# Patient Record
Sex: Female | Born: 1950 | Race: Black or African American | Hispanic: No | Smoking: Former smoker
Health system: Southern US, Community
[De-identification: ages and names within clinical notes are randomized; demographics above are authoritative.]

## PROBLEM LIST (undated history)

## (undated) ENCOUNTER — Emergency Department (HOSPITAL_COMMUNITY): Discharge status: Absent without leave | Payer: Medicare Other

## (undated) DIAGNOSIS — I313 Pericardial effusion (noninflammatory): Secondary | ICD-10-CM

## (undated) DIAGNOSIS — G4733 Obstructive sleep apnea (adult) (pediatric): Secondary | ICD-10-CM

## (undated) DIAGNOSIS — G4700 Insomnia, unspecified: Secondary | ICD-10-CM

## (undated) DIAGNOSIS — F329 Major depressive disorder, single episode, unspecified: Secondary | ICD-10-CM

## (undated) DIAGNOSIS — E039 Hypothyroidism, unspecified: Secondary | ICD-10-CM

## (undated) DIAGNOSIS — E876 Hypokalemia: Secondary | ICD-10-CM

## (undated) DIAGNOSIS — K648 Other hemorrhoids: Secondary | ICD-10-CM

## (undated) DIAGNOSIS — I421 Obstructive hypertrophic cardiomyopathy: Secondary | ICD-10-CM

## (undated) DIAGNOSIS — D649 Anemia, unspecified: Secondary | ICD-10-CM

## (undated) DIAGNOSIS — I3139 Other pericardial effusion (noninflammatory): Secondary | ICD-10-CM

## (undated) DIAGNOSIS — M1990 Unspecified osteoarthritis, unspecified site: Secondary | ICD-10-CM

## (undated) DIAGNOSIS — J9801 Acute bronchospasm: Secondary | ICD-10-CM

## (undated) DIAGNOSIS — H409 Unspecified glaucoma: Secondary | ICD-10-CM

## (undated) DIAGNOSIS — J449 Chronic obstructive pulmonary disease, unspecified: Secondary | ICD-10-CM

## (undated) DIAGNOSIS — I10 Essential (primary) hypertension: Secondary | ICD-10-CM

## (undated) DIAGNOSIS — K5790 Diverticulosis of intestine, part unspecified, without perforation or abscess without bleeding: Secondary | ICD-10-CM

## (undated) DIAGNOSIS — K219 Gastro-esophageal reflux disease without esophagitis: Secondary | ICD-10-CM

## (undated) DIAGNOSIS — N390 Urinary tract infection, site not specified: Secondary | ICD-10-CM

## (undated) DIAGNOSIS — E119 Type 2 diabetes mellitus without complications: Secondary | ICD-10-CM

## (undated) DIAGNOSIS — M4310 Spondylolisthesis, site unspecified: Secondary | ICD-10-CM

## (undated) DIAGNOSIS — I5030 Unspecified diastolic (congestive) heart failure: Secondary | ICD-10-CM

## (undated) DIAGNOSIS — M2550 Pain in unspecified joint: Secondary | ICD-10-CM

## (undated) DIAGNOSIS — I319 Disease of pericardium, unspecified: Secondary | ICD-10-CM

## (undated) DIAGNOSIS — E785 Hyperlipidemia, unspecified: Secondary | ICD-10-CM

## (undated) HISTORY — DX: Essential (primary) hypertension: I10

## (undated) HISTORY — DX: Type 2 diabetes mellitus without complications: E11.9

## (undated) HISTORY — DX: Major depressive disorder, single episode, unspecified: F32.9

## (undated) HISTORY — DX: Hypothyroidism, unspecified: E03.9

## (undated) HISTORY — DX: Other pericardial effusion (noninflammatory): I31.39

## (undated) HISTORY — DX: Hyperlipidemia, unspecified: E78.5

## (undated) HISTORY — DX: Disease of pericardium, unspecified: I31.9

## (undated) HISTORY — DX: Acute bronchospasm: J98.01

## (undated) HISTORY — DX: Other hemorrhoids: K64.8

## (undated) HISTORY — DX: Diverticulosis of intestine, part unspecified, without perforation or abscess without bleeding: K57.90

## (undated) HISTORY — DX: Obstructive sleep apnea (adult) (pediatric): G47.33

## (undated) HISTORY — DX: Obstructive hypertrophic cardiomyopathy: I42.1

## (undated) HISTORY — DX: Gastro-esophageal reflux disease without esophagitis: K21.9

## (undated) HISTORY — DX: Spondylolisthesis, site unspecified: M43.10

## (undated) HISTORY — DX: Hypokalemia: E87.6

## (undated) HISTORY — DX: Insomnia, unspecified: G47.00

## (undated) HISTORY — DX: Unspecified diastolic (congestive) heart failure: I50.30

## (undated) HISTORY — DX: Hypomagnesemia: E83.42

## (undated) HISTORY — DX: Anemia, unspecified: D64.9

## (undated) HISTORY — DX: Pain in unspecified joint: M25.50

## (undated) HISTORY — DX: Pericardial effusion (noninflammatory): I31.3

## (undated) HISTORY — DX: Unspecified osteoarthritis, unspecified site: M19.90

---

## 2002-12-07 ENCOUNTER — Ambulatory Visit (HOSPITAL_COMMUNITY): Admission: RE | Admit: 2002-12-07 | Discharge: 2002-12-07 | Payer: Self-pay | Admitting: *Deleted

## 2003-02-16 ENCOUNTER — Encounter: Payer: Self-pay | Admitting: Internal Medicine

## 2003-02-16 ENCOUNTER — Encounter: Admission: RE | Admit: 2003-02-16 | Discharge: 2003-02-16 | Payer: Self-pay | Admitting: Internal Medicine

## 2003-03-15 ENCOUNTER — Ambulatory Visit (HOSPITAL_BASED_OUTPATIENT_CLINIC_OR_DEPARTMENT_OTHER): Admission: RE | Admit: 2003-03-15 | Discharge: 2003-03-15 | Payer: Self-pay | Admitting: Orthopedic Surgery

## 2003-03-15 ENCOUNTER — Ambulatory Visit (HOSPITAL_COMMUNITY): Admission: RE | Admit: 2003-03-15 | Discharge: 2003-03-15 | Payer: Self-pay | Admitting: Orthopedic Surgery

## 2003-05-13 HISTORY — PX: OTHER SURGICAL HISTORY: SHX169

## 2003-05-25 ENCOUNTER — Encounter: Payer: Self-pay | Admitting: Internal Medicine

## 2003-07-10 ENCOUNTER — Encounter: Admission: RE | Admit: 2003-07-10 | Discharge: 2003-07-10 | Payer: Self-pay | Admitting: Internal Medicine

## 2003-10-20 ENCOUNTER — Encounter: Admission: RE | Admit: 2003-10-20 | Discharge: 2003-10-20 | Payer: Self-pay | Admitting: Internal Medicine

## 2003-11-22 ENCOUNTER — Inpatient Hospital Stay (HOSPITAL_COMMUNITY): Admission: RE | Admit: 2003-11-22 | Discharge: 2003-11-27 | Payer: Self-pay | Admitting: Orthopedic Surgery

## 2003-11-27 ENCOUNTER — Inpatient Hospital Stay (HOSPITAL_COMMUNITY)
Admission: RE | Admit: 2003-11-27 | Discharge: 2003-12-06 | Payer: Self-pay | Admitting: Physical Medicine & Rehabilitation

## 2004-01-04 ENCOUNTER — Encounter: Admission: RE | Admit: 2004-01-04 | Discharge: 2004-03-28 | Payer: Self-pay | Admitting: Orthopedic Surgery

## 2004-03-14 ENCOUNTER — Ambulatory Visit (HOSPITAL_COMMUNITY): Admission: RE | Admit: 2004-03-14 | Discharge: 2004-03-14 | Payer: Self-pay | Admitting: Internal Medicine

## 2004-03-14 ENCOUNTER — Ambulatory Visit: Payer: Self-pay | Admitting: Internal Medicine

## 2004-04-05 ENCOUNTER — Encounter: Admission: RE | Admit: 2004-04-05 | Discharge: 2004-04-05 | Payer: Self-pay | Admitting: Internal Medicine

## 2004-04-16 ENCOUNTER — Ambulatory Visit: Payer: Self-pay | Admitting: Internal Medicine

## 2004-06-06 ENCOUNTER — Ambulatory Visit: Payer: Self-pay | Admitting: Internal Medicine

## 2004-06-10 ENCOUNTER — Encounter: Admission: RE | Admit: 2004-06-10 | Discharge: 2004-06-10 | Payer: Self-pay | Admitting: Internal Medicine

## 2004-08-14 ENCOUNTER — Ambulatory Visit: Payer: Self-pay | Admitting: Internal Medicine

## 2004-08-20 ENCOUNTER — Ambulatory Visit: Payer: Self-pay | Admitting: Internal Medicine

## 2004-09-03 ENCOUNTER — Ambulatory Visit: Payer: Self-pay | Admitting: Internal Medicine

## 2004-10-22 ENCOUNTER — Ambulatory Visit: Payer: Self-pay | Admitting: Internal Medicine

## 2004-11-01 ENCOUNTER — Encounter: Payer: Self-pay | Admitting: Internal Medicine

## 2005-01-01 ENCOUNTER — Ambulatory Visit: Payer: Self-pay | Admitting: Internal Medicine

## 2005-01-30 ENCOUNTER — Encounter: Admission: RE | Admit: 2005-01-30 | Discharge: 2005-01-30 | Payer: Self-pay | Admitting: Internal Medicine

## 2005-02-17 ENCOUNTER — Ambulatory Visit: Payer: Self-pay | Admitting: Internal Medicine

## 2005-02-25 ENCOUNTER — Ambulatory Visit: Payer: Self-pay | Admitting: Cardiology

## 2005-03-11 ENCOUNTER — Encounter: Payer: Self-pay | Admitting: Internal Medicine

## 2005-03-11 ENCOUNTER — Ambulatory Visit: Payer: Self-pay

## 2005-03-13 ENCOUNTER — Ambulatory Visit: Payer: Self-pay

## 2005-04-01 ENCOUNTER — Encounter: Payer: Self-pay | Admitting: Internal Medicine

## 2005-04-01 ENCOUNTER — Ambulatory Visit: Payer: Self-pay | Admitting: Cardiology

## 2005-05-02 ENCOUNTER — Encounter: Admission: RE | Admit: 2005-05-02 | Discharge: 2005-05-02 | Payer: Self-pay | Admitting: Internal Medicine

## 2005-05-20 ENCOUNTER — Ambulatory Visit: Payer: Self-pay | Admitting: Internal Medicine

## 2005-05-23 ENCOUNTER — Ambulatory Visit: Payer: Self-pay | Admitting: Internal Medicine

## 2005-05-26 ENCOUNTER — Other Ambulatory Visit: Admission: RE | Admit: 2005-05-26 | Discharge: 2005-05-26 | Payer: Self-pay | Admitting: Family Medicine

## 2005-05-26 ENCOUNTER — Ambulatory Visit: Payer: Self-pay | Admitting: Family Medicine

## 2005-08-21 ENCOUNTER — Ambulatory Visit: Payer: Self-pay | Admitting: Internal Medicine

## 2005-09-10 ENCOUNTER — Encounter: Admission: RE | Admit: 2005-09-10 | Discharge: 2005-09-10 | Payer: Self-pay | Admitting: Internal Medicine

## 2005-09-11 ENCOUNTER — Encounter: Payer: Self-pay | Admitting: Internal Medicine

## 2005-09-18 ENCOUNTER — Encounter: Admission: RE | Admit: 2005-09-18 | Discharge: 2005-09-18 | Payer: Self-pay | Admitting: Family Medicine

## 2005-11-20 ENCOUNTER — Encounter: Payer: Self-pay | Admitting: Internal Medicine

## 2005-12-01 ENCOUNTER — Ambulatory Visit: Payer: Self-pay | Admitting: Internal Medicine

## 2005-12-19 ENCOUNTER — Ambulatory Visit: Payer: Self-pay | Admitting: Endocrinology

## 2006-01-02 ENCOUNTER — Ambulatory Visit (HOSPITAL_COMMUNITY): Admission: RE | Admit: 2006-01-02 | Discharge: 2006-01-02 | Payer: Self-pay | Admitting: Endocrinology

## 2006-01-02 ENCOUNTER — Ambulatory Visit: Payer: Self-pay | Admitting: Endocrinology

## 2006-01-14 ENCOUNTER — Ambulatory Visit: Payer: Self-pay | Admitting: Endocrinology

## 2006-02-13 ENCOUNTER — Ambulatory Visit: Payer: Self-pay | Admitting: Endocrinology

## 2006-02-24 ENCOUNTER — Ambulatory Visit: Payer: Self-pay | Admitting: Endocrinology

## 2006-04-07 ENCOUNTER — Ambulatory Visit: Payer: Self-pay | Admitting: Endocrinology

## 2006-04-28 ENCOUNTER — Encounter: Admission: RE | Admit: 2006-04-28 | Discharge: 2006-04-28 | Payer: Self-pay | Admitting: Specialist

## 2006-05-07 ENCOUNTER — Encounter: Admission: RE | Admit: 2006-05-07 | Discharge: 2006-05-07 | Payer: Self-pay | Admitting: Internal Medicine

## 2006-05-13 ENCOUNTER — Ambulatory Visit: Payer: Self-pay | Admitting: Internal Medicine

## 2006-05-20 ENCOUNTER — Ambulatory Visit: Payer: Self-pay | Admitting: Endocrinology

## 2006-05-25 ENCOUNTER — Ambulatory Visit: Payer: Self-pay | Admitting: Internal Medicine

## 2006-06-08 ENCOUNTER — Ambulatory Visit: Payer: Self-pay | Admitting: Internal Medicine

## 2006-06-18 ENCOUNTER — Encounter: Admission: RE | Admit: 2006-06-18 | Discharge: 2006-06-18 | Payer: Self-pay | Admitting: Internal Medicine

## 2006-06-30 ENCOUNTER — Ambulatory Visit: Payer: Self-pay | Admitting: Endocrinology

## 2006-07-28 ENCOUNTER — Ambulatory Visit: Payer: Self-pay | Admitting: Internal Medicine

## 2006-08-11 ENCOUNTER — Ambulatory Visit: Payer: Self-pay | Admitting: Internal Medicine

## 2006-08-11 LAB — CONVERTED CEMR LAB
Basophils Absolute: 0 10*3/uL (ref 0.0–0.1)
Basophils Relative: 0.6 % (ref 0.0–1.0)
CO2: 30 meq/L (ref 19–32)
Cholesterol: 149 mg/dL (ref 0–200)
Creatinine, Ser: 0.8 mg/dL (ref 0.4–1.2)
Glucose, Bld: 192 mg/dL — ABNORMAL HIGH (ref 70–99)
HCT: 40.5 % (ref 36.0–46.0)
Hemoglobin: 13.5 g/dL (ref 12.0–15.0)
LDL Cholesterol: 77 mg/dL (ref 0–99)
MCHC: 33.4 g/dL (ref 30.0–36.0)
Monocytes Absolute: 0.4 10*3/uL (ref 0.2–0.7)
Neutrophils Relative %: 58.7 % (ref 43.0–77.0)
Potassium: 4 meq/L (ref 3.5–5.1)
RDW: 14 % (ref 11.5–14.6)
Sodium: 139 meq/L (ref 135–145)

## 2006-08-12 ENCOUNTER — Ambulatory Visit: Payer: Self-pay | Admitting: Endocrinology

## 2006-08-17 ENCOUNTER — Encounter (INDEPENDENT_AMBULATORY_CARE_PROVIDER_SITE_OTHER): Payer: Self-pay | Admitting: *Deleted

## 2006-08-17 ENCOUNTER — Ambulatory Visit: Payer: Self-pay | Admitting: Internal Medicine

## 2006-08-28 ENCOUNTER — Encounter: Payer: Self-pay | Admitting: Internal Medicine

## 2006-08-28 ENCOUNTER — Ambulatory Visit: Payer: Self-pay | Admitting: Internal Medicine

## 2006-09-02 ENCOUNTER — Encounter: Payer: Self-pay | Admitting: Family Medicine

## 2006-09-02 ENCOUNTER — Other Ambulatory Visit: Admission: RE | Admit: 2006-09-02 | Discharge: 2006-09-02 | Payer: Self-pay | Admitting: Family Medicine

## 2006-09-02 ENCOUNTER — Ambulatory Visit: Payer: Self-pay | Admitting: Family Medicine

## 2006-09-04 ENCOUNTER — Encounter: Payer: Self-pay | Admitting: Internal Medicine

## 2006-09-21 ENCOUNTER — Ambulatory Visit: Payer: Self-pay | Admitting: Gastroenterology

## 2006-10-06 ENCOUNTER — Ambulatory Visit: Payer: Self-pay | Admitting: Gastroenterology

## 2006-10-06 ENCOUNTER — Encounter: Payer: Self-pay | Admitting: Family Medicine

## 2006-10-06 DIAGNOSIS — I10 Essential (primary) hypertension: Secondary | ICD-10-CM | POA: Insufficient documentation

## 2006-10-06 DIAGNOSIS — M1990 Unspecified osteoarthritis, unspecified site: Secondary | ICD-10-CM

## 2006-10-06 DIAGNOSIS — E119 Type 2 diabetes mellitus without complications: Secondary | ICD-10-CM

## 2006-10-06 HISTORY — DX: Type 2 diabetes mellitus without complications: E11.9

## 2006-10-06 HISTORY — DX: Unspecified osteoarthritis, unspecified site: M19.90

## 2006-10-06 HISTORY — DX: Essential (primary) hypertension: I10

## 2006-10-06 LAB — HM COLONOSCOPY

## 2006-10-17 ENCOUNTER — Encounter: Payer: Self-pay | Admitting: Family Medicine

## 2006-10-17 ENCOUNTER — Emergency Department (HOSPITAL_COMMUNITY): Admission: EM | Admit: 2006-10-17 | Discharge: 2006-10-17 | Payer: Self-pay | Admitting: Emergency Medicine

## 2006-10-20 ENCOUNTER — Ambulatory Visit: Payer: Self-pay | Admitting: Endocrinology

## 2006-11-10 ENCOUNTER — Ambulatory Visit: Payer: Self-pay | Admitting: Internal Medicine

## 2006-11-10 DIAGNOSIS — M2550 Pain in unspecified joint: Secondary | ICD-10-CM

## 2006-11-10 HISTORY — DX: Pain in unspecified joint: M25.50

## 2006-11-10 LAB — CONVERTED CEMR LAB
ANA Titer 1: 1:40 {titer} — ABNORMAL HIGH
Anti Nuclear Antibody(ANA): POSITIVE — AB

## 2006-11-18 ENCOUNTER — Ambulatory Visit: Payer: Self-pay | Admitting: Endocrinology

## 2006-11-19 ENCOUNTER — Encounter: Payer: Self-pay | Admitting: Internal Medicine

## 2006-11-19 ENCOUNTER — Encounter: Admission: RE | Admit: 2006-11-19 | Discharge: 2007-02-17 | Payer: Self-pay | Admitting: Internal Medicine

## 2006-11-20 LAB — CONVERTED CEMR LAB: TSH: 2.17 microintl units/mL (ref 0.35–5.50)

## 2006-11-26 ENCOUNTER — Encounter: Payer: Self-pay | Admitting: Internal Medicine

## 2006-12-21 ENCOUNTER — Ambulatory Visit: Payer: Self-pay | Admitting: Endocrinology

## 2006-12-21 LAB — CONVERTED CEMR LAB: Hgb A1c MFr Bld: 7.2 % — ABNORMAL HIGH (ref 4.6–6.0)

## 2007-02-12 ENCOUNTER — Encounter: Payer: Self-pay | Admitting: Internal Medicine

## 2007-03-15 ENCOUNTER — Ambulatory Visit: Payer: Self-pay | Admitting: Internal Medicine

## 2007-03-16 LAB — CONVERTED CEMR LAB
BUN: 11 mg/dL (ref 6–23)
CO2: 28 meq/L (ref 19–32)
Calcium: 9.8 mg/dL (ref 8.4–10.5)
Chloride: 103 meq/L (ref 96–112)
Creatinine, Ser: 0.9 mg/dL (ref 0.4–1.2)
GFR calc Af Amer: 83 mL/min
GFR calc non Af Amer: 69 mL/min
Glucose, Bld: 59 mg/dL — ABNORMAL LOW (ref 70–99)
Potassium: 3.2 meq/L — ABNORMAL LOW (ref 3.5–5.1)
Sodium: 141 meq/L (ref 135–145)

## 2007-04-05 ENCOUNTER — Ambulatory Visit: Payer: Self-pay | Admitting: Internal Medicine

## 2007-04-06 LAB — CONVERTED CEMR LAB: Potassium: 3.3 meq/L — ABNORMAL LOW (ref 3.5–5.1)

## 2007-04-20 ENCOUNTER — Ambulatory Visit: Payer: Self-pay | Admitting: Internal Medicine

## 2007-04-26 LAB — CONVERTED CEMR LAB
Creatinine, Ser: 1 mg/dL (ref 0.4–1.2)
GFR calc non Af Amer: 61 mL/min
Glucose, Bld: 88 mg/dL (ref 70–99)
Potassium: 3.5 meq/L (ref 3.5–5.1)
Sodium: 137 meq/L (ref 135–145)

## 2007-05-03 ENCOUNTER — Ambulatory Visit: Payer: Self-pay | Admitting: Endocrinology

## 2007-05-03 LAB — CONVERTED CEMR LAB: Hgb A1c MFr Bld: 6.3 % — ABNORMAL HIGH (ref 4.6–6.0)

## 2007-05-10 ENCOUNTER — Encounter: Admission: RE | Admit: 2007-05-10 | Discharge: 2007-05-10 | Payer: Self-pay | Admitting: Internal Medicine

## 2007-06-07 ENCOUNTER — Ambulatory Visit: Payer: Self-pay | Admitting: Internal Medicine

## 2007-06-23 ENCOUNTER — Encounter: Admission: RE | Admit: 2007-06-23 | Discharge: 2007-07-14 | Payer: Self-pay | Admitting: Orthopedic Surgery

## 2007-07-09 ENCOUNTER — Ambulatory Visit: Payer: Self-pay | Admitting: Endocrinology

## 2007-07-11 HISTORY — PX: OTHER SURGICAL HISTORY: SHX169

## 2007-07-14 ENCOUNTER — Telehealth: Payer: Self-pay | Admitting: Internal Medicine

## 2007-07-20 ENCOUNTER — Inpatient Hospital Stay (HOSPITAL_COMMUNITY): Admission: RE | Admit: 2007-07-20 | Discharge: 2007-07-27 | Payer: Self-pay | Admitting: Orthopedic Surgery

## 2007-08-06 ENCOUNTER — Encounter: Payer: Self-pay | Admitting: Internal Medicine

## 2007-09-06 ENCOUNTER — Encounter: Payer: Self-pay | Admitting: Internal Medicine

## 2007-09-13 ENCOUNTER — Encounter: Admission: RE | Admit: 2007-09-13 | Discharge: 2007-12-07 | Payer: Self-pay | Admitting: Orthopedic Surgery

## 2007-09-27 ENCOUNTER — Encounter: Payer: Self-pay | Admitting: Internal Medicine

## 2007-10-12 ENCOUNTER — Encounter: Payer: Self-pay | Admitting: Internal Medicine

## 2007-11-01 ENCOUNTER — Telehealth (INDEPENDENT_AMBULATORY_CARE_PROVIDER_SITE_OTHER): Payer: Self-pay | Admitting: *Deleted

## 2007-11-01 ENCOUNTER — Ambulatory Visit: Payer: Self-pay | Admitting: Endocrinology

## 2007-11-18 ENCOUNTER — Ambulatory Visit: Payer: Self-pay | Admitting: Internal Medicine

## 2007-11-18 DIAGNOSIS — R079 Chest pain, unspecified: Secondary | ICD-10-CM

## 2007-11-22 ENCOUNTER — Ambulatory Visit: Payer: Self-pay | Admitting: Internal Medicine

## 2007-11-23 ENCOUNTER — Encounter (INDEPENDENT_AMBULATORY_CARE_PROVIDER_SITE_OTHER): Payer: Self-pay | Admitting: *Deleted

## 2007-12-07 ENCOUNTER — Ambulatory Visit: Payer: Self-pay | Admitting: Internal Medicine

## 2007-12-07 ENCOUNTER — Ambulatory Visit: Payer: Self-pay | Admitting: Cardiology

## 2007-12-08 ENCOUNTER — Telehealth (INDEPENDENT_AMBULATORY_CARE_PROVIDER_SITE_OTHER): Payer: Self-pay | Admitting: *Deleted

## 2007-12-08 ENCOUNTER — Inpatient Hospital Stay (HOSPITAL_COMMUNITY): Admission: EM | Admit: 2007-12-08 | Discharge: 2007-12-09 | Payer: Self-pay | Admitting: Emergency Medicine

## 2007-12-08 ENCOUNTER — Encounter: Payer: Self-pay | Admitting: Internal Medicine

## 2007-12-09 ENCOUNTER — Encounter: Payer: Self-pay | Admitting: Internal Medicine

## 2007-12-15 ENCOUNTER — Encounter (INDEPENDENT_AMBULATORY_CARE_PROVIDER_SITE_OTHER): Payer: Self-pay | Admitting: *Deleted

## 2007-12-15 ENCOUNTER — Ambulatory Visit: Payer: Self-pay | Admitting: Internal Medicine

## 2007-12-17 ENCOUNTER — Ambulatory Visit: Payer: Self-pay | Admitting: Cardiology

## 2007-12-21 ENCOUNTER — Telehealth: Payer: Self-pay | Admitting: Internal Medicine

## 2007-12-30 ENCOUNTER — Telehealth: Payer: Self-pay | Admitting: Internal Medicine

## 2008-01-03 ENCOUNTER — Encounter: Admission: RE | Admit: 2008-01-03 | Discharge: 2008-01-03 | Payer: Self-pay | Admitting: Internal Medicine

## 2008-01-05 ENCOUNTER — Telehealth (INDEPENDENT_AMBULATORY_CARE_PROVIDER_SITE_OTHER): Payer: Self-pay | Admitting: *Deleted

## 2008-01-06 ENCOUNTER — Encounter: Payer: Self-pay | Admitting: Internal Medicine

## 2008-01-06 ENCOUNTER — Ambulatory Visit: Payer: Self-pay

## 2008-01-11 ENCOUNTER — Ambulatory Visit: Payer: Self-pay

## 2008-01-25 ENCOUNTER — Ambulatory Visit: Payer: Self-pay | Admitting: Endocrinology

## 2008-01-26 ENCOUNTER — Ambulatory Visit: Payer: Self-pay | Admitting: Cardiovascular Disease

## 2008-01-27 ENCOUNTER — Ambulatory Visit: Payer: Self-pay | Admitting: Cardiology

## 2008-01-27 LAB — CONVERTED CEMR LAB
BUN: 16 mg/dL (ref 6–23)
Basophils Absolute: 0 10*3/uL (ref 0.0–0.1)
Basophils Relative: 0.7 % (ref 0.0–3.0)
CO2: 29 meq/L (ref 19–32)
Calcium: 9.5 mg/dL (ref 8.4–10.5)
Chloride: 99 meq/L (ref 96–112)
Creatinine, Ser: 0.8 mg/dL (ref 0.4–1.2)
Eosinophils Relative: 2.4 % (ref 0.0–5.0)
Glucose, Bld: 79 mg/dL (ref 70–99)
Hemoglobin: 12.8 g/dL (ref 12.0–15.0)
Lymphocytes Relative: 32.7 % (ref 12.0–46.0)
MCHC: 33.2 g/dL (ref 30.0–36.0)
Neutro Abs: 3.5 10*3/uL (ref 1.4–7.7)
Prothrombin Time: 12 s (ref 10.9–13.3)
RBC: 4.26 M/uL (ref 3.87–5.11)
WBC: 6.1 10*3/uL (ref 4.5–10.5)

## 2008-01-28 ENCOUNTER — Ambulatory Visit: Payer: Self-pay | Admitting: Internal Medicine

## 2008-02-01 ENCOUNTER — Ambulatory Visit: Payer: Self-pay | Admitting: Cardiovascular Disease

## 2008-02-01 ENCOUNTER — Ambulatory Visit (HOSPITAL_COMMUNITY): Admission: RE | Admit: 2008-02-01 | Discharge: 2008-02-01 | Payer: Self-pay | Admitting: Cardiovascular Disease

## 2008-02-09 ENCOUNTER — Telehealth (INDEPENDENT_AMBULATORY_CARE_PROVIDER_SITE_OTHER): Payer: Self-pay | Admitting: *Deleted

## 2008-02-10 ENCOUNTER — Encounter: Payer: Self-pay | Admitting: Internal Medicine

## 2008-02-11 ENCOUNTER — Ambulatory Visit: Payer: Self-pay | Admitting: Internal Medicine

## 2008-03-17 ENCOUNTER — Ambulatory Visit: Payer: Self-pay | Admitting: Cardiology

## 2008-04-11 ENCOUNTER — Telehealth (INDEPENDENT_AMBULATORY_CARE_PROVIDER_SITE_OTHER): Payer: Self-pay | Admitting: *Deleted

## 2008-05-08 ENCOUNTER — Encounter (INDEPENDENT_AMBULATORY_CARE_PROVIDER_SITE_OTHER): Payer: Self-pay | Admitting: *Deleted

## 2008-05-23 ENCOUNTER — Ambulatory Visit: Payer: Self-pay | Admitting: Endocrinology

## 2008-05-23 LAB — CONVERTED CEMR LAB: Vitamin B-12: 351 pg/mL (ref 211–911)

## 2008-05-26 ENCOUNTER — Telehealth (INDEPENDENT_AMBULATORY_CARE_PROVIDER_SITE_OTHER): Payer: Self-pay | Admitting: *Deleted

## 2008-05-29 ENCOUNTER — Ambulatory Visit: Payer: Self-pay | Admitting: Internal Medicine

## 2008-05-30 ENCOUNTER — Encounter: Admission: RE | Admit: 2008-05-30 | Discharge: 2008-05-30 | Payer: Self-pay | Admitting: Internal Medicine

## 2008-06-02 ENCOUNTER — Encounter (INDEPENDENT_AMBULATORY_CARE_PROVIDER_SITE_OTHER): Payer: Self-pay | Admitting: *Deleted

## 2008-06-12 ENCOUNTER — Telehealth (INDEPENDENT_AMBULATORY_CARE_PROVIDER_SITE_OTHER): Payer: Self-pay | Admitting: *Deleted

## 2008-06-13 ENCOUNTER — Telehealth (INDEPENDENT_AMBULATORY_CARE_PROVIDER_SITE_OTHER): Payer: Self-pay | Admitting: *Deleted

## 2008-06-23 ENCOUNTER — Ambulatory Visit: Payer: Self-pay | Admitting: Pulmonary Disease

## 2008-06-23 ENCOUNTER — Telehealth (INDEPENDENT_AMBULATORY_CARE_PROVIDER_SITE_OTHER): Payer: Self-pay | Admitting: *Deleted

## 2008-06-23 ENCOUNTER — Encounter: Payer: Self-pay | Admitting: Endocrinology

## 2008-06-23 DIAGNOSIS — G4733 Obstructive sleep apnea (adult) (pediatric): Secondary | ICD-10-CM | POA: Insufficient documentation

## 2008-06-23 HISTORY — DX: Obstructive sleep apnea (adult) (pediatric): G47.33

## 2008-07-07 ENCOUNTER — Telehealth (INDEPENDENT_AMBULATORY_CARE_PROVIDER_SITE_OTHER): Payer: Self-pay | Admitting: *Deleted

## 2008-07-10 ENCOUNTER — Encounter: Payer: Self-pay | Admitting: Internal Medicine

## 2008-07-10 ENCOUNTER — Telehealth (INDEPENDENT_AMBULATORY_CARE_PROVIDER_SITE_OTHER): Payer: Self-pay | Admitting: *Deleted

## 2008-07-11 ENCOUNTER — Ambulatory Visit (HOSPITAL_BASED_OUTPATIENT_CLINIC_OR_DEPARTMENT_OTHER): Admission: RE | Admit: 2008-07-11 | Discharge: 2008-07-11 | Payer: Self-pay | Admitting: Pulmonary Disease

## 2008-07-11 ENCOUNTER — Encounter: Payer: Self-pay | Admitting: Pulmonary Disease

## 2008-07-12 ENCOUNTER — Encounter: Payer: Self-pay | Admitting: Internal Medicine

## 2008-07-13 ENCOUNTER — Telehealth: Payer: Self-pay | Admitting: Endocrinology

## 2008-07-22 ENCOUNTER — Ambulatory Visit: Payer: Self-pay | Admitting: Pulmonary Disease

## 2008-07-24 ENCOUNTER — Telehealth (INDEPENDENT_AMBULATORY_CARE_PROVIDER_SITE_OTHER): Payer: Self-pay | Admitting: *Deleted

## 2008-08-01 ENCOUNTER — Ambulatory Visit: Payer: Self-pay | Admitting: Pulmonary Disease

## 2008-08-22 ENCOUNTER — Ambulatory Visit: Payer: Self-pay | Admitting: Endocrinology

## 2008-08-22 LAB — CONVERTED CEMR LAB: Hgb A1c MFr Bld: 7 % — ABNORMAL HIGH (ref 4.6–6.5)

## 2008-08-28 ENCOUNTER — Encounter: Payer: Self-pay | Admitting: Internal Medicine

## 2008-09-01 ENCOUNTER — Encounter: Payer: Self-pay | Admitting: Internal Medicine

## 2008-09-04 ENCOUNTER — Ambulatory Visit: Payer: Self-pay | Admitting: Pulmonary Disease

## 2008-09-20 ENCOUNTER — Encounter: Payer: Self-pay | Admitting: Internal Medicine

## 2008-09-26 ENCOUNTER — Ambulatory Visit: Payer: Self-pay | Admitting: Internal Medicine

## 2008-09-26 DIAGNOSIS — F329 Major depressive disorder, single episode, unspecified: Secondary | ICD-10-CM

## 2008-09-26 DIAGNOSIS — G4700 Insomnia, unspecified: Secondary | ICD-10-CM

## 2008-09-26 DIAGNOSIS — F3289 Other specified depressive episodes: Secondary | ICD-10-CM

## 2008-09-26 HISTORY — DX: Other specified depressive episodes: F32.89

## 2008-09-26 HISTORY — DX: Insomnia, unspecified: G47.00

## 2008-09-26 HISTORY — DX: Major depressive disorder, single episode, unspecified: F32.9

## 2008-09-27 ENCOUNTER — Encounter: Payer: Self-pay | Admitting: Internal Medicine

## 2008-09-27 ENCOUNTER — Encounter: Admission: RE | Admit: 2008-09-27 | Discharge: 2008-09-27 | Payer: Self-pay | Admitting: Internal Medicine

## 2008-10-04 ENCOUNTER — Ambulatory Visit: Payer: Self-pay | Admitting: Pulmonary Disease

## 2008-11-23 ENCOUNTER — Ambulatory Visit: Payer: Self-pay | Admitting: Endocrinology

## 2008-11-23 LAB — CONVERTED CEMR LAB: Hgb A1c MFr Bld: 6.7 % — ABNORMAL HIGH (ref 4.6–6.5)

## 2008-11-27 ENCOUNTER — Telehealth: Payer: Self-pay | Admitting: Endocrinology

## 2008-12-05 ENCOUNTER — Ambulatory Visit: Payer: Self-pay | Admitting: Internal Medicine

## 2008-12-11 ENCOUNTER — Encounter: Payer: Self-pay | Admitting: Endocrinology

## 2008-12-25 ENCOUNTER — Encounter (INDEPENDENT_AMBULATORY_CARE_PROVIDER_SITE_OTHER): Payer: Self-pay | Admitting: *Deleted

## 2009-01-11 ENCOUNTER — Other Ambulatory Visit: Admission: RE | Admit: 2009-01-11 | Discharge: 2009-01-11 | Payer: Self-pay | Admitting: Family Medicine

## 2009-01-11 ENCOUNTER — Encounter: Payer: Self-pay | Admitting: Family Medicine

## 2009-01-11 ENCOUNTER — Ambulatory Visit: Payer: Self-pay | Admitting: Family Medicine

## 2009-01-11 DIAGNOSIS — E785 Hyperlipidemia, unspecified: Secondary | ICD-10-CM | POA: Insufficient documentation

## 2009-01-11 HISTORY — DX: Hyperlipidemia, unspecified: E78.5

## 2009-01-17 ENCOUNTER — Encounter (INDEPENDENT_AMBULATORY_CARE_PROVIDER_SITE_OTHER): Payer: Self-pay | Admitting: *Deleted

## 2009-01-17 ENCOUNTER — Telehealth (INDEPENDENT_AMBULATORY_CARE_PROVIDER_SITE_OTHER): Payer: Self-pay | Admitting: *Deleted

## 2009-01-18 ENCOUNTER — Encounter (INDEPENDENT_AMBULATORY_CARE_PROVIDER_SITE_OTHER): Payer: Self-pay | Admitting: *Deleted

## 2009-01-23 ENCOUNTER — Telehealth (INDEPENDENT_AMBULATORY_CARE_PROVIDER_SITE_OTHER): Payer: Self-pay | Admitting: *Deleted

## 2009-01-24 ENCOUNTER — Telehealth (INDEPENDENT_AMBULATORY_CARE_PROVIDER_SITE_OTHER): Payer: Self-pay | Admitting: *Deleted

## 2009-01-27 ENCOUNTER — Encounter: Payer: Self-pay | Admitting: Family Medicine

## 2009-02-02 ENCOUNTER — Ambulatory Visit: Payer: Self-pay | Admitting: Family Medicine

## 2009-02-23 ENCOUNTER — Ambulatory Visit: Payer: Self-pay | Admitting: Endocrinology

## 2009-02-26 LAB — CONVERTED CEMR LAB
Creatinine,U: 93.2 mg/dL
Hgb A1c MFr Bld: 7 % — ABNORMAL HIGH (ref 4.6–6.5)
Microalb Creat Ratio: 4.3 mg/g (ref 0.0–30.0)

## 2009-04-02 ENCOUNTER — Ambulatory Visit: Payer: Self-pay | Admitting: Pulmonary Disease

## 2009-04-04 ENCOUNTER — Encounter: Payer: Self-pay | Admitting: Pulmonary Disease

## 2009-04-10 ENCOUNTER — Ambulatory Visit: Payer: Self-pay | Admitting: Internal Medicine

## 2009-05-01 ENCOUNTER — Ambulatory Visit: Payer: Self-pay | Admitting: Family Medicine

## 2009-05-07 ENCOUNTER — Encounter: Payer: Self-pay | Admitting: Internal Medicine

## 2009-05-14 ENCOUNTER — Encounter (INDEPENDENT_AMBULATORY_CARE_PROVIDER_SITE_OTHER): Payer: Self-pay | Admitting: *Deleted

## 2009-05-19 ENCOUNTER — Encounter: Payer: Self-pay | Admitting: Pulmonary Disease

## 2009-06-11 ENCOUNTER — Ambulatory Visit: Payer: Self-pay | Admitting: Endocrinology

## 2009-06-21 ENCOUNTER — Encounter: Admission: RE | Admit: 2009-06-21 | Discharge: 2009-06-21 | Payer: Self-pay | Admitting: Internal Medicine

## 2009-08-06 ENCOUNTER — Ambulatory Visit: Payer: Self-pay | Admitting: Internal Medicine

## 2009-08-06 DIAGNOSIS — L989 Disorder of the skin and subcutaneous tissue, unspecified: Secondary | ICD-10-CM | POA: Insufficient documentation

## 2009-08-10 LAB — CONVERTED CEMR LAB
ALT: 18 units/L (ref 0–35)
BUN: 24 mg/dL — ABNORMAL HIGH (ref 6–23)
Basophils Relative: 1 % (ref 0.0–3.0)
Chloride: 103 meq/L (ref 96–112)
Creatinine, Ser: 1.2 mg/dL (ref 0.4–1.2)
Eosinophils Absolute: 0.1 10*3/uL (ref 0.0–0.7)
Eosinophils Relative: 1.2 % (ref 0.0–5.0)
GFR calc non Af Amer: 58.87 mL/min (ref >60)
Glucose, Bld: 69 mg/dL — ABNORMAL LOW (ref 70–99)
HCT: 36 % (ref 36.0–46.0)
Lymphs Abs: 1.6 10*3/uL (ref 0.7–4.0)
MCHC: 31.8 g/dL (ref 30.0–36.0)
MCV: 92.8 fL (ref 78.0–100.0)
Monocytes Absolute: 0.4 10*3/uL (ref 0.1–1.0)
Potassium: 3.8 meq/L (ref 3.5–5.1)
RBC: 3.88 M/uL (ref 3.87–5.11)
WBC: 5.3 10*3/uL (ref 4.5–10.5)

## 2009-08-14 ENCOUNTER — Encounter: Payer: Self-pay | Admitting: Internal Medicine

## 2009-09-10 ENCOUNTER — Ambulatory Visit: Payer: Self-pay | Admitting: Endocrinology

## 2009-09-20 ENCOUNTER — Encounter: Payer: Self-pay | Admitting: Internal Medicine

## 2009-09-20 ENCOUNTER — Encounter: Payer: Self-pay | Admitting: Endocrinology

## 2009-09-20 LAB — HM DIABETES EYE EXAM: HM Diabetic Eye Exam: NORMAL

## 2009-10-04 ENCOUNTER — Ambulatory Visit: Payer: Self-pay | Admitting: Internal Medicine

## 2009-12-10 ENCOUNTER — Ambulatory Visit: Payer: Self-pay | Admitting: Internal Medicine

## 2009-12-10 DIAGNOSIS — D649 Anemia, unspecified: Secondary | ICD-10-CM

## 2009-12-10 HISTORY — DX: Anemia, unspecified: D64.9

## 2009-12-17 ENCOUNTER — Ambulatory Visit: Payer: Self-pay | Admitting: Endocrinology

## 2009-12-17 LAB — CONVERTED CEMR LAB
BUN: 23 mg/dL (ref 6–23)
CO2: 29 meq/L (ref 19–32)
Chloride: 107 meq/L (ref 96–112)
Glucose, Bld: 96 mg/dL (ref 70–99)
Potassium: 4.1 meq/L (ref 3.5–5.1)
Sodium: 142 meq/L (ref 135–145)

## 2010-01-07 ENCOUNTER — Encounter: Payer: Self-pay | Admitting: Pulmonary Disease

## 2010-01-10 ENCOUNTER — Telehealth: Payer: Self-pay | Admitting: Internal Medicine

## 2010-01-16 ENCOUNTER — Encounter: Payer: Self-pay | Admitting: Pulmonary Disease

## 2010-03-01 ENCOUNTER — Telehealth (INDEPENDENT_AMBULATORY_CARE_PROVIDER_SITE_OTHER): Payer: Self-pay | Admitting: *Deleted

## 2010-03-01 ENCOUNTER — Telehealth: Payer: Self-pay | Admitting: Endocrinology

## 2010-03-04 ENCOUNTER — Telehealth (INDEPENDENT_AMBULATORY_CARE_PROVIDER_SITE_OTHER): Payer: Self-pay | Admitting: *Deleted

## 2010-03-04 ENCOUNTER — Telehealth: Payer: Self-pay | Admitting: Endocrinology

## 2010-03-06 ENCOUNTER — Telehealth (INDEPENDENT_AMBULATORY_CARE_PROVIDER_SITE_OTHER): Payer: Self-pay | Admitting: *Deleted

## 2010-03-20 ENCOUNTER — Telehealth: Payer: Self-pay | Admitting: Endocrinology

## 2010-03-25 ENCOUNTER — Ambulatory Visit: Payer: Self-pay | Admitting: Endocrinology

## 2010-03-25 LAB — CONVERTED CEMR LAB: Hgb A1c MFr Bld: 7.9 % — ABNORMAL HIGH (ref 4.6–6.5)

## 2010-04-24 ENCOUNTER — Ambulatory Visit: Payer: Self-pay | Admitting: Internal Medicine

## 2010-05-08 ENCOUNTER — Ambulatory Visit: Payer: Self-pay | Admitting: Internal Medicine

## 2010-05-09 LAB — CONVERTED CEMR LAB
AST: 24 units/L (ref 0–37)
Cholesterol: 161 mg/dL (ref 0–200)
HDL: 58 mg/dL (ref >39.00)
LDL Cholesterol: 86 mg/dL (ref 0–99)
Triglycerides: 84 mg/dL (ref 0.0–149.0)
VLDL: 16.8 mg/dL (ref 0.0–40.0)

## 2010-05-21 ENCOUNTER — Encounter
Admission: RE | Admit: 2010-05-21 | Discharge: 2010-06-11 | Payer: Self-pay | Source: Home / Self Care | Attending: Internal Medicine | Admitting: Internal Medicine

## 2010-06-02 ENCOUNTER — Encounter: Payer: Self-pay | Admitting: Endocrinology

## 2010-06-04 ENCOUNTER — Other Ambulatory Visit: Payer: Self-pay | Admitting: Internal Medicine

## 2010-06-04 ENCOUNTER — Telehealth (INDEPENDENT_AMBULATORY_CARE_PROVIDER_SITE_OTHER): Payer: Self-pay | Admitting: *Deleted

## 2010-06-05 ENCOUNTER — Other Ambulatory Visit: Payer: Self-pay | Admitting: Internal Medicine

## 2010-06-05 DIAGNOSIS — N644 Mastodynia: Secondary | ICD-10-CM

## 2010-06-09 LAB — CONVERTED CEMR LAB
Albumin: 4 g/dL (ref 3.5–5.2)
Alkaline Phosphatase: 75 units/L (ref 39–117)
BUN: 21 mg/dL (ref 6–23)
CO2: 29 meq/L (ref 19–32)
Calcium: 9.4 mg/dL (ref 8.4–10.5)
Cholesterol: 157 mg/dL (ref 0–200)
Creatinine, Ser: 1 mg/dL (ref 0.4–1.2)
GFR calc non Af Amer: 72.79 mL/min (ref >60)
Glucose, Bld: 114 mg/dL — ABNORMAL HIGH (ref 70–99)
HDL: 57.1 mg/dL (ref >39.00)
Total Protein: 7.7 g/dL (ref 6.0–8.3)
Triglycerides: 88 mg/dL (ref 0.0–149.0)
VLDL: 17.6 mg/dL (ref 0.0–40.0)

## 2010-06-11 NOTE — Assessment & Plan Note (Signed)
Summary: 3 MTH FU  STC   Vital Signs:  Patient profile:   60 year old female Height:      69 inches (175.26 cm) Weight:      305.2 pounds (138.73 kg) O2 Sat:      98 % on Room air Temp:     98.2 degrees F (36.78 degrees C) oral Pulse rate:   82 / minute BP sitting:   110 / 72  (left arm) Cuff size:   large  Vitals Entered By: Orlan Leavens (Sep 10, 2009 10:48 AM)  O2 Flow:  Room air CC: 3 month follow-up Is Patient Diabetic? Yes Did you bring your meter with you today? Yes Pain Assessment Patient in pain? no        Referring Provider:  Everardo All Primary Provider:  Nolon Rod. Paz MD  CC:  3 month follow-up.  History of Present Illness: no cbg record.  she states cbg's are seldom low, but it happens in the afternoon, or in the middel of the night.  pt states she feels well in general, except for hand pain.  Current Medications (verified): 1)  Bayer Aspirin 325 Mg  Tabs (Aspirin) .... Qd 2)  Potassium Chloride Cr 10 Meq  Cpcr (Potassium Chloride) .Marland Kitchen.. 1 By Mouth Qd 3)  Carvedilol 25 Mg Tabs (Carvedilol) .Marland Kitchen.. 1 By Mouth Two Times A Day 4)  Taztia Xt 360 Mg Cp24 (Diltiazem Hcl Er Beads) .Marland Kitchen.. 1 By Mouth Once Daily 5)  Benazepril-Hydrochlorothiazide 20-12.5 Mg Tabs (Benazepril-Hydrochlorothiazide) .... 3 By Mouth Once Daily (In Am) 6)  Micardis 40 Mg  Tabs (Telmisartan) .... Qd 7)  Crestor 20 Mg Tabs (Rosuvastatin Calcium) .... Take 1 1/2 Tab Daily 8)  Glucophage 1000 Mg Tabs (Metformin Hcl) .Marland Kitchen.. 1 By Mouth Two Times A Day 9)  Bd Insulin Syringe Ultrafine 31g X 5/16" 0.3 Ml Misc (Insulin Syringe-Needle U-100) 10)  Insulin Pen .... Per Dr Everardo All 11)  Bd U/f Original Pen Needle 29g X 12.3mm  Misc (Insulin Pen Needle) .... Use As Directed 12)  Prodigy Blood Glucose Monitor  Devi (Blood Glucose Monitoring Suppl) .... As Dir 13)  Prodigy Blood Glucose Test  Strp (Glucose Blood) .... Two Times A Day, and Lancets 250.00 14)  Prodigy Twist Top Lancets 28g  Misc (Lancets) .... Test Three  Times A Day 15)  Humalog Mix 50/50 Kwikpen 50-50 % Susp (Insulin Lispro Prot & Lispro) .... 40 Units Two Times A Day 16)  Clonidine Hcl 0.2 Mg Tabs (Clonidine Hcl) .... Take 1 1/2 Tabs By Mouth Daily 17)  Protonix 40 Mg Tbec (Pantoprazole Sodium) .... Take 2 By Mouth Q Am As Needed  Allergies (verified): No Known Drug Allergies  Past History:  Past Medical History: Last updated: 08/06/2009 Severe OSA per sleep study 2010, Rx a CPAP PAIN IN JOINT, MULTIPLE SITES  DIABETES MELLITUS  DEGENERATIVE JOINT DISEASE  HYPERTENSION  2004--Cath neg, arterial LE dopplers neg 06-2003-- abd. u/s fatty liver 01-2005-- CP...neg u/s GB, neg HIDA 10-06--saw cards--cardiolite neg CP--abnormal stress test, Cath 9-09: normal 2006 Cscope --TICs Depression (Dx 09-2008)  Review of Systems  The patient denies syncope.    Physical Exam  General:  morbidly obese.  no distress  Skin:  insulin injection sites at anterior abdomen are normal  Additional Exam:  Hemoglobin A1C       [H]  6.8 %    Impression & Recommendations:  Problem # 1:  DIABETES MELLITUS (ICD-250.00) overcontrolled for this insulin regimen  Medications Added to  Medication List This Visit: 1)  Humalog Mix 50/50 Kwikpen 50-50 % Susp (Insulin lispro prot & lispro) .... 38 units two times a day 2)  Protonix 40 Mg Tbec (Pantoprazole sodium) .... Take 2 by mouth q am as needed  Other Orders: TLB-A1C / Hgb A1C (Glycohemoglobin) (83036-A1C) Est. Patient Level III ([address])  Patient Instructions: 1)  check your blood glucose 2 times a day.  vary the time of day between before the 3 meals and at bedtime.  also check if you feel as though your glucose might be very high or too low.  bring a record of this to your doctor appointments 2)  Please schedule a follow-up appointment in 3 months. 3)  tests are being ordered for you today.  a few days after the test(s), please call [phone] to hear your test results. 4)  pending the test results,  please reduce humalog 50/50 insulin to 38 units two times a day.

## 2010-06-11 NOTE — Assessment & Plan Note (Signed)
Summary: 3 MTH FU  STC   Vital Signs:  Patient profile:   60 year old female Height:      69 inches (175.26 cm) Weight:      306.50 pounds (139.32 kg) BMI:     45.43 O2 Sat:      95 % on Room air Temp:     98.3 degrees F (36.83 degrees C) oral Pulse rate:   88 / minute BP sitting:   102 / 68  (left arm) Cuff size:   regular  Vitals Entered By: Brenton Grills MA (December 17, 2009 8:53 AM)  O2 Flow:  Room air CC: 3 mo F/U/aj Is Patient Diabetic? Yes   Referring Provider:  Everardo All Primary Provider:  Nolon Rod. Paz MD  CC:  3 mo F/U/aj.  History of Present Illness: pt states she feels well in general, except her radicular sxs persist.  she brings a record of her cbg's which i have reviewed today.  she has mild hypoglycemia (50's) at any time of day.  it does not go over 200, and is seldom over 150.  there is no trend throughout the day.    Current Medications (verified): 1)  Bayer Aspirin 325 Mg  Tabs (Aspirin) .... Qd 2)  Potassium Chloride Cr 10 Meq  Cpcr (Potassium Chloride) .Marland Kitchen.. 1 By Mouth Qd 3)  Carvedilol 25 Mg Tabs (Carvedilol) .Marland Kitchen.. 1 By Mouth Two Times A Day 4)  Taztia Xt 360 Mg Cp24 (Diltiazem Hcl Er Beads) .Marland Kitchen.. 1 By Mouth Once Daily 5)  Benazepril-Hydrochlorothiazide 20-12.5 Mg Tabs (Benazepril-Hydrochlorothiazide) .... 3 By Mouth Once Daily (In Am) 6)  Micardis 40 Mg  Tabs (Telmisartan) .... Qd 7)  Crestor 20 Mg Tabs (Rosuvastatin Calcium) .... Take 1 1/2 Tab Daily 8)  Glucophage 1000 Mg Tabs (Metformin Hcl) .Marland Kitchen.. 1 By Mouth Two Times A Day 9)  Bd Insulin Syringe Ultrafine 31g X 5/16" 0.3 Ml Misc (Insulin Syringe-Needle U-100) 10)  Insulin Pen .... Per Dr Everardo All 11)  Bd U/f Original Pen Needle 29g X 12.16mm  Misc (Insulin Pen Needle) .... Use As Directed 12)  Prodigy Blood Glucose Monitor  Devi (Blood Glucose Monitoring Suppl) .... As Dir 13)  Prodigy Blood Glucose Test  Strp (Glucose Blood) .... Two Times A Day, and Lancets 250.00 14)  Prodigy Twist Top Lancets 28g   Misc (Lancets) .... Test Three Times A Day 15)  Humalog Mix 50/50 Kwikpen 50-50 % Susp (Insulin Lispro Prot & Lispro) .... 38 Units Two Times A Day 16)  Clonidine Hcl 0.2 Mg Tabs (Clonidine Hcl) .... Take 1 1/2 Tabs By Mouth Daily 17)  Protonix 40 Mg Tbec (Pantoprazole Sodium) .... Take 2 By Mouth Q Am As Needed  Allergies (verified): No Known Drug Allergies  Past History:  Past Medical History: Last updated: 12/10/2009 Severe OSA per sleep study 2010, Rx a CPAP DJD PAIN IN JOINT, MULTIPLE SITES  DIABETES MELLITUS   HYPERTENSION  2004--Cath neg, arterial LE dopplers neg 06-2003-- abd. u/s fatty liver 01-2005-- CP...neg u/s GB, neg HIDA 10-06--saw cards--cardiolite neg CP--abnormal stress test, Cath 9-09: normal 2006 Cscope --TICs Depression (Dx 09-2008)  Review of Systems  The patient denies syncope.    Physical Exam  General:  normal appearance.   Pulses:  dorsalis pedis intact bilat. Extremities:  no deformity.  no ulcer on the feet.  feet are of normal color and temp.   trace right pedal edema (none on left) mycotic toenails.   Neurologic:  sensation is intact to  touch on the feet.  Additional Exam:  Hemoglobin A1C       [H]  6.9 %       Impression & Recommendations:  Problem # 1:  DIABETES MELLITUS (ICD-250.00) overcontrolled  Other Orders: TLB-A1C / Hgb A1C (Glycohemoglobin) (83036-A1C) Est. Patient Level III ([address])  Patient Instructions: 1)  check your blood glucose 2 times a day.  vary the time of day between before the 3 meals and at bedtime.  also check if you feel as though your glucose might be very high or too low.  bring a record of this to your doctor appointments 2)  Please schedule a follow-up appointment in 3 months. 3)  tests are being ordered for you today.  a few days after the test(s), please call [phone] to hear your test results. 4)  pending the test results, please reduce humalog 50/50 insulin to 38 units two times a day. 5)  i have sent a  prescription for a new meter to your pharmacy. 6)  (update: i left message on phone-tree:  rx as we discussed) Prescriptions: PRODIGY BLOOD GLUCOSE MONITOR  DEVI (BLOOD GLUCOSE MONITORING SUPPL) as dir  #1 x 0   Entered and Authorized by:   Minus Breeding MD   Signed by:   Minus Breeding MD on 12/17/2009   Method used:   Electronically to        [facility] [phone]* (retail)       [address]       [place], [place]  [address]       Ph: [phone] or [phone]       Fax: [phone]   RxID:   [id number]

## 2010-06-11 NOTE — Assessment & Plan Note (Signed)
Summary: 4 month roa//lch   Vital Signs:  Patient profile:   60 year old female Weight:      304.50 pounds Pulse rate:   76 / minute Pulse rhythm:   regular BP sitting:   130 / 86  (left arm) Cuff size:   large  Vitals Entered By: Army Fossa CMA (December 10, 2009 12:49 PM) CC: Pt here for f/u visit: Not fasting   History of Present Illness: last office visit with me 07/2009, chart reviewed  She was seen by dermatology, diagnosed with skin tags, prescribe observation  Was referred to Dr. Charlann Boxer, orthopedic surgery, to be seen soon   Her last creatinine was slightly elevated,  hemoglobin slightly low, due for recheck  She saw Dr. Everardo All, diabetes remains well controlled with a hemoglobin A1c of less than 7  She was seen by pulmonary, she is intolerant to a CPAP for sleep apnea, needing oxygen at night to maintain her sats  Current Medications (verified): 1)  Bayer Aspirin 325 Mg  Tabs (Aspirin) .... Qd 2)  Potassium Chloride Cr 10 Meq  Cpcr (Potassium Chloride) .Marland Kitchen.. 1 By Mouth Qd 3)  Carvedilol 25 Mg Tabs (Carvedilol) .Marland Kitchen.. 1 By Mouth Two Times A Day 4)  Taztia Xt 360 Mg Cp24 (Diltiazem Hcl Er Beads) .Marland Kitchen.. 1 By Mouth Once Daily 5)  Benazepril-Hydrochlorothiazide 20-12.5 Mg Tabs (Benazepril-Hydrochlorothiazide) .... 3 By Mouth Once Daily (In Am) 6)  Micardis 40 Mg  Tabs (Telmisartan) .... Qd 7)  Crestor 20 Mg Tabs (Rosuvastatin Calcium) .... Take 1 1/2 Tab Daily 8)  Glucophage 1000 Mg Tabs (Metformin Hcl) .Marland Kitchen.. 1 By Mouth Two Times A Day 9)  Bd Insulin Syringe Ultrafine 31g X 5/16" 0.3 Ml Misc (Insulin Syringe-Needle U-100) 10)  Insulin Pen .... Per Dr Everardo All 11)  Bd U/f Original Pen Needle 29g X 12.19mm  Misc (Insulin Pen Needle) .... Use As Directed 12)  Prodigy Blood Glucose Monitor  Devi (Blood Glucose Monitoring Suppl) .... As Dir 13)  Prodigy Blood Glucose Test  Strp (Glucose Blood) .... Two Times A Day, and Lancets 250.00 14)  Prodigy Twist Top Lancets 28g  Misc  (Lancets) .... Test Three Times A Day 15)  Humalog Mix 50/50 Kwikpen 50-50 % Susp (Insulin Lispro Prot & Lispro) .... 38 Units Two Times A Day 16)  Clonidine Hcl 0.2 Mg Tabs (Clonidine Hcl) .... Take 1 1/2 Tabs By Mouth Daily 17)  Protonix 40 Mg Tbec (Pantoprazole Sodium) .... Take 2 By Mouth Q Am As Needed  Allergies (verified): No Known Drug Allergies  Past History:  Past Medical History: Severe OSA per sleep study 2010, Rx a CPAP DJD PAIN IN JOINT, MULTIPLE SITES  DIABETES MELLITUS   HYPERTENSION  2004--Cath neg, arterial LE dopplers neg 06-2003-- abd. u/s fatty liver 01-2005-- CP...neg u/s GB, neg HIDA 10-06--saw cards--cardiolite neg CP--abnormal stress test, Cath 9-09: normal 2006 Cscope --TICs Depression (Dx 09-2008)  Past Surgical History: Reviewed history from 12/15/2007 and no changes required. 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 Resp:  still SOB at times . GI:  Denies abdominal pain, bloody stools, diarrhea, nausea, and vomiting.  Physical Exam  General:  alert, well-developed, and overweight-appearing.   Lungs:  normal respiratory effort, no intercostal retractions, no accessory  muscle use, and normal breath sounds.   Heart:  normal rate, regular rhythm, and no murmur.   Extremities:  both ankles are slightly  swollen     Impression & Recommendations:  Problem # 1:  UNSPECIFIED ANEMIA (ICD-285.9) last Hg slightly  decreased the last time we checked, labs   Orders: TLB-IBC Pnl (Iron/FE;Transferrin) (83550-IBC) TLB-Hemoglobin (Hgb) (85018-HGB) Venipuncture ([address]) Specimen Handling (60454)  Problem # 2:  DIABETES MELLITUS (ICD-250.00) well controlled  Her updated medication list for this problem includes:     Bayer Aspirin 325 Mg Tabs (Aspirin) ..... Qd    Benazepril-hydrochlorothiazide 20-12.5 Mg Tabs (Benazepril-hydrochlorothiazide) .Marland KitchenMarland KitchenMarland KitchenMarland Kitchen 3 by mouth once daily (in am)    Micardis 40 Mg Tabs (Telmisartan) ..... Qd    Glucophage 1000 Mg Tabs (Metformin hcl) .Marland Kitchen... 1 by mouth two times a day    Humalog Mix 50/50 Kwikpen 50-50 % Susp (Insulin lispro prot & lispro) .Marland KitchenMarland KitchenMarland KitchenMarland Kitchen 38 units two times a day  Problem # 3:  HYPERTENSION (ICD-401.9) well controlled  last Creat slightly  elevated --recheck BMP  Her updated medication list for this problem includes:    Carvedilol 25 Mg Tabs (Carvedilol) .Marland Kitchen... 1 by mouth two times a day    Taztia Xt 360 Mg Cp24 (Diltiazem hcl er beads) .Marland Kitchen... 1 by mouth once daily    Benazepril-hydrochlorothiazide 20-12.5 Mg Tabs (Benazepril-hydrochlorothiazide) .Marland KitchenMarland KitchenMarland KitchenMarland Kitchen 3 by mouth once daily (in am)    Micardis 40 Mg Tabs (Telmisartan) ..... Qd    Clonidine Hcl 0.2 Mg Tabs (Clonidine hcl) .Marland Kitchen... Take 1 1/2 tabs by mouth daily    BP today: 130/86 Prior BP: 120/78 (10/04/2009)  Labs Reviewed: K+: 3.8 (08/06/2009) Creat: : 1.2 (08/06/2009)   Chol: 157 (01/11/2009)   HDL: 57.10 (01/11/2009)   LDL: 82 (01/11/2009)   TG: 88.0 (01/11/2009)  Orders: TLB-BMP (Basic Metabolic Panel-BMET) (80048-METABOL) Specimen Handling (09811)  Complete Medication List: 1)  Bayer Aspirin 325 Mg Tabs (Aspirin) .... Qd 2)  Potassium Chloride Cr 10 Meq Cpcr (Potassium chloride) .Marland Kitchen.. 1 by mouth qd 3)  Carvedilol 25 Mg Tabs (Carvedilol) .Marland Kitchen.. 1 by mouth two times a day 4)  Taztia Xt 360 Mg Cp24 (Diltiazem hcl er beads) .Marland Kitchen.. 1 by mouth once daily 5)  Benazepril-hydrochlorothiazide 20-12.5 Mg Tabs (Benazepril-hydrochlorothiazide) .... 3 by mouth once daily (in am) 6)  Micardis 40 Mg Tabs (Telmisartan) .... Qd 7)  Crestor 20 Mg Tabs (Rosuvastatin calcium) .... Take 1 1/2 tab daily 8)  Glucophage 1000 Mg Tabs (Metformin hcl) .Marland Kitchen.. 1 by mouth two times a day 9)  Bd Insulin Syringe Ultrafine 31g X 5/16" 0.3 Ml  Misc (Insulin syringe-needle u-100) 10)  Insulin Pen  .... Per dr Everardo All 11)  Bd U/f Original Pen Needle 29g X 12.54mm Misc (Insulin pen needle) .... Use as directed 12)  Prodigy Blood Glucose Monitor Devi (Blood glucose monitoring suppl) .... As dir 13)  Prodigy Blood Glucose Test Strp (Glucose blood) .... Two times a day, and lancets 250.00 14)  Prodigy Twist Top Lancets 28g Misc (Lancets) .... Test three times a day 15)  Humalog Mix 50/50 Kwikpen 50-50 % Susp (Insulin lispro prot & lispro) .... 38 units two times a day 16)  Clonidine Hcl 0.2 Mg Tabs (Clonidine hcl) .... Take 1 1/2 tabs by mouth daily 17)  Protonix 40 Mg Tbec (Pantoprazole sodium) .... Take 2 by mouth q am as needed  Patient Instructions: 1)  Please schedule a follow-up appointment in 4 to 5  months .

## 2010-06-11 NOTE — Progress Notes (Signed)
Summary: refill  Phone Note Refill Request Message from:  Fax from Pharmacy on March 06, 2010 8:46 AM  Refills Requested: Medication #1:  BENAZEPRIL-HYDROCHLOROTHIAZIDE 20-12.5 MG TABS 3 by mouth once daily (in AM) [facility] home delivery - fax [phone]  Initial call taken by: Okey Regal [place],  March 06, 2010 8:49 AM    Prescriptions: BENAZEPRIL-HYDROCHLOROTHIAZIDE 20-12.5 MG TABS (BENAZEPRIL-HYDROCHLOROTHIAZIDE) 3 by mouth once daily (in AM)  #90 Tablet x 2   Entered by:   Army Fossa CMA   Authorized by:   Nolon Rod. Paz MD   Signed by:   Army Fossa CMA on 03/06/2010   Method used:   Faxed to ...       [address] [facility] (mail-order)       [address]       [place], [place]  [address]       Ph: [phone]       Fax: [phone]   RxID:   [id number]

## 2010-06-11 NOTE — Progress Notes (Signed)
Summary: REFILL REQUEST  Phone Note Refill Request Message from:  Patient on March 01, 2010 2:56 PM  Refills Requested: Medication #1:  CRESTOR 20 MG TABS take 1 1/2 tab daily   Dosage confirmed as above?Dosage Confirmed   Supply Requested: 3 months [facility]  Initial call taken by: Lavell Islam,  March 01, 2010 2:57 PM    Prescriptions: CRESTOR 20 MG TABS (ROSUVASTATIN CALCIUM) take 1 1/2 tab daily  #135 x 1   Entered by:   Army Fossa CMA   Authorized by:   Nolon Rod. Paz MD   Signed by:   Army Fossa CMA on 03/01/2010   Method used:   Faxed to ...       [address] [facility] (mail-order)       [address]       [place], [place]  [address]       Ph: [phone]       Fax: [phone]   RxID:   [id number]

## 2010-06-11 NOTE — Progress Notes (Signed)
Summary: refill  Phone Note Refill Request Message from:  Fax from Pharmacy on March 06, 2010 9:12 AM  Refills Requested: Medication #1:  CLONIDINE HCL 0.2 MG TABS take 1 1/2 tabs by mouth daily  Medication #2:  KLOR-CON TAB **M**10MEQ-G-> [facility] Delivery - fax [phone]  Initial call taken by: Okey Regal [place],  March 06, 2010 9:17 AM    Prescriptions: CLONIDINE HCL 0.2 MG TABS (CLONIDINE HCL) take 1 1/2 tabs by mouth daily  #180 x 1   Entered by:   Army Fossa CMA   Authorized by:   Nolon Rod. Paz MD   Signed by:   Army Fossa CMA on 03/06/2010   Method used:   Faxed to ...       [address] [facility] (mail-order)       [address]       [place], [place]  [address]       Ph: [phone]       Fax: [phone]   RxID:   [id number] POTASSIUM CHLORIDE CR 10 MEQ  CPCR (POTASSIUM CHLORIDE) 1 by mouth qd  #90 x 1   Entered by:   Army Fossa CMA   Authorized by:   Nolon Rod. Paz MD   Signed by:   Army Fossa CMA on 03/06/2010   Method used:   Faxed to ...       [address] [facility] (mail-order)       [address]       [place], [place]  [address]       Ph: [phone]       Fax: [phone]   RxID:   [id number]

## 2010-06-11 NOTE — Assessment & Plan Note (Signed)
Summary: cpx/ns/kdc   Vital Signs:  Patient profile:   60 year old female Height:      69 inches Weight:      308.6 pounds BMI:     45.74 Pulse rate:   83 / minute BP sitting:   105 / 70  Vitals Entered By: Shary Decamp (August 06, 2009 9:02 AM) CC: cpx - fasting   History of Present Illness: Severe OSA-- not using CPAP consistently  PAIN IN JOINT, MULTIPLE SITES --continue with pain in her hands, mostly on the left and, all fingers o involved, besides pain she also has numbness   DM-- per Dr Everardo All   HYPERTENSION -- no ambulatory BPs   has several skin lesions, would like them out, they get irritated at times  Preventive Screening-Counseling & Management  Caffeine-Diet-Exercise     Caffeine use/day: 0     Does Patient Exercise: yes     Type of exercise: walk     Times/week: 2      Drug Use:  no.    Current Medications (verified): 1)  Bayer Aspirin 325 Mg  Tabs (Aspirin) .... Qd 2)  Potassium Chloride Cr 10 Meq  Cpcr (Potassium Chloride) .Marland Kitchen.. 1 By Mouth Qd 3)  Carvedilol 25 Mg Tabs (Carvedilol) .Marland Kitchen.. 1 By Mouth Two Times A Day 4)  Taztia Xt 360 Mg Cp24 (Diltiazem Hcl Er Beads) .Marland Kitchen.. 1 By Mouth Once Daily 5)  Benazepril-Hydrochlorothiazide 20-12.5 Mg Tabs (Benazepril-Hydrochlorothiazide) .... 3 By Mouth Once Daily (In Am) 6)  Micardis 40 Mg  Tabs (Telmisartan) .... Qd 7)  Crestor 20 Mg Tabs (Rosuvastatin Calcium) .... Take 1 1/2 Tab Daily 8)  Glucophage 1000 Mg Tabs (Metformin Hcl) .Marland Kitchen.. 1 By Mouth Two Times A Day 9)  Bd Insulin Syringe Ultrafine 31g X 5/16" 0.3 Ml Misc (Insulin Syringe-Needle U-100) 10)  Insulin Pen .... Per Dr Everardo All 11)  Bd U/f Original Pen Needle 29g X 12.3mm  Misc (Insulin Pen Needle) .... Use As Directed 12)  Prodigy Blood Glucose Monitor  Devi (Blood Glucose Monitoring Suppl) .... As Dir 13)  Prodigy Blood Glucose Test  Strp (Glucose Blood) .... Two Times A Day, and Lancets 250.00 14)  Prodigy Twist Top Lancets 28g  Misc (Lancets) .... Test  Three Times A Day 15)  Humalog Mix 50/50 Kwikpen 50-50 % Susp (Insulin Lispro Prot & Lispro) .... 40 Units Two Times A Day 16)  Clonidine Hcl 0.2 Mg Tabs (Clonidine Hcl) .... Take 1 1/2 Tabs By Mouth Daily  Allergies (verified): No Known Drug Allergies  Past History:  Past Medical History: Severe OSA per sleep study 2010, Rx a CPAP PAIN IN JOINT, MULTIPLE SITES  DIABETES MELLITUS  DEGENERATIVE JOINT DISEASE  HYPERTENSION  2004--Cath neg, arterial LE dopplers neg 06-2003-- abd. u/s fatty liver 01-2005-- CP...neg u/s GB, neg HIDA 10-06--saw cards--cardiolite neg CP--abnormal stress test, Cath 9-09: normal 2006 Cscope --TICs Depression (Dx 09-2008)  Past Surgical History: Reviewed history from 12/15/2007 and no changes required. 2005  -R- Knne replacecement 3-09 TKR left  Family History: Reviewed history from 10/06/2006 and no changes required. Asthma-- M Diabetes 1st degree relative-- M, S, B Hypertension-- S , B  Stroke -- M MI--no breast ca-- no colon ca-- no pancreatic ca-- B  Social History: widow 4 kids tobacco-- no ETOH-- rarely not working, on disability Drug Use:  no Caffeine use/day:  0 Does Patient Exercise:  yes  Review of Systems       no nausea, vomiting, diarrhea. Saw blood  in her stools last month one time in the context of severe constipation "still get chest pain from time to time" "still get short of breath from time to time" denies cough lost his brother to pancreatic cancer, still grieving  Physical Exam  General:  alert and well-developed.   Lungs:  Normal respiratory effort, chest expands symmetrically. Lungs are clear to auscultation, no crackles or wheezes. Heart:  normal rate, regular rhythm, and no murmur.   Abdomen:  soft, non-tender, no distention, no masses, no guarding, and no rigidity.   Msk:  no puffy at wrist, + swelling in the 3th  L PIP Extremities:  calves symetric both ankles are slightly  swollen   Skin:  several  skin lesions in the neck and one in the left cheek, likely skin tags Inguinal Nodes:  No significant adenopathy   Impression & Recommendations:  Problem # 1:  SKIN LESION (ICD-709.9) to dermatology  Orders: Dermatology Referral (Derma)  Problem # 2:  HEALTH SCREENING (ICD-V70.0) chart reviewed  Td 08 Cscope 2005, 2008, tics, hemorrhoids ----> next in 2015 negative mammogram 2/11 negative Pap smear 9/10 normal DEXA  in the arm  ( unable to do a regular DEXA  due to pt's weight)  5/10    Problem # 3:  HYPERLIPIDEMIA (ICD-272.4) at goal  Her updated medication list for this problem includes:    Crestor 20 Mg Tabs (Rosuvastatin calcium) .Marland Kitchen... Take 1 1/2 tab daily  Labs Reviewed: SGOT: 17 (01/11/2009)   SGPT: 14 (01/11/2009)   HDL:57.10 (01/11/2009), 49.8 (08/11/2006)  LDL:82 (01/11/2009), 77 (08/11/2006)  Chol:157 (01/11/2009), 149 (08/11/2006)  Trig:88.0 (01/11/2009), 109 (08/11/2006)  Orders: TLB-ALT (SGPT) (84460-ALT) TLB-AST (SGOT) (84450-SGOT) TLB-TSH (Thyroid Stimulating Hormone) (84443-TSH)  Problem # 4:  DEPRESSION (ICD-311) counseled, not sure if she likmes to take any meds, pt will let me know  Problem # 5:  HYPERTENSION (ICD-401.9) BP is slightly low today, usually okay, no change Her updated medication list for this problem includes:    Carvedilol 25 Mg Tabs (Carvedilol) .Marland Kitchen... 1 by mouth two times a day    Taztia Xt 360 Mg Cp24 (Diltiazem hcl er beads) .Marland Kitchen... 1 by mouth once daily    Benazepril-hydrochlorothiazide 20-12.5 Mg Tabs (Benazepril-hydrochlorothiazide) .Marland KitchenMarland KitchenMarland KitchenMarland Kitchen 3 by mouth once daily (in am)    Micardis 40 Mg Tabs (Telmisartan) ..... Qd    Clonidine Hcl 0.2 Mg Tabs (Clonidine hcl) .Marland Kitchen... 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 to Dr Charlann Boxer   Orders: Orthopedic Referral (Ortho)  Problem # 7:  DIABETES MELLITUS (ICD-250.00) per Dr. Everardo All Her updated medication list for this problem includes:    Bayer Aspirin 325 Mg Tabs (Aspirin) ..... Qd    Benazepril-hydrochlorothiazide 20-12.5 Mg Tabs (Benazepril-hydrochlorothiazide) .Marland KitchenMarland KitchenMarland KitchenMarland Kitchen 3 by mouth once daily (in am)    Micardis 40 Mg Tabs (Telmisartan) ..... Qd    Glucophage 1000 Mg Tabs (Metformin hcl) .Marland Kitchen... 1 by mouth two times a day    Humalog Mix 50/50 Kwikpen 50-50 % Susp (Insulin lispro prot & lispro) .Marland KitchenMarland KitchenMarland KitchenMarland Kitchen 40 units two  times a day  Complete Medication List: 1)  Bayer Aspirin 325 Mg Tabs (Aspirin) .... Qd 2)  Potassium Chloride Cr 10 Meq Cpcr (Potassium chloride) .Marland Kitchen.. 1 by mouth qd 3)  Carvedilol 25 Mg Tabs (Carvedilol) .Marland Kitchen.. 1 by mouth two times a day 4)  Taztia Xt 360 Mg Cp24 (Diltiazem hcl er beads) .Marland Kitchen.. 1 by mouth once daily 5)  Benazepril-hydrochlorothiazide 20-12.5 Mg Tabs (Benazepril-hydrochlorothiazide) .... 3 by mouth once daily (in am) 6)  Micardis 40 Mg Tabs (Telmisartan) .... Qd 7)  Crestor 20 Mg Tabs (Rosuvastatin calcium) .... Take 1 1/2 tab daily 8)  Glucophage 1000 Mg Tabs (Metformin hcl) .Marland Kitchen.. 1 by mouth two times a day 9)  Bd Insulin Syringe Ultrafine 31g X 5/16" 0.3 Ml Misc (Insulin syringe-needle u-100) 10)  Insulin Pen  .... Per dr Everardo All 11)  Bd U/f Original Pen Needle 29g X 12.52mm Misc (Insulin pen needle) .... Use as directed 12)  Prodigy Blood Glucose Monitor Devi (Blood glucose monitoring suppl) .... As dir 13)  Prodigy Blood Glucose Test Strp (Glucose blood) .... Two times a day, and lancets 250.00 14)  Prodigy Twist Top Lancets 28g Misc (Lancets) .... Test three times a  day 15)  Humalog Mix 50/50 Kwikpen 50-50 % Susp (Insulin lispro prot & lispro) .... 40 units two times a day 16)  Clonidine Hcl 0.2 Mg Tabs (Clonidine hcl) .... Take 1 1/2 tabs by mouth daily  Patient Instructions: 1)  Please schedule a follow-up appointment in 4 months .    Risk Factors:  Drug use:  no Caffeine use:  0 drinks per day    Comments:  drinks about every other week Exercise:  yes    Times per week:  2    Type:  walk

## 2010-06-11 NOTE — Letter (Signed)
Summary: Cancer Screening/[facility] Personalized Risk Profile  Cancer Screening/[facility] Personalized Risk Profile   Imported By: Lanelle Bal 08/10/2009 09:20:41  _____________________________________________________________________  External Attachment:    Type:   Image     Comment:   External Document

## 2010-06-11 NOTE — Letter (Signed)
Summary: [facility]  [facility]   Imported By: Lester [place] 09/27/2009 09:50:55  _____________________________________________________________________  External Attachment:    Type:   Image     Comment:   External Document

## 2010-06-11 NOTE — Miscellaneous (Signed)
Summary: neg DM eye exam   Clinical Lists Changes  Observations: Added new observation of DMEYEEXAMNXT: 09/2010 (09/20/2009 15:43) Added new observation of DMEYEEXMRES: normal (09/20/2009 15:43) Added new observation of EYE EXAM BY: [facility] (09/20/2009 15:43) Added new observation of DIAB EYE EX: normal (09/20/2009 15:43)       Diabetes Management Exam:    Eye Exam:       Eye Exam done elsewhere          Date: 09/20/2009          Results: normal          Done by: [facility]

## 2010-06-11 NOTE — Progress Notes (Signed)
Summary: REFILL  Phone Note Refill Request Call back at [phone] Message from:  Pharmacy on March 06, 2010 9:02 AM  Refills Requested: Medication #1:  GLUCOPHAGE 1000 MG TABS 1 by mouth two times a day   Dosage confirmed as above?Dosage Confirmed   Supply Requested: 3 months [facility]  Initial call taken by: Lavell Islam,  March 06, 2010 9:04 AM    Prescriptions: GLUCOPHAGE 1000 MG TABS (METFORMIN HCL) 1 by mouth two times a day  #180 x 0   Entered by:   Army Fossa CMA   Authorized by:   Nolon Rod. Paz MD   Signed by:   Army Fossa CMA on 03/06/2010   Method used:   Faxed to ...       [address] [facility] (mail-order)       [address]       [place], [place]  [address]       Ph: [phone]       Fax: [phone]   RxID:   [id number]

## 2010-06-11 NOTE — Letter (Signed)
Summary: CMN for Oxygen/HCS  CMN for Oxygen/HCS   Imported By: Sherian Rein 05/24/2009 14:45:06  _____________________________________________________________________  External Attachment:    Type:   Image     Comment:   External Document

## 2010-06-11 NOTE — Procedures (Signed)
Summary: Oximetry/HCS  Oximetry/HCS   Imported By: Lester Henlawson 01/22/2010 11:28:52  _____________________________________________________________________  External Attachment:    Type:   Image     Comment:   External Document

## 2010-06-11 NOTE — Progress Notes (Signed)
Summary: rx refill req  Phone Note Refill Request Message from:  Fax from Pharmacy on March 04, 2010 11:40 AM  Refills Requested: Medication #1:  MICARDIS 40 MG  TABS qd   Dosage confirmed as above?Dosage Confirmed [facility] 90 day supply   Method Requested: Fax to [facility] Next Appointment Scheduled: 03/25/2010 Initial call taken by: Brenton Grills MA,  March 04, 2010 11:41 AM    Prescriptions: MICARDIS 40 MG  TABS (TELMISARTAN) qd  #90 tablet x 1   Entered by:   Brenton Grills MA   Authorized by:   Minus Breeding MD   Signed by:   Brenton Grills MA on 03/04/2010   Method used:   Faxed to ...       [address] [facility] (mail-order)       [address]       [place], [place]  [address]       Ph: [phone]       Fax: [phone]   RxID:   [id number]

## 2010-06-11 NOTE — Letter (Signed)
Summary: Results Follow up Letter  [facility] [place]  [address] [place], [place] [address]   Phone: [phone]  Fax: [phone]    05/14/2009 MRN: [id number]  LAURY HUIZAR [address]  [place], [place]  [address]  Dear Ms. Kluever,  The following are the results of your recent test(s):  Test         Result    Pap Smear:        Normal _____  Not Normal _____ Comments: ______________________________________________________ Cholesterol: LDL(Bad cholesterol):         Your goal is less than:         HDL (Good cholesterol):       Your goal is more than: Comments:  ______________________________________________________ Mammogram:        Normal _____  Not Normal _____ Comments:  ___________________________________________________________________ Hemoccult:        Normal _____  Not normal _______ Comments:    _____________________________________________________________________ Other Tests:  See attachment for results.   We routinely do not discuss normal results over the telephone.  If you desire a copy of the results, or you have any questions about this information we can discuss them at your next office visit.   Sincerely,    Army Fossa CMA  May 14, 2009 8:18 AM

## 2010-06-11 NOTE — Assessment & Plan Note (Signed)
Summary: 3 mos f/u # /cd   Vital Signs:  Patient profile:   60 year old female Height:      69 inches (175.26 cm) Weight:      303.25 pounds (137.84 kg) BMI:     44.94 O2 Sat:      99 % on Room air Temp:     98.4 degrees F (36.89 degrees C) oral Pulse rate:   84 / minute BP sitting:   124 / 76  (left arm) Cuff size:   large  Vitals Entered By: Brenton Grills CMA Duncan Dull) (March 25, 2010 10:46 AM)  O2 Flow:  Room air CC: 3 month F/U/aj Is Patient Diabetic? Yes   Referring Provider:  Everardo All Primary Provider:  Nolon Rod. Paz MD  CC:  3 month F/U/aj.  History of Present Illness: pt reports myalgias, but she otherwise feels well.  she brings a record of her cbg's which i have reviewed today.  it vaires form 100-200, with no trend thoughout the day.  she had 1 episode of mild hypoglycemia in the middle of the night.    Current Medications (verified): 1)  Bayer Aspirin 325 Mg  Tabs (Aspirin) .... Qd 2)  Potassium Chloride Cr 10 Meq  Cpcr (Potassium Chloride) .Marland Kitchen.. 1 By Mouth Qd 3)  Carvedilol 25 Mg Tabs (Carvedilol) .Marland Kitchen.. 1 By Mouth Two Times A Day 4)  Taztia Xt 360 Mg Cp24 (Diltiazem Hcl Er Beads) .Marland Kitchen.. 1 By Mouth Once Daily 5)  Benazepril-Hydrochlorothiazide 20-12.5 Mg Tabs (Benazepril-Hydrochlorothiazide) .... 3 By Mouth Once Daily (In Am) 6)  Micardis 40 Mg  Tabs (Telmisartan) .... Qd 7)  Crestor 20 Mg Tabs (Rosuvastatin Calcium) .... Take 1 1/2 Tab Daily 8)  Glucophage 1000 Mg Tabs (Metformin Hcl) .Marland Kitchen.. 1 By Mouth Two Times A Day 9)  Bd Insulin Syringe Ultrafine 31g X 5/16" 0.3 Ml Misc (Insulin Syringe-Needle U-100) 10)  Insulin Pen .... Per Dr Everardo All 11)  Bd U/f Original Pen Needle 29g X 12.72mm  Misc (Insulin Pen Needle) .... Use As Directed 12)  Prodigy Blood Glucose Monitor  Devi (Blood Glucose Monitoring Suppl) .... As Dir 13)  Prodigy Blood Glucose Test  Strp (Glucose Blood) .... Two Times A Day, and Lancets 250.00 14)  Prodigy Twist Top Lancets 28g  Misc (Lancets) ....  Test Three Times A Day 15)  Humalog Mix 50/50 Kwikpen 50-50 % Susp (Insulin Lispro Prot & Lispro) .... 38 Units Two Times A Day 16)  Clonidine Hcl 0.2 Mg Tabs (Clonidine Hcl) .... Take 1 1/2 Tabs By Mouth Daily 17)  Protonix 40 Mg Tbec (Pantoprazole Sodium) .... Take 2 By Mouth Q Am As Needed  Allergies (verified): No Known Drug Allergies  Past History:  Past Medical History: Last updated: 12/10/2009 Severe OSA per sleep study 2010, Rx a CPAP DJD PAIN IN JOINT, MULTIPLE SITES  DIABETES MELLITUS   HYPERTENSION  2004--Cath neg, arterial LE dopplers neg 06-2003-- abd. u/s fatty liver 01-2005-- CP...neg u/s GB, neg HIDA 10-06--saw cards--cardiolite neg CP--abnormal stress test, Cath 9-09: normal 2006 Cscope --TICs Depression (Dx 09-2008)  Review of Systems  The patient denies syncope.    Physical Exam  General:  normal appearance.   Neck:  Supple without thyroid enlargement or tenderness.  Additional Exam:   Hemoglobin A1C       [H]  7.9 %       Impression & Recommendations:  Problem # 1:  DIABETES MELLITUS (ICD-250.00) this is the best control this pt should aim for, given  this regimen, which does match insulin to her changing needs throughout the day  Medications Added to Medication List This Visit: 1)  Prodigy Blood Glucose Test Strp (Glucose blood) .... Two times a day, and lancets 250.00  Other Orders: TLB-A1C / Hgb A1C (Glycohemoglobin) (83036-A1C) Est. Patient Level III ([address])  Patient Instructions: 1)  check your blood glucose 2 times a day.  vary the time of day between before the 3 meals and at bedtime.  also check if you feel as though your glucose might be very high or too low.  bring a record of this to your doctor appointments. 2)  Please schedule a follow-up appointment in 3 months. 3)  tests are being ordered for you today.  please call [phone] to hear your test results. 4)  pending the test results, please continue humalog 50/50 insulin 38 units two  times a day. 5)  i have sent a prescription for strips and lancets to your pharmacy 6)  (update: i left message on phone-tree:  rx as we discussed) Prescriptions: PRODIGY BLOOD GLUCOSE TEST  STRP (GLUCOSE BLOOD) two times a day, and lancets 250.00  #180 x 3   Entered and Authorized by:   Minus Breeding MD   Signed by:   Minus Breeding MD on 03/25/2010   Method used:   Print then Give to Patient   RxID:   [id number] PRODIGY TWIST TOP LANCETS 28G  MISC (LANCETS) test three times a day  #3 month x 3   Entered and Authorized by:   Minus Breeding MD   Signed by:   Minus Breeding MD on 03/25/2010   Method used:   Faxed to ...       [address] [facility] (mail-order)       [address]       [place], [place]  [address]       Ph: [phone]       Fax: [phone]   RxID:   [id number] PRODIGY BLOOD GLUCOSE TEST  STRP (GLUCOSE BLOOD) two times a day, and lancets 250.00  #180 x 3   Entered and Authorized by:   Minus Breeding MD   Signed by:   Minus Breeding MD on 03/25/2010   Method used:   Faxed to ...       [facility]       [address]       [place], [place]  [address]       Ph: [phone]       Fax: [phone]   RxID:   [id number]    Orders Added: 1)  TLB-A1C / Hgb A1C (Glycohemoglobin) [83036-A1C] 2)  Est. Patient Level III [[address]]

## 2010-06-11 NOTE — Consult Note (Signed)
Summary: Duard Larsen MD Dermatology--skin tags, no treatment  Duard Larsen MD Dermatology   Imported By: Lanelle Bal 09/25/2009 12:35:36  _____________________________________________________________________  External Attachment:    Type:   Image     Comment:   External Document

## 2010-06-11 NOTE — Assessment & Plan Note (Signed)
Summary: rov for severe osa   Copy to:  [name] Primary Provider/Referring Provider:  Nolon Rod. Paz MD  CC:  Pt is here for a 6 month f/u appt.  Pt states she is not wearing her oxygen at night every night.  States she wears approx 2 to 3 x per week.  Pt states oxygen causes "nose to bleed."  Pt also c/o non productive at night.  .  History of Present Illness: the pt comes in today for f/u of her severe osa.  She is intolerant to positive pressure devices, and has been treated with nocturnal oxygen to try and prevent pulmonary htn from significant nocturnal desaturation.  She is unfortunately only wearing oxygen 2-3 nights a week due to nasal dryness.  She has a humidity bottle on her concentrator, and it has helped but not solved the issue.  Medications Prior to Update: 1)  Bayer Aspirin 325 Mg  Tabs (Aspirin) .... Qd 2)  Potassium Chloride Cr 10 Meq  Cpcr (Potassium Chloride) .Marland Kitchen.. 1 By Mouth Qd 3)  Carvedilol 25 Mg Tabs (Carvedilol) .Marland Kitchen.. 1 By Mouth Two Times A Day 4)  Taztia Xt 360 Mg Cp24 (Diltiazem Hcl Er Beads) .Marland Kitchen.. 1 By Mouth Once Daily 5)  Benazepril-Hydrochlorothiazide 20-12.5 Mg Tabs (Benazepril-Hydrochlorothiazide) .... 3 By Mouth Once Daily (In Am) 6)  Micardis 40 Mg  Tabs (Telmisartan) .... Qd 7)  Crestor 20 Mg Tabs (Rosuvastatin Calcium) .... Take 1 1/2 Tab Daily 8)  Glucophage 1000 Mg Tabs (Metformin Hcl) .Marland Kitchen.. 1 By Mouth Two Times A Day 9)  Bd Insulin Syringe Ultrafine 31g X 5/16" 0.3 Ml Misc (Insulin Syringe-Needle U-100) 10)  Insulin Pen .... Per Dr [name] 11)  Bd U/f Original Pen Needle 29g X 12.52mm  Misc (Insulin Pen Needle) .... Use As Directed 12)  Prodigy Blood Glucose Monitor  Devi (Blood Glucose Monitoring Suppl) .... As Dir 13)  Prodigy Blood Glucose Test  Strp (Glucose Blood) .... Two Times A Day, and Lancets 250.00 14)  Prodigy Twist Top Lancets 28g  Misc (Lancets) .... Test Three Times A Day 15)  Humalog Mix 50/50 Kwikpen 50-50 % Susp (Insulin Lispro Prot & Lispro)  .... 38 Units Two Times A Day 16)  Clonidine Hcl 0.2 Mg Tabs (Clonidine Hcl) .... Take 1 1/2 Tabs By Mouth Daily 17)  Protonix 40 Mg Tbec (Pantoprazole Sodium) .... Take 2 By Mouth Q Am As Needed  Allergies (verified): No Known Drug Allergies  Review of Systems       The patient complains of non-productive cough.  The patient denies shortness of breath with activity, shortness of breath at rest, productive cough, coughing up blood, chest pain, irregular heartbeats, acid heartburn, indigestion, loss of appetite, weight change, abdominal pain, difficulty swallowing, sore throat, tooth/dental problems, headaches, nasal congestion/difficulty breathing through nose, sneezing, itching, ear ache, anxiety, depression, hand/feet swelling, joint stiffness or pain, rash, change in color of mucus, and fever.    Vital Signs:  Patient profile:   60 year old female Height:      69 inches Weight:      304 pounds O2 Sat:      96 % on Room air Temp:     97.7 degrees F oral Pulse rate:   76 / minute BP sitting:   120 / 78  (left arm) Cuff size:   large  Vitals Entered By: Arman Filter LPN (Oct 04, 2009 1:56 PM)  O2 Flow:  Room air CC: Pt is here for a 6 month  f/u appt.  Pt states she is not wearing her oxygen at night every night.  States she wears approx 2 to 3 x per week.  Pt states oxygen causes "nose to bleed."  Pt also c/o non productive at night.   Comments Medications reviewed with patient Arman Filter LPN  Oct 04, 2009 1:59 PM    Physical Exam  General:  obese female in nad Nose:  no crusting or significant obstruction Neurologic:  alert, does not appear sleepy, moves [name] 4.   Impression & Recommendations:  Problem # 1:  OBSTRUCTIVE SLEEP APNEA (ICD-327.23) the pt is intolerant to any type of positive pressure device, and has been wearing oxygen in order to maintain sats.  She does feel that it helps her sleep, and feels more rested the next day.  She is having nasal dryness, and  needs to try ayr nasal saline gel or spray.  I have asked her to work aggressively on weight loss, and suspect she can discontinue oxygen if she is able to get her weight below 200 pounds.  She will contact me again when that occurs.  She is to let me know if something keeps her from wearing oxygen nightly.  Other Orders: Est. Patient Level II ([address])  Patient Instructions: 1)  continue with oxygen at night 2)  can use saline nasal spray at night before putting on oxygen 3)  work on weight loss 4)  followup with me once weight is less than 200 pounds.

## 2010-06-11 NOTE — Progress Notes (Signed)
Summary: refill  Phone Note Refill Request   Refills Requested: Medication #1:  CARVEDILOL 25 MG TABS 1 by mouth two times a day  Medication #2:  GLUCOPHAGE 1000 MG TABS 1 by mouth two times a day [facility] home delivery - [phone]  Initial call taken by: Okey Regal Spring,  March 04, 2010 10:55 AM    Prescriptions: GLUCOPHAGE 1000 MG TABS (METFORMIN HCL) 1 by mouth two times a day  #180 x 1   Entered by:   Army Fossa CMA   Authorized by:   Nolon Rod. Paz MD   Signed by:   Army Fossa CMA on 03/04/2010   Method used:   Faxed to ...       [facility] (mail[facility]order)       [address]       [place], [place]  [address]       Ph: [phone]       Fax: [phone]   RxID:   [id number] CARVEDILOL 25 MG TABS (CARVEDILOL) 1 by mouth two times a day  #180 x 1   Entered by:   Army Fossa CMA   Authorized by:   Nolon Rod. Paz MD   Signed by:   Army Fossa CMA on 03/04/2010   Method used:   Faxed to ...       [address] [facility] (mail-order)       [address]       [place], [place]  [address]       Ph: [phone]       Fax: [phone]   RxID:   [id number]

## 2010-06-11 NOTE — Letter (Signed)
Summary: CMN for [facility] / [facility]  CMN for [facility] / [facility]   Imported By: Lennie Odor 01/10/2010 10:51:22  _____________________________________________________________________  External Attachment:    Type:   Image     Comment:   External Document

## 2010-06-11 NOTE — Progress Notes (Signed)
Summary: Question about meds  Phone Note Call from Patient Call back at Home Phone [phone]   Caller: Patient Call For: [name] Summary of Call: Patient is having surgery on Sept. 8th and the Ortho Doc wants to know if she can stop taking her aspirin for 5 days.  Please call patient with response. Initial call taken by: Barnie Mort,  January 10, 2010 10:10 AM  Follow-up for Phone Call        there is a risk of not stopping aspirin . OK to stop aspirin for 5 days Follow-up by: Jose E. Paz MD,  January 10, 2010 12:03 PM  Additional Follow-up for Phone Call Additional follow up Details #1::        I spoke with pt she is aware. Army Fossa CMA  January 10, 2010 12:43 PM

## 2010-06-11 NOTE — Assessment & Plan Note (Signed)
Summary: 3 mos f/u #    Vital Signs:  Patient profile:   60 year old female Height:      67.5 inches (171.45 cm) Weight:      310.13 pounds (140.97 kg) O2 Sat:      95 % on Room air Temp:     97.5 degrees F (36.39 degrees C) oral Pulse rate:   78 / minute BP sitting:   122 / 70  (left arm) Cuff size:   large  Vitals Entered By: Josph Macho CMA (June 11, 2009 10:53 AM)  O2 Flow:  Room air CC: 3 month follow up/ CF Is Patient Diabetic? Yes   Referring Provider:  Everardo All Primary Provider:  Nolon Rod. Paz MD  CC:  3 month follow up/ CF.  History of Present Illness: she is still on humalog 50/50, 45 units two times a day.  she is having frequent mild hypoglycemia at any time of day.  she is depressed at the recent death of her brother.    Current Medications (verified): 1)  Bayer Aspirin 325 Mg  Tabs (Aspirin) .... Qd 2)  Potassium Chloride Cr 10 Meq  Cpcr (Potassium Chloride) .Marland Kitchen.. 1 By Mouth Qd 3)  Carvedilol 25 Mg Tabs (Carvedilol) .Marland Kitchen.. 1 By Mouth Two Times A Day 4)  Taztia Xt 360 Mg Cp24 (Diltiazem Hcl Er Beads) .Marland Kitchen.. 1 By Mouth Once Daily 5)  Benazepril-Hydrochlorothiazide 20-12.5 Mg Tabs (Benazepril-Hydrochlorothiazide) .... 3 By Mouth Once Daily (In Am) 6)  Micardis 40 Mg  Tabs (Telmisartan) .... Qd 7)  Crestor 20 Mg Tabs (Rosuvastatin Calcium) .... Take 1 1/2 Tab Daily 8)  Glucophage 1000 Mg Tabs (Metformin Hcl) .Marland Kitchen.. 1 By Mouth Two Times A Day 9)  [place] Lancet Super Thin 30g  Misc (Lancets) 10)  Bd Insulin Syringe Ultrafine 31g X 5/16" 0.3 Ml Misc (Insulin Syringe-Needle U-100) 11)  Insulin Pen .... Per Dr Everardo All 12)  Bd U/f Original Pen Needle 29g X 12.30mm  Misc (Insulin Pen Needle) .... Use As Directed 13)  Prodigy Blood Glucose Monitor  Devi (Blood Glucose Monitoring Suppl) .... As Dir 14)  Prodigy Blood Glucose Test  Strp (Glucose Blood) .... Two Times A Day, and Lancets 250.00 15)  Prodigy Twist Top Lancets 28g  Misc (Lancets) .... Test Three Times A Day 16)   Humalog Mix 50/50 Kwikpen 50-50 % Susp (Insulin Lispro Prot & Lispro) .... 45 Units Two Times A Day 17)  Clonidine Hcl 0.2 Mg Tabs (Clonidine Hcl) .... Take 1 1/2 Tabs By Mouth Daily  Allergies (verified): No Known Drug Allergies  Past History:  Past Medical History: Last updated: 04/10/2009 Severe OSA per sleep study 2010, Rx a CPAP PAIN IN JOINT, MULTIPLE SITES  DIABETES MELLITUS  DEGENERATIVE JOINT DISEASE  HYPERTENSION  2004--Cath neg, arterial LE dopplers neg 06-2003-- abd. u/s fatty liver 01-2005-- CP...neg u/s GB, neg HIDA 10-06--saw cards--cardiolite neg CP--abnormal stress test, Cath 9-09: normal 2006 Cscope --TICs Depression (Dx 09-2008)  Social History: Reviewed history from 03/15/2007 and no changes required. widow 4 kids  Review of Systems  The patient denies syncope.    Physical Exam  General:  morbidly obese.  no distress  Pulses:  dorsalis pedis intact bilat. Extremities:  no deformity.  no ulcer on the feet.  feet are of normal color and temp.   trace right pedal edema (none on left) Neurologic:  sensation is intact to touch on the feet.  Additional Exam:   Hemoglobin A1C       [  H]  7.1 %   Impression & Recommendations:  Problem # 1:  DIABETES MELLITUS (ICD-250.00) overcontrolled  Medications Added to Medication List This Visit: 1)  Humalog Mix 50/50 Kwikpen 50-50 % Susp (Insulin lispro prot & lispro) .... 40 units two times a day  Other Orders: TLB-A1C / Hgb A1C (Glycohemoglobin) (83036-A1C) Est. Patient Level III ([address])  Patient Instructions: 1)  check your blood glucose 2 times a day.  vary the time of day between before the 3 meals and at bedtime.  also check if you feel as though your glucose might be very high or too low.  bring a record of this to your doctor appointments 2)  Please schedule a follow-up appointment in 3 months. 3)  tests are being ordered for you today.  a few days after the test(s), please call [phone] to hear your  test results. 4)  reduce humalog 50/50 insulin to 40 units two times a day.

## 2010-06-11 NOTE — Letter (Signed)
Summary: [facility]  CMN/[facility]   Imported By: Lester Edmondson 01/22/2010 11:48:53  _____________________________________________________________________  External Attachment:    Type:   Image     Comment:   External Document

## 2010-06-11 NOTE — Progress Notes (Signed)
Summary: REFILL  Phone Note Refill Request Call back at [phone] Message from:  Pharmacy on March 04, 2010 8:18 AM  Refills Requested: Medication #1:  TAZTIA XT 360 MG CP24 1 by mouth once daily   Dosage confirmed as above?Dosage Confirmed   Supply Requested: 3 months [facility]  Initial call taken by: Lavell Islam,  March 04, 2010 8:21 AM    Prescriptions: TAZTIA XT 360 MG CP24 (DILTIAZEM HCL ER BEADS) 1 by mouth once daily  #90 x 1   Entered by:   Army Fossa CMA   Authorized by:   Nolon Rod. Paz MD   Signed by:   Army Fossa CMA on 03/04/2010   Method used:   Faxed to ...       [address] [facility] (mail-order)       [address]       [place], [place]  [address]       Ph: [phone]       Fax: [phone]   RxID:   [id number]

## 2010-06-11 NOTE — Progress Notes (Signed)
Summary: rx refill req  Phone Note Refill Request Message from:  Fax from Pharmacy on March 01, 2010 4:44 PM  Refills Requested: Medication #1:  HUMALOG MIX 50/50 KWIKPEN 50-50 % SUSP 38 units two times a day   Dosage confirmed as above?Dosage Confirmed   Last Refilled: 12/14/2009  Medication #2:  BD U/F ORIGINAL PEN NEEDLE 29G X 12.7MM  MISC use as directed   Dosage confirmed as above?Dosage Confirmed [facility] 90 day supply   Method Requested: Fax to Mail Away Pharmacy Initial call taken by: Brenton Grills MA,  March 01, 2010 4:45 PM    Prescriptions: HUMALOG MIX 50/50 KWIKPEN 50-50 % SUSP (INSULIN LISPRO PROT & LISPRO) 38 units two times a day  #3 month x 1   Entered by:   Brenton Grills MA   Authorized by:   Minus Breeding MD   Signed by:   Brenton Grills MA on 03/01/2010   Method used:   Faxed to ...       [facility] ([facility])       [address]       [place], [place]  [address]       Ph: [phone]       Fax: [phone]   RxID:   [id number] BD U/F ORIGINAL PEN NEEDLE 29G X 12.7MM  MISC (INSULIN PEN NEEDLE) use as directed  #3 month x 1   Entered by:   Brenton Grills MA   Authorized by:   Minus Breeding MD   Signed by:   Brenton Grills MA on 03/01/2010   Method used:   Faxed to ...       [address] [facility] (mail-order)       [address]       [place], [place]  [address]       Ph: [phone]       Fax: [phone]   RxID:   [id number]

## 2010-06-11 NOTE — Assessment & Plan Note (Signed)
Summary: 6 MONTH RETURN/[facility]   Allergies: No Known Drug Allergies   Other Orders: No Charge Patient Arrived (NCPA0) (NCPA0)   Dr. Shelle Iron patient; moved to his schedule; pt not seen by CDY today; no charge for this visit number.Reynaldo Minium CMA  Oct 04, 2009 1:43 PM

## 2010-06-11 NOTE — Progress Notes (Signed)
Summary: Rx Refill req  Phone Note Call from Patient Call back at Home Phone [phone]   Caller: Patient Call For: Dr Everardo All Summary of Call: Pt states pt has left message and [facility] has contacted [place] for test strips, lancets and diabetes med and is almost out.  Initial call taken by: Verdell Face,  March 20, 2010 1:51 PM  Follow-up for Phone Call        rx was faxed to [facility] last month on 03/01/10 will refax rx Follow-up by: Brenton Grills CMA Duncan Dull),  March 20, 2010 2:09 PM    Prescriptions: HUMALOG MIX 50/50 KWIKPEN 50-50 % SUSP (INSULIN LISPRO PROT & LISPRO) 38 units two times a day  #3 month x 1   Entered by:   Brenton Grills CMA (AAMA)   Authorized by:   Minus Breeding MD   Signed by:   Brenton Grills CMA (AAMA) on 03/20/2010   Method used:   Re-Faxed to ...       [facility] ([facility]       [address]       [place], [place]  [address]       Ph: [phone]       Fax: [phone]   RxID:   [id number] PRODIGY BLOOD GLUCOSE TEST  STRP (GLUCOSE BLOOD) two times a day, and lancets 250.00  #3 month x 1   Entered by:   Brenton Grills CMA (AAMA)   Authorized by:   Minus Breeding MD   Signed by:   Brenton Grills CMA (AAMA) on 03/20/2010   Method used:   Re-Faxed to ...       [facility] (mail[facility]       [address]       [place], [place]  [address]       Ph: [phone]       Fax: [phone]   RxID:   [id number] PRODIGY TWIST TOP LANCETS 28G  MISC (LANCETS) test three times a day  #3 month x 1   Entered by:   Brenton Grills CMA (AAMA)   Authorized by:   Minus Breeding MD   Signed by:   Brenton Grills CMA (AAMA) on 03/20/2010   Method used:   Re-Faxed to ...       [address] [facility] (mail-order)       [address]       [place], [place]  [address]       Ph: [phone]       Fax: [phone]   RxID:   [id number]   Appended Document: Rx Refill req according to Pt she hasn't received her lancets and test strips.[facility]ann Auer[facility] has called her stating they  haven't received fax. Explained to pt that the Rx has been re-faxed on 03/20/10. Stated she only has 2 days left of test strips. Asked if she could hv rx sent to local pharmacy w/o messing up her medicare. Explained that it could be called into local pharmacy, however, she could have to contact medicare to make sure it wouldn't mess up her benefits. Pt changed her mind stating she didn't have time to sit on hold all day. Advised her to contact [facility] again to see if have received fax and t call office back on Monday.

## 2010-06-12 ENCOUNTER — Other Ambulatory Visit: Payer: Self-pay

## 2010-06-12 ENCOUNTER — Encounter: Payer: Self-pay | Admitting: Internal Medicine

## 2010-06-13 NOTE — Progress Notes (Signed)
Summary: DIAG MMG REQUEST--sched cpx and pap  Phone Note Call from Patient Call back at [facility] Phone [phone]   Caller: Patient Summary of Call: PT IS CALLING FOR AND ODERS TO BE SENT TO THE [facility]  ON [address]-- THIS IS FOR A MAMMO Initial call taken by: Freddy Jaksch,  June 04, 2010 12:37 PM  Follow-up for Phone Call        I just spoke with patient who states she is requesting a Diagnostic MMG at the [facility] due to recent right breast pain/inner quadrant sometimes stabbing pain like needle.  Patient states she wants referral asap/today, and does not want to have to come in to see a doctor prior.  Will you refer? Magdalen Spatz [facility]  June 05, 2010 9:56 AM   Additional Follow-up for Phone Call Additional follow up Details #1::        okay to arrange the referral for a mammogram She had her female exam with Dr. Laury Axon 01-2009, she is due for a recheck. please arrange that as well Additional Follow-up by: [facility] E. Paz MD,  June 05, 2010 10:56 AM    Additional Follow-up for Phone Call Additional follow up Details #2::    Referral entered and will fwd to Sarah for patient appt to be scheduled. Lucious Groves CMA  June 05, 2010 11:43 AM   scheduled pap, cpx and fasting labs for Fri 06/21/2010 at 10:30 .Marland KitchenJerolyn Shin  June 06, 2010 11:44 AM

## 2010-06-13 NOTE — Assessment & Plan Note (Signed)
Summary: rto 5 months/cbs   Vital Signs:  Patient profile:   60 year old female Weight:      302.38 pounds Pulse rate:   87 / minute Pulse rhythm:   regular BP sitting:   132 / 88  (left arm) Cuff size:   large  Vitals Entered By: Army Fossa CMA (April 24, 2010 11:03 AM) CC: Follow up visit- not fasting Comments c/o pains in arms and shoulders onging  [facility] home delivery   History of Present Illness:  Follow up visit- not fasting  c/o pain  shoulders and also at the base of the left thumb. Some paresthesias in the second and third finger on the left.   Current Medications (verified): 1)  Bayer Aspirin 325 Mg  Tabs (Aspirin) .... Qd 2)  Potassium Chloride Cr 10 Meq  Cpcr (Potassium Chloride) .Marland Kitchen.. 1 By Mouth Qd 3)  Carvedilol 25 Mg Tabs (Carvedilol) .Marland Kitchen.. 1 By Mouth Two Times A Day 4)  Taztia Xt 360 Mg Cp24 (Diltiazem Hcl Er Beads) .Marland Kitchen.. 1 By Mouth Once Daily 5)  Benazepril-Hydrochlorothiazide 20-12.5 Mg Tabs (Benazepril-Hydrochlorothiazide) .... 3 By Mouth Once Daily (In Am) 6)  Micardis 40 Mg  Tabs (Telmisartan) .... Qd 7)  Crestor 20 Mg Tabs (Rosuvastatin Calcium) .... Take 1 1/2 Tab Daily 8)  Glucophage 1000 Mg Tabs (Metformin Hcl) .Marland Kitchen.. 1 By Mouth Two Times A Day 9)  Bd Insulin Syringe Ultrafine 31g X 5/16" 0.3 Ml Misc (Insulin Syringe-Needle U-100) 10)  Bd U/f Original Pen Needle 29g X 12.58mm  Misc (Insulin Pen Needle) .... Use As Directed 11)  Prodigy Blood Glucose Monitor  Devi (Blood Glucose Monitoring Suppl) .... As Dir 12)  Prodigy Blood Glucose Test  Strp (Glucose Blood) .... Two Times A Day, and Lancets 250.00 13)  Prodigy Twist Top Lancets 28g  Misc (Lancets) .... Test Three Times A Day 14)  Humalog Mix 50/50 Kwikpen 50-50 % Susp (Insulin Lispro Prot & Lispro) .... 38 Units Two Times A Day 15)  Clonidine Hcl 0.2 Mg Tabs (Clonidine Hcl) .... Take 1 1/2 Tabs By Mouth Daily  Allergies (verified): No Known Drug Allergies  Past History:  Past Medical  History: Reviewed history from 12/10/2009 and no changes required. Severe OSA per sleep study 2010, Rx a CPAP DJD PAIN IN JOINT, MULTIPLE SITES  DIABETES MELLITUS   HYPERTENSION  2004--Cath neg, arterial LE dopplers neg 06-2003-- abd. u/s fatty liver 01-2005-- CP...neg u/s GB, neg HIDA 10-06--saw cards--cardiolite neg CP--abnormal stress test, Cath 9-09: normal 2006 Cscope --TICs Depression (Dx 09-2008)  Past Surgical History: Reviewed history from 12/15/2007 and no changes required. 2005  -R- Knne replacecement 3-09 TKR left  Social History: Reviewed history from 12/10/2009 and no changes required. widow 4 kids tobacco-- no ETOH-- rarely not working, on disability exercising a little more   Review of Systems       good medication compliance with her BP meds. Patient is concerned because the medicines they send to her are "different" I explained to patient that these are generic and the size -color of the pills may be different. I review every bottle and she is taking her medications according to what we prescribed Derm:  she was referred to dermatology 3-11, the patient reports she did go and  she had a biopsy on a skin lesion on the face--- reportedly benign.  Physical Exam  General:  alert and well-developed.   Lungs:  normal respiratory effort, no intercostal retractions, no accessory muscle use, and normal breath  sounds.   Heart:  normal rate, regular rhythm, and no murmur.   Extremities:  range of motion of the shoulders limited particularly to elevation of the arms. passive range of motion) mild discomfort. Inspection on palpation of the wrists and hands shows  no joint puffiness   Impression & Recommendations:  Problem # 1:  HYPERTENSION (ICD-401.9) diastolic BP slightly high today. No change for now Her updated medication list for this problem includes:    Carvedilol 25 Mg Tabs (Carvedilol) .Marland Kitchen... 1 by mouth two times a day    Taztia Xt 360 Mg Cp24 (Diltiazem  hcl er beads) .Marland Kitchen... 1 by mouth once daily    Benazepril-hydrochlorothiazide 20-12.5 Mg Tabs (Benazepril-hydrochlorothiazide) .Marland KitchenMarland KitchenMarland KitchenMarland Kitchen 3 by mouth once daily (in am)    Micardis 40 Mg Tabs (Telmisartan) ..... Qd    Clonidine Hcl 0.2 Mg Tabs (Clonidine hcl) .Marland Kitchen... Take 1 1/2 tabs by mouth daily  BP today: 132/88 Prior BP: 124/76 (03/25/2010)  Labs Reviewed: K+: 4.1 (12/10/2009) Creat: : 0.9 (12/10/2009)   Chol: 157 (01/11/2009)   HDL: 57.10 (01/11/2009)   LDL: 82 (01/11/2009)   TG: 88.0 (01/11/2009)  Problem # 2:  DIABETES MELLITUS (ICD-250.00) per endocrinology Her updated medication list for this problem includes:    Bayer Aspirin 325 Mg Tabs (Aspirin) ..... Qd    Benazepril-hydrochlorothiazide 20-12.5 Mg Tabs (Benazepril-hydrochlorothiazide) .Marland KitchenMarland KitchenMarland KitchenMarland Kitchen 3 by mouth once daily (in am)    Micardis 40 Mg Tabs (Telmisartan) ..... Qd    Glucophage 1000 Mg Tabs (Metformin hcl) .Marland Kitchen... 1 by mouth two times a day    Humalog Mix 50/50 Kwikpen 50-50 % Susp (Insulin lispro prot & lispro) .Marland KitchenMarland KitchenMarland KitchenMarland Kitchen 38 units two times a day  Labs Reviewed: Creat: 0.9 (12/10/2009)     Last Eye Exam: normal (09/20/2009) Reviewed HgBA1c results: 7.9 (03/25/2010)  6.9 (12/17/2009)  Problem # 3:  PAIN IN JOINT, MULTIPLE SITES (ICD-719.49)  chart is reviewed In 2008 she had back pain, was seen byortho, no surgery was recommended, was Rx local injections. She also has on and off shoulder and hand pain. Even in 2008 we did a sedimentation rate and rheumatoid factor and they were negative. At this point, the range of motion of the shoulders is decreased------- refer her to physical therapy The thumb pain is likely DJD, she also has numbness in the fingers and this may be carpal syndrome. she does not like to go back to orthopedic surgery.------------------- will prescribe splinters and Tylenol, see instructions. also check a sedimentation rate    Orders: Physical Therapy Referral (PT)  Problem # 4:  HYPERLIPIDEMIA (ICD-272.4) due  for labs Her updated medication list for this problem includes:    Crestor 20 Mg Tabs (Rosuvastatin calcium) .Marland Kitchen... Take 1 1/2 tab daily  Labs Reviewed: SGOT: 20 (08/06/2009)   SGPT: 18 (08/06/2009)   HDL:57.10 (01/11/2009), 49.8 (08/11/2006)  LDL:82 (01/11/2009), 77 (08/11/2006)  Chol:157 (01/11/2009), 149 (08/11/2006)  Trig:88.0 (01/11/2009), 109 (08/11/2006)  Complete Medication List: 1)  Bayer Aspirin 325 Mg Tabs (Aspirin) .... Qd 2)  Potassium Chloride Cr 10 Meq Cpcr (Potassium chloride) .Marland Kitchen.. 1 by mouth qd 3)  Carvedilol 25 Mg Tabs (Carvedilol) .Marland Kitchen.. 1 by mouth two times a day 4)  Taztia Xt 360 Mg Cp24 (Diltiazem hcl er beads) .Marland Kitchen.. 1 by mouth once daily 5)  Benazepril-hydrochlorothiazide 20-12.5 Mg Tabs (Benazepril-hydrochlorothiazide) .... 3 by mouth once daily (in am) 6)  Micardis 40 Mg Tabs (Telmisartan) .... Qd 7)  Crestor 20 Mg Tabs (Rosuvastatin calcium) .... Take 1 1/2 tab daily 8)  Glucophage 1000 Mg Tabs (Metformin hcl) .Marland Kitchen.. 1 by mouth two times a day 9)  Bd Insulin Syringe Ultrafine 31g X 5/16" 0.3 Ml Misc (Insulin syringe-needle u-100) 10)  Bd U/f Original Pen Needle 29g X 12.60mm Misc (Insulin pen needle) .... Use as directed 11)  Prodigy Blood Glucose Monitor Devi (Blood glucose monitoring suppl) .... As dir 12)  Prodigy Blood Glucose Test Strp (Glucose blood) .... Two times a day, and lancets 250.00 13)  Prodigy Twist Top Lancets 28g Misc (Lancets) .... Test three times a day 14)  Humalog Mix 50/50 Kwikpen 50-50 % Susp (Insulin lispro prot & lispro) .... 38 units two times a day 15)  Clonidine Hcl 0.2 Mg Tabs (Clonidine hcl) .... Take 1 1/2 tabs by mouth daily  Patient Instructions: 1)  please come back fasting 2)  FLP, AST, ALT ----dx hyperlipidemia 3)  Sed rate -----dx 719.49 4)  Use your wrist splinters  every day 5)  Tylenol 500 mg 2 tablets every 6 hours as needed for pain 6)  Please schedule a follow-up appointment in 6 months .    Orders Added: 1)  Physical  Therapy Referral [PT] 2)  Est. Patient Level III [[address]]   Immunization History:  Influenza Immunization History:    Influenza:  declined, explained benefits (04/24/2010)   Immunization History:  Influenza Immunization History:    Influenza:  declined, explained benefits (04/24/2010)

## 2010-06-14 ENCOUNTER — Other Ambulatory Visit: Payer: Self-pay | Admitting: Internal Medicine

## 2010-06-14 ENCOUNTER — Ambulatory Visit
Admission: RE | Admit: 2010-06-14 | Discharge: 2010-06-14 | Disposition: A | Discharge status: Home / Self Care | Payer: Medicare Other | Source: Ambulatory Visit | Attending: Internal Medicine | Admitting: Internal Medicine

## 2010-06-14 ENCOUNTER — Encounter: Payer: Self-pay | Admitting: Internal Medicine

## 2010-06-14 DIAGNOSIS — N644 Mastodynia: Secondary | ICD-10-CM

## 2010-06-19 ENCOUNTER — Ambulatory Visit: Payer: Medicare Other | Attending: Internal Medicine | Admitting: Rehabilitative and Restorative Service Providers"

## 2010-06-19 DIAGNOSIS — M2569 Stiffness of other specified joint, not elsewhere classified: Secondary | ICD-10-CM | POA: Insufficient documentation

## 2010-06-19 DIAGNOSIS — R293 Abnormal posture: Secondary | ICD-10-CM | POA: Insufficient documentation

## 2010-06-19 DIAGNOSIS — IMO0001 Reserved for inherently not codable concepts without codable children: Secondary | ICD-10-CM | POA: Insufficient documentation

## 2010-06-19 DIAGNOSIS — M25519 Pain in unspecified shoulder: Secondary | ICD-10-CM | POA: Insufficient documentation

## 2010-06-21 ENCOUNTER — Other Ambulatory Visit: Payer: Self-pay | Admitting: Family Medicine

## 2010-06-21 ENCOUNTER — Other Ambulatory Visit (HOSPITAL_COMMUNITY)
Admission: RE | Admit: 2010-06-21 | Discharge: 2010-06-21 | Disposition: A | Discharge status: Home / Self Care | Payer: Medicare Other | Source: Ambulatory Visit | Attending: Family Medicine | Admitting: Family Medicine

## 2010-06-21 ENCOUNTER — Encounter: Payer: Self-pay | Admitting: Family Medicine

## 2010-06-21 ENCOUNTER — Encounter (INDEPENDENT_AMBULATORY_CARE_PROVIDER_SITE_OTHER): Payer: Medicare Other | Admitting: Family Medicine

## 2010-06-21 DIAGNOSIS — Z136 Encounter for screening for cardiovascular disorders: Secondary | ICD-10-CM

## 2010-06-21 DIAGNOSIS — R079 Chest pain, unspecified: Secondary | ICD-10-CM

## 2010-06-21 DIAGNOSIS — I10 Essential (primary) hypertension: Secondary | ICD-10-CM

## 2010-06-21 DIAGNOSIS — Z124 Encounter for screening for malignant neoplasm of cervix: Secondary | ICD-10-CM | POA: Insufficient documentation

## 2010-06-21 DIAGNOSIS — E785 Hyperlipidemia, unspecified: Secondary | ICD-10-CM

## 2010-06-21 DIAGNOSIS — D649 Anemia, unspecified: Secondary | ICD-10-CM

## 2010-06-21 DIAGNOSIS — E119 Type 2 diabetes mellitus without complications: Secondary | ICD-10-CM

## 2010-06-21 LAB — HEPATIC FUNCTION PANEL
AST: 23 U/L (ref 0–37)
Albumin: 4 g/dL (ref 3.5–5.2)
Alkaline Phosphatase: 68 U/L (ref 39–117)
Bilirubin, Direct: 0.1 mg/dL (ref 0.0–0.3)
Total Bilirubin: 0.8 mg/dL (ref 0.3–1.2)

## 2010-06-21 LAB — LIPID PANEL
HDL: 63.9 mg/dL (ref >39.00)
Triglycerides: 122 mg/dL (ref 0.0–149.0)
VLDL: 24.4 mg/dL (ref 0.0–40.0)

## 2010-06-21 LAB — BASIC METABOLIC PANEL
CO2: 30 mEq/L (ref 19–32)
Calcium: 9.3 mg/dL (ref 8.4–10.5)
Creatinine, Ser: 0.8 mg/dL (ref 0.4–1.2)
GFR: 89.82 mL/min (ref >60.00)
Glucose, Bld: 156 mg/dL — ABNORMAL HIGH (ref 70–99)
Sodium: 141 mEq/L (ref 135–145)

## 2010-06-21 LAB — MICROALBUMIN / CREATININE URINE RATIO
Creatinine,U: 102.1 mg/dL
Microalb Creat Ratio: 3.1 mg/g (ref 0.0–30.0)
Microalb, Ur: 3.2 mg/dL — ABNORMAL HIGH (ref 0.0–1.9)

## 2010-06-21 LAB — CBC WITH DIFFERENTIAL/PLATELET
Basophils Absolute: 0 10*3/uL (ref 0.0–0.1)
Eosinophils Absolute: 0.2 10*3/uL (ref 0.0–0.7)
Hemoglobin: 12.5 g/dL (ref 12.0–15.0)
Lymphocytes Relative: 34.3 % (ref 12.0–46.0)
MCHC: 34.3 g/dL (ref 30.0–36.0)
Monocytes Relative: 7.2 % (ref 3.0–12.0)
Neutrophils Relative %: 53.9 % (ref 43.0–77.0)
Platelets: 274 10*3/uL (ref 150.0–400.0)
RDW: 16.4 % — ABNORMAL HIGH (ref 11.5–14.6)

## 2010-06-21 LAB — HM DIABETES FOOT EXAM

## 2010-06-24 ENCOUNTER — Ambulatory Visit (INDEPENDENT_AMBULATORY_CARE_PROVIDER_SITE_OTHER): Payer: Medicare Other | Admitting: Endocrinology

## 2010-06-24 ENCOUNTER — Encounter: Payer: Self-pay | Admitting: Endocrinology

## 2010-06-24 DIAGNOSIS — E119 Type 2 diabetes mellitus without complications: Secondary | ICD-10-CM

## 2010-06-27 ENCOUNTER — Telehealth (INDEPENDENT_AMBULATORY_CARE_PROVIDER_SITE_OTHER): Payer: Self-pay | Admitting: *Deleted

## 2010-06-27 ENCOUNTER — Encounter: Payer: Self-pay | Admitting: Internal Medicine

## 2010-07-03 NOTE — Assessment & Plan Note (Signed)
Summary: cpx, pap and fasting labs///sph   Vital Signs:  Patient profile:   60 year old female Menstrual status:  postmenopausal Height:      68.5 inches Weight:      302.2 pounds BMI:     45.44 Pulse rate:   82 / minute Pulse rhythm:   regular BP sitting:   140 / 92  (left arm) Cuff size:   large  Vitals Entered By: Almeta Monas CMA Duncan Dull) (June 21, 2010 10:41 AM) CC: Cpx/Fasting with pap  Does patient need assistance? Functional Status Self care, Cook/clean, Shopping, Social activities Ambulation Normal Comments pt is able to do all adls and can read and write  Vision Screening:      Vision Comments: optho Dr Dione Booze q1y 40db HL: Left  Right  Audiometry Comment: grossly normal      Menstrual Status postmenopausal Last PAP Result NEGATIVE FOR INTRAEPITHELIAL LESIONS OR MALIGNANCY.   History of Present Illness: Pt here for cpe and pap and labs.   Pt still c/o chest pain in r breast..She states she saw Dr Drue Novel for this and told breast center the same thing.  She states it has been going on for 3 years.  Not affected by food.  Pt was referred to cardiology and GI then.   Pain is no different.    Type 1 diabetes mellitus follow-up      This is a 60 year old woman who presents with Type 2 diabetes mellitus follow-up.  The patient denies polyuria, polydipsia, blurred vision, self managed hypoglycemia, hypoglycemia requiring help, weight loss, weight gain, and numbness of extremities.  The patient denies the following symptoms: neuropathic pain, chest pain, vomiting, orthostatic symptoms, poor wound healing, intermittent claudication, vision loss, and foot ulcer.  Since the last visit the patient reports good dietary compliance, compliance with medications, exercising regularly, and monitoring blood glucose.  The patient has been measuring capillary blood glucose before breakfast and after lunch.  Since the last visit, the patient reports having had eye care by an  ophthalmologist and foot care by a podiatrist.    Hyperlipidemia follow-up      The patient also presents for Hyperlipidemia follow-up.  The patient denies muscle aches, GI upset, abdominal pain, flushing, itching, constipation, diarrhea, and fatigue.  The patient denies the following symptoms: chest pain/pressure, exercise intolerance, dypsnea, palpitations, syncope, and pedal edema.  Compliance with medications (by patient report) has been near 100%.  Dietary compliance has been good.  The patient reports exercising 3-4X per week.  Adjunctive measures currently used by the patient include ASA.    Hypertension follow-up      The patient also presents for Hypertension follow-up.  The patient denies lightheadedness, urinary frequency, headaches, edema, impotence, rash, and fatigue.  The patient denies the following associated symptoms: chest pressure, exercise intolerance, dyspnea, palpitations, syncope, leg edema, and pedal edema.  Compliance with medications (by patient report) has been near 100%.  The patient reports that dietary compliance has been good.  The patient reports exercising 3-4X per week.  Adjunctive measures currently used by the patient include salt   Dr Everardo All --DM Dr Dione Booze --ophto GI--  Dr Delilah Shan --- Dr Charlann Boxer  Preventive Screening-Counseling & Management  Alcohol-Tobacco     Alcohol drinks/day: <1     Smoking Status: never  Caffeine-Diet-Exercise     Caffeine use/day: 0     Does Patient Exercise: yes     Type of exercise: walk     Times/week: 2  Hep-HIV-STD-Contraception     Dental Visit-last 6 months no     Dental Care Counseling: to seek dental care; no dental care within six months     SBE monthly: yes      Sexual History:  widow.    Current Medications (verified): 1)  Bayer Aspirin 325 Mg  Tabs (Aspirin) .... Qd 2)  Potassium Chloride Cr 10 Meq  Cpcr (Potassium Chloride) .Marland Kitchen.. 1 By Mouth Qd 3)  Carvedilol 25 Mg Tabs (Carvedilol) .Marland Kitchen.. 1 By Mouth Two Times  A Day 4)  Taztia Xt 360 Mg Cp24 (Diltiazem Hcl Er Beads) .Marland Kitchen.. 1 By Mouth Once Daily 5)  Benazepril-Hydrochlorothiazide 20-12.5 Mg Tabs (Benazepril-Hydrochlorothiazide) .... 3 By Mouth Once Daily (In Am) 6)  Micardis 40 Mg  Tabs (Telmisartan) .... Qd 7)  Crestor 20 Mg Tabs (Rosuvastatin Calcium) .... Take 1 1/2 Tab Daily 8)  Glucophage 1000 Mg Tabs (Metformin Hcl) .Marland Kitchen.. 1 By Mouth Two Times A Day 9)  Bd Insulin Syringe Ultrafine 31g X 5/16" 0.3 Ml Misc (Insulin Syringe-Needle U-100) 10)  Bd U/f Original Pen Needle 29g X 12.86mm  Misc (Insulin Pen Needle) .... Use As Directed 11)  Prodigy Blood Glucose Monitor  Devi (Blood Glucose Monitoring Suppl) .... As Dir 12)  Prodigy Blood Glucose Test  Strp (Glucose Blood) .... Two Times A Day, and Lancets 250.00 13)  Prodigy Twist Top Lancets 28g  Misc (Lancets) .... Test Three Times A Day 14)  Humalog Mix 50/50 Kwikpen 50-50 % Susp (Insulin Lispro Prot & Lispro) .... 38 Units Two Times A Day 15)  Clonidine Hcl 0.2 Mg Tabs (Clonidine Hcl) .... Take 1 By Mouth Two Times A Day 16)  Protonix 40 Mg Tbec (Pantoprazole Sodium) .... 2 By Mouth Once Daily 17)  Zostavax 16109 Unt/0.63ml Solr (Zoster Vaccine Live) .Marland Kitchen.. 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 u/s fatty liver 01-2005-- CP...neg u/s GB, neg HIDA 10-06--saw cards--cardiolite neg CP--abnormal stress test, Cath 9-09: normal 2006 Cscope --TICs Depression (Dx 09-2008)  Past Surgical History: Last updated: 12/15/2007 2005  -R- Knne replacecement 3-09 TKR left  Family History: Last updated: 08/06/2009 Asthma-- M Diabetes 1st degree relative-- M, S, B Hypertension-- S , B  Stroke -- M MI--no breast ca-- no colon ca-- no pancreatic ca--  B  Social History: Last updated: 12/10/2009 widow 4 kids tobacco-- no ETOH-- rarely not working, on disability exercising a little more   Risk Factors: Alcohol Use: <1 (06/21/2010) Caffeine Use: 0 (06/21/2010) Exercise: yes (06/21/2010)  Risk Factors: Smoking Status: never (06/21/2010)  Family History: Reviewed history from 08/06/2009 and no changes required. Asthma-- M Diabetes 1st degree relative-- M, S, B Hypertension-- S , B  Stroke -- M MI--no breast ca-- no colon ca-- no pancreatic ca-- B  Social History: Reviewed history from 12/10/2009 and no changes required. widow 4 kids tobacco-- no ETOH-- rarely not working, on disability exercising a little more  Dental Care w/in 6 mos.:  no Sexual History:  widow  Review of Systems      See HPI General:  Denies chills, fatigue, fever, loss of appetite, malaise, sleep disorder, sweats, weakness, and weight loss. Eyes:  Denies blurring, discharge, double vision, eye irritation, eye pain, halos, itching, light sensitivity, red eye, vision loss-1  eye, and vision loss-both eyes. ENT:  Denies decreased hearing, difficulty swallowing, ear discharge, earache, hoarseness, nasal congestion, nosebleeds, postnasal drainage, ringing in ears, sinus pressure, and sore throat. CV:  Denies bluish discoloration of lips or nails, chest pain or discomfort, difficulty breathing at night, difficulty breathing while lying down, fainting, fatigue, leg cramps with exertion, lightheadness, near fainting, palpitations, shortness of breath with exertion, swelling of feet, swelling of hands, and weight gain. Resp:  Denies chest discomfort, chest pain with inspiration, cough, coughing up blood, excessive snoring, hypersomnolence, morning headaches, pleuritic, shortness of breath, sputum productive, and wheezing. GI:  Denies abdominal pain, bloody stools, change in bowel habits, constipation, dark tarry stools, diarrhea, excessive appetite, gas,  hemorrhoids, indigestion, loss of appetite, nausea, vomiting, vomiting blood, and yellowish skin color. GU:  Denies abnormal vaginal bleeding, decreased libido, discharge, dysuria, genital sores, hematuria, incontinence, nocturia, urinary frequency, and urinary hesitancy. MS:  Denies joint pain, joint redness, joint swelling, loss of strength, low back pain, mid back pain, muscle aches, muscle , cramps, muscle weakness, stiffness, and thoracic pain. Derm:  Denies changes in color of skin, changes in nail beds, dryness, excessive perspiration, flushing, hair loss, insect bite(s), itching, lesion(s), poor wound healing, and rash. Neuro:  Denies brief paralysis, difficulty with concentration, disturbances in coordination, falling down, headaches, inability to speak, memory loss, numbness, poor balance, seizures, sensation of room spinning, tingling, tremors, visual disturbances, and weakness. Psych:  Denies alternate hallucination ( auditory/visual), anxiety, depression, easily angered, easily tearful, irritability, mental problems, panic attacks, sense of great danger, suicidal thoughts/plans, thoughts of violence, unusual visions or sounds, and thoughts /plans of harming others. Endo:  Denies cold intolerance, excessive hunger, excessive thirst, excessive urination, heat intolerance, polyuria, and weight change. Heme:  Denies abnormal bruising, bleeding, enlarge lymph nodes, fevers, pallor, and skin discoloration. Allergy:  Denies hives or rash, itching eyes, persistent infections, seasonal allergies, and sneezing.  Physical Exam  General:  Well-developed,well-nourished,in no acute distress; alert,appropriate and cooperative throughout examination Head:  Normocephalic and atraumatic without obvious abnormalities. No apparent alopecia or balding. Eyes:  pupils equal, pupils round, pupils reactive to light, and no injection.   Ears:  External ear exam shows no significant lesions or deformities.   Otoscopic examination reveals clear canals, tympanic membranes are intact bilaterally without bulging, retraction, inflammation or discharge. Hearing is grossly normal bilaterally. Nose:  External nasal examination shows no deformity or inflammation. Nasal mucosa are pink and moist without lesions or exudates. Mouth:  Oral mucosa and oropharynx without lesions or exudates.  Teeth in good repair. Neck:  No deformities, masses, or tenderness noted. Chest Wall:  No deformities, masses, or tenderness noted. Breasts:  No mass, nodules, thickening, tenderness, bulging, retraction, inflamation, nipple discharge or skin changes noted.   Lungs:  Normal respiratory effort, chest expands symmetrically. Lungs are clear to auscultation, no crackles or wheezes. Heart:  normal rate and no murmur.   Abdomen:  Bowel sounds positive,abdomen soft and non-tender without masses, organomegaly or hernias noted. Rectal:  No external abnormalities noted. Normal sphincter tone. No rectal masses or tenderness. Genitalia:  Pelvic Exam:        External: normal female genitalia without lesions or masses        Vagina: normal without lesions or masses        Cervix: normal without lesions or masses        Adnexa: normal bimanual exam without masses or fullness        Uterus: normal by palpation  Pap smear: performed Msk:  normal ROM, no joint tenderness, no joint swelling, no redness over joints, and no joint deformities.   Pulses:  R and L carotid,radial,femoral,dorsalis pedis and posterior tibial pulses are full and equal bilaterally Extremities:  No clubbing, cyanosis, edema, or deformity noted with normal full range of motion of all joints.   Neurologic:  alert & oriented X3 and gait normal.   Skin:  Intact without suspicious lesions or rashes Cervical Nodes:  No lymphadenopathy noted Psych:  Cognition and judgment appear intact. Alert and cooperative with normal attention span and concentration. No apparent  delusions, illusions, hallucinations  Diabetes Management Exam:    Foot Exam (with socks and/or shoes not present):       Sensory-Pinprick/Light touch:          Left medial foot (L-4): normal          Left dorsal foot (L-5): normal          Left lateral foot (S-1): normal          Right medial foot (L-4): normal          Right dorsal foot (L-5): normal          Right lateral foot (S-1): normal       Sensory-Monofilament:          Left foot: normal          Right foot: normal       Inspection:          Left foot: normal          Right foot: normal       Nails:          Left foot: normal          Right foot: normal    Eye Exam:       Eye Exam done elsewhere          Date: 09/07/2009          Results: normal          Done by: Dr Dione Booze    Impression & Recommendations:  Problem # 1:  PREVENTIVE HEALTH CARE (ICD-V70.0)  check fasting labs ghm utd  Orders: Medicare -1st Annual Wellness Visit [phone]) EKG w/ Interpretation (93000)  Problem # 2:  DIABETES MELLITUS (ICD-250.00)  Her updated medication list for this problem includes:    Bayer Aspirin 325 Mg Tabs (Aspirin) ..... Qd    Benazepril-hydrochlorothiazide 20-12.5 Mg Tabs (Benazepril-hydrochlorothiazide) .Marland KitchenMarland KitchenMarland KitchenMarland Kitchen 3 by mouth once daily (in am)    Micardis 40 Mg Tabs (Telmisartan) ..... Qd    Glucophage 1000 Mg Tabs (Metformin hcl) .Marland Kitchen... 1 by mouth two times a day    Humalog Mix 50/50 Kwikpen 50-50 % Susp (Insulin lispro prot & lispro) .Marland KitchenMarland KitchenMarland KitchenMarland Kitchen 38 units two times a day  Orders: Venipuncture ([address]) TLB-Lipid Panel (80061-LIPID) TLB-BMP (Basic Metabolic Panel-BMET) (80048-METABOL) TLB-CBC Platelet - w/Differential (85025-CBCD) TLB-Hepatic/Liver Function Pnl (80076-HEPATIC) TLB-TSH (Thyroid Stimulating Hormone) (84443-TSH) TLB-A1C / Hgb A1C (Glycohemoglobin) (83036-A1C) TLB-Microalbumin/Creat Ratio, Urine (82043-MALB)  Labs Reviewed: Creat: 0.9 (12/10/2009)     Last Eye Exam: normal (09/20/2009) Reviewed HgBA1c results:  7.9 (03/25/2010)  6.9 (12/17/2009)  Problem # 3:  DEGENERATIVE JOINT DISEASE (ICD-715.90)  Her updated medication list for this problem includes:    Bayer Aspirin 325 Mg Tabs (Aspirin) ..... Qd  Discussed use of medications, application of heat or cold, and exercises.   Problem # 4:  HYPERTENSION (ICD-401.9)  Her updated medication list for this problem includes:  Carvedilol 25 Mg Tabs (Carvedilol) .Marland Kitchen... 1 by mouth two times a day    Taztia Xt 360 Mg Cp24 (Diltiazem hcl er beads) .Marland Kitchen... 1 by mouth once daily    Benazepril-hydrochlorothiazide 20-12.5 Mg Tabs (Benazepril-hydrochlorothiazide) .Marland KitchenMarland KitchenMarland KitchenMarland Kitchen 3 by mouth once daily (in am)    Micardis 40 Mg Tabs (Telmisartan) ..... Qd    Clonidine Hcl 0.2 Mg Tabs (Clonidine hcl) .Marland Kitchen... Take 1 by mouth two times a day  Orders: Venipuncture (19147) TLB-Lipid Panel (80061-LIPID) TLB-BMP (Basic Metabolic Panel-BMET) (80048-METABOL) TLB-CBC Platelet - w/Differential (85025-CBCD) TLB-Hepatic/Liver Function Pnl (80076-HEPATIC) TLB-TSH (Thyroid Stimulating Hormone) (84443-TSH) TLB-A1C / Hgb A1C (Glycohemoglobin) (83036-A1C) TLB-Microalbumin/Creat Ratio, Urine (82043-MALB)  BP today: 140/92 Prior BP: 132/88 (04/24/2010)  Labs Reviewed: K+: 4.1 (12/10/2009) Creat: : 0.9 (12/10/2009)   Chol: 161 (05/08/2010)   HDL: 58.00 (05/08/2010)   LDL: 86 (05/08/2010)   TG: 84.0 (05/08/2010)  Problem # 5:  UNSPECIFIED ANEMIA (ICD-285.9)  Orders: Venipuncture (82956) TLB-Lipid Panel (80061-LIPID) TLB-BMP (Basic Metabolic Panel-BMET) (80048-METABOL) TLB-CBC Platelet - w/Differential (85025-CBCD) TLB-Hepatic/Liver Function Pnl (80076-HEPATIC) TLB-TSH (Thyroid Stimulating Hormone) (84443-TSH) TLB-A1C / Hgb A1C (Glycohemoglobin) (83036-A1C) TLB-Microalbumin/Creat Ratio, Urine (82043-MALB)  Hgb: 12.3 (12/10/2009)   Hct: 36.0 (08/06/2009)   Platelets: 273.0 (08/06/2009) RBC: 3.88 (08/06/2009)   RDW: 14.2 (08/06/2009)   WBC: 5.3 (08/06/2009) MCV: 92.8  (08/06/2009)   MCHC: 31.8 (08/06/2009) Iron: 75 (12/10/2009)   % Sat: 22.9 (12/10/2009) B12: 351 (05/23/2008)   Folate: 13.8 (05/23/2008)   TSH: 2.21 (08/06/2009)  Problem # 6:  HYPERLIPIDEMIA (ICD-272.4)  Her updated medication list for this problem includes:    Crestor 20 Mg Tabs (Rosuvastatin calcium) .Marland Kitchen... Take 1 1/2 tab daily  Orders: Venipuncture (21308) TLB-Lipid Panel (80061-LIPID) TLB-BMP (Basic Metabolic Panel-BMET) (80048-METABOL) TLB-CBC Platelet - w/Differential (85025-CBCD) TLB-Hepatic/Liver Function Pnl (80076-HEPATIC) TLB-TSH (Thyroid Stimulating Hormone) (84443-TSH) TLB-A1C / Hgb A1C (Glycohemoglobin) (83036-A1C) TLB-Microalbumin/Creat Ratio, Urine (82043-MALB)  Labs Reviewed: SGOT: 24 (05/08/2010)   SGPT: 20 (05/08/2010)   HDL:58.00 (05/08/2010), 57.10 (01/11/2009)  LDL:86 (05/08/2010), 82 (01/11/2009)  Chol:161 (05/08/2010), 157 (01/11/2009)  Trig:84.0 (05/08/2010), 88.0 (01/11/2009)  Problem # 7:  DEPRESSION (ICD-311)  Problem # 8:  OBSTRUCTIVE SLEEP APNEA (ICD-327.23)  Complete Medication List: 1)  Bayer Aspirin 325 Mg Tabs (Aspirin) .... Qd 2)  Potassium Chloride Cr 10 Meq Cpcr (Potassium chloride) .Marland Kitchen.. 1 by mouth qd 3)  Carvedilol 25 Mg Tabs (Carvedilol) .Marland Kitchen.. 1 by mouth two times a day 4)  Taztia Xt 360 Mg Cp24 (Diltiazem hcl er beads) .Marland Kitchen.. 1 by mouth once daily 5)  Benazepril-hydrochlorothiazide 20-12.5 Mg Tabs (Benazepril-hydrochlorothiazide) .... 3 by mouth once daily (in am) 6)  Micardis 40 Mg Tabs (Telmisartan) .... Qd 7)  Crestor 20 Mg Tabs (Rosuvastatin calcium) .... Take 1 1/2 tab daily 8)  Glucophage 1000 Mg Tabs (Metformin hcl) .Marland Kitchen.. 1 by mouth two times a day 9)  Bd Insulin Syringe Ultrafine 31g X 5/16" 0.3 Ml Misc (Insulin syringe-needle u-100) 10)  Bd U/f Original Pen Needle 29g X 12.34mm Misc (Insulin pen needle) .... Use as directed 11)  Prodigy Blood Glucose Monitor Devi (Blood glucose monitoring suppl) .... As dir 12)  Prodigy Blood Glucose  Test Strp (Glucose blood) .... Two times a day, and lancets 250.00 13)  Prodigy Twist Top Lancets 28g Misc (Lancets) .... Test three times a day 14)  Humalog Mix 50/50 Kwikpen 50-50 % Susp (Insulin lispro prot & lispro) .... 38 units two times a day 15)  Clonidine Hcl 0.2 Mg Tabs (Clonidine hcl) .... Take 1 by mouth two times a  day 16)  Protonix 40 Mg Tbec (Pantoprazole sodium) .... 2 by mouth once daily 17)  Zostavax 16109 Unt/0.76ml Solr (Zoster vaccine live) .Marland Kitchen.. 1 ml im x1  Patient Instructions: 1)  Please schedule a follow-up appointment in 2 weeks.  Prescriptions: CLONIDINE HCL 0.2 MG TABS (CLONIDINE HCL) take 1 by mouth two times a day  #180 x 3   Entered and Authorized by:   Loreen Freud DO   Signed by:   Loreen Freud DO on 06/21/2010   Method used:   Faxed to ...       [facility] (mail-order)       [address]       [place], [place]  [address]       Ph: [phone]       Fax: [phone]   RxID:   [id number] ZOSTAVAX 19400 UNT/0.65ML SOLR (ZOSTER VACCINE LIVE) 1 ml IM x1  #1 x 0   Entered and Authorized by:   Loreen Freud DO   Signed by:   Loreen Freud DO on 06/21/2010   Method used:   Print then Give to Patient   RxID:   [id number] CLONIDINE HCL 0.2 MG TABS (CLONIDINE HCL) take 1 by mouth two times a day  #180 x 3   Entered and Authorized by:   Loreen Freud DO   Signed by:   Loreen Freud DO on 06/21/2010   Method used:   Print then Give to Patient   RxID:   [id number] CLONIDINE HCL 0.2 MG TABS (CLONIDINE HCL) take 1 by mouth two times a day  #60 x 2   Entered and Authorized by:   Loreen Freud DO   Signed by:   Loreen Freud DO on 06/21/2010   Method used:   Electronically to        [facility] [phone]* (retail)       [address]       [place], [place]  [address]       Ph: [phone] or [phone]       Fax: [phone]   RxID:   [id number]    Orders Added: 1)  Venipuncture [06269] 2)  TLB-Lipid Panel  [80061-LIPID] 3)  TLB-BMP (Basic Metabolic Panel-BMET) [80048-METABOL] 4)  TLB-CBC Platelet - w/Differential [85025-CBCD] 5)  TLB-Hepatic/Liver Function Pnl [80076-HEPATIC] 6)  TLB-TSH (Thyroid Stimulating Hormone) [84443-TSH] 7)  TLB-A1C / Hgb A1C (Glycohemoglobin) [83036-A1C] 8)  TLB-Microalbumin/Creat Ratio, Urine [82043-MALB] 9)  Medicare -1st Annual Wellness Visit [G0438] 10)  EKG w/ Interpretation [93000] 11)  Est. Patient Level III [48546]

## 2010-07-03 NOTE — Letter (Signed)
Summary: Verification of Receipt of Medicaid Covered Service  Verification of Receipt of Medicaid Covered Service   Imported By: Maryln Gottron 06/26/2010 14:06:10  _____________________________________________________________________  External Attachment:    Type:   Image     Comment:   External Document

## 2010-07-03 NOTE — Progress Notes (Signed)
Summary: Benazepril refill--mail order  Phone Note Refill Request Call back at Home Phone [phone] Message from:  Patient on June 27, 2010 11:40 AM  Refills Requested: Medication #1:  BENAZEPRIL-HYDROCHLOROTHIAZIDE 20-12.5 MG TABS 3 by mouth once daily (in AM) [facility] mail order---needs 90 days  Next Appointment Scheduled: Tues 6/12   Paz Initial call taken by: Jerolyn Shin,  June 27, 2010 11:42 AM    Prescriptions: BENAZEPRIL-HYDROCHLOROTHIAZIDE 20-12.5 MG TABS (BENAZEPRIL-HYDROCHLOROTHIAZIDE) 3 by mouth once daily (in AM)  #90 Tablet x 0   Entered by:   Army Fossa CMA   Authorized by:   Nolon Rod. Paz MD   Signed by:   Army Fossa CMA on 06/27/2010   Method used:   Faxed to ...       [address] [facility] (mail-order)       [address]       [place], [place]  [address]       Ph: [phone]       Fax: [phone]   RxID:   [id number]

## 2010-07-03 NOTE — Assessment & Plan Note (Signed)
Summary: 3 MTH FU STC   Vital Signs:  Patient profile:   60 year old female Menstrual status:  postmenopausal Height:      68.5 inches (173.99 cm) Weight:      307.50 pounds (139.77 kg) BMI:     46.24 O2 Sat:      96 % on Room air Temp:     98.3 degrees F (36.83 degrees C) oral Pulse rate:   91 / minute BP sitting:   132 / 90  (left arm) Cuff size:   large  Vitals Entered By: Brenton Grills CMA Duncan Dull) (June 24, 2010 1:35 PM)  O2 Flow:  Room air CC: 3 month F/U/aj Is Patient Diabetic? Yes   Referring Provider:  Everardo All Primary Provider:  Nolon Rod. Paz MD  CC:  3 month F/U/aj.  History of Present Illness: no change in sxs, for which she sees dr Laury Axon.  she brings a record of her cbg's which i have reviewed today.  all are in the low-100's, except once it was 49 at hs.  she does not recall the circumstances of this.    Current Medications (verified): 1)  Bayer Aspirin 325 Mg  Tabs (Aspirin) .... Qd 2)  Potassium Chloride Cr 10 Meq  Cpcr (Potassium Chloride) .Marland Kitchen.. 1 By Mouth Qd 3)  Carvedilol 25 Mg Tabs (Carvedilol) .Marland Kitchen.. 1 By Mouth Two Times A Day 4)  Taztia Xt 360 Mg Cp24 (Diltiazem Hcl Er Beads) .Marland Kitchen.. 1 By Mouth Once Daily 5)  Benazepril-Hydrochlorothiazide 20-12.5 Mg Tabs (Benazepril-Hydrochlorothiazide) .... 3 By Mouth Once Daily (In Am) 6)  Micardis 40 Mg  Tabs (Telmisartan) .... Qd 7)  Crestor 20 Mg Tabs (Rosuvastatin Calcium) .... Take 1 1/2 Tab Daily 8)  Glucophage 1000 Mg Tabs (Metformin Hcl) .Marland Kitchen.. 1 By Mouth Two Times A Day 9)  Bd Insulin Syringe Ultrafine 31g X 5/16" 0.3 Ml Misc (Insulin Syringe-Needle U-100) 10)  Bd U/f Original Pen Needle 29g X 12.38mm  Misc (Insulin Pen Needle) .... Use As Directed 11)  Prodigy Blood Glucose Monitor  Devi (Blood Glucose Monitoring Suppl) .... As Dir 12)  Prodigy Blood Glucose Test  Strp (Glucose Blood) .... Two Times A Day, and Lancets 250.00 13)  Prodigy Twist Top Lancets 28g  Misc (Lancets) .... Test Three Times A Day 14)   Humalog Mix 50/50 Kwikpen 50-50 % Susp (Insulin Lispro Prot & Lispro) .... 38 Units Two Times A Day 15)  Clonidine Hcl 0.2 Mg Tabs (Clonidine Hcl) .... Take 1 By Mouth Two Times A Day 16)  Protonix 40 Mg Tbec (Pantoprazole Sodium) .... 2 By Mouth Once Daily 17)  Zostavax 40102 Unt/0.13ml Solr (Zoster Vaccine Live) .Marland Kitchen.. 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 u/s fatty liver 01-2005-- CP...neg u/s GB, neg HIDA 10-06--saw cards--cardiolite neg CP--abnormal stress test, Cath 9-09: normal 2006 Cscope --TICs Depression (Dx 09-2008)  Physical Exam  General:  morbidly obese.  no distress. Pulses:  dorsalis pedis intact bilat. Extremities:  no deformity.  no ulcer on the feet.  feet are of normal color and temp.   trace right pedal edema (none on left) mycotic toenails.   bilat varicosities Neurologic:  sensation is intact to touch on the feet.  Additional Exam:  Hemoglobin A1C       [  H]  7.6 %    Impression & Recommendations:  Problem # 1:  DIABETES MELLITUS (ICD-250.00) this is the best control this pt should aim for, given this regimen, which does match insulin to her changing needs throughout the day  Other Orders: Est. Patient Level III (57846)  Patient Instructions: 1)  check your blood glucose 2 times a day.  vary the time of day between before the 3 meals and at bedtime.  also check if you feel as though your glucose might be very high or too low.  bring a record of this to your doctor appointments.  if it goes low, write down what you think might be the reason, so we can make adjustments.   2)  Please schedule a follow-up appointment in 3 months. 3)  please continue humalog 50/50 insulin 38 units two times a day (with the  first and last meals of the day).   Orders Added: 1)  Est. Patient Level III [96295]

## 2010-07-03 NOTE — Letter (Signed)
Summary: Verification of receipt/Medicaid Transportation  Verification of receipt/Medicaid Transportation   Imported By: Sherian Rein 06/27/2010 11:09:34  _____________________________________________________________________  External Attachment:    Type:   Image     Comment:   External Document

## 2010-07-04 ENCOUNTER — Ambulatory Visit: Payer: Medicare Other | Admitting: Rehabilitative and Restorative Service Providers"

## 2010-07-09 NOTE — Miscellaneous (Signed)
Summary: Renewal Summary for PT Services/[facility]  Renewal Summary for PT Services/ Rehab   Imported By: Maryln Gottron 07/02/2010 14:21:14  _____________________________________________________________________  External Attachment:    Type:   Image     Comment:   External Document

## 2010-08-26 ENCOUNTER — Telehealth: Payer: Self-pay | Admitting: Internal Medicine

## 2010-08-26 ENCOUNTER — Other Ambulatory Visit: Payer: Self-pay | Admitting: *Deleted

## 2010-08-26 MED ORDER — ROSUVASTATIN CALCIUM 20 MG PO TABS: ORAL_TABLET | ORAL | Status: DC

## 2010-08-26 MED ORDER — METFORMIN HCL 1000 MG PO TABS: 1000.0000 mg | ORAL_TABLET | Freq: Two times a day (BID) | ORAL | Status: DC

## 2010-08-26 MED ORDER — DILTIAZEM HCL ER BEADS 360 MG PO CP24: 360.0000 mg | ORAL_CAPSULE | Freq: Every day | ORAL | Status: DC

## 2010-08-26 MED ORDER — BENAZEPRIL-HYDROCHLOROTHIAZIDE 20-12.5 MG PO TABS: 3.0000 | ORAL_TABLET | ORAL | Status: DC

## 2010-08-26 MED ORDER — CARVEDILOL 25 MG PO TABS: 25.0000 mg | ORAL_TABLET | Freq: Two times a day (BID) | ORAL | Status: DC

## 2010-08-26 NOTE — Telephone Encounter (Signed)
Patient needs refill - benazepril - [facility] home delivery - 90 day supply

## 2010-08-27 ENCOUNTER — Other Ambulatory Visit: Payer: Self-pay | Admitting: Endocrinology

## 2010-08-27 ENCOUNTER — Other Ambulatory Visit: Payer: Self-pay | Admitting: *Deleted

## 2010-08-27 MED ORDER — TELMISARTAN 40 MG PO TABS: 40.0000 mg | ORAL_TABLET | Freq: Every day | ORAL | Status: DC

## 2010-08-27 MED ORDER — ROSUVASTATIN CALCIUM 20 MG PO TABS: ORAL_TABLET | ORAL | Status: DC

## 2010-08-27 MED ORDER — INSULIN PEN NEEDLE 29G X 12MM MISC: Status: DC

## 2010-08-27 MED ORDER — INSULIN LISPRO PROT & LISPRO (50-50 MIX) 100 UNIT/ML ~~LOC~~ SUSP: 38.0000 [IU] | Freq: Two times a day (BID) | SUBCUTANEOUS | Status: DC

## 2010-08-27 MED ORDER — POTASSIUM CHLORIDE 10 MEQ PO TBCR: 10.0000 meq | EXTENDED_RELEASE_TABLET | Freq: Every day | ORAL | Status: DC

## 2010-08-27 MED ORDER — METFORMIN HCL 1000 MG PO TABS: 1000.0000 mg | ORAL_TABLET | Freq: Two times a day (BID) | ORAL | Status: DC

## 2010-08-27 MED ORDER — CARVEDILOL 25 MG PO TABS: 25.0000 mg | ORAL_TABLET | Freq: Two times a day (BID) | ORAL | Status: DC

## 2010-08-27 MED ORDER — DILTIAZEM HCL ER BEADS 360 MG PO CP24: 360.0000 mg | ORAL_CAPSULE | Freq: Every day | ORAL | Status: DC

## 2010-08-27 NOTE — Telephone Encounter (Signed)
R'cd fax from [facility] for refills for pt's Micardis, Humalog, and pen needles  Last OV-06/24/2010  Last Filled-03/20/10-Humalog 03/04/2010-Micarid

## 2010-08-27 NOTE — Telephone Encounter (Signed)
Rx faxed to [facility]

## 2010-09-20 ENCOUNTER — Encounter: Payer: Self-pay | Admitting: Internal Medicine

## 2010-09-23 ENCOUNTER — Encounter: Payer: Self-pay | Admitting: Endocrinology

## 2010-09-23 ENCOUNTER — Other Ambulatory Visit (INDEPENDENT_AMBULATORY_CARE_PROVIDER_SITE_OTHER): Payer: Medicare Other

## 2010-09-23 ENCOUNTER — Ambulatory Visit (INDEPENDENT_AMBULATORY_CARE_PROVIDER_SITE_OTHER): Payer: Medicare Other | Admitting: Endocrinology

## 2010-09-23 VITALS — BP 94/62 | HR 87 | Temp 98.2°F | Ht 70.0 in | Wt 305.8 lb

## 2010-09-23 DIAGNOSIS — E119 Type 2 diabetes mellitus without complications: Secondary | ICD-10-CM

## 2010-09-23 NOTE — Progress Notes (Signed)
  Subjective:    Patient ID: [name], female    DOB: 26-Aug-1950, 60 y.o.   MRN: [id number]  HPI she brings a record of her cbg's which i have reviewed today.  Almost all are in the 100's, except very seldom over 200, and once it was 65 at 2pm( when lunch was delayed).    Review of Systems Denies loc    Objective:   Physical Exam GENERAL: no distress.  Obese SKIN: Insulin injection sites at the anterior abdomen are normal       Assessment & Plan:

## 2010-09-23 NOTE — Patient Instructions (Addendum)
good diet and exercise habits significanly improve the control of your diabetes.  please let me know if you wish to be referred to a dietician.  high blood sugar is very risky to your health.  you should see an eye doctor every year. controlling your blood pressure and cholesterol drastically reduces the damage diabetes does to your body.  this also applies to quitting smoking.  please discuss these with your doctor.  you should take an aspirin every day, unless you have been advised by a doctor not to. check your blood sugar 2 times a day.  vary the time of day when you check, between before the 3 meals, and at bedtime.  also check if you have symptoms of your blood sugar being too high or too low.  please keep a record of the readings and bring it to your next appointment here.  please call us sooner if you are having low blood sugar episodes. Please make a follow-up appointment in 3 months blood tests are being ordered for you today.  please call [phone] to hear your test results.  You will be prompted to enter the 9-digit "MRN" number that appears at the top left of this page, followed by #.  Then you will hear the message. pending the test results, please reduce the insulin to 36 units 2x a day (with the first and last meals of the day). On this type of insulin schedule, meals should be eaten on a regular schedule.

## 2010-09-24 NOTE — Assessment & Plan Note (Signed)
[facility]                            CARDIOLOGY OFFICE NOTE   [name], [name]                    MRN:          [id number]  DATE:12/17/2007                            DOB:          05/19/1950    The patient is a 60 year old female who I am asked to evaluate for chest  pain.  She apparently has had a long history of chest pain.  She did  undergo cardiac catheterization in July 2004.  At that time, she was  found to have no obstructive coronary artery disease.  Her ejection  fraction was 60%.  Her last Myoview was performed in October 2006.  At  that time, her ejection fraction was 53%.  There was no ischemia or  infarction.  Note, she was recently admitted to [facility] with  chest pain.  This was on December 08, 2007.  Note, her cardia markers were  negative.  A D-dimer was elevated and a CT of the chest was performed.  There is a question of small filling defect in the right lower lobe and  pulmonary arterial branch, but there was also respiratory motion in that  particular area.  It was felt that this defect was most likely  respiratory motion.  She ruled out for myocardial infarction with serial  enzymes, and liver functions were normal.  Her BMP was less than 30.  An  echocardiogram was also obtained on December 09, 2007.  Her LV function was  lower and normal with ejection fraction of 50-55%.  The left atrium is  mildly dilated.  She was discharged and she was asked to see [place] for  consultation concerning her chest pain.  Note, her chest pain is in the  substernal area and radiates under her ribs bilaterally.  She has had  this intermittently for years.  It can increase with lying flat as well  as deep inspiration.  It is not exertional.  It lasts for 5 minutes and  resolves spontaneously.  There is no associated nausea and vomiting, but  she does describe shortness of breath and diaphoresis.  It is described  as a pressure.  Note, she  also has some dyspnea on exertion as well as  orthopnea by her report.  She also states she can have pedal edema.  Because of the above, we were asked to further evaluate.   MEDICATIONS:  1. Metoprolol 50 mg tablets 2 p.o.  2. Taztia 360 mg p.o. daily.  3. Potassium 10 mEq p.o. daily.  4. Insulin.  5. Crestor 20 mg p.o. daily.  6. Benazepril HCT 20/12.5 mg tablets 2 p.o. daily.  7. Protonix 40 mg daily.  8. Metformin 1000 mg p.o. b.i.d.  9. Aspirin 325 mg p.o. daily.  10.Micardis 20 mg daily.   ALLERGIES:  She has no known drug allergies.   SOCIAL HISTORY:  She has a remote history of tobacco use, but does not  smoke at present.  She does occasionally consume alcohol.   FAMILY HISTORY:  Negative for coronary artery disease.   PAST MEDICAL HISTORY:  Significant for diabetes mellitus, hypertension,  and hyperlipidemia.  She has a history of hypothyroidism as well.  She  also has a history of osteoarthritis and back pain.  She has sleep  apnea.  She has had previous knee replacements.   REVIEW OF SYSTEMS:  She denies any headaches, fevers or chills.  There  is no productive cough or hemoptysis.  There is no dysphagia,  odynophagia, melena, or hematochezia.  There is no dysuria or hematuria.  There is no rash or seizure activity.  There is orthopnea, but there is  no PND.  She also has mild pedal edema by her report.  Remaining systems  are negative.   PHYSICAL EXAMINATION:  VITAL SIGNS:  Today shows a blood pressure of  126/79 and a pulse of 78.  She weighs 310 pounds.  GENERAL:  She is well developed and morbidly obese.  She is in no acute  distress at present.  She does not appear to be depressed.  SKIN:  Warm and dry. There is no peripheral clubbing.  BACK:  Normal.  HEENT:  Normal with normal eyelids.  NECK:  Supple with normal upstroke bilaterally.  No bruits noted.  There  is no jugular venous distention and I cannot appreciate thyromegaly.  CHEST:  Clear to  auscultation.  No expansion.  CARDIOVASCULAR:  Regular rate and rhythm.  Normal S1 and S2.  There are  no murmurs, rubs, or gallops noted.  ABDOMEN:  Nontender and nondistended.  Positive bowel sounds.  No  hepatosplenomegaly and no mass appreciated.  There is no abdominal  bruit.  Her femoral pulses are difficult to palpate due to her obesity.  EXTREMITIES:  No edema and I can palpate no cords.  She has 2+ dorsalis  pedis pulses bilaterally.  NEURO:  Grossly intact.   Her electrocardiogram shows a sinus rhythm at a rate of 80.  There is a  first-degree AV block.  There are minor nonspecific-ST changes.   DIAGNOSES:  1. Atypical chest pain - Melissa [name] presents for evaluation of chest      pain.  Her symptoms are extremely atypical and they are      longstanding.  She has had a previous Myoview that was normal and a      catheterization also that showed no obstructive coronary artery      disease.  It has been 3 years since her previous evaluation.  We      will plan to repeat her Myoview given her multiple risk factors,      but if this is normal, I will not pursue further cardiac      evaluation.  2. Hypertension - her blood pressure is adequately controlled on her      present medications.  3. Diabetes mellitus - management per Dr. Drue Novel.  4. Hyperlipidemia - management per Dr. Drue Novel.  5. Sleep apnea.  6. History of osteoarthritis.   We will see her back on an as-needed basis, pending results of her  Myoview.     Madolyn Frieze Jens Som, MD, [facility]  Electronically Signed    BSC/MedQ  DD: 12/17/2007  DT: 12/18/2007  Job #: [id number]   cc:   Willow Ora, MD

## 2010-09-24 NOTE — Procedures (Signed)
NAMEKATHLEENA, [name] NO.:  [id number]   MEDICAL RECORD NO.:  [id number]          PATIENT TYPE:  OUT   LOCATION:  SLEEP CENTER                 FACILITY:  [facility]   PHYSICIAN:  Barbaraann Share, MD,FCCPDATE OF BIRTH:  02-Nov-1948   DATE OF STUDY:  07/11/2008                            NOCTURNAL POLYSOMNOGRAM   REFERRING PHYSICIAN:  Barbaraann Share, MD,FCCP   LOCATION:  Sleep lab.   REFERRING PHYSICIAN:  Barbaraann Share, MD, FCCP.   INDICATION FOR THE STUDY:  Hypersomnia with sleep apnea.   EPWORTH SCORE:  8.   SLEEP ARCHITECTURE:  The patient had total sleep time of 312 minutes  with no slow wave sleep and only 57 minutes of REM.  Sleep onset latency  was normal at 14 minutes, and REM onset was normal at 98 minutes.  Sleep  efficiency was moderately reduced at 82%.   RESPIRATORY DATA:  The patient was found to have 113 apneas and 344  hypopneas, giving her an apnea-hypopnea index of 88 events per hour.  The events were not positional, but there was loud snoring noted  throughout.   OXYGEN DATA:  There was O2 desaturation as low as 62% with her  obstructive events.   CARDIAC DATA:  No clinically significant arrhythmias were seen.   MOVEMENT/PARASOMNIA:  No significant leg jerks or abnormal behaviors  were noted.   IMPRESSION/RECOMMENDATIONS:  Severe obstructive sleep apnea/hypopnea  syndrome with an apnea-hypopnea index of 88 events per hour and O2  desaturation as low as 62%.  Treatment for this degree of sleep apnea  should focus primarily on weight loss as well as CPAP.      Barbaraann Share, MD,FCCP  Diplomate, American Board of Sleep  Medicine  Electronically Signed    KMC/MEDQ  D:  07/22/2008 14:29:52  T:  07/23/2008 03:58:06  Job:  [id number]

## 2010-09-24 NOTE — Assessment & Plan Note (Signed)
[facility]                            CARDIOLOGY OFFICE NOTE   [name], [name]                    MRN:          [id number]  DATE:01/27/2008                            DOB:          1950/10/15    [name] is a 60 year old female that I recently saw on December 17, 2007, for atypical chest pain.  Please refer to my notes for details.  At that time, we did schedule her to have a Myoview for risk  stratification.  This was performed on January 06, 2008.  There is a  small area of ischemia in the base of the inferior wall, which is new  compared to previous.  Her ejection fraction was 56%.  Since then she  has not had any further chest pain, although she does have dyspnea on  exertion.  There is no orthopnea or PND and there is no pedal edema.  She has not had syncope.   MEDICATIONS:  1. Crestor 20 mg p.o. daily.  2. Lopressor 50 mg p.o. b.i.d.  3. Metformin 1000 mg p.o. b.i.d.  4. Benazepril HCT 20/12.5 mg p.o. b.i.d.  5. Taztia 360 mg p.o. daily.  6. Micardis 20 mg p.o. daily.  7. Potassium 10 mEq p.o. daily.  8. Insulin.  9. Protonix 40 mg p.o. b.i.d.  10.Aspirin 325 mg p.o. daily.   PHYSICAL EXAMINATION:  VITAL SIGNS:  Today shows a blood pressure  168/104, on recheck it was 160/90.  Her pulse is 82.  She weighs 316  pounds.  HEENT:  Normal.  NECK:  Supple.  CHEST:  Clear.  CARDIOVASCULAR:  Regular rate and rhythm.  ABDOMEN:  No tenderness.  EXTREMITIES:  No edema.   DIAGNOSES:  1. Abnormal nuclear study/atypical chest pain - I have discussed the      options with [name] today.  The options would include the      continued medical therapy/observation as I do not feel the study is      high risk.  However, there is a clear abnormality compared to      previous.  She is concerned about her stress test and would like      definitive evaluation.  We will, therefore, proceed with cardiac      catheterization.  The risk and benefits  have been discussed and she      agrees to proceed.  2. Hypertension - blood pressure is mildly elevated today.  We will      need to track this and adjust her regimen as indicated.  3. Diabetes mellitus - management per Dr. Drue Novel.  4. Hyperlipidemia - she continue on her statin.  This is being      followed by Dr. Drue Novel.  5. History of sleep apnea.  6. History of osteoarthritis.   We will see her back in 4 weeks in the meantime, we await her  catheterization result.     Madolyn Frieze Jens Som, MD, [facility]  Electronically Signed    BSC/MedQ  DD: 01/27/2008  DT: 01/28/2008  Job #: [id number]   cc:   Willow Ora, MD

## 2010-09-24 NOTE — Assessment & Plan Note (Signed)
[facility]                            CARDIOLOGY OFFICE NOTE   DIAMANTINA, EDINGER                    MRN:          [id number]  DATE:03/17/2008                            DOB:          28-Oct-1950    [name] is a pleasant 60 year old female who has a history of  atypical chest pain.  She recently had a Myoview that showed small area  of ischemia in the inferobasal wall.  Ejection fraction was 52%.  Because of that, she underwent cardiac catheterization on February 01, 2008, by Dr. Excell Seltzer.  Her coronaries were normal.  Since then, she  states her chest pain has improved, but she still occasionally has it.  She also has dyspnea on exertion.  There is no pedal edema.   MEDICATIONS:  1. Micardis 40 mg p.o. daily.  2. Metformin 1000 mg p.o. b.i.d.  3. Crestor 20 mg p.o. daily.  4. Aspirin 325 mg p.o. daily.  5. Cardizem 360 mg p.o. daily.  6. Benazepril HCT 20/12.5 mg p.o. b.i.d.  7. Potassium 10 mEq p.o. daily.  8. Insulin.  9. Carvedilol 25 mg p.o. b.i.d.   PHYSICAL EXAMINATION:  VITAL SIGNS:  Blood pressure of 100/63 and pulse  88.  She weighs 115 pounds.  HEENT:  Normal.  NECK:  Supple.  ABDOMEN:  Benign.  EXTREMITIES:  No edema.   Her cath site in the radial artery is without evidence of infection.   Electrocardiogram shows a sinus rhythm at a rate of 80.  There is first-  degree AV block.  There are no ST changes noted.   DIAGNOSES:  1. History of atypical chest pain - her cardiac catheterization again      demonstrated normal coronary arteries.  We will not pursue this      further.  2. Hypertension - her blood pressure is adequately controlled today.  3. Diabetes mellitus - managed per Dr. Drue Novel.  4. Hyperlipidemia - she will continue on her statin.  This is being      followed by Dr. Drue Novel.  5. Sleep apnea.  6. Arthritis.  7. Obesity.   I will see her back on as-needed basis.     Madolyn Frieze Jens Som, MD, [facility]  Electronically Signed    BSC/MedQ  DD: 03/17/2008  DT: 03/17/2008  Job #: [id number]   cc:   Willow Ora, MD

## 2010-09-24 NOTE — Discharge Summary (Signed)
[name], [name]             ACCOUNT NO.:  [id number]   MEDICAL RECORD NO.:  [id number]          PATIENT TYPE:  INP   LOCATION:  1615                         FACILITY:  [facility]   PHYSICIAN:  [name], M.D.  DATE OF BIRTH:  September 03, 1950   DATE OF ADMISSION:  07/20/2007  DATE OF DISCHARGE:  07/26/2007                               DISCHARGE SUMMARY   ADMITTING DIAGNOSES:  1. Osteoarthritis.  2. Hypertension.  3. Diabetes, insulin dependent.  4. Sleep apnea.   DISCHARGE DIAGNOSES:  1. Osteoarthritis.  2. Hypertension.  3. Diabetes, insulin dependent.  4. Sleep apnea.  5. Postoperative hypokalemia.   CONSULTATIONS:  None.   PROCEDURE:  Left total knee replacement.  Surgeon:  Dr. Durene Romans.  Assistant:  Lenise Herald, PA-C.   LABORATORIES:  Postoperative CBCs showed her hematocrit to be 32.9 on  postop day #1, tracked throughout, remained stable, and at discharge was  32.5.  CMET on postop day #1:  Sodium 139, potassium 3.4, creatinine  0.86, glucose 78.  Postop day #2:  Sodium 134, potassium 3.4, creatinine  0.69, glucose 120.  Replaced with some K-Dur.  Her final metabolic panel  showed her sodium 137, potassium 4.1, creatinine 0.82, glucose 111.  She  did have a couple of hypoglycemic events, with her CBG readings being 57  on July 23, 2007 and then 52 on July 24, 2007.  Coags showed her INR  to be 1.  A1c 6.8.  UA negative.   RADIOLOGY:  Chest, two view:  No acute disease.   CARDIOLOGY:  EKG:  Normal sinus rhythm.   HOSPITAL COURSE:  The patient underwent left total knee replacement on  July 20, 2007 and tolerated the procedure well and was admitted to the  orthopedic floor.  When seen on postoperative day #1, had some nausea  and vomiting the night before, but it had resolved by the time we saw  her in the morning.  Her potassium was a little bit low.  Her dressing  was checked.  Hemovac was discontinued.  She was weightbearing as  tolerated.  She had a  tremendous amount of pain, limiting her mobility,  with immediate assessment of home versus SNF determined.   On postop day #2, seen for initial OT evaluation, required a significant  amount of help.  CBG prior to lunch was 62.  Given some graham crackers  and peanut butter.  Rechecked in 30 minutes, and it was 219.  She had  very slow progress with PT, requiring maximum assistance with transfers.  Social work psychosocial assessment on July 23, 2007 had not made a  decision about her SNF placement.  A note was placed on chart that she  would be less than a 30-day stay in a nursing facility.  This was  signed.  Seen by orthopedics, doing well, afebrile.  She was stable.  Continued her activity. Replacement with K-Dur.   Seen on postop day #3, on July 23, 2007, was doing better, afebrile.  Wound was dry.  Still slow progress.  She had a hypoglycemic event of  57, which was treated.  Seen on the July 24, 2007.  Due to slow progress, continued to plan for  SNF placement.  A low CBG again to 52.  Orders given for some sugar  replacement.   Seen on July 25, 2007.  Wound was clean and dry.  Calf non-swollen.  She continued to have poor quad function.  Lovenox was continued.   Seen on July 26, 2007.  She had continue to make slow progress over the  weekend.  All her lab values were normal.  Dressing was changed.  The  wound with great.  She was neurovascularly intact to the left lower  extremity.  She could do a straight leg raise with great effort and  pain.  She continued be weightbearing as tolerated.  We discussed SNF  placement for a couple weeks until she was able to independently  transfer.  She was agreeable to this decision.  Appropriate discussions  were discussed above physical therapy and her ability to independently  ambulate.   DISCHARGE DISPOSITION:  Discharged in stable and improved condition to  skilled nursing facility rehab for improved progress training.    DISCHARGE PHYSICAL THERAPY:  She is weightbearing as tolerated with the  use of a rolling walker.  Goals of physical therapy will be to increase  independence in activities of daily living.  Range of motion goals 0-100  at 2 weeks, 0-120 at 6 weeks.  Want to work on quad functioning,  balance, proprioception.   DISCHARGE WOUND CARE:  Keep wound dry.  Change dressing on a daily  basis.  Do not get wet.  If the patient would like to shower, need to  cover with plastic, tape edges, redress.  Discharge wound care:  Keep  dry.   DISCHARGE FOLLOWUP:  Follow up with Dr. Charlann [name] at phone number [phone] in  10 days for a wound check.   HOME MEDICATIONS:  1. Diltiazem HCl 360 mg 1 p.o. daily.  2. Crestor 20 mg 1 p.o. daily.  3. Protonix 40 mg 1 p.o. q.a.m. and p.r.n.  4. Metoprolol 50 mg 1 p.o. b.i.d.  5. Benazepril/HCT 20/12.5, 1 p.o. b.i.d.  6. Metformin 1000 mg 1 p.o. b.i.d.  7. Potassium 10 mEq 1 p.o. daily.  8. Micardis 20 mg 1 p.o. daily.  9. Humalog 75/25 pen, 70 units subcutaneously q.a.m.  10.Humalog 75/25, 40 units subcutaneously q.p.m.Marland Kitchen   DISCHARGE MEDICATIONS:  1. Lovenox 40 mg subcutaneously q.24 h. x7 days.  2. Then, start aspirin 325 mg 1 p.o. daily x4 weeks after Lovenox is      completed.  3. Robaxin 500 mg p.o. q.6 h. p.r.n. muscle spasm or pain.  4. Iron 325 mg p.o. t.i.d. x1 week.  5. Colace 100 mg p.o. b.i.d. p.r.n. constipation.  6. MiraLax 17 g p.o. daily p.r.n. constipation.  7. Vicodin 5/325, 1-2 p.o. q.4-6 h. p.r.n. pain.  8. Enema of choice, laxative of choice.   DISCHARGE SPECIAL INSTRUCTIONS:  TED hose, on 12 hours, off 12 hours.   DISCHARGE DIET:  Heart healthy.     ______________________________  [name]. [name], [place]      [name], M.D.  Electronically Signed    BLM/MEDQ  D:  07/26/2007  T:  07/26/2007  Job:  [id number]

## 2010-09-24 NOTE — Cardiovascular Report (Signed)
NAMEHASET, OAXACA NO.:  [id number]   MEDICAL RECORD NO.:  [id number]          PATIENT TYPE:  OIB   LOCATION:  2899                         FACILITY:  [facility]   PHYSICIAN:  Veverly Fells. Excell Seltzer, MD  DATE OF BIRTH:  1950-12-10   DATE OF PROCEDURE:  DATE OF DISCHARGE:  02/01/2008                            CARDIAC CATHETERIZATION   PROCEDURE:  Selective coronary angiography.   INDICATIONS:  Ms. Lanuza is a 60 year old woman with diabetes,  hypertension, dyslipidemia, and morbid obesity.  She has had chest pain.  She underwent a nuclear study that showed inferior wall ischemia.  She  was referred for cardiac catheterization.   Risks and indications of procedure were reviewed with the patient.  Informed consent was obtained.  I elected to perform the case in the  right radial artery in the setting of the patient's body habitus.  The  right wrist was prepped and draped under normal sterile conditions.  Lidocaine was used for local anesthesia.  The patient was premedicated  with 1 mg of Versed and 25 mg of fentanyl.  Using micropuncture kit, the  right radial artery was accessed via a front-wall puncture.  A 5-French  radial sheath was placed.  A 5-French Judkins catheters were used for  coronary angiography.  For the left coronary artery, an AL-1 catheter  was used.  For the right coronary artery, a JR-4 catheter was used.  I  attempted to cross the aortic valve with a pigtail catheter, but was  unable to cross because of angulation of the innominate artery into the  ascending aorta.  The patient has had normal LV function and I did not  make a prolonged effort crossing the valve.   FINDINGS:  Aortic pressure 109/74 with a mean of 94.   Coronary angiography:  Left mainstem angiographically normal,  trifurcates into the LAD, intermediate branch, and left circumflex.   LAD:  A large-caliber vessel, courses down and wraps around the LV apex.  The LAD supplies 2  moderate-sized diagonal branches from the midportion  in the vicinity of the D2 and D3 distributions.  There is a small first  diagonal branch.   The intermediate branch:  The intermediate branch is tortuous.  It is a  large vessel and is widely patent.   Left circumflex:  The left circumflex is codominant with the right  coronary artery.  There are 2 marginal branches, a left posterolateral  branch, and a left PDA.  The left circumflex is widely patent throughout  its course.  The left PDA is small.  The marginal branches are moderate  size.   Right coronary artery.  The right coronary artery is codominant.  It  courses down and supplies a small PDA branch as well as an acute  marginal branch.  There is no significant stenosis throughout the right  coronary artery.   ASSESSMENT:  Normal coronary arteries.   PLAN:  Recommend primary risk reduction.  The patient's chest pain was  likely noncardiac and suspect a false-positive Myoview scan.      Veverly Fells. Excell Seltzer, MD  Electronically Signed  MDC/MEDQ  D:  02/01/2008  T:  02/02/2008  Job:  [id number]   cc:   Madolyn Frieze. Jens Som, MD, [facility]  Willow Ora, MD

## 2010-09-24 NOTE — Consult Note (Signed)
[facility]                          ENDOCRINOLOGY CONSULTATION   [name], [name]                    MRN:          [id number]  DATE:10/20/2006                            DOB:          1951-02-09    REASON FOR VISIT:  Followup diabetes.   HISTORY OF PRESENT ILLNESS:  A 60 year old woman who developed a GI  illness last week and was seen in the emergency room. She stopped her  insulin when she became ill. Her glucoses then went up to the 300.  Otherwise, her glucoses range from the high 100s to up to 200 with no  particular pattern.   PAST MEDICAL HISTORY:  Same as August 12, 2006.   REVIEW OF SYSTEMS:  Denies hypoglycemia.   PHYSICAL EXAMINATION:  VITAL SIGNS:  Blood pressure 183/104, heart rate  is 86, temperature 98.5, weight is 311.  GENERAL:  Obese, does not appear anxious nor depressed.   LABORATORY DATA:  I reviewed the patient's home glucose record. She has  a number of numbers written down but does not remember what time of day  she checked her glucose.   IMPRESSION:  This patient appears to need a simpler insulin schedule.   PLAN:  1. Change your current insulins to Humalog 75/25, 70 units every      morning and 40 units q.p.m.  2. Return in 30 days.     Sean A. Everardo All, MD  Electronically Signed    SAE/MedQ  DD: 10/20/2006  DT: 10/21/2006  Job #: [id number]   cc:   Willow Ora, MD

## 2010-09-24 NOTE — Assessment & Plan Note (Signed)
[facility]                         GASTROENTEROLOGY OFFICE NOTE   Melissa, [name]                    MRN:          [id number]  DATE:09/21/2006                            DOB:          07-17-1950    REASON FOR CONSULTATION:  Heme positive stool.   [name] is a pleasant 60 year old African American female, referred  through the courtesy of Dr. Laury Axon for evaluation.  On routine testing  during a Pap smear she tested Hemoccult positive.  [name] has no GI  complaints except for one episode of blood mixed with her stools.  She  has a history of colon polyps.  Last examination by colonoscopy in 2005  demonstrated scattered diverticula in the right colon.  She is on no  gastric irritants including nonsteroidals.  She does have longstanding  pyrosis which is well controlled with Protonix.  She denies dysphagia.   PAST MEDICAL HISTORY:  Pertinent for hypertension and angina.  She has  diabetes and arthritis and depression.  She is status post tubal  ligation.   FAMILY HISTORY:  Pertinent for a sister with diabetes.   MEDICATIONS:  Insulin, metoprolol, Crestor, benazepril/HCTZ,  hydrocodone, Diltiazem, Protonix, metformin, aspirin, Micardis, Lyrica.   SHE HAS NO ALLERGIES.   She neither smokes nor drinks, she is widowed.   REVIEW OF SYSTEMS:  Positive for feet swelling, joint pains, sleeping  problems, back pain, shortness of breath, and muscle cramps.   PHYSICAL EXAMINATION:  Pulse 88, weight 317.  HEENT: EOMI. PERRLA. Sclerae are anicteric.  Conjunctivae are pink.  NECK:  Supple without thyromegaly, adenopathy or carotid bruits.  CHEST:  Clear to auscultation and percussion without adventitious  sounds.  CARDIAC:  Regular rhythm; normal S1 S2.  There are no murmurs, gallops  or rubs.  ABDOMEN:  Bowel sounds are normoactive.  Abdomen is soft, non-tender and  non-distended.  There are no abdominal masses, tenderness, splenic  enlargement or hepatomegaly.  EXTREMITIES:  Full range of motion.  No cyanosis, clubbing.  She has  trace pedal edema bilaterally.  RECTAL:  Deferred.   IMPRESSION:  1. Heme positive stool.  Lower gastrointestinal sources including      polyps, arteriovenous malformations, neoplasm, and hemorrhoids are      possibilities.  An upper gastrointestinal bleeding source is also a      consideration.  2. Hematochezia.  This certainly suggests a distal colonic source for      bleeding.  3. Gastroesophageal reflux disease.  With longstanding      gastroesophageal reflux disease, Barrett's esophagus ought to be      ruled out.   RECOMMENDATION:  1. Colonoscopy.  2. Upper endoscopy.     Barbette Hair. Arlyce Dice, MD,FACG  Electronically Signed    RDK/MedQ  DD: 09/21/2006  DT: 09/21/2006  Job #: [id number]   cc:   Lelon Perla, DO

## 2010-09-24 NOTE — H&P (Signed)
NAMEDARLENE, BROZOWSKI             ACCOUNT NO.:  [id number]   MEDICAL RECORD NO.:  [id number]          PATIENT TYPE:  INP   LOCATION:  1825                         FACILITY:  [facility]   PHYSICIAN:  Kela Millin, M.D.DATE OF BIRTH:  1951/03/14   DATE OF ADMISSION:  12/08/2007  DATE OF DISCHARGE:                              HISTORY & PHYSICAL   PRIMARY CARE PHYSICIAN:  Willow Ora, MD.   CHIEF COMPLAINT:  Chest pain.   HISTORY OF PRESENT ILLNESS:  The patient is a 60 year old black female  with past medical history significant for diabetes, hypertension, GERD,  sleep apnea, osteoarthritis and status post left total knee replacement  in March of 2009 who presents with the above complaints.  She states  that for the past 2 weeks she has had chest pain daily and she saw her  primary care physician on November 23, 2007, and at that time her Protonix  dose was increased to b.i.d.  She has continued to have the chest pain,  and today it got worse, and so she came to the ER.  When asked to show  the location of her pain.  She points to her epigastrium and states that  it also goes around under her breast and through to her back.  It is  sharp, 9/10 in intensity at its worst and lasting a few minutes at a  time.  She admits to associated shortness of breath.  She also states  that for some time she has had intermittent shortness of breath as well  as PND, but today the shortness of breath was worse with the chest pain.  She states that the pain also radiates to her right shoulder.  She  denies nausea and vomiting, diaphoresis, fevers, cough, hemoptysis,  melena and no hematochezia.  She reports that she uses two pillows  and  that this is unchanged.   The patient was seen in the ER and point of care markers negative times  one.  A lipase was done which is within normal limits at 39, chest x-ray  showed cardiomegaly but no acute disease.  She is admitted for further  evaluation and  management.   PAST MEDICAL HISTORY:  As above.   MEDICATIONS:  1. Aspirin 325 mg daily.  2. KCl 10 mEq daily.  3. Metoprolol 75 mg b.i.d.  4. Taztia XT 360 mg daily.  5. Benazepril HCTZ 20/12.5 mg 3 tablets daily.  6. Micardis 40 mg daily.  7. Crestor 420 mg daily.  8. Glucophage 1000 mg b.i.d.  9. Humalog 75/25/60 units subcu q. a.m. and 40 units subcu q.p.m.  10.Protonix 40 mg b.i.d.   SOCIAL HISTORY:  She quit tobacco many years ago after smoking for 5-6  years.   FAMILY HISTORY:  Her mother is deceased, had diabetes and a stroke.  Her  brother and sister with diabetes and hypertension.   REVIEW OF SYSTEMS:  As per HPI.   PHYSICAL EXAMINATION:  GENERAL:  The patient is an obese, older black  female in no apparent distress.  VITAL SIGNS:  Temperature is 98.8, her blood pressure is  173/98,  initially 170/101, pulse is 88, respiratory rate of 20, O2 sat 98%.  HEENT:  PERRL, EOMI, sclerae anicteric, moist mucous membranes.  NECK:  Supple, no adenopathy, no thyromegaly, no JVD and no carotid  bruits.  LUNGS:  Clear decreased breath sounds at the bases, otherwise clear to  auscultation.  CARDIOVASCULAR:  Regular rate and rhythm.  Normal S1-S2, no S3  appreciated.  ABDOMEN:  Obese, bowel sounds present, she is tender in the epigastrium  and also in her right upper quadrant area, no rebound tenderness.  No  masses palpable and no organomegaly.  EXTREMITIES:  She has trace edema, no cyanosis.  NEUROLOGICAL:  Alert and oriented x3.  Cranial nerves II-XII grossly  intact.  Nonfocal exam.   LABORATORY DATA:  As per HPI., also her white cell count is 5.6 with a  hemoglobin of 12.2, hematocrit 36.8, platelet count of 300, neutrophil  count 57%.  Sodium is 139 with a potassium of 3.6, chloride 105, BUN 13,  creatinine 1.0, glucose of 117, ionized calcium is 1.03, point of care  markers negative times one and sodium is 141, potassium 3.5, chloride  105, CO2 of 28, glucose 104, BUN  11, creatinine 0.78, calcium 9.3, total  protein 7.0, albumin 3.6, AST 26, ALT 21, lipase is 39.   ASSESSMENT AND PLAN:  1. Chest pain.  As discussed above.  Will obtain serial cardiac      enzymes and repeat EKG in a.m., place on nitroglycerin p.r.n. and      aspirin.  Will also obtain a D-dimer given she is status post knee      surgery recently (March).  Will continue Protonix b.i.d. given her      history of GERD.  Also, as noted above, complaining of PND.  Will      obtain a brain atretic peptide followup, obtain 2-D echo if BNP      elevated.  Also, follow and consider right upper quadrant      ultrasound pending above studies, recheck LFTs in a.m.  As noted      above, lipase within normal limits at 39.  Cardiology consult in      a.m. pending above studies.  2. Uncontrolled hypertension.  Resume outpatient medications.  Monitor      blood pressures and further manage as appropriate.  3. GERD.  PPI.  4. Diabetes.  Continue Humalog, sliding scale coverage, follow.  Will      hold Glucophage for now.  5. Osteoarthritis.  Status post left total knee replacement in March      of 2009.  6. History of sleep apnea.      Kela Millin, M.D.  Electronically Signed     ACV/MEDQ  D:  12/08/2007  T:  12/08/2007  Job:  [id number]   cc:   Willow Ora, MD

## 2010-09-24 NOTE — Discharge Summary (Signed)
[name], [name]             ACCOUNT NO.:  [id number]   MEDICAL RECORD NO.:  [id number]          PATIENT TYPE:  INP   LOCATION:  4713                         FACILITY:  [facility]   PHYSICIAN:  Valerie A. Felicity Coyer, MDDATE OF BIRTH:  [birth year]   DATE OF ADMISSION:  12/08/2007  DATE OF DISCHARGE:  12/09/2007                               DISCHARGE SUMMARY   PRIMARY CARE PHYSICIAN:  Willow Ora, MD.   DISCHARGE DIAGNOSES:  1. Chest pain, thought musculoskeletal in nature.  2. Gastroesophageal reflux disease.  3. Uncontrolled hypertension.   HISTORY OF PRESENT ILLNESS:  [name] is a 60 year old female with  past medical history of hypertension, uncontrolled type 2 diabetes, and  hyperlipidemia, who presented to [facility] Emergency Room reporting 2-  week history of intermittent chest pain occurring in epigastric region  and radiating to right breast and back.  Due to the patient's risk  factors for CAD including hypertension, diabetes, and high lipid, the  patient was admitted to rule out cardiac etiology of chest pain.  However, after admission upon further discussion with the patient,  the  patient did report pain occurring on daily basis and quite worse with  movement and deep inspiration.  The patient also reported increased  orthopnea of late.   PAST MEDICAL HISTORY:  1. Hypertension.  2. Type 2 diabetes.  3. Hyperlipidemia.  4. Gastroesophageal reflux disease.  5. Osteoarthritis.  6. History of sleep apnea.   COURSE OF HOSPITALIZATION:  1. Atypical chest pain.  Again, low suspicion for cardiac etiology      given nature of symptoms.  However, due to the patient's risk      factor, she was admitted to telemetry to rule out cardiac etiology.      EKG performed at time of admission without any ischemia.  Cardiac      enzymes were negative x3.  The patient did have slightly elevated D-      dimer at which time a V/Q scan was obtained showing low probability      for  pulmonary embolism.  At this time, the patient's pain felt      musculoskeletal in nature.  However, due to reports of increased      orthopnea and mild cardiomegaly on chest x-ray, a 2D echo would be      performed and results followed by primary care physician on      followup appointment.  The patient did remain pain free during      hospitalization.   MEDICATIONS:  At the time of discharge:  1. Aspirin 325 mg days daily.  2. Potassium chloride 10 mEq p.o. daily.  3. Metoprolol 50 mg p.o. b.i.d.  4. Taztia XT  360 mg p.o. daily.  5. Benazepril/HCTZ 20/12.5 three tablets daily.  6. Micardis 40 mg p.o. daily.  7. Crestor 20 mg p.o. daily.  8. Glucophage 1000 mg p.o. b.i.d.  9. Protonix 40 mg p.o. daily.  10.Humalog 75/25 60 units q.a.m., 40 units q.p.m.   PERTINENT LAB WORK:  At time of discharge, sodium 140, potassium 3.5,  BUN 15, creatinine 0.98.  Liver function tests normal.  Cardiac enzymes  negative x3.  Again, 2D echo results pending at time of discharge.   DISPOSITION:  The patient felt medically stable for discharge home as  very low suspicion for cardiac etiology of chest pain, given negative  workup.  The patient is instructed to follow up with primary care  physician Dr. Willow Ora on August 5, at 10:45 a.m., at which time 2D  echocardiogram results can be reviewed.      Cordelia Pen, NP      Raenette Rover. Felicity Coyer, MD  Electronically Signed    LE/MEDQ  D:  12/31/2007  T:  01/01/2008  Job:  [id number]   cc:   Willow Ora, MD

## 2010-09-24 NOTE — Op Note (Signed)
NAMETANEAL, SONNTAG             ACCOUNT NO.:  [id number]   MEDICAL RECORD NO.:  [id number]          PATIENT TYPE:  INP   LOCATION:  1615                         FACILITY:  [facility]   PHYSICIAN:  Madlyn Frankel. Charlann Boxer, M.D.  DATE OF BIRTH:  1951/04/07   DATE OF PROCEDURE:  07/20/2007  DATE OF DISCHARGE:                               OPERATIVE REPORT   PREOPERATIVE DIAGNOSIS:  Left knee osteoarthritis.   POSTOPERATIVE DIAGNOSIS:  Left knee osteoarthritis.   PROCEDURE:  Left total knee replacement.   COMPONENTS USED:  DePuy rotating platform, posterior stabilized knee  system, size 3 femur, 4 tibia, 12.5 insert and a 38 patellar button.   SURGEON:  Madlyn Frankel. Charlann Boxer, M.D.   ASSISTANT:  Dwyane Luo, PA-C   ANESTHESIA:  Duramorph spinal.   DRAINS:  Times one.   COMPLICATION:  None.   BLOOD LOSS:  Minimal.   TOURNIQUET TIME:  50 minutes at 250 mmHg.   INDICATIONS FOR PROCEDURE:  Ms. Linville is a 60 year old female with a  history of right total knee replacement.  She presented office with end-  stage left knee arthritis and wished to proceed with the knee  replacement surgery.  She failed conservative measures at an outside  facility.  Risks and benefits were discussed about knee replacement  surgery including my postoperative protocols, expectations,  postoperative care and goals.  Consent was obtained.   PROCEDURE IN DETAIL:  The patient was brought to operative theater.  Once adequate anesthesia, preoperative antibiotics, Ancef, administered,  the patient was positioned supine with a proximal thigh tourniquet.  The  left lower extremity pre scrubbed and prepped and draped in sterile  fashion.  Leg was exsanguinated, tourniquet elevated.  Midline incision  was made followed by median arthrotomy.  Following initial debridement  which included synovectomy, attention was first directed to the patella.  Precut measurement was 24 mm.  I resected down to 15 mm using a 38  patellar button.   This got back the same height checking with the  calipers.  I then placed a metal shim to protect the cut surface of the  patella from retractors.  It was subluxated laterally.  Attention was  first directed to the femur.  The canal was opened with a drill and I  irrigated to prevent fat emboli.  An intramedullary rod was then passed.  At 5 degrees valgus I resected 11 mm of bone off the distal femur.  I  then sized the femur to be a size 3  Based on the posterior condylar  axis which was perpendicular to Whiteside's line, I placed a size 3  cutting block.  The anterior-posterior and chamfer cuts were all made  without difficulty.  No notching.   Further synovectomy carried out at this point. Final box cut was made.  I used a trial 3 to determine the location for the box cut, marked it  and then performed the box cut without difficulty.   At this point, the tibia was subluxated forward.  The meniscus was fully  debrided as was the stump of the cruciate ligaments.  I then  resected  using extramedullary guide 8 mm of bone off the high side lateral side.  I then checked with a spacer block and was happy that it at least came  out to extension thinking that I may go up to 12.5.  At this point the  cut surface of the tibial matched best with a size 4 tibia. I placed  this tray, pinned it  in position and checked with the alignment rod.  The alignment rod seemed to pass through the center of the ankle along  the anterior axis the tibia.   I then drilled and keel punched the tibia and performed a trial  reduction.  With a size 3 femur and 4 tibia and a 3 x 12.5 insert I was  happy the knee to came to full extension and the ligaments appeared to  be very stable from extension and flexion.  The patella tracked without  application of any pressure.   The trial components were removed.  The synovial capsule layer was  injected with 60 mL of 0.25% Marcaine with epinephrine and 1 mL of  Toradol.   Further synovectomy and debridement carried out as necessary.   Please note that the patient did have radiographically a lot of loose  bodies in the posterior aspect of the knee prior to my final drilling  and keel punching.  I did use a five-eighths curved osteotome to debride  off the posterior condyles, removed several large piece of bone and  osteophytes.  This was palpably debrided after this procedure was done.  At this point the cement was mixed and as the final components were  opened, the final components were cemented into position.  The knee was  brought to extension with 12.5 insert.   Once the cement had cured and excessive cement was removed using  osteotome and suction and clamps.   The tourniquet was let down at 50 minutes.  A medium Hemovac drain was  placed. The knee was irrigated again at this point with pulse lavage.  The extensor mechanism was then reapproximated using #1 Vicryl.  The  remaining wound was closed with 2-0 Vicryl and a running 4-0 Monocryl.  The knee was cleaned, dried, dressed sterilely with Steri-Strips and  bulky sterile wrap.  She was brought to recovery room in stable  condition tolerating the procedure well.      Madlyn Frankel Charlann Boxer, M.D.  Electronically Signed     MDO/MEDQ  D:  07/20/2007  T:  07/21/2007  Job:  [id number]

## 2010-09-24 NOTE — H&P (Signed)
NAMEBASSHEVA, [name]             ACCOUNT NO.:  [id number]   MEDICAL RECORD NO.:  [id number]         PATIENT TYPE:  INP   LOCATION:                               FACILITY:  [facility]   PHYSICIAN:  Madlyn Frankel. Charlann Boxer, M.D.  DATE OF BIRTH:  20-Aug-1950   DATE OF ADMISSION:  07/20/2007  DATE OF DISCHARGE:                              HISTORY & PHYSICAL   PROCEDURE:  Left total knee replacement.   CHIEF COMPLAINT:  Left knee pain.   HISTORY OF PRESENT ILLNESS:  A 60 year old female with a history of left  knee pain secondary to osteoarthritis.  It has been refractory to all  conservative treatment including oral anti-inflammatories, cortisone  injections.  She does have a previous history of right total knee  replacement in 2005, and has done fairly well with this.   PAST MEDICAL HISTORY:  Significant for:   1. Osteoarthritis.  2. Hypertension.  3. Diabetes insulin-dependent.  4. Sleep apnea.   PAST SURGICAL HISTORY:  Right total knee replacement in 2005 requiring a  size #3 femur, size #4 tibial tray, 10 mm inserted in patella button.   FAMILY HISTORY:  Hypertension, diabetes, stroke.   SOCIAL HISTORY:  She is widowed.   PRIMARY CAREGIVER:  Primary caregiver after surgery will be her son,  Mirela Parsley.   DRUG ALLERGIES:  No known drug allergies.   MEDICATIONS:  1. Benazepril HCTZ 20/12.5 mg one p.o. b.i.d.  2. Crestor 20 mg tab one p.o. daily.  3. Metoprolol ER 50 mg one p.o. b.i.d.  4. Diltiazem unknown dosage or frequency.  5. Micardis 20 mg one p.o. daily.  6. K-Dur 10 mEq one p.o. daily.  7. Lorcet Plus 7.5/650 one p.o. q.6 p.r.n. pain.  8. Humalog 75/25 pen 70 units in the morning, 40 units in the evening.   REVIEW OF SYSTEMS:  HEMATOLOGY:  Easily bruises.  NEUROLOGY:  Has some  blurred vision.  PULMONARY:  Sleep apnea with shortness of breath.   PHYSICAL EXAMINATION:  VITAL SIGNS:  Respirations 18, blood pressure  128/82.  GENERAL:  Awake, alert, and oriented,  well-developed, well-nourished, in  no acute distress.  NECK:  Supple.  No carotid bruits.  CHEST:  Lungs are clear to auscultation bilaterally.  BREASTS:  Deferred.  HEART:  Regular rate and rhythm.  S1, S2 distinct.  ABDOMEN:  Soft, nontender.  EXTREMITIES:  She does have positive dorsalis pedis pulse, left knee  comes out to full extension with flexion to 110 degrees.  SKIN:  No cellulitis noted.  NEUROLOGIC:  Intact distal sensibilities.   LABORATORY DATA:  EKG, chest x-ray, all pending presurgical testing.   IMPRESSION:  Left knee osteoarthritis.   PLAN OF ACTION:  Left total knee arthroplasty at [facility] on  July 20, 2007 by surgeon Dr. Durene Romans.  Risks and complications  were discussed.  Postoperative medications including Lovenox, Robaxin,  iron, aspirin, Colace, and MiraLax will be provided at the time of  History and Physical.  Pain medicine will be provided at the time of  surgery.     ______________________________  Yetta Glassman [address], [place]  Madlyn Frankel Charlann Boxer, M.D.  Electronically Signed    BLM/MEDQ  D:  07/08/2007  T:  07/09/2007  Job:  [id number]   cc:   Willow Ora, MD  [phone] [place], [place] [address]

## 2010-09-27 NOTE — Discharge Summary (Signed)
[name], [name] NO.:  [id number]   MEDICAL RECORD NO.:  [id number]          PATIENT TYPE:  IPS   LOCATION:  4001                         FACILITY:  [facility]   PHYSICIAN:  Ranelle Oyster, M.D.DATE OF BIRTH:  1950-05-25   DATE OF ADMISSION:  11/27/2003  DATE OF DISCHARGE:  12/06/2003                                 DISCHARGE SUMMARY   DISCHARGE DIAGNOSES:  1.  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  2.  Pain management.  3.  Coumadin for deep venous thrombosis prophylaxis.  4.  Insulin-dependent diabetes mellitus.  5.  Hypertension.   HISTORY OF PRESENT ILLNESS:  A 60 year old female admitted November 22, 2003,  end-stage changes of the right knee and no change with conservative care.  Underwent a right total knee replacement November 22, 2003, per Dr. Luiz Blare.  Placed on Coumadin for deep venous thrombosis prophylaxis, weightbearing as  tolerated.  Hospital course uneventful.   PAST MEDICAL HISTORY:  See discharge diagnoses.   ALLERGIES:  No known drug allergies.   SOCIAL HISTORY:  [place]  Unemployed.  One level home with six steps to entry.  Son works shift work.  Local niece  to check as needed.   MEDICATIONS PRIOR TO ADMISSION:  1.  70/30 insulin.  2.  Toprol XL 100 mg daily.  3.  Crestor 20 mg daily.  4.  Glucophage.  5.  Avandia.  6.  Aspirin 325 daily.   HOSPITAL COURSE:  The patient with progressive gains while in rehab services  with therapies initiated on a b.i.d. basis.  The following issues are  followed during the patient's rehab course.  Pertaining to [name]'s  right total knee replacement, surgical site healing nicely.  No signs of  infection.  She was ambulating with a walker extended distances  weightbearing as tolerated. Home health therapies had been arranged.  Pain  control with the use of OxyContin that was tapered; at the time of  discharge, was oxycodone for breakthrough pain.  She remained on Coumadin  for deep venous thrombosis prophylaxis.  She would complete Coumadin  protocol December 23, 2003, followed by [facility].  Her blood  sugars remained within acceptable limits on 70/30 insulin 30 units in the  a.m. and 20 units in p.m.  Home regimens of Toprol, Lotensin and  hydrochlorothiazide for her blood pressure which did not exhibit any  orthostatic changes. She had no headache, no dizziness.  She was ambulating  household functional distances with a walker and was discharged to home with  home health therapies.  During her rehab course, she had some urinary  retention.  She was seen in consult by Dr. Ivette Loyal, noted high post void  residuals of 1100.  A Foley catheter tube was placed with plans for  urodynamics as an outpatient with urology.  Home health therapies would  continue to provide care for Foley catheter.   DISCHARGE MEDICATIONS:  1.  Coumadin 5 mg daily until December 23, 2003, then stop.  2.  Toprol XL 100 mg daily.  3.  Crestor 20 mg daily.  4.  Avandia 4 mg daily.  5.  Cartia 360 mg daily.  6.  Benazepril 40 mg daily.  7.  Hydrochlorothiazide 25 mg daily.  8.  Insulin 70/30 30 units in a.m. and 20 units in p.m.  9.  OxyContin sustained release taper as advised.  10. Oxycodone for breakthrough pain.   SPECIAL INSTRUCTIONS:  Home health nurse to check INR on Thursday, December 07, 2003.  Foley catheter tube per home health nurse.  Follow-up with Dr. Logan Bores  of urology, [phone], one to two weeks.  Check bladder function.  Dr.  Luiz Blare, orthopedic services.       DA/MEDQ  D:  03/11/2004  T:  03/11/2004  Job:  [id number]

## 2010-09-27 NOTE — Op Note (Signed)
NAME:  [name], [name]                       ACCOUNT NO.:  [id number]   MEDICAL RECORD NO.:  [id number]                   PATIENT TYPE:  INP   LOCATION:  2550                                 FACILITY:  [facility]   PHYSICIAN:  Harvie Junior, M.D.                DATE OF BIRTH:  Aug 07, 1950   DATE OF PROCEDURE:  11/22/2003  DATE OF DISCHARGE:                                 OPERATIVE REPORT   PREOPERATIVE DIAGNOSIS:  End-stage degenerative joint disease right knee.   POSTOPERATIVE DIAGNOSIS:  End-stage degenerative joint disease right knee.   PROCEDURE:  Right total knee replacement with Sigma system size 3 cemented  femur, size 4 cemented keel tibia, 10 mm bridging bearing, 38 mm all poly  patella.   SURGEON:  Harvie Junior, M.D.   ASSISTANT:  Marshia Ly, P.A.   ANESTHESIA:  General.   INDICATIONS FOR PROCEDURE:  This is a 60 year old female with a long history  of having bilateral severe degenerative joint disease being treated  conservatively with anti-inflammatory medications and injection therapy.  Having failed all of this, she ultimately was taken to the operating room  for total knee replacement.   DESCRIPTION OF PROCEDURE:  The patient was taken to the operating room after  adequate anesthesia obtained with a general anesthesia, the patient was  placed on the operating table and the right leg was prepped and draped in  the usual sterile fashion.  Following this, the leg was exsanguinated and  blood pressure tourniquet was inflated to 350 mmHg.  Following this, the  midline incision was made.  Subcutaneous tissue was dissected down to the  level of the extensor mechanism.  Medial parapatellar arthrotomy was  undertaken and the knee was exposed.  Medial and lateral meniscectomies were  performed as well as fat pad excision and anterior and posterior cruciate  excision.  At this time, attention was turned toward the proximal tibia  where the tibia was cut perpendicular  to the long axis.  Attention was then  turned to the distal femur where the distal femur was cut perpendicular to  its long axis.  An 11 shin was then put into place and full extension could  be achieved.  Attention was then turned to the distal femur where the femur  was then sized to a size 3.  Anterior and posterior cuts were made and then  a size 3 block was used for chamfers and chamfers were then cut.  Attention  was then turned toward the box cutting where a size 3 box was then cut.  Attention was then turned to the tibia which was sized to a size 4 and this  was put in place, cemented, and keeled.  The keel was then drilled and the  portions of the keel were then hammered in place.  The trial components were  then put in place with a 10 mm bridging bearing, easy to  full extension, no  instability, good flexion with no tendency toward instability.  Attention  was then turned toward the patella where it was cut down to a level of 14  and a 38 mm paddle was used and the areas were drilled for this.  Following  this, the trial patella was put in place with excellent midline tracking,  full range of motion, no instability.  All trial components were removed at  this point.  The final components were cemented in place.  All excess bone  cement was removed.  The 10 mm bridging bearing was put in place and the  cement was allowed to harden and then the medial parapatellar arthrotomy was  closed over  a medium Hemovac.  The skin was closed with 0 and 2-0 Vicryl and the skin  with skin staples.  A sterile compression dressing was applied as well as a  knee immobilizer.  The patient was taken to the recovery room where she was  noted to be in satisfactory condition.  Estimated blood loss for the  procedure was none.                                               Harvie Junior, M.D.    Ranae Plumber  D:  11/22/2003  T:  11/23/2003  Job:  [id number]

## 2010-09-27 NOTE — Op Note (Signed)
[name], [name]                         ACCOUNT NO.:  [id number]   MEDICAL RECORD NO.:  [id number]                   PATIENT TYPE:  AMB   LOCATION:  DSC                                  FACILITY:  [facility]   PHYSICIAN:  Harvie Junior, M.D.                DATE OF BIRTH:  1950-12-28   DATE OF PROCEDURE:  03/15/2003  DATE OF DISCHARGE:                                 OPERATIVE REPORT   PREOPERATIVE DIAGNOSIS:  Medial meniscal tear with degenerative  patellofemoral joint.   POSTOPERATIVE DIAGNOSIS:  Medial meniscal tear with degenerative  patellofemoral joint.   PROCEDURE:  1. Debridement of medial meniscal tear.  2. Take down of medial patellar osteophyte and medial femoral condylar     osteophyte.   SURGEON:  Harvie Junior, M.D.   ASSISTANT:  Marshia Ly, P.A.   ANESTHESIA:  General.   BRIEF HISTORY:  60 year old female with a long history of having significant  bilateral knee pain.  We have followed her long term with this.  Injection  therapy has helped but had recurrence of the pain.  At this point because of  recurrence of pain we ultimately talked about treatment options including  knee replacement versus knee arthroscopy.  She did not want to proceed with  knee replacement.  We felt that trying knee arthroscopy was reasonable given  her young age and that we would attempt this on one side only and see what  kind of relief she got.  If she got excellent relief, we could do the other  side.  If she did not, then we would probably think about knee replacement.  We discussed this with her and brought her to the operating room for this  procedure.   DESCRIPTION OF PROCEDURE:  The patient was taken to the operating room and  after undergoing general anesthesia, the patient was placed on the operating  table, the left leg was prepped and draped in the usual sterile fashion.  Following this, routine arthroscopic examination of the knee revealed there  was an obvious  grade 3 deterioration of the patellofemoral joint.  There was  a large osteophyte on the medial patellar facet and a large corresponding  osteophyte on the medial femoral condyle.  A motorized bur was opened and  the motorized bur was used to take down the osteophyte on the patellar facet  as well as on the medial femoral condyle.  Attention was then moved into the  medial femoral compartment where a motorized bur was used to take down  osteophytes on the medial femoral condyle.  The attention was turned to the  medial compartment where a posterior horn medial meniscal tear was  identified and a biter was used to debride the posterior horn of the medial  meniscus.  At this point, attention was turned to the anterior cruciate  which was normal, the lateral side showed no significant abnormality.  I  backed up into the patellofemoral joint, a final check was made.  The  osteophyte which were meeting with flexion before were now completely freed  up.  The knee was copiously irrigated and suctioned dry.  A final check was  made for any loose fragments or pieces of cartilage.  Seeing none, a sterile  compressive dressing was applied.  The patient was taken to the recovery  room and was noted to be in satisfactory condition.  Estimated blood loss  was none.                                              Harvie Junior, M.D.   Ranae Plumber  D:  03/15/2003  T:  03/15/2003  Job:  [id number]

## 2010-09-27 NOTE — Consult Note (Signed)
[facility]                          ENDOCRINOLOGY CONSULTATION   [name], [name]                    MRN:          [id number]  DATE:08/12/2006                            DOB:          03-03-1951    REASON FOR VISIT:  Followup diabetes.   HISTORY OF PRESENT ILLNESS:  A 60 year old woman who states her glucoses  are highest in the morning when they are in the high 100s.  They are in  the low 100s at all other times of day.   PAST MEDICAL HISTORY:  1. Osteoarthritis of the spine.  2. Hypertension.  3. GERD.  4. Dyslipidemia.  5. Mild CAD.   REVIEW OF SYSTEMS:  Denies hypoglycemia.   PHYSICAL EXAMINATION:  VITAL SIGNS:  Blood pressure 140/89, heart rate  84, temperature is 97.4, the weight is 315.  GENERAL:  No distress.  EXTREMITIES:  One plus bilateral pretibial edema.   IMPRESSION:  Ongoing improvement in her glucose control, but she still  seems to need some adjustment.   PLAN:  1. Increase NPH insulin to 45 units q.h.s.  2. Continue Humalog 30 breakfast, 25 lunch, 35 supper, and 15 if you      eat an h.s. snack.  3. Return here in about 2 months.     Sean A. Everardo All, MD  Electronically Signed    SAE/MedQ  DD: 08/13/2006  DT: 08/13/2006  Job #: [id number]   cc:   Willow Ora, MD

## 2010-09-27 NOTE — Consult Note (Signed)
[facility]                            ENDOCRINOLOGY CONSULTATION   TALLULA, GRINDLE                    MRN:          [id number]  DATE:12/19/2005                            DOB:          02-20-1951    REFERRING PHYSICIAN:  Dr. Willow Ora, [place], [place], [place].   REASON FOR REFERRAL:  Diabetes and thyroid.   HISTORY OF PRESENT ILLNESS:  This is a 60 year old woman with an 8-year  history of type 2 diabetes complicated by mild coronary disease.  She has  been on insulin for 6 years and now takes 70/30 insulin, 45 units in the  morning and 35 units in the afternoon.  She states that her glucoses  continue to be elevated, especially later in the day.  Symptomatically, she  has several years of slight numbness of the feet with associated chronic  chest pain for which she was found to have only mild CAD on evaluation.   PAST MEDICAL HISTORY:  1. Gastroesophageal reflux disease.  2. Hypertension.  3. Dyslipidemia.   SOCIAL HISTORY:  She is widowed and she is pursuing getting on disability.   FAMILY HISTORY:  Positive for diabetes in her mother and two siblings.   REVIEW OF SYSTEMS:  She does have chronic weight gain and she denies  hypoglycemia.   PHYSICAL EXAMINATION:  VITALS:  Blood pressure 151/97.  Heart rate 101.  Temperature 97.4.  Weight 315.  GENERAL:  No distress.  She is obese.  SKIN:  Not diaphoretic.  I do not see a rash.  HEENT:  No ptosis.  No periorbital swelling.  Pharynx has no erythema.  NECK:  There is a question of a 3 cm right-sided thyroid nodule, although  her examination is limited by obesity.  CHEST:  Clear to auscultation.  No respiratory distress.  CARDIOVASCULAR:  No JVD.  No edema.  Regular rate and rhythm.  No murmur.  Pedal pulses are intact and there is no bruit at the carotid arteries.  FEET:  Normal color and temperature.  There is no ulcer present on the feet.  NEUROLOGIC:  Alert, well-oriented.   Does not appear anxious or depressed.  Sensation is slightly decreased to touch up on the feet.   LABORATORY STUDIES:  Forwarded by Dr. Drue Novel.  On December 01, 2005, hemoglobin A1c  9.7.  Urine micro albumin is normal on August 21, 2005.  LDL cholesterol 131  on May 23, 2005.   IMPRESSION:  1. Type 2 diabetes.  I agree with Dr. Drue Novel that she needs an increase in      her therapy.  2. History of noncardiogenic chest pain.  3. Possibility of right-sided thyroid nodule, incidentally noted on      examination.  4. She has other risk factors of hypertension and dyslipidemia.   PLAN:  1. The patient is advised to make an appointment back with Dr. Drue Novel to      address her hypertension and dyslipidemia.  I will work with her      diabetes and thyroid to free him up to address these other risk  factors.  2. Check thyroid ultrasound.  3. I discussed with the patient the fact that because of the complexity of      diabetes, we will need to take her care in stages.  So, for now she      will increase her 70/30 insulin to 55 units in the morning and 35 units      in the afternoon.  She will return in two weeks when it is possible      that I would change her to a different type of insulin schedule.                                   Sean A. Everardo All, MD   SAE/MedQ  DD:  12/21/2005  DT:  12/21/2005  Job #:  [id number]   cc:   Willow Ora, MD

## 2010-09-27 NOTE — Discharge Summary (Signed)
NAME:  [name]                       ACCOUNT NO.:  [id number]   MEDICAL RECORD NO.:  [id number]                   PATIENT TYPE:  INP   LOCATION:  5028                                 FACILITY:  [facility]   PHYSICIAN:  Harvie Junior, M.D.                DATE OF BIRTH:  [birth year]   DATE OF ADMISSION:  11/22/2003  DATE OF DISCHARGE:  11/27/2003                                 DISCHARGE SUMMARY   ADMISSION DIAGNOSES:  1.  End-stage degenerative joint disease right knee.  2.  Insulin-dependent diabetes mellitus.  3.  Hypertension.  4.  Hypercholesterolemia.   DISCHARGE DIAGNOSES:  1.  End-stage degenerative joint disease right knee.  2.  Insulin-dependent diabetes mellitus.  3.  Hypertension.  4.  Hypercholesterolemia.   PROCEDURE:  Right total knee arthroplasty by Dr. Harvie Junior on November 22, 2003.   BRIEF HISTORY:  [name] is a 60 year old female who has a long  history of right knee pain, standing x-rays of the right knee showed bone on  bone arthritis.  She has night pain and pain with ambulation.  She has  gotten only temporary relief with conservative treatment including rest, use  of anti-inflammatories, and injection therapy.  Based on her clinical and  radiographic findings, she was admitted for a right total knee arthroplasty.   LABORATORY DATA:  Pertinent laboratory studies show a preoperative x-ray of  the right knee with a tricompartmental degenerative joint disease noted with  ossific densities along the posterior knee joint.  Postoperative x-rays of  the right knee showed no adverse features following total knee arthroplasty.  Chest x-ray preoperatively showed no active disease.   Hemoglobin on admission was 13.1, hematocrit 38.7, indices were within  normal limits.  Postoperative day one, hemoglobin was 11.6; day two,  hemoglobin was 11.0; day three, hemoglobin was 11.0.  Pro time on admission  was 13.1 seconds with an INR of 1.0, PTT 28.  On  the date of discharge, her  pro time was 17.3 seconds with an INR of 1.6 on Coumadin therapy.  CMET on  admission was unremarkable with normal findings.  BMET on postoperative day  one showed an elevated glucose of 207.  BMET on postoperative day two showed  an elevated glucose of 187.  Urinalysis on admission showed trace of  leukocyte esterase and rare epithelials.  She did have some yeast noted.   HOSPITAL COURSE:  The patient underwent right total knee arthroplasty as  well described in Dr. Harvie Junior' operative note on November 22, 2003.  Postoperatively, she was put on a PCA and morphine pump for pain control.  IV Ancef 1 gm IV q.8h. x 5 doses prophylactically.  CPM machine was ordered,  IV fluids and sliding scale insulin.   On postoperative day one, she had moderate right knee pain.  She was taking  fluids without difficulty.  Her vital signs were  stable.  Her pO2 were 98%  on two liters of oxygen.  Her hemoglobin was 11.6, INR was 1.1, and her  potassium was 3.7.  She was gotten out of bed to the chair with physical  therapy, weightbearing as tolerated on the right side.  Coumadin was started  immediately the evening of surgery for DVT prophylaxis.   On postoperative day two, she had moderate right knee pain.  She was taking  fluids without difficulty.  She had spiked a fever up to 101.3, hemoglobin  was 11.0, INR was 1.2.  Her right knee dressing was changed and her Hemovac  drain was pulled.  Her wound was benign.  She was then started on  subcutaneous Lovenox until her INR was greater than 2.0.  Her IV was  converted to a saline lock.  Her blood sugars were elevated at 187.  We felt  we would monitor this carefully.   Postoperative day three, she had increasing pain in her right knee.  She was  slow to move her knee despite aggressive physical therapy.  We obtained x-  ray of the right knee and it was unremarkable and a rehabilitation consult  was obtained.    Postoperative day four, she overall looked good but had significant pain.  She was taking p.o. and voiding without difficulty.  She was somewhat slow  with therapies and she was felt to be an excellent candidate for inpatient  rehabilitation.  The patient was agreeable to a rehabilitation stay.   On postoperative day five, she was transferred to the inpatient  rehabilitation unit at [facility].   DISCHARGE MEDICATIONS:  Her medications on discharge will be all the  medications that she came to the hospital on with the addition of Percocet 5  mg 2 q.4h. p.r.n. pain and Coumadin per pharmacy protocol x 1 month  postoperatively with Lovenox 30 mg b.i.d. until INR 2.0 or greater.   ACTIVITY:  Activity status at discharge will be weightbearing as tolerated  on the right with a walker.  Continue use of CPM machine for knee range of  motion.   CONDITION ON DISCHARGE:  Improved.   DIET:  Regular diabetic diet.   FOLLOW UP:  We will follow her while in the inpatient rehabilitation unit.      [name], P.A.                       Harvie Junior, M.D.    [name]  D:  01/17/2004  T:  01/17/2004  Job:  [id number]   cc:   Wanda Plump, MD [facility]  [phone] [address] [place], [place] [address]

## 2010-09-27 NOTE — Discharge Summary (Signed)
NAME:  [name], [name]                       ACCOUNT NO.:  [id number]   MEDICAL RECORD NO.:  [id number]                   PATIENT TYPE:  IPS   LOCATION:  4001                                 FACILITY:  [facility]   PHYSICIAN:  Ranelle Oyster, M.D.             DATE OF BIRTH:  04/25/1951   DATE OF ADMISSION:  11/27/2003  DATE OF DISCHARGE:  12/05/2003                                 DISCHARGE SUMMARY   ADDENDUM:  Noted patient with ongoing urinary retention noted post void  residuals had increased from 450 to 700 mL.  Due to this urinary retention,  her discharge was delayed until followup per urology services.  Consult made  to Dr. Logan Bores with followup per his partner Dr. Ivette Loyal advised insertion of  Foley catheter tube.  The patient had been on Urecholine and Flomax.  This  was discontinued after tube inserted.  It was felt that she should follow up  with urology office in one to two weeks to check urodynamics which could  take place in Dr. Logan Bores office.  All issues had been discussed with patient.  It was felt she could be discharged on December 06, 2003.  Remaining hospital  course remained unremarkable.  She was discharged to home.  Please refer to  body of history and physical dictated December 05, 2003 for all other  information.      Mariam Dollar, P.A.                     Ranelle Oyster, M.D.    DA/MEDQ  D:  12/06/2003  T:  12/06/2003  Job:  [id number]

## 2010-09-27 NOTE — Consult Note (Signed)
NAME:  [name], [name]                       ACCOUNT NO.:  [id number]   MEDICAL RECORD NO.:  [id number]                   [name] TYPE:  IPS   LOCATION:  4001                                 FACILITY:  [facility]   PHYSICIAN:  Thyra Breed, MD                      DATE OF BIRTH:  1950/12/22   DATE OF CONSULTATION:  12/05/2003  DATE OF DISCHARGE:                                   CONSULTATION   REASON FOR CONSULTATION:  Urinary retention.   HISTORY OF PRESENT ILLNESS:  [name] is a very pleasant 60 year old  African-American female who is status post right knee replacement on November 22, 2003.  We were asked to evaluate her at this time secondary to urinary  retention.  [name] states she has been having difficulty for some time  voiding prior to entry into Melissa hospital.  Specifically, she has problems  with urinary frequency, occasional dysuria and incomplete voiding.  She  denies any hematuria.  She does have some stress incontinence with coughing,  laughing, sneezing.  Since her most recent surgery on July 13, she has been  managed by in and out caths with residual urines in Melissa range of 500 to 1100  mL.  Again, this morning, she felt some discomfort and a Foley catheter was  replaced as her discharge is pending.  Her past medical history is  significant for diabetes mellitus and hypertension.  In addition, she is  obese.   PAST MEDICAL HISTORY:  1. Diabetes mellitus.  2. Hypertension.  3. Hyperlipidemia.  4. Degenerative joint disease.  5. Obesity.   PAST SURGICAL HISTORY:  1. Right knee replacement.  2. Drainage of rectal abscess.   FAMILY HISTORY:  Negative for a history of genitourinary malignancy.  Positive for diabetes mellitus and hypertension.   SOCIAL HISTORY:  [name] has smoked in Melissa past but no longer uses  tobacco.  She drinks alcohol occasionally and denies any illicit drug abuse.   MEDICATIONS:  1. Lipitor.  2. Lotensin.  3. Urecholine.  4.  Diltiazem.  5. Hydrochlorothiazide.  6. Insulin.  7. Glucophage.  8. Metoprolol.  9. Avandia.  10.      OxyCodone.  11.      Flomax.  12.      Trinsicon.  13.      Coumadin.   ALLERGIES:  No known drug allergies.   REVIEW OF SYSTEMS:  As per history of present illness.  All other systems  reviewed were negative.   PHYSICAL EXAMINATION:  VITAL SIGNS:  Temperature 98.7, heart rate 82, blood  pressure 127/86, respiratory rate 20.  Oxygen saturations 93% on room air.  GENERAL:  Obese, normal-appearing.  HEENT:  Normocephalic and atraumatic.  Pupils equal, round and reactive to  light and accommodation.  Extraocular movements are intact.  Melissa oropharynx  is clear.  NECK:  Supple, no lymphadenopathy.  There  is no jugular venous distention.  HEART:  Cardiovascular was regular rate and rhythm, no murmurs, rubs or  gallops.  PULMONARY:  Clear to auscultation bilaterally.  ABDOMEN:  Obese, soft, nontender, nondistended with positive bowel sounds.  GU:  Pelvic deferred.  EXTREMITIES:  No cyanosis, clubbing or edema.  Well healing incision on Melissa  anterior aspect of Melissa right knee from recent surgery.  NEURO:  Cranial nerves 2-12 intact.  Sensory is grossly nonfocal.  Motor is  5/5 throughout.   LABORATORY DATA:  Urinalysis obtained on July 20 was dipstick negative for  glucose, bilirubin, ketone, blood, protein, nitrite or leukocytes.  Urine pH  ws 6.0.  Urine culture obtained at Melissa same time was negative.  CBC showed a  white blood cell count 4700, hemoglobin 10.7, hematocrit 31.7 and platelet  count 370,000.  Basic metabolic panel showed a creatinine of 0.7.  Liver  function tests were within normal limits.   IMPRESSION:  [name] is a 60 year old obese African-American female  status post knee replacement approximately two weeks ago.  She has suffered  from postoperative urinary retention requiring in and out catheterization  with residuals as high as 1100 mL.  Interestingly,  she has been having  difficulty voiding at some times with complaints of urinary frequency,  dysuria and incomplete voiding.  She is known diabetic now requiring  insulin.  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  She has a Foley catheter in place at this time and will  likely be discharged tomorrow morning.   RECOMMENDATIONS:  1. Will leave Foley catheter in place and discharge [name] with Foley     catheter and a urinary leg bag.  2. We will likely obtain urodynamics as an outpatient to further delineate     Melissa etiology of her urinary retention.  3. Follow-up with Dr. Logan Bores in one to two weeks.                                               Thyra Breed, MD    EG/MEDQ  D:  12/05/2003  T:  12/05/2003  Job:  [id number]

## 2010-09-27 NOTE — Cardiovascular Report (Signed)
NAMEALTAIR, [name] NO.:  [id number]   MEDICAL RECORD NO.:  [id number]                   PATIENT TYPE:  OIB   LOCATION:  2899                                 FACILITY:  [facility]   PHYSICIAN:  Vida Roller, M.D.                DATE OF BIRTH:  1950-07-16   DATE OF PROCEDURE:  DATE OF DISCHARGE:                              CARDIAC CATHETERIZATION   PRIMARY CARDIOLOGIST:  Veneda Melter, M.D.   REASON FOR EVALUATION:  This is a 60 year old African-American woman with  morbid obesity with risk factors of diabetes, hypertension and  hyperlipidemia as well as lower extremity orthopedic issues who presents to  be seen regarding an atypical chest discomfort.  She was referred for a  perfusion study which showed a scar in her inferior wall with preserved left  ventricular function and she was referred for elective heart catheterization  via the right radial approach.   DETAILS OF THE PROCEDURE:  Following obtaining informed consent, the patient  was brought the cardiology catheterization laboratory in a fasting state.  There she was prepped and draped in the usual sterile manner and conscious  sedation was obtained with Versed and fentanyl.  The right wrist was  anesthetized using 1% lidocaine without epinephrine and the right radial  artery was cannulated using a modified Seldinger technique with a 6 French  10 cm sheath.  A left heart catheterization was performed using a 6 French  Judkins right #4 and a 6 [place] al1 catheter.  A left ventriculography was  performed using a pigtail catheter.  At the conclusion of the procedures the  catheters were removed, the sheath was removed, hemostasis was obtained with  a Radstat device, perfusion was confirmed in the distal index and thumb as  well as middle finger with a pulse oximetry, and the patient was taken to  the cardiac holding area without complication.   RESULTS:  Aortic pressure is 148/95 with a mean  arterial pressure of 120,  left ventricular pressure 154/9 with an end diastolic pressure of 13 mm of  mercury.   CORONARY ANATOMY:  The left main coronary artery is a moderate-caliber  vessel which bifurcates into a moderate-caliber left anterior descending and  a moderate-caliber circumflex coronary artery.  The left anterior second  coronary artery has two diagonals.  The circumflex coronary artery has four  obtuse marginals, one of which bifurcates.  The remainder of the circumflex  coronary artery in the AV groove is small.  The entire left system is  angiographically free of disease.   The right coronary artery is a moderate-caliber dominant vessel with a  single posterolateral branch; it is angiographically normal.   Left ventriculography reveals an ejection fraction estimated at 60% with no  mitral regurgitation and no wall motion abnormalities.   ASSESSMENT:  This is a woman with nonobstructive coronary artery disease  with a normal left ventricular function.  Concentration should be paid  to a  secondary prophylaxis as she has diabetes.                                               Vida Roller, M.D.   JH/MEDQ  D:  12/07/2002  T:  12/07/2002  Job:  [id number]

## 2010-10-21 ENCOUNTER — Encounter: Payer: Self-pay | Admitting: Internal Medicine

## 2010-10-22 ENCOUNTER — Ambulatory Visit (INDEPENDENT_AMBULATORY_CARE_PROVIDER_SITE_OTHER): Payer: Medicare Other | Admitting: Internal Medicine

## 2010-10-22 ENCOUNTER — Encounter: Payer: Self-pay | Admitting: Internal Medicine

## 2010-10-22 DIAGNOSIS — I10 Essential (primary) hypertension: Secondary | ICD-10-CM

## 2010-10-22 DIAGNOSIS — R079 Chest pain, unspecified: Secondary | ICD-10-CM

## 2010-10-22 DIAGNOSIS — E119 Type 2 diabetes mellitus without complications: Secondary | ICD-10-CM

## 2010-10-22 DIAGNOSIS — M2550 Pain in unspecified joint: Secondary | ICD-10-CM

## 2010-10-22 NOTE — Assessment & Plan Note (Signed)
Shoulder pain 04-2010 better after PT

## 2010-10-22 NOTE — Progress Notes (Signed)
  Subjective:    Patient ID: [name], female    DOB: [birth year], 60 y.o.   MRN: [id number]  HPI Routine office visit Was seen w/ aches and pains few montrhs ago, specifically had  shoulder pain: got better after PT  Past Medical History  Diagnosis Date  . DIABETES MELLITUS 10/06/2006  . HYPERLIPIDEMIA 01/11/2009  . UNSPECIFIED ANEMIA 12/10/2009  . DEPRESSION 09/26/2008  . OBSTRUCTIVE SLEEP APNEA 06/23/2008    Severe OSA per sleep study 2010, Rx a CPAP  . HYPERTENSION 10/06/2006  . DEGENERATIVE JOINT DISEASE 10/06/2006  . Pain in joint, multiple sites 11/10/2006  . INSOMNIA 09/26/2008  . CHEST PAIN 11/18/2007   Past Surgical History  Procedure Date  . Right knee replacement 2005  . Left knee replacement 07/2007    Review of Systems Still has CP, right sided, radiates to R side of chest , w/w-o exertion, w/w-o food  occ R ank;le swelling    Objective:   Physical Exam A,Oxe Lungs CTA B CV RRR DIABETIC FEET EXAM: No lower extremity edema Normal pedal pulses bilaterally Skin and nails are normal without calluses Pinprick examination of the feet normal.        Assessment & Plan:

## 2010-10-22 NOTE — Assessment & Plan Note (Signed)
Continue w/ CP, on going issue, chart reviewed, heart and GI w/u neg rec observation, reminded to call if sx are different (SS CP, diaphoresis)

## 2010-10-22 NOTE — Assessment & Plan Note (Signed)
BP well controlled.

## 2010-10-22 NOTE — Patient Instructions (Signed)
See Dr Everardo All regularly for your diabetes care See Dr Laury Axon for your female exam 06-2011

## 2010-10-22 NOTE — Assessment & Plan Note (Signed)
Per endocrinology Feet exam neg today

## 2010-10-30 ENCOUNTER — Telehealth: Payer: Self-pay

## 2010-10-30 NOTE — Telephone Encounter (Signed)
I don't believe so, could you check?

## 2010-10-30 NOTE — Telephone Encounter (Addendum)
Pt called to check on status of paperwork for [facility] homecare says it was left last week but hasn't heard anything from them  Do you have paperwork?

## 2010-10-31 NOTE — Telephone Encounter (Signed)
I'll need all your paperwork to look through.

## 2010-11-06 NOTE — Telephone Encounter (Signed)
Candi Leash from [facility] called today to check on status of homecare paperwork--please call her at [phone]---Cindy said it was dropped off about 6/5

## 2010-11-12 ENCOUNTER — Other Ambulatory Visit: Payer: Self-pay | Admitting: Internal Medicine

## 2010-11-12 MED ORDER — METFORMIN HCL 1000 MG PO TABS: 1000.0000 mg | ORAL_TABLET | Freq: Two times a day (BID) | ORAL | Status: DC

## 2010-11-12 MED ORDER — BENAZEPRIL-HYDROCHLOROTHIAZIDE 20-12.5 MG PO TABS: 3.0000 | ORAL_TABLET | ORAL | Status: DC

## 2010-11-12 MED ORDER — CARVEDILOL 25 MG PO TABS: 25.0000 mg | ORAL_TABLET | Freq: Two times a day (BID) | ORAL | Status: DC

## 2010-11-12 MED ORDER — DILTIAZEM HCL ER BEADS 360 MG PO CP24: 360.0000 mg | ORAL_CAPSULE | Freq: Every day | ORAL | Status: DC

## 2010-11-12 MED ORDER — ROSUVASTATIN CALCIUM 20 MG PO TABS: ORAL_TABLET | ORAL | Status: DC

## 2010-11-12 NOTE — Telephone Encounter (Signed)
Sent in

## 2010-11-14 NOTE — Telephone Encounter (Signed)
Spoke w/ cindy we received paperwork from them and it needed to sent to [facility], will fax.

## 2010-11-14 NOTE — Telephone Encounter (Signed)
[facility] is going to fax another copy of paperwork.

## 2010-12-05 ENCOUNTER — Other Ambulatory Visit: Payer: Self-pay | Admitting: Endocrinology

## 2010-12-11 ENCOUNTER — Telehealth: Payer: Self-pay | Admitting: *Deleted

## 2010-12-11 NOTE — Telephone Encounter (Signed)
Pt called and left a VM stating that she needed someone to call her back. Tried to call pt back x 2, no VM, no answer.

## 2010-12-12 NOTE — Telephone Encounter (Signed)
Pt needs forms for home health-- pt states that Dr.Paz faxed in original order it was missing info. They will be faxing over new forms and letting us know what is missing.

## 2010-12-13 NOTE — Telephone Encounter (Signed)
noted 

## 2010-12-19 ENCOUNTER — Ambulatory Visit (INDEPENDENT_AMBULATORY_CARE_PROVIDER_SITE_OTHER): Payer: Medicare Other | Admitting: Pulmonary Disease

## 2010-12-19 ENCOUNTER — Encounter: Payer: Self-pay | Admitting: Pulmonary Disease

## 2010-12-19 ENCOUNTER — Ambulatory Visit: Payer: Medicare Other | Admitting: Pulmonary Disease

## 2010-12-19 VITALS — BP 126/70 | HR 78 | Temp 98.0°F | Ht 68.0 in | Wt 309.4 lb

## 2010-12-19 DIAGNOSIS — G4733 Obstructive sleep apnea (adult) (pediatric): Secondary | ICD-10-CM

## 2010-12-19 NOTE — Patient Instructions (Signed)
Will get you restarted back on oxygen at night with a humidity bottle attached. You must avoid naps during the day if you want to sleep at night. Remember that oxygen does not treat your sleep apnea.  It just prevents you from developing heart/circulation problems associated with low blood oxygen.  You would either have to wear cpap, or lose significant weight for your sleep apnea to be treated.   followup with me in one year.

## 2010-12-19 NOTE — Progress Notes (Signed)
  Subjective:    Patient ID: [name], female    DOB: 10-27-1950, 60 y.o.   MRN: [id number]  HPI The pt comes in today for f/u of her known severe osa.  She is intolerant to cpap/bipap, and has been started on oxygen to minimize her nocturnal hypoxemia.   She has had her oxygen taken away by her dme company until she followed up with me.  She continues to sleep poorly due to her osa, but also due to her napping during the day.  Her weight is unchanged since her last visit.    Review of Systems  Constitutional: Positive for fever. Negative for unexpected weight change.  HENT: Positive for nosebleeds and congestion. Negative for ear pain, sore throat, rhinorrhea, sneezing, trouble swallowing, dental problem, postnasal drip and sinus pressure.   Eyes: Positive for redness and itching.  Respiratory: Positive for shortness of breath. Negative for cough, chest tightness and wheezing.   Cardiovascular: Positive for palpitations and leg swelling.  Gastrointestinal: Negative for nausea and vomiting.  Genitourinary: Negative for dysuria.  Musculoskeletal: Negative for joint swelling.  Skin: Negative for rash.  Neurological: Negative for headaches.  Hematological: Bruises/bleeds easily.  Psychiatric/Behavioral: Positive for dysphoric mood. The patient is nervous/anxious.        Objective:   Physical Exam Morbidly obese female in nad No purulence or discharge from nares Chest clear to auscultation Cor with rrr LE with minimal ankle edema, no cyanosis Appears sleepy, moves all 4        Assessment & Plan:

## 2010-12-22 NOTE — Assessment & Plan Note (Signed)
The patient comes in today for followup of her sleep apnea, which is being treated with nocturnal oxygen due to her intolerance to positive pressure devices.  She was maintaining on her oxygen compliantly, until her dme took her concentrator away.  I've asked her to continue on this while she is trying to work on weight loss, and will send an order to her dme to restart.

## 2010-12-30 ENCOUNTER — Encounter: Payer: Self-pay | Admitting: Endocrinology

## 2010-12-30 ENCOUNTER — Other Ambulatory Visit (INDEPENDENT_AMBULATORY_CARE_PROVIDER_SITE_OTHER): Payer: Medicare Other

## 2010-12-30 ENCOUNTER — Ambulatory Visit (INDEPENDENT_AMBULATORY_CARE_PROVIDER_SITE_OTHER): Payer: Medicare Other | Admitting: Endocrinology

## 2010-12-30 VITALS — BP 102/64 | HR 86 | Temp 98.0°F | Ht 66.0 in | Wt 306.8 lb

## 2010-12-30 DIAGNOSIS — E119 Type 2 diabetes mellitus without complications: Secondary | ICD-10-CM

## 2010-12-30 NOTE — Patient Instructions (Addendum)
Please make a follow-up appointment in 3 months blood tests are being ordered for you today.  please call [phone] to hear your test results.  You will be prompted to enter the 9-digit "MRN" number that appears at the top left of this page, followed by #.  Then you will hear the message. pending the test results, please continue the insulin, 36 units 2x a day (with the first and last meals of the day). On this type of insulin schedule, meals should be eaten on a regular schedule.   (update: i left message on phone-tree:  Increase insulin to 38 units bid)

## 2010-12-30 NOTE — Progress Notes (Signed)
Subjective:    Patient ID: [name], female    DOB: 1951-04-27, 60 y.o.   MRN: [id number]  HPI she brings a record of her cbg's which i have reviewed today. It varies from 100-250.  There is no trend throughout the day.  It was 65 once, before lunch, when she ate a smaller-than-expected breakfast. Past Medical History  Diagnosis Date  . DIABETES MELLITUS 10/06/2006  . HYPERLIPIDEMIA 01/11/2009  . UNSPECIFIED ANEMIA 12/10/2009  . DEPRESSION 09/26/2008  . OBSTRUCTIVE SLEEP APNEA 06/23/2008    Severe OSA per sleep study 2010, Rx a CPAP  . HYPERTENSION 10/06/2006  . DEGENERATIVE JOINT DISEASE 10/06/2006  . Pain in joint, multiple sites 11/10/2006  . INSOMNIA 09/26/2008  . CHEST PAIN 11/18/2007    Past Surgical History  Procedure Date  . Right knee replacement 2005  . Left knee replacement 07/2007    History   Social History  . Marital Status: Widowed    Spouse Name: N/A    Number of Children: N/A  . Years of Education: N/A   Occupational History  . Not on file.   Social History Main Topics  . Smoking status: Former Smoker -- 0.2 packs/day for 4 years    Types: Cigarettes    Quit date: 05/13/1995  . Smokeless tobacco: Never Used  . Alcohol Use: Yes     Rarely  . Drug Use: No  . Sexually Active: Not on file   Other Topics Concern  . Not on file   Social History Narrative  . No narrative on file    Current Outpatient Prescriptions on File Prior to Visit  Medication Sig Dispense Refill  . aspirin 325 MG tablet Take 325 mg by mouth daily.        . benazepril-hydrochlorthiazide (LOTENSIN HCT) 20-12.5 MG per tablet Take 3 tablets by mouth every morning.  270 tablet  1  . carvedilol (COREG) 25 MG tablet Take 1 tablet (25 mg total) by mouth 2 (two) times daily.  180 tablet  1  . diltiazem (TAZTIA XT) 360 MG 24 hr capsule Take 1 capsule (360 mg total) by mouth daily.  90 capsule  1  . Glucose Blood (PRODIGY BLOOD GLUCOSE TEST VI) by In Vitro route 2 (two) times daily.          . insulin lispro protamine-insulin lispro (HUMALOG 50/50) (50-50) 100 UNIT/ML SUSP Inject 36 Units into the skin 2 (two) times daily before a meal.       . Insulin Pen Needle 29G X MISC Use as directed three times daily  100 each  3  . metFORMIN (GLUCOPHAGE) 1000 MG tablet Take 1 tablet (1,000 mg total) by mouth 2 (two) times daily with a meal.  180 tablet  1  . pantoprazole (PROTONIX) 40 MG tablet Take 40 mg by mouth daily as needed.       . potassium chloride (KLOR-CON) 10 MEQ CR tablet Take 1 tablet (10 mEq total) by mouth daily.  90 tablet  0  . PRODIGY TWIST TOP LANCETS 28G MISC TEST three times a day  100 each  2  . rosuvastatin (CRESTOR) 20 MG tablet 1 1/2 tab by mouth daily.      Marland Kitchen telmisartan (MICARDIS) 40 MG tablet Take 1 tablet (40 mg total) by mouth daily.  90 tablet  1    No Known Allergies  Family History  Problem Relation Age of Onset  . Asthma Mother   . Diabetes Mother   .  Stroke Mother   . Diabetes Sister   . Hypertension Sister   . Diabetes Brother   . Hypertension Brother   . Pancreatic cancer Brother     BP 102/64  Pulse 86  Temp(Src) 98 F (36.7 C) (Oral)  Ht 5\' 6"  (1.676 m)  Wt 306 lb 12.8 oz (139.164 kg)  BMI 49.52 kg/m2  SpO2 99%  Review of Systems denies loc.    Objective:   Physical Exam GENERAL: no distress.  obese There is no palpable thyroid enlargement.  No thyroid nodule.  No palpable lymphadenopathy at the anterior neck. Ext: (sees podiatry). Lab Results  Component Value Date   HGBA1C 8.0* 12/30/2010      Assessment & Plan:  Dm, needs increased rx

## 2011-01-09 ENCOUNTER — Other Ambulatory Visit: Payer: Self-pay | Admitting: Pulmonary Disease

## 2011-01-09 NOTE — Progress Notes (Signed)
Please let [facility] know that her low sat ra was 73% Also, let them know that when I order ONO, I want an ONO, not a spirometry!!!!!! Please see comments on report I left in box.

## 2011-01-10 NOTE — Progress Notes (Signed)
Called and spoke with Jasmine December at [facility] and advised her that the ONO qualified patient for oxygen at night. I also explained to Jasmine December that when we order an overnight oximetry that is all that we want. If we want a full oximetry, that we will order that. This patient did not need anything but an overnight oximetry to re-qualify her for o2 at night.  Jasmine December stated that she would make a note for future reference when order was received from this office.

## 2011-01-16 ENCOUNTER — Telehealth: Payer: Self-pay | Admitting: Pulmonary Disease

## 2011-01-16 NOTE — Telephone Encounter (Signed)
Pt says ONO was done last week and returned to [facility] via [employer]. I will forward msg to [facility] to see if results have been received for [facility] to review. Pls advise.

## 2011-01-17 NOTE — Telephone Encounter (Signed)
ATC [facility] for ONO results at [phone].  The office is closed for the day.  WCB next week.

## 2011-01-20 NOTE — Telephone Encounter (Signed)
This has already been taking care of.  See orders only on 01/09/11.  Was sent to [facility].  Verify with them they received.

## 2011-01-20 NOTE — Telephone Encounter (Signed)
I spoke with [facility] and they state Dr. Shelle Iron did receive the ONO b/c it was faxed back with orders written all over the paper. [facility] states they will re fax the ONO to triage fax

## 2011-01-20 NOTE — Telephone Encounter (Signed)
Received ONO results and put in KC's very important look at folder for him to review.

## 2011-01-20 NOTE — Telephone Encounter (Signed)
Per Peterson Lombard is working on this. Will forward to [facility] to see what the statis of pt getting oxygen is. Please advise Libby. Thanks  Carver Fila, CMA

## 2011-01-21 ENCOUNTER — Other Ambulatory Visit: Payer: Self-pay | Admitting: Pulmonary Disease

## 2011-01-21 DIAGNOSIS — G4733 Obstructive sleep apnea (adult) (pediatric): Secondary | ICD-10-CM

## 2011-01-21 NOTE — Telephone Encounter (Signed)
Order for o2 was not sent to [facility]. Please send to [facility] or advise what flow o2 is needed and I can send it, thanks

## 2011-01-21 NOTE — Telephone Encounter (Signed)
I NEVER GOT AN "ORDER" TO START 02 I NEED ONE ASAP

## 2011-01-23 ENCOUNTER — Other Ambulatory Visit: Payer: Self-pay | Admitting: Endocrinology

## 2011-01-23 NOTE — Telephone Encounter (Signed)
Order sent. [name], CMA  

## 2011-02-03 LAB — DIFFERENTIAL
Basophils Relative: 0
Eosinophils Relative: 1
Lymphocytes Relative: 33
Lymphs Abs: 1.6
Monocytes Relative: 7
Neutro Abs: 2.8

## 2011-02-03 LAB — URINALYSIS, ROUTINE W REFLEX MICROSCOPIC
Nitrite: NEGATIVE
Specific Gravity, Urine: 1.016
Urobilinogen, UA: 0.2

## 2011-02-03 LAB — CBC
HCT: 38.5
HCT: 32.5 — ABNORMAL LOW
Hemoglobin: 13.2
Hemoglobin: 11.2 — ABNORMAL LOW
MCHC: 34.5
MCV: 86.2
MCV: 86.3
MCV: 86.3
MCV: 85.9
Platelets: 266
RBC: 3.82 — ABNORMAL LOW
RBC: 3.79 — ABNORMAL LOW
RDW: 14.4
WBC: 4.7
WBC: 6.6
WBC: 8.2
WBC: 5.6

## 2011-02-03 LAB — TYPE AND SCREEN: Antibody Screen: NEGATIVE

## 2011-02-03 LAB — BASIC METABOLIC PANEL
BUN: 11
CO2: 29
Calcium: 8.9
Chloride: 102
Chloride: 104
Chloride: 98
Chloride: 99
Creatinine, Ser: 0.69
Creatinine, Ser: 0.86
GFR calc Af Amer: >60
GFR calc Af Amer: >60
GFR calc non Af Amer: >60
GFR calc non Af Amer: >60
GFR calc non Af Amer: >60
Potassium: 3.6
Potassium: 4.1
Sodium: 141
Sodium: 137

## 2011-02-03 LAB — HEMOGLOBIN A1C
Hgb A1c MFr Bld: 6.8 — ABNORMAL HIGH
Mean Plasma Glucose: 165

## 2011-02-03 LAB — URINE MICROSCOPIC-ADD ON

## 2011-02-07 LAB — POCT I-STAT, CHEM 8
BUN: 13
Calcium, Ion: 1.03 — ABNORMAL LOW
Chloride: 105
Glucose, Bld: 117 — ABNORMAL HIGH
HCT: 37
Potassium: 3.6

## 2011-02-07 LAB — CK TOTAL AND CKMB (NOT AT ARMC)
CK, MB: 1.1
Relative Index: 0.9
Total CK: 116

## 2011-02-07 LAB — B-NATRIURETIC PEPTIDE (CONVERTED LAB): Pro B Natriuretic peptide (BNP): <30

## 2011-02-07 LAB — D-DIMER, QUANTITATIVE: D-Dimer, Quant: 1.2 — ABNORMAL HIGH

## 2011-02-07 LAB — TROPONIN I: Troponin I: 0.01

## 2011-02-07 LAB — COMPREHENSIVE METABOLIC PANEL
ALT: 23
AST: 22
AST: 23
Albumin: 3.6
Albumin: 3.7
Alkaline Phosphatase: 78
Alkaline Phosphatase: 82
Alkaline Phosphatase: 84
BUN: 9
CO2: 28
CO2: 29
Calcium: 9.3
Chloride: 102
Chloride: 105
Creatinine, Ser: 0.78
Creatinine, Ser: 0.81
GFR calc Af Amer: >60
GFR calc Af Amer: >60
GFR calc non Af Amer: 58 — ABNORMAL LOW
GFR calc non Af Amer: >60
Glucose, Bld: 133 — ABNORMAL HIGH
Glucose, Bld: 174 — ABNORMAL HIGH
Potassium: 3.5
Potassium: 3.5
Potassium: 3.6
Sodium: 140
Total Bilirubin: 0.7
Total Protein: 7.5

## 2011-02-07 LAB — DIFFERENTIAL
Basophils Relative: 1
Eosinophils Absolute: 0.1
Lymphs Abs: 1.8
Monocytes Absolute: 0.4
Monocytes Relative: 8

## 2011-02-07 LAB — CARDIAC PANEL(CRET KIN+CKTOT+MB+TROPI)
CK, MB: 1.3
Total CK: 115
Total CK: 115
Troponin I: 0.01

## 2011-02-07 LAB — CBC
Hemoglobin: 12.2
MCHC: 33.2
MCV: 88
RBC: 4.18
WBC: 5.6

## 2011-02-07 LAB — POCT CARDIAC MARKERS: Troponin i, poc: <0.05

## 2011-02-10 LAB — GLUCOSE, CAPILLARY: Glucose-Capillary: 164 — ABNORMAL HIGH

## 2011-02-14 ENCOUNTER — Telehealth: Payer: Self-pay | Admitting: *Deleted

## 2011-02-14 MED ORDER — POTASSIUM CHLORIDE 10 MEQ PO TBCR: 10.0000 meq | EXTENDED_RELEASE_TABLET | Freq: Every day | ORAL | Status: DC

## 2011-02-14 MED ORDER — TELMISARTAN 40 MG PO TABS: 40.0000 mg | ORAL_TABLET | Freq: Every day | ORAL | Status: DC

## 2011-02-14 NOTE — Telephone Encounter (Signed)
Request for Micardis 40 mg tab Rx Done.

## 2011-02-14 NOTE — Telephone Encounter (Signed)
Refill request for Potassium Chloride tab 10 meq ER/750 mg Rx Done.

## 2011-02-25 ENCOUNTER — Other Ambulatory Visit: Payer: Self-pay | Admitting: Family Medicine

## 2011-02-27 LAB — CBC
Hemoglobin: 14
MCHC: 33.3
RBC: 4.83
RDW: 14.3 — ABNORMAL HIGH

## 2011-02-27 LAB — DIFFERENTIAL
Basophils Absolute: 0
Basophils Relative: 0
Lymphocytes Relative: 23
Monocytes Absolute: 0.3
Neutro Abs: 4.2
Neutrophils Relative %: 71

## 2011-02-27 LAB — I-STAT 8, (EC8 V) (CONVERTED LAB)
Acid-Base Excess: 5 — ABNORMAL HIGH
BUN: 11
Chloride: 104
HCT: 46
Hemoglobin: 15.6 — ABNORMAL HIGH
Operator id: 291361
Potassium: 3.6
Sodium: 137
pCO2, Ven: 40.5 — ABNORMAL LOW

## 2011-02-27 LAB — URINALYSIS, ROUTINE W REFLEX MICROSCOPIC
Bilirubin Urine: NEGATIVE
Glucose, UA: >1000 — AB
Ketones, ur: NEGATIVE
Leukocytes, UA: NEGATIVE
Nitrite: NEGATIVE
Specific Gravity, Urine: 1.023
pH: 5.5

## 2011-02-27 LAB — HEPATIC FUNCTION PANEL
ALT: 19
AST: 19
Albumin: 4
Total Protein: 8.5 — ABNORMAL HIGH

## 2011-02-27 LAB — POCT I-STAT CREATININE: Creatinine, Ser: 0.9

## 2011-02-27 LAB — URINE MICROSCOPIC-ADD ON

## 2011-03-21 ENCOUNTER — Encounter: Payer: Self-pay | Admitting: Endocrinology

## 2011-03-21 ENCOUNTER — Other Ambulatory Visit (INDEPENDENT_AMBULATORY_CARE_PROVIDER_SITE_OTHER): Payer: Medicare Other

## 2011-03-21 ENCOUNTER — Ambulatory Visit (INDEPENDENT_AMBULATORY_CARE_PROVIDER_SITE_OTHER): Payer: Medicare Other | Admitting: Endocrinology

## 2011-03-21 VITALS — BP 104/68 | HR 84 | Temp 98.0°F | Ht 66.0 in | Wt 312.4 lb

## 2011-03-21 DIAGNOSIS — E119 Type 2 diabetes mellitus without complications: Secondary | ICD-10-CM

## 2011-03-21 LAB — HEMOGLOBIN A1C: Hgb A1c MFr Bld: 7.5 % — ABNORMAL HIGH (ref 4.6–6.5)

## 2011-03-21 NOTE — Progress Notes (Signed)
Subjective:    Patient ID: [name], female    DOB: 05/12/1951, 60 y.o.   MRN: [id number]  HPI Pt returns for f/u of type 2 dm (2002).  no cbg record, but states cbg's are "up and down."  It is in general higher as the day goes on pt states he feels well in general. Past Medical History  Diagnosis Date  . DIABETES MELLITUS 10/06/2006  . HYPERLIPIDEMIA 01/11/2009  . UNSPECIFIED ANEMIA 12/10/2009  . DEPRESSION 09/26/2008  . OBSTRUCTIVE SLEEP APNEA 06/23/2008    Severe OSA per sleep study 2010, Rx a CPAP  . HYPERTENSION 10/06/2006  . DEGENERATIVE JOINT DISEASE 10/06/2006  . Pain in joint, multiple sites 11/10/2006  . INSOMNIA 09/26/2008  . CHEST PAIN 11/18/2007    Past Surgical History  Procedure Date  . Right knee replacement 2005  . Left knee replacement 07/2007    History   Social History  . Marital Status: Widowed    Spouse Name: N/A    Number of Children: N/A  . Years of Education: N/A   Occupational History  . Not on file.   Social History Main Topics  . Smoking status: Former Smoker -- 0.2 packs/day for 4 years    Types: Cigarettes    Quit date: 05/13/1995  . Smokeless tobacco: Never Used  . Alcohol Use: Yes     Rarely  . Drug Use: No  . Sexually Active: Not on file   Other Topics Concern  . Not on file   Social History Narrative  . No narrative on file    Current Outpatient Prescriptions on File Prior to Visit  Medication Sig Dispense Refill  . aspirin 325 MG tablet Take 325 mg by mouth daily.        . benazepril-hydrochlorthiazide (LOTENSIN HCT) 20-12.5 MG per tablet Take 3 tablets by mouth every morning.  270 tablet  1  . carvedilol (COREG) 25 MG tablet Take 1 tablet (25 mg total) by mouth 2 (two) times daily.  180 tablet  1  . CRESTOR 20 MG tablet take 1 and 1/2 tablets by mouth once daily  45 tablet  0  . diltiazem (TAZTIA XT) 360 MG 24 hr capsule Take 1 capsule (360 mg total) by mouth daily.  90 capsule  1  . Glucose Blood (PRODIGY BLOOD GLUCOSE  TEST VI) by In Vitro route 2 (two) times daily.        . insulin lispro protamine-insulin lispro (HUMALOG 50/50) (50-50) 100 UNIT/ML SUSP Inject 38 Units into the skin 2 (two) times daily before a meal.       . Insulin Pen Needle 29G X MISC Use as directed three times daily  100 each  3  . metFORMIN (GLUCOPHAGE) 1000 MG tablet Take 1 tablet (1,000 mg total) by mouth 2 (two) times daily with a meal.  180 tablet  1  . pantoprazole (PROTONIX) 40 MG tablet Take 40 mg by mouth daily as needed.       . potassium chloride (KLOR-CON) 10 MEQ CR tablet Take 1 tablet (10 mEq total) by mouth daily.  90 tablet  0  . PRODIGY NO CODING BLOOD GLUC test strip TEST twice a day  100 each  3  . PRODIGY TWIST TOP LANCETS 28G MISC TEST three times a day  100 each  2  . telmisartan (MICARDIS) 40 MG tablet Take 1 tablet (40 mg total) by mouth daily.  90 tablet  1    No Known Allergies  Family History  Problem Relation Age of Onset  . Asthma Mother   . Diabetes Mother   . Stroke Mother   . Diabetes Sister   . Hypertension Sister   . Diabetes Brother   . Hypertension Brother   . Pancreatic cancer Brother     BP 104/68  Pulse 84  Temp(Src) 98 F (36.7 C) (Oral)  Ht 5\' 6"  (1.676 m)  Wt 312 lb 6 oz (141.692 kg)  BMI 50.42 kg/m2  SpO2 97%   Review of Systems denies hypoglycemia    Objective:   Physical Exam VITAL SIGNS:  See vs page GENERAL: no distress.  Obese Pulses: dorsalis pedis intact bilat.   Feet: no deformity.  There is a bony prominence at the dorsal aspect of the right foot.  no ulcer on the feet.  feet are of normal color and temp.  no edema Neuro: sensation is intact to touch on the feet.    Lab Results  Component Value Date   HGBA1C 7.5* 03/21/2011    .   Assessment & Plan:  DM, this is the best control this pt should aim for, given this regimen, which does match insulin to her changing needs throughout the day

## 2011-03-21 NOTE — Patient Instructions (Addendum)
Please make a follow-up appointment in 3 months blood tests are being ordered for you today.  please call [phone] to hear your test results.  You will be prompted to enter the 9-digit "MRN" number that appears at the top left of this page, followed by #.  Then you will hear the message. pending the test results, please continue the insulin, 38 units 2x a day (with the first and last meals of the day). On this type of insulin schedule, meals should be eaten on a regular schedule.   Please obtain a copy of the report of the x-ray of your right foot.   (update: i left message on phone-tree:  rx as we discussed)

## 2011-04-21 ENCOUNTER — Other Ambulatory Visit: Payer: Self-pay | Admitting: Family Medicine

## 2011-04-24 ENCOUNTER — Other Ambulatory Visit: Payer: Self-pay | Admitting: *Deleted

## 2011-04-24 MED ORDER — METFORMIN HCL 1000 MG PO TABS: 1000.0000 mg | ORAL_TABLET | Freq: Two times a day (BID) | ORAL | Status: DC

## 2011-04-24 MED ORDER — CARVEDILOL 25 MG PO TABS: 25.0000 mg | ORAL_TABLET | Freq: Two times a day (BID) | ORAL | Status: DC

## 2011-04-24 MED ORDER — POTASSIUM CHLORIDE 10 MEQ PO TBCR: 10.0000 meq | EXTENDED_RELEASE_TABLET | Freq: Every day | ORAL | Status: DC

## 2011-04-24 NOTE — Telephone Encounter (Signed)
Rx sent to pharmacy   

## 2011-04-28 ENCOUNTER — Other Ambulatory Visit: Payer: Self-pay | Admitting: Internal Medicine

## 2011-04-29 MED ORDER — CLONIDINE HCL 0.2 MG PO TABS: 0.2000 mg | ORAL_TABLET | Freq: Two times a day (BID) | ORAL | Status: DC

## 2011-04-29 NOTE — Telephone Encounter (Signed)
Rx sent 

## 2011-05-01 ENCOUNTER — Telehealth: Payer: Self-pay | Admitting: *Deleted

## 2011-05-01 NOTE — Telephone Encounter (Signed)
Note from pharmacy indicated that they do not stock requested medication. The prescription could be changed to an alternative medication. Klor-con 10 meq.Please advise

## 2011-05-01 NOTE — Telephone Encounter (Signed)
Okay, please discuss issue with the pharmacist , ok  to change to an equivalent potassium tablet

## 2011-05-02 MED ORDER — POTASSIUM CHLORIDE 10 MEQ PO TBCR: 10.0000 meq | EXTENDED_RELEASE_TABLET | Freq: Every day | ORAL | Status: DC

## 2011-05-02 NOTE — Telephone Encounter (Signed)
Rx faxed in for 3 month supply with 1 refill.

## 2011-05-03 ENCOUNTER — Other Ambulatory Visit: Payer: Self-pay | Admitting: Endocrinology

## 2011-05-28 ENCOUNTER — Ambulatory Visit: Payer: Medicare Other | Admitting: Internal Medicine

## 2011-05-31 ENCOUNTER — Other Ambulatory Visit: Payer: Self-pay | Admitting: Family Medicine

## 2011-06-02 NOTE — Telephone Encounter (Signed)
Lipid/Hep 272.4/995.20  

## 2011-06-06 ENCOUNTER — Ambulatory Visit (INDEPENDENT_AMBULATORY_CARE_PROVIDER_SITE_OTHER): Payer: Medicare Other | Admitting: Internal Medicine

## 2011-06-06 VITALS — BP 148/86 | HR 80 | Temp 98.0°F | Wt 311.0 lb

## 2011-06-06 DIAGNOSIS — Z1239 Encounter for other screening for malignant neoplasm of breast: Secondary | ICD-10-CM | POA: Insufficient documentation

## 2011-06-06 DIAGNOSIS — E785 Hyperlipidemia, unspecified: Secondary | ICD-10-CM

## 2011-06-06 DIAGNOSIS — D649 Anemia, unspecified: Secondary | ICD-10-CM

## 2011-06-06 DIAGNOSIS — I10 Essential (primary) hypertension: Secondary | ICD-10-CM

## 2011-06-06 DIAGNOSIS — Z0000 Encounter for general adult medical examination without abnormal findings: Secondary | ICD-10-CM | POA: Insufficient documentation

## 2011-06-06 LAB — COMPREHENSIVE METABOLIC PANEL
ALT: 13 U/L (ref 0–35)
AST: 15 U/L (ref 0–37)
Albumin: 4.1 g/dL (ref 3.5–5.2)
Alkaline Phosphatase: 83 U/L (ref 39–117)
BUN: 18 mg/dL (ref 6–23)
Creat: 0.95 mg/dL (ref 0.50–1.10)
Potassium: 4 mEq/L (ref 3.5–5.3)

## 2011-06-06 MED ORDER — ROSUVASTATIN CALCIUM 40 MG PO TABS: 40.0000 mg | ORAL_TABLET | Freq: Every day | ORAL | Status: DC

## 2011-06-06 NOTE — Assessment & Plan Note (Addendum)
Checking LFTs today Was recommended crestor 20 mg 1.5 tab a day but unable to break it in half so she was taking 1 bid----> will change to 40 mg qd

## 2011-06-06 NOTE — Patient Instructions (Signed)
Check the  blood pressure 2 or 3 times a week, be sure it is less than 140/85. If it is consistently higher, let me know Also keep a list of your BPs and bring it with you next time Next visit in 3 months FASTING

## 2011-06-06 NOTE — Assessment & Plan Note (Signed)
Labs

## 2011-06-06 NOTE — Progress Notes (Signed)
  Subjective:    Patient ID: [name], female    DOB: August 05, 1950, 61 y.o.   MRN: [id number]  HPI Routine office visit Feeling well, no concerns  Past Medical History: DIABETES MELLITUS   HYPERTENSION  Severe OSA per sleep study 2010, Rx a CPAP DJD, pain in multiple joints CHEST PAIN: 2004--Cath neg, arterial LE dopplers neg 06-2003-- abd. u/s fatty liver 01-2005-- CP...neg u/s GB, neg HIDA 10-06--saw cards--cardiolite neg CP--abnormal stress test, Cath 9-09: normal 2006 Cscope --TICs Depression (Dx 09-2008)  Past Surgical History:  2005  -R- Knne replacecement 3-09 TKR left  Social History: Widow, 4 kids tobacco-- no ETOH-- rarely not working, on disability exercising a little more    Review of Systems Medication list was reviewed, she was supposed to take Crestor 30 mg but couldn't break the tablets so she is taking 40 mg daily. Ambulatory blood sugars varies from the 200s to 113. She does not recall her ambulatory BPs, slightly high?     Objective:   Physical Exam  Constitutional: She is oriented to person, place, and time. She appears well-developed. No distress.  HENT:  Head: Normocephalic and atraumatic.  Cardiovascular: Normal rate, regular rhythm and normal heart sounds.   No murmur heard. Pulmonary/Chest: Effort normal and breath sounds normal. No respiratory distress. She has no wheezes. She has no rales.  Musculoskeletal: She exhibits no edema.  Neurological: She is alert and oriented to person, place, and time.  Skin: She is not diaphoretic.  Psychiatric: She has a normal mood and affect. Her behavior is normal.       Assessment & Plan:

## 2011-06-06 NOTE — Assessment & Plan Note (Signed)
She has an appointment next month to see Dr.Lowne  for a female exam

## 2011-06-06 NOTE — Assessment & Plan Note (Signed)
Unable to tell me how is her BP at home , see instruction, no change

## 2011-06-07 ENCOUNTER — Encounter: Payer: Self-pay | Admitting: Internal Medicine

## 2011-06-07 LAB — CBC WITH DIFFERENTIAL/PLATELET
Basophils Absolute: 0 10*3/uL (ref 0.0–0.1)
Basophils Relative: 0 % (ref 0–1)
HCT: 34.7 % — ABNORMAL LOW (ref 36.0–46.0)
MCHC: 34.6 g/dL (ref 30.0–36.0)
Monocytes Absolute: 0.4 10*3/uL (ref 0.1–1.0)
Neutro Abs: 2.8 10*3/uL (ref 1.7–7.7)
Platelets: 267 10*3/uL (ref 150–400)
RDW: 13.6 % (ref 11.5–15.5)
WBC: 5.3 10*3/uL (ref 4.0–10.5)

## 2011-06-11 ENCOUNTER — Encounter: Payer: Self-pay | Admitting: *Deleted

## 2011-06-20 ENCOUNTER — Encounter: Payer: Self-pay | Admitting: Endocrinology

## 2011-06-20 ENCOUNTER — Ambulatory Visit (INDEPENDENT_AMBULATORY_CARE_PROVIDER_SITE_OTHER): Payer: Medicare Other | Admitting: Endocrinology

## 2011-06-20 ENCOUNTER — Other Ambulatory Visit (INDEPENDENT_AMBULATORY_CARE_PROVIDER_SITE_OTHER): Payer: Medicare Other

## 2011-06-20 VITALS — BP 110/72 | HR 84 | Temp 97.4°F | Ht 69.0 in | Wt 315.0 lb

## 2011-06-20 DIAGNOSIS — E119 Type 2 diabetes mellitus without complications: Secondary | ICD-10-CM

## 2011-06-20 LAB — HEMOGLOBIN A1C: Hgb A1c MFr Bld: 8.7 % — ABNORMAL HIGH (ref 4.6–6.5)

## 2011-06-20 MED ORDER — ACCU-CHEK AVIVA PLUS W/DEVICE KIT: 1.0000 | PACK | Freq: Once | Status: DC

## 2011-06-20 MED ORDER — GLUCOSE BLOOD VI STRP: 1.0000 | ORAL_STRIP | Freq: Two times a day (BID) | Status: DC

## 2011-06-20 NOTE — Patient Instructions (Addendum)
Please make a follow-up appointment in 3 months blood tests are being ordered for you today.  please call [phone] to hear your test results.  You will be prompted to enter the 9-digit "MRN" number that appears at the top left of this page, followed by #.  Then you will hear the message. pending the test results, please continue the insulin, 38 units 2x a day (with the first and last meals of the day). On this type of insulin schedule, meals should be eaten on a regular schedule.   check your blood sugar 2 times a day.  vary the time of day when you check, between before the 3 meals, and at bedtime.  also check if you have symptoms of your blood sugar being too high or too low.  please keep a record of the readings and bring it to your next appointment here.  please call us sooner if your blood sugar goes below 70, or if it stays over 200.  (update: i left message on phone-tree:  Increase insulin to 45-bid)

## 2011-06-20 NOTE — Progress Notes (Signed)
Subjective:    Patient ID: [name], female    DOB: 1950/06/19, 61 y.o.   MRN: [id number]  HPI pt returns for f/u of type 2 dm (2002).  she brings a record of her cbg's which i have reviewed today.  It varies from 112-270, but most are in the 100's.  It is in general lower as the day goes on.  pt states he feels well in general. Past Medical History  Diagnosis Date  . DIABETES MELLITUS 10/06/2006  . HYPERLIPIDEMIA 01/11/2009  . UNSPECIFIED ANEMIA 12/10/2009  . DEPRESSION 09/26/2008  . OBSTRUCTIVE SLEEP APNEA 06/23/2008    Severe OSA per sleep study 2010, Rx a CPAP  . HYPERTENSION 10/06/2006  . DEGENERATIVE JOINT DISEASE 10/06/2006  . Pain in joint, multiple sites 11/10/2006  . INSOMNIA 09/26/2008  . CHEST PAIN 11/18/2007    Past Surgical History  Procedure Date  . Right knee replacement 2005  . Left knee replacement 07/2007    History   Social History  . Marital Status: Widowed    Spouse Name: N/A    Number of Children: N/A  . Years of Education: N/A   Occupational History  . Not on file.   Social History Main Topics  . Smoking status: Former Smoker -- 0.2 packs/day for 4 years    Types: Cigarettes    Quit date: 05/13/1995  . Smokeless tobacco: Never Used  . Alcohol Use: Yes     Rarely  . Drug Use: No  . Sexually Active: Not on file   Other Topics Concern  . Not on file   Social History Narrative  . No narrative on file    Current Outpatient Prescriptions on File Prior to Visit  Medication Sig Dispense Refill  . aspirin 325 MG tablet Take 325 mg by mouth daily.        . benazepril-hydrochlorthiazide (LOTENSIN HCT) 20-12.5 MG per tablet Take 3 tablets by mouth every morning.  270 tablet  1  . carvedilol (COREG) 25 MG tablet Take 1 tablet (25 mg total) by mouth 2 (two) times daily.  180 tablet  2  . cloNIDine (CATAPRES) 0.2 MG tablet Take 1 tablet (0.2 mg total) by mouth 2 (two) times daily.  180 tablet  0  . diltiazem (TAZTIA XT) 360 MG 24 hr capsule Take 1  capsule (360 mg total) by mouth daily.  90 capsule  1  . insulin lispro protamine-insulin lispro (HUMALOG 50/50) (50-50) 100 UNIT/ML SUSP Inject 45 Units into the skin 2 (two) times daily before a meal.       . Insulin Pen Needle 29G X MISC Use as directed three times daily  100 each  3  . metFORMIN (GLUCOPHAGE) 1000 MG tablet Take 1 tablet (1,000 mg total) by mouth 2 (two) times daily with a meal.  180 tablet  1  . pantoprazole (PROTONIX) 40 MG tablet Take 40 mg by mouth daily as needed.       . potassium chloride (KLOR-CON) 10 MEQ CR tablet Take 1 tablet (10 mEq total) by mouth daily.  90 tablet  2  . rosuvastatin (CRESTOR) 40 MG tablet Take 1 tablet (40 mg total) by mouth daily.  90 tablet  1  . telmisartan (MICARDIS) 40 MG tablet Take 1 tablet (40 mg total) by mouth daily.  90 tablet  1    No Known Allergies  Family History  Problem Relation Age of Onset  . Asthma Mother   . Diabetes Mother   .  Stroke Mother   . Diabetes Sister   . Hypertension Sister   . Diabetes Brother   . Hypertension Brother   . Pancreatic cancer Brother     BP 110/72  Pulse 84  Temp(Src) 97.4 F (36.3 C) (Oral)  Ht 5\' 9"  (1.753 m)  Wt 315 lb (142.883 kg)  BMI 46.52 kg/m2  SpO2 96%   Review of Systems denies hypoglycemia.      Objective:   Physical Exam VITAL SIGNS:  See vs page GENERAL: no distress SKIN:  Insulin injection sites at the anterior abdomen are normal.   (sees podiatry)   Lab Results  Component Value Date   HGBA1C 8.7* 06/20/2011      Assessment & Plan:  DM, needs increased rx

## 2011-06-23 ENCOUNTER — Other Ambulatory Visit: Payer: Self-pay | Admitting: Internal Medicine

## 2011-06-23 ENCOUNTER — Encounter: Payer: Self-pay | Admitting: Gastroenterology

## 2011-06-23 ENCOUNTER — Encounter: Payer: Self-pay | Admitting: Family Medicine

## 2011-06-23 ENCOUNTER — Ambulatory Visit (INDEPENDENT_AMBULATORY_CARE_PROVIDER_SITE_OTHER): Payer: Medicare Other | Admitting: Family Medicine

## 2011-06-23 ENCOUNTER — Other Ambulatory Visit (HOSPITAL_COMMUNITY)
Admission: RE | Admit: 2011-06-23 | Discharge: 2011-06-23 | Disposition: A | Discharge status: Home / Self Care | Payer: Medicare Other | Source: Ambulatory Visit | Attending: Family Medicine | Admitting: Family Medicine

## 2011-06-23 VITALS — BP 124/68 | HR 91 | Temp 98.7°F | Ht 69.0 in | Wt 310.2 lb

## 2011-06-23 DIAGNOSIS — I10 Essential (primary) hypertension: Secondary | ICD-10-CM

## 2011-06-23 DIAGNOSIS — Z1231 Encounter for screening mammogram for malignant neoplasm of breast: Secondary | ICD-10-CM

## 2011-06-23 DIAGNOSIS — R195 Other fecal abnormalities: Secondary | ICD-10-CM

## 2011-06-23 DIAGNOSIS — Z124 Encounter for screening for malignant neoplasm of cervix: Secondary | ICD-10-CM

## 2011-06-23 DIAGNOSIS — Z01419 Encounter for gynecological examination (general) (routine) without abnormal findings: Secondary | ICD-10-CM | POA: Insufficient documentation

## 2011-06-23 MED ORDER — DILTIAZEM HCL ER BEADS 360 MG PO CP24: 360.0000 mg | ORAL_CAPSULE | Freq: Every day | ORAL | Status: DC

## 2011-06-23 NOTE — Progress Notes (Signed)
  Subjective:    [name] is a 61 y.o. female who presents for an annual exam. The patient has no complaints today. The patient is not sexually active. GYN screening history: last pap: was normal and last mammogram: was normal. The patient wears seatbelts: yes. The patient participates in regular exercise: yes. Has the patient ever been transfused or tattooed?: no. The patient reports that there is not domestic violence in her life.   Menstrual History: OB History    Grav Para Term Preterm Abortions TAB SAB Ect Mult Living                  Menarche age:  No LMP recorded. Patient is postmenopausal.    The following portions of the patient's history were reviewed and updated as appropriate: allergies, current medications, past family history, past medical history, past social history, past surgical history and problem list.  Review of Systems  Review of Systems  Constitutional: Negative for activity change, appetite change and fatigue.  HENT: Negative for hearing loss, congestion, tinnitus and ear discharge.   Eyes: Negative for visual disturbance (see optho q1y -- vision corrected to 20/20 with glasses).  Respiratory: Negative for cough, chest tightness and shortness of breath.   Cardiovascular: Negative for chest pain, palpitations and leg swelling.  Gastrointestinal: Negative for abdominal pain, diarrhea, constipation and abdominal distention.  Genitourinary: Negative for urgency, frequency, decreased urine volume and difficulty urinating.  Musculoskeletal: Negative for back pain, arthralgias and gait problem.  Skin: Negative for color change, pallor and rash.  Neurological: Negative for dizziness, light-headedness, numbness and headaches.  Hematological: Negative for adenopathy. Does not bruise/bleed easily.  Psychiatric/Behavioral: Negative for suicidal ideas, confusion, sleep disturbance, self-injury, dysphoric mood, decreased concentration and agitation.  Pt is able to  read and write and can do all ADLs No risk for falling No abuse/ violence in home      Objective:    BP 124/68  Pulse 91  Temp(Src) 98.7 F (37.1 C) (Oral)  Ht 5\' 9"  (1.753 m)  Wt 310 lb 3.2 oz (140.706 kg)  BMI 45.81 kg/m2  SpO2 97% General appearance: alert, cooperative, appears stated age, no distress and morbidly obese Lungs: clear to auscultation bilaterally Breasts: normal appearance, no masses or tenderness Heart: regular rate and rhythm, S1, S2 normal, no murmur, click, rub or gallop Abdomen: soft, non-tender; bowel sounds normal; no masses,  no organomegaly Pelvic: cervix normal in appearance, external genitalia normal, no adnexal masses or tenderness, no cervical motion tenderness, rectovaginal septum normal, uterus normal size, shape, and consistency and vagina normal without discharge Extremities: extremities normal, atraumatic, no cyanosis or edema   Assessment:   . gyn only exam   Plan:     Await pap smear results. Breast self exam technique reviewed and patient encouraged to perform self-exam monthly. Agricultural engineer distributed. Follow up as needed. Mammogram. Pap smear. ---pap and breast exam done--- f/u pcp for cpe

## 2011-06-23 NOTE — Patient Instructions (Signed)
Pap Test Pap tests are obtained from the cervix and vagina to detect the presence of cancer cells or infections. A Pap test is done to screen for cervical cancer.  Who should have Pap tests:  The first Pap test should be done at age 63.   Between ages 21 and 29, Pap tests are repeated every 2 years.   Beginning at age 30, you are advised to have a Pap test every 3 years as long as your past 3 Pap tests have been normal.   Some women have medical problems that increase the chance of getting cervical cancer. Talk to your caregiver about these problems. It is especially important to talk to your caregiver if a new problem develops soon after your last Pap test. In these cases, your caregiver may recommend more frequent screening and Pap tests.   The above recommendations are the same for women who have or have not gotten the vaccine for HPV (Human Papillomavirus).   If you had a hysterectomy for a problem that was not a cancer or a condition that could lead to cancer, then you no longer need Pap tests. However, even if you no longer need a Pap test, a regular exam is a good idea to make sure no other problems are starting.    If you are between ages 65 and 70, and you have had normal Pap tests going back 10 years, you no longer need Pap tests. However, even if you no longer need a Pap test, a regular exam is a good idea to make sure no other problems are starting.    If you have had past treatment for cervical cancer or a condition that could lead to cancer, you need Pap tests and screening for cancer for at least 20 years after your treatment.   If Pap tests have been discontinued, risk factors (such as a new sexual partner) need to re-assessed to determine if screening should be resumed.   Some women may need screenings more often if they are at high risk for cervical cancer.  Ask your caregiver if you should have one done more or less often. A Pap test should be performed during the  weeks before the start of menstruation. It is not as good a test during your menstrual period. Do not douche or have intercourse in the 24 hours before your exam. The specimen is usually taken from the cervix with a scraper, swab, or brush, and it is spread on a slide. This is then carefully examined with a microscope by a specialist. If bleeding occurs from the scraping, it will almost always be minor. The results of your Pap test should be available in 1 to 2 weeks. Make sure you know how you are to get your results. Call your caregiver if you have questions or concerns about your exam and test results. For the protection of your privacy, test results cannot be given over the phone. Make sure you find out the results of the Pap test. If you have not received the results within two weeks, contact your caregiver's office for the results. Do not assume everything is normal if you have not heard from your caregiver or medical facility. It is important to follow up on all of your test results. Document Released: 06/05/2004 Document Revised: 01/08/2011 Document Reviewed: 09/20/2007 [employer] Patient Information 2012 [employer]. 

## 2011-06-23 NOTE — Progress Notes (Signed)
Addended by: Arnette Norris on: 06/23/2011 11:13 AM   Modules accepted: Orders

## 2011-07-08 ENCOUNTER — Ambulatory Visit
Admission: RE | Admit: 2011-07-08 | Discharge: 2011-07-08 | Disposition: A | Discharge status: Home / Self Care | Payer: Medicare Other | Source: Ambulatory Visit | Attending: Internal Medicine | Admitting: Internal Medicine

## 2011-07-08 DIAGNOSIS — Z1231 Encounter for screening mammogram for malignant neoplasm of breast: Secondary | ICD-10-CM

## 2011-07-14 ENCOUNTER — Ambulatory Visit: Payer: Medicare Other | Admitting: Gastroenterology

## 2011-07-22 ENCOUNTER — Ambulatory Visit (INDEPENDENT_AMBULATORY_CARE_PROVIDER_SITE_OTHER): Payer: Medicare Other | Admitting: Gastroenterology

## 2011-07-22 ENCOUNTER — Encounter: Payer: Self-pay | Admitting: Gastroenterology

## 2011-07-22 DIAGNOSIS — K625 Hemorrhage of anus and rectum: Secondary | ICD-10-CM

## 2011-07-22 DIAGNOSIS — K649 Unspecified hemorrhoids: Secondary | ICD-10-CM

## 2011-07-22 MED ORDER — HYDROCORTISONE ACETATE 25 MG RE SUPP: 25.0000 mg | Freq: Two times a day (BID) | RECTAL | Status: DC

## 2011-07-22 MED ORDER — PEG-KCL-NACL-NASULF-NA ASC-C 100 G PO SOLR: 1.0000 | Freq: Once | ORAL | Status: DC

## 2011-07-22 NOTE — Progress Notes (Signed)
History of Present Illness:  [name] is a 60-year-old Afro-American female referred at the request of Dr. Paz for evaluation of rectal bleeding and pain. For several months she has noted blood mixed with her stools. She has had intermittent rectal discomfort. She tends to be constipated. She denies abdominal pain. 2008 colonoscopy demonstrated diverticulosis and internal hemorrhoids.    Past Medical History  Diagnosis Date  . DIABETES MELLITUS 10/06/2006  . HYPERLIPIDEMIA 01/11/2009  . UNSPECIFIED ANEMIA 12/10/2009  . DEPRESSION 09/26/2008  . OBSTRUCTIVE SLEEP APNEA 06/23/2008    Severe OSA per sleep study 2010, Rx a CPAP  . HYPERTENSION 10/06/2006  . DEGENERATIVE JOINT DISEASE 10/06/2006  . Pain in joint, multiple sites 11/10/2006  . INSOMNIA 09/26/2008  . CHEST PAIN 11/18/2007  . Diverticulosis     2008,2005  . Internal hemorrhoids    Past Surgical History  Procedure Date  . Right knee replacement 2005  . Left knee replacement 07/2007   family history includes Asthma in her mother; Diabetes in her brother, mother, and sister; Hypertension in her brother and sister; Pancreatic cancer in her brother; and Stroke in her mother. Current Outpatient Prescriptions  Medication Sig Dispense Refill  . aspirin 325 MG tablet Take 325 mg by mouth daily.        . benazepril-hydrochlorthiazide (LOTENSIN HCT) 20-12.5 MG per tablet Take 3 tablets by mouth every morning.  270 tablet  1  . Blood Glucose Monitoring Suppl (ACCU-CHEK AVIVA PLUS) W/DEVICE KIT 1 Device by Does not apply route once.  1 kit  0  . carvedilol (COREG) 25 MG tablet Take 1 tablet (25 mg total) by mouth 2 (two) times daily.  180 tablet  2  . cloNIDine (CATAPRES) 0.2 MG tablet Take 1 tablet (0.2 mg total) by mouth 2 (two) times daily.  180 tablet  0  . diltiazem (TAZTIA XT) 360 MG 24 hr capsule Take 1 capsule (360 mg total) by mouth daily.  90 capsule  3  . glucose blood (ACCU-CHEK AVIVA) test strip 1 each by Other route 2 (two) times  daily. And lancets 2/day, 250.03  100 each  12  . insulin lispro protamine-insulin lispro (HUMALOG 50/50) (50-50) 100 UNIT/ML SUSP Inject 45 Units into the skin 2 (two) times daily before a meal.       . Insulin Pen Needle 29G X 12MM MISC Use as directed three times daily  100 each  3  . metFORMIN (GLUCOPHAGE) 1000 MG tablet Take 1 tablet (1,000 mg total) by mouth 2 (two) times daily with a meal.  180 tablet  1  . pantoprazole (PROTONIX) 40 MG tablet Take 40 mg by mouth daily as needed.       . potassium chloride (KLOR-CON) 10 MEQ CR tablet Take 1 tablet (10 mEq total) by mouth daily.  90 tablet  2  . rosuvastatin (CRESTOR) 40 MG tablet Take 1 tablet (40 mg total) by mouth daily.  90 tablet  1  . telmisartan (MICARDIS) 40 MG tablet Take 1 tablet (40 mg total) by mouth daily.  90 tablet  1   Allergies as of 07/22/2011  . (No Known Allergies)    reports that she quit smoking about 16 years ago. Her smoking use included Cigarettes. She has a .8 pack-year smoking history. She has never used smokeless tobacco. She reports that she drinks alcohol. She reports that she does not use illicit drugs.     Review of Systems: Pertinent positive and negative review of systems were noted   in the above HPI section. All other review of systems were otherwise negative.  Vital signs were reviewed in today's medical record Physical Exam: General: Well developed , well nourished, no acute distress Head: Normocephalic and atraumatic Eyes:  sclerae anicteric, EOMI Ears: Normal auditory acuity Mouth: No deformity or lesions Neck: Supple, no masses or thyromegaly Lungs: Clear throughout to auscultation Heart: Regular rate and rhythm; no murmurs, rubs or bruits Abdomen: Soft, non tender and non distended. No masses, hepatosplenomegaly or hernias noted. Normal Bowel sounds Rectal: An external skin tag is present Musculoskeletal: Symmetrical with no gross deformities  Skin: No lesions on visible  extremities Pulses:  Normal pulses noted Extremities: No clubbing, cyanosis, edema or deformities noted Neurological: Alert oriented x 4, grossly nonfocal Cervical Nodes:  No significant cervical adenopathy Inguinal Nodes: No significant inguinal adenopathy Psychological:  Alert and cooperative. Normal mood and affect       

## 2011-07-22 NOTE — Assessment & Plan Note (Addendum)
Rectal bleeding is most likely secondary to hemorrhoids, in view of her bleeding and rectal pain. A more proximal colonic bleeding source is less likely.  Recommendations #1 Anusol HC suppositories #2 colonoscopy #3 band ligation of hemorrhoids at the time of colonoscopy if no other bleeding source is demonstrated

## 2011-07-22 NOTE — Patient Instructions (Signed)
You have been scheduled for a colonoscopy with hemorrhoidal banding. Please follow written instructions given to you at your visit today.  Please pick up your prep kit at the pharmacy within the next 1-3 days.

## 2011-08-01 ENCOUNTER — Telehealth: Payer: Self-pay | Admitting: Internal Medicine

## 2011-08-01 ENCOUNTER — Encounter (HOSPITAL_COMMUNITY): Payer: Self-pay

## 2011-08-01 ENCOUNTER — Ambulatory Visit (HOSPITAL_COMMUNITY)
Admission: RE | Admit: 2011-08-01 | Discharge: 2011-08-01 | Disposition: A | Discharge status: Home / Self Care | Payer: Medicare Other | Source: Ambulatory Visit | Attending: Gastroenterology | Admitting: Gastroenterology

## 2011-08-01 ENCOUNTER — Encounter (HOSPITAL_COMMUNITY)
Admission: RE | Disposition: A | Discharge status: Home / Self Care | Payer: Self-pay | Source: Ambulatory Visit | Attending: Gastroenterology

## 2011-08-01 DIAGNOSIS — G4733 Obstructive sleep apnea (adult) (pediatric): Secondary | ICD-10-CM | POA: Insufficient documentation

## 2011-08-01 DIAGNOSIS — Z79899 Other long term (current) drug therapy: Secondary | ICD-10-CM | POA: Insufficient documentation

## 2011-08-01 DIAGNOSIS — Z794 Long term (current) use of insulin: Secondary | ICD-10-CM | POA: Insufficient documentation

## 2011-08-01 DIAGNOSIS — E119 Type 2 diabetes mellitus without complications: Secondary | ICD-10-CM | POA: Insufficient documentation

## 2011-08-01 DIAGNOSIS — D126 Benign neoplasm of colon, unspecified: Secondary | ICD-10-CM

## 2011-08-01 DIAGNOSIS — I10 Essential (primary) hypertension: Secondary | ICD-10-CM | POA: Insufficient documentation

## 2011-08-01 DIAGNOSIS — Z96659 Presence of unspecified artificial knee joint: Secondary | ICD-10-CM | POA: Insufficient documentation

## 2011-08-01 DIAGNOSIS — E785 Hyperlipidemia, unspecified: Secondary | ICD-10-CM | POA: Insufficient documentation

## 2011-08-01 DIAGNOSIS — Z7982 Long term (current) use of aspirin: Secondary | ICD-10-CM | POA: Insufficient documentation

## 2011-08-01 DIAGNOSIS — K648 Other hemorrhoids: Secondary | ICD-10-CM

## 2011-08-01 DIAGNOSIS — K625 Hemorrhage of anus and rectum: Secondary | ICD-10-CM | POA: Insufficient documentation

## 2011-08-01 DIAGNOSIS — K5730 Diverticulosis of large intestine without perforation or abscess without bleeding: Secondary | ICD-10-CM | POA: Insufficient documentation

## 2011-08-01 HISTORY — PX: COLONOSCOPY: SHX5424

## 2011-08-01 SURGERY — COLONOSCOPY
Anesthesia: Moderate Sedation

## 2011-08-01 MED ORDER — METOPROLOL TARTRATE 1 MG/ML IV SOLN
5.0000 mg | Freq: Once | INTRAVENOUS | Status: AC
  Administered 2011-08-01: 5 mg via INTRAVENOUS

## 2011-08-01 MED ORDER — FENTANYL CITRATE 0.05 MG/ML IJ SOLN
INTRAMUSCULAR | Status: AC
  Filled 2011-08-01: qty 2

## 2011-08-01 MED ORDER — MIDAZOLAM HCL 5 MG/5ML IJ SOLN
INTRAMUSCULAR | Status: DC | PRN
  Administered 2011-08-01 (×3): 2 mg via INTRAVENOUS
  Administered 2011-08-01: 1 mg via INTRAVENOUS

## 2011-08-01 MED ORDER — METOPROLOL TARTRATE 1 MG/ML IV SOLN
INTRAVENOUS | Status: AC
  Filled 2011-08-01: qty 5

## 2011-08-01 MED ORDER — MIDAZOLAM HCL 10 MG/2ML IJ SOLN
INTRAMUSCULAR | Status: AC
  Filled 2011-08-01: qty 2

## 2011-08-01 MED ORDER — ACETAMINOPHEN-CODEINE #3 300-30 MG PO TABS: 1.0000 | ORAL_TABLET | ORAL | Status: AC | PRN

## 2011-08-01 MED ORDER — SODIUM CHLORIDE 0.9 % IV SOLN
INTRAVENOUS | Status: DC
  Administered 2011-08-01: 500 mL via INTRAVENOUS

## 2011-08-01 MED ORDER — FENTANYL NICU IV SYRINGE 50 MCG/ML
INJECTION | INTRAMUSCULAR | Status: DC | PRN
  Administered 2011-08-01 (×4): 25 ug via INTRAVENOUS

## 2011-08-01 NOTE — H&P (View-Only) (Signed)
History of Present Illness:  [name] is a 61 year old Afro-American female referred at the request of Dr. Drue Novel for evaluation of rectal bleeding and pain. For several months she has noted blood mixed with her stools. She has had intermittent rectal discomfort. She tends to be constipated. She denies abdominal pain. 2008 colonoscopy demonstrated diverticulosis and internal hemorrhoids.    Past Medical History  Diagnosis Date  . DIABETES MELLITUS 10/06/2006  . HYPERLIPIDEMIA 01/11/2009  . UNSPECIFIED ANEMIA 12/10/2009  . DEPRESSION 09/26/2008  . OBSTRUCTIVE SLEEP APNEA 06/23/2008    Severe OSA per sleep study 2010, Rx a CPAP  . HYPERTENSION 10/06/2006  . DEGENERATIVE JOINT DISEASE 10/06/2006  . Pain in joint, multiple sites 11/10/2006  . INSOMNIA 09/26/2008  . CHEST PAIN 11/18/2007  . Diverticulosis     9147,8295  . Internal hemorrhoids    Past Surgical History  Procedure Date  . Right knee replacement 2005  . Left knee replacement 07/2007   family history includes Asthma in her mother; Diabetes in her brother, mother, and sister; Hypertension in her brother and sister; Pancreatic cancer in her brother; and Stroke in her mother. Current Outpatient Prescriptions  Medication Sig Dispense Refill  . aspirin 325 MG tablet Take 325 mg by mouth daily.        . benazepril-hydrochlorthiazide (LOTENSIN HCT) 20-12.5 MG per tablet Take 3 tablets by mouth every morning.  270 tablet  1  . Blood Glucose Monitoring Suppl (ACCU-CHEK AVIVA PLUS) W/DEVICE KIT 1 Device by Does not apply route once.  1 kit  0  . carvedilol (COREG) 25 MG tablet Take 1 tablet (25 mg total) by mouth 2 (two) times daily.  180 tablet  2  . cloNIDine (CATAPRES) 0.2 MG tablet Take 1 tablet (0.2 mg total) by mouth 2 (two) times daily.  180 tablet  0  . diltiazem (TAZTIA XT) 360 MG 24 hr capsule Take 1 capsule (360 mg total) by mouth daily.  90 capsule  3  . glucose blood (ACCU-CHEK AVIVA) test strip 1 each by Other route 2 (two) times  daily. And lancets 2/day, 250.03  100 each  12  . insulin lispro protamine-insulin lispro (HUMALOG 50/50) (50-50) 100 UNIT/ML SUSP Inject 45 Units into the skin 2 (two) times daily before a meal.       . Insulin Pen Needle 29G X MISC Use as directed three times daily  100 each  3  . metFORMIN (GLUCOPHAGE) 1000 MG tablet Take 1 tablet (1,000 mg total) by mouth 2 (two) times daily with a meal.  180 tablet  1  . pantoprazole (PROTONIX) 40 MG tablet Take 40 mg by mouth daily as needed.       . potassium chloride (KLOR-CON) 10 MEQ CR tablet Take 1 tablet (10 mEq total) by mouth daily.  90 tablet  2  . rosuvastatin (CRESTOR) 40 MG tablet Take 1 tablet (40 mg total) by mouth daily.  90 tablet  1  . telmisartan (MICARDIS) 40 MG tablet Take 1 tablet (40 mg total) by mouth daily.  90 tablet  1   Allergies as of 07/22/2011  . (No Known Allergies)    reports that she quit smoking about 16 years ago. Her smoking use included Cigarettes. She has a .8 pack-year smoking history. She has never used smokeless tobacco. She reports that she drinks alcohol. She reports that she does not use illicit drugs.     Review of Systems: Pertinent positive and negative review of systems were noted  in the above HPI section. All other review of systems were otherwise negative.  Vital signs were reviewed in today's medical record Physical Exam: General: Well developed , well nourished, no acute distress Head: Normocephalic and atraumatic Eyes:  sclerae anicteric, EOMI Ears: Normal auditory acuity Mouth: No deformity or lesions Neck: Supple, no masses or thyromegaly Lungs: Clear throughout to auscultation Heart: Regular rate and rhythm; no murmurs, rubs or bruits Abdomen: Soft, non tender and non distended. No masses, hepatosplenomegaly or hernias noted. Normal Bowel sounds Rectal: An external skin tag is present Musculoskeletal: Symmetrical with no gross deformities  Skin: No lesions on visible  extremities Pulses:  Normal pulses noted Extremities: No clubbing, cyanosis, edema or deformities noted Neurological: Alert oriented x 4, grossly nonfocal Cervical Nodes:  No significant cervical adenopathy Inguinal Nodes: No significant inguinal adenopathy Psychological:  Alert and cooperative. Normal mood and affect

## 2011-08-01 NOTE — Interval H&P Note (Signed)
History and Physical Interval Note:  08/01/2011 12:38 PM  [name]  has presented today for surgery, with the diagnosis of hemorrhoids  The various methods of treatment have been discussed with the patient and family. After consideration of risks, benefits and other options for treatment, the patient has consented to  Procedure(s) (LRB): COLONOSCOPY (N/A) HEMORRHOID BANDING (N/A) as a surgical intervention .  The patients' history has been reviewed, patient examined, no change in status, stable for surgery.  I have reviewed the patients' chart and labs.  Questions were answered to the patient's satisfaction.     The recent H&P (dated *07/22/11**) was reviewed, the patient was examined and there is no change in the patients condition since that H&P was completed.   [name]  08/01/2011, 12:38 PM   [name]

## 2011-08-01 NOTE — Telephone Encounter (Signed)
Spoke with pt and she states she is feeling better now, her arms are just a little sore. Dr. Arlyce Dice suggested pt take a bottle of mag citrate asap. Pt states she has no way of going out to get the mag citrate. Dr. Arlyce Dice suggests that if she is willing she could take the second dose of the moviprep. Pt states she will take the moviprep.

## 2011-08-01 NOTE — Telephone Encounter (Signed)
Spoke at 445 AM - pain in legs and arms after first dose of MoviPrep  I had her not take second half  Stools apparently watery and loose. She lived alone and did not drive so could not get other laxatives to try.  We need to contact her this AM with further prep plans per Dr. Arlyce Dice - she reports having a 1230 procedure scheduled.

## 2011-08-01 NOTE — Op Note (Signed)
[facility] [address] [place], [place]  [address]  COLONOSCOPY PROCEDURE REPORT  PATIENT:  [name], [name]  MR#:  [id number] BIRTHDATE:  [birth year], 60 yrs. old  GENDER:  female ENDOSCOPIST:  Barbette Hair. Arlyce Dice, MD REF. BY:  Willow Ora, M.D. PROCEDURE DATE:  08/01/2011 PROCEDURE:  Colonoscopy with snare polypectomy, Colon w/ banding hemorrhoid ASA CLASS:  Class II INDICATIONS:  rectal bleeding MEDICATIONS:   These medications were titrated to patient response per physician's verbal order, Fentanyl 100 mcg IV, Versed 7 mg IV  DESCRIPTION OF PROCEDURE:   After the risks benefits and alternatives of the procedure were thoroughly explained, informed consent was obtained.  Digital rectal exam was performed and revealed no abnormalities.   The Pentax Colonoscope [id number] endoscope was introduced through the anus and advanced to the cecum, which was identified by the ileocecal valve, without limitations.  The quality of the prep was excellent, using MoviPrep.  The instrument was then slowly withdrawn as the colon was fully examined. <<PROCEDUREIMAGES>>  FINDINGS:  A sessile polyp was found in the sigmoid colon. It was 3 mm in size. It was found 18 cm from the point of entry. Polyp was snared without cautery. Retrieval was successful (see image9). snare polyp  Moderate diverticulosis was found (see image3). sigmoid to the descending colon  Internal Hemorrhoids were found. banding After switching scopes to the endoscope with band ligator attached to the end, the endoscope was inserted into the rectal vault and retroflexed (see image10). 3 internal hemorrhoid bundles were identified and were banded  This was otherwise a normal examination of the colon. The cecum was visualized but not entered by the colonoscope (see image5).   Retroflexed views in the rectum revealed no abnormalities.    The time to cecum =  minutes. The scope was then withdrawn in  minutes from the cecum and  the procedure completed. COMPLICATIONS:  None ENDOSCOPIC IMPRESSION: 1) 3 mm sessile polyp in the sigmoid colon 2) Moderate diverticulosis 3) Internal hemorrhoids - s/p band ligation 4) Otherwise normal examination RECOMMENDATIONS: 1) If the polyp(s) removed today are proven to be adenomatous (pre-cancerous) polyps, you will need a repeat colonoscopy in 5 years. Otherwise you should continue to follow colorectal cancer screening guidelines for "routine risk" patients with colonoscopy in 10 years. You will receive a letter within 1-2 weeks with the results of your biopsy as well as final recommendations. Please call my office if you have not received a letter after 3 weeks. 2) Call office for follow-up appointment in 1 month REPEAT EXAM:   You will receive a letter from Dr. Arlyce Dice in 1-2 weeks, after reviewing the final pathology, with followup recommendations.  ______________________________ Barbette Hair Arlyce Dice, MD  CC:  n. eSIGNED:   Barbette Hair. Donnica Jarnagin at 08/01/2011 01:10 PM  Narda Amber, [id number]

## 2011-08-01 NOTE — Discharge Instructions (Signed)
Colonoscopy Care After Read the instructions outlined below and refer to this sheet in the next few weeks. These discharge instructions provide you with general information on caring for yourself after you leave the hospital. Your doctor may also give you specific instructions. While your treatment has been planned according to the most current medical practices available, unavoidable complications occasionally occur. If you have any problems or questions after discharge, call your doctor. HOME CARE INSTRUCTIONS ACTIVITY:  You may resume your regular activity, but move at a slower pace for the next 24 hours.   Take frequent rest periods for the next 24 hours.   Walking will help get rid of the air and reduce the bloated feeling in your belly (abdomen).   No driving for 24 hours (because of the medicine (anesthesia) used during the test).   You may shower.   Do not sign any important legal documents or operate any machinery for 24 hours (because of the anesthesia used during the test).  NUTRITION:  Drink plenty of fluids.   You may resume your normal diet as instructed by your doctor.   Begin with a light meal and progress to your normal diet. Heavy or fried foods are harder to digest and may make you feel sick to your stomach (nauseated).   Avoid alcoholic beverages for 24 hours or as instructed.  MEDICATIONS:  You may resume your normal medications unless your doctor tells you otherwise.  WHAT TO EXPECT TODAY:  Some feelings of bloating in the abdomen.   Passage of more gas than usual.   Spotting of blood in your stool or on the toilet paper.  IF YOU HAD POLYPS REMOVED DURING THE COLONOSCOPY:  No aspirin products for 7 days or as instructed.   No alcohol for 7 days or as instructed.   Eat a soft diet for the next 24 hours.  FINDING OUT THE RESULTS OF YOUR TEST Not all test results are available during your visit. If your test results are not back during the visit, make an  appointment with your caregiver to find out the results. Do not assume everything is normal if you have not heard from your caregiver or the medical facility. It is important for you to follow up on all of your test results.  SEEK IMMEDIATE MEDICAL CARE IF:  You have more than a spotting of blood in your stool.   Your belly is swollen (abdominal distention).   You are nauseated or vomiting.   You have a fever.   You have abdominal pain or discomfort that is severe or gets worse throughout the day.  Document Released: 12/11/2003 Document Revised: 04/17/2011 Document Reviewed: 12/09/2007 [employer] Patient Information 2012 [employer]. 

## 2011-08-04 ENCOUNTER — Telehealth: Payer: Self-pay | Admitting: Endocrinology

## 2011-08-04 ENCOUNTER — Encounter: Payer: Self-pay | Admitting: Gastroenterology

## 2011-08-04 ENCOUNTER — Encounter (HOSPITAL_COMMUNITY): Payer: Self-pay | Admitting: Gastroenterology

## 2011-08-04 NOTE — Telephone Encounter (Signed)
The pt called and stated she wanted a cbg meter called into the pharmacy.  I advised her to call the pharmacy and she stated she "tried" that and they told her to call us.  Thanks!

## 2011-08-04 NOTE — Telephone Encounter (Signed)
Rx for Accu Check Monitor, test strips and lancets have been sent to pharmacy on 06/19/2010. Pt informed rx is at pharmacy.

## 2011-08-13 ENCOUNTER — Other Ambulatory Visit: Payer: Self-pay | Admitting: Internal Medicine

## 2011-08-13 NOTE — Telephone Encounter (Signed)
Refill done.  

## 2011-09-05 ENCOUNTER — Encounter: Payer: Self-pay | Admitting: *Deleted

## 2011-09-05 ENCOUNTER — Telehealth: Payer: Self-pay | Admitting: Internal Medicine

## 2011-09-05 ENCOUNTER — Ambulatory Visit (INDEPENDENT_AMBULATORY_CARE_PROVIDER_SITE_OTHER): Payer: Medicare Other | Admitting: Internal Medicine

## 2011-09-05 VITALS — BP 148/90 | HR 87 | Temp 97.8°F | Wt 315.0 lb

## 2011-09-05 DIAGNOSIS — E785 Hyperlipidemia, unspecified: Secondary | ICD-10-CM

## 2011-09-05 DIAGNOSIS — I10 Essential (primary) hypertension: Secondary | ICD-10-CM

## 2011-09-05 DIAGNOSIS — D649 Anemia, unspecified: Secondary | ICD-10-CM

## 2011-09-05 DIAGNOSIS — E119 Type 2 diabetes mellitus without complications: Secondary | ICD-10-CM

## 2011-09-05 LAB — LIPID PANEL
Cholesterol: 145 mg/dL (ref 0–200)
HDL: 76.4 mg/dL (ref >39.00)
Triglycerides: 56 mg/dL (ref 0.0–149.0)

## 2011-09-05 MED ORDER — DILTIAZEM HCL ER BEADS 360 MG PO CP24: 360.0000 mg | ORAL_CAPSULE | Freq: Every day | ORAL | Status: DC

## 2011-09-05 MED ORDER — CLONIDINE HCL 0.2 MG PO TABS: 0.2000 mg | ORAL_TABLET | Freq: Two times a day (BID) | ORAL | Status: DC

## 2011-09-05 NOTE — Assessment & Plan Note (Signed)
Per Dr. Everardo All Feet exam normal today Reports she has schedule  an eye exam next month.

## 2011-09-05 NOTE — Telephone Encounter (Signed)
Error below.   Faxed letter.

## 2011-09-05 NOTE — Telephone Encounter (Signed)
Pt states she needs a letter faxed with her name, todays date, name of provider and signature for transportation reasons. Fax # [phone]

## 2011-09-05 NOTE — Progress Notes (Signed)
  Subjective:    Patient ID: [name], female    DOB: [birth year], 61 y.o.   MRN: [id number]  HPI Routine office visit, in general feels well. Diabetes--- followup by endocrinology High cholesterol: Good medication compliance with Crestor 40 mg  Past Medical History:  DIABETES MELLITUS  HYPERTENSION  Severe OSA per sleep study 2010, Rx a CPAP  DJD, pain in multiple joints  2006 Cscope --TICs  Depression (Dx 09-2008)  CHEST PAIN:  2004--Cath neg, arterial LE dopplers neg  06-2003-- abd. u/s fatty liver  01-2005-- CP...neg u/s GB, neg HIDA  10-06--saw cards--cardiolite neg  CP--abnormal stress test, Cath 9-09: normal   Past Surgical History:  2005 -R- Knne replacecement  3-09 TKR left  Social History:  Widow, 4 kids  tobacco-- no  ETOH-- rarely  not working, on disability  exercising a little more   Review of Systems In general, she reports she is doing well, she takes Protonix as needed only with good results. Denies any feet burning or paresthesias OSA--does not use a CPAP, uses nocturnal oxygen. Her BP is slightly elevated today, she does not take BPs at home. BPs at the office varies  from normal to moderately elevated.    Objective:   Physical Exam General -- alert, well-developed, and overweight appearing. No apparent distress.  Lungs -- normal respiratory effort, no intercostal retractions, no accessory muscle use, and normal breath sounds.   Heart-- normal rate, regular rhythm, no murmur, and no gallop.   DIABETIC FEET EXAM: No lower extremity edema Normal pedal pulses bilaterally Skin and nails are normal without calluses Pinprick examination of the feet normal. Neurologic-- alert & oriented X3 and strength normal in all extremities.      Assessment & Plan:

## 2011-09-05 NOTE — Telephone Encounter (Signed)
Refill done.  

## 2011-09-05 NOTE — Assessment & Plan Note (Signed)
Good compliance with Crestor 40 mg. Labs.

## 2011-09-05 NOTE — Assessment & Plan Note (Signed)
Well-controlled?Marland Kitchen No change for now, will ask patient to check BP in the ambulatory setting and keep a record RTC 3 months

## 2011-09-05 NOTE — Patient Instructions (Signed)
Please check your blood pressure every Tuesday and Friday at the pharmacy, write  The numbers down. Call me if your blood pressures are usually more than 150/85. Please come back in 3 months.

## 2011-09-05 NOTE — Assessment & Plan Note (Signed)
Last hemoglobin stable, had a colonoscopy recently.

## 2011-09-08 ENCOUNTER — Encounter: Payer: Self-pay | Admitting: Gastroenterology

## 2011-09-08 ENCOUNTER — Encounter: Payer: Self-pay | Admitting: Internal Medicine

## 2011-09-08 ENCOUNTER — Ambulatory Visit (INDEPENDENT_AMBULATORY_CARE_PROVIDER_SITE_OTHER): Payer: Medicare Other | Admitting: Gastroenterology

## 2011-09-08 VITALS — BP 104/60 | HR 88 | Ht 69.0 in | Wt 317.0 lb

## 2011-09-08 DIAGNOSIS — K648 Other hemorrhoids: Secondary | ICD-10-CM

## 2011-09-08 NOTE — Progress Notes (Signed)
History of Present Illness:  [name] has returned following band ligation for hemorrhoids and colonoscopy. Since her treatment she is felt significantly improved. Specifically, she is without pain or bleeding. A hyperplastic polyp was removed at colonoscopy.    Review of Systems: Pertinent positive and negative review of systems were noted in the above HPI section. All other review of systems were otherwise negative.    Current Medications, Allergies, Past Medical History, Past Surgical History, Family History and Social History were reviewed in [employer] electronic medical record  Vital signs were reviewed in today's medical record. Physical Exam: General: Well developed , well nourished, no acute distress

## 2011-09-08 NOTE — Assessment & Plan Note (Signed)
Excellent response to band ligation. Rectal bleeding undoubtedly was related to hemorrhoids.

## 2011-09-08 NOTE — Patient Instructions (Signed)
Follow up as needed

## 2011-09-09 ENCOUNTER — Other Ambulatory Visit: Payer: Self-pay | Admitting: Endocrinology

## 2011-09-09 ENCOUNTER — Encounter: Payer: Self-pay | Admitting: *Deleted

## 2011-09-15 ENCOUNTER — Other Ambulatory Visit: Payer: Self-pay | Admitting: Endocrinology

## 2011-09-19 ENCOUNTER — Other Ambulatory Visit (INDEPENDENT_AMBULATORY_CARE_PROVIDER_SITE_OTHER): Payer: Medicare Other

## 2011-09-19 ENCOUNTER — Ambulatory Visit (INDEPENDENT_AMBULATORY_CARE_PROVIDER_SITE_OTHER): Payer: Medicare Other | Admitting: Endocrinology

## 2011-09-19 ENCOUNTER — Encounter: Payer: Self-pay | Admitting: Endocrinology

## 2011-09-19 VITALS — BP 118/82 | HR 78 | Temp 97.4°F | Ht 67.0 in | Wt 319.0 lb

## 2011-09-19 DIAGNOSIS — E119 Type 2 diabetes mellitus without complications: Secondary | ICD-10-CM

## 2011-09-19 LAB — HEMOGLOBIN A1C: Hgb A1c MFr Bld: 8 % — ABNORMAL HIGH (ref 4.6–6.5)

## 2011-09-19 NOTE — Progress Notes (Signed)
Subjective:    Patient ID: [name], female    DOB: 1950/07/28, 61 y.o.   MRN: [id number]  HPI pt returns for f/u of type 2 dm (dx'ed 2002).  pt states she feels well in general.  she brings a record of her cbg's which i have reviewed today. It varies from 170-300.  There is no trend throughout the day. Past Medical History  Diagnosis Date  . HYPERLIPIDEMIA 01/11/2009  . DEPRESSION 09/26/2008  . HYPERTENSION 10/06/2006  . DEGENERATIVE JOINT DISEASE 10/06/2006  . Pain in joint, multiple sites 11/10/2006  . INSOMNIA 09/26/2008  . CHEST PAIN 11/18/2007  . Diverticulosis     4098,1191  . Internal hemorrhoids   . OBSTRUCTIVE SLEEP APNEA 06/23/2008    Severe OSA per sleep study 2010, Rx a CPAP  . DIABETES MELLITUS 10/06/2006  . UNSPECIFIED ANEMIA 12/10/2009    Past Surgical History  Procedure Date  . Right knee replacement 2005  . Left knee replacement 07/2007  . Colonoscopy 08/01/2011    Procedure: COLONOSCOPY;  Surgeon: Louis Meckel, MD;  Location: [facility];  Service: Endoscopy;  Laterality: N/A;    History   Social History  . Marital Status: Widowed    Spouse Name: N/A    Number of Children: 4  . Years of Education: N/A   Occupational History  .     Social History Main Topics  . Smoking status: Former Smoker -- 0.2 packs/day for 4 years    Types: Cigarettes    Quit date: 05/13/1995  . Smokeless tobacco: Never Used  . Alcohol Use: Yes     Rarely  . Drug Use: No  . Sexually Active: Not on file   Other Topics Concern  . Not on file   Social History Narrative  . No narrative on file    Current Outpatient Prescriptions on File Prior to Visit  Medication Sig Dispense Refill  . aspirin 325 MG tablet Take 325 mg by mouth daily.        . BD ULTRA-FINE PEN NEEDLES 29G X 12.7MM MISC use with HUMALOG KWIKPEN  100 each  4  . benazepril-hydrochlorthiazide (LOTENSIN HCT) 20-12.5 MG per tablet Take 3 tablets by mouth every morning.  270 tablet  1  . Blood Glucose  Monitoring Suppl (ACCU-CHEK AVIVA PLUS) W/DEVICE KIT 1 Device by Does not apply route once.  1 kit  0  . carvedilol (COREG) 25 MG tablet Take 1 tablet (25 mg total) by mouth 2 (two) times daily.  180 tablet  2  . cloNIDine (CATAPRES) 0.2 MG tablet Take 1 tablet (0.2 mg total) by mouth 2 (two) times daily.  180 tablet  1  . diltiazem (TAZTIA XT) 360 MG 24 hr capsule Take 1 capsule (360 mg total) by mouth daily.  90 capsule  3  . glucose blood (ACCU-CHEK AVIVA) test strip 1 each by Other route 2 (two) times daily. And lancets 2/day, 250.03  100 each  12  . HUMALOG MIX 50/50 KWIKPEN (50-50) 100 UNIT/ML SUSP inject 45 units subcutaneously twice a day  30 mL  4  . Insulin Pen Needle 29G X MISC Use as directed three times daily  100 each  3  . metFORMIN (GLUCOPHAGE) 1000 MG tablet Take 1 tablet (1,000 mg total) by mouth 2 (two) times daily with a meal.  180 tablet  1  . MICARDIS 40 MG tablet TAKE 1 TABLET BY MOUTH ONCE DAILY  90 tablet  1  . pantoprazole (PROTONIX)  40 MG tablet Take 40 mg by mouth daily as needed.       . potassium chloride (KLOR-CON) 10 MEQ CR tablet Take 1 tablet (10 mEq total) by mouth daily.  90 tablet  2  . rosuvastatin (CRESTOR) 40 MG tablet Take 1 tablet (40 mg total) by mouth daily.  90 tablet  1  . DISCONTD: rosuvastatin (CRESTOR) 20 MG tablet 1 1/2 tab by mouth daily.  135 tablet  1    No Known Allergies  Family History  Problem Relation Age of Onset  . Asthma Mother   . Diabetes Mother   . Stroke Mother   . Diabetes Sister   . Hypertension Sister   . Diabetes Brother   . Hypertension Brother   . Pancreatic cancer Brother     BP 118/82  Pulse 78  Temp(Src) 97.4 F (36.3 C) (Oral)  Ht 5\' 7"  (1.702 m)  Wt 319 lb (144.697 kg)  BMI 49.96 kg/m2  SpO2 95%  Review of Systems denies hypoglycemia    Objective:   Physical Exam VITAL SIGNS:  See vs page GENERAL: no distress.  Obese Pulses: dorsalis pedis intact bilat.   Feet: no deformity.  There is a bony  prominence at the dorsal aspect of the right foot.  no ulcer on the feet.  feet are of normal color and temp.  1+ bilat leg edema.  There is bilateral onychomycosis, and varicosities Neuro: sensation is intact to touch on the feet.   Lab Results  Component Value Date   HGBA1C 8.0* 09/19/2011      Assessment & Plan:  DM.  Improved, but needs increased rx

## 2011-09-19 NOTE — Patient Instructions (Signed)
Please make a follow-up appointment in 3 months blood tests are being requested for you today.  You will receive a letter with results. pending the test results, please continue the insulin, 45 units 2x a day (with the first and last meals of the day). On this type of insulin schedule, meals should be eaten on a regular schedule.   check your blood sugar 2 times a day.  vary the time of day when you check, between before the 3 meals, and at bedtime.  also check if you have symptoms of your blood sugar being too high or too low.  please keep a record of the readings and bring it to your next appointment here.  please call us sooner if your blood sugar goes below 70, or if it stays over 200.

## 2011-09-22 ENCOUNTER — Telehealth: Payer: Self-pay | Admitting: *Deleted

## 2011-09-22 NOTE — Telephone Encounter (Signed)
Called pt to inform of A1c results, pt informed (letter also mailed to pt). 

## 2011-09-30 ENCOUNTER — Telehealth: Payer: Self-pay | Admitting: Endocrinology

## 2011-09-30 LAB — HM DIABETES EYE EXAM

## 2011-09-30 MED ORDER — ACCU-CHEK MULTICLIX LANCETS MISC: Status: DC

## 2011-09-30 NOTE — Telephone Encounter (Signed)
Rx sent to [facility], pt informed.

## 2011-09-30 NOTE — Telephone Encounter (Signed)
The pt called and is hoping to get a refill of her lancets.   [phone]

## 2011-10-09 ENCOUNTER — Encounter: Payer: Self-pay | Admitting: *Deleted

## 2011-11-10 ENCOUNTER — Other Ambulatory Visit: Payer: Self-pay | Admitting: Internal Medicine

## 2011-11-10 NOTE — Telephone Encounter (Signed)
Refill done.  

## 2011-11-12 ENCOUNTER — Other Ambulatory Visit: Payer: Self-pay | Admitting: Internal Medicine

## 2011-11-12 NOTE — Telephone Encounter (Signed)
Refill done.  

## 2011-12-05 ENCOUNTER — Ambulatory Visit (INDEPENDENT_AMBULATORY_CARE_PROVIDER_SITE_OTHER): Payer: Medicare Other | Admitting: Internal Medicine

## 2011-12-05 VITALS — BP 128/76 | HR 81 | Temp 97.8°F | Wt 319.0 lb

## 2011-12-05 DIAGNOSIS — E785 Hyperlipidemia, unspecified: Secondary | ICD-10-CM

## 2011-12-05 DIAGNOSIS — E119 Type 2 diabetes mellitus without complications: Secondary | ICD-10-CM

## 2011-12-05 DIAGNOSIS — I10 Essential (primary) hypertension: Secondary | ICD-10-CM

## 2011-12-05 DIAGNOSIS — F329 Major depressive disorder, single episode, unspecified: Secondary | ICD-10-CM

## 2011-12-05 DIAGNOSIS — M1990 Unspecified osteoarthritis, unspecified site: Secondary | ICD-10-CM

## 2011-12-05 DIAGNOSIS — G4733 Obstructive sleep apnea (adult) (pediatric): Secondary | ICD-10-CM

## 2011-12-05 LAB — BASIC METABOLIC PANEL
CO2: 28 mEq/L (ref 19–32)
Chloride: 100 mEq/L (ref 96–112)
Sodium: 138 mEq/L (ref 135–145)

## 2011-12-05 NOTE — Progress Notes (Signed)
  Subjective:    Patient ID: [name], female    DOB: 1950/12/27, 61 y.o.   MRN: [id number]  HPI  routine office visit In general doing well, ambulatory BP is checked frequently, her readings are quite different, range from the 120s to the 200s. Reports good medication compliance with BP meds. When asked, admits that her BP cuff is a slightly tight.   Past Medical History:   DIABETES MELLITUS   HYPERTENSION   Severe OSA per sleep study 2010, Rx a CPAP   DJD, pain in multiple joints   2006 Cscope --TICs   Depression (Dx 09-2008)   CHEST PAIN:  2004--Cath neg, arterial LE dopplers neg   06-2003-- abd. u/s fatty liver   01-2005-- CP...neg u/s GB, neg HIDA   10-06--saw cards--cardiolite neg   CP--abnormal stress test, Cath 9-09: normal   Past Surgical History:   2005 -R- Knne replacecement   3-09 TKR left    Social History:   Widow, 4 kids   tobacco-- no   ETOH-- rarely   not working, on disability   exercising a little more    Review of Systems  history of depression, currently doing well, very rarely gets sad. No suicidal ideas. History of a sleep apnea, good compliance with CPAP. Complains of occasional edema, this is normal for years, it is usually R>L ankle    Objective:   Physical Exam  General -- alert, well-developed, and overweight appearing. No apparent distress.  Lungs -- normal respiratory effort, no intercostal retractions, no accessory muscle use, and normal breath sounds.   Heart-- normal rate, regular rhythm, no murmur, and no gallop.   Extremities-- trace pretibial/ankle edema bilaterally  Psych-- Cognition and judgment appear intact. Alert and cooperative with normal attention span and concentration.  not anxious appearing and not depressed appearing.       Assessment & Plan:

## 2011-12-05 NOTE — Assessment & Plan Note (Signed)
Needs a bathing bench, prescription provided

## 2011-12-05 NOTE — Assessment & Plan Note (Signed)
On no medications, doing okay.

## 2011-12-05 NOTE — Assessment & Plan Note (Signed)
Reports good compliance with CPAP 

## 2011-12-05 NOTE — Patient Instructions (Addendum)
Call my nurse and set up a time that you can bring your BP cuff to compare  with ours

## 2011-12-05 NOTE — Assessment & Plan Note (Signed)
Reports a recent visit to her eye Dr.

## 2011-12-05 NOTE — Assessment & Plan Note (Signed)
Well-controlled per last cholesterol panel 

## 2011-12-05 NOTE — Assessment & Plan Note (Signed)
Ambulatory BPs varies from 123/74  to206/97. Reports that sometimes her BP cuff is tight. Plan: Check a BMP Asked patient to bring her BP cuff to be compared with ours No change in medications for now, BPs at the the office  have been consistently within normal.

## 2011-12-07 ENCOUNTER — Encounter: Payer: Self-pay | Admitting: Internal Medicine

## 2011-12-11 ENCOUNTER — Other Ambulatory Visit: Payer: Self-pay | Admitting: Internal Medicine

## 2011-12-12 NOTE — Telephone Encounter (Signed)
Refill done.  

## 2011-12-19 ENCOUNTER — Ambulatory Visit (INDEPENDENT_AMBULATORY_CARE_PROVIDER_SITE_OTHER): Payer: Medicare Other | Admitting: Pulmonary Disease

## 2011-12-19 ENCOUNTER — Encounter: Payer: Self-pay | Admitting: Pulmonary Disease

## 2011-12-19 ENCOUNTER — Ambulatory Visit: Payer: Medicare Other | Admitting: Endocrinology

## 2011-12-19 VITALS — BP 124/72 | HR 83 | Temp 98.1°F | Ht 68.0 in | Wt 321.4 lb

## 2011-12-19 DIAGNOSIS — G4733 Obstructive sleep apnea (adult) (pediatric): Secondary | ICD-10-CM

## 2011-12-19 NOTE — Patient Instructions (Addendum)
Continue oxygen therapy with sleep  Work aggressively on weight loss followup with me in one year.

## 2011-12-19 NOTE — Assessment & Plan Note (Signed)
The patient is continuing on oxygen therapy at night, and I've encouraged her to work aggressively on weight loss.  She understands that oxygen does not treat the actual obstructive events, but simply helps keep her oxygen level up so that she does not have as many cardiovascular complications.

## 2011-12-19 NOTE — Progress Notes (Signed)
  Subjective:    Patient ID: [name], female    DOB: Aug 29, 1950, 61 y.o.   MRN: [id number]  HPI The patient comes in today for followup of her known obstructive sleep apnea.  She has been completely intolerant to CPAP, and has been wearing nocturnal oxygen in order to prevent the complication of pulmonary hypertension.  She has been wearing this fairly consistently, and is having no issues with the treatment.  She continues to have significant daytime sleepiness and will often happen.   Review of Systems  Constitutional: Negative for fever and unexpected weight change.  HENT: Negative for ear pain, nosebleeds, congestion, sore throat, rhinorrhea, sneezing, trouble swallowing, dental problem, postnasal drip and sinus pressure.   Eyes: Negative for redness and itching.  Respiratory: Positive for shortness of breath. Negative for cough, chest tightness and wheezing.   Cardiovascular: Positive for leg swelling. Negative for palpitations.  Gastrointestinal: Negative for nausea and vomiting.  Genitourinary: Negative for dysuria.  Musculoskeletal: Positive for arthralgias. Negative for joint swelling.  Skin: Negative for rash.  Neurological: Negative for headaches.  Hematological: Bruises/bleeds easily.  Psychiatric/Behavioral: Negative for dysphoric mood. The patient is not nervous/anxious.   All other systems reviewed and are negative.       Objective:   Physical Exam Morbidly obese female in no acute distress Nose without purulence or discharge noted Lower extremities with mild edema, no cyanosis Awake, but appears sleepy, moves all 4 extremities.       Assessment & Plan:

## 2011-12-30 ENCOUNTER — Encounter: Payer: Self-pay | Admitting: Family Medicine

## 2011-12-30 ENCOUNTER — Ambulatory Visit (INDEPENDENT_AMBULATORY_CARE_PROVIDER_SITE_OTHER): Payer: Medicare Other | Admitting: Family Medicine

## 2011-12-30 ENCOUNTER — Other Ambulatory Visit (HOSPITAL_COMMUNITY)
Admission: RE | Admit: 2011-12-30 | Discharge: 2011-12-30 | Disposition: A | Discharge status: Home / Self Care | Payer: Medicare Other | Source: Ambulatory Visit | Attending: Family Medicine | Admitting: Family Medicine

## 2011-12-30 VITALS — BP 124/83 | HR 97 | Temp 98.7°F | Wt 314.2 lb

## 2011-12-30 DIAGNOSIS — Z124 Encounter for screening for malignant neoplasm of cervix: Secondary | ICD-10-CM | POA: Insufficient documentation

## 2011-12-30 DIAGNOSIS — Z01419 Encounter for gynecological examination (general) (routine) without abnormal findings: Secondary | ICD-10-CM

## 2011-12-30 DIAGNOSIS — R87615 Unsatisfactory cytologic smear of cervix: Secondary | ICD-10-CM

## 2011-12-30 NOTE — Patient Instructions (Signed)
Pap Test A Pap test is a sampling of cells from a woman's cervix. The cervix is the opening between the vagina (birth canal) and the uterus (the bottom part of the womb). The cells are scraped from the cervix during a pelvic exam. These cells are then looked at under a microscope to see if the cells are normal or to see if a cancer is developing or there are changes that suggest a cancer will develop. Cervical dysplasia is a condition in which a woman has abnormal changes in the top layer of cells of her cervix. These changes are an early sign that cervical cancer may develop. Pap tests also look for the human papilloma virus (HPV) because it has 4 types that are responsible for 70% of cervical cancer. Infections can also be found during a Pap test such as bacteria, fungus, protozoa and viruses.  Cervical cancer is harder to treat and less likely to have a good outcome if left untreated. Catching the disease at an early stage leads to a better outcome. Since the Pap test was introduced 60 years ago, deaths from cervical cancer have decreased by 70%. Every woman should keep up to date with Pap tests. RISK FACTORS FOR CERVICAL CANCER INCLUDE:   Becoming sexually active before age 18.   Being the daughter of a woman who took diethylstilbestrol (DES) during pregnancy.   Having a sexual partner who has or has had cancer of the penis.   Having a sexual partner whose past partner had cervical cancer or cervical dysplasia (early cell changes which suggest a cancer may develop).   Having a weakened immune system. An example would be HIV or other immunodeficiency disorder.   Having had a sexually transmitted infection such as chlamydia, gonorrhea or HPV.   Having had an abnormal Pap or cancer of the vagina or vulva.   Having had more than one sexual partner.   A history of cervical cancer in a woman's sister or mother.   Not using condoms with new sexual partners.   Smoking.  WHO SHOULD HAVE PAP  TESTS  A Pap test is done to screen for cervical cancer.   The first Pap test should be done at age 21.   Between ages 21 and 29, Pap tests are repeated every 2 years.   Beginning at age 30, you are advised to have a Pap test every 3 years as long as your past 3 Pap tests have been normal.   Some women have medical problems that increase the chance of getting cervical cancer. Talk to your caregiver about these problems. It is especially important to talk to your caregiver if a new problem develops soon after your last Pap test. In these cases, your caregiver may recommend more frequent screening and Pap tests.   The above recommendations are the same for women who have or have not gotten the vaccine for HPV (Human Papillomavirus).   If you had a hysterectomy for a problem that was not a cancer or a condition that could lead to cancer, then you no longer need Pap tests. However, even if you no longer need a Pap test, a regular exam is a good idea to make sure no other problems are starting.    If you are between ages 65 and 70, and you have had normal Pap tests going back 10 years, you no longer need Pap tests. However, even if you no longer need a Pap test, a regular exam is a good idea   to make sure no other problems are starting.    If you have had past treatment for cervical cancer or a condition that could lead to cancer, you need Pap tests and screening for cancer for at least 20 years after your treatment.   If Pap tests have been discontinued, risk factors (such as a new sexual partner) need to be re-assessed to determine if screening should be resumed.   Some women may need screenings more often if they are at high risk for cervical cancer.  PREPARATION FOR A PAP TEST A Pap test should be performed during the weeks before the start of menstruation. Women should not douche or have sexual intercourse for 24 hours before the test. No vaginal creams, diaphragms, or tampons should be  used for 24 hours before the test. To minimize discomfort, a woman should empty her bladder just before the exam. TAKING THE PAP TEST The caregiver will perform a pelvic exam. A metal or plastic instrument (speculum) is placed in the vagina. This is done before your caregiver does a bimanual exam of your internal female organs. This instrument allows your caregiver to see the inside of the vagina and look at the cervix. A small, sterile brush is used to take a sample of cells from the internal opening of the cervix. A small wooden spatula is used to scrape the outside of the cervix. Neither of these two methods to collect cells will cause you pain. These two scrapings are placed on a glass slide or in a small bottle filled with a special liquid. The cells are looked at later under a microscope in a lab. A specialist will look at these cells and determine if the cells are normal. RESULTS OF YOUR PAP TEST  A healthy Pap test shows no abnormal cells or evidence of inflammation.   The presence of abnormally growing cells on the surface of the cervix may be reported as an abnormal Pap test. Different categories of findings are used to describe your Pap test. Your caregiver will go over the importance of these findings with you. The caregiver will then determine what follow-up is needed or when you should have your next pap test.   If you have had two or more abnormal Pap tests:   You may be asked to have a colposcopy. This is a test in which the cervix is viewed with a special lighted microscope.   A cervical tissue sample (biopsy) may also be needed. This involves taking a small tissue sample from the cervix. The sample is looked at under a microscope to find the cause of the abnormal cells. Make sure you find out the results of the Pap test. If you have not received the results within two weeks, contact your caregiver's office for the results. Do not assume everything is normal if you have not heard from  your caregiver or medical facility. It is important to follow up on all of your test results.  Document Released: 07/19/2002 Document Revised: 04/17/2011 Document Reviewed: 04/22/2011 [employer] Patient Information 2012 [employer]. 

## 2011-12-30 NOTE — Progress Notes (Signed)
  Subjective:     [name] is a 61 y.o. woman who comes in today for a  pap smear only. Her most recent annual exam was on 06/23/2011. Her most recent Pap smear was on 06/25/2011 and showed no EC cells. Previous abnormal Pap smears: no. Contraception: none  The following portions of the patient's history were reviewed and updated as appropriate: allergies, current medications, past family history, past medical history, past social history, past surgical history and problem list.  Review of Systems Pertinent items are noted in HPI.   Objective:    BP 124/83  Pulse 97  Temp 98.7 F (37.1 C) (Oral)  Wt 314 lb 3.2 oz (142.52 kg)  SpO2 99% Pelvic Exam: cervix normal in appearance, external genitalia normal and vagina normal without discharge. Pap smear obtained.   Assessment:    Screening pap smear.   Plan:    Follow up in 6 months, or as indicated by Pap results.

## 2011-12-31 ENCOUNTER — Emergency Department (HOSPITAL_COMMUNITY): Payer: Medicare Other

## 2011-12-31 ENCOUNTER — Encounter (HOSPITAL_COMMUNITY): Payer: Self-pay | Admitting: *Deleted

## 2011-12-31 ENCOUNTER — Emergency Department (HOSPITAL_COMMUNITY)
Admission: EM | Admit: 2011-12-31 | Discharge: 2011-12-31 | Disposition: A | Discharge status: Home / Self Care | Payer: Medicare Other | Attending: Emergency Medicine | Admitting: Emergency Medicine

## 2011-12-31 DIAGNOSIS — J189 Pneumonia, unspecified organism: Secondary | ICD-10-CM | POA: Insufficient documentation

## 2011-12-31 DIAGNOSIS — E119 Type 2 diabetes mellitus without complications: Secondary | ICD-10-CM | POA: Insufficient documentation

## 2011-12-31 DIAGNOSIS — N390 Urinary tract infection, site not specified: Secondary | ICD-10-CM

## 2011-12-31 DIAGNOSIS — Z794 Long term (current) use of insulin: Secondary | ICD-10-CM | POA: Insufficient documentation

## 2011-12-31 DIAGNOSIS — R079 Chest pain, unspecified: Secondary | ICD-10-CM | POA: Insufficient documentation

## 2011-12-31 DIAGNOSIS — R0602 Shortness of breath: Secondary | ICD-10-CM | POA: Insufficient documentation

## 2011-12-31 DIAGNOSIS — I10 Essential (primary) hypertension: Secondary | ICD-10-CM | POA: Insufficient documentation

## 2011-12-31 LAB — URINALYSIS, ROUTINE W REFLEX MICROSCOPIC
Glucose, UA: NEGATIVE mg/dL
Ketones, ur: NEGATIVE mg/dL
Protein, ur: NEGATIVE mg/dL
Urobilinogen, UA: 0.2 mg/dL (ref 0.0–1.0)

## 2011-12-31 LAB — CBC
HCT: 33.4 % — ABNORMAL LOW (ref 36.0–46.0)
MCH: 28.5 pg (ref 26.0–34.0)
MCHC: 34.4 g/dL (ref 30.0–36.0)
MCV: 82.7 fL (ref 78.0–100.0)
Platelets: 263 10*3/uL (ref 150–400)
RDW: 13.4 % (ref 11.5–15.5)

## 2011-12-31 LAB — COMPREHENSIVE METABOLIC PANEL
AST: 17 U/L (ref 0–37)
Albumin: 3.4 g/dL — ABNORMAL LOW (ref 3.5–5.2)
BUN: 23 mg/dL (ref 6–23)
Calcium: 9.6 mg/dL (ref 8.4–10.5)
Chloride: 102 mEq/L (ref 96–112)
Creatinine, Ser: 0.99 mg/dL (ref 0.50–1.10)
Total Bilirubin: 0.3 mg/dL (ref 0.3–1.2)
Total Protein: 7.9 g/dL (ref 6.0–8.3)

## 2011-12-31 LAB — MAGNESIUM: Magnesium: 1.7 mg/dL (ref 1.5–2.5)

## 2011-12-31 LAB — PROTIME-INR
INR: 1.13 (ref 0.00–1.49)
Prothrombin Time: 14.7 seconds (ref 11.6–15.2)

## 2011-12-31 LAB — URINE MICROSCOPIC-ADD ON

## 2011-12-31 LAB — POCT I-STAT TROPONIN I: Troponin i, poc: 0 ng/mL (ref 0.00–0.08)

## 2011-12-31 LAB — D-DIMER, QUANTITATIVE: D-Dimer, Quant: 1.12 ug/mL-FEU — ABNORMAL HIGH (ref 0.00–0.48)

## 2011-12-31 MED ORDER — IOHEXOL 350 MG/ML SOLN
100.0000 mL | Freq: Once | INTRAVENOUS | Status: AC | PRN
  Administered 2011-12-31: 100 mL via INTRAVENOUS

## 2011-12-31 MED ORDER — ASPIRIN 81 MG PO CHEW
324.0000 mg | CHEWABLE_TABLET | Freq: Once | ORAL | Status: DC
  Filled 2011-12-31: qty 4

## 2011-12-31 MED ORDER — CEPHALEXIN 500 MG PO CAPS: 500.0000 mg | ORAL_CAPSULE | Freq: Four times a day (QID) | ORAL | Status: AC

## 2011-12-31 MED ORDER — ALBUTEROL SULFATE HFA 108 (90 BASE) MCG/ACT IN AERS: 2.0000 | INHALATION_SPRAY | RESPIRATORY_TRACT | Status: DC | PRN

## 2011-12-31 MED ORDER — DEXTROSE 5 % IV SOLN
1.0000 g | Freq: Once | INTRAVENOUS | Status: AC
  Administered 2011-12-31: 1 g via INTRAVENOUS
  Filled 2011-12-31: qty 10

## 2011-12-31 MED ORDER — SODIUM CHLORIDE 0.9 % IV BOLUS (SEPSIS)
1000.0000 mL | Freq: Once | INTRAVENOUS | Status: AC
  Administered 2011-12-31: 1000 mL via INTRAVENOUS

## 2011-12-31 MED ORDER — SODIUM CHLORIDE 0.9 % IV BOLUS (SEPSIS)
500.0000 mL | Freq: Once | INTRAVENOUS | Status: AC
  Administered 2011-12-31: 500 mL via INTRAVENOUS

## 2011-12-31 MED ORDER — NITROGLYCERIN 0.4 MG SL SUBL
0.4000 mg | SUBLINGUAL_TABLET | SUBLINGUAL | Status: DC | PRN
  Filled 2011-12-31 (×2): qty 75

## 2011-12-31 MED ORDER — ONDANSETRON HCL 4 MG/2ML IJ SOLN
4.0000 mg | Freq: Once | INTRAMUSCULAR | Status: AC
  Administered 2011-12-31: 4 mg via INTRAVENOUS
  Filled 2011-12-31: qty 2

## 2011-12-31 MED ORDER — MORPHINE SULFATE 4 MG/ML IJ SOLN
4.0000 mg | INTRAMUSCULAR | Status: DC | PRN
  Administered 2011-12-31 (×2): 4 mg via INTRAVENOUS
  Filled 2011-12-31 (×2): qty 1

## 2011-12-31 MED ORDER — AZITHROMYCIN 250 MG PO TABS: 250.0000 mg | ORAL_TABLET | Freq: Every day | ORAL | Status: AC

## 2011-12-31 MED ORDER — SODIUM CHLORIDE 0.9 % IV SOLN: 1000.0000 mL | INTRAVENOUS | Status: DC

## 2011-12-31 MED ORDER — DEXTROSE 5 % IV SOLN
500.0000 mg | Freq: Once | INTRAVENOUS | Status: AC
  Administered 2011-12-31: 500 mg via INTRAVENOUS
  Filled 2011-12-31: qty 500

## 2011-12-31 NOTE — ED Notes (Signed)
Iv antibiotic has infused.  Pt ready for d/c

## 2011-12-31 NOTE — ED Notes (Signed)
Pt is here with right under breast pain that radiates to mid chest and patient states it started last nite at 0200.  Pt reports pain increases with deep breath and with movement.  Pt reports sob.  No calf pain or long travel.

## 2011-12-31 NOTE — ED Provider Notes (Addendum)
History     CSN: [id number]  Arrival date & time 12/31/11  4098   First MD Initiated Contact with Patient 12/31/11 [phone]      Chief Complaint  Patient presents with  . Chest Pain  . Shortness of Breath    (Consider location/radiation/quality/duration/timing/severity/associated sxs/prior treatment) The history is limited by the condition of the patient.    61 y/o female INAD c/o right lower chest pain onset at 2AM today while she was awake and in bed. Pain is described as sharp 9/10, non-radiating, pleuric, positional, intermittent. Pt has had multiple similar episodes in the past. Pt took full ASA this AM. Denies N/V ,  RF: DM, HTN, High Cholesterol,  Last stress test ~2 years, cardiac cath ~ 7 yrs ago.   Cards: [place]  Past Medical History  Diagnosis Date  . HYPERLIPIDEMIA 01/11/2009  . DEPRESSION 09/26/2008  . HYPERTENSION 10/06/2006  . DEGENERATIVE JOINT DISEASE 10/06/2006  . Pain in joint, multiple sites 11/10/2006  . INSOMNIA 09/26/2008  . CHEST PAIN 11/18/2007  . Diverticulosis     4782,9562  . Internal hemorrhoids   . OBSTRUCTIVE SLEEP APNEA 06/23/2008    Severe OSA per sleep study 2010, Rx a CPAP  . DIABETES MELLITUS 10/06/2006  . UNSPECIFIED ANEMIA 12/10/2009    Past Surgical History  Procedure Date  . Right knee replacement 2005  . Left knee replacement 07/2007  . Colonoscopy 08/01/2011    Procedure: COLONOSCOPY;  Surgeon: Louis Meckel, MD;  Location: [facility];  Service: Endoscopy;  Laterality: N/A;    Family History  Problem Relation Age of Onset  . Asthma Mother   . Diabetes Mother   . Stroke Mother   . Diabetes Sister   . Hypertension Sister   . Diabetes Brother   . Hypertension Brother   . Pancreatic cancer Brother     History  Substance Use Topics  . Smoking status: Former Smoker -- 0.2 packs/day for 4 years    Types: Cigarettes    Quit date: 05/13/1995  . Smokeless tobacco: Never Used  . Alcohol Use: Yes     Rarely    OB History    Grav Para Term Preterm Abortions TAB SAB Ect Mult Living                  Review of Systems  Constitutional: Negative for fever.  Respiratory: Positive for shortness of breath.   Cardiovascular: Positive for chest pain.  Gastrointestinal: Negative for nausea, vomiting, abdominal pain and diarrhea.  All other systems reviewed and are negative.    Allergies  Review of patient's allergies indicates no known allergies.  Home Medications   Current Outpatient Rx  Name Route Sig Dispense Refill  . ASPIRIN 325 MG PO TABS Oral Take 325 mg by mouth daily.      . BD PEN NEEDLE ULTRAFINE 29G X 12.7MM MISC  use with HUMALOG KWIKPEN 100 each 4  . BENAZEPRIL-HYDROCHLOROTHIAZIDE 20-12.5 MG PO TABS Oral Take 3 tablets by mouth every morning. 270 tablet 1  . ACCU-CHEK AVIVA PLUS W/DEVICE KIT Does not apply 1 Device by Does not apply route once. 1 kit 0  . CARVEDILOL 25 MG PO TABS Oral Take 1 tablet (25 mg total) by mouth 2 (two) times daily. 180 tablet 2  . CLONIDINE HCL 0.2 MG PO TABS Oral Take 1 tablet (0.2 mg total) by mouth 2 (two) times daily. 180 tablet 1  . CRESTOR 40 MG PO TABS  take 1 tablet by mouth  once daily 90 tablet 1  . DILTIAZEM HCL ER BEADS 360 MG PO CP24 Oral Take 1 capsule (360 mg total) by mouth daily. 90 capsule 3  . GLUCOSE BLOOD VI STRP Other 1 each by Other route 2 (two) times daily. And lancets 2/day, 250.03 100 each 12  . INSULIN LISPRO PROT & LISPRO (50-50) 100 UNIT/ML  SUSP Subcutaneous Inject 45 Units into the skin 2 (two) times daily before a meal.     . INSULIN PEN NEEDLE 29G X MISC  Use as directed three times daily 100 each 3  . ACCU-CHEK MULTICLIX LANCETS MISC  Use as instructed twice daily dx 250.03 102 each 5  . METFORMIN HCL 1000 MG PO TABS  take 1 tablet by mouth twice a day with food 180 tablet 0  . MICARDIS 40 MG PO TABS  TAKE 1 TABLET BY MOUTH ONCE DAILY 90 tablet 1  . PANTOPRAZOLE SODIUM 40 MG PO TBEC Oral Take 40 mg by mouth daily as needed.     Marland Kitchen  POTASSIUM CHLORIDE 10 MEQ PO TBCR Oral Take 1 tablet (10 mEq total) by mouth daily. 90 tablet 2    BP 92/68  Pulse 85  Temp 97.7 F (36.5 C) (Oral)  Resp 22  SpO2 97%  Physical Exam  Nursing note and vitals reviewed. Constitutional: She is oriented to person, place, and time. She appears well-developed and well-nourished. No distress.  HENT:  Head: Normocephalic.  Mouth/Throat: Oropharynx is clear and moist.  Eyes: Conjunctivae normal and EOM are normal.  Neck: Normal range of motion. No JVD present.  Cardiovascular: Normal rate, regular rhythm and intact distal pulses.   Pulmonary/Chest: Effort normal and breath sounds normal. No stridor. No respiratory distress. She has no wheezes. She has no rales. She exhibits no tenderness.  Abdominal: Soft. Bowel sounds are normal. She exhibits no distension and no mass. There is no tenderness. There is no rebound and no guarding.  Musculoskeletal: Normal range of motion. She exhibits no edema and no tenderness.  Neurological: She is alert and oriented to person, place, and time.  Skin: Skin is warm.  Psychiatric: She has a normal mood and affect.    ED Course  Procedures (including critical care time)  Labs Reviewed  CBC - Abnormal; Notable for the following:    Hemoglobin 11.5 (*)     HCT 33.4 (*)     All other components within normal limits  COMPREHENSIVE METABOLIC PANEL - Abnormal; Notable for the following:    Glucose, Bld 168 (*)     Albumin 3.4 (*)     GFR calc non Af Amer 60 (*)     GFR calc Af Amer 69 (*)     All other components within normal limits  PRO B NATRIURETIC PEPTIDE  MAGNESIUM  PROTIME-INR  APTT  POCT I-STAT TROPONIN I  URINALYSIS, ROUTINE W REFLEX MICROSCOPIC   Dg Chest Portable 1 View  12/31/2011  *RADIOLOGY REPORT*  Clinical Data: Shortness breath.  Right chest pain.  Hypertension. Diabetes.  PORTABLE CHEST - 1 VIEW  Comparison: 12/08/2007  Findings: Heart size is within normal limits.  Thoracic  spondylosis noted.  Linear opacities right lung base may represent subsegmental atelectasis or early bronchopneumonia.  The left lung appears clear.  IMPRESSION:  1.  Subsegmental atelectasis versus early bronchopneumonia at the right lung base.   Original Report Authenticated By: Dellia Cloud, M.D.      Date: 12/31/2011  Rate: 86  Rhythm: normal sinus rhythm  QRS Axis: normal  Intervals: normal and PR prolonged  ST/T Wave abnormalities: nonspecific ST/T changes  Conduction Disutrbances:none and first-degree A-V block   Narrative Interpretation:   Old EKG Reviewed: changes noted   1. Community acquired pneumonia   2. Urinary tract infection       MDM  dDx ACS Aortic dissection Symptomatic anemia PE CHF Pneumonia Cholecystitis  61 year old diabetic, hypertensive female with high cholesterol presenting with acute onset of right lower anterior chest pain at 2 AM severe at 10 out of 10. Pain is sharp intermittent pleuritic and exacerbated by movement. She reports associated shortness of breath denies nausea vomiting. Pain is not reproducible she has no tenderness to the left upper quadrant   EKG is unremarkable and troponin negative. CXR shows atelectasis versus early pneumonia in the right lung base which correlates with her area of pain. Patient is saturating well on room air. Her curb 65 score is 1 based on BUN and she is eligible for outpatient by mouth treatment for her pneumonia.  UA shows UTI, dual coverage with rocephin IV. I'll discharge her with keflex.   D dimer slightly elevated at 1.12. CTA shows no PE.   Wynetta Emery, PA-C 12/31/11 1638  Tiasha Helvie, PA-C 02/05/12 1529

## 2011-12-31 NOTE — ED Notes (Signed)
MD at bedside. 

## 2012-01-01 LAB — URINE CULTURE

## 2012-01-01 NOTE — ED Provider Notes (Signed)
Medical screening examination/treatment/procedure(s) were performed by non-physician practitioner and as supervising physician I was immediately available for consultation/collaboration.    Ledell Codrington L Keiffer Piper, MD 01/01/12 2327 

## 2012-01-02 ENCOUNTER — Encounter: Payer: Self-pay | Admitting: Endocrinology

## 2012-01-02 ENCOUNTER — Ambulatory Visit (INDEPENDENT_AMBULATORY_CARE_PROVIDER_SITE_OTHER): Payer: Medicare Other | Admitting: Endocrinology

## 2012-01-02 ENCOUNTER — Other Ambulatory Visit (INDEPENDENT_AMBULATORY_CARE_PROVIDER_SITE_OTHER): Payer: Medicare Other

## 2012-01-02 VITALS — BP 114/80 | HR 96 | Temp 98.7°F | Ht 68.0 in | Wt 316.0 lb

## 2012-01-02 DIAGNOSIS — E119 Type 2 diabetes mellitus without complications: Secondary | ICD-10-CM

## 2012-01-02 NOTE — Patient Instructions (Addendum)
Please make a follow-up appointment in 3 months blood tests are being requested for you today.  You will receive a letter with results. pending the test results, please increase the insulin to 47 units 2x a day (with the first and last meals of the day).  However, on Sunday mornings, take only 30 units. On this type of insulin schedule, meals should be eaten on a regular schedule.   check your blood sugar 2 times a day.  vary the time of day when you check, between before the 3 meals, and at bedtime.  also check if you have symptoms of your blood sugar being too high or too low.  please keep a record of the readings and bring it to your next appointment here.  please call us sooner if your blood sugar goes below 70, or if it stays over 200.

## 2012-01-02 NOTE — Progress Notes (Signed)
Subjective:    Patient ID: [name], female    DOB: 1950-05-20, 61 y.o.   MRN: [id number]  HPI pt returns for f/u of type 2 dm (dx'ed 2002; no known complications).  pt states she feels well in general.  she brings a record of her cbg's which i have reviewed today. It varies from 120-250.  There is no trend throughout the day.  She did not increase the insulin as advised.   Past Medical History  Diagnosis Date  . HYPERLIPIDEMIA 01/11/2009  . DEPRESSION 09/26/2008  . HYPERTENSION 10/06/2006  . DEGENERATIVE JOINT DISEASE 10/06/2006  . Pain in joint, multiple sites 11/10/2006  . INSOMNIA 09/26/2008  . CHEST PAIN 11/18/2007  . Diverticulosis     4098,1191  . Internal hemorrhoids   . OBSTRUCTIVE SLEEP APNEA 06/23/2008    Severe OSA per sleep study 2010, Rx a CPAP  . DIABETES MELLITUS 10/06/2006  . UNSPECIFIED ANEMIA 12/10/2009    Past Surgical History  Procedure Date  . Right knee replacement 2005  . Left knee replacement 07/2007  . Colonoscopy 08/01/2011    Procedure: COLONOSCOPY;  Surgeon: Louis Meckel, MD;  Location: [facility];  Service: Endoscopy;  Laterality: N/A;    History   Social History  . Marital Status: Widowed    Spouse Name: N/A    Number of Children: 4  . Years of Education: N/A   Occupational History  .     Social History Main Topics  . Smoking status: Former Smoker -- 0.2 packs/day for 4 years    Types: Cigarettes    Quit date: 05/13/1995  . Smokeless tobacco: Never Used  . Alcohol Use: Yes     Rarely  . Drug Use: No  . Sexually Active: Not on file   Other Topics Concern  . Not on file   Social History Narrative  . No narrative on file    Current Outpatient Prescriptions on File Prior to Visit  Medication Sig Dispense Refill  . albuterol (PROVENTIL HFA;VENTOLIN HFA) 108 (90 BASE) MCG/ACT inhaler Inhale 2 puffs into the lungs every 4 (four) hours as needed for wheezing.  1 Inhaler  0  . aspirin 325 MG tablet Take 325 mg by mouth daily.          Marland Kitchen azithromycin (ZITHROMAX Z-PAK) 250 MG tablet Take 1 tablet (250 mg total) by mouth daily. 500mg  PO day 1, then 250mg  PO days 205  6 tablet  0  . BD ULTRA-FINE PEN NEEDLES 29G X 12.7MM MISC use with HUMALOG KWIKPEN  100 each  4  . benazepril-hydrochlorthiazide (LOTENSIN HCT) 20-12.5 MG per tablet Take 2 tablets by mouth every morning.      . Blood Glucose Monitoring Suppl (ACCU-CHEK AVIVA PLUS) W/DEVICE KIT 1 Device by Does not apply route once.  1 kit  0  . carvedilol (COREG) 25 MG tablet Take 1 tablet (25 mg total) by mouth 2 (two) times daily.  180 tablet  2  . cephALEXin (KEFLEX) 500 MG capsule Take 1 capsule (500 mg total) by mouth 4 (four) times daily.  20 capsule  0  . cloNIDine (CATAPRES) 0.2 MG tablet Take 1 tablet (0.2 mg total) by mouth 2 (two) times daily.  180 tablet  1  . CRESTOR 40 MG tablet take 1 tablet by mouth once daily  90 tablet  1  . diltiazem (TIAZAC) 360 MG 24 hr capsule Take 360 mg by mouth daily.      Marland Kitchen glucose blood (ACCU-CHEK  AVIVA) test strip 1 each by Other route 2 (two) times daily. And lancets 2/day, 250.03  100 each  12  . insulin lispro protamine-insulin lispro (HUMALOG MIX 50/50 KWIKPEN) (50-50) 100 UNIT/ML SUSP Inject 47 Units into the skin 2 (two) times daily before a meal.       . Insulin Pen Needle 29G X MISC Use as directed three times daily  100 each  3  . Lancets (ACCU-CHEK MULTICLIX) lancets Use as instructed twice daily dx 250.03  102 each  5  . metFORMIN (GLUCOPHAGE) 1000 MG tablet take 1 tablet by mouth twice a day with food  180 tablet  0  . potassium chloride (KLOR-CON) 10 MEQ CR tablet Take 1 tablet (10 mEq total) by mouth daily.  90 tablet  2  . telmisartan (MICARDIS) 40 MG tablet Take 40 mg by mouth daily.        No Known Allergies  Family History  Problem Relation Age of Onset  . Asthma Mother   . Diabetes Mother   . Stroke Mother   . Diabetes Sister   . Hypertension Sister   . Diabetes Brother   . Hypertension Brother   .  Pancreatic cancer Brother     BP 114/80  Pulse 96  Temp 98.7 F (37.1 C) (Oral)  Ht 5\' 8"  (1.727 m)  Wt 316 lb (143.337 kg)  BMI 48.05 kg/m2  SpO2 95%    Review of Systems She has hypoglycemia on Sunday afternoons only    Objective:   Physical Exam \\VITAL  SIGNS:  See vs page GENERAL: no distress NECK: There is no palpable thyroid enlargement.  No thyroid nodule is palpable.  No palpable lymphadenopathy at the anterior neck. (sees podiatry)  Lab Results  Component Value Date   HGBA1C 8.1* 01/02/2012      Assessment & Plan:  DM: therapy limited by noncompliance.  i'll do the best i can.

## 2012-01-06 ENCOUNTER — Telehealth: Payer: Self-pay | Admitting: *Deleted

## 2012-01-06 NOTE — Telephone Encounter (Signed)
Called pt to inform of lab results, pt informed (letter also mailed to pt). 

## 2012-01-08 ENCOUNTER — Telehealth: Payer: Self-pay | Admitting: *Deleted

## 2012-01-08 NOTE — Telephone Encounter (Signed)
Called to check on pt & she states that she is feeling much better. I offered her to schedule a follow-up appt with Dr. Drue Novel but she declined.

## 2012-02-05 NOTE — ED Provider Notes (Signed)
Medical screening examination/treatment/procedure(s) were performed by non-physician practitioner and as supervising physician I was immediately available for consultation/collaboration.    Nelia Shi, MD 02/05/12 [phone]

## 2012-02-10 ENCOUNTER — Other Ambulatory Visit: Payer: Self-pay | Admitting: Internal Medicine

## 2012-02-12 NOTE — Telephone Encounter (Signed)
Refill done.  

## 2012-03-22 ENCOUNTER — Other Ambulatory Visit: Payer: Self-pay | Admitting: Endocrinology

## 2012-03-27 ENCOUNTER — Other Ambulatory Visit: Payer: Self-pay | Admitting: Internal Medicine

## 2012-03-29 NOTE — Telephone Encounter (Signed)
Refill done.  

## 2012-04-01 ENCOUNTER — Ambulatory Visit (INDEPENDENT_AMBULATORY_CARE_PROVIDER_SITE_OTHER): Payer: Medicare Other | Admitting: Endocrinology

## 2012-04-01 ENCOUNTER — Encounter: Payer: Self-pay | Admitting: Endocrinology

## 2012-04-01 VITALS — BP 142/80 | HR 93 | Temp 98.0°F | Wt 315.0 lb

## 2012-04-01 DIAGNOSIS — E119 Type 2 diabetes mellitus without complications: Secondary | ICD-10-CM

## 2012-04-01 LAB — HEMOGLOBIN A1C
Hgb A1c MFr Bld: 7.3 % — ABNORMAL HIGH (ref <5.7)
Mean Plasma Glucose: 163 mg/dL — ABNORMAL HIGH (ref <117)

## 2012-04-01 NOTE — Progress Notes (Signed)
Subjective:    Patient ID: [name], female    DOB: 29-Oct-1950, 61 y.o.   MRN: [id number]  HPI pt returns for f/u of type 2 dm (dx'ed 2002; complicated by peripheral sensory neuropathy; she has done better with the simpler BID insulin regimen).  pt states she feels well in general.  she brings a record of her cbg's which i have reviewed today. It varies from 98-200's.  There is no trend throughout the day.   Past Medical History  Diagnosis Date  . HYPERLIPIDEMIA 01/11/2009  . DEPRESSION 09/26/2008  . HYPERTENSION 10/06/2006  . DEGENERATIVE JOINT DISEASE 10/06/2006  . Pain in joint, multiple sites 11/10/2006  . INSOMNIA 09/26/2008  . CHEST PAIN 11/18/2007  . Diverticulosis     4098,1191  . Internal hemorrhoids   . OBSTRUCTIVE SLEEP APNEA 06/23/2008    Severe OSA per sleep study 2010, Rx a CPAP  . DIABETES MELLITUS 10/06/2006  . UNSPECIFIED ANEMIA 12/10/2009    Past Surgical History  Procedure Date  . Right knee replacement 2005  . Left knee replacement 07/2007  . Colonoscopy 08/01/2011    Procedure: COLONOSCOPY;  Surgeon: Louis Meckel, MD;  Location: [facility];  Service: Endoscopy;  Laterality: N/A;    History   Social History  . Marital Status: Widowed    Spouse Name: N/A    Number of Children: 4  . Years of Education: N/A   Occupational History  .     Social History Main Topics  . Smoking status: Former Smoker -- 0.2 packs/day for 4 years    Types: Cigarettes    Quit date: 05/13/1995  . Smokeless tobacco: Never Used  . Alcohol Use: Yes     Comment: Rarely  . Drug Use: No  . Sexually Active: Not on file   Other Topics Concern  . Not on file   Social History Narrative  . No narrative on file    Current Outpatient Prescriptions on File Prior to Visit  Medication Sig Dispense Refill  . albuterol (PROVENTIL HFA;VENTOLIN HFA) 108 (90 BASE) MCG/ACT inhaler Inhale 2 puffs into the lungs every 4 (four) hours as needed for wheezing.  1 Inhaler  0  . aspirin 325  MG tablet Take 325 mg by mouth daily.        . BD ULTRA-FINE PEN NEEDLES 29G X 12.7MM MISC use with HUMALOG KWIKPEN  100 each  4  . benazepril-hydrochlorthiazide (LOTENSIN HCT) 20-12.5 MG per tablet Take 2 tablets by mouth every morning.      . Blood Glucose Monitoring Suppl (ACCU-CHEK AVIVA PLUS) W/DEVICE KIT 1 Device by Does not apply route once.  1 kit  0  . carvedilol (COREG) 25 MG tablet take 1 tablet by mouth twice a day  180 tablet  1  . cloNIDine (CATAPRES) 0.2 MG tablet take 1 tablet by mouth twice a day  180 tablet  1  . CRESTOR 40 MG tablet take 1 tablet by mouth once daily  90 tablet  1  . diltiazem (TIAZAC) 360 MG 24 hr capsule Take 360 mg by mouth daily.      Marland Kitchen glucose blood (ACCU-CHEK AVIVA) test strip 1 each by Other route 2 (two) times daily. And lancets 2/day, 250.03  100 each  12  . insulin lispro protamine-insulin lispro (HUMALOG MIX 50/50 KWIKPEN) (50-50) 100 UNIT/ML SUSP Inject 47 Units into the skin 2 (two) times daily before a meal.       . Insulin Pen Needle 29G X  MISC Use as directed three times daily  100 each  3  . KLOR-CON M10 10 MEQ tablet take 1 tablet by mouth once daily  90 each  0  . Lancets (ACCU-CHEK MULTICLIX) lancets Use as instructed twice daily dx 250.03  102 each  5  . metFORMIN (GLUCOPHAGE) 1000 MG tablet take 1 tablet by mouth twice a day with food  180 tablet  1  . MICARDIS 40 MG tablet take 1 tablet by mouth once daily  90 each  0  . potassium chloride (KLOR-CON) 10 MEQ CR tablet Take 1 tablet (10 mEq total) by mouth daily.  90 tablet  2  . telmisartan (MICARDIS) 40 MG tablet Take 40 mg by mouth daily.      . [DISCONTINUED] HUMALOG MIX 50/50 KWIKPEN (50-50) 100 UNIT/ML SUSP INJECT 45 UNITS SUBCUTANEOUSLY 2 TIMES DAILY  3 mL  4    No Known Allergies  Family History  Problem Relation Age of Onset  . Asthma Mother   . Diabetes Mother   . Stroke Mother   . Diabetes Sister   . Hypertension Sister   . Diabetes Brother   . Hypertension Brother    . Pancreatic cancer Brother     BP 142/80  Pulse 93  Temp 98 F (36.7 C) (Oral)  Wt 315 lb (142.883 kg)  SpO2 97%   Review of Systems denies hypoglycemia.    Objective:   Physical Exam VITAL SIGNS:  See vs page.   GENERAL: no distress. Pulses: dorsalis pedis intact bilat.   Feet: no deformity.  There is a bony prominence at the dorsal aspect of the right foot.  no ulcer on the feet.  feet are of normal color and temp.  1+ bilat leg edema.  There is bilateral onychomycosis, and varicosities.   Neuro: sensation is intact to touch on the feet, but slightly decreased from normal.       Assessment & Plan:  DM, therapy limited by pt's need for a simple regimen.

## 2012-04-01 NOTE — Patient Instructions (Addendum)
Please make a follow-up appointment in 3 months blood tests are being requested for you today.  We'll contact you with results.   pending the test results, please continue 47 units 2x a day (with the first and last meals of the day).  However, on Sunday mornings, take only 30 units. On this type of insulin schedule, meals should be eaten on a regular schedule.   check your blood sugar 2 times a day.  vary the time of day when you check, between before the 3 meals, and at bedtime.  also check if you have symptoms of your blood sugar being too high or too low.  please keep a record of the readings and bring it to your next appointment here.  please call us sooner if your blood sugar goes below 70, or if it stays over 200.

## 2012-05-04 ENCOUNTER — Emergency Department (HOSPITAL_COMMUNITY): Payer: PRIVATE HEALTH INSURANCE

## 2012-05-04 ENCOUNTER — Encounter (HOSPITAL_COMMUNITY): Payer: Self-pay | Admitting: Emergency Medicine

## 2012-05-04 ENCOUNTER — Emergency Department (HOSPITAL_COMMUNITY)
Admission: EM | Admit: 2012-05-04 | Discharge: 2012-05-04 | Disposition: A | Discharge status: Home / Self Care | Payer: PRIVATE HEALTH INSURANCE | Attending: Emergency Medicine | Admitting: Emergency Medicine

## 2012-05-04 DIAGNOSIS — R040 Epistaxis: Secondary | ICD-10-CM

## 2012-05-04 DIAGNOSIS — Z87891 Personal history of nicotine dependence: Secondary | ICD-10-CM | POA: Insufficient documentation

## 2012-05-04 DIAGNOSIS — Z8739 Personal history of other diseases of the musculoskeletal system and connective tissue: Secondary | ICD-10-CM | POA: Insufficient documentation

## 2012-05-04 DIAGNOSIS — E119 Type 2 diabetes mellitus without complications: Secondary | ICD-10-CM | POA: Insufficient documentation

## 2012-05-04 DIAGNOSIS — Z862 Personal history of diseases of the blood and blood-forming organs and certain disorders involving the immune mechanism: Secondary | ICD-10-CM | POA: Insufficient documentation

## 2012-05-04 DIAGNOSIS — E785 Hyperlipidemia, unspecified: Secondary | ICD-10-CM | POA: Insufficient documentation

## 2012-05-04 DIAGNOSIS — G4733 Obstructive sleep apnea (adult) (pediatric): Secondary | ICD-10-CM | POA: Insufficient documentation

## 2012-05-04 DIAGNOSIS — M7989 Other specified soft tissue disorders: Secondary | ICD-10-CM | POA: Insufficient documentation

## 2012-05-04 DIAGNOSIS — I10 Essential (primary) hypertension: Secondary | ICD-10-CM

## 2012-05-04 DIAGNOSIS — R51 Headache: Secondary | ICD-10-CM | POA: Insufficient documentation

## 2012-05-04 DIAGNOSIS — Z8719 Personal history of other diseases of the digestive system: Secondary | ICD-10-CM | POA: Insufficient documentation

## 2012-05-04 DIAGNOSIS — Z79899 Other long term (current) drug therapy: Secondary | ICD-10-CM | POA: Insufficient documentation

## 2012-05-04 DIAGNOSIS — Z8659 Personal history of other mental and behavioral disorders: Secondary | ICD-10-CM | POA: Insufficient documentation

## 2012-05-04 DIAGNOSIS — Z794 Long term (current) use of insulin: Secondary | ICD-10-CM | POA: Insufficient documentation

## 2012-05-04 DIAGNOSIS — R0602 Shortness of breath: Secondary | ICD-10-CM | POA: Insufficient documentation

## 2012-05-04 LAB — BASIC METABOLIC PANEL
CO2: 26 mEq/L (ref 19–32)
Chloride: 101 mEq/L (ref 96–112)
Glucose, Bld: 231 mg/dL — ABNORMAL HIGH (ref 70–99)
Potassium: 3.7 mEq/L (ref 3.5–5.1)
Sodium: 138 mEq/L (ref 135–145)

## 2012-05-04 LAB — CBC
Hemoglobin: 12.7 g/dL (ref 12.0–15.0)
Platelets: 263 10*3/uL (ref 150–400)
RBC: 4.46 MIL/uL (ref 3.87–5.11)
WBC: 7.1 10*3/uL (ref 4.0–10.5)

## 2012-05-04 NOTE — ED Notes (Signed)
Pt c/o nose bleed from right nare this am; pt noted to be hypertensive; no bleeding noted at present

## 2012-05-04 NOTE — ED Notes (Signed)
Assumed care of pt. Pt resting comfortably in bed. Denies any complaints/ pain at this time. Monitors attached. Will continue to monitor

## 2012-05-04 NOTE — ED Provider Notes (Signed)
Medical screening examination/treatment/procedure(s) were performed by non-physician practitioner and as supervising physician I was immediately available for consultation/collaboration.   Shelda Jakes, MD 05/04/12 1215

## 2012-05-04 NOTE — ED Provider Notes (Signed)
History     CSN: [id number]  Arrival date & time 05/04/12  5621   First MD Initiated Contact with Patient 05/04/12 [phone]      Chief Complaint  Patient presents with  . Epistaxis  . Hypertension    (Consider location/radiation/quality/duration/timing/severity/associated sxs/prior treatment) HPI Comments: 61 year old morbidly obese female presents to the emergency department complaining of hemoptysis followed by leading from her nose on the right side at 5:00 this morning. States she only had that one episode of coughing up blood in the nosebleed lasted 20 minutes after applying pressure. She's not had any bleeding since. Her blood pressure is very high in triage, patient denies taking her medications this morning since she she knew she was coming in to the emergency department. Admits to having a headache yesterday. Denies chest pain, but states she is always short of breath. Her legs are always swollen. Denies dizziness, lightheadedness, blurred vision, nausea or vomiting. No trauma to her nose.  Patient is a 61 y.o. female presenting with nosebleeds and hypertension. The history is provided by the patient.  Epistaxis   Hypertension Associated symptoms include headaches. Pertinent negatives include no chest pain, chills, congestion, fatigue, fever, nausea, vomiting or weakness.    Past Medical History  Diagnosis Date  . HYPERLIPIDEMIA 01/11/2009  . DEPRESSION 09/26/2008  . HYPERTENSION 10/06/2006  . DEGENERATIVE JOINT DISEASE 10/06/2006  . Pain in joint, multiple sites 11/10/2006  . INSOMNIA 09/26/2008  . CHEST PAIN 11/18/2007  . Diverticulosis     5784,6962  . Internal hemorrhoids   . OBSTRUCTIVE SLEEP APNEA 06/23/2008    Severe OSA per sleep study 2010, Rx a CPAP  . DIABETES MELLITUS 10/06/2006  . UNSPECIFIED ANEMIA 12/10/2009    Past Surgical History  Procedure Date  . Right knee replacement 2005  . Left knee replacement 07/2007  . Colonoscopy 08/01/2011    Procedure: COLONOSCOPY;   Surgeon: Louis Meckel, MD;  Location: [facility];  Service: Endoscopy;  Laterality: N/A;    Family History  Problem Relation Age of Onset  . Asthma Mother   . Diabetes Mother   . Stroke Mother   . Diabetes Sister   . Hypertension Sister   . Diabetes Brother   . Hypertension Brother   . Pancreatic cancer Brother     History  Substance Use Topics  . Smoking status: Former Smoker -- 0.2 packs/day for 4 years    Types: Cigarettes    Quit date: 05/13/1995  . Smokeless tobacco: Never Used  . Alcohol Use: Yes     Comment: Rarely    OB History    Grav Para Term Preterm Abortions TAB SAB Ect Mult Living                  Review of Systems  Constitutional: Negative for fever, chills, activity change and fatigue.  HENT: Positive for nosebleeds. Negative for congestion.   Eyes: Negative for visual disturbance.  Respiratory: Positive for shortness of breath.   Cardiovascular: Positive for leg swelling. Negative for chest pain.  Gastrointestinal: Negative for nausea and vomiting.  Musculoskeletal: Negative.   Skin: Negative for color change.  Neurological: Positive for headaches. Negative for dizziness, weakness and light-headedness.  Psychiatric/Behavioral: Negative.     Allergies  Review of patient's allergies indicates no known allergies.  Home Medications   Current Outpatient Rx  Name  Route  Sig  Dispense  Refill  . ALBUTEROL SULFATE HFA 108 (90 BASE) MCG/ACT IN AERS   Inhalation   Inhale  2 puffs into the lungs every 4 (four) hours as needed for wheezing.   1 Inhaler   0   . ASPIRIN 325 MG PO TABS   Oral   Take 325 mg by mouth daily.           . BD PEN NEEDLE ULTRAFINE 29G X 12.7MM MISC      use with HUMALOG KWIKPEN   100 each   4   . BENAZEPRIL-HYDROCHLOROTHIAZIDE 20-12.5 MG PO TABS   Oral   Take 2 tablets by mouth every morning.         Marland Kitchen ACCU-CHEK AVIVA PLUS W/DEVICE KIT   Does not apply   1 Device by Does not apply route once.   1 kit    0   . CARVEDILOL 25 MG PO TABS      take 1 tablet by mouth twice a day   180 tablet   1   . CLONIDINE HCL 0.2 MG PO TABS      take 1 tablet by mouth twice a day   180 tablet   1   . CRESTOR 40 MG PO TABS      take 1 tablet by mouth once daily   90 tablet   1   . DILTIAZEM HCL ER BEADS 360 MG PO CP24   Oral   Take 360 mg by mouth daily.         Marland Kitchen GLUCOSE BLOOD VI STRP   Other   1 each by Other route 2 (two) times daily. And lancets 2/day, 250.03   100 each   12   . INSULIN LISPRO PROT & LISPRO (50-50) 100 UNIT/ML Shindler SUSP   Subcutaneous   Inject 47 Units into the skin 2 (two) times daily before a meal.          . INSULIN PEN NEEDLE 29G X MISC      Use as directed three times daily   100 each   3   . KLOR-CON M10 10 MEQ PO TBCR      take 1 tablet by mouth once daily   90 each   0   . ACCU-CHEK MULTICLIX LANCETS MISC      Use as instructed twice daily dx 250.03   102 each   5   . METFORMIN HCL 1000 MG PO TABS      take 1 tablet by mouth twice a day with food   180 tablet   1   . MICARDIS 40 MG PO TABS      take 1 tablet by mouth once daily   90 each   0   . TELMISARTAN 40 MG PO TABS   Oral   Take 40 mg by mouth daily.           BP 233/136  Pulse 103  Temp 98.7 F (37.1 C) (Oral)  Resp 22  SpO2 97%  Physical Exam  Nursing note and vitals reviewed. Constitutional: She is oriented to person, place, and time. She appears well-developed. No distress.       Morbidly obese  HENT:  Head: Normocephalic and atraumatic.  Nose: No rhinorrhea. No epistaxis.       Superficial blood vessels noted in right nare. No active bleeding.  Eyes: Conjunctivae normal and EOM are normal. Pupils are equal, round, and reactive to light.  Neck: Normal range of motion. Neck supple.  Cardiovascular: Normal rate, regular rhythm, normal heart sounds and intact distal pulses.        +  2 pitting edema LE bilateral  Pulmonary/Chest: Effort normal and breath  sounds normal. She has no rales.  Abdominal: Soft. Bowel sounds are normal. There is no tenderness.  Musculoskeletal: Normal range of motion.  Neurological: She is alert and oriented to person, place, and time.  Skin: Skin is warm and dry. She is not diaphoretic.  Psychiatric: She has a normal mood and affect. Her behavior is normal.    ED Course  Procedures (including critical care time)  Labs Reviewed  GLUCOSE, CAPILLARY - Abnormal; Notable for the following:    Glucose-Capillary 230 (*)     All other components within normal limits  BASIC METABOLIC PANEL - Abnormal; Notable for the following:    Glucose, Bld 231 (*)     GFR calc non Af Amer 89 (*)     All other components within normal limits  CBC   Dg Chest 2 View  05/04/2012  *RADIOLOGY REPORT*  Clinical Data: Epistaxis, hypertension, headache  CHEST - 2 VIEW  Comparison: 12/31/2011  Findings: Normal heart size and vascularity.  No focal pneumonia, collapse, consolidation, edema, effusion, pneumothorax.  Trachea midline.  Degenerative changes of the spine and shoulders.  IMPRESSION: No acute chest finding, stable exam   Original Report Authenticated By: Judie Petit. Miles Costain, M.D.     Date: 05/04/2012  Rate: 93  Rhythm: normal sinus rhythm  QRS Axis: normal  Intervals: normal  ST/T Wave abnormalities: normal  Conduction Disutrbances:none  Narrative Interpretation: normal EKG  Old EKG Reviewed: unchanged    1. Epistaxis   2. Hypertension       MDM  61 year old female with epistaxis and hypertension. She has not had a nosebleed since being in the emergency department. No chest pain, headaches, dizziness or visual disturbance concerning patient's hypertension. In the emergency department her blood pressure at one point was 233/136, after laying for a while went down to 174/100. Patient has not taken any of her medications yet today. Advised patient to take all of her medications as prescribed. EKG, labs and chest x-ray unremarkable.  She will followup with her primary care physician.        Trevor Mace, PA-C 05/04/12 1209

## 2012-05-13 ENCOUNTER — Other Ambulatory Visit: Payer: Self-pay | Admitting: Internal Medicine

## 2012-05-14 NOTE — Telephone Encounter (Signed)
Refill done.  

## 2012-05-26 ENCOUNTER — Ambulatory Visit (INDEPENDENT_AMBULATORY_CARE_PROVIDER_SITE_OTHER): Payer: 59 | Admitting: Endocrinology

## 2012-05-26 VITALS — BP 132/70 | Temp 98.8°F | Wt 316.0 lb

## 2012-05-26 DIAGNOSIS — E119 Type 2 diabetes mellitus without complications: Secondary | ICD-10-CM

## 2012-05-26 NOTE — Patient Instructions (Addendum)
Please make a follow-up appointment in 6 weeks.   On this type of insulin, it it very important for you to eat your meals on time, especially lunch.   pending the test results, please continue 47 units 2x a day (with the first and last meals of the day).  However, on Sunday mornings, take only 25 units. On this type of insulin schedule, meals should be eaten on a regular schedule.   check your blood sugar 2 times a day.  vary the time of day when you check, between before the 3 meals, and at bedtime.  also check if you have symptoms of your blood sugar being too high or too low.  please keep a record of the readings and bring it to your next appointment here.  please call us sooner if your blood sugar goes below 70, or if it stays over 200.

## 2012-05-26 NOTE — Progress Notes (Signed)
Subjective:    Patient ID: [name], female    DOB: December 17, 1950, 62 y.o.   MRN: [id number]  HPI pt returns for f/u of type 2 dm (dx'ed 2002; complicated by peripheral sensory neuropathy; she has done better with the simpler BID insulin regimen).  pt states she feels well in general.  she brings a record of her cbg's which i have reviewed today. It varies from 100-290, but most are in the mid to high-100's.  There is no trend throughout the day.  She says cbg might have been low today (lunch was delayed), but she is not sure.  She also says that she has mild hypoglycemia in the middle of the day on sundays, even if she takes just 30 units that am. Past Medical History  Diagnosis Date  . HYPERLIPIDEMIA 01/11/2009  . DEPRESSION 09/26/2008  . HYPERTENSION 10/06/2006  . DEGENERATIVE JOINT DISEASE 10/06/2006  . Pain in joint, multiple sites 11/10/2006  . INSOMNIA 09/26/2008  . CHEST PAIN 11/18/2007  . Diverticulosis     20 08,2005  . Internal hemorrhoids   . OBSTRUCTIVE SLEEP APNEA 06/23/2008    Severe OSA per sleep study 2010, Rx a CPAP  . DIABETES MELLITUS 10/06/2006  . UNSPECIFIED ANEMIA 12/10/2009    Past Surgical History  Procedure Date  . Right knee replacement 2005  . Left knee replacement 07/2007  . Colonoscopy 08/01/2011    Procedure: COLONOSCOPY;  Surgeon: Louis Meckel, MD;  Location: [facility];  Service: Endoscopy;  Laterality: N/A;    History   Social History  . Marital Status: Widowed    Spouse Name: N/A    Number of Children: 4  . Years of Education: N/A   Occupational History  .     Social History Main Topics  . Smoking status: Former Smoker -- 0.2 packs/day for 4 years    Types: Cigarettes    Quit date: 05/13/1995  . Smokeless tobacco: Never Used  . Alcohol Use: Yes     Comment: Rarely  . Drug Use: No  . Sexually Active: Not on file   Other Topics Concern  . Not on file   Social History Narrative  . No narrative on file    Current Outpatient  Prescriptions on File Prior to Visit  Medication Sig Dispense Refill  . albuterol (PROVENTIL HFA;VENTOLIN HFA) 108 (90 BASE) MCG/ACT inhaler Inhale 2 puffs into the lungs every 4 (four) hours as needed for wheezing.  1 Inhaler  0  . aspirin 325 MG tablet Take 325 mg by mouth daily.        . BD ULTRA-FINE PEN NEEDLES 29G X 12.7MM MISC use with HUMALOG KWIKPEN  100 each  4  . Blood Glucose Monitoring Suppl (ACCU-CHEK AVIVA PLUS) W/DEVICE KIT 1 Device by Does not apply route once.  1 kit  0  . carvedilol (COREG) 25 MG tablet take 1 tablet by mouth twice a day  180 tablet  1  . cloNIDine (CATAPRES) 0.2 MG tablet take 1 tablet by mouth twice a day  180 tablet  1  . CRESTOR 40 MG tablet take 1 tablet by mouth once daily  90 tablet  1  . diltiazem (TIAZAC) 360 MG 24 hr capsule Take 360 mg by mouth daily.      Marland Kitchen glucose blood (ACCU-CHEK AVIVA) test strip 1 each by Other route 2 (two) times daily. And lancets 2/day, 250.03  100 each  12  . insulin lispro protamine-insulin lispro (HUMALOG MIX 50/50 KWIKPEN) (  50-50) 100 UNIT/ML SUSP Inject 47 Units into the skin 2 (two) times daily before a meal.       . Insulin Pen Needle 29G X MISC Use as directed three times daily  100 each  3  . KLOR-CON M10 10 MEQ tablet TAKE 1 TABLET BY MOUTH ONCE DAILY  90 each  0  . Lancets (ACCU-CHEK MULTICLIX) lancets Use as instructed twice daily dx 250.03  102 each  5  . metFORMIN (GLUCOPHAGE) 1000 MG tablet take 1 tablet by mouth twice a day with food  180 tablet  1  . MICARDIS 40 MG tablet take 1 tablet by mouth once daily  90 each  0  . budesonide-formoterol (SYMBICORT) 160-4.5 MCG/ACT inhaler Inhale 2 puffs into the lungs 2 (two) times daily.  1 Inhaler  3  . omeprazole (PRILOSEC) 20 MG capsule Take 1 capsule (20 mg total) by mouth daily.  30 capsule  3    No Known Allergies  Family History  Problem Relation Age of Onset  . Asthma Mother   . Diabetes Mother   . Stroke Mother   . Diabetes Sister   . Hypertension  Sister   . Diabetes Brother   . Hypertension Brother   . Pancreatic cancer Brother     BP 132/70  Temp 98.8 F (37.1 C) (Oral)  Wt 316 lb (143.337 kg)  SpO2 96%  Review of Systems Denies LOC    Objective:   Physical Exam VITAL SIGNS:  See vs page GENERAL: no distress SKIN:  Insulin injection sites at the anterior abdomen are normal.        Assessment & Plan:  DM: she may need increased rx, but i need next a1c to be more certain

## 2012-05-28 ENCOUNTER — Ambulatory Visit (INDEPENDENT_AMBULATORY_CARE_PROVIDER_SITE_OTHER): Payer: PRIVATE HEALTH INSURANCE | Admitting: Internal Medicine

## 2012-05-28 VITALS — BP 112/66 | HR 80 | Temp 97.9°F | Wt 315.0 lb

## 2012-05-28 DIAGNOSIS — J9801 Acute bronchospasm: Secondary | ICD-10-CM

## 2012-05-28 DIAGNOSIS — Z23 Encounter for immunization: Secondary | ICD-10-CM

## 2012-05-28 DIAGNOSIS — I10 Essential (primary) hypertension: Secondary | ICD-10-CM

## 2012-05-28 DIAGNOSIS — Z0000 Encounter for general adult medical examination without abnormal findings: Secondary | ICD-10-CM

## 2012-05-28 DIAGNOSIS — E785 Hyperlipidemia, unspecified: Secondary | ICD-10-CM

## 2012-05-28 HISTORY — DX: Acute bronchospasm: J98.01

## 2012-05-28 MED ORDER — BUDESONIDE-FORMOTEROL FUMARATE 160-4.5 MCG/ACT IN AERO: 2.0000 | INHALATION_SPRAY | Freq: Two times a day (BID) | RESPIRATORY_TRACT | Status: DC

## 2012-05-28 MED ORDER — OMEPRAZOLE 20 MG PO CPDR: 20.0000 mg | DELAYED_RELEASE_CAPSULE | Freq: Every day | ORAL | Status: DC

## 2012-05-28 NOTE — Assessment & Plan Note (Signed)
Tdap today Pneumonia shot today

## 2012-05-28 NOTE — Patient Instructions (Addendum)
Start Symbicort 2 puffs twice a day every day. Use albuterol only if you have chest "rattling" in the chest Start omeprazole one every morning before breakfast Come back in 3-4 weeks for a recheck.

## 2012-05-28 NOTE — Assessment & Plan Note (Addendum)
Reports of chest congestion, wheezing" since 12-2011 when she went to the ER and thought to have pneumonia. At this point she is using albuterol several times a day. No history of asthma, she is a remote smoker. Does not have symptoms that suggest cardiac asthma Plan: Add symbicort Use albuterol only as needed Also, she complains of some heartburn, will start omeprazole Reassess in 3 weeks

## 2012-05-28 NOTE — Assessment & Plan Note (Signed)
BP elevated this month at the ER, BP today is very good. She reports good compliance with medication but she is now taking Lotensin HCT Plan: No change

## 2012-05-28 NOTE — Progress Notes (Signed)
  Subjective:    Patient ID: [name], female    DOB: [birth year], 62 y.o.   MRN: [id number]  HPI Routine followup Patient was seen in the ER 05/04/2012 with nosebleeds and the elevated BP. BP was 174/100, EKG showed no acute changes. No further nosebleeds. Hypertension, see above, medication list reviewed, reports she's not taking lotensinHCT. Reports she's having on and off chest congestion/wheezing since she was diagnosed with pneumonia 12-2011. Reports that is using albuterol 3 or 4 times a day otherwise she gets wheezy. ER records reviewed, she was seen at the ER 12-2011 w/  chest pain shortness of breath, d-dimer was positive, chest x-ray showed possible right lower lobe pneumonia. CT showed no PE and actually no pneumonia.  Past Medical History:  DIABETES MELLITUS  HYPERTENSION  Severe OSA per sleep study 2010, Rx a CPAP  DJD, pain in multiple joints  2006 Cscope --TICs  Depression (Dx 09-2008)  CHEST PAIN:  2004--Cath neg, arterial LE dopplers neg  06-2003-- abd. u/s fatty liver  01-2005-- CP...neg u/s GB, neg HIDA  10-06--saw cards--cardiolite neg  CP--abnormal stress test, Cath 9-09: normal   Past Surgical History:  2005 -R- Knne replacecement  3-09 TKR left  Social History:  Widow, 4 kids  tobacco-- no  ETOH-- rarely  not working, on disability  exercising a little more    Review of Systems Currently he denies any chest pain or shortness of breath, has occasional wheezing, mostly at night. Denied orthopnea, minimal lower extremity edema at the end of the day.     Objective:   Physical Exam General -- alert, well-developed, NAD Lungs -- normal respiratory effort, no intercostal retractions, no accessory muscle use, and normal breath sounds.   Heart-- normal rate, regular rhythm, no murmur, and no gallop.     Extremities-- no pretibial edema bilaterally Psych-- Cognition and judgment appear intact. Alert and cooperative with normal attention span and  concentration.  not anxious appearing and not depressed appearing.      Assessment & Plan:  Recent labs available in the system reviewed

## 2012-05-28 NOTE — Assessment & Plan Note (Signed)
Well-controlled at this point 

## 2012-05-30 ENCOUNTER — Encounter: Payer: Self-pay | Admitting: Internal Medicine

## 2012-06-05 ENCOUNTER — Other Ambulatory Visit: Payer: Self-pay | Admitting: Internal Medicine

## 2012-06-07 ENCOUNTER — Ambulatory Visit: Payer: Medicare Other | Admitting: Internal Medicine

## 2012-06-07 NOTE — Telephone Encounter (Signed)
Refill done.  

## 2012-06-25 ENCOUNTER — Ambulatory Visit: Payer: 59 | Admitting: Internal Medicine

## 2012-07-01 ENCOUNTER — Other Ambulatory Visit: Payer: Self-pay | Admitting: Endocrinology

## 2012-07-01 ENCOUNTER — Other Ambulatory Visit: Payer: Self-pay | Admitting: Internal Medicine

## 2012-07-02 ENCOUNTER — Ambulatory Visit: Payer: Medicare Other | Admitting: Internal Medicine

## 2012-07-02 ENCOUNTER — Ambulatory Visit: Payer: Medicare Other | Admitting: Endocrinology

## 2012-07-02 NOTE — Telephone Encounter (Signed)
Refill done.  

## 2012-07-07 ENCOUNTER — Ambulatory Visit (INDEPENDENT_AMBULATORY_CARE_PROVIDER_SITE_OTHER): Payer: 59 | Admitting: Endocrinology

## 2012-07-07 VITALS — BP 132/74 | HR 80 | Wt 316.0 lb

## 2012-07-07 DIAGNOSIS — E119 Type 2 diabetes mellitus without complications: Secondary | ICD-10-CM

## 2012-07-07 DIAGNOSIS — E1049 Type 1 diabetes mellitus with other diabetic neurological complication: Secondary | ICD-10-CM

## 2012-07-07 LAB — HEMOGLOBIN A1C: Hgb A1c MFr Bld: 7.6 % — ABNORMAL HIGH (ref 4.6–6.5)

## 2012-07-07 LAB — MICROALBUMIN / CREATININE URINE RATIO
Creatinine,U: 204.1 mg/dL
Microalb Creat Ratio: 1.9 mg/g (ref 0.0–30.0)
Microalb, Ur: 3.8 mg/dL — ABNORMAL HIGH (ref 0.0–1.9)

## 2012-07-07 NOTE — Patient Instructions (Addendum)
Please make a follow-up appointment in 3 months. On this type of insulin, it it very important for you to eat your meals on time, especially lunch.   pending the test results, please continue 47 units 2x a day (with the first and last meals of the day).  However, on Sunday mornings, take only 25 units. On this type of insulin schedule, meals should be eaten on a regular schedule.   check your blood sugar 2 times a day.  vary the time of day when you check, between before the 3 meals, and at bedtime.  also check if you have symptoms of your blood sugar being too high or too low.  please keep a record of the readings and bring it to your next appointment here.  please call us sooner if your blood sugar goes below 70, or if it stays over 200.

## 2012-07-07 NOTE — Progress Notes (Signed)
Subjective:    Patient ID: [name], female    DOB: 1951-01-14, 62 y.o.   MRN: [id number]  HPI pt returns for f/u of type 2 dm (dx'ed 2002; complicated by peripheral sensory neuropathy; she has done better with the simpler BID insulin regimen).  pt states she feels well in general.  she brings a record of her cbg's which i have reviewed today. It varies from 87-300, but most are in the mid to high-100's.  There is no trend throughout the day.   pt states she feels well in general, except for chronic pain throughout the body.   Past Medical History  Diagnosis Date  . HYPERLIPIDEMIA 01/11/2009  . DEPRESSION 09/26/2008  . HYPERTENSION 10/06/2006  . DEGENERATIVE JOINT DISEASE 10/06/2006  . Pain in joint, multiple sites 11/10/2006  . INSOMNIA 09/26/2008  . CHEST PAIN 11/18/2007  . Diverticulosis     8469,6295  . Internal hemorrhoids   . OBSTRUCTIVE SLEEP APNEA 06/23/2008    Severe OSA per sleep study 2010, Rx a CPAP  . DIABETES MELLITUS 10/06/2006  . UNSPECIFIED ANEMIA 12/10/2009    Past Surgical History  Procedure Laterality Date  . Right knee replacement  2005  . 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 Colonoscopy  08/01/2011    Procedure: COLONOSCOPY;  Surgeon: Louis Meckel, MD;  Location: [facility];  Service: Endoscopy;  Laterality: N/A;    History   Social History  . Marital Status: Widowed    Spouse Name: N/A    Number of Children: 4  . Years of Education: N/A   Occupational History  .     Social History Main Topics  . Smoking status: Former Smoker -- 0.20 packs/day for 4 years    Types: Cigarettes    Quit date: 05/13/1995  . Smokeless tobacco: Never Used  . Alcohol Use: Yes     Comment: Rarely  . Drug Use: No  . Sexually Active: Not on file   Other Topics Concern  . Not on file   Social History Narrative  . No narrative on file    Current Outpatient Prescriptions on File Prior to Visit  Medication Sig Dispense Refill  . ACCU-CHEK FASTCLIX LANCETS MISC TEST  TWICE A DAY  102 each  12  . albuterol (PROVENTIL HFA;VENTOLIN HFA) 108 (90 BASE) MCG/ACT inhaler Inhale 2 puffs into the lungs every 4 (four) hours as needed for wheezing.  1 Inhaler  0  . aspirin 325 MG tablet Take 325 mg by mouth daily.        . BD ULTRA-FINE PEN NEEDLES 29G X 12.7MM MISC use with HUMALOG KWIKPEN  100 each  4  . Blood Glucose Monitoring Suppl (ACCU-CHEK AVIVA PLUS) W/DEVICE KIT 1 Device by Does not apply route once.  1 kit  0  . budesonide-formoterol (SYMBICORT) 160-4.5 MCG/ACT inhaler Inhale 2 puffs into the lungs 2 (two) times daily.  1 Inhaler  3  . carvedilol (COREG) 25 MG tablet take 1 tablet by mouth twice a day  180 tablet  1  . cloNIDine (CATAPRES) 0.2 MG tablet take 1 tablet by mouth twice a day  180 tablet  1  . CRESTOR 40 MG tablet take 1 tablet by mouth once daily  90 each  3  . diltiazem (TIAZAC) 360 MG 24 hr capsule Take 360 mg by mouth daily.      Marland Kitchen glucose blood (ACCU-CHEK AVIVA) test strip 1 each by Other route 2 (two) times daily. And lancets 2/day,  250.03  100 each  12  . insulin lispro protamine-insulin lispro (HUMALOG MIX 50/50 KWIKPEN) (50-50) 100 UNIT/ML SUSP Inject 47 Units into the skin 2 (two) times daily before a meal.       . Insulin Pen Needle 29G X MISC Use as directed three times daily  100 each  3  . KLOR-CON M10 10 MEQ tablet TAKE 1 TABLET BY MOUTH ONCE DAILY  90 each  0  . metFORMIN (GLUCOPHAGE) 1000 MG tablet take 1 tablet by mouth twice a day with food  180 tablet  1  . MICARDIS 40 MG tablet take 1 tablet by mouth once daily  90 each  0  . MICARDIS 40 MG tablet take 1 tablet by mouth once daily  90 tablet  0  . omeprazole (PRILOSEC) 20 MG capsule Take 1 capsule (20 mg total) by mouth daily.  30 capsule  3   No current facility-administered medications on file prior to visit.    No Known Allergies  Family History  Problem Relation Age of Onset  . Asthma Mother   . Diabetes Mother   . Stroke Mother   . Diabetes Sister   .  Hypertension Sister   . Diabetes Brother   . Hypertension Brother   . Pancreatic cancer Brother     BP 132/74  Pulse 80  Wt 316 lb (143.337 kg)  BMI 48.06 kg/m2  SpO2 97%   Review of Systems denies hypoglycemia.      Objective:   Physical Exam VITAL SIGNS:  See vs page.   GENERAL: no distress. Pulses: dorsalis pedis intact bilat.   Feet: no deformity.  There is a bony prominence at the dorsal aspect of the right foot.  no ulcer on the feet.  feet are of normal color and temp.  trace bilat leg edema.  There is bilateral onychomycosis, and varicosities.   Neuro: sensation is intact to touch on the feet, but slightly decreased from normal.        Assessment & Plan:  DM: this is the best control this pt should aim for, given this regimen, which does match insulin to her changing needs throughout the day.

## 2012-07-09 ENCOUNTER — Ambulatory Visit: Payer: Self-pay | Admitting: Internal Medicine

## 2012-07-19 ENCOUNTER — Other Ambulatory Visit: Payer: Self-pay | Admitting: Endocrinology

## 2012-07-20 ENCOUNTER — Telehealth: Payer: Self-pay | Admitting: Endocrinology

## 2012-07-20 MED ORDER — GLUCOSE BLOOD VI STRP: ORAL_STRIP | Status: DC

## 2012-07-20 NOTE — Telephone Encounter (Signed)
Please call in RF on test strip to [facility], #[phone]. Patient says she only has 2 days worth. / Sherri S.

## 2012-07-21 MED ORDER — ROSUVASTATIN CALCIUM 40 MG PO TABS: 40.0000 mg | ORAL_TABLET | Freq: Every day | ORAL | Status: DC

## 2012-07-23 ENCOUNTER — Encounter: Payer: Self-pay | Admitting: Internal Medicine

## 2012-07-23 ENCOUNTER — Ambulatory Visit (INDEPENDENT_AMBULATORY_CARE_PROVIDER_SITE_OTHER): Payer: 59 | Admitting: Internal Medicine

## 2012-07-23 VITALS — BP 100/68 | HR 80 | Wt 314.0 lb

## 2012-07-23 DIAGNOSIS — I10 Essential (primary) hypertension: Secondary | ICD-10-CM

## 2012-07-23 NOTE — Patient Instructions (Signed)
Check the  blood pressure 2 or 3 times a week, be sure it is between 110/60 and 140/85. If it is consistently higher or lower, let me know ---- Use symbicort twice a day or albuterol 4 times ad ay only if cough or wheezing  --- Please scheduel a physical exam, fasting in 3 to 4 months

## 2012-07-23 NOTE — Assessment & Plan Note (Signed)
Well controlled, BP slt low today but asx. We showed how to use her BP cuff, encouraged to check her BPs, see instructions

## 2012-07-23 NOTE — Assessment & Plan Note (Signed)
Well controlled, not fasting Recheck on RTC

## 2012-07-23 NOTE — Assessment & Plan Note (Signed)
Doing much better, rec to change symbicort to as needed , see instructions

## 2012-07-23 NOTE — Progress Notes (Signed)
  Subjective:    Patient ID: [name], female    DOB: 01/25/1951, 62 y.o.   MRN: [id number]  HPI Routine office visit Hypertension, good medication compliance, she has had BP monitored but does not know how to use it, my nurse tought her how to do it. States she will monitor her BP. BP at recent endocrinology visit was normal, BP today slightly low but she is asymptomatic. Broncospasm, good compliance with Symbicort, feels a lot better.  Past Medical History:   DIABETES MELLITUS   HYPERTENSION   Severe OSA per sleep study 2010, Rx a CPAP   DJD, pain in multiple joints   2006 Cscope --TICs   Depression (Dx 09-2008)   CHEST PAIN:  2004--Cath neg, arterial LE dopplers neg   06-2003-- abd. u/s fatty liver   01-2005-- CP...neg u/s GB, neg HIDA   10-06--saw cards--cardiolite neg   CP--abnormal stress test, Cath 9-09: normal   Past Surgical History:   2005 -R- Knne replacecement   3-09 TKR left   Social History:   Widow, 4 kids   tobacco-- no   ETOH-- rarely   not working, on disability   exercising a little more    Review of Systems Denies any dizziness, weakness, she does not have any orthostatic symptoms today. No chest pain, occasional shortness or breath.  No nausea, vomiting, diarrhea.    Objective:   Physical Exam BP 100/68  Pulse 80  Wt 314 lb (142.429 kg)  BMI 47.75 kg/m2  SpO2 95%  General -- alert, well-developed  Lungs -- normal respiratory effort, no intercostal retractions, no accessory muscle use, and normal breath sounds.   Heart-- normal rate, regular rhythm, no murmur, and no gallop.   Extremities-- trace peri-ankle edema bilaterally  Neurologic-- alert & oriented X3 and strength normal in all extremities. Psych-- Cognition and judgment appear intact. Alert and cooperative with normal attention span and concentration.  not anxious appearing and not depressed appearing.      Assessment & Plan:

## 2012-08-10 ENCOUNTER — Other Ambulatory Visit: Payer: Self-pay | Admitting: Internal Medicine

## 2012-08-10 NOTE — Telephone Encounter (Signed)
Refill done.  

## 2012-08-20 ENCOUNTER — Other Ambulatory Visit: Payer: Self-pay

## 2012-08-20 DIAGNOSIS — Z1231 Encounter for screening mammogram for malignant neoplasm of breast: Secondary | ICD-10-CM

## 2012-09-13 ENCOUNTER — Ambulatory Visit
Admission: RE | Admit: 2012-09-13 | Discharge: 2012-09-13 | Disposition: A | Discharge status: Home / Self Care | Payer: Medicaid Other | Source: Ambulatory Visit

## 2012-09-13 DIAGNOSIS — Z1231 Encounter for screening mammogram for malignant neoplasm of breast: Secondary | ICD-10-CM

## 2012-09-15 ENCOUNTER — Other Ambulatory Visit: Payer: Self-pay | Admitting: Endocrinology

## 2012-10-05 ENCOUNTER — Ambulatory Visit: Payer: 59 | Admitting: Endocrinology

## 2012-10-08 ENCOUNTER — Encounter: Payer: Self-pay | Admitting: Endocrinology

## 2012-10-08 ENCOUNTER — Ambulatory Visit (INDEPENDENT_AMBULATORY_CARE_PROVIDER_SITE_OTHER): Payer: PRIVATE HEALTH INSURANCE | Admitting: Endocrinology

## 2012-10-08 VITALS — BP 144/80 | Ht 68.0 in | Wt 314.0 lb

## 2012-10-08 DIAGNOSIS — E119 Type 2 diabetes mellitus without complications: Secondary | ICD-10-CM

## 2012-10-08 DIAGNOSIS — E1049 Type 1 diabetes mellitus with other diabetic neurological complication: Secondary | ICD-10-CM

## 2012-10-08 LAB — HEMOGLOBIN A1C
Hgb A1c MFr Bld: 7.4 % — ABNORMAL HIGH (ref <5.7)
Mean Plasma Glucose: 166 mg/dL — ABNORMAL HIGH (ref <117)

## 2012-10-08 NOTE — Patient Instructions (Addendum)
Please make a follow-up appointment in 3 months. On this type of insulin, it it very important for you to eat your meals on time, especially lunch.   pending the test results, please continue 47 units 2x a day (with the first and last meals of the day).  However, on Sunday mornings, take only 25 units. On this type of insulin schedule, meals should be eaten on a regular schedule.   check your blood sugar 2 times a day.  vary the time of day when you check, between before the 3 meals, and at bedtime.  also check if you have symptoms of your blood sugar being too high or too low.  please keep a record of the readings and bring it to your next appointment here.  please call us sooner if your blood sugar goes below 70, or if it stays over 200.   blood tests are being requested for you today.  We'll contact you with results.

## 2012-10-08 NOTE — Progress Notes (Signed)
Subjective:    Patient ID: [name], female    DOB: 08-29-1950, 62 y.o.   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  pt states she feels well in general.  she brings a record of her cbg's which i have reviewed today. It varies from 98-200's, but most are in the mid to high-100's.  There is no trend throughout the day.   pt states she feels well in general, except for anxiety. Past Medical History  Diagnosis Date  . HYPERLIPIDEMIA 01/11/2009  . DEPRESSION 09/26/2008  . HYPERTENSION 10/06/2006  . DEGENERATIVE JOINT DISEASE 10/06/2006  . Pain in joint, multiple sites 11/10/2006  . INSOMNIA 09/26/2008  . CHEST PAIN 11/18/2007  . Diverticulosis     7829,5621  . Internal hemorrhoids   . OBSTRUCTIVE SLEEP APNEA 06/23/2008    Severe OSA per sleep study 2010, Rx a CPAP  . DIABETES MELLITUS 10/06/2006  . UNSPECIFIED ANEMIA 12/10/2009    Past Surgical History  Procedure Laterality Date  . Right knee replacement  2005  . 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 Colonoscopy  08/01/2011    Procedure: COLONOSCOPY;  Surgeon: Louis Meckel, MD;  Location: [facility];  Service: Endoscopy;  Laterality: N/A;    History   Social History  . Marital Status: Widowed    Spouse Name: N/A    Number of Children: 4  . Years of Education: N/A   Occupational History  .     Social History Main Topics  . Smoking status: Former Smoker -- 0.20 packs/day for 4 years    Types: Cigarettes    Quit date: 05/13/1995  . Smokeless tobacco: Never Used  . Alcohol Use: Yes     Comment: Rarely  . Drug Use: No  . Sexually Active: Not on file   Other Topics Concern  . Not on file   Social History Narrative  . No narrative on file    Current Outpatient Prescriptions on File Prior to Visit  Medication Sig Dispense Refill  . ACCU-CHEK  FASTCLIX LANCETS MISC TEST TWICE A DAY  102 each  12  . albuterol (PROVENTIL HFA;VENTOLIN HFA) 108 (90 BASE) MCG/ACT inhaler Inhale 2 puffs into the lungs every 4 (four) hours as needed for wheezing.  1 Inhaler  0  . aspirin 325 MG tablet Take 325 mg by mouth daily.        . BD ULTRA-FINE PEN NEEDLES 29G X 12.7MM MISC use with HUMALOG KWIKPEN  100 each  4  . Blood Glucose Monitoring Suppl (ACCU-CHEK AVIVA PLUS) W/DEVICE KIT 1 Device by Does not apply route once.  1 kit  0  . budesonide-formoterol (SYMBICORT) 160-4.5 MCG/ACT inhaler Inhale 2 puffs into the lungs 2 (two) times daily as needed (cough or wheezing).      . carvedilol (COREG) 25 MG tablet TAKE 1 TABLET BY MOUTH 2 TIMES DAILY  180 tablet  0  . cloNIDine (CATAPRES) 0.2 MG tablet TAKE 1 TABLET BY MOUTH 2 TIMES DAILY  180 tablet  0  . diltiazem (TIAZAC) 360 MG 24 hr capsule take 1 capsule by mouth daily  90 capsule  0  . glucose blood (ACCU-CHEK AVIVA PLUS) test strip TEST twice a day  100 each  12  . insulin lispro protamine-insulin lispro (HUMALOG MIX 50/50 KWIKPEN) (50-50) 100  UNIT/ML SUSP Inject 47 Units into the skin 2 (two) times daily before a meal.       . Insulin Pen Needle 29G X MISC Use as directed three times daily  100 each  3  . KLOR-CON M10 10 MEQ tablet take 1 tablet by mouth once daily  90 tablet  0  . metFORMIN (GLUCOPHAGE) 1000 MG tablet TAKE 1 TABLET BY MOUTH 2 TIMES FAILY WITH FOOD  180 tablet  0  . MICARDIS 40 MG tablet take 1 tablet by mouth once daily  90 tablet  0  . omeprazole (PRILOSEC) 20 MG capsule Take 1 capsule (20 mg total) by mouth daily.  30 capsule  3  . rosuvastatin (CRESTOR) 40 MG tablet Take 1 tablet (40 mg total) by mouth daily.  30 tablet  2  . diltiazem (TIAZAC) 360 MG 24 hr capsule Take 360 mg by mouth daily.       No current facility-administered medications on file prior to visit.    No Known Allergies  Family History  Problem Relation Age of Onset  . Asthma Mother   . Diabetes Mother    . Stroke Mother   . Diabetes Sister   . Hypertension Sister   . Diabetes Brother   . Hypertension Brother   . Pancreatic cancer Brother    BP 144/80  Ht 5\' 8"  (1.727 m)  Wt 314 lb (142.429 kg)  BMI 47.75 kg/m2  SpO2 98%  Review of Systems Denies weight change and hypoglycemia    Objective:   Physical Exam VITAL SIGNS:  See vs page GENERAL: no distress     Assessment & Plan:  DM: This insulin regimen was chosen from multiple options, due to her need for simplicity.  The benefits of glycemic control must be weighed against the risks of hypoglycemia.

## 2012-10-12 ENCOUNTER — Encounter: Payer: Self-pay | Admitting: *Deleted

## 2012-10-20 ENCOUNTER — Encounter: Payer: Self-pay | Admitting: Internal Medicine

## 2012-10-22 ENCOUNTER — Ambulatory Visit (INDEPENDENT_AMBULATORY_CARE_PROVIDER_SITE_OTHER): Payer: PRIVATE HEALTH INSURANCE | Admitting: Internal Medicine

## 2012-10-22 ENCOUNTER — Encounter: Payer: Self-pay | Admitting: Internal Medicine

## 2012-10-22 VITALS — BP 128/84 | HR 70 | Temp 98.3°F | Ht 68.0 in | Wt 313.0 lb

## 2012-10-22 DIAGNOSIS — J9801 Acute bronchospasm: Secondary | ICD-10-CM

## 2012-10-22 DIAGNOSIS — E785 Hyperlipidemia, unspecified: Secondary | ICD-10-CM

## 2012-10-22 DIAGNOSIS — Z0000 Encounter for general adult medical examination without abnormal findings: Secondary | ICD-10-CM

## 2012-10-22 DIAGNOSIS — I10 Essential (primary) hypertension: Secondary | ICD-10-CM

## 2012-10-22 LAB — LIPID PANEL
HDL: 64.7 mg/dL (ref >39.00)
LDL Cholesterol: 62 mg/dL (ref 0–99)
Total CHOL/HDL Ratio: 2
Triglycerides: 148 mg/dL (ref 0.0–149.0)

## 2012-10-22 LAB — CBC WITH DIFFERENTIAL/PLATELET
Basophils Absolute: 0 10*3/uL (ref 0.0–0.1)
Eosinophils Absolute: 0.1 10*3/uL (ref 0.0–0.7)
Lymphocytes Relative: 34.5 % (ref 12.0–46.0)
MCHC: 32.9 g/dL (ref 30.0–36.0)
Neutrophils Relative %: 55.3 % (ref 43.0–77.0)
Platelets: 297 10*3/uL (ref 150.0–400.0)
RDW: 15 % — ABNORMAL HIGH (ref 11.5–14.6)

## 2012-10-22 LAB — URINALYSIS
Hgb urine dipstick: NEGATIVE
Nitrite: NEGATIVE
Urine Glucose: NEGATIVE
Urobilinogen, UA: 0.2 (ref 0.0–1.0)

## 2012-10-22 LAB — COMPREHENSIVE METABOLIC PANEL
AST: 19 U/L (ref 0–37)
Alkaline Phosphatase: 69 U/L (ref 39–117)
BUN: 18 mg/dL (ref 6–23)
Creatinine, Ser: 0.9 mg/dL (ref 0.4–1.2)
Potassium: 3.9 mEq/L (ref 3.5–5.1)

## 2012-10-22 MED ORDER — ZOSTER VACCINE LIVE 19400 UNT/0.65ML ~~LOC~~ SOLR: 0.6500 mL | Freq: Once | SUBCUTANEOUS | Status: DC

## 2012-10-22 NOTE — Assessment & Plan Note (Signed)
Due for labs

## 2012-10-22 NOTE — Assessment & Plan Note (Signed)
Control on Symbicort twice a day, rarely uses albuterol.

## 2012-10-22 NOTE — Assessment & Plan Note (Addendum)
Tdap 1-14 Pneumonia 1-14 zostavax rx provided, benefits discussed  PAP per Dr Laury Axon , last 12-2011 (-) MMG 09-2012 (-) Cscope 2013, next 10 years Diet-exercise discussed  DEXA (-) 2010--- repeat DEXA

## 2012-10-22 NOTE — Progress Notes (Signed)
  Subjective:    Patient ID: [name], female    DOB: [birth year], 62 y.o.   MRN: [id number]  HPI CPX  Past Medical History  Diagnosis Date  . HYPERLIPIDEMIA 01/11/2009  . DEPRESSION 09/26/2008  . HYPERTENSION 10/06/2006  . DEGENERATIVE JOINT DISEASE 10/06/2006  . Pain in joint, multiple sites 11/10/2006  . INSOMNIA 09/26/2008  . CHEST PAIN 11/18/2007  . Diverticulosis     [id number]  . Internal hemorrhoids   . OBSTRUCTIVE SLEEP APNEA 06/23/2008    Severe OSA per sleep study 2010, Rx a CPAP  . DIABETES MELLITUS 10/06/2006  . UNSPECIFIED ANEMIA 12/10/2009   Past Surgical History  Procedure Laterality Date  . Right knee replacement  2005  . 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 Colonoscopy  08/01/2011    Procedure: COLONOSCOPY;  Surgeon: Louis Meckel, MD;  Location: [facility];  Service: Endoscopy;  Laterality: N/A;   History   Social History  . Marital Status: Widowed    Spouse Name: N/A    Number of Children: 4  . Years of Education: N/A   Occupational History  . disability    Social History Main Topics  . Smoking status: Former Smoker -- 0.20 packs/day for 4 years    Types: Cigarettes    Quit date: 05/13/1995  . Smokeless tobacco: Never Used  . Alcohol Use: Yes     Comment: Rarely  . Drug Use: No  . Sexually Active: Not on file   Other Topics Concern  . Not on file   Social History Narrative   Widow    Family History  Problem Relation Age of Onset  . Asthma Mother   . Stroke Mother   . Diabetes Other     M, B, S  . Hypertension Sister     M, S,B  . Pancreatic cancer Brother   . Colon cancer Neg Hx   . Prostate cancer Neg Hx   . Breast cancer Neg Hx     Review of Systems Denies chest pain or shortness or breath No  nausea, vomiting, diarrhea or blood in the stools. No dysuria or gross hematuria but she urinates "frequently". Ambulatory CBGs varies, could not tell me an average. Good compliance with medds, take Symbicort twice a day and rarely uses  albuterol    Objective:   Physical Exam  General -- alert, well-developed, NAD.   Neck --no thyromegaly  Lungs -- normal respiratory effort, no intercostal retractions, no accessory muscle use, and normal breath sounds.   Heart-- normal rate, regular rhythm, no murmur, and no gallop.   Abdomen--soft, non-tender, no distention.  Extremities-- R slt >L periankle edema bilaterally  Neurologic-- alert & oriented X3 and strength normal in all extremities. Psych-- Cognition and judgment appear intact. Alert and cooperative with normal attention span and concentration.  not anxious appearing and not depressed appearing.       Assessment & Plan:

## 2012-10-22 NOTE — Assessment & Plan Note (Addendum)
Good compliance with medications, BP today okay, reports ambulatory BPs are good  but could not tell me any readings

## 2012-10-23 ENCOUNTER — Encounter: Payer: Self-pay | Admitting: Internal Medicine

## 2012-10-25 ENCOUNTER — Encounter: Payer: Self-pay | Admitting: *Deleted

## 2012-10-26 ENCOUNTER — Telehealth: Payer: Self-pay | Admitting: *Deleted

## 2012-10-26 MED ORDER — ERGOCALCIFEROL 1.25 MG (50000 UT) PO CAPS: 50000.0000 [IU] | ORAL_CAPSULE | ORAL | Status: DC

## 2012-10-26 NOTE — Telephone Encounter (Signed)
Discussed with pt, sent rx to pharmacy.  

## 2012-10-26 NOTE — Telephone Encounter (Signed)
Message copied by Nada Maclachlan on Tue Oct 26, 2012  5:09 PM ------      Message from: Wanda Plump      Created: Sat Oct 23, 2012  7:43 PM       Patient results:      Cholesterol is very good.      Her vitamin D is low, needs ergocalciferol one tablet weekly for 3 months, #12, no refills.      She also needs to take OTC vitamin D   between 600 units and 1000 units qd.      Otherwise there results are good, she is to continue taking the same medications. ------

## 2012-11-09 ENCOUNTER — Other Ambulatory Visit: Payer: Self-pay | Admitting: Internal Medicine

## 2012-11-09 NOTE — Telephone Encounter (Signed)
Refill done.  

## 2012-11-18 ENCOUNTER — Other Ambulatory Visit: Payer: Self-pay

## 2012-12-20 ENCOUNTER — Ambulatory Visit: Payer: Medicare Other | Admitting: Pulmonary Disease

## 2012-12-31 ENCOUNTER — Encounter: Payer: Self-pay | Admitting: Pulmonary Disease

## 2012-12-31 ENCOUNTER — Other Ambulatory Visit: Payer: Self-pay | Admitting: Internal Medicine

## 2012-12-31 ENCOUNTER — Ambulatory Visit (INDEPENDENT_AMBULATORY_CARE_PROVIDER_SITE_OTHER): Payer: PRIVATE HEALTH INSURANCE | Admitting: Pulmonary Disease

## 2012-12-31 VITALS — BP 112/78 | HR 77 | Temp 97.6°F | Ht 68.0 in | Wt 323.8 lb

## 2012-12-31 DIAGNOSIS — G4733 Obstructive sleep apnea (adult) (pediatric): Secondary | ICD-10-CM

## 2012-12-31 NOTE — Progress Notes (Signed)
  Subjective:    Patient ID: [name], female    DOB: Feb 25, 1951, 62 y.o.   MRN: [id number]  HPI Patient comes in today for followup of her obstructive sleep apnea.  She is completely CPAP intolerant, and we have been prescribing oxygen during sleep to help prevent worsening pulmonary hypertension.  The patient has been wearing the oxygen a few times a week, but is concerned that it may cause pneumonia.  We are certainly able to have her home care company check her filter.  She continues to have disrupted sleep and significant daytime sleepiness.   Review of Systems  Constitutional: Negative for fever and unexpected weight change.  HENT: Negative for ear pain, nosebleeds, congestion, sore throat, rhinorrhea, sneezing, trouble swallowing, dental problem, postnasal drip and sinus pressure.   Eyes: Negative for redness and itching.  Respiratory: Negative for cough, chest tightness, shortness of breath and wheezing.   Cardiovascular: Negative for palpitations and leg swelling.  Gastrointestinal: Negative for nausea and vomiting.  Genitourinary: Negative for dysuria.  Musculoskeletal: Negative for joint swelling.  Skin: Negative for rash.  Neurological: Negative for headaches.  Hematological: Does not bruise/bleed easily.  Psychiatric/Behavioral: Negative for dysphoric mood. The patient is not nervous/anxious.        Objective:   Physical Exam Morbidly obese female in no acute distress Nose without purulent discharge noted Neck without lymphadenopathy or thyromegaly Lower extremities with mild edema, no cyanosis Awake, but does appear sleepy, moves all 4 extremities.       Assessment & Plan:

## 2012-12-31 NOTE — Assessment & Plan Note (Signed)
The patient is wearing oxygen periodically for her nocturnal desaturation associated with sleep disorder breathing.  However, she is concerned her oxygen concentrator may be causing pneumonia.  I will have her home care company check her concentrator filter.  I've stressed to her the importance of aggressive weight loss, and trying to wear her oxygen as much as possible during sleep.

## 2012-12-31 NOTE — Patient Instructions (Addendum)
Try and wear oxygen as much as possible while sleeping, not while awake. Will send an order to your home care company getting them to check the filter on your oxygen concentrator. Work on weight loss followup with me in one year.

## 2013-01-04 NOTE — Telephone Encounter (Signed)
Med filled.  

## 2013-01-14 ENCOUNTER — Encounter: Payer: Self-pay | Admitting: Endocrinology

## 2013-01-14 ENCOUNTER — Ambulatory Visit (INDEPENDENT_AMBULATORY_CARE_PROVIDER_SITE_OTHER): Payer: Medicare Other | Admitting: Endocrinology

## 2013-01-14 VITALS — BP 136/80 | HR 78 | Ht 69.0 in | Wt 325.0 lb

## 2013-01-14 DIAGNOSIS — E119 Type 2 diabetes mellitus without complications: Secondary | ICD-10-CM

## 2013-01-14 DIAGNOSIS — Z0000 Encounter for general adult medical examination without abnormal findings: Secondary | ICD-10-CM

## 2013-01-14 DIAGNOSIS — E1049 Type 1 diabetes mellitus with other diabetic neurological complication: Secondary | ICD-10-CM

## 2013-01-14 MED ORDER — AMOXICILLIN-POT CLAVULANATE 875-125 MG PO TABS: 1.0000 | ORAL_TABLET | Freq: Two times a day (BID) | ORAL | Status: AC

## 2013-01-14 MED ORDER — INSULIN LISPRO PROT & LISPRO (50-50 MIX) 100 UNIT/ML ~~LOC~~ SUSP: 47.0000 [IU] | Freq: Two times a day (BID) | SUBCUTANEOUS | Status: DC

## 2013-01-14 NOTE — Progress Notes (Signed)
Subjective:    Patient ID: [name], female    DOB: 1950-09-23, 62 y.o.   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  she brings a record of her cbg's which i have reviewed today.  Almost all are in the 100's.  There is no trend throughout the day.  Pt states few days of moderate pain at the right buttock, and assoc drainage there.  Past Medical History  Diagnosis Date  . HYPERLIPIDEMIA 01/11/2009  . DEPRESSION 09/26/2008  . HYPERTENSION 10/06/2006  . DEGENERATIVE JOINT DISEASE 10/06/2006  . Pain in joint, multiple sites 11/10/2006  . INSOMNIA 09/26/2008  . CHEST PAIN 11/18/2007  . Diverticulosis     4098,1191  . Internal hemorrhoids   . OBSTRUCTIVE SLEEP APNEA 06/23/2008    Severe OSA per sleep study 2010, Rx a CPAP  . DIABETES MELLITUS 10/06/2006  . UNSPECIFIED ANEMIA 12/10/2009    Past Surgical History  Procedure Laterality Date  . Right knee replacement  2005  . 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 Colonoscopy  08/01/2011    Procedure: COLONOSCOPY;  Surgeon: Louis Meckel, MD;  Location: [facility];  Service: Endoscopy;  Laterality: N/A;    History   Social History  . Marital Status: Widowed    Spouse Name: N/A    Number of Children: 4  . Years of Education: N/A   Occupational History  . disability    Social History Main Topics  . Smoking status: Former Smoker -- 0.20 packs/day for 4 years    Types: Cigarettes    Quit date: 05/13/1995  . Smokeless tobacco: Never Used  . Alcohol Use: Yes     Comment: Rarely  . Drug Use: No  . Sexual Activity: Not on file   Other Topics Concern  . Not on file   Social History Narrative   Widow     Current Outpatient Prescriptions on File Prior to Visit  Medication Sig Dispense Refill  . ACCU-CHEK FASTCLIX LANCETS MISC TEST TWICE A DAY  102 each   12  . aspirin 325 MG tablet Take 325 mg by mouth daily.        . BD ULTRA-FINE PEN NEEDLES 29G X 12.7MM MISC use with HUMALOG KWIKPEN  100 each  4  . Blood Glucose Monitoring Suppl (ACCU-CHEK AVIVA PLUS) W/DEVICE KIT 1 Device by Does not apply route once.  1 kit  0  . budesonide-formoterol (SYMBICORT) 160-4.5 MCG/ACT inhaler Inhale 2 puffs into the lungs 2 (two) times daily as needed (cough or wheezing).      . carvedilol (COREG) 25 MG tablet take 1 tablet by mouth twice a day  180 tablet  1  . cloNIDine (CATAPRES) 0.2 MG tablet take 1 tablet by mouth twice a day  180 tablet  1  . diltiazem (TIAZAC) 360 MG 24 hr capsule take 1 capsule by mouth once daily  90 capsule  1  . ergocalciferol (VITAMIN D2) 50000 UNITS capsule Take 1 capsule (50,000 Units total) by mouth once a week.  12 capsule  0  . glucose blood (ACCU-CHEK AVIVA PLUS) test strip TEST twice a day  100 each  12  . Insulin Pen Needle 29G X MISC Use as directed three times daily  100 each  3  . KLOR-CON M10 10 MEQ tablet  take 1 tablet by mouth once daily  90 tablet  1  . metFORMIN (GLUCOPHAGE) 1000 MG tablet take 1 tablet by mouth twice a day with food  180 tablet  1  . omeprazole (PRILOSEC) 20 MG capsule Take 1 capsule (20 mg total) by mouth daily.  30 capsule  3  . rosuvastatin (CRESTOR) 40 MG tablet Take 1 tablet (40 mg total) by mouth daily.  30 tablet  2  . telmisartan (MICARDIS) 40 MG tablet take 1 tablet by mouth once daily  90 tablet  0  . zoster vaccine live, PF, (ZOSTAVAX) 86578 UNT/0.65ML injection Inject 19,400 Units into the skin once.  1 each  0  . albuterol (PROVENTIL HFA;VENTOLIN HFA) 108 (90 BASE) MCG/ACT inhaler Inhale 2 puffs into the lungs every 4 (four) hours as needed for wheezing.  1 Inhaler  0   No current facility-administered medications on file prior to visit.    No Known Allergies  Family History  Problem Relation Age of Onset  . Asthma Mother   . Stroke Mother   . Diabetes Other     M, B, S  .  Hypertension Sister     M, S,B  . Pancreatic cancer Brother   . Colon cancer Neg Hx   . Prostate cancer Neg Hx   . Breast cancer Neg Hx     BP 136/80  Pulse 78  Ht 5\' 9"  (1.753 m)  Wt 325 lb (147.419 kg)  BMI 47.97 kg/m2  SpO2 97%  Review of Systems denies hypoglycemia and fever    Objective:   Physical Exam VITAL SIGNS:  See vs page GENERAL: no distress Right buttock: slightly draining abscess.  Only slight tenderness.     Assessment & Plan:  DM: This insulin regimen was chosen from multiple options, due to her need for simplicity.  The benefits of glycemic control must be weighed against the risks of hypoglycemia. Abscess of the buttock, new

## 2013-01-14 NOTE — Patient Instructions (Addendum)
Please make a follow-up appointment in 3 months. On this type of insulin schedule, you should eat meals on a regular schedule.  If a meal is missed or significantly delayed, your blood sugar could go low. check your blood sugar 2 times a day.  vary the time of day when you check, between before the 3 meals, and at bedtime.  also check if you have symptoms of your blood sugar being too high or too low.  please keep a record of the readings and bring it to your next appointment here.  please call us sooner if your blood sugar goes below 70, or if it stays over 200.   blood tests are being requested for you today.  We'll contact you with results.  i have sent a prescription to your pharmacy, for the infection.

## 2013-03-29 ENCOUNTER — Other Ambulatory Visit: Payer: Self-pay | Admitting: Internal Medicine

## 2013-03-30 NOTE — Telephone Encounter (Signed)
Telmisartan refilled per procotol

## 2013-04-15 ENCOUNTER — Encounter: Payer: Self-pay | Admitting: Endocrinology

## 2013-04-15 ENCOUNTER — Ambulatory Visit (INDEPENDENT_AMBULATORY_CARE_PROVIDER_SITE_OTHER): Payer: Medicare Other | Admitting: Endocrinology

## 2013-04-15 VITALS — BP 108/62 | HR 86 | Temp 97.8°F | Ht 69.0 in | Wt 314.5 lb

## 2013-04-15 DIAGNOSIS — E1049 Type 1 diabetes mellitus with other diabetic neurological complication: Secondary | ICD-10-CM

## 2013-04-15 MED ORDER — DOXYCYCLINE HYCLATE 100 MG PO TABS: 100.0000 mg | ORAL_TABLET | Freq: Two times a day (BID) | ORAL | Status: DC

## 2013-04-15 NOTE — Patient Instructions (Addendum)
Please make a follow-up appointment in 3 months. On this type of insulin schedule, you should eat meals on a regular schedule.  If a meal is missed or significantly delayed, your blood sugar could go low. check your blood sugar 2 times a day.  vary the time of day when you check, between before the 3 meals, and at bedtime.  also check if you have symptoms of your blood sugar being too high or too low.  please keep a record of the readings and bring it to your next appointment here.  please call us sooner if your blood sugar goes below 70, or if it stays over 200.   blood tests are being requested for you today.  We'll contact you with results.  i have sent a prescription to your pharmacy, for an antibiotic pill.  However, if it come back, please see dr Drue Novel, because this might be a virus infection.   Please reduce the insulin to 44 units, twice a day.

## 2013-04-15 NOTE — Progress Notes (Signed)
Subjective:    Patient ID: [name], female    DOB: May 23, 1950, 62 y.o.   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   she brings a record of her cbg's which i have reviewed today.  Almost all are in the 100's.  There is no trend throughout the day.  She has had 3 episodes of mild hypoglycemia (40's).  Two of these were before lunch, and 1 was in the afternoon.   Pt again states few days of moderate pain at the right buttock, and assoc drainage there.  Past Medical History  Diagnosis Date  . HYPERLIPIDEMIA 01/11/2009  . DEPRESSION 09/26/2008  . HYPERTENSION 10/06/2006  . DEGENERATIVE JOINT DISEASE 10/06/2006  . Pain in joint, multiple sites 11/10/2006  . INSOMNIA 09/26/2008  . CHEST PAIN 11/18/2007  . Diverticulosis     9528,4132  . Internal hemorrhoids   . OBSTRUCTIVE SLEEP APNEA 06/23/2008    Severe OSA per sleep study 2010, Rx a CPAP  . DIABETES MELLITUS 10/06/2006  . UNSPECIFIED ANEMIA 12/10/2009    Past Surgical History  Procedure Laterality Date  . Right knee replacement  2005  . 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 Colonoscopy  08/01/2011    Procedure: COLONOSCOPY;  Surgeon: Louis Meckel, MD;  Location: [facility];  Service: Endoscopy;  Laterality: N/A;    History   Social History  . Marital Status: Widowed    Spouse Name: N/A    Number of Children: 4  . Years of Education: N/A   Occupational History  . disability    Social History Main Topics  . Smoking status: Former Smoker -- 0.20 packs/day for 4 years    Types: Cigarettes    Quit date: 05/13/1995  . Smokeless tobacco: Never Used  . Alcohol Use: Yes     Comment: Rarely  . Drug Use: No  . Sexual Activity: Not on file   Other Topics Concern  .  Not on file   Social History Narrative   Widow     Current Outpatient Prescriptions on File Prior to Visit  Medication Sig Dispense Refill  . ACCU-CHEK FASTCLIX LANCETS MISC TEST TWICE A DAY  102 each  12  . aspirin 325 MG tablet Take 325 mg by mouth daily.        . BD ULTRA-FINE PEN NEEDLES 29G X 12.7MM MISC use with HUMALOG KWIKPEN  100 each  4  . Blood Glucose Monitoring Suppl (ACCU-CHEK AVIVA PLUS) W/DEVICE KIT 1 Device by Does not apply route once.  1 kit  0  . budesonide-formoterol (SYMBICORT) 160-4.5 MCG/ACT inhaler Inhale 2 puffs into the lungs 2 (two) times daily as needed (cough or wheezing).      . carvedilol (COREG) 25 MG tablet take 1 tablet by mouth twice a day  180 tablet  1  . cloNIDine (CATAPRES) 0.2 MG tablet take 1 tablet by mouth twice a day  180 tablet  1  . diltiazem (TIAZAC) 360 MG 24 hr capsule take 1 capsule by mouth once daily  90 capsule  1  . ergocalciferol (VITAMIN D2) 50000 UNITS capsule Take 1 capsule (50,000 Units total) by mouth once a week.  12 capsule  0  . glucose  blood (ACCU-CHEK AVIVA PLUS) test strip TEST twice a day  100 each  12  . Insulin Pen Needle 29G X MISC Use as directed three times daily  100 each  3  . KLOR-CON M10 10 MEQ tablet take 1 tablet by mouth once daily  90 tablet  1  . metFORMIN (GLUCOPHAGE) 1000 MG tablet take 1 tablet by mouth twice a day with food  180 tablet  1  . omeprazole (PRILOSEC) 20 MG capsule Take 1 capsule (20 mg total) by mouth daily.  30 capsule  3  . rosuvastatin (CRESTOR) 40 MG tablet Take 1 tablet (40 mg total) by mouth daily.  30 tablet  2  . telmisartan (MICARDIS) 40 MG tablet take 1 tablet by mouth once daily  90 tablet  1  . zoster vaccine live, PF, (ZOSTAVAX) 16109 UNT/0.65ML injection Inject 19,400 Units into the skin once.  1 each  0  . albuterol (PROVENTIL HFA;VENTOLIN HFA) 108 (90 BASE) MCG/ACT inhaler Inhale 2 puffs into the lungs every 4 (four) hours as needed for wheezing.  1 Inhaler  0   No  current facility-administered medications on file prior to visit.    No Known Allergies  Family History  Problem Relation Age of Onset  . Asthma Mother   . Stroke Mother   . Diabetes Other     M, B, S  . Hypertension Sister     M, S,B  . Pancreatic cancer Brother   . Colon cancer Neg Hx   . Prostate cancer Neg Hx   . Breast cancer Neg Hx     BP 108/62  Pulse 86  Temp(Src) 97.8 F (36.6 C) (Oral)  Ht 5\' 9"  (1.753 m)  Wt 314 lb 8 oz (142.656 kg)  BMI 46.42 kg/m2  SpO2 95%   Review of Systems denies LOC and fever.      Objective:   Physical Exam VITAL SIGNS:  See vs page GENERAL: no distress Right buttock: 1 cm pustule, vs vesicle  Lab Results  Component Value Date   HGBA1C 7.4* 04/15/2013      Assessment & Plan:  Pustule, recurrent, uncertain etiology.  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  We don't have bx swabs here, so she is advised to see dr Drue Novel the next time she has this.  DM: This insulin regimen was chosen from multiple options, due to her need for simplicity.  The benefits of glycemic control must be weighed against the risks of hypoglycemia.  Overcontrolled, due to this regimen, which does match insulin to her changing needs throughout the day.

## 2013-04-25 ENCOUNTER — Ambulatory Visit (INDEPENDENT_AMBULATORY_CARE_PROVIDER_SITE_OTHER): Payer: PRIVATE HEALTH INSURANCE | Admitting: Internal Medicine

## 2013-04-25 ENCOUNTER — Encounter: Payer: Self-pay | Admitting: Internal Medicine

## 2013-04-25 VITALS — BP 105/67 | HR 86 | Temp 98.3°F | Wt 316.0 lb

## 2013-04-25 DIAGNOSIS — Z23 Encounter for immunization: Secondary | ICD-10-CM

## 2013-04-25 DIAGNOSIS — R0609 Other forms of dyspnea: Secondary | ICD-10-CM

## 2013-04-25 DIAGNOSIS — I10 Essential (primary) hypertension: Secondary | ICD-10-CM

## 2013-04-25 NOTE — Assessment & Plan Note (Signed)
Over controlled? Seed last 2  BP readings. Plan: Decrease coreg  25 mg  to half tablet twice a day

## 2013-04-25 NOTE — Assessment & Plan Note (Addendum)
Today she reports dyspnea on exertion for a long time and some orthopnea per history. JVD is negative, she has stable lower extremity edema. She had extensive cardiac w/u in the past, see below. EKG today-- no acute Plan: observation. Deconditioning? Obesity? ----- 2004--Cath neg, areterial LE dopplersneg 06-2003-- abd. u/s fatty liver 01-2005-- CP...neg u/s GB, neg HIDA 10-06--saw cards--cardiolite neg 12-2007 abnormal stress test Cath 02-01-08: normal coronaries

## 2013-04-25 NOTE — Progress Notes (Signed)
   Subjective:    Patient ID: [name], female    DOB: [birth year], 62 y.o.   MRN: [id number]  HPI Routine office visit Hypertension--good medication compliance, the last 2 BPs available in the computer are low Also anxiety, lost 2 family members, counseled   Past Medical History  Diagnosis Date  . HYPERLIPIDEMIA 01/11/2009  . DEPRESSION 09/26/2008  . HYPERTENSION 10/06/2006  . DEGENERATIVE JOINT DISEASE 10/06/2006  . Pain in joint, multiple sites 11/10/2006  . INSOMNIA 09/26/2008  . CHEST PAIN 11/18/2007  . Diverticulosis     4098,1191  . Internal hemorrhoids   . OBSTRUCTIVE SLEEP APNEA 06/23/2008    Severe OSA per sleep study 2010, Rx a CPAP  . DIABETES MELLITUS 10/06/2006  . UNSPECIFIED ANEMIA 12/10/2009   Past Surgical History  Procedure Laterality Date  . Right knee replacement  2005  . 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 Colonoscopy  08/01/2011    Procedure: COLONOSCOPY;  Surgeon: Louis Meckel, MD;  Location: [facility];  Service: Endoscopy;  Laterality: N/A;      Review of Systems  Denies chest pain or difficulty breathing at rest but reports that "for a while" she gets short of breath with physical activity (1/2 block)  and when she lays down, uses 3 pillows to sleep. She does have some degree of lower extremity edema which is not new.     Objective:   Physical Exam BP 105/67  Pulse 86  Temp(Src) 98.3 F (36.8 C)  Wt 316 lb (143.337 kg)  SpO2 97% General -- alert, well-developed, NAD.  Neck --no JVD at 45  Lungs -- normal respiratory effort, no intercostal retractions, no accessory muscle use, and normal breath sounds.  Heart-- normal rate, regular rhythm, no murmur.   Extremities-- +/+++  pretibial edema bilaterally  Neurologic--  alert & oriented X3.     Psych--   Mildly  anxious and depressed appearing.     Assessment & Plan:

## 2013-04-25 NOTE — Patient Instructions (Signed)
Take only half tablet carvedilol twice a day  Check the  blood pressure 2 or 3 times a week be sure it is between 110/60 and 140/85. Ideal blood pressure is 120/80. If it is consistently higher or lower, let me know   Next visit for a  follow up  In 3 months  Please make an appointment

## 2013-04-25 NOTE — Progress Notes (Signed)
Pre visit review using our clinic review tool, if applicable. No additional management support is needed unless otherwise documented below in the visit note. 

## 2013-05-12 ENCOUNTER — Other Ambulatory Visit: Payer: Self-pay | Admitting: Internal Medicine

## 2013-05-13 NOTE — Telephone Encounter (Signed)
Potassium, Clonidine, Taztia, Metformin and Carvedilol refilled per protocol. JG//CMA

## 2013-06-03 ENCOUNTER — Other Ambulatory Visit: Payer: Self-pay | Admitting: Pulmonary Disease

## 2013-06-15 ENCOUNTER — Telehealth: Payer: Self-pay | Admitting: Endocrinology

## 2013-06-15 NOTE — Telephone Encounter (Signed)
Pt informed

## 2013-06-15 NOTE — Telephone Encounter (Signed)
please call patient: Please reduce insulin to 40 units bid

## 2013-07-15 ENCOUNTER — Ambulatory Visit (INDEPENDENT_AMBULATORY_CARE_PROVIDER_SITE_OTHER): Payer: Medicare Other | Admitting: Endocrinology

## 2013-07-15 ENCOUNTER — Encounter: Payer: Self-pay | Admitting: Endocrinology

## 2013-07-15 VITALS — BP 120/80 | HR 89 | Temp 97.7°F | Ht 69.0 in | Wt 319.0 lb

## 2013-07-15 DIAGNOSIS — E1049 Type 1 diabetes mellitus with other diabetic neurological complication: Secondary | ICD-10-CM

## 2013-07-15 NOTE — Patient Instructions (Addendum)
Please make a follow-up appointment in 3 months.   On this type of insulin schedule, you should eat meals on a regular schedule.  If a meal is missed or significantly delayed, your blood sugar could go low. check your blood sugar 2 times a day.  vary the time of day when you check, between before the 3 meals, and at bedtime.  also check if you have symptoms of your blood sugar being too high or too low.  please keep a record of the readings and bring it to your next appointment here.  please call us sooner if your blood sugar goes below 70, or if it stays over 200.  Please stop taking the metformin.

## 2013-07-15 NOTE — Progress Notes (Signed)
Subjective:    Patient ID: [name], female    DOB: 04/21/1951, 63 y.o.   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  no cbg record, but states cbg's are well-controlled.  She has been taking 40 units, bid. Past Medical History  Diagnosis Date  . HYPERLIPIDEMIA 01/11/2009  . DEPRESSION 09/26/2008  . HYPERTENSION 10/06/2006  . DEGENERATIVE JOINT DISEASE 10/06/2006  . Pain in joint, multiple sites 11/10/2006  . INSOMNIA 09/26/2008  . CHEST PAIN 11/18/2007  . Diverticulosis     0174,9449  . Internal hemorrhoids   . OBSTRUCTIVE SLEEP APNEA 06/23/2008    Severe OSA per sleep study 2010, Rx a CPAP  . DIABETES MELLITUS 10/06/2006  . UNSPECIFIED ANEMIA 12/10/2009    Past Surgical History  Procedure Laterality Date  . Right knee replacement  2005  . 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 Colonoscopy  08/01/2011    Procedure: COLONOSCOPY;  Surgeon: Inda Castle, MD;  Location: [facility];  Service: Endoscopy;  Laterality: N/A;    History   Social History  . Marital Status: Widowed    Spouse Name: N/A    Number of Children: 4  . Years of Education: N/A   Occupational History  . disability    Social History Main Topics  . Smoking status: Former Smoker -- 0.20 packs/day for 4 years    Types: Cigarettes    Quit date: 05/13/1995  . Smokeless tobacco: Never Used  . Alcohol Use: Yes     Comment: Rarely  . Drug Use: No  . Sexual Activity: Not on file   Other Topics Concern  . Not on file   Social History Narrative   Widow     Current Outpatient Prescriptions on File Prior to Visit  Medication Sig Dispense Refill  . ACCU-CHEK FASTCLIX LANCETS  MISC TEST TWICE A DAY  102 each  12  . aspirin 325 MG tablet Take 325 mg by mouth daily.        . BD ULTRA-FINE PEN NEEDLES 29G X 12.7MM MISC use with HUMALOG KWIKPEN  100 each  4  . Blood Glucose Monitoring Suppl (ACCU-CHEK AVIVA PLUS) W/DEVICE KIT 1 Device by Does not apply route once.  1 kit  0  . budesonide-formoterol (SYMBICORT) 160-4.5 MCG/ACT inhaler Inhale 2 puffs into the lungs 2 (two) times daily as needed (cough or wheezing).      . carvedilol (COREG) 25 MG tablet take 1/2 tablet by mouth twice a day      . carvedilol (COREG) 25 MG tablet take 1 tablet by mouth twice a day  180 tablet  1  . cloNIDine (CATAPRES) 0.2 MG tablet take 1 tablet by mouth twice a day  180 tablet  1  . doxycycline (VIBRA-TABS) 100 MG tablet Take 1 tablet (100 mg total) by mouth 2 (two) times daily.  20 tablet  0  . ergocalciferol (VITAMIN D2) 50000 UNITS capsule Take 1 capsule (50,000 Units total) by mouth once a week.  12 capsule  0  . glucose blood (ACCU-CHEK AVIVA PLUS) test strip TEST twice a day  100 each  12  . insulin lispro protamine-lispro (  HUMALOG 50/50) (50-50) 100 UNIT/ML SUSP injection Inject 44 Units into the skin 2 (two) times daily before a meal.      . Insulin Pen Needle 29G X 12MM MISC Use as directed three times daily  100 each  3  . omeprazole (PRILOSEC) 20 MG capsule Take 1 capsule (20 mg total) by mouth daily.  30 capsule  3  . potassium chloride (K-DUR,KLOR-CON) 10 MEQ tablet take 1 tablet by mouth once daily  90 tablet  1  . rosuvastatin (CRESTOR) 40 MG tablet Take 1 tablet (40 mg total) by mouth daily.  30 tablet  2  . TAZTIA XT 360 MG 24 hr capsule take 1 capsule by mouth once daily  90 capsule  1  . telmisartan (MICARDIS) 40 MG tablet take 1 tablet by mouth once daily  90 tablet  1  . zoster vaccine live, PF, (ZOSTAVAX) 54360 UNT/0.65ML injection Inject 19,400 Units into the skin once.  1 each  0  . albuterol (PROVENTIL HFA;VENTOLIN HFA) 108 (90 BASE) MCG/ACT inhaler Inhale 2 puffs  into the lungs every 4 (four) hours as needed for wheezing.  1 Inhaler  0   No current facility-administered medications on file prior to visit.    No Known Allergies  Family History  Problem Relation Age of Onset  . Asthma Mother   . Stroke Mother   . Diabetes Other     M, B, S  . Hypertension Sister     M, S,B  . Pancreatic cancer Brother   . Colon cancer Neg Hx   . Prostate cancer Neg Hx   . Breast cancer Neg Hx     BP 120/80  Pulse 89  Temp(Src) 97.7 F (36.5 C) (Oral)  Ht _0  (1.753 m)  Wt 319 lb (144.697 kg)  BMI 47.09 kg/m2  SpO2 99%  Review of Systems She denies hypoglycemia and weight change.    Objective:   Physical Exam VITAL SIGNS:  See vs page GENERAL: no distress   outside test results are reviewed: A1c=6.2    Assessment & Plan:  DM: This insulin regimen was chosen from multiple options, due to her need for simplicity.  The benefits of glycemic control must be weighed against the risks of hypoglycemia.  Overcontrolled, due to this regimen, which does match insulin to her changing needs throughout the day. Depression: this often complicates the rx of DM, but she has not had recent problems with this.

## 2013-07-18 ENCOUNTER — Other Ambulatory Visit: Payer: Self-pay

## 2013-07-18 MED ORDER — ACCU-CHEK FASTCLIX LANCETS MISC: Status: DC

## 2013-07-25 ENCOUNTER — Ambulatory Visit (INDEPENDENT_AMBULATORY_CARE_PROVIDER_SITE_OTHER): Payer: Medicare Other | Admitting: Internal Medicine

## 2013-07-25 ENCOUNTER — Ambulatory Visit: Payer: PRIVATE HEALTH INSURANCE | Admitting: Internal Medicine

## 2013-07-25 ENCOUNTER — Encounter: Payer: Self-pay | Admitting: Internal Medicine

## 2013-07-25 VITALS — BP 129/83 | HR 85 | Temp 98.5°F | Wt 315.0 lb

## 2013-07-25 DIAGNOSIS — E785 Hyperlipidemia, unspecified: Secondary | ICD-10-CM

## 2013-07-25 DIAGNOSIS — K219 Gastro-esophageal reflux disease without esophagitis: Secondary | ICD-10-CM

## 2013-07-25 DIAGNOSIS — I10 Essential (primary) hypertension: Secondary | ICD-10-CM

## 2013-07-25 DIAGNOSIS — J9801 Acute bronchospasm: Secondary | ICD-10-CM

## 2013-07-25 HISTORY — DX: Gastro-esophageal reflux disease without esophagitis: K21.9

## 2013-07-25 NOTE — Assessment & Plan Note (Addendum)
Seems well controlled, patient not sure how much coreg she is on, recommend 25 mg tab, 1/2 tab bid Mild lower extremity edema, on calcium channel blockers, recommend leg elevation. Low-salt diet. check a CMP. ? discontinue calcium channel blockers if edema worsens

## 2013-07-25 NOTE — Progress Notes (Signed)
Subjective:    Patient ID: [name], female    DOB: 27-Nov-1950, 63 y.o.   MRN: [id number]  DOS:  07/25/2013 Type of  visit: ROV Lower extremity edema, worse on the right, it was quite noticeable a week ago, better this week. Denies taking Motrin or any other new medicines, admits to eating more salt. History of asthma, needing albuterol 3 or 4 times a week as needed. Hypertension --BP today is great, reports normal ambulatory BPs. Carvedilol dose? GERD symptoms, well-controlled.  ROS Denies palpitations, chest pain at baseline (reports a history of chest pain on and off for years) Denies nausea, vomiting, diarrhea or blood in the stools.  Past Medical History  Diagnosis Date  . HYPERLIPIDEMIA 01/11/2009  . DEPRESSION 09/26/2008  . HYPERTENSION 10/06/2006  . DEGENERATIVE JOINT DISEASE 10/06/2006  . Pain in joint, multiple sites 11/10/2006  . INSOMNIA 09/26/2008  . CHEST PAIN 11/18/2007  . Diverticulosis     5038,8828  . Internal hemorrhoids   . OBSTRUCTIVE SLEEP APNEA 06/23/2008    Severe OSA per sleep study 2010, Rx a CPAP  . DIABETES MELLITUS 10/06/2006  . UNSPECIFIED ANEMIA 12/10/2009  . GERD (gastroesophageal reflux disease) 07/25/2013    Past Surgical History  Procedure Laterality Date  . Right knee replacement  2005  . 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 Colonoscopy  08/01/2011    Procedure: COLONOSCOPY;  Surgeon: Inda Castle, MD;  Location: [facility];  Service: Endoscopy;  Laterality: N/A;    History   Social History  . Marital Status: Widowed    Spouse Name: N/A    Number of Children: 4  . Years of Education: N/A   Occupational History  . disability    Social History Main Topics  . Smoking status: Former Smoker -- 0.20 packs/day for 4 years    Types: Cigarettes    Quit date: 05/13/1995  . Smokeless tobacco: Never Used  . Alcohol Use: Yes     Comment: Rarely  . Drug Use: No  . Sexual Activity: Not on file   Other Topics Concern  . Not on file    Social History Narrative   Widow         Medication List       This list is accurate as of: 07/25/13  6:11 PM.  Always use your most recent med list.               ACCU-CHEK AVIVA PLUS W/DEVICE Kit  1 Device by Does not apply route once.     ACCU-CHEK FASTCLIX LANCETS Misc  TEST TWICE A DAY     albuterol 108 (90 BASE) MCG/ACT inhaler  Commonly known as:  PROVENTIL HFA;VENTOLIN HFA  Inhale 2 puffs into the lungs every 4 (four) hours as needed for wheezing.     aspirin 325 MG tablet  Take 325 mg by mouth daily.     budesonide-formoterol 160-4.5 MCG/ACT inhaler  Commonly known as:  SYMBICORT  Inhale 2 puffs into the lungs 2 (two) times daily as needed (cough or wheezing).     carvedilol 25 MG tablet  Commonly known as:  COREG  take 1/2 tablet by mouth twice a day     cloNIDine 0.2 MG tablet  Commonly known as:  CATAPRES  take 1 tablet by mouth twice a day     glucose blood test strip  Commonly known as:  ACCU-CHEK AVIVA PLUS  TEST twice a day     insulin lispro protamine-lispro (  50-50) 100 UNIT/ML Susp injection  Commonly known as:  HUMALOG 50/50 MIX  Inject 44 Units into the skin 2 (two) times daily before a meal.     Insulin Pen Needle 29G X 12MM Misc  Use as directed three times daily     BD ULTRA-FINE PEN NEEDLES 29G X 12.7MM Misc  Generic drug:  Insulin Pen Needle  use with HUMALOG KWIKPEN     omeprazole 20 MG capsule  Commonly known as:  PRILOSEC  Take 1 capsule (20 mg total) by mouth daily.     potassium chloride 10 MEQ tablet  Commonly known as:  K-DUR,KLOR-CON  take 1 tablet by mouth once daily     rosuvastatin 40 MG tablet  Commonly known as:  CRESTOR  Take 1 tablet (40 mg total) by mouth daily.     TAZTIA XT 360 MG 24 hr capsule  Generic drug:  diltiazem  take 1 capsule by mouth once daily     telmisartan 40 MG tablet  Commonly known as:  MICARDIS  take 1 tablet by mouth once daily           Objective:   Physical Exam BP  129/83  Pulse 85  Temp(Src) 98.5 F (36.9 C)  Wt 315 lb (142.883 kg)  SpO2 95% General -- alert, well-developed, NAD.  HEENT-- Not pale.  Lungs -- normal respiratory effort, no intercostal retractions, no accessory muscle use, and normal breath sounds.  Heart-- normal rate, regular rhythm, no murmur.   Extremities-- +/+++ pretibial edema bilaterally  Neurologic--  alert & oriented X3. Speech normal, gait normal, strength normal in all extremities.  Psych-- Cognition and judgment appear intact. Cooperative with normal attention span and concentration. No anxious or depressed appearing.     Assessment & Plan:

## 2013-07-25 NOTE — Assessment & Plan Note (Signed)
On PPIs as needed, doing well

## 2013-07-25 NOTE — Patient Instructions (Signed)
Get your blood work before you leave   Next visit is for a physical exam in 3-4 months , fasting Please make an appointment    

## 2013-07-25 NOTE — Assessment & Plan Note (Addendum)
Currently well controlled, Uses albuterol 3 or 4 times a week,no change

## 2013-07-25 NOTE — Progress Notes (Signed)
Pre visit review using our clinic review tool, if applicable. No additional management support is needed unless otherwise documented below in the visit note. 

## 2013-07-25 NOTE — Assessment & Plan Note (Signed)
Last cholesterol panel very good, check LFTs. No change

## 2013-07-26 LAB — COMPREHENSIVE METABOLIC PANEL WITH GFR
ALT: 14 U/L (ref 0–35)
AST: 18 U/L (ref 0–37)
Albumin: 3.9 g/dL (ref 3.5–5.2)
Alkaline Phosphatase: 69 U/L (ref 39–117)
BUN: 18 mg/dL (ref 6–23)
CO2: 28 meq/L (ref 19–32)
Calcium: 9.4 mg/dL (ref 8.4–10.5)
Chloride: 104 meq/L (ref 96–112)
Creatinine, Ser: 1 mg/dL (ref 0.4–1.2)
GFR: 76.09 mL/min
Glucose, Bld: 106 mg/dL — ABNORMAL HIGH (ref 70–99)
Potassium: 4.1 meq/L (ref 3.5–5.1)
Sodium: 139 meq/L (ref 135–145)
Total Bilirubin: 0.4 mg/dL (ref 0.3–1.2)
Total Protein: 7.8 g/dL (ref 6.0–8.3)

## 2013-08-04 ENCOUNTER — Other Ambulatory Visit: Payer: Self-pay

## 2013-08-04 MED ORDER — INSULIN PEN NEEDLE 29G X 12.7MM MISC: Status: DC

## 2013-08-08 ENCOUNTER — Other Ambulatory Visit: Payer: Self-pay

## 2013-08-08 ENCOUNTER — Telehealth: Payer: Self-pay | Admitting: Endocrinology

## 2013-08-08 MED ORDER — GLUCOSE BLOOD VI STRP: ORAL_STRIP | Status: DC

## 2013-08-08 NOTE — Telephone Encounter (Signed)
Pt has called 2 times today regarding her Rx of test strips to be called in to her pharmacy  As Jinny Blossom and I discussed the order was sent to [facility] on [address] on 07/20/13 Pt states she called [facility] and there was no order sent, pt stressed that she has not tested since sat   Thank You :)

## 2013-08-08 NOTE — Telephone Encounter (Signed)
Test strips sent to pharmacy.

## 2013-08-09 ENCOUNTER — Encounter: Payer: Self-pay | Admitting: Internal Medicine

## 2013-08-15 ENCOUNTER — Encounter: Payer: Self-pay | Admitting: Gastroenterology

## 2013-08-17 ENCOUNTER — Other Ambulatory Visit: Payer: Self-pay | Admitting: *Deleted

## 2013-08-17 MED ORDER — ROSUVASTATIN CALCIUM 40 MG PO TABS
40.0000 mg | ORAL_TABLET | Freq: Every day | ORAL | Status: DC
Start: 2013-08-17 — End: 2013-11-16

## 2013-09-05 ENCOUNTER — Other Ambulatory Visit: Payer: Self-pay

## 2013-09-05 DIAGNOSIS — Z1231 Encounter for screening mammogram for malignant neoplasm of breast: Secondary | ICD-10-CM

## 2013-09-12 ENCOUNTER — Other Ambulatory Visit: Payer: Self-pay | Admitting: Internal Medicine

## 2013-09-12 ENCOUNTER — Telehealth: Payer: Self-pay | Admitting: *Deleted

## 2013-09-12 DIAGNOSIS — J441 Chronic obstructive pulmonary disease with (acute) exacerbation: Secondary | ICD-10-CM

## 2013-09-12 NOTE — Telephone Encounter (Signed)
Refill for Symbicort sent to [facility] on [address]

## 2013-09-12 NOTE — Telephone Encounter (Signed)
[place] [facility] - care/disease management program requesting notes/labs . Faxed 09/12/13 .

## 2013-09-14 ENCOUNTER — Encounter (INDEPENDENT_AMBULATORY_CARE_PROVIDER_SITE_OTHER): Payer: Self-pay

## 2013-09-14 ENCOUNTER — Ambulatory Visit
Admission: RE | Admit: 2013-09-14 | Discharge: 2013-09-14 | Disposition: A | Discharge status: Home / Self Care | Payer: Medicare Other | Source: Ambulatory Visit

## 2013-09-14 DIAGNOSIS — Z1231 Encounter for screening mammogram for malignant neoplasm of breast: Secondary | ICD-10-CM

## 2013-09-23 ENCOUNTER — Other Ambulatory Visit: Payer: Self-pay | Admitting: Internal Medicine

## 2013-10-06 LAB — HM DIABETES EYE EXAM

## 2013-10-17 ENCOUNTER — Encounter: Payer: Self-pay | Admitting: Endocrinology

## 2013-10-17 ENCOUNTER — Ambulatory Visit (INDEPENDENT_AMBULATORY_CARE_PROVIDER_SITE_OTHER): Payer: Medicare Other | Admitting: Endocrinology

## 2013-10-17 VITALS — BP 122/80 | HR 85 | Temp 98.0°F | Ht 69.0 in | Wt 316.0 lb

## 2013-10-17 DIAGNOSIS — R0609 Other forms of dyspnea: Secondary | ICD-10-CM

## 2013-10-17 DIAGNOSIS — R0989 Other specified symptoms and signs involving the circulatory and respiratory systems: Secondary | ICD-10-CM

## 2013-10-17 DIAGNOSIS — E1049 Type 1 diabetes mellitus with other diabetic neurological complication: Secondary | ICD-10-CM

## 2013-10-17 LAB — MICROALBUMIN / CREATININE URINE RATIO
Creatinine,U: 113.4 mg/dL
MICROALB/CREAT RATIO: 1.4 mg/g (ref 0.0–30.0)
Microalb, Ur: 1.6 mg/dL (ref 0.0–1.9)

## 2013-10-17 LAB — BRAIN NATRIURETIC PEPTIDE: Pro B Natriuretic peptide (BNP): 40 pg/mL (ref 0.0–100.0)

## 2013-10-17 LAB — HEMOGLOBIN A1C: Hgb A1c MFr Bld: 7.3 % — ABNORMAL HIGH (ref 4.6–6.5)

## 2013-10-17 NOTE — Patient Instructions (Addendum)
Please make a follow-up appointment in 3 months.   On this type of insulin schedule, you should eat meals on a regular schedule.  If a meal is missed or significantly delayed, your blood sugar could go low. check your blood sugar 2 times a day.  vary the time of day when you check, between before the 3 meals, and at bedtime.  also check if you have symptoms of your blood sugar being too high or too low.  please keep a record of the readings and bring it to your next appointment here.  please call us sooner if your blood sugar goes below 70, or if it stays over 200.  Blood and urine tests are requested for you today.  We'll contact you with results.

## 2013-10-17 NOTE — Progress Notes (Signed)
Subjective:    Patient ID: [name], female    DOB: 22-Jan-1951, 63 y.o.   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  She has been taking 43 units, bid.  She denies hypoglycemia.  she brings a record of her cbg's which i have reviewed today.  It varies from 100-250, but most are in the 100's.  There is no trend throughout the day.   Past Medical History  Diagnosis Date  . HYPERLIPIDEMIA 01/11/2009  . DEPRESSION 09/26/2008  . HYPERTENSION 10/06/2006  . DEGENERATIVE JOINT DISEASE 10/06/2006  . Pain in joint, multiple sites 11/10/2006  . INSOMNIA 09/26/2008  . CHEST PAIN 11/18/2007  . Diverticulosis     [id number]  . Internal hemorrhoids   . OBSTRUCTIVE SLEEP APNEA 06/23/2008    Severe OSA per sleep study 2010, Rx a CPAP  . DIABETES MELLITUS 10/06/2006  . UNSPECIFIED ANEMIA 12/10/2009  . GERD (gastroesophageal reflux disease) 07/25/2013    Past Surgical History  Procedure Laterality Date  . Right knee replacement  2005  . 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 Colonoscopy  08/01/2011    Procedure: COLONOSCOPY;  Surgeon: Inda Castle, MD;  Location: [facility];  Service: Endoscopy;  Laterality: N/A;    History   Social History  . Marital Status: Widowed    Spouse Name: N/A    Number of Children: 4  . Years of Education: N/A   Occupational History  . disability    Social History Main Topics  . Smoking status: Former Smoker -- 0.20 packs/day for 4 years    Types: Cigarettes    Quit date: 05/13/1995  . Smokeless tobacco: Never Used  . Alcohol Use: Yes     Comment: Rarely  . Drug Use: No  .  Sexual Activity: Not on file   Other Topics Concern  . Not on file   Social History Narrative   Widow     Current Outpatient Prescriptions on File Prior to Visit  Medication Sig Dispense Refill  . ACCU-CHEK FASTCLIX LANCETS MISC TEST TWICE A DAY  102 each  12  . aspirin 325 MG tablet Take 325 mg by mouth daily.        . Blood Glucose Monitoring Suppl (ACCU-CHEK AVIVA PLUS) W/DEVICE KIT 1 Device by Does not apply route once.  1 kit  0  . carvedilol (COREG) 25 MG tablet take 1/2 tablet by mouth twice a day      . cloNIDine (CATAPRES) 0.2 MG tablet take 1 tablet by mouth twice a day  180 tablet  1  . glucose blood (ACCU-CHEK AVIVA PLUS) test strip TEST twice a day  100 each  12  . insulin lispro protamine-lispro (HUMALOG 50/50) (50-50) 100 UNIT/ML SUSP injection Inject 43 Units into the skin 2 (two) times daily before a meal.       . Insulin Pen Needle (BD ULTRA-FINE PEN NEEDLES) 29G X 12.7MM MISC use with HUMALOG KWIKPEN  100 each  4  . Insulin Pen Needle 29G X 12MM MISC Use as directed three times daily  100 each  3  . omeprazole (PRILOSEC)  20 MG capsule Take 1 capsule (20 mg total) by mouth daily.  30 capsule  3  . potassium chloride (K-DUR,KLOR-CON) 10 MEQ tablet take 1 tablet by mouth once daily  90 tablet  1  . rosuvastatin (CRESTOR) 40 MG tablet Take 1 tablet (40 mg total) by mouth daily.  30 tablet  2  . SYMBICORT 160-4.5 MCG/ACT inhaler inhale 2 puffs by mouth twice a day  10.2 g  3  . TAZTIA XT 360 MG 24 hr capsule take 1 capsule by mouth once daily  90 capsule  1  . telmisartan (MICARDIS) 40 MG tablet take 1 tablet by mouth once daily  90 tablet  1  . albuterol (PROVENTIL HFA;VENTOLIN HFA) 108 (90 BASE) MCG/ACT inhaler Inhale 2 puffs into the lungs every 4 (four) hours as needed for wheezing.  1 Inhaler  0   No current facility-administered medications on file prior to visit.    No Known Allergies  Family History  Problem Relation Age of Onset  . Asthma Mother   . Stroke  Mother   . Diabetes Other     M, B, S  . Hypertension Sister     M, S,B  . Pancreatic cancer Brother   . Colon cancer Neg Hx   . Prostate cancer Neg Hx   . Breast cancer Neg Hx     BP 122/80  Pulse 85  Temp(Src) 98 F (36.7 C) (Oral)  Ht 5' 9" (1.753 m)  Wt 316 lb (143.337 kg)  BMI 46.64 kg/m2  SpO2 95%   Review of Systems Pt states edema, and slight doe.    Objective:   Physical Exam VITAL SIGNS:  See vs page GENERAL: no distress Pulses: dorsalis pedis intact bilat.  Feet: no deformity. feet are of normal color and temp.1+ bilat leg edema. There is bilateral onychomycosis  Skin: no ulcer on the feet.  Neuro: sensation is intact to touch on the feet, but decreased from normal.    Lab Results  Component Value Date   HGBA1C 7.3* 10/17/2013  BNP=normal    Assessment & Plan:  DM: this is the best control this pt should aim for, given this regimen, which does match insulin to her changing needs throughout the day. morbid obesity: this complicates the rx of DM: This impairs the ability to achieve glycemic control.  I'll work around this as best I can. Edema, moderate exacerbation: this BNP argues against fluid overload.     Patient is advised the following: Patient Instructions  Please make a follow-up appointment in 3 months.   On this type of insulin schedule, you should eat meals on a regular schedule.  If a meal is missed or significantly delayed, your blood sugar could go low. check your blood sugar 2 times a day.  vary the time of day when you check, between before the 3 meals, and at bedtime.  also check if you have symptoms of your blood sugar being too high or too low.  please keep a record of the readings and bring it to your next appointment here.  please call us sooner if your blood sugar goes below 70, or if it stays over 200.  Blood and urine tests are requested for you today.  We'll contact you with results.      

## 2013-10-19 ENCOUNTER — Encounter: Payer: Self-pay | Admitting: *Deleted

## 2013-10-21 ENCOUNTER — Encounter: Payer: Self-pay | Admitting: Internal Medicine

## 2013-10-25 ENCOUNTER — Telehealth: Payer: Self-pay | Admitting: *Deleted

## 2013-10-25 NOTE — Telephone Encounter (Signed)
Medication List and Allergies: Reviewed and updated  16 Day supply/Mail order:[facility] Local pharmacy: [facility] Immunizations Due:  A/P FH/PSH or Personal History: Reviewed and updated Flu Vaccine: 04/2013 Tdap:08/2006 PNA: 05/2012 Shingles: 09/2013 at [facility]: 07/2011 Polyps, Hemorrhoids repeat in 10 yrs MMG: 06/2009 negative BD: 09/2008 normal  To discuss with provider: Needs something for swelling in the feet.

## 2013-10-26 ENCOUNTER — Encounter: Payer: Self-pay | Admitting: Internal Medicine

## 2013-10-26 ENCOUNTER — Ambulatory Visit (INDEPENDENT_AMBULATORY_CARE_PROVIDER_SITE_OTHER): Payer: Medicare Other | Admitting: Internal Medicine

## 2013-10-26 VITALS — BP 116/74 | HR 81 | Temp 98.0°F | Ht 68.5 in | Wt 313.0 lb

## 2013-10-26 DIAGNOSIS — Z23 Encounter for immunization: Secondary | ICD-10-CM

## 2013-10-26 DIAGNOSIS — E119 Type 2 diabetes mellitus without complications: Secondary | ICD-10-CM

## 2013-10-26 DIAGNOSIS — J9801 Acute bronchospasm: Secondary | ICD-10-CM

## 2013-10-26 DIAGNOSIS — F3289 Other specified depressive episodes: Secondary | ICD-10-CM

## 2013-10-26 DIAGNOSIS — I10 Essential (primary) hypertension: Secondary | ICD-10-CM

## 2013-10-26 DIAGNOSIS — G4700 Insomnia, unspecified: Secondary | ICD-10-CM

## 2013-10-26 DIAGNOSIS — Z0000 Encounter for general adult medical examination without abnormal findings: Secondary | ICD-10-CM

## 2013-10-26 DIAGNOSIS — F329 Major depressive disorder, single episode, unspecified: Secondary | ICD-10-CM

## 2013-10-26 MED ORDER — CITALOPRAM HYDROBROMIDE 20 MG PO TABS: 20.0000 mg | ORAL_TABLET | Freq: Every day | ORAL | Status: DC

## 2013-10-26 MED ORDER — ALBUTEROL SULFATE HFA 108 (90 BASE) MCG/ACT IN AERS: 2.0000 | INHALATION_SPRAY | RESPIRATORY_TRACT | Status: DC | PRN

## 2013-10-26 NOTE — Assessment & Plan Note (Signed)
She is not sleeping well lately  will assess after depression is treated

## 2013-10-26 NOTE — Progress Notes (Signed)
Pre visit review using our clinic review tool, if applicable. No additional management support is needed unless otherwise documented below in the visit note. 

## 2013-10-26 NOTE — Assessment & Plan Note (Addendum)
Having a lot of stress due to the social situation of his 4 children. Patient is counseled. Recommend to see a counselor. Plan: Citalopram, see instructions

## 2013-10-26 NOTE — Patient Instructions (Signed)
Please come back fasting for blood work FLP --- hyperlipidemia BMP, CBC---- hypertension Vitamin D ----vitamin D deficiency UA-- nocturia   Start citalopram 20 mg: Half tablet daily for 2 weeks, then one tablet daily. Come back in 6 weeks. Consider  see one of our counselors  Takes Symbicort twice a day, if you have cough or wheezing use albuterol.       Diabetes and Foot Care Diabetes may cause you to have problems because of poor blood supply (circulation) to your feet and legs. This may cause the skin on your feet to become thinner, break easier, and heal more slowly. Your skin may become dry, and the skin may peel and crack. You may also have nerve damage in your legs and feet causing decreased feeling in them. You may not notice minor injuries to your feet that could lead to infections or more serious problems. Taking care of your feet is one of the most important things you can do for yourself.  HOME CARE INSTRUCTIONS  Wear shoes at all times, even in the house. Do not go barefoot. Bare feet are easily injured.  Check your feet daily for blisters, cuts, and redness. If you cannot see the bottom of your feet, use a mirror or ask someone for help.  Wash your feet with warm water (do not use hot water) and mild soap. Then pat your feet and the areas between your toes until they are completely dry. Do not soak your feet as this can dry your skin.  Apply a moisturizing lotion or petroleum jelly (that does not contain alcohol and is unscented) to the skin on your feet and to dry, brittle toenails. Do not apply lotion between your toes.  Trim your toenails straight across. Do not dig under them or around the cuticle. File the edges of your nails with an emery board or nail file.  Do not cut corns or calluses or try to remove them with medicine.  Wear clean socks or stockings every day. Make sure they are not too tight. Do not wear knee-high stockings since they may decrease blood flow  to your legs.  Wear shoes that fit properly and have enough cushioning. To break in new shoes, wear them for just a few hours a day. This prevents you from injuring your feet. Always look in your shoes before you put them on to be sure there are no objects inside.  Do not cross your legs. This may decrease the blood flow to your feet.  If you find a minor scrape, cut, or break in the skin on your feet, keep it and the skin around it clean and dry. These areas may be cleansed with mild soap and water. Do not cleanse the area with peroxide, alcohol, or iodine.  When you remove an adhesive bandage, be sure not to damage the skin around it.  If you have a wound, look at it several times a day to make sure it is healing.  Do not use heating pads or hot water bottles. They may burn your skin. If you have lost feeling in your feet or legs, you may not know it is happening until it is too late.  Make sure your health care provider performs a complete foot exam at least annually or more often if you have foot problems. Report any cuts, sores, or bruises to your health care provider immediately. SEEK MEDICAL CARE IF:   You have an injury that is not healing.  You  have cuts or breaks in the skin.  You have an ingrown nail.  You notice redness on your legs or feet.  You feel burning or tingling in your legs or feet.  You have pain or cramps in your legs and feet.  Your legs or feet are numb.  Your feet always feel cold. SEEK IMMEDIATE MEDICAL CARE IF:   There is increasing redness, swelling, or pain in or around a wound.  There is a red line that goes up your leg.  Pus is coming from a wound.  You develop a fever or as directed by your health care provider.  You notice a bad smell coming from an ulcer or wound. Document Released: 04/25/2000 Document Revised: 12/29/2012 Document Reviewed: 10/05/2012 [facility] Patient Information 2015 [place], [place]. This information is not intended to  replace advice given to you by your health care provider. Make sure you discuss any questions you have with your health care provider.   Fall Prevention and Home Safety Falls cause injuries and can affect all age groups. It is possible to use preventive measures to significantly decrease the likelihood of falls. There are many simple measures which can make your home safer and prevent falls. OUTDOORS  Repair cracks and edges of walkways and driveways.  Remove high doorway thresholds.  Trim shrubbery on the main path into your home.  Have good outside lighting.  Clear walkways of tools, rocks, debris, and clutter.  Check that handrails are not broken and are securely fastened. Both sides of steps should have handrails.  Have leaves, snow, and ice cleared regularly.  Use sand or salt on walkways during winter months.  In the garage, clean up grease or oil spills. BATHROOM  Install night lights.  Install grab bars by the toilet and in the tub and shower.  Use non-skid mats or decals in the tub or shower.  Place a plastic non-slip stool in the shower to sit on, if needed.  Keep floors dry and clean up all water on the floor immediately.  Remove soap buildup in the tub or shower on a regular basis.  Secure bath mats with non-slip, double-sided rug tape.  Remove throw rugs and tripping hazards from the floors. BEDROOMS  Install night lights.  Make sure a bedside light is easy to reach.  Do not use oversized bedding.  Keep a telephone by your bedside.  Have a firm chair with side arms to use for getting dressed.  Remove throw rugs and tripping hazards from the floor. KITCHEN  Keep handles on pots and pans turned toward the center of the stove. Use back burners when possible.  Clean up spills quickly and allow time for drying.  Avoid walking on wet floors.  Avoid hot utensils and knives.  Position shelves so they are not too high or low.  Place commonly used  objects within easy reach.  If necessary, use a sturdy step stool with a grab bar when reaching.  Keep electrical cables out of the way.  Do not use floor polish or wax that makes floors slippery. If you must use wax, use non-skid floor wax.  Remove throw rugs and tripping hazards from the floor. STAIRWAYS  Never leave objects on stairs.  Place handrails on both sides of stairways and use them. Fix any loose handrails. Make sure handrails on both sides of the stairways are as long as the stairs.  Check carpeting to make sure it is firmly attached along stairs. Make repairs to worn  or loose carpet promptly.  Avoid placing throw rugs at the top or bottom of stairways, or properly secure the rug with carpet tape to prevent slippage. Get rid of throw rugs, if possible.  Have an electrician put in a light switch at the top and bottom of the stairs. OTHER FALL PREVENTION TIPS  Wear low-heel or rubber-soled shoes that are supportive and fit well. Wear closed toe shoes.  When using a stepladder, make sure it is fully opened and both spreaders are firmly locked. Do not climb a closed stepladder.  Add color or contrast paint or tape to grab bars and handrails in your home. Place contrasting color strips on first and last steps.  Learn and use mobility aids as needed. Install an electrical emergency response system.  Turn on lights to avoid dark areas. Replace light bulbs that burn out immediately. Get light switches that glow.  Arrange furniture to create clear pathways. Keep furniture in the same place.  Firmly attach carpet with non-skid or double-sided tape.  Eliminate uneven floor surfaces.  Select a carpet pattern that does not visually hide the edge of steps.  Be aware of all pets. OTHER HOME SAFETY TIPS  Set the water temperature for 120 F (48.8 C).  Keep emergency numbers on or near the telephone.  Keep smoke detectors on every level of the home and near sleeping  areas. Document Released: 04/18/2002 Document Revised: 10/28/2011 Document Reviewed: 07/18/2011 [facility] Patient Information 2015 [place], [place]. This information is not intended to replace advice given to you by your health care provider. Make sure you discuss any questions you have with your health care provider.

## 2013-10-26 NOTE — Assessment & Plan Note (Signed)
Apparently she does not have any albuterol home, recommend to have in case she needs it, prescription sent

## 2013-10-26 NOTE — Progress Notes (Signed)
Subjective:    Patient ID: [name], female    DOB: 03-15-1951, 63 y.o.   MRN: [id number]  DOS:  10/26/2013 Type of  Visit:  Here for Medicare AWV:  1. Risk factors based on Past M, S, F history: reviewed 2. Physical Activities:  Sedentary other than some home chores 3. Depression/mood: + screening  4. Hearing:  No problemss noted or reported  5. ADL's:  Independent, doesn't know to drive 6. Fall Risk: increased , counseled   7. home Safety: does feel safe at home  8. Height, weight, & visual acuity: see VS, vision reportedly normal, sees the eye doctor , last visit few days ago 9. Counseling: provided 10. Labs ordered based on risk factors: if needed  11. Referral Coordination: if needed 12. Care Plan, see assessment and plan  13. Cognitive Assessment: Motor skills normal, cognition affected by low literacy-depression  In addition, today we discussed the following: Diabetes, closely follow up by endocrinology, had a recent negative eye check Bronchospasm, reports good compliance with Symbicort, does not know if she has albuterol at home Hypertension, good medication compliance, reports normal ambulatory BPs when checked but does not know of any readings. She had a positive screening for depression, reports a lot of stress due to issues w/  her 4 children .    ROS Denies nausea, vomiting, diarrhea or blood in the stools. No dysuria, gross hematuria or difficulty urinating. She reports urinates at night several times, this is going on for 2 years. She has chronic chest pain, usually at rest, at baseline. Also dyspnea on exertion at baseline. Admits to feet numbness. No burning type of pain  Past Medical History  Diagnosis Date  . HYPERLIPIDEMIA 01/11/2009  . DEPRESSION 09/26/2008  . HYPERTENSION 10/06/2006  . DEGENERATIVE JOINT DISEASE 10/06/2006  . Pain in joint, multiple sites 11/10/2006  . INSOMNIA 09/26/2008  . CHEST PAIN 11/18/2007  . Diverticulosis     [id number]    . Internal hemorrhoids   . OBSTRUCTIVE SLEEP APNEA 06/23/2008    Severe OSA per sleep study 2010, Rx a CPAP  . DIABETES MELLITUS 10/06/2006  . UNSPECIFIED ANEMIA 12/10/2009  . GERD (gastroesophageal reflux disease) 07/25/2013    Past Surgical History  Procedure Laterality Date  . Right knee replacement  2005  . 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 Colonoscopy  08/01/2011    Procedure: COLONOSCOPY;  Surgeon: Inda Castle, MD;  Location: [facility];  Service: Endoscopy;  Laterality: N/A;    History   Social History  . Marital Status: Widowed    Spouse Name: N/A    Number of Children: 4  . Years of Education: N/A   Occupational History  . disability    Social History Main Topics  . Smoking status: Former Smoker -- 0.20 packs/day for 4 years    Types: Cigarettes    Quit date: 05/13/1995  . Smokeless tobacco: Never Used  . Alcohol Use: Yes     Comment: Rarely  . Drug Use: No  . Sexual Activity: Not on file   Other Topics Concern  . Not on file   Social History Narrative   Widow , lives by herself     Family History  Problem Relation Age of Onset  . Asthma Mother   . Stroke Mother   . Diabetes Other     M, B, S  . Hypertension Sister     M, S,B  . Pancreatic cancer Brother   . Colon  cancer Neg Hx   . Prostate cancer Neg Hx   . Breast cancer Neg Hx       Medication List       This list is accurate as of: 10/26/13 11:59 PM.  Always use your most recent med list.               ACCU-CHEK AVIVA PLUS W/DEVICE Kit  1 Device by Does not apply route once.     ACCU-CHEK FASTCLIX LANCETS Misc  TEST TWICE A DAY     albuterol 108 (90 BASE) MCG/ACT inhaler  Commonly known as:  PROVENTIL HFA;VENTOLIN HFA  Inhale 2 puffs into the lungs every 4 (four) hours as needed for wheezing.     aspirin 325 MG tablet  Take 325 mg by mouth daily.     carvedilol 25 MG tablet  Commonly known as:  COREG  take 1/2 tablet by mouth twice a day     citalopram 20 MG tablet   Commonly known as:  CELEXA  Take 1 tablet (20 mg total) by mouth daily.     cloNIDine 0.2 MG tablet  Commonly known as:  CATAPRES  take 1 tablet by mouth twice a day     glucose blood test strip  Commonly known as:  ACCU-CHEK AVIVA PLUS  TEST twice a day     insulin lispro protamine-lispro (50-50) 100 UNIT/ML Susp injection  Commonly known as:  HUMALOG 50/50 MIX  Inject 43 Units into the skin 2 (two) times daily before a meal.     Insulin Pen Needle 29G X 12MM Misc  Use as directed three times daily     Insulin Pen Needle 29G X 12.7MM Misc  Commonly known as:  BD ULTRA-FINE PEN NEEDLES  use with HUMALOG KWIKPEN     omeprazole 20 MG capsule  Commonly known as:  PRILOSEC  Take 1 capsule (20 mg total) by mouth daily.     potassium chloride 10 MEQ tablet  Commonly known as:  K-DUR,KLOR-CON  take 1 tablet by mouth once daily     rosuvastatin 40 MG tablet  Commonly known as:  CRESTOR  Take 1 tablet (40 mg total) by mouth daily.     SYMBICORT 160-4.5 MCG/ACT inhaler  Generic drug:  budesonide-formoterol  inhale 2 puffs by mouth twice a day     TAZTIA XT 360 MG 24 hr capsule  Generic drug:  diltiazem  take 1 capsule by mouth once daily     telmisartan 40 MG tablet  Commonly known as:  MICARDIS  take 1 tablet by mouth once daily           Objective:   Physical Exam BP 116/74  Pulse 81  Temp(Src) 98 F (36.7 C)  Ht 5' 8.5" (1.74 m)  Wt 313 lb (141.976 kg)  BMI 46.89 kg/m2  SpO2 96% General -- alert, well-developed, NAD.  Neck --no thyromegaly  HEENT-- Not pale.  Lungs -- normal respiratory effort, no intercostal retractions, no accessory muscle use, and normal breath sounds.  Heart-- normal rate, regular rhythm, no murmur.  Abdomen-- Not distended, good bowel sounds,soft, non-tender. DIABETIC FEET EXAM: Trace  lower extremity edema Normal pedal pulses bilaterally Skin normal, nails too long Pinprick examination of the feet normal. Neurologic--  alert &  oriented X3. Speech normal, gait appropriate for age, strength symmetric and appropriate for age.   Psych-- Cognition and judgment appear intact. Cooperative with normal attention span and concentration. tearful     Assessment &  Plan:  History of vitamin D deficiency, labs History of nocturia, check a UA

## 2013-10-26 NOTE — Assessment & Plan Note (Signed)
Diabetes under the care of endocrinology. Eye exam 09/2013 negative Feet exam with normal pinprick examination however she reports subjective numbness, she has neuropathy. Feet care discussed

## 2013-10-26 NOTE — Assessment & Plan Note (Signed)
Chart and pertinent labs reviewed  BP seems to be well-controlled, check a BMP

## 2013-10-26 NOTE — Assessment & Plan Note (Addendum)
Tdap 1-14 Pneumonia 1-14 prevnar  Provided 10/2013 zostavax rx provided before   PAP per Dr Etter Sjogren , last 12-2011 (-) MMG [phone] (-) Cscope 2013, next 10 years Diet-exercise discussed  DEXA (-) 2010--- repeat DEXA

## 2013-10-27 ENCOUNTER — Telehealth: Payer: Self-pay | Admitting: Internal Medicine

## 2013-10-27 NOTE — Telephone Encounter (Signed)
Relevant patient education mailed to patient.  

## 2013-11-09 ENCOUNTER — Other Ambulatory Visit: Payer: Self-pay | Admitting: Internal Medicine

## 2013-11-16 ENCOUNTER — Other Ambulatory Visit: Payer: Self-pay

## 2013-11-16 MED ORDER — ROSUVASTATIN CALCIUM 40 MG PO TABS: 40.0000 mg | ORAL_TABLET | Freq: Every day | ORAL | Status: DC

## 2013-11-17 ENCOUNTER — Telehealth: Payer: Self-pay | Admitting: Internal Medicine

## 2013-11-17 ENCOUNTER — Telehealth: Payer: Self-pay | Admitting: Endocrinology

## 2013-11-17 NOTE — Telephone Encounter (Signed)
Patient stated that she has a bone density test Monday 13th, she also stated that she can not take any meds with calcium in it. Can you give her a list of her med that has calcium it. It will be greatly appreciated. Thank You

## 2013-11-17 NOTE — Telephone Encounter (Deleted)
Patient is having a bone densty

## 2013-11-17 NOTE — Telephone Encounter (Signed)
Caller name:[name] Relation to pt: patient Call back number: [phone] Pharmacy:  Reason for call: Patient stated that she is having a bone density test done on Monday and need to know what medications does she take with calcium in it. Please advise.

## 2013-11-17 NOTE — Telephone Encounter (Signed)
Called pt and advised her to direct her question to Dr. Larose Kells since he was the ordering physician. Pt states that she will contact his office tomorrow concerning bone density scan.

## 2013-11-17 NOTE — Telephone Encounter (Signed)
Spoke with patient and advised her that none of the medications on her current list contain calcium.

## 2013-11-21 ENCOUNTER — Ambulatory Visit (HOSPITAL_COMMUNITY)
Admission: RE | Admit: 2013-11-21 | Discharge: 2013-11-21 | Disposition: A | Discharge status: Home / Self Care | Payer: PRIVATE HEALTH INSURANCE | Source: Ambulatory Visit | Attending: Internal Medicine | Admitting: Internal Medicine

## 2013-11-21 DIAGNOSIS — Z0000 Encounter for general adult medical examination without abnormal findings: Secondary | ICD-10-CM

## 2013-11-21 DIAGNOSIS — Z13820 Encounter for screening for osteoporosis: Secondary | ICD-10-CM | POA: Insufficient documentation

## 2013-11-21 DIAGNOSIS — Z780 Asymptomatic menopausal state: Secondary | ICD-10-CM | POA: Insufficient documentation

## 2013-11-23 ENCOUNTER — Encounter: Payer: Self-pay | Admitting: *Deleted

## 2013-11-30 ENCOUNTER — Encounter: Payer: Self-pay | Admitting: Internal Medicine

## 2013-11-30 ENCOUNTER — Ambulatory Visit (INDEPENDENT_AMBULATORY_CARE_PROVIDER_SITE_OTHER): Payer: Medicare Other | Admitting: Internal Medicine

## 2013-11-30 VITALS — BP 189/90 | HR 94 | Temp 97.9°F | Wt 304.0 lb

## 2013-11-30 DIAGNOSIS — I10 Essential (primary) hypertension: Secondary | ICD-10-CM

## 2013-11-30 DIAGNOSIS — R351 Nocturia: Secondary | ICD-10-CM

## 2013-11-30 DIAGNOSIS — E785 Hyperlipidemia, unspecified: Secondary | ICD-10-CM

## 2013-11-30 DIAGNOSIS — E559 Vitamin D deficiency, unspecified: Secondary | ICD-10-CM

## 2013-11-30 DIAGNOSIS — F3289 Other specified depressive episodes: Secondary | ICD-10-CM

## 2013-11-30 DIAGNOSIS — F329 Major depressive disorder, single episode, unspecified: Secondary | ICD-10-CM

## 2013-11-30 DIAGNOSIS — E119 Type 2 diabetes mellitus without complications: Secondary | ICD-10-CM

## 2013-11-30 LAB — CBC WITH DIFFERENTIAL/PLATELET
Basophils Absolute: 0 10*3/uL (ref 0.0–0.1)
Basophils Relative: 0.3 % (ref 0.0–3.0)
EOS ABS: 0.1 10*3/uL (ref 0.0–0.7)
Eosinophils Relative: 1.2 % (ref 0.0–5.0)
HEMATOCRIT: 39.7 % (ref 36.0–46.0)
HEMOGLOBIN: 13 g/dL (ref 12.0–15.0)
LYMPHS PCT: 28.5 % (ref 12.0–46.0)
Lymphs Abs: 1.5 10*3/uL (ref 0.7–4.0)
MCHC: 32.8 g/dL (ref 30.0–36.0)
MCV: 88.8 fl (ref 78.0–100.0)
Monocytes Absolute: 0.3 10*3/uL (ref 0.1–1.0)
Monocytes Relative: 6.5 % (ref 3.0–12.0)
NEUTROS ABS: 3.3 10*3/uL (ref 1.4–7.7)
Neutrophils Relative %: 63.5 % (ref 43.0–77.0)
Platelets: 288 10*3/uL (ref 150.0–400.0)
RBC: 4.48 Mil/uL (ref 3.87–5.11)
RDW: 15 % (ref 11.5–15.5)
WBC: 5.2 10*3/uL (ref 4.0–10.5)

## 2013-11-30 LAB — BASIC METABOLIC PANEL
BUN: 12 mg/dL (ref 6–23)
CALCIUM: 9.6 mg/dL (ref 8.4–10.5)
CO2: 28 meq/L (ref 19–32)
Chloride: 104 mEq/L (ref 96–112)
Creatinine, Ser: 0.9 mg/dL (ref 0.4–1.2)
GFR: 80.9 mL/min (ref >60.00)
Glucose, Bld: 126 mg/dL — ABNORMAL HIGH (ref 70–99)
Potassium: 4 mEq/L (ref 3.5–5.1)
Sodium: 141 mEq/L (ref 135–145)

## 2013-11-30 LAB — LIPID PANEL
CHOL/HDL RATIO: 2
CHOLESTEROL: 152 mg/dL (ref 0–200)
HDL: 69.6 mg/dL (ref >39.00)
LDL Cholesterol: 63 mg/dL (ref 0–99)
NonHDL: 82.4
TRIGLYCERIDES: 95 mg/dL (ref 0.0–149.0)
VLDL: 19 mg/dL (ref 0.0–40.0)

## 2013-11-30 LAB — URINALYSIS, ROUTINE W REFLEX MICROSCOPIC
Bilirubin Urine: NEGATIVE
Hgb urine dipstick: NEGATIVE
KETONES UR: NEGATIVE
Nitrite: NEGATIVE
PH: 5.5 (ref 5.0–8.0)
SPECIFIC GRAVITY, URINE: 1.01 (ref 1.000–1.030)
Total Protein, Urine: NEGATIVE
UROBILINOGEN UA: 0.2 (ref 0.0–1.0)
Urine Glucose: NEGATIVE

## 2013-11-30 LAB — VITAMIN D 25 HYDROXY (VIT D DEFICIENCY, FRACTURES): VITD: 31.51 ng/mL (ref 30.00–100.00)

## 2013-11-30 NOTE — Assessment & Plan Note (Signed)
BP today Elevated, previous BPs in the office normal. Plan: Recommend to monitor her her BPs at home, see instructions, she verbalized understanding

## 2013-11-30 NOTE — Patient Instructions (Signed)
Get your blood work before you leave   Continue the same medicines   Check the  blood pressure 2 or 3 times a  week be sure it is between 110/60 and 140/85. Ideal blood pressure is 120/80. If it is consistently higher or lower, let me know   Next visit is for routine check up in 6 months  No need to come back fasting Please make an appointment

## 2013-11-30 NOTE — Assessment & Plan Note (Signed)
Good compliance with citalopram, symptoms are definitely improving. Plan: No change

## 2013-11-30 NOTE — Progress Notes (Signed)
Subjective:    Patient ID: [name], female    DOB: 1951/03/06, 63 y.o.   MRN: [id number]  DOS:  11/30/2013 Type of visit - description: f/u previous visit  History: The last time she was here, she was depressed, taking citalopram, states symptoms have definitely improved. Feeling better emotionally. Hypertension, BP elevated today, no recent ambulatory blood pressures, on chart review, previous BPs at the office okay.   ROS Denies nausea, vomiting. No suicidal ideas She tried to be more active and exercise more.  Past Medical History  Diagnosis Date  . HYPERLIPIDEMIA 01/11/2009  . DEPRESSION 09/26/2008  . HYPERTENSION 10/06/2006  . DEGENERATIVE JOINT DISEASE 10/06/2006  . Pain in joint, multiple sites 11/10/2006  . INSOMNIA 09/26/2008  . CHEST PAIN 11/18/2007  . Diverticulosis     9485,4627  . Internal hemorrhoids   . OBSTRUCTIVE SLEEP APNEA 06/23/2008    Severe OSA per sleep study 2010, Rx a CPAP  . DIABETES MELLITUS 10/06/2006  . UNSPECIFIED ANEMIA 12/10/2009  . GERD (gastroesophageal reflux disease) 07/25/2013    Past Surgical History  Procedure Laterality Date  . Right knee replacement  2005  . 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 Colonoscopy  08/01/2011    Procedure: COLONOSCOPY;  Surgeon: Inda Castle, MD;  Location: [facility];  Service: Endoscopy;  Laterality: N/A;    History   Social History  . Marital Status: Widowed    Spouse Name: N/A    Number of Children: 4  . Years of Education: N/A   Occupational History  . disability    Social History Main Topics  . Smoking status: Former Smoker -- 0.20 packs/day for 4 years    Types: Cigarettes    Quit date: 05/13/1995  . Smokeless tobacco: Never Used  . Alcohol Use: Yes     Comment: Rarely  . Drug Use: No  . Sexual Activity: Not on file   Other Topics Concern  . Not on file   Social History Narrative   Widow , lives by herself        Medication List       This list is accurate as of: 11/30/13  11:59 PM.  Always use your most recent med list.               ACCU-CHEK AVIVA PLUS W/DEVICE Kit  1 Device by Does not apply route once.     ACCU-CHEK FASTCLIX LANCETS Misc  TEST TWICE A DAY     albuterol 108 (90 BASE) MCG/ACT inhaler  Commonly known as:  PROVENTIL HFA;VENTOLIN HFA  Inhale 2 puffs into the lungs every 4 (four) hours as needed for wheezing.     aspirin 325 MG tablet  Take 325 mg by mouth daily.     carvedilol 25 MG tablet  Commonly known as:  COREG  take 1 tablet by mouth twice a day     citalopram 20 MG tablet  Commonly known as:  CELEXA  Take 1 tablet (20 mg total) by mouth daily.     cloNIDine 0.2 MG tablet  Commonly known as:  CATAPRES  take 1 tablet by mouth twice a day     glucose blood test strip  Commonly known as:  ACCU-CHEK AVIVA PLUS  TEST twice a day     insulin lispro protamine-lispro (50-50) 100 UNIT/ML Susp injection  Commonly known as:  HUMALOG 50/50 MIX  Inject 43 Units into the skin 2 (two) times daily before a meal.  Insulin Pen Needle 29G X 12MM Misc  Use as directed three times daily     Insulin Pen Needle 29G X 12.7MM Misc  Commonly known as:  BD ULTRA-FINE PEN NEEDLES  use with HUMALOG KWIKPEN     omeprazole 20 MG capsule  Commonly known as:  PRILOSEC  Take 1 capsule (20 mg total) by mouth daily.     potassium chloride 10 MEQ tablet  Commonly known as:  K-DUR,KLOR-CON  take 1 tablet by mouth once daily     rosuvastatin 40 MG tablet  Commonly known as:  CRESTOR  Take 1 tablet (40 mg total) by mouth daily.     SYMBICORT 160-4.5 MCG/ACT inhaler  Generic drug:  budesonide-formoterol  inhale 2 puffs by mouth twice a day     TAZTIA XT 360 MG 24 hr capsule  Generic drug:  diltiazem  take 1 capsule by mouth once daily     telmisartan 40 MG tablet  Commonly known as:  MICARDIS  take 1 tablet by mouth once daily           Objective:   Physical Exam BP 189/90  Pulse 94  Temp(Src) 97.9 F (36.6 C)  Wt 304 lb  (137.893 kg)  SpO2 94%  General -- alert, well-developed, NAD.   Neurologic--  alert & oriented X3. Speech normal, gait appropriate for age, strength symmetric and appropriate for age.  Psych-- Cognition and judgment appear intact. Cooperative with normal attention span and concentration. No anxious or depressed appearing.    Assessment & Plan:   Needs labs,not done since last OV FLP --- hyperlipidemia  BMP, CBC---- hypertension  Vitamin D ----vitamin D deficiency  UA-- nocturia

## 2013-11-30 NOTE — Progress Notes (Signed)
Pre visit review using our clinic review tool, if applicable. No additional management support is needed unless otherwise documented below in the visit note. 

## 2013-12-02 ENCOUNTER — Other Ambulatory Visit: Payer: Self-pay | Admitting: General Practice

## 2013-12-02 MED ORDER — CEPHALEXIN 500 MG PO CAPS: 500.0000 mg | ORAL_CAPSULE | Freq: Two times a day (BID) | ORAL | Status: DC

## 2014-01-02 ENCOUNTER — Ambulatory Visit (INDEPENDENT_AMBULATORY_CARE_PROVIDER_SITE_OTHER): Payer: Medicare Other | Admitting: Pulmonary Disease

## 2014-01-02 ENCOUNTER — Encounter: Payer: Self-pay | Admitting: Pulmonary Disease

## 2014-01-02 VITALS — BP 120/80 | HR 70 | Temp 97.9°F | Ht 69.0 in | Wt 309.2 lb

## 2014-01-02 DIAGNOSIS — G4733 Obstructive sleep apnea (adult) (pediatric): Secondary | ICD-10-CM

## 2014-01-02 NOTE — Progress Notes (Signed)
   Subjective:    Patient ID: [name], female    DOB: [birth year], 63 y.o.   MRN: [id number]  HPI The patient comes in today for followup of her obstructive sleep apnea. She has been completely CPAP intolerance, and we have been using oxygen at at bedtime in order to maintain saturations during the night. The patient has had ongoing issues with dryness and epistaxis, despite using a bubble bottle. Currently she is sleeping a lot during the day, and is not able to sleep at night.   Review of Systems  Constitutional: Negative for fever and unexpected weight change.  HENT: Positive for nosebleeds. Negative for congestion, dental problem, ear pain, postnasal drip, rhinorrhea, sinus pressure, sneezing, sore throat and trouble swallowing.   Eyes: Negative for redness and itching.  Respiratory: Negative for cough, chest tightness, shortness of breath and wheezing.   Cardiovascular: Negative for palpitations and leg swelling.  Gastrointestinal: Negative for nausea and vomiting.  Genitourinary: Negative for dysuria.  Musculoskeletal: Negative for joint swelling.  Skin: Negative for rash.  Neurological: Negative for headaches.  Hematological: Does not bruise/bleed easily.  Psychiatric/Behavioral: Negative for dysphoric mood. The patient is not nervous/anxious.        Objective:   Physical Exam Morbidly obese female in no acute distress Nose without purulence or discharge noted Neck without lymphadenopathy or thyromegaly Lower extremities with edema, no cyanosis Appears very sleepy, but appropriate, moves all 4 extremities.       Assessment & Plan:

## 2014-01-02 NOTE — Patient Instructions (Signed)
Continue on oxygen while sleeping, whether it occurs during day or night.  Can try AYR NASAL JELLY that is over the counter.  Can put in nose day or night to moisturize. Work on weight loss followup with me again in one year

## 2014-01-02 NOTE — Assessment & Plan Note (Signed)
The patient is wearing her oxygen intermittently, and continues to have issues with his nasal dryness and epistaxis despite using a bubble bottle. I have asked her to try using a nasal saline jelly to see if this will help with dryness. I have written down the name of the product for her that she can get over-the-counter. I've also stressed to her the importance of aggressive weight loss.

## 2014-01-17 ENCOUNTER — Encounter: Payer: Self-pay | Admitting: Endocrinology

## 2014-01-17 ENCOUNTER — Ambulatory Visit (INDEPENDENT_AMBULATORY_CARE_PROVIDER_SITE_OTHER): Payer: Medicare Other | Admitting: Endocrinology

## 2014-01-17 VITALS — BP 118/64 | HR 82 | Temp 98.1°F | Wt 309.0 lb

## 2014-01-17 DIAGNOSIS — E1049 Type 1 diabetes mellitus with other diabetic neurological complication: Secondary | ICD-10-CM

## 2014-01-17 LAB — HEMOGLOBIN A1C: HEMOGLOBIN A1C: 7.3 % — AB (ref 4.6–6.5)

## 2014-01-17 NOTE — Progress Notes (Signed)
Subjective:    Patient ID: [name], female    DOB: July 11, 1950, 63 y.o.   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  She has been taking 43 units, bid. she brings a record of her cbg's which i have reviewed today.  It is frequently mildly low (50's) at 1-2 am, or if lunch is delayed.   Past Medical History  Diagnosis Date  . HYPERLIPIDEMIA 01/11/2009  . DEPRESSION 09/26/2008  . HYPERTENSION 10/06/2006  . DEGENERATIVE JOINT DISEASE 10/06/2006  . Pain in joint, multiple sites 11/10/2006  . INSOMNIA 09/26/2008  . CHEST PAIN 11/18/2007  . Diverticulosis     1062,6948  . Internal hemorrhoids   . OBSTRUCTIVE SLEEP APNEA 06/23/2008    Severe OSA per sleep study 2010, Rx a CPAP  . DIABETES MELLITUS 10/06/2006  . UNSPECIFIED ANEMIA 12/10/2009  . GERD (gastroesophageal reflux disease) 07/25/2013    Past Surgical History  Procedure Laterality Date  . Right knee replacement  2005  . 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 Colonoscopy  08/01/2011    Procedure: COLONOSCOPY;  Surgeon: Inda Castle, MD;  Location: [facility];  Service: Endoscopy;  Laterality: N/A;    History   Social History  . Marital Status: Widowed    Spouse Name: N/A    Number of Children: 4  . Years of Education: N/A   Occupational History  . disability    Social History Main Topics  . Smoking status: Former Smoker -- 0.20 packs/day for 4 years    Types: Cigarettes    Quit date: 05/13/1995  . Smokeless tobacco: Never Used  . Alcohol Use: Yes     Comment: Rarely  . Drug Use: No  . Sexual Activity: Not on file   Other Topics  Concern  . Not on file   Social History Narrative   Widow , lives by herself    Current Outpatient Prescriptions on File Prior to Visit  Medication Sig Dispense Refill  . ACCU-CHEK FASTCLIX LANCETS MISC TEST TWICE A DAY  102 each  12  . albuterol (PROVENTIL HFA;VENTOLIN HFA) 108 (90 BASE) MCG/ACT inhaler Inhale 2 puffs into the lungs every 4 (four) hours as needed for wheezing.  1 Inhaler  0  . aspirin 325 MG tablet Take 325 mg by mouth daily.        . Blood Glucose Monitoring Suppl (ACCU-CHEK AVIVA PLUS) W/DEVICE KIT 1 Device by Does not apply route once.  1 kit  0  . carvedilol (COREG) 25 MG tablet take 1 tablet by mouth twice a day  180 tablet  1  . citalopram (CELEXA) 20 MG tablet Take 1 tablet (20 mg total) by mouth daily.  30 tablet  3  . cloNIDine (CATAPRES) 0.2 MG tablet take 1 tablet by mouth twice a day  180 tablet  1  . glucose blood (ACCU-CHEK AVIVA PLUS) test strip TEST twice a day  100 each  12  . insulin lispro protamine-lispro (HUMALOG 50/50) (50-50) 100 UNIT/ML SUSP injection Inject 41 Units into the skin 2 (two) times daily before a meal.       .  Insulin Pen Needle 29G X 12MM MISC Use as directed three times daily  100 each  3  . omeprazole (PRILOSEC) 20 MG capsule Take 1 capsule (20 mg total) by mouth daily.  30 capsule  3  . potassium chloride (K-DUR,KLOR-CON) 10 MEQ tablet take 1 tablet by mouth once daily  90 tablet  1  . rosuvastatin (CRESTOR) 40 MG tablet Take 1 tablet (40 mg total) by mouth daily.  30 tablet  2  . SYMBICORT 160-4.5 MCG/ACT inhaler inhale 2 puffs by mouth twice a day  10.2 g  3  . TAZTIA XT 360 MG 24 hr capsule take 1 capsule by mouth once daily  90 capsule  1  . telmisartan (MICARDIS) 40 MG tablet take 1 tablet by mouth once daily  90 tablet  1   No current facility-administered medications on file prior to visit.    No Known Allergies  Family History  Problem Relation Age of Onset  . Asthma Mother   . Stroke Mother   . Diabetes Other      M, B, S  . Hypertension Sister     M, S,B  . Pancreatic cancer Brother   . Colon cancer Neg Hx   . Prostate cancer Neg Hx   . Breast cancer Neg Hx     BP 118/64  Pulse 82  Temp(Src) 98.1 F (36.7 C) (Oral)  Wt 309 lb (140.161 kg)  SpO2 91%   Review of Systems Denies LOC.  She has lost a few lbs.      Objective:   Physical Exam VITAL SIGNS:  See vs page GENERAL: no distress Pulses: dorsalis pedis intact bilat.  Feet: no deformity. feet are of normal color and temp.1+ bilat leg edema. There is bilateral onychomycosis  Skin: no ulcer on the feet.  Neuro: sensation is intact to touch on the feet, but decreased from normal.  Lab Results  Component Value Date   HGBA1C 7.3* 01/17/2014       Assessment & Plan:  DM: mild exacerbation: this is the best control this pt should aim for, given this regimen, which does match insulin to her changing needs throughout the day Side-effect of rx, new: mild hypoglycemia.   Patient is advised the following: Patient Instructions  Please make a follow-up appointment in 3 months.   On this type of insulin schedule, you should eat meals on a regular schedule.  If a meal is missed or significantly delayed, your blood sugar could go low. check your blood sugar 2 times a day.  vary the time of day when you check, between before the 3 meals, and at bedtime.  also check if you have symptoms of your blood sugar being too high or too low.  please keep a record of the readings and bring it to your next appointment here.  please call us sooner if your blood sugar goes below 70, or if it stays over 200.   A diabetes blood test is requested for you today.  We'll contact you with results.   Based on the results, we'll reduce the insulin.

## 2014-01-17 NOTE — Patient Instructions (Addendum)
Please make a follow-up appointment in 3 months.   On this type of insulin schedule, you should eat meals on a regular schedule.  If a meal is missed or significantly delayed, your blood sugar could go low. check your blood sugar 2 times a day.  vary the time of day when you check, between before the 3 meals, and at bedtime.  also check if you have symptoms of your blood sugar being too high or too low.  please keep a record of the readings and bring it to your next appointment here.  please call us sooner if your blood sugar goes below 70, or if it stays over 200.   A diabetes blood test is requested for you today.  We'll contact you with results.   Based on the results, we'll reduce the insulin.

## 2014-01-31 ENCOUNTER — Other Ambulatory Visit: Payer: Self-pay

## 2014-01-31 MED ORDER — INSULIN LISPRO PROT & LISPRO (50-50 MIX) 100 UNIT/ML ~~LOC~~ SUSP: 41.0000 [IU] | Freq: Two times a day (BID) | SUBCUTANEOUS | Status: DC

## 2014-02-16 ENCOUNTER — Other Ambulatory Visit: Payer: Self-pay

## 2014-02-16 MED ORDER — ROSUVASTATIN CALCIUM 40 MG PO TABS: 40.0000 mg | ORAL_TABLET | Freq: Every day | ORAL | Status: DC

## 2014-03-14 ENCOUNTER — Telehealth: Payer: Self-pay

## 2014-03-14 NOTE — Telephone Encounter (Signed)
Please advise 

## 2014-03-14 NOTE — Telephone Encounter (Signed)
Grier Czerwinski [phone]  Anderson [place] called and said she was having trouble getting transportation for her doctors visits. She stated that [facility] said they needed to talk with her PCP. She also stated that if we called [facility] [phone] or [phone] they could explain better, she was also wondering if Dr Larose Kells could talk with her case worker.

## 2014-03-15 NOTE — Telephone Encounter (Signed)
Please call pt and case manager, let me know what i can do for her

## 2014-03-15 NOTE — Telephone Encounter (Signed)
Spoke with Pt, she informed me that her case manager was able to speak with [facility], that something had gotten mixed up with her Medicaid and they will begin picking her up and bringing her to appointments again.

## 2014-03-15 NOTE — Telephone Encounter (Signed)
thank you !!

## 2014-03-20 ENCOUNTER — Other Ambulatory Visit: Payer: Self-pay | Admitting: Internal Medicine

## 2014-04-18 ENCOUNTER — Ambulatory Visit: Payer: Medicare Other | Admitting: Endocrinology

## 2014-04-24 ENCOUNTER — Ambulatory Visit (INDEPENDENT_AMBULATORY_CARE_PROVIDER_SITE_OTHER): Payer: Medicare Other | Admitting: Endocrinology

## 2014-04-24 ENCOUNTER — Encounter: Payer: Self-pay | Admitting: Endocrinology

## 2014-04-24 VITALS — BP 91/56 | HR 84 | Temp 98.3°F | Ht 69.0 in | Wt 313.0 lb

## 2014-04-24 DIAGNOSIS — E1042 Type 1 diabetes mellitus with diabetic polyneuropathy: Secondary | ICD-10-CM

## 2014-04-24 LAB — HEMOGLOBIN A1C: Hgb A1c MFr Bld: 8 % — ABNORMAL HIGH (ref 4.6–6.5)

## 2014-04-24 NOTE — Patient Instructions (Addendum)
Please make a follow-up appointment in 3 months.   On this type of insulin schedule, you should eat meals on a regular schedule.  If a meal is missed or significantly delayed, your blood sugar could go low. check your blood sugar 2 times a day.  vary the time of day when you check, between before the 3 meals, and at bedtime.  also check if you have symptoms of your blood sugar being too high or too low.  please keep a record of the readings and bring it to your next appointment here.  please call us sooner if your blood sugar goes below 70, or if it stays over 200.   A diabetes blood test is requested for you today.  We'll contact you with results.   Please continue the same insulin.  However, on Sunday mornings, take just 20 units.   Please stop taking the clonidine, and see Dr Larose Kells in 2-3 days.

## 2014-04-24 NOTE — Progress Notes (Signed)
Subjective:    Patient ID: [name], female    DOB: [birth year], 63 y.o.   MRN: [id number]  HPI  Pt returns for f/u of diabetes mellitus: DM type: Insulin-requiring type 2 Dx'ed: 9163 Complications: polyneuropathy Therapy: insulin since GDM: never DKA: never Severe hypoglycemia: never Pancreatitis: never Other: she declines weight-loss surgery; she did not achieve good glycemic control with multiple daily injections, but she has done better with the simpler BID insulin regimen. Interval history: she brings a record of her cbg's which i have reviewed today.  Almost all are in the mid-100's.  There is no trend throughout the day.  She had 1 mild episode of hypoglycemia yesterday (_0 08,2005  . Internal hemorrhoids   . OBSTRUCTIVE SLEEP APNEA 06/23/2008    Severe OSA per sleep study 2010, Rx a CPAP  . DIABETES MELLITUS 10/06/2006  . UNSPECIFIED ANEMIA 12/10/2009  . GERD (gastroesophageal reflux disease) 07/25/2013    Past Surgical History  Procedure Laterality Date  . Right knee replacement  2005  . 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 Colonoscopy  08/01/2011    Procedure: COLONOSCOPY;  Surgeon: Inda Castle, MD;  Location: [facility];  Service: Endoscopy;  Laterality: N/A;    History   Social History  . Marital Status: Widowed    Spouse Name: N/A    Number of Children: 4  . Years of Education: N/A   Occupational History  . disability    Social History Main Topics  . Smoking status: Former Smoker -- 0.20 packs/day for 4 years    Types: Cigarettes    Quit date: 05/13/1995  . Smokeless tobacco: Never Used  . Alcohol  Use: Yes     Comment: Rarely  . Drug Use: No  . Sexual Activity: Not on file   Other Topics Concern  . Not on file   Social History Narrative   Widow , lives by herself    Current Outpatient Prescriptions on File Prior to Visit  Medication Sig Dispense Refill  . ACCU-CHEK FASTCLIX LANCETS MISC TEST TWICE A DAY 102 each 12  . albuterol (PROVENTIL HFA;VENTOLIN HFA) 108 (90 BASE) MCG/ACT inhaler Inhale 2 puffs into the lungs every 4 (four) hours as needed for wheezing. 1 Inhaler 0  . aspirin 325 MG tablet Take 325 mg by mouth daily.      . Blood Glucose Monitoring Suppl (ACCU-CHEK AVIVA PLUS) W/DEVICE KIT 1 Device by Does not apply route once. 1 kit 0  . carvedilol (COREG) 25 MG tablet take 1 tablet by mouth twice a day 180 tablet 1  . glucose blood (ACCU-CHEK AVIVA PLUS) test strip TEST twice a day 100 each 12  . insulin lispro protamine-lispro (HUMALOG 50/50 MIX) (50-50) 100 UNIT/ML SUSP injection Inject 0.41 mLs (41 Units total) into the skin 2 (two) times daily before a meal. (Patient taking differently: Inject 42 Units into the skin 2 (two) times daily before a meal. ) 30 mL 2  . Insulin Pen Needle 29G X 12MM MISC Use as directed three times daily 100 each 3  . omeprazole (PRILOSEC) 20 MG capsule Take  1 capsule (20 mg total) by mouth daily. 30 capsule 3  . potassium chloride (K-DUR,KLOR-CON) 10 MEQ tablet take 1 tablet by mouth once daily 90 tablet 1  . rosuvastatin (CRESTOR) 40 MG tablet Take 1 tablet (40 mg total) by mouth daily. 30 tablet 2  . SYMBICORT 160-4.5 MCG/ACT inhaler inhale 2 puffs by mouth twice a day 10.2 g 3  . TAZTIA XT 360 MG 24 hr capsule take 1 capsule by mouth once daily 90 capsule 1  . telmisartan (MICARDIS) 40 MG tablet take 1 tablet by mouth once daily 90 tablet 1  . citalopram (CELEXA) 20 MG tablet Take 1 tablet (20 mg total) by mouth daily. (Patient not taking: Reported on 04/24/2014) 30 tablet 3   No current facility-administered medications on file prior to  visit.    No Known Allergies  Family History  Problem Relation Age of Onset  . Asthma Mother   . Stroke Mother   . Diabetes Other     M, B, S  . Hypertension Sister     M, S,B  . Pancreatic cancer Brother   . Colon cancer Neg Hx   . Prostate cancer Neg Hx   . Breast cancer Neg Hx     BP 91/56 mmHg  Pulse 84  Temp(Src) 98.3 F (36.8 C) (Oral)  Ht _0  (1.753 m)  Wt 313 lb (141.976 kg)  BMI 46.20 kg/m2  SpO2 95%   Review of Systems She denies LOC and weight change.     Objective:   Physical Exam VITAL SIGNS:  See vs page GENERAL: no distress Pulses: dorsalis pedis intact bilat.   Feet: no deformity.  no edema Skin:  no ulcer on the feet.  normal color and temp. Neuro: sensation is intact to touch on the feet.    Lab Results  Component Value Date   HGBA1C 8.0* 04/24/2014      Assessment & Plan:  DM: moderate exacerbation Side-effect of rx: hypoglycemia: she needs less insulin on sundays.  HTN: overcontrolled, asymptomatic.     Patient is advised the following: Patient Instructions  Please make a follow-up appointment in 3 months.   On this type of insulin schedule, you should eat meals on a regular schedule.  If a meal is missed or significantly delayed, your blood sugar could go low. check your blood sugar 2 times a day.  vary the time of day when you check, between before the 3 meals, and at bedtime.  also check if you have symptoms of your blood sugar being too high or too low.  please keep a record of the readings and bring it to your next appointment here.  please call us sooner if your blood sugar goes below 70, or if it stays over 200.   A diabetes blood test is requested for you today.  We'll contact you with results.   Please continue the same insulin.  However, on Sunday mornings, take just 20 units.   Please stop taking the clonidine, and see Dr Larose Kells in 2-3 days.

## 2014-04-27 ENCOUNTER — Other Ambulatory Visit: Payer: Self-pay

## 2014-05-03 ENCOUNTER — Telehealth: Payer: Self-pay | Admitting: *Deleted

## 2014-05-03 ENCOUNTER — Ambulatory Visit: Payer: Medicare Other | Admitting: Internal Medicine

## 2014-05-03 NOTE — Telephone Encounter (Signed)
Called number in pt's chart and left msg for her to call our office to reschedule appt

## 2014-05-03 NOTE — Telephone Encounter (Signed)
Yes if she can arrange transportation

## 2014-05-03 NOTE — Telephone Encounter (Signed)
Please reschedule

## 2014-05-03 NOTE — Telephone Encounter (Signed)
Pt did not show for appointment 05/03/2014 at 10:45am for "low bp/per dr. ellison/ss--offered earlier appt, pt declined stating lack of transportation  12/22 lm to confirm/gd"

## 2014-05-03 NOTE — Telephone Encounter (Signed)
Spoke with pt. She said she can not call transportation today, but will call them after Christmas and find out when they can bring her, then call us back to reschedule.

## 2014-05-03 NOTE — Telephone Encounter (Signed)
Per pt, she states "someone told her appointment was January 23". She has to give 14 day notice to transportation.  Pt has appointment with Larose Kells 06/05/2014 for 6 month follow up. Do you need to see her before January 25th for her low BP?

## 2014-05-10 ENCOUNTER — Other Ambulatory Visit: Payer: Self-pay | Admitting: Internal Medicine

## 2014-05-12 DIAGNOSIS — G4733 Obstructive sleep apnea (adult) (pediatric): Secondary | ICD-10-CM | POA: Diagnosis not present

## 2014-05-12 DIAGNOSIS — R0902 Hypoxemia: Secondary | ICD-10-CM | POA: Diagnosis not present

## 2014-05-25 ENCOUNTER — Other Ambulatory Visit: Payer: Self-pay

## 2014-05-25 MED ORDER — ROSUVASTATIN CALCIUM 40 MG PO TABS: 40.0000 mg | ORAL_TABLET | Freq: Every day | ORAL | Status: DC

## 2014-05-29 ENCOUNTER — Ambulatory Visit: Payer: Medicare Other | Admitting: Internal Medicine

## 2014-05-29 ENCOUNTER — Telehealth: Payer: Self-pay | Admitting: Internal Medicine

## 2014-05-29 NOTE — Telephone Encounter (Signed)
Pt states transportation was closed due to the holiday and was not informed requesting $50 no show waived.

## 2014-05-31 ENCOUNTER — Other Ambulatory Visit: Payer: Self-pay

## 2014-05-31 MED ORDER — INSULIN LISPRO PROT & LISPRO (50-50 MIX) 100 UNIT/ML ~~LOC~~ SUSP: 42.0000 [IU] | Freq: Two times a day (BID) | SUBCUTANEOUS | Status: DC

## 2014-06-01 ENCOUNTER — Telehealth: Payer: Self-pay | Admitting: Internal Medicine

## 2014-06-01 ENCOUNTER — Encounter: Payer: Self-pay | Admitting: Internal Medicine

## 2014-06-01 ENCOUNTER — Ambulatory Visit (INDEPENDENT_AMBULATORY_CARE_PROVIDER_SITE_OTHER): Payer: Medicare Other | Admitting: Internal Medicine

## 2014-06-01 ENCOUNTER — Ambulatory Visit (HOSPITAL_BASED_OUTPATIENT_CLINIC_OR_DEPARTMENT_OTHER)
Admission: RE | Admit: 2014-06-01 | Discharge: 2014-06-01 | Disposition: A | Discharge status: Home / Self Care | Payer: Medicare Other | Source: Ambulatory Visit | Attending: Internal Medicine | Admitting: Internal Medicine

## 2014-06-01 VITALS — BP 122/76 | HR 85 | Temp 97.8°F | Ht 69.0 in | Wt 310.5 lb

## 2014-06-01 DIAGNOSIS — Z0000 Encounter for general adult medical examination without abnormal findings: Secondary | ICD-10-CM

## 2014-06-01 DIAGNOSIS — F329 Major depressive disorder, single episode, unspecified: Secondary | ICD-10-CM

## 2014-06-01 DIAGNOSIS — M25531 Pain in right wrist: Secondary | ICD-10-CM

## 2014-06-01 DIAGNOSIS — M19031 Primary osteoarthritis, right wrist: Secondary | ICD-10-CM | POA: Diagnosis not present

## 2014-06-01 DIAGNOSIS — Z23 Encounter for immunization: Secondary | ICD-10-CM | POA: Diagnosis not present

## 2014-06-01 DIAGNOSIS — F32A Depression, unspecified: Secondary | ICD-10-CM

## 2014-06-01 DIAGNOSIS — J9801 Acute bronchospasm: Secondary | ICD-10-CM

## 2014-06-01 DIAGNOSIS — I10 Essential (primary) hypertension: Secondary | ICD-10-CM | POA: Diagnosis not present

## 2014-06-01 LAB — BASIC METABOLIC PANEL
BUN: 22 mg/dL (ref 6–23)
CHLORIDE: 105 meq/L (ref 96–112)
CO2: 24 meq/L (ref 19–32)
Calcium: 9.6 mg/dL (ref 8.4–10.5)
Creatinine, Ser: 1.21 mg/dL — ABNORMAL HIGH (ref 0.40–1.20)
GFR: 57.4 mL/min — AB (ref >60.00)
Glucose, Bld: 115 mg/dL — ABNORMAL HIGH (ref 70–99)
POTASSIUM: 3.9 meq/L (ref 3.5–5.1)
SODIUM: 138 meq/L (ref 135–145)

## 2014-06-01 MED ORDER — ALBUTEROL SULFATE HFA 108 (90 BASE) MCG/ACT IN AERS: 2.0000 | INHALATION_SPRAY | RESPIRATORY_TRACT | Status: DC | PRN

## 2014-06-01 NOTE — Assessment & Plan Note (Signed)
Continue with Symbicort, Concept of rescue inhaler explained to the patient, refill albuterol

## 2014-06-01 NOTE — Telephone Encounter (Signed)
Seen today. 

## 2014-06-01 NOTE — Assessment & Plan Note (Addendum)
Self discontinue citalopram due to headache, headache gone (see HPI), currently on no medications and doing well emotionally.

## 2014-06-01 NOTE — Progress Notes (Signed)
Pre visit review using our clinic review tool, if applicable. No additional management support is needed unless otherwise documented below in the visit note. 

## 2014-06-01 NOTE — Assessment & Plan Note (Signed)
Continue Micardis, taztia, potassium supplements, check a BMP Will start checking her BPs at home, see instructions

## 2014-06-01 NOTE — Patient Instructions (Addendum)
Get your blood work before you leave   Stop by the first floor and get the XR   Use a  wrist splint  for 2 weeks, if the pain is not improving please let me know  Takes Symbicort twice a day every day. Use albuterol only if you are coughing or wheezing.  Check the  blood pressure 2 or 3 times a  Week   Be sure your blood pressure is between 110/65 and  145/85.  if it is consistently higher or lower, let me know      Please come back to the office by 6 -2016  for a physical exam. Come back fasting

## 2014-06-01 NOTE — Telephone Encounter (Signed)
FYI. Pt has appt scheduled today(06/01/2014) at 1045.

## 2014-06-01 NOTE — Progress Notes (Signed)
Subjective:    Patient ID: [name], female    DOB: Dec 05, 1950, 64 y.o.   MRN: [id number]  DOS:  06/01/2014 Type of visit - description : rov Interval history: Depression, was taking citalopram, developed a headache she felt related to SSRIs, self d/c citalopram, HAs better Currently doing well emotionally without any medications Sleep apnea, does not use the CPAP daily Hypertension, good medication compliance, BP today is great, no recent ambulatory BPs but she got a BP cuff yesterday from her insurance company, they send a nurse to visit her, she did some education which is great. History of bronchospasm,  on Symbicort twice a day, has not reach for albuterol although she admits she wheezes sometimes Wrist pain for 3 or 4 weeks, denies any injury, fall.  ROS Occasional chest pain and difficulty breathing, not a new complaint, not worse than baseline No lower extremity edema No nausea, vomiting, diarrhea or blood in the stools  Past Medical History  Diagnosis Date  . HYPERLIPIDEMIA 01/11/2009  . DEPRESSION 09/26/2008  . HYPERTENSION 10/06/2006  . DEGENERATIVE JOINT DISEASE 10/06/2006  . Pain in joint, multiple sites 11/10/2006  . INSOMNIA 09/26/2008  . CHEST PAIN 11/18/2007  . Diverticulosis     6283,[id number]  . Internal hemorrhoids   . OBSTRUCTIVE SLEEP APNEA 06/23/2008    Severe OSA per sleep study 2010, Rx a CPAP  . DIABETES MELLITUS 10/06/2006  . UNSPECIFIED ANEMIA 12/10/2009  . GERD (gastroesophageal reflux disease) 07/25/2013  . Bronchospasm 05/28/2012    Past Surgical History  Procedure Laterality Date  . Right knee replacement  2005  . 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 Colonoscopy  08/01/2011    Procedure: COLONOSCOPY;  Surgeon: Inda Castle, MD;  Location: [facility];  Service: Endoscopy;  Laterality: N/A;    History   Social History  . Marital Status: Widowed    Spouse Name: N/A    Number of Children: 4  . Years of Education: N/A   Occupational History  .  disability    Social History Main Topics  . Smoking status: Former Smoker -- 0.20 packs/day for 4 years    Types: Cigarettes    Quit date: 05/13/1995  . Smokeless tobacco: Never Used  . Alcohol Use: Yes     Comment: Rarely  . Drug Use: No  . Sexual Activity: Not on file   Other Topics Concern  . Not on file   Social History Narrative   Widow , lives by herself        Medication List       This list is accurate as of: 06/01/14 11:59 PM.  Always use your most recent med list.               ACCU-CHEK AVIVA PLUS W/DEVICE Kit  1 Device by Does not apply route once.     ACCU-CHEK FASTCLIX LANCETS Misc  TEST TWICE A DAY     albuterol 108 (90 BASE) MCG/ACT inhaler  Commonly known as:  PROVENTIL HFA;VENTOLIN HFA  Inhale 2 puffs into the lungs every 4 (four) hours as needed for wheezing.     aspirin 325 MG tablet  Take 325 mg by mouth daily.     carvedilol 25 MG tablet  Commonly known as:  COREG  take 1 tablet by mouth twice a day     glucose blood test strip  Commonly known as:  ACCU-CHEK AVIVA PLUS  TEST twice a day     insulin lispro  protamine-lispro (50-50) 100 UNIT/ML Susp injection  Commonly known as:  HUMALOG 50/50 MIX  Inject 0.42 mLs (42 Units total) into the skin 2 (two) times daily before a meal.     Insulin Pen Needle 29G X 12MM Misc  Use as directed three times daily     omeprazole 20 MG capsule  Commonly known as:  PRILOSEC  Take 1 capsule (20 mg total) by mouth daily.     potassium chloride 10 MEQ tablet  Commonly known as:  K-DUR,KLOR-CON  take 1 tablet by mouth once daily     rosuvastatin 40 MG tablet  Commonly known as:  CRESTOR  Take 1 tablet (40 mg total) by mouth daily.     SYMBICORT 160-4.5 MCG/ACT inhaler  Generic drug:  budesonide-formoterol  inhale 2 puffs by mouth twice a day     TAZTIA XT 360 MG 24 hr capsule  Generic drug:  diltiazem  take 1 capsule by mouth once daily     telmisartan 40 MG tablet  Commonly known as:   MICARDIS  take 1 tablet by mouth once daily           Objective:   Physical Exam  Musculoskeletal:       Arms:  BP 122/76 mmHg  Pulse 85  Temp(Src) 97.8 F (36.6 C) (Oral)  Ht $R'5\' 9"'gd$  (1.753 m)  Wt 310 lb 8 oz (140.842 kg)  BMI 45.83 kg/m2  SpO2 95%  General -- alert, well-developed, NAD.  Neck --no thyromegaly  Lungs -- normal respiratory effort, no intercostal retractions, no accessory muscle use, and normal breath sounds.  Heart-- normal rate, regular rhythm, no murmur.  Extremities-- no pretibial edema bilaterally  Neurologic--  alert & oriented X3. Speech normal, gait appropriate for age, strength symmetric and appropriate for age.  Psych-- Cognition and judgment appear intact. Cooperative with normal attention span and concentration. No anxious or depressed appearing.     Assessment & Plan:   Wrist  pain, no history of injury, will get a  x-ray and recommend wrist splint. She also had a subcutaneous mass, likely a lipoma, I think is an incidental finding.

## 2014-06-01 NOTE — Telephone Encounter (Signed)
Caller name: Abigail Butts from [facility] Relation to pt: Call back number: [phone] Pharmacy:  Reason for call:   A1C yesterday was 11.3 and bp was 124/84.

## 2014-06-01 NOTE — Assessment & Plan Note (Signed)
Encourage to use the CPAP consistently

## 2014-06-05 ENCOUNTER — Ambulatory Visit: Payer: Medicare Other | Admitting: Internal Medicine

## 2014-06-12 DIAGNOSIS — R0902 Hypoxemia: Secondary | ICD-10-CM | POA: Diagnosis not present

## 2014-06-12 DIAGNOSIS — G4733 Obstructive sleep apnea (adult) (pediatric): Secondary | ICD-10-CM | POA: Diagnosis not present

## 2014-06-13 ENCOUNTER — Telehealth: Payer: Self-pay | Admitting: Internal Medicine

## 2014-06-13 NOTE — Telephone Encounter (Signed)
Caller name:Bangerter, Anderson [place] Relation to PP:HKFE Call back number:[phone] Pharmacy:  Reason for call: pt would like for you to give her a call, states she got her lab results and she does not understand them and has questions.

## 2014-06-13 NOTE — Telephone Encounter (Signed)
Spoke with Pt, informed her that kidney function was slightly decreased to avoid ibuprofen and naproxen, and that Dr. Larose Kells will recheck kidney function in April at her CPE. Pt verbalized understanding

## 2014-06-22 ENCOUNTER — Telehealth: Payer: Self-pay | Admitting: *Deleted

## 2014-06-22 NOTE — Telephone Encounter (Signed)
Patient dropped off paperwork for the [employer] and Financial controller for attestation of medical need. Forms filled out as much as possible and forwarded to Dr. Larose Kells. JG//CMA

## 2014-06-28 NOTE — Telephone Encounter (Signed)
I called and spoke with patient and she stated that when her blood sugar drops, it's difficult for her to stand long and walk long distances. She states she needs someone to come into her home to help with household chores and cooking. She states she is able to bath, dress and feed herself.

## 2014-06-28 NOTE — Telephone Encounter (Signed)
Unable to sign document base on this information.

## 2014-07-03 NOTE — Telephone Encounter (Signed)
Appointment scheduled for this.

## 2014-07-04 ENCOUNTER — Other Ambulatory Visit: Payer: Self-pay

## 2014-07-04 DIAGNOSIS — L602 Onychogryphosis: Secondary | ICD-10-CM | POA: Diagnosis not present

## 2014-07-04 DIAGNOSIS — E1151 Type 2 diabetes mellitus with diabetic peripheral angiopathy without gangrene: Secondary | ICD-10-CM | POA: Diagnosis not present

## 2014-07-04 DIAGNOSIS — L84 Corns and callosities: Secondary | ICD-10-CM | POA: Diagnosis not present

## 2014-07-05 ENCOUNTER — Encounter: Payer: Self-pay | Admitting: Internal Medicine

## 2014-07-05 ENCOUNTER — Ambulatory Visit (INDEPENDENT_AMBULATORY_CARE_PROVIDER_SITE_OTHER): Payer: Medicare Other | Admitting: Internal Medicine

## 2014-07-05 VITALS — BP 151/79 | HR 84 | Temp 98.1°F | Resp 16 | Ht 69.0 in | Wt 313.0 lb

## 2014-07-05 DIAGNOSIS — E119 Type 2 diabetes mellitus without complications: Secondary | ICD-10-CM | POA: Diagnosis not present

## 2014-07-05 DIAGNOSIS — M19031 Primary osteoarthritis, right wrist: Secondary | ICD-10-CM

## 2014-07-05 NOTE — Patient Instructions (Signed)
We will refer you to the orthopedic doctor regards the wrist pain. Will refer you to a occupational therapist to see if he qualify for help.

## 2014-07-05 NOTE — Progress Notes (Signed)
Pre visit review using our clinic review tool, if applicable. No additional management support is needed unless otherwise documented below in the visit note. 

## 2014-07-05 NOTE — Progress Notes (Signed)
Subjective:    Patient ID: [name], female    DOB: 08-Sep-1950, 64 y.o.   MRN: [id number]  DOS:  07/05/2014 Type of visit - description : acute Interval history: Patient likes me to refer her to home health agency [facility]") because she needs help with cooking and cleaning. States that she needs that because she had 2 falls last year and 1 fall this year. Still has right wrist pain and that is also affecting her. States that sometimes she has "low blood sugar". When asked, reports that her sugars are in the 170-180 range.      Review of Systems   Past Medical History  Diagnosis Date  . HYPERLIPIDEMIA 01/11/2009  . DEPRESSION 09/26/2008  . HYPERTENSION 10/06/2006  . DEGENERATIVE JOINT DISEASE 10/06/2006  . Pain in joint, multiple sites 11/10/2006  . INSOMNIA 09/26/2008  . CHEST PAIN 11/18/2007  . Diverticulosis     8563,1497  . Internal hemorrhoids   . OBSTRUCTIVE SLEEP APNEA 06/23/2008    Severe OSA per sleep study 2010, Rx a CPAP  . DIABETES MELLITUS 10/06/2006  . UNSPECIFIED ANEMIA 12/10/2009  . GERD (gastroesophageal reflux disease) 07/25/2013  . Bronchospasm 05/28/2012    Past Surgical History  Procedure Laterality Date  . Right knee replacement  2005  . 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 Colonoscopy  08/01/2011    Procedure: COLONOSCOPY;  Surgeon: Inda Castle, MD;  Location: [facility];  Service: Endoscopy;  Laterality: N/A;    History   Social History  . Marital Status: Widowed    Spouse Name: N/A  . Number of Children: 4  . Years of Education: N/A   Occupational History  . disability    Social History Main Topics  . Smoking status: Former Smoker -- 0.20 packs/day for 4 years    Types: Cigarettes    Quit date: 05/13/1995  . Smokeless tobacco: Never Used  . Alcohol Use: Yes     Comment: Rarely  . Drug Use: No  . Sexual Activity: Not on file   Other Topics Concern  . Not on file   Social History Narrative   Widow , lives by herself         Medication List       This list is accurate as of: 07/05/14 11:59 PM.  Always use your most recent med list.               ACCU-CHEK AVIVA PLUS W/DEVICE Kit  1 Device by Does not apply route once.     ACCU-CHEK FASTCLIX LANCETS Misc  TEST TWICE A DAY     albuterol 108 (90 BASE) MCG/ACT inhaler  Commonly known as:  PROVENTIL HFA;VENTOLIN HFA  Inhale 2 puffs into the lungs every 4 (four) hours as needed for wheezing.     aspirin 325 MG tablet  Take 325 mg by mouth daily.     carvedilol 25 MG tablet  Commonly known as:  COREG  take 1 tablet by mouth twice a day     glucose blood test strip  Commonly known as:  ACCU-CHEK AVIVA PLUS  TEST twice a day     insulin lispro protamine-lispro (50-50) 100 UNIT/ML Susp injection  Commonly known as:  HUMALOG 50/50 MIX  Inject 0.42 mLs (42 Units total) into the skin 2 (two) times daily before a meal.     Insulin Pen Needle 29G X 12MM Misc  Use as directed three times daily     omeprazole 20  MG capsule  Commonly known as:  PRILOSEC  Take 1 capsule (20 mg total) by mouth daily.     potassium chloride 10 MEQ tablet  Commonly known as:  K-DUR,KLOR-CON  take 1 tablet by mouth once daily     rosuvastatin 40 MG tablet  Commonly known as:  CRESTOR  Take 1 tablet (40 mg total) by mouth daily.     SYMBICORT 160-4.5 MCG/ACT inhaler  Generic drug:  budesonide-formoterol  inhale 2 puffs by mouth twice a day     TAZTIA XT 360 MG 24 hr capsule  Generic drug:  diltiazem  take 1 capsule by mouth once daily     telmisartan 40 MG tablet  Commonly known as:  MICARDIS  take 1 tablet by mouth once daily           Objective:   Physical Exam BP 151/79 mmHg  Pulse 84  Temp(Src) 98.1 F (36.7 C) (Oral)  Resp 16  Ht 5' 9" (1.753 m)  Wt 313 lb (141.976 kg)  BMI 46.20 kg/m2  SpO2 94% General:   Well developed, well nourished . NAD.   Muscle skeletal: Right wrist without puffiness or deformity, no TTP. Proximal from the wrist,  i again notice a SQ , soft mass slightly TTP?  Skin: Not pale. Not jaundice Neurologic:  alert & oriented X3.  Speech normal, gait appropriate for age and unassisted Psych--  Cognition and judgment appear intact.  Cooperative with normal attention span and concentration.  No anxious or depressed appearing.       Assessment & Plan:

## 2014-07-06 NOTE — Assessment & Plan Note (Signed)
Wrist pain, Ongoing problem, also has some subcutaneous mass likely a lipoma. Plan: Refer to orthopedic surgery

## 2014-07-06 NOTE — Assessment & Plan Note (Signed)
Patient states that for a number of reason including low blood sugar she needs help with cooking and cleaning, I'm not sure if she meets criteria from the medical standpoint. Plan: Referred to occupational therapy for further evaluation. If they agree that she needs help,  I will refer her to the appropriate agency

## 2014-07-10 DIAGNOSIS — M25531 Pain in right wrist: Secondary | ICD-10-CM | POA: Diagnosis not present

## 2014-07-11 DIAGNOSIS — G4733 Obstructive sleep apnea (adult) (pediatric): Secondary | ICD-10-CM | POA: Diagnosis not present

## 2014-07-11 DIAGNOSIS — R0902 Hypoxemia: Secondary | ICD-10-CM | POA: Diagnosis not present

## 2014-07-24 ENCOUNTER — Encounter: Payer: Self-pay | Admitting: Endocrinology

## 2014-07-24 ENCOUNTER — Ambulatory Visit (INDEPENDENT_AMBULATORY_CARE_PROVIDER_SITE_OTHER): Payer: Medicare Other | Admitting: Endocrinology

## 2014-07-24 VITALS — BP 138/78 | HR 83 | Temp 98.1°F | Resp 14 | Wt 314.0 lb

## 2014-07-24 DIAGNOSIS — E1042 Type 1 diabetes mellitus with diabetic polyneuropathy: Secondary | ICD-10-CM | POA: Diagnosis not present

## 2014-07-24 LAB — HEMOGLOBIN A1C: Hgb A1c MFr Bld: 7.8 % — ABNORMAL HIGH (ref 4.6–6.5)

## 2014-07-24 NOTE — Progress Notes (Signed)
Subjective:    Patient ID: [name], female    DOB: 1951/05/09, 64 y.o.   MRN: [id number]  HPI Pt returns for f/u of diabetes mellitus: DM type: Insulin-requiring type 2 Dx'ed: 0375 Complications: polyneuropathy Therapy: insulin since 2004 GDM: never DKA: never Severe hypoglycemia: never Pancreatitis: never Other: she declines weight-loss surgery; she did not achieve good glycemic control with multiple daily injections, but she has done better with the simpler BID insulin regimen.  She has been advised to take less insulin on $RemoveBe'sundays, due to hypoglycemia then.   Interval history: no cbg record, but states cbg was mildly low yesterday (Sunday) afternoon, when she took the full dosage of insulin.   Past Medical History  Diagnosis Date  . HYPERLIPIDEMIA 01/11/2009  . DEPRESSION 09/26/2008  . HYPERTENSION 10/06/2006  . DEGENERATIVE JOINT DISEASE 10/06/2006  . Pain in joint, multiple sites 11/10/2006  . INSOMNIA 09/26/2008  . CHEST PAIN 11/18/2007  . Diverticulosis     20'xWrQRrETS$ 08,2005  . Internal hemorrhoids   . OBSTRUCTIVE SLEEP APNEA 06/23/2008    Severe OSA per sleep study 2010, Rx a CPAP  . DIABETES MELLITUS 10/06/2006  . UNSPECIFIED ANEMIA 12/10/2009  . GERD (gastroesophageal reflux disease) 07/25/2013  . Bronchospasm 05/28/2012    Past Surgical History  Procedure Laterality Date  . Right knee replacement  2005  . 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 Colonoscopy  08/01/2011    Procedure: COLONOSCOPY;  Surgeon: Inda Castle, MD;  Location: [facility];  Service: Endoscopy;  Laterality: N/A;    History   Social History  . Marital Status: Widowed    Spouse Name: N/A  . Number of Children: 4  . Years of Education: N/A   Occupational History  . disability    Social History Main Topics  . Smoking status: Former Smoker -- 0.20 packs/day for 4 years    Types: Cigarettes    Quit date: 05/13/1995  . Smokeless tobacco: Never Used  . Alcohol Use: Yes     Comment: Rarely  . Drug  Use: No  . Sexual Activity: Not on file   Other Topics Concern  . Not on file   Social History Narrative   Widow , lives by herself    Current Outpatient Prescriptions on File Prior to Visit  Medication Sig Dispense Refill  . ACCU-CHEK FASTCLIX LANCETS MISC TEST TWICE A DAY 102 each 12  . albuterol (PROVENTIL HFA;VENTOLIN HFA) 108 (90 BASE) MCG/ACT inhaler Inhale 2 puffs into the lungs every 4 (four) hours as needed for wheezing. 1 Inhaler 6  . aspirin 325 MG tablet Take 325 mg by mouth daily.      . Blood Glucose Monitoring Suppl (ACCU-CHEK AVIVA PLUS) W/DEVICE KIT 1 Device by Does not apply route once. 1 kit 0  . carvedilol (COREG) 25 MG tablet take 1 tablet by mouth twice a day 180 tablet 0  . glucose blood (ACCU-CHEK AVIVA PLUS) test strip TEST twice a day 100 each 12  . insulin lispro protamine-lispro (HUMALOG 50/50 MIX) (50-50) 100 UNIT/ML SUSP injection Inject 0.42 mLs (42 Units total) into the skin 2 (two) times daily before a meal. 30 mL 2  . Insulin Pen Needle 29G X 12MM MISC Use as directed three times daily 100 each 3  . omeprazole (PRILOSEC) 20 MG capsule Take 1 capsule (20 mg total) by mouth daily. 30 capsule 3  . potassium chloride (K-DUR,KLOR-CON) 10 MEQ tablet take 1 tablet by mouth once daily 90 tablet 0  .  rosuvastatin (CRESTOR) 40 MG tablet Take 1 tablet (40 mg total) by mouth daily. 30 tablet 2  . SYMBICORT 160-4.5 MCG/ACT inhaler inhale 2 puffs by mouth twice a day 10.2 g 3  . TAZTIA XT 360 MG 24 hr capsule take 1 capsule by mouth once daily 90 capsule 0  . telmisartan (MICARDIS) 40 MG tablet take 1 tablet by mouth once daily 90 tablet 1   No current facility-administered medications on file prior to visit.    No Known Allergies  Family History  Problem Relation Age of Onset  . Asthma Mother   . Stroke Mother   . Diabetes Other     M, B, S  . Hypertension Sister     M, S,B  . Pancreatic cancer Brother   . Colon cancer Neg Hx   . Prostate cancer Neg Hx     . Breast cancer Neg Hx     BP 138/78 mmHg  Pulse 83  Temp(Src) 98.1 F (36.7 C) (Oral)  Resp 14  Wt 314 lb (142.429 kg)  SpO2 99%  Review of Systems Denies LOC and weight change    Objective:   Physical Exam VITAL SIGNS:  See vs page GENERAL: no distress Pulses: dorsalis pedis intact bilat.   MSK: no deformity of the feet CV: trace bilat leg edema, and bilat leg varicosities. Skin:  no ulcer on the feet.  normal color and temp on the feet. Neuro: sensation is intact to touch on the feet  Lab Results  Component Value Date   HGBA1C 7.8* 07/24/2014       Assessment & Plan:  DM, stable: this is the best control this pt should aim for, given this regimen, which does match insulin to her changing needs throughout the day Noncompliance with cbg recording and insulin dosing, worse: I'll work around this as best I can.  i advised pt of risks.   Patient is advised the following: Patient Instructions  Please make a follow-up appointment in 3 months.   On this type of insulin schedule, you should eat meals on a regular schedule.  If a meal is missed or significantly delayed, your blood sugar could go low. check your blood sugar 2 times a day.  vary the time of day when you check, between before the 3 meals, and at bedtime.  also check if you have symptoms of your blood sugar being too high or too low.  please keep a record of the readings and bring it to your next appointment here.  please call us sooner if your blood sugar goes below 70, or if it stays over 200.   A diabetes blood test is requested for you today.  We'll contact you with results.   Please continue the same insulin.  However, on Sunday mornings, take just 20 units.

## 2014-07-24 NOTE — Patient Instructions (Addendum)
Please make a follow-up appointment in 3 months.   On this type of insulin schedule, you should eat meals on a regular schedule.  If a meal is missed or significantly delayed, your blood sugar could go low. check your blood sugar 2 times a day.  vary the time of day when you check, between before the 3 meals, and at bedtime.  also check if you have symptoms of your blood sugar being too high or too low.  please keep a record of the readings and bring it to your next appointment here.  please call us sooner if your blood sugar goes below 70, or if it stays over 200.   A diabetes blood test is requested for you today.  We'll contact you with results.   Please continue the same insulin.  However, on Sunday mornings, take just 20 units.

## 2014-08-07 ENCOUNTER — Other Ambulatory Visit: Payer: Self-pay | Admitting: Internal Medicine

## 2014-08-11 DIAGNOSIS — R0902 Hypoxemia: Secondary | ICD-10-CM | POA: Diagnosis not present

## 2014-08-11 DIAGNOSIS — G4733 Obstructive sleep apnea (adult) (pediatric): Secondary | ICD-10-CM | POA: Diagnosis not present

## 2014-08-15 ENCOUNTER — Telehealth: Payer: Self-pay | Admitting: Internal Medicine

## 2014-08-15 NOTE — Telephone Encounter (Signed)
Pre Visit letter sent  °

## 2014-08-16 ENCOUNTER — Other Ambulatory Visit: Payer: Self-pay

## 2014-08-29 ENCOUNTER — Other Ambulatory Visit: Payer: Self-pay

## 2014-08-29 MED ORDER — ROSUVASTATIN CALCIUM 40 MG PO TABS
40.0000 mg | ORAL_TABLET | Freq: Every day | ORAL | Status: DC
Start: 2014-08-29 — End: 2014-11-20

## 2014-08-31 ENCOUNTER — Telehealth: Payer: Self-pay

## 2014-08-31 NOTE — Telephone Encounter (Signed)
Pre visit cal completed. 

## 2014-09-01 ENCOUNTER — Ambulatory Visit (INDEPENDENT_AMBULATORY_CARE_PROVIDER_SITE_OTHER): Payer: Medicare Other | Admitting: Internal Medicine

## 2014-09-01 ENCOUNTER — Other Ambulatory Visit: Payer: Self-pay

## 2014-09-01 ENCOUNTER — Encounter: Payer: Self-pay | Admitting: Internal Medicine

## 2014-09-01 VITALS — BP 126/78 | HR 80 | Temp 97.9°F | Ht 69.0 in | Wt 309.0 lb

## 2014-09-01 DIAGNOSIS — J9801 Acute bronchospasm: Secondary | ICD-10-CM

## 2014-09-01 DIAGNOSIS — M19031 Primary osteoarthritis, right wrist: Secondary | ICD-10-CM

## 2014-09-01 DIAGNOSIS — E785 Hyperlipidemia, unspecified: Secondary | ICD-10-CM | POA: Diagnosis not present

## 2014-09-01 DIAGNOSIS — J441 Chronic obstructive pulmonary disease with (acute) exacerbation: Secondary | ICD-10-CM

## 2014-09-01 DIAGNOSIS — I10 Essential (primary) hypertension: Secondary | ICD-10-CM

## 2014-09-01 DIAGNOSIS — Z0000 Encounter for general adult medical examination without abnormal findings: Secondary | ICD-10-CM

## 2014-09-01 DIAGNOSIS — F32A Depression, unspecified: Secondary | ICD-10-CM

## 2014-09-01 DIAGNOSIS — F329 Major depressive disorder, single episode, unspecified: Secondary | ICD-10-CM

## 2014-09-01 DIAGNOSIS — R079 Chest pain, unspecified: Secondary | ICD-10-CM

## 2014-09-01 LAB — BASIC METABOLIC PANEL
BUN: 17 mg/dL (ref 6–23)
CHLORIDE: 107 meq/L (ref 96–112)
CO2: 20 meq/L (ref 19–32)
Calcium: 9.8 mg/dL (ref 8.4–10.5)
Creat: 0.93 mg/dL (ref 0.50–1.10)
Glucose, Bld: 155 mg/dL — ABNORMAL HIGH (ref 70–99)
Potassium: 4.2 mEq/L (ref 3.5–5.3)
Sodium: 139 mEq/L (ref 135–145)

## 2014-09-01 LAB — CBC WITH DIFFERENTIAL/PLATELET
BASOS ABS: 0.1 10*3/uL (ref 0.0–0.1)
BASOS PCT: 1 % (ref 0–1)
EOS PCT: 2 % (ref 0–5)
Eosinophils Absolute: 0.1 10*3/uL (ref 0.0–0.7)
HCT: 37.8 % (ref 36.0–46.0)
Hemoglobin: 12.9 g/dL (ref 12.0–15.0)
Lymphocytes Relative: 34 % (ref 12–46)
Lymphs Abs: 1.9 10*3/uL (ref 0.7–4.0)
MCH: 29.1 pg (ref 26.0–34.0)
MCHC: 34.1 g/dL (ref 30.0–36.0)
MCV: 85.1 fL (ref 78.0–100.0)
MONO ABS: 0.5 10*3/uL (ref 0.1–1.0)
MPV: 10.9 fL (ref 8.6–12.4)
Monocytes Relative: 9 % (ref 3–12)
NEUTROS ABS: 3 10*3/uL (ref 1.7–7.7)
Neutrophils Relative %: 54 % (ref 43–77)
PLATELETS: 298 10*3/uL (ref 150–400)
RBC: 4.44 MIL/uL (ref 3.87–5.11)
RDW: 14.5 % (ref 11.5–15.5)
WBC: 5.6 10*3/uL (ref 4.0–10.5)

## 2014-09-01 LAB — ALT: ALT: 18 U/L (ref 0–35)

## 2014-09-01 LAB — AST: AST: 18 U/L (ref 0–37)

## 2014-09-01 MED ORDER — CARVEDILOL 25 MG PO TABS: 25.0000 mg | ORAL_TABLET | Freq: Two times a day (BID) | ORAL | Status: DC

## 2014-09-01 MED ORDER — DILTIAZEM HCL ER BEADS 360 MG PO CP24: 360.0000 mg | ORAL_CAPSULE | Freq: Every day | ORAL | Status: DC

## 2014-09-01 MED ORDER — BUDESONIDE-FORMOTEROL FUMARATE 160-4.5 MCG/ACT IN AERO: 2.0000 | INHALATION_SPRAY | Freq: Two times a day (BID) | RESPIRATORY_TRACT | Status: DC

## 2014-09-01 MED ORDER — TELMISARTAN 40 MG PO TABS: 40.0000 mg | ORAL_TABLET | Freq: Every day | ORAL | Status: DC

## 2014-09-01 NOTE — Progress Notes (Signed)
Pre visit review using our clinic review tool, if applicable. No additional management support is needed unless otherwise documented below in the visit note. 

## 2014-09-01 NOTE — Assessment & Plan Note (Signed)
saw orthopedic surgery for wrist pain, was prescribed topical NSAIDs, better. The only other problem that is currently preventing   her from exercise is back pain

## 2014-09-01 NOTE — Assessment & Plan Note (Signed)
Continue Crestor, check labs

## 2014-09-01 NOTE — Assessment & Plan Note (Signed)
Refill medications, occasionally wheezing, no cough. Has DOE, a chronic problem

## 2014-09-01 NOTE — Assessment & Plan Note (Signed)
At this point, she is doing okay emotionally on no medications

## 2014-09-01 NOTE — Assessment & Plan Note (Signed)
Tdap 1-14 Pneumonia 1-14 prevnar  Provided 10/2013 zostavax rx provided before    PAP per Dr Etter Sjogren , last 12-2011 (-) [facility] 09-2013(-)  Cscope 2013, next 10 years DEXA (-) 2010 and 11-2013 Diet and exercise discussed

## 2014-09-01 NOTE — Progress Notes (Signed)
Subjective:    Patient ID: [name], female    DOB: 1951/01/19, 64 y.o.   MRN: [id number]  DOS:  09/01/2014 Type of visit - description :    Here for Medicare AWV:  1. Risk factors based on Past M, S, F history: reviewed 2. Physical Activities:  Sedentary other than some home chores 3. Depression/mood: mood ok at this point    4. Hearing:  No problemss noted or reported   5. ADL's:  Independent, doesn't know to drive 6. Fall Risk: had a fall 3 months ago getting up from the sofa, no major injury, counseled    7. home Safety: does feel safe at home   8. Height, weight, & visual acuity: see VS, vision reportedly normal, sees the eye doctor yearly, close to due for a office visit  9. Counseling: provided 10. Labs ordered based on risk factors: if needed   11. Referral Coordination: if needed 12. Care Plan, see assessment and plan   13. Cognitive Assessment: Motor skills normal, cognition affected by low literacy 14. Care team updated  15. End of life care discussed , already has some info, thinking about her son Lennette Bihari)  to be her POA    In addition, today we discussed the following:  History of bronchospasm, on medications as needed, symptoms < than baseline. Hyperlipidemia, reports good compliance with Crestor Hypertension, reports good compliance with medications, does not take ambulatory BPs "I don't know how to use the machin" DJD, currently the only pain she has is back pain on and off. Had right wrist pain, status post ortho visit, doing better     Review of Systems  Constitutional: No fever, chills. No unexplained wt changes. No unusual sweats HEENT: No dental problems, ear discharge, facial swelling, voice changes. No eye discharge, redness or intolerance to light Respiratory: No wheezing or difficulty breathing. No cough , mucus production Cardiovascular: no leg swelling or palpitations; has chest pain on and off for years, at baseline GI: no nausea,  vomiting, diarrhea or abdominal pain.   Yesterday, saw few drops of red blood when she wiped after a BM, history of hemorrhoids. No dysphagia   Endocrine: No polyphagia, polyuria or polydipsia GU: No dysuria, gross hematuria, difficulty urinating. No urinary urgency or frequency. Musculoskeletal: No joint swellings ,  aches and pains at baseline, currently only having back pain  Skin: No change in the color of the skin, palor or rash Allergic, immunologic: No environmental allergies or food allergies Neurological: No dizziness or syncope. No headaches. No diplopia, slurred speech, motor deficits, facial numbness Hematological: No enlarged lymph nodes, easy bruising or bleeding Psychiatry: No suicidal ideas, hallucinations, behavior problems or confusion.    Past Medical History  Diagnosis Date  . HYPERLIPIDEMIA 01/11/2009  . DEPRESSION 09/26/2008  . HYPERTENSION 10/06/2006  . DEGENERATIVE JOINT DISEASE 10/06/2006  . Pain in joint, multiple sites 11/10/2006  . INSOMNIA 09/26/2008  . CHEST PAIN 11/18/2007  . Diverticulosis     [id number]  . Internal hemorrhoids   . OBSTRUCTIVE SLEEP APNEA 06/23/2008    Severe OSA per sleep study 2010, Rx a CPAP  . DIABETES MELLITUS 10/06/2006  . UNSPECIFIED ANEMIA 12/10/2009  . GERD (gastroesophageal reflux disease) 07/25/2013  . Bronchospasm 05/28/2012    Past Surgical History  Procedure Laterality Date  . Right knee replacement  2005  . 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 Colonoscopy  08/01/2011    Procedure: COLONOSCOPY;  Surgeon: Inda Castle, MD;  Location:  [facility];  Service: Endoscopy;  Laterality: N/A;    History   Social History  . Marital Status: Widowed    Spouse Name: N/A  . Number of Children: 4  . Years of Education: N/A   Occupational History  . disability    Social History Main Topics  . Smoking status: Former Smoker -- 0.20 packs/day for 4 years    Types: Cigarettes    Quit date: 05/13/1995  . Smokeless tobacco: Never Used  .  Alcohol Use: Yes     Comment: Rarely  . Drug Use: No  . Sexual Activity: Not on file   Other Topics Concern  . Not on file   Social History Narrative   Widow , lives by herself     Family History  Problem Relation Age of Onset  . Asthma Mother   . Stroke Mother   . Diabetes Other     M, B, S  . Hypertension Sister     M, S,B  . Pancreatic cancer Brother   . Colon cancer Neg Hx   . Prostate cancer Neg Hx   . Breast cancer Neg Hx        Medication List       This list is accurate as of: 09/01/14 11:59 PM.  Always use your most recent med list.               ACCU-CHEK AVIVA PLUS W/DEVICE Kit  1 Device by Does not apply route once.     ACCU-CHEK FASTCLIX LANCETS Misc  TEST TWICE A DAY     albuterol 108 (90 BASE) MCG/ACT inhaler  Commonly known as:  PROVENTIL HFA;VENTOLIN HFA  Inhale 2 puffs into the lungs every 4 (four) hours as needed for wheezing.     aspirin 325 MG tablet  Take 325 mg by mouth daily.     budesonide-formoterol 160-4.5 MCG/ACT inhaler  Commonly known as:  SYMBICORT  Inhale 2 puffs into the lungs 2 (two) times daily.     carvedilol 25 MG tablet  Commonly known as:  COREG  Take 1 tablet (25 mg total) by mouth 2 (two) times daily.     Diclofenac Sodium 2 % Soln  Place 1 packet onto the skin 2 (two) times daily.     diltiazem 360 MG 24 hr capsule  Commonly known as:  TAZTIA XT  Take 1 capsule (360 mg total) by mouth daily.     glucose blood test strip  Commonly known as:  ACCU-CHEK AVIVA PLUS  TEST twice a day     insulin lispro protamine-lispro (50-50) 100 UNIT/ML Susp injection  Commonly known as:  HUMALOG 50/50 MIX  Inject 0.42 mLs (42 Units total) into the skin 2 (two) times daily before a meal.     Insulin Pen Needle 29G X 12MM Misc  Use as directed three times daily     omeprazole 20 MG capsule  Commonly known as:  PRILOSEC  Take 1 capsule (20 mg total) by mouth daily.     potassium chloride 10 MEQ tablet  Commonly known  as:  K-DUR,KLOR-CON  Take 1 tablet (10 mEq total) by mouth daily.     rosuvastatin 40 MG tablet  Commonly known as:  CRESTOR  Take 1 tablet (40 mg total) by mouth daily.     telmisartan 40 MG tablet  Commonly known as:  MICARDIS  Take 1 tablet (40 mg total) by mouth daily.  Objective:   Physical Exam BP 126/78 mmHg  Pulse 80  Temp(Src) 97.9 F (36.6 C) (Oral)  Ht $R'5\' 9"'Mh$  (1.753 m)  Wt 309 lb (140.161 kg)  BMI 45.61 kg/m2  SpO2 98% General:   Well developed, well nourished . NAD.  HEENT:  Normocephalic . Face symmetric, atraumatic. No thyromegaly Lungs:  CTA B Normal respiratory effort, no intercostal retractions, no accessory muscle use. Heart: RRR,  no murmur.  Abdomen:  Not distended, soft, non-tender. No rebound or rigidity. No mass,organomegaly Muscle skeletal:   Trace pretibial edema bilaterally  Skin: Not pale. Not jaundice Neurologic:  alert & oriented X3.  Speech normal, gait appropriate for age and unassisted Psych--  Cooperative with normal attention span and concentration.  Behavior appropriate. No anxious or depressed appearing.      Assessment & Plan:

## 2014-09-01 NOTE — Assessment & Plan Note (Signed)
Symptoms well-controlled, continue with potassium, carvedilol, diltiazem, micardis

## 2014-09-01 NOTE — Patient Instructions (Signed)
Get your blood work before you leave     Come back to the office 6 months   for a routine check up        [place]reek cause injuries and can affect all age groups. It is possible to use preventive measures to significantly decrease the likelihood of falls. There are many simple measures which can make your home safer and prevent falls. OUTDOORS  Repair cracks and edges of walkways and driveways.  Remove high doorway thresholds.  Trim shrubbery on the main path into your home.  Have good outside lighting.  Clear walkways of tools, rocks, debris, and clutter.  Check that handrails are not broken and are securely fastened. Both sides of steps should have handrails.  Have leaves, snow, and ice cleared regularly.  Use sand or salt on walkways during winter months.  In the garage, clean up grease or oil spills. BATHROOM  Install night lights.  Install grab bars by the toilet and in the tub and shower.  Use non-skid mats or decals in the tub or shower.  Place a plastic non-slip stool in the shower to sit on, if needed.  Keep floors dry and clean up all water on the floor immediately.  Remove soap buildup in the tub or shower on a regular basis.  Secure bath mats with non-slip, double-sided rug tape.  Remove throw rugs and tripping hazards from the floors. BEDROOMS  Install night lights.  Make sure a bedside light is easy to reach.  Do not use oversized bedding.  Keep a telephone by your bedside.  Have a firm chair with side arms to use for getting dressed.  Remove throw rugs and tripping hazards from the floor. KITCHEN  Keep handles on pots and pans turned toward the center of the stove. Use back burners when possible.  Clean up spills quickly and allow time for drying.  Avoid walking on wet floors.  Avoid hot utensils and knives.  Position shelves so they are not too high or low.  Place commonly used objects within easy  reach.  If necessary, use a sturdy step stool with a grab bar when reaching.  Keep electrical cables out of the way.  Do not use floor polish or wax that makes floors slippery. If you must use wax, use non-skid floor wax.  Remove throw rugs and tripping hazards from the floor. STAIRWAYS  Never leave objects on stairs.  Place handrails on both sides of stairways and use them. Fix any loose handrails. Make sure handrails on both sides of the stairways are as long as the stairs.  Check carpeting to make sure it is firmly attached along stairs. Make repairs to worn or loose carpet promptly.  Avoid placing throw rugs at the top or bottom of stairways, or properly secure the rug with carpet tape to prevent slippage. Get rid of throw rugs, if possible.  Have an electrician put in a light switch at the top and bottom of the stairs. OTHER FALL PREVENTION TIPS  Wear low-heel or rubber-soled shoes that are supportive and fit well. Wear closed toe shoes.  When using a stepladder, make sure it is fully opened and both spreaders are firmly locked. Do not climb a closed stepladder.  Add color or contrast paint or tape to grab bars and handrails in your home. Place contrasting color strips on first and last steps.  Learn and use mobility aids as needed. Install an electrical emergency response system.  Turn on lights to avoid dark areas. Replace light bulbs that burn out immediately. Get light switches that glow.  Arrange furniture to create clear pathways. Keep furniture in the same place.  Firmly attach carpet with non-skid or double-sided tape.  Eliminate uneven floor surfaces.  Select a carpet pattern that does not visually hide the edge of steps.  Be aware of all pets. OTHER HOME SAFETY TIPS  Set the water temperature for 120 F (48.8 C).  Keep emergency numbers on or near the telephone.  Keep smoke detectors on every level of the home and near sleeping areas. Document Released:  04/18/2002 Document Revised: 10/28/2011 Document Reviewed: 07/18/2011 [facility] Patient Information 2015 [place], [place]. This information is not intended to replace advice given to you by your health care provider. Make sure you discuss any questions you have with your health care provider.   Preventive Care for Adults Ages 20 and over  Blood pressure check.** / Every 1 to 2 years.  Lipid and cholesterol check.**/ Every 5 years beginning at age 87.  Lung cancer screening. / Every year if you are aged 67-80 years and have a 30-pack-year history of smoking and currently smoke or have quit within the past 15 years. Yearly screening is stopped once you have quit smoking for at least 15 years or develop a health problem that would prevent you from having lung cancer treatment.  Fecal occult blood test (FOBT) of stool. / Every year beginning at age 8 and continuing until age 43. You may not have to do this test if you get a colonoscopy every 10 years.  Flexible sigmoidoscopy** or colonoscopy.** / Every 5 years for a flexible sigmoidoscopy or every 10 years for a colonoscopy beginning at age 27 and continuing until age 20.  Hepatitis C blood test.** / For all people born from [birth year] through 1965 and any individual with known risks for hepatitis C.  Abdominal aortic aneurysm (AAA) screening.** / A one-time screening for ages 2 to 35 years who are current or former smokers.  Skin self-exam. / Monthly.  Influenza vaccine. / Every year.  Tetanus, diphtheria, and acellular pertussis (Tdap/Td) vaccine.** / 1 dose of Td every 10 years.  Varicella vaccine.** / Consult your health care provider.  Zoster vaccine.** / 1 dose for adults aged 56 years or older.  Pneumococcal 13-valent conjugate (PCV13) vaccine.** / Consult your health care provider.  Pneumococcal polysaccharide (PPSV23) vaccine.** / 1 dose for all adults aged 46 years and older.  Meningococcal vaccine.** / Consult your health care  provider.  Hepatitis A vaccine.** / Consult your health care provider.  Hepatitis B vaccine.** / Consult your health care provider.  Haemophilus influenzae type b (Hib) vaccine.** / Consult your health care provider. **Family history and personal history of risk and conditions may change your health care provider's recommendations. Document Released: 06/24/2001 Document Revised: 05/03/2013 Document Reviewed: 09/23/2010 [facility] Patient Information 2015 [place], [place]. This information is not intended to replace advice given to you by your health care provider. Make sure you discuss any questions you have with your health care provider.

## 2014-09-01 NOTE — Assessment & Plan Note (Signed)
Chronic chest pain, recommend to watch for changes in character. Call if intensity/frequency increases

## 2014-09-05 ENCOUNTER — Other Ambulatory Visit: Payer: Self-pay

## 2014-09-05 MED ORDER — GLUCOSE BLOOD VI STRP: ORAL_STRIP | Status: DC

## 2014-09-10 DIAGNOSIS — R0902 Hypoxemia: Secondary | ICD-10-CM | POA: Diagnosis not present

## 2014-09-10 DIAGNOSIS — G4733 Obstructive sleep apnea (adult) (pediatric): Secondary | ICD-10-CM | POA: Diagnosis not present

## 2014-09-12 ENCOUNTER — Other Ambulatory Visit: Payer: Self-pay

## 2014-09-12 DIAGNOSIS — Z1231 Encounter for screening mammogram for malignant neoplasm of breast: Secondary | ICD-10-CM

## 2014-09-19 DIAGNOSIS — L602 Onychogryphosis: Secondary | ICD-10-CM | POA: Diagnosis not present

## 2014-09-19 DIAGNOSIS — E1151 Type 2 diabetes mellitus with diabetic peripheral angiopathy without gangrene: Secondary | ICD-10-CM | POA: Diagnosis not present

## 2014-09-19 DIAGNOSIS — L84 Corns and callosities: Secondary | ICD-10-CM | POA: Diagnosis not present

## 2014-09-27 ENCOUNTER — Ambulatory Visit
Admission: RE | Admit: 2014-09-27 | Discharge: 2014-09-27 | Disposition: A | Discharge status: Home / Self Care | Payer: Medicare Other | Source: Ambulatory Visit

## 2014-09-27 DIAGNOSIS — Z1231 Encounter for screening mammogram for malignant neoplasm of breast: Secondary | ICD-10-CM | POA: Diagnosis not present

## 2014-10-02 ENCOUNTER — Telehealth: Payer: Self-pay | Admitting: Pulmonary Disease

## 2014-10-02 NOTE — Telephone Encounter (Signed)
Pt is aware of KC's recommendation. Recall has been placed for OV with VS in August. Nothing further was needed.

## 2014-10-02 NOTE — Telephone Encounter (Signed)
Would have her see Dr. Halford Chessman.

## 2014-10-02 NOTE — Telephone Encounter (Signed)
Spoke with pt. Advised her that I would get this message to [facility] to see who he recommends for her. She agreed.  [facility] - please advise. Thanks.

## 2014-10-11 DIAGNOSIS — G4733 Obstructive sleep apnea (adult) (pediatric): Secondary | ICD-10-CM | POA: Diagnosis not present

## 2014-10-11 DIAGNOSIS — R0902 Hypoxemia: Secondary | ICD-10-CM | POA: Diagnosis not present

## 2014-10-17 ENCOUNTER — Other Ambulatory Visit: Payer: Self-pay

## 2014-10-17 MED ORDER — INSULIN LISPRO PROT & LISPRO (50-50 MIX) 100 UNIT/ML ~~LOC~~ SUSP: 42.0000 [IU] | Freq: Two times a day (BID) | SUBCUTANEOUS | Status: DC

## 2014-10-18 DIAGNOSIS — H10413 Chronic giant papillary conjunctivitis, bilateral: Secondary | ICD-10-CM | POA: Diagnosis not present

## 2014-10-18 DIAGNOSIS — H40053 Ocular hypertension, bilateral: Secondary | ICD-10-CM | POA: Diagnosis not present

## 2014-10-18 DIAGNOSIS — E119 Type 2 diabetes mellitus without complications: Secondary | ICD-10-CM | POA: Diagnosis not present

## 2014-10-18 DIAGNOSIS — H04123 Dry eye syndrome of bilateral lacrimal glands: Secondary | ICD-10-CM | POA: Diagnosis not present

## 2014-10-18 DIAGNOSIS — H2513 Age-related nuclear cataract, bilateral: Secondary | ICD-10-CM | POA: Diagnosis not present

## 2014-10-18 LAB — HM DIABETES EYE EXAM

## 2014-10-20 ENCOUNTER — Ambulatory Visit: Payer: Medicare Other | Admitting: Endocrinology

## 2014-10-31 ENCOUNTER — Encounter: Payer: Medicare Other | Admitting: Internal Medicine

## 2014-11-10 DIAGNOSIS — R0902 Hypoxemia: Secondary | ICD-10-CM | POA: Diagnosis not present

## 2014-11-10 DIAGNOSIS — G4733 Obstructive sleep apnea (adult) (pediatric): Secondary | ICD-10-CM | POA: Diagnosis not present

## 2014-11-14 ENCOUNTER — Ambulatory Visit (INDEPENDENT_AMBULATORY_CARE_PROVIDER_SITE_OTHER): Payer: Medicare Other | Admitting: Endocrinology

## 2014-11-14 ENCOUNTER — Encounter: Payer: Self-pay | Admitting: Endocrinology

## 2014-11-14 VITALS — BP 132/88 | HR 84 | Temp 97.7°F | Ht 69.0 in | Wt 317.0 lb

## 2014-11-14 DIAGNOSIS — E119 Type 2 diabetes mellitus without complications: Secondary | ICD-10-CM

## 2014-11-14 LAB — POCT GLYCOSYLATED HEMOGLOBIN (HGB A1C): Hemoglobin A1C: 8.6

## 2014-11-14 MED ORDER — INSULIN LISPRO PROT & LISPRO (50-50 MIX) 100 UNIT/ML ~~LOC~~ SUSP: 50.0000 [IU] | Freq: Two times a day (BID) | SUBCUTANEOUS | Status: DC

## 2014-11-14 NOTE — Patient Instructions (Addendum)
Please make a follow-up appointment in 3 months.   On this type of insulin schedule, you should eat meals on a regular schedule.  If a meal is missed or significantly delayed, your blood sugar could go low. check your blood sugar 2 times a day.  vary the time of day when you check, between before the 3 meals, and at bedtime.  also check if you have symptoms of your blood sugar being too high or too low.  please keep a record of the readings and bring it to your next appointment here.  please call us sooner if your blood sugar goes below 70, or if it stays over 200.    Please increase the insulin to 50 units twice a day (just before the first and last meals of the day).  However, on Sunday mornings, take just 20 units.

## 2014-11-14 NOTE — Progress Notes (Signed)
Subjective:    Patient ID: [name], female    DOB: [birth year], 64 y.o.   MRN: [id number]  HPI Pt returns for f/u of diabetes mellitus: DM type: Insulin-requiring type 2 Dx'ed: 9458 Complications: polyneuropathy Therapy: insulin since 2004 GDM: never DKA: never Severe hypoglycemia: never Pancreatitis: never Other: she declines weight-loss surgery; she did not achieve good glycemic control with multiple daily injections, but she has done better with the simpler BID insulin regimen.  She has been advised to take less insulin on _0 08,2005  . Internal hemorrhoids   . OBSTRUCTIVE SLEEP APNEA 06/23/2008    Severe OSA per sleep study 2010, Rx a CPAP  . DIABETES MELLITUS 10/06/2006  . UNSPECIFIED ANEMIA 12/10/2009  . GERD (gastroesophageal reflux disease) 07/25/2013  . Bronchospasm 05/28/2012    Past Surgical History  Procedure Laterality Date  . Right knee replacement  2005  . 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 Colonoscopy  08/01/2011    Procedure: COLONOSCOPY;  Surgeon: Inda Castle, MD;  Location: [facility];  Service: Endoscopy;  Laterality: N/A;    History   Social History  . Marital Status: Widowed    Spouse Name: N/A  . Number of Children: 4  . Years of Education: N/A   Occupational History  . disability    Social History Main Topics  . Smoking status: Former Smoker -- 0.20 packs/day for 4 years    Types: Cigarettes    Quit date: 05/13/1995  . Smokeless tobacco: Never Used  . Alcohol Use: Yes     Comment:  Rarely  . Drug Use: No  . Sexual Activity: Not on file   Other Topics Concern  . Not on file   Social History Narrative   Widow , lives by herself    Current Outpatient Prescriptions on File Prior to Visit  Medication Sig Dispense Refill  . ACCU-CHEK FASTCLIX LANCETS MISC TEST TWICE A DAY 102 each 12  . albuterol (PROVENTIL HFA;VENTOLIN HFA) 108 (90 BASE) MCG/ACT inhaler Inhale 2 puffs into the lungs every 4 (four) hours as needed for wheezing. 1 Inhaler 6  . aspirin 325 MG tablet Take 325 mg by mouth daily.      . Blood Glucose Monitoring Suppl (ACCU-CHEK AVIVA PLUS) W/DEVICE KIT 1 Device by Does not apply route once. 1 kit 0  . budesonide-formoterol (SYMBICORT) 160-4.5 MCG/ACT inhaler Inhale 2 puffs into the lungs 2 (two) times daily. 10.2 g 5  . carvedilol (COREG) 25 MG tablet Take 1 tablet (25 mg total) by mouth 2 (two) times daily. 180 tablet 1  . Diclofenac Sodium 2 % SOLN Place 1 packet onto the skin 2 (two) times daily.    Marland Kitchen diltiazem (TAZTIA XT) 360 MG 24 hr capsule Take 1 capsule (360 mg total) by mouth daily. 90 capsule 1  . glucose blood (ACCU-CHEK AVIVA PLUS) test strip TEST twice a day and lancets 2/day 100 each 12  . Insulin Pen Needle 29G X 12MM MISC Use as directed three times daily  100 each 3  . omeprazole (PRILOSEC) 20 MG capsule Take 1 capsule (20 mg total) by mouth daily. 30 capsule 3  . potassium chloride (K-DUR,KLOR-CON) 10 MEQ tablet Take 1 tablet (10 mEq total) by mouth daily. 90 tablet 1  . rosuvastatin (CRESTOR) 40 MG tablet Take 1 tablet (40 mg total) by mouth daily. 30 tablet 2  . telmisartan (MICARDIS) 40 MG tablet Take 1 tablet (40 mg total) by mouth daily. 90 tablet 1   No current facility-administered medications on file prior to visit.    No Known Allergies  Family History  Problem Relation Age of Onset  . Asthma Mother   . Stroke Mother   . Diabetes Other     M, B, S  . Hypertension Sister     M, S,B  . Pancreatic cancer Brother   . Colon  cancer Neg Hx   . Prostate cancer Neg Hx   . Breast cancer Neg Hx     BP 132/88 mmHg  Pulse 84  Temp(Src) 97.7 F (36.5 C) (Oral)  Ht _0  (1.753 m)  Wt 317 lb (143.79 kg)  BMI 46.79 kg/m2  SpO2 95%  Review of Systems She denies hypoglycemia.    Objective:   Physical Exam VITAL SIGNS:  See vs page GENERAL: no distress Pulses: dorsalis pedis intact bilat.   MSK: no deformity of the feet CV: trace bilat leg edema Skin:  no ulcer on the feet.  normal color and temp on the feet. Neuro: sensation is intact to touch on the feet    A1c=8.6%    Assessment & Plan:  DM: glycemic control is worse.  Patient is advised the following: Patient Instructions  Please make a follow-up appointment in 3 months.   On this type of insulin schedule, you should eat meals on a regular schedule.  If a meal is missed or significantly delayed, your blood sugar could go low. check your blood sugar 2 times a day.  vary the time of day when you check, between before the 3 meals, and at bedtime.  also check if you have symptoms of your blood sugar being too high or too low.  please keep a record of the readings and bring it to your next appointment here.  please call us sooner if your blood sugar goes below 70, or if it stays over 200.    Please increase the insulin to 50 units twice a day (just before the first and last meals of the day).  However, on Sunday mornings, take just 20 units.

## 2014-11-17 ENCOUNTER — Telehealth: Payer: Self-pay

## 2014-11-17 NOTE — Telephone Encounter (Signed)
Gave pt her results and she understands them

## 2014-11-20 ENCOUNTER — Other Ambulatory Visit: Payer: Self-pay

## 2014-11-20 MED ORDER — ROSUVASTATIN CALCIUM 40 MG PO TABS: 40.0000 mg | ORAL_TABLET | Freq: Every day | ORAL | Status: DC

## 2014-12-11 DIAGNOSIS — G4733 Obstructive sleep apnea (adult) (pediatric): Secondary | ICD-10-CM | POA: Diagnosis not present

## 2014-12-11 DIAGNOSIS — R0902 Hypoxemia: Secondary | ICD-10-CM | POA: Diagnosis not present

## 2015-01-01 ENCOUNTER — Ambulatory Visit: Payer: Self-pay | Admitting: Adult Health

## 2015-01-02 ENCOUNTER — Ambulatory Visit (INDEPENDENT_AMBULATORY_CARE_PROVIDER_SITE_OTHER): Payer: Medicare Other | Admitting: Adult Health

## 2015-01-02 ENCOUNTER — Encounter: Payer: Self-pay | Admitting: Adult Health

## 2015-01-02 VITALS — BP 116/64 | HR 82 | Temp 98.3°F | Ht 69.0 in | Wt 320.0 lb

## 2015-01-02 DIAGNOSIS — G4734 Idiopathic sleep related nonobstructive alveolar hypoventilation: Secondary | ICD-10-CM | POA: Insufficient documentation

## 2015-01-02 DIAGNOSIS — J4520 Mild intermittent asthma, uncomplicated: Secondary | ICD-10-CM | POA: Diagnosis not present

## 2015-01-02 DIAGNOSIS — G4733 Obstructive sleep apnea (adult) (pediatric): Secondary | ICD-10-CM

## 2015-01-02 DIAGNOSIS — J45909 Unspecified asthma, uncomplicated: Secondary | ICD-10-CM | POA: Insufficient documentation

## 2015-01-02 NOTE — Assessment & Plan Note (Signed)
Controlled on o2 At bedtime

## 2015-01-02 NOTE — Progress Notes (Signed)
   Subjective:    Patient ID: [name], female    DOB: [birth year], 64 y.o.   MRN: [id number]  HPI 64 yo female , former smoker, with asthma and OSA -CPAP intolerant , with nocturnal desats on O2 at 2l/m At bedtime    01/02/2015 Follow up Asthma and OSA (CPAP intolerant ) on O2 for nocturnal desats.  Returns for 1 year follow up . Says over all she is doing ok.  No flare of cough or wheezing  Remains on Symbicort Twice daily for asthma . No increased Albuterol use.  No ER or hospital visits since last ov.  Does get winded with walking.  Has multiple chronic problems, DM , DJD and HTN  She feels tired often.  Has nasal dryness with occasional nose bleeds.  PVX and Prevnar utd. . She denies any chest pain, orthopnea , increased edema or fever.      Review of Systems Constitutional:   No  weight loss, night sweats,  Fevers, chills,  +fatigue, or  lassitude.  HEENT:   No headaches,  Difficulty swallowing,  Tooth/dental problems, or  Sore throat,                No sneezing, itching, ear ache,  +nasal congestion, post nasal drip,   CV:  No chest pain,  Orthopnea, PND, swelling in lower extremities, anasarca, dizziness, palpitations, syncope.   GI  No heartburn, indigestion, abdominal pain, nausea, vomiting, diarrhea, change in bowel habits, loss of appetite, bloody stools.   Resp  No excess mucus, no productive cough,  No non-productive cough,  No coughing up of blood.  No change in color of mucus.  No wheezing.  No chest wall deformity  Skin: no rash or lesions.  GU: no dysuria, change in color of urine, no urgency or frequency.  No flank pain, no hematuria   MS:  No joint pain or swelling.  No decreased range of motion.  No back pain.  Psych:  No change in mood or affect. No depression or anxiety.  No memory loss.         Objective:   Physical Exam GEN: A/Ox3; pleasant , NAD, morbidly obese   HEENT:  [place]/AT,  EACs-clear, TMs-wnl, NOSE-clear, THROAT-clear, no  lesions, no postnasal drip or exudate noted. Class 2-3 airway   NECK:  Supple w/ fair ROM; no JVD; normal carotid impulses w/o bruits; no thyromegaly or nodules palpated; no lymphadenopathy.  RESP  Clear  P & A; w/o, wheezes/ rales/ or rhonchi.no accessory muscle use, no dullness to percussion  CARD:  RRR, no m/r/g  , tr peripheral edema, pulses intact, no cyanosis or clubbing.  GI:   Soft & nt; nml bowel sounds; no organomegaly or masses detected.  Musco: Warm bil, no deformities or joint swelling noted.   Neuro: alert, no focal deficits noted.    Skin: Warm, no lesions or rashes         Assessment & Plan:

## 2015-01-02 NOTE — Assessment & Plan Note (Signed)
Pt reports Hx of asthma  Appears controlled on symbicort  No PFT/spirometry found in system  Consider spirometry on return

## 2015-01-02 NOTE — Patient Instructions (Addendum)
Continue on Symbicort 2 puffs Twice daily  , rinse after use.  Wear Oxygen 2l/m At bedtime  .  Work on weight loss.  Follow up Dr. Halford Chessman  In 1 year and As needed   Saling nasal spray /gel As needed  Nasal dryness

## 2015-01-02 NOTE — Progress Notes (Signed)
Reviewed and agree with assessment/plan. 

## 2015-01-02 NOTE — Assessment & Plan Note (Signed)
Cont to work on wt loss

## 2015-01-02 NOTE — Assessment & Plan Note (Signed)
Severe OSA intolerant to CPAP .  On nocturnal O2 for desats.   Plan  Continue O2 2l/m At bedtime

## 2015-01-03 ENCOUNTER — Ambulatory Visit: Payer: Medicare Other | Admitting: Pulmonary Disease

## 2015-01-03 DIAGNOSIS — L602 Onychogryphosis: Secondary | ICD-10-CM | POA: Diagnosis not present

## 2015-01-03 DIAGNOSIS — L84 Corns and callosities: Secondary | ICD-10-CM | POA: Diagnosis not present

## 2015-01-03 DIAGNOSIS — E1151 Type 2 diabetes mellitus with diabetic peripheral angiopathy without gangrene: Secondary | ICD-10-CM | POA: Diagnosis not present

## 2015-01-11 DIAGNOSIS — G4733 Obstructive sleep apnea (adult) (pediatric): Secondary | ICD-10-CM | POA: Diagnosis not present

## 2015-01-11 DIAGNOSIS — R0902 Hypoxemia: Secondary | ICD-10-CM | POA: Diagnosis not present

## 2015-02-02 ENCOUNTER — Other Ambulatory Visit: Payer: Self-pay | Admitting: Internal Medicine

## 2015-02-10 DIAGNOSIS — R0902 Hypoxemia: Secondary | ICD-10-CM | POA: Diagnosis not present

## 2015-02-10 DIAGNOSIS — G4733 Obstructive sleep apnea (adult) (pediatric): Secondary | ICD-10-CM | POA: Diagnosis not present

## 2015-02-14 ENCOUNTER — Ambulatory Visit: Payer: Self-pay | Admitting: Endocrinology

## 2015-02-14 ENCOUNTER — Encounter: Payer: Self-pay | Admitting: Endocrinology

## 2015-02-14 ENCOUNTER — Ambulatory Visit (INDEPENDENT_AMBULATORY_CARE_PROVIDER_SITE_OTHER): Payer: Medicare Other | Admitting: Endocrinology

## 2015-02-14 VITALS — BP 156/96 | HR 67 | Temp 98.7°F | Ht 69.0 in | Wt 315.0 lb

## 2015-02-14 DIAGNOSIS — E119 Type 2 diabetes mellitus without complications: Secondary | ICD-10-CM | POA: Diagnosis not present

## 2015-02-14 LAB — POCT GLYCOSYLATED HEMOGLOBIN (HGB A1C): Hemoglobin A1C: 7.8

## 2015-02-14 MED ORDER — TRAZODONE HCL 100 MG PO TABS: 100.0000 mg | ORAL_TABLET | Freq: Every day | ORAL | Status: DC

## 2015-02-14 NOTE — Patient Instructions (Addendum)
Please make a follow-up appointment in 4 months.   On this type of insulin schedule, you should eat meals on a regular schedule.  If a meal is missed or significantly delayed, your blood sugar could go low. check your blood sugar 2 times a day.  vary the time of day when you check, between before the 3 meals, and at bedtime.  also check if you have symptoms of your blood sugar being too high or too low.  please keep a record of the readings and bring it to your next appointment here.  please call us sooner if your blood sugar goes below 70, or if it stays over 200.    Please increase the insulin to 50 units twice a day (just before the first and last meals of the day).  However, on Sunday mornings, take just 10 units.   Please continue the same medication for blood pressure.   i have sent a prescription to your pharmacy, for the sleep and depression.  When you see Dr Larose Kells, please discuss depression symptoms, and your blood pressure.  While it is understandable that you would have depression after the loss of your son, Dr Larose Kells can still help you with these symptoms.

## 2015-02-14 NOTE — Progress Notes (Signed)
Subjective:    Patient ID: [name], female    DOB: 11/29/1950, 64 y.o.   MRN: [id number]  HPI Pt returns for f/u of diabetes mellitus: DM type: Insulin-requiring type 2 Dx'ed: 1624 Complications: polyneuropathy Therapy: insulin since 2004 GDM: never DKA: never Severe hypoglycemia: never Pancreatitis: never Other: she declines weight-loss surgery; she did not achieve good glycemic control with multiple daily injections, but she has done better with a BID insulin regimen.  She has been advised to take less insulin on $RemoveBe"Sunday mornings, due to hypoglycemia then.   Interval history: she brings a record of her cbg's which i have reviewed today.  It varies from 98-200.  There is no trend throughout the day, but it is lowest on sundays, even though she takes less each Sun AM.   Past Medical History  Diagnosis Date  . HYPERLIPIDEMIA 01/11/2009  . DEPRESSION 09/26/2008  . HYPERTENSION 10/06/2006  . DEGENERATIVE JOINT DISEASE 10/06/2006  . Pain in joint, multiple sites 11/10/2006  . INSOMNIA 09/26/2008  . CHEST PAIN 11/18/2007  . Diverticulosis     20"EmqWOzTml$ 08,2005  . Internal hemorrhoids   . OBSTRUCTIVE SLEEP APNEA 06/23/2008    Severe OSA per sleep study 2010, Rx a CPAP  . DIABETES MELLITUS 10/06/2006  . UNSPECIFIED ANEMIA 12/10/2009  . GERD (gastroesophageal reflux disease) 07/25/2013  . Bronchospasm 05/28/2012    Past Surgical History  Procedure Laterality Date  . Right knee replacement  2005  . 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 Colonoscopy  08/01/2011    Procedure: COLONOSCOPY;  Surgeon: Inda Castle, MD;  Location: [facility];  Service: Endoscopy;  Laterality: N/A;    Social History   Social History  . Marital Status: Widowed    Spouse Name: N/A  . Number of Children: 4  . Years of Education: N/A   Occupational History  . disability    Social History Main Topics  . Smoking status: Former Smoker -- 0.20 packs/day for 4 years    Types: Cigarettes    Quit date: 05/13/1995    . Smokeless tobacco: Never Used  . Alcohol Use: Yes     Comment: Rarely  . Drug Use: No  . Sexual Activity: Not on file   Other Topics Concern  . Not on file   Social History Narrative   Widow , lives by herself    Current Outpatient Prescriptions on File Prior to Visit  Medication Sig Dispense Refill  . ACCU-CHEK FASTCLIX LANCETS MISC TEST TWICE A DAY 102 each 12  . albuterol (PROVENTIL HFA;VENTOLIN HFA) 108 (90 BASE) MCG/ACT inhaler Inhale 2 puffs into the lungs every 4 (four) hours as needed for wheezing. 1 Inhaler 6  . aspirin 325 MG tablet Take 325 mg by mouth daily.      . Blood Glucose Monitoring Suppl (ACCU-CHEK AVIVA PLUS) W/DEVICE KIT 1 Device by Does not apply route once. 1 kit 0  . budesonide-formoterol (SYMBICORT) 160-4.5 MCG/ACT inhaler Inhale 2 puffs into the lungs 2 (two) times daily. 10.2 g 5  . carvedilol (COREG) 25 MG tablet Take 1 tablet (25 mg total) by mouth 2 (two) times daily. 180 tablet 1  . Diclofenac Sodium 2 % SOLN Place 1 packet onto the skin 2 (two) times daily.    Marland Kitchen diltiazem (TAZTIA XT) 360 MG 24 hr capsule Take 1 capsule (360 mg total) by mouth daily. 90 capsule 1  . glucose blood (ACCU-CHEK AVIVA PLUS) test strip TEST twice a day and lancets 2/day 100  each 12  . insulin lispro protamine-lispro (HUMALOG 50/50 MIX) (50-50) 100 UNIT/ML SUSP injection Inject 0.5 mLs (50 Units total) into the skin 2 (two) times daily before a meal. 40 mL 2  . Insulin Pen Needle 29G X 12MM MISC Use as directed three times daily 100 each 3  . omeprazole (PRILOSEC) 20 MG capsule Take 1 capsule (20 mg total) by mouth daily. (Patient taking differently: Take 20 mg by mouth daily as needed. ) 30 capsule 3  . potassium chloride (K-DUR,KLOR-CON) 10 MEQ tablet Take 1 tablet (10 mEq total) by mouth daily. 90 tablet 0  . rosuvastatin (CRESTOR) 40 MG tablet Take 1 tablet (40 mg total) by mouth daily. 90 tablet 1  . telmisartan (MICARDIS) 40 MG tablet Take 1 tablet (40 mg total) by  mouth daily. 90 tablet 1   No current facility-administered medications on file prior to visit.    No Known Allergies  Family History  Problem Relation Age of Onset  . Asthma Mother   . Stroke Mother   . Diabetes Other     M, B, S  . Hypertension Sister     M, S,B  . Pancreatic cancer Brother   . Colon cancer Neg Hx   . Prostate cancer Neg Hx   . Breast cancer Neg Hx     BP 156/96 mmHg  Pulse 67  Temp(Src) 98.7 F (37.1 C) (Oral)  Ht 5\' 9"  (1.753 m)  Wt 315 lb (142.883 kg)  BMI 46.50 kg/m2  SpO2 97%  Review of Systems Denies LOC.  She has anxiety and insomnia, since her son's recent death.     Objective:   Physical Exam VITAL SIGNS:  See vs page.  GENERAL: tearful (son recently died from a chronic illness).   Pulses: dorsalis pedis intact bilat.   MSK: no deformity of the feet CV: trace bilat leg edema.   Skin:  no ulcer on the feet.  normal color and temp on the feet. Neuro: sensation is intact to touch on the feet.  Ext: There is bilateral onychomycosis of the toenails.    A1c=7.8%    Assessment & Plan:  DM: this is the best control this pt should aim for, given this regimen, which does match insulin to her changing needs throughout the day Anxiety/insomnia, exac by bereavement HTN: prob exac by anxiety  Patient is advised the following: Patient Instructions  Please make a follow-up appointment in 4 months.   On this type of insulin schedule, you should eat meals on a regular schedule.  If a meal is missed or significantly delayed, your blood sugar could go low. check your blood sugar 2 times a day.  vary the time of day when you check, between before the 3 meals, and at bedtime.  also check if you have symptoms of your blood sugar being too high or too low.  please keep a record of the readings and bring it to your next appointment here.  please call us sooner if your blood sugar goes below 70, or if it stays over 200.    Please increase the insulin to 50  units twice a day (just before the first and last meals of the day).  However, on Sunday mornings, take just 10 units.   Please continue the same medication for blood pressure.   i have sent a prescription to your pharmacy, for the sleep and depression.  When you see Dr Larose Kells, please discuss depression symptoms, and your blood pressure.  While it is understandable that you would have depression after the loss of your son, Dr Larose Kells can still help you with these symptoms.

## 2015-02-22 ENCOUNTER — Encounter: Payer: Self-pay | Admitting: Internal Medicine

## 2015-02-22 ENCOUNTER — Ambulatory Visit (INDEPENDENT_AMBULATORY_CARE_PROVIDER_SITE_OTHER): Payer: Medicare Other | Admitting: Internal Medicine

## 2015-02-22 VITALS — BP 118/62 | HR 73 | Temp 97.8°F | Ht 69.0 in | Wt 316.2 lb

## 2015-02-22 DIAGNOSIS — Z114 Encounter for screening for human immunodeficiency virus [HIV]: Secondary | ICD-10-CM | POA: Diagnosis not present

## 2015-02-22 DIAGNOSIS — E119 Type 2 diabetes mellitus without complications: Secondary | ICD-10-CM | POA: Diagnosis not present

## 2015-02-22 DIAGNOSIS — Z09 Encounter for follow-up examination after completed treatment for conditions other than malignant neoplasm: Secondary | ICD-10-CM

## 2015-02-22 DIAGNOSIS — Z1159 Encounter for screening for other viral diseases: Secondary | ICD-10-CM

## 2015-02-22 DIAGNOSIS — E785 Hyperlipidemia, unspecified: Secondary | ICD-10-CM | POA: Diagnosis not present

## 2015-02-22 DIAGNOSIS — F32A Depression, unspecified: Secondary | ICD-10-CM

## 2015-02-22 DIAGNOSIS — I10 Essential (primary) hypertension: Secondary | ICD-10-CM

## 2015-02-22 DIAGNOSIS — F329 Major depressive disorder, single episode, unspecified: Secondary | ICD-10-CM

## 2015-02-22 DIAGNOSIS — Z23 Encounter for immunization: Secondary | ICD-10-CM | POA: Diagnosis not present

## 2015-02-22 LAB — LIPID PANEL
CHOL/HDL RATIO: 4
Cholesterol: 156 mg/dL (ref 0–200)
HDL: 43.5 mg/dL (ref >39.00)
LDL Cholesterol: 91 mg/dL (ref 0–99)
NONHDL: 112.74
Triglycerides: 110 mg/dL (ref 0.0–149.0)
VLDL: 22 mg/dL (ref 0.0–40.0)

## 2015-02-22 LAB — BASIC METABOLIC PANEL
BUN: 20 mg/dL (ref 6–23)
CALCIUM: 9.3 mg/dL (ref 8.4–10.5)
CO2: 27 meq/L (ref 19–32)
CREATININE: 1.07 mg/dL (ref 0.40–1.20)
Chloride: 103 mEq/L (ref 96–112)
GFR: 66.01 mL/min (ref >60.00)
GLUCOSE: 120 mg/dL — AB (ref 70–99)
Potassium: 4.1 mEq/L (ref 3.5–5.1)
SODIUM: 138 meq/L (ref 135–145)

## 2015-02-22 LAB — HEMOGLOBIN A1C: Hgb A1c MFr Bld: 7.8 % — ABNORMAL HIGH (ref 4.6–6.5)

## 2015-02-22 MED ORDER — ESCITALOPRAM OXALATE 10 MG PO TABS: 10.0000 mg | ORAL_TABLET | Freq: Every day | ORAL | Status: DC

## 2015-02-22 NOTE — Progress Notes (Signed)
Subjective:    Patient ID: [name], female    DOB: 1950-10-22, 64 y.o.   MRN: [id number]  DOS:  02/22/2015 Type of visit - description : Routine visit Interval history: Lost her 31 year old son in June, obviously distressed about it,   has not reach out for help. Feeling depressed. Denies suicidal ideas. Try trazodone prescribed per endocrinology a couple of days ago, did help her to sleep slightly better. She did have a single episode of right-sided back pain without radiation and she believes " it could have been trazodone" HTN: Good compliance of medication, no ambulatory BPs. BP today satisfactory  BP Readings from Last 3 Encounters:  02/22/15 118/62  02/14/15 156/96  01/02/15 116/64   High cholesterol: Due for labs, not fasting. Good compliance of medication.   Review of Systems  Continue with chronic unchanged chest pain, difficulty breathing, DOE. Denies nausea, vomiting, diarrhea, still have the right upper quadrant abdominal pain.  Past Medical History  Diagnosis Date  . HYPERLIPIDEMIA 01/11/2009  . DEPRESSION 09/26/2008  . HYPERTENSION 10/06/2006  . DEGENERATIVE JOINT DISEASE 10/06/2006  . Pain in joint, multiple sites 11/10/2006  . INSOMNIA 09/26/2008  . CHEST PAIN 11/18/2007  . Diverticulosis     5621,3086  . Internal hemorrhoids   . OBSTRUCTIVE SLEEP APNEA 06/23/2008    Severe OSA per sleep study 2010, Rx a CPAP  . DIABETES MELLITUS 10/06/2006  . UNSPECIFIED ANEMIA 12/10/2009  . GERD (gastroesophageal reflux disease) 07/25/2013  . Bronchospasm 05/28/2012    Past Surgical History  Procedure Laterality Date  . Right knee replacement  2005  . 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 Colonoscopy  08/01/2011    Procedure: COLONOSCOPY;  Surgeon: Inda Castle, MD;  Location: [facility];  Service: Endoscopy;  Laterality: N/A;    Social History   Social History  . Marital Status: Widowed    Spouse Name: N/A  . Number of Children: 4  . Years of Education: N/A    Occupational History  . disability    Social History Main Topics  . Smoking status: Former Smoker -- 0.20 packs/day for 4 years    Types: Cigarettes    Quit date: 05/13/1995  . Smokeless tobacco: Never Used  . Alcohol Use: Yes     Comment: Rarely  . Drug Use: No  . Sexual Activity: Not on file   Other Topics Concern  . Not on file   Social History Narrative   Widow , lives by herself        Medication List       This list is accurate as of: 02/22/15 11:54 AM.  Always use your most recent med list.               ACCU-CHEK AVIVA PLUS W/DEVICE Kit  1 Device by Does not apply route once.     ACCU-CHEK FASTCLIX LANCETS Misc  TEST TWICE A DAY     albuterol 108 (90 BASE) MCG/ACT inhaler  Commonly known as:  PROVENTIL HFA;VENTOLIN HFA  Inhale 2 puffs into the lungs every 4 (four) hours as needed for wheezing.     aspirin 325 MG tablet  Take 325 mg by mouth daily.     budesonide-formoterol 160-4.5 MCG/ACT inhaler  Commonly known as:  SYMBICORT  Inhale 2 puffs into the lungs 2 (two) times daily.     carvedilol 25 MG tablet  Commonly known as:  COREG  Take 1 tablet (25 mg total) by mouth 2 (two)  times daily.     Diclofenac Sodium 2 % Soln  Place 1 packet onto the skin 2 (two) times daily.     diltiazem 360 MG 24 hr capsule  Commonly known as:  TAZTIA XT  Take 1 capsule (360 mg total) by mouth daily.     glucose blood test strip  Commonly known as:  ACCU-CHEK AVIVA PLUS  TEST twice a day and lancets 2/day     insulin lispro protamine-lispro (50-50) 100 UNIT/ML Susp injection  Commonly known as:  HUMALOG 50/50 MIX  Inject 0.5 mLs (50 Units total) into the skin 2 (two) times daily before a meal.     Insulin Pen Needle 29G X 12MM Misc  Use as directed three times daily     omeprazole 20 MG capsule  Commonly known as:  PRILOSEC  Take 1 capsule (20 mg total) by mouth daily.     potassium chloride 10 MEQ tablet  Commonly known as:  K-DUR,KLOR-CON  Take 1  tablet (10 mEq total) by mouth daily.     rosuvastatin 40 MG tablet  Commonly known as:  CRESTOR  Take 1 tablet (40 mg total) by mouth daily.     telmisartan 40 MG tablet  Commonly known as:  MICARDIS  Take 1 tablet (40 mg total) by mouth daily.     traZODone 100 MG tablet  Commonly known as:  DESYREL  Take 1 tablet (100 mg total) by mouth at bedtime.           Objective:   Physical Exam BP 118/62 mmHg  Pulse 73  Temp(Src) 97.8 F (36.6 C) (Oral)  Ht _0  (1.753 m)  Wt 316 lb 4 oz (143.45 kg)  BMI 46.68 kg/m2  SpO2 94% General:   Well developed, well nourished . NAD.  HEENT:  Normocephalic . Face symmetric, atraumatic Lungs:  CTA B Normal respiratory effort, no intercostal retractions, no accessory muscle use. Heart: RRR,  no murmur.  No pretibial edema bilaterally  Skin: Not pale. Not jaundice Neurologic:  alert & oriented X3.  Speech normal, gait appropriate for age and unassisted Psych--    behavior appropriate. Tearful during part of the visit when we talked about her son     Assessment & Plan:   Assessment> DM per endocrinology HTN Hyperlipidemia Depression, insomnia (citalopram caused headache ? 05-2014) Asthma MSK: --DJD, saw ortho ~06-2014 --Multiple joint pains GERD Morbid obesity OSA dx 2010, severe, nocturnal desaturation, intolerant to CPAP, on nocturnal O2 DOE, chronic CP, chronic RUQ pain:: 2004--Cath neg, areterial LE dopplersneg 06-2003-- abd. u/s fatty liver 01-2005-- CP...neg u/s GB, neg HIDA 10-06--saw cards--cardiolite neg 12-2007 abnormal stress test Cath 02-01-08: normal coronaries   Plan HTN: BP today is very good, no change, check a BMP Hyperlipidemia: She is not fasting and due for a FLP, she has problems with transportation. Will check a FLP today and continue Crestor Depression: Patient is counseled to the best of my ability, I will recommend Lexapro and dc trazodone for now. Patient knows it may take several days for the  medication to start working. Counselors of the area provided. Primary care: Flu shot today, check hep C and HIV RTC 2 months to assess depression

## 2015-02-22 NOTE — Progress Notes (Signed)
Pre visit review using our clinic review tool, if applicable. No additional management support is needed unless otherwise documented below in the visit note. 

## 2015-02-22 NOTE — Patient Instructions (Signed)
Get your blood work before you leave   Stop trazodone Start Lexapro 1 tablet at bedtime  Next visit  for a checkup regards depression in 2 months, nonfasting, 15 min  Please schedule an appointment at the front desk

## 2015-02-23 DIAGNOSIS — Z09 Encounter for follow-up examination after completed treatment for conditions other than malignant neoplasm: Secondary | ICD-10-CM | POA: Insufficient documentation

## 2015-02-23 LAB — HIV ANTIBODY (ROUTINE TESTING W REFLEX): HIV: NONREACTIVE

## 2015-02-23 LAB — HEPATITIS C ANTIBODY: HCV AB: NEGATIVE

## 2015-02-23 NOTE — Assessment & Plan Note (Signed)
HTN: BP today is very good, no change, check a BMP Hyperlipidemia: She is not fasting and due for a FLP, she has problems with transportation. Will check a FLP today and continue Crestor Depression: Patient is counseled to the best of my ability, I will recommend Lexapro and dc trazodone for now. Patient knows it may take several days for the medication to start working. Counselors of the area provided. Primary care: Flu shot today, check hep C and HIV RTC 2 months to assess depression

## 2015-03-05 ENCOUNTER — Other Ambulatory Visit: Payer: Self-pay

## 2015-03-05 ENCOUNTER — Ambulatory Visit: Payer: Self-pay | Admitting: Internal Medicine

## 2015-03-05 MED ORDER — INSULIN PEN NEEDLE 29G X 12MM MISC: Status: DC

## 2015-03-06 ENCOUNTER — Ambulatory Visit: Payer: Self-pay | Admitting: Internal Medicine

## 2015-03-07 ENCOUNTER — Ambulatory Visit: Payer: Self-pay | Admitting: Internal Medicine

## 2015-03-13 DIAGNOSIS — R0902 Hypoxemia: Secondary | ICD-10-CM | POA: Diagnosis not present

## 2015-03-13 DIAGNOSIS — G4733 Obstructive sleep apnea (adult) (pediatric): Secondary | ICD-10-CM | POA: Diagnosis not present

## 2015-03-14 ENCOUNTER — Other Ambulatory Visit: Payer: Self-pay | Admitting: Internal Medicine

## 2015-04-04 DIAGNOSIS — E1351 Other specified diabetes mellitus with diabetic peripheral angiopathy without gangrene: Secondary | ICD-10-CM | POA: Diagnosis not present

## 2015-04-04 DIAGNOSIS — L602 Onychogryphosis: Secondary | ICD-10-CM | POA: Diagnosis not present

## 2015-04-04 DIAGNOSIS — L84 Corns and callosities: Secondary | ICD-10-CM | POA: Diagnosis not present

## 2015-04-12 DIAGNOSIS — R0902 Hypoxemia: Secondary | ICD-10-CM | POA: Diagnosis not present

## 2015-04-12 DIAGNOSIS — G4733 Obstructive sleep apnea (adult) (pediatric): Secondary | ICD-10-CM | POA: Diagnosis not present

## 2015-04-19 ENCOUNTER — Other Ambulatory Visit: Payer: Self-pay

## 2015-04-19 MED ORDER — INSULIN LISPRO PROT & LISPRO (50-50 MIX) 100 UNIT/ML ~~LOC~~ SUSP: 50.0000 [IU] | Freq: Two times a day (BID) | SUBCUTANEOUS | Status: DC

## 2015-04-24 ENCOUNTER — Ambulatory Visit: Payer: Self-pay | Admitting: Internal Medicine

## 2015-05-02 ENCOUNTER — Other Ambulatory Visit: Payer: Self-pay | Admitting: Internal Medicine

## 2015-05-09 ENCOUNTER — Ambulatory Visit: Payer: Self-pay | Admitting: Internal Medicine

## 2015-05-09 ENCOUNTER — Ambulatory Visit: Payer: Medicare Other | Admitting: Internal Medicine

## 2015-05-11 ENCOUNTER — Ambulatory Visit: Payer: Medicare Other | Admitting: Internal Medicine

## 2015-05-13 DIAGNOSIS — G4733 Obstructive sleep apnea (adult) (pediatric): Secondary | ICD-10-CM | POA: Diagnosis not present

## 2015-05-13 DIAGNOSIS — R0902 Hypoxemia: Secondary | ICD-10-CM | POA: Diagnosis not present

## 2015-05-15 ENCOUNTER — Telehealth: Payer: Self-pay | Admitting: Internal Medicine

## 2015-05-15 ENCOUNTER — Ambulatory Visit: Payer: Medicare Other | Admitting: Internal Medicine

## 2015-05-15 NOTE — Telephone Encounter (Signed)
Patient called at 11am and canceled her 2:30pm appointment due to transportation issues. Patient reschedueled charge or no charge

## 2015-05-15 NOTE — Telephone Encounter (Signed)
Repeated cancellations and no shows. Charge fee.

## 2015-05-25 ENCOUNTER — Ambulatory Visit (INDEPENDENT_AMBULATORY_CARE_PROVIDER_SITE_OTHER): Payer: Medicare Other | Admitting: Internal Medicine

## 2015-05-25 ENCOUNTER — Encounter: Payer: Self-pay | Admitting: Internal Medicine

## 2015-05-25 VITALS — BP 124/68 | HR 76 | Temp 98.0°F | Ht 69.0 in | Wt 310.0 lb

## 2015-05-25 DIAGNOSIS — L02214 Cutaneous abscess of groin: Secondary | ICD-10-CM | POA: Diagnosis not present

## 2015-05-25 DIAGNOSIS — F329 Major depressive disorder, single episode, unspecified: Secondary | ICD-10-CM

## 2015-05-25 DIAGNOSIS — F32A Depression, unspecified: Secondary | ICD-10-CM

## 2015-05-25 DIAGNOSIS — L0292 Furuncle, unspecified: Secondary | ICD-10-CM

## 2015-05-25 MED ORDER — CEPHALEXIN 500 MG PO CAPS: 500.0000 mg | ORAL_CAPSULE | Freq: Four times a day (QID) | ORAL | Status: DC

## 2015-05-25 MED ORDER — DOXYCYCLINE HYCLATE 100 MG PO TABS: 100.0000 mg | ORAL_TABLET | Freq: Two times a day (BID) | ORAL | Status: DC

## 2015-05-25 NOTE — Progress Notes (Signed)
Pre visit review using our clinic review tool, if applicable. No additional management support is needed unless otherwise documented below in the visit note. 

## 2015-05-25 NOTE — Progress Notes (Signed)
Subjective:    Patient ID: [name], female    DOB: 10-13-1950, 65 y.o.   MRN: [id number]  DOS:  05/25/2015 Type of visit - description : Follow-up Interval history:  Was seen with depression, took Lexapro temporarily, cause a headache and she had to discontinue it. At this point however she reports  is doing well emotionally and does not need help. Also, 3 days ago noted a boil at her groin, yesterday developed  pain. Last night she  "busted" it  and she saw d/c coming out. Denies fever chills.   Review of Systems   Past Medical History  Diagnosis Date  . HYPERLIPIDEMIA 01/11/2009  . DEPRESSION 09/26/2008  . HYPERTENSION 10/06/2006  . DEGENERATIVE JOINT DISEASE 10/06/2006  . Pain in joint, multiple sites 11/10/2006  . INSOMNIA 09/26/2008  . CHEST PAIN 11/18/2007  . Diverticulosis     3086,5784  . Internal hemorrhoids   . OBSTRUCTIVE SLEEP APNEA 06/23/2008    Severe OSA per sleep study 2010, Rx a CPAP  . DIABETES MELLITUS 10/06/2006  . UNSPECIFIED ANEMIA 12/10/2009  . GERD (gastroesophageal reflux disease) 07/25/2013  . Bronchospasm 05/28/2012    Past Surgical History  Procedure Laterality Date  . Right knee replacement  2005  . 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 Colonoscopy  08/01/2011    Procedure: COLONOSCOPY;  Surgeon: Inda Castle, MD;  Location: [facility];  Service: Endoscopy;  Laterality: N/A;    Social History   Social History  . Marital Status: Widowed    Spouse Name: N/A  . Number of Children: 4  . Years of Education: N/A   Occupational History  . disability    Social History Main Topics  . Smoking status: Former Smoker -- 0.20 packs/day for 4 years    Types: Cigarettes    Quit date: 05/13/1995  . Smokeless tobacco: Never Used  . Alcohol Use: Yes     Comment: Rarely  . Drug Use: No  . Sexual Activity: Not on file   Other Topics Concern  . Not on file   Social History Narrative   Widow , lives by herself        Medication List         This list is accurate as of: 05/25/15  4:10 PM.  Always use your most recent med list.               ACCU-CHEK AVIVA PLUS w/Device Kit  1 Device by Does not apply route once.     ACCU-CHEK FASTCLIX LANCETS Misc  TEST TWICE A DAY     albuterol 108 (90 Base) MCG/ACT inhaler  Commonly known as:  PROVENTIL HFA;VENTOLIN HFA  Inhale 2 puffs into the lungs every 4 (four) hours as needed for wheezing.     aspirin 325 MG tablet  Take 325 mg by mouth daily.     budesonide-formoterol 160-4.5 MCG/ACT inhaler  Commonly known as:  SYMBICORT  Inhale 2 puffs into the lungs 2 (two) times daily.     carvedilol 25 MG tablet  Commonly known as:  COREG  Take 1 tablet (25 mg total) by mouth 2 (two) times daily.     cephALEXin 500 MG capsule  Commonly known as:  KEFLEX  Take 1 capsule (500 mg total) by mouth 4 (four) times daily.     Diclofenac Sodium 2 % Soln  Place 1 packet onto the skin 2 (two) times daily.     diltiazem 360 MG 24 hr  capsule  Commonly known as:  TAZTIA XT  Take 1 capsule (360 mg total) by mouth daily.     doxycycline 100 MG tablet  Commonly known as:  VIBRA-TABS  Take 1 tablet (100 mg total) by mouth 2 (two) times daily.     glucose blood test strip  Commonly known as:  ACCU-CHEK AVIVA PLUS  TEST twice a day and lancets 2/day     insulin lispro protamine-lispro (50-50) 100 UNIT/ML Susp injection  Commonly known as:  HUMALOG 50/50 MIX  Inject 0.5 mLs (50 Units total) into the skin 2 (two) times daily before a meal.     Insulin Pen Needle 29G X 12MM Misc  Use as directed three times daily     omeprazole 20 MG capsule  Commonly known as:  PRILOSEC  Take 1 capsule (20 mg total) by mouth daily.     potassium chloride 10 MEQ tablet  Commonly known as:  K-DUR,KLOR-CON  take 1 tablet by mouth once daily     rosuvastatin 40 MG tablet  Commonly known as:  CRESTOR  Take 1 tablet (40 mg total) by mouth daily.     telmisartan 40 MG tablet  Commonly known as:   MICARDIS  Take 1 tablet (40 mg total) by mouth daily.           Objective:   Physical Exam  Skin:      BP 124/68 mmHg  Pulse 76  Temp(Src) 98 F (36.7 C) (Oral)  Ht '5\' 9"'$  (1.753 m)  Wt 310 lb (140.615 kg)  BMI 45.76 kg/m2  SpO2 98% General:   Well developed, well nourished . NAD.  HEENT:  Normocephalic . Face symmetric, atraumatic Neurologic:  alert & oriented X3.  Speech normal, gait appropriate for age and unassisted Psych--  Cognition and judgment appear intact.  Cooperative with normal attention span and concentration.  Behavior appropriate. No anxious or depressed appearing.      Assessment & Plan:   Assessment> DM per endocrinology HTN Hyperlipidemia Depression, insomnia (citalopram caused headache ? 05-2014) Asthma MSK: --DJD, saw ortho ~06-2014 --Multiple joint pains GERD Morbid obesity OSA dx 2010, severe, nocturnal desaturation, intolerant to CPAP, on nocturnal O2 DOE, chronic CP, chronic RUQ pain:: 2004--Cath neg, areterial LE dopplers (-) 06-2003-- abd. u/s fatty liver 01-2005-- CP...neg u/s GB, neg HIDA 10-06--saw cards--cardiolite neg 12-2007 abnormal stress test Cath 02-01-08: normal coronaries   Plan Depression: Took Lexapro temporarily, self discontinued it b/c HAse but now feels well. No further action needed at this time Groin abscess-infection: Diabetic patient with a skin infection, see physical exam, she is really high risk for complications. There is nothing obvious for me to drain but I will ask a surgeon to see ASAP. If she cannot be seen today, recommend her to go to the ER during the weekend. Cover w/ doxy-keflex, CX sent ; See AVS Addendum:  To be seen Monday 05-28-15  RTC ROV 4 months .

## 2015-05-25 NOTE — Patient Instructions (Signed)
Take the 2 antibiotics as prescribed for the next week  Warm compress to the infection  We are trying for you to see the surgeon today or Monday. If we are unable to get you seen:  please go to the ER if the infection is worse, you have fever, chills.  Schedule appointment to see me for a routine checkup in 4 months.

## 2015-05-27 LAB — WOUND CULTURE
Gram Stain: NONE SEEN
Gram Stain: NONE SEEN

## 2015-05-28 DIAGNOSIS — L02214 Cutaneous abscess of groin: Secondary | ICD-10-CM | POA: Diagnosis not present

## 2015-06-01 ENCOUNTER — Telehealth: Payer: Self-pay | Admitting: Endocrinology

## 2015-06-01 NOTE — Telephone Encounter (Signed)
Wells Guiles from [facility] spoke with Mrs. Barberio and wanted to make it aware that the patient was experiencing some symptoms   Symptoms: Numbness in thumb and feet   Please advise    Thank you

## 2015-06-01 NOTE — Telephone Encounter (Signed)
Please offer to move up next appt to next available

## 2015-06-01 NOTE — Telephone Encounter (Signed)
See note below to be advised, Thanks! 

## 2015-06-01 NOTE — Telephone Encounter (Signed)
Patient scheduled for on 06/18/2015.

## 2015-06-06 DIAGNOSIS — E1351 Other specified diabetes mellitus with diabetic peripheral angiopathy without gangrene: Secondary | ICD-10-CM | POA: Diagnosis not present

## 2015-06-06 DIAGNOSIS — L84 Corns and callosities: Secondary | ICD-10-CM | POA: Diagnosis not present

## 2015-06-06 DIAGNOSIS — L602 Onychogryphosis: Secondary | ICD-10-CM | POA: Diagnosis not present

## 2015-06-13 DIAGNOSIS — R0902 Hypoxemia: Secondary | ICD-10-CM | POA: Diagnosis not present

## 2015-06-13 DIAGNOSIS — G4733 Obstructive sleep apnea (adult) (pediatric): Secondary | ICD-10-CM | POA: Diagnosis not present

## 2015-06-15 ENCOUNTER — Other Ambulatory Visit: Payer: Self-pay | Admitting: Endocrinology

## 2015-06-15 ENCOUNTER — Other Ambulatory Visit: Payer: Self-pay

## 2015-06-15 MED ORDER — ACCU-CHEK SOFTCLIX LANCETS MISC: Status: DC

## 2015-06-18 ENCOUNTER — Ambulatory Visit (INDEPENDENT_AMBULATORY_CARE_PROVIDER_SITE_OTHER): Payer: Medicare Other | Admitting: Endocrinology

## 2015-06-18 ENCOUNTER — Encounter: Payer: Self-pay | Admitting: Endocrinology

## 2015-06-18 VITALS — BP 132/86 | HR 75 | Temp 98.2°F | Ht 69.0 in | Wt 310.0 lb

## 2015-06-18 DIAGNOSIS — E119 Type 2 diabetes mellitus without complications: Secondary | ICD-10-CM | POA: Diagnosis not present

## 2015-06-18 LAB — POCT GLYCOSYLATED HEMOGLOBIN (HGB A1C): Hemoglobin A1C: 7.5

## 2015-06-18 NOTE — Progress Notes (Signed)
Subjective:    Patient ID: [name], female    DOB: Jun 23, 1950, 65 y.o.   MRN: [id number]  HPI Pt returns for f/u of diabetes mellitus: DM type: Insulin-requiring type 2 Dx'ed: 5465 Complications: polyneuropathy Therapy: insulin since 2004 GDM: never DKA: never Severe hypoglycemia: never.  Pancreatitis: never.   Other: she declines weight-loss surgery; she did not achieve good glycemic control with multiple daily injections, but she has done better with a BID insulin regimen.  She has takes less insulin on "Sunday mornings, due to hypoglycemia then.   Interval history: she brings a record of her cbg's which i have reviewed today.  It varies from 100-200's.  There is no trend throughout the day, or day of the week.  pt states she feels well in general.  Past Medical History  Diagnosis Date  . HYPERLIPIDEMIA 01/11/2009  . DEPRESSION 09/26/2008  . HYPERTENSION 10/06/2006  . DEGENERATIVE JOINT DISEASE 10/06/2006  . Pain in joint, multiple sites 11/10/2006  . INSOMNIA 09/26/2008  . CHEST PAIN 11/18/2007  . Diverticulosis     20"$ 08,2005  . Internal hemorrhoids   . OBSTRUCTIVE SLEEP APNEA 06/23/2008    Severe OSA per sleep study 2010, Rx a CPAP  . DIABETES MELLITUS 10/06/2006  . UNSPECIFIED ANEMIA 12/10/2009  . GERD (gastroesophageal reflux disease) 07/25/2013  . Bronchospasm 05/28/2012    Past Surgical History  Procedure Laterality Date  . Right knee replacement  2005  . 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 Colonoscopy  08/01/2011    Procedure: COLONOSCOPY;  Surgeon: Inda Castle, MD;  Location: [facility];  Service: Endoscopy;  Laterality: N/A;    Social History   Social History  . Marital Status: Widowed    Spouse Name: N/A  . Number of Children: 4  . Years of Education: N/A   Occupational History  . disability    Social History Main Topics  . Smoking status: Former Smoker -- 0.20 packs/day for 4 years    Types: Cigarettes    Quit date: 05/13/1995  . Smokeless  tobacco: Never Used  . Alcohol Use: Yes     Comment: Rarely  . Drug Use: No  . Sexual Activity: Not on file   Other Topics Concern  . Not on file   Social History Narrative   Widow , lives by herself    Current Outpatient Prescriptions on File Prior to Visit  Medication Sig Dispense Refill  . ACCU-CHEK SOFTCLIX LANCETS lancets Use to check blood sugar 2 times per day. Dx Code: E11.9 100 each 12  . albuterol (PROVENTIL HFA;VENTOLIN HFA) 108 (90 BASE) MCG/ACT inhaler Inhale 2 puffs into the lungs every 4 (four) hours as needed for wheezing. 1 Inhaler 6  . aspirin 325 MG tablet Take 325 mg by mouth daily.      . Blood Glucose Monitoring Suppl (ACCU-CHEK AVIVA PLUS) W/DEVICE KIT 1 Device by Does not apply route once. 1 kit 0  . budesonide-formoterol (SYMBICORT) 160-4.5 MCG/ACT inhaler Inhale 2 puffs into the lungs 2 (two) times daily. 10.2 g 5  . carvedilol (COREG) 25 MG tablet Take 1 tablet (25 mg total) by mouth 2 (two) times daily. 180 tablet 1  . CRESTOR 40 MG tablet take 1 tablet by mouth once daily 90 tablet 1  . diltiazem (TAZTIA XT) 360 MG 24 hr capsule Take 1 capsule (360 mg total) by mouth daily. 90 capsule 1  . glucose blood (ACCU-CHEK AVIVA PLUS) test strip TEST twice a day and lancets  2/day 100 each 12  . insulin lispro protamine-lispro (HUMALOG 50/50 MIX) (50-50) 100 UNIT/ML SUSP injection Inject 0.5 mLs (50 Units total) into the skin 2 (two) times daily before a meal. 40 mL 2  . Insulin Pen Needle 29G X 12MM MISC Use as directed three times daily 100 each 3  . potassium chloride (K-DUR,KLOR-CON) 10 MEQ tablet take 1 tablet by mouth once daily 90 tablet 0  . telmisartan (MICARDIS) 40 MG tablet Take 1 tablet (40 mg total) by mouth daily. 90 tablet 2  . cephALEXin (KEFLEX) 500 MG capsule Take 1 capsule (500 mg total) by mouth 4 (four) times daily. (Patient not taking: Reported on 06/18/2015) 28 capsule 0  . Diclofenac Sodium 2 % SOLN Place 1 packet onto the skin 2 (two) times  daily. Reported on 06/18/2015    . doxycycline (VIBRA-TABS) 100 MG tablet Take 1 tablet (100 mg total) by mouth 2 (two) times daily. (Patient not taking: Reported on 06/18/2015) 15 tablet 0  . omeprazole (PRILOSEC) 20 MG capsule Take 1 capsule (20 mg total) by mouth daily. (Patient not taking: Reported on 05/25/2015) 30 capsule 3   No current facility-administered medications on file prior to visit.    No Known Allergies  Family History  Problem Relation Age of Onset  . Asthma Mother   . Stroke Mother   . Diabetes Other     M, B, S  . Hypertension Sister     M, S,B  . Pancreatic cancer Brother   . Colon cancer Neg Hx   . Prostate cancer Neg Hx   . Breast cancer Neg Hx     BP 132/86 mmHg  Pulse 75  Temp(Src) 98.2 F (36.8 C) (Oral)  Ht '5\' 9"'$  (1.753 m)  Wt 310 lb (140.615 kg)  BMI 45.76 kg/m2  SpO2 97%  Review of Systems She denies hypoglycemia    Objective:   Physical Exam VITAL SIGNS:  See vs page GENERAL: no distress Pulses: dorsalis pedis intact bilat.   MSK: no deformity of the feet.  CV: no leg edema.  Skin:  no ulcer on the feet.  normal color and temp on the feet. Neuro: sensation is intact to touch on the feet.  Ext: There is bilateral onychomycosis of the toenails  A1c=7.5%    Assessment & Plan:  DM: this is the best control this pt should aim for, given this regimen, which does match insulin to her changing needs throughout the day.   Patient is advised the following: Patient Instructions  Please make a follow-up appointment in 4 months.   On this type of insulin schedule, you should eat meals on a regular schedule.  If a meal is missed or significantly delayed, your blood sugar could go low. check your blood sugar 2 times a day.  vary the time of day when you check, between before the 3 meals, and at bedtime.  also check if you have symptoms of your blood sugar being too high or too low.  please keep a record of the readings and bring it to your next  appointment here.  please call us sooner if your blood sugar goes below 70, or if it stays over 200.    Please continue the insulin, 50 units twice a day (just before the first and last meals of the day).  However, on Sunday mornings, take just 10 units.

## 2015-06-18 NOTE — Patient Instructions (Addendum)
Please make a follow-up appointment in 4 months.   On this type of insulin schedule, you should eat meals on a regular schedule.  If a meal is missed or significantly delayed, your blood sugar could go low. check your blood sugar 2 times a day.  vary the time of day when you check, between before the 3 meals, and at bedtime.  also check if you have symptoms of your blood sugar being too high or too low.  please keep a record of the readings and bring it to your next appointment here.  please call us sooner if your blood sugar goes below 70, or if it stays over 200.    Please continue the insulin, 50 units twice a day (just before the first and last meals of the day).  However, on Sunday mornings, take just 10 units.

## 2015-07-11 DIAGNOSIS — R0902 Hypoxemia: Secondary | ICD-10-CM | POA: Diagnosis not present

## 2015-07-11 DIAGNOSIS — G4733 Obstructive sleep apnea (adult) (pediatric): Secondary | ICD-10-CM | POA: Diagnosis not present

## 2015-08-02 ENCOUNTER — Other Ambulatory Visit: Payer: Self-pay | Admitting: Endocrinology

## 2015-08-02 ENCOUNTER — Other Ambulatory Visit: Payer: Self-pay

## 2015-08-02 MED ORDER — DILTIAZEM HCL ER BEADS 360 MG PO CP24: 360.0000 mg | ORAL_CAPSULE | Freq: Every day | ORAL | Status: DC

## 2015-08-06 ENCOUNTER — Telehealth: Payer: Self-pay | Admitting: Internal Medicine

## 2015-08-06 MED ORDER — POTASSIUM CHLORIDE CRYS ER 10 MEQ PO TBCR: 10.0000 meq | EXTENDED_RELEASE_TABLET | Freq: Every day | ORAL | Status: DC

## 2015-08-06 MED ORDER — CARVEDILOL 25 MG PO TABS: 25.0000 mg | ORAL_TABLET | Freq: Two times a day (BID) | ORAL | Status: DC

## 2015-08-06 NOTE — Telephone Encounter (Signed)
Relation to [id number] Call back number: [phone] (H) Pharmacy: [facility], [place] [phone] (Phone) [phone] (Fax)         Reason for call:  Patient requesting a refill carvedilol (COREG) 25 MG tablet and potassium chloride (K-DUR,KLOR-CON) 10 MEQ tablet

## 2015-08-06 NOTE — Telephone Encounter (Signed)
Rxs sent

## 2015-08-11 DIAGNOSIS — R0902 Hypoxemia: Secondary | ICD-10-CM | POA: Diagnosis not present

## 2015-08-11 DIAGNOSIS — G4733 Obstructive sleep apnea (adult) (pediatric): Secondary | ICD-10-CM | POA: Diagnosis not present

## 2015-09-05 DIAGNOSIS — L602 Onychogryphosis: Secondary | ICD-10-CM | POA: Diagnosis not present

## 2015-09-05 DIAGNOSIS — E1351 Other specified diabetes mellitus with diabetic peripheral angiopathy without gangrene: Secondary | ICD-10-CM | POA: Diagnosis not present

## 2015-09-05 DIAGNOSIS — L84 Corns and callosities: Secondary | ICD-10-CM | POA: Diagnosis not present

## 2015-09-07 ENCOUNTER — Other Ambulatory Visit: Payer: Self-pay

## 2015-09-07 DIAGNOSIS — Z1231 Encounter for screening mammogram for malignant neoplasm of breast: Secondary | ICD-10-CM

## 2015-09-10 DIAGNOSIS — R0902 Hypoxemia: Secondary | ICD-10-CM | POA: Diagnosis not present

## 2015-09-10 DIAGNOSIS — G4733 Obstructive sleep apnea (adult) (pediatric): Secondary | ICD-10-CM | POA: Diagnosis not present

## 2015-09-19 ENCOUNTER — Ambulatory Visit (INDEPENDENT_AMBULATORY_CARE_PROVIDER_SITE_OTHER): Payer: Medicare Other | Admitting: Internal Medicine

## 2015-09-19 ENCOUNTER — Encounter: Payer: Self-pay | Admitting: Internal Medicine

## 2015-09-19 VITALS — BP 132/78 | HR 73 | Temp 98.1°F | Ht 69.0 in | Wt 306.4 lb

## 2015-09-19 DIAGNOSIS — I10 Essential (primary) hypertension: Secondary | ICD-10-CM

## 2015-09-19 DIAGNOSIS — Z09 Encounter for follow-up examination after completed treatment for conditions other than malignant neoplasm: Secondary | ICD-10-CM

## 2015-09-19 DIAGNOSIS — I739 Peripheral vascular disease, unspecified: Secondary | ICD-10-CM | POA: Diagnosis not present

## 2015-09-19 LAB — BASIC METABOLIC PANEL
BUN: 32 mg/dL — AB (ref 6–23)
CALCIUM: 9.8 mg/dL (ref 8.4–10.5)
CO2: 26 mEq/L (ref 19–32)
Chloride: 104 mEq/L (ref 96–112)
Creatinine, Ser: 1.15 mg/dL (ref 0.40–1.20)
GFR: 60.63 mL/min (ref >60.00)
GLUCOSE: 110 mg/dL — AB (ref 70–99)
POTASSIUM: 3.9 meq/L (ref 3.5–5.1)
Sodium: 139 mEq/L (ref 135–145)

## 2015-09-19 NOTE — Progress Notes (Signed)
Subjective:    Patient ID: [name], female    DOB: [birth year], 65 y.o.   MRN: [id number]  DOS:  09/19/2015 Type of visit - description : Routine office visit Interval history: HTN: Self discontinue carvedilol "a while back" because caused "abd  cramps ". Ambulatory BPs reportedly okay. Had a incision and drainage for a  groin abscess, reports is completely healed Her main concern today is pain. Symptoms started approximately March 2017, has pain that goes from the ankles up to the thoracic back, travels by the lateral aspect of the legs,bilateral,  worse when she starts walking, decrease after she sits down. Also, same pain when she lays down in bed, self resolves in few minutes   Review of Systems  denies lower extremity edema No lower extremity paresthesias Has chronic back pain(In addition to the above described pain) Has occasional chest pain, at baseline, not different from previous pains.   Past Medical History  Diagnosis Date  . HYPERLIPIDEMIA 01/11/2009  . DEPRESSION 09/26/2008  . HYPERTENSION 10/06/2006  . DEGENERATIVE JOINT DISEASE 10/06/2006  . Pain in joint, multiple sites 11/10/2006  . INSOMNIA 09/26/2008  . CHEST PAIN 11/18/2007  . Diverticulosis     2130,[id number]  . Internal hemorrhoids   . OBSTRUCTIVE SLEEP APNEA 06/23/2008    Severe OSA per sleep study 2010, Rx a CPAP  . DIABETES MELLITUS 10/06/2006  . UNSPECIFIED ANEMIA 12/10/2009  . GERD (gastroesophageal reflux disease) 07/25/2013  . Bronchospasm 05/28/2012    Past Surgical History  Procedure Laterality Date  . Right knee replacement  2005  . 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 Colonoscopy  08/01/2011    Procedure: COLONOSCOPY;  Surgeon: Inda Castle, MD;  Location: [facility];  Service: Endoscopy;  Laterality: N/A;    Social History   Social History  . Marital Status: Widowed    Spouse Name: N/A  . Number of Children: 4  . Years of Education: N/A   Occupational History  . disability    Social  History Main Topics  . Smoking status: Former Smoker -- 0.20 packs/day for 4 years    Types: Cigarettes    Quit date: 05/13/1995  . Smokeless tobacco: Never Used  . Alcohol Use: Yes     Comment: Rarely  . Drug Use: No  . Sexual Activity: Not on file   Other Topics Concern  . Not on file   Social History Narrative   Widow , lives by herself        Medication List       This list is accurate as of: 09/19/15  6:10 PM.  Always use your most recent med list.               ACCU-CHEK AVIVA PLUS w/Device Kit  1 Device by Does not apply route once.     ACCU-CHEK SOFTCLIX LANCETS lancets  Use to check blood sugar 2 times per day. Dx Code: E11.9     albuterol 108 (90 Base) MCG/ACT inhaler  Commonly known as:  PROVENTIL HFA;VENTOLIN HFA  Inhale 2 puffs into the lungs every 4 (four) hours as needed for wheezing.     aspirin 325 MG tablet  Take 325 mg by mouth daily.     budesonide-formoterol 160-4.5 MCG/ACT inhaler  Commonly known as:  SYMBICORT  Inhale 2 puffs into the lungs 2 (two) times daily.     CRESTOR 40 MG tablet  Generic drug:  rosuvastatin  take 1 tablet by mouth once  daily     Diclofenac Sodium 2 % Soln  Place 1 packet onto the skin 2 (two) times daily. Reported on 06/18/2015     diltiazem 360 MG 24 hr capsule  Commonly known as:  TAZTIA XT  Take 1 capsule (360 mg total) by mouth daily.     glucose blood test strip  Commonly known as:  ACCU-CHEK AVIVA PLUS  TEST twice a day and lancets 2/day     HUMALOG MIX 50/50 KWIKPEN (50-50) 100 UNIT/ML Kwikpen  Generic drug:  Insulin Lispro Prot & Lispro  inject 50 units INTO THE SKIN TWICE A DAY BEFORE MEAL     Insulin Pen Needle 29G X 12MM Misc  Use as directed three times daily     omeprazole 20 MG capsule  Commonly known as:  PRILOSEC  Take 1 capsule (20 mg total) by mouth daily.     potassium chloride 10 MEQ tablet  Commonly known as:  K-DUR,KLOR-CON  Take 1 tablet (10 mEq total) by mouth daily.      telmisartan 40 MG tablet  Commonly known as:  MICARDIS  Take 1 tablet (40 mg total) by mouth daily.           Objective:   Physical Exam BP 132/78 mmHg  Pulse 73  Temp(Src) 98.1 F (36.7 C) (Oral)  Ht '5\' 9"'$  (1.753 m)  Wt 306 lb 6 oz (138.971 kg)  BMI 45.22 kg/m2  SpO2 97% General:   Well developed, well nourished . NAD.  HEENT:  Normocephalic . Face symmetric, atraumatic Lungs:  CTA B Normal respiratory effort, no intercostal retractions, no accessory muscle use. Heart: RRR,  no murmur.  Normal pedal pulses bilaterally Femoral pulses hard to find, likely d/t abundant fat tissue  DIABETIC FEET EXAM: No lower extremity edema Skin normal, nails normal, no calluses Pinprick examination of the feet normal.  MSK: Slightly TTP on the lower back, no TTP at the trochanteric bursas, range of motion of the hips mildly decrease, straight leg test negative but it did cause some discomfort at the popliteal areas. Skin: Not pale. Not jaundice Neurologic:  alert & oriented X3.  Speech normal, gait appropriate for age and unassisted DTRs: Absent in the knees, decreased at the ankles. Motor strength symmetric Psych--  Cognition and judgment appear intact.  Cooperative with normal attention span and concentration.  Behavior appropriate. No anxious or depressed appearing.      Assessment & Plan:   Assessment> DM per endocrinology HTN Hyperlipidemia Depression, insomnia (citalopram caused headache ? 05-2014) Asthma MSK: --DJD, saw ortho ~06-2014 --Multiple joint pains, 2008, sedimentation rate, RA >> negative --Back pain>> -- MRI 2007: Epidural lipomatosis, congenitally short pedicles, spondylosis, and disc bulges and protrusions contribute to central, foraminal, and subarticular lateral recess stenoses as detailed above.  --2008, saw Ortho, Rx local injections  GERD Morbid obesity OSA dx 2010, severe, nocturnal desaturation, intolerant to CPAP, on nocturnal O2 DOE, chronic  CP, chronic RUQ pain:: 2004--Cath neg, areterial LE dopplers (-) 06-2003-- abd. u/s fatty liver 01-2005-- CP...neg u/s GB, neg HIDA 10-06--saw cards--cardiolite neg 12-2007 abnormal stress test Cath 02-01-08: normal coronaries   Plan DM: Per endocrinology, diabetes foot exam negative today HTN: self dc  carvedilol, BPs remain wnl, check a BMP  Lower extremity pain, back pain: Sx suggest claudication, given normal pedal pulses suspect is probably neurological (see MRI report from 2007). Plan: Lower extremity Dopplers, refer to Ortho.   RTC 4 months, CPX

## 2015-09-19 NOTE — Assessment & Plan Note (Signed)
DM: Per endocrinology, diabetes foot exam negative today HTN: self dc  carvedilol, BPs remain wnl, check a BMP  Lower extremity pain, back pain: Sx suggest claudication, given normal pedal pulses suspect is probably neurological (see MRI report from 2007). Plan: Lower extremity Dopplers, refer to Ortho.   RTC 4 months, CPX

## 2015-09-19 NOTE — Progress Notes (Signed)
Pre visit review using our clinic review tool, if applicable. No additional management support is needed unless otherwise documented below in the visit note. 

## 2015-09-19 NOTE — Addendum Note (Signed)
Addended by: Kathlene November E on: 09/19/2015 06:15 PM   Modules accepted: Orders

## 2015-09-19 NOTE — Patient Instructions (Signed)
GO TO THE LAB : Get the blood work     GO TO THE FRONT DESK Schedule your next appointment for a complete physical exam in 4 months, fasting.

## 2015-09-25 ENCOUNTER — Ambulatory Visit (HOSPITAL_COMMUNITY)
Admission: RE | Admit: 2015-09-25 | Discharge: 2015-09-25 | Disposition: A | Discharge status: Home / Self Care | Payer: Medicare Other | Source: Ambulatory Visit | Attending: Internal Medicine | Admitting: Internal Medicine

## 2015-09-25 DIAGNOSIS — E119 Type 2 diabetes mellitus without complications: Secondary | ICD-10-CM | POA: Diagnosis not present

## 2015-09-25 DIAGNOSIS — I739 Peripheral vascular disease, unspecified: Secondary | ICD-10-CM | POA: Diagnosis not present

## 2015-09-25 NOTE — Progress Notes (Signed)
VASCULAR LAB PRELIMINARY  ARTERIAL  ABI completed:    RIGHT    LEFT    PRESSURE WAVEFORM  PRESSURE WAVEFORM  BRACHIAL 112 Triphasic  BRACHIAL 90 Triphasic   DP 86 Triphasic  DP 84 Triphasic   AT   AT    PT 85 Triphasic  PT 83 Triphasic   PER   PER    GREAT TOE 71 dampened GREAT TOE 83 wnl    RIGHT LEFT  ABI 0.77 0.75  TBI 0.63 0.74     Alpa Salvo, RVT 09/25/2015, 12:57 PM

## 2015-09-27 ENCOUNTER — Telehealth: Payer: Self-pay | Admitting: Internal Medicine

## 2015-09-27 NOTE — Telephone Encounter (Signed)
noted 

## 2015-09-27 NOTE — Telephone Encounter (Signed)
Caller name: Tracie Harrier, NP (with [facility]) Can be reached: [phone]   Reason for call: Pt has reported ongoing chest pains 2-3 years on both left and right side. She stated they last for a couple minutes and go away. Pt was previously seeing cardiology, Dr. Stanford Breed. Nidhi called Dr. Jacalyn Lefevre office and was told pt hasn't been seen in more than 7 years. They would have to see her as a new pt and appt would likely be in July. Pt advised her she was here recently and talked to Dr. Larose Kells about this and was not sure if being referred to cardiology. I don't see a referral in system. Please f/u with the patient to notify of follow up recommended by Dr. Larose Kells.

## 2015-09-27 NOTE — Telephone Encounter (Addendum)
Pt has chronic chest pain.  States she has been experiencing this chest pain for years.  All of her doctors are aware of it.  She says that she typically experiences these chest pain when she feels depressed, under a lot of stress, or stands up for long periods of time or walks for long distances.  Chest pain experienced is described as sharp, intermittent located on both the left and right side of her chest that radiates to her shoulders and lasting 5 mins or less.  Pt thinks current chest pain is due to her being depressed over losing her son back in September of last year and his birthday is next month.  Pt was advised to go to the ER.  She declined.  Several appts was offered.  She declined stating she does not have transportation to get here.  Pt was strongly advised to be seen.  Pt states she will see if she can get a way here tomorrow.  She says she will call in the morning if she's able to come in.    Awaiting call back.

## 2015-10-01 ENCOUNTER — Telehealth: Payer: Self-pay | Admitting: Internal Medicine

## 2015-10-01 ENCOUNTER — Ambulatory Visit
Admission: RE | Admit: 2015-10-01 | Discharge: 2015-10-01 | Disposition: A | Discharge status: Home / Self Care | Payer: Medicare Other | Source: Ambulatory Visit

## 2015-10-01 DIAGNOSIS — Z1231 Encounter for screening mammogram for malignant neoplasm of breast: Secondary | ICD-10-CM | POA: Diagnosis not present

## 2015-10-01 NOTE — Telephone Encounter (Signed)
Pt called in to schedule an appt for chest pain. Scheduled pt an appt. Pt declined speaking with [facility] because she says that she has mentioned this concern to her providers multiple times.

## 2015-10-01 NOTE — Telephone Encounter (Signed)
Appt scheduled

## 2015-10-01 NOTE — Telephone Encounter (Signed)
See 09/27/15 phone note.

## 2015-10-01 NOTE — Telephone Encounter (Signed)
Pt has an appt scheduled---10/10/15 @ 10:30 am with Mackie Pai, PA-C.

## 2015-10-10 ENCOUNTER — Emergency Department (HOSPITAL_BASED_OUTPATIENT_CLINIC_OR_DEPARTMENT_OTHER)
Admission: EM | Admit: 2015-10-10 | Discharge: 2015-10-10 | Disposition: A | Discharge status: Home / Self Care | Payer: Medicare Other | Attending: Emergency Medicine | Admitting: Emergency Medicine

## 2015-10-10 ENCOUNTER — Encounter (HOSPITAL_BASED_OUTPATIENT_CLINIC_OR_DEPARTMENT_OTHER): Payer: Self-pay | Admitting: Emergency Medicine

## 2015-10-10 ENCOUNTER — Emergency Department (HOSPITAL_BASED_OUTPATIENT_CLINIC_OR_DEPARTMENT_OTHER): Payer: Medicare Other

## 2015-10-10 ENCOUNTER — Ambulatory Visit (INDEPENDENT_AMBULATORY_CARE_PROVIDER_SITE_OTHER): Payer: Medicare Other | Admitting: Medical

## 2015-10-10 ENCOUNTER — Encounter: Payer: Self-pay | Admitting: Medical

## 2015-10-10 VITALS — BP 126/78 | HR 80 | Temp 97.8°F | Ht 69.0 in | Wt 306.4 lb

## 2015-10-10 DIAGNOSIS — Z7982 Long term (current) use of aspirin: Secondary | ICD-10-CM | POA: Diagnosis not present

## 2015-10-10 DIAGNOSIS — R079 Chest pain, unspecified: Secondary | ICD-10-CM

## 2015-10-10 DIAGNOSIS — R0602 Shortness of breath: Secondary | ICD-10-CM

## 2015-10-10 DIAGNOSIS — Z87891 Personal history of nicotine dependence: Secondary | ICD-10-CM | POA: Insufficient documentation

## 2015-10-10 DIAGNOSIS — R0781 Pleurodynia: Secondary | ICD-10-CM | POA: Diagnosis not present

## 2015-10-10 DIAGNOSIS — Z794 Long term (current) use of insulin: Secondary | ICD-10-CM | POA: Diagnosis not present

## 2015-10-10 DIAGNOSIS — I10 Essential (primary) hypertension: Secondary | ICD-10-CM | POA: Diagnosis not present

## 2015-10-10 DIAGNOSIS — E119 Type 2 diabetes mellitus without complications: Secondary | ICD-10-CM | POA: Diagnosis not present

## 2015-10-10 DIAGNOSIS — Z79899 Other long term (current) drug therapy: Secondary | ICD-10-CM | POA: Diagnosis not present

## 2015-10-10 DIAGNOSIS — M79621 Pain in right upper arm: Secondary | ICD-10-CM

## 2015-10-10 DIAGNOSIS — E785 Hyperlipidemia, unspecified: Secondary | ICD-10-CM | POA: Diagnosis not present

## 2015-10-10 DIAGNOSIS — F329 Major depressive disorder, single episode, unspecified: Secondary | ICD-10-CM | POA: Insufficient documentation

## 2015-10-10 DIAGNOSIS — M25521 Pain in right elbow: Secondary | ICD-10-CM

## 2015-10-10 DIAGNOSIS — R0789 Other chest pain: Secondary | ICD-10-CM | POA: Diagnosis not present

## 2015-10-10 LAB — BASIC METABOLIC PANEL
Anion gap: 9 (ref 5–15)
BUN: 22 mg/dL — ABNORMAL HIGH (ref 6–20)
CO2: 24 mmol/L (ref 22–32)
Calcium: 9.3 mg/dL (ref 8.9–10.3)
Chloride: 105 mmol/L (ref 101–111)
Creatinine, Ser: 1.16 mg/dL — ABNORMAL HIGH (ref 0.44–1.00)
GFR calc Af Amer: 56 mL/min — ABNORMAL LOW (ref >60)
GFR calc non Af Amer: 48 mL/min — ABNORMAL LOW (ref >60)
Glucose, Bld: 74 mg/dL (ref 65–99)
Potassium: 3.6 mmol/L (ref 3.5–5.1)
Sodium: 138 mmol/L (ref 135–145)

## 2015-10-10 LAB — TROPONIN I: Troponin I: <0.03 ng/mL (ref <0.031)

## 2015-10-10 LAB — CBC
HCT: 36 % (ref 36.0–46.0)
Hemoglobin: 12.7 g/dL (ref 12.0–15.0)
MCH: 29.3 pg (ref 26.0–34.0)
MCHC: 35.3 g/dL (ref 30.0–36.0)
MCV: 83.1 fL (ref 78.0–100.0)
Platelets: 281 10*3/uL (ref 150–400)
RBC: 4.33 MIL/uL (ref 3.87–5.11)
RDW: 15.4 % (ref 11.5–15.5)
WBC: 4.6 10*3/uL (ref 4.0–10.5)

## 2015-10-10 NOTE — Progress Notes (Signed)
Pre visit review using our clinic review tool, if applicable. No additional management support is needed unless otherwise documented below in the visit note. 

## 2015-10-10 NOTE — Progress Notes (Signed)
Subjective:    Patient ID: [name], female    DOB: 11/09/1950, 65 y.o.   MRN: [id number]  HPI  Pt in for chest pain. She states this has been going on for years. She states off and on for 3 years.  Pt states she had some pain this morning. Pt states this morning pain 8-9 level pain. Pt rt arm and rt side upper thorax started to hurt. When she breathes has some rt side chest and back pain.  Pt has some rt arm pain as well. She states this has been going on for a while. Pt points that ealier in the day when she had pain was in lower mid chest. Points to xyphoid area.  Pt states recently at night she has been feeling some shortness of breath.   Some rt shoulder with  rt arm arm pain   Review of Systems  Constitutional: Negative for chills and fatigue.  Respiratory: Positive for shortness of breath. Negative for cough, chest tightness and wheezing.   Cardiovascular: Positive for chest pain. Negative for palpitations.  Gastrointestinal: Negative for abdominal pain.  Musculoskeletal:       Rt arm/shoulder pain  Neurological: Negative for dizziness and headaches.  Hematological: Negative for adenopathy. Does not bruise/bleed easily.   Past Medical History  Diagnosis Date  . HYPERLIPIDEMIA 01/11/2009  . DEPRESSION 09/26/2008  . HYPERTENSION 10/06/2006  . DEGENERATIVE JOINT DISEASE 10/06/2006  . Pain in joint, multiple sites 11/10/2006  . INSOMNIA 09/26/2008  . CHEST PAIN 11/18/2007  . Diverticulosis     [id number]  . Internal hemorrhoids   . OBSTRUCTIVE SLEEP APNEA 06/23/2008    Severe OSA per sleep study 2010, Rx a CPAP  . DIABETES MELLITUS 10/06/2006  . UNSPECIFIED ANEMIA 12/10/2009  . GERD (gastroesophageal reflux disease) 07/25/2013  . Bronchospasm 05/28/2012     Social History   Social History  . Marital Status: Widowed    Spouse Name: N/A  . Number of Children: 4  . Years of Education: N/A   Occupational History  . disability    Social History Main Topics  . Smoking  status: Former Smoker -- 0.20 packs/day for 4 years    Types: Cigarettes    Quit date: 05/13/1995  . Smokeless tobacco: Never Used  . Alcohol Use: Yes     Comment: Rarely  . Drug Use: No  . Sexual Activity: Not on file   Other Topics Concern  . Not on file   Social History Narrative   Widow , lives by herself    Past Surgical History  Procedure Laterality Date  . Right knee replacement  2005  . 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 Colonoscopy  08/01/2011    Procedure: COLONOSCOPY;  Surgeon: Inda Castle, MD;  Location: [facility];  Service: Endoscopy;  Laterality: N/A;    Family History  Problem Relation Age of Onset  . Asthma Mother   . Stroke Mother   . Diabetes Other     M, B, S  . Hypertension Sister     M, S,B  . Pancreatic cancer Brother   . Colon cancer Neg Hx   . Prostate cancer Neg Hx   . Breast cancer Neg Hx     No Known Allergies  Current Outpatient Prescriptions on File Prior to Visit  Medication Sig Dispense Refill  . ACCU-CHEK SOFTCLIX LANCETS lancets Use to check blood sugar 2 times per day. Dx Code: E11.9 100 each 12  . albuterol (PROVENTIL  HFA;VENTOLIN HFA) 108 (90 BASE) MCG/ACT inhaler Inhale 2 puffs into the lungs every 4 (four) hours as needed for wheezing. 1 Inhaler 6  . aspirin 325 MG tablet Take 325 mg by mouth daily.      . Blood Glucose Monitoring Suppl (ACCU-CHEK AVIVA PLUS) W/DEVICE KIT 1 Device by Does not apply route once. 1 kit 0  . budesonide-formoterol (SYMBICORT) 160-4.5 MCG/ACT inhaler Inhale 2 puffs into the lungs 2 (two) times daily. 10.2 g 5  . CRESTOR 40 MG tablet take 1 tablet by mouth once daily 90 tablet 1  . Diclofenac Sodium 2 % SOLN Place 1 packet onto the skin 2 (two) times daily. Reported on 06/18/2015    . diltiazem (TAZTIA XT) 360 MG 24 hr capsule Take 1 capsule (360 mg total) by mouth daily. 90 capsule 1  . glucose blood (ACCU-CHEK AVIVA PLUS) test strip TEST twice a day and lancets 2/day 100 each 12  . HUMALOG MIX  50/50 KWIKPEN (50-50) 100 UNIT/ML Kwikpen inject 50 units INTO THE SKIN TWICE A DAY BEFORE MEAL 30 mL 2  . Insulin Pen Needle 29G X 12MM MISC Use as directed three times daily 100 each 3  . omeprazole (PRILOSEC) 20 MG capsule Take 1 capsule (20 mg total) by mouth daily. 30 capsule 3  . potassium chloride (K-DUR,KLOR-CON) 10 MEQ tablet Take 1 tablet (10 mEq total) by mouth daily. 90 tablet 1  . telmisartan (MICARDIS) 40 MG tablet Take 1 tablet (40 mg total) by mouth daily. 90 tablet 2   No current facility-administered medications on file prior to visit.    BP 126/78 mmHg  Pulse 80  Temp(Src) 97.8 F (36.6 C) (Oral)  Ht _0  (1.753 m)  Wt 306 lb 6.4 oz (138.982 kg)  BMI 45.23 kg/m2  SpO2 98%       Objective:   Physical Exam  General- No acute distress. Pleasant patient. Neck- Full range of motion, no jvd Lungs- Clear, even and unlabored. Heart- regular rate and rhythm. Neurologic- CNII- XII grossly intact.  Rt arm- appears to have some prominent dilated vessels in rt arm.       Assessment & Plan:   ekg no acutes changes. Same compared to 2014 ekg.  For your chest pain, pleuritic pain, shortness or breath and arm pain you need to be evaluated in the emergency department.  I did talk with ED physician working today.  Follow up post ED evaluation as they determine.  Nafisah Runions, Percell Miller, PA-C

## 2015-10-10 NOTE — Discharge Instructions (Signed)
Nonspecific Chest Pain  °Chest pain can be caused by many different conditions. There is always a chance that your pain could be related to something serious, such as a heart attack or a blood clot in your lungs. Chest pain can also be caused by conditions that are not life-threatening. If you have chest pain, it is very important to follow up with your health care provider. °CAUSES  °Chest pain can be caused by: °· Heartburn. °· Pneumonia or bronchitis. °· Anxiety or stress. °· Inflammation around your heart (pericarditis) or lung (pleuritis or pleurisy). °· A blood clot in your lung. °· A collapsed lung (pneumothorax). It can develop suddenly on its own (spontaneous pneumothorax) or from trauma to the chest. °· Shingles infection (varicella-zoster virus). °· Heart attack. °· Damage to the bones, muscles, and cartilage that make up your chest wall. This can include: °¨ Bruised bones due to injury. °¨ Strained muscles or cartilage due to frequent or repeated coughing or overwork. °¨ Fracture to one or more ribs. °¨ Sore cartilage due to inflammation (costochondritis). °RISK FACTORS  °Risk factors for chest pain may include: °· Activities that increase your risk for trauma or injury to your chest. °· Respiratory infections or conditions that cause frequent coughing. °· Medical conditions or overeating that can cause heartburn. °· Heart disease or family history of heart disease. °· Conditions or health behaviors that increase your risk of developing a blood clot. °· Having had chicken pox (varicella zoster). °SIGNS AND SYMPTOMS °Chest pain can feel like: °· Burning or tingling on the surface of your chest or deep in your chest. °· Crushing, pressure, aching, or squeezing pain. °· Dull or sharp pain that is worse when you move, cough, or take a deep breath. °· Pain that is also felt in your back, neck, shoulder, or arm, or pain that spreads to any of these areas. °Your chest pain may come and go, or it may stay  constant. °DIAGNOSIS °Lab tests or other studies may be needed to find the cause of your pain. Your health care provider may have you take a test called an ambulatory ECG (electrocardiogram). An ECG records your heartbeat patterns at the time the test is performed. You may also have other tests, such as: °· Transthoracic echocardiogram (TTE). During echocardiography, sound waves are used to create a picture of all of the heart structures and to look at how blood flows through your heart. °· Transesophageal echocardiogram (TEE). This is a more advanced imaging test that obtains images from inside your body. It allows your health care provider to see your heart in finer detail. °· Cardiac monitoring. This allows your health care provider to monitor your heart rate and rhythm in real time. °· Holter monitor. This is a portable device that records your heartbeat and can help to diagnose abnormal heartbeats. It allows your health care provider to track your heart activity for several days, if needed. °· Stress tests. These can be done through exercise or by taking medicine that makes your heart beat more quickly. °· Blood tests. °· Imaging tests. °TREATMENT  °Your treatment depends on what is causing your chest pain. Treatment may include: °· Medicines. These may include: °¨ Acid blockers for heartburn. °¨ Anti-inflammatory medicine. °¨ Pain medicine for inflammatory conditions. °¨ Antibiotic medicine, if an infection is present. °¨ Medicines to dissolve blood clots. °¨ Medicines to treat coronary artery disease. °· Supportive care for conditions that do not require medicines. This may include: °¨ Resting. °¨ Applying heat   or cold packs to injured areas. °¨ Limiting activities until pain decreases. °HOME CARE INSTRUCTIONS °· If you were prescribed an antibiotic medicine, finish it all even if you start to feel better. °· Avoid any activities that bring on chest pain. °· Do not use any tobacco products, including  cigarettes, chewing tobacco, or electronic cigarettes. If you need help quitting, ask your health care provider. °· Do not drink alcohol. °· Take medicines only as directed by your health care provider. °· Keep all follow-up visits as directed by your health care provider. This is important. This includes any further testing if your chest pain does not go away. °· If heartburn is the cause for your chest pain, you may be told to keep your head raised (elevated) while sleeping. This reduces the chance that acid will go from your stomach into your esophagus. °· Make lifestyle changes as directed by your health care provider. These may include: °¨ Getting regular exercise. Ask your health care provider to suggest some activities that are safe for you. °¨ Eating a heart-healthy diet. A registered dietitian can help you to learn healthy eating options. °¨ Maintaining a healthy weight. °¨ Managing diabetes, if necessary. °¨ Reducing stress. °SEEK MEDICAL CARE IF: °· Your chest pain does not go away after treatment. °· You have a rash with blisters on your chest. °· You have a fever. °SEEK IMMEDIATE MEDICAL CARE IF:  °· Your chest pain is worse. °· You have an increasing cough, or you cough up blood. °· You have severe abdominal pain. °· You have severe weakness. °· You faint. °· You have chills. °· You have sudden, unexplained chest discomfort. °· You have sudden, unexplained discomfort in your arms, back, neck, or jaw. °· You have shortness of breath at any time. °· You suddenly start to sweat, or your skin gets clammy. °· You feel nauseous or you vomit. °· You suddenly feel light-headed or dizzy. °· Your heart begins to beat quickly, or it feels like it is skipping beats. °These symptoms may represent a serious problem that is an emergency. Do not wait to see if the symptoms will go away. Get medical help right away. Call your local emergency services (911 in the [place] Do not drive yourself to the hospital. °  °This  information is not intended to replace advice given to you by your health care provider. Make sure you discuss any questions you have with your health care provider. °  °Document Released: 02/05/2005 Document Revised: 05/19/2014 Document Reviewed: 12/02/2013 °Elsevier Interactive Patient Education ©2016 [employer] ° °

## 2015-10-10 NOTE — Patient Instructions (Addendum)
For your chest pain, pleuritic pain, shortness or breath and arm pain you need to be evaluated in the emergency department.  I did talk with ED physican working today.   Follow up post ED evaluation as they determine.

## 2015-10-10 NOTE — ED Notes (Signed)
Patient transported to X-ray 

## 2015-10-10 NOTE — ED Notes (Signed)
Pt states she was seen by Dr. Oletta Lamas (upstairs) and sent here for evaluation of recurrent CP (for several years).

## 2015-10-10 NOTE — ED Notes (Signed)
Pt having intermittent chest pain under right breast for three years.  Pain radiates to back.  Pt states she has sob intermittently.  Pt was sent down from [place] office:  Mackie Pai PA for further evaluation.

## 2015-10-11 DIAGNOSIS — R0902 Hypoxemia: Secondary | ICD-10-CM | POA: Diagnosis not present

## 2015-10-11 DIAGNOSIS — G4733 Obstructive sleep apnea (adult) (pediatric): Secondary | ICD-10-CM | POA: Diagnosis not present

## 2015-10-16 ENCOUNTER — Encounter: Payer: Self-pay | Admitting: Endocrinology

## 2015-10-16 ENCOUNTER — Ambulatory Visit (INDEPENDENT_AMBULATORY_CARE_PROVIDER_SITE_OTHER): Payer: Medicare Other | Admitting: Endocrinology

## 2015-10-16 VITALS — BP 140/84 | HR 82 | Temp 98.2°F | Ht 69.0 in | Wt 305.0 lb

## 2015-10-16 DIAGNOSIS — Z794 Long term (current) use of insulin: Secondary | ICD-10-CM

## 2015-10-16 DIAGNOSIS — E119 Type 2 diabetes mellitus without complications: Secondary | ICD-10-CM | POA: Diagnosis not present

## 2015-10-16 LAB — MICROALBUMIN / CREATININE URINE RATIO
CREATININE, U: 217.6 mg/dL
MICROALB UR: 7 mg/dL — AB (ref 0.0–1.9)
Microalb Creat Ratio: 3.2 mg/g (ref 0.0–30.0)

## 2015-10-16 LAB — POCT GLYCOSYLATED HEMOGLOBIN (HGB A1C): Hemoglobin A1C: 6.7

## 2015-10-16 MED ORDER — INSULIN LISPRO PROT & LISPRO (50-50 MIX) 100 UNIT/ML KWIKPEN: 45.0000 [IU] | PEN_INJECTOR | Freq: Two times a day (BID) | SUBCUTANEOUS | Status: DC

## 2015-10-16 NOTE — Patient Instructions (Addendum)
Please make a follow-up appointment in 4 months.   On this type of insulin schedule, you should eat meals on a regular schedule.  If a meal is missed or significantly delayed, your blood sugar could go low. check your blood sugar 2 times a day.  vary the time of day when you check, between before the 3 meals, and at bedtime.  also check if you have symptoms of your blood sugar being too high or too low.  please keep a record of the readings and bring it to your next appointment here.  please call us sooner if your blood sugar goes below 70, or if it stays over 200.    Please reduce the insulin to 45 units twice a day (just before the first and last meals of the day).  However, on Sunday mornings, take just 10 units.

## 2015-10-16 NOTE — Progress Notes (Signed)
Subjective:    Patient ID: [name], female    DOB: 1950-09-13, 65 y.o.   MRN: [id number]  HPI Pt returns for f/u of diabetes mellitus: DM type: Insulin-requiring type 2 Dx'ed: 8307 Complications: polyneuropathy Therapy: insulin since 2004 GDM: never DKA: never Severe hypoglycemia: never.  Pancreatitis: never.   Other: she declines weight-loss surgery; she did not achieve good glycemic control with multiple daily injections, but she has done better with a BID insulin regimen.  She has takes less insulin on _0 08,2005  . Internal hemorrhoids   . OBSTRUCTIVE SLEEP APNEA 06/23/2008    Severe OSA per sleep study 2010, Rx a CPAP  . DIABETES MELLITUS 10/06/2006  . UNSPECIFIED ANEMIA 12/10/2009  . GERD (gastroesophageal reflux disease) 07/25/2013  . Bronchospasm 05/28/2012    Past Surgical History  Procedure Laterality Date  . Right knee replacement  2005  . 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 Colonoscopy  08/01/2011    Procedure: COLONOSCOPY;  Surgeon: Inda Castle, MD;  Location: [facility];  Service: Endoscopy;  Laterality: N/A;    Social History   Social History  . Marital Status: Widowed    Spouse Name: N/A  . Number of Children: 4  . Years of Education: N/A   Occupational History  . disability    Social History Main Topics  . Smoking status: Former Smoker -- 0.20 packs/day for 4 years    Types: Cigarettes    Quit date: 05/13/1995  . Smokeless  tobacco: Never Used  . Alcohol Use: Yes     Comment: Rarely  . Drug Use: No  . Sexual Activity: Not on file   Other Topics Concern  . Not on file   Social History Narrative   Widow , lives by herself    Current Outpatient Prescriptions on File Prior to Visit  Medication Sig Dispense Refill  . ACCU-CHEK SOFTCLIX LANCETS lancets Use to check blood sugar 2 times per day. Dx Code: E11.9 100 each 12  . albuterol (PROVENTIL HFA;VENTOLIN HFA) 108 (90 BASE) MCG/ACT inhaler Inhale 2 puffs into the lungs every 4 (four) hours as needed for wheezing. 1 Inhaler 6  . aspirin 325 MG tablet Take 325 mg by mouth daily.      . Blood Glucose Monitoring Suppl (ACCU-CHEK AVIVA PLUS) W/DEVICE KIT 1 Device by Does not apply route once. 1 kit 0  . budesonide-formoterol (SYMBICORT) 160-4.5 MCG/ACT inhaler Inhale 2 puffs into the lungs 2 (two) times daily. 10.2 g 5  . CRESTOR 40 MG tablet take 1 tablet by mouth once daily 90 tablet 1  . Diclofenac Sodium 2 % SOLN Place 1 packet onto the skin 2 (two) times daily. Reported on 06/18/2015    . diltiazem (TAZTIA XT) 360 MG 24 hr capsule Take 1 capsule (360 mg total) by mouth daily. 90 capsule 1  . glucose blood (ACCU-CHEK AVIVA PLUS) test strip TEST twice a day and  lancets 2/day 100 each 12  . Insulin Pen Needle 29G X 12MM MISC Use as directed three times daily 100 each 3  . omeprazole (PRILOSEC) 20 MG capsule Take 1 capsule (20 mg total) by mouth daily. 30 capsule 3  . potassium chloride (K-DUR,KLOR-CON) 10 MEQ tablet Take 1 tablet (10 mEq total) by mouth daily. 90 tablet 1  . telmisartan (MICARDIS) 40 MG tablet Take 1 tablet (40 mg total) by mouth daily. 90 tablet 2   No current facility-administered medications on file prior to visit.    No Known Allergies  Family History  Problem Relation Age of Onset  . Asthma Mother   . Stroke Mother   . Diabetes Other     M, B, S  . Hypertension Sister     M, S,B  . Pancreatic cancer Brother   . Colon cancer Neg Hx    . Prostate cancer Neg Hx   . Breast cancer Neg Hx     BP 140/84 mmHg  Pulse 82  Temp(Src) 98.2 F (36.8 C) (Oral)  Ht _0  (1.753 m)  Wt 305 lb (138.347 kg)  BMI 45.02 kg/m2  SpO2 93%  Review of Systems She denies hypoglycemia    Objective:   Physical Exam VITAL SIGNS:  See vs page GENERAL: no distress Pulses: dorsalis pedis intact bilat.  MSK: no deformity of the feet.  CV: no leg edema, but there are bilat vv's Skin: no ulcer on the feet. normal color and temp on the feet. Neuro: sensation is intact to touch on the feet.  Ext: There is bilateral onychomycosis of the toenails   A1c=6.7%    Assessment & Plan:  Insulin-requiring type 2 DM: overcontrolled, given this regimen, which does match insulin to her changing needs throughout the day  Patient is advised the following: Patient Instructions  Please make a follow-up appointment in 4 months.   On this type of insulin schedule, you should eat meals on a regular schedule.  If a meal is missed or significantly delayed, your blood sugar could go low. check your blood sugar 2 times a day.  vary the time of day when you check, between before the 3 meals, and at bedtime.  also check if you have symptoms of your blood sugar being too high or too low.  please keep a record of the readings and bring it to your next appointment here.  please call us sooner if your blood sugar goes below 70, or if it stays over 200.    Please reduce the insulin to 45 units twice a day (just before the first and last meals of the day).  However, on Sunday mornings, take just 10 units.

## 2015-10-19 ENCOUNTER — Other Ambulatory Visit: Payer: Self-pay | Admitting: Endocrinology

## 2015-10-23 NOTE — ED Provider Notes (Signed)
CSN: [id number]     Arrival date & time 10/10/15  1138 History   First MD Initiated Contact with Patient 10/10/15 1232     Chief Complaint  Patient presents with  . Chest Pain     (Consider location/radiation/quality/duration/timing/severity/associated sxs/prior Treatment) HPI   65 year old female with chest pain. Intermittent. Has been ongoing for the past 3 years. Pain is underneath her right breast. Radiates into her back. Seemingly comes and goes randomly. No appreciable exacerbating relieving factors. No cough. No shortness of breath. No unusual leg pain or swelling.  Past Medical History  Diagnosis Date  . HYPERLIPIDEMIA 01/11/2009  . DEPRESSION 09/26/2008  . HYPERTENSION 10/06/2006  . DEGENERATIVE JOINT DISEASE 10/06/2006  . Pain in joint, multiple sites 11/10/2006  . INSOMNIA 09/26/2008  . CHEST PAIN 11/18/2007  . Diverticulosis     [id number]  . Internal hemorrhoids   . OBSTRUCTIVE SLEEP APNEA 06/23/2008    Severe OSA per sleep study 2010, Rx a CPAP  . DIABETES MELLITUS 10/06/2006  . UNSPECIFIED ANEMIA 12/10/2009  . GERD (gastroesophageal reflux disease) 07/25/2013  . Bronchospasm 05/28/2012   Past Surgical History  Procedure Laterality Date  . Right knee replacement  2005  . 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 Colonoscopy  08/01/2011    Procedure: COLONOSCOPY;  Surgeon: Inda Castle, MD;  Location: [facility];  Service: Endoscopy;  Laterality: N/A;   Family History  Problem Relation Age of Onset  . Asthma Mother   . Stroke Mother   . Diabetes Other     M, B, S  . Hypertension Sister     M, S,B  . Pancreatic cancer Brother   . Colon cancer Neg Hx   . Prostate cancer Neg Hx   . Breast cancer Neg Hx    Social History  Substance Use Topics  . Smoking status: Former Smoker -- 0.20 packs/day for 4 years    Types: Cigarettes    Quit date: 05/13/1995  . Smokeless tobacco: Never Used  . Alcohol Use: Yes     Comment: Rarely   OB History    No data available      Review of Systems  All systems reviewed and negative, other than as noted in HPI.   Allergies  Review of patient's allergies indicates no known allergies.  Home Medications   Prior to Admission medications   Medication Sig Start Date End Date Taking? Authorizing Provider  ACCU-CHEK AVIVA PLUS test strip TEST TWICE A DAY AS DIRECTED 10/23/15   Renato Shin, MD  ACCU-CHEK SOFTCLIX LANCETS lancets Use to check blood sugar 2 times per day. Dx Code: E11.9 06/15/15   Renato Shin, MD  albuterol (PROVENTIL HFA;VENTOLIN HFA) 108 (90 BASE) MCG/ACT inhaler Inhale 2 puffs into the lungs every 4 (four) hours as needed for wheezing. 06/01/14 12/25/15  Colon Branch, MD  aspirin 325 MG tablet Take 325 mg by mouth daily.      Historical Provider, MD  BD ULTRA-FINE PEN NEEDLES 29G X 12.7MM MISC USE AS DIRECTED 3 TIMES A DAY 10/23/15   Renato Shin, MD  Blood Glucose Monitoring Suppl (ACCU-CHEK AVIVA PLUS) W/DEVICE KIT 1 Device by Does not apply route once. 06/20/11   Renato Shin, MD  budesonide-formoterol (SYMBICORT) 160-4.5 MCG/ACT inhaler Inhale 2 puffs into the lungs 2 (two) times daily. 09/01/14   Colon Branch, MD  CRESTOR 40 MG tablet take 1 tablet by mouth once daily 06/15/15   Renato Shin, MD  Diclofenac Sodium 2 % SOLN  Place 1 packet onto the skin 2 (two) times daily. Reported on 06/18/2015    Historical Provider, MD  diltiazem (TAZTIA XT) 360 MG 24 hr capsule Take 1 capsule (360 mg total) by mouth daily. 08/02/15   Colon Branch, MD  Insulin Lispro Prot & Lispro (HUMALOG MIX 50/50 KWIKPEN) (50-50) 100 UNIT/ML Kwikpen Inject 45 Units into the skin 2 (two) times daily with a meal. And pen needles 2/day 10/16/15   Renato Shin, MD  omeprazole (PRILOSEC) 20 MG capsule Take 1 capsule (20 mg total) by mouth daily. 05/28/12   Colon Branch, MD  potassium chloride (K-DUR,KLOR-CON) 10 MEQ tablet Take 1 tablet (10 mEq total) by mouth daily. 08/06/15   Colon Branch, MD  telmisartan (MICARDIS) 40 MG tablet Take 1 tablet (40 mg total)  by mouth daily. 03/14/15   Colon Branch, MD   BP 126/87 mmHg  Pulse 66  Temp(Src) 98.2 F (36.8 C) (Oral)  Resp 22  Ht '5\' 9"'$  (1.753 m)  Wt 306 lb (138.801 kg)  BMI 45.17 kg/m2  SpO2 95% Physical Exam  Constitutional: She appears well-developed and well-nourished. No distress.  HENT:  Head: Normocephalic and atraumatic.  Eyes: Conjunctivae are normal. Right eye exhibits no discharge. Left eye exhibits no discharge.  Neck: Neck supple.  Cardiovascular: Normal rate, regular rhythm and normal heart sounds.  Exam reveals no gallop and no friction rub.   No murmur heard. Pulmonary/Chest: Effort normal and breath sounds normal. No respiratory distress.  Abdominal: Soft. She exhibits no distension. There is no tenderness.  Musculoskeletal: She exhibits no edema or tenderness.  Neurological: She is alert.  Skin: Skin is warm and dry.  Psychiatric: She has a normal mood and affect. Her behavior is normal. Thought content normal.  Nursing note and vitals reviewed.   ED Course  Procedures (including critical care time) Labs Review Labs Reviewed  BASIC METABOLIC PANEL - Abnormal; Notable for the following:    BUN 22 (*)    Creatinine, Ser 1.16 (*)    GFR calc non Af Amer 48 (*)    GFR calc Af Amer 56 (*)    All other components within normal limits  CBC  TROPONIN I    Imaging Review No results found.   Dg Chest 2 View  10/10/2015  CLINICAL DATA:  Right-sided chest pain for 3 years. Intermittent shortness of breath EXAM: CHEST  2 VIEW COMPARISON:  May 04, 2012 FINDINGS: There is no edema or consolidation. The heart size and pulmonary vascularity are normal. No adenopathy. There is degenerative change in the thoracic spine and in each shoulder. IMPRESSION: No edema or consolidation. Electronically Signed   By: Lowella Grip III M.D.   On: 10/10/2015 12:39   Mm Digital Screening Bilateral  10/01/2015  CLINICAL DATA:  Screening. EXAM: DIGITAL SCREENING BILATERAL MAMMOGRAM WITH  CAD COMPARISON:  Previous exam(s). ACR Breast Density Category a: The breast tissue is almost entirely fatty. FINDINGS: There are no findings suspicious for malignancy. Images were processed with CAD. IMPRESSION: No mammographic evidence of malignancy. A result letter of this screening mammogram will be mailed directly to the patient. RECOMMENDATION: Screening mammogram in one year. (Code:SM-B-01Y) BI-RADS CATEGORY  1: Negative. Electronically Signed   By: Evangeline Dakin M.D.   On: 10/01/2015 15:01   I have personally reviewed and evaluated these images and lab results as part of my medical decision-making.   EKG Interpretation   Date/Time:  Wednesday Oct 10 2015 11:51:58 EDT Ventricular Rate:  71 PR Interval:  256 QRS Duration: 100 QT Interval:  440 QTC Calculation: 478 R Axis:   63 Text Interpretation:  Sinus rhythm with 1st degree A-V block with  occasional Premature ventricular complexes Otherwise normal ECG Confirmed  by Wilson Singer  MD, Santita Hunsberger ([id number]) on 10/10/2015 1:37:16 PM      MDM   Final diagnoses:  Chest pain, unspecified chest pain type    65 year old female chest pain. Atypical for ACS. Doubt PE, dissection or other emergent process.    Virgel Manifold, MD 10/23/15 1356

## 2015-10-26 DIAGNOSIS — M545 Low back pain: Secondary | ICD-10-CM | POA: Diagnosis not present

## 2015-10-31 ENCOUNTER — Ambulatory Visit (INDEPENDENT_AMBULATORY_CARE_PROVIDER_SITE_OTHER): Payer: Medicare Other | Admitting: Internal Medicine

## 2015-10-31 ENCOUNTER — Encounter: Payer: Self-pay | Admitting: Internal Medicine

## 2015-10-31 VITALS — BP 136/76 | HR 72 | Temp 98.1°F | Ht 69.0 in | Wt 306.4 lb

## 2015-10-31 DIAGNOSIS — I739 Peripheral vascular disease, unspecified: Secondary | ICD-10-CM | POA: Diagnosis not present

## 2015-10-31 DIAGNOSIS — R079 Chest pain, unspecified: Secondary | ICD-10-CM

## 2015-10-31 NOTE — Progress Notes (Signed)
Pre visit review using our clinic review tool, if applicable. No additional management support is needed unless otherwise documented below in the visit note. 

## 2015-10-31 NOTE — Progress Notes (Signed)
Subjective:    Patient ID: [name], female    DOB: 08-Oct-1950, 65 y.o.   MRN: [id number]  DOS:  10/31/2015 Type of visit - description : ED follow-up Interval history: Went to the ER 10/10/2015 with chest pain for several years, under the right breast, radiating to her back. Chest x-ray essentially negative, EKG with no acute changes Potassium 3.6, creatinine 1.1, hemoglobin normal, troponin 1 negative  Review of Systems Today he reports that she continue with on and off chest pain "for several years". I asked if the pain was different than before and she said no. Also, went to see the orthopedic doctor for claudication, x-rays were done, she has a follow-up with them. Was prescribed Flexeril Denies fever chills, occasional cough   Past Medical History  Diagnosis Date  . HYPERLIPIDEMIA 01/11/2009  . DEPRESSION 09/26/2008  . HYPERTENSION 10/06/2006  . DEGENERATIVE JOINT DISEASE 10/06/2006  . Pain in joint, multiple sites 11/10/2006  . INSOMNIA 09/26/2008  . CHEST PAIN 11/18/2007  . Diverticulosis     [id number]  . Internal hemorrhoids   . OBSTRUCTIVE SLEEP APNEA 06/23/2008    Severe OSA per sleep study 2010, Rx a CPAP  . DIABETES MELLITUS 10/06/2006  . UNSPECIFIED ANEMIA 12/10/2009  . GERD (gastroesophageal reflux disease) 07/25/2013  . Bronchospasm 05/28/2012    Past Surgical History  Procedure Laterality Date  . Right knee replacement  2005  . 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 Colonoscopy  08/01/2011    Procedure: COLONOSCOPY;  Surgeon: Inda Castle, MD;  Location: [facility];  Service: Endoscopy;  Laterality: N/A;    Social History   Social History  . Marital Status: Widowed    Spouse Name: N/A  . Number of Children: 4  . Years of Education: N/A   Occupational History  . disability    Social History Main Topics  . Smoking status: Former Smoker -- 0.20 packs/day for 4 years    Types: Cigarettes    Quit date: 05/13/1995  . Smokeless tobacco: Never Used  .  Alcohol Use: Yes     Comment: Rarely  . Drug Use: No  . Sexual Activity: Not on file   Other Topics Concern  . Not on file   Social History Narrative   Widow , lives by herself        Medication List       This list is accurate as of: 10/31/15 11:59 PM.  Always use your most recent med list.               ACCU-CHEK AVIVA PLUS test strip  Generic drug:  glucose blood  TEST TWICE A DAY AS DIRECTED     ACCU-CHEK AVIVA PLUS w/Device Kit  1 Device by Does not apply route once.     ACCU-CHEK SOFTCLIX LANCETS lancets  Use to check blood sugar 2 times per day. Dx Code: E11.9     albuterol 108 (90 Base) MCG/ACT inhaler  Commonly known as:  PROVENTIL HFA;VENTOLIN HFA  Inhale 2 puffs into the lungs every 4 (four) hours as needed for wheezing.     aspirin 325 MG tablet  Take 325 mg by mouth daily.     BD ULTRA-FINE PEN NEEDLES 29G X 12.7MM Misc  Generic drug:  Insulin Pen Needle  USE AS DIRECTED 3 TIMES A DAY     budesonide-formoterol 160-4.5 MCG/ACT inhaler  Commonly known as:  SYMBICORT  Inhale 2 puffs into the lungs 2 (two) times daily.  CRESTOR 40 MG tablet  Generic drug:  rosuvastatin  take 1 tablet by mouth once daily     cyclobenzaprine 5 MG tablet  Commonly known as:  FLEXERIL  Take 5-10 mg by mouth at bedtime as needed for muscle spasms.     Diclofenac Sodium 2 % Soln  Place 1 packet onto the skin 2 (two) times daily. Reported on 06/18/2015     diltiazem 360 MG 24 hr capsule  Commonly known as:  TAZTIA XT  Take 1 capsule (360 mg total) by mouth daily.     Insulin Lispro Prot & Lispro (50-50) 100 UNIT/ML Kwikpen  Commonly known as:  HUMALOG MIX 50/50 KWIKPEN  Inject 45 Units into the skin 2 (two) times daily with a meal. And pen needles 2/day     omeprazole 20 MG capsule  Commonly known as:  PRILOSEC  Take 1 capsule (20 mg total) by mouth daily.     potassium chloride 10 MEQ tablet  Commonly known as:  K-DUR,KLOR-CON  Take 1 tablet (10 mEq total)  by mouth daily.     telmisartan 40 MG tablet  Commonly known as:  MICARDIS  Take 1 tablet (40 mg total) by mouth daily.           Objective:   Physical Exam BP 136/76 mmHg  Pulse 72  Temp(Src) 98.1 F (36.7 C) (Oral)  Ht '5\' 9"'$  (1.753 m)  Wt 306 lb 6 oz (138.971 kg)  BMI 45.22 kg/m2  SpO2 97% General:   Well developed, well nourished . NAD.  HEENT:  Normocephalic . Face symmetric, atraumatic Lungs:  CTA B Normal respiratory effort, no intercostal retractions, no accessory muscle use. Heart: RRR,  no murmur.  No pretibial edema bilaterally  Skin: Not pale. Not jaundice Neurologic:  alert & oriented X3.  Speech normal, gait appropriate for age and unassisted Psych--  Cognition and judgment appear intact.  Cooperative with normal attention span and concentration.  Behavior appropriate. No anxious or depressed appearing.       Assessment & Plan:   Assessment> DM per endocrinology HTN Hyperlipidemia Depression, insomnia (citalopram caused headache ? 05-2014) Asthma MSK: --DJD, saw ortho ~06-2014 --Multiple joint pains, 2008, sedimentation rate, RA >> negative --Back pain>> -- MRI 2007: Epidural lipomatosis, congenitally short pedicles, spondylosis, and disc bulges and protrusions contribute to central, foraminal, and subarticular lateral recess stenoses as detailed above.  --2008, saw Ortho, Rx local injections  GERD Morbid obesity OSA dx 2010, severe, nocturnal desaturation, intolerant to CPAP, on nocturnal O2 DOE, chronic CP, chronic RUQ pain:: 2004--Cath neg, areterial LE dopplers (-) 06-2003-- abd. u/s fatty liver 01-2005-- CP...neg u/s GB, neg HIDA 10-06--saw cards--cardiolite neg 12-2007 abnormal stress test Cath 02-01-08: normal coronaries   PLAN: Chest pain: Going on for years, symptom unchanged, workup at the ER negative, last objective evaluation was 2009 with a normal cardiac catheterization. Advise patient to call if the pain changes in character  or frequency. Claudication: ABIs were mildly abnormal, I don't believe that explain possible claudication, saw orthopedic surgery, prescribed Flexeril, to have a follow-up with them soon. RTC 01-2016 as scheduled.

## 2015-11-01 NOTE — Assessment & Plan Note (Signed)
Chest pain: Going on for years, symptom unchanged, workup at the ER negative, last objective evaluation was 2009 with a normal cardiac catheterization. Advise patient to call if the pain changes in character or frequency. Claudication: ABIs were mildly abnormal, I don't believe that explain possible claudication, saw orthopedic surgery, prescribed Flexeril, to have a follow-up with them soon. RTC 01-2016 as scheduled.

## 2015-11-05 DIAGNOSIS — M545 Low back pain: Secondary | ICD-10-CM | POA: Diagnosis not present

## 2015-11-07 DIAGNOSIS — E119 Type 2 diabetes mellitus without complications: Secondary | ICD-10-CM | POA: Diagnosis not present

## 2015-11-07 DIAGNOSIS — H2513 Age-related nuclear cataract, bilateral: Secondary | ICD-10-CM | POA: Diagnosis not present

## 2015-11-07 DIAGNOSIS — H40013 Open angle with borderline findings, low risk, bilateral: Secondary | ICD-10-CM | POA: Diagnosis not present

## 2015-11-07 LAB — HM DIABETES EYE EXAM

## 2015-11-10 DIAGNOSIS — G4733 Obstructive sleep apnea (adult) (pediatric): Secondary | ICD-10-CM | POA: Diagnosis not present

## 2015-11-10 DIAGNOSIS — R0902 Hypoxemia: Secondary | ICD-10-CM | POA: Diagnosis not present

## 2015-11-21 DIAGNOSIS — L84 Corns and callosities: Secondary | ICD-10-CM | POA: Diagnosis not present

## 2015-11-21 DIAGNOSIS — L602 Onychogryphosis: Secondary | ICD-10-CM | POA: Diagnosis not present

## 2015-11-21 DIAGNOSIS — E1351 Other specified diabetes mellitus with diabetic peripheral angiopathy without gangrene: Secondary | ICD-10-CM | POA: Diagnosis not present

## 2015-11-23 ENCOUNTER — Encounter: Payer: Self-pay | Admitting: Internal Medicine

## 2015-12-03 DIAGNOSIS — M47816 Spondylosis without myelopathy or radiculopathy, lumbar region: Secondary | ICD-10-CM | POA: Diagnosis not present

## 2015-12-09 ENCOUNTER — Other Ambulatory Visit: Payer: Self-pay | Admitting: Internal Medicine

## 2015-12-11 DIAGNOSIS — R0902 Hypoxemia: Secondary | ICD-10-CM | POA: Diagnosis not present

## 2015-12-11 DIAGNOSIS — G4733 Obstructive sleep apnea (adult) (pediatric): Secondary | ICD-10-CM | POA: Diagnosis not present

## 2016-01-01 DIAGNOSIS — M47816 Spondylosis without myelopathy or radiculopathy, lumbar region: Secondary | ICD-10-CM | POA: Diagnosis not present

## 2016-01-02 ENCOUNTER — Ambulatory Visit (INDEPENDENT_AMBULATORY_CARE_PROVIDER_SITE_OTHER): Payer: Medicare Other | Admitting: Pulmonary Disease

## 2016-01-02 ENCOUNTER — Encounter: Payer: Self-pay | Admitting: Pulmonary Disease

## 2016-01-02 ENCOUNTER — Ambulatory Visit: Payer: Self-pay | Admitting: Pulmonary Disease

## 2016-01-02 ENCOUNTER — Other Ambulatory Visit: Payer: Self-pay | Admitting: Endocrinology

## 2016-01-02 VITALS — BP 138/82 | HR 89 | Ht 69.0 in | Wt 314.4 lb

## 2016-01-02 DIAGNOSIS — J45998 Other asthma: Secondary | ICD-10-CM | POA: Diagnosis not present

## 2016-01-02 DIAGNOSIS — IMO0002 Reserved for concepts with insufficient information to code with codable children: Secondary | ICD-10-CM

## 2016-01-02 DIAGNOSIS — J9611 Chronic respiratory failure with hypoxia: Secondary | ICD-10-CM | POA: Diagnosis not present

## 2016-01-02 DIAGNOSIS — Z6841 Body Mass Index (BMI) 40.0 and over, adult: Secondary | ICD-10-CM

## 2016-01-02 DIAGNOSIS — G4733 Obstructive sleep apnea (adult) (pediatric): Secondary | ICD-10-CM | POA: Diagnosis not present

## 2016-01-02 NOTE — Patient Instructions (Signed)
Will arrange for pulmonary function test  Call if you want to arrange for repeat sleep study  Follow up in 1 year

## 2016-01-02 NOTE — Progress Notes (Signed)
Current Outpatient Prescriptions on File Prior to Visit  Medication Sig  . ACCU-CHEK AVIVA PLUS test strip TEST TWICE A DAY AS DIRECTED  . ACCU-CHEK SOFTCLIX LANCETS lancets Use to check blood sugar 2 times per day. Dx Code: E11.9  . aspirin 325 MG tablet Take 325 mg by mouth daily.    . BD ULTRA-FINE PEN NEEDLES 29G X 12.7MM MISC USE AS DIRECTED 3 TIMES A DAY  . Blood Glucose Monitoring Suppl (ACCU-CHEK AVIVA PLUS) W/DEVICE KIT 1 Device by Does not apply route once.  . budesonide-formoterol (SYMBICORT) 160-4.5 MCG/ACT inhaler Inhale 2 puffs into the lungs 2 (two) times daily.  . CRESTOR 40 MG tablet take 1 tablet by mouth once daily  . cyclobenzaprine (FLEXERIL) 5 MG tablet Take 5-10 mg by mouth at bedtime as needed for muscle spasms.  . Diclofenac Sodium 2 % SOLN Place 1 packet onto the skin 2 (two) times daily. Reported on 06/18/2015  . diltiazem (TAZTIA XT) 360 MG 24 hr capsule Take 1 capsule (360 mg total) by mouth daily.  . Insulin Lispro Prot & Lispro (HUMALOG MIX 50/50 KWIKPEN) (50-50) 100 UNIT/ML Kwikpen Inject 45 Units into the skin 2 (two) times daily with a meal. And pen needles 2/day  . potassium chloride (K-DUR,KLOR-CON) 10 MEQ tablet Take 1 tablet (10 mEq total) by mouth daily.  Marland Kitchen telmisartan (MICARDIS) 40 MG tablet Take 1 tablet (40 mg total) by mouth daily.  Marland Kitchen albuterol (PROVENTIL HFA;VENTOLIN HFA) 108 (90 BASE) MCG/ACT inhaler Inhale 2 puffs into the lungs every 4 (four) hours as needed for wheezing.   No current facility-administered medications on file prior to visit.      Chief Complaint  Patient presents with  . Follow-up    Wears O2 at night @ 2 liters. Pt states that at times she has a hard going to sleep.      Tests PSG 07/11/08 >> AHI 88, SpO2 low 62%  Past medical hx Depression, DM, GERD, HLD. HTN, Asthma  Past surgical hx, Allergies, Family hx, Social hx all reviewed.  Vital Signs BP 138/82 (BP Location: Left Arm, Cuff Size: Large)   Pulse 89   Ht _0   (1.753 m)   Wt (!) 314 lb 6.4 oz (142.6 kg)   SpO2 96%   BMI 46.43 kg/m   History of Present Illness [name] is a 65 y.o. female former smoker with obstructive sleep apnea and asthma.  She wasn't able to use CPAP.  She had trouble figuring out how to use machine.  She has been using 2 liters oxygen at night.  Still has snoring, dry mouth, and sleep disruption.  Feels tired during the day.  She is not sure she could try CPAP again.  Her breathing is okay otherwise on most days.  She gets occasional cough and wheeze >> uses albuterol and feels better.  Using symbicort bid.     Physical Exam  General - No distress ENT - No sinus tenderness, no oral exudate, no LAN, MP 3 Cardiac - s1s2 regular, no murmur Chest - No wheeze/rales/dullness Back - No focal tenderness Abd - Soft, non-tender Ext - No edema Neuro - Normal strength Skin - No rashes Psych - normal mood, and behavior   Assessment/Plan  Obstructive sleep apnea. - intolerant of CPAP - advised her to call if she would want to reconsider therapy for sleep apnea >> would need repeat sleep study  Chronic respiratory failure with hypoxia. - continue 2 liters oxygen with sleep  Morbid obesity. - discussed importance of weight loss  Persistent asthma. - continue symbicort and prn albuterol - will arrange for PFTs and call her with results   Patient Instructions  Will arrange for pulmonary function test  Call if you want to arrange for repeat sleep study  Follow up in 1 year    Chesley Mires, MD [place] Pager:  [phone] 01/02/2016, 11:21 AM

## 2016-01-02 NOTE — Telephone Encounter (Signed)
Patient had her injectiont at her [facility] office, and it ran her b/s up 209 this morning now it is 308, please advise what to do.  She ask if you could call today.

## 2016-01-03 ENCOUNTER — Other Ambulatory Visit: Payer: Self-pay | Admitting: Endocrinology

## 2016-01-04 ENCOUNTER — Telehealth: Payer: Self-pay

## 2016-01-04 NOTE — Telephone Encounter (Signed)
I contacted the patient and advised of MD's instructions. Pt voiced understanding.

## 2016-01-04 NOTE — Telephone Encounter (Signed)
Pt called and stated on 01/01/2016 she received a steroid injection for her back and last night her blood sugar was 412 and this am fasting it was 304. Pt confirmed she is currently taking 50 units of Humalog 50/50 twice per day.  Please advise, Thanks!

## 2016-01-04 NOTE — Telephone Encounter (Signed)
The effect of the steroid injection takes approx 5 days to go away.  Until then: Whenever your blood sugar is in the 200's, take 5 extra units If over 300, take 10 extra units. Call if this does not help.

## 2016-01-05 ENCOUNTER — Other Ambulatory Visit: Payer: Self-pay | Admitting: Endocrinology

## 2016-01-05 NOTE — Telephone Encounter (Signed)
Please refer request to PCP 

## 2016-01-07 ENCOUNTER — Other Ambulatory Visit: Payer: Self-pay | Admitting: Internal Medicine

## 2016-01-11 DIAGNOSIS — R0902 Hypoxemia: Secondary | ICD-10-CM | POA: Diagnosis not present

## 2016-01-11 DIAGNOSIS — G4733 Obstructive sleep apnea (adult) (pediatric): Secondary | ICD-10-CM | POA: Diagnosis not present

## 2016-01-21 ENCOUNTER — Telehealth: Payer: Self-pay | Admitting: Internal Medicine

## 2016-01-21 NOTE — Telephone Encounter (Signed)
Completed.

## 2016-01-24 ENCOUNTER — Encounter: Payer: Self-pay | Admitting: Internal Medicine

## 2016-01-27 ENCOUNTER — Other Ambulatory Visit: Payer: Self-pay | Admitting: Internal Medicine

## 2016-01-28 ENCOUNTER — Telehealth: Payer: Self-pay | Admitting: Internal Medicine

## 2016-01-28 DIAGNOSIS — M47816 Spondylosis without myelopathy or radiculopathy, lumbar region: Secondary | ICD-10-CM | POA: Diagnosis not present

## 2016-01-28 NOTE — Telephone Encounter (Signed)
She has not been taking carvedilol to my knowledge. If her blood pressure is elevated, needs to let us know

## 2016-01-28 NOTE — Telephone Encounter (Signed)
LMOM informing Pt that per PCP, med was d/c and was not taking it as of 09/19/2015 OV and if BP's have been elevated she would need to let us know. Instructed her to call when message received.

## 2016-01-28 NOTE — Telephone Encounter (Signed)
Please advise, med d/c at [facility] on 09/19/2015.

## 2016-01-28 NOTE — Telephone Encounter (Signed)
°  Relationship to patient: Self  Can be reached: [phone]   Pharmacy:  [facility], [place] [phone] (Phone) [phone] (Fax)    Reason for call: Request refill on carvedilol (COREG) 25 MG tablet [id number]

## 2016-01-29 ENCOUNTER — Other Ambulatory Visit: Payer: Self-pay | Admitting: Physical Medicine and Rehabilitation

## 2016-01-29 DIAGNOSIS — M47816 Spondylosis without myelopathy or radiculopathy, lumbar region: Secondary | ICD-10-CM

## 2016-02-08 ENCOUNTER — Ambulatory Visit
Admission: RE | Admit: 2016-02-08 | Discharge: 2016-02-08 | Disposition: A | Discharge status: Home / Self Care | Payer: Medicare Other | Source: Ambulatory Visit | Attending: Physical Medicine and Rehabilitation | Admitting: Physical Medicine and Rehabilitation

## 2016-02-08 DIAGNOSIS — M47816 Spondylosis without myelopathy or radiculopathy, lumbar region: Secondary | ICD-10-CM

## 2016-02-08 DIAGNOSIS — M4806 Spinal stenosis, lumbar region: Secondary | ICD-10-CM | POA: Diagnosis not present

## 2016-02-10 DIAGNOSIS — G4733 Obstructive sleep apnea (adult) (pediatric): Secondary | ICD-10-CM | POA: Diagnosis not present

## 2016-02-10 DIAGNOSIS — R0902 Hypoxemia: Secondary | ICD-10-CM | POA: Diagnosis not present

## 2016-02-15 ENCOUNTER — Ambulatory Visit: Payer: Self-pay | Admitting: Endocrinology

## 2016-02-20 DIAGNOSIS — L602 Onychogryphosis: Secondary | ICD-10-CM | POA: Diagnosis not present

## 2016-02-20 DIAGNOSIS — L84 Corns and callosities: Secondary | ICD-10-CM | POA: Diagnosis not present

## 2016-02-20 DIAGNOSIS — E1351 Other specified diabetes mellitus with diabetic peripheral angiopathy without gangrene: Secondary | ICD-10-CM | POA: Diagnosis not present

## 2016-02-22 DIAGNOSIS — M47816 Spondylosis without myelopathy or radiculopathy, lumbar region: Secondary | ICD-10-CM | POA: Diagnosis not present

## 2016-02-25 ENCOUNTER — Encounter: Payer: Self-pay | Admitting: Endocrinology

## 2016-02-25 ENCOUNTER — Ambulatory Visit (INDEPENDENT_AMBULATORY_CARE_PROVIDER_SITE_OTHER): Payer: Medicare Other | Admitting: Endocrinology

## 2016-02-25 VITALS — BP 160/84 | HR 99 | Ht 69.0 in | Wt 315.0 lb

## 2016-02-25 DIAGNOSIS — Z794 Long term (current) use of insulin: Secondary | ICD-10-CM | POA: Diagnosis not present

## 2016-02-25 DIAGNOSIS — Z23 Encounter for immunization: Secondary | ICD-10-CM | POA: Diagnosis not present

## 2016-02-25 DIAGNOSIS — E119 Type 2 diabetes mellitus without complications: Secondary | ICD-10-CM

## 2016-02-25 LAB — POCT GLYCOSYLATED HEMOGLOBIN (HGB A1C): HEMOGLOBIN A1C: 9

## 2016-02-25 NOTE — Patient Instructions (Addendum)
Please make a follow-up appointment in 3 months.   On this type of insulin schedule, you should eat meals on a regular schedule.  If a meal is missed or significantly delayed, your blood sugar could go low. check your blood sugar 2 times a day.  vary the time of day when you check, between before the 3 meals, and at bedtime.  also check if you have symptoms of your blood sugar being too high or too low.  please keep a record of the readings and bring it to your next appointment here.  please call us sooner if your blood sugar goes below 70, or if it stays over 200.    Please continue the same insulin: 45 units twice a day (just before the first and last meals of the day).  However, on Sunday mornings, take just 10 units.   Each time you get a steroid shot, your blood sugar will go up again.  The effect of each shot is gone in approx 5 days.  During this time only, take 5 extra units on insulin for any blood sugar over 200.

## 2016-02-25 NOTE — Progress Notes (Signed)
 Subjective:    Patient ID: [name], female    DOB: 08/30/1950, 65 y.o.   MRN: [id number]  HPI Pt returns for f/u of diabetes mellitus: DM type: Insulin-requiring type 2 Dx'ed: 2002 Complications: polyneuropathy Therapy: insulin since 2004 GDM: never DKA: never Severe hypoglycemia: never.  Pancreatitis: never.   Other: she declines weight-loss surgery; she did not achieve good glycemic control with multiple daily injections, but she has done better with a BID insulin regimen.  She has takes less insulin on Sunday mornings, due to hypoglycemia then.   Interval history: she brings a record of her cbg's which i have reviewed today.  It varies from 130-300's.  she has had 4 steroid injections into the lower back, over the past month.  She says after each injection, cbg's go up to the 300's Past Medical History:  Diagnosis Date  . Anterolisthesis    Grade 1, L4-5  . Bronchospasm 05/28/2012  . CHEST PAIN 11/18/2007  . DEGENERATIVE JOINT DISEASE 10/06/2006  . DEPRESSION 09/26/2008  . DIABETES MELLITUS 10/06/2006  . Diverticulosis    2008,2005  . GERD (gastroesophageal reflux disease) 07/25/2013  . HYPERLIPIDEMIA 01/11/2009  . HYPERTENSION 10/06/2006  . INSOMNIA 09/26/2008  . Internal hemorrhoids   . OBSTRUCTIVE SLEEP APNEA 06/23/2008   Severe OSA per sleep study 2010, Rx a CPAP  . Pain in joint, multiple sites 11/10/2006  . UNSPECIFIED ANEMIA 12/10/2009    Past Surgical History:  Procedure Laterality Date  . COLONOSCOPY  08/01/2011   Procedure: COLONOSCOPY;  Surgeon: Robert D Kaplan, MD;  Location: [facility];  Service: Endoscopy;  Laterality: N/A;  . 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 Right knee replacement  2005    Social History   Social History  . Marital status: Widowed    Spouse name: N/A  . Number of children: 4  . Years of education: N/A   Occupational History  . disability Unemployed   Social History Main Topics  . Smoking status: Former Smoker    Packs/day:  0.20    Years: 4.00    Types: Cigarettes    Quit date: 05/13/1995  . Smokeless tobacco: Never Used  . Alcohol use Yes     Comment: Rarely  . Drug use: No  . Sexual activity: Not on file   Other Topics Concern  . Not on file   Social History Narrative   Widow , lives by herself    Current Outpatient Prescriptions on File Prior to Visit  Medication Sig Dispense Refill  . ACCU-CHEK AVIVA PLUS test strip TEST TWICE A DAY AS DIRECTED 100 each 2  . ACCU-CHEK SOFTCLIX LANCETS lancets Use to check blood sugar 2 times per day. Dx Code: E11.9 100 each 12  . aspirin 325 MG tablet Take 325 mg by mouth daily.      . BD ULTRA-FINE PEN NEEDLES 29G X 12.7MM MISC USE AS DIRECTED 3 TIMES A DAY 100 each 3  . Blood Glucose Monitoring Suppl (ACCU-CHEK AVIVA PLUS) W/DEVICE KIT 1 Device by Does not apply route once. 1 kit 0  . budesonide-formoterol (SYMBICORT) 160-4.5 MCG/ACT inhaler Inhale 2 puffs into the lungs 2 (two) times daily. 10.2 g 5  . cyclobenzaprine (FLEXERIL) 5 MG tablet Take 5-10 mg by mouth at bedtime as needed for muscle spasms.    . Diclofenac Sodium 2 % SOLN Place 1 packet onto the skin 2 (two) times daily. Reported on 06/18/2015    . diltiazem (TIAZAC) 360 MG 24 hr capsule   Take 1 capsule (360 mg total) by mouth daily. 90 capsule 1  . Insulin Lispro Prot & Lispro (HUMALOG MIX 50/50 KWIKPEN) (50-50) 100 UNIT/ML Kwikpen Inject 45 Units into the skin 2 (two) times daily with a meal. And pen needles 2/day 30 mL 11  . potassium chloride (K-DUR,KLOR-CON) 10 MEQ tablet Take 1 tablet (10 mEq total) by mouth daily. 90 tablet 1  . rosuvastatin (CRESTOR) 40 MG tablet Take 1 tablet (40 mg total) by mouth daily. 90 tablet 1  . telmisartan (MICARDIS) 40 MG tablet Take 1 tablet (40 mg total) by mouth daily. 90 tablet 1  . albuterol (PROVENTIL HFA;VENTOLIN HFA) 108 (90 BASE) MCG/ACT inhaler Inhale 2 puffs into the lungs every 4 (four) hours as needed for wheezing. 1 Inhaler 6   No current  facility-administered medications on file prior to visit.     No Known Allergies  Family History  Problem Relation Age of Onset  . Asthma Mother   . Stroke Mother   . Diabetes Other     M, B, S  . Hypertension Sister     M, S,B  . Pancreatic cancer Brother   . Colon cancer Neg Hx   . Prostate cancer Neg Hx   . Breast cancer Neg Hx     BP (!) 160/84   Pulse 99   Ht 5' 9" (1.753 m)   Wt (!) 315 lb (142.9 kg)   SpO2 96%   BMI 46.52 kg/m    Review of Systems She denies hypoglycemia    Objective:   Physical Exam VITAL SIGNS:  See vs page GENERAL: no distress Pulses: dorsalis pedis intact bilat.  MSK: no deformity of the feet.  CV: trace bilat leg edema, and bilat vv's Skin: no ulcer on the feet. normal color and temp on the feet.  Neuro: sensation is intact to touch on the feet.  Ext: There is bilateral onychomycosis of the toenails.    Lab Results  Component Value Date   HGBA1C 9.0 02/25/2016      Assessment & Plan:  Insulin-requiring type 2 DM: worse.  Low-back pain: steroid injections are worsening glycemic control.    Patient is advised the following: Patient Instructions  Please make a follow-up appointment in 3 months.   On this type of insulin schedule, you should eat meals on a regular schedule.  If a meal is missed or significantly delayed, your blood sugar could go low. check your blood sugar 2 times a day.  vary the time of day when you check, between before the 3 meals, and at bedtime.  also check if you have symptoms of your blood sugar being too high or too low.  please keep a record of the readings and bring it to your next appointment here.  please call us sooner if your blood sugar goes below 70, or if it stays over 200.    Please continue the same insulin: 45 units twice a day (just before the first and last meals of the day).  However, on Sunday mornings, take just 10 units.   Each time you get a steroid shot, your blood sugar will go up  again.  The effect of each shot is gone in approx 5 days.  During this time only, take 5 extra units on insulin for any blood sugar over 200.      

## 2016-03-03 ENCOUNTER — Ambulatory Visit (INDEPENDENT_AMBULATORY_CARE_PROVIDER_SITE_OTHER): Payer: Medicare Other | Admitting: Pulmonary Disease

## 2016-03-03 DIAGNOSIS — J4540 Moderate persistent asthma, uncomplicated: Secondary | ICD-10-CM

## 2016-03-12 DIAGNOSIS — G4733 Obstructive sleep apnea (adult) (pediatric): Secondary | ICD-10-CM | POA: Diagnosis not present

## 2016-03-12 DIAGNOSIS — R0902 Hypoxemia: Secondary | ICD-10-CM | POA: Diagnosis not present

## 2016-03-18 ENCOUNTER — Other Ambulatory Visit: Payer: Self-pay | Admitting: Endocrinology

## 2016-03-26 ENCOUNTER — Encounter: Payer: Self-pay | Admitting: Internal Medicine

## 2016-03-26 ENCOUNTER — Ambulatory Visit (INDEPENDENT_AMBULATORY_CARE_PROVIDER_SITE_OTHER): Payer: Medicare Other | Admitting: Internal Medicine

## 2016-03-26 VITALS — BP 138/82 | HR 92 | Temp 97.8°F | Resp 14 | Ht 69.0 in | Wt 312.5 lb

## 2016-03-26 DIAGNOSIS — Z0000 Encounter for general adult medical examination without abnormal findings: Secondary | ICD-10-CM | POA: Diagnosis not present

## 2016-03-26 DIAGNOSIS — Z01419 Encounter for gynecological examination (general) (routine) without abnormal findings: Secondary | ICD-10-CM

## 2016-03-26 MED ORDER — BETAMETHASONE DIPROPIONATE AUG 0.05 % EX CREA: TOPICAL_CREAM | Freq: Two times a day (BID) | CUTANEOUS | 0 refills | Status: DC

## 2016-03-26 NOTE — Patient Instructions (Signed)
GO TO THE LAB : Get the blood work     GO TO THE FRONT DESK Schedule your next appointment for a  routine checkup in 6-8 months   Use the cream twice a day until better Call if not improving in the next week.

## 2016-03-26 NOTE — Assessment & Plan Note (Signed)
Tdap 1-14;  PNM 23 1-14;  prevnar   10/2013; zostavax rx provided before   Flu shot 02-2016 Female care: PAP   last 12-2011 (-), refer to gynecology [facility] 09-2015 (-)  Cscope 2013, next 10 years DEXA (-) 2010 and 11-2013 Diet and exercise discussed Labs: BMP, AST, ALT, FLP, TSH

## 2016-03-26 NOTE — Progress Notes (Signed)
Subjective:    Patient ID: [name], female    DOB: 1951-04-14, 65 y.o.   MRN: [id number]  DOS:  03/26/2016 Type of visit - description : CPX Interval history: Has a number of symptoms, they are chronic and not getting worse. See review of systems. She did develop a new rash 3 days ago on the arms, extremely itchy, Reports CBGs increase for the last few months since she had a local steroid injection. Already had her flu shot.   Review of Systems  Constitutional: No fever. No chills. No unexplained wt changes. No unusual sweats  HEENT: No dental problems, no ear discharge, no facial swelling, no voice changes. No eye discharge, but occasionally eyes are red. She already saw the eye doctor   Respiratory: occ  wheezing, not a new issue,  . No cough , no mucus production  Cardiovascular:  occ chest pain, unchanged from previous.  no leg swelling , no  Palpitations  GI: no nausea, no vomiting, no diarrhea , no  abdominal pain.  No blood in the stools. No dysphagia, no odynophagia    Endocrine: No polyphagia, no polyuria , no polydipsia  GU: No dysuria, gross hematuria, difficulty urinating. No urinary urgency, no frequency.  Musculoskeletal: No joint swellings . Has chronic back pain. Sees Dr. 1  Skin: No change in the color of the skin, palor. Complaining of a rash, see history of present illness.  Allergic, immunologic: No environmental allergies , no  food allergies  Neurological: No dizziness no  syncope. No headaches. No diplopia, no slurred, no slurred speech, no motor deficits, no facial  Numbness  Hematological: No enlarged lymph nodes, no easy bruising , no unusual bleedings  Psychiatry: No suicidal ideas, no hallucinations, no beavior problems, no confusion.  No unusual/severe anxiety, no depression    Past Medical History:  Diagnosis Date  . Anterolisthesis    Grade 1, L4-5  . Bronchospasm 05/28/2012  . CHEST PAIN 11/18/2007  . DEGENERATIVE JOINT  DISEASE 10/06/2006  . DEPRESSION 09/26/2008  . DIABETES MELLITUS 10/06/2006  . Diverticulosis    [id number]  . GERD (gastroesophageal reflux disease) 07/25/2013  . HYPERLIPIDEMIA 01/11/2009  . HYPERTENSION 10/06/2006  . INSOMNIA 09/26/2008  . Internal hemorrhoids   . OBSTRUCTIVE SLEEP APNEA 06/23/2008   Severe OSA per sleep study 2010, Rx a CPAP  . Pain in joint, multiple sites 11/10/2006  . UNSPECIFIED ANEMIA 12/10/2009    Past Surgical History:  Procedure Laterality Date  . COLONOSCOPY  08/01/2011   Procedure: COLONOSCOPY;  Surgeon: Inda Castle, MD;  Location: [facility];  Service: Endoscopy;  Laterality: N/A;  . 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 Right knee replacement  2005    Social History   Social History  . Marital status: Widowed    Spouse name: N/A  . Number of children: 4  . Years of education: N/A   Occupational History  . disability Unemployed   Social History Main Topics  . Smoking status: Former Smoker    Packs/day: 0.20    Years: 4.00    Types: Cigarettes    Quit date: 05/13/1995  . Smokeless tobacco: Never Used  . Alcohol use Yes     Comment: Rarely  . Drug use: No  . Sexual activity: Not on file   Other Topics Concern  . Not on file   Social History Narrative   Widow , lives by herself   Lost a son, 3 living    Family History  Problem Relation Age of Onset  . Asthma Mother   . Stroke Mother   . Diabetes Other     M, B, S  . Hypertension Sister     M, S,B  . Pancreatic cancer Brother   . Colon cancer Neg Hx   . Prostate cancer Neg Hx   . Breast cancer Neg Hx         Medication List       Accurate as of 03/26/16 11:59 PM. Always use your most recent med list.          ACCU-CHEK AVIVA PLUS test strip Generic drug:  glucose blood TEST twice a day as directed   ACCU-CHEK AVIVA PLUS w/Device Kit 1 Device by Does not apply route once.   ACCU-CHEK SOFTCLIX LANCETS lancets Use to check blood sugar 2 times per day. Dx Code: E11.9     albuterol 108 (90 Base) MCG/ACT inhaler Commonly known as:  PROVENTIL HFA;VENTOLIN HFA Inhale 2 puffs into the lungs every 4 (four) hours as needed for wheezing.   aspirin 325 MG tablet Take 325 mg by mouth daily.   augmented betamethasone dipropionate 0.05 % cream Commonly known as:  DIPROLENE-AF Apply topically 2 (two) times daily.   BD ULTRA-FINE PEN NEEDLES 29G X 12.7MM Misc Generic drug:  Insulin Pen Needle USE AS DIRECTED 3 TIMES A DAY   budesonide-formoterol 160-4.5 MCG/ACT inhaler Commonly known as:  SYMBICORT Inhale 2 puffs into the lungs 2 (two) times daily.   cyclobenzaprine 5 MG tablet Commonly known as:  FLEXERIL Take 5-10 mg by mouth at bedtime as needed for muscle spasms.   Diclofenac Sodium 2 % Soln Place 1 packet onto the skin 2 (two) times daily. Reported on 06/18/2015   diltiazem 360 MG 24 hr capsule Commonly known as:  TIAZAC Take 1 capsule (360 mg total) by mouth daily.   Insulin Lispro Prot & Lispro (50-50) 100 UNIT/ML Kwikpen Commonly known as:  HUMALOG MIX 50/50 KWIKPEN Inject 45 Units into the skin 2 (two) times daily with a meal. And pen needles 2/day   potassium chloride 10 MEQ tablet Commonly known as:  K-DUR,KLOR-CON Take 1 tablet (10 mEq total) by mouth daily.   rosuvastatin 40 MG tablet Commonly known as:  CRESTOR Take 1 tablet (40 mg total) by mouth daily.   telmisartan 40 MG tablet Commonly known as:  MICARDIS Take 1 tablet (40 mg total) by mouth daily.          Objective:   Physical Exam BP 138/82 (BP Location: Left Arm, Patient Position: Sitting, Cuff Size: Normal)   Pulse 92   Temp 97.8 F (36.6 C) (Oral)   Resp 14   Ht _0  (1.753 m)   Wt (!) 312 lb 8 oz (141.7 kg)   SpO2 96%   BMI 46.15 kg/m   General:   Well developed, well nourished . NAD.  Neck: No  thyromegaly  HEENT:  Normocephalic . Face symmetric, atraumatic Lungs:  CTA B Normal respiratory effort, no intercostal retractions, no accessory muscle  use. Heart: RRR,  no murmur.  No pretibial edema bilaterally  Abdomen:  Not distended, soft, non-tender. No rebound or rigidity.   Skin: Symmetric rash at the upper extremities mostly at the lateral arm and forearm. See picture for representative portion of the rash. Wrists and interdigital area with normal skin. Neurologic:  alert & oriented X3.  Speech normal, gait appropriate for age and unassisted Strength symmetric and appropriate for age.  Psych: Cognition  and judgment appear intact.  Cooperative with normal attention span and concentration.  Behavior appropriate. No anxious or depressed appearing.         Assessment & Plan:   Assessment DM per endocrinology HTN Hyperlipidemia Depression, insomnia (citalopram caused headache ? 05-2014) Asthma MSK: --DJD, saw ortho ~06-2014 --Multiple joint pains, 2008, sedimentation rate, RA >> negative --Back pain>> -- MRI 2007: Epidural lipomatosis, congenitally short pedicles, spondylosis, and disc bulges and protrusions contribute to central, foraminal, and subarticular lateral recess stenoses as detailed above.  --2008, saw Ortho, Rx local injections  --local injections back Dr Mina Marble  ~ 12-2015 GERD Morbid obesity OSA dx 2010, severe, nocturnal desaturation, intolerant to CPAP, on nocturnal O2 DOE, chronic CP, chronic RUQ pain:: 2004--Cath neg, areterial LE dopplers (-) 06-2003-- abd. u/s fatty liver 01-2005-- CP...neg u/s GB, neg HIDA 10-06--saw cards--cardiolite neg 12-2007 abnormal stress test Cath 02-01-08: normal coronaries   PLAN: HTN: Seems controlled. Continue CCBs, potassium, Micardis. Checking labs High cholesterol: Continue Crestor, checking labs Depression and insomnia: Not an issue at this point. DM: Per endocrinology Back pain: Chronic issue, s/p local injections 3 months ago. Rash: New, no blisters, bed bugs? Trial with high potency topical steroids Call if no better. RTC 6-8 months

## 2016-03-26 NOTE — Progress Notes (Signed)
Pre visit review using our clinic review tool, if applicable. No additional management support is needed unless otherwise documented below in the visit note. 

## 2016-03-27 LAB — BASIC METABOLIC PANEL
BUN: 16 mg/dL (ref 6–23)
CHLORIDE: 101 meq/L (ref 96–112)
CO2: 28 mEq/L (ref 19–32)
CREATININE: 1.02 mg/dL (ref 0.40–1.20)
Calcium: 9.8 mg/dL (ref 8.4–10.5)
GFR: 69.52 mL/min (ref >60.00)
Glucose, Bld: 165 mg/dL — ABNORMAL HIGH (ref 70–99)
POTASSIUM: 3.9 meq/L (ref 3.5–5.1)
Sodium: 140 mEq/L (ref 135–145)

## 2016-03-27 LAB — AST: AST: 20 U/L (ref 0–37)

## 2016-03-27 LAB — ALT: ALT: 18 U/L (ref 0–35)

## 2016-03-27 LAB — LIPID PANEL
Cholesterol: 197 mg/dL (ref 0–200)
HDL: 61.5 mg/dL (ref >39.00)
LDL Cholesterol: 107 mg/dL — ABNORMAL HIGH (ref 0–99)
NONHDL: 135.06
TRIGLYCERIDES: 141 mg/dL (ref 0.0–149.0)
Total CHOL/HDL Ratio: 3
VLDL: 28.2 mg/dL (ref 0.0–40.0)

## 2016-03-27 LAB — TSH: TSH: 6.24 u[IU]/mL — ABNORMAL HIGH (ref 0.35–4.50)

## 2016-03-27 NOTE — Assessment & Plan Note (Signed)
HTN: Seems controlled. Continue CCBs, potassium, Micardis. Checking labs High cholesterol: Continue Crestor, checking labs Depression and insomnia: Not an issue at this point. DM: Per endocrinology Back pain: Chronic issue, s/p local injections 3 months ago. Rash: New, no blisters, bed bugs? Trial with high potency topical steroids Call if no better. RTC 6-8 months

## 2016-04-11 DIAGNOSIS — G4733 Obstructive sleep apnea (adult) (pediatric): Secondary | ICD-10-CM | POA: Diagnosis not present

## 2016-04-11 DIAGNOSIS — R0902 Hypoxemia: Secondary | ICD-10-CM | POA: Diagnosis not present

## 2016-05-12 DIAGNOSIS — R0902 Hypoxemia: Secondary | ICD-10-CM | POA: Diagnosis not present

## 2016-05-12 DIAGNOSIS — G4733 Obstructive sleep apnea (adult) (pediatric): Secondary | ICD-10-CM | POA: Diagnosis not present

## 2016-05-22 DIAGNOSIS — L84 Corns and callosities: Secondary | ICD-10-CM | POA: Diagnosis not present

## 2016-05-22 DIAGNOSIS — L602 Onychogryphosis: Secondary | ICD-10-CM | POA: Diagnosis not present

## 2016-05-22 DIAGNOSIS — E1351 Other specified diabetes mellitus with diabetic peripheral angiopathy without gangrene: Secondary | ICD-10-CM | POA: Diagnosis not present

## 2016-05-26 NOTE — Progress Notes (Signed)
Subjective:    Patient ID: [name], female    DOB: [birth year], 66 y.o.   MRN: [id number]  HPI Pt returns for f/u of diabetes mellitus: DM type: Insulin-requiring type 2 Dx'ed: 4128 Complications: polyneuropathy Therapy: insulin since 2004 GDM: never DKA: never Severe hypoglycemia: never.  Pancreatitis: never.   Other: she declines weight-loss surgery; she did not achieve good glycemic control with multiple daily injections, but she has done better with a BID insulin regimen.  She has takes less insulin on Sunday mornings, due to hypoglycemia then.   Interval history: No recent steroids.  she brings a record of her cbg's which i have reviewed today.  It varies from 95-200's.  There is no trend throughout the day. She says she never misses the insulin.   Past Medical History:  Diagnosis Date  . Anterolisthesis    Grade 1, L4-5  . Bronchospasm 05/28/2012  . CHEST PAIN 11/18/2007  . DEGENERATIVE JOINT DISEASE 10/06/2006  . DEPRESSION 09/26/2008  . DIABETES MELLITUS 10/06/2006  . Diverticulosis    [id number]  . GERD (gastroesophageal reflux disease) 07/25/2013  . HYPERLIPIDEMIA 01/11/2009  . HYPERTENSION 10/06/2006  . INSOMNIA 09/26/2008  . Internal hemorrhoids   . OBSTRUCTIVE SLEEP APNEA 06/23/2008   Severe OSA per sleep study 2010, Rx a CPAP  . Pain in joint, multiple sites 11/10/2006  . UNSPECIFIED ANEMIA 12/10/2009    Past Surgical History:  Procedure Laterality Date  . COLONOSCOPY  08/01/2011   Procedure: COLONOSCOPY;  Surgeon: Inda Castle, MD;  Location: [facility];  Service: Endoscopy;  Laterality: N/A;  . 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 Right knee replacement  2005    Social History   Social History  . Marital status: Widowed    Spouse name: N/A  . Number of children: 4  . Years of education: N/A   Occupational History  . disability Unemployed   Social History Main Topics  . Smoking status: Former Smoker    Packs/day: 0.20    Years: 4.00    Types:  Cigarettes    Quit date: 05/13/1995  . Smokeless tobacco: Never Used  . Alcohol use Yes     Comment: Rarely  . Drug use: No  . Sexual activity: Not on file   Other Topics Concern  . Not on file   Social History Narrative   Widow , lives by herself   Lost a son, 3 living     Current Outpatient Prescriptions on File Prior to Visit  Medication Sig Dispense Refill  . ACCU-CHEK AVIVA PLUS test strip TEST twice a day as directed 100 each 2  . ACCU-CHEK SOFTCLIX LANCETS lancets Use to check blood sugar 2 times per day. Dx Code: E11.9 100 each 12  . aspirin 325 MG tablet Take 325 mg by mouth daily.      Marland Kitchen augmented betamethasone dipropionate (DIPROLENE-AF) 0.05 % cream Apply topically 2 (two) times daily. 60 g 0  . BD ULTRA-FINE PEN NEEDLES 29G X 12.7MM MISC USE AS DIRECTED 3 TIMES A DAY 100 each 3  . Blood Glucose Monitoring Suppl (ACCU-CHEK AVIVA PLUS) W/DEVICE KIT 1 Device by Does not apply route once. 1 kit 0  . budesonide-formoterol (SYMBICORT) 160-4.5 MCG/ACT inhaler Inhale 2 puffs into the lungs 2 (two) times daily. 10.2 g 5  . Diclofenac Sodium 2 % SOLN Place 1 packet onto the skin 2 (two) times daily. Reported on 06/18/2015    . diltiazem (TIAZAC) 360 MG 24 hr capsule Take  1 capsule (360 mg total) by mouth daily. 90 capsule 1  . potassium chloride (K-DUR,KLOR-CON) 10 MEQ tablet Take 1 tablet (10 mEq total) by mouth daily. 90 tablet 1  . rosuvastatin (CRESTOR) 40 MG tablet Take 1 tablet (40 mg total) by mouth daily. 90 tablet 1  . telmisartan (MICARDIS) 40 MG tablet Take 1 tablet (40 mg total) by mouth daily. 90 tablet 1  . albuterol (PROVENTIL HFA;VENTOLIN HFA) 108 (90 BASE) MCG/ACT inhaler Inhale 2 puffs into the lungs every 4 (four) hours as needed for wheezing. 1 Inhaler 6  . cyclobenzaprine (FLEXERIL) 5 MG tablet Take 5-10 mg by mouth at bedtime as needed for muscle spasms.     No current facility-administered medications on file prior to visit.     No Known Allergies  Family  History  Problem Relation Age of Onset  . Asthma Mother   . Stroke Mother   . Diabetes Other     M, B, S  . Hypertension Sister     M, S,B  . Pancreatic cancer Brother   . Colon cancer Neg Hx   . Prostate cancer Neg Hx   . Breast cancer Neg Hx     BP (!) 142/94   Pulse 92   Ht '5\' 9"'$  (1.753 m)   Wt (!) 313 lb (142 kg)   SpO2 96%   BMI 46.22 kg/m   Review of Systems She says she has been having intermittent chest pain for 4 years.  No recent change.      Objective:   Physical Exam VITAL SIGNS:  See vs page GENERAL: no distress Pulses: dorsalis pedis intact bilat.  MSK: no deformity of the feet.  CV: trace bilat leg edema, and bilat vv's Skin: no ulcer on the feet. normal color and temp on the feet.  Neuro: sensation is intact to touch on the feet.  Ext: There is bilateral onychomycosis of the toenails.   A1c=8.9%    Assessment & Plan:  Insulin-requiring type 2 DM: worse.  Low-back pain: steroid injections are worsening glycemic control.  Chest pain: chronic but new to me.    Patient is advised the following: Patient Instructions  Please make a follow-up appointment in 2 months.   On this type of insulin schedule, you should eat meals on a regular schedule.  If a meal is missed or significantly delayed, your blood sugar could go low. check your blood sugar 2 times a day.  vary the time of day when you check, between before the 3 meals, and at bedtime.  also check if you have symptoms of your blood sugar being too high or too low.  please keep a record of the readings and bring it to your next appointment here.  please call us sooner if your blood sugar goes below 70, or if it stays over 200.   Let's check a treadmill test.  you will receive a phone call, about a day and time for an appointment Please increase the insulin to 50 units twice a day (just before the first and last meals of the day).  However, on Sunday mornings, take just 10 units.

## 2016-05-27 ENCOUNTER — Encounter: Payer: Self-pay | Admitting: Endocrinology

## 2016-05-27 ENCOUNTER — Ambulatory Visit (INDEPENDENT_AMBULATORY_CARE_PROVIDER_SITE_OTHER): Payer: Medicare Other | Admitting: Endocrinology

## 2016-05-27 VITALS — BP 142/94 | HR 92 | Ht 69.0 in | Wt 313.0 lb

## 2016-05-27 DIAGNOSIS — E119 Type 2 diabetes mellitus without complications: Secondary | ICD-10-CM | POA: Diagnosis not present

## 2016-05-27 DIAGNOSIS — R079 Chest pain, unspecified: Secondary | ICD-10-CM

## 2016-05-27 DIAGNOSIS — Z794 Long term (current) use of insulin: Secondary | ICD-10-CM | POA: Diagnosis not present

## 2016-05-27 LAB — POCT GLYCOSYLATED HEMOGLOBIN (HGB A1C): Hemoglobin A1C: 8.9

## 2016-05-27 MED ORDER — INSULIN LISPRO PROT & LISPRO (50-50 MIX) 100 UNIT/ML KWIKPEN: 45.0000 [IU] | PEN_INJECTOR | Freq: Two times a day (BID) | SUBCUTANEOUS | 11 refills | Status: DC

## 2016-05-27 NOTE — Patient Instructions (Addendum)
Please make a follow-up appointment in 2 months.   On this type of insulin schedule, you should eat meals on a regular schedule.  If a meal is missed or significantly delayed, your blood sugar could go low. check your blood sugar 2 times a day.  vary the time of day when you check, between before the 3 meals, and at bedtime.  also check if you have symptoms of your blood sugar being too high or too low.  please keep a record of the readings and bring it to your next appointment here.  please call us sooner if your blood sugar goes below 70, or if it stays over 200.   Let's check a treadmill test.  you will receive a phone call, about a day and time for an appointment Please increase the insulin to 50 units twice a day (just before the first and last meals of the day).  However, on Sunday mornings, take just 10 units.

## 2016-06-01 ENCOUNTER — Other Ambulatory Visit: Payer: Self-pay | Admitting: Endocrinology

## 2016-06-09 ENCOUNTER — Other Ambulatory Visit: Payer: Self-pay | Admitting: Internal Medicine

## 2016-06-12 DIAGNOSIS — G4733 Obstructive sleep apnea (adult) (pediatric): Secondary | ICD-10-CM | POA: Diagnosis not present

## 2016-06-12 DIAGNOSIS — R0902 Hypoxemia: Secondary | ICD-10-CM | POA: Diagnosis not present

## 2016-07-01 ENCOUNTER — Ambulatory Visit (INDEPENDENT_AMBULATORY_CARE_PROVIDER_SITE_OTHER): Payer: Medicare Other

## 2016-07-01 DIAGNOSIS — R079 Chest pain, unspecified: Secondary | ICD-10-CM

## 2016-07-01 LAB — EXERCISE TOLERANCE TEST
CHL CUP STRESS STAGE 1 GRADE: 0 %
CHL CUP STRESS STAGE 2 GRADE: 0 %
CHL CUP STRESS STAGE 2 SPEED: 0 mph
CHL CUP STRESS STAGE 3 GRADE: 0 %
CHL CUP STRESS STAGE 3 HR: 122 {beats}/min
CHL CUP STRESS STAGE 5 GRADE: 6.4 %
CHL CUP STRESS STAGE 5 SPEED: 1.7 mph
CHL CUP STRESS STAGE 6 DBP: 93 mmHg
CHL CUP STRESS STAGE 6 GRADE: 0 %
CHL CUP STRESS STAGE 6 HR: 113 {beats}/min
CHL CUP STRESS STAGE 6 SBP: 172 mmHg
CHL CUP STRESS STAGE 7 DBP: 96 mmHg
CHL CUP STRESS STAGE 7 GRADE: 0 %
CHL RATE OF PERCEIVED EXERTION: 14
CSEPEDS: 9 s
CSEPEW: 1.8 METS
CSEPHR: 82 %
CSEPPMHR: 80 %
MPHR: 153 {beats}/min
Peak HR: 123 {beats}/min
Rest HR: 94 {beats}/min
Stage 1 DBP: 99 mmHg
Stage 1 HR: 100 {beats}/min
Stage 1 SBP: 147 mmHg
Stage 1 Speed: 0 mph
Stage 2 HR: 99 {beats}/min
Stage 3 Speed: 1 mph
Stage 4 Grade: 0 %
Stage 4 HR: 122 {beats}/min
Stage 4 Speed: 1 mph
Stage 5 HR: 123 {beats}/min
Stage 6 Speed: 0 mph
Stage 7 HR: 99 {beats}/min
Stage 7 SBP: 181 mmHg
Stage 7 Speed: 0 mph

## 2016-07-10 DIAGNOSIS — R0902 Hypoxemia: Secondary | ICD-10-CM | POA: Diagnosis not present

## 2016-07-10 DIAGNOSIS — G4733 Obstructive sleep apnea (adult) (pediatric): Secondary | ICD-10-CM | POA: Diagnosis not present

## 2016-07-13 ENCOUNTER — Other Ambulatory Visit: Payer: Self-pay | Admitting: Endocrinology

## 2016-07-19 ENCOUNTER — Encounter (HOSPITAL_COMMUNITY): Payer: Self-pay | Admitting: Emergency Medicine

## 2016-07-19 ENCOUNTER — Emergency Department (HOSPITAL_BASED_OUTPATIENT_CLINIC_OR_DEPARTMENT_OTHER)
Admit: 2016-07-19 | Discharge: 2016-07-19 | Disposition: A | Discharge status: Home / Self Care | Payer: Medicare Other | Attending: Emergency Medicine | Admitting: Emergency Medicine

## 2016-07-19 ENCOUNTER — Emergency Department (HOSPITAL_COMMUNITY)
Admission: EM | Admit: 2016-07-19 | Discharge: 2016-07-19 | Disposition: A | Discharge status: Home / Self Care | Payer: Medicare Other | Attending: Emergency Medicine | Admitting: Emergency Medicine

## 2016-07-19 ENCOUNTER — Other Ambulatory Visit: Payer: Self-pay

## 2016-07-19 ENCOUNTER — Emergency Department (HOSPITAL_COMMUNITY): Payer: Medicare Other

## 2016-07-19 DIAGNOSIS — J45909 Unspecified asthma, uncomplicated: Secondary | ICD-10-CM | POA: Diagnosis not present

## 2016-07-19 DIAGNOSIS — Z794 Long term (current) use of insulin: Secondary | ICD-10-CM | POA: Insufficient documentation

## 2016-07-19 DIAGNOSIS — Z96653 Presence of artificial knee joint, bilateral: Secondary | ICD-10-CM | POA: Diagnosis not present

## 2016-07-19 DIAGNOSIS — M79605 Pain in left leg: Secondary | ICD-10-CM | POA: Insufficient documentation

## 2016-07-19 DIAGNOSIS — E1149 Type 2 diabetes mellitus with other diabetic neurological complication: Secondary | ICD-10-CM | POA: Insufficient documentation

## 2016-07-19 DIAGNOSIS — Z87891 Personal history of nicotine dependence: Secondary | ICD-10-CM | POA: Diagnosis not present

## 2016-07-19 DIAGNOSIS — M79662 Pain in left lower leg: Secondary | ICD-10-CM | POA: Diagnosis not present

## 2016-07-19 DIAGNOSIS — Z79899 Other long term (current) drug therapy: Secondary | ICD-10-CM | POA: Diagnosis not present

## 2016-07-19 DIAGNOSIS — Z7982 Long term (current) use of aspirin: Secondary | ICD-10-CM | POA: Insufficient documentation

## 2016-07-19 DIAGNOSIS — M79609 Pain in unspecified limb: Secondary | ICD-10-CM | POA: Diagnosis not present

## 2016-07-19 DIAGNOSIS — R079 Chest pain, unspecified: Secondary | ICD-10-CM | POA: Diagnosis not present

## 2016-07-19 DIAGNOSIS — I10 Essential (primary) hypertension: Secondary | ICD-10-CM | POA: Diagnosis not present

## 2016-07-19 LAB — CBC WITH DIFFERENTIAL/PLATELET
BASOS ABS: 0 10*3/uL (ref 0.0–0.1)
Basophils Relative: 0 %
EOS PCT: 0 %
Eosinophils Absolute: 0 10*3/uL (ref 0.0–0.7)
HCT: 36 % (ref 36.0–46.0)
HEMOGLOBIN: 12.3 g/dL (ref 12.0–15.0)
LYMPHS ABS: 1.3 10*3/uL (ref 0.7–4.0)
Lymphocytes Relative: 26 %
MCH: 28.3 pg (ref 26.0–34.0)
MCHC: 34.2 g/dL (ref 30.0–36.0)
MCV: 82.9 fL (ref 78.0–100.0)
Monocytes Absolute: 0.4 10*3/uL (ref 0.1–1.0)
Monocytes Relative: 8 %
NEUTROS PCT: 66 %
Neutro Abs: 3.3 10*3/uL (ref 1.7–7.7)
PLATELETS: 279 10*3/uL (ref 150–400)
RBC: 4.34 MIL/uL (ref 3.87–5.11)
RDW: 14 % (ref 11.5–15.5)
WBC: 5.1 10*3/uL (ref 4.0–10.5)

## 2016-07-19 LAB — COMPREHENSIVE METABOLIC PANEL
ALK PHOS: 84 U/L (ref 38–126)
ALT: 25 U/L (ref 14–54)
AST: 22 U/L (ref 15–41)
Albumin: 3.8 g/dL (ref 3.5–5.0)
Anion gap: 11 (ref 5–15)
BUN: 20 mg/dL (ref 6–20)
CALCIUM: 9.5 mg/dL (ref 8.9–10.3)
CHLORIDE: 104 mmol/L (ref 101–111)
CO2: 24 mmol/L (ref 22–32)
CREATININE: 1.07 mg/dL — AB (ref 0.44–1.00)
GFR, EST NON AFRICAN AMERICAN: 52 mL/min — AB (ref >60)
Glucose, Bld: 191 mg/dL — ABNORMAL HIGH (ref 65–99)
Potassium: 3.7 mmol/L (ref 3.5–5.1)
SODIUM: 139 mmol/L (ref 135–145)
Total Bilirubin: 0.5 mg/dL (ref 0.3–1.2)
Total Protein: 8 g/dL (ref 6.5–8.1)

## 2016-07-19 LAB — I-STAT TROPONIN, ED
Troponin i, poc: 0 ng/mL (ref 0.00–0.08)
Troponin i, poc: 0 ng/mL (ref 0.00–0.08)

## 2016-07-19 MED ORDER — ASPIRIN EC 325 MG PO TBEC
325.0000 mg | DELAYED_RELEASE_TABLET | Freq: Once | ORAL | Status: AC
  Administered 2016-07-19: 325 mg via ORAL
  Filled 2016-07-19: qty 1

## 2016-07-19 NOTE — ED Provider Notes (Signed)
[facility] DEPT Provider Note   CSN: [id number] Arrival date & time: 07/19/16  1506     History   Chief Complaint Chief Complaint  Patient presents with  . Leg Pain    HPI [name] is a 66 y.o. female.  HPI  66 year old female with history of HTN, HLD, DM 2 on insulin, who presents for evaluation of left lower extremity pain. Patient describes 2 separate pains, one in her left lateral calf that is described as a burning pins and needle sensation, and a second pain that occurs with walking in her entire lower leg that is described as a squeezing, cramping sensation, that worsens with prolonged ambulation. Also endorses some occasional tingling and decreased sensation to the bilateral hands. She's never been diagnosed with peripheral neuropathy. Patient also is concerned about some substernal chest pressure that has been occurring daily for the last couple of weeks. Does not radiate. Not accompanied by nausea, vomiting, diaphoresis. She has not noticed that it is exertional or pleuritic. Denies history of coronary artery disease. She had an outpatient stress test performed on 07/01/16 where she did not reach adequate heart rate.  She denies swelling to her left lower extremity. Endorses chronic back pain but nothing new and denies trauma to her back or lower extremity. Denies fevers or chills.  Past Medical History:  Diagnosis Date  . Anterolisthesis    Grade 1, L4-5  . Bronchospasm 05/28/2012  . CHEST PAIN 11/18/2007  . DEGENERATIVE JOINT DISEASE 10/06/2006  . DEPRESSION 09/26/2008  . DIABETES MELLITUS 10/06/2006  . Diverticulosis    0263,7858  . GERD (gastroesophageal reflux disease) 07/25/2013  . HYPERLIPIDEMIA 01/11/2009  . HYPERTENSION 10/06/2006  . INSOMNIA 09/26/2008  . Internal hemorrhoids   . OBSTRUCTIVE SLEEP APNEA 06/23/2008   Severe OSA per sleep study 2010, Rx a CPAP  . Pain in joint, multiple sites 11/10/2006  . UNSPECIFIED ANEMIA 12/10/2009    Patient Active  Problem List   Diagnosis Date Noted  . PCP NOTES >>> 02/23/2015  . Nocturnal oxygen desaturation 01/02/2015  . Morbid obesity ([place]) 01/02/2015  . Asthma, chronic 01/02/2015  . GERD (gastroesophageal reflux disease) 07/25/2013  . DOE (dyspnea on exertion) 04/25/2013  . 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 Bronchospasm 05/28/2012  . Internal hemorrhoids without mention of complication 85/06/7739  . Annual physical exam 06/06/2011  . SKIN LESION 08/06/2009  . Hyperlipidemia 01/11/2009  . Depression 09/26/2008  . INSOMNIA 09/26/2008  . Obstructive sleep apnea 06/23/2008  . Chest pain 11/18/2007  . DM II (diabetes mellitus, type II), controlled ([place]) 10/06/2006  . Essential hypertension 10/06/2006  . Osteoarthritis 10/06/2006    Past Surgical History:  Procedure Laterality Date  . COLONOSCOPY  08/01/2011   Procedure: COLONOSCOPY;  Surgeon: Inda Castle, MD;  Location: [facility];  Service: Endoscopy;  Laterality: N/A;  . 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 Right knee replacement  2005    OB History    No data available       Home Medications    Prior to Admission medications   Medication Sig Start Date End Date Taking? Authorizing Provider  ACCU-CHEK AVIVA PLUS test strip TEST twice a day as directed 03/18/16   Renato Shin, MD  ACCU-CHEK SOFTCLIX LANCETS lancets USE TO CHECK BLOOD SUGAR 2 TIMES DAILY 07/13/16   Renato Shin, MD  albuterol (PROVENTIL HFA;VENTOLIN HFA) 108 (90 BASE) MCG/ACT inhaler Inhale 2 puffs into the lungs every 4 (four) hours as needed  for wheezing. 06/01/14 03/26/16  Colon Branch, MD  aspirin 325 MG tablet Take 325 mg by mouth daily.      Historical Provider, MD  augmented betamethasone dipropionate (DIPROLENE-AF) 0.05 % cream Apply topically 2 (two) times daily. 03/26/16   Colon Branch, MD  BD ULTRA-FINE PEN NEEDLES 29G X 12.7MM MISC USE AS DIRECTED 3 TIMES A DAY 06/01/16   Renato Shin, MD  Blood Glucose Monitoring  Suppl (ACCU-CHEK AVIVA PLUS) W/DEVICE KIT 1 Device by Does not apply route once. 06/20/11   Renato Shin, MD  budesonide-formoterol (SYMBICORT) 160-4.5 MCG/ACT inhaler Inhale 2 puffs into the lungs 2 (two) times daily. 09/01/14   Colon Branch, MD  cyclobenzaprine (FLEXERIL) 5 MG tablet Take 5-10 mg by mouth at bedtime as needed for muscle spasms.    Historical Provider, MD  Diclofenac Sodium 2 % SOLN Place 1 packet onto the skin 2 (two) times daily. Reported on 06/18/2015    Historical Provider, MD  diltiazem (TIAZAC) 360 MG 24 hr capsule Take 1 capsule (360 mg total) by mouth daily. 01/28/16   Colon Branch, MD  Insulin Lispro Prot & Lispro (HUMALOG MIX 50/50 KWIKPEN) (50-50) 100 UNIT/ML Kwikpen Inject 45 Units into the skin 2 (two) times daily with a meal. And pen needles 2/day 05/27/16   Renato Shin, MD  potassium chloride (K-DUR,KLOR-CON) 10 MEQ tablet Take 1 tablet (10 mEq total) by mouth daily. 01/28/16   Colon Branch, MD  rosuvastatin (CRESTOR) 40 MG tablet Take 1 tablet (40 mg total) by mouth daily. 01/07/16   Colon Branch, MD  telmisartan (MICARDIS) 40 MG tablet Take 1 tablet (40 mg total) by mouth daily. 06/09/16   Colon Branch, MD    Family History Family History  Problem Relation Age of Onset  . Asthma Mother   . Stroke Mother   . Diabetes Other     M, B, S  . Hypertension Sister     M, S,B  . Pancreatic cancer Brother   . Colon cancer Neg Hx   . Prostate cancer Neg Hx   . Breast cancer Neg Hx     Social History Social History  Substance Use Topics  . Smoking status: Former Smoker    Packs/day: 0.20    Years: 4.00    Types: Cigarettes    Quit date: 05/13/1995  . Smokeless tobacco: Never Used  . Alcohol use Yes     Comment: Rarely     Allergies   Patient has no known allergies.   Review of Systems Review of Systems  Constitutional: Negative for activity change, appetite change, chills and fever.  HENT: Negative for congestion.   Eyes: Negative for visual disturbance.    Respiratory: Negative for cough, shortness of breath and wheezing.   Cardiovascular: Positive for chest pain (not present currently). Negative for palpitations and leg swelling.  Gastrointestinal: Negative for abdominal distention, abdominal pain, diarrhea, nausea and vomiting.  Genitourinary: Negative for dysuria, flank pain and frequency.  Musculoskeletal: Positive for back pain (chronic, unchanged) and myalgias (L leg). Negative for arthralgias, gait problem, joint swelling, neck pain and neck stiffness.  Skin: Negative for rash.  Neurological: Negative for dizziness, tremors, syncope, facial asymmetry, speech difficulty, weakness, numbness and headaches.  Psychiatric/Behavioral: Negative for agitation, behavioral problems and confusion.     Physical Exam Updated Vital Signs BP 172/91   Pulse 92   Temp 98.6 F (37 C) (Oral)   Resp 20   SpO2 96%  Physical Exam  Constitutional: She is oriented to person, place, and time. She appears well-developed and well-nourished. No distress.  HENT:  Head: Normocephalic and atraumatic.  Right Ear: External ear normal.  Left Ear: External ear normal.  Nose: Nose normal.  Mouth/Throat: Oropharynx is clear and moist. No oropharyngeal exudate.  Eyes: Conjunctivae and EOM are normal. Pupils are equal, round, and reactive to light. Right eye exhibits no discharge. Left eye exhibits no discharge.  Neck: Normal range of motion. Neck supple.  Cardiovascular: Normal rate, regular rhythm, normal heart sounds and intact distal pulses.  Exam reveals no gallop and no friction rub.   No murmur heard. Pulmonary/Chest: Effort normal and breath sounds normal. No respiratory distress. She has no wheezes. She has no rales.  Abdominal: Soft. Bowel sounds are normal. She exhibits no distension. There is no tenderness.  Musculoskeletal: Normal range of motion. She exhibits no edema (No calf swelling) or tenderness (No TTP of the LLE).  No midline TTP over the T  or L spine  Neurological: She is alert and oriented to person, place, and time. She exhibits normal muscle tone.  Intact sensation to light touch in the the LLE  Skin: Skin is warm and dry. No rash noted. She is not diaphoretic.  Psychiatric: She has a normal mood and affect. Her behavior is normal. Judgment and thought content normal.     ED Treatments / Results  Labs (all labs ordered are listed, but only abnormal results are displayed) Labs Reviewed  COMPREHENSIVE METABOLIC PANEL - Abnormal; Notable for the following:       Result Value   Glucose, Bld 191 (*)    Creatinine, Ser 1.07 (*)    GFR calc non Af Amer 52 (*)    All other components within normal limits  CBC WITH DIFFERENTIAL/PLATELET  I-STAT TROPOININ, ED  I-STAT TROPOININ, ED  Randolm Idol, ED    EKG  EKG Interpretation None       Radiology Dg Chest 2 View  Result Date: 07/19/2016 CLINICAL DATA:  Chest pain EXAM: CHEST  2 VIEW COMPARISON:  10/10/2015 FINDINGS: Lungs are clear.  No pleural effusion or pneumothorax. The heart is top-normal in size. Degenerative changes of the visualized thoracolumbar spine. IMPRESSION: No evidence of acute cardiopulmonary disease. Electronically Signed   By: Julian Hy M.D.   On: 07/19/2016 19:45    Procedures Procedures (including critical care time)  Medications Ordered in ED Medications  aspirin EC tablet 325 mg (325 mg Oral Given 07/19/16 1820)     Initial Impression / Assessment and Plan / ED Course  I have reviewed the triage vital signs and the nursing notes.  Pertinent labs & imaging results that were available during my care of the patient were reviewed by me and considered in my medical decision making (see chart for details).     Generally well-appearing. Afebrile and hemodynamically stable. Physical exam of the left lower extremity is unremarkable, with no swelling, calf tenderness, and good distal pulses. Intact sensation to light touch. Patient  does have an area of old ecchymosis to the left lateral calf from when she hit her leg against a table a few weeks ago but no hematoma and compartments are soft. Description of patient's symptoms is consistent with peripheral nephropathy. Pain worsening with ambulation could be secondary to claudication, however patient has good distal pulses and no physical findings consistent with decreased arterial flow.  Patient's EKG is unchanged from prior and she is chest pain-free here. Troponin  2 are 0.00. Chest x-ray with normal mediastinum and no cardio pulmonary abnormalities. Patient's case was discussed with Dr. Radford Pax with cardiology, who will be contacting the patient for outpatient follow-up in clinic as her recent stress test did not meet target heart rate. Patient discharged with strict return precautions for recurrent chest pain, shortness of breath, new or concerning symptoms. She expressed agreement and understanding with this plan and was discharged in good condition.  Care of pt overseen by my attending, Dr. Sabra Heck.  Final Clinical Impressions(s) / ED Diagnoses   Final diagnoses:  Left leg pain    New Prescriptions Discharge Medication List as of 07/19/2016  9:35 PM       Jenifer Algernon Huxley, MD 07/20/16 5790    Noemi Chapel, MD 07/20/16 1039

## 2016-07-19 NOTE — ED Provider Notes (Signed)
I saw and evaluated the patient, reviewed the resident's note and I agree with the findings and plan.  Pertinent History: The patient is a 66 year old female, she is very obese, she complains of chronic leg pain but has had worsening leg pain on the left lower extremity on the lateral surface where she struck her leg on a coffee table a couple months ago. She reports intermittent pain since that time but states that today she felt like she had a moving around sensation in her leg almost a stinging sensation.  She has had intermittent chest pain for quite some time, had a recent stress test which did not show overt ischemia but was not completed. She does not have any chest pain at this time and has no couplets of chest pain today.   Pertinent Exam findings: On exam the patient has clear heart and lung sounds, no murmur, she does have a small amount of bruising to the left lateral leg which appears old but there is no asymmetry, no edema, normal pulses at the feet at the dorsalis pedis and normal range of motion of all major joints. The compartments are very soft, joints are very supple.  Doubt cardiac etiology, this seems more likely to be muscular skeletal or neuropathy. She has good pulses and no compromise blood flow on exam. We'll need to have discussed with cardiology regarding follow-up for her chest pain however at this time she has no objective evidence of ischemia. Ultrasound performed here for DVT is negative.  ED ECG REPORT  I personally interpreted this EKG   Date: 07/20/2016   Rate: 84  Rhythm: normal sinus rhythm  QRS Axis: normal  Intervals: normal  ST/T Wave abnormalities: nonspecific ST changes  Conduction Disutrbances:none  Narrative Interpretation: mild ST abnormalities unchanged over last 10 months  Old EKG Reviewed: c/w 10/10/15, no changes   I personally interpreted the EKG as well as the resident and agree with the interpretation on the resident's chart.  Final diagnoses:   Left leg pain      Noemi Chapel, MD 07/20/16 1039

## 2016-07-19 NOTE — ED Notes (Signed)
Patient transported to X-ray 

## 2016-07-19 NOTE — ED Triage Notes (Signed)
Pt states she has been having some left lower leg pain for several months. "I feel like something is jumping around in my leg. I'm worried I have a blood clot." No hx of blood clots. Pt states she has some pain at times when walking. Leg appears ever so slightly larger than right lower leg.

## 2016-07-19 NOTE — ED Notes (Signed)
Obtained verbal order for ultrasound of left lower leg to rule out DVT

## 2016-07-19 NOTE — ED Notes (Signed)
Pt reports ongoing CP to EDP.

## 2016-07-19 NOTE — ED Notes (Signed)
Pt ambulated to room without difficulty

## 2016-07-19 NOTE — Progress Notes (Signed)
VASCULAR LAB PRELIMINARY  PRELIMINARY  PRELIMINARY  PRELIMINARY  Left lower extremity venous duplex completed.    Preliminary report:  There is no DVT or SVT noted in the left lower extremity.  Gave report to Dr. Carney Bern, [facility], RVT 07/19/2016, 5:27 PM

## 2016-07-21 ENCOUNTER — Encounter: Payer: Self-pay | Admitting: Physician Assistant

## 2016-07-21 NOTE — Progress Notes (Signed)
I am covering Trish Trent's inbasket today. Received message from Dr. Radford Pax requesting follow-up appt. I have sent a message to our [facility] office's scheduler requesting a follow-up appointment, and our office will call the patient with this information.  Lessie Funderburke PA-C

## 2016-07-23 ENCOUNTER — Other Ambulatory Visit: Payer: Self-pay

## 2016-07-23 MED ORDER — ROSUVASTATIN CALCIUM 40 MG PO TABS: 40.0000 mg | ORAL_TABLET | Freq: Every day | ORAL | 1 refills | Status: DC

## 2016-07-25 ENCOUNTER — Ambulatory Visit (INDEPENDENT_AMBULATORY_CARE_PROVIDER_SITE_OTHER): Payer: Medicare Other | Admitting: Endocrinology

## 2016-07-25 VITALS — BP 158/88 | HR 103 | Wt 314.0 lb

## 2016-07-25 DIAGNOSIS — E119 Type 2 diabetes mellitus without complications: Secondary | ICD-10-CM

## 2016-07-25 LAB — POCT GLYCOSYLATED HEMOGLOBIN (HGB A1C): Hemoglobin A1C: 9.5

## 2016-07-25 MED ORDER — INSULIN LISPRO PROT & LISPRO (50-50 MIX) 100 UNIT/ML KWIKPEN: 50.0000 [IU] | PEN_INJECTOR | Freq: Two times a day (BID) | SUBCUTANEOUS | 11 refills | Status: DC

## 2016-07-25 NOTE — Progress Notes (Signed)
Subjective:    Patient ID: [name], female    DOB: 02-Oct-1950, 66 y.o.   MRN: [id number]  HPI Pt returns for f/u of diabetes mellitus: DM type: Insulin-requiring type 2 Dx'ed: 1914 Complications: polyneuropathy Therapy: insulin since 2004 GDM: never DKA: never Severe hypoglycemia: never.  Pancreatitis: never.   Other: she declines weight-loss surgery; she did not achieve good glycemic control with multiple daily injections, but she has done better with a BID insulin regimen.  She has takes less insulin on Sunday mornings, due to risk hypoglycemia then.   Interval history: she brings a record of her cbg's which I have reviewed today.  It varies from 100-200's.  There is no trend throughout the day. She says she never misses the insulin.  No recent steroids.  She did not increase insulin at last ov, as advised. Past Medical History:  Diagnosis Date  . Anterolisthesis    Grade 1, L4-5  . Bronchospasm 05/28/2012  . CHEST PAIN 11/18/2007  . DEGENERATIVE JOINT DISEASE 10/06/2006  . DEPRESSION 09/26/2008  . DIABETES MELLITUS 10/06/2006  . Diverticulosis    [id number]  . GERD (gastroesophageal reflux disease) 07/25/2013  . HYPERLIPIDEMIA 01/11/2009  . HYPERTENSION 10/06/2006  . INSOMNIA 09/26/2008  . Internal hemorrhoids   . OBSTRUCTIVE SLEEP APNEA 06/23/2008   Severe OSA per sleep study 2010, Rx a CPAP  . Pain in joint, multiple sites 11/10/2006  . UNSPECIFIED ANEMIA 12/10/2009    Past Surgical History:  Procedure Laterality Date  . COLONOSCOPY  08/01/2011   Procedure: COLONOSCOPY;  Surgeon: Melissa Castle, MD;  Location: [facility];  Service: Endoscopy;  Laterality: N/A;  . 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 Right knee replacement  2005    Social History   Social History  . Marital status: Widowed    Spouse name: N/A  . Number of children: 4  . Years of education: N/A   Occupational History  . disability Unemployed   Social History Main Topics  . Smoking status: Former  Smoker    Packs/day: 0.20    Years: 4.00    Types: Cigarettes    Quit date: 05/13/1995  . Smokeless tobacco: Never Used  . Alcohol use Yes     Comment: Rarely  . Drug use: No  . Sexual activity: Not on file   Other Topics Concern  . Not on file   Social History Narrative   Widow , lives by herself   Lost a son, 3 living     Current Outpatient Prescriptions on File Prior to Visit  Medication Sig Dispense Refill  . ACCU-CHEK AVIVA PLUS test strip TEST twice a day as directed 100 each 2  . ACCU-CHEK SOFTCLIX LANCETS lancets USE TO CHECK BLOOD SUGAR 2 TIMES DAILY 100 each 12  . albuterol (PROVENTIL HFA;VENTOLIN HFA) 108 (90 BASE) MCG/ACT inhaler Inhale 2 puffs into the lungs every 4 (four) hours as needed for wheezing. 1 Inhaler 6  . aspirin 325 MG tablet Take 325 mg by mouth daily.      Marland Kitchen augmented betamethasone dipropionate (DIPROLENE-AF) 0.05 % cream Apply topically 2 (two) times daily. 60 g 0  . BD ULTRA-FINE PEN NEEDLES 29G X 12.7MM MISC USE AS DIRECTED 3 TIMES A DAY 100 each 3  . Blood Glucose Monitoring Suppl (ACCU-CHEK AVIVA PLUS) W/DEVICE KIT 1 Device by Does not apply route once. 1 kit 0  . budesonide-formoterol (SYMBICORT) 160-4.5 MCG/ACT inhaler Inhale 2 puffs into the lungs 2 (two) times daily. 10.2  g 5  . cyclobenzaprine (FLEXERIL) 5 MG tablet Take 5-10 mg by mouth at bedtime as needed for muscle spasms.    . Diclofenac Sodium 2 % SOLN Place 1 packet onto the skin 2 (two) times daily. Reported on 06/18/2015    . diltiazem (TIAZAC) 360 MG 24 hr capsule Take 1 capsule (360 mg total) by mouth daily. 90 capsule 1  . potassium chloride (K-DUR,KLOR-CON) 10 MEQ tablet Take 1 tablet (10 mEq total) by mouth daily. 90 tablet 1  . rosuvastatin (CRESTOR) 40 MG tablet Take 1 tablet (40 mg total) by mouth daily. 90 tablet 1  . telmisartan (MICARDIS) 40 MG tablet Take 1 tablet (40 mg total) by mouth daily. 90 tablet 2   No current facility-administered medications on file prior to visit.       No Known Allergies  Family History  Problem Relation Age of Onset  . Asthma Mother   . Stroke Mother   . Diabetes Other     M, B, S  . Hypertension Sister     M, S,B  . Pancreatic cancer Brother   . Colon cancer Neg Hx   . Prostate cancer Neg Hx   . Breast cancer Neg Hx     BP (!) 158/88 (BP Location: Left Arm, Patient Position: Sitting, Cuff Size: Normal)   Pulse (!) 103   Wt (!) 314 lb (142.4 kg)   SpO2 99%   BMI 46.37 kg/m   Review of Systems She denies hypoglycemia.     Objective:   Physical Exam VITAL SIGNS:  See vs page GENERAL: no distress Pulses: dorsalis pedis intact bilat.  MSK: no deformity of the feet.  CV: 1+ bilat leg edema, and bilat vv's Skin: no ulcer on the feet. normal color and temp on the feet.  Neuro: sensation is intact to touch on the feet.  Ext: There is bilateral onychomycosis of the toenails.    a1c=9.5%    Assessment & Plan:  Insulin-requiring type 2 DM, with polyneuropathy: worse Noncompliance with cbg recording and insulin.  Patient is advised the following: Patient Instructions  Please make a follow-up appointment in 2 months.   On this type of insulin schedule, you should eat meals on a regular schedule.  If a meal is missed or significantly delayed, your blood sugar could go low. check your blood sugar 2 times a day.  vary the time of day when you check, between before the 3 meals, and at bedtime.  also check if you have symptoms of your blood sugar being too high or too low.  please keep a record of the readings and bring it to your next appointment here.  please call us sooner if your blood sugar goes below 70, or if it stays over 200.   Please increase the insulin to 50 units twice a day (just before the first and last meals of the day).  However, on Sunday mornings, take just 10 units.

## 2016-07-25 NOTE — Patient Instructions (Addendum)
Please make a follow-up appointment in 2 months.   On this type of insulin schedule, you should eat meals on a regular schedule.  If a meal is missed or significantly delayed, your blood sugar could go low. check your blood sugar 2 times a day.  vary the time of day when you check, between before the 3 meals, and at bedtime.  also check if you have symptoms of your blood sugar being too high or too low.  please keep a record of the readings and bring it to your next appointment here.  please call us sooner if your blood sugar goes below 70, or if it stays over 200.   Please increase the insulin to 50 units twice a day (just before the first and last meals of the day).  However, on Sunday mornings, take just 10 units.

## 2016-07-29 ENCOUNTER — Other Ambulatory Visit: Payer: Self-pay | Admitting: Internal Medicine

## 2016-08-10 DIAGNOSIS — R0902 Hypoxemia: Secondary | ICD-10-CM | POA: Diagnosis not present

## 2016-08-10 DIAGNOSIS — G4733 Obstructive sleep apnea (adult) (pediatric): Secondary | ICD-10-CM | POA: Diagnosis not present

## 2016-08-11 ENCOUNTER — Encounter: Payer: Self-pay | Admitting: Cardiology

## 2016-08-12 NOTE — Progress Notes (Signed)
Pt unable to complete test.

## 2016-08-14 ENCOUNTER — Other Ambulatory Visit: Payer: Self-pay | Admitting: Endocrinology

## 2016-08-21 DIAGNOSIS — E1351 Other specified diabetes mellitus with diabetic peripheral angiopathy without gangrene: Secondary | ICD-10-CM | POA: Diagnosis not present

## 2016-08-21 DIAGNOSIS — L602 Onychogryphosis: Secondary | ICD-10-CM | POA: Diagnosis not present

## 2016-08-21 DIAGNOSIS — B351 Tinea unguium: Secondary | ICD-10-CM | POA: Diagnosis not present

## 2016-08-21 DIAGNOSIS — I70293 Other atherosclerosis of native arteries of extremities, bilateral legs: Secondary | ICD-10-CM | POA: Diagnosis not present

## 2016-08-26 ENCOUNTER — Encounter: Payer: Self-pay | Admitting: Cardiology

## 2016-08-26 ENCOUNTER — Ambulatory Visit (INDEPENDENT_AMBULATORY_CARE_PROVIDER_SITE_OTHER): Payer: Medicare Other | Admitting: Cardiology

## 2016-08-26 VITALS — BP 156/102 | HR 100 | Ht 69.0 in | Wt 310.4 lb

## 2016-08-26 DIAGNOSIS — I10 Essential (primary) hypertension: Secondary | ICD-10-CM | POA: Diagnosis not present

## 2016-08-26 DIAGNOSIS — E7800 Pure hypercholesterolemia, unspecified: Secondary | ICD-10-CM

## 2016-08-26 DIAGNOSIS — R0609 Other forms of dyspnea: Secondary | ICD-10-CM | POA: Diagnosis not present

## 2016-08-26 DIAGNOSIS — R079 Chest pain, unspecified: Secondary | ICD-10-CM

## 2016-08-26 NOTE — Patient Instructions (Signed)
Medication Instructions:  Your physician recommends that you continue on your current medications as directed. Please refer to the Current Medication list given to you today.   Labwork: None  Testing/Procedures: Your physician has requested that you have an echocardiogram. Echocardiography is a painless test that uses sound waves to create images of your heart. It provides your doctor with information about the size and shape of your heart and how well your heart's chambers and valves are working. This procedure takes approximately one hour. There are no restrictions for this procedure.   Your physician has requested that you have a lexiscan myoview. For further information please visit [email or web address]. Please follow instruction sheet, as given.  Follow-Up: Your physician recommends that you schedule a follow-up appointment AS NEEDED with Dr. Radford Pax pending study results.  Any Other Special Instructions Will Be Listed Below (If Applicable).     If you need a refill on your cardiac medications before your next appointment, please call your pharmacy.

## 2016-08-26 NOTE — Progress Notes (Signed)
Cardiology Office Note    Date:  08/26/2016   ID:  [name], DOB 1950/12/24, MRN [id number]  PCP:  Kathlene November, MD  Cardiologist:  Fransico Him, MD   Chief Complaint  Patient presents with  . New Patient (Initial Visit)    chest pain    History of Present Illness:  [name] is a 66 y.o. female with a history of DM, hyperlipidemia, HTN who was referred by her PCP, Dr. Kathlene November, MD for evaluation of chest pain.  She was seen in the ER in March with complaints of leg pain and mentioned that she had been having intermittent chest pain for some time. She had a stress test at her PCP office which was inconclusive as she did not reach her target HR due to inability to walk on the treadmill.  She is now referred for further evaluation.  She has continued to have episodes of chest pain that she describes as a sharp located under her right breast but sometimes goes into her back.  She still has her gallbladder.  The pain is worse with deep breathing and movement.  She gets SOB with the pain and sometimes gets diaphoretic with it.  It usually lasts a few minutes and then resolves when she sits down and rests.  It is worse with exertion.  She has a remote history of tobacco use.  She denies any family history of CAD.  She has problems with DOE and also gets SOB in bed and has to sit up.  She has OSA and uses her CPAP nightly.  She denies any palpitations, dizziness or syncope.  She denies any LE edema.      Past Medical History:  Diagnosis Date  . Anterolisthesis    Grade 1, L4-5  . Bronchospasm 05/28/2012  . CHEST PAIN 11/18/2007  . DEGENERATIVE JOINT DISEASE 10/06/2006  . DEPRESSION 09/26/2008  . DIABETES MELLITUS 10/06/2006  . Diverticulosis    5188,4166  . GERD (gastroesophageal reflux disease) 07/25/2013  . HYPERLIPIDEMIA 01/11/2009  . HYPERTENSION 10/06/2006  . INSOMNIA 09/26/2008  . Internal hemorrhoids   . OBSTRUCTIVE SLEEP APNEA 06/23/2008   Severe OSA per sleep study 2010, Rx  a CPAP  . Pain in joint, multiple sites 11/10/2006  . UNSPECIFIED ANEMIA 12/10/2009    Past Surgical History:  Procedure Laterality Date  . COLONOSCOPY  08/01/2011   Procedure: COLONOSCOPY;  Surgeon: Inda Castle, MD;  Location: [facility];  Service: Endoscopy;  Laterality: N/A;  . 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 Right knee replacement  2005    Current Medications: Current Meds  Medication Sig  . ACCU-CHEK AVIVA PLUS test strip TEST twice a day as directed  . ACCU-CHEK SOFTCLIX LANCETS lancets USE TO CHECK BLOOD SUGAR 2 TIMES DAILY  . aspirin 325 MG tablet Take 325 mg by mouth daily.    . BD ULTRA-FINE PEN NEEDLES 29G X 12.7MM MISC USE AS DIRECTED 3 TIMES A DAY  . Blood Glucose Monitoring Suppl (ACCU-CHEK AVIVA PLUS) W/DEVICE KIT 1 Device by Does not apply route once.  . budesonide-formoterol (SYMBICORT) 160-4.5 MCG/ACT inhaler Inhale 2 puffs into the lungs 2 (two) times daily.  Marland Kitchen diltiazem (TAZTIA XT) 360 MG 24 hr capsule Take 1 capsule (360 mg total) by mouth daily.  . Insulin Lispro Prot & Lispro (HUMALOG MIX 50/50 KWIKPEN) (50-50) 100 UNIT/ML Kwikpen Inject 50 Units into the skin 2 (two) times daily with a meal. And pen needles 2/day  .  potassium chloride (K-DUR,KLOR-CON) 10 MEQ tablet Take 1 tablet (10 mEq total) by mouth daily.  . rosuvastatin (CRESTOR) 40 MG tablet Take 1 tablet (40 mg total) by mouth daily.  Marland Kitchen telmisartan (MICARDIS) 40 MG tablet Take 1 tablet (40 mg total) by mouth daily.    Allergies:   Patient has no known allergies.   Social History   Social History  . Marital status: Widowed    Spouse name: N/A  . Number of children: 4  . Years of education: N/A   Occupational History  . disability Unemployed   Social History Main Topics  . Smoking status: Former Smoker    Packs/day: 0.20    Years: 4.00    Types: Cigarettes    Quit date: 05/13/1995  . Smokeless tobacco: Never Used  . Alcohol use Yes     Comment: Rarely  . Drug use: No  . Sexual activity:  Not Asked   Other Topics Concern  . None   Social History Narrative   Widow , lives by herself   Lost a son, 3 living      Family History:  The patient's family history includes Asthma in her mother; Diabetes in her other; Hypertension in her sister; Pancreatic cancer in her brother; Stroke in her mother.   ROS:   Please see the history of present illness.    ROS All other systems reviewed and are negative.  No flowsheet data found.     PHYSICAL EXAM:   VS:  BP (!) 156/102   Pulse 100   Ht '5\' 9"'$  (1.753 m)   Wt (!) 310 lb 6.4 oz (140.8 kg)   SpO2 99%   BMI 45.84 kg/m    GEN: Well nourished, well developed, in no acute distress  HEENT: normal  Neck: no JVD, carotid bruits, or masses Cardiac: RRR; no murmurs, rubs, or gallops,no edema.  Intact distal pulses bilaterally.  Respiratory:  clear to auscultation bilaterally, normal work of breathing GI: soft, nontender, nondistended, + BS MS: no deformity or atrophy  Skin: warm and dry, no rash Neuro:  Alert and Oriented x 3, Strength and sensation are intact Psych: euthymic mood, full affect  Wt Readings from Last 3 Encounters:  08/26/16 (!) 310 lb 6.4 oz (140.8 kg)  07/25/16 (!) 314 lb (142.4 kg)  05/27/16 (!) 313 lb (142 kg)      Studies/Labs Reviewed:   EKG:  EKG is not ordered today but EKG from 07/2016 was reviewed and demonstrated NSR with prolonged QTc.  Recent Labs: 03/26/2016: TSH 6.24 07/19/2016: ALT 25; BUN 20; Creatinine, Ser 1.07; Hemoglobin 12.3; Platelets 279; Potassium 3.7; Sodium 139   Lipid Panel    Component Value Date/Time   CHOL 197 03/26/2016 1609   TRIG 141.0 03/26/2016 1609   HDL 61.50 03/26/2016 1609   CHOLHDL 3 03/26/2016 1609   VLDL 28.2 03/26/2016 1609   LDLCALC 107 (H) 03/26/2016 1609    Additional studies/ records that were reviewed today include:  none    ASSESSMENT:    1. Chest pain, unspecified type   2. DOE (dyspnea on exertion)   3. Pure hypercholesterolemia   4.  Essential hypertension      PLAN:  In order of problems listed above:  1. Chest pain - her CP has typical and atypical components.  It occurs with exertion and resolves with rest but it also is sharp under her right breast.  It is associated with SOB.  She has a remote history of smoking  but no family history of CAD. Her CRFs include hyperlipidemia, morbid obesity and HTN.  I will get a lexiscan myoview to rule out ischemia.   2. DOE - likely due to morbid obesity and sedentary state.  I will check a 2D echo to assess LVF. 3. Hyperlipidemia - this is followed by her PCP. She will continue on Crestor. 4. HTN - BP elevated today but she is very anxious.  She will continue on ARB and Cardizem.     Medication Adjustments/Labs and Tests Ordered: Current medicines are reviewed at length with the patient today.  Concerns regarding medicines are outlined above.  Medication changes, Labs and Tests ordered today are listed in the Patient Instructions below.  There are no Patient Instructions on file for this visit.   Signed, Fransico Him, MD  08/26/2016 1:19 PM    [facility] [address], [place], [place]  [address] Phone: [phone]; Fax: [phone]

## 2016-09-04 ENCOUNTER — Telehealth (HOSPITAL_COMMUNITY): Payer: Self-pay | Admitting: *Deleted

## 2016-09-04 NOTE — Telephone Encounter (Signed)
Patient given detailed instructions per Myocardial Perfusion Study Information Sheet for the test on 09/09/16. Patient notified to arrive 15 minutes early and that it is imperative to arrive on time for appointment to keep from having the test rescheduled.  If you need to cancel or reschedule your appointment, please call the office within 24 hours of your appointment. Failure to do so may result in a cancellation of your appointment, and a $50 no show fee. Patient verbalized understanding.[name]    

## 2016-09-05 ENCOUNTER — Other Ambulatory Visit: Payer: Self-pay | Admitting: Internal Medicine

## 2016-09-05 DIAGNOSIS — Z1231 Encounter for screening mammogram for malignant neoplasm of breast: Secondary | ICD-10-CM

## 2016-09-09 ENCOUNTER — Ambulatory Visit (HOSPITAL_COMMUNITY): Payer: Medicare Other | Attending: Cardiology

## 2016-09-09 ENCOUNTER — Ambulatory Visit (HOSPITAL_BASED_OUTPATIENT_CLINIC_OR_DEPARTMENT_OTHER): Payer: Medicare Other

## 2016-09-09 ENCOUNTER — Other Ambulatory Visit: Payer: Self-pay

## 2016-09-09 DIAGNOSIS — Z6841 Body Mass Index (BMI) 40.0 and over, adult: Secondary | ICD-10-CM | POA: Insufficient documentation

## 2016-09-09 DIAGNOSIS — E119 Type 2 diabetes mellitus without complications: Secondary | ICD-10-CM | POA: Insufficient documentation

## 2016-09-09 DIAGNOSIS — I119 Hypertensive heart disease without heart failure: Secondary | ICD-10-CM | POA: Insufficient documentation

## 2016-09-09 DIAGNOSIS — R0609 Other forms of dyspnea: Secondary | ICD-10-CM | POA: Diagnosis not present

## 2016-09-09 DIAGNOSIS — R079 Chest pain, unspecified: Secondary | ICD-10-CM

## 2016-09-09 DIAGNOSIS — R0902 Hypoxemia: Secondary | ICD-10-CM | POA: Diagnosis not present

## 2016-09-09 DIAGNOSIS — R110 Nausea: Secondary | ICD-10-CM

## 2016-09-09 DIAGNOSIS — G4733 Obstructive sleep apnea (adult) (pediatric): Secondary | ICD-10-CM | POA: Diagnosis not present

## 2016-09-09 MED ORDER — REGADENOSON 0.4 MG/5ML IV SOLN
0.4000 mg | Freq: Once | INTRAVENOUS | Status: AC
  Administered 2016-09-09: 0.4 mg via INTRAVENOUS

## 2016-09-09 MED ORDER — AMINOPHYLLINE 25 MG/ML IV SOLN
75.0000 mg | Freq: Once | INTRAVENOUS | Status: AC
  Administered 2016-09-09: 75 mg via INTRAVENOUS

## 2016-09-09 MED ORDER — TECHNETIUM TC 99M TETROFOSMIN IV KIT
33.0000 | PACK | Freq: Once | INTRAVENOUS | Status: AC | PRN
  Administered 2016-09-09: 33 via INTRAVENOUS
  Filled 2016-09-09: qty 33

## 2016-09-10 ENCOUNTER — Ambulatory Visit (HOSPITAL_COMMUNITY): Payer: Medicare Other | Attending: Cardiology

## 2016-09-10 LAB — MYOCARDIAL PERFUSION IMAGING
CHL CUP NUCLEAR SDS: 5
CHL CUP NUCLEAR SRS: 0
CHL CUP NUCLEAR SSS: 5
LV dias vol: 100 mL (ref 46–106)
LVSYSVOL: 56 mL
NUC STRESS TID: 0.82
Peak HR: 98 {beats}/min
RATE: 0.26
Rest HR: 86 {beats}/min

## 2016-09-10 MED ORDER — TECHNETIUM TC 99M TETROFOSMIN IV KIT
33.0000 | PACK | Freq: Once | INTRAVENOUS | Status: AC | PRN
  Administered 2016-09-10: 33 via INTRAVENOUS
  Filled 2016-09-10: qty 33

## 2016-09-11 ENCOUNTER — Telehealth: Payer: Self-pay | Admitting: Cardiology

## 2016-09-11 NOTE — Telephone Encounter (Signed)
Informed patient of results and verbal understanding expressed.   Patient states she had an episode of CP "just like described to Dr. Radford Pax" yesterday afternoon during Bible Study. She reports it was a sharp pain that resolved when she "sat straight up." She said she had eaten a while before the episode but isn't sure it felt like indigestion. She is asymptomatic now. She completed myoview and results are pending.  She awaits call with those results soon.

## 2016-09-11 NOTE — Telephone Encounter (Signed)
-----   Message from Sueanne Margarita, MD sent at 09/09/2016  4:38 PM EDT ----- Echo showed normal LVF with moderate LVH and mild LAE

## 2016-09-11 NOTE — Telephone Encounter (Signed)
Follow Up:    Returning your call from yesterday,concerning her results. Please call before 10:00 if possible.

## 2016-09-23 ENCOUNTER — Encounter: Payer: Self-pay | Admitting: Internal Medicine

## 2016-09-23 ENCOUNTER — Ambulatory Visit (INDEPENDENT_AMBULATORY_CARE_PROVIDER_SITE_OTHER): Payer: Medicare Other | Admitting: Internal Medicine

## 2016-09-23 VITALS — BP 126/84 | HR 92 | Temp 98.4°F | Resp 14 | Ht 69.0 in | Wt 312.1 lb

## 2016-09-23 DIAGNOSIS — E7800 Pure hypercholesterolemia, unspecified: Secondary | ICD-10-CM | POA: Diagnosis not present

## 2016-09-23 DIAGNOSIS — R7989 Other specified abnormal findings of blood chemistry: Secondary | ICD-10-CM

## 2016-09-23 DIAGNOSIS — R946 Abnormal results of thyroid function studies: Secondary | ICD-10-CM | POA: Diagnosis not present

## 2016-09-23 LAB — TSH: TSH: 37.91 u[IU]/mL — ABNORMAL HIGH (ref 0.35–4.50)

## 2016-09-23 LAB — T4, FREE: Free T4: 0.47 ng/dL — ABNORMAL LOW (ref 0.60–1.60)

## 2016-09-23 LAB — LIPID PANEL
Cholesterol: 173 mg/dL (ref 0–200)
HDL: 50.9 mg/dL (ref >39.00)
LDL Cholesterol: 97 mg/dL (ref 0–99)
NONHDL: 122.17
Total CHOL/HDL Ratio: 3
Triglycerides: 126 mg/dL (ref 0.0–149.0)
VLDL: 25.2 mg/dL (ref 0.0–40.0)

## 2016-09-23 LAB — T3, FREE: T3, Free: 2.5 pg/mL (ref 2.3–4.2)

## 2016-09-23 NOTE — Progress Notes (Signed)
Pre visit review using our clinic review tool, if applicable. No additional management support is needed unless otherwise documented below in the visit note. 

## 2016-09-23 NOTE — Patient Instructions (Signed)
GO TO THE LAB : Get the blood work     GO TO THE FRONT DESK Schedule your next appointment for a   physical exam by November 2018  Also, consider see one of our nurses for Medicare wellness.

## 2016-09-24 NOTE — Assessment & Plan Note (Signed)
DM: Per endocrinology. HTN: Seems well-controlled, BMP last month satisfactory continue same medications Depression - insomnia: Well-controlled High cholesterol: On Crestor, last LDL not at goal, recheck a FLP Elevated TSH: Last TSH 03-2016 was slightly high, will recheck today. Visit took place when computers were down. RTC 03-2017, CPX, recommend Medicare wellness.

## 2016-09-24 NOTE — Progress Notes (Signed)
Subjective:    Patient ID: [name], female    DOB: 1950/05/20, 66 y.o.   MRN: [id number]  DOS:  09/23/2016 Type of visit - description : rov Interval history: No major concerns Medications reviewed, good compliance. Due for labs   Review of Systems Since the last visit she continued to have chest pain, chart reviewed, saw cardiology, workup was done: Negative.  Past Medical History:  Diagnosis Date  . Anterolisthesis    Grade 1, L4-5  . Bronchospasm 05/28/2012  . CHEST PAIN 11/18/2007  . DEGENERATIVE JOINT DISEASE 10/06/2006  . DEPRESSION 09/26/2008  . DIABETES MELLITUS 10/06/2006  . Diverticulosis    2119,4174  . GERD (gastroesophageal reflux disease) 07/25/2013  . HYPERLIPIDEMIA 01/11/2009  . HYPERTENSION 10/06/2006  . INSOMNIA 09/26/2008  . Internal hemorrhoids   . OBSTRUCTIVE SLEEP APNEA 06/23/2008   Severe OSA per sleep study 2010, Rx a CPAP  . Pain in joint, multiple sites 11/10/2006  . UNSPECIFIED ANEMIA 12/10/2009    Past Surgical History:  Procedure Laterality Date  . COLONOSCOPY  08/01/2011   Procedure: COLONOSCOPY;  Surgeon: Inda Castle, MD;  Location: [facility];  Service: Endoscopy;  Laterality: N/A;  . 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 Right knee replacement  2005    Social History   Social History  . Marital status: Widowed    Spouse name: N/A  . Number of children: 4  . Years of education: N/A   Occupational History  . disability Unemployed   Social History Main Topics  . Smoking status: Former Smoker    Packs/day: 0.20    Years: 4.00    Types: Cigarettes    Quit date: 05/13/1995  . Smokeless tobacco: Never Used  . Alcohol use Yes     Comment: Rarely  . Drug use: No  . Sexual activity: Not on file   Other Topics Concern  . Not on file   Social History Narrative   Widow , lives by herself   Lost a son, 3 living       Allergies as of 09/23/2016   No Known Allergies     Medication List       Accurate as of 09/23/16 11:59 PM.  Always use your most recent med list.          ACCU-CHEK AVIVA PLUS test strip Generic drug:  glucose blood TEST twice a day as directed   ACCU-CHEK AVIVA PLUS w/Device Kit 1 Device by Does not apply route once.   ACCU-CHEK SOFTCLIX LANCETS lancets USE TO CHECK BLOOD SUGAR 2 TIMES DAILY   albuterol 108 (90 Base) MCG/ACT inhaler Commonly known as:  PROVENTIL HFA;VENTOLIN HFA Inhale 2 puffs into the lungs every 4 (four) hours as needed for wheezing.   aspirin EC 81 MG tablet Take 81 mg by mouth daily.   BD ULTRA-FINE PEN NEEDLES 29G X 12.7MM Misc Generic drug:  Insulin Pen Needle USE AS DIRECTED 3 TIMES A DAY   budesonide-formoterol 160-4.5 MCG/ACT inhaler Commonly known as:  SYMBICORT Inhale 2 puffs into the lungs 2 (two) times daily.   diltiazem 360 MG 24 hr capsule Commonly known as:  TAZTIA XT Take 1 capsule (360 mg total) by mouth daily.   Insulin Lispro Prot & Lispro (50-50) 100 UNIT/ML Kwikpen Commonly known as:  HUMALOG MIX 50/50 KWIKPEN Inject 50 Units into the skin 2 (two) times daily with a meal. And pen needles 2/day   potassium chloride 10 MEQ tablet Commonly known as:  K-DUR,KLOR-CON Take 1 tablet (10 mEq total) by mouth daily.   rosuvastatin 40 MG tablet Commonly known as:  CRESTOR Take 1 tablet (40 mg total) by mouth daily.   telmisartan 40 MG tablet Commonly known as:  MICARDIS Take 1 tablet (40 mg total) by mouth daily.          Objective:   Physical Exam BP 126/84 (BP Location: Left Arm, Patient Position: Sitting, Cuff Size: Normal)   Pulse 92   Temp 98.4 F (36.9 C) (Oral)   Resp 14   Ht '5\' 9"'$  (1.753 m)   Wt (!) 312 lb 2 oz (141.6 kg)   SpO2 98%   BMI 46.09 kg/m   General:   Well developed, morbidly obese appearing in no distress.  Neck: No  thyromegaly  HEENT:  Normocephalic . Face symmetric, atraumatic Lungs:  CTA B Normal respiratory effort, no intercostal retractions, no accessory muscle use. Heart: RRR,  no murmur.  No  pretibial edema bilaterally    Neurologic:  alert & oriented X3.  Speech normal, gait appropriate for age and unassisted Strength symmetric and appropriate for age.  Psych: Cognition and judgment appear intact.  Cooperative with normal attention span and concentration.  Behavior appropriate.    Assessment & Plan:   Assessment DM per endocrinology HTN Hyperlipidemia Depression, insomnia (citalopram caused headache ? 05-2014) Asthma MSK: --DJD, saw ortho ~06-2014 --Multiple joint pains, 2008, sedimentation rate, RA >> negative --Back pain>> -- MRI 2007: Epidural lipomatosis, congenitally short pedicles, spondylosis, and disc bulges and protrusions contribute to central, foraminal, and subarticular lateral recess stenoses as detailed above.  --2008, saw Ortho, Rx local injections  --local injections back Dr Mina Marble  ~ 12-2015 GERD Morbid obesity OSA dx 2010, severe, nocturnal desaturation, intolerant to CPAP, on nocturnal O2 DOE, chronic CP, chronic RUQ pain:: 2004--Cath neg, areterial LE dopplers (-) 06-2003-- abd. u/s fatty liver 01-2005-- CP...neg u/s GB, neg HIDA 10-06--saw cards--cardiolite neg 12-2007 abnormal stress test Cath 02-01-08: normal coronaries  chest pain, stress test echo okay 09/2016   PLAN: DM: Per endocrinology. HTN: Seems well-controlled, BMP last month satisfactory continue same medications Depression - insomnia: Well-controlled High cholesterol: On Crestor, last LDL not at goal, recheck a FLP Elevated TSH: Last TSH 03-2016 was slightly high, will recheck today. Visit took place when computers were down. RTC 03-2017, CPX, recommend Medicare wellness.

## 2016-09-25 ENCOUNTER — Other Ambulatory Visit: Payer: Self-pay | Admitting: Internal Medicine

## 2016-09-25 DIAGNOSIS — E038 Other specified hypothyroidism: Secondary | ICD-10-CM

## 2016-09-25 MED ORDER — LEVOTHYROXINE SODIUM 50 MCG PO TABS: 50.0000 ug | ORAL_TABLET | Freq: Every day | ORAL | 2 refills | Status: DC

## 2016-10-01 ENCOUNTER — Encounter: Payer: Self-pay | Admitting: Endocrinology

## 2016-10-01 ENCOUNTER — Ambulatory Visit (INDEPENDENT_AMBULATORY_CARE_PROVIDER_SITE_OTHER): Payer: Medicare Other | Admitting: Endocrinology

## 2016-10-01 VITALS — BP 138/84 | HR 82 | Ht 69.0 in | Wt 310.0 lb

## 2016-10-01 DIAGNOSIS — Z794 Long term (current) use of insulin: Secondary | ICD-10-CM | POA: Diagnosis not present

## 2016-10-01 DIAGNOSIS — E119 Type 2 diabetes mellitus without complications: Secondary | ICD-10-CM

## 2016-10-01 LAB — POCT GLYCOSYLATED HEMOGLOBIN (HGB A1C): Hemoglobin A1C: 9

## 2016-10-01 MED ORDER — INSULIN LISPRO PROT & LISPRO (50-50 MIX) 100 UNIT/ML KWIKPEN: 55.0000 [IU] | PEN_INJECTOR | Freq: Two times a day (BID) | SUBCUTANEOUS | 11 refills | Status: DC

## 2016-10-01 NOTE — Progress Notes (Signed)
Subjective:    Patient ID: [name], female    DOB: 1950/10/21, 66 y.o.   MRN: [id number]  HPI Pt returns for f/u of diabetes mellitus: DM type: Insulin-requiring type 2 Dx'ed: 9163 Complications: polyneuropathy and renal insufficiency.   Therapy: insulin since 2004 GDM: never DKA: never Severe hypoglycemia: never.  Pancreatitis: never.   Other: she declines weight-loss surgery; she did not achieve good glycemic control with multiple daily injections, but she has done better with a BID insulin regimen.  She has takes less insulin on Sunday mornings, due to risk hypoglycemia then.   Interval history: she brings a record of her cbg's which I have reviewed today.  It varies from 134.  There is no trend throughout the day. She says she never misses the insulin.  No recent steroids.   Past Medical History:  Diagnosis Date  . Anterolisthesis    Grade 1, L4-5  . Bronchospasm 05/28/2012  . CHEST PAIN 11/18/2007  . DEGENERATIVE JOINT DISEASE 10/06/2006  . DEPRESSION 09/26/2008  . DIABETES MELLITUS 10/06/2006  . Diverticulosis    8466,5993  . GERD (gastroesophageal reflux disease) 07/25/2013  . HYPERLIPIDEMIA 01/11/2009  . HYPERTENSION 10/06/2006  . INSOMNIA 09/26/2008  . Internal hemorrhoids   . OBSTRUCTIVE SLEEP APNEA 06/23/2008   Severe OSA per sleep study 2010, Rx a CPAP  . Pain in joint, multiple sites 11/10/2006  . UNSPECIFIED ANEMIA 12/10/2009    Past Surgical History:  Procedure Laterality Date  . COLONOSCOPY  08/01/2011   Procedure: COLONOSCOPY;  Surgeon: Inda Castle, MD;  Location: [facility];  Service: Endoscopy;  Laterality: N/A;  . 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 Right knee replacement  2005    Social History   Social History  . Marital status: Widowed    Spouse name: N/A  . Number of children: 4  . Years of education: N/A   Occupational History  . disability Unemployed   Social History Main Topics  . Smoking status: Former Smoker    Packs/day: 0.20   Years: 4.00    Types: Cigarettes    Quit date: 05/13/1995  . Smokeless tobacco: Never Used  . Alcohol use Yes     Comment: Rarely  . Drug use: No  . Sexual activity: Not on file   Other Topics Concern  . Not on file   Social History Narrative   Widow , lives by herself   Lost a son, 3 living     Current Outpatient Prescriptions on File Prior to Visit  Medication Sig Dispense Refill  . ACCU-CHEK AVIVA PLUS test strip TEST twice a day as directed (Patient not taking: Reported on 09/23/2016) 100 each 2  . ACCU-CHEK SOFTCLIX LANCETS lancets USE TO CHECK BLOOD SUGAR 2 TIMES DAILY (Patient not taking: Reported on 09/23/2016) 100 each 12  . albuterol (PROVENTIL HFA;VENTOLIN HFA) 108 (90 BASE) MCG/ACT inhaler Inhale 2 puffs into the lungs every 4 (four) hours as needed for wheezing. 1 Inhaler 6  . aspirin EC 81 MG tablet Take 81 mg by mouth daily.    . BD ULTRA-FINE PEN NEEDLES 29G X 12.7MM MISC USE AS DIRECTED 3 TIMES A DAY (Patient not taking: Reported on 09/23/2016) 100 each 3  . Blood Glucose Monitoring Suppl (ACCU-CHEK AVIVA PLUS) W/DEVICE KIT 1 Device by Does not apply route once. (Patient not taking: Reported on 09/23/2016) 1 kit 0  . budesonide-formoterol (SYMBICORT) 160-4.5 MCG/ACT inhaler Inhale 2 puffs into the lungs 2 (two) times daily. 10.2 g  5  . diltiazem (TAZTIA XT) 360 MG 24 hr capsule Take 1 capsule (360 mg total) by mouth daily. 90 capsule 1  . levothyroxine (SYNTHROID, LEVOTHROID) 50 MCG tablet Take 1 tablet (50 mcg total) by mouth daily. 30 tablet 2  . potassium chloride (K-DUR,KLOR-CON) 10 MEQ tablet Take 1 tablet (10 mEq total) by mouth daily. 90 tablet 1  . rosuvastatin (CRESTOR) 40 MG tablet Take 1 tablet (40 mg total) by mouth daily. 90 tablet 1  . telmisartan (MICARDIS) 40 MG tablet Take 1 tablet (40 mg total) by mouth daily. 90 tablet 2   No current facility-administered medications on file prior to visit.     No Known Allergies  Family History  Problem Relation  Age of Onset  . Asthma Mother   . Stroke Mother   . Diabetes Other        M, B, S  . Hypertension Sister        M, S,B  . Pancreatic cancer Brother   . Colon cancer Neg Hx   . Prostate cancer Neg Hx   . Breast cancer Neg Hx     BP 138/84   Pulse 82   Ht '5\' 9"'$  (1.753 m)   Wt (!) 310 lb (140.6 kg)   SpO2 93%   BMI 45.78 kg/m    Review of Systems She denies hypoglycemia.      Objective:   Physical Exam VITAL SIGNS:  See vs page GENERAL: no distress Pulses: dorsalis pedis intact bilat.  MSK: no deformity of the feet.  CV: trace bilat leg edema, and bilat vv's Skin: no ulcer on the feet. normal color and temp on the feet.  Neuro: sensation is intact to touch on the feet.  Ext: There is bilateral onychomycosis of the toenails.    A1c=9.0%    Assessment & Plan:  Insulin-requiring type 2 DM, with renal insuff: she needs increased rx.   Patient Instructions  Please make a follow-up appointment in 2 months.   On this type of insulin schedule, you should eat meals on a regular schedule.  If a meal is missed or significantly delayed, your blood sugar could go low. check your blood sugar 2 times a day.  vary the time of day when you check, between before the 3 meals, and at bedtime.  also check if you have symptoms of your blood sugar being too high or too low.  please keep a record of the readings and bring it to your next appointment here.  please call us sooner if your blood sugar goes below 70, or if it stays over 200.   Please increase the insulin to 55 units twice a day (just before the first and last meals of the day).  However, on Sunday mornings, take just 10 units.

## 2016-10-01 NOTE — Patient Instructions (Addendum)
Please make a follow-up appointment in 2 months.   On this type of insulin schedule, you should eat meals on a regular schedule.  If a meal is missed or significantly delayed, your blood sugar could go low. check your blood sugar 2 times a day.  vary the time of day when you check, between before the 3 meals, and at bedtime.  also check if you have symptoms of your blood sugar being too high or too low.  please keep a record of the readings and bring it to your next appointment here.  please call us sooner if your blood sugar goes below 70, or if it stays over 200.   Please increase the insulin to 55 units twice a day (just before the first and last meals of the day).  However, on Sunday mornings, take just 10 units.

## 2016-10-10 DIAGNOSIS — R0902 Hypoxemia: Secondary | ICD-10-CM | POA: Diagnosis not present

## 2016-10-10 DIAGNOSIS — G4733 Obstructive sleep apnea (adult) (pediatric): Secondary | ICD-10-CM | POA: Diagnosis not present

## 2016-10-24 ENCOUNTER — Other Ambulatory Visit (INDEPENDENT_AMBULATORY_CARE_PROVIDER_SITE_OTHER): Payer: Medicare Other

## 2016-10-24 DIAGNOSIS — E038 Other specified hypothyroidism: Secondary | ICD-10-CM | POA: Diagnosis not present

## 2016-10-24 LAB — TSH: TSH: 12.59 u[IU]/mL — ABNORMAL HIGH (ref 0.35–4.50)

## 2016-10-29 ENCOUNTER — Ambulatory Visit
Admission: RE | Admit: 2016-10-29 | Discharge: 2016-10-29 | Disposition: A | Discharge status: Home / Self Care | Payer: Medicare Other | Source: Ambulatory Visit | Attending: Internal Medicine | Admitting: Internal Medicine

## 2016-10-29 DIAGNOSIS — Z1231 Encounter for screening mammogram for malignant neoplasm of breast: Secondary | ICD-10-CM | POA: Diagnosis not present

## 2016-11-09 DIAGNOSIS — R0902 Hypoxemia: Secondary | ICD-10-CM | POA: Diagnosis not present

## 2016-11-09 DIAGNOSIS — G4733 Obstructive sleep apnea (adult) (pediatric): Secondary | ICD-10-CM | POA: Diagnosis not present

## 2016-11-14 DIAGNOSIS — H40033 Anatomical narrow angle, bilateral: Secondary | ICD-10-CM | POA: Diagnosis not present

## 2016-11-14 DIAGNOSIS — H401122 Primary open-angle glaucoma, left eye, moderate stage: Secondary | ICD-10-CM | POA: Diagnosis not present

## 2016-11-14 DIAGNOSIS — H401111 Primary open-angle glaucoma, right eye, mild stage: Secondary | ICD-10-CM | POA: Diagnosis not present

## 2016-11-14 DIAGNOSIS — H25813 Combined forms of age-related cataract, bilateral: Secondary | ICD-10-CM | POA: Diagnosis not present

## 2016-11-14 DIAGNOSIS — E119 Type 2 diabetes mellitus without complications: Secondary | ICD-10-CM | POA: Diagnosis not present

## 2016-11-14 LAB — HM DIABETES EYE EXAM

## 2016-11-21 ENCOUNTER — Encounter: Payer: Self-pay | Admitting: Internal Medicine

## 2016-11-27 DIAGNOSIS — L602 Onychogryphosis: Secondary | ICD-10-CM | POA: Diagnosis not present

## 2016-11-27 DIAGNOSIS — E1351 Other specified diabetes mellitus with diabetic peripheral angiopathy without gangrene: Secondary | ICD-10-CM | POA: Diagnosis not present

## 2016-11-27 DIAGNOSIS — L84 Corns and callosities: Secondary | ICD-10-CM | POA: Diagnosis not present

## 2016-12-03 ENCOUNTER — Ambulatory Visit: Payer: Medicare Other | Admitting: Endocrinology

## 2016-12-10 DIAGNOSIS — G4733 Obstructive sleep apnea (adult) (pediatric): Secondary | ICD-10-CM | POA: Diagnosis not present

## 2016-12-10 DIAGNOSIS — R0902 Hypoxemia: Secondary | ICD-10-CM | POA: Diagnosis not present

## 2016-12-15 ENCOUNTER — Telehealth: Payer: Self-pay | Admitting: *Deleted

## 2016-12-15 NOTE — Telephone Encounter (Signed)
Called patient and left message to return call.  Need to reschedule AWV. Hoyle Sauer will not be here 12/29/16.  Would like to see if pt wants to reschedule for 03/31/17 @1  with Glenard Haring prior to appt with PCP or for a different day.

## 2016-12-15 NOTE — Telephone Encounter (Signed)
Patient [facility] AWV to 03/31/17 with Glenard Haring at 1pm prior to seeing Dr. Larose Kells at 1:30pm.

## 2016-12-23 ENCOUNTER — Ambulatory Visit (INDEPENDENT_AMBULATORY_CARE_PROVIDER_SITE_OTHER): Payer: Medicare Other | Admitting: Endocrinology

## 2016-12-23 ENCOUNTER — Encounter: Payer: Self-pay | Admitting: Endocrinology

## 2016-12-23 VITALS — BP 136/84 | HR 92 | Ht 69.0 in | Wt 306.0 lb

## 2016-12-23 DIAGNOSIS — Z794 Long term (current) use of insulin: Secondary | ICD-10-CM | POA: Diagnosis not present

## 2016-12-23 DIAGNOSIS — E119 Type 2 diabetes mellitus without complications: Secondary | ICD-10-CM

## 2016-12-23 LAB — POCT GLYCOSYLATED HEMOGLOBIN (HGB A1C): Hemoglobin A1C: 9.3

## 2016-12-23 MED ORDER — GLUCOSE BLOOD VI STRP: 1.0000 | ORAL_STRIP | Freq: Two times a day (BID) | 2 refills | Status: DC

## 2016-12-23 NOTE — Progress Notes (Signed)
Subjective:    Patient ID: [name], female    DOB: Nov 13, 1950, 66 y.o.   MRN: [id number]  HPI Pt returns for f/u of diabetes mellitus: DM type: Insulin-requiring type 2 Dx'ed: 8101 Complications: polyneuropathy and renal insufficiency.   Therapy: insulin since 2004 GDM: never DKA: never Severe hypoglycemia: never.  Pancreatitis: never.   Other: she declines weight-loss surgery; she did not achieve good glycemic control with multiple daily injections, but she has done better with a BID insulin regimen.  She has takes less insulin on Sunday mornings, due to risk hypoglycemia then.   Interval history: she brings a record of her cbg's which I have reviewed today.  It varies from 144-300.  There is no trend throughout the day. She says she never misses the insulin, but she only takes 50 units bid.  No recent steroids. Past Medical History:  Diagnosis Date  . Anterolisthesis    Grade 1, L4-5  . Bronchospasm 05/28/2012  . CHEST PAIN 11/18/2007  . DEGENERATIVE JOINT DISEASE 10/06/2006  . DEPRESSION 09/26/2008  . DIABETES MELLITUS 10/06/2006  . Diverticulosis    7510,2585  . GERD (gastroesophageal reflux disease) 07/25/2013  . HYPERLIPIDEMIA 01/11/2009  . HYPERTENSION 10/06/2006  . INSOMNIA 09/26/2008  . Internal hemorrhoids   . OBSTRUCTIVE SLEEP APNEA 06/23/2008   Severe OSA per sleep study 2010, Rx a CPAP  . Pain in joint, multiple sites 11/10/2006  . UNSPECIFIED ANEMIA 12/10/2009    Past Surgical History:  Procedure Laterality Date  . COLONOSCOPY  08/01/2011   Procedure: COLONOSCOPY;  Surgeon: Inda Castle, MD;  Location: [facility];  Service: Endoscopy;  Laterality: N/A;  . 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 Right knee replacement  2005    Social History   Social History  . Marital status: Widowed    Spouse name: N/A  . Number of children: 4  . Years of education: N/A   Occupational History  . disability Unemployed   Social History Main Topics  . Smoking status:  Former Smoker    Packs/day: 0.20    Years: 4.00    Types: Cigarettes    Quit date: 05/13/1995  . Smokeless tobacco: Never Used  . Alcohol use Yes     Comment: Rarely  . Drug use: No  . Sexual activity: Not on file   Other Topics Concern  . Not on file   Social History Narrative   Widow , lives by herself   Lost a son, 3 living     Current Outpatient Prescriptions on File Prior to Visit  Medication Sig Dispense Refill  . ACCU-CHEK SOFTCLIX LANCETS lancets USE TO CHECK BLOOD SUGAR 2 TIMES DAILY 100 each 12  . aspirin EC 81 MG tablet Take 81 mg by mouth daily.    . BD ULTRA-FINE PEN NEEDLES 29G X 12.7MM MISC USE AS DIRECTED 3 TIMES A DAY 100 each 3  . Blood Glucose Monitoring Suppl (ACCU-CHEK AVIVA PLUS) W/DEVICE KIT 1 Device by Does not apply route once. 1 kit 0  . budesonide-formoterol (SYMBICORT) 160-4.5 MCG/ACT inhaler Inhale 2 puffs into the lungs 2 (two) times daily. 10.2 g 5  . diltiazem (TAZTIA XT) 360 MG 24 hr capsule Take 1 capsule (360 mg total) by mouth daily. 90 capsule 1  . Insulin Lispro Prot & Lispro (HUMALOG MIX 50/50 KWIKPEN) (50-50) 100 UNIT/ML Kwikpen Inject 55 Units into the skin 2 (two) times daily with a meal. And pen needles 2/day 45 mL 11  . potassium  chloride (K-DUR,KLOR-CON) 10 MEQ tablet Take 1 tablet (10 mEq total) by mouth daily. 90 tablet 1  . rosuvastatin (CRESTOR) 40 MG tablet Take 1 tablet (40 mg total) by mouth daily. 90 tablet 1  . telmisartan (MICARDIS) 40 MG tablet Take 1 tablet (40 mg total) by mouth daily. 90 tablet 2  . albuterol (PROVENTIL HFA;VENTOLIN HFA) 108 (90 BASE) MCG/ACT inhaler Inhale 2 puffs into the lungs every 4 (four) hours as needed for wheezing. 1 Inhaler 6   No current facility-administered medications on file prior to visit.     No Known Allergies  Family History  Problem Relation Age of Onset  . Asthma Mother   . Stroke Mother   . Diabetes Other        M, B, S  . Hypertension Sister        M, S,B  . Pancreatic cancer  Brother   . Colon cancer Neg Hx   . Prostate cancer Neg Hx   . Breast cancer Neg Hx     BP 136/84   Pulse 92   Ht _0  (1.753 m)   Wt (!) 306 lb (138.8 kg)   SpO2 96%   BMI 45.19 kg/m    Review of Systems She denies hypoglycemia.     Objective:   Physical Exam VITAL SIGNS:  See vs page GENERAL: no distress Pulses: foot pulses are intact bilaterally.   MSK: no deformity of the feet or ankles.  CV: 1+ bilat leg edema, and bilat vv's.    Skin:  no ulcer on the feet or ankles.  normal color and temp on the feet and ankles.  Neuro: sensation is intact to touch on the feet and ankles.   Ext: There is bilateral onychomycosis of the toenails.   Lab Results  Component Value Date   HGBA1C 9.3 12/23/2016      Assessment & Plan:  Insulin-requiring type 2 DM, with renal insuff: worse.  Noncompliance with insulin dosing.  We discussed need to increase insulin.    Patient Instructions  Please make a follow-up appointment in 2 months.   On this type of insulin schedule, you should eat meals on a regular schedule.  If a meal is missed or significantly delayed, your blood sugar could go low. check your blood sugar 2 times a day.  vary the time of day when you check, between before the 3 meals, and at bedtime.  also check if you have symptoms of your blood sugar being too high or too low.  please keep a record of the readings and bring it to your next appointment here.  please call us sooner if your blood sugar goes below 70, or if it stays over 200.   Please increase the insulin to 60 units twice a day (just before the first and last meals of the day).  However, on Sunday mornings, take just 10 units.

## 2016-12-23 NOTE — Patient Instructions (Addendum)
Please make a follow-up appointment in 2 months.   On this type of insulin schedule, you should eat meals on a regular schedule.  If a meal is missed or significantly delayed, your blood sugar could go low. check your blood sugar 2 times a day.  vary the time of day when you check, between before the 3 meals, and at bedtime.  also check if you have symptoms of your blood sugar being too high or too low.  please keep a record of the readings and bring it to your next appointment here.  please call us sooner if your blood sugar goes below 70, or if it stays over 200.   Please increase the insulin to 60 units twice a day (just before the first and last meals of the day).  However, on Sunday mornings, take just 10 units.

## 2016-12-24 ENCOUNTER — Other Ambulatory Visit: Payer: Self-pay

## 2016-12-24 ENCOUNTER — Other Ambulatory Visit: Payer: Self-pay | Admitting: Internal Medicine

## 2016-12-24 DIAGNOSIS — E039 Hypothyroidism, unspecified: Secondary | ICD-10-CM

## 2016-12-24 MED ORDER — LEVOTHYROXINE SODIUM 50 MCG PO TABS: 50.0000 ug | ORAL_TABLET | Freq: Every day | ORAL | 0 refills | Status: DC

## 2016-12-29 ENCOUNTER — Ambulatory Visit: Payer: Medicare Other | Admitting: *Deleted

## 2016-12-30 ENCOUNTER — Ambulatory Visit (INDEPENDENT_AMBULATORY_CARE_PROVIDER_SITE_OTHER): Payer: Medicare Other | Admitting: Pulmonary Disease

## 2016-12-30 ENCOUNTER — Encounter: Payer: Self-pay | Admitting: Pulmonary Disease

## 2016-12-30 VITALS — BP 138/88 | HR 101 | Ht 69.0 in | Wt 305.0 lb

## 2016-12-30 DIAGNOSIS — G4733 Obstructive sleep apnea (adult) (pediatric): Secondary | ICD-10-CM | POA: Diagnosis not present

## 2016-12-30 DIAGNOSIS — Z6841 Body Mass Index (BMI) 40.0 and over, adult: Secondary | ICD-10-CM | POA: Diagnosis not present

## 2016-12-30 DIAGNOSIS — J4540 Moderate persistent asthma, uncomplicated: Secondary | ICD-10-CM

## 2016-12-30 DIAGNOSIS — J9611 Chronic respiratory failure with hypoxia: Secondary | ICD-10-CM

## 2016-12-30 NOTE — Progress Notes (Signed)
Current Outpatient Prescriptions on File Prior to Visit  Medication Sig  . ACCU-CHEK SOFTCLIX LANCETS lancets USE TO CHECK BLOOD SUGAR 2 TIMES DAILY  . albuterol (PROVENTIL HFA;VENTOLIN HFA) 108 (90 BASE) MCG/ACT inhaler Inhale 2 puffs into the lungs every 4 (four) hours as needed for wheezing.  Marland Kitchen aspirin EC 81 MG tablet Take 81 mg by mouth daily.  . BD ULTRA-FINE PEN NEEDLES 29G X 12.7MM MISC USE AS DIRECTED 3 TIMES A DAY  . Blood Glucose Monitoring Suppl (ACCU-CHEK AVIVA PLUS) W/DEVICE KIT 1 Device by Does not apply route once.  . budesonide-formoterol (SYMBICORT) 160-4.5 MCG/ACT inhaler Inhale 2 puffs into the lungs 2 (two) times daily.  Marland Kitchen diltiazem (TAZTIA XT) 360 MG 24 hr capsule Take 1 capsule (360 mg total) by mouth daily.  Marland Kitchen glucose blood (ACCU-CHEK AVIVA PLUS) test strip 1 each by Other route 2 (two) times daily. And lancets 2/day  . Insulin Lispro Prot & Lispro (HUMALOG MIX 50/50 KWIKPEN) (50-50) 100 UNIT/ML Kwikpen Inject 55 Units into the skin 2 (two) times daily with a meal. And pen needles 2/day  . latanoprost (XALATAN) 0.005 % ophthalmic solution INT 1 GTT INTO OU HS  . levothyroxine (SYNTHROID, LEVOTHROID) 50 MCG tablet Take 1 tablet (50 mcg total) by mouth daily before breakfast.  . potassium chloride (K-DUR,KLOR-CON) 10 MEQ tablet Take 1 tablet (10 mEq total) by mouth daily.  . rosuvastatin (CRESTOR) 40 MG tablet Take 1 tablet (40 mg total) by mouth daily.  Marland Kitchen telmisartan (MICARDIS) 40 MG tablet Take 1 tablet (40 mg total) by mouth daily.   No current facility-administered medications on file prior to visit.      Chief Complaint  Patient presents with  . Follow-up    Pt denies any asthma flares - no congestion, SOB, Chest tightness or wheezing. No new complaints.      Sleep tests PSG 07/11/08 >> AHI 88, SpO2 low 62%  Cardiac tests  Echo 09/09/16 >> mod LVH, EF 55 to 60%  Past medical hx Depression, DM, GERD, HLD. HTN, Asthma  Past surgical hx, Allergies, Family hx,  Social hx all reviewed.  Vital Signs BP 138/88 (BP Location: Left Wrist, Cuff Size: Normal)   Pulse (!) 101   Ht '5\' 9"'$  (1.753 m)   Wt (!) 305 lb (138.3 kg)   SpO2 100%   BMI 45.04 kg/m   History of Present Illness [name] is a 66 y.o. female former smoker with obstructive sleep apnea and asthma.  She was confused about her inhalers.  She uses albuterol bid, and sybmicort prn.  She has been using symbicort several times per week.  She gets intermittent cough and chest congestion, but better with inhalers.  She is not having sinus congestion.  She gets noses bleeds from O2 at night.  She doesn't think she needs O2 anymore.  She denies skin rash or leg swelling.  She was to have PFT.  She had this schedule in October 2017 but couldn't complete the test.  Physical Exam  General - pleasant Eyes - pupils reactive ENT - no sinus tenderness, no oral exudate, no LAN Cardiac - regular, no murmur Chest - no wheeze, rales Abd - soft, non tender Ext - no edema Skin - no rashes Neuro - normal strength Psych - normal mood   Assessment/Plan  Obstructive sleep apnea. - she is intolerant of CPAP  Chronic respiratory failure with hypoxia. - she has been getting epistaxis from nasal cannula supplemental oxygen and doesn't think  she needs nocturnal oxygen anymore - will arrange for room air ONO  Morbid obesity. - discussed the importance of weight loss  Persistent asthma. - reviewed the different roles for her inhalers and proper use of her inhalers - continue symbicort as maintenance inhaler and albuterol prn   Patient Instructions  Use symbicort two sprays in the morning and two sprays at night, and rinse mouth after each use  Use albuterol two puffs every 4 to 6 hours as needed for cough, wheeze, or chest congestion  Will arrange for overnight oxygen test  Follow up in 1 year     Chesley Mires, MD [facility] Pager:   [phone] 12/30/2016, 2:32 PM

## 2016-12-30 NOTE — Patient Instructions (Signed)
Use symbicort two sprays in the morning and two sprays at night, and rinse mouth after each use  Use albuterol two puffs every 4 to 6 hours as needed for cough, wheeze, or chest congestion  Will arrange for overnight oxygen test  Follow up in 1 year

## 2017-01-10 DIAGNOSIS — R0902 Hypoxemia: Secondary | ICD-10-CM | POA: Diagnosis not present

## 2017-01-10 DIAGNOSIS — G4733 Obstructive sleep apnea (adult) (pediatric): Secondary | ICD-10-CM | POA: Diagnosis not present

## 2017-01-12 ENCOUNTER — Other Ambulatory Visit: Payer: Self-pay | Admitting: Internal Medicine

## 2017-01-13 DIAGNOSIS — H25813 Combined forms of age-related cataract, bilateral: Secondary | ICD-10-CM | POA: Diagnosis not present

## 2017-01-13 DIAGNOSIS — H401111 Primary open-angle glaucoma, right eye, mild stage: Secondary | ICD-10-CM | POA: Diagnosis not present

## 2017-01-13 DIAGNOSIS — H401122 Primary open-angle glaucoma, left eye, moderate stage: Secondary | ICD-10-CM | POA: Diagnosis not present

## 2017-01-21 ENCOUNTER — Telehealth: Payer: Self-pay | Admitting: Pulmonary Disease

## 2017-01-21 NOTE — Telephone Encounter (Signed)
ONO with RA 01/15/17 >> test time 7 hrs 53 min.  Mean SpO2 93.2%, low SpO2 76%.  Spent 19 min with SpO2 < 88%.   Please let her know that oxygen test shows her oxygen still gets low at night.  If she is not able to restart CPAP, then she should continue using supplemental oxygen at night and this can be set at 1 liter.

## 2017-01-22 NOTE — Telephone Encounter (Signed)
Called pt letting her know the results of the ONO. Pt does not want to restart CPAP at this time. Told pt to do 1L O2 at bedtime to help keep her O2 sats from dropping at night. Pt expressed understanding as she does currently have O2 at home. Nothing further needed at this time.

## 2017-01-25 ENCOUNTER — Other Ambulatory Visit: Payer: Self-pay | Admitting: Internal Medicine

## 2017-02-09 DIAGNOSIS — R0902 Hypoxemia: Secondary | ICD-10-CM | POA: Diagnosis not present

## 2017-02-09 DIAGNOSIS — G4733 Obstructive sleep apnea (adult) (pediatric): Secondary | ICD-10-CM | POA: Diagnosis not present

## 2017-02-10 ENCOUNTER — Other Ambulatory Visit: Payer: Self-pay | Admitting: Internal Medicine

## 2017-02-20 ENCOUNTER — Telehealth: Payer: Self-pay | Admitting: Internal Medicine

## 2017-02-20 NOTE — Telephone Encounter (Signed)
Letter printed and mailed to Pt. TSH order pending.

## 2017-02-20 NOTE — Telephone Encounter (Signed)
Overdue for a TSH check. Please call the patient, ask about thyroid medication compliance and arrange a TSH.

## 2017-02-23 ENCOUNTER — Encounter: Payer: Self-pay | Admitting: Endocrinology

## 2017-02-23 ENCOUNTER — Ambulatory Visit (INDEPENDENT_AMBULATORY_CARE_PROVIDER_SITE_OTHER): Payer: Medicare Other | Admitting: Endocrinology

## 2017-02-23 VITALS — BP 160/98 | HR 95 | Wt 307.4 lb

## 2017-02-23 DIAGNOSIS — E1042 Type 1 diabetes mellitus with diabetic polyneuropathy: Secondary | ICD-10-CM

## 2017-02-23 LAB — POCT GLYCOSYLATED HEMOGLOBIN (HGB A1C): HEMOGLOBIN A1C: 9.1

## 2017-02-23 MED ORDER — INSULIN LISPRO PROT & LISPRO (50-50 MIX) 100 UNIT/ML KWIKPEN: 60.0000 [IU] | PEN_INJECTOR | Freq: Two times a day (BID) | SUBCUTANEOUS | 11 refills | Status: DC

## 2017-02-23 NOTE — Patient Instructions (Addendum)
check your blood sugar 2 times a day.  vary the time of day when you check, between before the 3 meals, and at bedtime.  also check if you have symptoms of your blood sugar being too high or too low.  please keep a record of the readings and bring it to your next appointment here.  please call us sooner if your blood sugar goes below 70, or if it stays over 200.   Please increase the insulin to 60 units twice a day (just before the first and last meals of the day).  However, on Sunday mornings, take just 10 units.   On this type of insulin schedule, you should eat meals on a regular schedule.  If a meal is missed or significantly delayed, your blood sugar could go low. Please come back for a follow-up appointment in 3 months.

## 2017-02-23 NOTE — Progress Notes (Signed)
 Subjective:    Patient ID: Leolia D Baise, female    DOB: 10/12/1950, 66 y.o.   MRN: [id number]  HPI Pt returns for f/u of diabetes mellitus: DM type: Insulin-requiring type 2 Dx'ed: 2002 Complications: polyneuropathy and renal insufficiency.   Therapy: insulin since 2004 GDM: never DKA: never Severe hypoglycemia: never.  Pancreatitis: never.   Other: she declines weight-loss surgery; she did not achieve good glycemic control with multiple daily injections, but she has done better with a BID insulin regimen.  She has takes less insulin on Sunday mornings, due to risk hypoglycemia then.   Interval history: she brings a record of her cbg's which I have reviewed today.  It varies from 110-200's.  There is no trend throughout the day. She says she never misses the insulin, but she still only takes 50 units bid.  No recent steroids.  Past Medical History:  Diagnosis Date  . Anterolisthesis    Grade 1, L4-5  . Bronchospasm 05/28/2012  . CHEST PAIN 11/18/2007  . DEGENERATIVE JOINT DISEASE 10/06/2006  . DEPRESSION 09/26/2008  . DIABETES MELLITUS 10/06/2006  . Diverticulosis    2008,2005  . GERD (gastroesophageal reflux disease) 07/25/2013  . HYPERLIPIDEMIA 01/11/2009  . HYPERTENSION 10/06/2006  . INSOMNIA 09/26/2008  . Internal hemorrhoids   . OBSTRUCTIVE SLEEP APNEA 06/23/2008   Severe OSA per sleep study 2010, Rx a CPAP  . Pain in joint, multiple sites 11/10/2006  . UNSPECIFIED ANEMIA 12/10/2009    Past Surgical History:  Procedure Laterality Date  . COLONOSCOPY  08/01/2011   Procedure: COLONOSCOPY;  Surgeon: Robert D Kaplan, MD;  Location: [facility];  Service: Endoscopy;  Laterality: N/A;  . 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 Right knee replacement  2005    Social History   Social History  . Marital status: Widowed    Spouse name: N/A  . Number of children: 4  . Years of education: N/A   Occupational History  . disability Unemployed   Social History Main Topics  . Smoking  status: Former Smoker    Packs/day: 0.20    Years: 4.00    Types: Cigarettes    Quit date: 05/13/1995  . Smokeless tobacco: Never Used  . Alcohol use Yes     Comment: Rarely  . Drug use: No  . Sexual activity: Not on file   Other Topics Concern  . Not on file   Social History Narrative   Widow , lives by herself   Lost a son, 3 living     Current Outpatient Prescriptions on File Prior to Visit  Medication Sig Dispense Refill  . ACCU-CHEK SOFTCLIX LANCETS lancets USE TO CHECK BLOOD SUGAR 2 TIMES DAILY 100 each 12  . albuterol (PROVENTIL HFA;VENTOLIN HFA) 108 (90 BASE) MCG/ACT inhaler Inhale 2 puffs into the lungs every 4 (four) hours as needed for wheezing. 1 Inhaler 6  . aspirin EC 81 MG tablet Take 81 mg by mouth daily.    . BD ULTRA-FINE PEN NEEDLES 29G X 12.7MM MISC USE AS DIRECTED 3 TIMES A DAY 100 each 3  . Blood Glucose Monitoring Suppl (ACCU-CHEK AVIVA PLUS) W/DEVICE KIT 1 Device by Does not apply route once. 1 kit 0  . budesonide-formoterol (SYMBICORT) 160-4.5 MCG/ACT inhaler Inhale 2 puffs into the lungs 2 (two) times daily. 10.2 g 5  . diltiazem (CARDIZEM CD) 360 MG 24 hr capsule Take 1 capsule (360 mg total) by mouth daily. 90 capsule 0  . diltiazem (TAZTIA XT) 360 MG 24   hr capsule Take 1 capsule (360 mg total) by mouth daily. 90 capsule 1  . glucose blood (ACCU-CHEK AVIVA PLUS) test strip 1 each by Other route 2 (two) times daily. And lancets 2/day 200 each 2  . latanoprost (XALATAN) 0.005 % ophthalmic solution INT 1 GTT INTO OU HS  10  . levothyroxine (SYNTHROID, LEVOTHROID) 50 MCG tablet Take 1 tablet (50 mcg total) by mouth daily before breakfast. 7 tablet 0  . potassium chloride (K-DUR,KLOR-CON) 10 MEQ tablet Take 1 tablet (10 mEq total) by mouth daily. 90 tablet 0  . rosuvastatin (CRESTOR) 40 MG tablet Take 1 tablet (40 mg total) by mouth daily. 90 tablet 0  . telmisartan (MICARDIS) 40 MG tablet Take 1 tablet (40 mg total) by mouth daily. 90 tablet 2   No current  facility-administered medications on file prior to visit.     No Known Allergies  Family History  Problem Relation Age of Onset  . Asthma Mother   . Stroke Mother   . Diabetes Other        M, B, S  . Hypertension Sister        M, S,B  . Pancreatic cancer Brother   . Colon cancer Neg Hx   . Prostate cancer Neg Hx   . Breast cancer Neg Hx     BP (!) 160/98   Pulse 95   Wt (!) 307 lb 6.4 oz (139.4 kg)   SpO2 98%   BMI 45.40 kg/m    Review of Systems She denies hypoglycemia.  No weight change.      Objective:   Physical Exam VITAL SIGNS:  See vs page GENERAL: no distress Pulses: foot pulses are intact bilaterally.   MSK: no deformity of the feet or ankles.  CV: trace bilat leg edema, and bilat vv's.    Skin:  no ulcer on the feet or ankles.  normal color and temp on the feet and ankles.  Neuro: sensation is intact to touch on the feet and ankles.   Ext: There is bilateral onychomycosis of the toenails.    Lab Results  Component Value Date   HGBA1C 9.1 02/23/2017      Assessment & Plan:  Insulin-requiring type 2 DM, with polyneuropathy: worse.   Renal insuff: she is at risk for hypoglycemia.    Patient Instructions  check your blood sugar 2 times a day.  vary the time of day when you check, between before the 3 meals, and at bedtime.  also check if you have symptoms of your blood sugar being too high or too low.  please keep a record of the readings and bring it to your next appointment here.  please call us sooner if your blood sugar goes below 70, or if it stays over 200.   Please increase the insulin to 60 units twice a day (just before the first and last meals of the day).  However, on Sunday mornings, take just 10 units.   On this type of insulin schedule, you should eat meals on a regular schedule.  If a meal is missed or significantly delayed, your blood sugar could go low. Please come back for a follow-up appointment in 3 months.         

## 2017-02-26 ENCOUNTER — Telehealth: Payer: Self-pay | Admitting: Internal Medicine

## 2017-02-26 ENCOUNTER — Other Ambulatory Visit: Payer: Self-pay | Admitting: Internal Medicine

## 2017-02-26 DIAGNOSIS — E039 Hypothyroidism, unspecified: Secondary | ICD-10-CM

## 2017-02-26 NOTE — Telephone Encounter (Signed)
Letter informed Pt to call office to schedule lab appt only, she is overdue to recheck her TSH (thyroid levels). Please schedule as soon as possible. TSH already ordered on 12/24/2016.

## 2017-02-26 NOTE — Telephone Encounter (Signed)
Called to schedule pt she said that she will try. She said that she have a time getting transportation.    Pt says that she have an apt at her podiatrist at [facility] so she would like to have orders set up at [facility] location so that she can have labs completed.

## 2017-02-26 NOTE — Telephone Encounter (Signed)
Pt received letter and would like to know if she need to come in sooner than scheduled?    Please advise.

## 2017-02-27 NOTE — Telephone Encounter (Signed)
TSH ordered for [facility].

## 2017-02-27 NOTE — Addendum Note (Signed)
Addended byDamita Dunnings D on: 02/27/2017 07:50 AM   Modules accepted: Orders

## 2017-03-02 NOTE — Telephone Encounter (Signed)
Order Details   Frequency Duration Priority Order Class  None None Routine  [facility]    Unsure why [facility] could not see order. See above, ordered as [facility] collect.

## 2017-03-02 NOTE — Telephone Encounter (Signed)
Pt called in to make provider aware that she went to [facility] location. Pt says that they informed her that they didn't see order for labs. Pt says that she would like to just have labs when she comes in. In previous discussion I made pt aware that labs needed to be completed before her apt.

## 2017-03-05 DIAGNOSIS — L84 Corns and callosities: Secondary | ICD-10-CM | POA: Diagnosis not present

## 2017-03-05 DIAGNOSIS — L602 Onychogryphosis: Secondary | ICD-10-CM | POA: Diagnosis not present

## 2017-03-09 ENCOUNTER — Other Ambulatory Visit: Payer: Self-pay

## 2017-03-09 ENCOUNTER — Other Ambulatory Visit (INDEPENDENT_AMBULATORY_CARE_PROVIDER_SITE_OTHER): Payer: Medicare Other

## 2017-03-09 DIAGNOSIS — E039 Hypothyroidism, unspecified: Secondary | ICD-10-CM

## 2017-03-09 LAB — TSH: TSH: 46.15 u[IU]/mL — ABNORMAL HIGH (ref 0.35–4.50)

## 2017-03-09 MED ORDER — TELMISARTAN 40 MG PO TABS: 40.0000 mg | ORAL_TABLET | Freq: Every day | ORAL | 0 refills | Status: DC

## 2017-03-11 MED ORDER — LEVOTHYROXINE SODIUM 50 MCG PO TABS: 50.0000 ug | ORAL_TABLET | Freq: Every day | ORAL | 3 refills | Status: DC

## 2017-03-12 DIAGNOSIS — G4733 Obstructive sleep apnea (adult) (pediatric): Secondary | ICD-10-CM | POA: Diagnosis not present

## 2017-03-12 DIAGNOSIS — R0902 Hypoxemia: Secondary | ICD-10-CM | POA: Diagnosis not present

## 2017-03-27 NOTE — Progress Notes (Addendum)
Subjective:   [name] is a 66 y.o. female who presents for Medicare Annual (Subsequent) preventive examination.  Review of Systems:  No ROS.  Medicare Wellness Visit. Additional risk factors are reflected in the social history.  Cardiac Risk Factors include: advanced age (>38mn, >>75women);diabetes mellitus;dyslipidemia;hypertension;obesity (BMI >30kg/m2);sedentary lifestyle;smoking/ tobacco exposure Sleep patterns: Naps often during the day. Doesn't sleep well at night.  Female:   Pap- 12/30/11-NORMAL      Mammo-   10/29/16   NORMAL Dexa scan- ORDERED        CCS- 08/01/11-7 YR RECALL     Objective:     Vitals: BP 140/76 (BP Location: Left Arm, Patient Position: Sitting, Cuff Size: Large)   Pulse 92   Ht '5\' 9"'$  (1.753 m)   Wt (!) 311 lb 9.6 oz (141.3 kg)   SpO2 99%   BMI 46.02 kg/m   Body mass index is 46.02 kg/m.   Tobacco Social History   Tobacco Use  Smoking Status Former Smoker  . Packs/day: 0.20  . Years: 4.00  . Pack years: 0.80  . Types: Cigarettes  . Last attempt to quit: 05/13/1995  . Years since quitting: 21.8  Smokeless Tobacco Never Used     Counseling given: Not Answered   Past Medical History:  Diagnosis Date  . Anterolisthesis    Grade 1, L4-5  . Bronchospasm 05/28/2012  . CHEST PAIN 11/18/2007  . DEGENERATIVE JOINT DISEASE 10/06/2006  . DEPRESSION 09/26/2008  . DIABETES MELLITUS 10/06/2006  . Diverticulosis    [id number] . GERD (gastroesophageal reflux disease) 07/25/2013  . HYPERLIPIDEMIA 01/11/2009  . HYPERTENSION 10/06/2006  . INSOMNIA 09/26/2008  . Internal hemorrhoids   . OBSTRUCTIVE SLEEP APNEA 06/23/2008   Severe OSA per sleep study 2010, Rx a CPAP  . Pain in joint, multiple sites 11/10/2006  . UNSPECIFIED ANEMIA 12/10/2009   Past Surgical History:  Procedure Laterality Date  . COLONOSCOPY  08/01/2011   Procedure: COLONOSCOPY;  Surgeon: RInda Castle MD;  Location: [facility];  Service: Endoscopy;  Laterality: N/A;  . 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 Right knee replacement  2005   Family History  Problem Relation Age of Onset  . Asthma Mother   . Stroke Mother   . Diabetes Other        M, B, S  . Hypertension Sister        M, S,B  . Pancreatic cancer Brother   . Colon cancer Neg Hx   . Prostate cancer Neg Hx   . Breast cancer Neg Hx    Social History   Substance and Sexual Activity  Sexual Activity Yes    Outpatient Encounter Medications as of 03/31/2017  Medication Sig  . ACCU-CHEK SOFTCLIX LANCETS lancets USE TO CHECK BLOOD SUGAR 2 TIMES DAILY  . aspirin EC 81 MG tablet Take 81 mg by mouth daily.  . BD ULTRA-FINE PEN NEEDLES 29G X 12.7MM MISC USE AS DIRECTED 3 TIMES A DAY  . Blood Glucose Monitoring Suppl (ACCU-CHEK AVIVA PLUS) W/DEVICE KIT 1 Device by Does not apply route once.  . diltiazem (TAZTIA XT) 360 MG 24 hr capsule Take 1 capsule (360 mg total) by mouth daily.  .Marland Kitchenglucose blood (ACCU-CHEK AVIVA PLUS) test strip 1 each by Other route 2 (two) times daily. And lancets 2/day  . Insulin Lispro Prot & Lispro (HUMALOG MIX 50/50 KWIKPEN) (50-50) 100 UNIT/ML Kwikpen Inject 60 Units into the skin 2 (two) times daily with a meal. And  pen needles 2/day  . levothyroxine (SYNTHROID, LEVOTHROID) 50 MCG tablet Take 1 tablet (50 mcg total) by mouth daily before breakfast.  . potassium chloride (K-DUR,KLOR-CON) 10 MEQ tablet Take 1 tablet (10 mEq total) by mouth daily.  . rosuvastatin (CRESTOR) 40 MG tablet Take 1 tablet (40 mg total) by mouth daily.  Marland Kitchen telmisartan (MICARDIS) 40 MG tablet Take 1 tablet (40 mg total) by mouth daily.  Marland Kitchen albuterol (PROVENTIL HFA;VENTOLIN HFA) 108 (90 BASE) MCG/ACT inhaler Inhale 2 puffs into the lungs every 4 (four) hours as needed for wheezing.  . budesonide-formoterol (SYMBICORT) 160-4.5 MCG/ACT inhaler Inhale 2 puffs into the lungs 2 (two) times daily.  Marland Kitchen diltiazem (CARDIZEM CD) 360 MG 24 hr capsule Take 1 capsule (360 mg total) by mouth daily.  Marland Kitchen latanoprost (XALATAN)  0.005 % ophthalmic solution INT 1 GTT INTO OU HS   No facility-administered encounter medications on file as of 03/31/2017.     Activities of Daily Living In your present state of health, do you have any difficulty performing the following activities: 03/31/2017  Hearing? N  Vision? N  Comment uses reading glasses. Dr.Groat yearly.  Difficulty concentrating or making decisions? N  Walking or climbing stairs? N  Comment SOB with minimal exertion and knee pain  Dressing or bathing? N  Doing errands, shopping? Y  Comment has never driven  Conservation officer, nature and eating ? N  Using the Toilet? N  In the past six months, have you accidently leaked urine? N  Do you have problems with loss of bowel control? N  Managing your Medications? N  Managing your Finances? N  Housekeeping or managing your Housekeeping? N  Some recent data might be hidden    Patient Care Team: Colon Branch, MD as PCP - General Renato Shin, MD as Consulting Physician (Endocrinology) Clance, Armando Reichert, MD as Consulting Physician (Pulmonary Disease) Clent Jacks, MD as Consulting Physician (Ophthalmology) Berle Mull, MD as Consulting Physician (Family Medicine) Phylliss Bob, MD as Consulting Physician (Orthopedic Surgery)    Assessment:    Physical assessment deferred to PCP.  Exercise Activities and Dietary recommendations Current Exercise Habits: The patient does not participate in regular exercise at present, Exercise limited by: respiratory conditions(s);Other - see comments(pt get SOB with minimal exertion)   Diet (meal preparation, eat out, water intake, caffeinated beverages, dairy products, fruits and vegetables): 24 hour recall Breakfast:oatmeal, sausage links, water Lunch: bologna and cheese sandwich, grape juice Dinner:  Chicken wings and salad. Pt states she drinks plenty of water    Goals    . Increase physical activity (pt-stated)     Walk the hall 3x/week.      Fall Risk Fall Risk   03/31/2017 03/26/2016 10/31/2015 02/22/2015 10/26/2013  Falls in the past year? Yes No No No Yes  Number falls in past yr: 1 - - - 1  Injury with Fall? No - - - Yes  Comment - - - - left arm pain  Follow up Education provided;Falls prevention discussed - - - -   Depression Screen PHQ 2/9 Scores 03/31/2017 03/26/2016 10/31/2015 02/22/2015  PHQ - 2 Score 2 0 0 0  PHQ- 9 Score 5 - - -  Exception Documentation - - - Other- indicate reason in comment box  Not completed - - - Pt's son passed in June 2016     Cognitive Function MMSE - Mini Mental State Exam 03/31/2017  Orientation to time 5  Orientation to Place 5  Registration 3  Attention/ Calculation 5  Recall 3  Language- name 2 objects 2  Language- repeat 1  Language- follow 3 step command 3  Language- read & follow direction 1  Write a sentence 1  Copy design 1  Total score 30        Immunization History  Administered Date(s) Administered  . Influenza Whole 05/12/2006, 03/15/2007  . Influenza, High Dose Seasonal PF 02/22/2015, 02/25/2016  . Influenza, Seasonal, Injecte, Preservative Fre 05/28/2012  . Influenza,inj,Quad PF,6+ Mos 05/12/2012, 04/25/2013, 06/01/2014  . Pneumococcal Conjugate-13 10/26/2013  . Pneumococcal Polysaccharide-23 05/28/2012  . Td 08/17/2006   Screening Tests Health Maintenance  Topic Date Due  . TETANUS/TDAP  08/16/2016  . INFLUENZA VACCINE  12/10/2016  . PNA vac Low Risk Adult (2 of 2 - PPSV23) 05/28/2017  . HEMOGLOBIN A1C  08/24/2017  . MAMMOGRAM  10/29/2017  . OPHTHALMOLOGY EXAM  11/14/2017  . FOOT EXAM  02/23/2018  . COLONOSCOPY  08/01/2018  . DEXA SCAN  Completed  . Hepatitis C Screening  Completed      Plan:   You received your flu shot today.  Follow up with Dr.Paz today as scheduled  Continue to eat heart healthy diet (full of fruits, vegetables, whole grains, lean protein, water--limit salt, fat, and sugar intake) and increase physical activity as tolerated.  Continue doing  brain stimulating activities (puzzles, reading, adult coloring books, staying active) to keep memory sharp.     I have personally reviewed and noted the following in the patient's chart:   . Medical and social history . Use of alcohol, tobacco or illicit drugs  . Current medications and supplements . Functional ability and status . Nutritional status . Physical activity . Advanced directives . List of other physicians . Hospitalizations, surgeries, and ER visits in previous 12 months . Vitals . Screenings to include cognitive, depression, and falls . Referrals and appointments  In addition, I have reviewed and discussed with patient certain preventive protocols, quality metrics, and best practice recommendations. A written personalized care plan for preventive services as well as general preventive health recommendations were provided to patient.     Naaman Plummer [place], [place]  03/31/2017  Kathlene November, MD

## 2017-03-31 ENCOUNTER — Encounter: Payer: Self-pay | Admitting: Internal Medicine

## 2017-03-31 ENCOUNTER — Ambulatory Visit (INDEPENDENT_AMBULATORY_CARE_PROVIDER_SITE_OTHER): Payer: Medicare Other | Admitting: Internal Medicine

## 2017-03-31 VITALS — BP 140/76 | HR 92 | Ht 69.0 in | Wt 311.6 lb

## 2017-03-31 DIAGNOSIS — Z0000 Encounter for general adult medical examination without abnormal findings: Secondary | ICD-10-CM

## 2017-03-31 DIAGNOSIS — Z01419 Encounter for gynecological examination (general) (routine) without abnormal findings: Secondary | ICD-10-CM

## 2017-03-31 DIAGNOSIS — Z23 Encounter for immunization: Secondary | ICD-10-CM

## 2017-03-31 LAB — COMPREHENSIVE METABOLIC PANEL
ALK PHOS: 83 U/L (ref 39–117)
ALT: 16 U/L (ref 0–35)
AST: 17 U/L (ref 0–37)
Albumin: 3.9 g/dL (ref 3.5–5.2)
BILIRUBIN TOTAL: 0.4 mg/dL (ref 0.2–1.2)
BUN: 14 mg/dL (ref 6–23)
CHLORIDE: 100 meq/L (ref 96–112)
CO2: 29 meq/L (ref 19–32)
Calcium: 9.5 mg/dL (ref 8.4–10.5)
Creatinine, Ser: 1.01 mg/dL (ref 0.40–1.20)
GFR: 70.1 mL/min (ref >60.00)
GLUCOSE: 223 mg/dL — AB (ref 70–99)
Potassium: 3.8 mEq/L (ref 3.5–5.1)
Sodium: 136 mEq/L (ref 135–145)
Total Protein: 8.2 g/dL (ref 6.0–8.3)

## 2017-03-31 NOTE — Patient Instructions (Addendum)
GO TO THE LAB : Get the blood work     [name] Scheduled blood work to be done 1 month from today (TSH)  Schedule your next appointment for a routine checkup in 6 months       You received your flu shot today.  Continue to eat heart healthy diet (full of fruits, vegetables, whole grains, lean protein, water--limit salt, fat, and sugar intake) and increase physical activity as tolerated.  Continue doing brain stimulating activities (puzzles, reading, adult coloring books, staying active) to keep memory sharp.    [name] , Thank you for taking time to come for your Medicare Wellness Visit. I appreciate your ongoing commitment to your health goals. Please review the following plan we discussed and let me know if I can assist you in the future.   These are the goals we discussed: Goals    . Increase physical activity (pt-stated)     Walk the hall 3x/week.       This is a list of the screening recommended for you and due dates:  Health Maintenance  Topic Date Due  . Tetanus Vaccine  08/16/2016  . Flu Shot  12/10/2016  . Pneumonia vaccines (2 of 2 - PPSV23) 05/28/2017  . Hemoglobin A1C  08/24/2017  . Mammogram  10/29/2017  . Eye exam for diabetics  11/14/2017  . Complete foot exam   02/23/2018  . Colon Cancer Screening  08/01/2018  . DEXA scan (bone density measurement)  Completed  .  Hepatitis C: One time screening is recommended by Center for Disease Control  (CDC) for  adults born from [birth year] through 1967.   Completed    Health Maintenance for Postmenopausal Women Menopause is a normal process in which your reproductive ability comes to an end. This process happens gradually over a span of months to years, usually between the ages of 62 and 29. Menopause is complete when you have missed 12 consecutive menstrual periods. It is important to talk with your health care provider about some of the most common conditions that affect postmenopausal women, such as  heart disease, cancer, and bone loss (osteoporosis). Adopting a healthy lifestyle and getting preventive care can help to promote your health and wellness. Those actions can also lower your chances of developing some of these common conditions. What should I know about menopause? During menopause, you may experience a number of symptoms, such as:  Moderate-to-severe hot flashes.  Night sweats.  Decrease in sex drive.  Mood swings.  Headaches.  Tiredness.  Irritability.  Memory problems.  Insomnia.  Choosing to treat or not to treat menopausal changes is an individual decision that you make with your health care provider. What should I know about hormone replacement therapy and supplements? Hormone therapy products are effective for treating symptoms that are associated with menopause, such as hot flashes and night sweats. Hormone replacement carries certain risks, especially as you become older. If you are thinking about using estrogen or estrogen with progestin treatments, discuss the benefits and risks with your health care provider. What should I know about heart disease and stroke? Heart disease, heart attack, and stroke become more likely as you age. This may be due, in part, to the hormonal changes that your body experiences during menopause. These can affect how your body processes dietary fats, triglycerides, and cholesterol. Heart attack and stroke are both medical emergencies. There are many things that you can do to help prevent heart disease and stroke:  Have your blood pressure checked at least every 1-2 years. High blood pressure causes heart disease and increases the risk of stroke.  If you are 50-38 years old, ask your health care provider if you should take aspirin to prevent a heart attack or a stroke.  Do not use any tobacco products, including cigarettes, chewing tobacco, or electronic cigarettes. If you need help quitting, ask your health care provider.  It is  important to eat a healthy diet and maintain a healthy weight. ? Be sure to include plenty of vegetables, fruits, low-fat dairy products, and lean protein. ? Avoid eating foods that are high in solid fats, added sugars, or salt (sodium).  Get regular exercise. This is one of the most important things that you can do for your health. ? Try to exercise for at least 150 minutes each week. The type of exercise that you do should increase your heart rate and make you sweat. This is known as moderate-intensity exercise. ? Try to do strengthening exercises at least twice each week. Do these in addition to the moderate-intensity exercise.  Know your numbers.Ask your health care provider to check your cholesterol and your blood glucose. Continue to have your blood tested as directed by your health care provider.  What should I know about cancer screening? There are several types of cancer. Take the following steps to reduce your risk and to catch any cancer development as early as possible. Breast Cancer  Practice breast self-awareness. ? This means understanding how your breasts normally appear and feel. ? It also means doing regular breast self-exams. Let your health care provider know about any changes, no matter how small.  If you are 45 or older, have a clinician do a breast exam (clinical breast exam or CBE) every year. Depending on your age, family history, and medical history, it may be recommended that you also have a yearly breast X-ray (mammogram).  If you have a family history of breast cancer, talk with your health care provider about genetic screening.  If you are at high risk for breast cancer, talk with your health care provider about having an MRI and a mammogram every year.  Breast cancer (BRCA) gene test is recommended for women who have family members with BRCA-related cancers. Results of the assessment will determine the need for genetic counseling and BRCA1 and for BRCA2  testing. BRCA-related cancers include these types: ? Breast. This occurs in males or females. ? Ovarian. ? Tubal. This may also be called fallopian tube cancer. ? Cancer of the abdominal or pelvic lining (peritoneal cancer). ? Prostate. ? Pancreatic.  Cervical, Uterine, and Ovarian Cancer Your health care provider may recommend that you be screened regularly for cancer of the pelvic organs. These include your ovaries, uterus, and vagina. This screening involves a pelvic exam, which includes checking for microscopic changes to the surface of your cervix (Pap test).  For women ages 21-65, health care providers may recommend a pelvic exam and a Pap test every three years. For women ages 51-65, they may recommend the Pap test and pelvic exam, combined with testing for human papilloma virus (HPV), every five years. Some types of HPV increase your risk of cervical cancer. Testing for HPV may also be done on women of any age who have unclear Pap test results.  Other health care providers may not recommend any screening for nonpregnant women who are considered low risk for pelvic cancer and have no symptoms. Ask your health care provider if  a screening pelvic exam is right for you.  If you have had past treatment for cervical cancer or a condition that could lead to cancer, you need Pap tests and screening for cancer for at least 20 years after your treatment. If Pap tests have been discontinued for you, your risk factors (such as having a new sexual partner) need to be reassessed to determine if you should start having screenings again. Some women have medical problems that increase the chance of getting cervical cancer. In these cases, your health care provider may recommend that you have screening and Pap tests more often.  If you have a family history of uterine cancer or ovarian cancer, talk with your health care provider about genetic screening.  If you have vaginal bleeding after reaching  menopause, tell your health care provider.  There are currently no reliable tests available to screen for ovarian cancer.  Lung Cancer Lung cancer screening is recommended for adults 52-10 years old who are at high risk for lung cancer because of a history of smoking. A yearly low-dose CT scan of the lungs is recommended if you:  Currently smoke.  Have a history of at least 30 pack-years of smoking and you currently smoke or have quit within the past 15 years. A pack-year is smoking an average of one pack of cigarettes per day for one year.  Yearly screening should:  Continue until it has been 15 years since you quit.  Stop if you develop a health problem that would prevent you from having lung cancer treatment.  Colorectal Cancer  This type of cancer can be detected and can often be prevented.  Routine colorectal cancer screening usually begins at age 13 and continues through age 23.  If you have risk factors for colon cancer, your health care provider may recommend that you be screened at an earlier age.  If you have a family history of colorectal cancer, talk with your health care provider about genetic screening.  Your health care provider may also recommend using home test kits to check for hidden blood in your stool.  A small camera at the end of a tube can be used to examine your colon directly (sigmoidoscopy or colonoscopy). This is done to check for the earliest forms of colorectal cancer.  Direct examination of the colon should be repeated every 5-10 years until age 35. However, if early forms of precancerous polyps or small growths are found or if you have a family history or genetic risk for colorectal cancer, you may need to be screened more often.  Skin Cancer  Check your skin from head to toe regularly.  Monitor any moles. Be sure to tell your health care provider: ? About any new moles or changes in moles, especially if there is a change in a mole's shape or  color. ? If you have a mole that is larger than the size of a pencil eraser.  If any of your family members has a history of skin cancer, especially at a young age, talk with your health care provider about genetic screening.  Always use sunscreen. Apply sunscreen liberally and repeatedly throughout the day.  Whenever you are outside, protect yourself by wearing long sleeves, pants, a wide-brimmed hat, and sunglasses.  What should I know about osteoporosis? Osteoporosis is a condition in which bone destruction happens more quickly than new bone creation. After menopause, you may be at an increased risk for osteoporosis. To help prevent osteoporosis or the bone fractures  that can happen because of osteoporosis, the following is recommended:  If you are 42-24 years old, get at least 1,000 mg of calcium and at least 600 mg of vitamin D per day.  If you are older than age 12 but younger than age 35, get at least 1,200 mg of calcium and at least 600 mg of vitamin D per day.  If you are older than age 32, get at least 1,200 mg of calcium and at least 800 mg of vitamin D per day.  Smoking and excessive alcohol intake increase the risk of osteoporosis. Eat foods that are rich in calcium and vitamin D, and do weight-bearing exercises several times each week as directed by your health care provider. What should I know about how menopause affects my mental health? Depression may occur at any age, but it is more common as you become older. Common symptoms of depression include:  Low or sad mood.  Changes in sleep patterns.  Changes in appetite or eating patterns.  Feeling an overall lack of motivation or enjoyment of activities that you previously enjoyed.  Frequent crying spells.  Talk with your health care provider if you think that you are experiencing depression. What should I know about immunizations? It is important that you get and maintain your immunizations. These include:  Tetanus,  diphtheria, and pertussis (Tdap) booster vaccine.  Influenza every year before the flu season begins.  Pneumonia vaccine.  Shingles vaccine.  Your health care provider may also recommend other immunizations. This information is not intended to replace advice given to you by your health care provider. Make sure you discuss any questions you have with your health care provider. Document Released: 06/20/2005 Document Revised: 11/16/2015 Document Reviewed: 01/30/2015 Elsevier Interactive Patient Education  2018 [employer].

## 2017-03-31 NOTE — Progress Notes (Signed)
Subjective:    Patient ID: [name], female    DOB: 11-23-1950, 66 y.o.   MRN: [id number]  DOS:  03/31/2017 Type of visit - description : cpx Interval history: Labs and other MDs notes reviewed   Review of Systems No new symptoms: Continue with on and off chest pain, DOE. Continue with back pain Doing well emotionally for the most  part but occasionally gets somewhat depressed. Has been taking a lower dose of insulin than what is prescribed  Other than above, a 14 point review of systems is negative   Past Medical History:  Diagnosis Date  . Anterolisthesis    Grade 1, L4-5  . Bronchospasm 05/28/2012  . CHEST PAIN 11/18/2007  . DEGENERATIVE JOINT DISEASE 10/06/2006  . DEPRESSION 09/26/2008  . DIABETES MELLITUS 10/06/2006  . Diverticulosis    4098,1191  . GERD (gastroesophageal reflux disease) 07/25/2013  . HYPERLIPIDEMIA 01/11/2009  . HYPERTENSION 10/06/2006  . INSOMNIA 09/26/2008  . Internal hemorrhoids   . OBSTRUCTIVE SLEEP APNEA 06/23/2008   Severe OSA per sleep study 2010, Rx a CPAP  . Pain in joint, multiple sites 11/10/2006  . UNSPECIFIED ANEMIA 12/10/2009    Past Surgical History:  Procedure Laterality Date  . COLONOSCOPY  08/01/2011   Procedure: COLONOSCOPY;  Surgeon: Inda Castle, MD;  Location: [facility];  Service: Endoscopy;  Laterality: N/A;  . 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 Right knee replacement  2005    Social History   Socioeconomic History  . Marital status: Widowed    Spouse name: Not on file  . Number of children: 4  . Years of education: Not on file  . Highest education level: Not on file  Social Needs  . Financial resource strain: Not on file  . Food insecurity - worry: Not on file  . Food insecurity - inability: Not on file  . Transportation needs - medical: Not on file  . Transportation needs - non-medical: Not on file  Occupational History  . Occupation: disability    Employer: UNEMPLOYED  Tobacco Use  . Smoking status:  Former Smoker    Packs/day: 0.20    Years: 4.00    Pack years: 0.80    Types: Cigarettes    Last attempt to quit: 05/13/1995    Years since quitting: 21.8  . Smokeless tobacco: Never Used  Substance and Sexual Activity  . Alcohol use: Yes    Comment: brandy once per wk  . Drug use: No  . Sexual activity: Yes  Other Topics Concern  . Not on file  Social History Narrative   Widow , lives by herself   Lost a son, 3 living    Lost husband     Family History  Problem Relation Age of Onset  . Asthma Mother   . Stroke Mother   . Diabetes Other        M, B, S  . Hypertension Sister        M, S,B  . Pancreatic cancer Brother   . Colon cancer Neg Hx   . Prostate cancer Neg Hx   . Breast cancer Neg Hx      Allergies as of 03/31/2017   No Known Allergies     Medication List        Accurate as of 03/31/17  5:58 PM. Always use your most recent med list.          ACCU-CHEK AVIVA PLUS w/Device Kit 1 Device by Does not apply  route once.   ACCU-CHEK SOFTCLIX LANCETS lancets USE TO CHECK BLOOD SUGAR 2 TIMES DAILY   albuterol 108 (90 Base) MCG/ACT inhaler Commonly known as:  PROVENTIL HFA;VENTOLIN HFA Inhale 2 puffs into the lungs every 4 (four) hours as needed for wheezing.   aspirin EC 81 MG tablet Take 81 mg by mouth daily.   BD ULTRA-FINE PEN NEEDLES 29G X 12.7MM Misc Generic drug:  Insulin Pen Needle USE AS DIRECTED 3 TIMES A DAY   budesonide-formoterol 160-4.5 MCG/ACT inhaler Commonly known as:  SYMBICORT Inhale 2 puffs into the lungs 2 (two) times daily.   diltiazem 360 MG 24 hr capsule Commonly known as:  TAZTIA XT Take 1 capsule (360 mg total) by mouth daily.   glucose blood test strip Commonly known as:  ACCU-CHEK AVIVA PLUS 1 each by Other route 2 (two) times daily. And lancets 2/day   Insulin Lispro Prot & Lispro (50-50) 100 UNIT/ML Kwikpen Commonly known as:  HUMALOG MIX 50/50 KWIKPEN Inject 60 Units into the skin 2 (two) times daily with a meal.  And pen needles 2/day   latanoprost 0.005 % ophthalmic solution Commonly known as:  XALATAN INT 1 GTT INTO OU HS   levothyroxine 50 MCG tablet Commonly known as:  SYNTHROID, LEVOTHROID Take 1 tablet (50 mcg total) by mouth daily before breakfast.   potassium chloride 10 MEQ tablet Commonly known as:  K-DUR,KLOR-CON Take 1 tablet (10 mEq total) by mouth daily.   rosuvastatin 40 MG tablet Commonly known as:  CRESTOR Take 1 tablet (40 mg total) by mouth daily.   telmisartan 40 MG tablet Commonly known as:  MICARDIS Take 1 tablet (40 mg total) by mouth daily.          Objective:   Physical Exam BP 140/76 (BP Location: Left Arm, Patient Position: Sitting, Cuff Size: Large)   Pulse 92   Ht _0  (1.753 m)   Wt (!) 311 lb 9.6 oz (141.3 kg)   SpO2 99%   BMI 46.02 kg/m  General:   Well developed, morbidly obese appearing. NAD.  HEENT:  Normocephalic . Face symmetric, atraumatic Lungs:  CTA B Normal respiratory effort, no intercostal retractions, no accessory muscle use. Heart: RRR,  no murmur.  No pretibial edema bilaterally  Abdomen:  Not distended, soft, slightly TTP at the epigastric area without mass or rebound.   Skin: Exposed areas without rash. Not pale. Not jaundice Neurologic:  alert & oriented X3.  Speech normal, gait appropriate for age and unassisted Strength symmetric and appropriate for age.  Psych: Cognition and judgment appear intact.  Cooperative with normal attention span and concentration.  Behavior appropriate. No anxious or depressed appearing.     Assessment & Plan:   Assessment DM per endocrinology HTN Hyperlipidemia For this in the next 2018 Depression, insomnia (citalopram caused headache ? 05-2014) MSK: --DJD, saw ortho ~06-2014 --Multiple joint pains, 2008, sedimentation rate, RA >> negative --Back pain>> -- MRI 2007: Epidural lipomatosis, congenitally short pedicles, spondylosis, and disc bulges and protrusions contribute to  central, foraminal, and subarticular lateral recess stenoses as detailed above.  --2008, saw Ortho, Rx local injections  --local injections back Dr Mina Marble  ~ 12-2015 GERD Morbid obesity Pulmonary -Chronic respiratory failure  -asthma -OSA dx 2010, severe, nocturnal desaturation, intolerant to CPAP, on nocturnal O2 -ONO 01/2017:  + Hypoxia at night, rec  to continue oxygen at night unless able  to tolerate a CPAP which she cannot DOE, chronic CP, chronic RUQ pain:: 2004--Cath neg, areterial LE  dopplers (-) 06-2003-- abd. u/s fatty liver 01-2005-- CP...neg u/s GB, neg HIDA 10-06--saw cards--cardiolite neg 12-2007 abnormal stress test Cath 02-01-08: normal coronaries  chest pain, stress test echo okay 09/2016   PLAN: HTN: No ambulatory BPs recently.  BP today is very good, continue diltiazem, Micardis, potassium.  Check a CMP Hyperlipidemia: Last LDL satisfactory, continue Crestor. DM: Per endo. Hypothyroidism: Good compliance with Synthroid only for the last 3 weeks, check a TSH in 1 month. Respiratory failure , asthma: Continue inhalers as needed, nocturnal hypoxia per last ONO: ideal treatment is CPAP but if unable to tolerate, was rec to continue oxygen supplement at night Depression, insomnia: Doing relatively okay, on no medications. CP, DOE: Unchanged. RTC 6 months

## 2017-03-31 NOTE — Assessment & Plan Note (Signed)
HTN: No ambulatory BPs recently.  BP today is very good, continue diltiazem, Micardis, potassium.  Check a CMP Hyperlipidemia: Last LDL satisfactory, continue Crestor. DM: Per endo. Hypothyroidism: Good compliance with Synthroid only for the last 3 weeks, check a TSH in 1 month. Respiratory failure , asthma: Continue inhalers as needed, nocturnal hypoxia per last ONO: ideal treatment is CPAP but if unable to tolerate, was rec to continue oxygen supplement at night Depression, insomnia: Doing relatively okay, on no medications. CP, DOE: Unchanged. RTC 6 months

## 2017-03-31 NOTE — Assessment & Plan Note (Addendum)
-  Td 2018;  PNM 23 1-14;  prevnar  10/2013; shingrix discussed. Flu shot today -Female care: PAP   last 12-2011 (-), referral to gynecology fail, will try again   MMG 10/2016 (-) -CCS: Cscope 2013, next 10 years -DEXA (-) 2010 and 11-2013 -Diet and exercise discussed -Labs: CMP, TSH

## 2017-04-01 ENCOUNTER — Ambulatory Visit: Payer: Medicare Other | Admitting: *Deleted

## 2017-04-06 ENCOUNTER — Other Ambulatory Visit: Payer: Self-pay | Admitting: Internal Medicine

## 2017-04-11 DIAGNOSIS — G4733 Obstructive sleep apnea (adult) (pediatric): Secondary | ICD-10-CM | POA: Diagnosis not present

## 2017-04-11 DIAGNOSIS — R0902 Hypoxemia: Secondary | ICD-10-CM | POA: Diagnosis not present

## 2017-04-29 ENCOUNTER — Other Ambulatory Visit: Payer: Self-pay | Admitting: Internal Medicine

## 2017-05-12 DIAGNOSIS — G4733 Obstructive sleep apnea (adult) (pediatric): Secondary | ICD-10-CM | POA: Diagnosis not present

## 2017-05-12 DIAGNOSIS — R0902 Hypoxemia: Secondary | ICD-10-CM | POA: Diagnosis not present

## 2017-05-14 DIAGNOSIS — E1351 Other specified diabetes mellitus with diabetic peripheral angiopathy without gangrene: Secondary | ICD-10-CM | POA: Diagnosis not present

## 2017-05-14 DIAGNOSIS — L84 Corns and callosities: Secondary | ICD-10-CM | POA: Diagnosis not present

## 2017-05-14 DIAGNOSIS — L602 Onychogryphosis: Secondary | ICD-10-CM | POA: Diagnosis not present

## 2017-05-22 ENCOUNTER — Telehealth: Payer: Self-pay | Admitting: Internal Medicine

## 2017-05-22 NOTE — Telephone Encounter (Signed)
Spoke w/ Pt, she has been compliant with medications. Informed her she is due for TSH- she has appt w/ Dr. Loanne Drilling on 05/26/2017- she will ask him to have TSH drawn if possible because she does not drive.

## 2017-05-22 NOTE — Telephone Encounter (Signed)
Noted, thx.

## 2017-05-22 NOTE — Telephone Encounter (Signed)
Due for a TSH, please arrange.  Also ask about thyroid medication compliance.

## 2017-05-26 ENCOUNTER — Ambulatory Visit (INDEPENDENT_AMBULATORY_CARE_PROVIDER_SITE_OTHER): Payer: Medicare Other | Admitting: Endocrinology

## 2017-05-26 ENCOUNTER — Encounter: Payer: Self-pay | Admitting: Endocrinology

## 2017-05-26 VITALS — BP 170/108 | HR 111 | Wt 307.8 lb

## 2017-05-26 DIAGNOSIS — E1042 Type 1 diabetes mellitus with diabetic polyneuropathy: Secondary | ICD-10-CM

## 2017-05-26 LAB — POCT GLYCOSYLATED HEMOGLOBIN (HGB A1C): Hemoglobin A1C: 8.5

## 2017-05-26 MED ORDER — INSULIN LISPRO PROT & LISPRO (50-50 MIX) 100 UNIT/ML KWIKPEN: 65.0000 [IU] | PEN_INJECTOR | Freq: Two times a day (BID) | SUBCUTANEOUS | 11 refills | Status: DC

## 2017-05-26 NOTE — Progress Notes (Signed)
Subjective:    Patient ID: [name], female    DOB: February 22, 1951, 67 y.o.   MRN: [id number]  HPI Pt returns for f/u of diabetes mellitus: DM type: Insulin-requiring type 2 Dx'ed: 6789 Complications: polyneuropathy and renal insufficiency.   Therapy: insulin since 2004.  GDM: never.  DKA: never. Severe hypoglycemia: never.  Pancreatitis: never.  Other: she declines weight-loss surgery; she did not achieve good glycemic control with multiple daily injections, but she has done better with a BID insulin regimen.  She has takes less insulin on Sunday mornings, due to risk hypoglycemia then.   Interval history: she brings a record of her cbg's which I have reviewed today.  It varies from 105-236.  There is no trend throughout the day. She says she never misses the insulin, but she still only takes 50 units bid.  No recent steroids.   Past Medical History:  Diagnosis Date  . Anterolisthesis    Grade 1, L4-5  . Bronchospasm 05/28/2012  . CHEST PAIN 11/18/2007  . DEGENERATIVE JOINT DISEASE 10/06/2006  . DEPRESSION 09/26/2008  . DIABETES MELLITUS 10/06/2006  . Diverticulosis    3810,1751  . GERD (gastroesophageal reflux disease) 07/25/2013  . HYPERLIPIDEMIA 01/11/2009  . HYPERTENSION 10/06/2006  . INSOMNIA 09/26/2008  . Internal hemorrhoids   . OBSTRUCTIVE SLEEP APNEA 06/23/2008   Severe OSA per sleep study 2010, Rx a CPAP  . Pain in joint, multiple sites 11/10/2006  . UNSPECIFIED ANEMIA 12/10/2009    Past Surgical History:  Procedure Laterality Date  . COLONOSCOPY  08/01/2011   Procedure: COLONOSCOPY;  Surgeon: Inda Castle, MD;  Location: [facility];  Service: Endoscopy;  Laterality: N/A;  . 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 Right knee replacement  2005    Social History   Socioeconomic History  . Marital status: Widowed    Spouse name: Not on file  . Number of children: 4  . Years of education: Not on file  . Highest education level: Not on file  Social Needs  . Financial  resource strain: Not on file  . Food insecurity - worry: Not on file  . Food insecurity - inability: Not on file  . Transportation needs - medical: Not on file  . Transportation needs - non-medical: Not on file  Occupational History  . Occupation: disability    Employer: UNEMPLOYED  Tobacco Use  . Smoking status: Former Smoker    Packs/day: 0.20    Years: 4.00    Pack years: 0.80    Types: Cigarettes    Last attempt to quit: 05/13/1995    Years since quitting: 22.0  . Smokeless tobacco: Never Used  Substance and Sexual Activity  . Alcohol use: Yes    Comment: brandy once per wk  . Drug use: No  . Sexual activity: Yes  Other Topics Concern  . Not on file  Social History Narrative   Widow , lives by herself   Lost a son, 3 living    Lost husband    Current Outpatient Medications on File Prior to Visit  Medication Sig Dispense Refill  . ACCU-CHEK SOFTCLIX LANCETS lancets USE TO CHECK BLOOD SUGAR 2 TIMES DAILY 100 each 12  . aspirin EC 81 MG tablet Take 81 mg by mouth daily.    . BD ULTRA-FINE PEN NEEDLES 29G X 12.7MM MISC USE AS DIRECTED 3 TIMES A DAY 100 each 3  . Blood Glucose Monitoring Suppl (ACCU-CHEK AVIVA PLUS) W/DEVICE KIT 1 Device by Does not  apply route once. 1 kit 0  . budesonide-formoterol (SYMBICORT) 160-4.5 MCG/ACT inhaler Inhale 2 puffs into the lungs 2 (two) times daily. 10.2 g 5  . diltiazem (CARDIZEM CD) 360 MG 24 hr capsule Take 1 capsule (360 mg total) by mouth daily. 90 capsule 1  . diltiazem (TAZTIA XT) 360 MG 24 hr capsule Take 1 capsule (360 mg total) by mouth daily. 90 capsule 1  . glucose blood (ACCU-CHEK AVIVA PLUS) test strip 1 each by Other route 2 (two) times daily. And lancets 2/day 200 each 2  . latanoprost (XALATAN) 0.005 % ophthalmic solution INT 1 GTT INTO OU HS  10  . levothyroxine (SYNTHROID, LEVOTHROID) 50 MCG tablet Take 1 tablet (50 mcg total) by mouth daily before breakfast. 30 tablet 3  . potassium chloride (K-DUR,KLOR-CON) 10 MEQ tablet  Take 1 tablet (10 mEq total) by mouth daily. 90 tablet 1  . rosuvastatin (CRESTOR) 40 MG tablet Take 1 tablet (40 mg total) by mouth daily. 90 tablet 1  . telmisartan (MICARDIS) 40 MG tablet Take 1 tablet (40 mg total) by mouth daily. 90 tablet 0  . albuterol (PROVENTIL HFA;VENTOLIN HFA) 108 (90 BASE) MCG/ACT inhaler Inhale 2 puffs into the lungs every 4 (four) hours as needed for wheezing. (Patient not taking: Reported on 03/31/2017) 1 Inhaler 6   No current facility-administered medications on file prior to visit.     No Known Allergies  Family History  Problem Relation Age of Onset  . Asthma Mother   . Stroke Mother   . Diabetes Other        M, B, S  . Hypertension Sister        M, S,B  . Pancreatic cancer Brother   . Colon cancer Neg Hx   . Prostate cancer Neg Hx   . Breast cancer Neg Hx     BP (!) 170/108 (BP Location: Left Arm, Patient Position: Sitting, Cuff Size: Normal)   Pulse (!) 111   Wt (!) 307 lb 12.8 oz (139.6 kg)   SpO2 96%   BMI 45.45 kg/m   Review of Systems She denies hypoglycemia.      Objective:   Physical Exam VITAL SIGNS:  See vs page GENERAL: no distress Pulses: foot pulses are intact bilaterally.   MSK: no deformity of the feet or ankles.  CV: no leg edema, but there are bilat vv's.    Skin:  no ulcer on the feet or ankles.  normal color and temp on the feet and ankles.  Neuro: sensation is intact to touch on the feet and ankles.   Ext: There is bilateral onychomycosis of the toenails.   A1c=8.5%    Assessment & Plan:  Insulin-requiring type 2 DM, with renal insuff: she needs increased rx.    Patient Instructions  check your blood sugar 2 times a day.  vary the time of day when you check, between before the 3 meals, and at bedtime.  also check if you have symptoms of your blood sugar being too high or too low.  please keep a record of the readings and bring it to your next appointment here.  please call us sooner if your blood sugar goes  below 70, or if it stays over 200.   Please increase the insulin to 65 units twice a day (just before the first and last meals of the day).  However, on Sunday mornings, take just 10 units.   On this type of insulin schedule, you should eat meals  on a regular schedule.  If a meal is missed or significantly delayed, your blood sugar could go low.   Please come back for a follow-up appointment in 2 months.

## 2017-05-26 NOTE — Patient Instructions (Addendum)
check your blood sugar 2 times a day.  vary the time of day when you check, between before the 3 meals, and at bedtime.  also check if you have symptoms of your blood sugar being too high or too low.  please keep a record of the readings and bring it to your next appointment here.  please call us sooner if your blood sugar goes below 70, or if it stays over 200.   Please increase the insulin to 65 units twice a day (just before the first and last meals of the day).  However, on Sunday mornings, take just 10 units.   On this type of insulin schedule, you should eat meals on a regular schedule.  If a meal is missed or significantly delayed, your blood sugar could go low.   Please come back for a follow-up appointment in 2 months.

## 2017-06-08 ENCOUNTER — Other Ambulatory Visit: Payer: Self-pay | Admitting: Internal Medicine

## 2017-06-12 DIAGNOSIS — G4733 Obstructive sleep apnea (adult) (pediatric): Secondary | ICD-10-CM | POA: Diagnosis not present

## 2017-06-12 DIAGNOSIS — R0902 Hypoxemia: Secondary | ICD-10-CM | POA: Diagnosis not present

## 2017-06-15 ENCOUNTER — Telehealth: Payer: Self-pay | Admitting: Endocrinology

## 2017-06-15 NOTE — Telephone Encounter (Signed)
Serafina Mitchell with [facility] ph# [phone] ext. [phone] needs the following info re patient: A1C and the date that goes with A1C AND the last blood pressure with the date that goes with the blood pressure. You can call or fax info. Fax# [phone]

## 2017-06-15 NOTE — Telephone Encounter (Signed)
I have faxed to [facility].

## 2017-07-08 ENCOUNTER — Other Ambulatory Visit: Payer: Self-pay | Admitting: Internal Medicine

## 2017-07-09 ENCOUNTER — Other Ambulatory Visit: Payer: Self-pay | Admitting: Internal Medicine

## 2017-07-10 DIAGNOSIS — R0902 Hypoxemia: Secondary | ICD-10-CM | POA: Diagnosis not present

## 2017-07-10 DIAGNOSIS — G4733 Obstructive sleep apnea (adult) (pediatric): Secondary | ICD-10-CM | POA: Diagnosis not present

## 2017-07-14 DIAGNOSIS — H25813 Combined forms of age-related cataract, bilateral: Secondary | ICD-10-CM | POA: Diagnosis not present

## 2017-07-14 DIAGNOSIS — H401121 Primary open-angle glaucoma, left eye, mild stage: Secondary | ICD-10-CM | POA: Diagnosis not present

## 2017-07-14 DIAGNOSIS — E119 Type 2 diabetes mellitus without complications: Secondary | ICD-10-CM | POA: Diagnosis not present

## 2017-07-14 DIAGNOSIS — H401112 Primary open-angle glaucoma, right eye, moderate stage: Secondary | ICD-10-CM | POA: Diagnosis not present

## 2017-07-23 ENCOUNTER — Ambulatory Visit: Payer: Medicare Other | Admitting: Endocrinology

## 2017-07-27 ENCOUNTER — Encounter (HOSPITAL_COMMUNITY): Payer: Self-pay

## 2017-07-27 ENCOUNTER — Emergency Department (HOSPITAL_COMMUNITY)
Admission: EM | Admit: 2017-07-27 | Discharge: 2017-07-27 | Disposition: A | Discharge status: Home / Self Care | Payer: Medicare Other | Attending: Emergency Medicine | Admitting: Emergency Medicine

## 2017-07-27 ENCOUNTER — Other Ambulatory Visit: Payer: Self-pay

## 2017-07-27 DIAGNOSIS — R040 Epistaxis: Secondary | ICD-10-CM | POA: Diagnosis not present

## 2017-07-27 DIAGNOSIS — Z87891 Personal history of nicotine dependence: Secondary | ICD-10-CM | POA: Diagnosis not present

## 2017-07-27 DIAGNOSIS — Z79899 Other long term (current) drug therapy: Secondary | ICD-10-CM | POA: Insufficient documentation

## 2017-07-27 DIAGNOSIS — J45909 Unspecified asthma, uncomplicated: Secondary | ICD-10-CM | POA: Insufficient documentation

## 2017-07-27 DIAGNOSIS — Z96653 Presence of artificial knee joint, bilateral: Secondary | ICD-10-CM | POA: Insufficient documentation

## 2017-07-27 DIAGNOSIS — E119 Type 2 diabetes mellitus without complications: Secondary | ICD-10-CM | POA: Insufficient documentation

## 2017-07-27 DIAGNOSIS — I10 Essential (primary) hypertension: Secondary | ICD-10-CM | POA: Insufficient documentation

## 2017-07-27 DIAGNOSIS — Z7982 Long term (current) use of aspirin: Secondary | ICD-10-CM | POA: Diagnosis not present

## 2017-07-27 NOTE — ED Triage Notes (Signed)
Pt endorses nosebleed that began at 1210 today and stopped at 1230. No bleeding present at this time. Pt has no other complaints. VSS.

## 2017-07-27 NOTE — Discharge Instructions (Signed)
Please follow-up with your primary care provider at your scheduled appointment next week.  Please return to the emergency department if you develop any new or worsening symptoms including nosebleed lasting longer than 30 minutes, severe chest pain, shortness of breath, lightheadedness, or passing out.

## 2017-07-27 NOTE — ED Provider Notes (Signed)
[place] EMERGENCY DEPARTMENT Provider Note   CSN: [id number] Arrival date & time: 07/27/17  1351     History   Chief Complaint Chief Complaint  Patient presents with  . Epistaxis    HPI [name] is a 67 y.o. female with history of diabetes, hypertension who presents following an episode of right-sided epistaxis that began around 12:10 PM today and resolved by 12:30 PM.  She reports she used to get nosebleeds quite often, however she has not gotten any in several months.  She reports potentially itching her nose last evening.  She is also been using the heat at her home.  She reports she is not able to use any nose sprays, as these make her nosebleed further.  Bleeding is controlled now and has been since she arrived. She has an appointment with her doctor in 1 week.  She denies any headaches, lightheadedness, or dizziness.  HPI  Past Medical History:  Diagnosis Date  . Anterolisthesis    Grade 1, L4-5  . Bronchospasm 05/28/2012  . CHEST PAIN 11/18/2007  . DEGENERATIVE JOINT DISEASE 10/06/2006  . DEPRESSION 09/26/2008  . DIABETES MELLITUS 10/06/2006  . Diverticulosis    [id number]  . GERD (gastroesophageal reflux disease) 07/25/2013  . HYPERLIPIDEMIA 01/11/2009  . HYPERTENSION 10/06/2006  . INSOMNIA 09/26/2008  . Internal hemorrhoids   . OBSTRUCTIVE SLEEP APNEA 06/23/2008   Severe OSA per sleep study 2010, Rx a CPAP  . Pain in joint, multiple sites 11/10/2006  . UNSPECIFIED ANEMIA 12/10/2009    Patient Active Problem List   Diagnosis Date Noted  . PCP NOTES >>> 02/23/2015  . Nocturnal oxygen desaturation 01/02/2015  . Morbid obesity ([place]) 01/02/2015  . Asthma, chronic 01/02/2015  . GERD (gastroesophageal reflux disease) 07/25/2013  . DOE (dyspnea on exertion) 04/25/2013  . 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 Bronchospasm 05/28/2012  . Internal hemorrhoids without mention of complication 56/43/3295  . Annual  physical exam 06/06/2011  . SKIN LESION 08/06/2009  . Hyperlipidemia 01/11/2009  . Depression 09/26/2008  . INSOMNIA 09/26/2008  . Obstructive sleep apnea 06/23/2008  . Chest pain 11/18/2007  . DM II (diabetes mellitus, type II), controlled ([place]) 10/06/2006  . Essential hypertension 10/06/2006  . Osteoarthritis 10/06/2006    Past Surgical History:  Procedure Laterality Date  . COLONOSCOPY  08/01/2011   Procedure: COLONOSCOPY;  Surgeon: Inda Castle, MD;  Location: [facility];  Service: Endoscopy;  Laterality: N/A;  . 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 Right knee replacement  2005    OB History    No data available       Home Medications    Prior to Admission medications   Medication Sig Start Date End Date Taking? Authorizing Provider  ACCU-CHEK SOFTCLIX LANCETS lancets USE TO CHECK BLOOD SUGAR 2 TIMES DAILY 07/13/16   Renato Shin, MD  albuterol (PROVENTIL HFA;VENTOLIN HFA) 108 (90 BASE) MCG/ACT inhaler Inhale 2 puffs into the lungs every 4 (four) hours as needed for wheezing. Patient not taking: Reported on 03/31/2017 06/01/14 12/30/16  Colon Branch, MD  aspirin EC 81 MG tablet Take 81 mg by mouth daily.    [provider]  BD ULTRA-FINE PEN NEEDLES 29G X 12.7MM MISC USE AS DIRECTED 3 TIMES A DAY 06/01/16   Renato Shin, MD  Blood Glucose Monitoring Suppl (ACCU-CHEK AVIVA PLUS) W/DEVICE KIT 1 Device by Does not apply route once. 06/20/11   Renato Shin, MD  budesonide-formoterol [facility]) 160-4.5  MCG/ACT inhaler Inhale 2 puffs into the lungs 2 (two) times daily. 09/01/14   Colon Branch, MD  diltiazem (CARDIZEM CD) 360 MG 24 hr capsule Take 1 capsule (360 mg total) by mouth daily. 04/06/17   Colon Branch, MD  diltiazem (TAZTIA XT) 360 MG 24 hr capsule Take 1 capsule (360 mg total) by mouth daily. 07/29/16   Colon Branch, MD  glucose blood (ACCU-CHEK AVIVA PLUS) test strip 1 each by Other route 2 (two) times daily. And lancets 2/day 12/23/16   Renato Shin, MD  Insulin  Lispro Prot & Lispro (HUMALOG MIX 50/50 KWIKPEN) (50-50) 100 UNIT/ML Kwikpen Inject 65 Units into the skin 2 (two) times daily with a meal. And pen needles 2/day 05/26/17   Renato Shin, MD  latanoprost (XALATAN) 0.005 % ophthalmic solution INT 1 GTT INTO OU HS 12/09/16   [provider]  levothyroxine (SYNTHROID, LEVOTHROID) 50 MCG tablet Take 1 tablet (50 mcg total) by mouth daily before breakfast. 07/09/17   Colon Branch, MD  potassium chloride (K-DUR,KLOR-CON) 10 MEQ tablet Take 1 tablet (10 mEq total) by mouth daily. 04/06/17   Colon Branch, MD  rosuvastatin (CRESTOR) 40 MG tablet Take 1 tablet (40 mg total) by mouth daily. 04/30/17   Colon Branch, MD  telmisartan (MICARDIS) 40 MG tablet Take 1 tablet (40 mg total) by mouth daily. 06/08/17   Colon Branch, MD    Family History Family History  Problem Relation Age of Onset  . Asthma Mother   . Stroke Mother   . Diabetes Other        M, B, S  . Hypertension Sister        M, S,B  . Pancreatic cancer Brother   . Colon cancer Neg Hx   . Prostate cancer Neg Hx   . Breast cancer Neg Hx     Social History Social History   Tobacco Use  . Smoking status: Former Smoker    Packs/day: 0.20    Years: 4.00    Pack years: 0.80    Types: Cigarettes    Last attempt to quit: 05/13/1995    Years since quitting: 22.2  . Smokeless tobacco: Never Used  Substance Use Topics  . Alcohol use: Yes    Comment: brandy once per wk  . Drug use: No     Allergies   Patient has no known allergies.   Review of Systems Review of Systems  HENT: Positive for nosebleeds.   Neurological: Negative for dizziness, light-headedness and headaches.     Physical Exam Updated Vital Signs BP (!) 146/76   Pulse (!) 102   Temp 99.2 F (37.3 C) (Oral)   Resp 19   Ht _0  (1.753 m)   Wt (!) 138.8 kg (306 lb)   SpO2 96%   BMI 45.19 kg/m   Physical Exam  Constitutional: She appears well-developed and well-nourished. No distress.  HENT:  Head:  Normocephalic and atraumatic.  Nose: Epistaxis (scant drying blood in the R nare, otherwise clear, no septal hematoma or clots) is observed.  Mouth/Throat: Oropharynx is clear and moist. No oropharyngeal exudate.  Eyes: Conjunctivae are normal. Pupils are equal, round, and reactive to light. Right eye exhibits no discharge. Left eye exhibits no discharge. No scleral icterus.  Neck: Normal range of motion. Neck supple. No thyromegaly present.  Cardiovascular: Regular rhythm, normal heart sounds and intact distal pulses. Exam reveals no gallop and no friction rub.  No murmur heard. Pulmonary/Chest:  Effort normal and breath sounds normal. No stridor. No respiratory distress. She has no wheezes. She has no rales.  Abdominal: Soft. Bowel sounds are normal. She exhibits no distension. There is no tenderness. There is no rebound and no guarding.  Musculoskeletal: She exhibits no edema.  Lymphadenopathy:    She has no cervical adenopathy.  Neurological: She is alert. Coordination normal.  Skin: Skin is warm and dry. No rash noted. She is not diaphoretic. No pallor.  Psychiatric: She has a normal mood and affect.  Nursing note and vitals reviewed.    ED Treatments / Results  Labs (all labs ordered are listed, but only abnormal results are displayed) Labs Reviewed - No data to display  EKG  EKG Interpretation None       Radiology No results found.  Procedures Procedures (including critical care time)  Medications Ordered in ED Medications - No data to display   Initial Impression / Assessment and Plan / ED Course  I have reviewed the triage vital signs and the nursing notes.  Pertinent labs & imaging results that were available during my care of the patient were reviewed by me and considered in my medical decision making (see chart for details).     Patient with resolved epistaxis.  She has had no bleeding since arrival.  She reports history of epistaxis, however has not had it  in a while.  She reports she has had her heat on it may have itched her nose last evening.  She reports that any nose sprays make her nosebleed, after I offered nasal saline.  She plans to follow-up with her PCP at her scheduled appointment next week.  Return precautions discussed.  Patient understands and agrees with plan.  Patient vitals stable throughout ED course and discharged in satisfactory condition.  Final Clinical Impressions(s) / ED Diagnoses   Final diagnoses:  Right-sided epistaxis    ED Discharge Orders    None       Frederica Kuster, PA-C 07/27/17 1726    Virgel Manifold, MD 07/28/17 1502

## 2017-07-28 ENCOUNTER — Other Ambulatory Visit: Payer: Self-pay

## 2017-07-29 ENCOUNTER — Encounter (HOSPITAL_COMMUNITY): Payer: Self-pay | Admitting: *Deleted

## 2017-07-29 ENCOUNTER — Other Ambulatory Visit: Payer: Self-pay

## 2017-07-29 ENCOUNTER — Emergency Department (HOSPITAL_COMMUNITY)
Admission: EM | Admit: 2017-07-29 | Discharge: 2017-07-30 | Disposition: A | Discharge status: Home / Self Care | Payer: Medicare Other | Attending: Emergency Medicine | Admitting: Emergency Medicine

## 2017-07-29 DIAGNOSIS — Z794 Long term (current) use of insulin: Secondary | ICD-10-CM | POA: Insufficient documentation

## 2017-07-29 DIAGNOSIS — E119 Type 2 diabetes mellitus without complications: Secondary | ICD-10-CM | POA: Diagnosis not present

## 2017-07-29 DIAGNOSIS — Z79899 Other long term (current) drug therapy: Secondary | ICD-10-CM | POA: Insufficient documentation

## 2017-07-29 DIAGNOSIS — Z87891 Personal history of nicotine dependence: Secondary | ICD-10-CM | POA: Insufficient documentation

## 2017-07-29 DIAGNOSIS — R040 Epistaxis: Secondary | ICD-10-CM

## 2017-07-29 DIAGNOSIS — I10 Essential (primary) hypertension: Secondary | ICD-10-CM | POA: Insufficient documentation

## 2017-07-29 LAB — COMPREHENSIVE METABOLIC PANEL
ALK PHOS: 58 U/L (ref 38–126)
ALT: 18 U/L (ref 14–54)
ANION GAP: 10 (ref 5–15)
AST: 26 U/L (ref 15–41)
Albumin: 2.9 g/dL — ABNORMAL LOW (ref 3.5–5.0)
BUN: 15 mg/dL (ref 6–20)
CALCIUM: 8.6 mg/dL — AB (ref 8.9–10.3)
CO2: 23 mmol/L (ref 22–32)
CREATININE: 1.06 mg/dL — AB (ref 0.44–1.00)
Chloride: 103 mmol/L (ref 101–111)
GFR calc non Af Amer: 53 mL/min — ABNORMAL LOW (ref >60)
Glucose, Bld: 103 mg/dL — ABNORMAL HIGH (ref 65–99)
Potassium: 3.7 mmol/L (ref 3.5–5.1)
SODIUM: 136 mmol/L (ref 135–145)
TOTAL PROTEIN: 7.7 g/dL (ref 6.5–8.1)
Total Bilirubin: 0.3 mg/dL (ref 0.3–1.2)

## 2017-07-29 LAB — CBC
HEMATOCRIT: 35.3 % — AB (ref 36.0–46.0)
Hemoglobin: 11.4 g/dL — ABNORMAL LOW (ref 12.0–15.0)
MCH: 26.3 pg (ref 26.0–34.0)
MCHC: 32.3 g/dL (ref 30.0–36.0)
MCV: 81.5 fL (ref 78.0–100.0)
PLATELETS: 401 10*3/uL — AB (ref 150–400)
RBC: 4.33 MIL/uL (ref 3.87–5.11)
RDW: 16 % — AB (ref 11.5–15.5)
WBC: 6.3 10*3/uL (ref 4.0–10.5)

## 2017-07-29 LAB — PROTIME-INR
INR: 1.11
Prothrombin Time: 14.2 seconds (ref 11.4–15.2)

## 2017-07-29 NOTE — ED Triage Notes (Signed)
The pt is c/o a nose bleed since 2000 no bleeding now  She has large blood clots come from her nose and her throat.  She was here on the 18th for thew same.  Recent cold and coughand she sleeps with c-pap at night

## 2017-07-30 NOTE — ED Provider Notes (Signed)
[place] EMERGENCY DEPARTMENT Provider Note   CSN: [id number] Arrival date & time: 07/29/17  2103     History   Chief Complaint Chief Complaint  Patient presents with  . Epistaxis    HPI [name] is a 67 y.o. female.  The history is provided by the patient.  Epistaxis   This is a recurrent problem. The current episode started 6 to 12 hours ago. The problem occurs constantly. The problem has been resolved. The problem is associated with an unknown factor. The bleeding has been from the right nare. She has tried nothing for the symptoms. The treatment provided significant relief. Her past medical history does not include sinus problems.    Past Medical History:  Diagnosis Date  . Anterolisthesis    Grade 1, L4-5  . Bronchospasm 05/28/2012  . CHEST PAIN 11/18/2007  . DEGENERATIVE JOINT DISEASE 10/06/2006  . DEPRESSION 09/26/2008  . DIABETES MELLITUS 10/06/2006  . Diverticulosis    [id number]  . GERD (gastroesophageal reflux disease) 07/25/2013  . HYPERLIPIDEMIA 01/11/2009  . HYPERTENSION 10/06/2006  . INSOMNIA 09/26/2008  . Internal hemorrhoids   . OBSTRUCTIVE SLEEP APNEA 06/23/2008   Severe OSA per sleep study 2010, Rx a CPAP  . Pain in joint, multiple sites 11/10/2006  . UNSPECIFIED ANEMIA 12/10/2009    Patient Active Problem List   Diagnosis Date Noted  . PCP NOTES >>> 02/23/2015  . Nocturnal oxygen desaturation 01/02/2015  . Morbid obesity ([place]) 01/02/2015  . Asthma, chronic 01/02/2015  . GERD (gastroesophageal reflux disease) 07/25/2013  . DOE (dyspnea on exertion) 04/25/2013  . 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 Bronchospasm 05/28/2012  . Internal hemorrhoids without mention of complication 49/70/2637  . Annual physical exam 06/06/2011  . SKIN LESION 08/06/2009  . Hyperlipidemia 01/11/2009  . Depression 09/26/2008  . INSOMNIA 09/26/2008  . Obstructive sleep apnea 06/23/2008  . Chest pain  11/18/2007  . DM II (diabetes mellitus, type II), controlled ([place]) 10/06/2006  . Essential hypertension 10/06/2006  . Osteoarthritis 10/06/2006    Past Surgical History:  Procedure Laterality Date  . COLONOSCOPY  08/01/2011   Procedure: COLONOSCOPY;  Surgeon: Inda Castle, MD;  Location: [facility];  Service: Endoscopy;  Laterality: N/A;  . 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 Right knee replacement  2005    OB History   None      Home Medications    Prior to Admission medications   Medication Sig Start Date End Date Taking? Authorizing Provider  albuterol (PROVENTIL HFA;VENTOLIN HFA) 108 (90 BASE) MCG/ACT inhaler Inhale 2 puffs into the lungs every 4 (four) hours as needed for wheezing. 06/01/14 07/30/25 Yes Colon Branch, MD  aspirin EC 81 MG tablet Take 81 mg by mouth daily.   Yes [provider]  budesonide-formoterol (SYMBICORT) 160-4.5 MCG/ACT inhaler Inhale 2 puffs into the lungs 2 (two) times daily. 09/01/14  Yes Paz, Alda Berthold, MD  diltiazem (CARDIZEM CD) 360 MG 24 hr capsule Take 1 capsule (360 mg total) by mouth daily. 04/06/17  Yes Paz, Alda Berthold, MD  Insulin Lispro Prot & Lispro (HUMALOG MIX 50/50 KWIKPEN) (50-50) 100 UNIT/ML Kwikpen Inject 65 Units into the skin 2 (two) times daily with a meal. And pen needles 2/day 05/26/17  Yes Renato Shin, MD  latanoprost (XALATAN) 0.005 % ophthalmic solution USE 1 DROP IN BOTH EYES AT BEDTIME 12/09/16  Yes [provider]  levothyroxine (SYNTHROID, LEVOTHROID) 50 MCG tablet Take 1 tablet (50  mcg total) by mouth daily before breakfast. 07/09/17  Yes Paz, Alda Berthold, MD  potassium chloride (K-DUR,KLOR-CON) 10 MEQ tablet Take 1 tablet (10 mEq total) by mouth daily. 04/06/17  Yes Paz, Alda Berthold, MD  rosuvastatin (CRESTOR) 40 MG tablet Take 1 tablet (40 mg total) by mouth daily. 04/30/17  Yes Paz, Alda Berthold, MD  telmisartan (MICARDIS) 40 MG tablet Take 1 tablet (40 mg total) by mouth daily. 06/08/17  Yes Colon Branch, MD  ACCU-CHEK SOFTCLIX  LANCETS lancets USE TO CHECK BLOOD SUGAR 2 TIMES DAILY 07/13/16   Renato Shin, MD  BD ULTRA-FINE PEN NEEDLES 29G X 12.7MM MISC USE AS DIRECTED 3 TIMES A DAY 06/01/16   Renato Shin, MD  Blood Glucose Monitoring Suppl (ACCU-CHEK AVIVA PLUS) W/DEVICE KIT 1 Device by Does not apply route once. 06/20/11   Renato Shin, MD  diltiazem (TAZTIA XT) 360 MG 24 hr capsule Take 1 capsule (360 mg total) by mouth daily. Patient not taking: Reported on 07/30/2017 07/29/16   Colon Branch, MD  glucose blood (ACCU-CHEK AVIVA PLUS) test strip 1 each by Other route 2 (two) times daily. And lancets 2/day 12/23/16   Renato Shin, MD    Family History Family History  Problem Relation Age of Onset  . Asthma Mother   . Stroke Mother   . Diabetes Other        M, B, S  . Hypertension Sister        M, S,B  . Pancreatic cancer Brother   . Colon cancer Neg Hx   . Prostate cancer Neg Hx   . Breast cancer Neg Hx     Social History Social History   Tobacco Use  . Smoking status: Former Smoker    Packs/day: 0.20    Years: 4.00    Pack years: 0.80    Types: Cigarettes    Last attempt to quit: 05/13/1995    Years since quitting: 22.2  . Smokeless tobacco: Never Used  Substance Use Topics  . Alcohol use: Yes    Comment: brandy once per wk  . Drug use: No     Allergies   Patient has no known allergies.   Review of Systems Review of Systems  HENT: Positive for nosebleeds.   Eyes: Positive for photophobia.  Respiratory: Negative for shortness of breath.   Cardiovascular: Negative for chest pain.  Neurological: Negative for weakness.  All other systems reviewed and are negative.    Physical Exam Updated Vital Signs BP (!) 151/82   Pulse 94   Temp 98.3 F (36.8 C) (Oral)   Resp 17   Ht '5\' 9"'$  (1.753 m)   Wt (!) 138.8 kg (306 lb)   SpO2 93%   BMI 45.19 kg/m   Physical Exam  Constitutional: She is oriented to person, place, and time. She appears well-developed and well-nourished. No distress.    HENT:  Head: Normocephalic and atraumatic.  Right Ear: External ear normal.  Left Ear: External ear normal.  Nose: Nose normal.  Mouth/Throat: Oropharynx is clear and moist. No oropharyngeal exudate.  Eyes: Pupils are equal, round, and reactive to light. Conjunctivae are normal.  Neck: Normal range of motion. Neck supple.  Cardiovascular: Normal rate, regular rhythm, normal heart sounds and intact distal pulses.  Pulmonary/Chest: Effort normal and breath sounds normal.  Abdominal: Soft. Bowel sounds are normal. There is no tenderness.  Musculoskeletal: Normal range of motion.  Neurological: She is alert and oriented to person, place, and time. She displays  normal reflexes.  Skin: Skin is warm and dry. Capillary refill takes less than 2 seconds.  Psychiatric: She has a normal mood and affect.     ED Treatments / Results  Labs (all labs ordered are listed, but only abnormal results are displayed)  Results for orders placed or performed during the hospital encounter of 07/29/17  CBC  Result Value Ref Range   WBC 6.3 4.0 - 10.5 K/uL   RBC 4.33 3.87 - 5.11 MIL/uL   Hemoglobin 11.4 (L) 12.0 - 15.0 g/dL   HCT 35.3 (L) 36.0 - 46.0 %   MCV 81.5 78.0 - 100.0 fL   MCH 26.3 26.0 - 34.0 pg   MCHC 32.3 30.0 - 36.0 g/dL   RDW 16.0 (H) 11.5 - 15.5 %   Platelets 401 (H) 150 - 400 K/uL  Protime-INR - (order if Patient is taking Coumadin / Warfarin)  Result Value Ref Range   Prothrombin Time 14.2 11.4 - 15.2 seconds   INR 1.11   Comprehensive metabolic panel  Result Value Ref Range   Sodium 136 135 - 145 mmol/L   Potassium 3.7 3.5 - 5.1 mmol/L   Chloride 103 101 - 111 mmol/L   CO2 23 22 - 32 mmol/L   Glucose, Bld 103 (H) 65 - 99 mg/dL   BUN 15 6 - 20 mg/dL   Creatinine, Ser 1.06 (H) 0.44 - 1.00 mg/dL   Calcium 8.6 (L) 8.9 - 10.3 mg/dL   Total Protein 7.7 6.5 - 8.1 g/dL   Albumin 2.9 (L) 3.5 - 5.0 g/dL   AST 26 15 - 41 U/L   ALT 18 14 - 54 U/L   Alkaline Phosphatase 58 38 - 126 U/L    Total Bilirubin 0.3 0.3 - 1.2 mg/dL   GFR calc non Af Amer 53 (L) >60 mL/min   GFR calc Af Amer >60 >60 mL/min   Anion gap 10 5 - 15   No results found.  Procedures Procedures (including critical care time)     Final Clinical Impressions(s) / ED Diagnoses   No longer bleeding in the ED.  Patient and family are frustrated it bled again. I apologized for this and tried to advise them how to prevent a recurrence.   Advised saline nasal spay BID.  Keeping BP in good control a vaporizer in the room and close follow up with ENT (ENT [facility] is now [facility])   Return for weakness, numbness, changes in vision or speech, fevers >100.4 unrelieved by medication, shortness of breath, intractable vomiting, or diarrhea, abdominal pain, Inability to tolerate liquids or food, cough, altered mental status or any concerns. No signs of systemic illness or infection. The patient is nontoxic-appearing on exam and vital signs are within normal limits.   I have reviewed the triage vital signs and the nursing notes. Pertinent labs &imaging results that were available during my care of the patient were reviewed by me and considered in my medical decision making (see chart for details).  After history, exam, and medical workup I feel the patient has been appropriately medically screened and is safe for discharge home. Pertinent diagnoses were discussed with the patient. Patient was given return precautions.   Sebastian Lurz, MD 07/30/17 [phone]

## 2017-07-30 NOTE — Discharge Instructions (Addendum)
Saline nasal spray twice daily Vaporizer in your bedroom

## 2017-08-01 ENCOUNTER — Emergency Department (HOSPITAL_COMMUNITY)
Admission: EM | Admit: 2017-08-01 | Discharge: 2017-08-01 | Disposition: A | Discharge status: Home / Self Care | Payer: Medicare Other | Attending: Emergency Medicine | Admitting: Emergency Medicine

## 2017-08-01 ENCOUNTER — Other Ambulatory Visit: Payer: Self-pay

## 2017-08-01 ENCOUNTER — Encounter (HOSPITAL_COMMUNITY): Payer: Self-pay

## 2017-08-01 DIAGNOSIS — Z87891 Personal history of nicotine dependence: Secondary | ICD-10-CM | POA: Diagnosis not present

## 2017-08-01 DIAGNOSIS — R040 Epistaxis: Secondary | ICD-10-CM | POA: Diagnosis not present

## 2017-08-01 DIAGNOSIS — E109 Type 1 diabetes mellitus without complications: Secondary | ICD-10-CM | POA: Insufficient documentation

## 2017-08-01 DIAGNOSIS — Z794 Long term (current) use of insulin: Secondary | ICD-10-CM | POA: Diagnosis not present

## 2017-08-01 DIAGNOSIS — I10 Essential (primary) hypertension: Secondary | ICD-10-CM | POA: Diagnosis not present

## 2017-08-01 DIAGNOSIS — Z79899 Other long term (current) drug therapy: Secondary | ICD-10-CM | POA: Diagnosis not present

## 2017-08-01 DIAGNOSIS — J45909 Unspecified asthma, uncomplicated: Secondary | ICD-10-CM | POA: Diagnosis not present

## 2017-08-01 LAB — BASIC METABOLIC PANEL WITH GFR
Anion gap: 10 (ref 5–15)
BUN: 10 mg/dL (ref 6–20)
CO2: 22 mmol/L (ref 22–32)
Calcium: 8.6 mg/dL — ABNORMAL LOW (ref 8.9–10.3)
Chloride: 104 mmol/L (ref 101–111)
Creatinine, Ser: 0.94 mg/dL (ref 0.44–1.00)
GFR calc Af Amer: >60 mL/min
GFR calc non Af Amer: >60 mL/min
Glucose, Bld: 171 mg/dL — ABNORMAL HIGH (ref 65–99)
Potassium: 4 mmol/L (ref 3.5–5.1)
Sodium: 136 mmol/L (ref 135–145)

## 2017-08-01 LAB — PROTIME-INR
INR: 1.05
Prothrombin Time: 13.6 s (ref 11.4–15.2)

## 2017-08-01 LAB — CBC
HCT: 35.5 % — ABNORMAL LOW (ref 36.0–46.0)
Hemoglobin: 11.5 g/dL — ABNORMAL LOW (ref 12.0–15.0)
MCH: 26.7 pg (ref 26.0–34.0)
MCHC: 32.4 g/dL (ref 30.0–36.0)
MCV: 82.4 fL (ref 78.0–100.0)
Platelets: 381 10*3/uL (ref 150–400)
RBC: 4.31 MIL/uL (ref 3.87–5.11)
RDW: 16.1 % — ABNORMAL HIGH (ref 11.5–15.5)
WBC: 5.8 10*3/uL (ref 4.0–10.5)

## 2017-08-01 MED ORDER — OXYMETAZOLINE HCL 0.05 % NA SOLN: 1.0000 | Freq: Once | NASAL | Status: DC

## 2017-08-01 NOTE — ED Triage Notes (Signed)
Pt arrives POV from home with c/o return of nose bleed. Pt has been seen both Monday and Wednesday of last week for same. Pt states nose bleed started at 2 am that stopped  Then started again at 4. P tstates no bleeding now.

## 2017-08-01 NOTE — ED Notes (Signed)
Pharmacy at bedside

## 2017-08-01 NOTE — ED Notes (Signed)
Patient able to ambulate independently at baseline at discharge

## 2017-08-01 NOTE — Discharge Instructions (Signed)
Humidify the air and apply bacitracin to the inside of the nose this will help prevent future nosebleeds. If the bleeding recurs spray Afrin into the nose and hold pressure for at least 10 minutes.  Please follow with your primary care doctor in the next 2 days for a check-up. They must obtain records for further management.   Do not hesitate to return to the Emergency Department for any new, worsening or concerning symptoms.

## 2017-08-01 NOTE — ED Provider Notes (Signed)
[facility] EMERGENCY DEPARTMENT Provider Note   CSN: [id number] Arrival date & time: 08/01/17  1020     History   Chief Complaint Chief Complaint  Patient presents with  . Epistaxis    HPI   Blood pressure (!) 151/91, pulse (!) 102, temperature 98.4 F (36.9 C), temperature source Oral, resp. rate (!) 22, height 5\' 9"  (1.753 m), weight (!) 138.8 kg (306 lb), SpO2 96 %.  [name] is a 67 y.o. female plating of recurrent right nares bleed onset early this morning around 3 AM.  She states that she held pressure over the nasal bridge (she states she is pressure over the bony part of the nose) it spontaneously resolved in about an hour.  That recurred, she called 911 and came to the emergency department.  This is the third episode in a week.  She has not been able to schedule an appointment with the ENT.  She is not anticoagulated which she takes a daily low-dose aspirin.  Bleeding has been resolved since 5 AM (approximately 7 hours).  Past Medical History:  Diagnosis Date  . Anterolisthesis    Grade 1, L4-5  . Bronchospasm 05/28/2012  . CHEST PAIN 11/18/2007  . DEGENERATIVE JOINT DISEASE 10/06/2006  . DEPRESSION 09/26/2008  . DIABETES MELLITUS 10/06/2006  . Diverticulosis    [id number]  . GERD (gastroesophageal reflux disease) 07/25/2013  . HYPERLIPIDEMIA 01/11/2009  . HYPERTENSION 10/06/2006  . INSOMNIA 09/26/2008  . Internal hemorrhoids   . OBSTRUCTIVE SLEEP APNEA 06/23/2008   Severe OSA per sleep study 2010, Rx a CPAP  . Pain in joint, multiple sites 11/10/2006  . UNSPECIFIED ANEMIA 12/10/2009    Patient Active Problem List   Diagnosis Date Noted  . PCP NOTES >>> 02/23/2015  . Nocturnal oxygen desaturation 01/02/2015  . Morbid obesity ([place]) 01/02/2015  . Asthma, chronic 01/02/2015  . GERD (gastroesophageal reflux disease) 07/25/2013  . DOE (dyspnea on exertion) 04/25/2013  . 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  Bronchospasm 05/28/2012  . Internal hemorrhoids without mention of complication 32/35/5732  . Annual physical exam 06/06/2011  . SKIN LESION 08/06/2009  . Hyperlipidemia 01/11/2009  . Depression 09/26/2008  . INSOMNIA 09/26/2008  . Obstructive sleep apnea 06/23/2008  . Chest pain 11/18/2007  . DM II (diabetes mellitus, type II), controlled ([place]) 10/06/2006  . Essential hypertension 10/06/2006  . Osteoarthritis 10/06/2006    Past Surgical History:  Procedure Laterality Date  . COLONOSCOPY  08/01/2011   Procedure: COLONOSCOPY;  Surgeon: Inda Castle, MD;  Location: [facility];  Service: Endoscopy;  Laterality: N/A;  . 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 Right knee replacement  2005     OB History   None      Home Medications    Prior to Admission medications   Medication Sig Start Date End Date Taking? Authorizing Provider  albuterol (PROVENTIL HFA;VENTOLIN HFA) 108 (90 BASE) MCG/ACT inhaler Inhale 2 puffs into the lungs every 4 (four) hours as needed for wheezing. 06/01/14 07/30/25 Yes Colon Branch, MD  aspirin EC 81 MG tablet Take 81 mg by mouth daily.   Yes [provider]  budesonide-formoterol (SYMBICORT) 160-4.5 MCG/ACT inhaler Inhale 2 puffs into the lungs 2 (two) times daily. Patient taking differently: Inhale 2 puffs into the lungs as needed.  09/01/14  Yes Paz, Alda Berthold, MD  diltiazem (CARDIZEM CD) 360 MG 24 hr capsule Take 1 capsule (360 mg total) by mouth daily. 04/06/17  Yes Paz, Alda Berthold, MD  Insulin Lispro Prot & Lispro (HUMALOG MIX 50/50 KWIKPEN) (50-50) 100 UNIT/ML Kwikpen Inject 65 Units into the skin 2 (two) times daily with a meal. And pen needles 2/day Patient taking differently: Inject 60 Units into the skin 2 (two) times daily with a meal. And pen needles 2/day 05/26/17  Yes Renato Shin, MD  latanoprost (XALATAN) 0.005 % ophthalmic solution USE 1 DROP IN BOTH EYES AT BEDTIME 12/09/16  Yes [provider]  levothyroxine (SYNTHROID, LEVOTHROID) 50  MCG tablet Take 1 tablet (50 mcg total) by mouth daily before breakfast. 07/09/17  Yes Paz, Alda Berthold, MD  potassium chloride (K-DUR,KLOR-CON) 10 MEQ tablet Take 1 tablet (10 mEq total) by mouth daily. 04/06/17  Yes Paz, Alda Berthold, MD  rosuvastatin (CRESTOR) 40 MG tablet Take 1 tablet (40 mg total) by mouth daily. 04/30/17  Yes Paz, Alda Berthold, MD  telmisartan (MICARDIS) 40 MG tablet Take 1 tablet (40 mg total) by mouth daily. 06/08/17  Yes Paz, Alda Berthold, MD  diltiazem (TAZTIA XT) 360 MG 24 hr capsule Take 1 capsule (360 mg total) by mouth daily. Patient not taking: Reported on 08/01/2017 07/29/16   Colon Branch, MD    Family History Family History  Problem Relation Age of Onset  . Asthma Mother   . Stroke Mother   . Diabetes Other        M, B, S  . Hypertension Sister        M, S,B  . Pancreatic cancer Brother   . Colon cancer Neg Hx   . Prostate cancer Neg Hx   . Breast cancer Neg Hx     Social History Social History   Tobacco Use  . Smoking status: Former Smoker    Packs/day: 0.20    Years: 4.00    Pack years: 0.80    Types: Cigarettes    Last attempt to quit: 05/13/1995    Years since quitting: 22.2  . Smokeless tobacco: Never Used  Substance Use Topics  . Alcohol use: Yes    Comment: brandy once per wk  . Drug use: No     Allergies   Patient has no known allergies.   Review of Systems Review of Systems  A complete review of systems was obtained and all systems are negative except as noted in the HPI and PMH.   Physical Exam Updated Vital Signs BP (!) 151/91   Pulse (!) 102   Temp 98.4 F (36.9 C) (Oral)   Resp (!) 22   Ht 5\' 9"  (1.753 m)   Wt (!) 138.8 kg (306 lb)   SpO2 96%   BMI 45.19 kg/m   Physical Exam  Constitutional: She is oriented to person, place, and time. She appears well-developed and well-nourished. No distress.  HENT:  Head: Normocephalic and atraumatic.  Mouth/Throat: Oropharynx is clear and moist.  Scant amount of dried blood in the right nares.   Eyes: Pupils are equal, round, and reactive to light. Conjunctivae and EOM are normal.  Neck: Normal range of motion.  Cardiovascular: Regular rhythm and intact distal pulses.  Very mild tachycardia.  Pulmonary/Chest: Effort normal and breath sounds normal.  Abdominal: Soft. There is no tenderness.  Musculoskeletal: Normal range of motion.  Neurological: She is alert and oriented to person, place, and time.  Skin: She is not diaphoretic.  Psychiatric: She has a normal mood and affect.  Nursing note and vitals reviewed.    ED Treatments / Results  Labs (  all labs ordered are listed, but only abnormal results are displayed) Labs Reviewed  CBC - Abnormal; Notable for the following components:      Result Value   Hemoglobin 11.5 (*)    HCT 35.5 (*)    RDW 16.1 (*)    All other components within normal limits  BASIC METABOLIC PANEL - Abnormal; Notable for the following components:   Glucose, Bld 171 (*)    Calcium 8.6 (*)    All other components within normal limits  PROTIME-INR    EKG None  Radiology No results found.  Procedures Procedures (including critical care time)  Medications Ordered in ED Medications  oxymetazoline (AFRIN) 0.05 % nasal spray 1 spray (1 spray Each Nare Not Given 08/01/17 1228)     Initial Impression / Assessment and Plan / ED Course  I have reviewed the triage vital signs and the nursing notes.  Pertinent labs & imaging results that were available during my care of the patient were reviewed by me and considered in my medical decision making (see chart for details).     Vitals:   08/01/17 1039 08/01/17 1043 08/01/17 1200 08/01/17 1215  BP: (!) 183/89  (!) 147/96 (!) 151/91  Pulse: (!) 118  (!) 106 (!) 102  Resp: (!) 22     Temp: 98.4 F (36.9 C)     TempSrc: Oral     SpO2: 91%  95% 96%  Weight:  (!) 138.8 kg (306 lb)    Height:  5\' 9"  (1.753 m)      Medications  oxymetazoline (AFRIN) 0.05 % nasal spray 1 spray (1 spray Each Nare  Not Given 08/01/17 1228)    ANESSA CHARLEY is 67 y.o. female presenting with resolved nosebleed, not anticoagulated.  Bleeding is resolved for many hours before coming to the ED.  It appeared that she was holding pressure along the nasal bridge rather than the cartilaginous part of the nose.  This patient has not been able to make an appointment because she did not realize she should have followed with her ear nose and throat doctor, I spent an extensive amount of time counseling this patient on preventative measures and management of nosebleeds.  I have offered to put in a nasal tampon or Rhino Rocket since this has been so recurrent but patient declines.  Initially patient was quite tachycardic however this is resolved on my exam, basic blood work without significant abnormality  She observed in the ED for several hours with no recurrence of her nosebleed.  I have again offered to put in nasal packing she is declined.  Evaluation does not show pathology that would require ongoing emergent intervention or inpatient treatment. Pt is hemodynamically stable and mentating appropriately. Discussed findings and plan with patient/guardian, who agrees with care plan. All questions answered. Return precautions discussed and outpatient follow up given.      Final Clinical Impressions(s) / ED Diagnoses   Final diagnoses:  Epistaxis    ED Discharge Orders    None       Karen Kays Charna Elizabeth 08/01/17 1351    Quintella Reichert, MD 08/03/17 [phone]

## 2017-08-04 ENCOUNTER — Encounter: Payer: Self-pay | Admitting: Endocrinology

## 2017-08-04 ENCOUNTER — Ambulatory Visit (INDEPENDENT_AMBULATORY_CARE_PROVIDER_SITE_OTHER): Payer: Medicare Other | Admitting: Endocrinology

## 2017-08-04 VITALS — BP 188/92 | HR 109 | Wt 303.0 lb

## 2017-08-04 DIAGNOSIS — E119 Type 2 diabetes mellitus without complications: Secondary | ICD-10-CM | POA: Diagnosis not present

## 2017-08-04 DIAGNOSIS — Z794 Long term (current) use of insulin: Secondary | ICD-10-CM

## 2017-08-04 LAB — POCT GLYCOSYLATED HEMOGLOBIN (HGB A1C): Hemoglobin A1C: 6.8

## 2017-08-04 MED ORDER — INSULIN LISPRO PROT & LISPRO (50-50 MIX) 100 UNIT/ML KWIKPEN
60.0000 [IU] | PEN_INJECTOR | Freq: Two times a day (BID) | SUBCUTANEOUS | 11 refills | Status: DC
Start: 2017-08-04 — End: 2017-10-24

## 2017-08-04 NOTE — Progress Notes (Signed)
Subjective:    Patient ID: [name], female    DOB: 14-Mar-1951, 67 y.o.   MRN: [id number]  HPI Pt returns for f/u of diabetes mellitus: DM type: Insulin-requiring type 2 Dx'ed: 7867 Complications: polyneuropathy and renal insufficiency.   Therapy: insulin since 2004.  GDM: never.  DKA: never. Severe hypoglycemia: never.  Pancreatitis: never.  Other: she declines weight-loss surgery; she did not achieve good glycemic control with multiple daily injections, but she has done better with a BID insulin regimen.  She has takes less insulin on Sunday mornings, due to risk hypoglycemia then.   Interval history: she brings a record of her cbg's which I have reviewed today.  It varies from 78-200.  There is no trend throughout the day. She says she never misses the insulin.  No recent steroids. Past Medical History:  Diagnosis Date  . Anterolisthesis    Grade 1, L4-5  . Bronchospasm 05/28/2012  . CHEST PAIN 11/18/2007  . DEGENERATIVE JOINT DISEASE 10/06/2006  . DEPRESSION 09/26/2008  . DIABETES MELLITUS 10/06/2006  . Diverticulosis    5449,2010  . GERD (gastroesophageal reflux disease) 07/25/2013  . HYPERLIPIDEMIA 01/11/2009  . HYPERTENSION 10/06/2006  . INSOMNIA 09/26/2008  . Internal hemorrhoids   . OBSTRUCTIVE SLEEP APNEA 06/23/2008   Severe OSA per sleep study 2010, Rx a CPAP  . Pain in joint, multiple sites 11/10/2006  . UNSPECIFIED ANEMIA 12/10/2009    Past Surgical History:  Procedure Laterality Date  . COLONOSCOPY  08/01/2011   Procedure: COLONOSCOPY;  Surgeon: Inda Castle, MD;  Location: [facility];  Service: Endoscopy;  Laterality: N/A;  . 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 Right knee replacement  2005    Social History   Socioeconomic History  . Marital status: Widowed    Spouse name: Not on file  . Number of children: 4  . Years of education: Not on file  . Highest education level: Not on file  Occupational History  . Occupation: disability    Employer:  UNEMPLOYED  Social Needs  . Financial resource strain: Not on file  . Food insecurity:    Worry: Not on file    Inability: Not on file  . Transportation needs:    Medical: Not on file    Non-medical: Not on file  Tobacco Use  . Smoking status: Former Smoker    Packs/day: 0.20    Years: 4.00    Pack years: 0.80    Types: Cigarettes    Last attempt to quit: 05/13/1995    Years since quitting: 22.2  . Smokeless tobacco: Never Used  Substance and Sexual Activity  . Alcohol use: Yes    Comment: brandy once per wk  . Drug use: No  . Sexual activity: Yes  Lifestyle  . Physical activity:    Days per week: Not on file    Minutes per session: Not on file  . Stress: Not on file  Relationships  . Social connections:    Talks on phone: Not on file    Gets together: Not on file    Attends religious service: Not on file    Active member of club or organization: Not on file    Attends meetings of clubs or organizations: Not on file    Relationship status: Not on file  . Intimate partner violence:    Fear of current or ex partner: Not on file    Emotionally abused: Not on file    Physically abused: Not on  file    Forced sexual activity: Not on file  Other Topics Concern  . Not on file  Social History Narrative   Widow , lives by herself   Lost a son, 3 living    Lost husband    Current Outpatient Medications on File Prior to Visit  Medication Sig Dispense Refill  . albuterol (PROVENTIL HFA;VENTOLIN HFA) 108 (90 BASE) MCG/ACT inhaler Inhale 2 puffs into the lungs every 4 (four) hours as needed for wheezing. 1 Inhaler 6  . aspirin EC 81 MG tablet Take 81 mg by mouth daily.    . budesonide-formoterol (SYMBICORT) 160-4.5 MCG/ACT inhaler Inhale 2 puffs into the lungs 2 (two) times daily. (Patient taking differently: Inhale 2 puffs into the lungs as needed. ) 10.2 g 5  . diltiazem (CARDIZEM CD) 360 MG 24 hr capsule Take 1 capsule (360 mg total) by mouth daily. 90 capsule 1  . diltiazem  (TAZTIA XT) 360 MG 24 hr capsule Take 1 capsule (360 mg total) by mouth daily. (Patient not taking: Reported on 08/06/2017) 90 capsule 1  . latanoprost (XALATAN) 0.005 % ophthalmic solution USE 1 DROP IN BOTH EYES AT BEDTIME  10  . levothyroxine (SYNTHROID, LEVOTHROID) 50 MCG tablet Take 1 tablet (50 mcg total) by mouth daily before breakfast. 30 tablet 0  . potassium chloride (K-DUR,KLOR-CON) 10 MEQ tablet Take 1 tablet (10 mEq total) by mouth daily. 90 tablet 1  . rosuvastatin (CRESTOR) 40 MG tablet Take 1 tablet (40 mg total) by mouth daily. 90 tablet 1  . telmisartan (MICARDIS) 40 MG tablet Take 1 tablet (40 mg total) by mouth daily. 90 tablet 1   No current facility-administered medications on file prior to visit.     No Known Allergies  Family History  Problem Relation Age of Onset  . Asthma Mother   . Stroke Mother   . Diabetes Other        M, B, S  . Hypertension Sister        M, S,B  . Pancreatic cancer Brother   . Colon cancer Neg Hx   . Prostate cancer Neg Hx   . Breast cancer Neg Hx     BP (!) 188/92 (BP Location: Left Arm, Patient Position: Sitting, Cuff Size: Normal)   Pulse (!) 109   Wt (!) 303 lb (137.4 kg)   SpO2 92%   BMI 44.75 kg/m    Review of Systems She denies hypoglycemia    Objective:   Physical Exam VITAL SIGNS:  See vs page GENERAL: no distress Pulses: foot pulses are intact bilaterally.   MSK: no deformity of the feet or ankles.  CV: no leg edema, but there are bilat vv's.    Skin:  no ulcer on the feet or ankles.  normal color and temp on the feet and ankles.  Neuro: sensation is intact to touch on the feet and ankles.   Ext: There is bilateral onychomycosis of the toenails.   A1c=6.8%     Assessment & Plan:  HTN: is noted today Insulin-requiring type 2 DM, with renal insuff: well-controlled.   Patient Instructions  Your blood pressure is high today.  Please see your primary care provider soon, to have it rechecked check your blood  sugar 2 times a day.  vary the time of day when you check, between before the 3 meals, and at bedtime.  also check if you have symptoms of your blood sugar being too high or too low.  please keep a  record of the readings and bring it to your next appointment here.  please call us sooner if your blood sugar goes below 70, or if it stays over 200.   Please continue the same insulin: 65 units twice a day (with the first and last meals of the day).  However, on Sunday mornings, take just 10 units.   On this type of insulin schedule, you should eat meals on a regular schedule.  If a meal is missed or significantly delayed, your blood sugar could go low.   Please come back for a follow-up appointment in 4 months.

## 2017-08-04 NOTE — Patient Instructions (Addendum)
Your blood pressure is high today.  Please see your primary care provider soon, to have it rechecked check your blood sugar 2 times a day.  vary the time of day when you check, between before the 3 meals, and at bedtime.  also check if you have symptoms of your blood sugar being too high or too low.  please keep a record of the readings and bring it to your next appointment here.  please call us sooner if your blood sugar goes below 70, or if it stays over 200.   Please continue the same insulin: 65 units twice a day (with the first and last meals of the day).  However, on Sunday mornings, take just 10 units.   On this type of insulin schedule, you should eat meals on a regular schedule.  If a meal is missed or significantly delayed, your blood sugar could go low.   Please come back for a follow-up appointment in 4 months.

## 2017-08-06 ENCOUNTER — Other Ambulatory Visit: Payer: Self-pay | Admitting: Internal Medicine

## 2017-08-06 ENCOUNTER — Other Ambulatory Visit: Payer: Self-pay | Admitting: *Deleted

## 2017-08-06 NOTE — Patient Outreach (Signed)
[facility]) Care Management  08/06/2017  [name] 08-15-1950 [id number]   [name] is a 67 y.o. female with past medical history which includes diabetes mellitus type II, hyperlipidemia, hypertension, gastroesophageal reflux disease, hypothyroidism, obstructive sleep apnea, anemia, diverticulosis, degenerative joint disease with history of anterolisthesis L4-5, and depression. [name] has most recently visited the ED (x 3 over a 5 day period) with recurrent epistaxis. [name] was referred to [facility] for assistance with chronic disease management and care coordination related to her ENT care needs. I reached out to [name] today by phone and was able to speak with her at length after she provided consent to engage with [place] for care management services.   New Health Condition (Recurrent Epistaxis) - [name] tells me she has been to the ED x 3 since last week with recurrent epistaxis with the last occurrence being on Saturday. She says that she has had one recurrent episode since returning home. Last evening she was lying down and when she arose, she began having bright red bleeding from the right side of her nose and from her mouth when she coughed. She "held a tissue tightly across the top of the nose for 10 minutes" as instructed by the nurse in the ED and reports that she coughed and expelled a few clots from her mouth and nose but the bleeding stopped. [name] says that she had frequent nosebleeds as a child but has not had them in years until this recent incident. She is scheduled to see her primary care provider, [name] tomorrow.   ADL's/IADL's/Social Support - [name] lives alone in apartment. She is able to perform ADL's independently but needs help currently with some IADL's. She says she can depend on her next door neighbor for help anytime she is not at work. Her neighbor works during the daytime hours and arrives home in the  late afternoon. [name] son lives a few minutes away but also works during the day and sometimes works long hours. She says her neighbor and her son help her with transportation to doctor's appointments and to the drug store ([facility] on [address]) and grocery. They also go to the mailbox in the common area at her apartment complex and bring her mail as she has recently felt unable to make it that far for fear that she might have a nosebleed.   Diabetes Management - [name] reports that the doctor who manages her diabetes is [name] who she saw in the office on Tuesday. She keeps a cbg journal and reported the findings as outlined below to [name] on Tuesday. Her fastin cbg this morning was 81 and she denies feeling dizzy or lightheaded but states she has been lying down most of the morning. She was able to tell me her exact insulin regimen but states she has been decreasing her dose from 60u bid to 50 bid sometimes when she has felt dizzy. She correlates the feelings of dizziness with cbg findings in the 80's and 90's. She said she spoke with [name] about this on Tuesday. [name] reports following a carb modified diet but states she sometimes has difficulty with strict adherence. I provided Emmi educational materials to her (mail). We reviewed signs and symptoms of hypoglycemia and what to do when she has symptoms. I also encouraged her to use the 24/7 nurse line if she experiences mild symptoms after hours or on  the weekend.   cbg findings:  08/06/17 (fasting): 81 08/05/17 (evening): 150 08/05/17 (fasting): 127 08/04/17 (evening): 89 08/04/17 (fasting): 107 08/03/17 (evening): 188 08/02/17 (fasting): 134   Hypertension Management - as noted, [name] saw [name] on Tuesday and she tells me that her blood pressure during that visit was 188/92. She reports taking Cardizem CD 260 QD and Micardis 40mg  po qd as prescribed. [name] has a digital bp monitor but  states she can't get it to work and reports that the visiting nurse from her insurance plan was unable to get it to work. Her insurance plan nurse ordered a wrist monitor for her but she hasn't tried using it yet. I advised that she take it with her to the doctor's office tomorrow and ask the nurse to show her how to use it. She says that once she understands how to use it she will begin checking it daily and recording in the same way she records her cbg's. I will mail Emmi educational materials about hypertension management to [name] also.   Plan: I will reach out to [name] next week to follow up on the health concerns as outlined above.   [facility] CM Care Plan Problem One     Most Recent Value  Care Plan Problem One  Knowledge Deficits related to management of new health condition (epistaxis)   Role Documenting the Problem One  Care Management [place] for Problem One  Active  [facility]N Long Term Goal   Over the next 31 days, patient will verbalize understanding of long term plan for management of epistaxis  [facility] Long Term Goal Start Date  08/06/17  Interventions for Problem One Long Term Goal  Emmi educational materials prescried,  reviewed instructions from ED,  discussed signs and symptoms and management  [facility] CM Short Term Goal #1   Over the next 3 days, patient will attend scheduled provider appointment  [facility] CM Short Term Goal #1 Start Date  08/06/17  Interventions for Short Term Goal #1  reviewed upcoming appointment schedule,  ensured that patient has transportation  [facility] CM Short Term Goal #2   Over the next 30 days, patient will verbalize understanding of instructions re: treatment of epistaxiis  [facility] CM Short Term Goal #2 Start Date  08/06/17  Interventions for Short Term Goal #2  advised patient to discuss plan for treatment of epistaxis and ask prepared questions re: instructions for treatment,  advised patient to call 24/7 on call nurse to advise if needed    [facility] CM Care Plan  Problem Two     Most Recent Value  Care Plan Problem Two  Knowledge Deficits re: Long Term Management of Hypertension  Role Documenting the Problem Two  Care Management Coordinator  Care Plan for Problem Two  Active  Interventions for Problem Two Long Term Goal   interviewed patient re: current knowledge of DM management plan,  reviewed instructions provided by MD and ED staff  [facility] Long Term Goal  Over the next 31 days, patient will verbalize understanding of long term plan of care for management of hypertension  [facility]N Long Term Goal Start Date  08/06/17  [facility] CM Short Term Goal #1   Over the next 3 days, patient will attend scheduled provider appointment  [facility] CM Short Term Goal #1 Start Date  08/06/17  Interventions for Short Term Goal #2   reviewed upcoming provider schedule/ensured that patient has transportation to appointment  [facility] CM Short Term  Goal #2   Over the next 30 days, patient will check bp daily and record as eviidenced by her report of same  [facility] CM Short Term Goal #2 Start Date  08/06/17  Interventions for Short Term Goal #2  advised patient to take new bp monitor MD appointment and ask RN to help her learn to use,  advised dto check adn record bp daily  [facility] CM Short Term Goal #3   Over the next 30 days, patient will verbalize signs and symptoms of hyperension and when to call provider to report  [facility] CM Short Term Goal #3 Start Date  08/06/17  Interventions for Short Term Goal #3  reviewed signs and symptoms of hypertension, when to report,  Emmi educational materials prescribed    [facility] CM Care Plan Problem Three     Most Recent Value  Care Plan Problem Three  a  Role Documenting the Problem Three  Care Management [place] for Problem Three  Active  THN Long Term Goal   Over the next 31 days, patient will verbalize understanding of long term plan of care for mangaement of DM  [facility]N Long Term Goal Start Date  08/06/17  Interventions for Problem Three Long Term Goal   interviewed patien re: understanding of plan of care for DM management  [facility] CM Short Term Goal #1   Over the next 30 days, patient will cotinue to check and record cbg's daily, calling provider for findings < 80  [facility] CM Short Term Goal #1 Start Date  08/06/17  Interventions for Short Term Goal #1  reviewed with patient importance of aherence to monitoring regimen,  advised patient to notify provider of findings < 80  [facility] CM Short Term Goal #2   Over the next 30 days, patient will verbalize understanding of signs and symptoms of hypoglycemia  [facility] CM Short Term Goal #2 Start Date  08/06/17  Interventions for Short Term Goal #2  reviewed signs and symptoms of hypoglycemia with patient, Emmi educational materials provided      [facility]  [phone]

## 2017-08-07 ENCOUNTER — Ambulatory Visit (HOSPITAL_BASED_OUTPATIENT_CLINIC_OR_DEPARTMENT_OTHER)
Admission: RE | Admit: 2017-08-07 | Discharge: 2017-08-07 | Disposition: A | Discharge status: Home / Self Care | Payer: Medicare Other | Source: Ambulatory Visit | Attending: Internal Medicine | Admitting: Internal Medicine

## 2017-08-07 ENCOUNTER — Ambulatory Visit (INDEPENDENT_AMBULATORY_CARE_PROVIDER_SITE_OTHER): Payer: Medicare Other | Admitting: Internal Medicine

## 2017-08-07 ENCOUNTER — Encounter: Payer: Self-pay | Admitting: Internal Medicine

## 2017-08-07 VITALS — BP 142/88 | HR 98 | Temp 97.9°F | Resp 18 | Ht 69.0 in | Wt 300.5 lb

## 2017-08-07 DIAGNOSIS — R200 Anesthesia of skin: Secondary | ICD-10-CM

## 2017-08-07 DIAGNOSIS — E039 Hypothyroidism, unspecified: Secondary | ICD-10-CM | POA: Diagnosis not present

## 2017-08-07 DIAGNOSIS — R918 Other nonspecific abnormal finding of lung field: Secondary | ICD-10-CM | POA: Insufficient documentation

## 2017-08-07 DIAGNOSIS — R040 Epistaxis: Secondary | ICD-10-CM

## 2017-08-07 DIAGNOSIS — J9611 Chronic respiratory failure with hypoxia: Secondary | ICD-10-CM

## 2017-08-07 LAB — TSH: TSH: 28.45 mIU/L — ABNORMAL HIGH (ref 0.40–4.50)

## 2017-08-07 NOTE — Progress Notes (Signed)
Pre visit review using our clinic review tool, if applicable. No additional management support is needed unless otherwise documented below in the visit note. 

## 2017-08-07 NOTE — Patient Instructions (Addendum)
Get your blood work  Please come back in 4 weeks for a checkup.  Please make an appointment  Get a chest x-ray downstairs  Nosebleeds: Do not take aspirin for now Vaseline to both sides of the nose twice a day If bleeding, apply pressure. If the bleeding does not stop: Go to the ER We are sending you to the  ENT doctor ASAP   You need to use your oxygen every night. We are getting oxygen to use 24/7  Use Symbicort twice a day every day regardless of symptoms until you see me next  If you have severe difficulty breathing: Go to the ER

## 2017-08-07 NOTE — Progress Notes (Signed)
SATURATION QUALIFICATIONS: (This note is used to comply with regulatory documentation for home oxygen)  Patient Saturations on Room Air at Rest = 89%  Patient Saturations on Room Air while Ambulating = 86%  Patient Saturations on 2 Liters of oxygen while Ambulating = %  Please briefly explain why patient needs home oxygen:Respiratory failure

## 2017-08-07 NOTE — Progress Notes (Signed)
Subjective:    Patient ID: [name], female    DOB: Aug 21, 1950, 67 y.o.   MRN: [id number]  DOS:  08/07/2017 Type of visit - description : Acute visit Interval history: The patient has been seen in the ER 3 times this months with recurrent epistaxis. Last visit was 08/01/2017, at the time a CBC and BMP showed that the hemoglobin decreased from 12.3-11.5.  BMP was satisfactory.  Bleeding is consistently from the right side, she sees clots from the nose and sometimes she has clots in her mouth when she cough.  She was also noted to have hypoxia, O2 sat upon arrival 89. + H/O respiratory  failure, has not been able to use her O2 sat at night due to nosebleeds.  Was seen at Dr. Loanne Drilling office 3 days ago, at the time BP was 188/92, O2 sat 92%.  Review of Systems Denies fever chills, no recent URI Cough is at baseline, cough from time to time, not using Symbicort regularly. Denies chest pain, lower extremity edema.  Occasional palpitations. No recent prolonged car trips or airplane trip.  Past Medical History:  Diagnosis Date  . Anterolisthesis    Grade 1, L4-5  . Bronchospasm 05/28/2012  . CHEST PAIN 11/18/2007  . DEGENERATIVE JOINT DISEASE 10/06/2006  . DEPRESSION 09/26/2008  . DIABETES MELLITUS 10/06/2006  . Diverticulosis    0109,3235  . GERD (gastroesophageal reflux disease) 07/25/2013  . HYPERLIPIDEMIA 01/11/2009  . HYPERTENSION 10/06/2006  . INSOMNIA 09/26/2008  . Internal hemorrhoids   . OBSTRUCTIVE SLEEP APNEA 06/23/2008   Severe OSA per sleep study 2010, Rx a CPAP  . Pain in joint, multiple sites 11/10/2006  . UNSPECIFIED ANEMIA 12/10/2009    Past Surgical History:  Procedure Laterality Date  . COLONOSCOPY  08/01/2011   Procedure: COLONOSCOPY;  Surgeon: Inda Castle, MD;  Location: [facility] ENDOSCOPY;  Service: Endoscopy;  Laterality: N/A;  . 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 Right knee replacement  2005    Social History   Socioeconomic History  . Marital status:  Widowed    Spouse name: Not on file  . Number of children: 4  . Years of education: Not on file  . Highest education level: Not on file  Occupational History  . Occupation: disability    Employer: UNEMPLOYED  Social Needs  . Financial resource strain: Not on file  . Food insecurity:    Worry: Not on file    Inability: Not on file  . Transportation needs:    Medical: Not on file    Non-medical: Not on file  Tobacco Use  . Smoking status: Former Smoker    Packs/day: 0.20    Years: 4.00    Pack years: 0.80    Types: Cigarettes    Last attempt to quit: 05/13/1995    Years since quitting: 22.2  . Smokeless tobacco: Never Used  Substance and Sexual Activity  . Alcohol use: Yes    Comment: brandy once per wk  . Drug use: No  . Sexual activity: Yes  Lifestyle  . Physical activity:    Days per week: Not on file    Minutes per session: Not on file  . Stress: Not on file  Relationships  . Social connections:    Talks on phone: Not on file    Gets together: Not on file    Attends religious service: Not on file    Active member of club or organization: Not on file    Attends  meetings of clubs or organizations: Not on file    Relationship status: Not on file  . Intimate partner violence:    Fear of current or ex partner: Not on file    Emotionally abused: Not on file    Physically abused: Not on file    Forced sexual activity: Not on file  Other Topics Concern  . Not on file  Social History Narrative   Widow , lives by herself   Lost a son, 3 living    Lost husband      Allergies as of 08/07/2017   No Known Allergies     Medication List        Accurate as of 08/07/17  2:44 PM. Always use your most recent med list.          albuterol 108 (90 Base) MCG/ACT inhaler Commonly known as:  PROVENTIL HFA;VENTOLIN HFA Inhale 2 puffs into the lungs every 4 (four) hours as needed for wheezing.   aspirin EC 81 MG tablet Take 81 mg by mouth daily.   budesonide-formoterol  160-4.5 MCG/ACT inhaler Commonly known as:  SYMBICORT Inhale 2 puffs into the lungs 2 (two) times daily.   diltiazem 360 MG 24 hr capsule Commonly known as:  CARDIZEM CD Take 1 capsule (360 mg total) by mouth daily.   Insulin Lispro Prot & Lispro (50-50) 100 UNIT/ML Kwikpen Commonly known as:  HUMALOG MIX 50/50 KWIKPEN Inject 60 Units into the skin 2 (two) times daily with a meal. And pen needles 2/day   latanoprost 0.005 % ophthalmic solution Commonly known as:  XALATAN USE 1 DROP IN BOTH EYES AT BEDTIME   levothyroxine 50 MCG tablet Commonly known as:  SYNTHROID, LEVOTHROID Take 1 tablet (50 mcg total) by mouth daily before breakfast.   potassium chloride 10 MEQ tablet Commonly known as:  K-DUR,KLOR-CON Take 1 tablet (10 mEq total) by mouth daily.   rosuvastatin 40 MG tablet Commonly known as:  CRESTOR Take 1 tablet (40 mg total) by mouth daily.   telmisartan 40 MG tablet Commonly known as:  MICARDIS Take 1 tablet (40 mg total) by mouth daily.          Objective:   Physical Exam BP (!) 142/88 (BP Location: Left Arm, Patient Position: Sitting, Cuff Size: Normal)   Pulse 98   Temp 97.9 F (36.6 C) (Oral)   Resp 18   Ht 5\' 9"  (1.753 m)   Wt (!) 300 lb 8 oz (136.3 kg)   SpO2 (!) 89%   BMI 44.38 kg/m  General:   Well developed, well nourished . NAD.  HEENT:  Normocephalic . Face symmetric, atraumatic. Nostrils: Left normal, right: Some erythema at the septum, no acute bleeding now. Lungs:  slt  decreased breath sounds but clear Normal respiratory effort, no intercostal retractions, no accessory muscle use. Heart: RRR,  no murmur.  No pretibial edema bilaterally  Skin: Not pale. Not jaundice Neurologic:  alert & oriented X3.  Speech normal, gait appropriate for age and unassisted Psych--  Cognition and judgment appear intact.  Cooperative with normal attention span and concentration.  Behavior appropriate. No anxious or depressed appearing.        Assessment & Plan:   Assessment DM per endocrinology HTN Hyperlipidemia For this in the next 2018 Depression, insomnia (citalopram caused headache ? 05-2014) MSK: --DJD, saw ortho ~06-2014 --Multiple joint pains, 2008, sedimentation rate, RA >> negative --Back pain>> -- MRI 2007: Epidural lipomatosis, congenitally short pedicles, spondylosis, and disc bulges and protrusions  contribute to central, foraminal, and subarticular lateral recess stenoses as detailed above.  --2008, saw Ortho, Rx local injections  --local injections back Dr Mina Marble  ~ 12-2015 GERD Morbid obesity Pulmonary -Chronic respiratory failure  -asthma -OSA dx 2010, severe, nocturnal desaturation, intolerant to CPAP, on nocturnal O2 -ONO 01/2017:  + Hypoxia at night, rec  to continue oxygen at night unless able  to tolerate a CPAP which she cannot DOE, chronic CP, chronic RUQ pain:: 2004--Cath neg, areterial LE dopplers (-) 06-2003-- abd. u/s fatty liver 01-2005-- CP...neg u/s GB, neg HIDA 10-06--saw cards--cardiolite neg 12-2007 abnormal stress test Cath 02-01-08: normal coronaries  chest pain, stress test echo okay 09/2016   PLAN: Nosebleeds: Persistent, aspirin is on hold at this point.  Refer urgently to ENT.  Recommend Vaseline to each nostril twice daily.  Also apply pressure.  ER if severe bleeding Respiratory failure, asthma: Patient O2 sat upon arrival was 89, attempted 6-minute walk, after 2 minutes we had to stop due to severe shortness of breath, O2 sat dropped to 86%. She already has oxygen at night, will need oxygen 24/7, will arrange.  2 L. She is actually not having today any exacerbation that I can tell, no recent URI, no increase of cough, no edema. For completeness will get a chest x-ray  Paresthesias: Also, chronic numbness at the 2, 3, 4/5 digit on the left.  No neck pain.  Refer to neurology HTN: BP was elevated few days ago, better today. Hypothyroidism: Recheck TSH.  Reports good med  compliance RTC 4 weeks.

## 2017-08-08 ENCOUNTER — Telehealth: Payer: Self-pay | Admitting: Internal Medicine

## 2017-08-08 MED ORDER — DOXYCYCLINE HYCLATE 100 MG PO TABS: 100.0000 mg | ORAL_TABLET | Freq: Two times a day (BID) | ORAL | 0 refills | Status: DC

## 2017-08-08 MED ORDER — LEVOTHYROXINE SODIUM 75 MCG PO TABS: 75.0000 ug | ORAL_TABLET | Freq: Every day | ORAL | 3 refills | Status: DC

## 2017-08-08 NOTE — Assessment & Plan Note (Signed)
Nosebleeds: Persistent, aspirin is on hold at this point.  Refer urgently to ENT.  Recommend Vaseline to each nostril twice daily.  Also apply pressure.  ER if severe bleeding Respiratory failure, asthma: Patient O2 sat upon arrival was 89, attempted 6-minute walk, after 2 minutes we had to stop due to severe shortness of breath, O2 sat dropped to 86%. She already has oxygen at night, will need oxygen 24/7, will arrange.  2 L. She is actually not having today any exacerbation that I can tell, no recent URI, no increase of cough, no edema. For completeness will get a chest x-ray  Paresthesias: Also, chronic numbness at the 2, 3, 4/5 digit on the left.  No neck pain.  Refer to neurology HTN: BP was elevated few days ago, better today. Hypothyroidism: Recheck TSH.  Reports good med compliance RTC 4 weeks.

## 2017-08-08 NOTE — Telephone Encounter (Signed)
Chest x-ray consistent with pneumonia, TSH elevated I spoke with the patient, she is feeling well, denies fever or more short of breath.  Recommend ER if she feels poorly. Prescription for doxycycline sent Needs a stronger Synthroid a new prescription for 75 mcg was sent. Will follow-up in May as already scheduled

## 2017-08-10 ENCOUNTER — Telehealth: Payer: Self-pay

## 2017-08-10 DIAGNOSIS — G4733 Obstructive sleep apnea (adult) (pediatric): Secondary | ICD-10-CM | POA: Diagnosis not present

## 2017-08-10 DIAGNOSIS — R0902 Hypoxemia: Secondary | ICD-10-CM | POA: Diagnosis not present

## 2017-08-10 NOTE — Telephone Encounter (Signed)
Spoke w/ Ivin Booty at [facility]- Pt currently has overnight O2 with them- informed them that Pt is needing status changed to 24/7 oxygen w/ portable- asked them how I could go about getting those forms. Ivin Booty requested I fax over last OV notes w/ O2 requirements for medicare to [phone] and she will have paperwork put together.

## 2017-08-10 NOTE — Telephone Encounter (Signed)
I already refilled levothyroxine, I actually change the dose, patient aware.  See phone note

## 2017-08-10 NOTE — Telephone Encounter (Signed)
Received refill request for levothyroxine. TSH completed on 08/07/2017 out of range. Pt stated at [place] on Friday that she had good compliance w/ medication. Please advise.

## 2017-08-11 ENCOUNTER — Other Ambulatory Visit: Payer: Self-pay | Admitting: *Deleted

## 2017-08-11 NOTE — Patient Outreach (Signed)
[place] [facility]) Care Management  08/11/2017  [name] Feb 19, 1951 [id number]  [name] a 67 y.o.femalewith past medical history which includes diabetes mellitus type II, hyperlipidemia, hypertension, gastroesophageal reflux disease, hypothyroidism, obstructive sleep apnea, anemia, diverticulosis, degenerative joint disease with history of anterolisthesis L4-5, and depression. [name] has most recently visited the ED (x 3 over a 5 day period) with recurrent epistaxis. [name] was referred to [place] [facility] for assistance with chronic disease management and care coordination related to her ENT care needs. I reached out to [name] today by phone to follow up with her after her provider appointment and to assist with any transition of care needs.    New Health Condition (Recurrent Epistaxis) - [name] has had 3 ED visits in the last month for recurrent epistaxis. Since her last ED visit, she employed a strategy taught to her by the ED nurse when she experienced a nose bleed at home and it was successful. [name] saw her primary care provider, Dr. Kathlene November in the office on Friday in follow up. Dr. Larose Kells helped [name] with a referral to the ENT provider which is scheduled for April 4. [name] says she has all the needed information and has a ride to the office.   Diabetes Management - [name] reports that the doctor who manages her diabetes is Dr. Renato Shin who she saw in the office on Tuesday. She keeps a cbg journal and reports following a carb modified diet. I provided Emmi educational materials to her by mail. We reviewed signs and symptoms of hypoglycemia last week and again today as well as what to do when she has symptoms. I also encouraged her to use the 24/7 nurse line if she experiences mild symptoms after hours or on the weekend.   Hypertension Management - When [name] and I spoke last week, she told me that her blood  pressure was found to 188/92 at her endocrinology visit even with strict adherence to her prescribed medication regimen which includes Cardiem CD 260 QD and Micardis '40mg'$  po qd. [name] had "wrist" bp monitor provided by her health plan nuse but she wasn't sure how to use it. I advised that she take it with her to the doctor's office and ask for elp/instruction.She told me today that she forgot to take it in. I offered to schedule an appointment to coe see her at her home next week to help her with it but she declined stating she would take it to her next doctor appointment this week.  I discussed her prescribed heart healthy diet and mailed Emmi educational materials about hypertension management to [name] also.   DME Needs - [name] says her PCP office nurse is helping with orders for a new oxygen delivery system. I see notation by the office CMA re: her outreach to the O2 provider and ongoing plans for follow up in the EMR. [name] anticipates a call from the nurse by the end of this week and will reach out to her if she has not heard from her by late Friday.   Plan: I will follow up with [name] Done by phone next week.   [name] will outreach to her provider office or me if she has questions or concerns.   [name] will attend her scheduled provider appointments and will take her wrist bp monitor with her.   [facility] CM Care Plan Problem One  Most Recent Value  Care Plan Problem One  Knowledge Deficits related to management of new health condition (epistaxis)   Role Documenting the Problem One  Care Management [place] for Problem One  Active  THN Long Term Goal   Over the next 31 days, patient will verbalize understanding of long term plan for management of epistaxis  THN Long Term Goal Start Date  08/06/17  Interventions for Problem One Long Term Goal  reviewed information and plan provided to patient at PCP appointment,  addressed questions,  provided contact info  and 24/7 nurse line info  [facility] CM Short Term Goal #1   Over the next 3 days, patient will attend scheduled provider appointment  [facility] CM Short Term Goal #1 Start Date  08/06/17  [facility] CM Short Term Goal #1 Met Date  08/11/17  THN CM Short Term Goal #2   Over the next 30 days, patient will verbalize understanding of instructions re: treatment of epistaxiis  THN CM Short Term Goal #2 Start Date  08/06/17  Interventions for Short Term Goal #2  reviewed instructions as outlined by ED nurse and provider    [facility] CM Care Plan Problem Two     Most Recent Value  Care Plan Problem Two  Knowledge Deficits re: Long Term Management of Hypertension  Role Documenting the Problem Two  Care Management Coordinator  Care Plan for Problem Two  Active  Interventions for Problem Two Long Term Goal   reviewed findings from provider visit,  discussed adherence to [facility] regimen and provider instructions  THN Long Term Goal  Over the next 31 days, patient will verbalize understanding of long term plan of care for management of hypertension  THN Long Term Goal Start Date  08/06/17  THN CM Short Term Goal #1   Over the next 3 days, patient will attend scheduled provider appointment  [facility] CM Short Term Goal #1 Start Date  08/06/17  [facility] CM Short Term Goal #1 Met Date   08/11/17  THN CM Short Term Goal #2   Over the next 30 days, patient will check bp daily and record as eviidenced by her report of same  THN CM Short Term Goal #2 Start Date  08/06/17  [facility] CM Short Term Goal #3   Over the next 30 days, patient will verbalize signs and symptoms of hyperension and when to call provider to report  [facility] CM Short Term Goal #3 Start Date  08/06/17  Interventions for Short Term Goal #3  reviewed signs and symptoms and when to call provider    [facility] CM Care Plan Problem Three     Most Recent Value  Care Plan Problem Three  Knowledge Deficits re: long term plan of care for management of DM  Role Documenting the Problem Three  Care  Management Coordinator  Care Plan for Problem Three  Active  THN Long Term Goal   Over the next 31 days, patient will verbalize understanding of long term plan of care for mangaement of DM  THN Long Term Goal Start Date  08/06/17  Interventions for Problem Three Long Term Goal  reviewed cbg findings and plan of care as etablished by endocroinology provider  [facility] CM Short Term Goal #1   Over the next 30 days, patient will cotinue to check and record cbg's daily, calling provider for findings < 80  THN CM Short Term Goal #1 Start Date  08/06/17  Interventions for Short Term Goal #1  reviewed  cbg findings with patient  THN CM Short Term Goal #2   Over the next 30 days, patient will verbalize understanding of signs and symptoms of hypoglycemia  THN CM Short Term Goal #2 Start Date  08/06/17  Interventions for Short Term Goal #2  reviewed signs and symptoms of hypo and hyerglycemia      [place] [facility]  [phone]

## 2017-08-12 NOTE — Telephone Encounter (Signed)
Forms faxed to [employer] at [phone]. Received fax confirmation. Form sent for scanning.

## 2017-08-12 NOTE — Telephone Encounter (Signed)
Received forms- completed- waiting for PCP signature.

## 2017-08-17 DIAGNOSIS — R040 Epistaxis: Secondary | ICD-10-CM | POA: Diagnosis not present

## 2017-08-18 DIAGNOSIS — I509 Heart failure, unspecified: Secondary | ICD-10-CM | POA: Diagnosis not present

## 2017-08-18 DIAGNOSIS — I2510 Atherosclerotic heart disease of native coronary artery without angina pectoris: Secondary | ICD-10-CM | POA: Diagnosis not present

## 2017-08-20 ENCOUNTER — Other Ambulatory Visit: Payer: Self-pay | Admitting: *Deleted

## 2017-08-20 DIAGNOSIS — L602 Onychogryphosis: Secondary | ICD-10-CM | POA: Diagnosis not present

## 2017-08-20 DIAGNOSIS — L84 Corns and callosities: Secondary | ICD-10-CM | POA: Diagnosis not present

## 2017-08-20 DIAGNOSIS — E1351 Other specified diabetes mellitus with diabetic peripheral angiopathy without gangrene: Secondary | ICD-10-CM | POA: Diagnosis not present

## 2017-08-20 NOTE — Patient Outreach (Signed)
[place] [facility]) Care Management  08/20/2017  [name] 01-02-1951 [id number]  [name] a 67 y.o.femalewith past medical history which includes diabetes mellitus type II, hyperlipidemia, hypertension, gastroesophageal reflux disease, hypothyroidism, obstructive sleep apnea, anemia, diverticulosis, degenerative joint disease with history of anterolisthesis L4-5, and depression. [name] has most recently visited the ED (x 3 over a 5 day period) with recurrent epistaxis. [name] was referred to [place] [facility] for assistance with chronic disease management and care coordination related to her ENT care needs.    I was unable to reach [name] today by phone.   Plan: I will reach out to [name] again by phone over the next 2 weeks.    [place] [facility]  [phone]

## 2017-08-21 ENCOUNTER — Other Ambulatory Visit: Payer: Self-pay | Admitting: *Deleted

## 2017-08-21 NOTE — Patient Outreach (Signed)
[place] [facility]) Care Management  08/21/2017  [name] 05/21/1950 [id number]  Call received from [name] today in response to the message I left for her yesterday:   New Health Condition (Recurrent Epistaxis)- [name] says her epistaxis is controlled and she has not had further nosebleeds since we last spoke. [name] has been scheduled to see the ENT provider which was changed from April 4 to next week due to scheduling conflict.    Transportation Concerns - [name] expressed some dissatisfaction with the transportation service she received this week and said she reached out to the service to explain that she does not text and needs to be called on the phone when her ride arrives. She is not scheduled to ride to an appointment with the service again until June. I advised that she call a day ahead to remind them that she needs to be called at the time of pickup.    Plan: I will follow up with [name] by phone over the next 3 weeks.   [name] will outreach to her provider office or me if she has questions or concerns.   THN CM Care Plan Problem One     Most Recent Value  Care Plan Problem One  Knowledge Deficits related to management of new health condition (epistaxis)   Role Documenting the Problem One  Care Management [name] for Problem One  Active  THN Long Term Goal   Over the next 31 days, patient will verbalize understanding of long term plan for management of epistaxis  THN Long Term Goal Start Date  08/06/17  Interventions for Problem One Long Term Goal  reviewed current status and plan of care  [facility] CM Short Term Goal #1   Over the next 3 days, patient will attend scheduled provider appointment  [facility] CM Short Term Goal #1 Start Date  08/06/17  [facility] CM Short Term Goal #1 Met Date  08/11/17  THN CM Short Term Goal #2   Over the next 30 days, patient will verbalize understanding of instructions re: treatment of epistaxiis  THN  CM Short Term Goal #2 Start Date  08/06/17  Interventions for Short Term Goal #2  reviewed action plan with patient    [facility] CM Care Plan Problem Two     Most Recent Value  Care Plan Problem Two  Knowledge Deficits re: Long Term Management of Hypertension  Role Documenting the Problem Two  Care Management Coordinator  Care Plan for Problem Two  Active  THN Long Term Goal  Over the next 31 days, patient will verbalize understanding of long term plan of care for management of hypertension  THN Long Term Goal Start Date  08/06/17  THN CM Short Term Goal #1   Over the next 3 days, patient will attend scheduled provider appointment  [facility] CM Short Term Goal #1 Start Date  08/06/17  [facility] CM Short Term Goal #1 Met Date   08/11/17  THN CM Short Term Goal #2   Over the next 30 days, patient will check bp daily and record as eviidenced by her report of same  THN CM Short Term Goal #2 Start Date  08/06/17  Interventions for Short Term Goal #2  discussed with patient need to self monitor bp daily  THN CM Short Term Goal #3   Over the next 30 days, patient will verbalize signs and symptoms of hyperension and when to call provider to report  [facility]  CM Short Term Goal #3 Start Date  08/06/17  Interventions for Short Term Goal #3  reviewed signs and symptoms and when to report    [facility] CM Care Plan Problem Three     Most Recent Value  THN CM Short Term Goal #2   Over the next 30 days, patient will verbalize understanding of signs and symptoms of hypoglycemia  THN CM Short Term Goal #2 Start Date  08/06/17      Janalyn Shy MHA,BSN,RN,CCM [facility] Care Management  [phone]

## 2017-09-01 ENCOUNTER — Other Ambulatory Visit: Payer: Self-pay | Admitting: *Deleted

## 2017-09-01 NOTE — Patient Outreach (Signed)
[place] [facility]) Care Management  09/01/2017  SHANTELLE ALLES 06-27-1950 [id number]  Telia Amundson Martinis a 67 y.o.femalewith past medical history which includes diabetes mellitus type II, hyperlipidemia, hypertension, gastroesophageal reflux disease, hypothyroidism, obstructive sleep apnea, anemia, diverticulosis, degenerative joint disease with history of anterolisthesis L4-5, and depression. Ms. Rozzell has most recently visited the ED (x 3 over a 5 day period) with recurrent epistaxis. Ms. Brittingham was referred to [place] [facility] for assistance with chronic disease management and care coordination related to her ENT care needs.  New Health Condition (Recurrent Epistaxis)- Ms. Amedee says her epistaxis is controlled and she has not had further nosebleeds. Mrs. Paulding has seen her ENT in follow up as scheduled.    Diabetes Management- Ms. Cullens reports that the doctor who manages her diabetes is Dr. Renato Shin. She has attended her scheduled provider appointments. She keeps a cbg journal and reports following a carb modified diet. I provided Emmi educational materials to her by mail. We have reviewed signs and symptoms of hypoglycemia. I also encouraged her to use the 24/7 nurse line if she experiences mild symptoms after hours or on the weekend.   Hypertension Management- Ms. Kosier reports that she is taking all prescribed medications including Cardiem CD 260 QD and Micardis 40mg  po qd. Ms. Novak has a "wrist" bp monitor and says she is using it intermittently but not regularly and is not recording bp findings. I advised that she record her bp daily and take the recorded information to her provider appointment next Friday. I discussed her prescribed heart healthy diet and mailed Emmi educational materials about hypertension management to Ms. Kreger also.   Nausea and Vomiting - Ms. Aguado reports that she is having intermittent problems "keeping food down". She says  this is a chronic intermittent problem and she plans to discuss this with her primary care provider, Dr. Larose Kells when she sees him at the office next Friday.   Transportation Concerns - Ms. Vitrano expressed some dissatisfaction with the transportation service she received 2 weeks ago and said she reached out to the service to explain that she does not text and needs to be called on the phone when her ride arrives. She tells me today that she called the [facility] contracted transportation service and asked to be scheduled for her PCP appointment transportation for next week Friday, May 3 @ 1pm - reminding the transportation service that she does not text and needs to be called when her ride arrives.   Food Resources - Ms. Hinesley asked today about food resources for her and asked specifically about Meals on Wheels. I reached out to the Meals on [employer] who took Ms. Nulty's information and advised that she would be put on a waiting list and would be contacted by a team social worker who would schedule a face to face appointment for interview for eligibility. I advised Ms. Forrey that she is on the Meals [employer] Whe[employer] intake waiting list.   Plan: I will reach out to [name] again by phone after her PCP appointment.   Ms. Selleck will call her provider team with any new or worsened symptoms.   Ms. Bowland will continue to adhere to her provider prescribed plan of care and medication regimen.   THN CM Care Plan Problem One     Most Recent Value  Care Plan Problem One  Knowledge Deficits related to management of new health condition (epistaxis)   Role Documenting the Problem One  Care Management Coordinator  Care Plan for Problem One  Active  THN Long Term Goal   Over the next 31 days, patient will verbalize understanding of long term plan for management of epistaxis  THN Long Term Goal Start Date  08/06/17  Interventions for Problem One Long Term Goal  reviewed current plan and provider  appointment schedule,  assesed patient's current status re: epistaxsis  THN CM Short Term Goal #1   Over the next 3 days, patient will attend scheduled provider appointment  [facility] CM Short Term Goal #1 Start Date  09/01/17  Interventions for Short Term Goal #1  reviewed currently scheduled provider appointment and transportation plans  [facility] CM Short Term Goal #2   Over the next 30 days, patient will verbalize understanding of instructions re: treatment of epistaxiis  THN CM Short Term Goal #2 Start Date  08/06/17  Interventions for Short Term Goal #2  reviewed plans for self management of epistaxsis occurences    [facility] CM Care Plan Problem Two     Most Recent Value  Care Plan Problem Two  Knowledge Deficits re: Long Term Management of Hypertension  Role Documenting the Problem Two  Care Management Coordinator  Care Plan for Problem Two  Active  Interventions for Problem Two Long Term Goal   reviewed current plan of care and medications  THN Long Term Goal  Over the next 31 days, patient will verbalize understanding of long term plan of care for management of hypertension  THN Long Term Goal Start Date  08/06/17  THN CM Short Term Goal #1   Over the next 3 days, patient will attend scheduled provider appointment  [facility] CM Short Term Goal #1 Start Date  09/01/17  Interventions for Short Term Goal #2   reviewed PCP appointment schedule and advised to haveh prepared questions for provider  [facility] CM Short Term Goal #2   Over the next 30 days, patient will check bp daily and record as eviidenced by her report of same  [facility] CM Short Term Goal #2 Start Date  08/06/17  Interventions for Short Term Goal #2  reviewed again with patient need for daily self monitoring  THN CM Short Term Goal #3   Over the next 30 days, patient will verbalize signs and symptoms of hyperension and when to call provider to report  [facility] CM Short Term Goal #3 Start Date  08/06/17  Interventions for Short Term Goal #3  reviewed  hypertension signs and symptoms with patient and when to report    [facility] CM Care Plan Problem Three     Most Recent Value  THN CM Short Term Goal #2   Over the next 30 days, patient will verbalize understanding of signs and symptoms of hypoglycemia  THN CM Short Term Goal #2 Start Date  08/06/17      Janalyn Shy [place] Care Management  [phone]

## 2017-09-03 ENCOUNTER — Ambulatory Visit: Payer: Self-pay | Admitting: *Deleted

## 2017-09-05 ENCOUNTER — Other Ambulatory Visit: Payer: Self-pay | Admitting: Endocrinology

## 2017-09-09 DIAGNOSIS — R0902 Hypoxemia: Secondary | ICD-10-CM | POA: Diagnosis not present

## 2017-09-09 DIAGNOSIS — G4733 Obstructive sleep apnea (adult) (pediatric): Secondary | ICD-10-CM | POA: Diagnosis not present

## 2017-09-11 ENCOUNTER — Ambulatory Visit (HOSPITAL_BASED_OUTPATIENT_CLINIC_OR_DEPARTMENT_OTHER)
Admission: RE | Admit: 2017-09-11 | Discharge: 2017-09-11 | Disposition: A | Discharge status: Home / Self Care | Payer: Medicare Other | Source: Ambulatory Visit | Attending: Internal Medicine | Admitting: Internal Medicine

## 2017-09-11 ENCOUNTER — Ambulatory Visit (INDEPENDENT_AMBULATORY_CARE_PROVIDER_SITE_OTHER): Payer: Medicare Other | Admitting: Internal Medicine

## 2017-09-11 ENCOUNTER — Encounter: Payer: Self-pay | Admitting: Internal Medicine

## 2017-09-11 VITALS — BP 146/88 | HR 107 | Temp 98.5°F | Resp 22 | Ht 69.0 in | Wt 287.0 lb

## 2017-09-11 DIAGNOSIS — J189 Pneumonia, unspecified organism: Secondary | ICD-10-CM | POA: Insufficient documentation

## 2017-09-11 DIAGNOSIS — J9611 Chronic respiratory failure with hypoxia: Secondary | ICD-10-CM

## 2017-09-11 DIAGNOSIS — E039 Hypothyroidism, unspecified: Secondary | ICD-10-CM | POA: Diagnosis not present

## 2017-09-11 DIAGNOSIS — R040 Epistaxis: Secondary | ICD-10-CM | POA: Diagnosis not present

## 2017-09-11 DIAGNOSIS — D649 Anemia, unspecified: Secondary | ICD-10-CM

## 2017-09-11 DIAGNOSIS — R9389 Abnormal findings on diagnostic imaging of other specified body structures: Secondary | ICD-10-CM | POA: Diagnosis not present

## 2017-09-11 DIAGNOSIS — R0602 Shortness of breath: Secondary | ICD-10-CM

## 2017-09-11 LAB — CBC WITH DIFFERENTIAL/PLATELET
Basophils Absolute: 0 K/uL (ref 0.0–0.1)
Basophils Relative: 0.6 % (ref 0.0–3.0)
Eosinophils Absolute: 0 K/uL (ref 0.0–0.7)
Eosinophils Relative: 0.3 % (ref 0.0–5.0)
HCT: 34.7 % — ABNORMAL LOW (ref 36.0–46.0)
Hemoglobin: 11.4 g/dL — ABNORMAL LOW (ref 12.0–15.0)
Lymphocytes Relative: 11.4 % — ABNORMAL LOW (ref 12.0–46.0)
Lymphs Abs: 0.8 K/uL (ref 0.7–4.0)
MCHC: 32.8 g/dL (ref 30.0–36.0)
MCV: 79.3 fl (ref 78.0–100.0)
Monocytes Absolute: 0.8 K/uL (ref 0.1–1.0)
Monocytes Relative: 10.8 % (ref 3.0–12.0)
Neutro Abs: 5.4 K/uL (ref 1.4–7.7)
Neutrophils Relative %: 76.9 % (ref 43.0–77.0)
Platelets: 382 K/uL (ref 150.0–400.0)
RBC: 4.38 Mil/uL (ref 3.87–5.11)
RDW: 19.4 % — ABNORMAL HIGH (ref 11.5–15.5)
WBC: 7 K/uL (ref 4.0–10.5)

## 2017-09-11 NOTE — Patient Instructions (Addendum)
GO TO THE LAB : Get the blood work     GO TO THE FRONT DESK Schedule your next appointment for a checkup in 3 or 4 months from today.  Counseled your appointment for May 20.  Take Symbicort twice a day every day whether you have cough/wheezing or not  Use your oxygen 24 hours a day   STOP BY THE FIRST FLOOR:  get the XR

## 2017-09-11 NOTE — Progress Notes (Signed)
Subjective:    Patient ID: [name], female    DOB: 05/14/1950, 67 y.o.   MRN: [id number]  DOS:  09/11/2017 Type of visit - description : f/u Interval history: See last visit, was noted to be hypoxic, oxygen recommended. Also found to have pneumonia. Status post doxycycline. Had severe nosebleeding, saw ENT, note reviewed.  No further nosebleeds.  BP Readings from Last 3 Encounters:  09/11/17 (!) 146/88  08/07/17 (!) 142/88  08/04/17 (!) 188/92    Review of Systems Overall feels well. Cough has decreased. No fever or chills. Not using Symbicort regularly, only "as needed". Denies difficulty breathing at rest, + DOE.  Past Medical History:  Diagnosis Date  . Anterolisthesis    Grade 1, L4-5  . Bronchospasm 05/28/2012  . CHEST PAIN 11/18/2007  . DEGENERATIVE JOINT DISEASE 10/06/2006  . DEPRESSION 09/26/2008  . DIABETES MELLITUS 10/06/2006  . Diverticulosis    [id number]  . GERD (gastroesophageal reflux disease) 07/25/2013  . HYPERLIPIDEMIA 01/11/2009  . HYPERTENSION 10/06/2006  . INSOMNIA 09/26/2008  . Internal hemorrhoids   . OBSTRUCTIVE SLEEP APNEA 06/23/2008   Severe OSA per sleep study 2010, Rx a CPAP  . Pain in joint, multiple sites 11/10/2006  . UNSPECIFIED ANEMIA 12/10/2009    Past Surgical History:  Procedure Laterality Date  . COLONOSCOPY  08/01/2011   Procedure: COLONOSCOPY;  Surgeon: Inda Castle, MD;  Location: [facility];  Service: Endoscopy;  Laterality: N/A;  . 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 Right knee replacement  2005    Social History   Socioeconomic History  . Marital status: Widowed    Spouse name: Not on file  . Number of children: 4  . Years of education: Not on file  . Highest education level: Not on file  Occupational History  . Occupation: disability    Employer: UNEMPLOYED  Social Needs  . Financial resource strain: Not on file  . Food insecurity:    Worry: Not on file    Inability: Not on file  . Transportation needs:   Medical: Not on file    Non-medical: Not on file  Tobacco Use  . Smoking status: Former Smoker    Packs/day: 0.20    Years: 4.00    Pack years: 0.80    Types: Cigarettes    Last attempt to quit: 05/13/1995    Years since quitting: 22.3  . Smokeless tobacco: Never Used  Substance and Sexual Activity  . Alcohol use: Yes    Comment: brandy once per wk  . Drug use: No  . Sexual activity: Yes  Lifestyle  . Physical activity:    Days per week: Not on file    Minutes per session: Not on file  . Stress: Not on file  Relationships  . Social connections:    Talks on phone: Not on file    Gets together: Not on file    Attends religious service: Not on file    Active member of club or organization: Not on file    Attends meetings of clubs or organizations: Not on file    Relationship status: Not on file  . Intimate partner violence:    Fear of current or ex partner: Not on file    Emotionally abused: Not on file    Physically abused: Not on file    Forced sexual activity: Not on file  Other Topics Concern  . Not on file  Social History Narrative   Widow , lives by herself  Lost a son, 3 living    Lost husband      Allergies as of 09/11/2017   No Known Allergies     Medication List        Accurate as of 09/11/17 11:59 PM. Always use your most recent med list.          ACCU-CHEK SOFTCLIX LANCETS lancets USE TO CHECK BLOOD SUGAR TWICE A DAY   albuterol 108 (90 Base) MCG/ACT inhaler Commonly known as:  PROVENTIL HFA;VENTOLIN HFA Inhale 2 puffs into the lungs every 4 (four) hours as needed for wheezing.   aspirin EC 81 MG tablet Take 81 mg by mouth daily.   budesonide-formoterol 160-4.5 MCG/ACT inhaler Commonly known as:  SYMBICORT Inhale 2 puffs into the lungs 2 (two) times daily.   diltiazem 360 MG 24 hr capsule Commonly known as:  CARDIZEM CD Take 1 capsule (360 mg total) by mouth daily.   Insulin Lispro Prot & Lispro (50-50) 100 UNIT/ML Kwikpen Commonly known  as:  HUMALOG MIX 50/50 KWIKPEN Inject 60 Units into the skin 2 (two) times daily with a meal. And pen needles 2/day   latanoprost 0.005 % ophthalmic solution Commonly known as:  XALATAN USE 1 DROP IN BOTH EYES AT BEDTIME   levothyroxine 75 MCG tablet Commonly known as:  SYNTHROID Take 1 tablet (75 mcg total) by mouth daily before breakfast.   OXYGEN Inhale 2 L into the lungs continuous.   potassium chloride 10 MEQ tablet Commonly known as:  K-DUR,KLOR-CON Take 1 tablet (10 mEq total) by mouth daily.   rosuvastatin 40 MG tablet Commonly known as:  CRESTOR Take 1 tablet (40 mg total) by mouth daily.   telmisartan 40 MG tablet Commonly known as:  MICARDIS Take 1 tablet (40 mg total) by mouth daily.          Objective:   Physical Exam BP (!) 146/88 (BP Location: Left Arm, Patient Position: Sitting, Cuff Size: Normal)   Pulse (!) 107   Temp 98.5 F (36.9 C) (Oral)   Resp (!) 22   Ht 5\' 9"  (1.753 m)   Wt 287 lb (130.2 kg)   SpO2 94%   BMI 42.38 kg/m  General:   Well developed, morbidly obese appearing. NAD.  HEENT:  Normocephalic . Face symmetric, atraumatic Lungs:  Slightly decreased breath sounds but clear Normal respiratory effort, no intercostal retractions, no accessory muscle use. Heart: RRR,  no murmur.  No pretibial edema bilaterally  Skin: Not pale. Not jaundice Neurologic:  alert & oriented X3.  Speech normal, gait unassisted, somewhat limited by BMI Psych--  Cognition and judgment appear intact.  Cooperative with normal attention span and concentration.  Behavior appropriate. No anxious or depressed appearing.      Assessment & Plan:   Assessment DM per endocrinology HTN Hyperlipidemia For this in the next 2018 Depression, insomnia (citalopram caused headache ? 05-2014) MSK: --DJD, saw ortho ~06-2014 --Multiple joint pains, 2008, sedimentation rate, RA >> negative --Back pain>> -- MRI 2007: Epidural lipomatosis, congenitally short pedicles,  spondylosis, and disc bulges and protrusions contribute to central, foraminal, and subarticular lateral recess stenoses as detailed above.  --2008, saw Ortho, Rx local injections  --local injections back Dr Mina Marble  ~ 12-2015 GERD Morbid obesity Pulmonary -Chronic respiratory failure  -asthma -OSA dx 2010, severe, nocturnal desaturation, intolerant to CPAP, on nocturnal O2 -ONO 01/2017:  + Hypoxia at night, rec  to continue oxygen at night unless able  to tolerate a CPAP which she cannot DOE, chronic  CP, chronic RUQ pain:: 2004--Cath neg, areterial LE dopplers (-) 06-2003-- abd. u/s fatty liver 01-2005-- CP...neg u/s GB, neg HIDA 10-06--saw cards--cardiolite neg 12-2007 abnormal stress test Cath 02-01-08: normal coronaries  chest pain, stress test echo okay 09/2016   PLAN: Nosebleeds: Saw ENT, note reviewed, they elected not to cauterize a visible vessel at the nostril, so far doing well with conservative treatment Respiratory failure: not using O2 at the time of the visit, O2 sat upon arrival 85%, after she rested went up to 94%.  No SOB at rest.  Recommend to use oxygen 24 hours a day.  Her challenges that the portable unit is very heavy. Pneumonia: After last visit she was diagnosed with pneumonia, took doxycycline, she had cough, that has improved.  Recheck a chest x-ray. Mild anemia noted: Recheck CBC today HTN: BP slightly elevated today, no change for now Hypothyroidism: Last TSH elevated, thyroid dose increased, check a TSH Asthma: Taking Symbicort as needed, recommend to take it twice a day regardless of symptoms. RTC 4 months

## 2017-09-11 NOTE — Progress Notes (Signed)
Pre visit review using our clinic review tool, if applicable. No additional management support is needed unless otherwise documented below in the visit note. 

## 2017-09-12 LAB — TSH: TSH: 13.42 u[IU]/mL — AB (ref 0.35–4.50)

## 2017-09-12 NOTE — Assessment & Plan Note (Signed)
Nosebleeds: Saw ENT, note reviewed, they elected not to cauterize a visible vessel at the nostril, so far doing well with conservative treatment Respiratory failure: not using O2 at the time of the visit, O2 sat upon arrival 85%, after she rested went up to 94%.  No SOB at rest.  Recommend to use oxygen 24 hours a day.  Her challenges that the portable unit is very heavy. Pneumonia: After last visit she was diagnosed with pneumonia, took doxycycline, she had cough, that has improved.  Recheck a chest x-ray. Mild anemia noted: Recheck CBC today HTN: BP slightly elevated today, no change for now Hypothyroidism: Last TSH elevated, thyroid dose increased, check a TSH Asthma: Taking Symbicort as needed, recommend to take it twice a day regardless of symptoms. RTC 4 months

## 2017-09-14 ENCOUNTER — Other Ambulatory Visit: Payer: Self-pay | Admitting: Internal Medicine

## 2017-09-14 MED ORDER — LEVOTHYROXINE SODIUM 112 MCG PO TABS: 112.0000 ug | ORAL_TABLET | Freq: Every day | ORAL | 1 refills | Status: DC

## 2017-09-14 NOTE — Addendum Note (Signed)
Addended byDamita Dunnings D on: 09/14/2017 01:45 PM   Modules accepted: Orders

## 2017-09-15 ENCOUNTER — Ambulatory Visit: Payer: Self-pay | Admitting: *Deleted

## 2017-09-17 DIAGNOSIS — I2510 Atherosclerotic heart disease of native coronary artery without angina pectoris: Secondary | ICD-10-CM | POA: Diagnosis not present

## 2017-09-17 DIAGNOSIS — I509 Heart failure, unspecified: Secondary | ICD-10-CM | POA: Diagnosis not present

## 2017-09-28 ENCOUNTER — Ambulatory Visit: Payer: Medicare Other | Admitting: Internal Medicine

## 2017-09-30 ENCOUNTER — Other Ambulatory Visit: Payer: Self-pay | Admitting: Internal Medicine

## 2017-10-05 ENCOUNTER — Other Ambulatory Visit: Payer: Self-pay | Admitting: Endocrinology

## 2017-10-10 DIAGNOSIS — R0902 Hypoxemia: Secondary | ICD-10-CM | POA: Diagnosis not present

## 2017-10-10 DIAGNOSIS — G4733 Obstructive sleep apnea (adult) (pediatric): Secondary | ICD-10-CM | POA: Diagnosis not present

## 2017-10-12 ENCOUNTER — Ambulatory Visit: Payer: Medicare Other | Admitting: *Deleted

## 2017-10-14 ENCOUNTER — Other Ambulatory Visit: Payer: Self-pay | Admitting: *Deleted

## 2017-10-14 NOTE — Patient Outreach (Addendum)
[facility]) Care Management  10/14/2017  [name] Apr 03, 1951 [id number]  Melissa Miguez Martinis a 67 y.o.femalewith past medical history which includes diabetes mellitus type II, hyperlipidemia, hypertension, gastroesophageal reflux disease, hypothyroidism, obstructive sleep apnea, anemia, diverticulosis, degenerative joint disease with history of anterolisthesis L4-5, and depression. [name] has most recently visited the ED (x 3 over a 5 day period) with recurrent epistaxis. [name] was referred to [facility] for assistance with chronic disease management and care coordination related to her ENT care needs.  Appointment Scheduling/Coordination CT Chest - I spoke with [name] by phone today. She says her primary concern today is getting a CT scan rescheduled to an earlier date at [facility] as recommended by Dr. Larose Kells. She was unsure of the purpose of the CT scan but knew that Dr. Larose Kells ordered it and understood she was scheduled for 11/02/17. I contacted the radiology department at [facility] and was told that [name] is not currently scheduled for any diagnostic procedure with them. I called the office of Dr. Larose Kells and spoke with [name] who was able to determine that an order for CT Chest w/o Contrast was placed by Dr. Larose Kells. [name] put me in touch with [name] who advised that [name]'s CT was ordered to be [name] there at the [place] office on the first floor. I contacted the radiology department @ [phone] and spoke with Hoyle Sauer who stated. [name] could come at any time for her CT, even as a "walk in". I relayed this message to [name] who asked if I could help her with scheduling transportation for this appointment on 10/26/17. I reached out to [facility] transportation service and requested assistance and notified Hoyle Sauer that [name] anticipated coming on Monday 10/26/17 at 1:30pm.   I provided [name] with the following  information re: her transportation:  Pick up on 10/26/17 between 12:15-12:45 for a 1:30pm appointment at [facility]. [place] number: [phone] [place] Number for [place]: [phone].  Bronchospasm/Shortness of Breath - [name]'s primary complaint today is of respiratory decline. She says her breathing has been worse over the last few months. She reports that she has been short of breath "off and on" today but is resting, using her prescribed maintenance and rescue inhaler, and O2 as prescribed with good results. Because of her shortness of breath today, she did not want to discuss her other health conditions. I briefly reviewed symptoms for which [name] should get in contact with her provider. [name] agreed to a call next week to inquire about her progress.    Plan: We will follow up with [name] over the next week to address her progress, other health needs/concerns, and any care coordination needs.   [facility] CM Care Plan Problem One     Most Recent Value  Care Plan Problem One  Care Coordination and Education Needs re: Respiratory Failure management  Role Documenting the Problem One  Care Management Telephonic Coordinator  Care Plan for Problem One  Active  THN Long Term Goal   Over the next 60 days, patient will verbalize receipt of coordination services and will verbalize undertanding of plan of care for management of respiratory failure  THN Long Term Goal Start Date  10/14/17  Interventions for Problem One Long Term Goal  discussed patient's current respiratory status and understood plan of care  THN CM Short Term Goal #1   Over the next  30 days, patient will attend all scheduled provider and diagnostic appointments  THN CM Short Term Goal #1 Start Date  10/14/17  Interventions for Short Term Goal #1  collaboration with provider offices and transportation services re: 10/26/17 appointment for CT chest  THN CM Short Term Goal #2   Over the next 30 days, patient will  report any new or worsened symptoms to PCP as soon as noted  THN CM Short Term Goal #2 Start Date  10/14/17  Interventions for Short Term Goal #2  discussed importance of early reporting of symptoms,  reviewed signs and symptoms of worsening respiratory condition    THN CM Care Plan Problem Two     Most Recent Value  Care Plan Problem Two  Knowledge Deficits re: Long Term Management of Hypertension  Role Documenting the Problem Two  Care Management Coordinator  Care Plan for Problem Two  Active  Interventions for Problem Two Long Term Goal   brief review of rationale for intentional self health management of HTN  THN Long Term Goal  Over the next 60 days, patient will verbalize understanding of long term plan of care for management of HTN  THN Long Term Goal Start Date  09/01/17  THN CM Short Term Goal #1   Over the next 3 days, patient will attend scheduled provider appointment  [facility] CM Short Term Goal #1 Start Date  09/01/17  [facility] CM Short Term Goal #1 Met Date   10/14/17  THN CM Short Term Goal #2   Over the next 30 days, patient will check bp daily and record as eviidenced by her report of same  THN CM Short Term Goal #2 Start Date  09/01/17  [facility] CM Short Term Goal #3   Over the next 30 days, patient will verbalize signs and symptoms of hyperension and when to call provider to report  [facility] CM Short Term Goal #3 Start Date  09/01/17  Interventions for Short Term Goal #3  brief review of rationale of self health monitoring    [facility] CM Care Plan Problem Three     Most Recent Value  Care Plan Problem Three  Knowledge Deficits re: long term plan of care for management of DM  Role Documenting the Problem Three  Care Management Coordinator  Care Plan for Problem Three  Active  THN Long Term Goal   Over the next 60 days, patient will verbalize understanding of long term plan of care for mangaement of DM  THN Long Term Goal Start Date  09/01/17  Interventions for Problem Three Long Term Goal   patient declined discussion secondary to report of fatigue and shortness of breath  THN CM Short Term Goal #1   Over the next 30 days, patient will cotinue to check and record cbg's daily, calling provider for findings < 80  THN CM Short Term Goal #1 Start Date  10/14/17  Interventions for Short Term Goal #1  patient declined discussion  THN CM Short Term Goal #2   Over the next 30 days, patient will verbalize understanding of signs and symptoms of hypoglycemia  THN CM Short Term Goal #2 Start Date  10/14/17  Interventions for Short Term Goal #2  patient declined discussion      [place] [facility]  [phone]

## 2017-10-15 ENCOUNTER — Other Ambulatory Visit: Payer: Self-pay | Admitting: Endocrinology

## 2017-10-15 ENCOUNTER — Other Ambulatory Visit: Payer: Self-pay | Admitting: Internal Medicine

## 2017-10-16 ENCOUNTER — Ambulatory Visit (HOSPITAL_BASED_OUTPATIENT_CLINIC_OR_DEPARTMENT_OTHER): Payer: Medicare Other

## 2017-10-18 DIAGNOSIS — I509 Heart failure, unspecified: Secondary | ICD-10-CM | POA: Diagnosis not present

## 2017-10-18 DIAGNOSIS — I2510 Atherosclerotic heart disease of native coronary artery without angina pectoris: Secondary | ICD-10-CM | POA: Diagnosis not present

## 2017-10-19 ENCOUNTER — Inpatient Hospital Stay (HOSPITAL_COMMUNITY)
Admission: EM | Admit: 2017-10-19 | Discharge: 2017-10-24 | DRG: 286 | Disposition: A | Discharge status: Home / Self Care | Payer: Medicare Other | Attending: Family Medicine | Admitting: Family Medicine

## 2017-10-19 ENCOUNTER — Ambulatory Visit (INDEPENDENT_AMBULATORY_CARE_PROVIDER_SITE_OTHER): Payer: Medicare Other | Admitting: Neurology

## 2017-10-19 ENCOUNTER — Encounter (HOSPITAL_COMMUNITY): Payer: Self-pay | Admitting: Emergency Medicine

## 2017-10-19 ENCOUNTER — Encounter: Payer: Self-pay | Admitting: Neurology

## 2017-10-19 ENCOUNTER — Emergency Department (HOSPITAL_COMMUNITY): Payer: Medicare Other

## 2017-10-19 ENCOUNTER — Telehealth: Payer: Self-pay | Admitting: Neurology

## 2017-10-19 VITALS — BP 141/98 | HR 110 | Ht 69.0 in | Wt 287.0 lb

## 2017-10-19 DIAGNOSIS — M542 Cervicalgia: Secondary | ICD-10-CM | POA: Diagnosis not present

## 2017-10-19 DIAGNOSIS — E1165 Type 2 diabetes mellitus with hyperglycemia: Secondary | ICD-10-CM | POA: Diagnosis not present

## 2017-10-19 DIAGNOSIS — I319 Disease of pericardium, unspecified: Secondary | ICD-10-CM | POA: Diagnosis present

## 2017-10-19 DIAGNOSIS — R918 Other nonspecific abnormal finding of lung field: Secondary | ICD-10-CM | POA: Diagnosis not present

## 2017-10-19 DIAGNOSIS — J45909 Unspecified asthma, uncomplicated: Secondary | ICD-10-CM | POA: Diagnosis present

## 2017-10-19 DIAGNOSIS — G4733 Obstructive sleep apnea (adult) (pediatric): Secondary | ICD-10-CM | POA: Diagnosis present

## 2017-10-19 DIAGNOSIS — N182 Chronic kidney disease, stage 2 (mild): Secondary | ICD-10-CM | POA: Diagnosis present

## 2017-10-19 DIAGNOSIS — I130 Hypertensive heart and chronic kidney disease with heart failure and stage 1 through stage 4 chronic kidney disease, or unspecified chronic kidney disease: Secondary | ICD-10-CM | POA: Diagnosis not present

## 2017-10-19 DIAGNOSIS — R0789 Other chest pain: Secondary | ICD-10-CM | POA: Diagnosis not present

## 2017-10-19 DIAGNOSIS — Z8249 Family history of ischemic heart disease and other diseases of the circulatory system: Secondary | ICD-10-CM

## 2017-10-19 DIAGNOSIS — E119 Type 2 diabetes mellitus without complications: Secondary | ICD-10-CM

## 2017-10-19 DIAGNOSIS — J811 Chronic pulmonary edema: Secondary | ICD-10-CM

## 2017-10-19 DIAGNOSIS — I10 Essential (primary) hypertension: Secondary | ICD-10-CM | POA: Diagnosis present

## 2017-10-19 DIAGNOSIS — Z79899 Other long term (current) drug therapy: Secondary | ICD-10-CM

## 2017-10-19 DIAGNOSIS — Z794 Long term (current) use of insulin: Secondary | ICD-10-CM | POA: Diagnosis not present

## 2017-10-19 DIAGNOSIS — I3139 Other pericardial effusion (noninflammatory): Secondary | ICD-10-CM

## 2017-10-19 DIAGNOSIS — Z9981 Dependence on supplemental oxygen: Secondary | ICD-10-CM

## 2017-10-19 DIAGNOSIS — J810 Acute pulmonary edema: Secondary | ICD-10-CM | POA: Diagnosis not present

## 2017-10-19 DIAGNOSIS — I700 Atherosclerosis of aorta: Secondary | ICD-10-CM | POA: Diagnosis not present

## 2017-10-19 DIAGNOSIS — R079 Chest pain, unspecified: Secondary | ICD-10-CM

## 2017-10-19 DIAGNOSIS — J9621 Acute and chronic respiratory failure with hypoxia: Secondary | ICD-10-CM | POA: Diagnosis present

## 2017-10-19 DIAGNOSIS — Z9989 Dependence on other enabling machines and devices: Secondary | ICD-10-CM

## 2017-10-19 DIAGNOSIS — I313 Pericardial effusion (noninflammatory): Secondary | ICD-10-CM | POA: Diagnosis not present

## 2017-10-19 DIAGNOSIS — F17210 Nicotine dependence, cigarettes, uncomplicated: Secondary | ICD-10-CM | POA: Diagnosis present

## 2017-10-19 DIAGNOSIS — Z7982 Long term (current) use of aspirin: Secondary | ICD-10-CM

## 2017-10-19 DIAGNOSIS — E11649 Type 2 diabetes mellitus with hypoglycemia without coma: Secondary | ICD-10-CM | POA: Diagnosis not present

## 2017-10-19 DIAGNOSIS — R202 Paresthesia of skin: Secondary | ICD-10-CM | POA: Diagnosis not present

## 2017-10-19 DIAGNOSIS — I5033 Acute on chronic diastolic (congestive) heart failure: Secondary | ICD-10-CM | POA: Diagnosis not present

## 2017-10-19 DIAGNOSIS — E7800 Pure hypercholesterolemia, unspecified: Secondary | ICD-10-CM | POA: Diagnosis not present

## 2017-10-19 DIAGNOSIS — R52 Pain, unspecified: Secondary | ICD-10-CM | POA: Diagnosis not present

## 2017-10-19 DIAGNOSIS — K219 Gastro-esophageal reflux disease without esophagitis: Secondary | ICD-10-CM | POA: Diagnosis present

## 2017-10-19 DIAGNOSIS — R531 Weakness: Secondary | ICD-10-CM

## 2017-10-19 DIAGNOSIS — E1122 Type 2 diabetes mellitus with diabetic chronic kidney disease: Secondary | ICD-10-CM | POA: Diagnosis present

## 2017-10-19 DIAGNOSIS — E785 Hyperlipidemia, unspecified: Secondary | ICD-10-CM | POA: Diagnosis not present

## 2017-10-19 DIAGNOSIS — I2510 Atherosclerotic heart disease of native coronary artery without angina pectoris: Secondary | ICD-10-CM | POA: Diagnosis not present

## 2017-10-19 DIAGNOSIS — R0600 Dyspnea, unspecified: Secondary | ICD-10-CM | POA: Diagnosis not present

## 2017-10-19 DIAGNOSIS — R072 Precordial pain: Secondary | ICD-10-CM | POA: Diagnosis not present

## 2017-10-19 DIAGNOSIS — E039 Hypothyroidism, unspecified: Secondary | ICD-10-CM | POA: Diagnosis present

## 2017-10-19 DIAGNOSIS — D649 Anemia, unspecified: Secondary | ICD-10-CM | POA: Diagnosis not present

## 2017-10-19 DIAGNOSIS — R000 Tachycardia, unspecified: Secondary | ICD-10-CM | POA: Diagnosis not present

## 2017-10-19 DIAGNOSIS — R0602 Shortness of breath: Secondary | ICD-10-CM

## 2017-10-19 DIAGNOSIS — J9601 Acute respiratory failure with hypoxia: Secondary | ICD-10-CM | POA: Diagnosis not present

## 2017-10-19 DIAGNOSIS — Z96653 Presence of artificial knee joint, bilateral: Secondary | ICD-10-CM | POA: Diagnosis present

## 2017-10-19 DIAGNOSIS — R768 Other specified abnormal immunological findings in serum: Secondary | ICD-10-CM | POA: Diagnosis present

## 2017-10-19 DIAGNOSIS — Z7951 Long term (current) use of inhaled steroids: Secondary | ICD-10-CM

## 2017-10-19 DIAGNOSIS — I371 Nonrheumatic pulmonary valve insufficiency: Secondary | ICD-10-CM | POA: Diagnosis not present

## 2017-10-19 DIAGNOSIS — Z6841 Body Mass Index (BMI) 40.0 and over, adult: Secondary | ICD-10-CM

## 2017-10-19 LAB — BASIC METABOLIC PANEL
Anion gap: 10 (ref 5–15)
BUN: 12 mg/dL (ref 6–20)
CO2: 24 mmol/L (ref 22–32)
CREATININE: 1.08 mg/dL — AB (ref 0.44–1.00)
Calcium: 8.6 mg/dL — ABNORMAL LOW (ref 8.9–10.3)
Chloride: 105 mmol/L (ref 101–111)
GFR calc non Af Amer: 52 mL/min — ABNORMAL LOW (ref >60)
GFR, EST AFRICAN AMERICAN: 60 mL/min — AB (ref >60)
Glucose, Bld: 39 mg/dL — CL (ref 65–99)
Potassium: 3.1 mmol/L — ABNORMAL LOW (ref 3.5–5.1)
SODIUM: 139 mmol/L (ref 135–145)

## 2017-10-19 LAB — CBC
HCT: 35.7 % — ABNORMAL LOW (ref 36.0–46.0)
Hemoglobin: 11.2 g/dL — ABNORMAL LOW (ref 12.0–15.0)
MCH: 24.1 pg — AB (ref 26.0–34.0)
MCHC: 31.4 g/dL (ref 30.0–36.0)
MCV: 76.9 fL — ABNORMAL LOW (ref 78.0–100.0)
Platelets: 457 10*3/uL — ABNORMAL HIGH (ref 150–400)
RBC: 4.64 MIL/uL (ref 3.87–5.11)
RDW: 19.9 % — AB (ref 11.5–15.5)
WBC: 6.4 10*3/uL (ref 4.0–10.5)

## 2017-10-19 LAB — I-STAT TROPONIN, ED: TROPONIN I, POC: 0.04 ng/mL (ref 0.00–0.08)

## 2017-10-19 NOTE — ED Triage Notes (Signed)
Pt reports SOB X mo, pt also reports CP. Pt states she has been seen here multiple times for same. O2 sats 85% on RA, pt was PRN.

## 2017-10-19 NOTE — Telephone Encounter (Signed)
[employer] medicare/medicaid order sent to GI. No auth they will reach out to the pt to schedule.  °

## 2017-10-19 NOTE — Progress Notes (Signed)
PATIENT: [name] DOB: [birth year]  Chief Complaint  Patient presents with  . New Patient (Initial Visit)    Patient here alone. Pt c/o numbness in her fingers.      HISTORICAL  [name] is a 67 year old female, seen in refer by primary care physician Dr. Larose Kells, [facility] E for evaluation of left hand numbness, weakness, initial evaluation was on October 19, 2017.  Patient was dropped off by transportation, was overloaded at today's visit, she is setting a wheelchair, in moderate distress, supposed to use oxygen 24/7," but my oxygen tank was too heavy for me to carry", she did not bring her oxygen, complains of shortness of breath, fatigue, she also has past medical history of diabetes insulin-dependent for many years, hypertension, hyperlipidemia, hypothyroidism, on supplement, complains of diffuse body achy pain,  She reported left hand paresthesia over past 7 years, gradually getting worse, numbness from mid elbow down, subjective weakness, drop things from her left hand, also complains of neck pain, radiating pain to bilateral shoulder, more to the left side,  She complains of diffuse body achy pain, gait abnormality, short winded with short distance, also has history of bilateral knee replacement,   Laboratory evaluation in 2019 showed elevated TSH 13.4, previously was 28, A1c was 6.8  REVIEW OF SYSTEMS: Full 14 system review of systems performed and notable only for weight gain, chest pain, ringing in ears, blurred vision, shortness of breath, cough, wheezing, easy bruising, increased thirst, joint pain, aching muscles, numbness, depression, anxiety, not enough sleep  ALLERGIES: No Known Allergies  HOME MEDICATIONS: Current Outpatient Medications  Medication Sig Dispense Refill  . ACCU-CHEK AVIVA PLUS test strip TEST BLOD SUGAR TWICE DAILY AND LANCETS TWICE DAILY 200 each 0  . ACCU-CHEK SOFTCLIX LANCETS lancets USE TO CHECK BLOOD SUGAR TWICE A DAY 100 each 0  .  albuterol (PROVENTIL HFA;VENTOLIN HFA) 108 (90 BASE) MCG/ACT inhaler Inhale 2 puffs into the lungs every 4 (four) hours as needed for wheezing. 1 Inhaler 6  . aspirin EC 81 MG tablet Take 81 mg by mouth daily.    . budesonide-formoterol (SYMBICORT) 160-4.5 MCG/ACT inhaler Inhale 2 puffs into the lungs 2 (two) times daily. (Patient taking differently: Inhale 2 puffs into the lungs as needed. ) 10.2 g 5  . diltiazem (CARDIZEM CD) 360 MG 24 hr capsule Take 1 capsule (360 mg total) by mouth daily. 90 capsule 1  . Insulin Lispro Prot & Lispro (HUMALOG MIX 50/50 KWIKPEN) (50-50) 100 UNIT/ML Kwikpen Inject 60 Units into the skin 2 (two) times daily with a meal. And pen needles 2/day 15 pen 11  . latanoprost (XALATAN) 0.005 % ophthalmic solution USE 1 DROP IN BOTH EYES AT BEDTIME  10  . levothyroxine (SYNTHROID, LEVOTHROID) 50 MCG tablet Take 50 mcg by mouth daily before breakfast.    . OXYGEN Inhale 2 L into the lungs continuous.    . potassium chloride (K-DUR,KLOR-CON) 10 MEQ tablet TAKE 1 TABLET(10 MEQ) BY MOUTH DAILY 90 tablet 0  . rosuvastatin (CRESTOR) 40 MG tablet Take 1 tablet (40 mg total) by mouth daily. 90 tablet 1  . telmisartan (MICARDIS) 40 MG tablet Take 1 tablet (40 mg total) by mouth daily. 90 tablet 1   No current facility-administered medications for this visit.     PAST MEDICAL HISTORY: Past Medical History:  Diagnosis Date  . Anterolisthesis    Grade 1, L4-5  . Bronchospasm 05/28/2012  . CHEST PAIN 11/18/2007  . DEGENERATIVE JOINT DISEASE 10/06/2006  .  DEPRESSION 09/26/2008  . DIABETES MELLITUS 10/06/2006  . Diverticulosis    [id number]  . GERD (gastroesophageal reflux disease) 07/25/2013  . HYPERLIPIDEMIA 01/11/2009  . HYPERTENSION 10/06/2006  . INSOMNIA 09/26/2008  . Internal hemorrhoids   . OBSTRUCTIVE SLEEP APNEA 06/23/2008   Severe OSA per sleep study 2010, Rx a CPAP  . Pain in joint, multiple sites 11/10/2006  . UNSPECIFIED ANEMIA 12/10/2009    PAST SURGICAL HISTORY: Past  Surgical History:  Procedure Laterality Date  . COLONOSCOPY  08/01/2011   Procedure: COLONOSCOPY;  Surgeon: Inda Castle, MD;  Location: [facility];  Service: Endoscopy;  Laterality: N/A;  . 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 Right knee replacement  2005    FAMILY HISTORY: Family History  Problem Relation Age of Onset  . Asthma Mother   . Stroke Mother   . Diabetes Other        M, B, S  . Hypertension Sister        M, S,B  . Pancreatic cancer Brother   . Colon cancer Neg Hx   . Prostate cancer Neg Hx   . Breast cancer Neg Hx     SOCIAL HISTORY:  Social History   Socioeconomic History  . Marital status: Widowed    Spouse name: Not on file  . Number of children: 4  . Years of education: Not on file  . Highest education level: Not on file  Occupational History  . Occupation: disability    Employer: UNEMPLOYED  Social Needs  . Financial resource strain: Not on file  . Food insecurity:    Worry: Not on file    Inability: Not on file  . Transportation needs:    Medical: Not on file    Non-medical: Not on file  Tobacco Use  . Smoking status: Current Some Day Smoker    Packs/day: 0.20    Years: 4.00    Pack years: 0.80    Types: Cigarettes    Last attempt to quit: 05/13/1995    Years since quitting: 22.4  . Smokeless tobacco: Never Used  Substance and Sexual Activity  . Alcohol use: Yes    Comment: brandy once per wk  . Drug use: No  . Sexual activity: Yes  Lifestyle  . Physical activity:    Days per week: Not on file    Minutes per session: Not on file  . Stress: Not on file  Relationships  . Social connections:    Talks on phone: Not on file    Gets together: Not on file    Attends religious service: Not on file    Active member of club or organization: Not on file    Attends meetings of clubs or organizations: Not on file    Relationship status: Not on file  . Intimate partner violence:    Fear of current or ex partner: Not on file    Emotionally  abused: Not on file    Physically abused: Not on file    Forced sexual activity: Not on file  Other Topics Concern  . Not on file  Social History Narrative   Widow , lives by herself   Lost a son, 3 living    Lost husband     PHYSICAL EXAM   Vitals:   10/19/17 1319  BP: (!) 141/98  Pulse: (!) 110  Weight: 287 lb (130.2 kg)  Height: 5\' 9"  (1.753 m)    Not recorded      Body mass index  is 42.38 kg/m.  PHYSICAL EXAMNIATION:  Gen: NAD, conversant, well nourised, obese, well groomed                     Cardiovascular: Regular rate rhythm, no peripheral edema, warm, nontender. Eyes: Conjunctivae clear without exudates or hemorrhage Neck: Supple, no carotid bruits. Pulmonary: Clear to auscultation bilaterally   NEUROLOGICAL EXAM: Depressed healthy lady,  MENTAL STATUS: Speech:    Speech is normal; fluent and spontaneous with normal comprehension.  Cognition:     Orientation to time, place and person     Normal recent and remote memory     Normal Attention span and concentration     Normal Language, naming, repeating,spontaneous speech     Fund of knowledge   CRANIAL NERVES: CN II: Visual fields are full to confrontation.  Pupils are round equal and briskly reactive to light. CN III, IV, VI: extraocular movement are normal. No ptosis. CN V: Facial sensation is intact to pinprick in all 3 divisions bilaterally. Corneal responses are intact.  CN VII: Face is symmetric with normal eye closure and smile. CN VIII: Hearing is normal to rubbing fingers CN IX, X: Palate elevates symmetrically. Phonation is normal. CN XI: Head turning and shoulder shrug are intact CN XII: Tongue is midline with normal movements and no atrophy.  MOTOR: There is no pronator drift of out-stretched arms. Muscle bulk and tone are normal. Muscle strength testing showed variable effort, there was no significant weakness noticed. REFLEXES: Reflexes are 2+ and symmetric at the biceps, triceps,  knees, and ankles. Plantar responses are flexor.  SENSORY: Intact to light touch, pinprick, positional sensation and vibratory sensation are intact in fingers and toes.  COORDINATION: Rapid alternating movements and fine finger movements are intact. There is no dysmetria on finger-to-nose and heel-knee-shin.    GAIT/STANCE: Multiple attempts, push on chair arm to get up from seated position, difficulty to initiate gait, unsteady   DIAGNOSTIC DATA (LABS, IMAGING, TESTING) - I reviewed patient records, labs, notes, testing and imaging myself where available.   ASSESSMENT AND PLAN  LESHAY DESAULNIERS is a 67 y.o. female   Gait abnormality  This could due to multifactorial, overweight, deconditioning, bilateral knee pain, diffuse body achy pain,  Need to rule out cervical myelopathy  Proceed with MRI of cervical Neck pain, radiating pain to left upper extremity, left hand paresthesia,  Left carpal tunnel syndromes versus left cervical radiculopathy  Laboratory evaluations  EMG nerve conduction study   Marcial Pacas, M.D. Ph.D.  [facility] [address] [place], [place] [address] Ph: [phone] Fax: [phone]  CC: Colon Branch, MD

## 2017-10-20 ENCOUNTER — Emergency Department (HOSPITAL_COMMUNITY): Payer: Medicare Other

## 2017-10-20 ENCOUNTER — Other Ambulatory Visit: Payer: Self-pay

## 2017-10-20 ENCOUNTER — Inpatient Hospital Stay (HOSPITAL_COMMUNITY): Payer: Medicare Other

## 2017-10-20 ENCOUNTER — Encounter (HOSPITAL_COMMUNITY): Payer: Self-pay | Admitting: Internal Medicine

## 2017-10-20 DIAGNOSIS — R768 Other specified abnormal immunological findings in serum: Secondary | ICD-10-CM | POA: Diagnosis present

## 2017-10-20 DIAGNOSIS — R0789 Other chest pain: Secondary | ICD-10-CM | POA: Diagnosis not present

## 2017-10-20 DIAGNOSIS — E1165 Type 2 diabetes mellitus with hyperglycemia: Secondary | ICD-10-CM | POA: Diagnosis present

## 2017-10-20 DIAGNOSIS — I371 Nonrheumatic pulmonary valve insufficiency: Secondary | ICD-10-CM | POA: Diagnosis not present

## 2017-10-20 DIAGNOSIS — E119 Type 2 diabetes mellitus without complications: Secondary | ICD-10-CM | POA: Diagnosis not present

## 2017-10-20 DIAGNOSIS — I2510 Atherosclerotic heart disease of native coronary artery without angina pectoris: Secondary | ICD-10-CM | POA: Diagnosis present

## 2017-10-20 DIAGNOSIS — I700 Atherosclerosis of aorta: Secondary | ICD-10-CM | POA: Diagnosis present

## 2017-10-20 DIAGNOSIS — I313 Pericardial effusion (noninflammatory): Secondary | ICD-10-CM | POA: Diagnosis not present

## 2017-10-20 DIAGNOSIS — J9601 Acute respiratory failure with hypoxia: Secondary | ICD-10-CM | POA: Diagnosis not present

## 2017-10-20 DIAGNOSIS — R0600 Dyspnea, unspecified: Secondary | ICD-10-CM

## 2017-10-20 DIAGNOSIS — J45909 Unspecified asthma, uncomplicated: Secondary | ICD-10-CM | POA: Diagnosis not present

## 2017-10-20 DIAGNOSIS — N182 Chronic kidney disease, stage 2 (mild): Secondary | ICD-10-CM | POA: Diagnosis present

## 2017-10-20 DIAGNOSIS — J9621 Acute and chronic respiratory failure with hypoxia: Secondary | ICD-10-CM | POA: Diagnosis present

## 2017-10-20 DIAGNOSIS — I5033 Acute on chronic diastolic (congestive) heart failure: Secondary | ICD-10-CM | POA: Diagnosis present

## 2017-10-20 DIAGNOSIS — Z794 Long term (current) use of insulin: Secondary | ICD-10-CM | POA: Diagnosis not present

## 2017-10-20 DIAGNOSIS — E785 Hyperlipidemia, unspecified: Secondary | ICD-10-CM | POA: Diagnosis present

## 2017-10-20 DIAGNOSIS — I130 Hypertensive heart and chronic kidney disease with heart failure and stage 1 through stage 4 chronic kidney disease, or unspecified chronic kidney disease: Secondary | ICD-10-CM | POA: Diagnosis present

## 2017-10-20 DIAGNOSIS — J811 Chronic pulmonary edema: Secondary | ICD-10-CM | POA: Diagnosis not present

## 2017-10-20 DIAGNOSIS — D649 Anemia, unspecified: Secondary | ICD-10-CM | POA: Diagnosis present

## 2017-10-20 DIAGNOSIS — F17210 Nicotine dependence, cigarettes, uncomplicated: Secondary | ICD-10-CM | POA: Diagnosis present

## 2017-10-20 DIAGNOSIS — R079 Chest pain, unspecified: Secondary | ICD-10-CM | POA: Diagnosis not present

## 2017-10-20 DIAGNOSIS — E7800 Pure hypercholesterolemia, unspecified: Secondary | ICD-10-CM | POA: Diagnosis not present

## 2017-10-20 DIAGNOSIS — R072 Precordial pain: Secondary | ICD-10-CM | POA: Diagnosis not present

## 2017-10-20 DIAGNOSIS — J810 Acute pulmonary edema: Secondary | ICD-10-CM | POA: Diagnosis not present

## 2017-10-20 DIAGNOSIS — E039 Hypothyroidism, unspecified: Secondary | ICD-10-CM | POA: Diagnosis present

## 2017-10-20 DIAGNOSIS — I10 Essential (primary) hypertension: Secondary | ICD-10-CM | POA: Diagnosis not present

## 2017-10-20 DIAGNOSIS — G4733 Obstructive sleep apnea (adult) (pediatric): Secondary | ICD-10-CM | POA: Diagnosis present

## 2017-10-20 DIAGNOSIS — R918 Other nonspecific abnormal finding of lung field: Secondary | ICD-10-CM | POA: Diagnosis present

## 2017-10-20 DIAGNOSIS — Z6841 Body Mass Index (BMI) 40.0 and over, adult: Secondary | ICD-10-CM | POA: Diagnosis not present

## 2017-10-20 DIAGNOSIS — E1122 Type 2 diabetes mellitus with diabetic chronic kidney disease: Secondary | ICD-10-CM | POA: Diagnosis present

## 2017-10-20 DIAGNOSIS — E11649 Type 2 diabetes mellitus with hypoglycemia without coma: Secondary | ICD-10-CM | POA: Diagnosis not present

## 2017-10-20 DIAGNOSIS — I319 Disease of pericardium, unspecified: Secondary | ICD-10-CM | POA: Diagnosis present

## 2017-10-20 DIAGNOSIS — R0602 Shortness of breath: Secondary | ICD-10-CM | POA: Diagnosis not present

## 2017-10-20 DIAGNOSIS — K219 Gastro-esophageal reflux disease without esophagitis: Secondary | ICD-10-CM | POA: Diagnosis present

## 2017-10-20 LAB — COMPREHENSIVE METABOLIC PANEL
ALT: 16 U/L (ref 14–54)
ANION GAP: 9 (ref 5–15)
AST: 25 U/L (ref 15–41)
Albumin: 2.6 g/dL — ABNORMAL LOW (ref 3.5–5.0)
Alkaline Phosphatase: 49 U/L (ref 38–126)
BUN: 11 mg/dL (ref 6–20)
CALCIUM: 8.4 mg/dL — AB (ref 8.9–10.3)
CHLORIDE: 103 mmol/L (ref 101–111)
CO2: 27 mmol/L (ref 22–32)
CREATININE: 1 mg/dL (ref 0.44–1.00)
GFR, EST NON AFRICAN AMERICAN: 57 mL/min — AB (ref >60)
Glucose, Bld: 135 mg/dL — ABNORMAL HIGH (ref 65–99)
Potassium: 4 mmol/L (ref 3.5–5.1)
Sodium: 139 mmol/L (ref 135–145)
Total Bilirubin: 0.6 mg/dL (ref 0.3–1.2)
Total Protein: 7.4 g/dL (ref 6.5–8.1)

## 2017-10-20 LAB — TROPONIN I
TROPONIN I: 0.03 ng/mL — AB (ref <0.03)
Troponin I: 0.03 ng/mL (ref <0.03)

## 2017-10-20 LAB — CBG MONITORING, ED
GLUCOSE-CAPILLARY: 194 mg/dL — AB (ref 65–99)
GLUCOSE-CAPILLARY: 85 mg/dL (ref 65–99)
GLUCOSE-CAPILLARY: 131 mg/dL — AB (ref 65–99)
GLUCOSE-CAPILLARY: 148 mg/dL — AB (ref 65–99)
Glucose-Capillary: 194 mg/dL — ABNORMAL HIGH (ref 65–99)
Glucose-Capillary: 217 mg/dL — ABNORMAL HIGH (ref 65–99)
Glucose-Capillary: 38 mg/dL — CL (ref 65–99)
Glucose-Capillary: 42 mg/dL — CL (ref 65–99)

## 2017-10-20 LAB — ECHOCARDIOGRAM COMPLETE
Height: 69 in
Weight: 4560 oz

## 2017-10-20 LAB — ANA W/REFLEX IF POSITIVE
Anti Nuclear Antibody(ANA): POSITIVE — AB
Centromere Ab Screen: <0.2 AI (ref 0.0–0.9)
Chromatin Ab SerPl-aCnc: 7.9 AI — ABNORMAL HIGH (ref 0.0–0.9)
DSDNA AB: 1 [IU]/mL (ref 0–9)
ENA SM AB SER-ACNC: 1.5 AI — AB (ref 0.0–0.9)
ENA SSA (RO) AB: 0.8 AI (ref 0.0–0.9)
ENA SSB (LA) Ab: <0.2 AI (ref 0.0–0.9)
Scleroderma SCL-70: <0.2 AI (ref 0.0–0.9)

## 2017-10-20 LAB — SEDIMENTATION RATE: SED RATE: 88 mm/h — AB (ref 0–22)

## 2017-10-20 LAB — CK: Total CK: 153 U/L (ref 24–173)

## 2017-10-20 LAB — HEMOGLOBIN A1C
HEMOGLOBIN A1C: 5.6 % (ref 4.8–5.6)
Mean Plasma Glucose: 114.02 mg/dL

## 2017-10-20 LAB — BRAIN NATRIURETIC PEPTIDE
B NATRIURETIC PEPTIDE 5: 253.5 pg/mL — AB (ref 0.0–100.0)
B Natriuretic Peptide: 135.4 pg/mL — ABNORMAL HIGH (ref 0.0–100.0)

## 2017-10-20 LAB — RPR: RPR Ser Ql: NONREACTIVE

## 2017-10-20 LAB — VITAMIN B12: Vitamin B-12: 313 pg/mL (ref 232–1245)

## 2017-10-20 LAB — PROCALCITONIN: Procalcitonin: <0.1 ng/mL

## 2017-10-20 LAB — HIV ANTIBODY (ROUTINE TESTING W REFLEX): HIV Screen 4th Generation wRfx: NONREACTIVE

## 2017-10-20 LAB — MAGNESIUM: MAGNESIUM: 1.9 mg/dL (ref 1.7–2.4)

## 2017-10-20 LAB — TSH: TSH: 7.175 u[IU]/mL — AB (ref 0.350–4.500)

## 2017-10-20 LAB — GLUCOSE, CAPILLARY
GLUCOSE-CAPILLARY: 213 mg/dL — AB (ref 65–99)
Glucose-Capillary: 230 mg/dL — ABNORMAL HIGH (ref 65–99)

## 2017-10-20 LAB — C-REACTIVE PROTEIN: CRP: 26.9 mg/L — AB (ref 0.0–4.9)

## 2017-10-20 MED ORDER — ALBUTEROL SULFATE (2.5 MG/3ML) 0.083% IN NEBU: 2.5000 mg | INHALATION_SOLUTION | RESPIRATORY_TRACT | Status: DC | PRN

## 2017-10-20 MED ORDER — ALBUTEROL SULFATE (2.5 MG/3ML) 0.083% IN NEBU
2.5000 mg | INHALATION_SOLUTION | Freq: Four times a day (QID) | RESPIRATORY_TRACT | Status: DC
  Administered 2017-10-20: 2.5 mg via RESPIRATORY_TRACT
  Filled 2017-10-20 (×2): qty 3

## 2017-10-20 MED ORDER — AZITHROMYCIN 500 MG IV SOLR
500.0000 mg | INTRAVENOUS | Status: DC
  Administered 2017-10-21: 500 mg via INTRAVENOUS
  Filled 2017-10-20: qty 500

## 2017-10-20 MED ORDER — ONDANSETRON HCL 4 MG/2ML IJ SOLN: 4.0000 mg | Freq: Four times a day (QID) | INTRAMUSCULAR | Status: DC | PRN

## 2017-10-20 MED ORDER — ROSUVASTATIN CALCIUM 40 MG PO TABS
40.0000 mg | ORAL_TABLET | Freq: Every day | ORAL | Status: DC
  Administered 2017-10-20 – 2017-10-24 (×5): 40 mg via ORAL
  Filled 2017-10-20 (×2): qty 1
  Filled 2017-10-20: qty 2
  Filled 2017-10-20 (×3): qty 1

## 2017-10-20 MED ORDER — LATANOPROST 0.005 % OP SOLN
1.0000 [drp] | Freq: Every day | OPHTHALMIC | Status: DC
  Administered 2017-10-20 – 2017-10-23 (×4): 1 [drp] via OPHTHALMIC
  Filled 2017-10-20: qty 2.5

## 2017-10-20 MED ORDER — POTASSIUM CHLORIDE CRYS ER 20 MEQ PO TBCR
20.0000 meq | EXTENDED_RELEASE_TABLET | Freq: Every day | ORAL | Status: DC
  Administered 2017-10-20 – 2017-10-22 (×3): 20 meq via ORAL
  Filled 2017-10-20 (×3): qty 1

## 2017-10-20 MED ORDER — FUROSEMIDE 10 MG/ML IJ SOLN
40.0000 mg | INTRAMUSCULAR | Status: AC
  Administered 2017-10-20: 40 mg via INTRAVENOUS
  Filled 2017-10-20: qty 4

## 2017-10-20 MED ORDER — AZITHROMYCIN 500 MG IV SOLR
500.0000 mg | Freq: Once | INTRAVENOUS | Status: AC
  Administered 2017-10-20: 500 mg via INTRAVENOUS
  Filled 2017-10-20: qty 500

## 2017-10-20 MED ORDER — IOPAMIDOL (ISOVUE-370) INJECTION 76%
INTRAVENOUS | Status: AC
  Filled 2017-10-20: qty 100

## 2017-10-20 MED ORDER — ORAL CARE MOUTH RINSE
15.0000 mL | Freq: Two times a day (BID) | OROMUCOSAL | Status: DC
  Administered 2017-10-20 – 2017-10-24 (×7): 15 mL via OROMUCOSAL

## 2017-10-20 MED ORDER — IOPAMIDOL (ISOVUE-370) INJECTION 76%
100.0000 mL | Freq: Once | INTRAVENOUS | Status: AC | PRN
  Administered 2017-10-20: 52 mL via INTRAVENOUS

## 2017-10-20 MED ORDER — LEVOTHYROXINE SODIUM 75 MCG PO TABS
75.0000 ug | ORAL_TABLET | Freq: Every day | ORAL | Status: DC
  Administered 2017-10-21 – 2017-10-24 (×4): 75 ug via ORAL
  Filled 2017-10-20 (×4): qty 1

## 2017-10-20 MED ORDER — DEXTROSE 50 % IV SOLN
INTRAVENOUS | Status: AC
  Administered 2017-10-20: 01:00:00
  Filled 2017-10-20: qty 50

## 2017-10-20 MED ORDER — ACETAMINOPHEN 650 MG RE SUPP: 650.0000 mg | Freq: Four times a day (QID) | RECTAL | Status: DC | PRN

## 2017-10-20 MED ORDER — LEVOTHYROXINE SODIUM 50 MCG PO TABS
50.0000 ug | ORAL_TABLET | Freq: Every day | ORAL | Status: DC
  Administered 2017-10-20: 50 ug via ORAL
  Filled 2017-10-20: qty 1

## 2017-10-20 MED ORDER — SODIUM CHLORIDE 0.9 % IV SOLN
1.0000 g | INTRAVENOUS | Status: DC
  Administered 2017-10-21: 1 g via INTRAVENOUS
  Filled 2017-10-20 (×2): qty 10

## 2017-10-20 MED ORDER — FUROSEMIDE 10 MG/ML IJ SOLN: 40.0000 mg | Freq: Two times a day (BID) | INTRAMUSCULAR | Status: DC

## 2017-10-20 MED ORDER — IRBESARTAN 300 MG PO TABS
150.0000 mg | ORAL_TABLET | Freq: Every day | ORAL | Status: DC
  Administered 2017-10-20 – 2017-10-24 (×5): 150 mg via ORAL
  Filled 2017-10-20 (×5): qty 1

## 2017-10-20 MED ORDER — MOMETASONE FURO-FORMOTEROL FUM 200-5 MCG/ACT IN AERO
2.0000 | INHALATION_SPRAY | Freq: Two times a day (BID) | RESPIRATORY_TRACT | Status: DC
  Administered 2017-10-20 – 2017-10-24 (×7): 2 via RESPIRATORY_TRACT
  Filled 2017-10-20 (×2): qty 8.8

## 2017-10-20 MED ORDER — ACETAMINOPHEN 325 MG PO TABS
650.0000 mg | ORAL_TABLET | Freq: Four times a day (QID) | ORAL | Status: DC | PRN
  Administered 2017-10-21: 650 mg via ORAL
  Filled 2017-10-20: qty 2

## 2017-10-20 MED ORDER — ONDANSETRON HCL 4 MG PO TABS: 4.0000 mg | ORAL_TABLET | Freq: Four times a day (QID) | ORAL | Status: DC | PRN

## 2017-10-20 MED ORDER — DILTIAZEM HCL ER COATED BEADS 180 MG PO CP24
360.0000 mg | ORAL_CAPSULE | Freq: Every day | ORAL | Status: DC
  Administered 2017-10-20 – 2017-10-24 (×5): 360 mg via ORAL
  Filled 2017-10-20 (×3): qty 2
  Filled 2017-10-20: qty 1
  Filled 2017-10-20 (×2): qty 2

## 2017-10-20 MED ORDER — SODIUM CHLORIDE 0.9 % IV SOLN
1.0000 g | Freq: Once | INTRAVENOUS | Status: AC
  Administered 2017-10-20: 1 g via INTRAVENOUS
  Filled 2017-10-20: qty 10

## 2017-10-20 NOTE — ED Notes (Signed)
resp into do tx breathing

## 2017-10-20 NOTE — H&P (Signed)
History and Physical    [name] WUJ:[id number] DOB: 04/10/1951 DOA: 10/19/2017  PCP: Colon Branch, MD  Patient coming from: Home.  Chief Complaint: Shortness of breath.   HPI: [name] is a 67 y.o. female with history of asthma, hypertension, hyperlipidemia,  sleep apnea, hypothyroidism, diabetes mellitus presents to the ER with complaints of shortness of breath.  Patient states she is chronically short of breath and uses home oxygen 3 L but last night became acutely short of breath denies any chest pain productive cough fever or chills.  Shortness of breath is present even at rest and increased on minimal exertion.  Has not noticed any lower extremity edema.  ED Course: In the ER patient was hypoxic but able to talk complete sentences.  Chest x-ray was showing nonspecific findings.  CT angiogram of the chest was done which shows features concerning for multifocal groundglass opacities differentials include pulmonary edema versus infectious or inflammatory etiology.  While in the ER patient was hypoglycemic.  Was given D50.  Lasix and antibiotics has been ordered admitted for further management of acute respiratory failure.  Review of Systems: As per HPI, rest all negative.   Past Medical History:  Diagnosis Date  . Anterolisthesis    Grade 1, L4-5  . Bronchospasm 05/28/2012  . CHEST PAIN 11/18/2007  . DEGENERATIVE JOINT DISEASE 10/06/2006  . DEPRESSION 09/26/2008  . DIABETES MELLITUS 10/06/2006  . Diverticulosis    [id number]  . GERD (gastroesophageal reflux disease) 07/25/2013  . HYPERLIPIDEMIA 01/11/2009  . HYPERTENSION 10/06/2006  . INSOMNIA 09/26/2008  . Internal hemorrhoids   . OBSTRUCTIVE SLEEP APNEA 06/23/2008   Severe OSA per sleep study 2010, Rx a CPAP  . Pain in joint, multiple sites 11/10/2006  . UNSPECIFIED ANEMIA 12/10/2009    Past Surgical History:  Procedure Laterality Date  . COLONOSCOPY  08/01/2011   Procedure: COLONOSCOPY;  Surgeon: Inda Castle, MD;   Location: [facility];  Service: Endoscopy;  Laterality: N/A;  . 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 Right knee replacement  2005     reports that she has been smoking cigarettes.  She has a 0.80 pack-year smoking history. She has never used smokeless tobacco. She reports that she drinks alcohol. She reports that she does not use drugs.  No Known Allergies  Family History  Problem Relation Age of Onset  . Asthma Mother   . Stroke Mother   . Diabetes Other        M, B, S  . Hypertension Sister        M, S,B  . Pancreatic cancer Brother   . Colon cancer Neg Hx   . Prostate cancer Neg Hx   . Breast cancer Neg Hx     Prior to Admission medications   Medication Sig Start Date End Date Taking? Authorizing Provider  ACCU-CHEK AVIVA PLUS test strip TEST BLOD SUGAR TWICE DAILY AND LANCETS TWICE DAILY 10/15/17  Yes Renato Shin, MD  ACCU-CHEK SOFTCLIX LANCETS lancets USE TO CHECK BLOOD SUGAR TWICE A DAY 10/05/17  Yes Renato Shin, MD  albuterol (PROVENTIL HFA;VENTOLIN HFA) 108 (90 BASE) MCG/ACT inhaler Inhale 2 puffs into the lungs every 4 (four) hours as needed for wheezing. 06/01/14 07/30/25 Yes Paz, Alda Berthold, MD  budesonide-formoterol [facility]) 160-4.5 MCG/ACT inhaler Inhale 2 puffs into the lungs 2 (two) times daily. Patient taking differently: Inhale 2 puffs into the lungs daily as needed (SOB).  09/01/14  Yes Colon Branch, MD  diltiazem [facility]  CD) 360 MG 24 hr capsule Take 1 capsule (360 mg total) by mouth daily. 09/30/17  Yes Paz, Alda Berthold, MD  Insulin Lispro Prot & Lispro (HUMALOG MIX 50/50 KWIKPEN) (50-50) 100 UNIT/ML Kwikpen Inject 60 Units into the skin 2 (two) times daily with a meal. And pen needles 2/day Patient taking differently: Inject 49-50 Units into the skin 2 (two) times daily with a meal. And pen needles 2/day 08/04/17  Yes Renato Shin, MD  latanoprost (XALATAN) 0.005 % ophthalmic solution USE 1 DROP IN BOTH EYES AT BEDTIME 12/09/16  Yes [provider]  levothyroxine  (SYNTHROID, LEVOTHROID) 50 MCG tablet Take 50 mcg by mouth daily before breakfast.   Yes [provider]  OXYGEN Inhale 3 L into the lungs continuous.    Yes [provider]  potassium chloride (K-DUR,KLOR-CON) 10 MEQ tablet TAKE 1 TABLET(10 MEQ) BY MOUTH DAILY 10/15/17  Yes Paz, Alda Berthold, MD  rosuvastatin (CRESTOR) 40 MG tablet Take 1 tablet (40 mg total) by mouth daily. 04/30/17  Yes Paz, Alda Berthold, MD  telmisartan (MICARDIS) 40 MG tablet Take 1 tablet (40 mg total) by mouth daily. 06/08/17  Yes Colon Branch, MD  aspirin EC 81 MG tablet Take 81 mg by mouth daily.    [provider]    Physical Exam: Vitals:   10/20/17 0500 10/20/17 0530 10/20/17 0600 10/20/17 0630  BP: (!) 146/108 (!) 161/96 (!) 145/98   Pulse: 98 95 95 95  Resp: (!) 28 (!) 24 19   Temp:      TempSrc:      SpO2: 96% 100% 95% 96%  Weight:      Height:          Constitutional: Moderately built and nourished. Vitals:   10/20/17 0500 10/20/17 0530 10/20/17 0600 10/20/17 0630  BP: (!) 146/108 (!) 161/96 (!) 145/98   Pulse: 98 95 95 95  Resp: (!) 28 (!) 24 19   Temp:      TempSrc:      SpO2: 96% 100% 95% 96%  Weight:      Height:       Eyes: Anicteric no pallor. ENMT: No discharge from the ears eyes nose or mouth. Neck: No JVD appreciated no mass felt. Respiratory: No rhonchi or crepitations. Cardiovascular: S1-S2 heard no murmurs appreciated. Abdomen: Soft nontender bowel sounds present. Musculoskeletal: No edema.  No joint effusion. Skin: No rash.  Skin appears warm. Neurologic: Alert awake oriented to time place and person.  Moves all extremities. Psychiatric: Appears normal.  Normal affect.   Labs on Admission: I have personally reviewed following labs and imaging studies  CBC: Recent Labs  Lab 10/19/17 2325  WBC 6.4  HGB 11.2*  HCT 35.7*  MCV 76.9*  PLT 371*   Basic Metabolic Panel: Recent Labs  Lab 10/19/17 2325  NA 139  K 3.1*  CL 105  CO2 24  GLUCOSE 39*  BUN  12  CREATININE 1.08*  CALCIUM 8.6*   GFR: Estimated Creatinine Clearance: 71.9 mL/min (A) (by C-G formula based on SCr of 1.08 mg/dL (H)). Liver Function Tests: No results for input(s): AST, ALT, ALKPHOS, BILITOT, PROT, ALBUMIN in the last 168 hours. No results for input(s): LIPASE, AMYLASE in the last 168 hours. No results for input(s): AMMONIA in the last 168 hours. Coagulation Profile: No results for input(s): INR, PROTIME in the last 168 hours. Cardiac Enzymes: No results for input(s): CKTOTAL, CKMB, CKMBINDEX, TROPONINI in the last 168 hours. BNP (last 3 results)  No results for input(s): PROBNP in the last 8760 hours. HbA1C: No results for input(s): HGBA1C in the last 72 hours. CBG: Recent Labs  Lab 10/20/17 0029 10/20/17 0103 10/20/17 0225 10/20/17 0330 10/20/17 0643  GLUCAP 38* 194* 217* 194* 85   Lipid Profile: No results for input(s): CHOL, HDL, LDLCALC, TRIG, CHOLHDL, LDLDIRECT in the last 72 hours. Thyroid Function Tests: No results for input(s): TSH, T4TOTAL, FREET4, T3FREE, THYROIDAB in the last 72 hours. Anemia Panel: No results for input(s): VITAMINB12, FOLATE, FERRITIN, TIBC, IRON, RETICCTPCT in the last 72 hours. Urine analysis:    Component Value Date/Time   COLORURINE YELLOW 11/30/2013 1057   APPEARANCEUR CLEAR 11/30/2013 1057   LABSPEC 1.010 11/30/2013 1057   PHURINE 5.5 11/30/2013 1057   GLUCOSEU NEGATIVE 11/30/2013 1057   HGBUR NEGATIVE 11/30/2013 1057   BILIRUBINUR NEGATIVE 11/30/2013 1057   KETONESUR NEGATIVE 11/30/2013 1057   PROTEINUR NEGATIVE 12/31/2011 1029   UROBILINOGEN 0.2 11/30/2013 1057   NITRITE NEGATIVE 11/30/2013 1057   LEUKOCYTESUR TRACE (A) 11/30/2013 1057   Sepsis Labs: @LABRCNTIP (procalcitonin:4,lacticidven:4) )No results found for this or any previous visit (from the past 240 hour(s)).   Radiological Exams on Admission: Dg Chest 2 View  Result Date: 10/20/2017 CLINICAL DATA:  Chest pain and shortness of breath EXAM:  CHEST - 2 VIEW COMPARISON:  Sep 11, 2017 FINDINGS: Cardiomegaly. Mild edema. Mild patchy opacity in the lateral left lung base is more prominent the interval. No other interval changes. IMPRESSION: 1. Cardiomegaly and mild edema. 2. Mild patchy opacity in the lateral left lung base. Recommend follow-up to resolution. Electronically Signed   By: Dorise Bullion III M.D   On: 10/20/2017 00:03   Ct Angio Chest Pe W And/or Wo Contrast  Result Date: 10/20/2017 CLINICAL DATA:  Shortness of breath.  Chest pain. EXAM: CT ANGIOGRAPHY CHEST WITH CONTRAST TECHNIQUE: Multidetector CT imaging of the chest was performed using the standard protocol during bolus administration of intravenous contrast. Multiplanar CT image reconstructions and MIPs were obtained to evaluate the vascular anatomy. CONTRAST:  27mL ISOVUE-370 IOPAMIDOL (ISOVUE-370) INJECTION 76% COMPARISON:  Chest radiograph yesterday. FINDINGS: Cardiovascular: There are no filling defects within the pulmonary arteries to suggest pulmonary embolus. Multi chamber cardiomegaly. Thoracic aorta is normal in caliber without dissection. Mild aortic atherosclerosis. Small to moderate pericardial effusion measures up to 2 cm in depth. This is slightly complex. There are coronary artery calcifications. Mediastinum/Nodes: Small mediastinal and hilar lymph nodes not enlarged by size criteria. Mild enlargement of the right thyroid gland without dominant nodule. The esophagus is decompressed. Small hiatal hernia. Lungs/Pleura: Ground-glass opacities in the dependent upper lobes, diffusely throughout the right and left lower lobe and to a lesser extent right middle lobe. Findings are most prominent in a dependent distribution. No septal thickening. No pleural effusion. Trachea and mainstem bronchi are patent. Upper Abdomen: No acute findings. Musculoskeletal: Multilevel degenerative change in the spine. There are no acute or suspicious osseous abnormalities. Sclerosis about  posterior right fourth rib at the chondro vertebral junction likely secondary to remote prior fracture. Review of the MIP images confirms the above findings. IMPRESSION: 1. No pulmonary embolus. 2. Multifocal ground-glass opacities, most prominent in the lung bases. Findings may represent pulmonary edema, infectious or inflammatory etiologies, or developing ARDS. 3. Cardiomegaly with small to moderate pericardial effusion. 4. Aortic Atherosclerosis (ICD10-I70.0). Coronary artery calcifications. Electronically Signed   By: Jeb Levering M.D.   On: 10/20/2017 05:38    EKG: Independently reviewed.  Sinus tachycardia.  Assessment/Plan Principal Problem:  Acute respiratory failure with hypoxia (HCC) Active Problems:   DM II (diabetes mellitus, type II), controlled ([place])   Obstructive sleep apnea   Essential hypertension   GERD (gastroesophageal reflux disease)   Morbid obesity (HCC)   Asthma, chronic    1. Acute respiratory failure with hypoxia -differentials include CHF versus infectious versus inflammatory disorder.  Symptoms are more likely secondary to CHF.  The patient has been placed on Lasix 40 mg IV every 12.  CAT scan does show moderate recurrent effusion for which I have ordered 2D echo.  No signs of any cardiac tamponade.  Since there is concern for possible infectious etiology patient is on empiric antibiotics we will check procalcitonin levels and if negative may discontinue antibiotics.  Continue nebulizer treatment for asthma but no active wheezing seen. 2. Hyperglycemia with history of diabetes mellitus type 2 on insulin -I have not ordered patient's insulin dose at this time.  Patient was given D50.  Will check CBG every 2 hourly and based on the response and metabolic panel hemoglobin A1c we will reconsider starting insulin at a lower rate. 3. Sleep apnea on CPAP. 4. Chronic kidney disease stage II -creatinine appears to be at baseline. 5. Chronic anemia -follow  CBC. 6. Hypertension on ARB and Cardizem. 7. Hyperlipidemia on Crestor.   DVT prophylaxis: SCDs until patient's 2D echo does not show any definite signs of tamponade. Code Status: Full code. Family Communication: Discussed with patient. Disposition Plan: Home. Consults called: None. Admission status: Inpatient.   Rise Patience MD [facility] Pager [phone].  If 7PM-7AM, please contact night-coverage [email or web address] Password [facility]  10/20/2017, 7:22 AM

## 2017-10-20 NOTE — ED Notes (Signed)
Patient transported to CT 

## 2017-10-20 NOTE — ED Notes (Signed)
Pt CBG 131. Notified Jenny Reichmann, Therapist, sports.

## 2017-10-20 NOTE — ED Notes (Signed)
Lab called with Critical Glucose 39. Pt taken to triage for re-eval and given OJ and crackers/PB.  Dr. Wyvonnia Dusky aware

## 2017-10-20 NOTE — Progress Notes (Signed)
[name] is a 67 year old female history of asthma, hypertension, hyperlipidemia, OSA, hypertension hypothyroidism, type 2 diabetes, chronic hypoxia with 3 L of O2 by nasal cannula continuously who presented to the ER with complaints of dyspnea.  Admits to intermittent chest pain.  CTA PE negative for pulmonary embolism however positive for pericardial effusion moderate size.  Cardiology paged for consult.    Please refer to H&P dictated by Dr. Hal Hope on 10/20/2017 for further details of the assessment and plan.

## 2017-10-20 NOTE — ED Notes (Signed)
Call to 3E to come get pt

## 2017-10-20 NOTE — ED Notes (Signed)
Pt returned from ECHO.

## 2017-10-20 NOTE — ED Notes (Signed)
Pt's CBG result was 148. Informed Dorian Pod - RN.

## 2017-10-20 NOTE — ED Notes (Signed)
ED Provider at bedside. 

## 2017-10-20 NOTE — ED Notes (Signed)
Patient transported to X-ray 

## 2017-10-20 NOTE — ED Notes (Signed)
Dr. Wyvonnia Dusky aware CBG still 38. IV started and 1amp D50 given.

## 2017-10-20 NOTE — ED Provider Notes (Signed)
[facility] EMERGENCY DEPARTMENT Provider Note   CSN: [id number] Arrival date & time: 10/19/17  2310     History   Chief Complaint Chief Complaint  Patient presents with  . Shortness of Breath    HPI [name] is a 67 y.o. female.  Patient presents to the emergency department with a chief complaint of shortness of breath.  She reports being diagnosed with pneumonia back in March.  States that she has been struggling to get better since then.  Reports that over the past day or so she has had significantly worsening shortness of breath.  She states that she normally wears oxygen at night, but lately has been needing to wear it during the day.  She reports that she has had productive cough, but denies any known fever or chills.  She is not taking any medication for her symptoms.  She states that she feels very fatigued and short of breath with any type of exertion including just standing.  The history is provided by the patient. No language interpreter was used.    Past Medical History:  Diagnosis Date  . Anterolisthesis    Grade 1, L4-5  . Bronchospasm 05/28/2012  . CHEST PAIN 11/18/2007  . DEGENERATIVE JOINT DISEASE 10/06/2006  . DEPRESSION 09/26/2008  . DIABETES MELLITUS 10/06/2006  . Diverticulosis    [id number]  . GERD (gastroesophageal reflux disease) 07/25/2013  . HYPERLIPIDEMIA 01/11/2009  . HYPERTENSION 10/06/2006  . INSOMNIA 09/26/2008  . Internal hemorrhoids   . OBSTRUCTIVE SLEEP APNEA 06/23/2008   Severe OSA per sleep study 2010, Rx a CPAP  . Pain in joint, multiple sites 11/10/2006  . UNSPECIFIED ANEMIA 12/10/2009    Patient Active Problem List   Diagnosis Date Noted  . Pain 10/19/2017  . Paresthesia 10/19/2017  . Weakness 10/19/2017  . Neck pain 10/19/2017  . PCP NOTES >>> 02/23/2015  . Nocturnal oxygen desaturation 01/02/2015  . Morbid obesity ([place]) 01/02/2015  . Asthma, chronic 01/02/2015  . GERD (gastroesophageal reflux disease)  07/25/2013  . DOE (dyspnea on exertion) 04/25/2013  . 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 Bronchospasm 05/28/2012  . Internal hemorrhoids without mention of complication 96/29/5284  . Annual physical exam 06/06/2011  . SKIN LESION 08/06/2009  . Hyperlipidemia 01/11/2009  . Depression 09/26/2008  . INSOMNIA 09/26/2008  . Obstructive sleep apnea 06/23/2008  . Chest pain 11/18/2007  . DM II (diabetes mellitus, type II), controlled ([place]) 10/06/2006  . Essential hypertension 10/06/2006  . Osteoarthritis 10/06/2006    Past Surgical History:  Procedure Laterality Date  . COLONOSCOPY  08/01/2011   Procedure: COLONOSCOPY;  Surgeon: Inda Castle, MD;  Location: [facility] ENDOSCOPY;  Service: Endoscopy;  Laterality: N/A;  . 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 Right knee replacement  2005     OB History   None      Home Medications    Prior to Admission medications   Medication Sig Start Date End Date Taking? Authorizing Provider  ACCU-CHEK AVIVA PLUS test strip TEST BLOD SUGAR TWICE DAILY AND LANCETS TWICE DAILY 10/15/17   Renato Shin, MD  ACCU-CHEK SOFTCLIX LANCETS lancets USE TO CHECK BLOOD SUGAR TWICE A DAY 10/05/17   Renato Shin, MD  albuterol (PROVENTIL HFA;VENTOLIN HFA) 108 (90 BASE) MCG/ACT inhaler Inhale 2 puffs into the lungs every 4 (four) hours as needed for wheezing. 06/01/14 07/30/25  Colon Branch, MD  aspirin EC 81 MG tablet Take 81 mg by mouth daily.  [provider]  budesonide-formoterol (SYMBICORT) 160-4.5 MCG/ACT inhaler Inhale 2 puffs into the lungs 2 (two) times daily. Patient taking differently: Inhale 2 puffs into the lungs as needed.  09/01/14   Colon Branch, MD  diltiazem (CARDIZEM CD) 360 MG 24 hr capsule Take 1 capsule (360 mg total) by mouth daily. 09/30/17   Colon Branch, MD  Insulin Lispro Prot & Lispro (HUMALOG MIX 50/50 KWIKPEN) (50-50) 100 UNIT/ML Kwikpen Inject 60 Units into the skin 2 (two) times daily with a  meal. And pen needles 2/day 08/04/17   Renato Shin, MD  latanoprost (XALATAN) 0.005 % ophthalmic solution USE 1 DROP IN BOTH EYES AT BEDTIME 12/09/16   [provider]  levothyroxine (SYNTHROID, LEVOTHROID) 50 MCG tablet Take 50 mcg by mouth daily before breakfast.    [provider]  OXYGEN Inhale 2 L into the lungs continuous.    [provider]  potassium chloride (K-DUR,KLOR-CON) 10 MEQ tablet TAKE 1 TABLET(10 MEQ) BY MOUTH DAILY 10/15/17   Colon Branch, MD  rosuvastatin (CRESTOR) 40 MG tablet Take 1 tablet (40 mg total) by mouth daily. 04/30/17   Colon Branch, MD  telmisartan (MICARDIS) 40 MG tablet Take 1 tablet (40 mg total) by mouth daily. 06/08/17   Colon Branch, MD    Family History Family History  Problem Relation Age of Onset  . Asthma Mother   . Stroke Mother   . Diabetes Other        M, B, S  . Hypertension Sister        M, S,B  . Pancreatic cancer Brother   . Colon cancer Neg Hx   . Prostate cancer Neg Hx   . Breast cancer Neg Hx     Social History Social History   Tobacco Use  . Smoking status: Current Some Day Smoker    Packs/day: 0.20    Years: 4.00    Pack years: 0.80    Types: Cigarettes    Last attempt to quit: 05/13/1995    Years since quitting: 22.4  . Smokeless tobacco: Never Used  Substance Use Topics  . Alcohol use: Yes    Comment: brandy once per wk  . Drug use: No     Allergies   Patient has no known allergies.   Review of Systems Review of Systems  All other systems reviewed and are negative.    Physical Exam Updated Vital Signs BP (!) 155/95   Pulse (!) 108   Temp 97.9 F (36.6 C) (Oral)   Resp (!) 25   Ht 5\' 9"  (1.753 m)   Wt 129.3 kg (285 lb)   SpO2 98%   BMI 42.09 kg/m   Physical Exam  Constitutional: She is oriented to person, place, and time. She appears well-developed and well-nourished.  HENT:  Head: Normocephalic and atraumatic.  Eyes: Pupils are equal, round, and reactive to light.  Conjunctivae and EOM are normal.  Neck: Normal range of motion. Neck supple.  Cardiovascular: Normal rate and regular rhythm. Exam reveals no gallop and no friction rub.  No murmur heard. Pulmonary/Chest: Effort normal. No respiratory distress. She has no wheezes. She has rales in the left lower field. She exhibits no tenderness.  Abdominal: Soft. Bowel sounds are normal. She exhibits no distension and no mass. There is no tenderness. There is no rebound and no guarding.  Musculoskeletal: Normal range of motion. She exhibits no edema or tenderness.  Neurological: She is alert and oriented to person,  place, and time.  Skin: Skin is warm and dry.  Psychiatric: She has a normal mood and affect. Her behavior is normal. Judgment and thought content normal.  Nursing note and vitals reviewed.    ED Treatments / Results  Labs (all labs ordered are listed, but only abnormal results are displayed) Labs Reviewed  BASIC METABOLIC PANEL - Abnormal; Notable for the following components:      Result Value   Potassium 3.1 (*)    Glucose, Bld 39 (*)    Creatinine, Ser 1.08 (*)    Calcium 8.6 (*)    GFR calc non Af Amer 52 (*)    GFR calc Af Amer 60 (*)    All other components within normal limits  CBC - Abnormal; Notable for the following components:   Hemoglobin 11.2 (*)    HCT 35.7 (*)    MCV 76.9 (*)    MCH 24.1 (*)    RDW 19.9 (*)    Platelets 457 (*)    All other components within normal limits  CBG MONITORING, ED - Abnormal; Notable for the following components:   Glucose-Capillary 42 (*)    All other components within normal limits  CBG MONITORING, ED - Abnormal; Notable for the following components:   Glucose-Capillary 38 (*)    All other components within normal limits  CBG MONITORING, ED - Abnormal; Notable for the following components:   Glucose-Capillary 194 (*)    All other components within normal limits  CBG MONITORING, ED - Abnormal; Notable for the following components:    Glucose-Capillary 217 (*)    All other components within normal limits  CBG MONITORING, ED - Abnormal; Notable for the following components:   Glucose-Capillary 194 (*)    All other components within normal limits  I-STAT TROPONIN, ED    EKG EKG Interpretation  Date/Time:  Monday October 19 2017 23:21:42 EDT Ventricular Rate:  102 PR Interval:  172 QRS Duration: 94 QT Interval:  398 QTC Calculation: 518 R Axis:   63 Text Interpretation:  Sinus tachycardia with Premature atrial complexes Cannot rule out Anterior infarct , age undetermined Abnormal ECG Rate faster Confirmed by Ezequiel Essex [id number]) on 10/20/2017 1:22:05 AM   Radiology Dg Chest 2 View  Result Date: 10/20/2017 CLINICAL DATA:  Chest pain and shortness of breath EXAM: CHEST - 2 VIEW COMPARISON:  Sep 11, 2017 FINDINGS: Cardiomegaly. Mild edema. Mild patchy opacity in the lateral left lung base is more prominent the interval. No other interval changes. IMPRESSION: 1. Cardiomegaly and mild edema. 2. Mild patchy opacity in the lateral left lung base. Recommend follow-up to resolution. Electronically Signed   By: Dorise Bullion III M.D   On: 10/20/2017 00:03    Procedures Procedures (including critical care time)  Medications Ordered in ED Medications  cefTRIAXone (ROCEPHIN) 1 g in sodium chloride 0.9 % 100 mL IVPB (has no administration in time range)  azithromycin (ZITHROMAX) 500 mg in sodium chloride 0.9 % 250 mL IVPB (has no administration in time range)  dextrose 50 % solution (  Given 10/20/17 0042)     Initial Impression / Assessment and Plan / ED Course  I have reviewed the triage vital signs and the nursing notes.  Pertinent labs & imaging results that were available during my care of the patient were reviewed by me and considered in my medical decision making (see chart for details).     Patient with shortness of breath.  Reports recent diagnosis of pneumonia back in  March, and has never really improved all  the way back to normal.  She states over the past 2 days she has had worsening symptoms of shortness of breath.  She is unable to ambulate or stand without becoming very short of breath.  She normally wears oxygen at night, but has been needing to wear it during the day.  She reports that she has had productive cough, but no fevers.  Chest x-ray shows left lower opacity.  She also has some rales in this field.  She is noted to be tachypneic and has mild increased work of breathing.  Additionally, patient was noted to be hypoglycemic to 38.  She denies any changes in her medications, and reports that she has been taking everything as directed.  She was given 1 amp of dextrose in triage, and blood sugar has stabilized.  Most recent CBG is 194.  Patient discussed with Dr. Wyvonnia Dusky, who agrees with plan for admission.  Patient seen by discussed with Dr. Wyvonnia Dusky, who agrees with plan for admission.  Recommends adding BNP and consider CT to rule out PE.    Appreciate Dr. Hal Hope for admitting the patient.  Also recommends CT to rule out PE.  CT negative for PE.  Final Clinical Impressions(s) / ED Diagnoses   Final diagnoses:  Shortness of breath    ED Discharge Orders    None       Montine Circle, PA-C 10/20/17 1103    Ezequiel Essex, MD 10/20/17 1620

## 2017-10-20 NOTE — ED Notes (Signed)
Pt sitting up in bed eating breakfast

## 2017-10-20 NOTE — Consult Note (Addendum)
Cardiology Consultation:   Patient ID: [name]; [id number]; Apr 07, 1951   Admit date: 10/19/2017 Date of Consult: 10/20/2017  Primary Care Provider: Colon Branch, MD Primary Cardiologist: Dr. Fransico Him, MD  Patient Profile:   [name] is a 67 y.o. female with a hx of DM2, hyperlipidemia, hypertension, obesity, sleep apnea on CPAP and hypothyroidism who is being seen today for the evaluation of pericardial effusion at the request of Dr. Hal Hope.  History of Present Illness:   [name] is a 67 year old female with a history stated above who presented to [facility] on 10/19/2017 with complaints of acute onset shortness of breath yesterday evening while watching television. Patient states she is chronically short of breath and uses home supplemental oxygen at [address] [place] however last night on 10/18/2017 she became acutely short of breath, a change from her baseline. She states that she has had associated anterior chest pain while lying flat and intermittent epigastric discomfort with exertion. Subjective information difficult to obtain from patient, as she is contradicting her symptoms with each question. She states that she has spoken to her PCP about her symptoms in the past and she was seen as a referral by our service last year for further evaluation.  At that time her chest pain was episodic and described as sharp located under her right breast however was sometimes referred to her back. She reported that sometimes is was worse with deep breathing and movement and had associated shortness of breath with pain at times.  Subsequently, she underwent a Lexiscan nuclear stress test on 09/10/2016 revealed no ST segment deviation, LVEF of 45% with normal perfusion, no ischemia or infarction noted with diffuse hypokinesis of the myocardium.  A follow up echo was performed  09/09/2016 which revealed LVEF of 55 to 60% and no wall motion abnormalities.  Today, she denies cough, fever or chills,  abnormal LE swelling, palpitations or syncope. She has a remote history of tobacco use and denies any family or personal history of CAD. She has known OSA and reports CPAP use. She states that she has ongoing, chronic vomiting along with meals with associated epigastric pain. She is not on OTC or prescribed GERD medications.   In the ED, she was found to be hypoxic at presentation. CXR revealed cardiomegaly, mild edema and mild patchy opacity in the left lateral lung base with recommendations for follow-up. Given her presenting symptoms, a chest CT angiogram was performed which revealed features concerning for multifocal groundglass opacities with differentials which include pulmonary edema versus infectious versus inflammatory etiology. Additionally, there was noted to be a small to moderate pericardial effusion measuring up to 2 cm in depth present on CTA.  She was hypoglycemic in the ED with responsive to 1 amp of D50.  Lasix and antibiotics were ordered she was admitted to internal medicine for further management of acute respiratory failure. Per RN report, she has diuresed 1L since admission.  Cardiology was consulted for further evaluation of pericardial effusion.   Past Medical History:  Diagnosis Date  . Anterolisthesis    Grade 1, L4-5  . Bronchospasm 05/28/2012  . CHEST PAIN 11/18/2007  . DEGENERATIVE JOINT DISEASE 10/06/2006  . DEPRESSION 09/26/2008  . DIABETES MELLITUS 10/06/2006  . Diverticulosis    [id number]  . GERD (gastroesophageal reflux disease) 07/25/2013  . HYPERLIPIDEMIA 01/11/2009  . HYPERTENSION 10/06/2006  . INSOMNIA 09/26/2008  . Internal hemorrhoids   . OBSTRUCTIVE SLEEP APNEA 06/23/2008   Severe OSA per sleep study  2010, Rx a CPAP  . Pain in joint, multiple sites 11/10/2006  . UNSPECIFIED ANEMIA 12/10/2009    Past Surgical History:  Procedure Laterality Date  . COLONOSCOPY  08/01/2011   Procedure: COLONOSCOPY;  Surgeon: Inda Castle, MD;  Location: [facility] ENDOSCOPY;  Service:  Endoscopy;  Laterality: N/A;  . 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 Right knee replacement  2005     Prior to Admission medications   Medication Sig Start Date End Date Taking? Authorizing Provider  ACCU-CHEK AVIVA PLUS test strip TEST BLOD SUGAR TWICE DAILY AND LANCETS TWICE DAILY 10/15/17  Yes Renato Shin, MD  ACCU-CHEK SOFTCLIX LANCETS lancets USE TO CHECK BLOOD SUGAR TWICE A DAY 10/05/17  Yes Renato Shin, MD  albuterol (PROVENTIL HFA;VENTOLIN HFA) 108 (90 BASE) MCG/ACT inhaler Inhale 2 puffs into the lungs every 4 (four) hours as needed for wheezing. 06/01/14 07/30/25 Yes Paz, Alda Berthold, MD  budesonide-formoterol [facility]) 160-4.5 MCG/ACT inhaler Inhale 2 puffs into the lungs 2 (two) times daily. Patient taking differently: Inhale 2 puffs into the lungs daily as needed (SOB).  09/01/14  Yes Paz, Alda Berthold, MD  diltiazem (CARDIZEM CD) 360 MG 24 hr capsule Take 1 capsule (360 mg total) by mouth daily. 09/30/17  Yes Paz, Alda Berthold, MD  Insulin Lispro Prot & Lispro (HUMALOG MIX 50/50 KWIKPEN) (50-50) 100 UNIT/ML Kwikpen Inject 60 Units into the skin 2 (two) times daily with a meal. And pen needles 2/day Patient taking differently: Inject 49-50 Units into the skin 2 (two) times daily with a meal. And pen needles 2/day 08/04/17  Yes Renato Shin, MD  latanoprost (XALATAN) 0.005 % ophthalmic solution USE 1 DROP IN BOTH EYES AT BEDTIME 12/09/16  Yes [provider]  levothyroxine (SYNTHROID, LEVOTHROID) 50 MCG tablet Take 50 mcg by mouth daily before breakfast.   Yes [provider]  OXYGEN Inhale 3 L into the lungs continuous.    Yes [provider]  potassium chloride (K-DUR,KLOR-CON) 10 MEQ tablet TAKE 1 TABLET(10 MEQ) BY MOUTH DAILY 10/15/17  Yes Paz, Alda Berthold, MD  rosuvastatin (CRESTOR) 40 MG tablet Take 1 tablet (40 mg total) by mouth daily. 04/30/17  Yes Paz, Alda Berthold, MD  telmisartan (MICARDIS) 40 MG tablet Take 1 tablet (40 mg total) by mouth daily. 06/08/17  Yes Colon Branch, MD    aspirin EC 81 MG tablet Take 81 mg by mouth daily.    [provider]   Inpatient Medications: Scheduled Meds: . albuterol  2.5 mg Nebulization Q6H  . diltiazem  360 mg Oral Daily  . irbesartan  150 mg Oral Daily  . latanoprost  1 drop Both Eyes QHS  . levothyroxine  50 mcg Oral QAC breakfast  . mometasone-formoterol  2 puff Inhalation BID  . potassium chloride  20 mEq Oral Daily  . rosuvastatin  40 mg Oral Daily   Continuous Infusions: . [START ON 10/21/2017] azithromycin    . [START ON 10/21/2017] cefTRIAXone (ROCEPHIN)  IV     PRN Meds: acetaminophen **OR** acetaminophen, albuterol, ondansetron **OR** ondansetron (ZOFRAN) IV  Allergies:   No Known Allergies  Social History:   Social History   Socioeconomic History  . Marital status: Widowed    Spouse name: Not on file  . Number of children: 4  . Years of education: Not on file  . Highest education level: Not on file  Occupational History  . Occupation: disability    Employer: UNEMPLOYED  Social Needs  . Financial resource strain: Not  on file  . Food insecurity:    Worry: Not on file    Inability: Not on file  . Transportation needs:    Medical: Not on file    Non-medical: Not on file  Tobacco Use  . Smoking status: Current Some Day Smoker    Packs/day: 0.20    Years: 4.00    Pack years: 0.80    Types: Cigarettes    Last attempt to quit: 05/13/1995    Years since quitting: 22.4  . Smokeless tobacco: Never Used  Substance and Sexual Activity  . Alcohol use: Yes    Comment: brandy once per wk  . Drug use: No  . Sexual activity: Yes  Lifestyle  . Physical activity:    Days per week: Not on file    Minutes per session: Not on file  . Stress: Not on file  Relationships  . Social connections:    Talks on phone: Not on file    Gets together: Not on file    Attends religious service: Not on file    Active member of club or organization: Not on file    Attends meetings of clubs or organizations: Not  on file    Relationship status: Not on file  . Intimate partner violence:    Fear of current or ex partner: Not on file    Emotionally abused: Not on file    Physically abused: Not on file    Forced sexual activity: Not on file  Other Topics Concern  . Not on file  Social History Narrative   Widow , lives by herself   Lost a son, 3 living    Lost husband    Family History:   Family History  Problem Relation Age of Onset  . Asthma Mother   . Stroke Mother   . Diabetes Other        M, B, S  . Hypertension Sister        M, S,B  . Pancreatic cancer Brother   . Colon cancer Neg Hx   . Prostate cancer Neg Hx   . Breast cancer Neg Hx    Family Status:  Family Status  Relation Name Status  . Mother  Deceased  . Other  (Not Specified)  . Sister  (Not Specified)  . Brother  (Not Specified)  . Father  Deceased  . MGM  Deceased  . MGF  Deceased  . PGM  Deceased  . PGF  Deceased  . Neg Hx  (Not Specified)    ROS:  Please see the history of present illness.  All other ROS reviewed and negative.     Physical Exam/Data:   Vitals:   10/20/17 1032 10/20/17 1045 10/20/17 1130 10/20/17 1230  BP:  (!) 155/91 140/85 (!) 150/96  Pulse:  99  99  Resp:  (!) 24 18 (!) 31  Temp: 98.1 F (36.7 C)     TempSrc: Oral     SpO2:  98%  98%  Weight:      Height:        Intake/Output Summary (Last 24 hours) at 10/20/2017 1408 Last data filed at 10/20/2017 0645 Gross per 24 hour  Intake 350 ml  Output 500 ml  Net -150 ml   Filed Weights   10/19/17 2318  Weight: 285 lb (129.3 kg)   Body mass index is 42.09 kg/m.   General: Morbidly obese,  NAD Skin: Warm, dry, intact  Head: Normocephalic, atraumatic,clear, moist mucus membranes. Neck:  Negative for carotid bruits. No JVD Lungs: Bilateral rales upper and lower. No wheezes. Breathing is unlabored. Cardiovascular: RRR with S1 S2. No murmurs. +pericardial rub Abdomen: Soft, epigastric tenderness, non-distended with normoactive  bowel sounds. No obvious abdominal masses. MSK: Strength and tone appear normal for age. 5/5 in all extremities Extremities: No edema. No clubbing or cyanosis. DP/PT pulses 2+ bilaterally Neuro: Alert and oriented. No focal deficits. No facial asymmetry. MAE spontaneously. Psych: Responds to questions appropriately with flat affect.    EKG:  The EKG was personally reviewed and demonstrates: 10/19/2017 ST HR 109 Telemetry:  Telemetry was personally reviewed and demonstrates:  ST HR 104  Relevant CV Studies:  ECHO: Echocardiogram 09/09/16: Study Conclusions  - Left ventricle: The cavity size was normal. Wall thickness was   increased in a pattern of moderate LVH. Systolic function was   normal. The estimated ejection fraction was in the range of 55%   to 60%. Wall motion was normal; there were no regional wall   motion abnormalities. Left ventricular diastolic function   parameters were normal. - Left atrium: The atrium was mildly dilated.  Echocardiogram 10/20/17: Study Conclusions  - Left ventricle: The cavity size was normal. There was mild   concentric hypertrophy. Systolic function was normal. The   estimated ejection fraction was in the range of 55% to 60%. Wall   motion was normal; there were no regional wall motion   abnormalities. Left ventricular diastolic function parameters   were normal. - Aortic valve: Trileaflet; mildly thickened, mildly calcified   leaflets. - Mitral valve: Calcified annulus. Mildly thickened leaflets .   There was trivial regurgitation. - Right ventricle: The cavity size was mildly dilated. Wall   thickness was normal. Systolic function was mildly reduced. - Right atrium: The atrium was normal in size. - Pulmonic valve: There was mild regurgitation. - Pulmonary arteries: Systolic pressure was at the upper limits of   normal. - Inferior vena cava: The vessel was dilated. The respirophasic   diameter changes were blunted (< 50%), consistent  with elevated   central venous pressure. - Pericardium, extracardiac: There was small pericardial effusion  CATH: None   Lexiscan Myoview Stress 09/10/16: No ST segment deviation, LVEF of 45% with normal perfusion, no ischemia or infarction noted with diffuse hypokinesis of the myocardium.  Additionally, an echocardiogram was performed 09/09/2016 which revealed LVEF of 55 to 60% and no wall motion abnormalities.  Laboratory Data:  Chemistry Recent Labs  Lab 10/19/17 2325 10/20/17 0847  NA 139 139  K 3.1* 4.0  CL 105 103  CO2 24 27  GLUCOSE 39* 135*  BUN 12 11  CREATININE 1.08* 1.00  CALCIUM 8.6* 8.4*  GFRNONAA 52* 57*  GFRAA 60* >60  ANIONGAP 10 9    Total Protein  Date Value Ref Range Status  10/20/2017 7.4 6.5 - 8.1 g/dL Final   Albumin  Date Value Ref Range Status  10/20/2017 2.6 (L) 3.5 - 5.0 g/dL Final   AST  Date Value Ref Range Status  10/20/2017 25 15 - 41 U/L Final   ALT  Date Value Ref Range Status  10/20/2017 16 14 - 54 U/L Final   Alkaline Phosphatase  Date Value Ref Range Status  10/20/2017 49 38 - 126 U/L Final   Total Bilirubin  Date Value Ref Range Status  10/20/2017 0.6 0.3 - 1.2 mg/dL Final   Hematology Recent Labs  Lab 10/19/17 2325  WBC 6.4  RBC 4.64  HGB 11.2*  HCT 35.7*  MCV 76.9*  MCH 24.1*  MCHC 31.4  RDW 19.9*  PLT 457*   Cardiac Enzymes Recent Labs  Lab 10/20/17 0847 10/20/17 1231  TROPONINI <0.03 0.03*    Recent Labs  Lab 10/19/17 2331  TROPIPOC 0.04    BNP Recent Labs  Lab 10/20/17 0419  BNP 253.5*    DDimer No results for input(s): DDIMER in the last 168 hours. TSH:  Lab Results  Component Value Date   TSH 7.175 (H) 10/20/2017   Lipids: Lab Results  Component Value Date   CHOL 173 09/23/2016   HDL 50.90 09/23/2016   LDLCALC 97 09/23/2016   TRIG 126.0 09/23/2016   CHOLHDL 3 09/23/2016   HgbA1c: Lab Results  Component Value Date   HGBA1C 5.6 10/20/2017    Radiology/Studies:  Dg Chest 2  View  Result Date: 10/20/2017 CLINICAL DATA:  Chest pain and shortness of breath EXAM: CHEST - 2 VIEW COMPARISON:  Sep 11, 2017 FINDINGS: Cardiomegaly. Mild edema. Mild patchy opacity in the lateral left lung base is more prominent the interval. No other interval changes. IMPRESSION: 1. Cardiomegaly and mild edema. 2. Mild patchy opacity in the lateral left lung base. Recommend follow-up to resolution. Electronically Signed   By: Dorise Bullion III M.D   On: 10/20/2017 00:03   Ct Angio Chest Pe W And/or Wo Contrast  Result Date: 10/20/2017 CLINICAL DATA:  Shortness of breath.  Chest pain. EXAM: CT ANGIOGRAPHY CHEST WITH CONTRAST TECHNIQUE: Multidetector CT imaging of the chest was performed using the standard protocol during bolus administration of intravenous contrast. Multiplanar CT image reconstructions and MIPs were obtained to evaluate the vascular anatomy. CONTRAST:  39m ISOVUE-370 IOPAMIDOL (ISOVUE-370) INJECTION 76% COMPARISON:  Chest radiograph yesterday. FINDINGS: Cardiovascular: There are no filling defects within the pulmonary arteries to suggest pulmonary embolus. Multi chamber cardiomegaly. Thoracic aorta is normal in caliber without dissection. Mild aortic atherosclerosis. Small to moderate pericardial effusion measures up to 2 cm in depth. This is slightly complex. There are coronary artery calcifications. Mediastinum/Nodes: Small mediastinal and hilar lymph nodes not enlarged by size criteria. Mild enlargement of the right thyroid gland without dominant nodule. The esophagus is decompressed. Small hiatal hernia. Lungs/Pleura: Ground-glass opacities in the dependent upper lobes, diffusely throughout the right and left lower lobe and to a lesser extent right middle lobe. Findings are most prominent in a dependent distribution. No septal thickening. No pleural effusion. Trachea and mainstem bronchi are patent. Upper Abdomen: No acute findings. Musculoskeletal: Multilevel degenerative change in  the spine. There are no acute or suspicious osseous abnormalities. Sclerosis about posterior right fourth rib at the chondro vertebral junction likely secondary to remote prior fracture. Review of the MIP images confirms the above findings. IMPRESSION: 1. No pulmonary embolus. 2. Multifocal ground-glass opacities, most prominent in the lung bases. Findings may represent pulmonary edema, infectious or inflammatory etiologies, or developing ARDS. 3. Cardiomegaly with small to moderate pericardial effusion. 4. Aortic Atherosclerosis (ICD10-I70.0). Coronary artery calcifications. Electronically Signed   By: MJeb LeveringM.D.   On: 10/20/2017 05:38   Assessment and Plan:   1.  Pericardial effusion   I have reviewed echocardiogram   Pericardial effusion is present   Small I do not think it is of hemodynamically significance.    Lab eval signif for elevated TSH   Thyroid abnorm may be etiology.  May also reflect diastolic dysfunction with fluid retention  Would check ANA, ESR.although I am not convinced of an inflammatory etiology   2  CP  Rare  With lying back      3  Dyspnea/hypoxia    This is patients biggest complaint   Talking to her it has been progressive over past 4 months.   CTA was neg for PE   Did have groundglass appearance   ? PE vs infection vs inflammatory    Check ESR Pt has already received lasix IV as well as ABX    Follow wts, I/Os     3. Epigastric pain with vomiting: -Pt reports that she has a chronic issue with vomiting with meals and associated epigastric pain for several years.  -She has not taken any medications or reported this (per pt) in the past -Consider adding Protonix and possible GI consult if indicated  -No s/s of bleeding per report   4.  DM2: -SSI on hold secondary to hypoglycemia on admission. -Per primary team -Hemoglobin A1c, stable, 5.6  5.  Sleep apnea on CPAP: -Reports compliance  6.  Chronic kidney disease stage II: -Creatinine,  1.00 -Appears to be a patient's baseline   7.  HTN: -Fair control, 133/82, 150/96, 140/85 -Irbesartan 150, diltiazem 360  8.  HLD: -CHO-173, HDL-50, LDL- 97,Trig-126 (2018) -Continue Crestor 40  9.  Hypothyroidism: -TSH, 7.175 -Synthroid 50 mcg -Will increase to 3mg and will need recheck in 6 weeks per PCP  For questions or updates, please contact [place] ViewPlease consult [email or web address] for contact info under Cardiology/STEMI.   Signed, JKathyrn DrownNP-C [facility] Pager: [phone]/03/2018 2:08 PM  Ptatient seen and examined   I have amended note above by JTonny Branchto reflect my findings ON exam, pt is comfortable in bed Lungs are now rel clear   Cardiac exam:   RRR   No rub  No murmurs    Abd is benign   Ext with tr edema  Pericardial effusion is small, not of hemodyn significance Reviewed with KLiane ComberCheck thryroid  Diurese  Dyspnea:   Agree with diuresis   Follow exam, I/O and wt   May represent diastolc dysfunction Follow sats with walking tomorrow.    CP is atypical for cardiac   Rare.    PDorris Carnes

## 2017-10-20 NOTE — ED Notes (Signed)
Pt received lunch tray 

## 2017-10-20 NOTE — Progress Notes (Signed)
Inpatient Diabetes Program Recommendations  AACE/ADA: New Consensus Statement on Inpatient Glycemic Control (2015)  Target Ranges:  Prepandial:   less than 140 mg/dL      Peak postprandial:   less than 180 mg/dL (1-2 hours)      Critically ill patients:  140 - 180 mg/dL   Results for WALLACE, COGLIANO (MRN [id number]) as of 10/20/2017 11:29  Ref. Range 10/19/2017 23:59 10/20/2017 00:29 10/20/2017 01:03 10/20/2017 02:25 10/20/2017 03:30 10/20/2017 06:43 10/20/2017 08:28  Glucose-Capillary Latest Ref Range: 65 - 99 mg/dL 42 (LL) 38 (LL) 194 (H) 217 (H) 194 (H) 85 131 (H)   Review of Glycemic Control  Diabetes history: DM2 Outpatient Diabetes medications: Humalog 50/50 45-48 units BID Current orders for Inpatient glycemic control: CBG monitoring Q2H  Inpatient Diabetes Program Recommendations:  Outpatient Insulin Regimen: Recommend decreasing outpatient Humalog 50/50 at time of discharge. MD may want to consider decreasing to Humalog 50/50 25 units BID (which will provide about 25 units for basal and about 25 units for meal coverage per day) and have patient follow up with Endocrinologist.  Note: Spoke with patient about diabetes and home regimen for diabetes control. Patient reports that she is followed by Dr. Loanne Drilling for diabetes management and she last seen Dr. Loanne Drilling on 08/04/2017. Patient is currently takes Humalog 50/50 45-48 units BID as an outpatient for diabetes control. Patient reports that she is NOT taking insulin as prescribed because when she did she felt sick and her glucose was staying very low so she adjusted the dose herself.  Per office visit note by Dr. Loanne Drilling on 08/04/17 patient is prescribed Humalog 50/50 65 units BID. Patient states that she has informed Dr. Loanne Drilling at the prior office visits that she was having frequent hypoglycemia.  Patient states that she consistently takes the Humalog 50/50 BID but she is taking reduced dose. Even with taking reduced dose of Humalog 50/50, she  is having recurrent hypoglycemia every day with glucose values down in the 30-40's mg/dl. Patient states that she keeps glucose tablets, glucose gel, and other sugar sources close by at all times due to excessive hypoglycemia she has been experiencing for over 3 months. Discussed dangers of hypoglycemia and explained that if she is having recurrent hypoglycemia every day she likely needs to be taking less insulin. Patient states that she last took Humalog 50/50 48 units on the morning of 10/19/17. Patient states that she is having to eat more food than usual as well to keep glucose up. Informed patient that recommendation would be made to reduce dose of Humalog 50/50 but it would be up to the attending doctor to make changes or note. Encouraged patient to check her glucose 4 times per day (before meals and at bedtime) and to keep a log book of glucose readings and exact dose of insulin taken which she will need to take to doctor appointments. Patient verbalized understanding of information discussed and she states that she has no further questions at this time related to diabetes.  Thanks, Barnie Alderman, RN, MSN, CDE Diabetes Coordinator Inpatient Diabetes Program [phone] (Team Pager)

## 2017-10-20 NOTE — ED Notes (Signed)
Call to 3E 

## 2017-10-20 NOTE — ED Notes (Signed)
No answer in [place] by NT

## 2017-10-20 NOTE — ED Triage Notes (Signed)
Pt is in A3

## 2017-10-20 NOTE — ED Notes (Signed)
Pt c/o "burning" and discomfort to L forearm IV site. Reports discomfort began after initial abx infustion, but prior to 2nd abx infusion. IV flushed with no signs of infiltration or phlebitis. Switched infusion to other arm.

## 2017-10-20 NOTE — ED Notes (Addendum)
Date and time results received: 10/20/17 1:46 PM    Test: Troponin Critical Value: 0.03  Name of Provider Notified: Glenford Peers PA notified of Trop at 14:38

## 2017-10-20 NOTE — ED Notes (Signed)
Report was given to 3E, pt is still in echo. Will call them to pick her up once she is back

## 2017-10-21 ENCOUNTER — Telehealth: Payer: Self-pay | Admitting: Neurology

## 2017-10-21 ENCOUNTER — Other Ambulatory Visit: Payer: Self-pay | Admitting: *Deleted

## 2017-10-21 DIAGNOSIS — R079 Chest pain, unspecified: Secondary | ICD-10-CM

## 2017-10-21 DIAGNOSIS — K219 Gastro-esophageal reflux disease without esophagitis: Secondary | ICD-10-CM

## 2017-10-21 DIAGNOSIS — R0602 Shortness of breath: Secondary | ICD-10-CM

## 2017-10-21 DIAGNOSIS — J45909 Unspecified asthma, uncomplicated: Secondary | ICD-10-CM

## 2017-10-21 DIAGNOSIS — J9601 Acute respiratory failure with hypoxia: Secondary | ICD-10-CM

## 2017-10-21 DIAGNOSIS — E7800 Pure hypercholesterolemia, unspecified: Secondary | ICD-10-CM

## 2017-10-21 DIAGNOSIS — G4733 Obstructive sleep apnea (adult) (pediatric): Secondary | ICD-10-CM

## 2017-10-21 DIAGNOSIS — J810 Acute pulmonary edema: Secondary | ICD-10-CM

## 2017-10-21 DIAGNOSIS — I3139 Other pericardial effusion (noninflammatory): Secondary | ICD-10-CM

## 2017-10-21 DIAGNOSIS — I313 Pericardial effusion (noninflammatory): Secondary | ICD-10-CM

## 2017-10-21 DIAGNOSIS — Z794 Long term (current) use of insulin: Secondary | ICD-10-CM

## 2017-10-21 DIAGNOSIS — E119 Type 2 diabetes mellitus without complications: Secondary | ICD-10-CM

## 2017-10-21 DIAGNOSIS — I10 Essential (primary) hypertension: Secondary | ICD-10-CM

## 2017-10-21 LAB — BASIC METABOLIC PANEL
ANION GAP: 7 (ref 5–15)
BUN: 13 mg/dL (ref 6–20)
CHLORIDE: 105 mmol/L (ref 101–111)
CO2: 27 mmol/L (ref 22–32)
Calcium: 8.4 mg/dL — ABNORMAL LOW (ref 8.9–10.3)
Creatinine, Ser: 1.03 mg/dL — ABNORMAL HIGH (ref 0.44–1.00)
GFR calc non Af Amer: 55 mL/min — ABNORMAL LOW (ref >60)
Glucose, Bld: 160 mg/dL — ABNORMAL HIGH (ref 65–99)
POTASSIUM: 4.2 mmol/L (ref 3.5–5.1)
Sodium: 139 mmol/L (ref 135–145)

## 2017-10-21 LAB — GLUCOSE, CAPILLARY
GLUCOSE-CAPILLARY: 194 mg/dL — AB (ref 65–99)
GLUCOSE-CAPILLARY: 256 mg/dL — AB (ref 65–99)
GLUCOSE-CAPILLARY: 213 mg/dL — AB (ref 65–99)
Glucose-Capillary: 157 mg/dL — ABNORMAL HIGH (ref 65–99)

## 2017-10-21 LAB — CBC
HEMATOCRIT: 35 % — AB (ref 36.0–46.0)
HEMOGLOBIN: 10.8 g/dL — AB (ref 12.0–15.0)
MCH: 23.9 pg — ABNORMAL LOW (ref 26.0–34.0)
MCHC: 30.9 g/dL (ref 30.0–36.0)
MCV: 77.4 fL — ABNORMAL LOW (ref 78.0–100.0)
Platelets: 442 10*3/uL — ABNORMAL HIGH (ref 150–400)
RBC: 4.52 MIL/uL (ref 3.87–5.11)
RDW: 20.1 % — ABNORMAL HIGH (ref 11.5–15.5)
WBC: 5.1 10*3/uL (ref 4.0–10.5)

## 2017-10-21 MED ORDER — INSULIN ASPART 100 UNIT/ML ~~LOC~~ SOLN
0.0000 [IU] | Freq: Every day | SUBCUTANEOUS | Status: DC
  Administered 2017-10-21: 2 [IU] via SUBCUTANEOUS

## 2017-10-21 MED ORDER — METOPROLOL TARTRATE 12.5 MG HALF TABLET
12.5000 mg | ORAL_TABLET | Freq: Two times a day (BID) | ORAL | Status: DC
  Administered 2017-10-21 – 2017-10-22 (×3): 12.5 mg via ORAL
  Filled 2017-10-21 (×3): qty 1

## 2017-10-21 MED ORDER — METOPROLOL TARTRATE 50 MG PO TABS
50.0000 mg | ORAL_TABLET | Freq: Once | ORAL | Status: AC
  Administered 2017-10-22: 50 mg via ORAL
  Filled 2017-10-21: qty 1

## 2017-10-21 MED ORDER — INSULIN ASPART 100 UNIT/ML ~~LOC~~ SOLN
0.0000 [IU] | Freq: Three times a day (TID) | SUBCUTANEOUS | Status: DC
  Administered 2017-10-21: 8 [IU] via SUBCUTANEOUS
  Administered 2017-10-22 (×3): 2 [IU] via SUBCUTANEOUS
  Administered 2017-10-23: 3 [IU] via SUBCUTANEOUS
  Administered 2017-10-23: 2 [IU] via SUBCUTANEOUS
  Administered 2017-10-23: 3 [IU] via SUBCUTANEOUS
  Administered 2017-10-24: 2 [IU] via SUBCUTANEOUS

## 2017-10-21 MED ORDER — AZITHROMYCIN 500 MG PO TABS: 500.0000 mg | ORAL_TABLET | ORAL | Status: DC

## 2017-10-21 MED ORDER — COLCHICINE 0.6 MG PO TABS
0.6000 mg | ORAL_TABLET | Freq: Two times a day (BID) | ORAL | Status: DC
  Administered 2017-10-21 – 2017-10-24 (×7): 0.6 mg via ORAL
  Filled 2017-10-21 (×7): qty 1

## 2017-10-21 MED ORDER — FUROSEMIDE 10 MG/ML IJ SOLN
40.0000 mg | Freq: Once | INTRAMUSCULAR | Status: AC
  Administered 2017-10-21: 40 mg via INTRAVENOUS
  Filled 2017-10-21: qty 4

## 2017-10-21 MED ORDER — PANTOPRAZOLE SODIUM 40 MG PO TBEC
40.0000 mg | DELAYED_RELEASE_TABLET | Freq: Every day | ORAL | Status: DC
  Administered 2017-10-22 – 2017-10-24 (×3): 40 mg via ORAL
  Filled 2017-10-21 (×3): qty 1

## 2017-10-21 MED ORDER — IBUPROFEN 600 MG PO TABS
600.0000 mg | ORAL_TABLET | Freq: Three times a day (TID) | ORAL | Status: DC
  Administered 2017-10-21 – 2017-10-24 (×9): 600 mg via ORAL
  Filled 2017-10-21 (×9): qty 1

## 2017-10-21 NOTE — Progress Notes (Signed)
PROGRESS NOTE  [name]  HRC:[id number] DOB: 09/07/1950 DOA: 10/19/2017 PCP: Colon Branch, MD   Brief Narrative: [name] is a a.ge female with a history of obesity, OSA, asthma, chronic hypoxic respiratory failure, HTN, HLD, IDDM and hypothyroidism who presented to the ED with acute worsening of shortness of breath at rest. She was hypoxic with nonspecific CXR findings. CTA chest showed no PE but demonstrated diffuse dependent GGOs and echocardiogram revealed a pericardial effusion without features of tamponade. Cardiology was consulted and the patient was admitted, started on diuresis.   Assessment & Plan: Principal Problem:   Acute respiratory failure with hypoxia (HCC) Active Problems:   DM II (diabetes mellitus, type II), controlled ([place])   Hyperlipidemia   Obstructive sleep apnea   Essential hypertension   GERD (gastroesophageal reflux disease)   Morbid obesity (HCC)   Asthma, chronic   Shortness of breath   Pericardial effusion   Acute pulmonary edema (HCC)  Acute on chronic hypoxic respiratory failure: With multifocal nonspecific GGOs, mild pericardial effusion. BNP 135 (falsely low due to obesity?), troponin not elevated. Preserved EF and diastolic function on echo, mildly reduced RV function with dilated IVC. - Repeat ANA for quantitative data. Had recent neurology work up including ANA which was reportedly positive. ESR grossly elevated to 88 here, so suspect an inflammatory process involved.  - Continue diuresis - Doubt infectious etiology with undetectable procalcitonin, continue off abx.  - Pt of Dr. Juanetta Gosling presenting with broad DDx, appreciate pulmonology involvement.   Acute on chronic diastolic CHF:  - Diuresis  IDT2DM: Held insulin due to hypoglycemia, now CBGs rising. On humalog 50/50 45-48u BID but wasn't taking it like this due to symptomatic hypoglycemia. HbA1c 5.6% indicating too tight of control. - SSI AC/HS, will need more but will be NPO  after MN. Plan to tailor therapy going forward with anticipated DC on reduced dose humalog BID.  Hypothyroidism: Pt reports somewhat recently increasing synthroid dose. TSH 7.175.  - Synthroid dose increased back to outpatient dose of 82mg. - Will need PCP follow up and recheck in 4-6 weeks.  Asthma: No evidence of exacerbation - Continue dulera (for symbicort), prn nebs  Pericardial effusion: With chest pain worse when leaning forward and laying supine. Tn's 0.03.  - Cardiology started ibuprofen, colchicine.  - CTA w/FFR ordered per cardiology for ischemic r/o.  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  - Monitor  Chronic anemia:  - Monitor  DVT prophylaxis: SCDs Code Status: Full Family Communication: None at bedside Disposition Plan: Uncertain  Consultants:   Cardiology  Pulmonology  Procedures:   None  Antimicrobials:  Azithromycin, CTX 6/10-6/12.   Subjective: Chest pain as described above is stable. Dyspnea slightly better but not much change since admission, this has actually been several months. Pt has alternative and at times conflicting answers to similar questions throughout interview. Has not been taking oxygen around with her very regularly.  Objective: Vitals:   10/21/17 0615 10/21/17 0837 10/21/17 1325 10/21/17 1347  BP: (!) 150/86 (!) 165/99 (!) 151/97 (!) 156/85  Pulse: (!) 101 97 97 (!) 102  Resp: 18  20   Temp: 98 F (36.7 C)  98.2 F (36.8 C)   TempSrc: Oral  Oral   SpO2: 98% 99% 97% 100%  Weight: 129.4 kg (285 lb 4.4 oz)  Height:        Intake/Output Summary (Last 24 hours) at 10/21/2017 1830 Last data filed at 10/21/2017 1500 Gross per 24 hour  Intake 700 ml  Output 2400 ml  Net -1700 ml   Filed Weights   10/19/17 2318 10/20/17 1540 10/21/17 0615  Weight: 129.3 kg (285 lb) 129 kg (284 lb 6.3 oz) 129.4 kg (285 lb  4.4 oz)    Gen: Calm, obese female in no distress Pulm: Non-labored breathing 3L O2. Bibasilar crackles, diminished CV: Regular rate and rhythm. No murmur, rub, or gallop. No JVD, trace pedal edema. GI: Abdomen soft, non-tender, non-distended, with normoactive bowel sounds. No organomegaly or masses felt. Ext: Warm, no deformities Skin: No rashes, lesions or ulcers Neuro: Alert and oriented. No focal neurological deficits. Psych: Judgement and insight appear normal. Mood & affect appropriate.   Data Reviewed: I have personally reviewed following labs and imaging studies  CBC: Recent Labs  Lab 10/19/17 2325 10/21/17 0516  WBC 6.4 5.1  HGB 11.2* 10.8*  HCT 35.7* 35.0*  MCV 76.9* 77.4*  PLT 457* 335*   Basic Metabolic Panel: Recent Labs  Lab 10/19/17 2325 10/20/17 0847 10/21/17 0516  NA 139 139 139  K 3.1* 4.0 4.2  CL 105 103 105  CO2 _0 GLUCOSE 39* 135* 160*  BUN _1 CREATININE 1.08* 1.00 1.03*  CALCIUM 8.6* 8.4* 8.4*  MG  --  1.9  --    GFR: Estimated Creatinine Clearance: 75.5 mL/min (A) (by C-G formula based on SCr of 1.03 mg/dL (H)). Liver Function Tests: Recent Labs  Lab 10/20/17 0847  AST 25  ALT 16  ALKPHOS 49  BILITOT 0.6  PROT 7.4  ALBUMIN 2.6*   No results for input(s): LIPASE, AMYLASE in the last 168 hours. No results for input(s): AMMONIA in the last 168 hours. Coagulation Profile: No results for input(s): INR, PROTIME in the last 168 hours. Cardiac Enzymes: Recent Labs  Lab 10/19/17 1426 10/20/17 0847 10/20/17 1231 10/20/17 1848  CKTOTAL 153  --   --   --   TROPONINI  --  <0.03 0.03* 0.03*   BNP (last 3 results) No results for input(s): PROBNP in the last 8760 hours. HbA1C: Recent Labs    10/20/17 0847  HGBA1C 5.6   CBG: Recent Labs  Lab 10/20/17 1626 10/20/17 2107 10/21/17 0733 10/21/17 1210 10/21/17 1708  GLUCAP 213* 230* 157* 194* 256*   Lipid Profile: No results for input(s): CHOL, HDL, LDLCALC, TRIG,  CHOLHDL, LDLDIRECT in the last 72 hours. Thyroid Function Tests: Recent Labs    10/20/17 0847  TSH 7.175*   Anemia Panel: Recent Labs    10/19/17 1426  VITAMINB12 313   Urine analysis:    Component Value Date/Time   COLORURINE YELLOW 11/30/2013 1057   APPEARANCEUR CLEAR 11/30/2013 1057   LABSPEC 1.010 11/30/2013 1057   PHURINE 5.5 11/30/2013 1057   GLUCOSEU NEGATIVE 11/30/2013 1057   HGBUR NEGATIVE 11/30/2013 1057   BILIRUBINUR NEGATIVE 11/30/2013 1057   KETONESUR NEGATIVE 11/30/2013 1057   PROTEINUR NEGATIVE 12/31/2011 1029   UROBILINOGEN 0.2 11/30/2013 1057   NITRITE NEGATIVE 11/30/2013 1057   LEUKOCYTESUR TRACE (A) 11/30/2013 1057   Recent Results (from the past 240 hour(s))  Blood culture (routine x 2)     Status: None (Preliminary result)   Collection Time: 10/20/17  4:06 AM  Result Value Ref Range Status   Specimen Description BLOOD LEFT FOREARM  Final   Special Requests  Final    BOTTLES DRAWN AEROBIC AND ANAEROBIC Blood Culture adequate volume   Culture   Final    NO GROWTH 1 DAY Performed at [facility], [place] [address], [place], [place] [address]    Report Status PENDING  Incomplete  Blood culture (routine x 2)     Status: None (Preliminary result)   Collection Time: 10/20/17  4:19 AM  Result Value Ref Range Status   Specimen Description BLOOD RIGHT ANTECUBITAL  Final   Special Requests   Final    BOTTLES DRAWN AEROBIC AND ANAEROBIC Blood Culture adequate volume   Culture   Final    NO GROWTH 1 DAY Performed at [facility], [place] [address] [place], [place] [address]    Report Status PENDING  Incomplete      Radiology Studies: Dg Chest 2 View  Result Date: 10/20/2017 CLINICAL DATA:  Chest pain and shortness of breath EXAM: CHEST - 2 VIEW COMPARISON:  Sep 11, 2017 FINDINGS: Cardiomegaly. Mild edema. Mild patchy opacity in the lateral left lung base is more prominent the interval. No other interval changes. IMPRESSION: 1. Cardiomegaly and  mild edema. 2. Mild patchy opacity in the lateral left lung base. Recommend follow-up to resolution. Electronically Signed   By: Dorise Bullion III M.D   On: 10/20/2017 00:03   Ct Angio Chest Pe W And/or Wo Contrast  Result Date: 10/20/2017 CLINICAL DATA:  Shortness of breath.  Chest pain. EXAM: CT ANGIOGRAPHY CHEST WITH CONTRAST TECHNIQUE: Multidetector CT imaging of the chest was performed using the standard protocol during bolus administration of intravenous contrast. Multiplanar CT image reconstructions and MIPs were obtained to evaluate the vascular anatomy. CONTRAST:  1m ISOVUE-370 IOPAMIDOL (ISOVUE-370) INJECTION 76% COMPARISON:  Chest radiograph yesterday. FINDINGS: Cardiovascular: There are no filling defects within the pulmonary arteries to suggest pulmonary embolus. Multi chamber cardiomegaly. Thoracic aorta is normal in caliber without dissection. Mild aortic atherosclerosis. Small to moderate pericardial effusion measures up to 2 cm in depth. This is slightly complex. There are coronary artery calcifications. Mediastinum/Nodes: Small mediastinal and hilar lymph nodes not enlarged by size criteria. Mild enlargement of the right thyroid gland without dominant nodule. The esophagus is decompressed. Small hiatal hernia. Lungs/Pleura: Ground-glass opacities in the dependent upper lobes, diffusely throughout the right and left lower lobe and to a lesser extent right middle lobe. Findings are most prominent in a dependent distribution. No septal thickening. No pleural effusion. Trachea and mainstem bronchi are patent. Upper Abdomen: No acute findings. Musculoskeletal: Multilevel degenerative change in the spine. There are no acute or suspicious osseous abnormalities. Sclerosis about posterior right fourth rib at the chondro vertebral junction likely secondary to remote prior fracture. Review of the MIP images confirms the above findings. IMPRESSION: 1. No pulmonary embolus. 2. Multifocal ground-glass  opacities, most prominent in the lung bases. Findings may represent pulmonary edema, infectious or inflammatory etiologies, or developing ARDS. 3. Cardiomegaly with small to moderate pericardial effusion. 4. Aortic Atherosclerosis (ICD10-I70.0). Coronary artery calcifications. Electronically Signed   By: MJeb LeveringM.D.   On: 10/20/2017 05:38    Scheduled Meds: . [START ON 10/22/2017] azithromycin  500 mg Oral Q24H  . colchicine  0.6 mg Oral BID  . diltiazem  360 mg Oral Daily  . ibuprofen  600 mg Oral TID  . insulin aspart  0-15 Units Subcutaneous TID WC  . insulin aspart  0-5 Units Subcutaneous QHS  . irbesartan  150 mg Oral Daily  . latanoprost  1  drop Both Eyes QHS  . levothyroxine  75 mcg Oral QAC breakfast  . mouth rinse  15 mL Mouth Rinse BID  . metoprolol tartrate  12.5 mg Oral BID  . metoprolol tartrate  50 mg Oral Once  . mometasone-formoterol  2 puff Inhalation BID  . [START ON 10/22/2017] pantoprazole  40 mg Oral Q0600  . potassium chloride  20 mEq Oral Daily  . rosuvastatin  40 mg Oral Daily   Continuous Infusions: . cefTRIAXone (ROCEPHIN)  IV Stopped (10/21/17 0815)     LOS: 1 day   Time spent: 25 minutes.  Patrecia Pour, MD [facility] [email or web address] Password [facility] 10/21/2017, 6:30 PM

## 2017-10-21 NOTE — Consult Note (Signed)
Name: [name] MRN: [id number] DOB: 02-20-1951    ADMISSION DATE:  10/19/2017 CONSULTATION DATE:  6/12  REFERRING MD :  Larose Kells  CHIEF COMPLAINT:  Hypoxia   BRIEF PATIENT DESCRIPTION: 67yo female with hx chronic respiratory failure, asthma, HTN, OSA, DM presented 6/10 with SOB.  She wears 3L [place] O2 at baseline.  ER w/u included CTA chest which was neg for PE but showed diffuse ground glass opacities. She was admitted by [facility] with acute on chronic respiratory failure r/t CHF v infection v inflammatory process.  PCCM consulted to assist with diff dx of CT changes.   SIGNIFICANT EVENTS    STUDIES:  CTA chest 6/11>>>1. No pulmonary embolus. 2. Multifocal ground-glass opacities, most prominent in the lung bases. Findings may represent pulmonary edema, infectious or inflammatory etiologies, or developing ARDS. 3. Cardiomegaly with small to moderate pericardial effusion. 4. Aortic Atherosclerosis (ICD10-I70.0). Coronary artery calcifications. 2D echo 6/11>>> EF 55-60%, trivial MR, mild pericardial effusion without evidence of tamponade   HISTORY OF PRESENT ILLNESS:   67yo female with hx chronic respiratory failure, asthma, HTN, OSA, DM presented 6/10 with SOB.  She wears 3L [place] O2 at baseline.  ER w/u included CTA chest which was neg for PE but showed diffuse ground glass opacities. She was admitted by [facility] with acute on chronic respiratory failure r/t CHF v infection v inflammatory process.  PCCM consulted to assist with diff dx of CT changes.  Currently c/o some mild SOB but improved. Does have cough, occasionally productive of clear sputum.  Denies fevers, chills, hemoptysis, weight loss.  C/o mild BLE edema but denies orthopnea.    No known family hx of autoimmune disease.   Compliant with CPAP at home.    PAST MEDICAL HISTORY :   has a past medical history of Anterolisthesis, Bronchospasm (05/28/2012), CHEST PAIN (11/18/2007), DEGENERATIVE JOINT DISEASE (10/06/2006), DEPRESSION  (09/26/2008), DIABETES MELLITUS (10/06/2006), Diverticulosis, GERD (gastroesophageal reflux disease) (07/25/2013), HYPERLIPIDEMIA (01/11/2009), HYPERTENSION (10/06/2006), INSOMNIA (09/26/2008), Internal hemorrhoids, OBSTRUCTIVE SLEEP APNEA (06/23/2008), Pain in joint, multiple sites (11/10/2006), and UNSPECIFIED ANEMIA (12/10/2009).  has a past surgical history that includes Right knee replacement (2005); Left knee replacement (07/2007); and Colonoscopy (08/01/2011). Prior to Admission medications   Medication Sig Start Date End Date Taking? Authorizing Provider  ACCU-CHEK AVIVA PLUS test strip TEST BLOD SUGAR TWICE DAILY AND LANCETS TWICE DAILY 10/15/17  Yes Renato Shin, MD  ACCU-CHEK SOFTCLIX LANCETS lancets USE TO CHECK BLOOD SUGAR TWICE A DAY 10/05/17  Yes Renato Shin, MD  albuterol (PROVENTIL HFA;VENTOLIN HFA) 108 (90 BASE) MCG/ACT inhaler Inhale 2 puffs into the lungs every 4 (four) hours as needed for wheezing. 06/01/14 07/30/25 Yes Paz, Alda Berthold, MD  budesonide-formoterol [facility]) 160-4.5 MCG/ACT inhaler Inhale 2 puffs into the lungs 2 (two) times daily. Patient taking differently: Inhale 2 puffs into the lungs daily as needed (SOB).  09/01/14  Yes Paz, Alda Berthold, MD  diltiazem (CARDIZEM CD) 360 MG 24 hr capsule Take 1 capsule (360 mg total) by mouth daily. 09/30/17  Yes Paz, Alda Berthold, MD  Insulin Lispro Prot & Lispro (HUMALOG MIX 50/50 KWIKPEN) (50-50) 100 UNIT/ML Kwikpen Inject 60 Units into the skin 2 (two) times daily with a meal. And pen needles 2/day Patient taking differently: Inject 49-50 Units into the skin 2 (two) times daily with a meal. And pen needles 2/day 08/04/17  Yes Renato Shin, MD  latanoprost (XALATAN) 0.005 % ophthalmic solution USE 1 DROP IN BOTH EYES AT BEDTIME 12/09/16  Yes [provider]  levothyroxine (SYNTHROID, LEVOTHROID) 50 MCG tablet Take 50 mcg by mouth daily before breakfast.   Yes [provider]  OXYGEN Inhale 3 L into the lungs continuous.    Yes [provider]  potassium chloride (K-DUR,KLOR-CON) 10 MEQ tablet TAKE 1 TABLET(10 MEQ) BY MOUTH DAILY 10/15/17  Yes Paz, Alda Berthold, MD  rosuvastatin (CRESTOR) 40 MG tablet Take 1 tablet (40 mg total) by mouth daily. 04/30/17  Yes Paz, Alda Berthold, MD  telmisartan (MICARDIS) 40 MG tablet Take 1 tablet (40 mg total) by mouth daily. 06/08/17  Yes Colon Branch, MD  aspirin EC 81 MG tablet Take 81 mg by mouth daily.    [provider]   No Known Allergies  FAMILY HISTORY:  family history includes Asthma in her mother; Diabetes in her other; Hypertension in her sister; Pancreatic cancer in her brother; Stroke in her mother. SOCIAL HISTORY:  reports that she has been smoking cigarettes.  She has a 0.80 pack-year smoking history. She has never used smokeless tobacco. She reports that she drinks alcohol. She reports that she does not use drugs.  REVIEW OF SYSTEMS:   As per HPI - All other systems reviewed and were neg.    SUBJECTIVE:   VITAL SIGNS: Temp:  [98 F (36.7 C)-98.4 F (36.9 C)] 98.2 F (36.8 C) (06/12 1325) Pulse Rate:  [97-102] 102 (06/12 1347) Resp:  [16-32] 20 (06/12 1325) BP: (130-165)/(74-99) 156/85 (06/12 1347) SpO2:  [97 %-100 %] 100 % (06/12 1347) Weight:  [129 kg (284 lb 6.3 oz)-129.4 kg (285 lb 4.4 oz)] 129.4 kg (285 lb 4.4 oz) (06/12 0615)  PHYSICAL EXAMINATION: General:  Obese, chronically ill appearing female, NAD in bed  Neuro:  Awake, alert, appropriate, flat affect, slow to respond to questions at times HEENT:  Mm moist, no JVD  Cardiovascular:  s1s2 rrr Lungs:  resps even non labored on 3L [place], bibasilar crackles  Abdomen:  Obese, round, soft, non tender  Musculoskeletal: warm and dry, Scant BLE edema    Recent Labs  Lab 10/19/17 2325 10/20/17 0847 10/21/17 0516  NA 139 139 139  K 3.1* 4.0 4.2  CL 105 103 105  CO2 '24 27 27  '$ BUN '12 11 13  '$ CREATININE 1.08* 1.00 1.03*  GLUCOSE 39* 135* 160*   Recent Labs  Lab 10/19/17 2325 10/21/17 0516  HGB 11.2*  10.8*  HCT 35.7* 35.0*  WBC 6.4 5.1  PLT 457* 442*   Dg Chest 2 View  Result Date: 10/20/2017 CLINICAL DATA:  Chest pain and shortness of breath EXAM: CHEST - 2 VIEW COMPARISON:  Sep 11, 2017 FINDINGS: Cardiomegaly. Mild edema. Mild patchy opacity in the lateral left lung base is more prominent the interval. No other interval changes. IMPRESSION: 1. Cardiomegaly and mild edema. 2. Mild patchy opacity in the lateral left lung base. Recommend follow-up to resolution. Electronically Signed   By: Dorise Bullion III M.D   On: 10/20/2017 00:03   Ct Angio Chest Pe W And/or Wo Contrast  Result Date: 10/20/2017 CLINICAL DATA:  Shortness of breath.  Chest pain. EXAM: CT ANGIOGRAPHY CHEST WITH CONTRAST TECHNIQUE: Multidetector CT imaging of the chest was performed using the standard protocol during bolus administration of intravenous contrast. Multiplanar CT image reconstructions and MIPs were obtained to evaluate the vascular anatomy. CONTRAST:  14m ISOVUE-370 IOPAMIDOL (ISOVUE-370) INJECTION 76% COMPARISON:  Chest radiograph yesterday. FINDINGS: Cardiovascular: There are no filling defects within the pulmonary arteries to suggest pulmonary embolus. Multi chamber cardiomegaly. Thoracic aorta  is normal in caliber without dissection. Mild aortic atherosclerosis. Small to moderate pericardial effusion measures up to 2 cm in depth. This is slightly complex. There are coronary artery calcifications. Mediastinum/Nodes: Small mediastinal and hilar lymph nodes not enlarged by size criteria. Mild enlargement of the right thyroid gland without dominant nodule. The esophagus is decompressed. Small hiatal hernia. Lungs/Pleura: Ground-glass opacities in the dependent upper lobes, diffusely throughout the right and left lower lobe and to a lesser extent right middle lobe. Findings are most prominent in a dependent distribution. No septal thickening. No pleural effusion. Trachea and mainstem bronchi are patent. Upper Abdomen: No  acute findings. Musculoskeletal: Multilevel degenerative change in the spine. There are no acute or suspicious osseous abnormalities. Sclerosis about posterior right fourth rib at the chondro vertebral junction likely secondary to remote prior fracture. Review of the MIP images confirms the above findings. IMPRESSION: 1. No pulmonary embolus. 2. Multifocal ground-glass opacities, most prominent in the lung bases. Findings may represent pulmonary edema, infectious or inflammatory etiologies, or developing ARDS. 3. Cardiomegaly with small to moderate pericardial effusion. 4. Aortic Atherosclerosis (ICD10-I70.0). Coronary artery calcifications. Electronically Signed   By: Jeb Levering M.D.   On: 10/20/2017 05:38    Autoimmune: ESR 88 ANA - POS  (BARELY pos in 2008 - ??actual value this time) Anti-Jo - wnl  dsDNA - wnl HIV - NR  CRP - POS (26.9)    ASSESSMENT / PLAN:  Acute on chronic hypoxic respiratory failure with diffuse GGO.  Edema v autoimmune. POS ANA (although pos in 2008 but only marginally so), CRP POS and elevated ESR. Less likely infectious given clinical picture.  No honeycombing or other CT changes to suggest ILD at this time.  Most likely r/t edema.    PLAN -  Continue diuresis for now  Mobilize  Add qhs CPAP  Wean O2 as able -- baseline 3L [place]  If ongoing hypoxia or dyspnea above baseline could consider further w/u - ?bx    Nickolas Madrid, NP 10/21/2017  1:48 PM Pager: [phone] or [phone]

## 2017-10-21 NOTE — Progress Notes (Signed)
Inpatient Diabetes Program Recommendations  AACE/ADA: New Consensus Statement on Inpatient Glycemic Control (2015)  Target Ranges:  Prepandial:   less than 140 mg/dL      Peak postprandial:   less than 180 mg/dL (1-2 hours)      Critically ill patients:  140 - 180 mg/dL   Lab Results  Component Value Date   GLUCAP 194 (H) 10/21/2017   HGBA1C 5.6 10/20/2017    Review of Glycemic Control Results for [name], [name] (MRN [id number]) as of 10/21/2017 13:34  Ref. Range 10/20/2017 16:26 10/20/2017 21:07 10/21/2017 07:33 10/21/2017 12:10  Glucose-Capillary Latest Ref Range: 65 - 99 mg/dL 213 (H) 230 (H) 157 (H) 194 (H)   Diabetes history: Type 2 DM Outpatient Diabetes medications: Humalog 50/50 45-48 units BID Current orders for Inpatient glycemic control: Novolog 0-15 units TID, Novolog 0-5 units QHS.  Inpatient Diabetes Program Recommendations: Outpatient Referral: Recommend decreasing outpatient Humalog 50/50 at time of discharge. MD may want to consider decreasing to Humalog 50/50 25 units BID (which will provide about 25 units for basal and about 25 units for meal coverage per day) and have patient follow up with Endocrinologist. See remainder of note from previous coordinator on 10/20/17. Will continue to follow given new orders.   Thanks, Bronson Curb, MSN, RNC-OB Diabetes Coordinator [phone] (8a-5p)

## 2017-10-21 NOTE — Consult Note (Signed)
   [facility] CM Inpatient Consult   10/21/2017  [name] 1950-09-24 [id number]  Patient is currently active with [facility] for chronic disease management services.  Patient has been engaged by a [employer] and CSW.  Our community based plan of care has focused on disease management and community resource support.  Patient will receive a post hospital call and will be evaluated for monthly home visits for assessments and disease process education.  Made Inpatient Case Manager aware that [facility] following. Consent form signed and folder given with copies of consent.  Patient wants some personal care services for follow up.  States, "I was suppose to call and follow up but I was too sick and I was trying my best to handle this at home."  [facility] will continue to follow for disposition and needs. Of note, [facility] Care Management services does not replace or interfere with any services that are needed or arranged by inpatient case management or social work.  For additional questions or referrals please contact:  Natividad Brood, RN BSN [facility] Liaison  [phone] business mobile phone Toll free office [phone]

## 2017-10-21 NOTE — Patient Outreach (Signed)
[facility]) Care Management  10/21/2017  [name] Nov 22, 1950 [id number]  [name] a 67 y.o.femalewith past medical history which includes diabetes mellitus type II, hyperlipidemia, hypertension, gastroesophageal reflux disease, hypothyroidism, obstructive sleep apnea, anemia, diverticulosis, degenerative joint disease with history of anterolisthesis L4-5, and depression. [name] recently visited the ED (x 3 over a 5 day period) with recurrent epistaxis. [name] was referred to [facility] [facility] for assistance with chronic disease management and care coordination related to her ENT care needs.I have been in contact since with [name] since 08/06/17 and we last spoke on 10/14/17. [name] was admitted to the hospital via ED yesterday when she presented with acute shortness of breath and hypoglycemia. She is being treated for acute respiratory failure.   Plan: [name] has been enrolled in our telephonic case management program but might be better served in our community case management program. I will refer [name] to our community team for outreach at discharge for transition of care services and ongoing community case management.    [facility]  [phone]

## 2017-10-21 NOTE — Progress Notes (Addendum)
Progress Note  Patient Name: [name] Date of Encounter: 10/21/2017  Primary Cardiologist: Fransico Him, MD   Subjective   Reports improvement in her breathing since presentation to the hospital. Still having intermittent chest pain which is worse when she sits forward but occurs while laying down as well. Described as a pressure sensation which occasionally radiates to her back. States it last for 20-25 minutes and resolves spontaneously.   Inpatient Medications    Scheduled Meds: . diltiazem  360 mg Oral Daily  . furosemide  40 mg Intravenous Once  . irbesartan  150 mg Oral Daily  . latanoprost  1 drop Both Eyes QHS  . levothyroxine  75 mcg Oral QAC breakfast  . mouth rinse  15 mL Mouth Rinse BID  . mometasone-formoterol  2 puff Inhalation BID  . potassium chloride  20 mEq Oral Daily  . rosuvastatin  40 mg Oral Daily   Continuous Infusions: . azithromycin 500 mg (10/21/17 0833)  . cefTRIAXone (ROCEPHIN)  IV Stopped (10/21/17 0815)   PRN Meds: acetaminophen **OR** acetaminophen, albuterol, ondansetron **OR** ondansetron (ZOFRAN) IV   Vital Signs    Vitals:   10/20/17 1916 10/21/17 0004 10/21/17 0615 10/21/17 0837  BP: (!) 145/81 130/74 (!) 150/86 (!) 165/99  Pulse: 98 (!) 101 (!) 101 97  Resp: _0 Temp: 98.4 F (36.9 C) 98 F (36.7 C) 98 F (36.7 C)   TempSrc: Oral Oral Oral   SpO2: 98% 98% 98% 99%  Weight:   285 lb 4.4 oz (129.4 kg)   Height:        Intake/Output Summary (Last 24 hours) at 10/21/2017 1029 Last data filed at 10/21/2017 0500 Gross per 24 hour  Intake 360 ml  Output 1400 ml  Net -1040 ml   Filed Weights   10/19/17 2318 10/20/17 1540 10/21/17 0615  Weight: 285 lb (129.3 kg) 284 lb 6.3 oz (129 kg) 285 lb 4.4 oz (129.4 kg)    Telemetry    Sinus rhythm with occasional 1st degree AV block - Personally Reviewed  Physical Exam   GEN: Laying in bed comfortably with some increased work of breathing observed.   Neck: No JVD, no  carotid bruits Cardiac: RRR, no murmurs, rubs, or gallops.  Respiratory: crackles noted at bases bilaterally GI: NABS, obese, soft, nontender, non-distended  MS: No edema; No deformity. Neuro:  Nonfocal, moving all extremities spontaneously Psych: Normal affect   Labs    Chemistry Recent Labs  Lab 10/19/17 2325 10/20/17 0847 10/21/17 0516  NA 139 139 139  K 3.1* 4.0 4.2  CL 105 103 105  CO2 _1 GLUCOSE 39* 135* 160*  BUN _2 CREATININE 1.08* 1.00 1.03*  CALCIUM 8.6* 8.4* 8.4*  PROT  --  7.4  --   ALBUMIN  --  2.6*  --   AST  --  25  --   ALT  --  16  --   ALKPHOS  --  49  --   BILITOT  --  0.6  --   GFRNONAA 52* 57* 55*  GFRAA 60* >60 >60  ANIONGAP _3 Hematology Recent Labs  Lab 10/19/17 2325 10/21/17 0516  WBC 6.4 5.1  RBC 4.64 4.52  HGB 11.2* 10.8*  HCT 35.7* 35.0*  MCV 76.9* 77.4*  MCH 24.1* 23.9*  MCHC 31.4 30.9  RDW 19.9* 20.1*  PLT 457* 442*    Cardiac Enzymes Recent Labs  Lab 10/20/17 0847 10/20/17 1231 10/20/17 1848  TROPONINI <0.03 0.03* 0.03*    Recent Labs  Lab 10/19/17 2331  TROPIPOC 0.04     BNP Recent Labs  Lab 10/20/17 0419 10/20/17 1848  BNP 253.5* 135.4*     DDimer No results for input(s): DDIMER in the last 168 hours.   Radiology    Dg Chest 2 View  Result Date: 10/20/2017 CLINICAL DATA:  Chest pain and shortness of breath EXAM: CHEST - 2 VIEW COMPARISON:  Sep 11, 2017 FINDINGS: Cardiomegaly. Mild edema. Mild patchy opacity in the lateral left lung base is more prominent the interval. No other interval changes. IMPRESSION: 1. Cardiomegaly and mild edema. 2. Mild patchy opacity in the lateral left lung base. Recommend follow-up to resolution. Electronically Signed   By: Dorise Bullion III M.D   On: 10/20/2017 00:03   Ct Angio Chest Pe W And/or Wo Contrast  Result Date: 10/20/2017 CLINICAL DATA:  Shortness of breath.  Chest pain. EXAM: CT ANGIOGRAPHY CHEST WITH CONTRAST TECHNIQUE: Multidetector CT  imaging of the chest was performed using the standard protocol during bolus administration of intravenous contrast. Multiplanar CT image reconstructions and MIPs were obtained to evaluate the vascular anatomy. CONTRAST:  30m ISOVUE-370 IOPAMIDOL (ISOVUE-370) INJECTION 76% COMPARISON:  Chest radiograph yesterday. FINDINGS: Cardiovascular: There are no filling defects within the pulmonary arteries to suggest pulmonary embolus. Multi chamber cardiomegaly. Thoracic aorta is normal in caliber without dissection. Mild aortic atherosclerosis. Small to moderate pericardial effusion measures up to 2 cm in depth. This is slightly complex. There are coronary artery calcifications. Mediastinum/Nodes: Small mediastinal and hilar lymph nodes not enlarged by size criteria. Mild enlargement of the right thyroid gland without dominant nodule. The esophagus is decompressed. Small hiatal hernia. Lungs/Pleura: Ground-glass opacities in the dependent upper lobes, diffusely throughout the right and left lower lobe and to a lesser extent right middle lobe. Findings are most prominent in a dependent distribution. No septal thickening. No pleural effusion. Trachea and mainstem bronchi are patent. Upper Abdomen: No acute findings. Musculoskeletal: Multilevel degenerative change in the spine. There are no acute or suspicious osseous abnormalities. Sclerosis about posterior right fourth rib at the chondro vertebral junction likely secondary to remote prior fracture. Review of the MIP images confirms the above findings. IMPRESSION: 1. No pulmonary embolus. 2. Multifocal ground-glass opacities, most prominent in the lung bases. Findings may represent pulmonary edema, infectious or inflammatory etiologies, or developing ARDS. 3. Cardiomegaly with small to moderate pericardial effusion. 4. Aortic Atherosclerosis (ICD10-I70.0). Coronary artery calcifications. Electronically Signed   By: MJeb LeveringM.D.   On: 10/20/2017 05:38    Cardiac  Studies   Echocardiogram 10/20/17: Study Conclusions  - Left ventricle: The cavity size was normal. There was mild concentric hypertrophy. Systolic function was normal. The estimated ejection fraction was in the range of 55% to 60%. Wall motion was normal; there were no regional wall motion abnormalities. Left ventricular diastolic function parameters were normal. - Aortic valve: Trileaflet; mildly thickened, mildly calcified leaflets. - Mitral valve: Calcified annulus. Mildly thickened leaflets . There was trivial regurgitation. - Right ventricle: The cavity size was mildly dilated. Wall thickness was normal. Systolic function was mildly reduced. - Right atrium: The atrium was normal in size. - Pulmonic valve: There was mild regurgitation. - Pulmonary arteries: Systolic pressure was at the upper limits of normal. - Inferior vena cava: The vessel was dilated. The respirophasic diameter changes were blunted (<50%), consistent with elevated central venous pressure. - Pericardium, extracardiac: There was  small pericardial effusion     Patient Profile     67 y.o. female with a PMH of HTN, HLD, DM type 2, obesity, OSA on CPAP, and hypothyroidism, who presented with SOB. Cardiology following for pericardial effusion and possible diastolic CHF.   Assessment & Plan    1. Pericardial effusion: small pericardial effusion noted on echo 10/20/17. Thought to not be hemodynamically significant. Possible there is an inflammatory component as ANA positive and ESR elevated. Also with elevated BNP - effusion may reflect some diastolic dysfunction.  - No evidence of tamponade   2. DOE: primary complaint from patient at the time of admission which has been progressively worsening over the past 4 months. CT Chest without PE but did have ground glass opacities c/f pulmonary edema vs infection vs inflammation; also noted to have small-mod pericardial effusion and aortic  atherosclerosis and coronary artery calcifications. Trop trend flat at 0.03 x2. EKG with sinus tachycardia with occasional PAC, no STE/D, no TWI. Last ischemic evaluation was a NST 09/2016 without ischemia or infarction. BNP trended down 253>135 after one dose of IV lasix 81m. UOP net -1L this admission. Weight stable at 285lbs. Procal negative. On azithro/CTX for possible infection. With some crackles at lung bases on exam today - Will give an additional IV lasix 464mthis AM - Continue antibiotics per primary team - Wean O2 as tolerated.   3. Chest pain: atypical chest pain which occurs primarily at rest but is occasionally exacerbated by bending forward. Described as a pressure sensation which resolves spontaneously after 20-25 minutes. Trop trend 0.03 x2, not consistent with ACS. EKG without ischemic changes. Recent NST 09/2016 without ischemia - Could consider Coronary CTA later today given risk factors of HTN, HLD, DM, OSA, and obesity  4. HTN: BP persistently elevated this admission on home diltiazem 36071mnd ARB alternative. - Could consider adding Bblocker for better BP and HR control (with intermittent sinus tachycardia)      For questions or updates, please contact [facility] consult [email or web address] for contact info under Cardiology/STEMI.      Signed, KriAbigail ButtsA-C  10/21/2017, 10:29 AM   [phone]

## 2017-10-21 NOTE — Telephone Encounter (Signed)
Left message requesting a return call.

## 2017-10-21 NOTE — Telephone Encounter (Signed)
Attempted to reach patient again.  Left another message asking for a return call.

## 2017-10-21 NOTE — Telephone Encounter (Signed)
Please call patient, laboratory evaluation showed elevated C-reactive protein, positive ANA, indicating inflammatory process,  this could be obscured by her recent emergency room presentation, breathing difficulties,  Will reevaluate at her EMG nerve conduction study

## 2017-10-21 NOTE — Progress Notes (Signed)
Pt refusing to wear CPAP for the night. RT will continue to monitor as needed. 

## 2017-10-22 ENCOUNTER — Inpatient Hospital Stay (HOSPITAL_COMMUNITY)
Admission: EM | Disposition: A | Discharge status: Home / Self Care | Payer: Self-pay | Source: Home / Self Care | Attending: Family Medicine

## 2017-10-22 ENCOUNTER — Encounter: Payer: Self-pay | Admitting: *Deleted

## 2017-10-22 ENCOUNTER — Inpatient Hospital Stay (HOSPITAL_COMMUNITY): Payer: Medicare Other

## 2017-10-22 DIAGNOSIS — R072 Precordial pain: Secondary | ICD-10-CM

## 2017-10-22 HISTORY — PX: LEFT HEART CATH AND CORONARY ANGIOGRAPHY: CATH118249

## 2017-10-22 LAB — BASIC METABOLIC PANEL
Anion gap: 9 (ref 5–15)
BUN: 14 mg/dL (ref 6–20)
CALCIUM: 8.9 mg/dL (ref 8.9–10.3)
CO2: 29 mmol/L (ref 22–32)
CREATININE: 0.95 mg/dL (ref 0.44–1.00)
Chloride: 103 mmol/L (ref 101–111)
GLUCOSE: 142 mg/dL — AB (ref 65–99)
Potassium: 4.4 mmol/L (ref 3.5–5.1)
Sodium: 141 mmol/L (ref 135–145)

## 2017-10-22 LAB — GLUCOSE, CAPILLARY
GLUCOSE-CAPILLARY: 159 mg/dL — AB (ref 65–99)
Glucose-Capillary: 147 mg/dL — ABNORMAL HIGH (ref 65–99)
Glucose-Capillary: 130 mg/dL — ABNORMAL HIGH (ref 65–99)
Glucose-Capillary: 138 mg/dL — ABNORMAL HIGH (ref 65–99)

## 2017-10-22 LAB — PROTIME-INR
INR: 1.12
Prothrombin Time: 14.3 seconds (ref 11.4–15.2)

## 2017-10-22 SURGERY — LEFT HEART CATH AND CORONARY ANGIOGRAPHY
Anesthesia: LOCAL

## 2017-10-22 MED ORDER — METOPROLOL TARTRATE 5 MG/5ML IV SOLN
5.0000 mg | INTRAVENOUS | Status: AC | PRN
  Administered 2017-10-22 (×4): 5 mg via INTRAVENOUS
  Filled 2017-10-22: qty 5

## 2017-10-22 MED ORDER — LIDOCAINE HCL (PF) 1 % IJ SOLN
INTRAMUSCULAR | Status: AC
  Filled 2017-10-22: qty 30

## 2017-10-22 MED ORDER — IOPAMIDOL (ISOVUE-370) INJECTION 76%
INTRAVENOUS | Status: AC
  Filled 2017-10-22: qty 100

## 2017-10-22 MED ORDER — SODIUM CHLORIDE 0.9% FLUSH
3.0000 mL | Freq: Two times a day (BID) | INTRAVENOUS | Status: DC
  Administered 2017-10-22 – 2017-10-24 (×4): 3 mL via INTRAVENOUS

## 2017-10-22 MED ORDER — MIDAZOLAM HCL 2 MG/2ML IJ SOLN
INTRAMUSCULAR | Status: DC | PRN
  Administered 2017-10-22: 2 mg via INTRAVENOUS

## 2017-10-22 MED ORDER — VERAPAMIL HCL 2.5 MG/ML IV SOLN
INTRAVENOUS | Status: DC | PRN
  Administered 2017-10-22: 10 mL via INTRA_ARTERIAL

## 2017-10-22 MED ORDER — FENTANYL CITRATE (PF) 100 MCG/2ML IJ SOLN
INTRAMUSCULAR | Status: DC | PRN
  Administered 2017-10-22: 25 ug via INTRAVENOUS

## 2017-10-22 MED ORDER — ONDANSETRON HCL 4 MG/2ML IJ SOLN: 4.0000 mg | Freq: Four times a day (QID) | INTRAMUSCULAR | Status: DC | PRN

## 2017-10-22 MED ORDER — SODIUM CHLORIDE 0.9 % IV SOLN: 250.0000 mL | INTRAVENOUS | Status: DC | PRN

## 2017-10-22 MED ORDER — FENTANYL CITRATE (PF) 100 MCG/2ML IJ SOLN
INTRAMUSCULAR | Status: AC
  Filled 2017-10-22: qty 2

## 2017-10-22 MED ORDER — MIDAZOLAM HCL 2 MG/2ML IJ SOLN
INTRAMUSCULAR | Status: AC
  Filled 2017-10-22: qty 2

## 2017-10-22 MED ORDER — LIDOCAINE HCL (PF) 1 % IJ SOLN
INTRAMUSCULAR | Status: DC | PRN
  Administered 2017-10-22: 2 mL

## 2017-10-22 MED ORDER — SODIUM CHLORIDE 0.9% FLUSH
3.0000 mL | Freq: Two times a day (BID) | INTRAVENOUS | Status: DC
  Administered 2017-10-22: 3 mL via INTRAVENOUS

## 2017-10-22 MED ORDER — IOPAMIDOL (ISOVUE-370) INJECTION 76%
INTRAVENOUS | Status: DC | PRN
  Administered 2017-10-22: 40 mL via INTRA_ARTERIAL

## 2017-10-22 MED ORDER — ASPIRIN 81 MG PO CHEW
81.0000 mg | CHEWABLE_TABLET | ORAL | Status: AC
  Administered 2017-10-22: 81 mg via ORAL
  Filled 2017-10-22: qty 1

## 2017-10-22 MED ORDER — ACETAMINOPHEN 325 MG PO TABS: 650.0000 mg | ORAL_TABLET | ORAL | Status: DC | PRN

## 2017-10-22 MED ORDER — FUROSEMIDE 10 MG/ML IJ SOLN
40.0000 mg | Freq: Every day | INTRAMUSCULAR | Status: DC
  Administered 2017-10-22: 40 mg via INTRAVENOUS
  Filled 2017-10-22: qty 4

## 2017-10-22 MED ORDER — METOPROLOL TARTRATE 25 MG PO TABS
25.0000 mg | ORAL_TABLET | Freq: Two times a day (BID) | ORAL | Status: DC
  Administered 2017-10-22 – 2017-10-24 (×4): 25 mg via ORAL
  Filled 2017-10-22 (×4): qty 1

## 2017-10-22 MED ORDER — SODIUM CHLORIDE 0.9% FLUSH: 3.0000 mL | INTRAVENOUS | Status: DC | PRN

## 2017-10-22 MED ORDER — HEPARIN SODIUM (PORCINE) 1000 UNIT/ML IJ SOLN
INTRAMUSCULAR | Status: DC | PRN
  Administered 2017-10-22: 6000 [IU] via INTRAVENOUS

## 2017-10-22 MED ORDER — SODIUM CHLORIDE 0.9 % WEIGHT BASED INFUSION
1.0000 mL/kg/h | INTRAVENOUS | Status: DC
  Administered 2017-10-22: 1 mL/kg/h via INTRAVENOUS

## 2017-10-22 MED ORDER — HEPARIN (PORCINE) IN NACL 1000-0.9 UT/500ML-% IV SOLN
INTRAVENOUS | Status: AC
  Filled 2017-10-22: qty 1000

## 2017-10-22 MED ORDER — SODIUM CHLORIDE 0.9 % IV SOLN
INTRAVENOUS | Status: AC
  Administered 2017-10-22: 17:00:00 via INTRAVENOUS

## 2017-10-22 MED ORDER — HEPARIN (PORCINE) IN NACL 2-0.9 UNITS/ML
INTRAMUSCULAR | Status: AC | PRN
  Administered 2017-10-22: 1000 mL

## 2017-10-22 MED ORDER — HEPARIN SODIUM (PORCINE) 1000 UNIT/ML IJ SOLN
INTRAMUSCULAR | Status: AC
  Filled 2017-10-22: qty 1

## 2017-10-22 MED ORDER — VERAPAMIL HCL 2.5 MG/ML IV SOLN
INTRAVENOUS | Status: AC
  Filled 2017-10-22: qty 2

## 2017-10-22 MED ORDER — SODIUM CHLORIDE 0.9 % WEIGHT BASED INFUSION
3.0000 mL/kg/h | INTRAVENOUS | Status: DC
  Administered 2017-10-22: 3 mL/kg/h via INTRAVENOUS

## 2017-10-22 SURGICAL SUPPLY — 12 items
CATH 5FR JL3.5 JR4 ANG PIG MP (CATHETERS) ×1 IMPLANT
DEVICE RAD COMP TR BAND LRG (VASCULAR PRODUCTS) ×1 IMPLANT
GUIDEWIRE INQWIRE 1.5J.035X260 (WIRE) IMPLANT
HOVERMATT SINGLE USE (MISCELLANEOUS) ×1 IMPLANT
INQWIRE 1.5J .035X260CM (WIRE) ×2
KIT HEART LEFT (KITS) ×2 IMPLANT
NDL PERC 21GX4CM (NEEDLE) IMPLANT
NEEDLE PERC 21GX4CM (NEEDLE) ×2 IMPLANT
PACK CARDIAC CATHETERIZATION (CUSTOM PROCEDURE TRAY) ×2 IMPLANT
SHEATH RAIN RADIAL 21G 6FR (SHEATH) ×1 IMPLANT
TRANSDUCER W/STOPCOCK (MISCELLANEOUS) ×2 IMPLANT
TUBING CIL FLEX 10 FLL-RA (TUBING) ×2 IMPLANT

## 2017-10-22 NOTE — Progress Notes (Signed)
CCMD stated at 10am pt had SVT/wide QRS, pt non symptomatic  Primary RN aware

## 2017-10-22 NOTE — Progress Notes (Signed)
PROGRESS NOTE  DESA RECH  PYP:[id number] DOB: 10/14/1950 DOA: 10/19/2017 PCP: Colon Branch, MD   Brief Narrative: [name] is a a.ge female with a history of obesity, OSA, asthma, chronic hypoxic respiratory failure, HTN, HLD, IDDM and hypothyroidism who presented to the ED with acute worsening of shortness of breath at rest. She was hypoxic with nonspecific CXR findings. CTA chest showed no PE but demonstrated diffuse dependent GGOs and echocardiogram revealed a pericardial effusion without features of tamponade. Cardiology was consulted and the patient was admitted, started on diuresis. Coronary CT is ordered for ischemic evaluation. Pulmonology was consulted and is following as well. ESR was elevated at 88, so further work up is being considered pending clinical course with diuresis.  Assessment & Plan: Principal Problem:   Acute respiratory failure with hypoxia (HCC) Active Problems:   DM II (diabetes mellitus, type II), controlled ([place])   Hyperlipidemia   Obstructive sleep apnea   Essential hypertension   GERD (gastroesophageal reflux disease)   Morbid obesity (HCC)   Asthma, chronic   Shortness of breath   Pericardial effusion   Acute pulmonary edema (HCC)  Acute on chronic hypoxic respiratory failure: With multifocal nonspecific GGOs, mild pericardial effusion. BNP 135 (falsely low due to obesity?), troponin not elevated. Preserved EF and diastolic function on echo, mildly reduced RV function with dilated IVC. - ESR grossly elevated to 88 here, so suspect an inflammatory process involved. ANA was 1:40 in 2008, repeat on 10/19/2017 was positive without reported titer. Will repeat this to get titer. - Doubt infectious etiology with undetectable procalcitonin, continue off abx.  - Pt of Dr. Juanetta Gosling presenting with broad DDx, appreciate pulmonology involvement. CXR today does not show any improvement, though the patient is reporting improved symptoms. Plan to hold off on  further evaluation (e.g. Bx) pending diuresis.   Acute on chronic diastolic CHF:  - Diuresis with IV lasix (none PTA)  IDT2DM: Held insulin due to hypoglycemia, now CBGs rising. On humalog 50/50 45-48u BID but wasn't taking it like this due to symptomatic hypoglycemia. HbA1c 5.6% indicating too tight of control. - SSI AC/HS, at inpatient goal.  - Plan to tailor therapy going forward with anticipated DC on reduced dose humalog BID.  Hypothyroidism: Pt reports somewhat recently increasing synthroid dose. TSH 7.175.  - Synthroid dose increased back to outpatient dose of 96mg. - Will need PCP follow up and recheck in 4-6 weeks.  Asthma: No evidence of exacerbation - Continue dulera (for symbicort), prn nebs  Pericardial effusion: With chest pain worse when leaning forward and laying supine. Tn's 0.03.  - Cardiology started ibuprofen, colchicine.   Chest pain:  - CTA w/FFR ordered per cardiology for ischemic r/o.  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  - Monitor  Chronic anemia:  - Monitor  DVT prophylaxis: SCDs Code Status: Full Family Communication: None at bedside Disposition Plan: Uncertain  Consultants:   Cardiology  Pulmonology  Procedures:   None  Antimicrobials:  Azithromycin, CTX 6/10-6/12.   Subjective: Reports overall improvement today. No chest pain. Breathing is improved. Poor historian.  Objective: Vitals:   10/22/17 1206 10/22/17 1220 10/22/17 1224 10/22/17 1233  BP:      Pulse:  84 76 69  Resp: 18     Temp: 98.2 F (36.8 C)     TempSrc: Oral  SpO2:      Weight:      Height:        Intake/Output Summary (Last 24 hours) at 10/22/2017 1347 Last data filed at 10/22/2017 0803 Gross per 24 hour  Intake 340 ml  Output 1075 ml  Net -735 ml   Filed Weights   10/20/17 1540 10/21/17 0615 10/22/17 0407  Weight: 129 kg (284  lb 6.3 oz) 129.4 kg (285 lb 4.4 oz) 129.3 kg (285 lb 0.9 oz)    Gen: Calm, obese female in no distress laying in bed Pulm: Non-labored breathing, regular rate on 3L O2. Continues to have bibasilar crackles, diminished at bases as well.  CV: Regular rate and rhythm. No murmur, rub, or gallop. No JVD, trace pedal edema. GI: Abdomen soft, non-tender, non-distended, with normoactive bowel sounds. No organomegaly or masses felt. Ext: Warm, no deformities Skin: No rashes, lesions or ulcers Neuro: Alert and oriented. No focal neurological deficits. Psych: Judgement and insight appear fair. Mood euthymic & affect flattened.   Data Reviewed: I have personally reviewed following labs and imaging studies  CBC: Recent Labs  Lab 10/19/17 2325 10/21/17 0516  WBC 6.4 5.1  HGB 11.2* 10.8*  HCT 35.7* 35.0*  MCV 76.9* 77.4*  PLT 457* 354*   Basic Metabolic Panel: Recent Labs  Lab 10/19/17 2325 10/20/17 0847 10/21/17 0516 10/22/17 0645  NA 139 139 139 141  K 3.1* 4.0 4.2 4.4  CL 105 103 105 103  CO2 _0 GLUCOSE 39* 135* 160* 142*  BUN _1 CREATININE 1.08* 1.00 1.03* 0.95  CALCIUM 8.6* 8.4* 8.4* 8.9  MG  --  1.9  --   --    GFR: Estimated Creatinine Clearance: 81.8 mL/min (by C-G formula based on SCr of 0.95 mg/dL). Liver Function Tests: Recent Labs  Lab 10/20/17 0847  AST 25  ALT 16  ALKPHOS 49  BILITOT 0.6  PROT 7.4  ALBUMIN 2.6*   No results for input(s): LIPASE, AMYLASE in the last 168 hours. No results for input(s): AMMONIA in the last 168 hours. Coagulation Profile: No results for input(s): INR, PROTIME in the last 168 hours. Cardiac Enzymes: Recent Labs  Lab 10/19/17 1426 10/20/17 0847 10/20/17 1231 10/20/17 1848  CKTOTAL 153  --   --   --   TROPONINI  --  <0.03 0.03* 0.03*   BNP (last 3 results) No results for input(s): PROBNP in the last 8760 hours. HbA1C: Recent Labs    10/20/17 0847  HGBA1C 5.6   CBG: Recent Labs  Lab  10/21/17 1210 10/21/17 1708 10/21/17 2050 10/22/17 0736 10/22/17 1143  GLUCAP 194* 256* 213* 147* 130*   Lipid Profile: No results for input(s): CHOL, HDL, LDLCALC, TRIG, CHOLHDL, LDLDIRECT in the last 72 hours. Thyroid Function Tests: Recent Labs    10/20/17 0847  TSH 7.175*   Anemia Panel: Recent Labs    10/19/17 1426  VITAMINB12 313   Urine analysis:    Component Value Date/Time   COLORURINE YELLOW 11/30/2013 1057   APPEARANCEUR CLEAR 11/30/2013 1057   LABSPEC 1.010 11/30/2013 1057   PHURINE 5.5 11/30/2013 1057   GLUCOSEU NEGATIVE 11/30/2013 1057   HGBUR NEGATIVE 11/30/2013 1057   BILIRUBINUR NEGATIVE 11/30/2013 1057   KETONESUR NEGATIVE 11/30/2013 1057   PROTEINUR NEGATIVE 12/31/2011 1029   UROBILINOGEN 0.2 11/30/2013 1057   NITRITE NEGATIVE 11/30/2013 1057   LEUKOCYTESUR TRACE (A) 11/30/2013 1057   Recent Results (from the past 240 hour(s))  Blood culture (  routine x 2)     Status: None (Preliminary result)   Collection Time: 10/20/17  4:06 AM  Result Value Ref Range Status   Specimen Description BLOOD LEFT FOREARM  Final   Special Requests   Final    BOTTLES DRAWN AEROBIC AND ANAEROBIC Blood Culture adequate volume   Culture   Final    NO GROWTH 1 DAY Performed at [facility], [place] [address], [place], [place] [address]    Report Status PENDING  Incomplete  Blood culture (routine x 2)     Status: None (Preliminary result)   Collection Time: 10/20/17  4:19 AM  Result Value Ref Range Status   Specimen Description BLOOD RIGHT ANTECUBITAL  Final   Special Requests   Final    BOTTLES DRAWN AEROBIC AND ANAEROBIC Blood Culture adequate volume   Culture   Final    NO GROWTH 1 DAY Performed at [facility], [place] [address] [place], [place] [address]    Report Status PENDING  Incomplete      Radiology Studies: Dg Chest Port 1 View  Result Date: 10/22/2017 CLINICAL DATA:  Pulmonary edema EXAM: PORTABLE CHEST 1 VIEW COMPARISON:  10/19/2017  FINDINGS: Cardiomegaly with vascular congestion. Increasing perihilar and bibasilar opacities could reflect atelectasis or edema. No visible significant effusions or acute bony abnormality. IMPRESSION: Cardiomegaly with vascular congestion. Increasing perihilar and lower lobe atelectasis and/or edema. Electronically Signed   By: Rolm Baptise M.D.   On: 10/22/2017 12:17    Scheduled Meds: . colchicine  0.6 mg Oral BID  . diltiazem  360 mg Oral Daily  . furosemide  40 mg Intravenous Daily  . ibuprofen  600 mg Oral TID  . insulin aspart  0-15 Units Subcutaneous TID WC  . insulin aspart  0-5 Units Subcutaneous QHS  . iopamidol      . irbesartan  150 mg Oral Daily  . latanoprost  1 drop Both Eyes QHS  . levothyroxine  75 mcg Oral QAC breakfast  . mouth rinse  15 mL Mouth Rinse BID  . metoprolol tartrate  25 mg Oral BID  . mometasone-formoterol  2 puff Inhalation BID  . pantoprazole  40 mg Oral Q0600  . potassium chloride  20 mEq Oral Daily  . rosuvastatin  40 mg Oral Daily   Continuous Infusions:    LOS: 2 days   Time spent: 25 minutes.  Patrecia Pour, MD [facility] [email or web address] Password [email or web address] 10/22/2017, 1:47 PM

## 2017-10-22 NOTE — Progress Notes (Signed)
Progress Note  Patient Name: [name] Date of Encounter: 10/22/2017  Primary Cardiologist: Fransico Him, MD   Subjective   Patient reports continued improvement in her breathing. Denies chest pain overnight or this morning. Denies palpitations.   Inpatient Medications    Scheduled Meds: . colchicine  0.6 mg Oral BID  . diltiazem  360 mg Oral Daily  . ibuprofen  600 mg Oral TID  . insulin aspart  0-15 Units Subcutaneous TID WC  . insulin aspart  0-5 Units Subcutaneous QHS  . irbesartan  150 mg Oral Daily  . latanoprost  1 drop Both Eyes QHS  . levothyroxine  75 mcg Oral QAC breakfast  . mouth rinse  15 mL Mouth Rinse BID  . metoprolol tartrate  12.5 mg Oral BID  . mometasone-formoterol  2 puff Inhalation BID  . pantoprazole  40 mg Oral Q0600  . potassium chloride  20 mEq Oral Daily  . rosuvastatin  40 mg Oral Daily   Continuous Infusions:  PRN Meds: acetaminophen **OR** acetaminophen, albuterol, ondansetron **OR** ondansetron (ZOFRAN) IV   Vital Signs    Vitals:   10/22/17 0407 10/22/17 0800 10/22/17 0801 10/22/17 0949  BP: (!) 140/98 126/79    Pulse: 91 87  90  Resp: 20 18    Temp: 98.2 F (36.8 C)     TempSrc:      SpO2: 99% 100% 98%   Weight: 285 lb 0.9 oz (129.3 kg)     Height:        Intake/Output Summary (Last 24 hours) at 10/22/2017 1045 Last data filed at 10/22/2017 0803 Gross per 24 hour  Intake 580 ml  Output 2075 ml  Net -1495 ml   Filed Weights   10/20/17 1540 10/21/17 0615 10/22/17 0407  Weight: 284 lb 6.3 oz (129 kg) 285 lb 4.4 oz (129.4 kg) 285 lb 0.9 oz (129.3 kg)    Telemetry    NSR with occasional 1st degree AV block and one episode of NSVT (9 beats) - Personally Reviewed  Physical Exam   GEN: Laying in bed in no acute distress.   Neck: No JVD, no carotid bruits Cardiac: RRR, no murmurs, rubs, or gallops.  Respiratory: crackles at bases bilaterally; no wheezing or rhonchi GI: NABS, obese, soft, nontender, non-distended    MS: No edema; No deformity. Neuro:  Nonfocal, moving all extremities spontaneously Psych: Normal affect   Labs    Chemistry Recent Labs  Lab 10/20/17 0847 10/21/17 0516 10/22/17 0645  NA 139 139 141  K 4.0 4.2 4.4  CL 103 105 103  CO2 '27 27 29  '$ GLUCOSE 135* 160* 142*  BUN '11 13 14  '$ CREATININE 1.00 1.03* 0.95  CALCIUM 8.4* 8.4* 8.9  PROT 7.4  --   --   ALBUMIN 2.6*  --   --   AST 25  --   --   ALT 16  --   --   ALKPHOS 49  --   --   BILITOT 0.6  --   --   GFRNONAA 57* 55* >60  GFRAA >60 >60 >60  ANIONGAP '9 7 9     '$ Hematology Recent Labs  Lab 10/19/17 2325 10/21/17 0516  WBC 6.4 5.1  RBC 4.64 4.52  HGB 11.2* 10.8*  HCT 35.7* 35.0*  MCV 76.9* 77.4*  MCH 24.1* 23.9*  MCHC 31.4 30.9  RDW 19.9* 20.1*  PLT 457* 442*    Cardiac Enzymes Recent Labs  Lab 10/20/17 0847 10/20/17 1231 10/20/17  1848  TROPONINI <0.03 0.03* 0.03*    Recent Labs  Lab 10/19/17 2331  TROPIPOC 0.04     BNP Recent Labs  Lab 10/20/17 0419 10/20/17 1848  BNP 253.5* 135.4*     DDimer No results for input(s): DDIMER in the last 168 hours.   Radiology    No results found.  Cardiac Studies    Echocardiogram 10/20/17: Study Conclusions  - Left ventricle: The cavity size was normal. There was mild concentric hypertrophy. Systolic function was normal. The estimated ejection fraction was in the range of 55% to 60%. Wall motion was normal; there were no regional wall motion abnormalities. Left ventricular diastolic function parameters were normal. - Aortic valve: Trileaflet; mildly thickened, mildly calcified leaflets. - Mitral valve: Calcified annulus. Mildly thickened leaflets . There was trivial regurgitation. - Right ventricle: The cavity size was mildly dilated. Wall thickness was normal. Systolic function was mildly reduced. - Right atrium: The atrium was normal in size. - Pulmonic valve: There was mild regurgitation. - Pulmonary arteries:  Systolic pressure was at the upper limits of normal. - Inferior vena cava: The vessel was dilated. The respirophasic diameter changes were blunted (<50%), consistent with elevated central venous pressure. - Pericardium, extracardiac: There wassmall pericardial effusion    Patient Profile   67 y.o. female with a PMH of HTN, HLD, DM type 2, obesity, OSA on CPAP, and hypothyroidism, who presented with SOB. Cardiology following for pericardial effusion and possible diastolic CHF.   Assessment & Plan    1. Pericardial effusion: small pericardial effusion noted on echo 10/20/17. No evidence of tamponade. Possible there is an inflammatory component as ANA positive and ESR elevated. Also with elevated BNP - effusion may reflect some diastolic dysfunction. Started on colchicine and ibuprofen for possible pericarditis and IV lasix for diuresis.  - Continue colchicine and ibuprofen - Continue IV lasix daily  2. DOE: primary complaint from patient at the time of admission which has been progressively worsening over the past 4 months. CT Chest without PE but did have ground glass opacities c/f pulmonary edema vs infection vs inflammation; also noted to have small-mod pericardial effusion and aortic atherosclerosis and coronary artery calcifications. Trop trend flat at 0.03 x2. EKG with sinus tachycardia with occasional PAC, no STE/D, no TWI. Last ischemic evaluation was a NST 09/2016 without ischemia or infarction. BNP trended down 253>135 with IV lasix '40mg'$ . UOP net -1L in the last 24 hours, -2.2L this admission. Weight stable at 285lbs. Procal negative and antibiotics discontinued. Pulmonary team evaluated yesterday and recommended ANA titers and possible open lung biopsy if no improvement. Still with some crackles at lung bases on exam today - Continue IV lasix '40mg'$  daily - Continue antibiotics per primary team - Coronary CTA pending to evaluate coronary anatomy  3. Chest pain: atypical chest  pain which occurs primarily at rest but is occasionally exacerbated by bending forward. Described as a pressure sensation which resolves spontaneously after 20-25 minutes. Trop trend 0.03 x2, not consistent with ACS. EKG without ischemic changes. Recent NST 09/2016 without ischemia - Coronary CTA pending to evaluate coronary anatomy  4. HTN: BP persistently elevated this admission on home diltiazem '360mg'$  and ARB alternative. Started on metoprolol 12.'5mg'$  BID yesterday.  - Continue current regimen.      For questions or updates, please contact [place] Please consult [email or web address] for contact info under Cardiology/STEMI.      Signed, Abigail Butts, PA-C  10/22/2017, 10:45 AM   [phone]

## 2017-10-22 NOTE — Progress Notes (Signed)
    The risks and benefits of a cardiac catheterization including, but not limited to, death, stroke, MI, kidney damage and bleeding were discussed with the patient who indicates understanding and agrees to proceed.   Rosaria Ferries, PA-C 10/22/2017 2:21 PM [facility] [phone]

## 2017-10-22 NOTE — Progress Notes (Signed)
Cath showed normal coronary arteries, normal LVF and low LVEDP.  CP likely related to pericarditis.  Recommend Motrin 600mg  TID for 2 weeks and Colchicine 0.6mg  BID for 3 months along with PPI GI prophylaxis. Recommend Rheumatology evaluation.  Stop Lasix and potassium as LVEDP was low at cath.   Please have patient followup in our office in 1-2 weeks.  Continue to titrate BB for better BP control.  Will sign off.  Call with any questions.

## 2017-10-22 NOTE — Telephone Encounter (Signed)
Left third message requesting a return call.  I will also mail an "unable to contact" letter. We are happy to discuss results over the phone, if she calls back.  Dr. Krista Blue ordered cervical MRI and NCV/EMG at last visit.  These tests have not been scheduled yet.  Per notes, it appears [facility] reached out to the patient for the MRI but she stated she would call them back.  She did not schedule her NCV/EMG when she checked out of our office.

## 2017-10-22 NOTE — Progress Notes (Signed)
Removed patient's TR band. Level 0. Patient tolerated well. Has arm elevated on pillow. Will continue to monitor patient.

## 2017-10-22 NOTE — H&P (View-Only) (Signed)
Progress Note  Patient Name: [name] Date of Encounter: 10/22/2017  Primary Cardiologist: Fransico Him, MD   Subjective   Patient reports continued improvement in her breathing. Denies chest pain overnight or this morning. Denies palpitations.   Inpatient Medications    Scheduled Meds: . colchicine  0.6 mg Oral BID  . diltiazem  360 mg Oral Daily  . ibuprofen  600 mg Oral TID  . insulin aspart  0-15 Units Subcutaneous TID WC  . insulin aspart  0-5 Units Subcutaneous QHS  . irbesartan  150 mg Oral Daily  . latanoprost  1 drop Both Eyes QHS  . levothyroxine  75 mcg Oral QAC breakfast  . mouth rinse  15 mL Mouth Rinse BID  . metoprolol tartrate  12.5 mg Oral BID  . mometasone-formoterol  2 puff Inhalation BID  . pantoprazole  40 mg Oral Q0600  . potassium chloride  20 mEq Oral Daily  . rosuvastatin  40 mg Oral Daily   Continuous Infusions:  PRN Meds: acetaminophen **OR** acetaminophen, albuterol, ondansetron **OR** ondansetron (ZOFRAN) IV   Vital Signs    Vitals:   10/22/17 0407 10/22/17 0800 10/22/17 0801 10/22/17 0949  BP: (!) 140/98 126/79    Pulse: 91 87  90  Resp: 20 18    Temp: 98.2 F (36.8 C)     TempSrc:      SpO2: 99% 100% 98%   Weight: 285 lb 0.9 oz (129.3 kg)     Height:        Intake/Output Summary (Last 24 hours) at 10/22/2017 1045 Last data filed at 10/22/2017 0803 Gross per 24 hour  Intake 580 ml  Output 2075 ml  Net -1495 ml   Filed Weights   10/20/17 1540 10/21/17 0615 10/22/17 0407  Weight: 284 lb 6.3 oz (129 kg) 285 lb 4.4 oz (129.4 kg) 285 lb 0.9 oz (129.3 kg)    Telemetry    NSR with occasional 1st degree AV block and one episode of NSVT (9 beats) - Personally Reviewed  Physical Exam   GEN: Laying in bed in no acute distress.   Neck: No JVD, no carotid bruits Cardiac: RRR, no murmurs, rubs, or gallops.  Respiratory: crackles at bases bilaterally; no wheezing or rhonchi GI: NABS, obese, soft, nontender, non-distended    MS: No edema; No deformity. Neuro:  Nonfocal, moving all extremities spontaneously Psych: Normal affect   Labs    Chemistry Recent Labs  Lab 10/20/17 0847 10/21/17 0516 10/22/17 0645  NA 139 139 141  K 4.0 4.2 4.4  CL 103 105 103  CO2 '27 27 29  '$ GLUCOSE 135* 160* 142*  BUN '11 13 14  '$ CREATININE 1.00 1.03* 0.95  CALCIUM 8.4* 8.4* 8.9  PROT 7.4  --   --   ALBUMIN 2.6*  --   --   AST 25  --   --   ALT 16  --   --   ALKPHOS 49  --   --   BILITOT 0.6  --   --   GFRNONAA 57* 55* >60  GFRAA >60 >60 >60  ANIONGAP '9 7 9     '$ Hematology Recent Labs  Lab 10/19/17 2325 10/21/17 0516  WBC 6.4 5.1  RBC 4.64 4.52  HGB 11.2* 10.8*  HCT 35.7* 35.0*  MCV 76.9* 77.4*  MCH 24.1* 23.9*  MCHC 31.4 30.9  RDW 19.9* 20.1*  PLT 457* 442*    Cardiac Enzymes Recent Labs  Lab 10/20/17 0847 10/20/17 1231 10/20/17  1848  TROPONINI <0.03 0.03* 0.03*    Recent Labs  Lab 10/19/17 2331  TROPIPOC 0.04     BNP Recent Labs  Lab 10/20/17 0419 10/20/17 1848  BNP 253.5* 135.4*     DDimer No results for input(s): DDIMER in the last 168 hours.   Radiology    No results found.  Cardiac Studies    Echocardiogram 10/20/17: Study Conclusions  - Left ventricle: The cavity size was normal. There was mild concentric hypertrophy. Systolic function was normal. The estimated ejection fraction was in the range of 55% to 60%. Wall motion was normal; there were no regional wall motion abnormalities. Left ventricular diastolic function parameters were normal. - Aortic valve: Trileaflet; mildly thickened, mildly calcified leaflets. - Mitral valve: Calcified annulus. Mildly thickened leaflets . There was trivial regurgitation. - Right ventricle: The cavity size was mildly dilated. Wall thickness was normal. Systolic function was mildly reduced. - Right atrium: The atrium was normal in size. - Pulmonic valve: There was mild regurgitation. - Pulmonary arteries:  Systolic pressure was at the upper limits of normal. - Inferior vena cava: The vessel was dilated. The respirophasic diameter changes were blunted (<50%), consistent with elevated central venous pressure. - Pericardium, extracardiac: There wassmall pericardial effusion    Patient Profile   67 y.o. female with a PMH of HTN, HLD, DM type 2, obesity, OSA on CPAP, and hypothyroidism, who presented with SOB. Cardiology following for pericardial effusion and possible diastolic CHF.   Assessment & Plan    1. Pericardial effusion: small pericardial effusion noted on echo 10/20/17. No evidence of tamponade. Possible there is an inflammatory component as ANA positive and ESR elevated. Also with elevated BNP - effusion may reflect some diastolic dysfunction. Started on colchicine and ibuprofen for possible pericarditis and IV lasix for diuresis.  - Continue colchicine and ibuprofen - Continue IV lasix daily  2. DOE: primary complaint from patient at the time of admission which has been progressively worsening over the past 4 months. CT Chest without PE but did have ground glass opacities c/f pulmonary edema vs infection vs inflammation; also noted to have small-mod pericardial effusion and aortic atherosclerosis and coronary artery calcifications. Trop trend flat at 0.03 x2. EKG with sinus tachycardia with occasional PAC, no STE/D, no TWI. Last ischemic evaluation was a NST 09/2016 without ischemia or infarction. BNP trended down 253>135 with IV lasix '40mg'$ . UOP net -1L in the last 24 hours, -2.2L this admission. Weight stable at 285lbs. Procal negative and antibiotics discontinued. Pulmonary team evaluated yesterday and recommended ANA titers and possible open lung biopsy if no improvement. Still with some crackles at lung bases on exam today - Continue IV lasix '40mg'$  daily - Continue antibiotics per primary team - Coronary CTA pending to evaluate coronary anatomy  3. Chest pain: atypical chest  pain which occurs primarily at rest but is occasionally exacerbated by bending forward. Described as a pressure sensation which resolves spontaneously after 20-25 minutes. Trop trend 0.03 x2, not consistent with ACS. EKG without ischemic changes. Recent NST 09/2016 without ischemia - Coronary CTA pending to evaluate coronary anatomy  4. HTN: BP persistently elevated this admission on home diltiazem '360mg'$  and ARB alternative. Started on metoprolol 12.'5mg'$  BID yesterday.  - Continue current regimen.      For questions or updates, please contact [place] Please consult [email or web address] for contact info under Cardiology/STEMI.      Signed, Abigail Butts, PA-C  10/22/2017, 10:45 AM   [phone]

## 2017-10-22 NOTE — Interval H&P Note (Signed)
Cath Lab Visit (complete for each Cath Lab visit)  Clinical Evaluation Leading to the Procedure:   ACS: Yes.    Non-ACS:    Anginal Classification: CCS IV  Anti-ischemic medical therapy: Minimal Therapy (1 class of medications)  Non-Invasive Test Results: No non-invasive testing performed  Prior CABG: No previous CABG  Unable to slow her HR down for cardiac CT.    History and Physical Interval Note:  10/22/2017 3:50 PM  Frederich Chick  has presented today for surgery, with the diagnosis of cp  The various methods of treatment have been discussed with the patient and family. After consideration of risks, benefits and other options for treatment, the patient has consented to  Procedure(s): LEFT HEART CATH AND CORONARY ANGIOGRAPHY (N/A) as a surgical intervention .  The patient's history has been reviewed, patient examined, no change in status, stable for surgery.  I have reviewed the patient's chart and labs.  Questions were answered to the patient's satisfaction.     Larae Grooms

## 2017-10-22 NOTE — Progress Notes (Signed)
   Name: [name] MRN: [id number] DOB: 1951/03/23    ADMISSION DATE:  10/19/2017 CONSULTATION DATE:  6/12  REFERRING MD :  Larose Kells  CHIEF COMPLAINT:  Hypoxia   BRIEF PATIENT DESCRIPTION: 68yo female with hx chronic respiratory failure, asthma, HTN, OSA, DM presented 6/10 with SOB.  She wears 3L [place] O2 at baseline.  ER w/u included CTA chest which was neg for PE but showed diffuse ground glass opacities. She was admitted by [facility] with acute on chronic respiratory failure r/t CHF v infection v inflammatory process.  PCCM consulted to assist with diff dx of CT changes.   SIGNIFICANT EVENTS    STUDIES:  CTA chest 6/11>>>1. No pulmonary embolus. 2. Multifocal ground-glass opacities, most prominent in the lung bases. Findings may represent pulmonary edema, infectious or inflammatory etiologies, or developing ARDS. 3. Cardiomegaly with small to moderate pericardial effusion. 4. Aortic Atherosclerosis (ICD10-I70.0). Coronary artery calcifications. 2D echo 6/11>>> EF 55-60%, trivial MR, mild pericardial effusion without evidence of tamponade    SUBJECTIVE:  No distress.  She says she feels much better.  Dyspnea returning to baseline.  No further chest pain VITAL SIGNS: Temp:  [98.2 F (36.8 C)] 98.2 F (36.8 C) (06/13 0407) Pulse Rate:  [87-102] 88 (06/13 1052) Resp:  [18-20] 18 (06/13 0800) BP: (126-156)/(79-98) 144/96 (06/13 1052) SpO2:  [97 %-100 %] 99 % (06/13 1052) Weight:  [285 lb 0.9 oz (129.3 kg)] 285 lb 0.9 oz (129.3 kg) (06/13 0407)  PHYSICAL EXAMINATION: General: 67 year old female patient currently resting comfortably in bed she is in no acute distress. HEENT: Normocephalic atraumatic no jugular venous distention Pulmonary: Crackles posterior bases no accessory use Cardiac: Regular rate and rhythm Abdomen: Soft nontender Extremities: No significant edema, brisk capillary refill, warm, dry. Neuro: Awake oriented no focal deficits  Recent Labs  Lab 10/20/17 0847  10/21/17 0516 10/22/17 0645  NA 139 139 141  K 4.0 4.2 4.4  CL 103 105 103  CO2 '27 27 29  '$ BUN '11 13 14  '$ CREATININE 1.00 1.03* 0.95  GLUCOSE 135* 160* 142*   Recent Labs  Lab 10/19/17 2325 10/21/17 0516  HGB 11.2* 10.8*  HCT 35.7* 35.0*  WBC 6.4 5.1  PLT 457* 442*   No results found.  Autoimmune: ESR 88 ANA - POS  (BARELY pos in 2008 - ??actual value this time) Anti-Jo - wnl  dsDNA - wnl HIV - NR  CRP - POS (26.9)    ASSESSMENT / PLAN:  Acute on chronic hypoxic respiratory failure with diffuse GGO.  Edema v autoimmune. POS ANA (although pos in 2008 but only marginally so), CRP POS and elevated ESR. Less likely infectious given clinical picture.  No honeycombing or other CT changes to suggest ILD at this time.  Most likely r/t edema.   Interval from 6/12 to 6/13: Now -2.6 L, at 3 L nasal cannula currently.  I favor pulmonary edema given clinical response to diuresis  PLAN -  Continue IV diuresis Follow-up ANA titer Supplemental oxygen Given clinical response No current indication for open lung biopsy We will repeat a chest x-ray today, I expect this will be clearing.  We will evaluate this and likely sign off.  We will be available as needed

## 2017-10-23 ENCOUNTER — Encounter (HOSPITAL_COMMUNITY): Payer: Self-pay | Admitting: Interventional Cardiology

## 2017-10-23 LAB — GLUCOSE, CAPILLARY
Glucose-Capillary: 130 mg/dL — ABNORMAL HIGH (ref 65–99)
Glucose-Capillary: 189 mg/dL — ABNORMAL HIGH (ref 65–99)
Glucose-Capillary: 151 mg/dL — ABNORMAL HIGH (ref 65–99)
Glucose-Capillary: 190 mg/dL — ABNORMAL HIGH (ref 65–99)

## 2017-10-23 LAB — CBC
HEMATOCRIT: 36.2 % (ref 36.0–46.0)
Hemoglobin: 11.1 g/dL — ABNORMAL LOW (ref 12.0–15.0)
MCH: 24 pg — AB (ref 26.0–34.0)
MCHC: 30.7 g/dL (ref 30.0–36.0)
MCV: 78.2 fL (ref 78.0–100.0)
Platelets: 476 10*3/uL — ABNORMAL HIGH (ref 150–400)
RBC: 4.63 MIL/uL (ref 3.87–5.11)
RDW: 19.9 % — AB (ref 11.5–15.5)
WBC: 3.8 10*3/uL — ABNORMAL LOW (ref 4.0–10.5)

## 2017-10-23 LAB — BASIC METABOLIC PANEL
ANION GAP: 8 (ref 5–15)
BUN: 15 mg/dL (ref 6–20)
CALCIUM: 8.7 mg/dL — AB (ref 8.9–10.3)
CO2: 26 mmol/L (ref 22–32)
Chloride: 105 mmol/L (ref 101–111)
Creatinine, Ser: 1.03 mg/dL — ABNORMAL HIGH (ref 0.44–1.00)
GFR calc Af Amer: >60 mL/min (ref >60)
GFR, EST NON AFRICAN AMERICAN: 55 mL/min — AB (ref >60)
GLUCOSE: 141 mg/dL — AB (ref 65–99)
POTASSIUM: 3.9 mmol/L (ref 3.5–5.1)
Sodium: 139 mmol/L (ref 135–145)

## 2017-10-23 LAB — ANA: ANA: POSITIVE — AB

## 2017-10-23 MED FILL — Heparin Sod (Porcine)-NaCl IV Soln 1000 Unit/500ML-0.9%: INTRAVENOUS | Qty: 1000 | Status: AC

## 2017-10-23 NOTE — Progress Notes (Signed)
Name: [name] MRN: [id number] DOB: 05-Jun-1950    ADMISSION DATE:  10/19/2017 CONSULTATION DATE:  6/12  REFERRING MD :  Larose Kells  CHIEF COMPLAINT:  Hypoxia   BRIEF PATIENT DESCRIPTION: 67yo female with hx chronic respiratory failure, asthma, HTN, OSA, DM presented 6/10 with SOB.  She wears 3L [place] O2 at baseline.  ER w/u included CTA chest which was neg for PE but showed diffuse ground glass opacities. She was admitted by [facility] with acute on chronic respiratory failure r/t CHF v infection v inflammatory process.  PCCM consulted to assist with diff dx of CT changes.   SIGNIFICANT EVENTS    STUDIES:  CTA chest 6/11>>>1. No pulmonary embolus. 2. Multifocal ground-glass opacities, most prominent in the lung bases. Findings may represent pulmonary edema, infectious or inflammatory etiologies, or developing ARDS. 3. Cardiomegaly with small to moderate pericardial effusion. 4. Aortic Atherosclerosis (ICD10-I70.0). Coronary artery calcifications. 2D echo 6/11>>> EF 55-60%, trivial MR, mild pericardial effusion without evidence of tamponade    SUBJECTIVE:  No cardiac cath results, normal LV function and evidence for adequate diuresis with low LVEDP She feels back to to her usual baseline   VITAL SIGNS: Temp:  [97.6 F (36.4 C)-98.3 F (36.8 C)] 98.3 F (36.8 C) (06/14 0533) Pulse Rate:  [73-89] 87 (06/14 1449) Resp:  [16-43] 16 (06/14 0533) BP: (85-154)/(56-98) 131/75 (06/14 1449) SpO2:  [94 %-98 %] 96 % (06/14 0819) Weight:  [126.7 kg (279 lb 4.8 oz)] 126.7 kg (279 lb 4.8 oz) (06/14 0546)  PHYSICAL EXAMINATION: General: 67 year old female patient currently resting comfortably in bed she is in no acute distress. HEENT: Normocephalic atraumatic no jugular venous distention Pulmonary: Crackles posterior bases no accessory use Cardiac: Regular rate and rhythm Abdomen: Soft nontender Extremities: No significant edema, brisk capillary refill, warm, dry. Neuro: Awake oriented  no focal deficits  Recent Labs  Lab 10/21/17 0516 10/22/17 0645 10/23/17 0552  NA 139 141 139  K 4.2 4.4 3.9  CL 105 103 105  CO2 '27 29 26  '$ BUN '13 14 15  '$ CREATININE 1.03* 0.95 1.03*  GLUCOSE 160* 142* 141*   Recent Labs  Lab 10/19/17 2325 10/21/17 0516 10/23/17 0552  HGB 11.2* 10.8* 11.1*  HCT 35.7* 35.0* 36.2  WBC 6.4 5.1 3.8*  PLT 457* 442* 476*   Dg Chest Port 1 View  Result Date: 10/22/2017 CLINICAL DATA:  Pulmonary edema EXAM: PORTABLE CHEST 1 VIEW COMPARISON:  10/19/2017 FINDINGS: Cardiomegaly with vascular congestion. Increasing perihilar and bibasilar opacities could reflect atelectasis or edema. No visible significant effusions or acute bony abnormality. IMPRESSION: Cardiomegaly with vascular congestion. Increasing perihilar and lower lobe atelectasis and/or edema. Electronically Signed   By: Rolm Baptise M.D.   On: 10/22/2017 12:17    Autoimmune: ESR 88 ANA - POS  (BARELY pos in 2008 - ??actual value this time) Anti-Jo - wnl  dsDNA - wnl HIV - NR  CRP - POS (26.9)    ASSESSMENT / PLAN:  67 year old obese woman with history of hypertension, diabetes, obstructive sleep apnea and chronic hypoxemic respiratory failure for which she uses 3 L/min by nasal cannula.  Also with a reported history of asthma, but she has been unable to do pulmonary function testing (attempted in 2017).    She has bilateral groundglass base predominant infiltrates on CT chest, has clinically improved with diuresis.  Cardiac catheterization 6/14 reassuring, no evidence of coronary disease.  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    Recommend  Decrease her diuresis as mentioned by cardiology She likely needs a referral for rheumatology as an outpatient Defer steroids for now, unclear what we would be treating I have made her an appointment to see [place] as an outpatient and about 1 month.  At that point she will need repeat imaging.  If her  infiltrates persist then I believe she will need more evaluation for possible immune inflammatory disease as a cause for her groundglass infiltrates, probably BAL and if unrevealing VATS biopsy.   Baltazar Apo, MD, PhD 10/23/2017, 3:19 PM [facility] [phone] or if no answer [phone]

## 2017-10-23 NOTE — Care Management Important Message (Signed)
Important Message  Patient Details  Name: [name] MRN: [id number] Date of Birth: August 04, 1950   Medicare Important Message Given:  Yes    Alysa Duca P [place] 10/23/2017, 3:41 PM

## 2017-10-23 NOTE — Progress Notes (Signed)
Received shift report from Nurse "Zigmund Daniel".  Patient is alert and oriented x 4 and denies pain.  Right Radial cath site from yesterday has gauze and tegaderm dressing clean, dry, and intact - site = level 0.  My phone number added to patient white board and patient instructed to call for any needs and to expect intentional rounding from me and the NT.

## 2017-10-23 NOTE — Progress Notes (Signed)
PROGRESS NOTE  [name]  UUE:[id number] DOB: May 27, 1950 DOA: 10/19/2017 PCP: Colon Branch, MD   Brief Narrative: [name] is a a.ge female with a history of obesity, OSA, asthma, chronic hypoxic respiratory failure, HTN, HLD, IDDM and hypothyroidism who presented to the ED with acute worsening of shortness of breath at rest. She was hypoxic with nonspecific CXR findings. CTA chest showed no PE but demonstrated diffuse dependent GGOs and echocardiogram revealed a pericardial effusion without features of tamponade. Cardiology was consulted and the patient was admitted, started on diuresis. ESR was elevated at 88, ANA noted to be "positive." Cardiac catheterization did not reveal significant coronary artery disease so ibuprofen and colchicine were ordered for pericarditis. Lasix had been given but LVEDP was low at cath, so this was also stopped. Pulmonology was consulted and recommended follow up in their clinic in 1 month with repeat imaging. If abnormalities persist, would need further work up with BAL and if unrevealing, VATS biopsy.   Assessment & Plan: Principal Problem:   Acute respiratory failure with hypoxia (HCC) Active Problems:   DM II (diabetes mellitus, type II), controlled ([place])   Hyperlipidemia   Obstructive sleep apnea   Essential hypertension   GERD (gastroesophageal reflux disease)   Morbid obesity (HCC)   Asthma, chronic   Shortness of breath   Pericardial effusion   Acute pulmonary edema (HCC)  Acute on chronic hypoxic respiratory failure: With multifocal nonspecific GGOs, mild pericardial effusion. BNP 135 (falsely low due to obesity?), troponin not elevated. Preserved EF and diastolic function on echo, mildly reduced RV function with dilated IVC. Doubt infectious etiology with undetectable procalcitonin, continue off abx.  - ESR grossly elevated to 88 here, so suspect an inflammatory process involved. ANA was 1:40 in 2008, repeat on 10/19/2017 was "positive"  without reported titer. Anti-Jo, dsDNA normal, HIV NR.  - Needs rheumatology follow up.  - Pulmonology consulted, plan outpatient follow up with repeat imaging and work up including BAL if infiltrates persist.  Acute on chronic diastolic CHF: LVEDP low on cath, so diuresis stopped.   IDT2DM: Held insulin due to hypoglycemia, now CBGs rising. On humalog 50/50 45-48u BID but wasn't taking it like this due to symptomatic hypoglycemia. HbA1c 5.6% indicating too tight of control. - SSI AC/HS, at inpatient goal.  - Plan to tailor therapy going forward with anticipated DC on reduced dose humalog BID.  Hypothyroidism: Pt reports somewhat recently increasing synthroid dose. TSH 7.175.  - Synthroid dose increased back to outpatient dose of 556mg. - Will need PCP follow up and recheck in 4-6 weeks.  Asthma: No evidence of exacerbation - Continue dulera (for symbicort), prn nebs  Pericarditis: With chest pain worse when leaning forward and laying supine. Tn's 0.03.  - Cardiology started ibuprofen 6042mTID x2 weeks and colchicine 0.56m82mo BID x3 months. Pain improving.   Chest pain:  - No significant CAD noted on catheterization.  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  - Monitor  Chronic anemia:  - Monitor  DVT prophylaxis: SCDs Code Status: Full Family Communication: None at bedside Disposition Plan: Likely home if breathing, chest pain, and creatinine stable in AM.  Consultants:   Cardiology  Pulmonology  Procedures:   None  Antimicrobials:  Azithromycin, CTX 6/10-6/12.   Subjective: Breathing improved from yesterday. Not getting up much though.  Chest pain improved.   Objective: Vitals:   10/23/17 0533 10/23/17 0546 10/23/17 0819 10/23/17 1449  BP: 126/75   131/75  Pulse: 81   87  Resp: 16     Temp: 98.3 F (36.8 C)     TempSrc:      SpO2: 96%  96%    Weight:  126.7 kg (279 lb 4.8 oz)    Height:  _0  (1.753 m)      Intake/Output Summary (Last 24 hours) at 10/23/2017 1836 Last data filed at 10/23/2017 1800 Gross per 24 hour  Intake 723 ml  Output 1903 ml  Net -1180 ml   Filed Weights   10/22/17 0407 10/22/17 1414 10/23/17 0546  Weight: 129.3 kg (285 lb 0.9 oz) 130.9 kg (288 lb 9.3 oz) 126.7 kg (279 lb 4.8 oz)   Gen: 67 y.o. female in no distress. Calm and obese.  Pulm: Nonlabored breathing 3LPM. Crackles have improved, no wheezing. CV: Regular rate and rhythm. No murmur, rub, or gallop. No JVD, no dependent edema. GI: Abdomen soft, non-tender, non-distended, with normoactive bowel sounds.  Ext: Warm, no deformities Skin: No rashes, lesions or ulcers on visualized skin. Marland Kitchen  Neuro: Alert and oriented. No focal neurological deficits. Psych: Judgement and insight appear fair. Mood euthymic & affect congruent. Behavior is appropriate.    Data Reviewed: I have personally reviewed following labs and imaging studies  CBC: Recent Labs  Lab 10/19/17 2325 10/21/17 0516 10/23/17 0552  WBC 6.4 5.1 3.8*  HGB 11.2* 10.8* 11.1*  HCT 35.7* 35.0* 36.2  MCV 76.9* 77.4* 78.2  PLT 457* 442* 943*   Basic Metabolic Panel: Recent Labs  Lab 10/19/17 2325 10/20/17 0847 10/21/17 0516 10/22/17 0645 10/23/17 0552  NA 139 139 139 141 139  K 3.1* 4.0 4.2 4.4 3.9  CL 105 103 105 103 105  CO2 _1 GLUCOSE 39* 135* 160* 142* 141*  BUN _2 CREATININE 1.08* 1.00 1.03* 0.95 1.03*  CALCIUM 8.6* 8.4* 8.4* 8.9 8.7*  MG  --  1.9  --   --   --    GFR: Estimated Creatinine Clearance: 74.6 mL/min (A) (by C-G formula based on SCr of 1.03 mg/dL (H)). Liver Function Tests: Recent Labs  Lab 10/20/17 0847  AST 25  ALT 16  ALKPHOS 49  BILITOT 0.6  PROT 7.4  ALBUMIN 2.6*   No results for input(s): LIPASE, AMYLASE in the last 168 hours. No results for input(s): AMMONIA in the last 168 hours. Coagulation Profile: Recent  Labs  Lab 10/22/17 1513  INR 1.12   Cardiac Enzymes: Recent Labs  Lab 10/19/17 1426 10/20/17 0847 10/20/17 1231 10/20/17 1848  CKTOTAL 153  --   --   --   TROPONINI  --  <0.03 0.03* 0.03*   BNP (last 3 results) No results for input(s): PROBNP in the last 8760 hours. HbA1C: No results for input(s): HGBA1C in the last 72 hours. CBG: Recent Labs  Lab 10/22/17 1649 10/22/17 2138 10/23/17 0759 10/23/17 1220 10/23/17 1641  GLUCAP 138* 159* 130* 189* 151*   Lipid Profile: No results for input(s): CHOL, HDL, LDLCALC, TRIG, CHOLHDL, LDLDIRECT in the last 72 hours. Thyroid Function Tests: No results for input(s): TSH, T4TOTAL, FREET4, T3FREE, THYROIDAB in the last 72 hours. Anemia Panel: No results for input(s): VITAMINB12, FOLATE, FERRITIN, TIBC, IRON, RETICCTPCT in the last 72 hours. Urine analysis:    Component Value Date/Time   COLORURINE YELLOW 11/30/2013 1057  APPEARANCEUR CLEAR 11/30/2013 1057   LABSPEC 1.010 11/30/2013 1057   PHURINE 5.5 11/30/2013 1057   GLUCOSEU NEGATIVE 11/30/2013 1057   HGBUR NEGATIVE 11/30/2013 1057   BILIRUBINUR NEGATIVE 11/30/2013 1057   KETONESUR NEGATIVE 11/30/2013 1057   PROTEINUR NEGATIVE 12/31/2011 1029   UROBILINOGEN 0.2 11/30/2013 1057   NITRITE NEGATIVE 11/30/2013 1057   LEUKOCYTESUR TRACE (A) 11/30/2013 1057   Recent Results (from the past 240 hour(s))  Blood culture (routine x 2)     Status: None (Preliminary result)   Collection Time: 10/20/17  4:06 AM  Result Value Ref Range Status   Specimen Description BLOOD LEFT FOREARM  Final   Special Requests   Final    BOTTLES DRAWN AEROBIC AND ANAEROBIC Blood Culture adequate volume   Culture   Final    NO GROWTH 3 DAYS Performed at [facility], [place] [address], [place], [place] [address]    Report Status PENDING  Incomplete  Blood culture (routine x 2)     Status: None (Preliminary result)   Collection Time: 10/20/17  4:19 AM  Result Value Ref Range Status   Specimen  Description BLOOD RIGHT ANTECUBITAL  Final   Special Requests   Final    BOTTLES DRAWN AEROBIC AND ANAEROBIC Blood Culture adequate volume   Culture   Final    NO GROWTH 3 DAYS Performed at [facility], [place] [address], [place], [place] [address]    Report Status PENDING  Incomplete      Radiology Studies: Dg Chest Port 1 View  Result Date: 10/22/2017 CLINICAL DATA:  Pulmonary edema EXAM: PORTABLE CHEST 1 VIEW COMPARISON:  10/19/2017 FINDINGS: Cardiomegaly with vascular congestion. Increasing perihilar and bibasilar opacities could reflect atelectasis or edema. No visible significant effusions or acute bony abnormality. IMPRESSION: Cardiomegaly with vascular congestion. Increasing perihilar and lower lobe atelectasis and/or edema. Electronically Signed   By: Rolm Baptise M.D.   On: 10/22/2017 12:17    Scheduled Meds: . colchicine  0.6 mg Oral BID  . diltiazem  360 mg Oral Daily  . ibuprofen  600 mg Oral TID  . insulin aspart  0-15 Units Subcutaneous TID WC  . insulin aspart  0-5 Units Subcutaneous QHS  . irbesartan  150 mg Oral Daily  . latanoprost  1 drop Both Eyes QHS  . levothyroxine  75 mcg Oral QAC breakfast  . mouth rinse  15 mL Mouth Rinse BID  . metoprolol tartrate  25 mg Oral BID  . mometasone-formoterol  2 puff Inhalation BID  . pantoprazole  40 mg Oral Q0600  . rosuvastatin  40 mg Oral Daily  . sodium chloride flush  3 mL Intravenous Q12H   Continuous Infusions: . sodium chloride       LOS: 3 days   Time spent: 25 minutes.  Patrecia Pour, MD [facility] [email or web address] Password [facility] 10/23/2017, 6:36 PM

## 2017-10-24 LAB — GLUCOSE, CAPILLARY: Glucose-Capillary: 148 mg/dL — ABNORMAL HIGH (ref 65–99)

## 2017-10-24 MED ORDER — COLCHICINE 0.6 MG PO TABS: 0.6000 mg | ORAL_TABLET | Freq: Two times a day (BID) | ORAL | 2 refills | Status: DC

## 2017-10-24 MED ORDER — LEVOTHYROXINE SODIUM 75 MCG PO TABS: 75.0000 ug | ORAL_TABLET | Freq: Every day | ORAL | 0 refills | Status: DC

## 2017-10-24 MED ORDER — IBUPROFEN 600 MG PO TABS: 600.0000 mg | ORAL_TABLET | Freq: Three times a day (TID) | ORAL | 0 refills | Status: DC

## 2017-10-24 MED ORDER — METOPROLOL TARTRATE 25 MG PO TABS: 25.0000 mg | ORAL_TABLET | Freq: Two times a day (BID) | ORAL | 0 refills | Status: DC

## 2017-10-24 MED ORDER — INSULIN LISPRO PROT & LISPRO (50-50 MIX) 100 UNIT/ML KWIKPEN: 5.0000 [IU] | PEN_INJECTOR | Freq: Two times a day (BID) | SUBCUTANEOUS | Status: DC

## 2017-10-24 NOTE — Discharge Summary (Signed)
Physician Discharge Summary  [name] UTM:[id number] DOB: [birth year] DOA: 10/19/2017  PCP: Colon Branch, MD  Admit date: 10/19/2017 Discharge date: 10/24/2017  Admitted From: Home Disposition: Home   Recommendations for Outpatient Follow-up:  1. Follow up with PCP in 1-2 weeks, pt needs rheumatology referral.  2. Follow up with cardiology. Placed on colchicine and motrin for pericarditis. 3. Follow up with [facility] pulmonology in 1 month with repeat imaging to guide further work up of GGOs.  4. Monitor BMP, CBC, and recommend recheck TSH in 4-6 weeks. Discharged on synthroid 48mg. 5. Follow up on diabetes management. Based on low A1c, pt reported hypogylcemia at home, and low insulin requirements while admitted her humalog dose was significantly decreased. This will need close monitoring.   Home Health: None new Equipment/Devices: None new Discharge Condition: Stable CODE STATUS: Full Diet recommendation: Heart healthy, carb-modified  Brief/Interim Summary: Melissa DEANEis a 67y.o. female with a history of obesity, OSA, asthma, chronic hypoxic respiratory failure, HTN, HLD, IDDM and hypothyroidism who presented to the ED with acute worsening of shortness of breath at rest. She was hypoxic with nonspecific CXR findings. CTA chest showed no PE but demonstrated diffuse dependent GGOs and echocardiogram revealed a pericardial effusion without features of tamponade. Cardiology was consulted and the patient was admitted, started on diuresis. Echocardiogram showed normal EF and pericardial effusion. ESR was elevated at 88, ANA noted to be "positive." Cardiac catheterization did not reveal significant coronary artery disease so ibuprofen and colchicine were ordered for pericarditis. Lasix had been given but LVEDP was low at cath, so this was also stopped. Respiratory status has improved significantly, no chest pain. Pulmonology was consulted and recommended follow up in their clinic in 1  month with repeat imaging. If abnormalities persist, would need further work up with BAL and if unrevealing, VATS biopsy.   Discharge Diagnoses:  Principal Problem:   Acute respiratory failure with hypoxia (HCC) Active Problems:   DM II (diabetes mellitus, type II), [place]   Hyperlipidemia   Obstructive sleep apnea   Essential hypertension   GERD (gastroesophageal reflux disease)   Morbid obesity (HCC)   Asthma, chronic   Shortness of breath   Pericardial effusion   Acute pulmonary edema (HCC)  Acute on chronic hypoxic respiratory failure: With multifocal nonspecific GGOs, mild pericardial effusion. BNP 135 (falsely low due to obesity?), troponin not elevated. Preserved EF and diastolic function on echo, mildly reduced RV function with dilated IVC. Doubt infectious etiology with undetectable procalcitonin, continue off abx.  - Respiratory status has returned to baseline, urged to adhere to continuous oxygen. - ESR grossly elevated to 88 here, so suspect an inflammatory process involved. ANA was 1:40 in 2008, repeat on 10/19/2017 was "positive" without reported titer. Anti-Jo, dsDNA normal, HIV NR.  - Needs rheumatology follow up.  - Pulmonology consulted, plan outpatient follow up with repeat imaging and work up including BAL if infiltrates persist.  Acute on chronic diastolic CHF: LVEDP low on cath, so diuresis stopped.   Pericarditis: With chest pain worse when leaning forward and laying supine. Tn's 0.03.  - Cardiology started ibuprofen 6032mTID x2 weeks and colchicine 0.63m48mo BID x3 months. Pain improving.   IDT2DM: Held insulin due to hypoglycemia, now CBGs rising. On humalog 50/50 45-48u BID but wasn't taking it like this due to symptomatic hypoglycemia. HbA1c 5.6% indicating too tight of control. - SSI AC/HS, at inpatient goal when taking < 10u/day. Also suspect improved diet from home. -  DC on reduced dose humalog 5u BID.  Hypothyroidism: Pt reports somewhat  recently increasing synthroid dose (though pt is not clear on many of her medications frequency, dose, indication, etc.). TSH 7.175.  - Synthroid dose increased back to outpatient dose of 59mg. - Will need PCP follow up and recheck in 4-6 weeks.  Asthma: No evidence of exacerbation - Continue dulera (for symbicort), prn nebs  Chest pain:  - No significant CAD noted on catheterization.  OSA:  - Continue CPAP  HTN:  - Continue dilt and ARB, added low dose metoprolol.   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  - Monitor  Chronic anemia:  - Monitor  Discharge Instructions Discharge Instructions    Diet - low sodium heart healthy   Complete by:  As directed    Diet Carb Modified   Complete by:  As directed    Discharge instructions   Complete by:  As directed    You were admitted with trouble breathing and chest pain, found to have a fluid collection around your heart that is small but may be contributing to symptoms. There was no coronary artery disease found on catheterization and you do not require any more medications to improve your fluid status. You are stable for discharge with the following recommendations:  - Start taking colchicine twice a day for 3 months and ibuprofen three times a day for 2 weeks to treat pericarditis (fluid around heart) - Start taking metoprolol twice a day to help lower your blood pressure. - Continue taking synthroid 759m daily - Decrease your insulin at home. Take only 5 units twice daily with breakfast and dinner. Check your blood sugars in the morning and at meals, write these numbers down, and take them to your PCP at your next appointment.  - Schedule an appointment with Dr. PaLarose Kellss soon as possible, call on Monday. You will need to be referred to a rheumatologist as there were some abnormal but not specific findings on your lungs and abnormal labs that may indicate an auto-immune disease process. This is  still not clear, so you need to see the rheumatologist for further work up.  - You have an appointment scheduled with the lung doctor that is very important to make it to.  - If your symptoms worsen, seek medical attention right away.   Increase activity slowly   Complete by:  As directed      Allergies as of 10/24/2017   No Known Allergies     Medication List    TAKE these medications   ACCU-CHEK AVIVA PLUS test strip Generic drug:  glucose blood TEST BLOD SUGAR TWICE DAILY AND LANCETS TWICE DAILY   ACCU-CHEK SOFTCLIX LANCETS lancets USE TO CHECK BLOOD SUGAR TWICE A DAY   albuterol 108 (90 Base) MCG/ACT inhaler Commonly known as:  PROVENTIL HFA;VENTOLIN HFA Inhale 2 puffs into the lungs every 4 (four) hours as needed for wheezing.   aspirin EC 81 MG tablet Take 81 mg by mouth daily.   budesonide-formoterol 160-4.5 MCG/ACT inhaler Commonly known as:  SYMBICORT Inhale 2 puffs into the lungs 2 (two) times daily. What changed:    when to take this  reasons to take this   colchicine 0.6 MG tablet Take 1 tablet (0.6 mg total) by mouth 2 (two) times daily.   diltiazem 360 MG 24 hr capsule Commonly known as:  CARDIZEM CD Take 1 capsule (360 mg total) by mouth daily.  ibuprofen 600 MG tablet Commonly known as:  ADVIL,MOTRIN Take 1 tablet (600 mg total) by mouth 3 (three) times daily. for 2 weeks   Insulin Lispro Prot & Lispro (50-50) 100 UNIT/ML Kwikpen Commonly known as:  HUMALOG MIX 50/50 KWIKPEN Inject 5 Units into the skin 2 (two) times daily with a meal. And pen needles 2/day What changed:  how much to take   latanoprost 0.005 % ophthalmic solution Commonly known as:  XALATAN USE 1 DROP IN BOTH EYES AT BEDTIME   levothyroxine 75 MCG tablet Commonly known as:  SYNTHROID, LEVOTHROID Take 1 tablet (75 mcg total) by mouth daily before breakfast. What changed:    medication strength  how much to take   metoprolol tartrate 25 MG tablet Commonly known as:   LOPRESSOR Take 1 tablet (25 mg total) by mouth 2 (two) times daily.   OXYGEN Inhale 3 L into the lungs continuous.   potassium chloride 10 MEQ tablet Commonly known as:  K-DUR,KLOR-CON TAKE 1 TABLET(10 MEQ) BY MOUTH DAILY   rosuvastatin 40 MG tablet Commonly known as:  CRESTOR Take 1 tablet (40 mg total) by mouth daily.   telmisartan 40 MG tablet Commonly known as:  MICARDIS Take 1 tablet (40 mg total) by mouth daily.      Follow-up Information    Rigoberto Noel, MD Follow up on 11/25/2017.   Specialty:  Pulmonary Disease Why:  11:30 am. arrive 15 minutes early (11:15) Contact information: [address] [address] [phone]        Colon Branch, MD. Schedule an appointment as soon as possible for a visit in 1 week(s).   Specialty:  Internal Medicine Contact information: [address] [place] [address] [phone]        Sueanne Margarita, MD .   Specialty:  Cardiology Contact information: [address] [place] [address] [phone]          No Known Allergies  Consultations:  Cardiology  Pulmonology  Procedures/Studies: Dg Chest 2 View  Result Date: 10/20/2017 CLINICAL DATA:  Chest pain and shortness of breath EXAM: CHEST - 2 VIEW COMPARISON:  Sep 11, 2017 FINDINGS: Cardiomegaly. Mild edema. Mild patchy opacity in the lateral left lung base is more prominent the interval. No other interval changes. IMPRESSION: 1. Cardiomegaly and mild edema. 2. Mild patchy opacity in the lateral left lung base. Recommend follow-up to resolution. Electronically Signed   By: Dorise Bullion III M.D   On: 10/20/2017 00:03   Ct Angio Chest Pe W And/or Wo Contrast  Result Date: 10/20/2017 CLINICAL DATA:  Shortness of breath.  Chest pain. EXAM: CT ANGIOGRAPHY CHEST WITH CONTRAST TECHNIQUE: Multidetector CT imaging of the chest was performed using the standard protocol during bolus administration of intravenous contrast.  Multiplanar CT image reconstructions and MIPs were obtained to evaluate the vascular anatomy. CONTRAST:  76m ISOVUE-370 IOPAMIDOL (ISOVUE-370) INJECTION 76% COMPARISON:  Chest radiograph yesterday. FINDINGS: Cardiovascular: There are no filling defects within the pulmonary arteries to suggest pulmonary embolus. Multi chamber cardiomegaly. Thoracic aorta is normal in caliber without dissection. Mild aortic atherosclerosis. Small to moderate pericardial effusion measures up to 2 cm in depth. This is slightly complex. There are coronary artery calcifications. Mediastinum/Nodes: Small mediastinal and hilar lymph nodes not enlarged by size criteria. Mild enlargement of the right thyroid gland without dominant nodule. The esophagus is decompressed. Small hiatal hernia. Lungs/Pleura: Ground-glass opacities in the dependent upper lobes, diffusely throughout the  right and left lower lobe and to a lesser extent right middle lobe. Findings are most prominent in a dependent distribution. No septal thickening. No pleural effusion. Trachea and mainstem bronchi are patent. Upper Abdomen: No acute findings. Musculoskeletal: Multilevel degenerative change in the spine. There are no acute or suspicious osseous abnormalities. Sclerosis about posterior right fourth rib at the chondro vertebral junction likely secondary to remote prior fracture. Review of the MIP images confirms the above findings. IMPRESSION: 1. No pulmonary embolus. 2. Multifocal ground-glass opacities, most prominent in the lung bases. Findings may represent pulmonary edema, infectious or inflammatory etiologies, or developing ARDS. 3. Cardiomegaly with small to moderate pericardial effusion. 4. Aortic Atherosclerosis (ICD10-I70.0). Coronary artery calcifications. Electronically Signed   By: Jeb Levering M.D.   On: 10/20/2017 05:38   Dg Chest Port 1 View  Result Date: 10/22/2017 CLINICAL DATA:  Pulmonary edema EXAM: PORTABLE CHEST 1 VIEW COMPARISON:   10/19/2017 FINDINGS: Cardiomegaly with vascular congestion. Increasing perihilar and bibasilar opacities could reflect atelectasis or edema. No visible significant effusions or acute bony abnormality. IMPRESSION: Cardiomegaly with vascular congestion. Increasing perihilar and lower lobe atelectasis and/or edema. Electronically Signed   By: Rolm Baptise M.D.   On: 10/22/2017 12:17    Cardiac catheterization 10/22/2017:  The left ventricular systolic function is normal.  LV end diastolic pressure is low.  The left ventricular ejection fraction is 55-65% by visual estimate.  There is no aortic valve stenosis.  Small pericardial effusion.   No CAD.  Chest pain is not ischemic in nature.  Subjective: Breathing back at baseline, no chest pain or palpitations. Denies fevers or joint aches or myalgias. No orthostatic symptoms.  Discharge Exam: Vitals:   10/23/17 2125 10/24/17 0415  BP: (!) 142/87 (!) 149/92  Pulse: 79 91  Resp: 18 18  Temp: 98.3 F (36.8 C) 97.9 F (36.6 C)  SpO2: 98% 98%   General: Pt is alert, awake, not in acute distress Cardiovascular: RRR, S1/S2 +, no rubs, no gallops Respiratory: Nonlabored on 3LPM. No crackles or wheezes.  Abdominal: Soft, NT, ND, bowel sounds + Extremities: No edema, no cyanosis  Labs: BNP (last 3 results) Recent Labs    10/20/17 0419 10/20/17 1848  BNP 253.5* 481.8*   Basic Metabolic Panel: Recent Labs  Lab 10/19/17 2325 10/20/17 0847 10/21/17 0516 10/22/17 0645 10/23/17 0552  NA 139 139 139 141 139  K 3.1* 4.0 4.2 4.4 3.9  CL 105 103 105 103 105  CO2 _0 GLUCOSE 39* 135* 160* 142* 141*  BUN _1 CREATININE 1.08* 1.00 1.03* 0.95 1.03*  CALCIUM 8.6* 8.4* 8.4* 8.9 8.7*  MG  --  1.9  --   --   --    Liver Function Tests: Recent Labs  Lab 10/20/17 0847  AST 25  ALT 16  ALKPHOS 49  BILITOT 0.6  PROT 7.4  ALBUMIN 2.6*   No results for input(s): LIPASE, AMYLASE in the last 168 hours. No  results for input(s): AMMONIA in the last 168 hours. CBC: Recent Labs  Lab 10/19/17 2325 10/21/17 0516 10/23/17 0552  WBC 6.4 5.1 3.8*  HGB 11.2* 10.8* 11.1*  HCT 35.7* 35.0* 36.2  MCV 76.9* 77.4* 78.2  PLT 457* 442* 476*   Cardiac Enzymes: Recent Labs  Lab 10/19/17 1426 10/20/17 0847 10/20/17 1231 10/20/17 1848  CKTOTAL 153  --   --   --   TROPONINI  --  <0.03 0.03* 0.03*  BNP: Invalid input(s): POCBNP CBG: Recent Labs  Lab 10/23/17 0759 10/23/17 1220 10/23/17 1641 10/23/17 2131 10/24/17 0744  GLUCAP 130* 189* 151* 190* 148*   D-Dimer No results for input(s): DDIMER in the last 72 hours. Hgb A1c No results for input(s): HGBA1C in the last 72 hours. Lipid Profile No results for input(s): CHOL, HDL, LDLCALC, TRIG, CHOLHDL, LDLDIRECT in the last 72 hours. Thyroid function studies No results for input(s): TSH, T4TOTAL, T3FREE, THYROIDAB in the last 72 hours.  Invalid input(s): FREET3 Anemia work up No results for input(s): VITAMINB12, FOLATE, FERRITIN, TIBC, IRON, RETICCTPCT in the last 72 hours. Urinalysis    Component Value Date/Time   COLORURINE YELLOW 11/30/2013 1057   APPEARANCEUR CLEAR 11/30/2013 1057   LABSPEC 1.010 11/30/2013 1057   PHURINE 5.5 11/30/2013 1057   GLUCOSEU NEGATIVE 11/30/2013 1057   HGBUR NEGATIVE 11/30/2013 1057   BILIRUBINUR NEGATIVE 11/30/2013 1057   KETONESUR NEGATIVE 11/30/2013 1057   PROTEINUR NEGATIVE 12/31/2011 1029   UROBILINOGEN 0.2 11/30/2013 1057   NITRITE NEGATIVE 11/30/2013 1057   LEUKOCYTESUR TRACE (A) 11/30/2013 1057    Microbiology Recent Results (from the past 240 hour(s))  Blood culture (routine x 2)     Status: None (Preliminary result)   Collection Time: 10/20/17  4:06 AM  Result Value Ref Range Status   Specimen Description BLOOD LEFT FOREARM  Final   Special Requests   Final    BOTTLES DRAWN AEROBIC AND ANAEROBIC Blood Culture adequate volume   Culture   Final    NO GROWTH 3 DAYS Performed at [facility], [place] [address] [place], [place] [address]    Report Status PENDING  Incomplete  Blood culture (routine x 2)     Status: None (Preliminary result)   Collection Time: 10/20/17  4:19 AM  Result Value Ref Range Status   Specimen Description BLOOD RIGHT ANTECUBITAL  Final   Special Requests   Final    BOTTLES DRAWN AEROBIC AND ANAEROBIC Blood Culture adequate volume   Culture   Final    NO GROWTH 3 DAYS Performed at [facility], [place] [address] [place], [place] [address]    Report Status PENDING  Incomplete    Time coordinating discharge: Approximately 40 minutes  Patrecia Pour, MD  [facility] 10/24/2017, 11:02 AM Pager [phone]

## 2017-10-24 NOTE — Plan of Care (Signed)
  Problem: Activity: Goal: Risk for activity intolerance will decrease Outcome: Completed/Met   Problem: Health Behavior/Discharge Planning: Goal: Ability to safely manage health-related needs after discharge will improve Outcome: Completed/Met

## 2017-10-25 LAB — CULTURE, BLOOD (ROUTINE X 2)
CULTURE: NO GROWTH
Culture: NO GROWTH
SPECIAL REQUESTS: ADEQUATE
Special Requests: ADEQUATE

## 2017-10-26 ENCOUNTER — Other Ambulatory Visit: Payer: Self-pay

## 2017-10-26 ENCOUNTER — Telehealth: Payer: Self-pay

## 2017-10-26 NOTE — Patient Outreach (Signed)
[facility]) Care Management  10/26/2017  [name] [birth year] [id number]   Referral received 10/21/17.   67 year old with hsitory of OSA, asthma, chornic hypoxia respiratory failure, HTN, DM. Transition of care to be completed by primary care office.  Recent admission 6/10- 6/15 with acute respiratory failure with hypoxia. She reports she continues to be short of breath with activity. Client states she has her oxygen set at 3liters. She states she has a follow up appointment on Monday.   Client lives by herself. She reports she has 3 sons living only one lives in [place]. And one son who is deceased. She states she uses transportation through her [place] to go to her doctor's appointment and states her son does the shopping for her. Limited resources will ask [facility] social work to address eligible resources.  Client acknowledges she has some depression. She reports today is her oldest son's birthday who passed away 6 years ago and this has her down sometimes. She agrees to social work consult to discuss depression resources.  Medications reviewed. Client denies any questions or issues with medication management.  History of low blood sugar. Per chart, Medications adjusted during hospitalization. Client reports checking her blood sugar twice a day. She reports her blood sugar this morning was 123; yesterday morning 179; and this evening 187 yesterday afternoon 144. She denies any issues at this time.  Plan: social work referral for depression resources. home visit next week.  Thea Silversmith, RN, MSN, [place] Coordinator Cell: [phone]

## 2017-10-26 NOTE — Telephone Encounter (Signed)
10/26/17   Transition Care Management Follow-up Telephone Call  ADMISSION DATE: 10/19/17  DISCHARGE DATE: 10/24/17   How have you been since you were released from the hospital? Patient states she has received may calls regarding follow up care and it is confusing he. This is my first call to schedule hospital follow up but patient has already scheduled with another nurse/office. Patient states she feels bad with dizziness and headache.    Do you understand why you were in the hospital? Yes   Do you understand the discharge instrcutions? Yes most of them per patient. States she is a little mixed up on medications. Advised to bring them in with her for follow up appointment. Patient agreed.     Items Reviewed:  Medications reviewed: Reviewed medications with patient all were correct.   Allergies reviewed: NKDA   Dietary changes reviewed: Heart healthy Carbohydrate modified.   Referrals reviewed: Appointment scheduled with Dr. Larose Kells for hospital follow up. Needs referral to Rheumatologist.   Functional Questionnaire:   Activities of Daily Living (ADLs): Patient states she needs help with all ADL's  Any patient concerns? Would like to know about appointment for Rheumatology.   Confirmed importance and date/time of follow-up visits scheduled: Yes   Confirmed with patient if condition begins to worsen call PCP or go to the ER. Yes   Patient was given the office number and encouragred to call back with questions or concerns. Yes

## 2017-10-26 NOTE — Telephone Encounter (Signed)
Enter a rheumatology referral, DX + ANA, pulmonary infiltrates, pericarditis. Advised patient to expect a phone call from the rheumatology office.

## 2017-10-26 NOTE — Telephone Encounter (Signed)
[facility] follow up attempted. Left message for patient to return call.

## 2017-10-27 ENCOUNTER — Other Ambulatory Visit: Payer: Self-pay | Admitting: Internal Medicine

## 2017-10-27 ENCOUNTER — Other Ambulatory Visit: Payer: Self-pay | Admitting: Endocrinology

## 2017-10-27 DIAGNOSIS — R768 Other specified abnormal immunological findings in serum: Secondary | ICD-10-CM

## 2017-10-27 DIAGNOSIS — I319 Disease of pericardium, unspecified: Secondary | ICD-10-CM

## 2017-10-27 DIAGNOSIS — R918 Other nonspecific abnormal finding of lung field: Secondary | ICD-10-CM

## 2017-10-27 NOTE — Progress Notes (Addendum)
Rheumatology referral

## 2017-10-27 NOTE — Telephone Encounter (Signed)
Referral entered as ordered.

## 2017-10-29 ENCOUNTER — Other Ambulatory Visit: Payer: Self-pay | Admitting: Licensed Clinical Social Worker

## 2017-10-29 NOTE — Patient Outreach (Signed)
[facility]) Care Management  10/29/2017  [name] 12-03-1950 [id number]  [facility] CSW completed initial outreach call after receiving new referral for grief support assistance. [facility] CSW was unable to reach patient successfully. HIPPA verifications received. [facility] CSW introduced self, reason for call and of social work services. Patient shares that her oldest son passed away in August 14, 2014 and his birthday was this past week. Patient shares that she did work with Hospice at one point but was only receiving telephonic services. [facility] CSW provided education on their support groups and individual counseling. [facility] CSW also provided education on available mental health resources within [facility]. Patient reports interest in only grief support information. [facility] CSW will mail this requested information to patient's residence. [facility] CSW questioned if patient was interested in gaining other transportation resources such as SCAT, [employer], Social research officer, government. Patient declined. Patient reports that she is comfortable using transportation through her [facility] and that she has heard that a lot of people have had to wait long times for SCAT and she is not able to stand for long periods. Patient denies wanting to pursue other transportation resources. Patient denies any further social work needs at this time. [facility] CSW received upcoming appointments with patient as well. [facility] CSW will mail requested information to patient at this time. [facility] CSW will not open program. [facility] CSW will follow up within two-three weeks to make sure resources were successfully received.  [name], BSW, MSW, [email or web address] Phone: [phone] Fax: [phone]

## 2017-10-29 NOTE — Patient Outreach (Signed)
[facility]) Care Management  10/29/2017  [name] 1950/05/13 [id number]   Request received from [place], Eula Fried, to mail grief resources to patient.  Resources mailed today.  Ronn Melena, BSW Social Worker [phone]

## 2017-11-02 ENCOUNTER — Other Ambulatory Visit (HOSPITAL_BASED_OUTPATIENT_CLINIC_OR_DEPARTMENT_OTHER): Payer: Medicare Other

## 2017-11-02 ENCOUNTER — Encounter: Payer: Self-pay | Admitting: Internal Medicine

## 2017-11-02 ENCOUNTER — Other Ambulatory Visit: Payer: Self-pay | Admitting: Internal Medicine

## 2017-11-02 ENCOUNTER — Ambulatory Visit (HOSPITAL_BASED_OUTPATIENT_CLINIC_OR_DEPARTMENT_OTHER)
Admission: RE | Admit: 2017-11-02 | Discharge: 2017-11-02 | Disposition: A | Discharge status: Home / Self Care | Payer: Medicare Other | Source: Ambulatory Visit | Attending: Internal Medicine | Admitting: Internal Medicine

## 2017-11-02 ENCOUNTER — Ambulatory Visit (INDEPENDENT_AMBULATORY_CARE_PROVIDER_SITE_OTHER): Payer: Medicare Other | Admitting: Internal Medicine

## 2017-11-02 VITALS — BP 126/84 | HR 98 | Temp 97.8°F | Resp 16 | Ht 69.0 in

## 2017-11-02 DIAGNOSIS — I3139 Other pericardial effusion (noninflammatory): Secondary | ICD-10-CM

## 2017-11-02 DIAGNOSIS — I509 Heart failure, unspecified: Secondary | ICD-10-CM | POA: Insufficient documentation

## 2017-11-02 DIAGNOSIS — E119 Type 2 diabetes mellitus without complications: Secondary | ICD-10-CM

## 2017-11-02 DIAGNOSIS — I309 Acute pericarditis, unspecified: Secondary | ICD-10-CM | POA: Insufficient documentation

## 2017-11-02 DIAGNOSIS — J9601 Acute respiratory failure with hypoxia: Secondary | ICD-10-CM

## 2017-11-02 DIAGNOSIS — I313 Pericardial effusion (noninflammatory): Secondary | ICD-10-CM

## 2017-11-02 DIAGNOSIS — E039 Hypothyroidism, unspecified: Secondary | ICD-10-CM

## 2017-11-02 DIAGNOSIS — Z794 Long term (current) use of insulin: Secondary | ICD-10-CM

## 2017-11-02 DIAGNOSIS — J810 Acute pulmonary edema: Secondary | ICD-10-CM | POA: Diagnosis not present

## 2017-11-02 DIAGNOSIS — R079 Chest pain, unspecified: Secondary | ICD-10-CM | POA: Diagnosis not present

## 2017-11-02 DIAGNOSIS — R0602 Shortness of breath: Secondary | ICD-10-CM | POA: Diagnosis not present

## 2017-11-02 LAB — CBC WITH DIFFERENTIAL/PLATELET
BASOS ABS: 0.1 10*3/uL (ref 0.0–0.1)
Basophils Relative: 2.4 % (ref 0.0–3.0)
EOS ABS: 0 10*3/uL (ref 0.0–0.7)
EOS PCT: 0.9 % (ref 0.0–5.0)
HCT: 35.6 % — ABNORMAL LOW (ref 36.0–46.0)
HEMOGLOBIN: 11.6 g/dL — AB (ref 12.0–15.0)
LYMPHS ABS: 0.9 10*3/uL (ref 0.7–4.0)
Lymphocytes Relative: 24 % (ref 12.0–46.0)
MCHC: 32.6 g/dL (ref 30.0–36.0)
MCV: 75.9 fl — ABNORMAL LOW (ref 78.0–100.0)
Monocytes Absolute: 0.6 10*3/uL (ref 0.1–1.0)
Monocytes Relative: 15.1 % — ABNORMAL HIGH (ref 3.0–12.0)
NEUTROS PCT: 57.6 % (ref 43.0–77.0)
Neutro Abs: 2.1 10*3/uL (ref 1.4–7.7)
Platelets: 388 10*3/uL (ref 150.0–400.0)
RBC: 4.69 Mil/uL (ref 3.87–5.11)
RDW: 21.5 % — ABNORMAL HIGH (ref 11.5–15.5)
WBC: 3.7 10*3/uL — AB (ref 4.0–10.5)

## 2017-11-02 LAB — BASIC METABOLIC PANEL
BUN: 12 mg/dL (ref 6–23)
CALCIUM: 9.3 mg/dL (ref 8.4–10.5)
CO2: 25 mEq/L (ref 19–32)
CREATININE: 0.98 mg/dL (ref 0.40–1.20)
Chloride: 104 mEq/L (ref 96–112)
GFR: 72.46 mL/min (ref >60.00)
GLUCOSE: 114 mg/dL — AB (ref 70–99)
POTASSIUM: 3.9 meq/L (ref 3.5–5.1)
Sodium: 140 mEq/L (ref 135–145)

## 2017-11-02 MED ORDER — PANTOPRAZOLE SODIUM 40 MG PO TBEC: 40.0000 mg | DELAYED_RELEASE_TABLET | Freq: Every day | ORAL | 1 refills | Status: DC

## 2017-11-02 NOTE — Progress Notes (Signed)
Pre visit review using our clinic review tool, if applicable. No additional management support is needed unless otherwise documented below in the visit note. 

## 2017-11-02 NOTE — Patient Instructions (Addendum)
GO TO THE LAB : Get the blood work     GO TO THE FRONT DESK Schedule your next appointment for a checkup in 3 months   STOP BY THE FIRST FLOOR:  get the XR   Use your oxygen all day and all night Stop ibuprofen Start pantoprazole 1 tablet every morning

## 2017-11-02 NOTE — Progress Notes (Signed)
Subjective:    Patient ID: [name], female    DOB: 1951-03-17, 67 y.o.   MRN: [id number]  DOS:  11/02/2017 Type of visit - description : TCM 14 Interval history: Patient was admitted to the hospital and discharged 10/24/2017. Admitted with worsening shortness of breath, she was hypoxic, CT showed no PE but GGO's; echo showed pericardial effusion with no tamponade. Cardiology was consulted, ANA noted to be positive. had a Cardiac catheterization with no significant CAD Was prescribed ibuprofen and colchicine due to pericarditis. Lasix was stopped because low LVEDP noted on cath. Groundglass opacities were noted on the CT, she will need a outpatient pulmonary follow-up. BNP was noted to be elevated, falls positive due to obesity?  Review of Systems Since she left the hospital, she has the same symptoms: Shortness of breath, DOE, anterior chest pain with exertion. Reportedly she is taking the medication as prescribed. When she arrived to the office she was not wearing her oxygen.  Denies fever, some chills?. He did report some heartburn and dysphagia to solids. No nausea, vomiting diarrhea or blood in the stools. Also reports cough at night with no hemoptysis, + yellow sputum.   Past Medical History:  Diagnosis Date  . Anterolisthesis    Grade 1, L4-5  . Bronchospasm 05/28/2012  . CHEST PAIN 11/18/2007  . DEGENERATIVE JOINT DISEASE 10/06/2006  . DEPRESSION 09/26/2008  . DIABETES MELLITUS 10/06/2006  . Diverticulosis    0175,1025  . GERD (gastroesophageal reflux disease) 07/25/2013  . HYPERLIPIDEMIA 01/11/2009  . HYPERTENSION 10/06/2006  . INSOMNIA 09/26/2008  . Internal hemorrhoids   . OBSTRUCTIVE SLEEP APNEA 06/23/2008   Severe OSA per sleep study 2010, Rx a CPAP  . Pain in joint, multiple sites 11/10/2006  . UNSPECIFIED ANEMIA 12/10/2009    Past Surgical History:  Procedure Laterality Date  . COLONOSCOPY  08/01/2011   Procedure: COLONOSCOPY;  Surgeon: Inda Castle, MD;   Location: [facility];  Service: Endoscopy;  Laterality: N/A;  . LEFT HEART CATH AND CORONARY ANGIOGRAPHY N/A 10/22/2017   Procedure: LEFT HEART CATH AND CORONARY ANGIOGRAPHY;  Surgeon: Jettie Booze, MD;  Location: [facility];  Service: Cardiovascular;  Laterality: N/A;  . 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 Right knee replacement  2005    Social History   Socioeconomic History  . Marital status: Widowed    Spouse name: Not on file  . Number of children: 4  . Years of education: Not on file  . Highest education level: Not on file  Occupational History  . Occupation: disability    Employer: UNEMPLOYED  Social Needs  . Financial resource strain: Not on file  . Food insecurity:    Worry: Not on file    Inability: Not on file  . Transportation needs:    Medical: Not on file    Non-medical: Not on file  Tobacco Use  . Smoking status: Current Some Day Smoker    Packs/day: 0.20    Years: 4.00    Pack years: 0.80    Types: Cigarettes    Last attempt to quit: 05/13/1995    Years since quitting: 22.4  . Smokeless tobacco: Never Used  Substance and Sexual Activity  . Alcohol use: Yes    Comment: brandy once per wk  . Drug use: No  . Sexual activity: Not Currently  Lifestyle  . Physical activity:    Days per week: Not on file    Minutes per session: Not on file  .  Stress: Not on file  Relationships  . Social connections:    Talks on phone: Not on file    Gets together: Not on file    Attends religious service: Not on file    Active member of club or organization: Not on file    Attends meetings of clubs or organizations: Not on file    Relationship status: Not on file  . Intimate partner violence:    Fear of current or ex partner: Not on file    Emotionally abused: Not on file    Physically abused: Not on file    Forced sexual activity: Not on file  Other Topics Concern  . Not on file  Social History Narrative   Widow , lives by herself   Lost a son, 3  living    Lost husband      Allergies as of 11/02/2017   No Known Allergies     Medication List        Accurate as of 11/02/17 11:59 PM. Always use your most recent med list.          ACCU-CHEK AVIVA PLUS test strip Generic drug:  glucose blood TEST BLOD SUGAR TWICE DAILY AND LANCETS TWICE DAILY   ACCU-CHEK SOFTCLIX LANCETS lancets USE TO CHECK BLOOD SUGAR TWICE A DAY   albuterol 108 (90 Base) MCG/ACT inhaler Commonly known as:  PROVENTIL HFA;VENTOLIN HFA Inhale 2 puffs into the lungs every 4 (four) hours as needed for wheezing.   aspirin EC 81 MG tablet Take 81 mg by mouth daily.   BD ULTRA-FINE PEN NEEDLES 29G X 12.7MM Misc Generic drug:  Insulin Pen Needle USE TWICE A DAY   budesonide-formoterol 160-4.5 MCG/ACT inhaler Commonly known as:  SYMBICORT Inhale 2 puffs into the lungs 2 (two) times daily.   colchicine 0.6 MG tablet Take 1 tablet (0.6 mg total) by mouth 2 (two) times daily.   diltiazem 360 MG 24 hr capsule Commonly known as:  CARDIZEM CD Take 1 capsule (360 mg total) by mouth daily.   Insulin Lispro Prot & Lispro (50-50) 100 UNIT/ML Kwikpen Commonly known as:  HUMALOG MIX 50/50 KWIKPEN Inject 5 Units into the skin 2 (two) times daily with a meal. And pen needles 2/day   latanoprost 0.005 % ophthalmic solution Commonly known as:  XALATAN USE 1 DROP IN BOTH EYES AT BEDTIME   levothyroxine 75 MCG tablet Commonly known as:  SYNTHROID, LEVOTHROID Take 1 tablet (75 mcg total) by mouth daily before breakfast.   metoprolol tartrate 25 MG tablet Commonly known as:  LOPRESSOR Take 1 tablet (25 mg total) by mouth 2 (two) times daily.   OXYGEN Inhale 3 L into the lungs continuous.   pantoprazole 40 MG tablet Commonly known as:  PROTONIX Take 1 tablet (40 mg total) by mouth daily before breakfast.   potassium chloride 10 MEQ tablet Commonly known as:  K-DUR,KLOR-CON TAKE 1 TABLET(10 MEQ) BY MOUTH DAILY   rosuvastatin 40 MG tablet Commonly known  as:  CRESTOR Take 1 tablet (40 mg total) by mouth daily.   telmisartan 40 MG tablet Commonly known as:  MICARDIS Take 1 tablet (40 mg total) by mouth daily.          Objective:   Physical Exam BP 126/84 (BP Location: Left Arm, Patient Position: Sitting, Cuff Size: Normal)   Pulse 98   Temp 97.8 F (36.6 C) (Oral)   Resp 16   Ht 5\' 9"  (1.753 m)   SpO2 90% Comment: on 3L  BMI 41.25 kg/m  General:   Well developed, mild distress at rest without oxygen. See BMI.  HEENT:  Normocephalic . Face symmetric, atraumatic Lungs:  Velcro type of crackles at both bases.  Otherwise decreased breath sounds Normal respiratory effort, no intercostal retractions, no accessory muscle use. Heart: RRR,  no murmur.  No pretibial edema bilaterally  Skin: Not pale. Not jaundice Neurologic:  alert & oriented X3.  Speech normal, gait not tested, sitting in a wheelchair Psych--  Cognition and judgment appear intact.  Cooperative with normal attention span and concentration.  Behavior appropriate. No anxious or depressed appearing.      A/P    Assessment DM per endocrinology HTN Hyperlipidemia For this in the next 2018 Depression, insomnia (citalopram caused headache ? 05-2014) MSK: --DJD, saw ortho ~06-2014 --Multiple joint pains, 2008, sedimentation rate, RA >> negative --Back pain>> -- MRI 2007: Epidural lipomatosis, congenitally short pedicles, spondylosis, and disc bulges and protrusions contribute to central, foraminal, and subarticular lateral recess stenoses as detailed above.  --2008, saw Ortho, Rx local injections  --local injections back Dr Mina Marble  ~ 12-2015 GERD Morbid obesity Pulmonary -Chronic respiratory failure  -asthma -OSA dx 2010, severe, nocturnal desaturation, intolerant to CPAP, on nocturnal O2 -ONO 01/2017:  + Hypoxia at night, rec  to continue oxygen at night unless able  to tolerate a CPAP   DOE, chronic CP, chronic RUQ pain:: 2004--Cath neg, areterial LE  dopplers (-) 06-2003-- abd. u/s fatty liver 01-2005-- CP...neg u/s GB, neg HIDA 10-06--saw cards--cardiolite neg 12-2007 abnormal stress test Cath 02-01-08: normal coronaries  chest pain, stress test echo okay 09/2016 SOB, admitted , cath 10/2017: no CAD  PLAN: Status post admission with worsening SOB and hypoxia. Pericarditis: Found to have pericarditis with + ANA, anti-Jo dsDNA negative discharged on ibuprofen for 2 weeks and colchicine for 3 months. Cardiac catheterization: No CAD Plan:  Refer to cardiology for follow-up Was already referred to rheumatology as suggested by the hospital team Continue colchicine for 3 months Discontinue ibuprofen, she is having upper GI symptoms. Pantoprazole for 6 weeks. BMP, CBC, chest x-ray Hypoxia: Poor oxygen compliance. O2 sat 83% but increased to 90% w/ O2 DM: A1c was 5.6, previously was taking insulin 50/50 65 units B.I.D., insulin was decreased to 5 units B.I.D. that is a big decrease however CBGs since she left the hospital are: 68, 103, 133, 113.  No change  Hypothyroidism: Currently on Synthroid 75 mcg daily. GGO (class ground opacities on CT): Has scheduled to see pulmonary soon, she is using Symbicort regularly, reports some cough and yellow sputum.  Velcro crackles noted at both bases today. Checking a chest x-ray. RTC 3 months

## 2017-11-03 ENCOUNTER — Other Ambulatory Visit: Payer: Self-pay

## 2017-11-03 DIAGNOSIS — J9801 Acute bronchospasm: Secondary | ICD-10-CM

## 2017-11-03 MED ORDER — ALBUTEROL SULFATE HFA 108 (90 BASE) MCG/ACT IN AERS: 2.0000 | INHALATION_SPRAY | RESPIRATORY_TRACT | 6 refills | Status: DC | PRN

## 2017-11-03 NOTE — Assessment & Plan Note (Signed)
Status post admission with worsening SOB and hypoxia. Pericarditis: Found to have pericarditis with + ANA, anti-Jo dsDNA negative discharged on ibuprofen for 2 weeks and colchicine for 3 months. Cardiac catheterization: No CAD Plan:  Refer to cardiology for follow-up Was already referred to rheumatology as suggested by the hospital team Continue colchicine for 3 months Discontinue ibuprofen, she is having upper GI symptoms. Pantoprazole for 6 weeks. BMP, CBC, chest x-ray Hypoxia: Poor oxygen compliance. O2 sat 83% but increased to 90% w/ O2 DM: A1c was 5.6, previously was taking insulin 50/50 65 units B.I.D., insulin was decreased to 5 units B.I.D. that is a big decrease however CBGs since she left the hospital are: 68, 103, 133, 113.  No change  Hypothyroidism: Currently on Synthroid 75 mcg daily. GGO (class ground opacities on CT): Has scheduled to see pulmonary soon, she is using Symbicort regularly, reports some cough and yellow sputum.  Velcro crackles noted at both bases today. Checking a chest x-ray. RTC 3 months

## 2017-11-03 NOTE — Patient Outreach (Signed)
[facility]) Care Management   11/03/2017  [name] 1950-10-16 [id number]  [name] is an 67 y.o. female  Subjective: "been laying in that bed hurting" client reports stomach discomfort, but states she has not started prescribed medication yet."I need somebody to come out here and help me", referring to personal care assistance.  Objective: BP (!) 140/94   Pulse 92   Resp (!) 24   Ht 1.753 m (5\' 9" )   SpO2 96%   BMI 41.25 kg/m    Review of Systems  Eyes: Positive for blurred vision.  Respiratory:       Lungs clear  Cardiovascular:       S1S2 noted.    Physical Exam skin warm, dry, color within normal limits.  Encounter Medications:   Outpatient Encounter Medications as of 11/03/2017  Medication Sig Note  . ACCU-CHEK AVIVA PLUS test strip TEST BLOD SUGAR TWICE DAILY AND LANCETS TWICE DAILY   . albuterol (PROVENTIL HFA;VENTOLIN HFA) 108 (90 BASE) MCG/ACT inhaler Inhale 2 puffs into the lungs every 4 (four) hours as needed for wheezing.   Marland Kitchen aspirin EC 81 MG tablet Take 81 mg by mouth daily.   . budesonide-formoterol (SYMBICORT) 160-4.5 MCG/ACT inhaler Inhale 2 puffs into the lungs 2 (two) times daily. (Patient taking differently: Inhale 2 puffs into the lungs daily as needed (SOB). ) 11/03/2017: States taking 2 puffs twice a day.  . colchicine 0.6 MG tablet Take 1 tablet (0.6 mg total) by mouth 2 (two) times daily.   Marland Kitchen diltiazem (CARDIZEM CD) 360 MG 24 hr capsule Take 1 capsule (360 mg total) by mouth daily.   . Insulin Lispro Prot & Lispro (HUMALOG MIX 50/50 KWIKPEN) (50-50) 100 UNIT/ML Kwikpen Inject 5 Units into the skin 2 (two) times daily with a meal. And pen needles 2/day   . latanoprost (XALATAN) 0.005 % ophthalmic solution USE 1 DROP IN BOTH EYES AT BEDTIME   . levothyroxine (SYNTHROID, LEVOTHROID) 75 MCG tablet Take 1 tablet (75 mcg total) by mouth daily before breakfast. 11/03/2017: Prescription bottle reads Levothyroxine 135mcg daily.   . metoprolol tartrate (LOPRESSOR) 25 MG tablet Take 1 tablet (25 mg total) by mouth 2 (two) times daily.   . OXYGEN Inhale 3 L into the lungs continuous.    . potassium chloride (K-DUR,KLOR-CON) 10 MEQ tablet TAKE 1 TABLET(10 MEQ) BY MOUTH DAILY   . rosuvastatin (CRESTOR) 40 MG tablet Take 1 tablet (40 mg total) by mouth daily.   Marland Kitchen telmisartan (MICARDIS) 40 MG tablet Take 1 tablet (40 mg total) by mouth daily.   Marland Kitchen ACCU-CHEK SOFTCLIX LANCETS lancets USE TO CHECK BLOOD SUGAR TWICE A DAY   . BD ULTRA-FINE PEN NEEDLES 29G X 12.7MM MISC USE TWICE A DAY   . pantoprazole (PROTONIX) 40 MG tablet Take 1 tablet (40 mg total) by mouth daily before breakfast.    No facility-administered encounter medications on file as of 11/03/2017.     Functional Status:   In your present state of health, do you have any difficulty performing the following activities: 10/26/2017 10/20/2017  Hearing? N N  Vision? N N  Comment - -  Difficulty concentrating or making decisions? N N  Walking or climbing stairs? Y Y  Comment - -  Dressing or bathing? Y N  Doing errands, shopping? Y [place] and eating ? N -  Using the Toilet? N -  In the past six months, have you accidently leaked urine?  N -  Do you have problems with loss of bowel control? N -  Managing your Medications? N -  Managing your Finances? N -  Housekeeping or managing your Housekeeping? Y -  Comment - -  Some recent data might be hidden    Fall/Depression Screening:    Fall Risk  10/26/2017 08/06/2017 03/31/2017  Falls in the past year? No Yes Yes  Number falls in past yr: - 1 1  Injury with Fall? - No No  Comment - - -  Risk for fall due to : - History of fall(s) -  Risk for fall due to: Comment - patient reports that she fell once last summer when she slipped during mopping her floor; no loss of consiousness and no injury -  Follow up - Falls prevention discussed;Education provided Education provided;Falls prevention  discussed   PHQ 2/9 Scores 10/26/2017 08/06/2017 03/31/2017 03/26/2016 10/31/2015 02/22/2015 10/26/2013  PHQ - 2 Score 3 1 2  0 0 0 6  PHQ- 9 Score 13 - 5 - - - -  Exception Documentation - - - - - Other- indicate reason in comment box -  Not completed - - - - - Pt's son passed in June 2016 -    Assessment:  68 year old with hsitory of OSA, asthma, chornic hypoxia respiratory failure, HTN, DM.  Recent admission 6/10- 6/15 with acute respiratory failure with hypoxia. She reports she continues to be short of breath with activity. Oxygen at 3liters/nasal cannula. Per chart: Pericarditis.  RNCM completed home visit:  Client lives alone. She reports only one son who lives in [place]. She states she uses transportation through [employer] benefit to go to her doctor appointments and states her son does the shopping for her.  Short of breath with activity and periods of shortness of breath noted at rest. O2 at 3l/[place].  Client reports seeing primary care provider on yesterday and reports she was prescribed Pantoprazole for stomach pain. Client states she has not picked up prescription and her son has to take her to pick prescriptions up from pharmacy. Client declines pharmacy delivery adding she has used pharmacy delivery in the past and prefers to go to the pharmacy to pick up her medication.   Medications reviewed. Client does not have relief inhaler. RNCM called pharmacy [facility] pharmacy requesting refill. Pharmacy reports it has has been so long since client received inhaler that they don't have a prescription on file. RNCM called primary care office. RNCM called and spoke with Rod Holler who reports discussed with primary care who wants client to have a rescue inhaler available and will send a prescription to client's pharmacy.  Client requesting Personal care service- "I need someone here to help me". When [facility] asked client if she has Medicaid, Client states she does not have Medicaid. RNCM  discussed limited payor resources for payment of personal care service. Client reports she has had personal care service in the past. RNCM will refer to [place] work regarding personal care service.  History of diabetes. Client denies low blood sugar since insulin dosing change. Per glucose meter lowest reading noted since discharge was 68 on 6/21. Highest reading was 187. Client has follow up appointment scheduled with endocrinologist.  [facility] 7/11  Dr. Elsworth Soho 7/17 Cardiology 7/22  Dr. Loanne Drilling 7/26 Dr. Marcelino Scot 8/2 Dr. Larose Kells 9/4 Dr. Katy Fitch- 9/10  RNCM called and discussed client's personal care service needs with Eula Fried, LCSW. Client could also benefit from home health: Nursing and [place] aide,  if primary care agrees.  Plan: follow up with primary care. home visit within the next 2-3 weeks.   [facility] CM Care Plan Problem One     Most Recent Value  Care Plan Problem One  Care Coordination and Education Needs re: Respiratory Failure management  Role Documenting the Problem One  Care Management Coordinator  Care Plan for Problem One  Active  THN Long Term Goal   Over the next 60 days, patient will verbalize receipt of coordination services and will verbalize undertanding of plan of care for management of respiratory failure  THN Long Term Goal Start Date  10/26/17  Interventions for Problem One Long Term Goal  RNCM provided [facility] calender/organizer, reviewed COPD action plan, RNCM called primary care regarding albuterol inhaler, reinforced the importance of having rescue inhaler available if needed, encouraged client to call RNCM as needed and reinforced and encouraged to call RNCM as needed. provided and discussed EMMI education regarding Pericarditis.  THN CM Short Term Goal #1   Over the next 30 days, patient will attend all scheduled provider and diagnostic appointments  THN CM Short Term Goal #1 Start Date  10/26/17  Interventions for Short Term Goal #1  RNCM discussed with client  her acess to transportation, reviewed upcoming appointments.  THN CM Short Term Goal #2   Over the next 30 days, patient will report any new or worsened symptoms to PCP as soon as noted  THN CM Short Term Goal #2 Start Date  10/26/17  Interventions for Short Term Goal #2  reviewed COPD action plan.    THN CM Care Plan Problem Two     Most Recent Value  Care Plan for Problem Two  Active  THN CM Short Term Goal #1   client will report improvement of pain within the next 30 days.  THN CM Short Term Goal #1 Start Date  11/03/17  Interventions for Short Term Goal #2   RNCM encouraged client to pick up prescribed medication from pharmacy as soon as possible, RNCM reviewed other options for obtaining medications such as getting medications from a pharmacy that will deliver. reviewed use of heat  THN CM Short Term Goal #2   client will take medications as prescribed within the next 30 days.  THN CM Short Term Goal #2 Start Date  11/03/17  Interventions for Short Term Goal #2  medications reviewed.    [facility] CM Care Plan Problem Three     Most Recent Value  Care Plan Problem Three  Knowledge Deficits re: long term plan of care for management of DM  Role Documenting the Problem Three  Care Management Coordinator  Care Plan for Problem Three  Active  THN Long Term Goal   Over the next 60 days, patient will verbalize understanding of long term plan of care for mangaement of DM  THN Long Term Goal Start Date  10/26/17  Interventions for Problem Three Long Term Goal  encouraged client to continue to check blood sugar and follow up with endocrinologist.  [facility] CM Short Term Goal #1   Over the next 30 days, patient will continue to check and record cbg's daily.   THN CM Short Term Goal #1 Start Date  10/26/17  Interventions for Short Term Goal #1  RNCM reviewed glucose readings with client, provided positive feedback and encouarged client to continue to check blood sugars as instructed per provider.  THN CM  Short Term Goal #2   Over the next 30 days, patient will  verbalize understanding of signs and symptoms of hypoglycemia  THN CM Short Term Goal #2 Start Date  10/26/17  Interventions for Short Term Goal #2  discussed importance of recognizing signs/symptoms of low blood sugar     Thea Silversmith, RN, MSN, [place] Coordinator Cell: [phone]

## 2017-11-04 ENCOUNTER — Telehealth: Payer: Self-pay

## 2017-11-04 ENCOUNTER — Telehealth: Payer: Self-pay | Admitting: Adult Health

## 2017-11-04 ENCOUNTER — Other Ambulatory Visit: Payer: Self-pay

## 2017-11-04 DIAGNOSIS — E119 Type 2 diabetes mellitus without complications: Secondary | ICD-10-CM

## 2017-11-04 DIAGNOSIS — I3139 Other pericardial effusion (noninflammatory): Secondary | ICD-10-CM

## 2017-11-04 DIAGNOSIS — Z794 Long term (current) use of insulin: Secondary | ICD-10-CM

## 2017-11-04 DIAGNOSIS — J9601 Acute respiratory failure with hypoxia: Secondary | ICD-10-CM

## 2017-11-04 DIAGNOSIS — I313 Pericardial effusion (noninflammatory): Secondary | ICD-10-CM

## 2017-11-04 NOTE — Telephone Encounter (Signed)
New message    Patient requesting call from provider Jory Sims NP. Declined to give reason for call.  Patient was asked  If this was an urgent issue, she stated it was not.

## 2017-11-04 NOTE — Telephone Encounter (Signed)
Please advise 

## 2017-11-04 NOTE — Patient Outreach (Signed)
[name] [facility]) Care Management  11/04/2017  [name] [birth year] [id number]   First outreach to patient regarding social work referral for [employer]. As requested by RNCM, Thea Silversmith, BSW talked with patient about her openness to home health services while  [place] are being requested.  Patient is open to these services and this was communicated to [place].   BSW faxed request for PCS to Dr. Larose Kells.   Patient reported that she is utilizing [facility] for transportation services.  Per patient, she was using Medicaid transportation but was recently told by them that she is no longer eligible.  BSW encouraged patient to contact her Medicaid Caseworker about this.   Patient reported that she is no longer able to mobilize to her mailbox at her apartment complex.  BSW agreed to contact the post office regarding a request for a mailbox at her door.   BSW will follow up with patient within the next two weeks regarding the status of the [facility] request and the response from the post office.    Ronn Melena, BSW Social Worker [phone]

## 2017-11-04 NOTE — Telephone Encounter (Signed)
Copied from [place] [id number]. Topic: General - Other >> Nov 04, 2017  3:43 PM Keene Breath wrote: Reason for CRM: Denton Brick from [place] [employer] called to request an order for patient to receive home health nursing and bath aid.  Patient has agreed to service and now doctor has to send the order.  CB# [phone].

## 2017-11-04 NOTE — Telephone Encounter (Signed)
Agrees, please send the order

## 2017-11-04 NOTE — Telephone Encounter (Signed)
Orders placed.

## 2017-11-04 NOTE — Patient Outreach (Signed)
[facility]) Care Management  11/04/2017  [name] July 23, 1950 [id number]   RNCM called primary care office to request a referral for home health agency:nursing due to respiratory issues and recent discharge from hospital and bath aid, if agreeable. Message left with office staff.  Plan:continue to follow.  Thea Silversmith, RN, MSN, [place] Coordinator Cell: [phone]

## 2017-11-04 NOTE — Telephone Encounter (Signed)
RETURNED CALL TO PT SHE WAS JUST WONDERING WHY "SO MANY PEOPLE KEEP CALLING" ALL QUESTIONS ANSWERED

## 2017-11-06 ENCOUNTER — Telehealth: Payer: Self-pay | Admitting: *Deleted

## 2017-11-06 NOTE — Telephone Encounter (Addendum)
Received [id number] Request for Independent Assessment for [place], Attestation of Medical Need from [place], Attn: Geographical information systems officer, Education officer, museum. Completed as much as possible; forwarded to provider/SLS 07/01

## 2017-11-09 DIAGNOSIS — R0902 Hypoxemia: Secondary | ICD-10-CM | POA: Diagnosis not present

## 2017-11-09 DIAGNOSIS — G4733 Obstructive sleep apnea (adult) (pediatric): Secondary | ICD-10-CM | POA: Diagnosis not present

## 2017-11-09 DIAGNOSIS — N390 Urinary tract infection, site not specified: Secondary | ICD-10-CM

## 2017-11-09 HISTORY — DX: Urinary tract infection, site not specified: N39.0

## 2017-11-09 NOTE — Telephone Encounter (Signed)
Paperwork faxed [facility] at attention of Amber Chrismon/SLS 07/01

## 2017-11-10 ENCOUNTER — Other Ambulatory Visit: Payer: Self-pay

## 2017-11-10 ENCOUNTER — Telehealth: Payer: Self-pay | Admitting: Internal Medicine

## 2017-11-10 ENCOUNTER — Ambulatory Visit: Payer: Self-pay

## 2017-11-10 DIAGNOSIS — M6281 Muscle weakness (generalized): Secondary | ICD-10-CM | POA: Diagnosis not present

## 2017-11-10 DIAGNOSIS — Z9981 Dependence on supplemental oxygen: Secondary | ICD-10-CM | POA: Diagnosis not present

## 2017-11-10 DIAGNOSIS — J9621 Acute and chronic respiratory failure with hypoxia: Secondary | ICD-10-CM | POA: Diagnosis not present

## 2017-11-10 DIAGNOSIS — I10 Essential (primary) hypertension: Secondary | ICD-10-CM | POA: Diagnosis not present

## 2017-11-10 DIAGNOSIS — Z7982 Long term (current) use of aspirin: Secondary | ICD-10-CM | POA: Diagnosis not present

## 2017-11-10 DIAGNOSIS — Z794 Long term (current) use of insulin: Secondary | ICD-10-CM | POA: Diagnosis not present

## 2017-11-10 DIAGNOSIS — J449 Chronic obstructive pulmonary disease, unspecified: Secondary | ICD-10-CM | POA: Diagnosis not present

## 2017-11-10 DIAGNOSIS — Z7951 Long term (current) use of inhaled steroids: Secondary | ICD-10-CM | POA: Diagnosis not present

## 2017-11-10 DIAGNOSIS — J45909 Unspecified asthma, uncomplicated: Secondary | ICD-10-CM | POA: Diagnosis not present

## 2017-11-10 DIAGNOSIS — M1990 Unspecified osteoarthritis, unspecified site: Secondary | ICD-10-CM | POA: Diagnosis not present

## 2017-11-10 DIAGNOSIS — R000 Tachycardia, unspecified: Secondary | ICD-10-CM | POA: Diagnosis not present

## 2017-11-10 DIAGNOSIS — E1149 Type 2 diabetes mellitus with other diabetic neurological complication: Secondary | ICD-10-CM | POA: Diagnosis not present

## 2017-11-10 DIAGNOSIS — R262 Difficulty in walking, not elsewhere classified: Secondary | ICD-10-CM | POA: Diagnosis not present

## 2017-11-10 NOTE — Telephone Encounter (Signed)
LMOM w/ verbal orders.  

## 2017-11-10 NOTE — Telephone Encounter (Signed)
Copied from [place] [phone]. Topic: General - Other >> Nov 10, 2017  2:53 PM Mcneil, Ja-Kwan wrote: Reason for CRM: [place] with [facility] request verbal orders for skilled nursing for 2 times a week for 3 weeks and 1 time a week for 4 weeks also home health aid 2 times a week for 7 weeks. Cb# [phone]

## 2017-11-10 NOTE — Patient Outreach (Signed)
[facility]) Care Management  11/10/2017  [name] [birth year] [id number]   BSW received completed request for [place] from Dr. Larose Kells.  Request was faxed to [name].  BSW attempted to contact patient to provide update on status of request but she did not answer and there was no option to leave a voicemail message. Unsuccessful outreach letter mailedWill make another attempt within four business days.   Ronn Melena, BSW Social Worker [phone]

## 2017-11-12 ENCOUNTER — Encounter (HOSPITAL_COMMUNITY): Payer: Self-pay | Admitting: Emergency Medicine

## 2017-11-12 ENCOUNTER — Observation Stay (HOSPITAL_COMMUNITY)
Admission: EM | Admit: 2017-11-12 | Discharge: 2017-11-13 | Disposition: A | Discharge status: Home / Self Care | Payer: Medicare Other | Attending: Family Medicine | Admitting: Family Medicine

## 2017-11-12 ENCOUNTER — Other Ambulatory Visit: Payer: Self-pay

## 2017-11-12 ENCOUNTER — Emergency Department (HOSPITAL_COMMUNITY): Payer: Medicare Other

## 2017-11-12 DIAGNOSIS — R0789 Other chest pain: Secondary | ICD-10-CM

## 2017-11-12 DIAGNOSIS — R079 Chest pain, unspecified: Secondary | ICD-10-CM | POA: Diagnosis present

## 2017-11-12 DIAGNOSIS — G4733 Obstructive sleep apnea (adult) (pediatric): Secondary | ICD-10-CM | POA: Diagnosis not present

## 2017-11-12 DIAGNOSIS — R0602 Shortness of breath: Secondary | ICD-10-CM | POA: Diagnosis not present

## 2017-11-12 DIAGNOSIS — Z8249 Family history of ischemic heart disease and other diseases of the circulatory system: Secondary | ICD-10-CM | POA: Insufficient documentation

## 2017-11-12 DIAGNOSIS — R1310 Dysphagia, unspecified: Secondary | ICD-10-CM

## 2017-11-12 DIAGNOSIS — Z87891 Personal history of nicotine dependence: Secondary | ICD-10-CM | POA: Diagnosis not present

## 2017-11-12 DIAGNOSIS — Z800 Family history of malignant neoplasm of digestive organs: Secondary | ICD-10-CM | POA: Diagnosis not present

## 2017-11-12 DIAGNOSIS — J9621 Acute and chronic respiratory failure with hypoxia: Secondary | ICD-10-CM | POA: Diagnosis not present

## 2017-11-12 DIAGNOSIS — E1149 Type 2 diabetes mellitus with other diabetic neurological complication: Secondary | ICD-10-CM | POA: Diagnosis not present

## 2017-11-12 DIAGNOSIS — Z9981 Dependence on supplemental oxygen: Secondary | ICD-10-CM | POA: Insufficient documentation

## 2017-11-12 DIAGNOSIS — J9611 Chronic respiratory failure with hypoxia: Secondary | ICD-10-CM | POA: Diagnosis present

## 2017-11-12 DIAGNOSIS — K224 Dyskinesia of esophagus: Secondary | ICD-10-CM | POA: Insufficient documentation

## 2017-11-12 DIAGNOSIS — M1990 Unspecified osteoarthritis, unspecified site: Secondary | ICD-10-CM | POA: Insufficient documentation

## 2017-11-12 DIAGNOSIS — Z794 Long term (current) use of insulin: Secondary | ICD-10-CM | POA: Insufficient documentation

## 2017-11-12 DIAGNOSIS — Z6838 Body mass index (BMI) 38.0-38.9, adult: Secondary | ICD-10-CM | POA: Insufficient documentation

## 2017-11-12 DIAGNOSIS — Z79899 Other long term (current) drug therapy: Secondary | ICD-10-CM | POA: Diagnosis not present

## 2017-11-12 DIAGNOSIS — K219 Gastro-esophageal reflux disease without esophagitis: Secondary | ICD-10-CM | POA: Diagnosis not present

## 2017-11-12 DIAGNOSIS — R109 Unspecified abdominal pain: Secondary | ICD-10-CM | POA: Diagnosis not present

## 2017-11-12 DIAGNOSIS — J45909 Unspecified asthma, uncomplicated: Secondary | ICD-10-CM | POA: Insufficient documentation

## 2017-11-12 DIAGNOSIS — E785 Hyperlipidemia, unspecified: Secondary | ICD-10-CM | POA: Diagnosis not present

## 2017-11-12 DIAGNOSIS — Z7982 Long term (current) use of aspirin: Secondary | ICD-10-CM | POA: Insufficient documentation

## 2017-11-12 DIAGNOSIS — I319 Disease of pericardium, unspecified: Secondary | ICD-10-CM | POA: Diagnosis not present

## 2017-11-12 DIAGNOSIS — E039 Hypothyroidism, unspecified: Secondary | ICD-10-CM | POA: Insufficient documentation

## 2017-11-12 DIAGNOSIS — Z79890 Hormone replacement therapy (postmenopausal): Secondary | ICD-10-CM | POA: Insufficient documentation

## 2017-11-12 DIAGNOSIS — I10 Essential (primary) hypertension: Secondary | ICD-10-CM | POA: Diagnosis not present

## 2017-11-12 DIAGNOSIS — Z7951 Long term (current) use of inhaled steroids: Secondary | ICD-10-CM | POA: Insufficient documentation

## 2017-11-12 DIAGNOSIS — E119 Type 2 diabetes mellitus without complications: Secondary | ICD-10-CM

## 2017-11-12 LAB — CBC WITH DIFFERENTIAL/PLATELET
Abs Immature Granulocytes: 0 10*3/uL (ref 0.0–0.1)
BASOS ABS: 0 10*3/uL (ref 0.0–0.1)
Basophils Relative: 0 %
EOS ABS: 0 10*3/uL (ref 0.0–0.7)
Eosinophils Relative: 1 %
HEMATOCRIT: 37 % (ref 36.0–46.0)
Hemoglobin: 11.4 g/dL — ABNORMAL LOW (ref 12.0–15.0)
Immature Granulocytes: 1 %
LYMPHS PCT: 14 %
Lymphs Abs: 0.7 10*3/uL (ref 0.7–4.0)
MCH: 23.7 pg — ABNORMAL LOW (ref 26.0–34.0)
MCHC: 30.8 g/dL (ref 30.0–36.0)
MCV: 76.9 fL — ABNORMAL LOW (ref 78.0–100.0)
MONOS PCT: 15 %
Monocytes Absolute: 0.8 10*3/uL (ref 0.1–1.0)
NEUTROS ABS: 3.7 10*3/uL (ref 1.7–7.7)
NEUTROS PCT: 69 %
Platelets: 347 10*3/uL (ref 150–400)
RBC: 4.81 MIL/uL (ref 3.87–5.11)
RDW: 20.2 % — AB (ref 11.5–15.5)
WBC: 5.2 10*3/uL (ref 4.0–10.5)

## 2017-11-12 LAB — COMPREHENSIVE METABOLIC PANEL
ALT: 24 U/L (ref 0–44)
ANION GAP: 12 (ref 5–15)
AST: 44 U/L — ABNORMAL HIGH (ref 15–41)
Albumin: 3.1 g/dL — ABNORMAL LOW (ref 3.5–5.0)
Alkaline Phosphatase: 58 U/L (ref 38–126)
BUN: 10 mg/dL (ref 8–23)
CHLORIDE: 105 mmol/L (ref 98–111)
CO2: 23 mmol/L (ref 22–32)
Calcium: 8.8 mg/dL — ABNORMAL LOW (ref 8.9–10.3)
Creatinine, Ser: 1.38 mg/dL — ABNORMAL HIGH (ref 0.44–1.00)
GFR calc Af Amer: 44 mL/min — ABNORMAL LOW (ref >60)
GFR, EST NON AFRICAN AMERICAN: 38 mL/min — AB (ref >60)
Glucose, Bld: 104 mg/dL — ABNORMAL HIGH (ref 70–99)
POTASSIUM: 3 mmol/L — AB (ref 3.5–5.1)
SODIUM: 140 mmol/L (ref 135–145)
Total Bilirubin: 0.9 mg/dL (ref 0.3–1.2)
Total Protein: 8.1 g/dL (ref 6.5–8.1)

## 2017-11-12 LAB — I-STAT TROPONIN, ED: TROPONIN I, POC: 0.04 ng/mL (ref 0.00–0.08)

## 2017-11-12 LAB — TROPONIN I
TROPONIN I: 0.04 ng/mL — AB (ref <0.03)
TROPONIN I: 0.03 ng/mL — AB (ref <0.03)

## 2017-11-12 LAB — GLUCOSE, CAPILLARY
GLUCOSE-CAPILLARY: 69 mg/dL — AB (ref 70–99)
GLUCOSE-CAPILLARY: 122 mg/dL — AB (ref 70–99)

## 2017-11-12 LAB — BRAIN NATRIURETIC PEPTIDE: B Natriuretic Peptide: 278.8 pg/mL — ABNORMAL HIGH (ref 0.0–100.0)

## 2017-11-12 MED ORDER — ASPIRIN EC 81 MG PO TBEC
81.0000 mg | DELAYED_RELEASE_TABLET | Freq: Every day | ORAL | Status: DC
  Administered 2017-11-13: 81 mg via ORAL
  Filled 2017-11-12: qty 1

## 2017-11-12 MED ORDER — SODIUM CHLORIDE 0.9% FLUSH
3.0000 mL | Freq: Two times a day (BID) | INTRAVENOUS | Status: DC
  Administered 2017-11-12 – 2017-11-13 (×3): 3 mL via INTRAVENOUS

## 2017-11-12 MED ORDER — ONDANSETRON HCL 4 MG/2ML IJ SOLN: 4.0000 mg | Freq: Four times a day (QID) | INTRAMUSCULAR | Status: DC | PRN

## 2017-11-12 MED ORDER — COLCHICINE 0.6 MG PO TABS
0.6000 mg | ORAL_TABLET | Freq: Two times a day (BID) | ORAL | Status: DC
  Administered 2017-11-12 – 2017-11-13 (×2): 0.6 mg via ORAL
  Filled 2017-11-12 (×2): qty 1

## 2017-11-12 MED ORDER — ACETAMINOPHEN 650 MG RE SUPP: 650.0000 mg | Freq: Four times a day (QID) | RECTAL | Status: DC | PRN

## 2017-11-12 MED ORDER — ENOXAPARIN SODIUM 40 MG/0.4ML ~~LOC~~ SOLN
40.0000 mg | SUBCUTANEOUS | Status: DC
  Administered 2017-11-12 – 2017-11-13 (×2): 40 mg via SUBCUTANEOUS
  Filled 2017-11-12 (×2): qty 0.4

## 2017-11-12 MED ORDER — POTASSIUM CHLORIDE CRYS ER 20 MEQ PO TBCR
20.0000 meq | EXTENDED_RELEASE_TABLET | Freq: Once | ORAL | Status: AC
  Administered 2017-11-12: 20 meq via ORAL
  Filled 2017-11-12: qty 1

## 2017-11-12 MED ORDER — INSULIN ASPART 100 UNIT/ML ~~LOC~~ SOLN
0.0000 [IU] | Freq: Three times a day (TID) | SUBCUTANEOUS | Status: DC
  Administered 2017-11-13: 2 [IU] via SUBCUTANEOUS
  Administered 2017-11-13: 3 [IU] via SUBCUTANEOUS

## 2017-11-12 MED ORDER — LEVOTHYROXINE SODIUM 75 MCG PO TABS
75.0000 ug | ORAL_TABLET | Freq: Every day | ORAL | Status: DC
  Administered 2017-11-13: 75 ug via ORAL
  Filled 2017-11-12: qty 1

## 2017-11-12 MED ORDER — PANTOPRAZOLE SODIUM 40 MG PO TBEC
40.0000 mg | DELAYED_RELEASE_TABLET | Freq: Every day | ORAL | Status: DC
  Administered 2017-11-13: 40 mg via ORAL
  Filled 2017-11-12: qty 1

## 2017-11-12 MED ORDER — ROSUVASTATIN CALCIUM 40 MG PO TABS
40.0000 mg | ORAL_TABLET | Freq: Every day | ORAL | Status: DC
  Administered 2017-11-12 – 2017-11-13 (×2): 40 mg via ORAL
  Filled 2017-11-12: qty 4
  Filled 2017-11-12 (×2): qty 1
  Filled 2017-11-12: qty 4

## 2017-11-12 MED ORDER — IRBESARTAN 150 MG PO TABS
150.0000 mg | ORAL_TABLET | Freq: Every day | ORAL | Status: DC
  Administered 2017-11-12 – 2017-11-13 (×2): 150 mg via ORAL
  Filled 2017-11-12 (×2): qty 1

## 2017-11-12 MED ORDER — DILTIAZEM HCL ER COATED BEADS 360 MG PO CP24
360.0000 mg | ORAL_CAPSULE | Freq: Every day | ORAL | Status: DC
  Administered 2017-11-12 – 2017-11-13 (×2): 360 mg via ORAL
  Filled 2017-11-12: qty 2
  Filled 2017-11-12 (×2): qty 1
  Filled 2017-11-12: qty 2

## 2017-11-12 MED ORDER — ONDANSETRON HCL 4 MG PO TABS: 4.0000 mg | ORAL_TABLET | Freq: Four times a day (QID) | ORAL | Status: DC | PRN

## 2017-11-12 MED ORDER — LATANOPROST 0.005 % OP SOLN
1.0000 [drp] | Freq: Every day | OPHTHALMIC | Status: DC
  Administered 2017-11-12: 1 [drp] via OPHTHALMIC
  Filled 2017-11-12: qty 2.5

## 2017-11-12 MED ORDER — MOMETASONE FURO-FORMOTEROL FUM 200-5 MCG/ACT IN AERO
2.0000 | INHALATION_SPRAY | Freq: Two times a day (BID) | RESPIRATORY_TRACT | Status: DC
  Administered 2017-11-12 – 2017-11-13 (×2): 2 via RESPIRATORY_TRACT
  Filled 2017-11-12: qty 8.8

## 2017-11-12 MED ORDER — POTASSIUM CHLORIDE CRYS ER 20 MEQ PO TBCR
40.0000 meq | EXTENDED_RELEASE_TABLET | Freq: Once | ORAL | Status: AC
  Administered 2017-11-12: 40 meq via ORAL
  Filled 2017-11-12: qty 2

## 2017-11-12 MED ORDER — ASPIRIN 81 MG PO CHEW
324.0000 mg | CHEWABLE_TABLET | Freq: Once | ORAL | Status: AC
  Administered 2017-11-12: 324 mg via ORAL
  Filled 2017-11-12: qty 4

## 2017-11-12 MED ORDER — ALBUTEROL SULFATE (2.5 MG/3ML) 0.083% IN NEBU
2.5000 mg | INHALATION_SOLUTION | RESPIRATORY_TRACT | Status: DC | PRN
  Administered 2017-11-13: 2.5 mg via RESPIRATORY_TRACT
  Filled 2017-11-12: qty 3

## 2017-11-12 MED ORDER — DOCUSATE SODIUM 100 MG PO CAPS
100.0000 mg | ORAL_CAPSULE | Freq: Two times a day (BID) | ORAL | Status: DC
  Administered 2017-11-12 – 2017-11-13 (×2): 100 mg via ORAL
  Filled 2017-11-12 (×2): qty 1

## 2017-11-12 MED ORDER — ACETAMINOPHEN 325 MG PO TABS: 650.0000 mg | ORAL_TABLET | Freq: Four times a day (QID) | ORAL | Status: DC | PRN

## 2017-11-12 MED ORDER — NITROGLYCERIN 0.4 MG SL SUBL
0.4000 mg | SUBLINGUAL_TABLET | SUBLINGUAL | Status: DC | PRN
  Administered 2017-11-13: 0.4 mg via SUBLINGUAL
  Filled 2017-11-12: qty 1

## 2017-11-12 MED ORDER — INSULIN LISPRO PROT & LISPRO (50-50 MIX) 100 UNIT/ML KWIKPEN: 5.0000 [IU] | PEN_INJECTOR | Freq: Two times a day (BID) | SUBCUTANEOUS | Status: DC

## 2017-11-12 MED ORDER — INSULIN ASPART 100 UNIT/ML ~~LOC~~ SOLN: 0.0000 [IU] | Freq: Every day | SUBCUTANEOUS | Status: DC

## 2017-11-12 MED ORDER — METOPROLOL TARTRATE 25 MG PO TABS
25.0000 mg | ORAL_TABLET | Freq: Two times a day (BID) | ORAL | Status: DC
  Administered 2017-11-12 – 2017-11-13 (×2): 25 mg via ORAL
  Filled 2017-11-12 (×2): qty 1

## 2017-11-12 MED ORDER — FUROSEMIDE 10 MG/ML IJ SOLN
40.0000 mg | Freq: Once | INTRAMUSCULAR | Status: AC
  Administered 2017-11-12: 40 mg via INTRAVENOUS
  Filled 2017-11-12: qty 4

## 2017-11-12 NOTE — ED Triage Notes (Signed)
Pt. Stated, I started having chest pain this morning. Denies any other symptoms.

## 2017-11-12 NOTE — ED Notes (Addendum)
Attempted to call report to [facility] x 1 unsuccessfully. This RN told by NS that both receiving RN & charge nurse are at lunch and unable to take report at this time.

## 2017-11-12 NOTE — ED Provider Notes (Signed)
[facility] EMERGENCY DEPARTMENT Provider Note   CSN: [id number] Arrival date & time: 11/12/17  4196     History   Chief Complaint Chief Complaint  Patient presents with  . Chest Pain    HPI [name] is a 67 y.o. female.  HPI   [name] is a 67 y.o. female, with a history of DM, HTN, hyperlipidemia, and tobacco use, presenting to the ED with chest pain beginning around 7 AM this morning while at rest.  Pain is left chest, feels like needles, 9/10 currently, radiates under the left breast, worsens with exertion. Endorses persistent shortness of breath requiring constant use of home O2 at 2 L.  Denies syncope, dizziness, diaphoresis, lower extremity edema or pain, fever, acute cough, abdominal pain, or any other complaints.     Past Medical History:  Diagnosis Date  . Anterolisthesis    Grade 1, L4-5  . Bronchospasm 05/28/2012  . CHEST PAIN 11/18/2007  . DEGENERATIVE JOINT DISEASE 10/06/2006  . DEPRESSION 09/26/2008  . DIABETES MELLITUS 10/06/2006  . Diverticulosis    2229,[id number]  . GERD (gastroesophageal reflux disease) 07/25/2013  . HYPERLIPIDEMIA 01/11/2009  . HYPERTENSION 10/06/2006  . INSOMNIA 09/26/2008  . Internal hemorrhoids   . OBSTRUCTIVE SLEEP APNEA 06/23/2008   Severe OSA per sleep study 2010, Rx a CPAP  . Pain in joint, multiple sites 11/10/2006  . UNSPECIFIED ANEMIA 12/10/2009    Patient Active Problem List   Diagnosis Date Noted  . Shortness of breath   . Pericardial effusion   . Acute pulmonary edema (HCC)   . Acute respiratory failure with hypoxia ([place]) 10/20/2017  . Pain 10/19/2017  . Paresthesia 10/19/2017  . Weakness 10/19/2017  . Neck pain 10/19/2017  . PCP NOTES >>> 02/23/2015  . Nocturnal oxygen desaturation 01/02/2015  . Morbid obesity ([place]) 01/02/2015  . Asthma, chronic 01/02/2015  . GERD (gastroesophageal reflux disease) 07/25/2013  . DOE (dyspnea on exertion) 04/25/2013  . 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 Bronchospasm 05/28/2012  . Internal hemorrhoids without mention of complication 21/19/4174  . Annual physical exam 06/06/2011  . SKIN LESION 08/06/2009  . Hyperlipidemia 01/11/2009  . Depression 09/26/2008  . INSOMNIA 09/26/2008  . Obstructive sleep apnea 06/23/2008  . Chest pain 11/18/2007  . DM II (diabetes mellitus, type II), controlled ([place]) 10/06/2006  . Essential hypertension 10/06/2006  . Osteoarthritis 10/06/2006    Past Surgical History:  Procedure Laterality Date  . COLONOSCOPY  08/01/2011   Procedure: COLONOSCOPY;  Surgeon: Inda Castle, MD;  Location: [facility];  Service: Endoscopy;  Laterality: N/A;  . LEFT HEART CATH AND CORONARY ANGIOGRAPHY N/A 10/22/2017   Procedure: LEFT HEART CATH AND CORONARY ANGIOGRAPHY;  Surgeon: Jettie Booze, MD;  Location: [facility];  Service: Cardiovascular;  Laterality: N/A;  . 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 Right knee replacement  2005     OB History   None      Home Medications    Prior to Admission medications   Medication Sig Start Date End Date Taking? Authorizing Provider  albuterol (PROVENTIL HFA;VENTOLIN HFA) 108 (90 Base) MCG/ACT inhaler Inhale 2 puffs into the lungs every 4 (four) hours as needed for wheezing. 11/03/17 01/01/29 Yes Colon Branch, MD  aspirin EC 81 MG tablet Take 81 mg by mouth daily.   Yes [provider]  budesonide-formoterol (SYMBICORT) 160-4.5 MCG/ACT inhaler Inhale 2 puffs into the lungs 2 (two) times daily.  09/01/14  Yes Paz, Alda Berthold, MD  colchicine 0.6 MG tablet Take 1 tablet (0.6 mg total) by mouth 2 (two) times daily. 10/24/17  Yes Patrecia Pour, MD  diltiazem (CARDIZEM CD) 360 MG 24 hr capsule Take 1 capsule (360 mg total) by mouth daily. 09/30/17  Yes Paz, Alda Berthold, MD  Insulin Lispro Prot & Lispro (HUMALOG MIX 50/50 KWIKPEN) (50-50) 100 UNIT/ML Kwikpen Inject 5 Units into the skin 2 (two) times daily with a meal. And pen needles  2/day 10/24/17  Yes Patrecia Pour, MD  latanoprost (XALATAN) 0.005 % ophthalmic solution USE 1 DROP IN BOTH EYES AT BEDTIME 12/09/16  Yes [provider]  levothyroxine (SYNTHROID, LEVOTHROID) 75 MCG tablet Take 1 tablet (75 mcg total) by mouth daily before breakfast. 10/24/17  Yes Patrecia Pour, MD  metoprolol tartrate (LOPRESSOR) 25 MG tablet Take 1 tablet (25 mg total) by mouth 2 (two) times daily. 10/24/17  Yes Patrecia Pour, MD  OXYGEN Inhale 3 L into the lungs continuous.    Yes [provider]  pantoprazole (PROTONIX) 40 MG tablet Take 1 tablet (40 mg total) by mouth daily before breakfast. 11/02/17  Yes Paz, Alda Berthold, MD  potassium chloride (K-DUR,KLOR-CON) 10 MEQ tablet TAKE 1 TABLET(10 MEQ) BY MOUTH DAILY 10/15/17  Yes Paz, Alda Berthold, MD  rosuvastatin (CRESTOR) 40 MG tablet Take 1 tablet (40 mg total) by mouth daily. 04/30/17  Yes Paz, Alda Berthold, MD  telmisartan (MICARDIS) 40 MG tablet Take 1 tablet (40 mg total) by mouth daily. 06/08/17  Yes Colon Branch, MD  ACCU-CHEK AVIVA PLUS test strip TEST BLOD SUGAR TWICE DAILY AND LANCETS TWICE DAILY 10/15/17   Renato Shin, MD  ACCU-CHEK SOFTCLIX LANCETS lancets USE TO CHECK BLOOD SUGAR TWICE A DAY 10/05/17   Renato Shin, MD  BD ULTRA-FINE PEN NEEDLES 29G X 12.7MM MISC USE TWICE A DAY 10/27/17   Renato Shin, MD    Family History Family History  Problem Relation Age of Onset  . Asthma Mother   . Stroke Mother   . Diabetes Other        M, B, S  . Hypertension Sister        M, S,B  . Pancreatic cancer Brother   . Colon cancer Neg Hx   . Prostate cancer Neg Hx   . Breast cancer Neg Hx     Social History Social History   Tobacco Use  . Smoking status: Former Smoker    Packs/day: 0.20    Years: 4.00    Pack years: 0.80    Types: Cigarettes    Last attempt to quit: 05/13/1995    Years since quitting: 22.5  . Smokeless tobacco: Never Used  Substance Use Topics  . Alcohol use: Not Currently  . Drug use: No     Allergies     Patient has no known allergies.   Review of Systems Review of Systems  Constitutional: Negative for chills, diaphoresis and fever.  Respiratory: Positive for shortness of breath.   Cardiovascular: Positive for chest pain. Negative for leg swelling.  Gastrointestinal: Negative for abdominal pain, diarrhea, nausea and vomiting.  Musculoskeletal: Negative for back pain.  Neurological: Negative for dizziness and light-headedness.  All other systems reviewed and are negative.    Physical Exam Updated Vital Signs BP (!) 170/97   Pulse (!) 102   Temp 98.5 F (36.9 C) (Oral)   Resp 17   Ht '5\' 9"'$  (1.753 m)   Wt 126.6  kg (279 lb)   SpO2 99%   BMI 41.20 kg/m   Physical Exam  Constitutional: She appears well-developed and well-nourished. No distress.  HENT:  Head: Normocephalic and atraumatic.  Eyes: Conjunctivae are normal.  Neck: Neck supple.  Cardiovascular: Regular rhythm, normal heart sounds and intact distal pulses. Tachycardia present.  Pulmonary/Chest: Breath sounds normal. Tachypnea noted. She exhibits no tenderness.  Abdominal: Soft. There is no tenderness. There is no guarding.  Musculoskeletal: She exhibits no edema.  Lymphadenopathy:    She has no cervical adenopathy.  Neurological: She is alert.  Skin: Skin is warm and dry. She is not diaphoretic.  Psychiatric: She has a normal mood and affect. Her behavior is normal.  Nursing note and vitals reviewed.    ED Treatments / Results  Labs (all labs ordered are listed, but only abnormal results are displayed) Labs Reviewed  COMPREHENSIVE METABOLIC PANEL - Abnormal; Notable for the following components:      Result Value   Potassium 3.0 (*)    Glucose, Bld 104 (*)    Creatinine, Ser 1.38 (*)    Calcium 8.8 (*)    Albumin 3.1 (*)    AST 44 (*)    GFR calc non Af Amer 38 (*)    GFR calc Af Amer 44 (*)    All other components within normal limits  CBC WITH DIFFERENTIAL/PLATELET - Abnormal; Notable for the  following components:   Hemoglobin 11.4 (*)    MCV 76.9 (*)    MCH 23.7 (*)    RDW 20.2 (*)    All other components within normal limits  BRAIN NATRIURETIC PEPTIDE - Abnormal; Notable for the following components:   B Natriuretic Peptide 278.8 (*)    All other components within normal limits  I-STAT TROPONIN, ED    EKG EKG Interpretation  Date/Time:  Thursday November 12 2017 09:50:07 EDT Ventricular Rate:  115 PR Interval:  158 QRS Duration: 88 QT Interval:  342 QTC Calculation: 473 R Axis:   44 Text Interpretation:  Sinus tachycardia Otherwise normal ECG since last tracing no significant change Confirmed by Malvin Johns [phone]) on 11/12/2017 10:17:15 AM   Radiology Dg Chest 2 View  Result Date: 11/12/2017 CLINICAL DATA:  67 year old female with a history of left chest pain EXAM: CHEST - 2 VIEW COMPARISON:  11/02/2017, 10/22/2017, CT 10/20/2017 FINDINGS: Cardiomediastinal silhouette is unchanged in size and contour, enlarged. Low lung volumes. Interstitial opacities with no pneumothorax. Opacity in the costophrenic sulcus on the lateral view. No large confluent airspace disease. No displaced fracture. IMPRESSION: Similar appearance of interstitial opacities at the lung bases may reflect a combination of edema, atelectasis/consolidation, and/or chronic lung changes. Trace pleural effusions not excluded. Electronically Signed   By: Corrie Mckusick D.O.   On: 11/12/2017 10:23    Procedures Procedures (including critical care time)  Medications Ordered in ED Medications  nitroGLYCERIN (NITROSTAT) SL tablet 0.4 mg (has no administration in time range)  potassium chloride SA (K-DUR,KLOR-CON) CR tablet 40 mEq (40 mEq Oral Given 11/12/17 1135)  aspirin chewable tablet 324 mg (324 mg Oral Given 11/12/17 1138)  furosemide (LASIX) injection 40 mg (40 mg Intravenous Given 11/12/17 1306)     Initial Impression / Assessment and Plan / ED Course  I have reviewed the triage vital signs and the  nursing notes.  Pertinent labs & imaging results that were available during my care of the patient were reviewed by me and considered in my medical decision making (see chart for details).  Clinical Course as of Nov 13 1327  Thu Nov 12, 2017  1308 Spoke with Dr. Lorin Mercy, hospitalist. Agrees to admit the patient.   [SJ]    Clinical Course User Index [SJ] Jonee Lamore C, PA-C    Patient presents with chest pain that began this morning.  She has had progressively worsening shortness of breath over the last several days.  Chest pain resolved following aspirin administration.  Patient also had improvement in her tachypnea.  Worsening BNP with question of pulmonary edema on chest x-ray.  Admission for further work up.   Patient was seen and admitted on June 11 for chest pain and shortness of breath.  CT PE study negative for PE, however, small to moderate pericardial effusion noted and area of pulmonary edema versus multifocal pneumonia.  She underwent cardiac cath on June 13, which was free from CAD. Pulmonary consult obtained, ANA positive, CRP positive, and elevated ESR, which gave the impression that infectious etiology was less likely.  Etiology thought to be edema versus autoimmune.  She was diuresed with Lasix.  She was discharged June 15 on a 2-week regimen of colchicine and ibuprofen for treatment of pericarditis.  Findings and plan of care discussed with Malvin Johns, MD. Dr. Tamera Punt personally evaluated and examined this patient.  Vitals:   11/12/17 0953 11/12/17 1100 11/12/17 1115 11/12/17 1230  BP: 132/84 129/87 127/89 (!) 170/97  Pulse: (!) 115 (!) 105 (!) 102   Resp: (!) 21 (!) '24 16 17  '$ Temp: 98.5 F (36.9 C)     TempSrc: Oral     SpO2: 93% 99% 99%   Weight:      Height:         Final Clinical Impressions(s) / ED Diagnoses   Final diagnoses:  Atypical chest pain  Shortness of breath    ED Discharge Orders    None       Layla Maw 11/12/17 1331    Malvin Johns, MD 11/12/17 1450

## 2017-11-12 NOTE — Progress Notes (Addendum)
CRITICAL VALUE ALERT  Critical Value:  Troponin1 (0.04)  Date & Time Notied: 11/12/2017 1727  Provider Notified: Notified  Bodenheimer (NP) at [facility] via [place]  Orders Received/Actions taken: no order

## 2017-11-12 NOTE — H&P (Signed)
History and Physical    [name]:[id number] DOB: 08-Jan-1951 DOA: 11/12/2017  PCP: Colon Branch, MD Consultants:  [facility] - cardiology; [facility] - pulmonology; Loanne Drilling - endocrinology; Krista Blue - neurology Patient coming from:  Home - lives alone; NOK: Son, [phone]  Chief Complaint: left-sided CP  HPI: [name] is a 67 y.o. female with medical history significant of OSA on CPAP; HTN; HLD; and DM presenting with CP.  She is here "Just because of this pain I was having this morning.  It didn't start till this morning".  The pain was under her left breast.  She was getting out of the bed when it started.   Nothing made it better at home until she took ASA and potassium in the ER.  +SOB, has been in here for that too.  Has SOB with standing too long, walking, or getting upset.  +cough, usual for her.  She has been on O2 since April continuously.  She also has CPAP at home.  No edema.   She was intermittently belligerent, hostile, and emotionally labile during the evaluation and so further history was unable to be obtained.   ED Course:  Recent admission for similar presentation with both cardiology and pulmonology input.  Unremarkable echo 6/19 and cath but progressive SOB now requiring O2 24/7 instead of just qhs.  Review of Systems: As per HPI; otherwise review of systems reviewed and negative.   Ambulatory Status:  Ambulates with a cane or walker  Past Medical History:  Diagnosis Date  . Anterolisthesis    Grade 1, L4-5  . Bronchospasm 05/28/2012  . CHEST PAIN 11/18/2007  . DEGENERATIVE JOINT DISEASE 10/06/2006  . DEPRESSION 09/26/2008  . DIABETES MELLITUS 10/06/2006  . Diverticulosis    2025,4270  . GERD (gastroesophageal reflux disease) 07/25/2013  . HYPERLIPIDEMIA 01/11/2009  . HYPERTENSION 10/06/2006  . INSOMNIA 09/26/2008  . Internal hemorrhoids   . OBSTRUCTIVE SLEEP APNEA 06/23/2008   Severe OSA per sleep study 2010, Rx a CPAP  . Pain in joint, multiple sites 11/10/2006    . UNSPECIFIED ANEMIA 12/10/2009    Past Surgical History:  Procedure Laterality Date  . COLONOSCOPY  08/01/2011   Procedure: COLONOSCOPY;  Surgeon: Inda Castle, MD;  Location: [facility];  Service: Endoscopy;  Laterality: N/A;  . LEFT HEART CATH AND CORONARY ANGIOGRAPHY N/A 10/22/2017   Procedure: LEFT HEART CATH AND CORONARY ANGIOGRAPHY;  Surgeon: Jettie Booze, MD;  Location: [place] [facility];  Service: Cardiovascular;  Laterality: N/A;  . 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 Right knee replacement  2005    Social History   Socioeconomic History  . Marital status: Widowed    Spouse name: Not on file  . Number of children: 4  . Years of education: Not on file  . Highest education level: Not on file  Occupational History  . Occupation: disability    Employer: UNEMPLOYED  Social Needs  . Financial resource strain: Not on file  . Food insecurity:    Worry: Not on file    Inability: Not on file  . Transportation needs:    Medical: Not on file    Non-medical: Not on file  Tobacco Use  . Smoking status: Former Smoker    Packs/day: 0.20    Years: 4.00    Pack years: 0.80    Types: Cigarettes    Last attempt to quit: 05/13/1995    Years since quitting: 22.5  . Smokeless tobacco: Never Used  Substance and Sexual Activity  . Alcohol use: Not Currently  . Drug use: No  . Sexual activity: Not Currently  Lifestyle  . Physical activity:    Days per week: Not on file    Minutes per session: Not on file  . Stress: Not on file  Relationships  . Social connections:    Talks on phone: Not on file    Gets together: Not on file    Attends religious service: Not on file    Active member of club or organization: Not on file    Attends meetings of clubs or organizations: Not on file    Relationship status: Not on file  . Intimate partner violence:    Fear of current or ex partner: Not on file    Emotionally abused: Not on file    Physically abused: Not on file    Forced  sexual activity: Not on file  Other Topics Concern  . Not on file  Social History Narrative   Widow , lives by herself   Lost a son, 3 living    Lost husband    No Known Allergies  Family History  Problem Relation Age of Onset  . Asthma Mother   . Stroke Mother   . Diabetes Other        M, B, S  . Hypertension Sister        M, S,B  . Pancreatic cancer Brother   . Colon cancer Neg Hx   . Prostate cancer Neg Hx   . Breast cancer Neg Hx     Prior to Admission medications   Medication Sig Start Date End Date Taking? Authorizing Provider  albuterol (PROVENTIL HFA;VENTOLIN HFA) 108 (90 Base) MCG/ACT inhaler Inhale 2 puffs into the lungs every 4 (four) hours as needed for wheezing. 11/03/17 01/01/29 Yes Colon Branch, MD  aspirin EC 81 MG tablet Take 81 mg by mouth daily.   Yes [provider]  budesonide-formoterol (SYMBICORT) 160-4.5 MCG/ACT inhaler Inhale 2 puffs into the lungs 2 (two) times daily. 09/01/14  Yes Paz, Alda Berthold, MD  colchicine 0.6 MG tablet Take 1 tablet (0.6 mg total) by mouth 2 (two) times daily. 10/24/17  Yes Patrecia Pour, MD  diltiazem (CARDIZEM CD) 360 MG 24 hr capsule Take 1 capsule (360 mg total) by mouth daily. 09/30/17  Yes Paz, Alda Berthold, MD  Insulin Lispro Prot & Lispro (HUMALOG MIX 50/50 KWIKPEN) (50-50) 100 UNIT/ML Kwikpen Inject 5 Units into the skin 2 (two) times daily with a meal. And pen needles 2/day 10/24/17  Yes Patrecia Pour, MD  latanoprost (XALATAN) 0.005 % ophthalmic solution USE 1 DROP IN BOTH EYES AT BEDTIME 12/09/16  Yes [provider]  levothyroxine (SYNTHROID, LEVOTHROID) 75 MCG tablet Take 1 tablet (75 mcg total) by mouth daily before breakfast. 10/24/17  Yes Patrecia Pour, MD  metoprolol tartrate (LOPRESSOR) 25 MG tablet Take 1 tablet (25 mg total) by mouth 2 (two) times daily. 10/24/17  Yes Patrecia Pour, MD  OXYGEN Inhale 3 L into the lungs continuous.    Yes [provider]  pantoprazole (PROTONIX) 40 MG tablet Take 1  tablet (40 mg total) by mouth daily before breakfast. 11/02/17  Yes Paz, Alda Berthold, MD  potassium chloride (K-DUR,KLOR-CON) 10 MEQ tablet TAKE 1 TABLET(10 MEQ) BY MOUTH DAILY 10/15/17  Yes Paz, Alda Berthold, MD  rosuvastatin (CRESTOR) 40 MG tablet Take 1 tablet (40 mg total) by mouth daily. 04/30/17  Yes Paz, Baudette  E, MD  telmisartan (MICARDIS) 40 MG tablet Take 1 tablet (40 mg total) by mouth daily. 06/08/17  Yes Paz, Alda Berthold, MD  ACCU-CHEK AVIVA PLUS test strip TEST BLOD SUGAR TWICE DAILY AND LANCETS TWICE DAILY 10/15/17   Renato Shin, MD  ACCU-CHEK SOFTCLIX LANCETS lancets USE TO CHECK BLOOD SUGAR TWICE A DAY 10/05/17   Renato Shin, MD  BD ULTRA-FINE PEN NEEDLES 29G X 12.7MM MISC USE TWICE A DAY 10/27/17   Renato Shin, MD    Physical Exam: Vitals:   11/12/17 1115 11/12/17 1230 11/12/17 1330 11/12/17 1457  BP: 127/89 (!) 170/97 (!) 189/102   Pulse: (!) 102  (!) 104   Resp: 16 17 (!) 30   Temp:      TempSrc:      SpO2: 99%  97%   Weight:    118.4 kg (261 lb 0.4 oz)  Height:    5\' 9"  (1.753 m)     General:  Appears calm and comfortable and is NAD Eyes:  PERRL, EOMI, normal lids, iris ENT:  grossly normal hearing, lips & tongue, mmm Neck:  no LAD, masses or thyromegaly; no carotid bruits Cardiovascular:  RR with mild tachycardia, no m/r/g.  Respiratory:   CTA bilaterally other than bibasilar crackles.  Normal to mildly increased respiratory effort. Abdomen: soft, NT, ND, NABS Skin: no rash or induration seen on limited exam Musculoskeletal:  grossly normal tone BUE/BLE, good ROM, no bony abnormality Psychiatric:  cycling mood and affect but generally hostile, belligerent, or emotionally labile, speech fluent and appropriate, AOx3 Neurologic:  CN 2-12 grossly intact, moves all extremities in coordinated fashion, sensation intact    Radiological Exams on Admission: Dg Chest 2 View  Result Date: 11/12/2017 CLINICAL DATA:  67 year old female with a history of left chest pain EXAM: CHEST - 2 VIEW  COMPARISON:  11/02/2017, 10/22/2017, CT 10/20/2017 FINDINGS: Cardiomediastinal silhouette is unchanged in size and contour, enlarged. Low lung volumes. Interstitial opacities with no pneumothorax. Opacity in the costophrenic sulcus on the lateral view. No large confluent airspace disease. No displaced fracture. IMPRESSION: Similar appearance of interstitial opacities at the lung bases may reflect a combination of edema, atelectasis/consolidation, and/or chronic lung changes. Trace pleural effusions not excluded. Electronically Signed   By: Corrie Mckusick D.O.   On: 11/12/2017 10:23    EKG: Independently reviewed.  Sinus tachycardia with rate 115; nonspecific ST changes with no evidence of acute ischemia   Labs on Admission: I have personally reviewed the available labs and imaging studies at the time of the admission.  Pertinent labs:   K+ 3.0 Creatinine 1.38, GFR 38 Albumin 31 BNP 278.8 Troponin 0.04 WBC 5.2 Hgb 11.4 - stable   Assessment/Plan Principal Problem:   Acute on chronic respiratory failure with hypoxia (HCC) Active Problems:   DM II (diabetes mellitus, type II), controlled ([place])   Obstructive sleep apnea   Essential hypertension   Chest pain   Acute on chronic respiratory failure -Patient with recent similar admission from 6/11-15 -At that time, cardiology was consulted and she had a pericardial effusion with +ANA and so was started on colchicine for pericarditis. -She had a cardiac cath was performed and negative for ischemia -Echo was performed and was also normal other than the mild effusion. -Her symptoms improved with treatment of pericarditis and so this was thought to be the cause. -Pulmonology also saw her during that admission and recommended outpatient rheumatology evaluation as well as consideration of BAL and possibly VATS biopsy if she continued  to have bibasilar infiltrates due to concern for possible immune inflammatory disease. -She returns today with  progressive SOB now requiring 24/7 O2 and acute left-sided chest pain. -Will observe for now on telemetry with cardiac r/o - but this is exceedingly unlikely as the cause given a negative cath 1 month ago. -Instead, this appears to be increasingly likely to be related to her pulmonary issue.   -Pulm consult requested, Dr. Pearline Cables agrees to see the patient. -Albuterol nebs prn. -[place] O2 prn.  HTN -Continue home meds - Cardizem, Lopressor, Telmisartan  DM -Recent  A1c 5.6, demonstrating good control -hold 50/50 insulin - she takes little of this, and we do not carry it on forumlary -Cover with moderate-scale SSI  OSA -Severe OSA according to [place] -Continue CPAP -Patient does not know how setting so will use autopap/RT assistance  Hypothyroidism -Elevated TSH on both of the most recent tests -Will check TSH and free T4 -Continue home Synthroid dose for now  *NOTE: the patient is quite insistent that she desires to be discharged tomorrow.   DVT prophylaxis:   Lovenox  Code Status:  Full - confirmed with patient Family Communication: None present Disposition Plan:  Home once clinically improved Consults called: Pulmonology  Admission status: It is my clinical opinion that referral for OBSERVATION is reasonable and necessary in this patient based on the above information provided. The aforementioned taken together are felt to place the patient at high risk for further clinical deterioration. However it is anticipated that the patient may be medically stable for discharge from the hospital within 24 to 48 hours.    Karmen Bongo MD [facility]  If note is complete, please contact covering daytime or nighttime physician. [email or web address] Password [email or web address]  11/12/2017, 3:18 PM

## 2017-11-13 ENCOUNTER — Other Ambulatory Visit: Payer: Self-pay

## 2017-11-13 ENCOUNTER — Observation Stay (HOSPITAL_COMMUNITY): Payer: Medicare Other

## 2017-11-13 ENCOUNTER — Ambulatory Visit: Payer: Self-pay

## 2017-11-13 DIAGNOSIS — R109 Unspecified abdominal pain: Secondary | ICD-10-CM | POA: Diagnosis not present

## 2017-11-13 DIAGNOSIS — J9621 Acute and chronic respiratory failure with hypoxia: Secondary | ICD-10-CM | POA: Diagnosis not present

## 2017-11-13 DIAGNOSIS — I10 Essential (primary) hypertension: Secondary | ICD-10-CM | POA: Diagnosis not present

## 2017-11-13 DIAGNOSIS — G4733 Obstructive sleep apnea (adult) (pediatric): Secondary | ICD-10-CM

## 2017-11-13 DIAGNOSIS — E1149 Type 2 diabetes mellitus with other diabetic neurological complication: Secondary | ICD-10-CM | POA: Diagnosis not present

## 2017-11-13 DIAGNOSIS — R918 Other nonspecific abnormal finding of lung field: Secondary | ICD-10-CM

## 2017-11-13 DIAGNOSIS — Z794 Long term (current) use of insulin: Secondary | ICD-10-CM | POA: Diagnosis not present

## 2017-11-13 DIAGNOSIS — R1310 Dysphagia, unspecified: Secondary | ICD-10-CM | POA: Diagnosis not present

## 2017-11-13 LAB — CBC
HCT: 36.6 % (ref 36.0–46.0)
Hemoglobin: 11.1 g/dL — ABNORMAL LOW (ref 12.0–15.0)
MCH: 23.5 pg — ABNORMAL LOW (ref 26.0–34.0)
MCHC: 30.3 g/dL (ref 30.0–36.0)
MCV: 77.4 fL — ABNORMAL LOW (ref 78.0–100.0)
PLATELETS: 323 10*3/uL (ref 150–400)
RBC: 4.73 MIL/uL (ref 3.87–5.11)
RDW: 19.9 % — AB (ref 11.5–15.5)
WBC: 4.3 10*3/uL (ref 4.0–10.5)

## 2017-11-13 LAB — HEPATIC FUNCTION PANEL
ALBUMIN: 3.1 g/dL — AB (ref 3.5–5.0)
ALK PHOS: 58 U/L (ref 38–126)
ALT: 21 U/L (ref 0–44)
AST: 36 U/L (ref 15–41)
BILIRUBIN INDIRECT: 0.7 mg/dL (ref 0.3–0.9)
BILIRUBIN TOTAL: 0.9 mg/dL (ref 0.3–1.2)
Bilirubin, Direct: 0.2 mg/dL (ref 0.0–0.2)
TOTAL PROTEIN: 8.4 g/dL — AB (ref 6.5–8.1)

## 2017-11-13 LAB — GLUCOSE, CAPILLARY
GLUCOSE-CAPILLARY: 135 mg/dL — AB (ref 70–99)
GLUCOSE-CAPILLARY: 166 mg/dL — AB (ref 70–99)

## 2017-11-13 LAB — BASIC METABOLIC PANEL
Anion gap: 11 (ref 5–15)
BUN: 10 mg/dL (ref 8–23)
CALCIUM: 8.8 mg/dL — AB (ref 8.9–10.3)
CO2: 27 mmol/L (ref 22–32)
CREATININE: 1.17 mg/dL — AB (ref 0.44–1.00)
Chloride: 102 mmol/L (ref 98–111)
GFR calc Af Amer: 54 mL/min — ABNORMAL LOW (ref >60)
GFR calc non Af Amer: 47 mL/min — ABNORMAL LOW (ref >60)
Glucose, Bld: 101 mg/dL — ABNORMAL HIGH (ref 70–99)
POTASSIUM: 3.5 mmol/L (ref 3.5–5.1)
SODIUM: 140 mmol/L (ref 135–145)

## 2017-11-13 LAB — LIPASE, BLOOD: LIPASE: 45 U/L (ref 11–51)

## 2017-11-13 LAB — TSH: TSH: 6.939 u[IU]/mL — ABNORMAL HIGH (ref 0.350–4.500)

## 2017-11-13 LAB — TROPONIN I: TROPONIN I: 0.04 ng/mL — AB (ref <0.03)

## 2017-11-13 MED ORDER — MORPHINE SULFATE (PF) 2 MG/ML IV SOLN
2.0000 mg | Freq: Once | INTRAVENOUS | Status: AC
  Administered 2017-11-13: 2 mg via INTRAVENOUS
  Filled 2017-11-13: qty 1

## 2017-11-13 MED ORDER — ALUM & MAG HYDROXIDE-SIMETH 200-200-20 MG/5ML PO SUSP
30.0000 mL | ORAL | Status: DC | PRN
  Administered 2017-11-13: 30 mL via ORAL
  Filled 2017-11-13: qty 30

## 2017-11-13 NOTE — Consult Note (Signed)
PULMONARY / CRITICAL CARE MEDICINE   Name: [name] MRN: [id number] DOB: January 31, 1951    ADMISSION DATE:  11/12/2017 CONSULTATION DATE: November 13, 2017  REFERRING MD: Florene Glen  CHIEF COMPLAINT: Dyspnea, chest pain  HISTORY OF PRESENT ILLNESS:   67 year old female with a past medical history significant for pericarditis diagnosed in June 2019 returns to the hospital in the setting of worsening chest pain and shortness of breath over the course of several weeks.  Interestingly, when she was seen by the pulmonary service in June 2019 she had evidence of groundglass opacification in her lungs and blood work was collected at that time which was positive for ANA, anti-chromatin antibody, and anti-RNP antibody.  She tells me that she thinks she started using oxygen continuously in the spring 2019.  In the last note from Dr. Halford Chessman in August 2018 he indicated that he thought she could probably stop using oxygen at that time.  She tells me that she has been having some trouble swallowing recently.  She says that sometimes pills will get stuck.  Apart from that she denies fevers, chills, mucus production or hemoptysis.  She denies any joint aches or swelling or an unusual rash.  PAST MEDICAL HISTORY :  She  has a past medical history of Anterolisthesis, Bronchospasm (05/28/2012), CHEST PAIN (11/18/2007), DEGENERATIVE JOINT DISEASE (10/06/2006), DEPRESSION (09/26/2008), DIABETES MELLITUS (10/06/2006), Diverticulosis, GERD (gastroesophageal reflux disease) (07/25/2013), HYPERLIPIDEMIA (01/11/2009), HYPERTENSION (10/06/2006), INSOMNIA (09/26/2008), Internal hemorrhoids, OBSTRUCTIVE SLEEP APNEA (06/23/2008), Pain in joint, multiple sites (11/10/2006), and UNSPECIFIED ANEMIA (12/10/2009).  PAST SURGICAL HISTORY: She  has a past surgical history that includes Right knee replacement (2005); Left knee replacement (07/2007); Colonoscopy (08/01/2011); and LEFT HEART CATH AND CORONARY ANGIOGRAPHY (N/A, 10/22/2017).  No Known  Allergies  No current facility-administered medications on file prior to encounter.    Current Outpatient Medications on File Prior to Encounter  Medication Sig  . albuterol (PROVENTIL HFA;VENTOLIN HFA) 108 (90 Base) MCG/ACT inhaler Inhale 2 puffs into the lungs every 4 (four) hours as needed for wheezing.  Marland Kitchen aspirin EC 81 MG tablet Take 81 mg by mouth daily.  . budesonide-formoterol (SYMBICORT) 160-4.5 MCG/ACT inhaler Inhale 2 puffs into the lungs 2 (two) times daily.  . colchicine 0.6 MG tablet Take 1 tablet (0.6 mg total) by mouth 2 (two) times daily.  Marland Kitchen diltiazem (CARDIZEM CD) 360 MG 24 hr capsule Take 1 capsule (360 mg total) by mouth daily.  . Insulin Lispro Prot & Lispro (HUMALOG MIX 50/50 KWIKPEN) (50-50) 100 UNIT/ML Kwikpen Inject 5 Units into the skin 2 (two) times daily with a meal. And pen needles 2/day  . latanoprost (XALATAN) 0.005 % ophthalmic solution USE 1 DROP IN BOTH EYES AT BEDTIME  . levothyroxine (SYNTHROID, LEVOTHROID) 75 MCG tablet Take 1 tablet (75 mcg total) by mouth daily before breakfast.  . metoprolol tartrate (LOPRESSOR) 25 MG tablet Take 1 tablet (25 mg total) by mouth 2 (two) times daily.  . OXYGEN Inhale 3 L into the lungs continuous.   . pantoprazole (PROTONIX) 40 MG tablet Take 1 tablet (40 mg total) by mouth daily before breakfast.  . potassium chloride (K-DUR,KLOR-CON) 10 MEQ tablet TAKE 1 TABLET(10 MEQ) BY MOUTH DAILY  . rosuvastatin (CRESTOR) 40 MG tablet Take 1 tablet (40 mg total) by mouth daily.  Marland Kitchen telmisartan (MICARDIS) 40 MG tablet Take 1 tablet (40 mg total) by mouth daily.  Marland Kitchen ACCU-CHEK AVIVA PLUS test strip TEST BLOD SUGAR TWICE DAILY AND LANCETS TWICE DAILY  . ACCU-CHEK SOFTCLIX LANCETS  lancets USE TO CHECK BLOOD SUGAR TWICE A DAY  . BD ULTRA-FINE PEN NEEDLES 29G X 12.7MM MISC USE TWICE A DAY    FAMILY HISTORY:  Her indicated that her mother is deceased. She indicated that her father is deceased. She indicated that the status of her sister is  unknown. She indicated that the status of her brother is unknown. She indicated that her maternal grandmother is deceased. She indicated that her maternal grandfather is deceased. She indicated that her paternal grandmother is deceased. She indicated that her paternal grandfather is deceased. She indicated that the status of her neg hx is unknown. She indicated that the status of her other is unknown.   SOCIAL HISTORY: She  reports that she quit smoking about 22 years ago. Her smoking use included cigarettes. She has a 0.80 pack-year smoking history. She has never used smokeless tobacco. She reports that she drank alcohol. She reports that she does not use drugs.  REVIEW OF SYSTEMS:   Gen: Denies fever, chills, weight change, fatigue, night sweats HEENT: Denies blurred vision, double vision, hearing loss, tinnitus, sinus congestion, rhinorrhea, sore throat, neck stiffness, dysphagia PULM Per HPIchest pain, edema, orthopnea, paroxysmal nocturnal dyspnea, palpitations GI: Denies abdominal pain, nausea, vomiting, diarrhea, hematochezia, melena, constipation, change in bowel habits GU: Denies dysuria, hematuria, polyuria, oliguria, urethral discharge Endocrine: Denies hot or cold intolerance, polyuria, polyphagia or appetite change Derm: Denies rash, dry skin, scaling or peeling skin change Heme: Denies easy bruising, bleeding, bleeding gums Neuro: Denies headache, numbness, weakness, slurred speech, loss of memory or consciousness   SUBJECTIVE:  As above  VITAL SIGNS: BP (!) 158/80 (BP Location: Right Arm)   Pulse 100   Temp 98.3 F (36.8 C) (Oral)   Resp 20   Ht 5\' 9"  (1.753 m)   Wt 118.4 kg (261 lb 0.4 oz)   SpO2 92%   BMI 38.55 kg/m   HEMODYNAMICS:    VENTILATOR SETTINGS:    INTAKE / OUTPUT: I/O last 3 completed shifts: In: 240 [P.O.:240] Out: 2000 [Urine:2000]  PHYSICAL EXAMINATION:  General:  Resting comfortably in bed HENT: NCAT OP clear PULM: Crackles R base more  than left B, normal effort CV: RRR, no mgr GI: BS+, soft, nontender MSK: normal bulk and tone Neuro: awake, alert, no distress, MAEW   LABS:  BMET Recent Labs  Lab 11/12/17 1004 11/13/17 0321  NA 140 140  K 3.0* 3.5  CL 105 102  CO2 23 27  BUN 10 10  CREATININE 1.38* 1.17*  GLUCOSE 104* 101*    Electrolytes Recent Labs  Lab 11/12/17 1004 11/13/17 0321  CALCIUM 8.8* 8.8*    CBC Recent Labs  Lab 11/12/17 1004 11/13/17 0321  WBC 5.2 4.3  HGB 11.4* 11.1*  HCT 37.0 36.6  PLT 347 323    Coag's No results for input(s): APTT, INR in the last 168 hours.  Sepsis Markers No results for input(s): LATICACIDVEN, PROCALCITON, O2SATVEN in the last 168 hours.  ABG No results for input(s): PHART, PCO2ART, PO2ART in the last 168 hours.  Liver Enzymes Recent Labs  Lab 11/12/17 1004  AST 44*  ALT 24  ALKPHOS 58  BILITOT 0.9  ALBUMIN 3.1*    Cardiac Enzymes Recent Labs  Lab 11/12/17 1537 11/12/17 2049 11/13/17 0321  TROPONINI 0.04* 0.03* 0.04*    Glucose Recent Labs  Lab 11/12/17 1647 11/12/17 2141 11/13/17 0745  GLUCAP 69* 122* 135*    Imaging Dg Abd Portable 1v  Result Date: 11/13/2017 CLINICAL DATA:  Abdomen pain EXAM: PORTABLE ABDOMEN - 1 VIEW COMPARISON:  Chest x-ray 11/12/2017 FINDINGS: Mild gaseous enlargement of central small bowel. Scattered colon gas is present. Calcified phleboliths in the pelvis. IMPRESSION: Mild gaseous enlargement of central small bowel but with colon gas present. Findings could be secondary to mild ileus. If developing or partial bowel obstruction is suspected, radiographic follow-up could be obtained. Electronically Signed   By: Donavan Foil M.D.   On: 11/13/2017 04:04     STUDIES:  Twelve-lead EKG from this admission reviewed showing no ischemic changes Chest x-ray images from November 12, 2017 personally reviewed showing bilateral airspace disease in the bases, cardiomegaly  CULTURES:   ANTIBIOTICS:   SIGNIFICANT  EVENTS:   LINES/TUBES:   DISCUSSION: 67 year old female with a past medical history significant for hypertension, diabetes, asthma, and obstructive sleep apnea who was recently diagnosed with pericardial effusion in the setting of a positive ANA, positive chromatin antibody, and positive RNP antibody comes back to our hospital for evaluation of chest pain and dyspnea.  At this time she appears to be at her baseline as she tells me that her breathing has improved.  She has been using 3 L of oxygen continuously for the last several months.  She has bilateral airspace disease in the bases of her lungs.  Given the distribution and her clinical history I am suspicious of drug-induced lupus pneumonitis.  I am not sure how long she is been on the diltiazem but this drug has been implicated in drug-induced lupus and I think that could be a unifying diagnosis considering the pericardial effusion she had several months ago and the positive anti-chromatin antibody.  I will repeat an antihistone antibody.  The differential diagnosis includes chronic aspiration as she does report some trouble swallowing so we will investigate that.  I think it is less likely that this represents something like hemoptysis or organizing pneumonia or nonspecific interstitial pneumonitis.   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

## 2017-11-13 NOTE — Care Management Note (Addendum)
Case Management Note  Patient Details  Name: DYLIN IHNEN MRN: [id number] Date of Birth: 04-01-1951  Subjective/Objective:                Patient in observation, will resume HHA w Nanine Means, no orders needed unless changed to inpatient. Has home oxygen and portable tank for DC at bedside. MD added [facility] RN to order, notified Rudene Christians.  No other CM needs.     Action/Plan:   Expected Discharge Date:                  Expected Discharge Plan:  [place]  In-House Referral:     Discharge planning Services  CM Consult  Post Acute Care Choice:  Resumption of Svcs/PTA Provider Choice offered to:     DME Arranged:    DME Agency:     HH Arranged:  Nurse's Aide [place] Agency:  [facility]  Status of Service:  Completed, signed off  If discussed at [place] of Stay Meetings, dates discussed:    Additional Comments:  Carles Collet, RN 11/13/2017, 3:48 PM

## 2017-11-13 NOTE — Care Management Obs Status (Signed)
[place] NOTIFICATION   Patient Details  Name: [name] MRN: [id number] Date of Birth: January 23, 1951   Medicare Observation Status Notification Given:  Yes    Carles Collet, RN 11/13/2017, 3:32 PM

## 2017-11-13 NOTE — Progress Notes (Signed)
Patient woke up with  complaints of chest pain 10/10 under left breast radiating to abdomen.  EKG was obtained, nitroglycerin was given x1 with no improvement as well as albuterol treatment  Bodenheimer,NP notified  New orders: Morphine 1 mg  Mild chest pain improvement, continues to complain of abdominal pain 8/10  New orders: 1 mg of Morphine and abdominal xray

## 2017-11-13 NOTE — Progress Notes (Signed)
Patient declines CPAP at this time, no distress noted RCP will continue to follow.

## 2017-11-13 NOTE — Progress Notes (Signed)
Patient discharged home with transport provided by niece. Patient is very non-compliant and has been pushing her discharge today. Patient is very reluctant to give me any information, including who would be providing transportation home. Patients portable home oxygen tank was empty when checked. Patient refused waiting for a replacement take.

## 2017-11-13 NOTE — Discharge Summary (Signed)
Physician Discharge Summary  [name] EUM:[id number] DOB: February 11, 1951 DOA: 11/12/2017  PCP: Colon Branch, MD  Admit date: 11/12/2017 Discharge date: 11/13/2017  Time spent: 40 minutes  Recommendations for Outpatient Follow-up:  1. Follow up outpatient CBC/CMP 2. Follow up with pulmonology as outpatient 3. Ensure she's discontinued diltiazem 4. Follow up CT scan in 8 weeks - consider bronchoscopy at that time if no improvement in infiltrate and oxygentation 5. Follow up serum histone antibody 6. Ensure rheumatology follow up 7. Ensure continued compliance with colchicine 8. Follow up barium swallow here (nonspecific esophageal motility disorder), ensure follow up with GI 9. Consider repeat abdominal films 10. Follow up repeat TSH as outpatient after a few weeks 11. Follow mood, odd affect here (personality d/o?), consider outpatient behavioral health/psych if agreeable   Discharge Diagnoses:  Principal Problem:   Acute on chronic respiratory failure with hypoxia ([place]) Active Problems:   DM II (diabetes mellitus, type II), controlled ([name])   Obstructive sleep apnea   Essential hypertension   Chest pain   Discharge Condition: stable  Filed Weights   11/12/17 0951 11/12/17 1457  Weight: 126.6 kg (279 lb) 118.4 kg (261 lb 0.4 oz)    History of present illness:  Per HPI [name] a 67 y.o.femalewith medical history significant ofOSA on CPAP; HTN; HLD; and DM presenting with CP.She is here "Just because of this pain I was having this morning. It didn't start till this morning". The pain was under her left breast. She was getting out of the bed when it started. Nothing made it better at home until she took ASA and potassium in the ER. +SOB, has been in here for that too. Has SOB with standing too long, walking, or getting upset. +cough, usual for her. She has been on O2 since April continuously. She also has CPAP at home. No edema. She was  intermittently belligerent, hostile, and emotionally labile during the evaluation and so further history was unable to be obtained.  She was admitted for her chest pain and chronic respiratory failure.  She's on 3 L O2 at baseline and she seemed to be on this requirement throughout her admission.  Her troponins were flat and her EKG was not concerning for ACS.  She had recent cath 10/2017 without evidence of CAD.  Her CP had resolved on hospital day 1.  Her respiratory status was also at baseline on hospital day 1 on my evaluation.  She was seen by pulmonology given her possible inflammatory lung disease.  Positive ANA, RNP, anti smith and chromatin.  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  Pulmonology recommending discontinuing diltiazem as possible causative agent.  Recommending repeat chest CT in 8 weeks and if no improvement, consider bronch.  Also recommended following up barium swallow.    She was discharged in the afternoon, but had been inpatient regarding her discharge throughout the day.  I had to talk her into staying for barium swallow.  Her portable O2 tank was noted to be low (I was able to hear O2 flowing when she turned it on) and we offered to get replacement tank, but she declined this and noted she didn't want to wait any longer and it wouldn't take her long to get home.  She noted she had spare tank at home she could use.  She refused to sign discharge paperwork after she left.  Hospital Course:  Acute on chronic respiratory failure  Chest Pain -Patient with recent similar admission from  6/11-15 -At that time, cardiology was consulted and she had a pericardial effusion with +ANA and so was started on colchicine for pericarditis. -She had a cardiac cath was performed and negative for ischemia -Echo was performed and was also normal other than the mild effusion. -Her symptoms improved with treatment of pericarditis and so this was thought to be the  cause. -Pulmonology also saw her during that admission and recommended outpatient rheumatology evaluation as well as consideration of BAL and possibly VATS biopsy if she continued to have bibasilar infiltrates due to concern for possible immune inflammatory disease. -Pt returned with recurrent chest pain with abdominal pain as well.  She's on her 3 L [name] which seems to be chronic for her - At this point, she notes her symptoms have resolved. - troponins flat and negative cath last visit, unlikely ischemia as etiology.  EKG without concerning findings. - Pulm to see her today, appreciate recs -> recommending discontinuing diltiazem given possibility of drug induced lupus as noted above.  Follow serum histone antiody, follow CT chest in 8 weeks (see note).   - given resolution of her symptoms, will plan for afternoon d/c - continue colchicine for pericarditis  Dysphagia: swallow eval recommended by pulm, barium swallow with nonspecific esophageal motility disorder.  Will need outpatient f/u with GI.  Abdominal Pain:  Plain film from last night with mild gaseous enlargement of central owel, ? Mild ileus or developing partial bowel obstruction, but pt reports bowel movement this morning.  Would continue to monitor, f/u imaging as indicated (will add on lipase, HFP - within normal limits).  This has now resolved.  HTN -Continue home meds - Cardizem, Lopressor, Telmisartan (formulary substituted arb)  DM -resume home meds  OSA -Severe OSA according to [place] -Continue CPAP  Hypothyroidism -Elevated TSH, but trending in right direction - follow repeat outpatient, continue synthroid  ? Personality disorder vs mood:  Pt reportedly emotionally labile during admission H&P. She was extremely focused on discharge and going home (threw her food down when nursing discussed not eating before the barium swallow and I had to come down and discuss staying to complete this test - she was agreeable after  this).  Denied sx of depression/psychosis to me, but didn't provide nurse with information regarding transportation home (some paranoid behavior).  Recommend outpatient behavioral health/psych if she's agreeable.   Procedures:  none  Consultations:  pulmonology  Discharge Exam: Vitals:   11/13/17 0726 11/13/17 0746  BP:  (!) 158/80  Pulse:  100  Resp:  20  Temp:  98.3 F (36.8 C)  SpO2: 92%    Feeling better. CP and abdominal discomfort have resolved.  General exam: Appears calm and comfortable  Respiratory system: Clear to auscultation. Crackles at the bases. Cardiovascular system: S1 & S2 heard, RRR.  Gastrointestinal system: Abdomen is nondistended, soft and nontender. Central nervous system: Alert and oriented. No focal neurological deficits. Extremities: Symmetric 5 x 5 power. Skin: No rashes, lesions or ulcers Psychiatry: Judgement and insight appear normal. Mood & affect appropriate.    Discharge Instructions   Discharge Instructions    Call MD for:  difficulty breathing, headache or visual disturbances   Complete by:  As directed    Call MD for:  extreme fatigue   Complete by:  As directed    Call MD for:  hives   Complete by:  As directed    Call MD for:  persistant dizziness or light-headedness   Complete by:  As directed  Call MD for:  persistant nausea and vomiting   Complete by:  As directed    Call MD for:  redness, tenderness, or signs of infection (pain, swelling, redness, odor or green/yellow discharge around incision site)   Complete by:  As directed    Call MD for:  severe uncontrolled pain   Complete by:  As directed    Call MD for:  temperature >100.4   Complete by:  As directed    Diet - low sodium heart healthy   Complete by:  As directed    Discharge instructions   Complete by:  As directed    You were seen for shortness of breath and chest discomfort.  You were seen by pulmonology who thinks your symptoms may be related to drug  induced lupus.  Please stop your dilitiazem.  Please follow up with pulmonology in the next few weeks.  Please follow up with your primary care doctor.  You esophagram showed evidence of a nonspecific esophageal dysmotility.  Please follow up with your PCP to discuss following up with GI as an outpatient.   Follow up labs with your PCP, your TSH (thyroid test) is trending in the right direction, but not yet within normal limits and should be repeated as an outpatient.   Return for new, recurrent, or worsening symptoms.  Please ask your PCP to request records from this hospitalization so they know what was done and what the next steps will be.   Increase activity slowly   Complete by:  As directed      Allergies as of 11/13/2017   No Known Allergies     Medication List    STOP taking these medications   diltiazem 360 MG 24 hr capsule Commonly known as:  CARDIZEM CD     TAKE these medications   ACCU-CHEK AVIVA PLUS test strip Generic drug:  glucose blood TEST BLOD SUGAR TWICE DAILY AND LANCETS TWICE DAILY   ACCU-CHEK SOFTCLIX LANCETS lancets USE TO CHECK BLOOD SUGAR TWICE A DAY   albuterol 108 (90 Base) MCG/ACT inhaler Commonly known as:  PROVENTIL HFA;VENTOLIN HFA Inhale 2 puffs into the lungs every 4 (four) hours as needed for wheezing.   aspirin EC 81 MG tablet Take 81 mg by mouth daily.   BD ULTRA-FINE PEN NEEDLES 29G X 12.7MM Misc Generic drug:  Insulin Pen Needle USE TWICE A DAY   budesonide-formoterol 160-4.5 MCG/ACT inhaler Commonly known as:  SYMBICORT Inhale 2 puffs into the lungs 2 (two) times daily.   colchicine 0.6 MG tablet Take 1 tablet (0.6 mg total) by mouth 2 (two) times daily.   Insulin Lispro Prot & Lispro (50-50) 100 UNIT/ML Kwikpen Commonly known as:  HUMALOG MIX 50/50 KWIKPEN Inject 5 Units into the skin 2 (two) times daily with a meal. And pen needles 2/day   latanoprost 0.005 % ophthalmic solution Commonly known as:  XALATAN USE 1 DROP IN  BOTH EYES AT BEDTIME   levothyroxine 75 MCG tablet Commonly known as:  SYNTHROID, LEVOTHROID Take 1 tablet (75 mcg total) by mouth daily before breakfast.   metoprolol tartrate 25 MG tablet Commonly known as:  LOPRESSOR Take 1 tablet (25 mg total) by mouth 2 (two) times daily.   OXYGEN Inhale 3 L into the lungs continuous.   pantoprazole 40 MG tablet Commonly known as:  PROTONIX Take 1 tablet (40 mg total) by mouth daily before breakfast.   potassium chloride 10 MEQ tablet Commonly known as:  K-DUR,KLOR-CON TAKE 1 TABLET(10 MEQ)  BY MOUTH DAILY   rosuvastatin 40 MG tablet Commonly known as:  CRESTOR Take 1 tablet (40 mg total) by mouth daily.   telmisartan 40 MG tablet Commonly known as:  MICARDIS Take 1 tablet (40 mg total) by mouth daily.      No Known Allergies Follow-up Information    Colon Branch, MD Follow up.   Specialty:  Internal Medicine Contact information: [address] [place] [address] [phone]        Sueanne Margarita, MD .   Specialty:  Cardiology Contact information: [address] [place] [address] [phone]        Chesley Mires, MD Follow up.   Specialty:  Pulmonary Disease Why:  Call for follow up appointment Contact information: [address] [place] [address] [phone]            The results of significant diagnostics from this hospitalization (including imaging, microbiology, ancillary and laboratory) are listed below for reference.    Significant Diagnostic Studies: Dg Chest 2 View  Result Date: 11/12/2017 CLINICAL DATA:  67 year old female with a history of left chest pain EXAM: CHEST - 2 VIEW COMPARISON:  11/02/2017, 10/22/2017, CT 10/20/2017 FINDINGS: Cardiomediastinal silhouette is unchanged in size and contour, enlarged. Low lung volumes. Interstitial opacities with no pneumothorax. Opacity in the costophrenic sulcus on the lateral view. No large confluent airspace  disease. No displaced fracture. IMPRESSION: Similar appearance of interstitial opacities at the lung bases may reflect a combination of edema, atelectasis/consolidation, and/or chronic lung changes. Trace pleural effusions not excluded. Electronically Signed   By: Corrie Mckusick D.O.   On: 11/12/2017 10:23   Dg Chest 2 View  Result Date: 11/02/2017 CLINICAL DATA:  Increasing shortness of breath and chest pain EXAM: CHEST - 2 VIEW COMPARISON:  10/22/2017 FINDINGS: Cardiomegaly is again identified. Mild vascular congestion is again seen with mild interstitial edema. No focal confluent infiltrate or sizable effusion is noted. IMPRESSION: Changes of mild CHF Electronically Signed   By: Inez Catalina M.D.   On: 11/02/2017 13:59   Dg Chest 2 View  Result Date: 10/20/2017 CLINICAL DATA:  Chest pain and shortness of breath EXAM: CHEST - 2 VIEW COMPARISON:  Sep 11, 2017 FINDINGS: Cardiomegaly. Mild edema. Mild patchy opacity in the lateral left lung base is more prominent the interval. No other interval changes. IMPRESSION: 1. Cardiomegaly and mild edema. 2. Mild patchy opacity in the lateral left lung base. Recommend follow-up to resolution. Electronically Signed   By: Dorise Bullion III M.D   On: 10/20/2017 00:03   Ct Angio Chest Pe W And/or Wo Contrast  Result Date: 10/20/2017 CLINICAL DATA:  Shortness of breath.  Chest pain. EXAM: CT ANGIOGRAPHY CHEST WITH CONTRAST TECHNIQUE: Multidetector CT imaging of the chest was performed using the standard protocol during bolus administration of intravenous contrast. Multiplanar CT image reconstructions and MIPs were obtained to evaluate the vascular anatomy. CONTRAST:  67mL ISOVUE-370 IOPAMIDOL (ISOVUE-370) INJECTION 76% COMPARISON:  Chest radiograph yesterday. FINDINGS: Cardiovascular: There are no filling defects within the pulmonary arteries to suggest pulmonary embolus. Multi chamber cardiomegaly. Thoracic aorta is normal in caliber without dissection. Mild aortic  atherosclerosis. Small to moderate pericardial effusion measures up to 2 cm in depth. This is slightly complex. There are coronary artery calcifications. Mediastinum/Nodes: Small mediastinal and hilar lymph nodes not enlarged by size criteria. Mild enlargement of the right thyroid gland without dominant nodule. The esophagus is decompressed. Small hiatal hernia. Lungs/Pleura:  Ground-glass opacities in the dependent upper lobes, diffusely throughout the right and left lower lobe and to a lesser extent right middle lobe. Findings are most prominent in a dependent distribution. No septal thickening. No pleural effusion. Trachea and mainstem bronchi are patent. Upper Abdomen: No acute findings. Musculoskeletal: Multilevel degenerative change in the spine. There are no acute or suspicious osseous abnormalities. Sclerosis about posterior right fourth rib at the chondro vertebral junction likely secondary to remote prior fracture. Review of the MIP images confirms the above findings. IMPRESSION: 1. No pulmonary embolus. 2. Multifocal ground-glass opacities, most prominent in the lung bases. Findings may represent pulmonary edema, infectious or inflammatory etiologies, or developing ARDS. 3. Cardiomegaly with small to moderate pericardial effusion. 4. Aortic Atherosclerosis (ICD10-I70.0). Coronary artery calcifications. Electronically Signed   By: Jeb Levering M.D.   On: 10/20/2017 05:38   Dg Esophagus  Result Date: 11/13/2017 CLINICAL DATA:  Dysphagia EXAM: ESOPHOGRAM/BARIUM SWALLOW TECHNIQUE: Single contrast examination was performed using  thin barium. FLUOROSCOPY TIME:  Fluoroscopy Time:  2 minutes Radiation Exposure Index (if provided by the fluoroscopic device): Number of Acquired Spot Images: 0 COMPARISON:  None. FINDINGS: Fluoroscopic evaluation of swallowing demonstrates disruption of primary esophageal peristaltic waves and stasis. No fixed stricture, fold thickening or mass. The patient swallowed a 13 mm  barium tablet. This briefly sits in the mid to distal esophagus, likely due to the semi recumbent nature. There appears to be no stricture in this area. Upon standing patient more erect, this passes into the stomach. IMPRESSION: Nonspecific esophageal motility disorder. No visible fixed stricture. Electronically Signed   By: Rolm Baptise M.D.   On: 11/13/2017 15:02   Dg Chest Port 1 View  Result Date: 10/22/2017 CLINICAL DATA:  Pulmonary edema EXAM: PORTABLE CHEST 1 VIEW COMPARISON:  10/19/2017 FINDINGS: Cardiomegaly with vascular congestion. Increasing perihilar and bibasilar opacities could reflect atelectasis or edema. No visible significant effusions or acute bony abnormality. IMPRESSION: Cardiomegaly with vascular congestion. Increasing perihilar and lower lobe atelectasis and/or edema. Electronically Signed   By: Rolm Baptise M.D.   On: 10/22/2017 12:17   Dg Abd Portable 1v  Result Date: 11/13/2017 CLINICAL DATA:  Abdomen pain EXAM: PORTABLE ABDOMEN - 1 VIEW COMPARISON:  Chest x-ray 11/12/2017 FINDINGS: Mild gaseous enlargement of central small bowel. Scattered colon gas is present. Calcified phleboliths in the pelvis. IMPRESSION: Mild gaseous enlargement of central small bowel but with colon gas present. Findings could be secondary to mild ileus. If developing or partial bowel obstruction is suspected, radiographic follow-up could be obtained. Electronically Signed   By: Donavan Foil M.D.   On: 11/13/2017 04:04    Microbiology: No results found for this or any previous visit (from the past 240 hour(s)).   Labs: Basic Metabolic Panel: Recent Labs  Lab 11/12/17 1004 11/13/17 0321  NA 140 140  K 3.0* 3.5  CL 105 102  CO2 23 27  GLUCOSE 104* 101*  BUN 10 10  CREATININE 1.38* 1.17*  CALCIUM 8.8* 8.8*   Liver Function Tests: Recent Labs  Lab 11/12/17 1004 11/13/17 1011  AST 44* 36  ALT 24 21  ALKPHOS 58 58  BILITOT 0.9 0.9  PROT 8.1 8.4*  ALBUMIN 3.1* 3.1*   Recent Labs   Lab 11/13/17 1011  LIPASE 45   No results for input(s): AMMONIA in the last 168 hours. CBC: Recent Labs  Lab 11/12/17 1004 11/13/17 0321  WBC 5.2 4.3  NEUTROABS 3.7  --   HGB 11.4* 11.1*  HCT 37.0  36.6  MCV 76.9* 77.4*  PLT 347 323   Cardiac Enzymes: Recent Labs  Lab 11/12/17 1537 11/12/17 2049 11/13/17 0321  TROPONINI 0.04* 0.03* 0.04*   BNP: BNP (last 3 results) Recent Labs    10/20/17 0419 10/20/17 1848 11/12/17 1004  BNP 253.5* 135.4* 278.8*    ProBNP (last 3 results) No results for input(s): PROBNP in the last 8760 hours.  CBG: Recent Labs  Lab 11/12/17 1647 11/12/17 2141 11/13/17 0745 11/13/17 1207  GLUCAP 69* 122* 135* 166*       Signed:  Fayrene Helper MD.  [facility] 11/13/2017, 4:07 PM

## 2017-11-14 ENCOUNTER — Other Ambulatory Visit: Payer: Self-pay | Admitting: Internal Medicine

## 2017-11-14 ENCOUNTER — Telehealth: Payer: Self-pay | Admitting: Internal Medicine

## 2017-11-14 NOTE — Telephone Encounter (Signed)
Was discharged from the hospital, please arrange a TCM

## 2017-11-16 ENCOUNTER — Other Ambulatory Visit: Payer: Self-pay | Admitting: Licensed Clinical Social Worker

## 2017-11-16 ENCOUNTER — Telehealth: Payer: Self-pay

## 2017-11-16 ENCOUNTER — Other Ambulatory Visit: Payer: Self-pay

## 2017-11-16 NOTE — Telephone Encounter (Signed)
Will call patient today

## 2017-11-16 NOTE — Telephone Encounter (Signed)
11/16/17  Transition Care Management Follow-up Telephone Call  ADMISSION DATE: 11/12/17  DISCHARGE DATE: 11/13/17   How have you been since you were released from the hospital?Patient states she has been SOB like before she went to hospital.    Do you understand why you were in the hospital? Yes   Do you understand the discharge instrcutions? Yes    Items Reviewed:  Medications reviewed: Yes   Allergies reviewed: Yes   Dietary changes reviewed:Low sodium heart healthy.   Referrals reviewed: Appointment scheduled for hospital follow up.   Functional Questionnaire:   Activities of Daily Living (ADLs): Per patient she needs help with her ADL'S.  Any patient concerns? None at this time    Confirmed importance and date/time of follow-up visits scheduled:Yes   Confirmed with patient if condition begins to worsen call PCP or go to the ER. Yes    Patient was given the office number and encouragred to call back with questions or concerns. Yes

## 2017-11-16 NOTE — Patient Outreach (Signed)
[facility]) Care Management  11/16/2017  [name] 1950/09/14 [id number]  [facility] CSW completed outreach call to patient and was able to successfully reach her. Patient provided HIPPA verifications. Patient reports that she successfully received grief support resources in the mail that [facility] CSW sent. [facility] CSW completed entire review of resources again with patient. Patient reports that she is not interested in pursuing this resources at this time but will keep information somewhere safe in case she wishes to utilize resources in the future. Patient was informed that [place] attempted to reach patient last week to discuss PCS but was unable to reach her. Patient reports experiencing SOB and is not able to get pen and paper to write down BSW's contact information. Patient encouraged to keep phone close by to maintain contact with her care providers. Patient agreeable. [facility] CSW will sign off at this time now that resources have been successfully received and reviewed.   Eula Fried, BSW, MSW, [email or web address] Phone: [phone] Fax: [phone]

## 2017-11-16 NOTE — Patient Outreach (Signed)
[facility]) Care Management  11/16/2017  ELINE GENG 1950-11-13 [id number]   BSW called [place] for an update on the request for [place] submitted on 11/10/17.  It was reported that Ms. Schnitzler has an assessment scheduled for tomorrow, 11/17/17. BSW contacted [name] and she confirmed this appointment.  BSW will follow up with her again within the next two weeks regarding the results of the assessment.  Ronn Melena, BSW Social Worker [phone]

## 2017-11-16 NOTE — Telephone Encounter (Deleted)
11/16/17  Transition Care Management Follow-up Telephone Call   ADMISSION DATE: 11/13/17  DISCHARGE DATE: 11/15/17   How have you been since you were released from the hospital? Feeling pretty good per patient. Do you understand why you were in the hospital? Yes   Do you understand the discharge instrcutions? Yes    Items Reviewed:  Medications reviewed: Yes   Allergies reviewed: Yes  Dietary changes reviewed: High Bran modified liquid per patient.  Referrals reviewed:Follow up appointment scheduled.  Functional Questionnaire:   Activities of Daily Living (ADLs): Patient can perform all independently.  Any patient concerns? Not at this time.   Confirmed importance and date/time of follow-up visits scheduled:Yes    Confirmed with patient if condition begins to worsen call PCP or go to the ER. Yes     Patient was given the office number and encouragred to call back with questions or concerns. Yes

## 2017-11-17 ENCOUNTER — Encounter (HOSPITAL_COMMUNITY): Payer: Self-pay

## 2017-11-17 ENCOUNTER — Other Ambulatory Visit: Payer: Self-pay

## 2017-11-17 ENCOUNTER — Emergency Department (HOSPITAL_COMMUNITY): Payer: Medicare Other

## 2017-11-17 ENCOUNTER — Ambulatory Visit: Payer: Self-pay

## 2017-11-17 ENCOUNTER — Inpatient Hospital Stay (HOSPITAL_COMMUNITY)
Admission: EM | Admit: 2017-11-17 | Discharge: 2017-11-20 | DRG: 308 | Disposition: A | Discharge status: Home Health | Payer: Medicare Other | Attending: Family Medicine | Admitting: Family Medicine

## 2017-11-17 DIAGNOSIS — I10 Essential (primary) hypertension: Secondary | ICD-10-CM | POA: Diagnosis not present

## 2017-11-17 DIAGNOSIS — M1990 Unspecified osteoarthritis, unspecified site: Secondary | ICD-10-CM | POA: Diagnosis not present

## 2017-11-17 DIAGNOSIS — Z825 Family history of asthma and other chronic lower respiratory diseases: Secondary | ICD-10-CM | POA: Diagnosis not present

## 2017-11-17 DIAGNOSIS — R778 Other specified abnormalities of plasma proteins: Secondary | ICD-10-CM

## 2017-11-17 DIAGNOSIS — Z7982 Long term (current) use of aspirin: Secondary | ICD-10-CM

## 2017-11-17 DIAGNOSIS — I509 Heart failure, unspecified: Secondary | ICD-10-CM | POA: Diagnosis not present

## 2017-11-17 DIAGNOSIS — K219 Gastro-esophageal reflux disease without esophagitis: Secondary | ICD-10-CM | POA: Diagnosis not present

## 2017-11-17 DIAGNOSIS — E785 Hyperlipidemia, unspecified: Secondary | ICD-10-CM | POA: Diagnosis not present

## 2017-11-17 DIAGNOSIS — Z794 Long term (current) use of insulin: Secondary | ICD-10-CM | POA: Diagnosis not present

## 2017-11-17 DIAGNOSIS — Z96653 Presence of artificial knee joint, bilateral: Secondary | ICD-10-CM | POA: Diagnosis present

## 2017-11-17 DIAGNOSIS — Z6839 Body mass index (BMI) 39.0-39.9, adult: Secondary | ICD-10-CM

## 2017-11-17 DIAGNOSIS — R0789 Other chest pain: Secondary | ICD-10-CM | POA: Diagnosis not present

## 2017-11-17 DIAGNOSIS — N170 Acute kidney failure with tubular necrosis: Secondary | ICD-10-CM | POA: Diagnosis not present

## 2017-11-17 DIAGNOSIS — J441 Chronic obstructive pulmonary disease with (acute) exacerbation: Secondary | ICD-10-CM

## 2017-11-17 DIAGNOSIS — R079 Chest pain, unspecified: Secondary | ICD-10-CM | POA: Diagnosis present

## 2017-11-17 DIAGNOSIS — J449 Chronic obstructive pulmonary disease, unspecified: Secondary | ICD-10-CM | POA: Diagnosis not present

## 2017-11-17 DIAGNOSIS — E039 Hypothyroidism, unspecified: Secondary | ICD-10-CM | POA: Diagnosis present

## 2017-11-17 DIAGNOSIS — N181 Chronic kidney disease, stage 1: Secondary | ICD-10-CM | POA: Diagnosis not present

## 2017-11-17 DIAGNOSIS — H409 Unspecified glaucoma: Secondary | ICD-10-CM | POA: Diagnosis present

## 2017-11-17 DIAGNOSIS — N390 Urinary tract infection, site not specified: Secondary | ICD-10-CM | POA: Diagnosis not present

## 2017-11-17 DIAGNOSIS — E1122 Type 2 diabetes mellitus with diabetic chronic kidney disease: Secondary | ICD-10-CM | POA: Diagnosis not present

## 2017-11-17 DIAGNOSIS — Z87891 Personal history of nicotine dependence: Secondary | ICD-10-CM | POA: Diagnosis not present

## 2017-11-17 DIAGNOSIS — G4733 Obstructive sleep apnea (adult) (pediatric): Secondary | ICD-10-CM | POA: Diagnosis present

## 2017-11-17 DIAGNOSIS — J9621 Acute and chronic respiratory failure with hypoxia: Secondary | ICD-10-CM | POA: Diagnosis present

## 2017-11-17 DIAGNOSIS — Z91128 Patient's intentional underdosing of medication regimen for other reason: Secondary | ICD-10-CM

## 2017-11-17 DIAGNOSIS — Z823 Family history of stroke: Secondary | ICD-10-CM

## 2017-11-17 DIAGNOSIS — Z79890 Hormone replacement therapy (postmenopausal): Secondary | ICD-10-CM

## 2017-11-17 DIAGNOSIS — Z8249 Family history of ischemic heart disease and other diseases of the circulatory system: Secondary | ICD-10-CM | POA: Diagnosis not present

## 2017-11-17 DIAGNOSIS — R000 Tachycardia, unspecified: Secondary | ICD-10-CM | POA: Diagnosis not present

## 2017-11-17 DIAGNOSIS — R748 Abnormal levels of other serum enzymes: Secondary | ICD-10-CM

## 2017-11-17 DIAGNOSIS — T447X6A Underdosing of beta-adrenoreceptor antagonists, initial encounter: Secondary | ICD-10-CM | POA: Diagnosis present

## 2017-11-17 DIAGNOSIS — J9811 Atelectasis: Secondary | ICD-10-CM | POA: Diagnosis not present

## 2017-11-17 DIAGNOSIS — Z800 Family history of malignant neoplasm of digestive organs: Secondary | ICD-10-CM | POA: Diagnosis not present

## 2017-11-17 DIAGNOSIS — Z833 Family history of diabetes mellitus: Secondary | ICD-10-CM

## 2017-11-17 DIAGNOSIS — D899 Disorder involving the immune mechanism, unspecified: Secondary | ICD-10-CM | POA: Diagnosis present

## 2017-11-17 DIAGNOSIS — I2510 Atherosclerotic heart disease of native coronary artery without angina pectoris: Secondary | ICD-10-CM | POA: Diagnosis not present

## 2017-11-17 DIAGNOSIS — J45909 Unspecified asthma, uncomplicated: Secondary | ICD-10-CM | POA: Diagnosis present

## 2017-11-17 DIAGNOSIS — N179 Acute kidney failure, unspecified: Secondary | ICD-10-CM

## 2017-11-17 DIAGNOSIS — R072 Precordial pain: Secondary | ICD-10-CM

## 2017-11-17 DIAGNOSIS — G9340 Encephalopathy, unspecified: Secondary | ICD-10-CM | POA: Diagnosis not present

## 2017-11-17 DIAGNOSIS — D649 Anemia, unspecified: Secondary | ICD-10-CM

## 2017-11-17 DIAGNOSIS — R262 Difficulty in walking, not elsewhere classified: Secondary | ICD-10-CM | POA: Diagnosis not present

## 2017-11-17 DIAGNOSIS — Z7951 Long term (current) use of inhaled steroids: Secondary | ICD-10-CM

## 2017-11-17 DIAGNOSIS — E119 Type 2 diabetes mellitus without complications: Secondary | ICD-10-CM

## 2017-11-17 DIAGNOSIS — I200 Unstable angina: Secondary | ICD-10-CM | POA: Diagnosis not present

## 2017-11-17 DIAGNOSIS — R0602 Shortness of breath: Secondary | ICD-10-CM | POA: Diagnosis not present

## 2017-11-17 DIAGNOSIS — I700 Atherosclerosis of aorta: Secondary | ICD-10-CM | POA: Diagnosis present

## 2017-11-17 DIAGNOSIS — Z9981 Dependence on supplemental oxygen: Secondary | ICD-10-CM

## 2017-11-17 DIAGNOSIS — R7989 Other specified abnormal findings of blood chemistry: Secondary | ICD-10-CM

## 2017-11-17 DIAGNOSIS — E1149 Type 2 diabetes mellitus with other diabetic neurological complication: Secondary | ICD-10-CM | POA: Diagnosis not present

## 2017-11-17 DIAGNOSIS — J9801 Acute bronchospasm: Secondary | ICD-10-CM

## 2017-11-17 DIAGNOSIS — M6281 Muscle weakness (generalized): Secondary | ICD-10-CM | POA: Diagnosis not present

## 2017-11-17 DIAGNOSIS — M4316 Spondylolisthesis, lumbar region: Secondary | ICD-10-CM | POA: Diagnosis present

## 2017-11-17 HISTORY — DX: Urinary tract infection, site not specified: N39.0

## 2017-11-17 LAB — CBC WITH DIFFERENTIAL/PLATELET
ABS IMMATURE GRANULOCYTES: 0 10*3/uL (ref 0.0–0.1)
BASOS PCT: 0 %
Basophils Absolute: 0 10*3/uL (ref 0.0–0.1)
EOS ABS: 0 10*3/uL (ref 0.0–0.7)
Eosinophils Relative: 0 %
HCT: 36 % (ref 36.0–46.0)
Hemoglobin: 11.2 g/dL — ABNORMAL LOW (ref 12.0–15.0)
IMMATURE GRANULOCYTES: 0 %
LYMPHS ABS: 0.5 10*3/uL — AB (ref 0.7–4.0)
Lymphocytes Relative: 8 %
MCH: 23.6 pg — ABNORMAL LOW (ref 26.0–34.0)
MCHC: 31.1 g/dL (ref 30.0–36.0)
MCV: 75.9 fL — AB (ref 78.0–100.0)
MONOS PCT: 12 %
Monocytes Absolute: 0.7 10*3/uL (ref 0.1–1.0)
NEUTROS ABS: 4.7 10*3/uL (ref 1.7–7.7)
NEUTROS PCT: 80 %
PLATELETS: 305 10*3/uL (ref 150–400)
RBC: 4.74 MIL/uL (ref 3.87–5.11)
RDW: 20.9 % — AB (ref 11.5–15.5)
WBC: 6 10*3/uL (ref 4.0–10.5)

## 2017-11-17 LAB — GLUCOSE, CAPILLARY
GLUCOSE-CAPILLARY: 68 mg/dL — AB (ref 70–99)
Glucose-Capillary: 59 mg/dL — ABNORMAL LOW (ref 70–99)
Glucose-Capillary: 104 mg/dL — ABNORMAL HIGH (ref 70–99)
Glucose-Capillary: 69 mg/dL — ABNORMAL LOW (ref 70–99)

## 2017-11-17 LAB — BASIC METABOLIC PANEL
ANION GAP: 15 (ref 5–15)
BUN: 18 mg/dL (ref 8–23)
CALCIUM: 9 mg/dL (ref 8.9–10.3)
CO2: 23 mmol/L (ref 22–32)
CREATININE: 1.86 mg/dL — AB (ref 0.44–1.00)
Chloride: 102 mmol/L (ref 98–111)
GFR, EST AFRICAN AMERICAN: 31 mL/min — AB (ref >60)
GFR, EST NON AFRICAN AMERICAN: 27 mL/min — AB (ref >60)
Glucose, Bld: 125 mg/dL — ABNORMAL HIGH (ref 70–99)
Potassium: 3.5 mmol/L (ref 3.5–5.1)
SODIUM: 140 mmol/L (ref 135–145)

## 2017-11-17 LAB — URINALYSIS, ROUTINE W REFLEX MICROSCOPIC
BILIRUBIN URINE: NEGATIVE
Glucose, UA: NEGATIVE mg/dL
KETONES UR: NEGATIVE mg/dL
Nitrite: NEGATIVE
PH: 5 (ref 5.0–8.0)
Protein, ur: 100 mg/dL — AB
SPECIFIC GRAVITY, URINE: 1.015 (ref 1.005–1.030)
WBC, UA: >50 WBC/hpf — ABNORMAL HIGH (ref 0–5)

## 2017-11-17 LAB — TROPONIN I
TROPONIN I: 0.08 ng/mL — AB (ref <0.03)
Troponin I: 0.07 ng/mL (ref <0.03)
Troponin I: 0.07 ng/mL (ref <0.03)

## 2017-11-17 LAB — D-DIMER, QUANTITATIVE: D-Dimer, Quant: 2.13 ug/mL-FEU — ABNORMAL HIGH (ref 0.00–0.50)

## 2017-11-17 LAB — CK TOTAL AND CKMB (NOT AT ARMC)
CK, MB: 6.7 ng/mL — AB (ref 0.5–5.0)
RELATIVE INDEX: 0.8 (ref 0.0–2.5)
Total CK: 883 U/L — ABNORMAL HIGH (ref 38–234)

## 2017-11-17 LAB — BRAIN NATRIURETIC PEPTIDE: B Natriuretic Peptide: 149.3 pg/mL — ABNORMAL HIGH (ref 0.0–100.0)

## 2017-11-17 LAB — HISTONE ANTIBODIES, IGG, BLOOD: DNA-HISTONE: 1.1 U — AB (ref 0.0–0.9)

## 2017-11-17 MED ORDER — MOMETASONE FURO-FORMOTEROL FUM 200-5 MCG/ACT IN AERO
2.0000 | INHALATION_SPRAY | Freq: Two times a day (BID) | RESPIRATORY_TRACT | Status: DC
  Administered 2017-11-18 – 2017-11-20 (×3): 2 via RESPIRATORY_TRACT
  Filled 2017-11-17: qty 8.8

## 2017-11-17 MED ORDER — CEFTRIAXONE SODIUM 1 G IJ SOLR
1.0000 g | INTRAMUSCULAR | Status: DC
  Administered 2017-11-17 – 2017-11-19 (×2): 1 g via INTRAVENOUS
  Filled 2017-11-17 (×4): qty 10

## 2017-11-17 MED ORDER — ACETAMINOPHEN 325 MG PO TABS
650.0000 mg | ORAL_TABLET | Freq: Four times a day (QID) | ORAL | Status: DC | PRN
  Administered 2017-11-18 – 2017-11-20 (×2): 650 mg via ORAL
  Filled 2017-11-17 (×2): qty 2

## 2017-11-17 MED ORDER — ENOXAPARIN SODIUM 30 MG/0.3ML ~~LOC~~ SOLN
30.0000 mg | SUBCUTANEOUS | Status: DC
  Administered 2017-11-17: 30 mg via SUBCUTANEOUS
  Filled 2017-11-17: qty 0.3

## 2017-11-17 MED ORDER — SODIUM CHLORIDE 0.9 % IV BOLUS
500.0000 mL | Freq: Once | INTRAVENOUS | Status: AC
  Administered 2017-11-17: 500 mL via INTRAVENOUS

## 2017-11-17 MED ORDER — LEVOTHYROXINE SODIUM 75 MCG PO TABS
75.0000 ug | ORAL_TABLET | Freq: Every day | ORAL | Status: DC
  Administered 2017-11-18 – 2017-11-20 (×3): 75 ug via ORAL
  Filled 2017-11-17 (×5): qty 1

## 2017-11-17 MED ORDER — INSULIN ASPART 100 UNIT/ML ~~LOC~~ SOLN
0.0000 [IU] | Freq: Three times a day (TID) | SUBCUTANEOUS | Status: DC
  Administered 2017-11-19: 2 [IU] via SUBCUTANEOUS
  Administered 2017-11-20 (×2): 1 [IU] via SUBCUTANEOUS

## 2017-11-17 MED ORDER — ASPIRIN EC 81 MG PO TBEC
81.0000 mg | DELAYED_RELEASE_TABLET | Freq: Every day | ORAL | Status: DC
  Administered 2017-11-17 – 2017-11-20 (×4): 81 mg via ORAL
  Filled 2017-11-17 (×5): qty 1

## 2017-11-17 MED ORDER — PANTOPRAZOLE SODIUM 40 MG PO TBEC
40.0000 mg | DELAYED_RELEASE_TABLET | Freq: Every day | ORAL | Status: DC
  Administered 2017-11-18 – 2017-11-20 (×3): 40 mg via ORAL
  Filled 2017-11-17 (×4): qty 1

## 2017-11-17 MED ORDER — POTASSIUM CHLORIDE CRYS ER 10 MEQ PO TBCR
10.0000 meq | EXTENDED_RELEASE_TABLET | Freq: Every day | ORAL | Status: DC
  Administered 2017-11-17 – 2017-11-20 (×4): 10 meq via ORAL
  Filled 2017-11-17 (×5): qty 1

## 2017-11-17 MED ORDER — ROSUVASTATIN CALCIUM 10 MG PO TABS
40.0000 mg | ORAL_TABLET | Freq: Every day | ORAL | Status: DC
  Administered 2017-11-17 – 2017-11-20 (×4): 40 mg via ORAL
  Filled 2017-11-17 (×5): qty 4

## 2017-11-17 MED ORDER — DEXTROSE 50 % IV SOLN
INTRAVENOUS | Status: AC
  Administered 2017-11-17: 50 mL
  Filled 2017-11-17: qty 50

## 2017-11-17 MED ORDER — LATANOPROST 0.005 % OP SOLN
1.0000 [drp] | Freq: Every day | OPHTHALMIC | Status: DC
  Administered 2017-11-17 – 2017-11-20 (×3): 1 [drp] via OPHTHALMIC
  Filled 2017-11-17: qty 2.5

## 2017-11-17 MED ORDER — ACETAMINOPHEN 650 MG RE SUPP: 650.0000 mg | Freq: Four times a day (QID) | RECTAL | Status: DC | PRN

## 2017-11-17 MED ORDER — METOPROLOL TARTRATE 25 MG PO TABS
25.0000 mg | ORAL_TABLET | Freq: Two times a day (BID) | ORAL | Status: DC
  Administered 2017-11-17 – 2017-11-20 (×7): 25 mg via ORAL
  Filled 2017-11-17 (×7): qty 1

## 2017-11-17 MED ORDER — SODIUM CHLORIDE 0.9 % IV SOLN
INTRAVENOUS | Status: AC
  Administered 2017-11-17: 23:00:00 via INTRAVENOUS

## 2017-11-17 MED ORDER — ALBUTEROL SULFATE (2.5 MG/3ML) 0.083% IN NEBU: 2.5000 mg | INHALATION_SOLUTION | RESPIRATORY_TRACT | Status: DC | PRN

## 2017-11-17 MED ORDER — LORAZEPAM 2 MG/ML IJ SOLN
1.0000 mg | Freq: Once | INTRAMUSCULAR | Status: AC
  Administered 2017-11-17: 1 mg via INTRAVENOUS
  Filled 2017-11-17: qty 1

## 2017-11-17 MED ORDER — INSULIN ASPART 100 UNIT/ML ~~LOC~~ SOLN: 0.0000 [IU] | Freq: Every day | SUBCUTANEOUS | Status: DC

## 2017-11-17 NOTE — Telephone Encounter (Signed)
FYI
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
## 2017-11-17 NOTE — Progress Notes (Signed)
Hypoglycemic Event  CBG: 59  Treatment: 15 GM carbohydrate snack  Symptoms: None  Follow-up CBG: Time:2230 CBG Result:68  Possible Reasons for Event: Inadequate meal intake   Follow-up CBG: Time:2256 CBG Result:69  Gave D50.  CBG came up to 104. Will continue to monitor.   Myrian Botello

## 2017-11-17 NOTE — ED Notes (Signed)
Patient transported to X-ray 

## 2017-11-17 NOTE — H&P (Addendum)
TRH H&P   Patient Demographics:    [name], is a 67 y.o. female  MRN: [id number]   DOB - [birth year]  Admit Date - 11/17/2017  Outpatient Primary MD for the patient is Colon Branch, MD  Referring MD/NP/PA:   Suella Broad  Outpatient Specialists:    Patient coming from:  home  Chief Complaint  Patient presents with  . Chest Pain      HPI:    [name]  is a 67 y.o. female, w hypertension, hyperlipidemia, dm2, ? Drug induced lupus,  OSA on cpap apparently c/o high bp at home and therefore presented to ED, and then was noted to be tachycardic, and to have left upper quadrant vs left lower chest pain.  Pain with palpation.  Slight nausea and heartburn,  Pt denies radiation of the pain.  Slight sob.  Denies fever, chills, cough, palp, diarrhea, brbpr.  Pt denies NSAIDS.   In ED,  CXR IMPRESSION: Cardiomegaly with aortic atherosclerosis and mild interstitial edema. Bibasilar atelectasis.  Urinalysis wbc >50  BNP 149.3 Trop 0.07 Wbc 6.0, Hgb 11.2, Plt 305 Na 140 K 3.5  Bun 18, creatinine 1.86   EKG ST at 130, nl axis, poor R progression  Pt will be admitted for w/up of tachycardia, left sided chest pain and UTI    Review of systems:    In addition to the HPI above,   No Fever-chills, No Headache, No changes with Vision or hearing, No problems swallowing food or Liquids, No Chest pain, Cough or Shortness of Breath, No Abdominal pain, No Nausea or Vommitting, Bowel movements are regular, No Blood in stool or Urine, No dysuria, No new skin rashes or bruises, No new joints pains-aches,  No new weakness, tingling, numbness in any extremity, No recent weight gain or loss, No polyuria, polydypsia or polyphagia, No significant Mental Stressors.  A full 10 point Review of Systems was done, except as stated above, all other Review of Systems were  negative.   With Past History of the following :    Past Medical History:  Diagnosis Date  . Anterolisthesis    Grade 1, L4-5  . Bronchospasm 05/28/2012  . CHEST PAIN 11/18/2007  . DEGENERATIVE JOINT DISEASE 10/06/2006  . DEPRESSION 09/26/2008  . DIABETES MELLITUS 10/06/2006  . Diverticulosis    [id number]  . GERD (gastroesophageal reflux disease) 07/25/2013  . HYPERLIPIDEMIA 01/11/2009  . HYPERTENSION 10/06/2006  . INSOMNIA 09/26/2008  . Internal hemorrhoids   . OBSTRUCTIVE SLEEP APNEA 06/23/2008   Severe OSA per sleep study 2010, Rx a CPAP  . Pain in joint, multiple sites 11/10/2006  . UNSPECIFIED ANEMIA 12/10/2009      Past Surgical History:  Procedure Laterality Date  . COLONOSCOPY  08/01/2011   Procedure: COLONOSCOPY;  Surgeon: Inda Castle, MD;  Location: [facility];  Service: Endoscopy;  Laterality: N/A;  . LEFT HEART  CATH AND CORONARY ANGIOGRAPHY N/A 10/22/2017   Procedure: LEFT HEART CATH AND CORONARY ANGIOGRAPHY;  Surgeon: Jettie Booze, MD;  Location: [facility];  Service: Cardiovascular;  Laterality: N/A;  . 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 Right knee replacement  2005      Social History:     Social History   Tobacco Use  . Smoking status: Former Smoker    Packs/day: 0.20    Years: 4.00    Pack years: 0.80    Types: Cigarettes    Last attempt to quit: 05/13/1995    Years since quitting: 22.5  . Smokeless tobacco: Never Used  Substance Use Topics  . Alcohol use: Not Currently     Lives - at home   Mobility - walks    Family History :     Family History  Problem Relation Age of Onset  . Asthma Mother   . Stroke Mother   . Diabetes Other        M, B, S  . Hypertension Sister        M, S,B  . Pancreatic cancer Brother   . Colon cancer Neg Hx   . Prostate cancer Neg Hx   . Breast cancer Neg Hx        Home Medications:   Prior to Admission medications   Medication Sig Start Date End Date Taking? Authorizing Provider  albuterol  (PROVENTIL HFA;VENTOLIN HFA) 108 (90 Base) MCG/ACT inhaler Inhale 2 puffs into the lungs every 4 (four) hours as needed for wheezing. 11/03/17 01/01/29 Yes Colon Branch, MD  aspirin EC 81 MG tablet Take 81 mg by mouth daily.   Yes [provider]  budesonide-formoterol (SYMBICORT) 160-4.5 MCG/ACT inhaler Inhale 2 puffs into the lungs 2 (two) times daily. 09/01/14  Yes Paz, Alda Berthold, MD  colchicine 0.6 MG tablet Take 1 tablet (0.6 mg total) by mouth 2 (two) times daily. 10/24/17  Yes Patrecia Pour, MD  Insulin Lispro Prot & Lispro (HUMALOG MIX 50/50 KWIKPEN) (50-50) 100 UNIT/ML Kwikpen Inject 5 Units into the skin 2 (two) times daily with a meal. And pen needles 2/day 10/24/17  Yes Patrecia Pour, MD  latanoprost (XALATAN) 0.005 % ophthalmic solution USE 1 DROP IN BOTH EYES AT BEDTIME 12/09/16  Yes [provider]  levothyroxine (SYNTHROID, LEVOTHROID) 75 MCG tablet Take 1 tablet (75 mcg total) by mouth daily before breakfast. 10/24/17  Yes Patrecia Pour, MD  metoprolol tartrate (LOPRESSOR) 25 MG tablet Take 1 tablet (25 mg total) by mouth 2 (two) times daily. 10/24/17  Yes Patrecia Pour, MD  OXYGEN Inhale 3 L into the lungs continuous.    Yes [provider]  pantoprazole (PROTONIX) 40 MG tablet Take 1 tablet (40 mg total) by mouth daily before breakfast. 11/02/17  Yes Paz, Alda Berthold, MD  potassium chloride (K-DUR,KLOR-CON) 10 MEQ tablet TAKE 1 TABLET(10 MEQ) BY MOUTH DAILY 10/15/17  Yes Paz, Alda Berthold, MD  rosuvastatin (CRESTOR) 40 MG tablet Take 1 tablet (40 mg total) by mouth daily. 11/16/17  Yes Paz, Alda Berthold, MD  telmisartan (MICARDIS) 40 MG tablet Take 1 tablet (40 mg total) by mouth daily. 06/08/17  Yes Colon Branch, MD  ACCU-CHEK AVIVA PLUS test strip TEST BLOD SUGAR TWICE DAILY AND LANCETS TWICE DAILY 10/15/17   Renato Shin, MD  ACCU-CHEK SOFTCLIX LANCETS lancets USE TO CHECK BLOOD SUGAR TWICE A DAY 10/05/17   Renato Shin, MD  BD ULTRA-FINE PEN NEEDLES 29G  X 12.7MM MISC USE TWICE A DAY  10/27/17   Renato Shin, MD     Allergies:    No Known Allergies   Physical Exam:   Vitals  Blood pressure 132/86, pulse (!) 113, temperature 98.7 F (37.1 C), temperature source Oral, resp. rate (!) 30, height 5\' 9"  (1.753 m), weight 122.5 kg (270 lb), SpO2 97 %.   1. General lying in bed in NAD,    2. Normal affect and insight, Not Suicidal or Homicidal, Awake Alert, Oriented X 3.  3. No F.N deficits, ALL C.Nerves Intact, Strength 5/5 all 4 extremities, Sensation intact all 4 extremities, Plantars down going.  4. Ears and Eyes appear Normal, Conjunctivae clear, PERRLA. Moist Oral Mucosa.  5. Supple Neck, No JVD, No cervical lymphadenopathy appriciated, No Carotid Bruits.  6. Symmetrical Chest wall movement, Good air movement bilaterally, CTAB.  7. Tachy s1, s2, , No Gallops, Rubs or Murmurs, No Parasternal Heave.  8. Positive Bowel Sounds, Abdomen Soft, No tenderness, No organomegaly appriciated,No rebound -guarding or rigidity.  9.  No Cyanosis, Normal Skin Turgor, No Skin Rash or Bruise.  10. Good muscle tone,  joints appear normal , no effusions, Normal ROM.  11. No Palpable Lymph Nodes in Neck or Axillae     Data Review:    CBC Recent Labs  Lab 11/12/17 1004 11/13/17 0321 11/17/17 1404  WBC 5.2 4.3 6.0  HGB 11.4* 11.1* 11.2*  HCT 37.0 36.6 36.0  PLT 347 323 305  MCV 76.9* 77.4* 75.9*  MCH 23.7* 23.5* 23.6*  MCHC 30.8 30.3 31.1  RDW 20.2* 19.9* 20.9*  LYMPHSABS 0.7  --  0.5*  MONOABS 0.8  --  0.7  EOSABS 0.0  --  0.0  BASOSABS 0.0  --  0.0   ------------------------------------------------------------------------------------------------------------------  Chemistries  Recent Labs  Lab 11/12/17 1004 11/13/17 0321 11/13/17 1011 11/17/17 1452  NA 140 140  --  140  K 3.0* 3.5  --  3.5  CL 105 102  --  102  CO2 23 27  --  23  GLUCOSE 104* 101*  --  125*  BUN 10 10  --  18  CREATININE 1.38* 1.17*  --  1.86*  CALCIUM 8.8* 8.8*  --  9.0  AST  44*  --  36  --   ALT 24  --  21  --   ALKPHOS 58  --  58  --   BILITOT 0.9  --  0.9  --    ------------------------------------------------------------------------------------------------------------------ estimated creatinine clearance is 40.5 mL/min (A) (by C-G formula based on SCr of 1.86 mg/dL (H)). ------------------------------------------------------------------------------------------------------------------ No results for input(s): TSH, T4TOTAL, T3FREE, THYROIDAB in the last 72 hours.  Invalid input(s): FREET3  Coagulation profile No results for input(s): INR, PROTIME in the last 168 hours. ------------------------------------------------------------------------------------------------------------------- No results for input(s): DDIMER in the last 72 hours. -------------------------------------------------------------------------------------------------------------------  Cardiac Enzymes Recent Labs  Lab 11/12/17 2049 11/13/17 0321 11/17/17 1452  TROPONINI 0.03* 0.04* 0.07*   ------------------------------------------------------------------------------------------------------------------    Component Value Date/Time   BNP 149.3 (H) 11/17/2017 1404     ---------------------------------------------------------------------------------------------------------------  Urinalysis    Component Value Date/Time   COLORURINE YELLOW 11/17/2017 1323   APPEARANCEUR CLOUDY (A) 11/17/2017 1323   LABSPEC 1.015 11/17/2017 1323   PHURINE 5.0 11/17/2017 1323   GLUCOSEU NEGATIVE 11/17/2017 1323   GLUCOSEU NEGATIVE 11/30/2013 1057   HGBUR MODERATE (A) 11/17/2017 1323   BILIRUBINUR NEGATIVE 11/17/2017 1323   KETONESUR NEGATIVE 11/17/2017 1323   PROTEINUR 100 (A) 11/17/2017 1323  UROBILINOGEN 0.2 11/30/2013 1057   NITRITE NEGATIVE 11/17/2017 1323   LEUKOCYTESUR LARGE (A) 11/17/2017 1323     ----------------------------------------------------------------------------------------------------------------   Imaging Results:    Dg Chest 2 View  Result Date: 11/17/2017 CLINICAL DATA:  Dyspnea. EXAM: CHEST - 2 VIEW COMPARISON:  11/12/2017 FINDINGS: Cardiomegaly with aortic atherosclerosis. No aneurysm. Mild interstitial edema and bibasilar atelectasis is noted. Thoracic spondylosis is identified with osteoarthritis of the included shoulders. IMPRESSION: Cardiomegaly with aortic atherosclerosis and mild interstitial edema. Bibasilar atelectasis. Electronically Signed   By: Ashley Royalty M.D.   On: 11/17/2017 17:17       Assessment & Plan:    Principal Problem:   Tachycardia Active Problems:   DM II (diabetes mellitus, type II), controlled (HCC)   Anemia   Chest pain   ARF (acute renal failure) (HCC)   Elevated troponin    Tachycardia probably due to not taking metoprolol this AM Tele Trop I q6h x3  Elevated trop probably due to renal insufficiency vs demand ischemia Trop I q6h x3 D dimer, if positive then VQ scan  Check cardiac echo Cont Aspirin 81mg  po qday Cont Metoprolol 25mg  po bid Cont Crestor 40mg  po qhs  (may need renal dosing)  Chest pain reproducible with palpation Since has trop elevation Cardiology consult via email requested  ARF Check urine sodium, urine creatinine, urine eosinophils Check renal ultrasound STOP Micardis Hydrate with ns iv Check cmp in am   Anemia Check cbc in am Consider spep, upep as outpatient  Dm2 fsbs q4h, ISS  Hypothyroidism Cont Levothyroxine 75 micrograms po qday Check TSH  Gerd Cont PPI  ? Asthma/OSA  Cont Symbicort Cont Cpap  Glaucoma Cont home eye drops  UTI Rocephin 1gm iv qday  DVT Prophylaxis  Lovenox - SCDs  AM Labs Ordered, also please review Full Orders  Family Communication: Admission, patients condition and plan of care including tests being ordered have been discussed with the patient   who indicate understanding and agree with the plan and Code Status.  Code Status  FULL CODE  Likely DC to  home  Condition GUARDED    Consults called: cardiology by email   Admission status:  observation  Time spent in minutes : 70   Jani Gravel M.D on 11/17/2017 at 6:59 PM  Between 7am to 7pm - Pager - [phone]  . After 7pm go to [email or web address] - password [facility]  [facility] - Office  [phone]
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
## 2017-11-17 NOTE — ED Triage Notes (Signed)
Pt arrives to ED from home after the pt's home health nurse found the pt to be hypertensive this morning. EMS reports pt did not take her BP medication this morning because she was nauseous. Pt has chronic CP and tachypnea, states it feels the same way it always does today. EMS gave 1nitro, cp remained the same, 324 asa given as well. 3L O2 via [place] baseline. Pt placed in position of comfort with bed locked and lowered, call bell in reach.

## 2017-11-17 NOTE — ED Provider Notes (Addendum)
[facility] EMERGENCY DEPARTMENT Provider Note   CSN: [id number] Arrival date & time: 11/17/17  1230     History   Chief Complaint Chief Complaint  Patient presents with  . Chest Pain    HPI [name] is a 67 y.o. female.  67 year old female brought in by EMS for elevated blood pressure.  Per nursing notes, home health nurse noted patient's blood pressure was elevated today, she did not take her blood pressure medication and was sent to the emergency room.  Patient states that she has shortness of breath and chest pain which are normal for her, she has been evaluated for this on several occasions and she states there is nothing new or different about today's symptoms.  Patient believes that she is in the emergency room today, sent by her son's so that they could take her belongings from her house.  Patient states that she has 2 sons, [name] and [place].  Patient states that [name] and his wife like to travel frequently, her son Acquanetta Chain and his wife just remodeled their kitchen in [place] with nice white tile, she was up at their house for 4 July, it is beautiful.  Patient repeats the facts about her son's multiple times.  Patient states that only God is listening to her and requests that I talk in a low voice because "they are listening to you."  Patient states that she feels safe where she lives, denies thoughts of hurting herself or others.  Patient states that she does not want to be in the emergency room today and she would like to go home so that she can talk to the police about her son's taking her belongings, states the police do not believe her but they can drive to [place] and see her son's new kitchen.  No other complaints or concerns.     Past Medical History:  Diagnosis Date  . Anterolisthesis    Grade 1, L4-5  . Bronchospasm 05/28/2012  . CHEST PAIN 11/18/2007  . DEGENERATIVE JOINT DISEASE 10/06/2006  . DEPRESSION 09/26/2008  . DIABETES  MELLITUS 10/06/2006  . Diverticulosis    [id number]  . GERD (gastroesophageal reflux disease) 07/25/2013  . HYPERLIPIDEMIA 01/11/2009  . HYPERTENSION 10/06/2006  . INSOMNIA 09/26/2008  . Internal hemorrhoids   . OBSTRUCTIVE SLEEP APNEA 06/23/2008   Severe OSA per sleep study 2010, Rx a CPAP  . Pain in joint, multiple sites 11/10/2006  . UNSPECIFIED ANEMIA 12/10/2009    Patient Active Problem List   Diagnosis Date Noted  . Tachycardia 11/17/2017  . Acute on chronic respiratory failure with hypoxia ([place]) 11/12/2017  . Shortness of breath   . Pericardial effusion   . Acute pulmonary edema (H[facility])   . Acute respiratory failure with hypoxia ([place]) 10/20/2017  . Pain 10/19/2017  . Paresthesia 10/19/2017  . Weakness 10/19/2017  . Neck pain 10/19/2017  . PCP NOTES >>> 02/23/2015  . Nocturnal oxygen desaturation 01/02/2015  . Morbid obesity ([place]) 01/02/2015  . Asthma, chronic 01/02/2015  . GERD (gastroesophageal reflux disease) 07/25/2013  . DOE (dyspnea on exertion) 04/25/2013  . 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 Bronchospasm 05/28/2012  . Internal hemorrhoids without mention of complication 03/08/2535  . Annual physical exam 06/06/2011  . SKIN LESION 08/06/2009  . Hyperlipidemia 01/11/2009  . Depression 09/26/2008  . INSOMNIA 09/26/2008  . Obstructive sleep apnea 06/23/2008  . Chest pain 11/18/2007  . DM II (diabetes mellitus, type II), controlled ([place]) 10/06/2006  .  Essential hypertension 10/06/2006  . Osteoarthritis 10/06/2006    Past Surgical History:  Procedure Laterality Date  . COLONOSCOPY  08/01/2011   Procedure: COLONOSCOPY;  Surgeon: Inda Castle, MD;  Location: [facility];  Service: Endoscopy;  Laterality: N/A;  . LEFT HEART CATH AND CORONARY ANGIOGRAPHY N/A 10/22/2017   Procedure: LEFT HEART CATH AND CORONARY ANGIOGRAPHY;  Surgeon: Jettie Booze, MD;  Location: [facility];  Service: Cardiovascular;  Laterality:  N/A;  . 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 Right knee replacement  2005     OB History   None      Home Medications    Prior to Admission medications   Medication Sig Start Date End Date Taking? Authorizing Provider  albuterol (PROVENTIL HFA;VENTOLIN HFA) 108 (90 Base) MCG/ACT inhaler Inhale 2 puffs into the lungs every 4 (four) hours as needed for wheezing. 11/03/17 01/01/29 Yes Colon Branch, MD  aspirin EC 81 MG tablet Take 81 mg by mouth daily.   Yes [provider]  budesonide-formoterol (SYMBICORT) 160-4.5 MCG/ACT inhaler Inhale 2 puffs into the lungs 2 (two) times daily. 09/01/14  Yes Paz, Alda Berthold, MD  colchicine 0.6 MG tablet Take 1 tablet (0.6 mg total) by mouth 2 (two) times daily. 10/24/17  Yes Patrecia Pour, MD  Insulin Lispro Prot & Lispro (HUMALOG MIX 50/50 KWIKPEN) (50-50) 100 UNIT/ML Kwikpen Inject 5 Units into the skin 2 (two) times daily with a meal. And pen needles 2/day 10/24/17  Yes Patrecia Pour, MD  latanoprost (XALATAN) 0.005 % ophthalmic solution USE 1 DROP IN BOTH EYES AT BEDTIME 12/09/16  Yes [provider]  levothyroxine (SYNTHROID, LEVOTHROID) 75 MCG tablet Take 1 tablet (75 mcg total) by mouth daily before breakfast. 10/24/17  Yes Patrecia Pour, MD  metoprolol tartrate (LOPRESSOR) 25 MG tablet Take 1 tablet (25 mg total) by mouth 2 (two) times daily. 10/24/17  Yes Patrecia Pour, MD  OXYGEN Inhale 3 L into the lungs continuous.    Yes [provider]  pantoprazole (PROTONIX) 40 MG tablet Take 1 tablet (40 mg total) by mouth daily before breakfast. 11/02/17  Yes Paz, Alda Berthold, MD  potassium chloride (K-DUR,KLOR-CON) 10 MEQ tablet TAKE 1 TABLET(10 MEQ) BY MOUTH DAILY 10/15/17  Yes Paz, Alda Berthold, MD  rosuvastatin (CRESTOR) 40 MG tablet Take 1 tablet (40 mg total) by mouth daily. 11/16/17  Yes Paz, Alda Berthold, MD  telmisartan (MICARDIS) 40 MG tablet Take 1 tablet (40 mg total) by mouth daily. 06/08/17  Yes Colon Branch, MD  ACCU-CHEK AVIVA PLUS test strip TEST  BLOD SUGAR TWICE DAILY AND LANCETS TWICE DAILY 10/15/17   Renato Shin, MD  ACCU-CHEK SOFTCLIX LANCETS lancets USE TO CHECK BLOOD SUGAR TWICE A DAY 10/05/17   Renato Shin, MD  BD ULTRA-FINE PEN NEEDLES 29G X 12.7MM MISC USE TWICE A DAY 10/27/17   Renato Shin, MD    Family History Family History  Problem Relation Age of Onset  . Asthma Mother   . Stroke Mother   . Diabetes Other        M, B, S  . Hypertension Sister        M, S,B  . Pancreatic cancer Brother   . Colon cancer Neg Hx   . Prostate cancer Neg Hx   . Breast cancer Neg Hx     Social History Social History   Tobacco Use  . Smoking status: Former Smoker    Packs/day: 0.20  Years: 4.00    Pack years: 0.80    Types: Cigarettes    Last attempt to quit: 05/13/1995    Years since quitting: 22.5  . Smokeless tobacco: Never Used  Substance Use Topics  . Alcohol use: Not Currently  . Drug use: No     Allergies   Patient has no known allergies.   Review of Systems Review of Systems  Constitutional: Negative for chills, diaphoresis and fever.  Respiratory: Positive for shortness of breath.   Cardiovascular: Positive for chest pain.  Gastrointestinal: Positive for nausea. Negative for abdominal pain and vomiting.  Genitourinary: Negative for dysuria, frequency and urgency.  Musculoskeletal: Negative for arthralgias and myalgias.  Skin: Negative for wound.  Allergic/Immunologic: Positive for immunocompromised state.  Neurological: Negative for dizziness, weakness and headaches.  Hematological: Does not bruise/bleed easily.  Psychiatric/Behavioral: Negative for confusion.  All other systems reviewed and are negative.    Physical Exam Updated Vital Signs BP 132/86   Pulse (!) 113   Temp 98.7 F (37.1 C) (Oral)   Resp (!) 30   Ht 5\' 9"  (1.753 m)   Wt 122.5 kg (270 lb)   SpO2 97%   BMI 39.87 kg/m   Physical Exam  Constitutional: She is oriented to person, place, and time. She appears well-developed  and well-nourished.  HENT:  Head: Normocephalic and atraumatic.  Cardiovascular: Regular rhythm, intact distal pulses and normal pulses. Tachycardia present.  Pulmonary/Chest: Breath sounds normal. Tachypnea noted.  Abdominal: Soft. She exhibits no distension. There is no tenderness.  Musculoskeletal:       Right lower leg: Normal.       Left lower leg: Normal.  Neurological: She is alert and oriented to person, place, and time.  Skin: Skin is warm and dry.  Psychiatric: Her mood appears anxious. Her speech is tangential. Thought content is paranoid. Cognition and memory are normal. She expresses no homicidal and no suicidal ideation.  Nursing note and vitals reviewed.    ED Treatments / Results  Labs (all labs ordered are listed, but only abnormal results are displayed) Labs Reviewed  URINALYSIS, ROUTINE W REFLEX MICROSCOPIC - Abnormal; Notable for the following components:      Result Value   APPearance CLOUDY (*)    Hgb urine dipstick MODERATE (*)    Protein, ur 100 (*)    Leukocytes, UA LARGE (*)    WBC, UA >50 (*)    Bacteria, UA RARE (*)    Non Squamous Epithelial 0-5 (*)    All other components within normal limits  CBC WITH DIFFERENTIAL/PLATELET - Abnormal; Notable for the following components:   Hemoglobin 11.2 (*)    MCV 75.9 (*)    MCH 23.6 (*)    RDW 20.9 (*)    Lymphs Abs 0.5 (*)    All other components within normal limits  BRAIN NATRIURETIC PEPTIDE - Abnormal; Notable for the following components:   B Natriuretic Peptide 149.3 (*)    All other components within normal limits  BASIC METABOLIC PANEL - Abnormal; Notable for the following components:   Glucose, Bld 125 (*)    Creatinine, Ser 1.86 (*)    GFR calc non Af Amer 27 (*)    GFR calc Af Amer 31 (*)    All other components within normal limits  TROPONIN I - Abnormal; Notable for the following components:   Troponin I 0.07 (*)    All other components within normal limits  TROPONIN I    EKG EKG  Interpretation  Date/Time:  Tuesday November 17 2017 12:34:38 EDT Ventricular Rate:  130 PR Interval:    QRS Duration: 93 QT Interval:  355 QTC Calculation: 521 R Axis:   60 Text Interpretation:  Sinus tachycardia Borderline T abnormalities, inferior leads Minimal ST elevation, lateral leads Prolonged QT interval Artifact in lead(s) I II III aVR aVL V1 V2 Sinc elast EKG, rate has increased Otherwise no significant change Confirmed by Duffy Bruce [phone]) on 11/17/2017 12:56:25 PM   Radiology Dg Chest 2 View  Result Date: 11/17/2017 CLINICAL DATA:  Dyspnea. EXAM: CHEST - 2 VIEW COMPARISON:  11/12/2017 FINDINGS: Cardiomegaly with aortic atherosclerosis. No aneurysm. Mild interstitial edema and bibasilar atelectasis is noted. Thoracic spondylosis is identified with osteoarthritis of the included shoulders. IMPRESSION: Cardiomegaly with aortic atherosclerosis and mild interstitial edema. Bibasilar atelectasis. Electronically Signed   By: Ashley Royalty M.D.   On: 11/17/2017 17:17    Procedures Procedures (including critical care time)  Medications Ordered in ED Medications  LORazepam (ATIVAN) injection 1 mg (1 mg Intravenous Given 11/17/17 1358)  sodium chloride 0.9 % bolus 500 mL (500 mLs Intravenous New Bag/Given 11/17/17 1717)     Initial Impression / Assessment and Plan / ED Course  I have reviewed the triage vital signs and the nursing notes.  Pertinent labs & imaging results that were available during my care of the patient were reviewed by me and considered in my medical decision making (see chart for details).  Clinical Course as of Nov 17 1845  Tue Nov 17, 2017  1827 Patient remains tachycardic after IV fluids, unable to CT to evaluate for PE due to patient's renal function, unable to VQ scan due to chronic lung disease. Troponin is elevated at 0.07, normally around 0.03-0.04, possibly elevated today due to her renal function. CBC unremarkable, UA with moderate hgh, purwick sample and  contaminated. Discussed with Dr. Ellender Hose, ER attending, recommends consult hospitalist for admission for possible Pe, as patient remains tachycardic after IV fluids and IV ativan.   [LM]  4737 67 year old female brought in by EMS, patient's home health nurse called 911 today when patient's blood pressure was elevated, she had not taken her blood pressure medication due to feeling nauseous today.  Patient denied any complaints, is concerned that her sons are stealing from her, patient seems paranoid and repeats stories about her son's multiple times and requests that we not talk loud as her sons are listening.  Of note her family is not in the emergency room with her today.  On exam patient is tachycardic, she is tachypneic, she is on her 3 L nasal cannula baseline O2.  Patient states that she is always short of breath and always has pain in her chest and that is nothing new or different for her today.   [LM]  1846 Case discussed with Dr. Maudie Mercury, hospitalist, will admit.   [LM]    Clinical Course User Index [LM] Tacy Learn, PA-C    Final Clinical Impressions(s) / ED Diagnoses   Final diagnoses:  Tachycardia    ED Discharge Orders    None       Tacy Learn, PA-C 11/17/17 1846    Tacy Learn, PA-C 11/17/17 Pollie Meyer    Duffy Bruce, MD 11/18/17 [phone]

## 2017-11-17 NOTE — Telephone Encounter (Signed)
noted 

## 2017-11-17 NOTE — ED Notes (Signed)
ED Provider at bedside. 

## 2017-11-17 NOTE — Telephone Encounter (Signed)
rec'd call from [place] Freda Munro).  Reported arrived at pt's home shortly after 11:00 AM.  Pt. c/o severe pain in left upper chest , with radiation down to the upper quadrants of abdomen; pain goes across the upper abdominal region.  Reported pain is continuous since Sunday, and varies in intensity.  Rated pain 9/10 at present time.  Reported BP 140/118, R. 24.  Stated pt. is speaking in broken sentences, due to trying to get her breath.  On 02 at 3L/ [place]; 02 Sat. 90 %.  Reported pt. was in ER recently for chest pain.  Pt. reported nausea last night, and vomited x 2.   C/o dizziness with standing.  At 11:40 AM, BP 180/120, P. 108, 02 Sat. 93 %.  Due to level of chest pain, elevated BP, and shortness of breath, advised to call 911.  [place] RN agreed to call 911 for the pt.       Reason for Disposition . [1] Chest pain lasts > 5 minutes AND [2] age > 58  Answer Assessment - Initial Assessment Questions 1. LOCATION: "Where does it hurt?"       Left upper chest and to bilateral upper quad. Of abd.  2. RADIATION: "Does the pain go anywhere else?" (e.g., into neck, jaw, arms, back)     To bilateral upper abdomen 3. ONSET: "When did the chest pain begin?" (Minutes, hours or days)      Sunday 4. PATTERN "Does the pain come and go, or has it been constant since it started?"  "Does it get worse with exertion?"      Constant 5. DURATION: "How long does it last" (e.g., seconds, minutes, hours)    Continuous and varies with intensity 6. SEVERITY: "How bad is the pain?"  (e.g., Scale 1-10; mild, moderate, or severe)    - MILD (1-3): doesn't interfere with normal activities     - MODERATE (4-7): interferes with normal activities or awakens from sleep    - SEVERE (8-10): excruciating pain, unable to do any normal activities       Rated 6-7/10 to 10/10; today ; rates at 9/10 7. CARDIAC RISK FACTORS: "Do you have any history of heart problems or risk factors for heart disease?" (e.g., prior heart attack,  angina; high blood pressure, diabetes, being overweight, high cholesterol, smoking, or strong family history of heart disease)     Diabetes, hypertension, OSA, obesity 8. PULMONARY RISK FACTORS: "Do you have any history of lung disease?"  (e.g., blood clots in lung, asthma, emphysema, birth control pills)     Asthma; hx of resp. Failure with hypoxia 9. CAUSE: "What do you think is causing the chest pain?"     unknown 10. OTHER SYMPTOMS: "Do you have any other symptoms?" (e.g., dizziness, nausea, vomiting, sweating, fever, difficulty breathing, cough)       Short of breath @ rest; per [facility] RN, pt. Speaks in broken sentences to get her breath,  02 at 3 liters/ [place]; nausea and vomited last evening and during night; dizziness with standing. BP 140/118, 02 Sat. 90 % on 3L 02/. @ 11:15 AM;  BP 180/120, Pulse 108, 02 Sat. 93 % @ 11:40 AM  Protocols used: CHEST PAIN-A-AH

## 2017-11-18 ENCOUNTER — Encounter (HOSPITAL_COMMUNITY): Payer: Self-pay | Admitting: General Practice

## 2017-11-18 ENCOUNTER — Observation Stay (HOSPITAL_COMMUNITY): Payer: Medicare Other

## 2017-11-18 DIAGNOSIS — D649 Anemia, unspecified: Secondary | ICD-10-CM | POA: Diagnosis present

## 2017-11-18 DIAGNOSIS — M1990 Unspecified osteoarthritis, unspecified site: Secondary | ICD-10-CM | POA: Diagnosis present

## 2017-11-18 DIAGNOSIS — N170 Acute kidney failure with tubular necrosis: Secondary | ICD-10-CM | POA: Diagnosis not present

## 2017-11-18 DIAGNOSIS — E119 Type 2 diabetes mellitus without complications: Secondary | ICD-10-CM | POA: Diagnosis present

## 2017-11-18 DIAGNOSIS — Z7951 Long term (current) use of inhaled steroids: Secondary | ICD-10-CM | POA: Diagnosis not present

## 2017-11-18 DIAGNOSIS — E785 Hyperlipidemia, unspecified: Secondary | ICD-10-CM | POA: Diagnosis present

## 2017-11-18 DIAGNOSIS — Z800 Family history of malignant neoplasm of digestive organs: Secondary | ICD-10-CM | POA: Diagnosis not present

## 2017-11-18 DIAGNOSIS — Z823 Family history of stroke: Secondary | ICD-10-CM | POA: Diagnosis not present

## 2017-11-18 DIAGNOSIS — R000 Tachycardia, unspecified: Secondary | ICD-10-CM | POA: Diagnosis not present

## 2017-11-18 DIAGNOSIS — G9340 Encephalopathy, unspecified: Secondary | ICD-10-CM | POA: Diagnosis not present

## 2017-11-18 DIAGNOSIS — I200 Unstable angina: Secondary | ICD-10-CM | POA: Diagnosis not present

## 2017-11-18 DIAGNOSIS — Z825 Family history of asthma and other chronic lower respiratory diseases: Secondary | ICD-10-CM | POA: Diagnosis not present

## 2017-11-18 DIAGNOSIS — Z96653 Presence of artificial knee joint, bilateral: Secondary | ICD-10-CM | POA: Diagnosis present

## 2017-11-18 DIAGNOSIS — N181 Chronic kidney disease, stage 1: Secondary | ICD-10-CM

## 2017-11-18 DIAGNOSIS — R82998 Other abnormal findings in urine: Secondary | ICD-10-CM | POA: Diagnosis not present

## 2017-11-18 DIAGNOSIS — I700 Atherosclerosis of aorta: Secondary | ICD-10-CM | POA: Diagnosis present

## 2017-11-18 DIAGNOSIS — Z833 Family history of diabetes mellitus: Secondary | ICD-10-CM | POA: Diagnosis not present

## 2017-11-18 DIAGNOSIS — Z7982 Long term (current) use of aspirin: Secondary | ICD-10-CM | POA: Diagnosis not present

## 2017-11-18 DIAGNOSIS — Z6839 Body mass index (BMI) 39.0-39.9, adult: Secondary | ICD-10-CM | POA: Diagnosis not present

## 2017-11-18 DIAGNOSIS — K219 Gastro-esophageal reflux disease without esophagitis: Secondary | ICD-10-CM | POA: Diagnosis present

## 2017-11-18 DIAGNOSIS — G4733 Obstructive sleep apnea (adult) (pediatric): Secondary | ICD-10-CM | POA: Diagnosis not present

## 2017-11-18 DIAGNOSIS — R748 Abnormal levels of other serum enzymes: Secondary | ICD-10-CM | POA: Diagnosis not present

## 2017-11-18 DIAGNOSIS — R0602 Shortness of breath: Secondary | ICD-10-CM | POA: Diagnosis not present

## 2017-11-18 DIAGNOSIS — I10 Essential (primary) hypertension: Secondary | ICD-10-CM | POA: Diagnosis present

## 2017-11-18 DIAGNOSIS — R4020 Unspecified coma: Secondary | ICD-10-CM | POA: Diagnosis not present

## 2017-11-18 DIAGNOSIS — E1122 Type 2 diabetes mellitus with diabetic chronic kidney disease: Secondary | ICD-10-CM | POA: Diagnosis not present

## 2017-11-18 DIAGNOSIS — Z8249 Family history of ischemic heart disease and other diseases of the circulatory system: Secondary | ICD-10-CM | POA: Diagnosis not present

## 2017-11-18 DIAGNOSIS — R079 Chest pain, unspecified: Secondary | ICD-10-CM | POA: Diagnosis present

## 2017-11-18 DIAGNOSIS — Z87891 Personal history of nicotine dependence: Secondary | ICD-10-CM | POA: Diagnosis not present

## 2017-11-18 DIAGNOSIS — J9621 Acute and chronic respiratory failure with hypoxia: Secondary | ICD-10-CM | POA: Diagnosis not present

## 2017-11-18 DIAGNOSIS — Z79890 Hormone replacement therapy (postmenopausal): Secondary | ICD-10-CM | POA: Diagnosis not present

## 2017-11-18 DIAGNOSIS — N390 Urinary tract infection, site not specified: Secondary | ICD-10-CM | POA: Diagnosis not present

## 2017-11-18 DIAGNOSIS — N179 Acute kidney failure, unspecified: Secondary | ICD-10-CM | POA: Diagnosis not present

## 2017-11-18 LAB — COMPREHENSIVE METABOLIC PANEL
ALBUMIN: 2.6 g/dL — AB (ref 3.5–5.0)
ALT: 48 U/L — ABNORMAL HIGH (ref 0–44)
AST: 82 U/L — AB (ref 15–41)
Alkaline Phosphatase: 58 U/L (ref 38–126)
Anion gap: 10 (ref 5–15)
BUN: 16 mg/dL (ref 8–23)
CHLORIDE: 106 mmol/L (ref 98–111)
CO2: 27 mmol/L (ref 22–32)
Calcium: 8.6 mg/dL — ABNORMAL LOW (ref 8.9–10.3)
Creatinine, Ser: 1.72 mg/dL — ABNORMAL HIGH (ref 0.44–1.00)
GFR calc Af Amer: 34 mL/min — ABNORMAL LOW (ref >60)
GFR calc non Af Amer: 29 mL/min — ABNORMAL LOW (ref >60)
GLUCOSE: 106 mg/dL — AB (ref 70–99)
POTASSIUM: 3.6 mmol/L (ref 3.5–5.1)
SODIUM: 143 mmol/L (ref 135–145)
Total Bilirubin: 0.5 mg/dL (ref 0.3–1.2)
Total Protein: 7.2 g/dL (ref 6.5–8.1)

## 2017-11-18 LAB — GLUCOSE, CAPILLARY
GLUCOSE-CAPILLARY: 91 mg/dL (ref 70–99)
GLUCOSE-CAPILLARY: 87 mg/dL (ref 70–99)
GLUCOSE-CAPILLARY: 108 mg/dL — AB (ref 70–99)
Glucose-Capillary: 114 mg/dL — ABNORMAL HIGH (ref 70–99)

## 2017-11-18 LAB — CBC
HEMATOCRIT: 35.2 % — AB (ref 36.0–46.0)
Hemoglobin: 10.7 g/dL — ABNORMAL LOW (ref 12.0–15.0)
MCH: 23.5 pg — ABNORMAL LOW (ref 26.0–34.0)
MCHC: 30.4 g/dL (ref 30.0–36.0)
MCV: 77.2 fL — ABNORMAL LOW (ref 78.0–100.0)
PLATELETS: 304 10*3/uL (ref 150–400)
RBC: 4.56 MIL/uL (ref 3.87–5.11)
RDW: 20.4 % — AB (ref 11.5–15.5)
WBC: 4.8 10*3/uL (ref 4.0–10.5)

## 2017-11-18 LAB — LIPID PANEL
CHOL/HDL RATIO: 3.1 ratio
Cholesterol: 94 mg/dL (ref 0–200)
HDL: 30 mg/dL — ABNORMAL LOW (ref >40)
LDL CALC: 42 mg/dL (ref 0–99)
Triglycerides: 110 mg/dL (ref <150)
VLDL: 22 mg/dL (ref 0–40)

## 2017-11-18 LAB — TSH: TSH: 11.758 u[IU]/mL — AB (ref 0.350–4.500)

## 2017-11-18 LAB — SODIUM, URINE, RANDOM: Sodium, Ur: 21 mmol/L

## 2017-11-18 LAB — C DIFFICILE QUICK SCREEN W PCR REFLEX
C DIFFICILE (CDIFF) TOXIN: NEGATIVE
C DIFFICLE (CDIFF) ANTIGEN: NEGATIVE
C Diff interpretation: NOT DETECTED

## 2017-11-18 LAB — TROPONIN I
TROPONIN I: 0.07 ng/mL — AB (ref <0.03)
Troponin I: 0.06 ng/mL (ref <0.03)

## 2017-11-18 LAB — T4, FREE: FREE T4: 1.21 ng/dL (ref 0.82–1.77)

## 2017-11-18 MED ORDER — METOPROLOL TARTRATE 5 MG/5ML IV SOLN
5.0000 mg | Freq: Once | INTRAVENOUS | Status: DC
  Filled 2017-11-18: qty 5

## 2017-11-18 MED ORDER — ENOXAPARIN SODIUM 40 MG/0.4ML ~~LOC~~ SOLN
40.0000 mg | SUBCUTANEOUS | Status: DC
  Administered 2017-11-19: 40 mg via SUBCUTANEOUS
  Filled 2017-11-18 (×2): qty 0.4

## 2017-11-18 MED ORDER — ALUM & MAG HYDROXIDE-SIMETH 200-200-20 MG/5ML PO SUSP
30.0000 mL | ORAL | Status: DC | PRN
  Administered 2017-11-18: 30 mL via ORAL
  Filled 2017-11-18: qty 30

## 2017-11-18 MED ORDER — ENSURE ENLIVE PO LIQD
237.0000 mL | Freq: Two times a day (BID) | ORAL | Status: DC
  Administered 2017-11-20 (×2): 237 mL via ORAL

## 2017-11-18 MED ORDER — TECHNETIUM TC 99M DIETHYLENETRIAME-PENTAACETIC ACID
30.0000 | Freq: Once | INTRAVENOUS | Status: AC | PRN
  Administered 2017-11-18: 30 via RESPIRATORY_TRACT

## 2017-11-18 MED ORDER — TECHNETIUM TO 99M ALBUMIN AGGREGATED
4.0000 | Freq: Once | INTRAVENOUS | Status: AC | PRN
  Administered 2017-11-18: 4 via INTRAVENOUS

## 2017-11-18 NOTE — Progress Notes (Addendum)
[facility] PROGRESS NOTE  [name] CHE:[id number] DOB: 02/05/1951 DOA: 11/17/2017   PCP: Colon Branch, MD     Assessment/Plan: Principal Problem:   Tachycardia Active Problems:   DM II (diabetes mellitus, type II), controlled (HCC)   Anemia   Chest pain   ARF (acute renal failure) (HCC)   Elevated troponin    67 y.o. female, w hypertension, hyperlipidemia, dm2, ? Drug induced lupus,  OSA on cpap apparently c/o high bp at home and therefore presented to ED, and then was noted to be tachycardic, and to have left upper quadrant vs left lower chest pain. Patient also found to have a urinary tract infection and now has diarrhea during this hospitalization  Assessment and plan Acute on chronic respiratory failure Patient has been on 3 L of oxygen since April Multiple admissions prior to this Recently found to have pericarditis which was treated with colchicine Cardiac cath was negative Echo showed a small pericardial effusion Also seen by pulmonary during previous admission, who recommended outpatient rheumatology evaluation as well as consideration of BAL and possibly VATS biopsy if she continued to have bibasilar infiltrates due to concern for possible immune inflammatory disease.pulmonary also recommended to Follow serum histone antiody, follow CT chest in 8 weeks  Patient also recently had a barium swallow which showed nonspecific  Esophageal motility disorder  Tachycardia probably due to not taking metoprolol this AM Tele Trop I q6h x3  Elevated trop probably due to renal insufficiency vs demand ischemia Trop I q6h x3 D dimer, if positive then VQ scan  Check cardiac echo Cont Aspirin 81mg  po qday Cont Metoprolol 25mg  po bid Cont Crestor 40mg  po qhs  (may need renal dosing)    Chest pain reproducible with palpation Since has trop elevation, recent cardiac cath was negative  no indication for repeat cardiology evaluation  ARF, likely  prerenal Creatinine was 1.17 on 7/5, now 1.86 Check urine sodium, urine creatinine, urine eosinophils Check renal ultrasound STOP Micardis Hydrate with ns iv Check cmp in am   Anemia Hemoglobin stable Consider spep, upep as outpatient  Dm2 fsbs q4h, ISS, hemoglobin A1c 5.6 on 10/20/17  Hypothyroidism Cont Levothyroxine 75 micrograms po qday TSH 11.7, will check free T4  Gerd Cont PPI  ? Asthma/OSA  Cont Symbicort Cont Cpap  Glaucoma Cont home eye drops  UTI Rocephin 1gm iv qday, follow urine culture closely  Diarrhea 2 days Apparently started last couple of days We'll order   C. Difficile and GI panel given recurrent hospitalizations     DVT prophylaxsis  Lovenox  Code Status:   Full code    Family Communication: Discussed in detail with the patient, all imaging results, lab results explained to the patient   Disposition Plan:  As above       Consultants:    cardiology  Procedures:  none  Antibiotics: Anti-infectives (From admission, onward)   Start     Dose/Rate Route Frequency Ordered Stop   11/17/17 2300  cefTRIAXone (ROCEPHIN) 1 g in sodium chloride 0.9 % 100 mL IVPB     1 g 200 mL/hr over 30 Minutes Intravenous Every 24 hours 11/17/17 2214           HPI/Subjective: Patient found to have diarrhea today, states she has had diarrhea for the last 2 days  Objective: Vitals:   11/18/17 0146 11/18/17 0516 11/18/17 0846 11/18/17 1125  BP:  127/86 136/75 (!) 154/94  Pulse:   92 97  Resp:  (!) 38 (!) 93 (!) 27  Temp:  98.6 F (37 C) 98 F (36.7 C) 97.6 F (36.4 C)  TempSrc:  Oral Oral Oral  SpO2:  91% 100% 96%  Weight: 116.1 kg (255 lb 15.3 oz)     Height:        Intake/Output Summary (Last 24 hours) at 11/18/2017 1230 Last data filed at 11/18/2017 0859 Gross per 24 hour  Intake 840 ml  Output -  Net 840 ml    Exam:  Examination:  General exam: Appears calm and comfortable  Respiratory system: Clear to  auscultation. Respiratory effort normal. Cardiovascular system: S1 & S2 heard, RRR. No JVD, murmurs, rubs, gallops or clicks. No pedal edema. Gastrointestinal system: Abdomen is nondistended, soft and nontender. No organomegaly or masses felt. Normal bowel sounds heard. Central nervous system: Alert and oriented. No focal neurological deficits. Extremities: Symmetric 5 x 5 power. Skin: No rashes, lesions or ulcers Psychiatry: Judgement and insight appear normal. Mood & affect appropriate.     Data Reviewed: I have personally reviewed following labs and imaging studies  Micro Results No results found for this or any previous visit (from the past 240 hour(s)).  Radiology Reports Dg Chest 2 View  Result Date: 11/17/2017 CLINICAL DATA:  Dyspnea. EXAM: CHEST - 2 VIEW COMPARISON:  11/12/2017 FINDINGS: Cardiomegaly with aortic atherosclerosis. No aneurysm. Mild interstitial edema and bibasilar atelectasis is noted. Thoracic spondylosis is identified with osteoarthritis of the included shoulders. IMPRESSION: Cardiomegaly with aortic atherosclerosis and mild interstitial edema. Bibasilar atelectasis. Electronically Signed   By: Ashley Royalty M.D.   On: 11/17/2017 17:17   Dg Chest 2 View  Result Date: 11/12/2017 CLINICAL DATA:  67 year old female with a history of left chest pain EXAM: CHEST - 2 VIEW COMPARISON:  11/02/2017, 10/22/2017, CT 10/20/2017 FINDINGS: Cardiomediastinal silhouette is unchanged in size and contour, enlarged. Low lung volumes. Interstitial opacities with no pneumothorax. Opacity in the costophrenic sulcus on the lateral view. No large confluent airspace disease. No displaced fracture. IMPRESSION: Similar appearance of interstitial opacities at the lung bases may reflect a combination of edema, atelectasis/consolidation, and/or chronic lung changes. Trace pleural effusions not excluded. Electronically Signed   By: Corrie Mckusick D.O.   On: 11/12/2017 10:23   Dg Chest 2 View  Result  Date: 11/02/2017 CLINICAL DATA:  Increasing shortness of breath and chest pain EXAM: CHEST - 2 VIEW COMPARISON:  10/22/2017 FINDINGS: Cardiomegaly is again identified. Mild vascular congestion is again seen with mild interstitial edema. No focal confluent infiltrate or sizable effusion is noted. IMPRESSION: Changes of mild CHF Electronically Signed   By: Inez Catalina M.D.   On: 11/02/2017 13:59   Dg Chest 2 View  Result Date: 10/20/2017 CLINICAL DATA:  Chest pain and shortness of breath EXAM: CHEST - 2 VIEW COMPARISON:  Sep 11, 2017 FINDINGS: Cardiomegaly. Mild edema. Mild patchy opacity in the lateral left lung base is more prominent the interval. No other interval changes. IMPRESSION: 1. Cardiomegaly and mild edema. 2. Mild patchy opacity in the lateral left lung base. Recommend follow-up to resolution. Electronically Signed   By: Dorise Bullion III M.D   On: 10/20/2017 00:03   Ct Angio Chest Pe W And/or Wo Contrast  Result Date: 10/20/2017 CLINICAL DATA:  Shortness of breath.  Chest pain. EXAM: CT ANGIOGRAPHY CHEST WITH CONTRAST TECHNIQUE: Multidetector CT imaging of the chest was performed using the standard protocol during bolus administration of intravenous contrast. Multiplanar CT image reconstructions and MIPs were obtained  to evaluate the vascular anatomy. CONTRAST:  39mL ISOVUE-370 IOPAMIDOL (ISOVUE-370) INJECTION 76% COMPARISON:  Chest radiograph yesterday. FINDINGS: Cardiovascular: There are no filling defects within the pulmonary arteries to suggest pulmonary embolus. Multi chamber cardiomegaly. Thoracic aorta is normal in caliber without dissection. Mild aortic atherosclerosis. Small to moderate pericardial effusion measures up to 2 cm in depth. This is slightly complex. There are coronary artery calcifications. Mediastinum/Nodes: Small mediastinal and hilar lymph nodes not enlarged by size criteria. Mild enlargement of the right thyroid gland without dominant nodule. The esophagus is  decompressed. Small hiatal hernia. Lungs/Pleura: Ground-glass opacities in the dependent upper lobes, diffusely throughout the right and left lower lobe and to a lesser extent right middle lobe. Findings are most prominent in a dependent distribution. No septal thickening. No pleural effusion. Trachea and mainstem bronchi are patent. Upper Abdomen: No acute findings. Musculoskeletal: Multilevel degenerative change in the spine. There are no acute or suspicious osseous abnormalities. Sclerosis about posterior right fourth rib at the chondro vertebral junction likely secondary to remote prior fracture. Review of the MIP images confirms the above findings. IMPRESSION: 1. No pulmonary embolus. 2. Multifocal ground-glass opacities, most prominent in the lung bases. Findings may represent pulmonary edema, infectious or inflammatory etiologies, or developing ARDS. 3. Cardiomegaly with small to moderate pericardial effusion. 4. Aortic Atherosclerosis (ICD10-I70.0). Coronary artery calcifications. Electronically Signed   By: Jeb Levering M.D.   On: 10/20/2017 05:38   Dg Esophagus  Result Date: 11/13/2017 CLINICAL DATA:  Dysphagia EXAM: ESOPHOGRAM/BARIUM SWALLOW TECHNIQUE: Single contrast examination was performed using  thin barium. FLUOROSCOPY TIME:  Fluoroscopy Time:  2 minutes Radiation Exposure Index (if provided by the fluoroscopic device): Number of Acquired Spot Images: 0 COMPARISON:  None. FINDINGS: Fluoroscopic evaluation of swallowing demonstrates disruption of primary esophageal peristaltic waves and stasis. No fixed stricture, fold thickening or mass. The patient swallowed a 13 mm barium tablet. This briefly sits in the mid to distal esophagus, likely due to the semi recumbent nature. There appears to be no stricture in this area. Upon standing patient more erect, this passes into the stomach. IMPRESSION: Nonspecific esophageal motility disorder. No visible fixed stricture. Electronically Signed   By:  Rolm Baptise M.D.   On: 11/13/2017 15:02   Nm Pulmonary Perf And Vent  Result Date: 11/18/2017 CLINICAL DATA:  Shortness of breath EXAM: NUCLEAR MEDICINE VENTILATION - PERFUSION LUNG SCAN VIEWS: Anterior, posterior, right lateral, left lateral, RPO, LPO, RAO, LAO-ventilation and perfusion RADIOPHARMACEUTICALS:  31.3 mCi of Tc-64m DTPA aerosol inhalation and 4.3 mCi Tc81m-MAA IV COMPARISON:  Chest radiograph November 17, 2017 FINDINGS: Ventilation: Radiotracer uptake on the ventilation study is homogeneous and symmetric bilaterally. There are no appreciable ventilation defects. Perfusion: Radiotracer uptake on the perfusion study is homogeneous and symmetric bilaterally. No perfusion defects are appreciable. IMPRESSION: No appreciable ventilation or perfusion defects. This study constitutes a very low probability of pulmonary embolus. Electronically Signed   By: Lowella Grip III M.D.   On: 11/18/2017 09:18   Dg Chest Port 1 View  Result Date: 10/22/2017 CLINICAL DATA:  Pulmonary edema EXAM: PORTABLE CHEST 1 VIEW COMPARISON:  10/19/2017 FINDINGS: Cardiomegaly with vascular congestion. Increasing perihilar and bibasilar opacities could reflect atelectasis or edema. No visible significant effusions or acute bony abnormality. IMPRESSION: Cardiomegaly with vascular congestion. Increasing perihilar and lower lobe atelectasis and/or edema. Electronically Signed   By: Rolm Baptise M.D.   On: 10/22/2017 12:17   Dg Abd Portable 1v  Result Date: 11/13/2017 CLINICAL DATA:  Abdomen pain EXAM:  PORTABLE ABDOMEN - 1 VIEW COMPARISON:  Chest x-ray 11/12/2017 FINDINGS: Mild gaseous enlargement of central small bowel. Scattered colon gas is present. Calcified phleboliths in the pelvis. IMPRESSION: Mild gaseous enlargement of central small bowel but with colon gas present. Findings could be secondary to mild ileus. If developing or partial bowel obstruction is suspected, radiographic follow-up could be obtained. Electronically  Signed   By: Donavan Foil M.D.   On: 11/13/2017 04:04     CBC Recent Labs  Lab 11/12/17 1004 11/13/17 0321 11/17/17 1404 11/18/17 0710  WBC 5.2 4.3 6.0 4.8  HGB 11.4* 11.1* 11.2* 10.7*  HCT 37.0 36.6 36.0 35.2*  PLT 347 323 305 304  MCV 76.9* 77.4* 75.9* 77.2*  MCH 23.7* 23.5* 23.6* 23.5*  MCHC 30.8 30.3 31.1 30.4  RDW 20.2* 19.9* 20.9* 20.4*  LYMPHSABS 0.7  --  0.5*  --   MONOABS 0.8  --  0.7  --   EOSABS 0.0  --  0.0  --   BASOSABS 0.0  --  0.0  --     Chemistries  Recent Labs  Lab 11/12/17 1004 11/13/17 0321 11/13/17 1011 11/17/17 1452 11/18/17 0710  NA 140 140  --  140 143  K 3.0* 3.5  --  3.5 3.6  CL 105 102  --  102 106  CO2 23 27  --  23 27  GLUCOSE 104* 101*  --  125* 106*  BUN 10 10  --  18 16  CREATININE 1.38* 1.17*  --  1.86* 1.72*  CALCIUM 8.8* 8.8*  --  9.0 8.6*  AST 44*  --  36  --  82*  ALT 24  --  21  --  48*  ALKPHOS 58  --  58  --  58  BILITOT 0.9  --  0.9  --  0.5   ------------------------------------------------------------------------------------------------------------------ estimated creatinine clearance is 42.6 mL/min (A) (by C-G formula based on SCr of 1.72 mg/dL (H)). ------------------------------------------------------------------------------------------------------------------ No results for input(s): HGBA1C in the last 72 hours. ------------------------------------------------------------------------------------------------------------------ Recent Labs    11/18/17 0012  CHOL 94  HDL 30*  LDLCALC 42  TRIG 110  CHOLHDL 3.1   ------------------------------------------------------------------------------------------------------------------ Recent Labs    11/18/17 0012  TSH 11.758*   ------------------------------------------------------------------------------------------------------------------ No results for input(s): VITAMINB12, FOLATE, FERRITIN, TIBC, IRON, RETICCTPCT in the last 72 hours.  Coagulation profile No  results for input(s): INR, PROTIME in the last 168 hours.  Recent Labs    11/17/17 2023  DDIMER 2.13*    Cardiac Enzymes Recent Labs  Lab 11/17/17 2023 11/18/17 0012 11/18/17 0710  CKMB 6.7*  --   --   TROPONINI 0.07* 0.07* 0.06*   ------------------------------------------------------------------------------------------------------------------ Invalid input(s): POCBNP   CBG: Recent Labs  Lab 11/17/17 2230 11/17/17 2256 11/17/17 2313 11/18/17 0612 11/18/17 1126  GLUCAP 68* 69* 104* 91 87       Studies: Dg Chest 2 View  Result Date: 11/17/2017 CLINICAL DATA:  Dyspnea. EXAM: CHEST - 2 VIEW COMPARISON:  11/12/2017 FINDINGS: Cardiomegaly with aortic atherosclerosis. No aneurysm. Mild interstitial edema and bibasilar atelectasis is noted. Thoracic spondylosis is identified with osteoarthritis of the included shoulders. IMPRESSION: Cardiomegaly with aortic atherosclerosis and mild interstitial edema. Bibasilar atelectasis. Electronically Signed   By: Ashley Royalty M.D.   On: 11/17/2017 17:17   Nm Pulmonary Perf And Vent  Result Date: 11/18/2017 CLINICAL DATA:  Shortness of breath EXAM: NUCLEAR MEDICINE VENTILATION - PERFUSION LUNG SCAN VIEWS: Anterior, posterior, right lateral, left lateral, RPO, LPO, RAO, LAO-ventilation and  perfusion RADIOPHARMACEUTICALS:  31.3 mCi of Tc-67m DTPA aerosol inhalation and 4.3 mCi Tc53m-MAA IV COMPARISON:  Chest radiograph November 17, 2017 FINDINGS: Ventilation: Radiotracer uptake on the ventilation study is homogeneous and symmetric bilaterally. There are no appreciable ventilation defects. Perfusion: Radiotracer uptake on the perfusion study is homogeneous and symmetric bilaterally. No perfusion defects are appreciable. IMPRESSION: No appreciable ventilation or perfusion defects. This study constitutes a very low probability of pulmonary embolus. Electronically Signed   By: Lowella Grip III M.D.   On: 11/18/2017 09:18      Lab Results   Component Value Date   HGBA1C 5.6 10/20/2017   HGBA1C 6.8 08/04/2017   HGBA1C 8.5 05/26/2017   Lab Results  Component Value Date   MICROALBUR 7.0 (H) 10/16/2015   LDLCALC 42 11/18/2017   CREATININE 1.72 (H) 11/18/2017       Scheduled Meds: . aspirin EC  81 mg Oral Daily  . enoxaparin (LOVENOX) injection  40 mg Subcutaneous Q24H  . feeding supplement (ENSURE ENLIVE)  237 mL Oral BID BM  . insulin aspart  0-5 Units Subcutaneous QHS  . insulin aspart  0-9 Units Subcutaneous TID WC  . latanoprost  1 drop Both Eyes QHS  . levothyroxine  75 mcg Oral QAC breakfast  . metoprolol tartrate  25 mg Oral BID  . mometasone-formoterol  2 puff Inhalation BID  . pantoprazole  40 mg Oral QAC breakfast  . potassium chloride  10 mEq Oral Daily  . rosuvastatin  40 mg Oral Daily   Continuous Infusions: . cefTRIAXone (ROCEPHIN)  IV Stopped (11/17/17 2355)     LOS: 0 days    Time spent: >30 MINS    Reyne Dumas  [facility] Pager [phone]. If 7PM-7AM, please contact night-coverage at [email or web address], password [facility] 11/18/2017, 12:30 PM  LOS: 0 days

## 2017-11-18 NOTE — Progress Notes (Addendum)
Upon arrival of shift, pt has been refusing care. I managed to get a set of vitals. BP was elevated. Paged attending. Pt refused most of her meds. Pt states she is already feeling better. Pt wants to be left alone until called. Call bell is within reach. Will continue to monitor.   Fransico Michael, RN

## 2017-11-19 ENCOUNTER — Telehealth: Payer: Self-pay | Admitting: *Deleted

## 2017-11-19 ENCOUNTER — Inpatient Hospital Stay (HOSPITAL_COMMUNITY): Payer: Medicare Other

## 2017-11-19 DIAGNOSIS — G9340 Encephalopathy, unspecified: Secondary | ICD-10-CM

## 2017-11-19 DIAGNOSIS — N170 Acute kidney failure with tubular necrosis: Secondary | ICD-10-CM

## 2017-11-19 DIAGNOSIS — I200 Unstable angina: Secondary | ICD-10-CM

## 2017-11-19 LAB — BLOOD GAS, ARTERIAL
ACID-BASE EXCESS: 1.2 mmol/L (ref 0.0–2.0)
BICARBONATE: 24.8 mmol/L (ref 20.0–28.0)
O2 Content: 2 L/min
O2 Saturation: 96.1 %
PCO2 ART: 35.9 mmHg (ref 32.0–48.0)
Patient temperature: 98.6
pH, Arterial: 7.454 — ABNORMAL HIGH (ref 7.350–7.450)
pO2, Arterial: 87.9 mmHg (ref 83.0–108.0)

## 2017-11-19 LAB — COMPREHENSIVE METABOLIC PANEL
ALK PHOS: 66 U/L (ref 38–126)
ALT: 47 U/L — AB (ref 0–44)
AST: 69 U/L — ABNORMAL HIGH (ref 15–41)
Albumin: 2.9 g/dL — ABNORMAL LOW (ref 3.5–5.0)
Anion gap: 16 — ABNORMAL HIGH (ref 5–15)
BUN: 15 mg/dL (ref 8–23)
CALCIUM: 8.9 mg/dL (ref 8.9–10.3)
CO2: 22 mmol/L (ref 22–32)
CREATININE: 1.69 mg/dL — AB (ref 0.44–1.00)
Chloride: 102 mmol/L (ref 98–111)
GFR calc non Af Amer: 30 mL/min — ABNORMAL LOW (ref >60)
GFR, EST AFRICAN AMERICAN: 35 mL/min — AB (ref >60)
Glucose, Bld: 182 mg/dL — ABNORMAL HIGH (ref 70–99)
Potassium: 3.5 mmol/L (ref 3.5–5.1)
SODIUM: 140 mmol/L (ref 135–145)
Total Bilirubin: 0.9 mg/dL (ref 0.3–1.2)
Total Protein: 7.9 g/dL (ref 6.5–8.1)

## 2017-11-19 LAB — GLUCOSE, CAPILLARY
GLUCOSE-CAPILLARY: 164 mg/dL — AB (ref 70–99)
GLUCOSE-CAPILLARY: 150 mg/dL — AB (ref 70–99)
GLUCOSE-CAPILLARY: 137 mg/dL — AB (ref 70–99)
GLUCOSE-CAPILLARY: 112 mg/dL — AB (ref 70–99)
GLUCOSE-CAPILLARY: 110 mg/dL — AB (ref 70–99)
Glucose-Capillary: 129 mg/dL — ABNORMAL HIGH (ref 70–99)

## 2017-11-19 LAB — CALCIUM / CREATININE RATIO, URINE: Creatinine, Urine: 139.3 mg/dL

## 2017-11-19 LAB — CBC
HCT: 38.2 % (ref 36.0–46.0)
Hemoglobin: 11.9 g/dL — ABNORMAL LOW (ref 12.0–15.0)
MCH: 23.8 pg — ABNORMAL LOW (ref 26.0–34.0)
MCHC: 31.2 g/dL (ref 30.0–36.0)
MCV: 76.4 fL — ABNORMAL LOW (ref 78.0–100.0)
PLATELETS: 351 10*3/uL (ref 150–400)
RBC: 5 MIL/uL (ref 3.87–5.11)
RDW: 20.8 % — ABNORMAL HIGH (ref 11.5–15.5)
WBC: 6 10*3/uL (ref 4.0–10.5)

## 2017-11-19 LAB — LACTIC ACID, PLASMA: Lactic Acid, Venous: 1.1 mmol/L (ref 0.5–1.9)

## 2017-11-19 LAB — AMMONIA: Ammonia: 41 umol/L — ABNORMAL HIGH (ref 9–35)

## 2017-11-19 LAB — TROPONIN I: Troponin I: 0.16 ng/mL (ref <0.03)

## 2017-11-19 LAB — TSH: TSH: 6.755 u[IU]/mL — ABNORMAL HIGH (ref 0.350–4.500)

## 2017-11-19 MED ORDER — METOPROLOL TARTRATE 5 MG/5ML IV SOLN: 5.0000 mg | Freq: Four times a day (QID) | INTRAVENOUS | Status: DC | PRN

## 2017-11-19 MED ORDER — NALOXONE HCL 0.4 MG/ML IJ SOLN: 0.4000 mg | INTRAMUSCULAR | Status: DC | PRN

## 2017-11-19 MED ORDER — NALOXONE HCL 0.4 MG/ML IJ SOLN
INTRAMUSCULAR | Status: AC
  Administered 2017-11-19: 0.2 mg
  Filled 2017-11-19: qty 1

## 2017-11-19 MED ORDER — METOPROLOL TARTRATE 5 MG/5ML IV SOLN
5.0000 mg | Freq: Once | INTRAVENOUS | Status: AC
  Administered 2017-11-19: 5 mg via INTRAVENOUS
  Filled 2017-11-19: qty 5

## 2017-11-19 MED ORDER — HYDRALAZINE HCL 20 MG/ML IJ SOLN
10.0000 mg | INTRAMUSCULAR | Status: DC | PRN
  Administered 2017-11-19: 10 mg via INTRAVENOUS
  Filled 2017-11-19: qty 1

## 2017-11-19 MED ORDER — HYDRALAZINE HCL 20 MG/ML IJ SOLN: 5.0000 mg | INTRAMUSCULAR | Status: DC | PRN

## 2017-11-19 MED ORDER — NALOXONE HCL 0.4 MG/ML IJ SOLN
INTRAMUSCULAR | Status: AC
  Administered 2017-11-19: 0.4 mg
  Filled 2017-11-19: qty 1

## 2017-11-19 MED ORDER — METOPROLOL TARTRATE 5 MG/5ML IV SOLN
5.0000 mg | Freq: Once | INTRAVENOUS | Status: AC
  Administered 2017-11-19: 5 mg via INTRAVENOUS

## 2017-11-19 NOTE — Progress Notes (Addendum)
[facility] PROGRESS NOTE  [name] GDJ:[id number] DOB: [birth year] DOA: 11/17/2017   PCP: Colon Branch, MD     Assessment/Plan: Principal Problem:   Tachycardia Active Problems:   DM II (diabetes mellitus, type II), controlled (HCC)   Anemia   Chest pain   ARF (acute renal failure) (HCC)   Elevated troponin   Acute hypoxemic respiratory failure ([place])    67 y.o. female, w hypertension, hyperlipidemia, dm2, ? Drug induced lupus,  OSA on cpap apparently c/o high bp at home and therefore presented to ED,  was noted to be tachycardic,  Also had left upper quadrant vs left lower chest pain. Patient   found to have a urinary tract infection/history of present illness , diarrhea during this hospitalization. C. Difficile negative. Rule out for PE in the setting of a sinus tachycardia. Then had an unresponsive episode on 7/11. Hypertensive during this episode with systolic blood pressure greater than 220.Code stroke  Was initiated and neurology consulted. Anticipate discharge to 3 days  Assessment and plan Unresponsive episode Patient last seen normal at midnight Unresponsive with stable vital signs She responded to ammonia tablets by sitting up and then vomiting Went back to becoming unresponsive again CT head negative for stroke acute changes Seen by neurology, they recommended EEG MRI of the brain pending , rule out stroke, PRES Ammonia 41, lactic acid 1.1, ABG pH of 7.45, P PCO2 35,CBG 150    Acute on chronic respiratory failure Patient has been on 3 L of oxygen since April Multiple admissions prior to this Recently found to have pericarditis which was treated with colchicine Cardiac cath was negative Echo showed a small pericardial effusion Also seen by pulmonary during previous admission, who recommended outpatient rheumatology evaluation as well as consideration of BAL and possibly VATS biopsy if she continued to have bibasilar infiltrates due to concern for  possible immune inflammatory disease.pulmonary also recommended to Follow serum histone antiody, follow CT chest in 8 weeks  Patient also recently had a barium swallow which showed nonspecific  Esophageal motility disorder  Tachycardia probably due to not taking metoprolol this AM Received metoprolol 5 mg IV push, continue oral metoprolol   hypertension Holding Micardis When necessary hydralazine ordered Continue metoprolol, IV metoprolol until by mouth intake is established  Elevated trop probably due to renal insufficiency vs demand ischemia Trop I q6h x3 D dimer, if positive then VQ scan  Check cardiac echo Cont Aspirin 81mg  po qday Cont Metoprolol 25mg  po bid Cont Crestor 40mg  po qhs  (may need renal dosing)  troponin 0.07, 0.06, 0.16   Chest pain reproducible with palpation Since has trop elevation, recent cardiac cath was negative, discussed with Dr. Cathie Olden , no need for a formal consultation this admission  no indication for repeat cardiology evaluation VQ scan negative for PE  ARF, likely prerenal versus due to ARB  Creatinine was 1.17 on 7/5, now 1.86>1.69 Urine sodium 21 Renal ultrasound within normal limits STOP Micardis Hydrate with ns iv Check cmp in am   Anemia Hemoglobin stable    Dm2 fsbs q4h, ISS, hemoglobin A1c 5.6 on 10/20/17  Hypothyroidism Cont Levothyroxine 75 micrograms po qday TSH 11.7>6.755 , free T4  1.21  Gerd Cont PPI  ? Asthma/OSA  Cont Symbicort Cont Cpap  Glaucoma Cont home eye drops  UTI Rocephin 1gm iv qday, follow urine culture closely  Diarrhea 2 days Apparently started last couple of days C. Difficile  negative  DVT prophylaxsis  Lovenox  Code Status:   Full code    Family Communication: Discussed in detail with the patient, all imaging results, lab results explained to the patient   Disposition Plan:   Anticipate she will be here for another 2-3 days       Consultants:     cardiology  Procedures:  none  Antibiotics: Anti-infectives (From admission, onward)   Start     Dose/Rate Route Frequency Ordered Stop   11/17/17 2300  cefTRIAXone (ROCEPHIN) 1 g in sodium chloride 0.9 % 100 mL IVPB     1 g 200 mL/hr over 30 Minutes Intravenous Every 24 hours 11/17/17 2214           HPI/Subjective: Unresponsive this morning, woke up with  Ammonia after neurology came to assess her, became unresponsive again  Objective: Vitals:   11/19/17 0700 11/19/17 0900 11/19/17 0904 11/19/17 1155  BP:  (!) 184/125 (!) 193/137 (!) 157/99  Pulse:  86    Resp: (!) 42 (!) 39  (!) 34  Temp:  99.2 F (37.3 C)  98.3 F (36.8 C)  TempSrc:  Axillary  Axillary  SpO2: 97% 97%  96%  Weight:      Height:        Intake/Output Summary (Last 24 hours) at 11/19/2017 1213 Last data filed at 11/19/2017 0800 Gross per 24 hour  Intake 0 ml  Output 1100 ml  Net -1100 ml    Exam:  Examination:  General exam: unarousable Respiratory system: Clear to auscultation. Respiratory effort normal. Cardiovascular system: S1 & S2 heard, RRR. No JVD, murmurs, rubs, gallops or clicks. No pedal edema. Gastrointestinal system: Abdomen is nondistended, soft and nontender. No organomegaly or masses felt. Normal bowel sounds heard. Central nervous system:somnolent. No focal neurological deficits. Extremities: Symmetric 5 x 5 power. Skin: No rashes, lesions or ulcers      Data Reviewed: I have personally reviewed following labs and imaging studies  Micro Results Recent Results (from the past 240 hour(s))  C difficile quick scan w PCR reflex     Status: None   Collection Time: 11/18/17  3:26 PM  Result Value Ref Range Status   C Diff antigen NEGATIVE NEGATIVE Final   C Diff toxin NEGATIVE NEGATIVE Final   C Diff interpretation No C. difficile detected.  Final    Radiology Reports Dg Chest 2 View  Result Date: 11/17/2017 CLINICAL DATA:  Dyspnea. EXAM: CHEST - 2 VIEW COMPARISON:   11/12/2017 FINDINGS: Cardiomegaly with aortic atherosclerosis. No aneurysm. Mild interstitial edema and bibasilar atelectasis is noted. Thoracic spondylosis is identified with osteoarthritis of the included shoulders. IMPRESSION: Cardiomegaly with aortic atherosclerosis and mild interstitial edema. Bibasilar atelectasis. Electronically Signed   By: Ashley Royalty M.D.   On: 11/17/2017 17:17   Dg Chest 2 View  Result Date: 11/12/2017 CLINICAL DATA:  67 year old female with a history of left chest pain EXAM: CHEST - 2 VIEW COMPARISON:  11/02/2017, 10/22/2017, CT 10/20/2017 FINDINGS: Cardiomediastinal silhouette is unchanged in size and contour, enlarged. Low lung volumes. Interstitial opacities with no pneumothorax. Opacity in the costophrenic sulcus on the lateral view. No large confluent airspace disease. No displaced fracture. IMPRESSION: Similar appearance of interstitial opacities at the lung bases may reflect a combination of edema, atelectasis/consolidation, and/or chronic lung changes. Trace pleural effusions not excluded. Electronically Signed   By: Corrie Mckusick D.O.   On: 11/12/2017 10:23   Dg Chest 2 View  Result Date: 11/02/2017 CLINICAL DATA:  Increasing shortness of  breath and chest pain EXAM: CHEST - 2 VIEW COMPARISON:  10/22/2017 FINDINGS: Cardiomegaly is again identified. Mild vascular congestion is again seen with mild interstitial edema. No focal confluent infiltrate or sizable effusion is noted. IMPRESSION: Changes of mild CHF Electronically Signed   By: Inez Catalina M.D.   On: 11/02/2017 13:59   Ct Head Wo Contrast  Result Date: 11/19/2017 CLINICAL DATA:  67 year old patient found unresponsive earlier this morning. EXAM: CT HEAD WITHOUT CONTRAST TECHNIQUE: Contiguous axial images were obtained from the base of the skull through the vertex without intravenous contrast. COMPARISON:  None. FINDINGS: Brain: Ventricular system normal in size and appearance for age. Asymmetry in the size of the  LATERAL ventricles is a developmental variant. No significant atrophy for age. No mass lesion. No midline shift. No acute hemorrhage or hematoma. No extra-axial fluid collections. No evidence of acute infarction. No focal brain parenchymal abnormality. Vascular: Severe LEFT and moderate RIGHT carotid siphon atherosclerosis. Moderate LEFT vertebral artery atherosclerosis. No hyperdense vessel. Skull: No skull fracture or other focal osseous abnormality involving the skull. Sinuses/Orbits: Visualized paranasal sinuses, bilateral mastoid air cells and bilateral middle ear cavities well-aerated. Visualized orbits and globes normal in appearance. Other: None. IMPRESSION: No acute intracranial abnormality. Electronically Signed   By: Evangeline Dakin M.D.   On: 11/19/2017 10:54   Dg Esophagus  Result Date: 11/13/2017 CLINICAL DATA:  Dysphagia EXAM: ESOPHOGRAM/BARIUM SWALLOW TECHNIQUE: Single contrast examination was performed using  thin barium. FLUOROSCOPY TIME:  Fluoroscopy Time:  2 minutes Radiation Exposure Index (if provided by the fluoroscopic device): Number of Acquired Spot Images: 0 COMPARISON:  None. FINDINGS: Fluoroscopic evaluation of swallowing demonstrates disruption of primary esophageal peristaltic waves and stasis. No fixed stricture, fold thickening or mass. The patient swallowed a 13 mm barium tablet. This briefly sits in the mid to distal esophagus, likely due to the semi recumbent nature. There appears to be no stricture in this area. Upon standing patient more erect, this passes into the stomach. IMPRESSION: Nonspecific esophageal motility disorder. No visible fixed stricture. Electronically Signed   By: Rolm Baptise M.D.   On: 11/13/2017 15:02   US Renal  Result Date: 11/18/2017 CLINICAL DATA:  Acute renal failure EXAM: RENAL / URINARY TRACT ULTRASOUND COMPLETE COMPARISON:  None. FINDINGS: Right Kidney: Length: 10.5 cm. Echogenicity within normal limits. 1.4 x 0.8 cm anechoic right renal mass  most consistent with a cyst in the upper pole. No hydronephrosis visualized. Left Kidney: Length: 11.15 cm. Echogenicity within normal limits. No mass or hydronephrosis visualized. Bladder: Appears normal for degree of bladder distention. IMPRESSION: No obstructive uropathy. Electronically Signed   By: Kathreen Devoid   On: 11/18/2017 14:10   Nm Pulmonary Perf And Vent  Result Date: 11/18/2017 CLINICAL DATA:  Shortness of breath EXAM: NUCLEAR MEDICINE VENTILATION - PERFUSION LUNG SCAN VIEWS: Anterior, posterior, right lateral, left lateral, RPO, LPO, RAO, LAO-ventilation and perfusion RADIOPHARMACEUTICALS:  31.3 mCi of Tc-56m DTPA aerosol inhalation and 4.3 mCi Tc81m-MAA IV COMPARISON:  Chest radiograph November 17, 2017 FINDINGS: Ventilation: Radiotracer uptake on the ventilation study is homogeneous and symmetric bilaterally. There are no appreciable ventilation defects. Perfusion: Radiotracer uptake on the perfusion study is homogeneous and symmetric bilaterally. No perfusion defects are appreciable. IMPRESSION: No appreciable ventilation or perfusion defects. This study constitutes a very low probability of pulmonary embolus. Electronically Signed   By: Lowella Grip III M.D.   On: 11/18/2017 09:18   Dg Chest Port 1 View  Result Date: 10/22/2017 CLINICAL DATA:  Pulmonary  edema EXAM: PORTABLE CHEST 1 VIEW COMPARISON:  10/19/2017 FINDINGS: Cardiomegaly with vascular congestion. Increasing perihilar and bibasilar opacities could reflect atelectasis or edema. No visible significant effusions or acute bony abnormality. IMPRESSION: Cardiomegaly with vascular congestion. Increasing perihilar and lower lobe atelectasis and/or edema. Electronically Signed   By: Rolm Baptise M.D.   On: 10/22/2017 12:17   Dg Abd Portable 1v  Result Date: 11/13/2017 CLINICAL DATA:  Abdomen pain EXAM: PORTABLE ABDOMEN - 1 VIEW COMPARISON:  Chest x-ray 11/12/2017 FINDINGS: Mild gaseous enlargement of central small bowel. Scattered  colon gas is present. Calcified phleboliths in the pelvis. IMPRESSION: Mild gaseous enlargement of central small bowel but with colon gas present. Findings could be secondary to mild ileus. If developing or partial bowel obstruction is suspected, radiographic follow-up could be obtained. Electronically Signed   By: Donavan Foil M.D.   On: 11/13/2017 04:04     CBC Recent Labs  Lab 11/13/17 0321 11/17/17 1404 11/18/17 0710 11/19/17 0452  WBC 4.3 6.0 4.8 6.0  HGB 11.1* 11.2* 10.7* 11.9*  HCT 36.6 36.0 35.2* 38.2  PLT 323 305 304 351  MCV 77.4* 75.9* 77.2* 76.4*  MCH 23.5* 23.6* 23.5* 23.8*  MCHC 30.3 31.1 30.4 31.2  RDW 19.9* 20.9* 20.4* 20.8*  LYMPHSABS  --  0.5*  --   --   MONOABS  --  0.7  --   --   EOSABS  --  0.0  --   --   BASOSABS  --  0.0  --   --     Chemistries  Recent Labs  Lab 11/13/17 0321 11/13/17 1011 11/17/17 1452 11/18/17 0710 11/19/17 0452  NA 140  --  140 143 140  K 3.5  --  3.5 3.6 3.5  CL 102  --  102 106 102  CO2 27  --  23 27 22   GLUCOSE 101*  --  125* 106* 182*  BUN 10  --  18 16 15   CREATININE 1.17*  --  1.86* 1.72* 1.69*  CALCIUM 8.8*  --  9.0 8.6* 8.9  AST  --  36  --  82* 69*  ALT  --  21  --  48* 47*  ALKPHOS  --  58  --  58 66  BILITOT  --  0.9  --  0.5 0.9   ------------------------------------------------------------------------------------------------------------------ estimated creatinine clearance is 43.4 mL/min (A) (by C-G formula based on SCr of 1.69 mg/dL (H)). ------------------------------------------------------------------------------------------------------------------ No results for input(s): HGBA1C in the last 72 hours. ------------------------------------------------------------------------------------------------------------------ Recent Labs    11/18/17 0012  CHOL 94  HDL 30*  LDLCALC 42  TRIG 110  CHOLHDL 3.1    ------------------------------------------------------------------------------------------------------------------ Recent Labs    11/18/17 0012  TSH 11.758*   ------------------------------------------------------------------------------------------------------------------ No results for input(s): VITAMINB12, FOLATE, FERRITIN, TIBC, IRON, RETICCTPCT in the last 72 hours.  Coagulation profile No results for input(s): INR, PROTIME in the last 168 hours.  Recent Labs    11/17/17 2023  DDIMER 2.13*    Cardiac Enzymes Recent Labs  Lab 11/17/17 2023 11/18/17 0012 11/18/17 0710 11/19/17 0934  CKMB 6.7*  --   --   --   TROPONINI 0.07* 0.07* 0.06* 0.16*   ------------------------------------------------------------------------------------------------------------------ Invalid input(s): POCBNP   CBG: Recent Labs  Lab 11/18/17 1647 11/18/17 2058 11/19/17 0619 11/19/17 1010 11/19/17 1125  GLUCAP 108* 114* 164* 150* 137*       Studies: Dg Chest 2 View  Result Date: 11/17/2017 CLINICAL DATA:  Dyspnea. EXAM: CHEST - 2 VIEW COMPARISON:  11/12/2017 FINDINGS: Cardiomegaly with aortic atherosclerosis. No aneurysm. Mild interstitial edema and bibasilar atelectasis is noted. Thoracic spondylosis is identified with osteoarthritis of the included shoulders. IMPRESSION: Cardiomegaly with aortic atherosclerosis and mild interstitial edema. Bibasilar atelectasis. Electronically Signed   By: Ashley Royalty M.D.   On: 11/17/2017 17:17   Ct Head Wo Contrast  Result Date: 11/19/2017 CLINICAL DATA:  67 year old patient found unresponsive earlier this morning. EXAM: CT HEAD WITHOUT CONTRAST TECHNIQUE: Contiguous axial images were obtained from the base of the skull through the vertex without intravenous contrast. COMPARISON:  None. FINDINGS: Brain: Ventricular system normal in size and appearance for age. Asymmetry in the size of the LATERAL ventricles is a developmental variant. No significant  atrophy for age. No mass lesion. No midline shift. No acute hemorrhage or hematoma. No extra-axial fluid collections. No evidence of acute infarction. No focal brain parenchymal abnormality. Vascular: Severe LEFT and moderate RIGHT carotid siphon atherosclerosis. Moderate LEFT vertebral artery atherosclerosis. No hyperdense vessel. Skull: No skull fracture or other focal osseous abnormality involving the skull. Sinuses/Orbits: Visualized paranasal sinuses, bilateral mastoid air cells and bilateral middle ear cavities well-aerated. Visualized orbits and globes normal in appearance. Other: None. IMPRESSION: No acute intracranial abnormality. Electronically Signed   By: Evangeline Dakin M.D.   On: 11/19/2017 10:54   US Renal  Result Date: 11/18/2017 CLINICAL DATA:  Acute renal failure EXAM: RENAL / URINARY TRACT ULTRASOUND COMPLETE COMPARISON:  None. FINDINGS: Right Kidney: Length: 10.5 cm. Echogenicity within normal limits. 1.4 x 0.8 cm anechoic right renal mass most consistent with a cyst in the upper pole. No hydronephrosis visualized. Left Kidney: Length: 11.15 cm. Echogenicity within normal limits. No mass or hydronephrosis visualized. Bladder: Appears normal for degree of bladder distention. IMPRESSION: No obstructive uropathy. Electronically Signed   By: Kathreen Devoid   On: 11/18/2017 14:10   Nm Pulmonary Perf And Vent  Result Date: 11/18/2017 CLINICAL DATA:  Shortness of breath EXAM: NUCLEAR MEDICINE VENTILATION - PERFUSION LUNG SCAN VIEWS: Anterior, posterior, right lateral, left lateral, RPO, LPO, RAO, LAO-ventilation and perfusion RADIOPHARMACEUTICALS:  31.3 mCi of Tc-30m DTPA aerosol inhalation and 4.3 mCi Tc5m-MAA IV COMPARISON:  Chest radiograph November 17, 2017 FINDINGS: Ventilation: Radiotracer uptake on the ventilation study is homogeneous and symmetric bilaterally. There are no appreciable ventilation defects. Perfusion: Radiotracer uptake on the perfusion study is homogeneous and symmetric  bilaterally. No perfusion defects are appreciable. IMPRESSION: No appreciable ventilation or perfusion defects. This study constitutes a very low probability of pulmonary embolus. Electronically Signed   By: Lowella Grip III M.D.   On: 11/18/2017 09:18      Lab Results  Component Value Date   HGBA1C 5.6 10/20/2017   HGBA1C 6.8 08/04/2017   HGBA1C 8.5 05/26/2017   Lab Results  Component Value Date   MICROALBUR 7.0 (H) 10/16/2015   LDLCALC 42 11/18/2017   CREATININE 1.69 (H) 11/19/2017       Scheduled Meds: . aspirin EC  81 mg Oral Daily  . enoxaparin (LOVENOX) injection  40 mg Subcutaneous Q24H  . feeding supplement (ENSURE ENLIVE)  237 mL Oral BID BM  . insulin aspart  0-5 Units Subcutaneous QHS  . insulin aspart  0-9 Units Subcutaneous TID WC  . latanoprost  1 drop Both Eyes QHS  . levothyroxine  75 mcg Oral QAC breakfast  . metoprolol tartrate  5 mg Intravenous Once  . metoprolol tartrate  25 mg Oral BID  . mometasone-formoterol  2 puff Inhalation BID  . pantoprazole  40  mg Oral QAC breakfast  . potassium chloride  10 mEq Oral Daily  . rosuvastatin  40 mg Oral Daily   Continuous Infusions: . cefTRIAXone (ROCEPHIN)  IV Stopped (11/17/17 2355)     LOS: 1 day    Time spent: >30 MINS    Reyne Dumas  [facility] Pager [phone]. If 7PM-7AM, please contact night-coverage at [email or web address], password [email or web address] 11/19/2017, 12:13 PM  LOS: 1 day

## 2017-11-19 NOTE — Progress Notes (Signed)
Nutrition Brief Note  Patient identified on the Malnutrition Screening Tool (MST) Report.  Wt Readings from Last 15 Encounters:  11/18/17 255 lb 15.3 oz (116.1 kg)  11/12/17 261 lb 0.4 oz (118.4 kg)  10/24/17 279 lb 4.8 oz (126.7 kg)  10/19/17 287 lb (130.2 kg)  09/11/17 287 lb (130.2 kg)  08/07/17 (!) 300 lb 8 oz (136.3 kg)  08/04/17 (!) 303 lb (137.4 kg)  08/01/17 (!) 306 lb (138.8 kg)  07/29/17 (!) 306 lb (138.8 kg)  07/27/17 (!) 306 lb (138.8 kg)  05/26/17 (!) 307 lb 12.8 oz (139.6 kg)  03/31/17 (!) 311 lb 9.6 oz (141.3 kg)  02/23/17 (!) 307 lb 6.4 oz (139.4 kg)  12/30/16 (!) 305 lb (138.3 kg)  12/23/16 (!) 306 lb (138.8 kg)    Body mass index is 37.8 kg/m. Patient meets criteria for Obesity Class II based on current BMI.   Current diet order is Heart Healthy/Carbohydrate Modified, patient is consuming approximately 50% of meals at this time.   Ensure Enlive supplements ordered BID. Labs and medications reviewed.   No nutrition interventions warranted at this time. If nutrition issues arise, please consult RD.   Arthur Holms, RD, LDN Pager #: [phone] After-Hours Pager #: [phone]

## 2017-11-19 NOTE — Significant Event (Addendum)
Rapid Response Event Note  Overview: Time Called: 0906 Arrival Time: 0915 Event Type: Neurologic, Respiratory, Cardiac  Initial Focused Assessment: Patient not responding to staff this am. Upon my arrival patient lying in bed.  No response to command or voice.  Unable to pry her eyes open.  She does not move her extremities on command but if you lift her arms she will hold them with equal strength.  With oral care she grimaces and will gag but does not open eyes or interact with staff.   BP 193/137  HR 122  RR 35-45  O2 sat 97% Lung sounds clear, decreased bases Regular heart tones  Interventions: ABG done  7.45/36/88/25 10mg  hydralazine given IV  BP 163/107  HR 116 Narcan given IV, no change in mental status Dr Allyson Sabal at bedside Stat head CT Neuro consult Labs done  Plan of Care (if not transferred): RN to call if assistance needed.  Event Summary: Name of Physician Notified: Abrol at [phone]    at          Raliegh Ip

## 2017-11-19 NOTE — Progress Notes (Signed)
Patient found to be unresponsive to voice upon entering room.  Patient was called and sternal rubbed repeatedly without response.  9am BP 184/125, 193/137, HR 110's to 140, 99% on 3L [place], temp 99.7 axillary. RR called. MD paged.  EKG done, CBG 150, CT head done, MRI head, EEG, and labs ordered. Neuro assessed pt. Pt mildly responds to pain and ammonia. Noted to be forcing eyes closed on exam. When using tongue depressor to check gag reflex, pt bites down on stick.  Pt's physical assessment is currently unchanged from initial assessment at 9 am.  Hydralazine and metoprolol administered with decrease in BP/HR thereafter. Will continue to monitor.

## 2017-11-19 NOTE — Telephone Encounter (Signed)
Received Vital Sign Report from [facility]; forwarded to provider for review/SLS 07/11

## 2017-11-19 NOTE — Progress Notes (Addendum)
Pt called out for tylenol around 2330 complaining of ongoing headache. I went to recheck her bp again and pt became upset with me and said she did not want to be bothered. I explained to her the headache maybe due to the high blood pressure we did not resolve earlier and may be getting worse. Charge nurse also came to explain and she wanted [place] to leave. Pt seemed different from baseline. HR is in 120s, respirations between 25-35. Called son, Dannielle Huh, no response. Rapid was paged to have a second look. When approached, pt started hollering.  Rapid managed to get a bp of 222/121. Pt has been diaphoretic and paranoid throughout shift.  Pt also has a bag at bedside and will not allow any staff to get near. She has refused care at this time and wishes to sleep. Will continue to monitor.   Fransico Michael, RN

## 2017-11-19 NOTE — Consult Note (Signed)
   [facility] CM Inpatient Consult   11/19/2017  [name] [birth year] [id number]  Patient is currently active with [facility] for chronic disease management services.  Patient has been engaged by a [employer] and recent history with [facility] CSW and BSW following for personal care services which was for 11/17/17.  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 Admitted for Tachycardia  Our community based plan of care has focused on disease management and community resource support.   Made Inpatient Case Manager aware that [facility] following. Will follow for progress and disposition needs.  Of note, [facility] Care Management services does not replace or interfere with any services that are needed or arranged by inpatient case management or social work.  For additional questions or referrals please contact:   Natividad Brood, RN BSN [facility] Liaison  [phone] business mobile phone Toll free office [phone]

## 2017-11-19 NOTE — Progress Notes (Addendum)
Pts HR was increasing in the 140s, O2 sats were dropping to 70s. Aline August and I went to check in the room. Pt had been soiled in urine with nasal cannula off. We introduced ourselves and explained that she needed her oxygen on. Pt had awaken very upset asking [place] to leave. We explained that we had to clean her up and put her oxygen back on. Pt was holding on to the bag and would not let go. BP was 188/108 MAP 132 HR 140. EKG was done and put in pts chart. After we cleaned her up, we asked her what was in the bag. She stated letters from family, money for son's college tuition, pictures of her grandchildren and some medications she was taking at home. We asked to see the medication bottles. She was hesitant to give them at first but handed them to [place]. There were unknown pills in 2 bottles. We explained to her that all medications from home are to be sent to pharmacy. She signed the form and I sent the medications to pharmacy. Paged attending. Awaiting orders. Pt is resting in bed call bell within reach. Will continue to monitor.   Fransico Michael, RN

## 2017-11-19 NOTE — Progress Notes (Signed)
I was asked to see [name] because she is acting differently tonight than last night by refusing care and making demands.  Upon arrival, pt was awake, oriented to self, place and situation.  She initially said it was June then corrected herself and stated July.  She can move all extremities with equal strength. She denies any pain at this time. When I initially entered the room and turned on the light, I introduced myself and [name] and I were having a normal casual conversation.  I explained to her why I was contacted and that I was there to help. I noticed her pulse oximetry probe was disconnected and I asked if I could reconnect it as I approached the far side of the bed.  [name] then changed her tone and became defensive asking why "we" keep bothering her and "why can't you just leave me alone".  I apologized for seeing her late at night but just reinforced that it is our duty to ensure her safety and monitoring her vital signs was one form of performing that duty.  She quickly made a motion to cover something up on the right side of the bed as I came around the end of the bed.  Next to her is a black and green bag that she was covering up.  I told her I was not here to take anything from her.  I did ask her if she could tell me what was in the bag.  She stated that there was just some of her belongings in there.  I asked if I could see in her bag and she said no.  I asked if I could check her BP just once and she agreed to a BP check.  She asked about her BP. I told her what it was and that it was high. I explained that it was important to treat her for her BP to keep her safe.  She asked me to leave and shut the door when I leave her room.  I informed Lurline.Ferrara RN of my interaction with [name].

## 2017-11-19 NOTE — Progress Notes (Signed)
Reported that patient had valuables in the room. Patient nonverbal at this time no communication with staff. Security called to view patient's belongings. Valuables removed placed in valuables envelope [phone] and with security. Yellow sheet placed in patient's chart along with receipt for patient's home medication stored with pharmacy sheet.   Primary nurse Delsa Sale along with security present in room when envelope was sealed.

## 2017-11-19 NOTE — Consult Note (Addendum)
Requesting Physician: Dr. Allyson Sabal    Chief Complaint: Code stroke/altered mental status  History obtained from: Chart  HPI:                                                                                                                                         [name] is an 67 y.o. female with past medical history of hypertension, hyperlipidemia, diabetes, depression.  Patient initially arrived to the ED secondary to complaints of high blood pressure at home and left upper quadrant versus left lower quadrant chest pain.  Neurology was called for code stroke this morning.  Patient was last seen normal at 12:00 last night and was refusing her medications.  This morning she was found to be nonresponsive to staff.  Patient was given Narcan with no response.  This was the reason code stroke was called.  On arrival patient was nonresponsive but did localize to sternal pain.  When arm was held up patient will leave the arm up in a catatonic-like state.  Ammonia tablet was opened and placed on her nose which patient quickly responded to by sitting up and turning to her right side.  During all this she still remained mute.  Of note per primary MD there is some question if she had taken some of her home medications as well nurse was in the room at 12:00 last night they found some of her home medications in her purse.  Date last known well: Date: 11/19/2017 Time last known well: Time: 00:00 tPA Given: No: Out of window NIH stroke scale 15 Modified Rankin: Rankin Score=0  Past Medical History:  Diagnosis Date  . Anterolisthesis    Grade 1, L4-5  . Bronchospasm 05/28/2012  . CHEST PAIN 11/18/2007  . DEGENERATIVE JOINT DISEASE 10/06/2006  . DEPRESSION 09/26/2008  . DIABETES MELLITUS 10/06/2006  . Diverticulosis    8416,6063  . GERD (gastroesophageal reflux disease) 07/25/2013  . HYPERLIPIDEMIA 01/11/2009  . HYPERTENSION 10/06/2006  . INSOMNIA 09/26/2008  . Internal hemorrhoids   . OBSTRUCTIVE SLEEP  APNEA 06/23/2008   Severe OSA per sleep study 2010, Rx a CPAP  . Pain in joint, multiple sites 11/10/2006  . UNSPECIFIED ANEMIA 12/10/2009  . UTI (urinary tract infection) 11/2017    Past Surgical History:  Procedure Laterality Date  . COLONOSCOPY  08/01/2011   Procedure: COLONOSCOPY;  Surgeon: Inda Castle, MD;  Location: [facility];  Service: Endoscopy;  Laterality: N/A;  . LEFT HEART CATH AND CORONARY ANGIOGRAPHY N/A 10/22/2017   Procedure: LEFT HEART CATH AND CORONARY ANGIOGRAPHY;  Surgeon: Jettie Booze, MD;  Location: [facility];  Service: Cardiovascular;  Laterality: N/A;  . 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 Right knee replacement  2005    Family History  Problem Relation Age of Onset  . Asthma Mother   . Stroke Mother   . Diabetes Other  M, B, S  . Hypertension Sister        M, S,B  . Pancreatic cancer Brother   . Colon cancer Neg Hx   . Prostate cancer Neg Hx   . Breast cancer Neg Hx        Social History:  reports that she quit smoking about 22 years ago. Her smoking use included cigarettes. She has a 0.80 pack-year smoking history. She has never used smokeless tobacco. She reports that she drank alcohol. She reports that she does not use drugs.  Allergies: No Known Allergies  Medications:                                                                                                                           Prior to Admission:  Medications Prior to Admission  Medication Sig Dispense Refill Last Dose  . albuterol (PROVENTIL HFA;VENTOLIN HFA) 108 (90 Base) MCG/ACT inhaler Inhale 2 puffs into the lungs every 4 (four) hours as needed for wheezing. 1 Inhaler 6 11/16/2017 at Unknown time  . aspirin EC 81 MG tablet Take 81 mg by mouth daily.   11/16/2017 at Unknown time  . budesonide-formoterol (SYMBICORT) 160-4.5 MCG/ACT inhaler Inhale 2 puffs into the lungs 2 (two) times daily. 10.2 g 5 11/16/2017 at Unknown time  . colchicine 0.6 MG tablet Take 1 tablet  (0.6 mg total) by mouth 2 (two) times daily. 60 tablet 2 11/16/2017 at Unknown time  . Insulin Lispro Prot & Lispro (HUMALOG MIX 50/50 KWIKPEN) (50-50) 100 UNIT/ML Kwikpen Inject 5 Units into the skin 2 (two) times daily with a meal. And pen needles 2/day   11/16/2017 at Unknown time  . latanoprost (XALATAN) 0.005 % ophthalmic solution USE 1 DROP IN BOTH EYES AT BEDTIME  10 11/16/2017 at Unknown time  . levothyroxine (SYNTHROID, LEVOTHROID) 75 MCG tablet Take 1 tablet (75 mcg total) by mouth daily before breakfast. 30 tablet 0 11/16/2017 at Unknown time  . metoprolol tartrate (LOPRESSOR) 25 MG tablet Take 1 tablet (25 mg total) by mouth 2 (two) times daily. 60 tablet 0 11/16/2017 at 1900  . OXYGEN Inhale 3 L into the lungs continuous.    11/17/2017 at Unknown time  . pantoprazole (PROTONIX) 40 MG tablet Take 1 tablet (40 mg total) by mouth daily before breakfast. 90 tablet 0 11/16/2017 at Unknown time  . potassium chloride (K-DUR,KLOR-CON) 10 MEQ tablet TAKE 1 TABLET(10 MEQ) BY MOUTH DAILY 90 tablet 0 11/16/2017 at Unknown time  . rosuvastatin (CRESTOR) 40 MG tablet Take 1 tablet (40 mg total) by mouth daily. 90 tablet 1 11/16/2017 at 0800  . telmisartan (MICARDIS) 40 MG tablet Take 1 tablet (40 mg total) by mouth daily. 90 tablet 1 11/16/2017 at 0800  . ACCU-CHEK AVIVA PLUS test strip TEST BLOD SUGAR TWICE DAILY AND LANCETS TWICE DAILY 200 each 0 Taking  . ACCU-CHEK SOFTCLIX LANCETS lancets USE TO CHECK BLOOD SUGAR TWICE A DAY 100 each 0 Taking  . BD ULTRA-FINE  PEN NEEDLES 29G X 12.7MM MISC USE TWICE A DAY 100 each 0 Taking   Scheduled: . aspirin EC  81 mg Oral Daily  . enoxaparin (LOVENOX) injection  40 mg Subcutaneous Q24H  . feeding supplement (ENSURE ENLIVE)  237 mL Oral BID BM  . insulin aspart  0-5 Units Subcutaneous QHS  . insulin aspart  0-9 Units Subcutaneous TID WC  . latanoprost  1 drop Both Eyes QHS  . levothyroxine  75 mcg Oral QAC breakfast  . metoprolol tartrate  5 mg Intravenous Once  .  metoprolol tartrate  25 mg Oral BID  . mometasone-formoterol  2 puff Inhalation BID  . pantoprazole  40 mg Oral QAC breakfast  . potassium chloride  10 mEq Oral Daily  . rosuvastatin  40 mg Oral Daily    ROS:                                                                                                                                       History obtained from unobtainable from patient due to Patient is nonverbal at present time     General Examination:                                                                                                      Blood pressure (!) 193/137, pulse (!) 101, temperature 99.2 F (37.3 C), resp. rate (!) 42, height 5\' 9"  (1.753 m), weight 116.1 kg (255 lb 15.3 oz), SpO2 92 %.  Gen: in bed with eyes closed, appears well nourished Eyes: non-injected sclera Cardiac: RRR Resp: non-labored breathing HENT-  Normocephalic, no lesions, without obvious abnormality.  Normal external eye and conjunctiva.   Extremities- Warm, dry and intact Musculoskeletal-no joint tenderness, deformity or swelling Skin-warm and dry, no hyperpigmentation, vitiligo, or suspicious lesions  Neurological Examination Mental Status: Patient is nonverbal, follows no commands-however over time patient did slightly wiggle her fingers to verbal command, when arms are lifted she will hold them in the position they lifted. Cranial Nerves: II: Was not able to assess as patient would forcefully close her eyes and rolled her eyes back.  III,IV, VI: Pupils were reactive, patient crossed midline in both directions V,VII: Face was symmetric VIII: unclear IX,X: uvula rises symmetrically XI: bilateral shoulder shrug XII: midline tongue extension Motor: ammonia inhalant was placed under nose patient quickly sat up turned to her right held onto the bed and lifted her legs antigravity. Moved both sides equally.  Sensory: No response to noxious  stimuli in all 4  exthowever did respond quickly  to noxious stimuli by inhalant Deep Tendon Reflexes:dcreased in LE, 2+ in the upper extremities Cerebellar: Unable to obtain secondary to patient not following commands Gait: Not tested   Lab Results: Basic Metabolic Panel: Recent Labs  Lab 11/13/17 0321 11/17/17 1452 11/18/17 0710 11/19/17 0452  NA 140 140 143 140  K 3.5 3.5 3.6 3.5  CL 102 102 106 102  CO2 27 23 27 22   GLUCOSE 101* 125* 106* 182*  BUN 10 18 16 15   CREATININE 1.17* 1.86* 1.72* 1.69*  CALCIUM 8.8* 9.0 8.6* 8.9    CBC: Recent Labs  Lab 11/13/17 0321 11/17/17 1404 11/18/17 0710 11/19/17 0452  WBC 4.3 6.0 4.8 6.0  NEUTROABS  --  4.7  --   --   HGB 11.1* 11.2* 10.7* 11.9*  HCT 36.6 36.0 35.2* 38.2  MCV 77.4* 75.9* 77.2* 76.4*  PLT 323 305 304 351    Lipid Panel: Recent Labs  Lab 11/18/17 0012  CHOL 94  TRIG 110  HDL 30*  CHOLHDL 3.1  VLDL 22  LDLCALC 42    CBG: Recent Labs  Lab 11/18/17 1126 11/18/17 1647 11/18/17 2058 11/19/17 0619 11/19/17 1010  GLUCAP 87 108* 114* 164* 150*    Imaging: Dg Chest 2 View  Result Date: 11/17/2017 CLINICAL DATA:  Dyspnea. EXAM: CHEST - 2 VIEW COMPARISON:  11/12/2017 FINDINGS: Cardiomegaly with aortic atherosclerosis. No aneurysm. Mild interstitial edema and bibasilar atelectasis is noted. Thoracic spondylosis is identified with osteoarthritis of the included shoulders. IMPRESSION: Cardiomegaly with aortic atherosclerosis and mild interstitial edema. Bibasilar atelectasis. Electronically Signed   By: Ashley Royalty M.D.   On: 11/17/2017 17:17   US Renal  Result Date: 11/18/2017 CLINICAL DATA:  Acute renal failure EXAM: RENAL / URINARY TRACT ULTRASOUND COMPLETE COMPARISON:  None. FINDINGS: Right Kidney: Length: 10.5 cm. Echogenicity within normal limits. 1.4 x 0.8 cm anechoic right renal mass most consistent with a cyst in the upper pole. No hydronephrosis visualized. Left Kidney: Length: 11.15 cm. Echogenicity within normal limits. No mass or hydronephrosis  visualized. Bladder: Appears normal for degree of bladder distention. IMPRESSION: No obstructive uropathy. Electronically Signed   By: Kathreen Devoid   On: 11/18/2017 14:10   Nm Pulmonary Perf And Vent  Result Date: 11/18/2017 CLINICAL DATA:  Shortness of breath EXAM: NUCLEAR MEDICINE VENTILATION - PERFUSION LUNG SCAN VIEWS: Anterior, posterior, right lateral, left lateral, RPO, LPO, RAO, LAO-ventilation and perfusion RADIOPHARMACEUTICALS:  31.3 mCi of Tc-26m DTPA aerosol inhalation and 4.3 mCi Tc21m-MAA IV COMPARISON:  Chest radiograph November 17, 2017 FINDINGS: Ventilation: Radiotracer uptake on the ventilation study is homogeneous and symmetric bilaterally. There are no appreciable ventilation defects. Perfusion: Radiotracer uptake on the perfusion study is homogeneous and symmetric bilaterally. No perfusion defects are appreciable. IMPRESSION: No appreciable ventilation or perfusion defects. This study constitutes a very low probability of pulmonary embolus. Electronically Signed   By: Lowella Grip III M.D.   On: 11/18/2017 09:18    Assessment and plan discussed with with attending physician and they are in agreement.    Etta Quill PA-C [facility] [phone]  11/19/2017, 10:21 AM   I have seen the patient and reviewed the above note.    Assessment: 67 y.o. female found to be nonresponsive this morning.  Exam does have inconsistencies thus further work-up is needed.  Possibilities include seizure, toxic-metabolic encephalopathy, PRES, psychogenic etiology. No lateralizing findings so stroke is less likely.   Stroke Risk Factors - diabetes  mellitus, hyperlipidemia and hypertension  Recommend - EEG - STAT CT of head - MRI of head if CT unrevealing.  - Ammonia, TSH  Roland Rack, MD [facility] [phone]  If 7pm- 7am, please page neurology on call as listed in [place].

## 2017-11-19 NOTE — Procedures (Signed)
History: 67 year old female with altered mental status  Sedation: None  Technique: This is a 21 channel routine scalp EEG performed at the bedside with bipolar and monopolar montages arranged in accordance to the international 10/20 system of electrode placement. One channel was dedicated to EKG recording.    Background: The background consists of intermixed alpha and beta activities. There is a well defined posterior dominant rhythm of 7- 8 Hz that attenuates with eye opening.  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 This was demonstrated multiple times.    Photic stimulation: Physiologic driving is not performed  EEG Abnormalities: None  Clinical Interpretation: This normal EEG is recorded in the waking and drowsy state. There was no seizure or seizure predisposition recorded on this study. Please note that a normal EEG does not preclude the possibility of epilepsy.   Roland Rack, MD [facility] [phone]  If 7pm- 7am, please page neurology on call as listed in [place].

## 2017-11-20 LAB — COMPREHENSIVE METABOLIC PANEL
ALBUMIN: 2.5 g/dL — AB (ref 3.5–5.0)
ALK PHOS: 54 U/L (ref 38–126)
ALT: 39 U/L (ref 0–44)
AST: 65 U/L — ABNORMAL HIGH (ref 15–41)
Anion gap: 14 (ref 5–15)
BUN: 20 mg/dL (ref 8–23)
CALCIUM: 8.7 mg/dL — AB (ref 8.9–10.3)
CHLORIDE: 103 mmol/L (ref 98–111)
CO2: 25 mmol/L (ref 22–32)
Creatinine, Ser: 1.64 mg/dL — ABNORMAL HIGH (ref 0.44–1.00)
GFR calc non Af Amer: 31 mL/min — ABNORMAL LOW (ref >60)
GFR, EST AFRICAN AMERICAN: 36 mL/min — AB (ref >60)
GLUCOSE: 150 mg/dL — AB (ref 70–99)
Potassium: 3.8 mmol/L (ref 3.5–5.1)
SODIUM: 142 mmol/L (ref 135–145)
Total Bilirubin: 0.8 mg/dL (ref 0.3–1.2)
Total Protein: 7.3 g/dL (ref 6.5–8.1)

## 2017-11-20 LAB — GLUCOSE, CAPILLARY
GLUCOSE-CAPILLARY: 114 mg/dL — AB (ref 70–99)
GLUCOSE-CAPILLARY: 137 mg/dL — AB (ref 70–99)
Glucose-Capillary: 141 mg/dL — ABNORMAL HIGH (ref 70–99)
Glucose-Capillary: 192 mg/dL — ABNORMAL HIGH (ref 70–99)

## 2017-11-20 LAB — CBC
HEMATOCRIT: 35.8 % — AB (ref 36.0–46.0)
HEMOGLOBIN: 11 g/dL — AB (ref 12.0–15.0)
MCH: 23.5 pg — AB (ref 26.0–34.0)
MCHC: 30.7 g/dL (ref 30.0–36.0)
MCV: 76.5 fL — AB (ref 78.0–100.0)
Platelets: 300 10*3/uL (ref 150–400)
RBC: 4.68 MIL/uL (ref 3.87–5.11)
RDW: 20.3 % — ABNORMAL HIGH (ref 11.5–15.5)
WBC: 6.4 10*3/uL (ref 4.0–10.5)

## 2017-11-20 MED ORDER — SODIUM CHLORIDE 0.9 % IV BOLUS: 500.0000 mL | Freq: Once | INTRAVENOUS | Status: DC

## 2017-11-20 MED ORDER — LEVOTHYROXINE SODIUM 75 MCG PO TABS: 75.0000 ug | ORAL_TABLET | Freq: Every day | ORAL | 5 refills | Status: DC

## 2017-11-20 MED ORDER — POTASSIUM CHLORIDE CRYS ER 10 MEQ PO TBCR: 10.0000 meq | EXTENDED_RELEASE_TABLET | Freq: Every day | ORAL | 5 refills | Status: DC

## 2017-11-20 MED ORDER — ALBUTEROL SULFATE HFA 108 (90 BASE) MCG/ACT IN AERS: 2.0000 | INHALATION_SPRAY | RESPIRATORY_TRACT | 6 refills | Status: DC | PRN

## 2017-11-20 MED ORDER — ROSUVASTATIN CALCIUM 40 MG PO TABS: 40.0000 mg | ORAL_TABLET | Freq: Every day | ORAL | 5 refills | Status: DC

## 2017-11-20 MED ORDER — ACETAMINOPHEN 325 MG PO TABS: 650.0000 mg | ORAL_TABLET | Freq: Four times a day (QID) | ORAL | 1 refills | Status: DC | PRN

## 2017-11-20 MED ORDER — COLCHICINE 0.6 MG PO TABS: 0.6000 mg | ORAL_TABLET | Freq: Two times a day (BID) | ORAL | 5 refills | Status: DC

## 2017-11-20 MED ORDER — LATANOPROST 0.005 % OP SOLN: OPHTHALMIC | 10 refills | Status: DC

## 2017-11-20 MED ORDER — BUDESONIDE-FORMOTEROL FUMARATE 160-4.5 MCG/ACT IN AERO: 2.0000 | INHALATION_SPRAY | Freq: Two times a day (BID) | RESPIRATORY_TRACT | 5 refills | Status: DC

## 2017-11-20 MED ORDER — INSULIN LISPRO PROT & LISPRO (50-50 MIX) 100 UNIT/ML KWIKPEN: 5.0000 [IU] | PEN_INJECTOR | Freq: Two times a day (BID) | SUBCUTANEOUS | 11 refills | Status: DC

## 2017-11-20 MED ORDER — METOPROLOL TARTRATE 25 MG PO TABS: 25.0000 mg | ORAL_TABLET | Freq: Two times a day (BID) | ORAL | 5 refills | Status: DC

## 2017-11-20 MED ORDER — ASPIRIN EC 81 MG PO TBEC: 81.0000 mg | DELAYED_RELEASE_TABLET | Freq: Every day | ORAL | 5 refills | Status: DC

## 2017-11-20 MED ORDER — TELMISARTAN 40 MG PO TABS
40.0000 mg | ORAL_TABLET | Freq: Every day | ORAL | 5 refills | Status: DC
Start: 2017-11-20 — End: 2017-11-20

## 2017-11-20 NOTE — Discharge Instructions (Signed)
1)Generalized weakness and debility/fall risk--- in-home physical therapy  2) take medications as prescribed  3) follow-up with your regular doctor in about a week for recheck, including blood pressure recheck  Please note  You were cared for by a hospitalist Physician during your hospital stay. Once you are discharged, your primary care physician will handle any further medical issues. Please note that NO REFILLS for any discharge medications will be authorized once you are discharged, as it is imperative that you return to your primary care physician (or establish a relationship with a primary care physician if you do not have one) for your aftercare needs so that they can reassess your need for medications and monitor your lab values

## 2017-11-20 NOTE — Progress Notes (Signed)
Patient returned to baseline yesterday afternoon.  EEG and MRI are normal.  Today she is awake, alert, oriented.  I am not certain of the etiology of her difficulty in arousal, but the response to ammonia capsule, prominent Bell's phenomena with eye-opening are suggestive that there may have been a nonorganic etiology.  With normal EEG and MRI, I do not think I would do any further testing.  If he does have further events, then we can reconsider.  Please call with further questions or concerns  Roland Rack, MD [facility] [phone]  If 7pm- 7am, please page neurology on call as listed in [place].

## 2017-11-20 NOTE — Discharge Summary (Signed)
[name], is a 67 y.o. female  DOB December 10, 1950  MRN [id number].  Admission date:  11/17/2017  Admitting Physician  Jani Gravel, MD  Discharge Date:  11/20/2017   Primary MD  Colon Branch, MD  Recommendations for primary care physician for things to follow:   1)Generalized weakness and debility/fall risk--- in-home physical therapy  2) take medications as prescribed  3) follow-up with your regular doctor in about a week for recheck, including blood pressure recheck  Please note  You were cared for by a hospitalist Physician during your hospital stay. Once you are discharged, your primary care physician will handle any further medical issues. Please note that NO REFILLS for any discharge medications will be authorized once you are discharged, as it is imperative that you return to your primary care physician (or establish a relationship with a primary care physician if you do not have one) for your aftercare needs so that they can reassess your need for medications and monitor your lab values    Admission Diagnosis  Tachycardia [R00.0]   Discharge Diagnosis  Tachycardia [R00.0]    Principal Problem:   Tachycardia Active Problems:   DM II (diabetes mellitus, type II), controlled (HCC)   Anemia   Chest pain   ARF (acute renal failure) (HCC)   Elevated troponin   Acute hypoxemic respiratory failure ([place])      Past Medical History:  Diagnosis Date  . Anterolisthesis    Grade 1, L4-5  . Bronchospasm 05/28/2012  . CHEST PAIN 11/18/2007  . DEGENERATIVE JOINT DISEASE 10/06/2006  . DEPRESSION 09/26/2008  . DIABETES MELLITUS 10/06/2006  . Diverticulosis    5465,6812  . GERD (gastroesophageal reflux disease) 07/25/2013  . HYPERLIPIDEMIA 01/11/2009  . HYPERTENSION 10/06/2006  . INSOMNIA 09/26/2008  . Internal hemorrhoids   . OBSTRUCTIVE SLEEP APNEA 06/23/2008   Severe OSA per sleep study 2010, Rx a  CPAP  . Pain in joint, multiple sites 11/10/2006  . UNSPECIFIED ANEMIA 12/10/2009  . UTI (urinary tract infection) 11/2017    Past Surgical History:  Procedure Laterality Date  . COLONOSCOPY  08/01/2011   Procedure: COLONOSCOPY;  Surgeon: Inda Castle, MD;  Location: [facility];  Service: Endoscopy;  Laterality: N/A;  . LEFT HEART CATH AND CORONARY ANGIOGRAPHY N/A 10/22/2017   Procedure: LEFT HEART CATH AND CORONARY ANGIOGRAPHY;  Surgeon: Jettie Booze, MD;  Location: [facility];  Service: Cardiovascular;  Laterality: N/A;  . 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 Right knee replacement  2005       HPI  from the history and physical done on the day of admission:   [name]  is a 67 y.o. female, w hypertension, hyperlipidemia, dm2, ? Drug induced lupus,  OSA on cpap apparently c/o high bp at home and therefore presented to ED, and then was noted to be tachycardic, and to have left upper quadrant vs left lower chest pain.  Pain with palpation.  Slight nausea and heartburn,  Pt denies radiation of the  pain.  Slight sob.  Denies fever, chills, cough, palp, diarrhea, brbpr.  Pt denies NSAIDS.   In ED,  CXR IMPRESSION: Cardiomegaly with aortic atherosclerosis and mild interstitial edema. Bibasilar atelectasis.  Urinalysis wbc >50  BNP 149.3 Trop 0.07 Wbc 6.0, Hgb 11.2, Plt 305 Na 140 K 3.5  Bun 18, creatinine 1.86   EKG ST at 130, nl axis, poor R progression  Pt will be admitted for w/up of tachycardia, left sided chest pain and UTI        Hospital Course:   Brief summary  67 y.o.female,w hypertension, hyperlipidemia, dm2,? Drug induced lupus,OSA on cpap apparently c/o high bp at home and therefore presented to ED,  was noted to be tachycardic,  Also had left upper quadrant vs left lower chest pain. Patient   found to have a urinary tract infection/history of present illness , diarrhea during this hospitalization. C. Difficile negative. Rule out for  PE in the setting of a sinus tachycardia. Then had an unresponsive episode on 7/11. Hypertensive during this episode with systolic blood pressure greater than 220.Code stroke  Was initiated and neurology consulted. Anticipate discharge to 3 days,    Plan:- Assessment and plan Unresponsive episode----transient encephalopathy of unclear etiology - patient has been back to baseline since the afternoon of 11/19/2017, EEG unremarkable, MRI brain unremarkable, discussed with Dr. Leonel Ramsay from neurology service, okay to discharge home, PRN follow-up if further events She responded to ammonia capsules, prominent balance phenomenon with eye-opening suggestive of possible nonorganic etiology . CT head negative for stroke acute changes Ammonia 41, lactic acid 1.1, ABG pH of 7.45, P PCO2 35,CBG 150    Acute on chronic hypoxic respiratory failure-stable, appears to be at baseline, continue supplemental oxygen, Patient has been on 3 L of oxygen since April Recently found to have pericarditis which was treated with colchicine Cardiac cath was negative, Echo showed a small pericardial effusion Also seen by pulmonary during previous admission, who recommended outpatient rheumatology evaluation as well as consideration of BAL and possibly VATS biopsy if she continued to have bibasilar infiltrates due to concern for possible immune inflammatory disease.pulmonary also recommended to Follow serum histone antiody, follow CT chest in 8 weeks  Patient also recently had a barium swallow which showed nonspecific  Esophageal motility disorder   hypertension--stable, resume metoprolol, stop Micardis due to kidney concerns   Elevated trop probably due to renal insufficiency vs demand ischemia- Troponin is flat,  no ACS type symptoms or pattern at this time, Cont Aspirin 81mg  po qday Cont Metoprolol 25mg  po bid Cont Crestor 40mg  po qhs (may need renal dosing)  troponin 0.07, 0.06, 0.16   Chest pain reproducible  with palpation- Since has trop elevation, recent cardiac cath was negative, discussed with Dr. Cathie Olden , no need for a formal consultation this admission  no indication for repeat cardiology evaluation,VQ scan negative for PE  ARF, likely prerenal versus due to ARB - Creatinine was 1.17 on 7/5, now 1.86>1.69 Urine sodium 21, Renal ultrasound within normal limits, STOP Micardis due to kidney concerns, creatinine stable for the last couple of days, recheck BMP with PCP post discharge   Anemia Hemoglobin stable    Dm2  hemoglobin A1c 5.6 on 10/20/17  Hypothyroidism Cont Levothyroxine 75 micrograms po qday TSH 11.7>6.755 , free T4  1.21  Gerd Cont PPI  ? Asthma/OSA  Cont Symbicort Cont Cpap  Glaucoma Cont home eye drops   Diarrhea 2 days- Apparently started last couple of days C. Difficile  negative,  diarrhea is resolved    Code Status:   Full code    Disposition Plan:    Home with home health services   Discharge Condition: stable  Follow UP-PCP as outpatient for repeat BMP Follow-up Information    [place] [place] Follow up.   Specialty:  Home Health Services Why:  [place] services resumed- HHRN/PT Contact information: [address] [id number] [phone]            Consults obtained -  Dr Allyson Sabal d/w Dr Acie Fredrickson (cardiology), also d/w neurologist  Diet and Activity recommendation:  As advised  Discharge Instructions     Discharge Instructions    Call MD for:  difficulty breathing, headache or visual disturbances   Complete by:  As directed    Call MD for:  persistant dizziness or light-headedness   Complete by:  As directed    Call MD for:  persistant nausea and vomiting   Complete by:  As directed    Call MD for:  redness, tenderness, or signs of infection (pain, swelling, redness, odor or green/yellow discharge around incision site)   Complete by:  As directed    Call MD for:  severe uncontrolled pain    Complete by:  As directed    Call MD for:  temperature >100.4   Complete by:  As directed    Diet - low sodium heart healthy   Complete by:  As directed    Discharge instructions   Complete by:  As directed    1)Generalized weakness and debility/fall risk--- in-home physical therapy  2) take medications as prescribed  3) follow-up with your regular doctor in about a week for recheck, including blood pressure recheck  Please note  You were cared for by a hospitalist Physician during your hospital stay. Once you are discharged, your primary care physician will handle any further medical issues. Please note that NO REFILLS for any discharge medications will be authorized once you are discharged, as it is imperative that you return to your primary care physician (or establish a relationship with a primary care physician if you do not have one) for your aftercare needs so that they can reassess your need for medications and monitor your lab values   Increase activity slowly   Complete by:  As directed         Discharge Medications     Allergies as of 11/20/2017   No Known Allergies     Medication List    STOP taking these medications   telmisartan 40 MG tablet Commonly known as:  MICARDIS     TAKE these medications   ACCU-CHEK AVIVA PLUS test strip Generic drug:  glucose blood TEST BLOD SUGAR TWICE DAILY AND LANCETS TWICE DAILY   ACCU-CHEK SOFTCLIX LANCETS lancets USE TO CHECK BLOOD SUGAR TWICE A DAY   acetaminophen 325 MG tablet Commonly known as:  TYLENOL Take 2 tablets (650 mg total) by mouth every 6 (six) hours as needed for mild pain (or Fever >/= 101).   albuterol 108 (90 Base) MCG/ACT inhaler Commonly known as:  PROVENTIL HFA;VENTOLIN HFA Inhale 2 puffs into the lungs every 4 (four) hours as needed for wheezing.   aspirin EC 81 MG tablet Take 1 tablet (81 mg total) by mouth daily. With food What changed:  additional instructions   BD ULTRA-FINE PEN NEEDLES  29G X 12.7MM Misc Generic drug:  Insulin Pen Needle USE TWICE A DAY   budesonide-formoterol 160-4.5 MCG/ACT inhaler  Commonly known as:  SYMBICORT Inhale 2 puffs into the lungs 2 (two) times daily.   colchicine 0.6 MG tablet Take 1 tablet (0.6 mg total) by mouth 2 (two) times daily.   Insulin Lispro Prot & Lispro (50-50) 100 UNIT/ML Kwikpen Commonly known as:  HUMALOG MIX 50/50 KWIKPEN Inject 5 Units into the skin 2 (two) times daily with a meal. And pen needles 2/day   latanoprost 0.005 % ophthalmic solution Commonly known as:  XALATAN USE 1 DROP IN BOTH EYES AT BEDTIME   levothyroxine 75 MCG tablet Commonly known as:  SYNTHROID, LEVOTHROID Take 1 tablet (75 mcg total) by mouth daily before breakfast.   metoprolol tartrate 25 MG tablet Commonly known as:  LOPRESSOR Take 1 tablet (25 mg total) by mouth 2 (two) times daily.   OXYGEN Inhale 3 L into the lungs continuous.   pantoprazole 40 MG tablet Commonly known as:  PROTONIX Take 1 tablet (40 mg total) by mouth daily before breakfast.   potassium chloride 10 MEQ tablet Commonly known as:  K-DUR,KLOR-CON Take 1 tablet (10 mEq total) by mouth daily. What changed:  See the new instructions.   rosuvastatin 40 MG tablet Commonly known as:  CRESTOR Take 1 tablet (40 mg total) by mouth daily.       Major procedures and Radiology Reports - PLEASE review detailed and final reports for all details, in brief -    Dg Chest 2 View  Result Date: 11/17/2017 CLINICAL DATA:  Dyspnea. EXAM: CHEST - 2 VIEW COMPARISON:  11/12/2017 FINDINGS: Cardiomegaly with aortic atherosclerosis. No aneurysm. Mild interstitial edema and bibasilar atelectasis is noted. Thoracic spondylosis is identified with osteoarthritis of the included shoulders. IMPRESSION: Cardiomegaly with aortic atherosclerosis and mild interstitial edema. Bibasilar atelectasis. Electronically Signed   By: Ashley Royalty M.D.   On: 11/17/2017 17:17   Dg Chest 2 View  Result  Date: 11/12/2017 CLINICAL DATA:  67 year old female with a history of left chest pain EXAM: CHEST - 2 VIEW COMPARISON:  11/02/2017, 10/22/2017, CT 10/20/2017 FINDINGS: Cardiomediastinal silhouette is unchanged in size and contour, enlarged. Low lung volumes. Interstitial opacities with no pneumothorax. Opacity in the costophrenic sulcus on the lateral view. No large confluent airspace disease. No displaced fracture. IMPRESSION: Similar appearance of interstitial opacities at the lung bases may reflect a combination of edema, atelectasis/consolidation, and/or chronic lung changes. Trace pleural effusions not excluded. Electronically Signed   By: Corrie Mckusick D.O.   On: 11/12/2017 10:23   Dg Chest 2 View  Result Date: 11/02/2017 CLINICAL DATA:  Increasing shortness of breath and chest pain EXAM: CHEST - 2 VIEW COMPARISON:  10/22/2017 FINDINGS: Cardiomegaly is again identified. Mild vascular congestion is again seen with mild interstitial edema. No focal confluent infiltrate or sizable effusion is noted. IMPRESSION: Changes of mild CHF Electronically Signed   By: Inez Catalina M.D.   On: 11/02/2017 13:59   Ct Head Wo Contrast  Result Date: 11/19/2017 CLINICAL DATA:  67 year old patient found unresponsive earlier this morning. EXAM: CT HEAD WITHOUT CONTRAST TECHNIQUE: Contiguous axial images were obtained from the base of the skull through the vertex without intravenous contrast. COMPARISON:  None. FINDINGS: Brain: Ventricular system normal in size and appearance for age. Asymmetry in the size of the LATERAL ventricles is a developmental variant. No significant atrophy for age. No mass lesion. No midline shift. No acute hemorrhage or hematoma. No extra-axial fluid collections. No evidence of acute infarction. No focal brain parenchymal abnormality. Vascular: Severe LEFT and moderate RIGHT carotid siphon  atherosclerosis. Moderate LEFT vertebral artery atherosclerosis. No hyperdense vessel. Skull: No skull fracture  or other focal osseous abnormality involving the skull. Sinuses/Orbits: Visualized paranasal sinuses, bilateral mastoid air cells and bilateral middle ear cavities well-aerated. Visualized orbits and globes normal in appearance. Other: None. IMPRESSION: No acute intracranial abnormality. Electronically Signed   By: Evangeline Dakin M.D.   On: 11/19/2017 10:54   Mr Brain Wo Contrast  Result Date: 11/19/2017 CLINICAL DATA:  67 y/o  F; episode of unresponsiveness. EXAM: MRI HEAD WITHOUT CONTRAST TECHNIQUE: Multiplanar, multiecho pulse sequences of the brain and surrounding structures were obtained without intravenous contrast. COMPARISON:  11/19/2017 CT head. FINDINGS: Brain: No acute infarction, hemorrhage, hydrocephalus, extra-axial collection or mass lesion. Few nonspecific foci of T2 FLAIR hyperintense signal abnormality in subcortical and periventricular white matter are compatible with mild chronic microvascular ischemic changes for age. Mild brain parenchymal volume loss. Vascular: Normal flow voids. Skull and upper cervical spine: Normal marrow signal. Sinuses/Orbits: Negative. Other: None. IMPRESSION: 1. No acute intracranial abnormality identified. 2. Mild chronic microvascular ischemic changes and parenchymal volume loss of the brain. Electronically Signed   By: Kristine Garbe M.D.   On: 11/19/2017 19:36   Dg Esophagus  Result Date: 11/13/2017 CLINICAL DATA:  Dysphagia EXAM: ESOPHOGRAM/BARIUM SWALLOW TECHNIQUE: Single contrast examination was performed using  thin barium. FLUOROSCOPY TIME:  Fluoroscopy Time:  2 minutes Radiation Exposure Index (if provided by the fluoroscopic device): Number of Acquired Spot Images: 0 COMPARISON:  None. FINDINGS: Fluoroscopic evaluation of swallowing demonstrates disruption of primary esophageal peristaltic waves and stasis. No fixed stricture, fold thickening or mass. The patient swallowed a 13 mm barium tablet. This briefly sits in the mid to distal  esophagus, likely due to the semi recumbent nature. There appears to be no stricture in this area. Upon standing patient more erect, this passes into the stomach. IMPRESSION: Nonspecific esophageal motility disorder. No visible fixed stricture. Electronically Signed   By: Rolm Baptise M.D.   On: 11/13/2017 15:02   US Renal  Result Date: 11/18/2017 CLINICAL DATA:  Acute renal failure EXAM: RENAL / URINARY TRACT ULTRASOUND COMPLETE COMPARISON:  None. FINDINGS: Right Kidney: Length: 10.5 cm. Echogenicity within normal limits. 1.4 x 0.8 cm anechoic right renal mass most consistent with a cyst in the upper pole. No hydronephrosis visualized. Left Kidney: Length: 11.15 cm. Echogenicity within normal limits. No mass or hydronephrosis visualized. Bladder: Appears normal for degree of bladder distention. IMPRESSION: No obstructive uropathy. Electronically Signed   By: Kathreen Devoid   On: 11/18/2017 14:10   Nm Pulmonary Perf And Vent  Result Date: 11/18/2017 CLINICAL DATA:  Shortness of breath EXAM: NUCLEAR MEDICINE VENTILATION - PERFUSION LUNG SCAN VIEWS: Anterior, posterior, right lateral, left lateral, RPO, LPO, RAO, LAO-ventilation and perfusion RADIOPHARMACEUTICALS:  31.3 mCi of Tc-44m DTPA aerosol inhalation and 4.3 mCi Tc47m-MAA IV COMPARISON:  Chest radiograph November 17, 2017 FINDINGS: Ventilation: Radiotracer uptake on the ventilation study is homogeneous and symmetric bilaterally. There are no appreciable ventilation defects. Perfusion: Radiotracer uptake on the perfusion study is homogeneous and symmetric bilaterally. No perfusion defects are appreciable. IMPRESSION: No appreciable ventilation or perfusion defects. This study constitutes a very low probability of pulmonary embolus. Electronically Signed   By: Lowella Grip III M.D.   On: 11/18/2017 09:18   Dg Chest Port 1 View  Result Date: 10/22/2017 CLINICAL DATA:  Pulmonary edema EXAM: PORTABLE CHEST 1 VIEW COMPARISON:  10/19/2017 FINDINGS:  Cardiomegaly with vascular congestion. Increasing perihilar and bibasilar opacities could reflect atelectasis or  edema. No visible significant effusions or acute bony abnormality. IMPRESSION: Cardiomegaly with vascular congestion. Increasing perihilar and lower lobe atelectasis and/or edema. Electronically Signed   By: Rolm Baptise M.D.   On: 10/22/2017 12:17   Dg Abd Portable 1v  Result Date: 11/13/2017 CLINICAL DATA:  Abdomen pain EXAM: PORTABLE ABDOMEN - 1 VIEW COMPARISON:  Chest x-ray 11/12/2017 FINDINGS: Mild gaseous enlargement of central small bowel. Scattered colon gas is present. Calcified phleboliths in the pelvis. IMPRESSION: Mild gaseous enlargement of central small bowel but with colon gas present. Findings could be secondary to mild ileus. If developing or partial bowel obstruction is suspected, radiographic follow-up could be obtained. Electronically Signed   By: Donavan Foil M.D.   On: 11/13/2017 04:04    Micro Results    Recent Results (from the past 240 hour(s))  C difficile quick scan w PCR reflex     Status: None   Collection Time: 11/18/17  3:26 PM  Result Value Ref Range Status   C Diff antigen NEGATIVE NEGATIVE Final   C Diff toxin NEGATIVE NEGATIVE Final   C Diff interpretation No C. difficile detected.  Final  Stool culture (children & immunocomp patients)     Status: None (Preliminary result)   Collection Time: 11/18/17  3:26 PM  Result Value Ref Range Status   Salmonella/Shigella Screen Final report  Final   Campylobacter Culture PENDING  Incomplete   E coli, Shiga toxin Assay Negative Negative Final    Comment: (NOTE) Performed At: [facility] [place], [place] [id number] Rush Farmer MD TD:[id number]   STOOL CULTURE REFLEX - RSASHR     Status: None   Collection Time: 11/18/17  3:26 PM  Result Value Ref Range Status   Stool Culture result 1 (RSASHR) Comment  Final    Comment: (NOTE) No Salmonella or Shigella recovered. Performed  At: [facility] [place], [place] [id number] Rush Farmer MD YQ:[id number]        Today   Subjective    [name] today has no new complaints, resting comfortably, no new neuro concerns, eating and drinking well, no shortness of breath at rest, ambulated without significant dyspnea on exertion , or dizziness or palpitations          Patient has been seen and examined prior to discharge   Objective   Blood pressure 126/89, pulse (!) 103, temperature 98.5 F (36.9 C), temperature source Oral, resp. rate (!) 29, height 5\' 9"  (1.753 m), weight 116.1 kg (255 lb 15.3 oz), SpO2 95 %.   Intake/Output Summary (Last 24 hours) at 11/20/2017 1756 Last data filed at 11/19/2017 1818 Gross per 24 hour  Intake -  Output 1000 ml  Net -1000 ml    Exam Gen:- Awake Alert, in no acute distress HEENT:- [place].AT, No sclera icterus Neck-Supple Neck,No JVD,.  Lungs-  CTAB , fairly symmetrical air movement CV- S1, S2 normal Abd-  +ve B.Sounds, Abd Soft, No tenderness,    Extremity/Skin:- No  edema,  good pulses Psych-affect is appropriate, oriented x3 Neuro-no new focal deficits, no tremors   Data Review   CBC w Diff:  Lab Results  Component Value Date   WBC 6.4 11/20/2017   HGB 11.0 (L) 11/20/2017   HCT 35.8 (L) 11/20/2017   PLT 300 11/20/2017   LYMPHOPCT 8 11/17/2017   MONOPCT 12 11/17/2017   EOSPCT 0 11/17/2017   BASOPCT 0 11/17/2017    CMP:  Lab Results  Component Value Date  NA 142 11/20/2017   K 3.8 11/20/2017   CL 103 11/20/2017   CO2 25 11/20/2017   BUN 20 11/20/2017   CREATININE 1.64 (H) 11/20/2017   CREATININE 0.93 09/01/2014   PROT 7.3 11/20/2017   ALBUMIN 2.5 (L) 11/20/2017   BILITOT 0.8 11/20/2017   ALKPHOS 54 11/20/2017   AST 65 (H) 11/20/2017   ALT 39 11/20/2017  .   Total Discharge time is about 33 minutes  Roxan Hockey M.D on 11/20/2017 at 5:56 PM  [facility]   Office  [phone]

## 2017-11-20 NOTE — Care Management Note (Signed)
Case Management Note Marvetta Gibbons RN, BSN Unit 4E-Case Manager [phone]  Patient Details  Name: [name] MRN: [id number] Date of Birth: 1950/12/12  Subjective/Objective:    Pt admitted with HTN, tachycardia                Action/Plan: PTA pt lived at home, active with [facility]- orders have been placed to resume services- spoke with pt at bedside and confirmed pt address/phone- and HH resumption of services with [facility]. Call made to [facility] with Nanine Means for [facility] resumption of care.   Expected Discharge Date:  11/20/17               Expected Discharge Plan:  [place]  In-House Referral:  NA  Discharge planning Services  CM Consult  Post Acute Care Choice:  Home Health, Resumption of Svcs/PTA Provider Choice offered to:  Patient  DME Arranged:    DME Agency:     HH Arranged:  RN, PT, Nurse's Aide [place] Agency:  [place]  Status of Service:  Completed, signed off  If discussed at [place] of Stay Meetings, dates discussed:    Discharge Disposition: home/home health   Additional Comments:  Dawayne Patricia, RN 11/20/2017, 5:25 PM

## 2017-11-20 NOTE — Consult Note (Addendum)
   [facility] CM Inpatient Consult   11/20/2017  [name] 1951/05/11 [id number]  Came by to follow up on [facility] community needs.  Patient was fast asleep and did not disturb.  Left a brochure and 24 hour nurse advise line at the bedside.  Natividad Brood, RN BSN [facility] Liaison  [phone] business mobile phone Toll free office [phone]   MD came in and woke patient up.  Met with patient regarding ongoing [facility] follow up.  She endorses ongoing follow up.  She states she is not sure she had her meeting with [facility] home care for personal care services because she was "so sick that day."  She states her oxygen at home is 3L/min per [place]. [facility] to continue follow up.  Primary care provider, Velora Heckler is listed to provide the transition of care follow up calls and appointment.   For questions:  Natividad Brood, RN BSN [facility] Liaison  [phone] business mobile phone Toll free office [phone]

## 2017-11-20 NOTE — Plan of Care (Signed)
Pt being d/c home.

## 2017-11-21 ENCOUNTER — Telehealth: Payer: Self-pay | Admitting: Internal Medicine

## 2017-11-21 DIAGNOSIS — E1149 Type 2 diabetes mellitus with other diabetic neurological complication: Secondary | ICD-10-CM | POA: Diagnosis not present

## 2017-11-21 DIAGNOSIS — J449 Chronic obstructive pulmonary disease, unspecified: Secondary | ICD-10-CM | POA: Diagnosis not present

## 2017-11-21 DIAGNOSIS — Z7951 Long term (current) use of inhaled steroids: Secondary | ICD-10-CM | POA: Diagnosis not present

## 2017-11-21 DIAGNOSIS — J9621 Acute and chronic respiratory failure with hypoxia: Secondary | ICD-10-CM | POA: Diagnosis not present

## 2017-11-21 DIAGNOSIS — R262 Difficulty in walking, not elsewhere classified: Secondary | ICD-10-CM | POA: Diagnosis not present

## 2017-11-21 DIAGNOSIS — Z9981 Dependence on supplemental oxygen: Secondary | ICD-10-CM | POA: Diagnosis not present

## 2017-11-21 DIAGNOSIS — Z794 Long term (current) use of insulin: Secondary | ICD-10-CM | POA: Diagnosis not present

## 2017-11-21 DIAGNOSIS — Z7982 Long term (current) use of aspirin: Secondary | ICD-10-CM | POA: Diagnosis not present

## 2017-11-21 DIAGNOSIS — I10 Essential (primary) hypertension: Secondary | ICD-10-CM | POA: Diagnosis not present

## 2017-11-21 DIAGNOSIS — R000 Tachycardia, unspecified: Secondary | ICD-10-CM | POA: Diagnosis not present

## 2017-11-21 DIAGNOSIS — M1990 Unspecified osteoarthritis, unspecified site: Secondary | ICD-10-CM | POA: Diagnosis not present

## 2017-11-21 DIAGNOSIS — J45909 Unspecified asthma, uncomplicated: Secondary | ICD-10-CM | POA: Diagnosis not present

## 2017-11-21 DIAGNOSIS — M6281 Muscle weakness (generalized): Secondary | ICD-10-CM | POA: Diagnosis not present

## 2017-11-21 NOTE — Telephone Encounter (Signed)
Needs f/u from hospital, TCM.

## 2017-11-23 ENCOUNTER — Other Ambulatory Visit: Payer: Self-pay | Admitting: *Deleted

## 2017-11-23 LAB — STOOL CULTURE: E coli, Shiga toxin Assay: NEGATIVE

## 2017-11-23 LAB — STOOL CULTURE REFLEX - CMPCXR

## 2017-11-23 LAB — STOOL CULTURE REFLEX - RSASHR

## 2017-11-23 NOTE — Patient Outreach (Signed)
[facility]) Care Management  11/23/2017  [name] [birth year] [id number]   Member recently admitted to hospital for tachycardia (7/9-7/12).  Noted that primary MD will perform transition of care.  Call placed to member to follow up on current health status, no answer.  HIPAA compliant voice message left.  Will have assigned care manager follow up within the next 4 business days.  Home visit scheduled for next week.  Valente David, [place], MSN [place] [phone]

## 2017-11-23 NOTE — Telephone Encounter (Signed)
Will contact patient today.

## 2017-11-23 NOTE — Telephone Encounter (Signed)
11/23/17   Transition Care Management Follow-up Telephone Call  ADMISSION DATE: 11/17/17 DISCHARGE DATE: 11/20/17  Patient needs BMP repeated.   How have you been since you were released from the hospital? Pain in side continues, SOB (Patient uses 3L/m O2.   Do you understand why you were in the hospital? Yes   Do you understand the discharge instrcutions? Yes    Items Reviewed:  Medications reviewed: Yes   Allergies reviewed: NKDA   Dietary changes reviewed: Low sodium heart healthy   Referrals reviewed: Appointment has already been scheduled for follow Up   Functional Questionnaire:   Activities of Daily Living (ADLs): Patietn needs assistance with all at this time.  Any patient concerns? None at this time   Confirmed importance and date/time of follow-up visits scheduled: Yes    Confirmed with patient if condition begins to worsen call PCP or go to the ER. Yes     Patient was given the office number and encouragred to call back with questions or concerns. Yes

## 2017-11-24 ENCOUNTER — Telehealth: Payer: Self-pay | Admitting: *Deleted

## 2017-11-24 ENCOUNTER — Other Ambulatory Visit: Payer: Self-pay

## 2017-11-24 ENCOUNTER — Telehealth: Payer: Self-pay | Admitting: Internal Medicine

## 2017-11-24 NOTE — Telephone Encounter (Signed)
Spoke w/ Beth- informed of below. Beth verbalized understanding.

## 2017-11-24 NOTE — Telephone Encounter (Signed)
Copied from Portal [phone]. Topic: Quick Communication - See Telephone Encounter >> Nov 24, 2017  2:56 PM Vernona Rieger wrote: CRM for notification. See Telephone encounter for: 11/24/17.  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 She needs occupational therapy and a home health aid to help her with assistance.   When she was discharged, the hospital told her to discontinue her telmisartan (MICARDIS) 40 MG . She does not have any of this in the home She needs a repeat BMP as well. ( if home health needs to do this , please let her know when you call back )  Call back [phone]

## 2017-11-24 NOTE — Patient Outreach (Signed)
[place] [facility]) Care Management  11/24/2017  [name] [birth year] [id number]   Follow up call to [name] after hospital discharge.  [name] reported that she was unable to complete the assessment with [facility] due to being hospitalized.  BSW encouraged her to call to reschedule this assessment so that a determination can be made about [name].  BSW asked if she needed contact information for [facility] and she reported that she has it.  BSW will follow up again next week to determine if assessment was rescheduled.  Ronn Melena, BSW Social Worker [phone]

## 2017-11-24 NOTE — Telephone Encounter (Signed)
1.  Okay to provide home health orders 2.   No Micardis per discharge summary 3.  She does need BMP, we can do it at the time of office visit for TCM.  If unable to schedule a TCM for whatever reason, let me know

## 2017-11-24 NOTE — Telephone Encounter (Signed)
Please advise 

## 2017-11-24 NOTE — Telephone Encounter (Signed)
Received Vital Sign Alert Report for review; forwarded to provider/SLS 07/16

## 2017-11-25 ENCOUNTER — Telehealth: Payer: Self-pay | Admitting: Internal Medicine

## 2017-11-25 ENCOUNTER — Encounter: Payer: Self-pay | Admitting: Pulmonary Disease

## 2017-11-25 ENCOUNTER — Ambulatory Visit (INDEPENDENT_AMBULATORY_CARE_PROVIDER_SITE_OTHER): Payer: Medicare Other | Admitting: Pulmonary Disease

## 2017-11-25 ENCOUNTER — Telehealth: Payer: Self-pay | Admitting: *Deleted

## 2017-11-25 VITALS — BP 118/76 | HR 110 | Ht 69.0 in | Wt 253.8 lb

## 2017-11-25 DIAGNOSIS — J9611 Chronic respiratory failure with hypoxia: Secondary | ICD-10-CM

## 2017-11-25 DIAGNOSIS — M1990 Unspecified osteoarthritis, unspecified site: Secondary | ICD-10-CM | POA: Diagnosis not present

## 2017-11-25 DIAGNOSIS — I3139 Other pericardial effusion (noninflammatory): Secondary | ICD-10-CM

## 2017-11-25 DIAGNOSIS — J45909 Unspecified asthma, uncomplicated: Secondary | ICD-10-CM | POA: Diagnosis not present

## 2017-11-25 DIAGNOSIS — M6281 Muscle weakness (generalized): Secondary | ICD-10-CM | POA: Diagnosis not present

## 2017-11-25 DIAGNOSIS — R531 Weakness: Secondary | ICD-10-CM

## 2017-11-25 DIAGNOSIS — I10 Essential (primary) hypertension: Secondary | ICD-10-CM | POA: Diagnosis not present

## 2017-11-25 DIAGNOSIS — J9601 Acute respiratory failure with hypoxia: Secondary | ICD-10-CM

## 2017-11-25 DIAGNOSIS — Z7951 Long term (current) use of inhaled steroids: Secondary | ICD-10-CM | POA: Diagnosis not present

## 2017-11-25 DIAGNOSIS — J449 Chronic obstructive pulmonary disease, unspecified: Secondary | ICD-10-CM | POA: Diagnosis not present

## 2017-11-25 DIAGNOSIS — J849 Interstitial pulmonary disease, unspecified: Secondary | ICD-10-CM

## 2017-11-25 DIAGNOSIS — Z9981 Dependence on supplemental oxygen: Secondary | ICD-10-CM | POA: Diagnosis not present

## 2017-11-25 DIAGNOSIS — I313 Pericardial effusion (noninflammatory): Secondary | ICD-10-CM

## 2017-11-25 DIAGNOSIS — R768 Other specified abnormal immunological findings in serum: Secondary | ICD-10-CM

## 2017-11-25 DIAGNOSIS — J9621 Acute and chronic respiratory failure with hypoxia: Secondary | ICD-10-CM | POA: Diagnosis not present

## 2017-11-25 DIAGNOSIS — E1149 Type 2 diabetes mellitus with other diabetic neurological complication: Secondary | ICD-10-CM | POA: Diagnosis not present

## 2017-11-25 DIAGNOSIS — Z7982 Long term (current) use of aspirin: Secondary | ICD-10-CM | POA: Diagnosis not present

## 2017-11-25 DIAGNOSIS — R0902 Hypoxemia: Secondary | ICD-10-CM

## 2017-11-25 DIAGNOSIS — R000 Tachycardia, unspecified: Secondary | ICD-10-CM | POA: Diagnosis not present

## 2017-11-25 DIAGNOSIS — Z794 Long term (current) use of insulin: Secondary | ICD-10-CM | POA: Diagnosis not present

## 2017-11-25 DIAGNOSIS — R262 Difficulty in walking, not elsewhere classified: Secondary | ICD-10-CM | POA: Diagnosis not present

## 2017-11-25 MED ORDER — COLCHICINE 0.6 MG PO TABS: 0.6000 mg | ORAL_TABLET | Freq: Every day | ORAL | 0 refills | Status: DC

## 2017-11-25 NOTE — Telephone Encounter (Signed)
That is okay.

## 2017-11-25 NOTE — Patient Instructions (Signed)
Refill on colchicine for 1 month.  Referral to rheumatology for pericarditis/interstitial lung disease and positive ANA.  High-resolution CT scan of the chest without contrast in 1 month.  Referral to pulmonary rehab program

## 2017-11-25 NOTE — Assessment & Plan Note (Signed)
Referral to rheumatology for pericarditis/interstitial lung disease and positive ANA.-Consideration here seems to be drug-induced lupus or other collagen vascular disease-this would also be a unifying diagnosis to explain pericarditis  High-resolution CT scan of the chest without contrast in 1 month.  Referral to pulmonary rehab program

## 2017-11-25 NOTE — Assessment & Plan Note (Signed)
We will ask DME/[facility] to review use of oxygen concentrator at home with her Asked her to use portable oxygen only when she is outside

## 2017-11-25 NOTE — Telephone Encounter (Signed)
Copied from [place] [id number]. Topic: Quick Communication - See Telephone Encounter >> Nov 25, 2017  3:57 PM Ivar Drape wrote: CRM for notification. See Telephone encounter for: 11/25/17. Monique w/[facility] needs a prescription faxed to [facility] for a manual wheelchair.  Fax: [phone]

## 2017-11-25 NOTE — Progress Notes (Signed)
Subjective:    Patient ID: [name], female    DOB: December 02, 1950, 67 y.o.   MRN: [id number]  HPI  Chief Complaint  Patient presents with  . Hospitalization Follow-up    patient was seen in the hospital for SOB and fluid around the lung.    67 year old woman with severe OSA and persistent asthma She is now referred from the hospital for evaluation of bilateral pulmonary infiltrates and hypoxia  She has been maintained on 3 L oxygen since April 2019.  Prior to that she was supplied with an oxygen concentrator about 2 years ago but  was only using nocturnal She was found to have pericarditis which was treated with colchicine, cardiac cath was negative Multiple hospital admission course and discharges were reviewed including her imaging and testing  10/2017 hospital admission for dyspnea, CT showed diffuse infiltrates  positive ANA, positive chromatin antibody, and positive RNP    She was seen as pulmonary consultation during hospitalization in July 2019, possibility of drug induced lupus was considered and repeat CT chest was suggested in 8 weeks Barium Swallow was ordered which showed no stricture  She was admitted 11/2017 for tachycardia and hypertension, chest pain with elevated troponin, VQ scan was negative for pulmonary embolism dyspnea.  She never She arrives today accompanied by her son,In a wheelchair, reports understood how to use her oxygen concentrator at home so has been using portable oxygen all the time. She has had a visiting nurse checking on her and home physical therapy is planned  Denies skin rash, significant arthritis or photosensitivity   Significant tests/ events reviewed  PSG 07/11/08 >> AHI 88, SpO2 low 62%  11/2017 esophagram >> Nonspecific esophageal motility disorder. No stricture.  12/2011 CT chest shows bibasilar atelectasis CT angiogram 10/2017 bilateral predominantly lower lobe infiltrates without effusions , cardiomegaly with moderate  pericardial effusion   Past Medical History:  Diagnosis Date  . Anterolisthesis    Grade 1, L4-5  . Bronchospasm 05/28/2012  . CHEST PAIN 11/18/2007  . DEGENERATIVE JOINT DISEASE 10/06/2006  . DEPRESSION 09/26/2008  . DIABETES MELLITUS 10/06/2006  . Diverticulosis    8527,7824  . GERD (gastroesophageal reflux disease) 07/25/2013  . HYPERLIPIDEMIA 01/11/2009  . HYPERTENSION 10/06/2006  . INSOMNIA 09/26/2008  . Internal hemorrhoids   . OBSTRUCTIVE SLEEP APNEA 06/23/2008   Severe OSA per sleep study 2010, Rx a CPAP  . Pain in joint, multiple sites 11/10/2006  . UNSPECIFIED ANEMIA 12/10/2009  . UTI (urinary tract infection) 11/2017     Past Surgical History:  Procedure Laterality Date  . COLONOSCOPY  08/01/2011   Procedure: COLONOSCOPY;  Surgeon: Inda Castle, MD;  Location: [facility];  Service: Endoscopy;  Laterality: N/A;  . LEFT HEART CATH AND CORONARY ANGIOGRAPHY N/A 10/22/2017   Procedure: LEFT HEART CATH AND CORONARY ANGIOGRAPHY;  Surgeon: Jettie Booze, MD;  Location: [facility];  Service: Cardiovascular;  Laterality: N/A;  . 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 Right knee replacement  2005     No Known Allergies   Social History   Socioeconomic History  . Marital status: Widowed    Spouse name: Not on file  . Number of children: 4  . Years of education: Not on file  . Highest education level: Not on file  Occupational History  . Occupation: disability    Employer: UNEMPLOYED  Social Needs  . Financial resource strain: Not on file  . Food insecurity:    Worry: Not on file  Inability: Not on file  . Transportation needs:    Medical: Not on file    Non-medical: Not on file  Tobacco Use  . Smoking status: Former Smoker    Packs/day: 0.20    Years: 4.00    Pack years: 0.80    Types: Cigarettes    Last attempt to quit: 05/13/1995    Years since quitting: 22.5  . Smokeless tobacco: Never Used  Substance and Sexual Activity  . Alcohol use: Not  Currently  . Drug use: No  . Sexual activity: Not Currently  Lifestyle  . Physical activity:    Days per week: Not on file    Minutes per session: Not on file  . Stress: Not on file  Relationships  . Social connections:    Talks on phone: Not on file    Gets together: Not on file    Attends religious service: Not on file    Active member of club or organization: Not on file    Attends meetings of clubs or organizations: Not on file    Relationship status: Not on file  . Intimate partner violence:    Fear of current or ex partner: Not on file    Emotionally abused: Not on file    Physically abused: Not on file    Forced sexual activity: Not on file  Other Topics Concern  . Not on file  Social History Narrative   Widow , lives by herself   Lost a son, 3 living    Lost husband       Family History  Problem Relation Age of Onset  . Asthma Mother   . Stroke Mother   . Diabetes Other        M, B, S  . Hypertension Sister        M, S,B  . Pancreatic cancer Brother   . Colon cancer Neg Hx   . Prostate cancer Neg Hx   . Breast cancer Neg Hx       Review of Systems  Positive for dyspnea, extreme tiredness, poor appetite  Constitutional: negative for anorexia, fevers and sweats  Eyes: negative for irritation, redness and visual disturbance  Ears, nose, mouth, throat, and face: negative for earaches, epistaxis, nasal congestion and sore throat  Respiratory: negative for sputum and wheezing  Cardiovascular: negative for  orthopnea, palpitations and syncope  Gastrointestinal: negative for abdominal pain, constipation, diarrhea, melena, nausea and vomiting  Genitourinary:negative for dysuria, frequency and hematuria  Hematologic/lymphatic: negative for bleeding, easy bruising and lymphadenopathy  Musculoskeletal:negative for arthralgias, muscle weakness Neurological: negative for coordination problems, gait problems, headaches and weakness  Endocrine: negative for  diabetic symptoms including polydipsia, polyuria and weight loss      Objective:   Physical Exam  Gen. Pleasant, well-nourished, in no distress, anxious affect, inwheelchair ENT - no thrush, no post nasal drip Neck: No JVD, no thyromegaly, no carotid bruits Lungs: no use of accessory muscles, no dullness to percussion, biabsal rales no rhonchi  Cardiovascular: Rhythm regular, heart sounds  normal, no murmurs or gallops, no peripheral edema Abdomen: soft and non-tender, no hepatosplenomegaly, BS normal. Musculoskeletal: No deformities, no cyanosis or clubbing Neuro:  alert, non focal       Assessment & Plan:

## 2017-11-25 NOTE — Telephone Encounter (Signed)
Rx for wheelchair ordered. Corene Cornea at [facility] made aware of order.

## 2017-11-25 NOTE — Telephone Encounter (Signed)
Please advise 

## 2017-11-25 NOTE — Assessment & Plan Note (Signed)
Refill on colchicine for 1 month.

## 2017-11-25 NOTE — Addendum Note (Signed)
Addended by: Jannette Spanner on: 11/25/2017 04:43 PM   Modules accepted: Orders

## 2017-11-25 NOTE — Telephone Encounter (Signed)
Received Home Health Certification and Plan of Care; forwarded to provider/SLS 07/17

## 2017-11-26 NOTE — Telephone Encounter (Signed)
Plan of care signed and faxed to [facility] at [phone]. Forms sent for scanning.

## 2017-11-27 ENCOUNTER — Other Ambulatory Visit: Payer: Self-pay

## 2017-11-27 NOTE — Patient Outreach (Signed)
[place] [facility]) Care Management  11/27/2017  ANAIRIS KNICK Oct 22, 1950 [id number]   67 year old with hsitory of Pericarditis, OSA, asthma, chornic hypoxia respiratory failure, HTN, DM.  Most recent admission to hospital for tachycardia (7/9-7/12).  Prior admission 6/10-6/15 with acute respiratory failure with hypoxia.Home O2.   RNCM called to follow up. No answer. HIPPA compliant message left. Covering RNCM placed unsuccessful call on 7/15, RNCM was unsuccessful today. RNCM will send outreach letter. Home visit previously scheduled for next week.  Plan: send outreach letter. Attend home visit as previously scheduled.  Thea Silversmith, RN, MSN, [place] Coordinator Cell: [phone]

## 2017-11-29 NOTE — Progress Notes (Deleted)
Cardiology Office Note   Date:  11/29/2017   ID:  ORALIA CRIGER, DOB [birth year], MRN [id number]  PCP:  Colon Branch, MD  Cardiologist:  Dr.Turner No chief complaint on file.    History of Present Illness: [name] is a 67 y.o. female who presents for posthospitalization follow-up after she was seen by cardiology in the setting of pericardial effusion.  The patient has a history of type 2 diabetes, hypertension, hyperlipidemia, OSA on CPAP, hypothyroidism, and obesity.  On admission the patient was found to be hypoxic with evidence of pulmonary edema, versus infectious versus inflammatory etiology.  And was found to have a moderate pericardial effusion.  The patient was also found to be hypoglycemic.  Echocardiogram revealed LVEF 55 to 60%, mild concentric hypertrophy.  The patient was noted to have a small pericardial effusion.  This was reviewed by cardiology and was not found to be hemodynamically significant.  The patient was treated for CHF and given IV antibiotics as well as IV Lasix.  Patient did have a cardiac catheterization which revealed normal coronary anatomy normal LVEF and low LVEDP.  Symptoms were likey related to pericarditis.  The patient was recommended for Motrin 600 mg 3 times daily for 2 weeks and colchicine 0.6 mg twice daily for 3 months along with PPI for GI prophylaxis.  The patient was taken off of Lasix and potassium.  Past Medical History:  Diagnosis Date  . Anterolisthesis    Grade 1, L4-5  . Bronchospasm 05/28/2012  . CHEST PAIN 11/18/2007  . DEGENERATIVE JOINT DISEASE 10/06/2006  . DEPRESSION 09/26/2008  . DIABETES MELLITUS 10/06/2006  . Diverticulosis    4098,1191  . GERD (gastroesophageal reflux disease) 07/25/2013  . HYPERLIPIDEMIA 01/11/2009  . HYPERTENSION 10/06/2006  . INSOMNIA 09/26/2008  . Internal hemorrhoids   . OBSTRUCTIVE SLEEP APNEA 06/23/2008   Severe OSA per sleep study 2010, Rx a CPAP  . Pain in joint, multiple sites 11/10/2006  .  UNSPECIFIED ANEMIA 12/10/2009  . UTI (urinary tract infection) 11/2017    Past Surgical History:  Procedure Laterality Date  . COLONOSCOPY  08/01/2011   Procedure: COLONOSCOPY;  Surgeon: Inda Castle, MD;  [facility];  Service: Endoscopy;  Laterality: N/A;  . LEFT HEART CATH AND CORONARY ANGIOGRAPHY N/A 10/22/2017   Procedure: LEFT HEART CATH AND CORONARY ANGIOGRAPHY;  Surgeon: Jettie Booze, MD;  Location: [place] [facility];  Service: Cardiovascular;  Laterality: N/A;  . 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 Right knee replacement  2005     Current Outpatient Medications  Medication Sig Dispense Refill  . ACCU-CHEK AVIVA PLUS test strip TEST BLOD SUGAR TWICE DAILY AND LANCETS TWICE DAILY 200 each 0  . ACCU-CHEK SOFTCLIX LANCETS lancets USE TO CHECK BLOOD SUGAR TWICE A DAY 100 each 0  . acetaminophen (TYLENOL) 325 MG tablet Take 2 tablets (650 mg total) by mouth every 6 (six) hours as needed for mild pain (or Fever >/= 101). 20 tablet 1  . albuterol (PROVENTIL HFA;VENTOLIN HFA) 108 (90 Base) MCG/ACT inhaler Inhale 2 puffs into the lungs every 4 (four) hours as needed for wheezing. 1 Inhaler 6  . aspirin EC 81 MG tablet Take 1 tablet (81 mg total) by mouth daily. With food 30 tablet 5  . BD ULTRA-FINE PEN NEEDLES 29G X 12.7MM MISC USE TWICE A DAY 100 each 0  . budesonide-formoterol (SYMBICORT) 160-4.5 MCG/ACT inhaler Inhale 2 puffs into the lungs 2 (two) times daily. 10.2 g 5  .  colchicine 0.6 MG tablet Take 1 tablet (0.6 mg total) by mouth 2 (two) times daily. 60 tablet 5  . colchicine 0.6 MG tablet Take 1 tablet (0.6 mg total) by mouth daily. 60 tablet 0  . Insulin Lispro Prot & Lispro (HUMALOG MIX 50/50 KWIKPEN) (50-50) 100 UNIT/ML Kwikpen Inject 5 Units into the skin 2 (two) times daily with a meal. And pen needles 2/day 15 mL 11  . latanoprost (XALATAN) 0.005 % ophthalmic solution USE 1 DROP IN BOTH EYES AT BEDTIME 2.5 mL 10  . levothyroxine (SYNTHROID, LEVOTHROID) 75  MCG tablet Take 1 tablet (75 mcg total) by mouth daily before breakfast. 30 tablet 5  . metoprolol tartrate (LOPRESSOR) 25 MG tablet Take 1 tablet (25 mg total) by mouth 2 (two) times daily. 60 tablet 5  . OXYGEN Inhale 3 L into the lungs continuous.     . pantoprazole (PROTONIX) 40 MG tablet Take 1 tablet (40 mg total) by mouth daily before breakfast. 90 tablet 0  . potassium chloride (K-DUR,KLOR-CON) 10 MEQ tablet Take 1 tablet (10 mEq total) by mouth daily. 30 tablet 5  . rosuvastatin (CRESTOR) 40 MG tablet Take 1 tablet (40 mg total) by mouth daily. 30 tablet 5   No current facility-administered medications for this visit.     Allergies:   Patient has no known allergies.    Social History:  The patient  reports that she quit smoking about 22 years ago. Her smoking use included cigarettes. She has a 0.80 pack-year smoking history. She has never used smokeless tobacco. She reports that she drank alcohol. She reports that she does not use drugs.   Family History:  The patient's family history includes Asthma in her mother; Diabetes in her other; Hypertension in her sister; Pancreatic cancer in her brother; Stroke in her mother.    ROS: All other systems are reviewed and negative. Unless otherwise mentioned in H&P    PHYSICAL EXAM: VS:  There were no vitals taken for this visit. , BMI There is no height or weight on file to calculate BMI. GEN: Well nourished, well developed, in no acute distress  HEENT: normal  Neck: no JVD, carotid bruits, or masses Cardiac: ***RRR; no murmurs, rubs, or gallops,no edema  Respiratory:  clear to auscultation bilaterally, normal work of breathing GI: soft, nontender, nondistended, + BS MS: no deformity or atrophy  Skin: warm and dry, no rash Neuro:  Strength and sensation are intact Psych: euthymic mood, full affect   EKG:  EKG {ACTION; IS/IS TKP:[id number]} ordered today. The ekg ordered today demonstrates ***   Recent Labs: 10/20/2017: Magnesium  1.9 11/17/2017: B Natriuretic Peptide 149.3 11/19/2017: TSH 6.755 11/20/2017: ALT 39; BUN 20; Creatinine, Ser 1.64; Hemoglobin 11.0; Platelets 300; Potassium 3.8; Sodium 142    Lipid Panel    Component Value Date/Time   CHOL 94 11/18/2017 0012   TRIG 110 11/18/2017 0012   HDL 30 (L) 11/18/2017 0012   CHOLHDL 3.1 11/18/2017 0012   VLDL 22 11/18/2017 0012   LDLCALC 42 11/18/2017 0012      Wt Readings from Last 3 Encounters:  11/25/17 253 lb 12.8 oz (115.1 kg)  11/18/17 255 lb 15.3 oz (116.1 kg)  11/12/17 261 lb 0.4 oz (118.4 kg)      Other studies Reviewed: Additional studies/ records that were reviewed today include: ***. Review of the above records demonstrates: ***   ASSESSMENT AND PLAN:  1.  ***   Current medicines are reviewed at length with the patient  today.    Labs/ tests ordered today include: *** Phill Myron. [place] Pugh, ANP, AACC   11/29/2017 7:57 PM     [facility] [address], [place],  [address] Phone: [phone]; Fax: [phone]

## 2017-11-30 ENCOUNTER — Ambulatory Visit: Payer: Medicare Other | Admitting: Adult Health

## 2017-11-30 ENCOUNTER — Ambulatory Visit: Payer: Self-pay

## 2017-11-30 ENCOUNTER — Telehealth: Payer: Self-pay | Admitting: Internal Medicine

## 2017-11-30 ENCOUNTER — Other Ambulatory Visit: Payer: Self-pay

## 2017-11-30 DIAGNOSIS — J9621 Acute and chronic respiratory failure with hypoxia: Secondary | ICD-10-CM | POA: Diagnosis not present

## 2017-11-30 DIAGNOSIS — J449 Chronic obstructive pulmonary disease, unspecified: Secondary | ICD-10-CM | POA: Diagnosis not present

## 2017-11-30 DIAGNOSIS — Z794 Long term (current) use of insulin: Secondary | ICD-10-CM | POA: Diagnosis not present

## 2017-11-30 DIAGNOSIS — Z9981 Dependence on supplemental oxygen: Secondary | ICD-10-CM | POA: Diagnosis not present

## 2017-11-30 DIAGNOSIS — M1990 Unspecified osteoarthritis, unspecified site: Secondary | ICD-10-CM | POA: Diagnosis not present

## 2017-11-30 DIAGNOSIS — I10 Essential (primary) hypertension: Secondary | ICD-10-CM | POA: Diagnosis not present

## 2017-11-30 DIAGNOSIS — Z7951 Long term (current) use of inhaled steroids: Secondary | ICD-10-CM | POA: Diagnosis not present

## 2017-11-30 DIAGNOSIS — E1149 Type 2 diabetes mellitus with other diabetic neurological complication: Secondary | ICD-10-CM | POA: Diagnosis not present

## 2017-11-30 DIAGNOSIS — R000 Tachycardia, unspecified: Secondary | ICD-10-CM | POA: Diagnosis not present

## 2017-11-30 DIAGNOSIS — R262 Difficulty in walking, not elsewhere classified: Secondary | ICD-10-CM | POA: Diagnosis not present

## 2017-11-30 DIAGNOSIS — J45909 Unspecified asthma, uncomplicated: Secondary | ICD-10-CM | POA: Diagnosis not present

## 2017-11-30 DIAGNOSIS — M6281 Muscle weakness (generalized): Secondary | ICD-10-CM | POA: Diagnosis not present

## 2017-11-30 DIAGNOSIS — Z7982 Long term (current) use of aspirin: Secondary | ICD-10-CM | POA: Diagnosis not present

## 2017-11-30 NOTE — Patient Outreach (Signed)
[facility] [facility]) Care Management  11/30/2017  STEHANIE EKSTROM 30-Oct-1950 [id number]   BSW called [facility] to follow up on request for [place].  Per Sonia Baller with customer service, the assessment was completed on 11/24/17 and they are just waiting for confirmation from the selected agency.   BSW attempted to contact Ms. Gettinger to follow up but had to leave a voicemail message.  BSW will follow up again within four business days.  Unsuccessful outreach letter was mailed on 11/27/17 by RNCM, Thea Silversmith.  Ronn Melena, BSW Social Worker [phone]

## 2017-11-30 NOTE — Telephone Encounter (Signed)
Make sure she is compliant with meds. If still elevated over next day or 2, should have appt. If she is having chest pain or shortness of breath, ER. TY.

## 2017-11-30 NOTE — Telephone Encounter (Signed)
Please advise in PCP absence.  

## 2017-11-30 NOTE — Telephone Encounter (Signed)
Copied from [place] [phone]. Topic: Quick Communication - See Telephone Encounter >> Nov 30, 2017  1:18 PM Valla Leaver wrote: CRM for notification. See Telephone encounter for: 11/30/17. Monique, PT, with [facility] calling to report patient blood pressure today was 170/100.

## 2017-12-01 ENCOUNTER — Other Ambulatory Visit: Payer: Self-pay

## 2017-12-01 ENCOUNTER — Telehealth: Payer: Self-pay | Admitting: Cardiology

## 2017-12-01 ENCOUNTER — Encounter: Payer: Self-pay | Admitting: *Deleted

## 2017-12-01 ENCOUNTER — Telehealth: Payer: Self-pay | Admitting: *Deleted

## 2017-12-01 DIAGNOSIS — R000 Tachycardia, unspecified: Secondary | ICD-10-CM | POA: Diagnosis not present

## 2017-12-01 DIAGNOSIS — Z794 Long term (current) use of insulin: Secondary | ICD-10-CM | POA: Diagnosis not present

## 2017-12-01 DIAGNOSIS — M1990 Unspecified osteoarthritis, unspecified site: Secondary | ICD-10-CM | POA: Diagnosis not present

## 2017-12-01 DIAGNOSIS — J45909 Unspecified asthma, uncomplicated: Secondary | ICD-10-CM | POA: Diagnosis not present

## 2017-12-01 DIAGNOSIS — J449 Chronic obstructive pulmonary disease, unspecified: Secondary | ICD-10-CM | POA: Diagnosis not present

## 2017-12-01 DIAGNOSIS — Z7982 Long term (current) use of aspirin: Secondary | ICD-10-CM | POA: Diagnosis not present

## 2017-12-01 DIAGNOSIS — R262 Difficulty in walking, not elsewhere classified: Secondary | ICD-10-CM | POA: Diagnosis not present

## 2017-12-01 DIAGNOSIS — M6281 Muscle weakness (generalized): Secondary | ICD-10-CM | POA: Diagnosis not present

## 2017-12-01 DIAGNOSIS — Z7951 Long term (current) use of inhaled steroids: Secondary | ICD-10-CM | POA: Diagnosis not present

## 2017-12-01 DIAGNOSIS — I10 Essential (primary) hypertension: Secondary | ICD-10-CM | POA: Diagnosis not present

## 2017-12-01 DIAGNOSIS — E1149 Type 2 diabetes mellitus with other diabetic neurological complication: Secondary | ICD-10-CM | POA: Diagnosis not present

## 2017-12-01 DIAGNOSIS — J9621 Acute and chronic respiratory failure with hypoxia: Secondary | ICD-10-CM | POA: Diagnosis not present

## 2017-12-01 DIAGNOSIS — Z9981 Dependence on supplemental oxygen: Secondary | ICD-10-CM | POA: Diagnosis not present

## 2017-12-01 NOTE — Telephone Encounter (Signed)
Spoke w/ Beckie Busing- recommendations given.

## 2017-12-01 NOTE — Telephone Encounter (Signed)
New message    [phone] calling to report patient missed appt on 7/22 due to lack of transportation.     STAT if HR is under 50 or over 120 (normal HR is 60-100 beats per minute)  1) What is your heart rate? 111  2) Do you have a log of your heart rate readings (document readings)? 111-->115  3) Do you have any other symptoms? SOB wears 3L o2

## 2017-12-01 NOTE — Telephone Encounter (Signed)
Spoke with [place] - pt's SOB is no worse than previous but when she went to do home visit today pt was not wearing her 02.  Her 02 sat was 78-79%.  She instructed her on the importance of wearing the 02.  Pt's HR was 111 and she has not been taking Metoprolol tartrate.  Denton Brick reports pt did not know she didn't have the medication.  She had been taking it while in the hospital and an RX was sent in to her pharmacy 11/20/17 for metoprolol tartrate 25 mg  #60 X 5 upon discharge.  Pt has not had this filled.  Juana aware rx at [facility].   Rescheduled s/p f/u appt for 8/9 at 3 pm at the [facility] office.  Pt is aware of appt date, time and location.  Pt refused early appt d/t transportation issues.

## 2017-12-01 NOTE — Patient Outreach (Signed)
[name]) Care Management   12/01/2017  [name] [birth year] [id number]  [name] is an 67 y.o. female  Subjective: "I feel better".  Objective:  BP (!) 142/98   Pulse (!) 111   Resp (!) 28   Ht 1.753 m (5' 9")   Wt 253 lb (114.8 kg)   SpO2 97% Comment: 97% 3l concentrator; 79% RA HR 118; 93% per portable HR 115  BMI 37.36 kg/m   Review of Systems  Respiratory:       Faint inspiratory crackle noted lower lobe.  Cardiovascular:       Tachycardia, regular.    Physical Exam skin warm, dry, color within normal limits.  Encounter Medications:   Outpatient Encounter Medications as of 12/01/2017  Medication Sig  . ACCU-CHEK AVIVA PLUS test strip TEST BLOD SUGAR TWICE DAILY AND LANCETS TWICE DAILY  . ACCU-CHEK SOFTCLIX LANCETS lancets USE TO CHECK BLOOD SUGAR TWICE A DAY  . acetaminophen (TYLENOL) 325 MG tablet Take 2 tablets (650 mg total) by mouth every 6 (six) hours as needed for mild pain (or Fever >/= 101).  Marland Kitchen albuterol (PROVENTIL HFA;VENTOLIN HFA) 108 (90 Base) MCG/ACT inhaler Inhale 2 puffs into the lungs every 4 (four) hours as needed for wheezing.  Marland Kitchen aspirin EC 81 MG tablet Take 1 tablet (81 mg total) by mouth daily. With food  . BD ULTRA-FINE PEN NEEDLES 29G X 12.7MM MISC USE TWICE A DAY  . budesonide-formoterol (SYMBICORT) 160-4.5 MCG/ACT inhaler Inhale 2 puffs into the lungs 2 (two) times daily.  . colchicine 0.6 MG tablet Take 1 tablet (0.6 mg total) by mouth 2 (two) times daily.  . colchicine 0.6 MG tablet Take 1 tablet (0.6 mg total) by mouth daily.  . Insulin Lispro Prot & Lispro (HUMALOG MIX 50/50 KWIKPEN) (50-50) 100 UNIT/ML Kwikpen Inject 5 Units into the skin 2 (two) times daily with a meal. And pen needles 2/day  . latanoprost (XALATAN) 0.005 % ophthalmic solution USE 1 DROP IN BOTH EYES AT BEDTIME  . levothyroxine (SYNTHROID, LEVOTHROID) 75 MCG tablet Take 1 tablet (75 mcg total) by mouth daily before breakfast.  .  metoprolol tartrate (LOPRESSOR) 25 MG tablet Take 1 tablet (25 mg total) by mouth 2 (two) times daily.  . OXYGEN Inhale 3 L into the lungs continuous.   . pantoprazole (PROTONIX) 40 MG tablet Take 1 tablet (40 mg total) by mouth daily before breakfast.  . potassium chloride (K-DUR,KLOR-CON) 10 MEQ tablet Take 1 tablet (10 mEq total) by mouth daily.  . rosuvastatin (CRESTOR) 40 MG tablet Take 1 tablet (40 mg total) by mouth daily.   No facility-administered encounter medications on file as of 12/01/2017.     Functional Status:   In your present state of health, do you have any difficulty performing the following activities: 11/18/2017 11/12/2017  Hearing? N N  Vision? N N  Difficulty concentrating or making decisions? N N  Walking or climbing stairs? Y Y  Comment - -  Dressing or bathing? N Y  Doing errands, shopping? Y Y  Comment - Son help patient  Preparing Food and eating ? - -  Using the Toilet? - -  In the past six months, have you accidently leaked urine? - -  Do you have problems with loss of bowel control? - -  Managing your Medications? - -  Managing your Finances? - -  Housekeeping or managing your Housekeeping? - -  Comment - -  Some recent data might be  hidden    Fall/Depression Screening:    Fall Risk  10/26/2017 08/06/2017 03/31/2017  Falls in the past year? No Yes Yes  Number falls in past yr: - 1 1  Injury with Fall? - No No  Comment - - -  Risk for fall due to : - History of fall(s) -  Risk for fall due to: Comment - patient reports that she fell once last summer when she slipped during mopping her floor; no loss of consiousness and no injury -  Follow up - Falls prevention discussed;Education provided Education provided;Falls prevention discussed   PHQ 2/9 Scores 10/26/2017 08/06/2017 03/31/2017 03/26/2016 10/31/2015 02/22/2015 10/26/2013  PHQ - 2 Score _0 0 0 0 6  PHQ- 9 Score 13 - 5 - - - -  Exception Documentation - - - - - Other- indicate reason in comment  box -  Not completed - - - - - Pt's son passed in June 2016 -    Assessment:  67 year old with hsitory of Pericarditis, OSA, asthma, chornic hypoxia respiratory failure, HTN, DM.  Most recent admission to [name] for tachycardia (7/9-7/12). Prior admission 6/10-6/15 with acute respiratory failure with hypoxia.Home O2. Client's bath aid from [place] arrived during RNCM's visit.  RNCM completed home visit. Client reports she is feeling better. Upon arrival, client was sitting in chair on room air. RNCM checked oxygen saturation and it was 78% HR 118, then client put on her light weight portable oxygen tank and her saturation increased to 93 percent as client took in breaths. RNCM encouraged client to use continuous oxygen as prescribed and reinforced that the portable oxygen tank provided oxygen only when she breaths in. Client placed on the cannula to concentrator per client set on 3Liters and saturation increased to 97% HR 111.   Medications reviewed: Discrepancies noted:  1) client does not have metoprolol. She did not realize that she was missing this medication. RNCM called her pharmacy and they state the last time this medication was filled was on 10/25/17 and that she has no refills. RNCM reinforced with client that this medication is used to control heart rate.  2) client has been taking levothyroxine 112 mcg daily, but what is listed in chart is 75 mcg daily.  Client missed her cardiology appointment that was scheduled for yesterday. [name] states that her son was not able to take her on yesterday. RNCM encouraged client to attend all scheduled follow up appointments and encouraged her to call RNCM if transportation needed.  Client reports pains in my "stomach" and points to abdominal area adding "feels like nails sticking across me. It has been going on a while, but states it is better than it was" and the doctor has given her medication for it.  RNCM called cardiologist office  and left a message regarding metoprolol and request to reschedule appointment.   RNCM received return call from cardiologist nurse: she reports that she spoke with client and Ms. Staubs has a follow up appointment scheduled for August 8th. Also client did not want a sooner appointment because the times available were earlier in the morning. She also reports per chart review, client was given a hand prescription from Dr. Roxan Hockey for metoprolol along with other medications and that cardiology office is unable to send in a prescription because client has not been seen. RNCM will complete [name] pharmacy referral for medication reconciliation.   Plan: home visit next month.  [facility] CM Care Plan Problem One  Most Recent Value  Care Plan Problem One  Care Coordination and Education Needs re: Respiratory Failure management  Role Documenting the Problem One  Care Management Coordinator  Care Plan for Problem One  Active  THN Long Term Goal   Over the next 60 days, patient will verbalize receipt of coordination services and will verbalize undertanding of plan of care for management of respiratory failure  THN Long Term Goal Start Date  10/26/17  Interventions for Problem One Long Term Goal  RNCM encouraged client to follow up with all providers as scheduled, confirmed she has an appointment with a nurse practioner and is in the process of obtaining personal care services through [employer].   THN CM Short Term Goal #1   Over the next 30 days, patient will attend all scheduled provider and diagnostic appointments  THN CM Short Term Goal #1 Start Date  10/26/17  Interventions for Short Term Goal #1  continue to encourage client to attend all scheduled appointments, RNCM reviewed upcoming appointments scheduled in the system.  THN CM Short Term Goal #2   Over the next 30 days, patient will report any new or worsened symptoms to PCP as soon as noted  THN CM Short Term Goal #2 Start Date  10/26/17   Interventions for Short Term Goal #2  RNCM encouraged client to follow up with providers as needed.  THN CM Short Term Goal #3  client will reschedule cardiologist appointment within the next 30 days.  THN CM Short Term Goal #3 Start Date  12/01/17  Interventions for Short Tern Goal #3  RNCM called provider office regarding the need to reschedule client's appointment.    THN CM Care Plan Problem Two     Most Recent Value  Care Plan Problem Two  alteration in comfort  Care Plan for Problem Two  Not Active  THN Long Term Goal  N/A  THN CM Short Term Goal #1   client will report improvement of pain within the next 30 days.  THN CM Short Term Goal #1 Start Date  11/03/17  [facility] CM Short Term Goal #1 Met Date   12/01/17  THN CM Short Term Goal #2   client will take medications as prescribed within the next 30 days.  THN CM Short Term Goal #2 Start Date  11/03/17  [facility] CM Short Term Goal #2 Met Date  12/01/17    [facility] CM Care Plan Problem Three     Most Recent Value  Care Plan Problem Three  Knowledge Deficits re: long term plan of care for management of DM  Role Documenting the Problem Three  Care Management Coordinator  Care Plan for Problem Three  Active  THN Long Term Goal   Over the next 60 days, patient will verbalize understanding of long term plan of care for mangaement of DM  THN Long Term Goal Start Date  10/26/17  Interventions for Problem Three Long Term Goal  provided positive feedback that client has been checking blood sugars and recording them.   THN CM Short Term Goal #1   Over the next 30 days, patient will continue to check and record cbg's daily.   THN CM Short Term Goal #1 Start Date  10/26/17  THN CM Short Term Goal #1 Met Date  12/01/17  THN CM Short Term Goal #2   Over the next 30 days, patient will verbalize understanding of signs and symptoms of hypoglycemia  THN CM Short Term Goal #2 Start Date  10/26/17  Interventions for Short Term Goal #2  continue at next visit.      Thea Silversmith, RN, MSN, [place] Coordinator Cell: [phone]

## 2017-12-01 NOTE — Telephone Encounter (Signed)
Received Physician Orders [x2-Resumption of Care and Add On Discipline] from [facility], forwarded to provider/SLS 07/23

## 2017-12-03 ENCOUNTER — Telehealth (HOSPITAL_COMMUNITY): Payer: Self-pay

## 2017-12-03 ENCOUNTER — Ambulatory Visit: Payer: Self-pay

## 2017-12-03 ENCOUNTER — Telehealth: Payer: Self-pay | Admitting: Internal Medicine

## 2017-12-03 ENCOUNTER — Emergency Department (HOSPITAL_COMMUNITY)
Admission: EM | Admit: 2017-12-03 | Discharge: 2017-12-04 | Disposition: A | Discharge status: Home / Self Care | Payer: Medicare Other | Attending: Emergency Medicine | Admitting: Emergency Medicine

## 2017-12-03 ENCOUNTER — Other Ambulatory Visit: Payer: Self-pay

## 2017-12-03 ENCOUNTER — Encounter (HOSPITAL_COMMUNITY): Payer: Self-pay | Admitting: Emergency Medicine

## 2017-12-03 DIAGNOSIS — Z7951 Long term (current) use of inhaled steroids: Secondary | ICD-10-CM | POA: Diagnosis not present

## 2017-12-03 DIAGNOSIS — J45909 Unspecified asthma, uncomplicated: Secondary | ICD-10-CM | POA: Insufficient documentation

## 2017-12-03 DIAGNOSIS — Z794 Long term (current) use of insulin: Secondary | ICD-10-CM | POA: Insufficient documentation

## 2017-12-03 DIAGNOSIS — E11649 Type 2 diabetes mellitus with hypoglycemia without coma: Secondary | ICD-10-CM | POA: Insufficient documentation

## 2017-12-03 DIAGNOSIS — I10 Essential (primary) hypertension: Secondary | ICD-10-CM | POA: Insufficient documentation

## 2017-12-03 DIAGNOSIS — Z7982 Long term (current) use of aspirin: Secondary | ICD-10-CM | POA: Diagnosis not present

## 2017-12-03 DIAGNOSIS — R000 Tachycardia, unspecified: Secondary | ICD-10-CM | POA: Diagnosis not present

## 2017-12-03 DIAGNOSIS — R4020 Unspecified coma: Secondary | ICD-10-CM | POA: Diagnosis not present

## 2017-12-03 DIAGNOSIS — M6281 Muscle weakness (generalized): Secondary | ICD-10-CM | POA: Diagnosis not present

## 2017-12-03 DIAGNOSIS — E1149 Type 2 diabetes mellitus with other diabetic neurological complication: Secondary | ICD-10-CM | POA: Diagnosis not present

## 2017-12-03 DIAGNOSIS — Z87891 Personal history of nicotine dependence: Secondary | ICD-10-CM | POA: Diagnosis not present

## 2017-12-03 DIAGNOSIS — E162 Hypoglycemia, unspecified: Secondary | ICD-10-CM | POA: Diagnosis not present

## 2017-12-03 DIAGNOSIS — R262 Difficulty in walking, not elsewhere classified: Secondary | ICD-10-CM | POA: Diagnosis not present

## 2017-12-03 DIAGNOSIS — J9621 Acute and chronic respiratory failure with hypoxia: Secondary | ICD-10-CM | POA: Diagnosis not present

## 2017-12-03 DIAGNOSIS — J449 Chronic obstructive pulmonary disease, unspecified: Secondary | ICD-10-CM | POA: Diagnosis not present

## 2017-12-03 DIAGNOSIS — E161 Other hypoglycemia: Secondary | ICD-10-CM | POA: Diagnosis not present

## 2017-12-03 DIAGNOSIS — Z9981 Dependence on supplemental oxygen: Secondary | ICD-10-CM | POA: Diagnosis not present

## 2017-12-03 DIAGNOSIS — M1990 Unspecified osteoarthritis, unspecified site: Secondary | ICD-10-CM | POA: Diagnosis not present

## 2017-12-03 LAB — COMPREHENSIVE METABOLIC PANEL
ALT: 49 U/L — ABNORMAL HIGH (ref 0–44)
AST: 58 U/L — ABNORMAL HIGH (ref 15–41)
Albumin: 2.9 g/dL — ABNORMAL LOW (ref 3.5–5.0)
Alkaline Phosphatase: 61 U/L (ref 38–126)
Anion gap: 10 (ref 5–15)
BUN: 11 mg/dL (ref 8–23)
CO2: 23 mmol/L (ref 22–32)
Calcium: 8.9 mg/dL (ref 8.9–10.3)
Chloride: 109 mmol/L (ref 98–111)
Creatinine, Ser: 1.49 mg/dL — ABNORMAL HIGH (ref 0.44–1.00)
GFR calc Af Amer: 40 mL/min — ABNORMAL LOW (ref >60)
GFR calc non Af Amer: 35 mL/min — ABNORMAL LOW (ref >60)
Glucose, Bld: 29 mg/dL — CL (ref 70–99)
Potassium: 2.9 mmol/L — ABNORMAL LOW (ref 3.5–5.1)
Sodium: 142 mmol/L (ref 135–145)
Total Bilirubin: 0.7 mg/dL (ref 0.3–1.2)
Total Protein: 7.8 g/dL (ref 6.5–8.1)

## 2017-12-03 LAB — I-STAT TROPONIN, ED: Troponin i, poc: 0.1 ng/mL (ref 0.00–0.08)

## 2017-12-03 LAB — CBC
HCT: 35.6 % — ABNORMAL LOW (ref 36.0–46.0)
Hemoglobin: 11 g/dL — ABNORMAL LOW (ref 12.0–15.0)
MCH: 23.6 pg — ABNORMAL LOW (ref 26.0–34.0)
MCHC: 30.9 g/dL (ref 30.0–36.0)
MCV: 76.2 fL — ABNORMAL LOW (ref 78.0–100.0)
Platelets: 325 10*3/uL (ref 150–400)
RBC: 4.67 MIL/uL (ref 3.87–5.11)
RDW: 21.6 % — ABNORMAL HIGH (ref 11.5–15.5)
WBC: 5.9 10*3/uL (ref 4.0–10.5)

## 2017-12-03 LAB — CBG MONITORING, ED
Glucose-Capillary: 20 mg/dL — CL (ref 70–99)
Glucose-Capillary: 215 mg/dL — ABNORMAL HIGH (ref 70–99)

## 2017-12-03 MED ORDER — POTASSIUM CHLORIDE CRYS ER 20 MEQ PO TBCR
40.0000 meq | EXTENDED_RELEASE_TABLET | Freq: Once | ORAL | Status: AC
  Administered 2017-12-03: 40 meq via ORAL
  Filled 2017-12-03: qty 2

## 2017-12-03 MED ORDER — DEXTROSE 50 % IV SOLN
50.0000 mL | Freq: Once | INTRAVENOUS | Status: AC
  Administered 2017-12-03: 50 mL via INTRAVENOUS
  Filled 2017-12-03: qty 50

## 2017-12-03 MED ORDER — DEXTROSE 50 % IV SOLN
INTRAVENOUS | Status: AC
  Filled 2017-12-03: qty 50

## 2017-12-03 MED ORDER — MAGNESIUM OXIDE 400 (241.3 MG) MG PO TABS
800.0000 mg | ORAL_TABLET | Freq: Once | ORAL | Status: AC
  Administered 2017-12-04: 800 mg via ORAL
  Filled 2017-12-03: qty 2

## 2017-12-03 MED ORDER — POTASSIUM CHLORIDE 20 MEQ/15ML (10%) PO SOLN
10.0000 meq | Freq: Once | ORAL | Status: AC
  Administered 2017-12-03: 10 meq via ORAL
  Filled 2017-12-03: qty 15

## 2017-12-03 NOTE — Telephone Encounter (Signed)
Patients insurance is active and benefits verified through [facility] - No co-pay, no deductible, out of pocket amount of $6,700/$0.00 has been met, no co-insurance, and no pre-authorization is required. Patient is covered at 100%. Spoke with [employer] with [employer]. Reference [phone]

## 2017-12-03 NOTE — ED Provider Notes (Signed)
[facility] EMERGENCY DEPARTMENT Provider Note   CSN: [id number] Arrival date & time: 12/03/17  2145     History   Chief Complaint Chief Complaint  Patient presents with  . Altered Mental Status  . Hypoglycemia    HPI [name] is a 67 y.o. female.  HPI   [name] is a 67 year old female with a history of chronic respiratory failure with hypoxia (on 3 L home oxygen), interstitial lung disease, insulin-dependent type 2 diabetes, hypertension, hyperlipidemia who presents to the emergency department via EMS for evaluation of feeling unwell.  Patient states that she took her Humalog 50/50 prior to dinner, ate a salad and shortly after felt sick.  Does not report any specific symptoms, just states that she did not feel well.  When she arrived to the ED her CBG was 20. Patient received 2 amps of D50 prior to me seeing her and she now reports complete improvement and feels baseline. She has no complaints. Denies fever, chills, chest pain, sob, cough, wheezing, abdominal pain, nausea/vomiting, diarrhea, dysuria, urinary frequency, numbness, weakness, visual disturbance.   Past Medical History:  Diagnosis Date  . Anterolisthesis    Grade 1, L4-5  . Bronchospasm 05/28/2012  . CHEST PAIN 11/18/2007  . DEGENERATIVE JOINT DISEASE 10/06/2006  . DEPRESSION 09/26/2008  . DIABETES MELLITUS 10/06/2006  . Diverticulosis    4098,1191  . GERD (gastroesophageal reflux disease) 07/25/2013  . HYPERLIPIDEMIA 01/11/2009  . HYPERTENSION 10/06/2006  . INSOMNIA 09/26/2008  . Internal hemorrhoids   . OBSTRUCTIVE SLEEP APNEA 06/23/2008   Severe OSA per sleep study 2010, Rx a CPAP  . Pain in joint, multiple sites 11/10/2006  . UNSPECIFIED ANEMIA 12/10/2009  . UTI (urinary tract infection) 11/2017    Patient Active Problem List   Diagnosis Date Noted  . ILD (interstitial lung disease) ([place]) 11/25/2017  . Acute hypoxemic respiratory failure ([place]) 11/18/2017  . Tachycardia 11/17/2017   . ARF (acute renal failure) ([place]) 11/17/2017  . Elevated troponin 11/17/2017  . Chronic respiratory failure with hypoxia ([place]) 11/12/2017  . Shortness of breath   . Pericardial effusion   . Pain 10/19/2017  . Paresthesia 10/19/2017  . Weakness 10/19/2017  . Neck pain 10/19/2017  . PCP NOTES >>> 02/23/2015  . Nocturnal oxygen desaturation 01/02/2015  . Morbid obesity ([place]) 01/02/2015  . Asthma, chronic 01/02/2015  . GERD (gastroesophageal reflux disease) 07/25/2013  . DOE (dyspnea on exertion) 04/25/2013  . 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 Bronchospasm 05/28/2012  . Internal hemorrhoids without mention of complication 47/82/9562  . Annual physical exam 06/06/2011  . Anemia 12/10/2009  . SKIN LESION 08/06/2009  . Hyperlipidemia 01/11/2009  . Depression 09/26/2008  . INSOMNIA 09/26/2008  . Obstructive sleep apnea 06/23/2008  . Chest pain 11/18/2007  . DM II (diabetes mellitus, type II), controlled ([place]) 10/06/2006  . Essential hypertension 10/06/2006  . Osteoarthritis 10/06/2006    Past Surgical History:  Procedure Laterality Date  . COLONOSCOPY  08/01/2011   Procedure: COLONOSCOPY;  Surgeon: Inda Castle, MD;  Location: [facility] ENDOSCOPY;  Service: Endoscopy;  Laterality: N/A;  . LEFT HEART CATH AND CORONARY ANGIOGRAPHY N/A 10/22/2017   Procedure: LEFT HEART CATH AND CORONARY ANGIOGRAPHY;  Surgeon: Jettie Booze, MD;  Location: [facility];  Service: Cardiovascular;  Laterality: N/A;  . 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 Right knee replacement  2005     OB History   None  Home Medications    Prior to Admission medications   Medication Sig Start Date End Date Taking? Authorizing Provider  acetaminophen (TYLENOL) 325 MG tablet Take 2 tablets (650 mg total) by mouth every 6 (six) hours as needed for mild pain (or Fever >/= 101). 11/20/17  Yes Emokpae, Courage, MD  albuterol (PROVENTIL HFA;VENTOLIN HFA) 108 (90  Base) MCG/ACT inhaler Inhale 2 puffs into the lungs every 4 (four) hours as needed for wheezing. 11/20/17 01/18/29 Yes Emokpae, Courage, MD  aspirin EC 81 MG tablet Take 1 tablet (81 mg total) by mouth daily. With food 11/20/17  Yes Emokpae, Courage, MD  budesonide-formoterol (SYMBICORT) 160-4.5 MCG/ACT inhaler Inhale 2 puffs into the lungs 2 (two) times daily. 11/20/17  Yes Emokpae, Courage, MD  colchicine 0.6 MG tablet Take 1 tablet (0.6 mg total) by mouth 2 (two) times daily. 11/20/17  Yes Emokpae, Courage, MD  Insulin Lispro Prot & Lispro (HUMALOG MIX 50/50 KWIKPEN) (50-50) 100 UNIT/ML Kwikpen Inject 5 Units into the skin 2 (two) times daily with a meal. And pen needles 2/day 11/20/17  Yes Emokpae, Courage, MD  latanoprost (XALATAN) 0.005 % ophthalmic solution USE 1 DROP IN BOTH EYES AT BEDTIME 11/20/17  Yes Emokpae, Courage, MD  levothyroxine (SYNTHROID, LEVOTHROID) 75 MCG tablet Take 1 tablet (75 mcg total) by mouth daily before breakfast. 11/20/17  Yes Emokpae, Courage, MD  OXYGEN Inhale 3 L into the lungs continuous.    Yes [provider]  pantoprazole (PROTONIX) 40 MG tablet Take 1 tablet (40 mg total) by mouth daily before breakfast. 11/02/17  Yes Paz, Alda Berthold, MD  potassium chloride (K-DUR,KLOR-CON) 10 MEQ tablet Take 1 tablet (10 mEq total) by mouth daily. 11/20/17  Yes Emokpae, Courage, MD  rosuvastatin (CRESTOR) 40 MG tablet Take 1 tablet (40 mg total) by mouth daily. Patient taking differently: Take 40 mg by mouth every evening.  11/20/17  Yes Emokpae, Courage, MD  telmisartan (MICARDIS) 40 MG tablet Take 40 mg by mouth daily.   Yes [provider]  ACCU-CHEK AVIVA PLUS test strip TEST BLOD SUGAR TWICE DAILY AND LANCETS TWICE DAILY Patient not taking: Reported on 12/03/2017 10/15/17   Renato Shin, MD  ACCU-CHEK SOFTCLIX LANCETS lancets USE TO CHECK BLOOD SUGAR TWICE A DAY Patient not taking: Reported on 12/03/2017 10/05/17   Renato Shin, MD  BD ULTRA-FINE PEN NEEDLES 29G X 12.7MM  MISC USE TWICE A DAY Patient not taking: Reported on 12/03/2017 10/27/17   Renato Shin, MD  colchicine 0.6 MG tablet Take 1 tablet (0.6 mg total) by mouth daily. Patient not taking: Reported on 12/01/2017 11/25/17   Rigoberto Noel, MD  metoprolol tartrate (LOPRESSOR) 25 MG tablet Take 1 tablet (25 mg total) by mouth 2 (two) times daily. Patient not taking: Reported on 12/03/2017 11/20/17   Roxan Hockey, MD    Family History Family History  Problem Relation Age of Onset  . Asthma Mother   . Stroke Mother   . Diabetes Other        M, B, S  . Hypertension Sister        M, S,B  . Pancreatic cancer Brother   . Colon cancer Neg Hx   . Prostate cancer Neg Hx   . Breast cancer Neg Hx     Social History Social History   Tobacco Use  . Smoking status: Former Smoker    Packs/day: 0.20    Years: 4.00    Pack years: 0.80    Types: Cigarettes  Last attempt to quit: 05/13/1995    Years since quitting: 22.5  . Smokeless tobacco: Never Used  Substance Use Topics  . Alcohol use: Not Currently  . Drug use: No     Allergies   Patient has no known allergies.   Review of Systems Review of Systems  Constitutional: Negative for chills and fever.  HENT: Negative for congestion, sore throat and trouble swallowing.   Eyes: Negative for visual disturbance.  Respiratory: Negative for cough and shortness of breath.   Cardiovascular: Negative for chest pain.  Gastrointestinal: Negative for abdominal pain, nausea and vomiting.  Genitourinary: Negative for difficulty urinating, dysuria and frequency.  Musculoskeletal: Negative for gait problem.  Skin: Negative for color change and wound.  Neurological: Negative for dizziness, speech difficulty, weakness, light-headedness and numbness.     Physical Exam Updated Vital Signs BP (!) 147/93   Pulse 78   Temp 97.6 F (36.4 C) (Oral)   Resp (!) 21   Ht 5\' 9"  (1.753 m)   Wt 114.8 kg (253 lb)   SpO2 99%   BMI 37.36 kg/m   Physical Exam   Constitutional: She is oriented to person, place, and time. She appears well-developed and well-nourished. No distress.  Sitting at bedside in no apparent distress, non-toxic appearing.   HENT:  Head: Normocephalic and atraumatic.  Mouth/Throat: Oropharynx is clear and moist.  Eyes: Pupils are equal, round, and reactive to light. Conjunctivae are normal. Right eye exhibits no discharge. Left eye exhibits no discharge.  Neck: Normal range of motion. Neck supple.  Cardiovascular: Normal rate, regular rhythm and intact distal pulses.  Pulmonary/Chest: Effort normal. No respiratory distress.  No respiratory distress. Speaking in full sentences. Dry crackles in bilateral bases.   Abdominal: Soft. There is no tenderness.  Musculoskeletal: Normal range of motion.  Neurological: She is alert and oriented to person, place, and time. Coordination normal.  Mental Status:  Alert, oriented, thought content appropriate, able to give a coherent history. Speech fluent without evidence of aphasia. Able to follow 2 step commands without difficulty.  Cranial Nerves:  II:  Peripheral visual fields grossly normal, pupils equal, round, reactive to light III,IV, VI: ptosis not present, extra-ocular motions intact bilaterally  V,VII: smile symmetric, facial light touch sensation equal VIII: hearing grossly normal to voice  X: uvula elevates symmetrically  XI: bilateral shoulder shrug symmetric and strong XII: midline tongue extension without fassiculations Motor:  Normal tone. 5/5 in upper and lower extremities bilaterally including strong and equal grip strength and dorsiflexion/plantar flexion Sensory: Sensation to light touch normal in all extremities.  CV: distal pulses palpable throughout   Skin: Skin is warm and dry. She is not diaphoretic.  Psychiatric: She has a normal mood and affect. Her behavior is normal.  Nursing note and vitals reviewed.    ED Treatments / Results  Labs (all labs ordered  are listed, but only abnormal results are displayed) Labs Reviewed  COMPREHENSIVE METABOLIC PANEL - Abnormal; Notable for the following components:      Result Value   Potassium 2.9 (*)    Glucose, Bld 29 (*)    Creatinine, Ser 1.49 (*)    Albumin 2.9 (*)    AST 58 (*)    ALT 49 (*)    GFR calc non Af Amer 35 (*)    GFR calc Af Amer 40 (*)    All other components within normal limits  CBC - Abnormal; Notable for the following components:   Hemoglobin 11.0 (*)  HCT 35.6 (*)    MCV 76.2 (*)    MCH 23.6 (*)    RDW 21.6 (*)    All other components within normal limits  CBG MONITORING, ED - Abnormal; Notable for the following components:   Glucose-Capillary 20 (*)    All other components within normal limits  I-STAT TROPONIN, ED - Abnormal; Notable for the following components:   Troponin i, poc 0.10 (*)    All other components within normal limits  CBG MONITORING, ED - Abnormal; Notable for the following components:   Glucose-Capillary 215 (*)    All other components within normal limits  TROPONIN I    EKG EKG Interpretation  Date/Time:  Thursday December 03 2017 23:40:51 EDT Ventricular Rate:  90 PR Interval:    QRS Duration: 103 QT Interval:  391 QTC Calculation: 479 R Axis:   14 Text Interpretation:  Sinus rhythm Probable left ventricular hypertrophy Confirmed by Virgel Manifold [id number]) on 12/03/2017 11:53:00 PM   Radiology No results found.  Procedures Procedures (including critical care time)  Medications Ordered in ED Medications  dextrose 50 % solution 50 mL (50 mLs Intravenous Given 12/03/17 2211)  potassium chloride SA (K-DUR,KLOR-CON) CR tablet 40 mEq (40 mEq Oral Given 12/03/17 2319)  potassium chloride 20 MEQ/15ML (10%) solution 10 mEq (10 mEq Oral Given 12/03/17 2318)  magnesium oxide (MAG-OX) tablet 800 mg (800 mg Oral Given 12/04/17 0001)     Initial Impression / Assessment and Plan / ED Course  I have reviewed the triage vital signs and the nursing  notes.  Pertinent labs & imaging results that were available during my care of the patient were reviewed by me and considered in my medical decision making (see chart for details).     Presents to the emergency department via EMS feeling generally unwell.  Initial CBG 20.  She received 2amps of D50 and her CBG improved to 215.  Patient reports complete resolution of her symptoms and feels baseline.  Lab work reviewed.  She is hypokalemic with potassium 2.9, likely related to insulin.  Replaced her with p.o. and IV potassium as well as p.o. magnesium.  Creatinine 1.49 which is improved from baseline.  CBC unremarkable, she has a mild microcytic anemia (Hgb 11.0 which is baseline).  Her troponin is elevated at 0.10 and initial EKG with new T wave inversions in lateral and inferior leads.  She denies symptoms.  Per chart review, she has a history of elevated troponin (0.16 documented two weeks ago) and had a coronary catheterization six weeks ago which was completely normal.  Given patient this, doubt ACS.  Discussed this patient with Dr. Wilson Singer who recommends repeat EKG and troponin.   Repeat EKG reassuring, T wave inversions have resolved.  Patient continues to deny chest pain, shortness of breath, nausea/vomiting, diaphoresis and has no complaints.  Plan to repeat troponin after 3 hours.  If no significant change, plan to discharge patient home with instructions to follow-up with her endocrinologist at her scheduled appointment tomorrow for continued diabetic management.  Signout given to [employer] at shift change for disposition following return of troponin and for repeat CBG.  Final Clinical Impressions(s) / ED Diagnoses   Final diagnoses:  None    ED Discharge Orders    None       Bernarda Caffey 12/04/17 0101    Virgel Manifold, MD 12/11/17 1442

## 2017-12-03 NOTE — Patient Outreach (Signed)
[facility]) Care Management  12/03/2017  [name] June 30, 1950 [id number]   Multi-disciplinary case conference completed.   Thea Silversmith, RN, MSN, [place] Coordinator Cell: [phone]

## 2017-12-03 NOTE — Telephone Encounter (Signed)
Copied from [place] [id number]. Topic: General - Other >> Dec 03, 2017  4:43 PM Yvette Rack wrote: Reason for CRM: Neomia Dear with [facility] requests verbal orders for [place] 1 time a week 1 week, 2 times a week for 4 weeks, and 1 time a week for 1 week. Cb# [phone].

## 2017-12-03 NOTE — ED Triage Notes (Signed)
Pt arrives via EMS, c/o feeling "sick" for months. Pt not verbalizing what her complaint is, pt grunting and saying "help me", c/o pain but will not localize pain. BP 160/100, HR 92, CBG 73. Hx HTN, DM

## 2017-12-03 NOTE — Telephone Encounter (Signed)
Copied from [place] [id number]. Topic: Quick Communication - See Telephone Encounter >> Dec 03, 2017  1:26 PM Mylinda Latina, NT wrote: CRM for notification. See Telephone encounter for: 12/03/17. Langley Gauss calling from [facility] states she was doing a visit today and states the patient BP is 150/98 w / no symptoms  She states the patient has been taking the colchicine 0.6 MG tablet. She was prescribed metoprolol tartrate (LOPRESSOR) 25 MG tablet  but has not taken this medication and has not picked up the medication from the pharmacy. She is going to go and pick up the Metoprolol today.  The patient is unsure how long she was without the Metoprolol. Langley Gauss wanted to make the provider aware . Langley Gauss is requesting a call back if the provider would want anything changed. CB# [phone]

## 2017-12-03 NOTE — ED Notes (Signed)
ED Provider at bedside. 

## 2017-12-03 NOTE — ED Notes (Addendum)
Pt given 1 amp D50, Geanie Kenning, PA aware. Order for another amp of D50 given.

## 2017-12-03 NOTE — Telephone Encounter (Signed)
Noted  

## 2017-12-03 NOTE — Telephone Encounter (Signed)
Called patient in regards to Pulmonary Rehab - Patient stated she is interested. Patient stated she is not using a wheel care at this time. She does walk with a cane occasionally and a walker when going to the store. She stated overall she is walking well. She does utilize [employer] and also rides with her son. Patient is currently receiving HHPT and she is unsure how much longer she has until it's completed. Patient stated she is on her 2nd week. Will follow up with patient in a couple of weeks.

## 2017-12-04 ENCOUNTER — Encounter: Payer: Self-pay | Admitting: Endocrinology

## 2017-12-04 ENCOUNTER — Ambulatory Visit (INDEPENDENT_AMBULATORY_CARE_PROVIDER_SITE_OTHER): Payer: Medicare Other | Admitting: Endocrinology

## 2017-12-04 ENCOUNTER — Telehealth: Payer: Self-pay | Admitting: *Deleted

## 2017-12-04 VITALS — BP 124/84 | HR 116 | Ht 69.0 in | Wt 254.0 lb

## 2017-12-04 DIAGNOSIS — N181 Chronic kidney disease, stage 1: Secondary | ICD-10-CM

## 2017-12-04 DIAGNOSIS — E1122 Type 2 diabetes mellitus with diabetic chronic kidney disease: Secondary | ICD-10-CM | POA: Diagnosis not present

## 2017-12-04 DIAGNOSIS — E11649 Type 2 diabetes mellitus with hypoglycemia without coma: Secondary | ICD-10-CM | POA: Diagnosis not present

## 2017-12-04 LAB — TROPONIN I: Troponin I: 0.06 ng/mL (ref <0.03)

## 2017-12-04 LAB — CBG MONITORING, ED
Glucose-Capillary: 86 mg/dL (ref 70–99)
Glucose-Capillary: 112 mg/dL — ABNORMAL HIGH (ref 70–99)

## 2017-12-04 MED ORDER — DEXTROSE 50 % IV SOLN
25.0000 mL | Freq: Once | INTRAVENOUS | Status: AC
  Administered 2017-12-04: 25 mL via INTRAVENOUS
  Filled 2017-12-04: qty 50

## 2017-12-04 MED ORDER — POTASSIUM CHLORIDE ER 20 MEQ PO TBCR: 20.0000 meq | EXTENDED_RELEASE_TABLET | Freq: Every day | ORAL | 0 refills | Status: DC

## 2017-12-04 MED ORDER — SITAGLIPTIN PHOSPHATE 50 MG PO TABS: 50.0000 mg | ORAL_TABLET | Freq: Every day | ORAL | 11 refills | Status: DC

## 2017-12-04 NOTE — ED Notes (Signed)
PTAR canceled  ?

## 2017-12-04 NOTE — Progress Notes (Signed)
Subjective:    Patient ID: [name], female    DOB: 03/05/1951, 67 y.o.   MRN: [id number]  HPI Pt returns for f/u of diabetes mellitus: DM type: Insulin-requiring type 2 Dx'ed: 2836 Complications: polyneuropathy and renal insufficiency.   Therapy: insulin since 2004.  GDM: never.  DKA: never. Severe hypoglycemia: never.  Pancreatitis: never.  Other: she declines weight-loss surgery; she did not achieve good glycemic control with multiple daily injections, but she has done better with a BID insulin regimen.  She has takes less insulin on Sunday mornings, due to risk hypoglycemia then.   Interval history: She was advised to take 5 units BID at hosp d/c.  However, she has been taking 20 units BID.  She was seen in ER for severe hypoglycemia yesterday.   Past Medical History:  Diagnosis Date  . Anterolisthesis    Grade 1, L4-5  . Bronchospasm 05/28/2012  . CHEST PAIN 11/18/2007  . DEGENERATIVE JOINT DISEASE 10/06/2006  . DEPRESSION 09/26/2008  . DIABETES MELLITUS 10/06/2006  . Diverticulosis    [id number]  . GERD (gastroesophageal reflux disease) 07/25/2013  . HYPERLIPIDEMIA 01/11/2009  . HYPERTENSION 10/06/2006  . INSOMNIA 09/26/2008  . Internal hemorrhoids   . OBSTRUCTIVE SLEEP APNEA 06/23/2008   Severe OSA per sleep study 2010, Rx a CPAP  . Pain in joint, multiple sites 11/10/2006  . UNSPECIFIED ANEMIA 12/10/2009  . UTI (urinary tract infection) 11/2017    Past Surgical History:  Procedure Laterality Date  . COLONOSCOPY  08/01/2011   Procedure: COLONOSCOPY;  Surgeon: Inda Castle, MD;  Location: [facility];  Service: Endoscopy;  Laterality: N/A;  . LEFT HEART CATH AND CORONARY ANGIOGRAPHY N/A 10/22/2017   Procedure: LEFT HEART CATH AND CORONARY ANGIOGRAPHY;  Surgeon: Jettie Booze, MD;  Location: [place] [facility];  Service: Cardiovascular;  Laterality: N/A;  . 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 Right knee replacement  2005    Social History   Socioeconomic  History  . Marital status: Widowed    Spouse name: Not on file  . Number of children: 4  . Years of education: Not on file  . Highest education level: Not on file  Occupational History  . Occupation: disability    Employer: UNEMPLOYED  Social Needs  . Financial resource strain: Not on file  . Food insecurity:    Worry: Not on file    Inability: Not on file  . Transportation needs:    Medical: Not on file    Non-medical: Not on file  Tobacco Use  . Smoking status: Former Smoker    Packs/day: 0.20    Years: 4.00    Pack years: 0.80    Types: Cigarettes    Last attempt to quit: 05/13/1995    Years since quitting: 22.5  . Smokeless tobacco: Never Used  Substance and Sexual Activity  . Alcohol use: Not Currently  . Drug use: No  . Sexual activity: Not Currently  Lifestyle  . Physical activity:    Days per week: Not on file    Minutes per session: Not on file  . Stress: Not on file  Relationships  . Social connections:    Talks on phone: Not on file    Gets together: Not on file    Attends religious service: Not on file    Active member of club or organization: Not on file    Attends meetings of clubs or organizations: Not on file    Relationship status: Not  on file  . Intimate partner violence:    Fear of current or ex partner: Not on file    Emotionally abused: Not on file    Physically abused: Not on file    Forced sexual activity: Not on file  Other Topics Concern  . Not on file  Social History Narrative   Widow , lives by herself   Lost a son, 3 living    Lost husband    Current Outpatient Medications on File Prior to Visit  Medication Sig Dispense Refill  . ACCU-CHEK AVIVA PLUS test strip TEST BLOD SUGAR TWICE DAILY AND LANCETS TWICE DAILY (Patient not taking: Reported on 12/03/2017) 200 each 0  . ACCU-CHEK SOFTCLIX LANCETS lancets USE TO CHECK BLOOD SUGAR TWICE A DAY (Patient not taking: Reported on 12/03/2017) 100 each 0  . acetaminophen (TYLENOL) 325 MG  tablet Take 2 tablets (650 mg total) by mouth every 6 (six) hours as needed for mild pain (or Fever >/= 101). 20 tablet 1  . albuterol (PROVENTIL HFA;VENTOLIN HFA) 108 (90 Base) MCG/ACT inhaler Inhale 2 puffs into the lungs every 4 (four) hours as needed for wheezing. 1 Inhaler 6  . aspirin EC 81 MG tablet Take 1 tablet (81 mg total) by mouth daily. With food 30 tablet 5  . BD ULTRA-FINE PEN NEEDLES 29G X 12.7MM MISC USE TWICE A DAY (Patient not taking: Reported on 12/03/2017) 100 each 0  . budesonide-formoterol (SYMBICORT) 160-4.5 MCG/ACT inhaler Inhale 2 puffs into the lungs 2 (two) times daily. 10.2 g 5  . colchicine 0.6 MG tablet Take 1 tablet (0.6 mg total) by mouth 2 (two) times daily. 60 tablet 5  . latanoprost (XALATAN) 0.005 % ophthalmic solution USE 1 DROP IN BOTH EYES AT BEDTIME 2.5 mL 10  . levothyroxine (SYNTHROID, LEVOTHROID) 75 MCG tablet Take 1 tablet (75 mcg total) by mouth daily before breakfast. 30 tablet 5  . metoprolol tartrate (LOPRESSOR) 25 MG tablet Take 1 tablet (25 mg total) by mouth 2 (two) times daily. (Patient not taking: Reported on 12/03/2017) 60 tablet 5  . OXYGEN Inhale 3 L into the lungs continuous.     . pantoprazole (PROTONIX) 40 MG tablet Take 1 tablet (40 mg total) by mouth daily before breakfast. 90 tablet 0  . potassium chloride (K-DUR,KLOR-CON) 10 MEQ tablet Take 1 tablet (10 mEq total) by mouth daily. 30 tablet 5  . potassium chloride 20 MEQ TBCR Take 20 mEq by mouth daily. 14 tablet 0  . rosuvastatin (CRESTOR) 40 MG tablet Take 1 tablet (40 mg total) by mouth daily. (Patient taking differently: Take 40 mg by mouth every evening. ) 30 tablet 5  . telmisartan (MICARDIS) 40 MG tablet Take 40 mg by mouth daily.     No current facility-administered medications on file prior to visit.     No Known Allergies  Family History  Problem Relation Age of Onset  . Asthma Mother   . Stroke Mother   . Diabetes Other        M, B, S  . Hypertension Sister        M,  S,B  . Pancreatic cancer Brother   . Colon cancer Neg Hx   . Prostate cancer Neg Hx   . Breast cancer Neg Hx     BP 124/84 (BP Location: Right Arm, Patient Position: Sitting, Cuff Size: Normal)   Pulse (!) 116   Ht 5\' 9"  (1.753 m)   Wt 254 lb (115.2 kg)   SpO2 90%  Comment: 2 L'sO2  BMI 37.51 kg/m   Review of Systems She has lost 53 lbs since last ov    Objective:   Physical Exam VITAL SIGNS:  See vs page GENERAL: no distress Pulses: foot pulses are intact bilaterally.   MSK: no deformity of the feet or ankles.  CV: trace bilat leg edema, but there are bilat vv's.    Skin:  no ulcer on the feet or ankles.  normal color and temp on the feet and ankles.  Neuro: sensation is intact to touch on the feet and ankles.   Ext: There is bilateral onychomycosis of the toenails.   Lab Results  Component Value Date   HGBA1C 5.6 10/20/2017   Lab Results  Component Value Date   CREATININE 1.49 (H) 12/03/2017   BUN 11 12/03/2017   NA 142 12/03/2017   K 2.9 (L) 12/03/2017   CL 109 12/03/2017   CO2 23 12/03/2017       Assessment & Plan:  Weight loss: apparently due to recent illnesses: we'll follow Noncompliance with insulin dosing: we discussed Hypoglycemia: severe.   Renal insuff: she needs a reduced dosage of januvia.   Patient Instructions  Your blood pressure is high today.  Please see your primary care provider soon, to have it rechecked check your blood sugar once a day.  vary the time of day when you check, between before the 3 meals, and at bedtime.  also check if you have symptoms of your blood sugar being too high or too low.  please keep a record of the readings and bring it to your next appointment here.  please call us sooner if your blood sugar goes below 70, or if it stays over 200.   Please change the insulin to "Januvia."  I have sent a prescription to your pharmacy.   Please call or message [place] next week, to tell us how the blood sugar is doing.   Please come  back for a follow-up appointment in 2 months.

## 2017-12-04 NOTE — Discharge Instructions (Addendum)
Follow-up with your regular doctor as scheduled tomorrow for further management of your diabetes.  Your potassium was low today, please try to supplement with foods high in potassium.  I have attached a list of foods that are high in potassium to this paperwork. Please also start taking potassium daily. I have a prescription written for you for this.   Return to the ER if you have any new or worsening symptoms like chest pain, shortness of breath, feeling like you are going to pass out.

## 2017-12-04 NOTE — Patient Instructions (Addendum)
Your blood pressure is high today.  Please see your primary care provider soon, to have it rechecked check your blood sugar once a day.  vary the time of day when you check, between before the 3 meals, and at bedtime.  also check if you have symptoms of your blood sugar being too high or too low.  please keep a record of the readings and bring it to your next appointment here.  please call us sooner if your blood sugar goes below 70, or if it stays over 200.   Please change the insulin to "Januvia."  I have sent a prescription to your pharmacy.   Please call or message [place] next week, to tell us how the blood sugar is doing.   Please come back for a follow-up appointment in 2 months.

## 2017-12-04 NOTE — ED Notes (Signed)
PTAR called for transport home. 

## 2017-12-04 NOTE — ED Notes (Signed)
Pt's son called this RN to update that he will now be able to meet PTAR at 3:30, not 3:10 as previously discussed.

## 2017-12-04 NOTE — Telephone Encounter (Signed)
Received Client Missed Visit Report; [place]; forwarded to provider/SLS 07/26

## 2017-12-04 NOTE — Telephone Encounter (Signed)
Spoke w/ Sharyn Lull- verbal orders given.

## 2017-12-04 NOTE — ED Notes (Signed)
Spoke with pt's son [name] who sts will meet PTAR at pt's house at 3:10 to let the patient inside.

## 2017-12-04 NOTE — Telephone Encounter (Signed)
Received Physician Orders from [facility]; forwarded to provider/SLS 07/26

## 2017-12-07 ENCOUNTER — Ambulatory Visit (HOSPITAL_BASED_OUTPATIENT_CLINIC_OR_DEPARTMENT_OTHER)
Admission: RE | Admit: 2017-12-07 | Discharge: 2017-12-07 | Disposition: A | Discharge status: Home / Self Care | Payer: Medicare Other | Source: Ambulatory Visit | Attending: Pulmonary Disease | Admitting: Pulmonary Disease

## 2017-12-07 ENCOUNTER — Other Ambulatory Visit: Payer: Medicare Other

## 2017-12-07 ENCOUNTER — Encounter: Payer: Self-pay | Admitting: Internal Medicine

## 2017-12-07 ENCOUNTER — Inpatient Hospital Stay: Payer: Medicare Other | Admitting: Internal Medicine

## 2017-12-07 ENCOUNTER — Telehealth: Payer: Self-pay | Admitting: Pharmacist

## 2017-12-07 ENCOUNTER — Ambulatory Visit (INDEPENDENT_AMBULATORY_CARE_PROVIDER_SITE_OTHER): Payer: Medicare Other | Admitting: Internal Medicine

## 2017-12-07 VITALS — BP 137/91 | HR 111 | Temp 98.4°F | Resp 18 | Wt 244.2 lb

## 2017-12-07 DIAGNOSIS — R197 Diarrhea, unspecified: Secondary | ICD-10-CM

## 2017-12-07 DIAGNOSIS — J849 Interstitial pulmonary disease, unspecified: Secondary | ICD-10-CM | POA: Insufficient documentation

## 2017-12-07 DIAGNOSIS — E876 Hypokalemia: Secondary | ICD-10-CM

## 2017-12-07 DIAGNOSIS — J9611 Chronic respiratory failure with hypoxia: Secondary | ICD-10-CM | POA: Diagnosis not present

## 2017-12-07 DIAGNOSIS — I700 Atherosclerosis of aorta: Secondary | ICD-10-CM | POA: Diagnosis not present

## 2017-12-07 DIAGNOSIS — I2510 Atherosclerotic heart disease of native coronary artery without angina pectoris: Secondary | ICD-10-CM | POA: Diagnosis not present

## 2017-12-07 DIAGNOSIS — J45909 Unspecified asthma, uncomplicated: Secondary | ICD-10-CM | POA: Diagnosis not present

## 2017-12-07 DIAGNOSIS — R0602 Shortness of breath: Secondary | ICD-10-CM | POA: Diagnosis not present

## 2017-12-07 DIAGNOSIS — J449 Chronic obstructive pulmonary disease, unspecified: Secondary | ICD-10-CM | POA: Diagnosis not present

## 2017-12-07 DIAGNOSIS — R1011 Right upper quadrant pain: Secondary | ICD-10-CM

## 2017-12-07 DIAGNOSIS — I159 Secondary hypertension, unspecified: Secondary | ICD-10-CM

## 2017-12-07 DIAGNOSIS — R262 Difficulty in walking, not elsewhere classified: Secondary | ICD-10-CM | POA: Diagnosis not present

## 2017-12-07 DIAGNOSIS — M1990 Unspecified osteoarthritis, unspecified site: Secondary | ICD-10-CM | POA: Diagnosis not present

## 2017-12-07 DIAGNOSIS — R945 Abnormal results of liver function studies: Secondary | ICD-10-CM | POA: Diagnosis not present

## 2017-12-07 DIAGNOSIS — I10 Essential (primary) hypertension: Secondary | ICD-10-CM

## 2017-12-07 DIAGNOSIS — D649 Anemia, unspecified: Secondary | ICD-10-CM

## 2017-12-07 DIAGNOSIS — Z9981 Dependence on supplemental oxygen: Secondary | ICD-10-CM | POA: Diagnosis not present

## 2017-12-07 DIAGNOSIS — R7989 Other specified abnormal findings of blood chemistry: Secondary | ICD-10-CM

## 2017-12-07 DIAGNOSIS — Z7982 Long term (current) use of aspirin: Secondary | ICD-10-CM | POA: Diagnosis not present

## 2017-12-07 DIAGNOSIS — R000 Tachycardia, unspecified: Secondary | ICD-10-CM | POA: Diagnosis not present

## 2017-12-07 DIAGNOSIS — M6281 Muscle weakness (generalized): Secondary | ICD-10-CM | POA: Diagnosis not present

## 2017-12-07 DIAGNOSIS — Z7951 Long term (current) use of inhaled steroids: Secondary | ICD-10-CM | POA: Diagnosis not present

## 2017-12-07 DIAGNOSIS — Z794 Long term (current) use of insulin: Secondary | ICD-10-CM | POA: Diagnosis not present

## 2017-12-07 DIAGNOSIS — J9621 Acute and chronic respiratory failure with hypoxia: Secondary | ICD-10-CM | POA: Diagnosis not present

## 2017-12-07 DIAGNOSIS — E1149 Type 2 diabetes mellitus with other diabetic neurological complication: Secondary | ICD-10-CM | POA: Diagnosis not present

## 2017-12-07 LAB — HEMOGLOBIN: Hemoglobin: 11.5 g/dL — ABNORMAL LOW (ref 12.0–15.0)

## 2017-12-07 LAB — FERRITIN: Ferritin: 256.6 ng/mL (ref 10.0–291.0)

## 2017-12-07 LAB — COMPREHENSIVE METABOLIC PANEL
ALT: 34 U/L (ref 0–35)
AST: 41 U/L — AB (ref 0–37)
Albumin: 3.4 g/dL — ABNORMAL LOW (ref 3.5–5.2)
Alkaline Phosphatase: 62 U/L (ref 39–117)
BILIRUBIN TOTAL: 0.7 mg/dL (ref 0.2–1.2)
BUN: 10 mg/dL (ref 6–23)
CO2: 27 mEq/L (ref 19–32)
Calcium: 9.2 mg/dL (ref 8.4–10.5)
Chloride: 104 mEq/L (ref 96–112)
Creatinine, Ser: 1.55 mg/dL — ABNORMAL HIGH (ref 0.40–1.20)
GFR: 42.68 mL/min — ABNORMAL LOW (ref >60.00)
Glucose, Bld: 90 mg/dL (ref 70–99)
Potassium: 3.7 mEq/L (ref 3.5–5.1)
SODIUM: 140 meq/L (ref 135–145)
TOTAL PROTEIN: 7.9 g/dL (ref 6.0–8.3)

## 2017-12-07 LAB — IRON: Iron: 31 ug/dL — ABNORMAL LOW (ref 42–145)

## 2017-12-07 MED ORDER — METOPROLOL TARTRATE 25 MG PO TABS: 25.0000 mg | ORAL_TABLET | Freq: Two times a day (BID) | ORAL | 5 refills | Status: DC

## 2017-12-07 NOTE — Patient Outreach (Signed)
[facility]) Care Management  12/07/2017  [name] 05/01/1951 [id number]   Patient was called regarding medication management per referral. Unfortunately, she did not answer the phone. On both attempts, patient's phone rang >20 times and a recording came on requesting the caller to put in a "remote access code".    Plan: Send an unsuccessful contact letter Call patient back within 3-5 business days.   [name], PharmD, [place] Clinical Pharmacist [phone]

## 2017-12-07 NOTE — Patient Instructions (Signed)
GO TO THE LAB : Get the blood work     GO TO THE FRONT DESK Schedule your next appointment for a checkup in 4 weeks   Take your medications according to the list provided

## 2017-12-07 NOTE — Telephone Encounter (Signed)
Orders signed and faxed to [facility] at [phone]. Forms sent for scanning.  

## 2017-12-07 NOTE — Progress Notes (Signed)
Subjective:    Patient ID: [name], female    DOB: 01-12-1951, 67 y.o.   MRN: [id number]  DOS:  12/07/2017 Type of visit - description :  Interval history: Since the last office visit 11/02/2017 she went to the hospital twice for an admission and a ER visit.  She was admitted 11/17/2017 and discharged 11/20/2017.  She had multiple symptoms including palpitations, abdominal versus chest pain. During the hospital stay, she was on telemetry, troponins were slightly elevated felt to be due to renal insufficiency, chest pain was reproducible . She did have an unresponsive episode during the hospital stay, saw neurology, EEG and brain MRI unremarkable, case was discussed with neurology and she was okay for discharge. Creatinine was elevated, ultrasound of the kidneys normal, micardis stopped. Developed diarrhea, C. difficile negative  Went to the ER 12/03/2017 via EMS feeling unwell. Initial CBG was 20, she received D50, symptoms completely resolved after CBG improved to 215. Potassium was low and it was replaced. Creatinine improved from previous readings  Troponin was elevated as previously.  Not felt to be clinically significant   Review of Systems Since she left the ER, she is feeling "okay". Shortness of breath at baseline Continue with chest pain, she points to the lower anterior chest and right upper quadrant of the abdomen, "like needles". Admits to occasional nausea.  Having diarrhea, most nights, watery, nonbloody.  Taking colchicine as prescribed.   Past Medical History:  Diagnosis Date  . Anterolisthesis    Grade 1, L4-5  . Bronchospasm 05/28/2012  . CHEST PAIN 11/18/2007  . DEGENERATIVE JOINT DISEASE 10/06/2006  . DEPRESSION 09/26/2008  . DIABETES MELLITUS 10/06/2006  . Diverticulosis    [id number]  . GERD (gastroesophageal reflux disease) 07/25/2013  . HYPERLIPIDEMIA 01/11/2009  . HYPERTENSION 10/06/2006  . INSOMNIA 09/26/2008  . Internal hemorrhoids   . OBSTRUCTIVE  SLEEP APNEA 06/23/2008   Severe OSA per sleep study 2010, Rx a CPAP  . Pain in joint, multiple sites 11/10/2006  . UNSPECIFIED ANEMIA 12/10/2009  . UTI (urinary tract infection) 11/2017    Past Surgical History:  Procedure Laterality Date  . COLONOSCOPY  08/01/2011   Procedure: COLONOSCOPY;  Surgeon: Inda Castle, MD;  Location: [facility];  Service: Endoscopy;  Laterality: N/A;  . LEFT HEART CATH AND CORONARY ANGIOGRAPHY N/A 10/22/2017   Procedure: LEFT HEART CATH AND CORONARY ANGIOGRAPHY;  Surgeon: Jettie Booze, MD;  Location: [place] [facility];  Service: Cardiovascular;  Laterality: N/A;  . 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 Right knee replacement  2005    Social History   Socioeconomic History  . Marital status: Widowed    Spouse name: Not on file  . Number of children: 4  . Years of education: Not on file  . Highest education level: Not on file  Occupational History  . Occupation: disability    Employer: UNEMPLOYED  Social Needs  . Financial resource strain: Not on file  . Food insecurity:    Worry: Not on file    Inability: Not on file  . Transportation needs:    Medical: Not on file    Non-medical: Not on file  Tobacco Use  . Smoking status: Former Smoker    Packs/day: 0.20    Years: 4.00    Pack years: 0.80    Types: Cigarettes    Last attempt to quit: 05/13/1995    Years since quitting: 22.5  . Smokeless tobacco: Never Used  Substance and Sexual Activity  .  Alcohol use: Not Currently  . Drug use: No  . Sexual activity: Not Currently  Lifestyle  . Physical activity:    Days per week: Not on file    Minutes per session: Not on file  . Stress: Not on file  Relationships  . Social connections:    Talks on phone: Not on file    Gets together: Not on file    Attends religious service: Not on file    Active member of club or organization: Not on file    Attends meetings of clubs or organizations: Not on file    Relationship status: Not on file  .  Intimate partner violence:    Fear of current or ex partner: Not on file    Emotionally abused: Not on file    Physically abused: Not on file    Forced sexual activity: Not on file  Other Topics Concern  . Not on file  Social History Narrative   Widow , lives by herself   Lost a son, 3 living    Lost husband      Allergies as of 12/07/2017   No Known Allergies     Medication List        Accurate as of 12/07/17 11:59 PM. Always use your most recent med list.          ACCU-CHEK AVIVA PLUS test strip Generic drug:  glucose blood TEST BLOD SUGAR TWICE DAILY AND LANCETS TWICE DAILY   ACCU-CHEK SOFTCLIX LANCETS lancets USE TO CHECK BLOOD SUGAR TWICE A DAY   acetaminophen 325 MG tablet Commonly known as:  TYLENOL Take 2 tablets (650 mg total) by mouth every 6 (six) hours as needed for mild pain (or Fever >/= 101).   albuterol 108 (90 Base) MCG/ACT inhaler Commonly known as:  PROVENTIL HFA;VENTOLIN HFA Inhale 2 puffs into the lungs every 4 (four) hours as needed for wheezing.   aspirin EC 81 MG tablet Take 1 tablet (81 mg total) by mouth daily. With food   BD ULTRA-FINE PEN NEEDLES 29G X 12.7MM Misc Generic drug:  Insulin Pen Needle USE TWICE A DAY   budesonide-formoterol 160-4.5 MCG/ACT inhaler Commonly known as:  SYMBICORT Inhale 2 puffs into the lungs 2 (two) times daily.   colchicine 0.6 MG tablet Take 1 tablet (0.6 mg total) by mouth 2 (two) times daily.   latanoprost 0.005 % ophthalmic solution Commonly known as:  XALATAN USE 1 DROP IN BOTH EYES AT BEDTIME   levothyroxine 75 MCG tablet Commonly known as:  SYNTHROID, LEVOTHROID Take 1 tablet (75 mcg total) by mouth daily before breakfast.   metoprolol tartrate 25 MG tablet Commonly known as:  LOPRESSOR Take 1 tablet (25 mg total) by mouth 2 (two) times daily.   OXYGEN Inhale 3 L into the lungs continuous.   pantoprazole 40 MG tablet Commonly known as:  PROTONIX Take 1 tablet (40 mg total) by mouth  daily before breakfast.   potassium chloride 10 MEQ tablet Commonly known as:  K-DUR,KLOR-CON Take 1 tablet (10 mEq total) by mouth daily.   rosuvastatin 40 MG tablet Commonly known as:  CRESTOR Take 1 tablet (40 mg total) by mouth daily.   sitaGLIPtin 50 MG tablet Commonly known as:  JANUVIA Take 1 tablet (50 mg total) by mouth daily.          Objective:   Physical Exam BP (!) 137/91 (BP Location: Left Arm, Patient Position: Sitting, Cuff Size: Normal)   Pulse (!) 111   Temp  98.4 F (36.9 C) (Oral)   Resp 18   Wt 244 lb 3.2 oz (110.8 kg)   SpO2 98%   BMI 36.06 kg/m  General:   Well developed, sitting in a wheelchair, with a oxygen cannula. HEENT:  Normocephalic . Face symmetric, atraumatic Lungs:  Decreased breath sounds, dry crackles at bases Normal respiratory effort, no intercostal retractions, no accessory muscle use. Heart: RRR,  no murmur.  no pretibial edema bilaterally  Abdomen:  Not distended, soft, slightly tender at the right upper abdomen? Skin: Not pale. Not jaundice Neurologic:  alert & oriented X3.  Speech normal, gait not tested. Psych--  Cooperative with normal attention span and concentration.  Behavior appropriate. No anxious or depressed appearing.     Assessment & Plan:   Assessment DM per endocrinology HTN Hyperlipidemia Depression, insomnia (citalopram caused headache ? 05-2014) MSK: --DJD, saw ortho ~06-2014 --Multiple joint pains, 2008, sedimentation rate, RA >> negative --Back pain>> -- MRI 2007: Epidural lipomatosis, congenitally short pedicles, spondylosis, and disc bulges and protrusions contribute to central, foraminal, and subarticular lateral recess stenoses as detailed above.  --2008, saw Ortho, Rx local injections  --local injections back Dr Mina Marble  ~ 12-2015 GERD Morbid obesity Pulmonary -Chronic respiratory failure  -asthma -OSA dx 2010, severe, nocturnal desaturation, intolerant to CPAP, on nocturnal O2 -ONO  01/2017:  + Hypoxia at night, rec  to continue oxygen at night unless able  to tolerate a CPAP   DOE, chronic CP, chronic RUQ pain:: 2004--Cath neg, areterial LE dopplers (-) 06-2003-- abd. 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  Respiratory failure : On oxygen, O2 sat very good today. DM: Last seen by Endo 12/04/2017, had severe hypoglycemia, currently on Januvia only. HTN: micardis and metoprolol were noted to be on her list, she is not taking those medicines.  Plan: Refill metoprolol, remove Micardis from her medication list as it was discontinue during the recent hospitalization.  BP today satisfactory. Hypokalemia: Currently on potassium 10 mEq qd per patient, did not get to take the extra potassium prescribed by the ER.  Rechecking labs today. Hypothyroidism: Last TSH elevated, on Synthroid.  Recheck on RTC Elevated LFTs: mild elevation , just recently noted, h/o Hep C (-) 2016, check hepatitis B serology and a liver ultrasound Microcytic anemia: Checking a hemoglobin, iron and ferritin. Diarrhea,  possibly related to colchicine intake C. difficile negative recently at the hospital. Observe for now Abdominal pain: @ the RUQ abdomen, "like needles", slightly TTP on exam,  check a [place]. RTC 4 weeks  Today, I spent more than 45   min with the patient: >50% of the time counseling regards her med compliance, trying to figure out what she really is taking, reviewing extensive medical records, explaining the patient why diarrhea is likely to be due to colchicine.

## 2017-12-08 ENCOUNTER — Ambulatory Visit: Payer: Self-pay | Admitting: Internal Medicine

## 2017-12-08 ENCOUNTER — Other Ambulatory Visit: Payer: Self-pay

## 2017-12-08 DIAGNOSIS — Z794 Long term (current) use of insulin: Secondary | ICD-10-CM | POA: Diagnosis not present

## 2017-12-08 DIAGNOSIS — J45909 Unspecified asthma, uncomplicated: Secondary | ICD-10-CM | POA: Diagnosis not present

## 2017-12-08 DIAGNOSIS — Z9981 Dependence on supplemental oxygen: Secondary | ICD-10-CM | POA: Diagnosis not present

## 2017-12-08 DIAGNOSIS — Z7951 Long term (current) use of inhaled steroids: Secondary | ICD-10-CM | POA: Diagnosis not present

## 2017-12-08 DIAGNOSIS — R262 Difficulty in walking, not elsewhere classified: Secondary | ICD-10-CM | POA: Diagnosis not present

## 2017-12-08 DIAGNOSIS — R000 Tachycardia, unspecified: Secondary | ICD-10-CM | POA: Diagnosis not present

## 2017-12-08 DIAGNOSIS — J9621 Acute and chronic respiratory failure with hypoxia: Secondary | ICD-10-CM | POA: Diagnosis not present

## 2017-12-08 DIAGNOSIS — I10 Essential (primary) hypertension: Secondary | ICD-10-CM | POA: Diagnosis not present

## 2017-12-08 DIAGNOSIS — E1149 Type 2 diabetes mellitus with other diabetic neurological complication: Secondary | ICD-10-CM | POA: Diagnosis not present

## 2017-12-08 DIAGNOSIS — J449 Chronic obstructive pulmonary disease, unspecified: Secondary | ICD-10-CM | POA: Diagnosis not present

## 2017-12-08 DIAGNOSIS — M1990 Unspecified osteoarthritis, unspecified site: Secondary | ICD-10-CM | POA: Diagnosis not present

## 2017-12-08 DIAGNOSIS — M6281 Muscle weakness (generalized): Secondary | ICD-10-CM | POA: Diagnosis not present

## 2017-12-08 DIAGNOSIS — Z7982 Long term (current) use of aspirin: Secondary | ICD-10-CM | POA: Diagnosis not present

## 2017-12-08 LAB — HEPATITIS B CORE ANTIBODY, TOTAL: HEP B C TOTAL AB: NONREACTIVE

## 2017-12-08 LAB — HEPATITIS B SURFACE ANTIGEN: Hepatitis B Surface Ag: NONREACTIVE

## 2017-12-08 NOTE — Telephone Encounter (Signed)
Pt's PT aide  (at [facility]) called to report pt's BP and chest pain. Pt stated pain is located under left breast and stomach. Pt stated that she "always has it" and then pt stated that the pain "comes and goes."  Pt stated that she has had the chest pain for a few months. Pt rated the pain as a 9/10 and describes the pain as "pins and needles."  Pt states she is having nausea, dizziness with standing and difficulty breathing "all the time."  Per pt's chart she has chronic respiratory failure and is on oxygen.  BP elevated. Initial BP was 160/98 and repeat BP 160/102. Pt has a h/o of hypertension. Pt denies headache, blurred vision, weakness.  Disposition was to advise pt to go to the ED. Pt has a office visit yesterday and c/o chest pins and needles.  Called PCP office and spoke with Dr Larose Kells. Dr Larose Kells recommended for the pt to increase Metoprolol to 2 tablets in the am and 2 tablets in the evening. Dr Larose Kells ordered to have pt's BP checked and to call back Thursday morning with the BP reading. Reason for Disposition . [9] Systolic BP  >= 937 OR Diastolic >= 169 AND [6] cardiac or neurologic symptoms (e.g., chest pain, difficulty breathing, unsteady gait, blurred vision)    Called PCP and spoke with Dr Larose Kells. Dr Larose Kells ordered pt to take 2 of the metoprolol in the morning and 2 metoprolol in the evening and to call back with her BP Thursday morning.  Additional Information . Commented on: Difficulty breathing    Pt with chronic respiratory failure per OV notes from yesterday . Commented on: SEVERE chest pain    Pt stated pain is pins and needles that are present "all the time"  Answer Assessment - Initial Assessment Questions 1. LOCATION: "Where does it hurt?"       Pain under left breast and stomach 2. RADIATION: "Does the pain go anywhere else?" (e.g., into neck, jaw, arms, back)     no 3. ONSET: "When did the chest pain begin?" (Minutes, hours or days)      Pt states she always 4. PATTERN "Does the  pain come and go, or has it been constant since it started?"  "Does it get worse with exertion?"    Comes and goes 5. DURATION: "How long does it last" (e.g., seconds, minutes, hours)   Few months now 6. SEVERITY: "How bad is the pain?"  (e.g., Scale 1-10; mild, moderate, or severe)    - MILD (1-3): doesn't interfere with normal activities     - MODERATE (4-7): interferes with normal activities or awakens from sleep    - SEVERE (8-10): excruciating pain, unable to do any normal activities     9/10 "feels like needles" 7. CARDIAC RISK FACTORS: "Do you have any history of heart problems or risk factors for heart disease?" (e.g., prior heart attack, angina; high blood pressure, diabetes, being overweight, high cholesterol, smoking, or strong family history of heart disease)     High blood pressure, diabetic, overweight, high cholesterol,  8. PULMONARY RISK FACTORS: "Do you have any history of lung disease?"  (e.g., blood clots in lung, asthma, emphysema, birth control pills)  no 9. CAUSE: "What do you think is causing the chest pain?"     Pt doesn't know 10. OTHER SYMPTOMS: "Do you have any other symptoms?" (e.g., dizziness, nausea, vomiting, sweating, fever, difficulty breathing, cough)       Nausea, dizziness, difficulty breathing "all  the time" she is on Oxygen 11. PREGNANCY: "Is there any chance you are pregnant?" "When was your last menstrual period?"       n/a  Answer Assessment - Initial Assessment Questions 1. BLOOD PRESSURE: "What is the blood pressure?" "Did you take at least two measurements 5 minutes apart?"     160/98 and BP retaken during the call: 160/102 2. ONSET: "When did you take your blood pressure?"     First one just before calling and 2 nd BP during the call 3. HOW: "How did you obtain the blood pressure?" (e.g., visiting nurse, automatic home BP monitor)     manual 4. HISTORY: "Do you have a history of high blood pressure?"     yes 5. MEDICATIONS: "Are you taking any  medications for blood pressure?" "Have you missed any doses recently?"     Yes- no 6. OTHER SYMPTOMS: "Do you have any symptoms?" (e.g., headache, chest pain, blurred vision, difficulty breathing, weakness)     Chest pain, is on Oxygen weak all the time 7. PREGNANCY: "Is there any chance you are pregnant?" "When was your last menstrual period?"     n/a  Protocols used: HIGH BLOOD PRESSURE-A-AH, CHEST PAIN-A-AH

## 2017-12-08 NOTE — Telephone Encounter (Signed)
I received a phone call regards [name].  Home health take is checking on her. BP was 160/98, 160/ 102. She reported left breast and stomach pain, "like needles", "always have it".  She also reports chronic shortness of breath. [facility] nurse concerned about the symptoms, on chart review these are chronic problems, recent cardiac catheterization negative, she has chronic hypoxia. Yesterday we refilled metoprolol 25 mg twice a day, unclear if she has taken any today. Plan: Do take metoprolol, 25 mg, will increase to 2 tablets BID.  Call with BP readings in 2 days. ER if she has unusual or different or intense chest pain or difficulty breathing.

## 2017-12-08 NOTE — Assessment & Plan Note (Signed)
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  Respiratory failure  : On oxygen, O2 sat very good today. DM: Last seen by Endo 12/04/2017, had severe hypoglycemia, currently on Januvia only. HTN: micardis and metoprolol were noted to be on her list, she is not taking those medicines.  Plan: Refill metoprolol, remove Micardis from her medication list as it was discontinue during the recent hospitalization.  BP today satisfactory. Hypokalemia: Currently on potassium 10 mEq qd per patient, did not get to take the extra potassium prescribed by the ER.  Rechecking labs today. Hypothyroidism: Last TSH elevated, on Synthroid.  Recheck on RTC Elevated LFTs: mild elevation , just recently noted, h/o Hep C (-) 2016, check hepatitis B serology and a liver ultrasound Microcytic anemia: Checking a hemoglobin, iron and ferritin. Diarrhea,  possibly related to colchicine intake C. difficile negative recently at the hospital. Observe for now Abdominal pain: @ the RUQ abdomen, "like needles", slightly TTP on exam,  check a [place]. RTC 4 weeks

## 2017-12-08 NOTE — Patient Outreach (Signed)
[place] [facility]) Care Management  12/08/2017  [name] [birth year] [id number]    Follow up call to [name] regarding request for PCS.  [name] reported that she currently has home health services but she was unsure of the status of PCS. She reported that she has someone coming to her home today but she was not sure what agency they are with.  [name] said that she has difficulty keeping track of all of the healthcare professionals coming in and out of her home. BSW called [employer] to determine status of PCS.  It was reported that she should be receiving services from [facility].  BSW contacted this agency to confirm and to determine how many hours of service she was approved for.  It was reported that she should receive 80 hours per month and that someone from their agency would be seeing [name] today.     BSW called [name] to inform her of this.  BSW is closing case at this time due to no other social work needs being identified.    Ronn Melena, BSW Social Worker [phone]

## 2017-12-10 ENCOUNTER — Other Ambulatory Visit: Payer: Self-pay

## 2017-12-10 ENCOUNTER — Other Ambulatory Visit: Payer: Self-pay | Admitting: Pharmacist

## 2017-12-10 ENCOUNTER — Telehealth: Payer: Self-pay

## 2017-12-10 DIAGNOSIS — I130 Hypertensive heart and chronic kidney disease with heart failure and stage 1 through stage 4 chronic kidney disease, or unspecified chronic kidney disease: Secondary | ICD-10-CM | POA: Diagnosis not present

## 2017-12-10 DIAGNOSIS — J9621 Acute and chronic respiratory failure with hypoxia: Secondary | ICD-10-CM | POA: Diagnosis not present

## 2017-12-10 DIAGNOSIS — Z96651 Presence of right artificial knee joint: Secondary | ICD-10-CM | POA: Diagnosis not present

## 2017-12-10 DIAGNOSIS — K219 Gastro-esophageal reflux disease without esophagitis: Secondary | ICD-10-CM | POA: Diagnosis not present

## 2017-12-10 DIAGNOSIS — Z96652 Presence of left artificial knee joint: Secondary | ICD-10-CM | POA: Diagnosis not present

## 2017-12-10 DIAGNOSIS — Z9981 Dependence on supplemental oxygen: Secondary | ICD-10-CM | POA: Diagnosis not present

## 2017-12-10 DIAGNOSIS — R000 Tachycardia, unspecified: Secondary | ICD-10-CM | POA: Diagnosis not present

## 2017-12-10 DIAGNOSIS — E1122 Type 2 diabetes mellitus with diabetic chronic kidney disease: Secondary | ICD-10-CM | POA: Diagnosis not present

## 2017-12-10 DIAGNOSIS — J45909 Unspecified asthma, uncomplicated: Secondary | ICD-10-CM | POA: Diagnosis not present

## 2017-12-10 DIAGNOSIS — E041 Nontoxic single thyroid nodule: Secondary | ICD-10-CM

## 2017-12-10 DIAGNOSIS — G4733 Obstructive sleep apnea (adult) (pediatric): Secondary | ICD-10-CM | POA: Diagnosis not present

## 2017-12-10 DIAGNOSIS — E039 Hypothyroidism, unspecified: Secondary | ICD-10-CM | POA: Diagnosis not present

## 2017-12-10 DIAGNOSIS — E1149 Type 2 diabetes mellitus with other diabetic neurological complication: Secondary | ICD-10-CM | POA: Diagnosis not present

## 2017-12-10 DIAGNOSIS — R0902 Hypoxemia: Secondary | ICD-10-CM | POA: Diagnosis not present

## 2017-12-10 DIAGNOSIS — Z7951 Long term (current) use of inhaled steroids: Secondary | ICD-10-CM | POA: Diagnosis not present

## 2017-12-10 DIAGNOSIS — Z7982 Long term (current) use of aspirin: Secondary | ICD-10-CM | POA: Diagnosis not present

## 2017-12-10 DIAGNOSIS — Z794 Long term (current) use of insulin: Secondary | ICD-10-CM | POA: Diagnosis not present

## 2017-12-10 DIAGNOSIS — I5031 Acute diastolic (congestive) heart failure: Secondary | ICD-10-CM | POA: Diagnosis not present

## 2017-12-10 DIAGNOSIS — N183 Chronic kidney disease, stage 3 (moderate): Secondary | ICD-10-CM | POA: Diagnosis not present

## 2017-12-10 NOTE — Telephone Encounter (Signed)
Copied from [place] [id number]. Topic: Quick Communication - See Telephone Encounter >> Dec 10, 2017  3:23 PM Hewitt Shorts wrote: Alwyn Ren with [facility]  has questions about levothyroxine and which dosage should  she be taking 7.5 micro miligrams  or 112 micro miligrams   [place] [phone]

## 2017-12-10 NOTE — Patient Outreach (Addendum)
[place] [facility]) Care Management  [place]   12/10/2017  [name] Oct 30, 1950 [id number]  Subjective: Patient was called regarding medication management per referral.  HIPAA identifiers were obtained. Patient is a 67 y.o. female with multiple medical conditions including but not limited to:   hypertension, hyperlipidemia, type 2 diabetes, OSA on CPAP, morbid obesity, asthma, depression, and GERD. Patient was recently hospitalized for tachycardia (she was not taking her metoprolol) and visited the ED for hypoglycemia.  Patient reported managing her medications on her own and that she uses a pill box. Patient said she is able to fill the pill box on her own but would like some assistance.  She said she would be open to pill packing.   Objective:   HgA1c- 5.6% (insulin discontinued by Dr. Loanne Drilling at 12/04/17 visit)  Encounter Medications: Outpatient Encounter Medications as of 12/10/2017  Medication Sig  . ACCU-CHEK AVIVA PLUS test strip TEST BLOD SUGAR TWICE DAILY AND LANCETS TWICE DAILY  . ACCU-CHEK SOFTCLIX LANCETS lancets USE TO CHECK BLOOD SUGAR TWICE A DAY  . acetaminophen (TYLENOL) 325 MG tablet Take 2 tablets (650 mg total) by mouth every 6 (six) hours as needed for mild pain (or Fever >/= 101).  Marland Kitchen albuterol (PROVENTIL HFA;VENTOLIN HFA) 108 (90 Base) MCG/ACT inhaler Inhale 2 puffs into the lungs every 4 (four) hours as needed for wheezing.  Marland Kitchen aspirin EC 81 MG tablet Take 1 tablet (81 mg total) by mouth daily. With food  . budesonide-formoterol (SYMBICORT) 160-4.5 MCG/ACT inhaler Inhale 2 puffs into the lungs 2 (two) times daily.  . colchicine 0.6 MG tablet Take 1 tablet (0.6 mg total) by mouth 2 (two) times daily.  Marland Kitchen latanoprost (XALATAN) 0.005 % ophthalmic solution USE 1 DROP IN BOTH EYES AT BEDTIME  . levothyroxine (SYNTHROID, LEVOTHROID) 75 MCG tablet Take 1 tablet (75 mcg total) by mouth daily before breakfast.  . metoprolol tartrate (LOPRESSOR) 25  MG tablet Take 1 tablet (25 mg total) by mouth 2 (two) times daily.  . OXYGEN Inhale 3 L into the lungs continuous.   . pantoprazole (PROTONIX) 40 MG tablet Take 1 tablet (40 mg total) by mouth daily before breakfast.  . potassium chloride (K-DUR,KLOR-CON) 10 MEQ tablet Take 1 tablet (10 mEq total) by mouth daily.  . rosuvastatin (CRESTOR) 40 MG tablet Take 1 tablet (40 mg total) by mouth daily. (Patient taking differently: Take 40 mg by mouth every evening. )  . sitaGLIPtin (JANUVIA) 50 MG tablet Take 1 tablet (50 mg total) by mouth daily.  . [DISCONTINUED] BD ULTRA-FINE PEN NEEDLES 29G X 12.7MM MISC USE TWICE A DAY (Patient not taking: Reported on 12/10/2017)   No facility-administered encounter medications on file as of 12/10/2017.     Functional Status: In your present state of health, do you have any difficulty performing the following activities: 11/18/2017 11/12/2017  Hearing? N N  Vision? N N  Difficulty concentrating or making decisions? N N  Walking or climbing stairs? Y Y  Comment - -  Dressing or bathing? N Y  Doing errands, shopping? Y Y  Comment - Son help patient  Preparing Food and eating ? - -  Using the Toilet? - -  In the past six months, have you accidently leaked urine? - -  Do you have problems with loss of bowel control? - -  Managing your Medications? - -  Managing your Finances? - -  Housekeeping or managing your Housekeeping? - -  Comment - -  Some  recent data might be hidden    Fall/Depression Screening: Fall Risk  10/26/2017 08/06/2017 03/31/2017  Falls in the past year? No Yes Yes  Number falls in past yr: - 1 1  Injury with Fall? - No No  Comment - - -  Risk for fall due to : - History of fall(s) -  Risk for fall due to: Comment - patient reports that she fell once last summer when she slipped during mopping her floor; no loss of consiousness and no injury -  Follow up - Falls prevention discussed;Education provided Education provided;Falls prevention  discussed   PHQ 2/9 Scores 10/26/2017 08/06/2017 03/31/2017 03/26/2016 10/31/2015 02/22/2015 10/26/2013  PHQ - 2 Score 3 1 2  0 0 0 6  PHQ- 9 Score 13 - 5 - - - -  Exception Documentation - - - - - Other- indicate reason in comment box -  Not completed - - - - - Pt's son passed in June 2016 -      Assessment: Patient's medications were reviewed via telephone.   Drugs sorted by system:  Cardiovascular: Aspirin, Metoprolol, Rosuvastatin,   Pulmonary/Allergy: Symbicort, Albuterol HFA, Oxygen,   Gastrointestinal: Pantoprazole,  Endocrine: Levothyroxine, Januvia  Pain: Acetaminophen  Vitamins/Minerals: Potassium,   Miscellaneous: Colchicine, Latanoprost,    Medication Review Findings:  Drug interactions: -Colchicine/Rosuvastatin-increased risk of myopathy  Dose Discrepancy: Levothyroxine 75 mcg vs Levothyroxine 112 mcg-- patient reported taking Levothyroxine 112 mcg. Levothyroxine 75 mcg is on her medication list.  Levothyroxine 75 mcg was filled on 10/25/17, levothyroxine 112 mcg was filled on 11/14/17.  Patient reported having both medication strengths in her possession.  Adherence: Metoprolol-patient has still not picked up metoprolol from her local pharmacy. She said she was planning to pick it up today.  Plan: -Dr. Larose Kells' office was called and a message was left to get clarification on the Levothyroxine dose. -Will route note to Dr Larose Kells' office. -Patient is interested in pill packing will investigate options and discuss with patient at follow up call.   [name], PharmD, [place] Clinical Pharmacist [phone]

## 2017-12-10 NOTE — Patient Outreach (Signed)
[name]) Care Management  12/10/2017  [name] 1950/06/07 [id number]   Dr. Larose Kells' nurse called me back to clarify the patient's Levothyroxine dose. Patient should be taking levothyroxine 57mcg.    Patient was called and communicated the correct dose. Patient said she would remove the Levothyroxine 13mcg tablets from her active medications.  Plan: Continue with previously scheduled phone call.  [name], PharmD, [place] Clinical Pharmacist [phone]

## 2017-12-10 NOTE — Telephone Encounter (Signed)
Spoke w/ Alwyn Ren- informed that med list is showing Pt should be on levothyroxine 70mcg.daily.

## 2017-12-10 NOTE — Progress Notes (Signed)
Spoke with patient. She stated that she has not seen a rheumatologist yet. Reviewed her chart and see that a referral has been placed to [facility]. Patient has also agreed to having a ultrasound of her thyroid. Advised her that I would place the order today.   Nothing further needed at time of call.

## 2017-12-11 ENCOUNTER — Ambulatory Visit
Admission: RE | Admit: 2017-12-11 | Discharge: 2017-12-11 | Disposition: A | Discharge status: Home / Self Care | Payer: Medicare Other | Source: Ambulatory Visit | Attending: Pulmonary Disease | Admitting: Pulmonary Disease

## 2017-12-11 ENCOUNTER — Encounter: Payer: Self-pay | Admitting: Neurology

## 2017-12-11 DIAGNOSIS — E042 Nontoxic multinodular goiter: Secondary | ICD-10-CM | POA: Diagnosis not present

## 2017-12-11 DIAGNOSIS — E041 Nontoxic single thyroid nodule: Secondary | ICD-10-CM

## 2017-12-14 ENCOUNTER — Other Ambulatory Visit: Payer: Self-pay | Admitting: Pharmacist

## 2017-12-14 DIAGNOSIS — Z96652 Presence of left artificial knee joint: Secondary | ICD-10-CM | POA: Diagnosis not present

## 2017-12-14 DIAGNOSIS — E1149 Type 2 diabetes mellitus with other diabetic neurological complication: Secondary | ICD-10-CM | POA: Diagnosis not present

## 2017-12-14 DIAGNOSIS — R000 Tachycardia, unspecified: Secondary | ICD-10-CM | POA: Diagnosis not present

## 2017-12-14 DIAGNOSIS — Z7982 Long term (current) use of aspirin: Secondary | ICD-10-CM | POA: Diagnosis not present

## 2017-12-14 DIAGNOSIS — K219 Gastro-esophageal reflux disease without esophagitis: Secondary | ICD-10-CM | POA: Diagnosis not present

## 2017-12-14 DIAGNOSIS — I130 Hypertensive heart and chronic kidney disease with heart failure and stage 1 through stage 4 chronic kidney disease, or unspecified chronic kidney disease: Secondary | ICD-10-CM | POA: Diagnosis not present

## 2017-12-14 DIAGNOSIS — N183 Chronic kidney disease, stage 3 (moderate): Secondary | ICD-10-CM | POA: Diagnosis not present

## 2017-12-14 DIAGNOSIS — Z96651 Presence of right artificial knee joint: Secondary | ICD-10-CM | POA: Diagnosis not present

## 2017-12-14 DIAGNOSIS — Z7951 Long term (current) use of inhaled steroids: Secondary | ICD-10-CM | POA: Diagnosis not present

## 2017-12-14 DIAGNOSIS — Z794 Long term (current) use of insulin: Secondary | ICD-10-CM | POA: Diagnosis not present

## 2017-12-14 DIAGNOSIS — J9621 Acute and chronic respiratory failure with hypoxia: Secondary | ICD-10-CM | POA: Diagnosis not present

## 2017-12-14 DIAGNOSIS — I5031 Acute diastolic (congestive) heart failure: Secondary | ICD-10-CM | POA: Diagnosis not present

## 2017-12-14 DIAGNOSIS — Z9981 Dependence on supplemental oxygen: Secondary | ICD-10-CM | POA: Diagnosis not present

## 2017-12-14 DIAGNOSIS — E039 Hypothyroidism, unspecified: Secondary | ICD-10-CM | POA: Diagnosis not present

## 2017-12-14 DIAGNOSIS — E1122 Type 2 diabetes mellitus with diabetic chronic kidney disease: Secondary | ICD-10-CM | POA: Diagnosis not present

## 2017-12-14 DIAGNOSIS — J45909 Unspecified asthma, uncomplicated: Secondary | ICD-10-CM | POA: Diagnosis not present

## 2017-12-14 NOTE — Patient Outreach (Signed)
[place] [facility]) Care Management  12/14/2017  [name] [birth year] [id number]   Called patient to follow up on her picking up metoprolol and to be sure was taking the correct levothyroxine dose. HIPAA identifiers were obtained.  Patient confirmed she picked up metoprolol this weekend and is taking it as prescribed.  She also said she is only taking the Levothyroxine 3mcg dose as was prescribed.    Plan: Conduct a home visit with Thea Silversmith, RN to better assess for pill packing and compliance.   [name], PharmD, [place] Clinical Pharmacist [phone]

## 2017-12-15 ENCOUNTER — Telehealth: Payer: Self-pay | Admitting: *Deleted

## 2017-12-15 DIAGNOSIS — K219 Gastro-esophageal reflux disease without esophagitis: Secondary | ICD-10-CM | POA: Diagnosis not present

## 2017-12-15 DIAGNOSIS — E039 Hypothyroidism, unspecified: Secondary | ICD-10-CM | POA: Diagnosis not present

## 2017-12-15 DIAGNOSIS — Z96652 Presence of left artificial knee joint: Secondary | ICD-10-CM | POA: Diagnosis not present

## 2017-12-15 DIAGNOSIS — J45909 Unspecified asthma, uncomplicated: Secondary | ICD-10-CM | POA: Diagnosis not present

## 2017-12-15 DIAGNOSIS — Z7982 Long term (current) use of aspirin: Secondary | ICD-10-CM | POA: Diagnosis not present

## 2017-12-15 DIAGNOSIS — R000 Tachycardia, unspecified: Secondary | ICD-10-CM | POA: Diagnosis not present

## 2017-12-15 DIAGNOSIS — Z96651 Presence of right artificial knee joint: Secondary | ICD-10-CM | POA: Diagnosis not present

## 2017-12-15 DIAGNOSIS — Z9981 Dependence on supplemental oxygen: Secondary | ICD-10-CM | POA: Diagnosis not present

## 2017-12-15 DIAGNOSIS — E1149 Type 2 diabetes mellitus with other diabetic neurological complication: Secondary | ICD-10-CM | POA: Diagnosis not present

## 2017-12-15 DIAGNOSIS — J9621 Acute and chronic respiratory failure with hypoxia: Secondary | ICD-10-CM | POA: Diagnosis not present

## 2017-12-15 DIAGNOSIS — E1122 Type 2 diabetes mellitus with diabetic chronic kidney disease: Secondary | ICD-10-CM | POA: Diagnosis not present

## 2017-12-15 DIAGNOSIS — I130 Hypertensive heart and chronic kidney disease with heart failure and stage 1 through stage 4 chronic kidney disease, or unspecified chronic kidney disease: Secondary | ICD-10-CM | POA: Diagnosis not present

## 2017-12-15 DIAGNOSIS — Z7951 Long term (current) use of inhaled steroids: Secondary | ICD-10-CM | POA: Diagnosis not present

## 2017-12-15 DIAGNOSIS — I5031 Acute diastolic (congestive) heart failure: Secondary | ICD-10-CM | POA: Diagnosis not present

## 2017-12-15 DIAGNOSIS — Z794 Long term (current) use of insulin: Secondary | ICD-10-CM | POA: Diagnosis not present

## 2017-12-15 DIAGNOSIS — N183 Chronic kidney disease, stage 3 (moderate): Secondary | ICD-10-CM | POA: Diagnosis not present

## 2017-12-15 NOTE — Telephone Encounter (Signed)
Received Episode Summary [place], Vital Sign Alert Report, and Client Missed Visit Report from [facility]; forwarded to provider/SLS 08/06

## 2017-12-16 ENCOUNTER — Telehealth: Payer: Self-pay | Admitting: Emergency Medicine

## 2017-12-16 ENCOUNTER — Other Ambulatory Visit: Payer: Self-pay

## 2017-12-16 NOTE — Patient Outreach (Signed)
[place] [facility]) Care Management  12/16/2017  [name] 17-Apr-1951 [id number]   RNCM received call from Lacinda Axon, [facility] care personal care service director who reports that one of her staff was stuck on client's lancet. Request RNCM follow up with client.  RNCM called client. Client reports that she was sick that day and laid the needle on the table. She states that she has picked up the lancets and is now putting them in a water bottle with the lid, throwing them in the trash and her son is taking her trash out. RNCM discussed the importance of maintaining a safe environment for people coming into her home. Client voiced understanding. RNCM recommended to use liquid laundry detergent bottle with screw cap, write sharps on the bottle and use this to discard lancets. Client voiced understanding.  upcoming appointment with cardiologist.   Plan: continue to follow, continue care coordination follow up with Ms. Monica Martinez.  Thea Silversmith, RN, MSN, [place] Coordinator Cell: [phone]   12/16/17 1645 Follow up:   RNCM provided a "Sharps" container to client and explained how to use. Client encouraged to make sure there are no exposed lancets around. Ms Monica Martinez notified. She reports they have a CNA scheduled to see client on Friday.  Thea Silversmith, RN, MSN, [place] Coordinator Cell: [phone]

## 2017-12-16 NOTE — Telephone Encounter (Signed)
I advised patient she could take medication with or without food.

## 2017-12-16 NOTE — Telephone Encounter (Signed)
Receive vital sign alert, BP was 150/100 on 12/03/2017, 2 weeks ago. We attempted to call and check on the patient 12/14/2017: No answer.

## 2017-12-16 NOTE — Telephone Encounter (Signed)
Pt called and asked if she needs to eat when taking her new medication that Dr Loanne Drilling prescribed. Please give her a call back regarding this thanks.

## 2017-12-17 DIAGNOSIS — I5031 Acute diastolic (congestive) heart failure: Secondary | ICD-10-CM | POA: Diagnosis not present

## 2017-12-17 DIAGNOSIS — J45909 Unspecified asthma, uncomplicated: Secondary | ICD-10-CM | POA: Diagnosis not present

## 2017-12-17 DIAGNOSIS — E039 Hypothyroidism, unspecified: Secondary | ICD-10-CM | POA: Diagnosis not present

## 2017-12-17 DIAGNOSIS — I130 Hypertensive heart and chronic kidney disease with heart failure and stage 1 through stage 4 chronic kidney disease, or unspecified chronic kidney disease: Secondary | ICD-10-CM | POA: Diagnosis not present

## 2017-12-17 DIAGNOSIS — E1122 Type 2 diabetes mellitus with diabetic chronic kidney disease: Secondary | ICD-10-CM | POA: Diagnosis not present

## 2017-12-17 DIAGNOSIS — N183 Chronic kidney disease, stage 3 (moderate): Secondary | ICD-10-CM | POA: Diagnosis not present

## 2017-12-17 DIAGNOSIS — Z7951 Long term (current) use of inhaled steroids: Secondary | ICD-10-CM | POA: Diagnosis not present

## 2017-12-17 DIAGNOSIS — Z7982 Long term (current) use of aspirin: Secondary | ICD-10-CM | POA: Diagnosis not present

## 2017-12-17 DIAGNOSIS — K219 Gastro-esophageal reflux disease without esophagitis: Secondary | ICD-10-CM | POA: Diagnosis not present

## 2017-12-17 DIAGNOSIS — Z9981 Dependence on supplemental oxygen: Secondary | ICD-10-CM | POA: Diagnosis not present

## 2017-12-17 DIAGNOSIS — J9621 Acute and chronic respiratory failure with hypoxia: Secondary | ICD-10-CM | POA: Diagnosis not present

## 2017-12-17 DIAGNOSIS — E1149 Type 2 diabetes mellitus with other diabetic neurological complication: Secondary | ICD-10-CM | POA: Diagnosis not present

## 2017-12-17 DIAGNOSIS — Z794 Long term (current) use of insulin: Secondary | ICD-10-CM | POA: Diagnosis not present

## 2017-12-17 DIAGNOSIS — Z96651 Presence of right artificial knee joint: Secondary | ICD-10-CM | POA: Diagnosis not present

## 2017-12-17 DIAGNOSIS — Z96652 Presence of left artificial knee joint: Secondary | ICD-10-CM | POA: Diagnosis not present

## 2017-12-17 DIAGNOSIS — R000 Tachycardia, unspecified: Secondary | ICD-10-CM | POA: Diagnosis not present

## 2017-12-18 ENCOUNTER — Encounter: Payer: Self-pay | Admitting: Physician Assistant

## 2017-12-18 ENCOUNTER — Ambulatory Visit (INDEPENDENT_AMBULATORY_CARE_PROVIDER_SITE_OTHER): Payer: Medicare Other | Admitting: Physician Assistant

## 2017-12-18 VITALS — BP 144/90 | HR 95 | Ht 69.0 in | Wt 244.0 lb

## 2017-12-18 DIAGNOSIS — R079 Chest pain, unspecified: Secondary | ICD-10-CM

## 2017-12-18 DIAGNOSIS — E785 Hyperlipidemia, unspecified: Secondary | ICD-10-CM | POA: Diagnosis not present

## 2017-12-18 DIAGNOSIS — I509 Heart failure, unspecified: Secondary | ICD-10-CM | POA: Diagnosis not present

## 2017-12-18 DIAGNOSIS — I10 Essential (primary) hypertension: Secondary | ICD-10-CM | POA: Diagnosis not present

## 2017-12-18 DIAGNOSIS — E119 Type 2 diabetes mellitus without complications: Secondary | ICD-10-CM

## 2017-12-18 DIAGNOSIS — M6281 Muscle weakness (generalized): Secondary | ICD-10-CM | POA: Diagnosis not present

## 2017-12-18 DIAGNOSIS — J9611 Chronic respiratory failure with hypoxia: Secondary | ICD-10-CM

## 2017-12-18 DIAGNOSIS — I3139 Other pericardial effusion (noninflammatory): Secondary | ICD-10-CM

## 2017-12-18 DIAGNOSIS — E039 Hypothyroidism, unspecified: Secondary | ICD-10-CM

## 2017-12-18 DIAGNOSIS — I313 Pericardial effusion (noninflammatory): Secondary | ICD-10-CM | POA: Diagnosis not present

## 2017-12-18 DIAGNOSIS — I2510 Atherosclerotic heart disease of native coronary artery without angina pectoris: Secondary | ICD-10-CM | POA: Diagnosis not present

## 2017-12-18 MED ORDER — METOPROLOL TARTRATE 25 MG PO TABS: 37.5000 mg | ORAL_TABLET | Freq: Two times a day (BID) | ORAL | 1 refills | Status: DC

## 2017-12-18 NOTE — Patient Instructions (Signed)
Medication Instructions: Increase Metoprolol to 37.5 mg (1 and 1/2 tab) twice daily.   Testing/Procedures: Your physician has requested that you have a limited echocardiogram. Echocardiography is a painless test that uses sound waves to create images of your heart. It provides your doctor with information about the size and shape of your heart and how well your heart's chambers and valves are working. This procedure takes approximately one hour. There are no restrictions for this procedure.  Follow-Up: Your physician recommends that you schedule a follow-up appointment in: 2 months with Dr. Radford Pax.  If you need a refill on your cardiac medications before your next appointment, please call your pharmacy.

## 2017-12-18 NOTE — Progress Notes (Signed)
Cardiology Office Note    Date:  12/18/2017   ID:  [name], DOB July 03, 1950, MRN [id number]  PCP:  Colon Branch, MD  Cardiologist:  Dr. Radford Pax   Chief Complaint  Patient presents with  . Follow-up    seen for Dr. Radford Pax. Recent pericarditis    History of Present Illness:  [name] is a 67 y.o. female with PMH of HLD, HTN, DM II, OSA on CPAP, hypothyroidism and obesity.  She had a previous Myoview obtained on 09/10/2016 that showed no ST segment deviation, EF 45% with normal perfusion.  Follow-up echocardiogram obtained on 09/09/2016 showed EF 55 to 60%, no wall motion abnormality.  She uses 3 L nasal cannula oxygen at home.    She was admitted on 10/19/2017 with acute onset of shortness of breath.  CTA of the chest revealed multifocal groundglass opacity with differentials include pulmonary edema versus infectious versus inflammatory etiology.  There was also small to moderate pericardial effusion measuring up to 2 cm on the CTA.  Her TSH was high, Synthroid was increased.  She was also severely hypoglycemic and was given D50.  Cardiology service was consulted for pericardial effusion.  Echocardiogram obtained on 10/20/2017 showed EF 55 to 60%, mild LVH, mild pericardial effusion.  She eventually underwent cardiac catheterization on 10/22/2017 which showed normal coronaries, EF 55 to 65%.  Due to concern of her symptoms related to pericarditis, patient was started on Motrin 600 mg 3 times daily for 2 weeks and colchicine 0.6 mg twice daily for 67-month along with PPI GI prophylaxis.  It was also recommended for her to follow-up with rheumatology for further evaluation.  Due to low LVEDP on cath, it was recommended that she stop the Lasix and potassium supplement.    Since discharge from the hospital, she was readmitted on 11/12/2017 with shortness of breath.  She also had recurrent chest discomfort.  Due to the negative cardiac catheterization, no further ischemic work-up was done. 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  Pulmonology service recommended discontinuation of diltiazem as positive causative agent and a repeat chest CT in 8 weeks.  Unfortunately, patient was readmitted on 7/9 with generalized weakness and tachycardia.  He was found to have a urinary tract infection and diarrhea.  C. difficile was negative.  She was ruled out of PE through VQ scan.  However on 7/11, she had unresponsive episode.  Code stroke was activated and the patient was evaluated by neurology service.  EEG was unremarkable.  MRI of the brain was negative.  She did develop acute renal failure, this was felt to be prerenal.  Renal ultrasound was normal.  Her Micardis was stopped.  She was eventually discharged home.  She returned back to the ED on 7/25 with altered mental status and hypoglycemia.  Her CBG was 20.  After 2 A of D50, she had improvement and feels she is back to baseline.  Patient presents today for cardiology office visit.  She continued to have a complaint of some discomfort under the left breast.  This is worse when she is laying down on her back and take a deep breath.  I think this is more likely to be a musculoskeletal issue.  She is still on 0.6 mg twice daily of colchicine.  Her shortness of breath has not changed and that this seems to be her baseline for the past year.  I will obtain a  limited echocardiogram to look at the pericardial effusion.  Otherwise her blood pressure is mildly elevated today and so is her heart rate, I will increase her metoprolol to 37.5 mg twice daily.  She does not have any lower extremity edema, orthopnea or PND.  Her chronic respiratory failure is unchanged.  She can follow-up with Dr. Radford Pax in 47-month.   Past Medical History:  Diagnosis Date  . Anterolisthesis    Grade 1, L4-5  . Bronchospasm 05/28/2012  . CHEST PAIN 11/18/2007  . DEGENERATIVE JOINT DISEASE 10/06/2006  .  DEPRESSION 09/26/2008  . DIABETES MELLITUS 10/06/2006  . Diverticulosis    5366,4403  . GERD (gastroesophageal reflux disease) 07/25/2013  . HYPERLIPIDEMIA 01/11/2009  . HYPERTENSION 10/06/2006  . INSOMNIA 09/26/2008  . Internal hemorrhoids   . OBSTRUCTIVE SLEEP APNEA 06/23/2008   Severe OSA per sleep study 2010, Rx a CPAP  . Pain in joint, multiple sites 11/10/2006  . UNSPECIFIED ANEMIA 12/10/2009  . UTI (urinary tract infection) 11/2017    Past Surgical History:  Procedure Laterality Date  . COLONOSCOPY  08/01/2011   Procedure: COLONOSCOPY;  Surgeon: Inda Castle, MD;  Location: [facility];  Service: Endoscopy;  Laterality: N/A;  . LEFT HEART CATH AND CORONARY ANGIOGRAPHY N/A 10/22/2017   Procedure: LEFT HEART CATH AND CORONARY ANGIOGRAPHY;  Surgeon: Jettie Booze, MD;  Location: [facility];  Service: Cardiovascular;  Laterality: N/A;  . 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 Right knee replacement  2005    Current Medications: Outpatient Medications Prior to Visit  Medication Sig Dispense Refill  . ACCU-CHEK AVIVA PLUS test strip TEST BLOD SUGAR TWICE DAILY AND LANCETS TWICE DAILY 200 each 0  . ACCU-CHEK SOFTCLIX LANCETS lancets USE TO CHECK BLOOD SUGAR TWICE A DAY 100 each 0  . acetaminophen (TYLENOL) 325 MG tablet Take 2 tablets (650 mg total) by mouth every 6 (six) hours as needed for mild pain (or Fever >/= 101). 20 tablet 1  . albuterol (PROVENTIL HFA;VENTOLIN HFA) 108 (90 Base) MCG/ACT inhaler Inhale 2 puffs into the lungs every 4 (four) hours as needed for wheezing. 1 Inhaler 6  . aspirin EC 81 MG tablet Take 1 tablet (81 mg total) by mouth daily. With food 30 tablet 5  . budesonide-formoterol (SYMBICORT) 160-4.5 MCG/ACT inhaler Inhale 2 puffs into the lungs 2 (two) times daily. 10.2 g 5  . colchicine 0.6 MG tablet Take 1 tablet (0.6 mg total) by mouth 2 (two) times daily. 60 tablet 5  . latanoprost (XALATAN) 0.005 % ophthalmic solution USE 1 DROP IN BOTH EYES AT BEDTIME  2.5 mL 10  . levothyroxine (SYNTHROID, LEVOTHROID) 75 MCG tablet Take 1 tablet (75 mcg total) by mouth daily before breakfast. 30 tablet 5  . OXYGEN Inhale 3 L into the lungs continuous.     . pantoprazole (PROTONIX) 40 MG tablet Take 1 tablet (40 mg total) by mouth daily before breakfast. 90 tablet 0  . potassium chloride (K-DUR,KLOR-CON) 10 MEQ tablet Take 1 tablet (10 mEq total) by mouth daily. 30 tablet 5  . rosuvastatin (CRESTOR) 40 MG tablet Take 1 tablet (40 mg total) by mouth daily. (Patient taking differently: Take 40 mg by mouth every evening. ) 30 tablet 5  . sitaGLIPtin (JANUVIA) 50 MG tablet Take 1 tablet (50 mg total) by mouth daily. 30 tablet 11  . metoprolol tartrate (LOPRESSOR) 25 MG tablet Take 1 tablet (25 mg total) by mouth 2 (two) times daily. 60 tablet 5   No  facility-administered medications prior to visit.      Allergies:   Patient has no known allergies.   Social History   Socioeconomic History  . Marital status: Widowed    Spouse name: Not on file  . Number of children: 4  . Years of education: Not on file  . Highest education level: Not on file  Occupational History  . Occupation: disability    Employer: UNEMPLOYED  Social Needs  . Financial resource strain: Not on file  . Food insecurity:    Worry: Not on file    Inability: Not on file  . Transportation needs:    Medical: Not on file    Non-medical: Not on file  Tobacco Use  . Smoking status: Former Smoker    Packs/day: 0.20    Years: 4.00    Pack years: 0.80    Types: Cigarettes    Last attempt to quit: 05/13/1995    Years since quitting: 22.6  . Smokeless tobacco: Never Used  Substance and Sexual Activity  . Alcohol use: Not Currently  . Drug use: No  . Sexual activity: Not Currently  Lifestyle  . Physical activity:    Days per week: Not on file    Minutes per session: Not on file  . Stress: Not on file  Relationships  . Social connections:    Talks on phone: Not on file    Gets  together: Not on file    Attends religious service: Not on file    Active member of club or organization: Not on file    Attends meetings of clubs or organizations: Not on file    Relationship status: Not on file  Other Topics Concern  . Not on file  Social History Narrative   Widow , lives by herself   Lost a son, 3 living    Lost husband     Family History:  The patient's family history includes Asthma in her mother; Diabetes in her other; Hypertension in her sister; Pancreatic cancer in her brother; Stroke in her mother.   ROS:   Please see the history of present illness.    ROS All other systems reviewed and are negative.   PHYSICAL EXAM:   VS:  BP (!) 144/90   Pulse 95   Ht 5\' 9"  (1.753 m)   Wt 244 lb (110.7 kg)   SpO2 93%   BMI 36.03 kg/m    GEN: Well nourished, well developed, in no acute distress  HEENT: normal  Neck: no JVD, carotid bruits, or masses Cardiac: RRR; no murmurs, rubs, or gallops,no edema  Respiratory:  clear to auscultation bilaterally, normal work of breathing GI: soft, nontender, nondistended, + BS MS: no deformity or atrophy  Skin: warm and dry, no rash Neuro:  Alert and Oriented x 3, Strength and sensation are intact Psych: euthymic mood, full affect  Wt Readings from Last 3 Encounters:  12/18/17 244 lb (110.7 kg)  12/07/17 244 lb 3.2 oz (110.8 kg)  12/04/17 254 lb (115.2 kg)      Studies/Labs Reviewed:   EKG:  EKG is not ordered today.    Recent Labs: 10/20/2017: Magnesium 1.9 11/17/2017: B Natriuretic Peptide 149.3 11/19/2017: TSH 6.755 12/03/2017: Platelets 325 12/07/2017: ALT 34; BUN 10; Creatinine, Ser 1.55; Hemoglobin 11.5; Potassium 3.7; Sodium 140   Lipid Panel    Component Value Date/Time   CHOL 94 11/18/2017 0012   TRIG 110 11/18/2017 0012   HDL 30 (L) 11/18/2017 0012   CHOLHDL 3.1  11/18/2017 0012   VLDL 22 11/18/2017 0012   LDLCALC 42 11/18/2017 0012    Additional studies/ records that were reviewed today include:    Echo 10/20/2017 LV EF: 55% -   60% Study Conclusions  - Left ventricle: The cavity size was normal. There was mild   concentric hypertrophy. Systolic function was normal. The   estimated ejection fraction was in the range of 55% to 60%. Wall   motion was normal; there were no regional wall motion   abnormalities. Left ventricular diastolic function parameters   were normal. - Aortic valve: Trileaflet; mildly thickened, mildly calcified   leaflets. - Mitral valve: Calcified annulus. Mildly thickened leaflets .   There was trivial regurgitation. - Right ventricle: The cavity size was mildly dilated. Wall   thickness was normal. Systolic function was mildly reduced. - Right atrium: The atrium was normal in size. - Pulmonic valve: There was mild regurgitation. - Pulmonary arteries: Systolic pressure was at the upper limits of   normal. - Inferior vena cava: The vessel was dilated. The respirophasic   diameter changes were blunted (< 50%), consistent with elevated   central venous pressure. - Pericardium, extracardiac: There was mild pericardial effusion   predominantly around the inferolateral wall.  Impressions:  - There was mild pericardial effusion predominantly around the   inferolateral wall. No evidence for hemodynamic compromise.   Cath 10/22/2017  The left ventricular systolic function is normal.  LV end diastolic pressure is low.  The left ventricular ejection fraction is 55-65% by visual estimate.  There is no aortic valve stenosis.  Small pericardial effusion.   No CAD.  Chest pain is not ischemic in nature.   ASSESSMENT:    1. Pericardial effusion   2. Chest pain, unspecified type   3. Hyperlipidemia, unspecified hyperlipidemia type   4. Essential hypertension   5. Controlled type 2 diabetes mellitus without complication, without long-term current use of insulin ([facility])   6. Hypothyroidism, unspecified type   7. Chronic respiratory failure with  hypoxia ([facility])      PLAN:  In order of problems listed above:  1. Pericardial effusion: Previous echocardiogram in June 2019 identified small amount of pericardial effusion.  She was treated for pericarditis.  She has been on colchicine 0.6 mg twice daily.  Will complete the 56-month course around mid September.  I recommended a limited echo to evaluate the degree of pericardial effusion at this time.  She is always short of breath, and appears to be a very poor historian, so it is unclear if her shortness of breath is getting worse based on her history.  2. Chest pain: Although chest pain could be related to pericarditis, however it appears to have a component of musculoskeletal issue.  She has a focal area of chest pain under her left breast that is tender upon palpation.  She says she avoid laying on that side.  Recent cardiac catheterization was negative for any coronary artery disease  3. Hypertension: Blood pressure mildly elevated today, will increase metoprolol to 37.5 mg twice daily 4. Hyperlipidemia: On Crestor 40 mg daily  5. DM2: Managed by primary care provider.  There has been multiple incidents of hypoglycemic episodes recently.  6. Hypothyroidism: Her thyroid medication was increased back in June.  Will defer to primary care provider  7. Chronic respiratory failure: On 3 L home oxygen 24/7.  She is chronically short of breath.    Medication Adjustments/Labs and Tests Ordered: Current medicines are reviewed at length  with the patient today.  Concerns regarding medicines are outlined above.  Medication changes, Labs and Tests ordered today are listed in the Patient Instructions below. Patient Instructions  Medication Instructions: Increase Metoprolol to 37.5 mg (1 and 1/2 tab) twice daily.   Testing/Procedures: Your physician has requested that you have a limited echocardiogram. Echocardiography is a painless test that uses sound waves to create images of your heart. It  provides your doctor with information about the size and shape of your heart and how well your heart's chambers and valves are working. This procedure takes approximately one hour. There are no restrictions for this procedure.  Follow-Up: Your physician recommends that you schedule a follow-up appointment in: 2 months with Dr. Radford Pax.  If you need a refill on your cardiac medications before your next appointment, please call your pharmacy.     Hilbert Corrigan, [place]  12/18/2017 4:26 PM    [facility] [address], [place], [place]  [address] Phone: [phone]; Fax: [phone]

## 2017-12-21 ENCOUNTER — Other Ambulatory Visit: Payer: Self-pay

## 2017-12-21 ENCOUNTER — Encounter (HOSPITAL_COMMUNITY): Payer: Self-pay | Admitting: Emergency Medicine

## 2017-12-21 ENCOUNTER — Observation Stay (HOSPITAL_COMMUNITY)
Admission: EM | Admit: 2017-12-21 | Discharge: 2017-12-23 | Disposition: A | Discharge status: Home / Self Care | Payer: Medicare Other | Attending: Internal Medicine | Admitting: Internal Medicine

## 2017-12-21 ENCOUNTER — Emergency Department (HOSPITAL_COMMUNITY): Payer: Medicare Other

## 2017-12-21 DIAGNOSIS — Z79899 Other long term (current) drug therapy: Secondary | ICD-10-CM | POA: Insufficient documentation

## 2017-12-21 DIAGNOSIS — Z7984 Long term (current) use of oral hypoglycemic drugs: Secondary | ICD-10-CM | POA: Insufficient documentation

## 2017-12-21 DIAGNOSIS — I5031 Acute diastolic (congestive) heart failure: Secondary | ICD-10-CM | POA: Diagnosis not present

## 2017-12-21 DIAGNOSIS — R05 Cough: Secondary | ICD-10-CM | POA: Diagnosis not present

## 2017-12-21 DIAGNOSIS — Z6833 Body mass index (BMI) 33.0-33.9, adult: Secondary | ICD-10-CM | POA: Insufficient documentation

## 2017-12-21 DIAGNOSIS — I10 Essential (primary) hypertension: Secondary | ICD-10-CM | POA: Diagnosis present

## 2017-12-21 DIAGNOSIS — R079 Chest pain, unspecified: Secondary | ICD-10-CM | POA: Diagnosis not present

## 2017-12-21 DIAGNOSIS — R0789 Other chest pain: Secondary | ICD-10-CM | POA: Diagnosis not present

## 2017-12-21 DIAGNOSIS — E1122 Type 2 diabetes mellitus with diabetic chronic kidney disease: Secondary | ICD-10-CM | POA: Diagnosis not present

## 2017-12-21 DIAGNOSIS — N179 Acute kidney failure, unspecified: Secondary | ICD-10-CM | POA: Diagnosis present

## 2017-12-21 DIAGNOSIS — F419 Anxiety disorder, unspecified: Secondary | ICD-10-CM | POA: Diagnosis not present

## 2017-12-21 DIAGNOSIS — R7989 Other specified abnormal findings of blood chemistry: Secondary | ICD-10-CM | POA: Diagnosis not present

## 2017-12-21 DIAGNOSIS — I130 Hypertensive heart and chronic kidney disease with heart failure and stage 1 through stage 4 chronic kidney disease, or unspecified chronic kidney disease: Secondary | ICD-10-CM | POA: Diagnosis not present

## 2017-12-21 DIAGNOSIS — Z9981 Dependence on supplemental oxygen: Secondary | ICD-10-CM | POA: Diagnosis not present

## 2017-12-21 DIAGNOSIS — J849 Interstitial pulmonary disease, unspecified: Secondary | ICD-10-CM | POA: Insufficient documentation

## 2017-12-21 DIAGNOSIS — J449 Chronic obstructive pulmonary disease, unspecified: Secondary | ICD-10-CM | POA: Insufficient documentation

## 2017-12-21 DIAGNOSIS — Z87891 Personal history of nicotine dependence: Secondary | ICD-10-CM | POA: Diagnosis not present

## 2017-12-21 DIAGNOSIS — E039 Hypothyroidism, unspecified: Secondary | ICD-10-CM | POA: Diagnosis not present

## 2017-12-21 DIAGNOSIS — E119 Type 2 diabetes mellitus without complications: Secondary | ICD-10-CM

## 2017-12-21 DIAGNOSIS — F32A Depression, unspecified: Secondary | ICD-10-CM

## 2017-12-21 DIAGNOSIS — Z7982 Long term (current) use of aspirin: Secondary | ICD-10-CM | POA: Diagnosis not present

## 2017-12-21 DIAGNOSIS — Z79890 Hormone replacement therapy (postmenopausal): Secondary | ICD-10-CM | POA: Diagnosis not present

## 2017-12-21 DIAGNOSIS — R778 Other specified abnormalities of plasma proteins: Secondary | ICD-10-CM | POA: Diagnosis present

## 2017-12-21 DIAGNOSIS — F329 Major depressive disorder, single episode, unspecified: Secondary | ICD-10-CM | POA: Diagnosis not present

## 2017-12-21 DIAGNOSIS — N183 Chronic kidney disease, stage 3 (moderate): Secondary | ICD-10-CM | POA: Insufficient documentation

## 2017-12-21 DIAGNOSIS — D649 Anemia, unspecified: Secondary | ICD-10-CM | POA: Insufficient documentation

## 2017-12-21 DIAGNOSIS — J810 Acute pulmonary edema: Secondary | ICD-10-CM | POA: Diagnosis not present

## 2017-12-21 DIAGNOSIS — Z96653 Presence of artificial knee joint, bilateral: Secondary | ICD-10-CM | POA: Insufficient documentation

## 2017-12-21 DIAGNOSIS — I509 Heart failure, unspecified: Secondary | ICD-10-CM

## 2017-12-21 DIAGNOSIS — E1149 Type 2 diabetes mellitus with other diabetic neurological complication: Secondary | ICD-10-CM | POA: Diagnosis not present

## 2017-12-21 DIAGNOSIS — G4733 Obstructive sleep apnea (adult) (pediatric): Secondary | ICD-10-CM | POA: Diagnosis not present

## 2017-12-21 DIAGNOSIS — R112 Nausea with vomiting, unspecified: Secondary | ICD-10-CM | POA: Diagnosis not present

## 2017-12-21 DIAGNOSIS — R0602 Shortness of breath: Secondary | ICD-10-CM | POA: Diagnosis not present

## 2017-12-21 DIAGNOSIS — K219 Gastro-esophageal reflux disease without esophagitis: Secondary | ICD-10-CM | POA: Diagnosis not present

## 2017-12-21 DIAGNOSIS — R0902 Hypoxemia: Secondary | ICD-10-CM | POA: Diagnosis not present

## 2017-12-21 LAB — I-STAT TROPONIN, ED: Troponin i, poc: 0.05 ng/mL (ref 0.00–0.08)

## 2017-12-21 LAB — CBC
HEMATOCRIT: 35.9 % — AB (ref 36.0–46.0)
HEMOGLOBIN: 11.3 g/dL — AB (ref 12.0–15.0)
MCH: 24.1 pg — ABNORMAL LOW (ref 26.0–34.0)
MCHC: 31.5 g/dL (ref 30.0–36.0)
MCV: 76.5 fL — AB (ref 78.0–100.0)
Platelets: 289 10*3/uL (ref 150–400)
RBC: 4.69 MIL/uL (ref 3.87–5.11)
RDW: 22.1 % — ABNORMAL HIGH (ref 11.5–15.5)
WBC: 3.8 10*3/uL — AB (ref 4.0–10.5)

## 2017-12-21 LAB — COMPREHENSIVE METABOLIC PANEL
ALBUMIN: 3 g/dL — AB (ref 3.5–5.0)
ALT: 34 U/L (ref 0–44)
AST: 58 U/L — AB (ref 15–41)
Alkaline Phosphatase: 61 U/L (ref 38–126)
Anion gap: 10 (ref 5–15)
BUN: 7 mg/dL — AB (ref 8–23)
CHLORIDE: 105 mmol/L (ref 98–111)
CO2: 24 mmol/L (ref 22–32)
CREATININE: 1.45 mg/dL — AB (ref 0.44–1.00)
Calcium: 8.7 mg/dL — ABNORMAL LOW (ref 8.9–10.3)
GFR calc non Af Amer: 36 mL/min — ABNORMAL LOW (ref >60)
GFR, EST AFRICAN AMERICAN: 42 mL/min — AB (ref >60)
GLUCOSE: 84 mg/dL (ref 70–99)
Potassium: 3.3 mmol/L — ABNORMAL LOW (ref 3.5–5.1)
SODIUM: 139 mmol/L (ref 135–145)
Total Bilirubin: 0.8 mg/dL (ref 0.3–1.2)
Total Protein: 7.7 g/dL (ref 6.5–8.1)

## 2017-12-21 LAB — LIPASE, BLOOD: LIPASE: 51 U/L (ref 11–51)

## 2017-12-21 NOTE — ED Triage Notes (Addendum)
Per EMS, pt from home. Pt reports sharp cp that started last night. Pain changes upon palpation. 324 asa given by ems and ems 12 lead unremarkable.  Pt reports abd pain and cough as well. Pt unable to keep foods down, vomited 3x today. EMS VSS. Pt wears 3L [place] at home baseline. Pt upset she missed neurology appointment today d/t transportation issues.

## 2017-12-22 ENCOUNTER — Encounter (HOSPITAL_COMMUNITY): Payer: Self-pay | Admitting: Internal Medicine

## 2017-12-22 ENCOUNTER — Emergency Department (HOSPITAL_COMMUNITY): Payer: Medicare Other

## 2017-12-22 ENCOUNTER — Observation Stay (HOSPITAL_BASED_OUTPATIENT_CLINIC_OR_DEPARTMENT_OTHER): Payer: Medicare Other

## 2017-12-22 ENCOUNTER — Other Ambulatory Visit: Payer: Self-pay

## 2017-12-22 DIAGNOSIS — I313 Pericardial effusion (noninflammatory): Secondary | ICD-10-CM | POA: Diagnosis not present

## 2017-12-22 DIAGNOSIS — R0789 Other chest pain: Secondary | ICD-10-CM | POA: Diagnosis not present

## 2017-12-22 DIAGNOSIS — E039 Hypothyroidism, unspecified: Secondary | ICD-10-CM | POA: Diagnosis not present

## 2017-12-22 DIAGNOSIS — R748 Abnormal levels of other serum enzymes: Secondary | ICD-10-CM | POA: Diagnosis not present

## 2017-12-22 DIAGNOSIS — R072 Precordial pain: Secondary | ICD-10-CM

## 2017-12-22 DIAGNOSIS — F22 Delusional disorders: Secondary | ICD-10-CM

## 2017-12-22 DIAGNOSIS — R0602 Shortness of breath: Secondary | ICD-10-CM | POA: Diagnosis not present

## 2017-12-22 DIAGNOSIS — I509 Heart failure, unspecified: Secondary | ICD-10-CM

## 2017-12-22 LAB — TROPONIN I
TROPONIN I: 0.04 ng/mL — AB (ref <0.03)
TROPONIN I: 0.04 ng/mL — AB (ref <0.03)
Troponin I: 0.04 ng/mL (ref <0.03)

## 2017-12-22 LAB — MRSA PCR SCREENING: MRSA by PCR: NEGATIVE

## 2017-12-22 LAB — GLUCOSE, CAPILLARY
GLUCOSE-CAPILLARY: 80 mg/dL (ref 70–99)
Glucose-Capillary: 75 mg/dL (ref 70–99)
Glucose-Capillary: 100 mg/dL — ABNORMAL HIGH (ref 70–99)

## 2017-12-22 LAB — T4, FREE: Free T4: 1.19 ng/dL (ref 0.82–1.77)

## 2017-12-22 LAB — ECHOCARDIOGRAM LIMITED
Height: 69 in
Weight: 3904 oz

## 2017-12-22 LAB — CBG MONITORING, ED: Glucose-Capillary: 76 mg/dL (ref 70–99)

## 2017-12-22 LAB — TSH: TSH: 11.593 u[IU]/mL — ABNORMAL HIGH (ref 0.350–4.500)

## 2017-12-22 LAB — BRAIN NATRIURETIC PEPTIDE: B NATRIURETIC PEPTIDE 5: 406.6 pg/mL — AB (ref 0.0–100.0)

## 2017-12-22 MED ORDER — METOPROLOL TARTRATE 25 MG PO TABS
37.5000 mg | ORAL_TABLET | Freq: Two times a day (BID) | ORAL | Status: DC
  Administered 2017-12-22 – 2017-12-23 (×3): 37.5 mg via ORAL
  Filled 2017-12-22 (×4): qty 1

## 2017-12-22 MED ORDER — FUROSEMIDE 10 MG/ML IJ SOLN
40.0000 mg | Freq: Two times a day (BID) | INTRAMUSCULAR | Status: DC
  Administered 2017-12-22 – 2017-12-23 (×2): 40 mg via INTRAVENOUS
  Filled 2017-12-22 (×2): qty 4

## 2017-12-22 MED ORDER — MORPHINE SULFATE (PF) 2 MG/ML IV SOLN: 2.0000 mg | INTRAVENOUS | Status: DC | PRN

## 2017-12-22 MED ORDER — IOPAMIDOL (ISOVUE-370) INJECTION 76%
100.0000 mL | Freq: Once | INTRAVENOUS | Status: AC | PRN
  Administered 2017-12-22: 100 mL via INTRAVENOUS

## 2017-12-22 MED ORDER — GI COCKTAIL ~~LOC~~
30.0000 mL | Freq: Once | ORAL | Status: AC
  Administered 2017-12-22: 30 mL via ORAL
  Filled 2017-12-22: qty 30

## 2017-12-22 MED ORDER — ROSUVASTATIN CALCIUM 20 MG PO TABS
40.0000 mg | ORAL_TABLET | Freq: Every evening | ORAL | Status: DC
  Administered 2017-12-22: 40 mg via ORAL
  Filled 2017-12-22: qty 2

## 2017-12-22 MED ORDER — COLCHICINE 0.6 MG PO TABS
0.6000 mg | ORAL_TABLET | Freq: Two times a day (BID) | ORAL | Status: DC
  Administered 2017-12-22 – 2017-12-23 (×3): 0.6 mg via ORAL
  Filled 2017-12-22 (×3): qty 1

## 2017-12-22 MED ORDER — LEVOTHYROXINE SODIUM 75 MCG PO TABS
75.0000 ug | ORAL_TABLET | Freq: Every day | ORAL | Status: DC
  Administered 2017-12-22 – 2017-12-23 (×2): 75 ug via ORAL
  Filled 2017-12-22 (×2): qty 1

## 2017-12-22 MED ORDER — PANTOPRAZOLE SODIUM 40 MG PO TBEC
40.0000 mg | DELAYED_RELEASE_TABLET | Freq: Every day | ORAL | Status: DC
  Administered 2017-12-22 – 2017-12-23 (×2): 40 mg via ORAL
  Filled 2017-12-22 (×2): qty 1

## 2017-12-22 MED ORDER — FUROSEMIDE 10 MG/ML IJ SOLN
40.0000 mg | Freq: Once | INTRAMUSCULAR | Status: AC
  Administered 2017-12-22: 40 mg via INTRAVENOUS
  Filled 2017-12-22: qty 4

## 2017-12-22 MED ORDER — ONDANSETRON HCL 4 MG/2ML IJ SOLN
4.0000 mg | Freq: Four times a day (QID) | INTRAMUSCULAR | Status: DC | PRN
  Administered 2017-12-23: 4 mg via INTRAVENOUS
  Filled 2017-12-22: qty 2

## 2017-12-22 MED ORDER — POTASSIUM CHLORIDE CRYS ER 10 MEQ PO TBCR
10.0000 meq | EXTENDED_RELEASE_TABLET | Freq: Every day | ORAL | Status: DC
  Administered 2017-12-22 – 2017-12-23 (×2): 10 meq via ORAL
  Filled 2017-12-22 (×2): qty 1

## 2017-12-22 MED ORDER — ONDANSETRON HCL 4 MG/2ML IJ SOLN
4.0000 mg | Freq: Once | INTRAMUSCULAR | Status: AC
  Administered 2017-12-22: 4 mg via INTRAVENOUS
  Filled 2017-12-22: qty 2

## 2017-12-22 MED ORDER — ACETAMINOPHEN 325 MG PO TABS: 650.0000 mg | ORAL_TABLET | Freq: Four times a day (QID) | ORAL | Status: DC | PRN

## 2017-12-22 MED ORDER — INSULIN ASPART 100 UNIT/ML ~~LOC~~ SOLN: 0.0000 [IU] | Freq: Every day | SUBCUTANEOUS | Status: DC

## 2017-12-22 MED ORDER — IOPAMIDOL (ISOVUE-370) INJECTION 76%
INTRAVENOUS | Status: AC
  Filled 2017-12-22: qty 100

## 2017-12-22 MED ORDER — ENOXAPARIN SODIUM 40 MG/0.4ML ~~LOC~~ SOLN
40.0000 mg | SUBCUTANEOUS | Status: DC
  Administered 2017-12-22 – 2017-12-23 (×2): 40 mg via SUBCUTANEOUS
  Filled 2017-12-22 (×2): qty 0.4

## 2017-12-22 MED ORDER — ALBUTEROL SULFATE (2.5 MG/3ML) 0.083% IN NEBU
5.0000 mg | INHALATION_SOLUTION | Freq: Once | RESPIRATORY_TRACT | Status: AC
  Administered 2017-12-22: 5 mg via RESPIRATORY_TRACT
  Filled 2017-12-22: qty 6

## 2017-12-22 MED ORDER — INSULIN ASPART 100 UNIT/ML ~~LOC~~ SOLN: 0.0000 [IU] | Freq: Three times a day (TID) | SUBCUTANEOUS | Status: DC

## 2017-12-22 MED ORDER — LATANOPROST 0.005 % OP SOLN
1.0000 [drp] | Freq: Every day | OPHTHALMIC | Status: DC
  Administered 2017-12-22: 1 [drp] via OPHTHALMIC
  Filled 2017-12-22: qty 2.5

## 2017-12-22 MED ORDER — ASPIRIN EC 81 MG PO TBEC
81.0000 mg | DELAYED_RELEASE_TABLET | Freq: Every day | ORAL | Status: DC
  Administered 2017-12-22 – 2017-12-23 (×2): 81 mg via ORAL
  Filled 2017-12-22 (×2): qty 1

## 2017-12-22 MED ORDER — GI COCKTAIL ~~LOC~~: 30.0000 mL | Freq: Four times a day (QID) | ORAL | Status: DC | PRN

## 2017-12-22 MED ORDER — IPRATROPIUM-ALBUTEROL 0.5-2.5 (3) MG/3ML IN SOLN
3.0000 mL | Freq: Four times a day (QID) | RESPIRATORY_TRACT | Status: DC
  Administered 2017-12-22 – 2017-12-23 (×5): 3 mL via RESPIRATORY_TRACT
  Filled 2017-12-22 (×5): qty 3

## 2017-12-22 NOTE — Progress Notes (Signed)
Pt declined use of CPAP for the night-no machine left in room.  Will follow progress and cont to offer CPAP.

## 2017-12-22 NOTE — ED Provider Notes (Signed)
[name] EMERGENCY DEPARTMENT Provider Note   CSN: [id number] Arrival date & time: 12/21/17  2117     History   Chief Complaint Chief Complaint  Patient presents with  . Chest Pain    HPI [name] is a 67 y.o. female.  The history is provided by the patient.  Chest Pain   This is a recurrent problem. The current episode started 12 to 24 hours ago. The problem occurs constantly. The problem has not changed since onset.The pain is associated with rest. The pain is present in the epigastric region. The pain is at a severity of 7/10. The pain is severe. The quality of the pain is described as sharp. The pain does not radiate. Associated symptoms include cough, nausea and vomiting. Pertinent negatives include no diaphoresis and no fever. She has tried nothing for the symptoms. The treatment provided no relief. Risk factors include obesity.  Pertinent negatives for past medical history include no aneurysm and no Marfan's syndrome.  Pertinent negatives for family medical history include: no Marfan's syndrome.  Procedure history is negative for EPS study.    Past Medical History:  Diagnosis Date  . Anterolisthesis    Grade 1, L4-5  . Bronchospasm 05/28/2012  . CHEST PAIN 11/18/2007  . DEGENERATIVE JOINT DISEASE 10/06/2006  . DEPRESSION 09/26/2008  . DIABETES MELLITUS 10/06/2006  . Diverticulosis    [id number]  . GERD (gastroesophageal reflux disease) 07/25/2013  . HYPERLIPIDEMIA 01/11/2009  . HYPERTENSION 10/06/2006  . INSOMNIA 09/26/2008  . Internal hemorrhoids   . OBSTRUCTIVE SLEEP APNEA 06/23/2008   Severe OSA per sleep study 2010, Rx a CPAP  . Pain in joint, multiple sites 11/10/2006  . UNSPECIFIED ANEMIA 12/10/2009  . UTI (urinary tract infection) 11/2017    Patient Active Problem List   Diagnosis Date Noted  . ILD (interstitial lung disease) ([place]) 11/25/2017  . Acute hypoxemic respiratory failure ([place]) 11/18/2017  . Tachycardia 11/17/2017  . ARF  (acute renal failure) ([place]) 11/17/2017  . Elevated troponin 11/17/2017  . Chronic respiratory failure with hypoxia ([place]) 11/12/2017  . Shortness of breath   . Pericardial effusion   . Pain 10/19/2017  . Paresthesia 10/19/2017  . Weakness 10/19/2017  . Neck pain 10/19/2017  . PCP NOTES >>> 02/23/2015  . Nocturnal oxygen desaturation 01/02/2015  . Morbid obesity ([place]) 01/02/2015  . Asthma, chronic 01/02/2015  . GERD (gastroesophageal reflux disease) 07/25/2013  . DOE (dyspnea on exertion) 04/25/2013  . 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 Bronchospasm 05/28/2012  . Internal hemorrhoids without mention of complication 37/34/2876  . Annual physical exam 06/06/2011  . Anemia 12/10/2009  . SKIN LESION 08/06/2009  . Hyperlipidemia 01/11/2009  . Depression 09/26/2008  . INSOMNIA 09/26/2008  . Obstructive sleep apnea 06/23/2008  . Chest pain 11/18/2007  . DM II (diabetes mellitus, type II), controlled ([place]) 10/06/2006  . Essential hypertension 10/06/2006  . Osteoarthritis 10/06/2006    Past Surgical History:  Procedure Laterality Date  . COLONOSCOPY  08/01/2011   Procedure: COLONOSCOPY;  Surgeon: Inda Castle, MD;  Location: [facility];  Service: Endoscopy;  Laterality: N/A;  . LEFT HEART CATH AND CORONARY ANGIOGRAPHY N/A 10/22/2017   Procedure: LEFT HEART CATH AND CORONARY ANGIOGRAPHY;  Surgeon: Jettie Booze, MD;  Location: [place] [facility];  Service: Cardiovascular;  Laterality: N/A;  . 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 Right knee replacement  2005     OB History   None  Home Medications    Prior to Admission medications   Medication Sig Start Date End Date Taking? Authorizing Provider  acetaminophen (TYLENOL) 325 MG tablet Take 2 tablets (650 mg total) by mouth every 6 (six) hours as needed for mild pain (or Fever >/= 101). 11/20/17  Yes Emokpae, Courage, MD  albuterol (PROVENTIL HFA;VENTOLIN HFA) 108 (90 Base)  MCG/ACT inhaler Inhale 2 puffs into the lungs every 4 (four) hours as needed for wheezing. 11/20/17 01/18/29 Yes Emokpae, Courage, MD  aspirin EC 81 MG tablet Take 1 tablet (81 mg total) by mouth daily. With food 11/20/17  Yes Emokpae, Courage, MD  budesonide-formoterol (SYMBICORT) 160-4.5 MCG/ACT inhaler Inhale 2 puffs into the lungs 2 (two) times daily. 11/20/17  Yes Emokpae, Courage, MD  colchicine 0.6 MG tablet Take 1 tablet (0.6 mg total) by mouth 2 (two) times daily. 11/20/17  Yes Emokpae, Courage, MD  latanoprost (XALATAN) 0.005 % ophthalmic solution USE 1 DROP IN BOTH EYES AT BEDTIME 11/20/17  Yes Emokpae, Courage, MD  levothyroxine (SYNTHROID, LEVOTHROID) 75 MCG tablet Take 1 tablet (75 mcg total) by mouth daily before breakfast. 11/20/17  Yes Emokpae, Courage, MD  metoprolol tartrate (LOPRESSOR) 25 MG tablet Take 1.5 tablets (37.5 mg total) by mouth 2 (two) times daily. 12/18/17  Yes Almyra Deforest, PA  pantoprazole (PROTONIX) 40 MG tablet Take 1 tablet (40 mg total) by mouth daily before breakfast. 11/02/17  Yes Paz, Alda Berthold, MD  potassium chloride (K-DUR,KLOR-CON) 10 MEQ tablet Take 1 tablet (10 mEq total) by mouth daily. 11/20/17  Yes Emokpae, Courage, MD  rosuvastatin (CRESTOR) 40 MG tablet Take 1 tablet (40 mg total) by mouth daily. Patient taking differently: Take 40 mg by mouth every evening.  11/20/17  Yes Emokpae, Courage, MD  sitaGLIPtin (JANUVIA) 50 MG tablet Take 1 tablet (50 mg total) by mouth daily. 12/04/17  Yes Renato Shin, MD  ACCU-CHEK AVIVA PLUS test strip TEST BLOD SUGAR TWICE DAILY AND LANCETS TWICE DAILY 10/15/17   Renato Shin, MD  ACCU-CHEK SOFTCLIX LANCETS lancets USE TO CHECK BLOOD SUGAR TWICE A DAY 10/05/17   Renato Shin, MD  OXYGEN Inhale 3 L into the lungs continuous.     [provider]    Family History Family History  Problem Relation Age of Onset  . Asthma Mother   . Stroke Mother   . Diabetes Other        M, B, S  . Hypertension Sister        M, S,B  .  Pancreatic cancer Brother   . Colon cancer Neg Hx   . Prostate cancer Neg Hx   . Breast cancer Neg Hx     Social History Social History   Tobacco Use  . Smoking status: Former Smoker    Packs/day: 0.20    Years: 4.00    Pack years: 0.80    Types: Cigarettes    Last attempt to quit: 05/13/1995    Years since quitting: 22.6  . Smokeless tobacco: Never Used  Substance Use Topics  . Alcohol use: Not Currently  . Drug use: No     Allergies   Patient has no known allergies.   Review of Systems Review of Systems  Constitutional: Negative for diaphoresis and fever.  Respiratory: Positive for cough.   Cardiovascular: Positive for chest pain.  Gastrointestinal: Positive for nausea and vomiting.  All other systems reviewed and are negative.    Physical Exam Updated Vital Signs BP (!) 144/85   Pulse 88  Temp 97.8 F (36.6 C) (Oral)   Resp 17   Ht 5\' 9"  (1.753 m)   Wt 110.7 kg   SpO2 96%   BMI 36.03 kg/m   Physical Exam  Constitutional: She is oriented to person, place, and time. She appears well-developed and well-nourished. No distress.  HENT:  Head: Normocephalic and atraumatic.  Mouth/Throat: No oropharyngeal exudate.  Eyes: Pupils are equal, round, and reactive to light. Conjunctivae are normal.  Neck: Normal range of motion. Neck supple.  Cardiovascular: Normal rate, regular rhythm, normal heart sounds and intact distal pulses.  Pulmonary/Chest: Effort normal. No stridor. Tachypnea noted. She has wheezes. She has no rales.  Abdominal: Soft. Bowel sounds are normal. She exhibits no mass. There is no tenderness. There is no rebound and no guarding.  Musculoskeletal: Normal range of motion.  Neurological: She is alert and oriented to person, place, and time. She displays normal reflexes.  Skin: Skin is warm and dry. Capillary refill takes less than 2 seconds.  Psychiatric: She has a normal mood and affect.  Nursing note and vitals reviewed.    ED Treatments /  Results  Labs (all labs ordered are listed, but only abnormal results are displayed) Results for orders placed or performed during the hospital encounter of 12/21/17  CBC  Result Value Ref Range   WBC 3.8 (L) 4.0 - 10.5 K/uL   RBC 4.69 3.87 - 5.11 MIL/uL   Hemoglobin 11.3 (L) 12.0 - 15.0 g/dL   HCT 35.9 (L) 36.0 - 46.0 %   MCV 76.5 (L) 78.0 - 100.0 fL   MCH 24.1 (L) 26.0 - 34.0 pg   MCHC 31.5 30.0 - 36.0 g/dL   RDW 22.1 (H) 11.5 - 15.5 %   Platelets 289 150 - 400 K/uL  Lipase, blood  Result Value Ref Range   Lipase 51 11 - 51 U/L  Comprehensive metabolic panel  Result Value Ref Range   Sodium 139 135 - 145 mmol/L   Potassium 3.3 (L) 3.5 - 5.1 mmol/L   Chloride 105 98 - 111 mmol/L   CO2 24 22 - 32 mmol/L   Glucose, Bld 84 70 - 99 mg/dL   BUN 7 (L) 8 - 23 mg/dL   Creatinine, Ser 1.45 (H) 0.44 - 1.00 mg/dL   Calcium 8.7 (L) 8.9 - 10.3 mg/dL   Total Protein 7.7 6.5 - 8.1 g/dL   Albumin 3.0 (L) 3.5 - 5.0 g/dL   AST 58 (H) 15 - 41 U/L   ALT 34 0 - 44 U/L   Alkaline Phosphatase 61 38 - 126 U/L   Total Bilirubin 0.8 0.3 - 1.2 mg/dL   GFR calc non Af Amer 36 (L) >60 mL/min   GFR calc Af Amer 42 (L) >60 mL/min   Anion gap 10 5 - 15  I-stat troponin, ED  Result Value Ref Range   Troponin i, poc 0.05 0.00 - 0.08 ng/mL   Comment 3           Dg Chest 2 View  Result Date: 12/21/2017 CLINICAL DATA:  Chest pain and vomiting EXAM: CHEST - 2 VIEW COMPARISON:  12/07/2017 CT of the chest FINDINGS: Cardiac shadow is mildly enlarged. Aortic calcifications are again seen. Patchy changes in the bases are again seen similar to that noted on prior CT examination. No new focal infiltrate or sizable effusion is seen. No bony abnormality is noted. IMPRESSION: Stable bibasilar changes consistent with the prior CT. Electronically Signed   By: Linus Mako.D.  On: 12/21/2017 22:14   Ct Chest High Resolution  Result Date: 12/07/2017 CLINICAL DATA:  67 year old female with history of shortness of  breath which is chronic but progressively worsening. EXAM: CT CHEST WITHOUT CONTRAST TECHNIQUE: Multidetector CT imaging of the chest was performed following the standard protocol without intravenous contrast. High resolution imaging of the lungs, as well as inspiratory and expiratory imaging, was performed. COMPARISON:  Chest CT 10/20/2017. FINDINGS: Cardiovascular: Heart size is normal. There is no significant pericardial fluid, thickening or pericardial calcification. There is aortic atherosclerosis, as well as atherosclerosis of the great vessels of the mediastinum and the coronary arteries, including calcified atherosclerotic plaque in the left main, left anterior descending and right coronary arteries. Calcifications of the aortic valve. Pulmonic trunk is mildly dilated measuring 3.7 cm in diameter. Mediastinum/Nodes: No pathologically enlarged mediastinal or hilar lymph nodes. Please note that accurate exclusion of hilar adenopathy is limited on noncontrast CT scans. Small hiatal hernia. No axillary lymphadenopathy. Asymmetric enlargement of the right lobe of the thyroid gland with a heterogeneous appearing nodule extending off the inferior aspect of the right lobe of the gland measuring 2.9 x 2.0 cm, similar to the prior study. Lungs/Pleura: High-resolution images demonstrate widespread areas of predominantly ground-glass attenuation which are most severe throughout the mid to lower lungs bilaterally. In the areas of greatest involvement there is also extensive septal thickening, thickening of the peribronchovascular interstitium and cylindrical as well as mild varicose bronchiectasis. No honeycombing is identified. Inspiratory and expiratory imaging is unremarkable. No acute consolidative airspace disease. No pleural effusions. No suspicious appearing pulmonary nodules or masses are noted. Upper Abdomen: Unremarkable. Musculoskeletal: There are no aggressive appearing lytic or blastic lesions noted in the  visualized portions of the skeleton. IMPRESSION: 1. 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 Although there is no definite honeycombing at this time, given the strong craniocaudal gradient and lack of air trapping, findings are strongly suspicious for UIP. Repeat high-resolution chest CT is suggested in 12 months to assess for temporal changes in the appearance of the lung parenchyma. 2. Mild dilatation of the pulmonic trunk (3.7 cm in diameter), which may suggest an associated pulmonary arterial hypertension. 3. Aortic atherosclerosis, in addition to left main and 2 vessel coronary artery disease. Please note that although the presence of coronary artery calcium documents the presence of coronary artery disease, the severity of this disease and any potential stenosis cannot be assessed on this non-gated CT examination. Assessment for potential risk factor modification, dietary therapy or pharmacologic therapy may be warranted, if clinically indicated. 4. There are calcifications of the aortic valve. Echocardiographic correlation for evaluation of potential valvular dysfunction may be warranted if clinically indicated. 5. Nodular enlargement of the inferior aspect of the right lobe of the thyroid gland which measures 2.9 x 2.0 cm. Further evaluation with nonemergent thyroid ultrasound is suggested in the near future to better evaluate this lesion and determine potential need for fine-needle aspiration. Aortic Atherosclerosis (ICD10-I70.0). Electronically Signed   By: Vinnie Langton M.D.   On: 12/07/2017 11:25   US Thyroid  Result Date: 12/11/2017 CLINICAL DATA:  67 year old female with a history of thyroid nodules EXAM: THYROID ULTRASOUND TECHNIQUE: Ultrasound examination of the thyroid gland and adjacent soft tissues was performed. COMPARISON:  None. FINDINGS: Parenchymal Echotexture: Moderately heterogenous Isthmus: 0.9 cm Right lobe: 5.1 cm x 1.9 cm x 2.3 cm Left lobe: 4.4 cm x 1.5 cm x 1.1 cm  _________________________________________________________ Estimated total number of nodules >/= 1 cm: 3 Number of  spongiform nodules >/=  2 cm not described below (TR1): 0 Number of mixed cystic and solid nodules >/= 1.5 cm not described below ([place]): 0 _________________________________________________________ Nodule # 1: Location: Isthmus; Superior Maximum size: 1.46 cm; Other 2 dimensions: 0.9 cm x 1.2 cm Composition: solid/almost completely solid (2) Echogenicity: hypoechoic (2) Shape: not taller-than-wide (0) Margins: ill-defined (0) Echogenic foci: none (0) ACR TI-RADS total points: 4. ACR TI-RADS risk category: TR4 (4-6 points). ACR TI-RADS recommendations: Nodule meets criteria for surveillance _________________________________________________________ Nodule # 2: Location: Right; Superior Maximum size: 1.6 cm; Other 2 dimensions: 0.7 cm x 1.5 cm Composition: mixed cystic and solid (1) Echogenicity: hypoechoic (2) Shape: not taller-than-wide (0) Margins: ill-defined (0) Echogenic foci: none (0) ACR TI-RADS total points: 3. ACR TI-RADS risk category: TR3 (3 points). ACR TI-RADS recommendations: Nodule meets criteria for surveillance _________________________________________________________ Nodule # 3: Location: Right; Inferior Maximum size: 2.2 cm; Other 2 dimensions: 2.0 cm x 1.8 cm Composition: solid/almost completely solid (2) Echogenicity: isoechoic (1) Shape: taller-than-wide (3) Margins: ill-defined (0) Echogenic foci: none (0) ACR TI-RADS total points: 6. ACR TI-RADS risk category: TR4 (4-6 points). ACR TI-RADS recommendations: Nodule meets criteria for biopsy _________________________________________________________ No adenopathy IMPRESSION: Right inferior thyroid nodule (labeled 3) meets criteria for biopsy, if indeed not a pseudo nodule, as designated by the newly established ACR TI-RADS criteria, and referral for biopsy is recommended. The isthmic and right superior thyroid nodules (labeled 1 and 2)  meet criteria for surveillance, as designated by the newly established ACR TI-RADS criteria. Surveillance ultrasound study recommended to be performed annually up to 5 years. Recommendations follow those established by the new ACR TI-RADS criteria (J Am Coll Radiol 1308;65:784-696). Electronically Signed   By: Corrie Mckusick D.O.   On: 12/11/2017 13:09    EKG EKG Interpretation  Date/Time:  Monday December 21 2017 21:39:15 EDT Ventricular Rate:  97 PR Interval:  182 QRS Duration: 90 QT Interval:  370 QTC Calculation: 469 R Axis:   60 Text Interpretation:  Normal sinus rhythm Confirmed by Randal Buba, Marcello Tuzzolino ([id number]) on 12/22/2017 3:47:30 AM   Radiology Dg Chest 2 View  Result Date: 12/21/2017 CLINICAL DATA:  Chest pain and vomiting EXAM: CHEST - 2 VIEW COMPARISON:  12/07/2017 CT of the chest FINDINGS: Cardiac shadow is mildly enlarged. Aortic calcifications are again seen. Patchy changes in the bases are again seen similar to that noted on prior CT examination. No new focal infiltrate or sizable effusion is seen. No bony abnormality is noted. IMPRESSION: Stable bibasilar changes consistent with the prior CT. Electronically Signed   By: Inez Catalina M.D.   On: 12/21/2017 22:14    Procedures Procedures (including critical care time)  Medications Ordered in ED Medications  iopamidol (ISOVUE-370) 76 % injection (has no administration in time range)  ondansetron (ZOFRAN) injection 4 mg (4 mg Intravenous Given 12/22/17 0235)  gi cocktail (Maalox,Lidocaine,Donnatal) (30 mLs Oral Given 12/22/17 0321)  iopamidol (ISOVUE-370) 76 % injection 100 mL (100 mLs Intravenous Contrast Given 12/22/17 0345)    Final Clinical Impressions(s) / ED Diagnoses   Will need admission for fluid overload    Divonte Senger, MD 12/22/17 [id number]

## 2017-12-22 NOTE — Care Management Note (Addendum)
Case Management Note  Patient Details  Name: CARIA TRANSUE MRN: [id number] Date of Birth: [birth year]  Subjective/Objective:                    Action/Plan:  PTA from home with [facility]; PT, RN and aide - agency made aware of admit.  Pt denied transportation issues to appts.  Pt informed CM that she has walker, cane and wheelchair in the home.  Pt has PCP and denied barriers with obtaining/paying for medications.  CM requested resumption orders from attending. Attending ordered Behavioral health consult;  Recurrent admissions for very similar problem that does not appear to be physiologic in nature (CSW aware of social barriers).  Expected Discharge Date:                  Expected Discharge Plan:  [place]  In-House Referral:     Discharge planning Services  CM Consult  Post Acute Care Choice:    Choice offered to:     DME Arranged:    DME Agency:     HH Arranged:    HH Agency:     Status of Service:     If discussed at [employer], dates discussed:    Additional Comments:  Maryclare Labrador, RN 12/22/2017, 12:00 PM

## 2017-12-22 NOTE — Progress Notes (Signed)
  Echocardiogram 2D Echocardiogram has been performed.  [name] 12/22/2017, 4:02 PM

## 2017-12-22 NOTE — H&P (Signed)
History and Physical    [name]:[id number] DOB: 1950-09-23 DOA: 12/21/2017  PCP: Colon Branch, MD Patient coming from: home  Chief Complaint: chest pain  HPI: [name] is a 67 y.o. female with medical history significant for attention, hyperlipidemia, diabetes type 2, obstructive sleep apnea on C Pap at night some noncompliance with medications presents to emergency Department chief complaint of chest pain. [facility] are asked to admit for chest pain rule out  Information is obtained from the chart and the patient. She states she's been or usual state of health which includes chronic shortness of breath related to asthma/COPD on 2 L nasal cannula at home, intermittent GERD/reflux until last night when she developed intermittent sharp left anterior chest pain that radiated down to her left lower quadrant of her abdomen. Associated symptoms include nausea without vomiting. She reports her vomiting is associated with eating. She denies headache dizziness syncope or near-syncope. She denies palpitations worsening shortness of breath diaphoresis lower extremity edema. She denies dysuria hematuria frequency or urgency. She denies diarrhea constipation melena bright red blood per rectum. She does endorse intermittent nonproductive coughing.  Of note patient has had chest pain workup recently including a cath which was negative. Last admission cardiology consulted who opined no need for repeat cardiology evaluation   ED Course: in the emergency department she is afebrile hemodynamically stable mild tachypnea with increased oxygen demand. She is provided with increased oxygen supplementation Lasix IV. At the time of admission she is chest pain-free  Review of Systems: As per HPI otherwise all other systems reviewed and are negative.   Ambulatory Status: patient has home health physical therapy unsteady gait recently acquired a wheelchair.  Past Medical History:    Diagnosis Date  . Anterolisthesis    Grade 1, L4-5  . Bronchospasm 05/28/2012  . CHEST PAIN 11/18/2007  . DEGENERATIVE JOINT DISEASE 10/06/2006  . DEPRESSION 09/26/2008  . DIABETES MELLITUS 10/06/2006  . Diverticulosis    [id number]  . GERD (gastroesophageal reflux disease) 07/25/2013  . HYPERLIPIDEMIA 01/11/2009  . HYPERTENSION 10/06/2006  . INSOMNIA 09/26/2008  . Internal hemorrhoids   . OBSTRUCTIVE SLEEP APNEA 06/23/2008   Severe OSA per sleep study 2010, Rx a CPAP  . Pain in joint, multiple sites 11/10/2006  . UNSPECIFIED ANEMIA 12/10/2009  . UTI (urinary tract infection) 11/2017    Past Surgical History:  Procedure Laterality Date  . COLONOSCOPY  08/01/2011   Procedure: COLONOSCOPY;  Surgeon: Inda Castle, MD;  Location: [facility];  Service: Endoscopy;  Laterality: N/A;  . LEFT HEART CATH AND CORONARY ANGIOGRAPHY N/A 10/22/2017   Procedure: LEFT HEART CATH AND CORONARY ANGIOGRAPHY;  Surgeon: Jettie Booze, MD;  Location: [facility];  Service: Cardiovascular;  Laterality: N/A;  . 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 Right knee replacement  2005    Social History   Socioeconomic History  . Marital status: Widowed    Spouse name: Not on file  . Number of children: 4  . Years of education: Not on file  . Highest education level: Not on file  Occupational History  . Occupation: disability    Employer: UNEMPLOYED  Social Needs  . Financial resource strain: Not on file  . Food insecurity:    Worry: Not on file    Inability: Not on file  . Transportation needs:    Medical: Not on file    Non-medical: Not on file  Tobacco Use  . Smoking status: Former Smoker  Packs/day: 0.20    Years: 4.00    Pack years: 0.80    Types: Cigarettes    Last attempt to quit: 05/13/1995    Years since quitting: 22.6  . Smokeless tobacco: Never Used  Substance and Sexual Activity  . Alcohol use: Not Currently  . Drug use: No  . Sexual activity: Not Currently  Lifestyle  .  Physical activity:    Days per week: Not on file    Minutes per session: Not on file  . Stress: Not on file  Relationships  . Social connections:    Talks on phone: Not on file    Gets together: Not on file    Attends religious service: Not on file    Active member of club or organization: Not on file    Attends meetings of clubs or organizations: Not on file    Relationship status: Not on file  . Intimate partner violence:    Fear of current or ex partner: Not on file    Emotionally abused: Not on file    Physically abused: Not on file    Forced sexual activity: Not on file  Other Topics Concern  . Not on file  Social History Narrative   Widow , lives by herself   Lost a son, 3 living    Lost husband    No Known Allergies  Family History  Problem Relation Age of Onset  . Asthma Mother   . Stroke Mother   . Diabetes Other        M, B, S  . Hypertension Sister        M, S,B  . Pancreatic cancer Brother   . Colon cancer Neg Hx   . Prostate cancer Neg Hx   . Breast cancer Neg Hx     Prior to Admission medications   Medication Sig Start Date End Date Taking? Authorizing Provider  acetaminophen (TYLENOL) 325 MG tablet Take 2 tablets (650 mg total) by mouth every 6 (six) hours as needed for mild pain (or Fever >/= 101). 11/20/17  Yes Emokpae, Courage, MD  albuterol (PROVENTIL HFA;VENTOLIN HFA) 108 (90 Base) MCG/ACT inhaler Inhale 2 puffs into the lungs every 4 (four) hours as needed for wheezing. 11/20/17 01/18/29 Yes Emokpae, Courage, MD  aspirin EC 81 MG tablet Take 1 tablet (81 mg total) by mouth daily. With food 11/20/17  Yes Emokpae, Courage, MD  budesonide-formoterol (SYMBICORT) 160-4.5 MCG/ACT inhaler Inhale 2 puffs into the lungs 2 (two) times daily. 11/20/17  Yes Emokpae, Courage, MD  colchicine 0.6 MG tablet Take 1 tablet (0.6 mg total) by mouth 2 (two) times daily. 11/20/17  Yes Emokpae, Courage, MD  latanoprost (XALATAN) 0.005 % ophthalmic solution USE 1 DROP IN BOTH  EYES AT BEDTIME 11/20/17  Yes Emokpae, Courage, MD  levothyroxine (SYNTHROID, LEVOTHROID) 75 MCG tablet Take 1 tablet (75 mcg total) by mouth daily before breakfast. 11/20/17  Yes Emokpae, Courage, MD  metoprolol tartrate (LOPRESSOR) 25 MG tablet Take 1.5 tablets (37.5 mg total) by mouth 2 (two) times daily. 12/18/17  Yes Almyra Deforest, PA  pantoprazole (PROTONIX) 40 MG tablet Take 1 tablet (40 mg total) by mouth daily before breakfast. 11/02/17  Yes Paz, Alda Berthold, MD  potassium chloride (K-DUR,KLOR-CON) 10 MEQ tablet Take 1 tablet (10 mEq total) by mouth daily. 11/20/17  Yes Emokpae, Courage, MD  rosuvastatin (CRESTOR) 40 MG tablet Take 1 tablet (40 mg total) by mouth daily. Patient taking differently: Take 40 mg by mouth every evening.  11/20/17  Yes Emokpae, Courage, MD  sitaGLIPtin (JANUVIA) 50 MG tablet Take 1 tablet (50 mg total) by mouth daily. 12/04/17  Yes Renato Shin, MD  ACCU-CHEK AVIVA PLUS test strip TEST BLOD SUGAR TWICE DAILY AND LANCETS TWICE DAILY 10/15/17   Renato Shin, MD  ACCU-CHEK SOFTCLIX LANCETS lancets USE TO CHECK BLOOD SUGAR TWICE A DAY 10/05/17   Renato Shin, MD  OXYGEN Inhale 3 L into the lungs continuous.     [provider]    Physical Exam: Vitals:   12/22/17 0700 12/22/17 0717 12/22/17 0730 12/22/17 0800  BP: 116/69  102/61 (!) 107/59  Pulse: 93 96 (!) 101 (!) 108  Resp: (!) 24 (!) 23  20  Temp:      TempSrc:      SpO2: 99% 100% 96% 95%  Weight:      Height:         General:  Appears calm and comfortable in no acute distress Eyes:  PERRL, EOMI, normal lids, iris ENT:  grossly normal hearing, lips & tongue, mucous membranes of her mouth are pink slightly dry Neck:  no LAD, masses or thyromegaly Cardiovascular:  RRR, no m/r/g. trace LE edema.  Respiratory:  No increased work of breathing breath sounds are coarse  very faint end expiratory wheezing Abdomen:  soft, ntnd, positive bowel sounds no guarding or rebounding Skin:  no rash or induration seen on  limited exam Musculoskeletal:  grossly normal tone BUE/BLE, good ROM, no bony abnormality Psychiatric:  grossly normal mood and affect, speech fluent and appropriate, AOx3 Neurologic:  CN 2-12 grossly intact, moves all extremities in coordinated fashion, sensation intact  Labs on Admission: I have personally reviewed following labs and imaging studies  CBC: Recent Labs  Lab 12/21/17 2142  WBC 3.8*  HGB 11.3*  HCT 35.9*  MCV 76.5*  PLT 253   Basic Metabolic Panel: Recent Labs  Lab 12/21/17 2142  NA 139  K 3.3*  CL 105  CO2 24  GLUCOSE 84  BUN 7*  CREATININE 1.45*  CALCIUM 8.7*   GFR: Estimated Creatinine Clearance: 49.2 mL/min (A) (by C-G formula based on SCr of 1.45 mg/dL (H)). Liver Function Tests: Recent Labs  Lab 12/21/17 2142  AST 58*  ALT 34  ALKPHOS 61  BILITOT 0.8  PROT 7.7  ALBUMIN 3.0*   Recent Labs  Lab 12/21/17 2142  LIPASE 51   No results for input(s): AMMONIA in the last 168 hours. Coagulation Profile: No results for input(s): INR, PROTIME in the last 168 hours. Cardiac Enzymes: No results for input(s): CKTOTAL, CKMB, CKMBINDEX, TROPONINI in the last 168 hours. BNP (last 3 results) No results for input(s): PROBNP in the last 8760 hours. HbA1C: No results for input(s): HGBA1C in the last 72 hours. CBG: Recent Labs  Lab 12/22/17 0809  GLUCAP 76   Lipid Profile: No results for input(s): CHOL, HDL, LDLCALC, TRIG, CHOLHDL, LDLDIRECT in the last 72 hours. Thyroid Function Tests: No results for input(s): TSH, T4TOTAL, FREET4, T3FREE, THYROIDAB in the last 72 hours. Anemia Panel: No results for input(s): VITAMINB12, FOLATE, FERRITIN, TIBC, IRON, RETICCTPCT in the last 72 hours. Urine analysis:    Component Value Date/Time   COLORURINE YELLOW 11/17/2017 1323   APPEARANCEUR CLOUDY (A) 11/17/2017 1323   LABSPEC 1.015 11/17/2017 1323   PHURINE 5.0 11/17/2017 1323   GLUCOSEU NEGATIVE 11/17/2017 1323   GLUCOSEU NEGATIVE 11/30/2013 1057    HGBUR MODERATE (A) 11/17/2017 1323   BILIRUBINUR NEGATIVE 11/17/2017 1323   KETONESUR  NEGATIVE 11/17/2017 1323   PROTEINUR 100 (A) 11/17/2017 1323   UROBILINOGEN 0.2 11/30/2013 1057   NITRITE NEGATIVE 11/17/2017 1323   LEUKOCYTESUR LARGE (A) 11/17/2017 1323    Creatinine Clearance: Estimated Creatinine Clearance: 49.2 mL/min (A) (by C-G formula based on SCr of 1.45 mg/dL (H)).  Sepsis Labs: @LABRCNTIP (procalcitonin:4,lacticidven:4) )No results found for this or any previous visit (from the past 240 hour(s)).   Radiological Exams on Admission: Dg Chest 2 View  Result Date: 12/21/2017 CLINICAL DATA:  Chest pain and vomiting EXAM: CHEST - 2 VIEW COMPARISON:  12/07/2017 CT of the chest FINDINGS: Cardiac shadow is mildly enlarged. Aortic calcifications are again seen. Patchy changes in the bases are again seen similar to that noted on prior CT examination. No new focal infiltrate or sizable effusion is seen. No bony abnormality is noted. IMPRESSION: Stable bibasilar changes consistent with the prior CT. Electronically Signed   By: Inez Catalina M.D.   On: 12/21/2017 22:14   Ct Angio Chest Pe W And/or Wo Contrast  Result Date: 12/22/2017 CLINICAL DATA:  Sharp chest pain starting last night. Pain on palpation. Shortness of breath. EXAM: CT ANGIOGRAPHY CHEST WITH CONTRAST TECHNIQUE: Multidetector CT imaging of the chest was performed using the standard protocol during bolus administration of intravenous contrast. Multiplanar CT image reconstructions and MIPs were obtained to evaluate the vascular anatomy. CONTRAST:  115mL ISOVUE-370 IOPAMIDOL (ISOVUE-370) INJECTION 76% COMPARISON:  12/07/2017 FINDINGS: Cardiovascular: There is good opacification of the central and segmental pulmonary arteries. No focal filling defects. No evidence of significant pulmonary embolus. Normal caliber thoracic aorta with scattered calcification. Normal heart size. No pericardial effusions. Mediastinum/Nodes: No significant  lymphadenopathy in the chest. Scattered lymph nodes are not pathologically enlarged. Esophagus is not significantly dilated but there is an air-fluid level with fluid in the esophagus. This may be due to reflux or dysmotility. Distal obstructing lesion is not identified. Similar appearance to previous study. Right adrenal gland nodule again demonstrated. Lungs/Pleura: Diffuse airspace disease throughout the lungs, most prominent in the bases. Airways are patent with mild bronchiectasis. No focal consolidation. No pleural effusions. No pneumothorax. Similar appearance to previous study. Upper Abdomen: No acute changes identified. Musculoskeletal: No chest wall abnormality. No acute or significant osseous findings. Review of the MIP images confirms the above findings. IMPRESSION: 1. No evidence of significant pulmonary embolus. 2. Diffuse airspace disease throughout the lungs, most prominent in the bases. This could represent pneumonia or edema. Similar appearance to previous study. 3. Air-fluid level in the esophagus may be due to reflux or dysmotility. Aortic Atherosclerosis (ICD10-I70.0). Electronically Signed   By: Lucienne Capers M.D.   On: 12/22/2017 04:21    EKG: Independently reviewed. Normal sinus rhythm  Assessment/Plan Principal Problem:   Chest pain Active Problems:   ARF (acute renal failure) (HCC)   Acute CHF (congestive heart failure) (HCC)   DM II (diabetes mellitus, type II), controlled ([place])   Obstructive sleep apnea   Essential hypertension   GERD (gastroesophageal reflux disease)   Hypothyroidism   Elevated troponin   #1. Chest pain/elevated troponin. Atypical. patient had multiple cardiac workups. Recent cardiac cath negative. Elevated troponin likely related to renal insufficiency. Chart review indicates history of flat troponins. EKG with no acute changes. No ACS type symptoms.  Pain-free at the time of admission -admit to telemetry -cycle troponins -Serial EKG -Continue  Crestor -continue aspirin  #2. acute diastolic heart failure. BNP slightly elevated.chest x-ray With stable by basilar changes consistent with prior CT. CT angio chest negative  for PE, with diffuse airspace disease throughout lungs most prominent in the bases possibly representing pneumonia or edema similar appearance to previous study, air fluid level in the esophagus may be due to reflux or dysmotility. She is provided with IV Lasix in the emergency department. Diuresing well -Continue IV Lasix -Monitor intake and output -Monitor oxygen saturation level -Wean oxygen back to baseline as able  #3. Acute renal failure. Suspect there is a component of chronic kidney disease involved. Recent renal ultrasound with normal limits. Creatinine 1.45 on admission. -Hold nephrotoxins as able -Monitor urine output -Recheck in the morning  #4. Diabetes. Oral agents only. Patient has had episodes of hypoglycemia during last hospitalization. Recent hemoglobin A1c 5.6 -We'll hold home meds for now -Sliding scale insulin for optimal control  #5. Hypertension. Fair control in the emergency department. Home medications include metoprolol -Continue metoprolol  #6. GERD. With a history of severe reflux  #7. Hypothyroidism. Home medications include Synthroid. Last admission TSH 6.7, free T4 1 0.21. There were no changes in her dosage -Continue home Synthroid -Recheck TSH     DVT prophylaxis: lovenox  Code Status: full  Family Communication: none present  Disposition Plan: home  Consults called: none  Admission status: obs    Boyd Litaker M NP [facility]  If 7PM-7AM, please contact night-coverage [email or web address] Password [facility]  12/22/2017, 8:18 AM

## 2017-12-23 DIAGNOSIS — E1122 Type 2 diabetes mellitus with diabetic chronic kidney disease: Secondary | ICD-10-CM

## 2017-12-23 DIAGNOSIS — N181 Chronic kidney disease, stage 1: Secondary | ICD-10-CM

## 2017-12-23 DIAGNOSIS — R7989 Other specified abnormal findings of blood chemistry: Secondary | ICD-10-CM | POA: Diagnosis not present

## 2017-12-23 DIAGNOSIS — F419 Anxiety disorder, unspecified: Secondary | ICD-10-CM | POA: Diagnosis not present

## 2017-12-23 DIAGNOSIS — F329 Major depressive disorder, single episode, unspecified: Secondary | ICD-10-CM | POA: Diagnosis not present

## 2017-12-23 DIAGNOSIS — R079 Chest pain, unspecified: Secondary | ICD-10-CM

## 2017-12-23 DIAGNOSIS — E039 Hypothyroidism, unspecified: Secondary | ICD-10-CM | POA: Diagnosis not present

## 2017-12-23 DIAGNOSIS — I5031 Acute diastolic (congestive) heart failure: Secondary | ICD-10-CM | POA: Diagnosis not present

## 2017-12-23 DIAGNOSIS — R0789 Other chest pain: Secondary | ICD-10-CM | POA: Diagnosis not present

## 2017-12-23 DIAGNOSIS — I130 Hypertensive heart and chronic kidney disease with heart failure and stage 1 through stage 4 chronic kidney disease, or unspecified chronic kidney disease: Secondary | ICD-10-CM | POA: Diagnosis not present

## 2017-12-23 DIAGNOSIS — N179 Acute kidney failure, unspecified: Secondary | ICD-10-CM | POA: Diagnosis not present

## 2017-12-23 LAB — RAPID URINE DRUG SCREEN, HOSP PERFORMED
Amphetamines: NOT DETECTED
BARBITURATES: NOT DETECTED
Benzodiazepines: NOT DETECTED
COCAINE: NOT DETECTED
Opiates: NOT DETECTED
TETRAHYDROCANNABINOL: NOT DETECTED

## 2017-12-23 LAB — GLUCOSE, CAPILLARY
GLUCOSE-CAPILLARY: 101 mg/dL — AB (ref 70–99)
Glucose-Capillary: 80 mg/dL (ref 70–99)

## 2017-12-23 MED ORDER — MELATONIN 3 MG PO TABS: 3.0000 mg | ORAL_TABLET | Freq: Every evening | ORAL | 0 refills | Status: DC | PRN

## 2017-12-23 MED ORDER — ESCITALOPRAM OXALATE 10 MG PO TABS: 10.0000 mg | ORAL_TABLET | Freq: Every day | ORAL | 0 refills | Status: DC

## 2017-12-23 MED ORDER — ESCITALOPRAM OXALATE 10 MG PO TABS: 10.0000 mg | ORAL_TABLET | Freq: Every day | ORAL | Status: DC

## 2017-12-23 MED ORDER — FUROSEMIDE 20 MG PO TABS: 20.0000 mg | ORAL_TABLET | Freq: Every day | ORAL | 0 refills | Status: DC

## 2017-12-23 NOTE — Progress Notes (Signed)
Nutrition Brief Note  Patient identified on the Malnutrition Screening Tool (MST) Report  Wt Readings from Last 15 Encounters:  12/23/17 102.9 kg  12/18/17 110.7 kg  12/07/17 110.8 kg  12/04/17 115.2 kg  12/03/17 114.8 kg  12/01/17 114.8 kg  11/25/17 115.1 kg  11/18/17 116.1 kg  11/12/17 118.4 kg  10/24/17 126.7 kg  10/19/17 130.2 kg  09/11/17 130.2 kg  08/07/17 (!) 136.3 kg  08/04/17 (!) 137.4 kg  08/01/17 (!) 138.8 kg   [name] is a 67 y.o. female with a Past Medical History of OSA on CPAP; HTN; HLD; and DM presenting with CP.    Pt admitted with chest pain and r/o MI.   Noted pt has experienced a 25.9% wt loss x 1 year, however, suspect that fluid changes may to contributing to weight loss.  Case discussed with RN, who reports no nutritional problems have been identified with pt. Pt will likely discharge later this afternoon pending psych evaluation.  Last Hgb A1c: 5.6 (10/20/17). PTA DM medications are 50 mg [place] daily.   Labs reviewed: CBGS: 75-100 (inpatient orders for glycemic control are 0-5 units insulin aspart q HS and 0-9 units insulin aspart TID with meals).   Body mass index is 33.5 kg/m. Patient meets criteria for obesity, class I based on current BMI.   Current diet order is Heart Healthy/ Carb Modified, patient is consuming approximately n/a% of meals at this time. Labs and medications reviewed.   No nutrition interventions warranted at this time. If nutrition issues arise, please consult RD.   Maci Eickholt A. Jimmye Norman, RD, LDN, CDE Pager: [phone] After hours Pager: [phone]

## 2017-12-23 NOTE — Progress Notes (Signed)
Patient's symptoms have resolved.  She gave me permission to speak with her son.  He says she has some paranoid behavior outside of the hospital as well.  PLan to d/c later today after psychiatry evaluation if able. Eulogio Bear DO

## 2017-12-23 NOTE — Care Management Obs Status (Signed)
[facility] NOTIFICATION   Patient Details  Name: [name] MRN: [id number] Date of Birth: [birth year]   Medicare Observation Status Notification Given:  Yes    Maryclare Labrador, RN 12/23/2017, 4:11 PM

## 2017-12-23 NOTE — Care Management Note (Addendum)
Case Management Note  Patient Details  Name: [name] MRN: [id number] Date of Birth: March 11, 1951  Subjective/Objective:                    Action/Plan:  PTA from home with [facility]; PT, RN and aide - agency made aware of admit.  Pt denied transportation issues to appts.  Pt informed CM that she has walker, cane and wheelchair in the home.  Pt has PCP and denied barriers with obtaining/paying for medications.  CM requested resumption orders from attending. Attending ordered Behavioral health consult;  Recurrent admissions for very similar problem that does not appear to be physiologic in nature (CSW aware of social barriers).  Expected Discharge Date:  12/23/17               Expected Discharge Plan:  [place]  In-House Referral:     Discharge planning Services  CM Consult  Post Acute Care Choice:    Choice offered to:     DME Arranged:    DME Agency:     HH Arranged:    HH Agency:     Status of Service:     If discussed at [employer], dates discussed:    Additional Comments: 12/23/2017 Agency informed that pt will discharge home today.  Psych cleared pt.  Pt unwilling to discuss readmits with CM.  Pt will discharge home on 3 liters oxygen - home dose, pt has portable oxygen tank her with her for transport home Maryclare Labrador, RN 12/23/2017, 4:11 PM

## 2017-12-23 NOTE — Consult Note (Signed)
[facility] Consult   Reason for Consult:  Paranoia  Referring Physician:  Dr. Eliseo Squires Patient Identification: [name] MRN:  [id number] Principal Diagnosis: Anxiety and depression Diagnosis:   Patient Active Problem List   Diagnosis Date Noted  . Acute CHF (congestive heart failure) ([place]) [I50.9] 12/22/2017  . Hypothyroidism [E03.9] 12/22/2017  . ILD (interstitial lung disease) ([place]) [J84.9] 11/25/2017  . Acute hypoxemic respiratory failure ([place]) [J96.01] 11/18/2017  . Tachycardia [R00.0] 11/17/2017  . ARF (acute renal failure) ([place]) [N17.9] 11/17/2017  . Elevated troponin [R74.8] 11/17/2017  . Chronic respiratory failure with hypoxia ([place]) [J96.11] 11/12/2017  . Shortness of breath [R06.02]   . Pericardial effusion [I31.3]   . Pain [R52] 10/19/2017  . Paresthesia [R20.2] 10/19/2017  . Weakness [R53.1] 10/19/2017  . Neck pain [M54.2] 10/19/2017  . PCP NOTES >>> [Z09] 02/23/2015  . Nocturnal oxygen desaturation [G47.34] 01/02/2015  . Morbid obesity ([place]) [E66.01] 01/02/2015  . Asthma, chronic [J45.909] 01/02/2015  . GERD (gastroesophageal reflux disease) [K21.9] 07/25/2013  . DOE (dyspnea on exertion) [R06.09] 04/25/2013  . 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 Bronchospasm [J98.01] 05/28/2012  . Internal hemorrhoids without mention of complication [M27.0] 78/67/5449  . Annual physical exam [E01.00] 06/06/2011  . Anemia [D64.9] 12/10/2009  . SKIN LESION [L98.9] 08/06/2009  . Hyperlipidemia [E78.5] 01/11/2009  . Depression [F32.9] 09/26/2008  . INSOMNIA [G47.00] 09/26/2008  . Obstructive sleep apnea [G47.33] 06/23/2008  . OBSTRUCTIVE SLEEP APNEA [G47.33] 06/23/2008  . Chest pain [R07.9] 11/18/2007  . DM II (diabetes mellitus, type II), controlled ([place]) [E11.9] 10/06/2006  . Essential hypertension [I10] 10/06/2006  . Osteoarthritis [M19.90] 10/06/2006    Total Time spent with patient: 1  hour  Subjective:   [name] is a 67 y.o. female patient admitted with chest pain.  HPI:   Per chart review, patient presents to the hospital with chest pain. She has had multiple presentations to the ED for somatic complaints and multiple cardiac workups. Psychiatry was consulted due to recurrent admissions for similar presentations that do not appear to be physiologic in nature. She is reporting paranoia to primary team.   On interview, [name] reports that she feels better and wants to go home.  She does admit to poorly managed anxiety with panic attacks when she becomes upset.  She reports that she has been dealing with anxiety since she was in her 57s after her husband was murdered.  She reports that she was remarried and her second husband was also murdered.  Her son passed away in 07-30-2014.  She has panic attacks up to twice a day and these episodes last for a couple minutes at a time.  She experiences tachycardia and dyspnea during these episodes.  She also reports depressed mood with problems initiating sleep and low energy.  She is diagnosed with OSA but she does not wear a CPAP.  She attributes her low energy to recent illness.  She reports losing about 60 pounds since February after she developed pneumonia.  She denies problems with appetite although food does not taste the same.  She denies SI, HI or AVH.  She denies a history of suicide attempts.  She denies a history of manic symptoms (decreased need for sleep, increased energy, pressured speech or euphoria).   Past Psychiatric History: Depression   Risk to Self:  None. Denies SI. Risk to Others:  None. Denies HI.  Prior Inpatient Therapy:  Denies  Prior Outpatient Therapy:  Denies  Past Medical History:  Past Medical History:  Diagnosis Date  . Anterolisthesis    Grade 1, L4-5  . Bronchospasm 05/28/2012  . CHEST PAIN 11/18/2007  . DEGENERATIVE JOINT DISEASE 10/06/2006  . DEPRESSION 09/26/2008  . DIABETES MELLITUS  10/06/2006  . Diverticulosis    5427,[id number]  . GERD (gastroesophageal reflux disease) 07/25/2013  . HYPERLIPIDEMIA 01/11/2009  . HYPERTENSION 10/06/2006  . INSOMNIA 09/26/2008  . Internal hemorrhoids   . OBSTRUCTIVE SLEEP APNEA 06/23/2008   Severe OSA per sleep study 2010, Rx a CPAP  . Pain in joint, multiple sites 11/10/2006  . UNSPECIFIED ANEMIA 12/10/2009  . UTI (urinary tract infection) 11/2017    Past Surgical History:  Procedure Laterality Date  . COLONOSCOPY  08/01/2011   Procedure: COLONOSCOPY;  Surgeon: Inda Castle, MD;  Location: [facility];  Service: Endoscopy;  Laterality: N/A;  . LEFT HEART CATH AND CORONARY ANGIOGRAPHY N/A 10/22/2017   Procedure: LEFT HEART CATH AND CORONARY ANGIOGRAPHY;  Surgeon: Jettie Booze, MD;  Location: [facility];  Service: Cardiovascular;  Laterality: N/A;  . 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 Right knee replacement  2005   Family History:  Family History  Problem Relation Age of Onset  . Asthma Mother   . Stroke Mother   . Diabetes Other        M, B, S  . Hypertension Sister        M, S,B  . Pancreatic cancer Brother   . Colon cancer Neg Hx   . Prostate cancer Neg Hx   . Breast cancer Neg Hx    Family Psychiatric  History: Denies  Social History:  Social History   Substance and Sexual Activity  Alcohol Use Not Currently     Social History   Substance and Sexual Activity  Drug Use No    Social History   Socioeconomic History  . Marital status: Widowed    Spouse name: Not on file  . Number of children: 4  . Years of education: Not on file  . Highest education level: Not on file  Occupational History  . Occupation: disability    Employer: UNEMPLOYED  Social Needs  . Financial resource strain: Not on file  . Food insecurity:    Worry: Not on file    Inability: Not on file  . Transportation needs:    Medical: Not on file    Non-medical: Not on file  Tobacco Use  . Smoking status: Former Smoker     Packs/day: 0.20    Years: 4.00    Pack years: 0.80    Types: Cigarettes    Last attempt to quit: 05/13/1995    Years since quitting: 22.6  . Smokeless tobacco: Never Used  Substance and Sexual Activity  . Alcohol use: Not Currently  . Drug use: No  . Sexual activity: Not Currently  Lifestyle  . Physical activity:    Days per week: Not on file    Minutes per session: Not on file  . Stress: Not on file  Relationships  . Social connections:    Talks on phone: Not on file    Gets together: Not on file    Attends religious service: Not on file    Active member of club or organization: Not on file    Attends meetings of clubs or organizations: Not on file    Relationship status: Not on file  Other Topics Concern  . Not on file  Social History Narrative  Widow , lives by herself   Lost a son, 3 living    Lost husband   Additional Social History: She is from [place]. She lives at home alone. She has 3 adult sons. She previously worked in [employer]. She receives SSDI widow benefits. She denies alcohol or illicit substance use.     Allergies:  No Known Allergies  Labs:  Results for orders placed or performed during the hospital encounter of 12/21/17 (from the past 48 hour(s))  CBC     Status: Abnormal   Collection Time: 12/21/17  9:42 PM  Result Value Ref Range   WBC 3.8 (L) 4.0 - 10.5 K/uL    Comment: CORRECTED ON 08/12 AT 2209: PREVIOUSLY REPORTED AS 3.9   RBC 4.69 3.87 - 5.11 MIL/uL    Comment: CORRECTED ON 08/12 AT 2209: PREVIOUSLY REPORTED AS 4.79   Hemoglobin 11.3 (L) 12.0 - 15.0 g/dL   HCT 35.9 (L) 36.0 - 46.0 %    Comment: CORRECTED ON 08/12 AT 2209: PREVIOUSLY REPORTED AS 36.6   MCV 76.5 (L) 78.0 - 100.0 fL    Comment: CORRECTED ON 08/12 AT 2209: PREVIOUSLY REPORTED AS 76.4   MCH 24.1 (L) 26.0 - 34.0 pg    Comment: CORRECTED ON 08/12 AT 2209: PREVIOUSLY REPORTED AS 23.6   MCHC 31.5 30.0 - 36.0 g/dL    Comment: CORRECTED ON 08/12 AT 2209: PREVIOUSLY REPORTED AS  30.9   RDW 22.1 (H) 11.5 - 15.5 %    Comment: CORRECTED ON 08/12 AT 2209: PREVIOUSLY REPORTED AS 22.4   Platelets 289 150 - 400 K/uL    Comment: Performed at [facility], [place]. [address] [place], [place] [address] CORRECTED ON 08/12 AT 2209: PREVIOUSLY REPORTED AS 285   Lipase, blood     Status: None   Collection Time: 12/21/17  9:42 PM  Result Value Ref Range   Lipase 51 11 - 51 U/L    Comment: Performed at [facility] [address], [place], [place] [address]  Comprehensive metabolic panel     Status: Abnormal   Collection Time: 12/21/17  9:42 PM  Result Value Ref Range   Sodium 139 135 - 145 mmol/L   Potassium 3.3 (L) 3.5 - 5.1 mmol/L   Chloride 105 98 - 111 mmol/L   CO2 24 22 - 32 mmol/L   Glucose, Bld 84 70 - 99 mg/dL   BUN 7 (L) 8 - 23 mg/dL   Creatinine, Ser 1.45 (H) 0.44 - 1.00 mg/dL   Calcium 8.7 (L) 8.9 - 10.3 mg/dL   Total Protein 7.7 6.5 - 8.1 g/dL   Albumin 3.0 (L) 3.5 - 5.0 g/dL   AST 58 (H) 15 - 41 U/L   ALT 34 0 - 44 U/L   Alkaline Phosphatase 61 38 - 126 U/L   Total Bilirubin 0.8 0.3 - 1.2 mg/dL   GFR calc non Af Amer 36 (L) >60 mL/min   GFR calc Af Amer 42 (L) >60 mL/min    Comment: (NOTE) The eGFR has been calculated using the CKD EPI equation. This calculation has not been validated in all clinical situations. eGFR's persistently <60 mL/min signify possible Chronic Kidney Disease.    Anion gap 10 5 - 15    Comment: Performed at [facility] [address] [place], [place] [address]  Brain natriuretic peptide     Status: Abnormal   Collection Time: 12/21/17  9:42 PM  Result Value Ref Range   B  Natriuretic Peptide 406.6 (H) 0.0 - 100.0 pg/mL    Comment: Performed at [facility], [place] [address] [place], [place] [address]  I-stat troponin, ED     Status: None   Collection Time: 12/21/17  9:46 PM  Result Value Ref Range   Troponin i, poc 0.05 0.00 - 0.08 ng/mL   Comment 3            Comment: Due to the release kinetics of  cTnI, a negative result within the first hours of the onset of symptoms does not rule out myocardial infarction with certainty. If myocardial infarction is still suspected, repeat the test at appropriate intervals.   Troponin I-serum (0, 3, 6 hours)     Status: Abnormal   Collection Time: 12/22/17  7:24 AM  Result Value Ref Range   Troponin I 0.04 (HH) <0.03 ng/mL    Comment: CRITICAL RESULT CALLED TO, READ BACK BY AND VERIFIED WITH: Jamal Collin [id number] WILDERK Performed at [facility], [place] [address], [place], [place] [address]   TSH     Status: Abnormal   Collection Time: 12/22/17  7:26 AM  Result Value Ref Range   TSH 11.593 (H) 0.350 - 4.500 uIU/mL    Comment: Performed by a 3rd Generation assay with a functional sensitivity of <=0.01 uIU/mL. Performed at [facility], [place] [address], [place], [place] [address]   CBG monitoring, ED     Status: None   Collection Time: 12/22/17  8:09 AM  Result Value Ref Range   Glucose-Capillary 76 70 - 99 mg/dL  MRSA PCR Screening     Status: None   Collection Time: 12/22/17  8:41 AM  Result Value Ref Range   MRSA by PCR NEGATIVE NEGATIVE    Comment:        The GeneXpert MRSA Assay (FDA approved for NASAL specimens only), is one component of a comprehensive MRSA colonization surveillance program. It is not intended to diagnose MRSA infection nor to guide or monitor treatment for MRSA infections. Performed at [facility], [place] [address] [place], [place] [address]   Troponin I-serum (0, 3, 6 hours)     Status: Abnormal   Collection Time: 12/22/17  9:19 AM  Result Value Ref Range   Troponin I 0.04 (HH) <0.03 ng/mL    Comment: CRITICAL VALUE NOTED.  VALUE IS CONSISTENT WITH PREVIOUSLY REPORTED AND CALLED VALUE. Performed at [facility], [place] [address] [place], [place] [address]   Glucose, capillary     Status: None   Collection Time: 12/22/17 12:14 PM  Result Value Ref Range   Glucose-Capillary 80 70 -  99 mg/dL   Comment 1 Notify RN    Comment 2 Document in Chart   Troponin I-serum (0, 3, 6 hours)     Status: Abnormal   Collection Time: 12/22/17 12:39 PM  Result Value Ref Range   Troponin I 0.04 (HH) <0.03 ng/mL    Comment: CRITICAL VALUE NOTED.  VALUE IS CONSISTENT WITH PREVIOUSLY REPORTED AND CALLED VALUE. Performed at [facility], [place] [address], [place], [place] [address]   T4, free     Status: None   Collection Time: 12/22/17 12:39 PM  Result Value Ref Range   Free T4 1.19 0.82 - 1.77 ng/dL    Comment: (NOTE) Biotin ingestion may interfere with free T4 tests. If the results are inconsistent with the TSH level, previous test results, or the clinical presentation, then  consider biotin interference. If needed, order repeat testing after stopping biotin. Performed at [facility], [place] [address], [place], [place] [address]   Glucose, capillary     Status: None   Collection Time: 12/22/17  5:12 PM  Result Value Ref Range   Glucose-Capillary 75 70 - 99 mg/dL   Comment 1 Notify RN    Comment 2 Document in Chart   Glucose, capillary     Status: Abnormal   Collection Time: 12/22/17  9:41 PM  Result Value Ref Range   Glucose-Capillary 100 (H) 70 - 99 mg/dL  Glucose, capillary     Status: None   Collection Time: 12/23/17  7:45 AM  Result Value Ref Range   Glucose-Capillary 80 70 - 99 mg/dL    Current Facility-Administered Medications  Medication Dose Route Frequency Provider Last Rate Last Dose  . acetaminophen (TYLENOL) tablet 650 mg  650 mg Oral Q6H PRN Radene Gunning, NP      . aspirin EC tablet 81 mg  81 mg Oral Daily Radene Gunning, NP   81 mg at 12/23/17 6644  . colchicine tablet 0.6 mg  0.6 mg Oral BID Radene Gunning, NP   0.6 mg at 12/23/17 0347  . enoxaparin (LOVENOX) injection 40 mg  40 mg Subcutaneous Q24H Black, Karen M, NP   40 mg at 12/23/17 1135  . furosemide (LASIX) injection 40 mg  40 mg Intravenous BID Radene Gunning, NP   40 mg at 12/23/17 4259   . gi cocktail (Maalox,Lidocaine,Donnatal)  30 mL Oral QID PRN Black, Karen M, NP      . insulin aspart (novoLOG) injection 0-5 Units  0-5 Units Subcutaneous QHS Black, Karen M, NP      . insulin aspart (novoLOG) injection 0-9 Units  0-9 Units Subcutaneous TID WC Black, Lezlie Octave, NP      . ipratropium-albuterol (DUONEB) 0.5-2.5 (3) MG/3ML nebulizer solution 3 mL  3 mL Nebulization Q6H Black, Lezlie Octave, NP   3 mL at 12/23/17 0734  . latanoprost (XALATAN) 0.005 % ophthalmic solution 1 drop  1 drop Both Eyes QHS Radene Gunning, NP   1 drop at 12/22/17 2205  . levothyroxine (SYNTHROID, LEVOTHROID) tablet 75 mcg  75 mcg Oral QAC breakfast Radene Gunning, NP   75 mcg at 12/23/17 5638  . metoprolol tartrate (LOPRESSOR) tablet 37.5 mg  37.5 mg Oral BID Dyanne Carrel M, NP   37.5 mg at 12/23/17 7564  . morphine 2 MG/ML injection 2 mg  2 mg Intravenous Q2H PRN Radene Gunning, NP      . ondansetron [facility]) injection 4 mg  4 mg Intravenous Q6H PRN Black, Karen M, NP      . pantoprazole (PROTONIX) EC tablet 40 mg  40 mg Oral QAC breakfast Radene Gunning, NP   40 mg at 12/23/17 0807  . potassium chloride (K-DUR,KLOR-CON) CR tablet 10 mEq  10 mEq Oral Daily Radene Gunning, NP   10 mEq at 12/23/17 3329  . rosuvastatin (CRESTOR) tablet 40 mg  40 mg Oral QPM Radene Gunning, NP   40 mg at 12/22/17 1833    Musculoskeletal: Strength & Muscle Tone: within normal limits Gait & Station: UTA since patient was sitting in a chair. Patient leans: N/A  Psychiatric Specialty Exam: Physical Exam  Nursing note and vitals reviewed. Constitutional: She appears well-developed and well-nourished.  HENT:  Head: Normocephalic and atraumatic.  Neck: Normal range of motion.  Respiratory: Effort  normal.  Musculoskeletal: Normal range of motion.  Skin: No rash noted.  Psychiatric: She has a normal mood and affect. Her behavior is normal. Judgment and thought content normal. Cognition and memory are normal.    Review of Systems   Constitutional: Negative for chills and fever.  Cardiovascular: Negative for chest pain.  Gastrointestinal: Positive for nausea and vomiting. Negative for abdominal pain, constipation and diarrhea.  Psychiatric/Behavioral: Positive for depression. Negative for hallucinations, substance abuse and suicidal ideas. The patient is nervous/anxious and has insomnia.   All other systems reviewed and are negative.   Blood pressure 120/77, pulse 89, temperature (!) 97.4 F (36.3 C), temperature source Oral, resp. rate (!) 23, height '5\' 9"'$  (1.753 m), weight 102.9 kg, SpO2 91 %.Body mass index is 33.5 kg/m.  General Appearance: Fairly Groomed, elderly, African American female, wearing a hospital gown with her hair in a ponytail and sitting upright in a chair. NAD.   Eye Contact:  Good  Speech:  Clear and Coherent and Normal Rate  Volume:  Normal  Mood:  "I feel better."  Affect:  Appropriate and Full Range. Tearful when discusses the death of her son.   Thought Process:  Goal Directed, Linear and Descriptions of Associations: Intact  Orientation:  Full (Time, Place, and Person)  Thought Content:  Logical  Suicidal Thoughts:  No  Homicidal Thoughts:  No  Memory:  Immediate;   Good Recent;   Good Remote;   Good  Judgement:  Fair  Insight:  Fair  Psychomotor Activity:  Normal  Concentration:  Concentration: Good and Attention Span: Good  Recall:  Good  Fund of Knowledge:  Good  Language:  Good  Akathisia:  No  Handed:  Right  AIMS (if indicated):   N/A  Assets:  Communication Skills Desire for Improvement Financial Resources/Insurance Housing Social Support  ADL's:  Intact  Cognition:  WNL  Sleep:   Poor   Assessment:  [name] is a 67 y.o. female who was admitted with chest pain. Psychiatry was consulted due to recurrent admissions for similar presentations that do not appear to be physiologic in nature. She does endorse poorly managed anxiety with panic attacks which  contributes to her somatic symptoms. She also reports depressed mood in the setting of losing multiple loved ones. She is agreeable to starting Lexapro for depression and anxiety. She denies SI, HI or AVH. She does not endorse paranoid thought content. She does not warrant inpatient psychiatric hospitalization at this time.   Treatment Plan Summary: -Start Lexapro 10 mg daily for depression and anxiety.  -Start Melatonin 3-6 mg qhs for insomnia.  -TSH is significantly abnormal (11.593) and may be contributing to her mood and anxiety symptoms. She should follow up with her outpatient provider for management of thyroid disease.  -Please have SW provide patient with resources for outpatient therapists to discuss ongoing stressors.  -Psychiatry will sign off on patient at this time. Please consult psychiatry again as needed.   Disposition: No evidence of imminent risk to self or others at present.   Patient does not meet criteria for psychiatric inpatient admission.  Faythe Dingwall, DO 12/23/2017 11:37 AM

## 2017-12-23 NOTE — Discharge Summary (Signed)
Physician Discharge Summary  [name] NID:[id number] DOB: [birth year] DOA: 12/21/2017  PCP: Colon Branch, MD  Admit date: 12/21/2017 Discharge date: 12/23/2017  Admitted From: home Discharge disposition: home   Recommendations for Outpatient Follow-Up:   1. Resume home health 2. Lasix started-- will need close follow up for labs   Discharge Diagnosis:   Principal Problem:   Chest pain Active Problems:   DM II (diabetes mellitus, type II), controlled ([place])   Obstructive sleep apnea   Essential hypertension   GERD (gastroesophageal reflux disease)   ARF (acute renal failure) ([facility])   Elevated troponin   Acute CHF (congestive heart failure) ([place])   Hypothyroidism    Discharge Condition: Improved.  Diet recommendation: Low sodium, heart healthy.  Carbohydrate-modified.  Regular.  Wound care: None.  Code status: Full.   History of Present Illness:   [name] is a 67 y.o. female with a Past Medical History of OSA on CPAP; HTN; HLD; and DM presenting with CP.  This is her 4th visit in the last 6 months, with similar story the last time I admitted her on 7/4.  Today, she also reported that various staff in the hospital are "being mean" to her and looking into her room and talking about her from the hall.  In June, she had a pericardial effusion with +ANA and was started on colchicine for pericarditis.  She had a negative recent heart cath.  Pulm was consulted and was concerned about the possibility of drug-induced lupus due to diltiazem.  Esophageal motility disorder was also considered.  During a subsequent admission, she had an unresponsive episode with extensive neurology evaluation.   She returned to the ER about 2 weeks ago with a hypoglycemic episode.  She was seen by her cardiologist on 8/9; there was a recommendation for a repeat, limited echo to assess the pericardial effusion and her chest pain was thought to be related to the pericarditis as well  as MSK-related pain.      Hospital Course by Problem:   Anxiety -lexapro started  Chest pain/elevated troponin. - Atypical. patient had multiple cardiac workups. Recent cardiac cath negative. Elevated troponin likely related to renal insufficiency. Chart review indicates history of flat troponins. EKG with no acute changes. No ACS type symptoms.   -echo: stable -has follow with rheumatology  acute diastolic heart failure. BNP slightly elevated.chest x-ray -improved with IV Lasix in the emergency department. Diuresing well -on continuous O2 at home -will start low dose lasix with close outpatient follow up  CKD stage III - Recent renal ultrasound with normal limits. Creatinine 1.45 on admission. -will need close outpatient follow up  Diabetes.  -Recent hemoglobin A1c 5.6 -resume home meds  Hypertension.  -Continue metoprolol  Hypothyroidism. Home medications include Synthroid. Last admission TSH 6.7, free T4 1 0.21. There were no changes in her dosage -Continue home Synthroid -Recheck TSH  Medical Consultants:   psych   Discharge Exam:   Vitals:   12/23/17 1200 12/23/17 1414  BP:    Pulse:    Resp: 20   Temp:    SpO2:  100%   Vitals:   12/23/17 0800 12/23/17 1047 12/23/17 1200 12/23/17 1414  BP:  120/77    Pulse: 88 89    Resp: (!) 27 (!) 23 20   Temp:  (!) 97.4 F (36.3 C)    TempSrc:  Oral    SpO2: 99% 91%  100%  Weight:  Height:        General exam: Appears calm and comfortable. Feels better and wants to go home    The results of significant diagnostics from this hospitalization (including imaging, microbiology, ancillary and laboratory) are listed below for reference.     Procedures and Diagnostic Studies:   Dg Chest 2 View  Result Date: 12/21/2017 CLINICAL DATA:  Chest pain and vomiting EXAM: CHEST - 2 VIEW COMPARISON:  12/07/2017 CT of the chest FINDINGS: Cardiac shadow is mildly enlarged. Aortic calcifications are again seen.  Patchy changes in the bases are again seen similar to that noted on prior CT examination. No new focal infiltrate or sizable effusion is seen. No bony abnormality is noted. IMPRESSION: Stable bibasilar changes consistent with the prior CT. Electronically Signed   By: Inez Catalina M.D.   On: 12/21/2017 22:14   Ct Angio Chest Pe W And/or Wo Contrast  Result Date: 12/22/2017 CLINICAL DATA:  Sharp chest pain starting last night. Pain on palpation. Shortness of breath. EXAM: CT ANGIOGRAPHY CHEST WITH CONTRAST TECHNIQUE: Multidetector CT imaging of the chest was performed using the standard protocol during bolus administration of intravenous contrast. Multiplanar CT image reconstructions and MIPs were obtained to evaluate the vascular anatomy. CONTRAST:  120mL ISOVUE-370 IOPAMIDOL (ISOVUE-370) INJECTION 76% COMPARISON:  12/07/2017 FINDINGS: Cardiovascular: There is good opacification of the central and segmental pulmonary arteries. No focal filling defects. No evidence of significant pulmonary embolus. Normal caliber thoracic aorta with scattered calcification. Normal heart size. No pericardial effusions. Mediastinum/Nodes: No significant lymphadenopathy in the chest. Scattered lymph nodes are not pathologically enlarged. Esophagus is not significantly dilated but there is an air-fluid level with fluid in the esophagus. This may be due to reflux or dysmotility. Distal obstructing lesion is not identified. Similar appearance to previous study. Right adrenal gland nodule again demonstrated. Lungs/Pleura: Diffuse airspace disease throughout the lungs, most prominent in the bases. Airways are patent with mild bronchiectasis. No focal consolidation. No pleural effusions. No pneumothorax. Similar appearance to previous study. Upper Abdomen: No acute changes identified. Musculoskeletal: No chest wall abnormality. No acute or significant osseous findings. Review of the MIP images confirms the above findings. IMPRESSION: 1. No  evidence of significant pulmonary embolus. 2. Diffuse airspace disease throughout the lungs, most prominent in the bases. This could represent pneumonia or edema. Similar appearance to previous study. 3. Air-fluid level in the esophagus may be due to reflux or dysmotility. Aortic Atherosclerosis (ICD10-I70.0). Electronically Signed   By: Lucienne Capers M.D.   On: 12/22/2017 04:21     Labs:   Basic Metabolic Panel: Recent Labs  Lab 12/21/17 2142  NA 139  K 3.3*  CL 105  CO2 24  GLUCOSE 84  BUN 7*  CREATININE 1.45*  CALCIUM 8.7*   GFR Estimated Creatinine Clearance: 47.4 mL/min (A) (by C-G formula based on SCr of 1.45 mg/dL (H)). Liver Function Tests: Recent Labs  Lab 12/21/17 2142  AST 58*  ALT 34  ALKPHOS 61  BILITOT 0.8  PROT 7.7  ALBUMIN 3.0*   Recent Labs  Lab 12/21/17 2142  LIPASE 51   No results for input(s): AMMONIA in the last 168 hours. Coagulation profile No results for input(s): INR, PROTIME in the last 168 hours.  CBC: Recent Labs  Lab 12/21/17 2142  WBC 3.8*  HGB 11.3*  HCT 35.9*  MCV 76.5*  PLT 289   Cardiac Enzymes: Recent Labs  Lab 12/22/17 0724 12/22/17 0919 12/22/17 1239  TROPONINI 0.04* 0.04* 0.04*   BNP:  Invalid input(s): POCBNP CBG: Recent Labs  Lab 12/22/17 1214 12/22/17 1712 12/22/17 2141 12/23/17 0745 12/23/17 1203  GLUCAP 80 75 100* 80 101*   D-Dimer No results for input(s): DDIMER in the last 72 hours. Hgb A1c No results for input(s): HGBA1C in the last 72 hours. Lipid Profile No results for input(s): CHOL, HDL, LDLCALC, TRIG, CHOLHDL, LDLDIRECT in the last 72 hours. Thyroid function studies Recent Labs    12/22/17 0726  TSH 11.593*   Anemia work up No results for input(s): VITAMINB12, FOLATE, FERRITIN, TIBC, IRON, RETICCTPCT in the last 72 hours. Microbiology Recent Results (from the past 240 hour(s))  MRSA PCR Screening     Status: None   Collection Time: 12/22/17  8:41 AM  Result Value Ref Range  Status   MRSA by PCR NEGATIVE NEGATIVE Final    Comment:        The GeneXpert MRSA Assay (FDA approved for NASAL specimens only), is one component of a comprehensive MRSA colonization surveillance program. It is not intended to diagnose MRSA infection nor to guide or monitor treatment for MRSA infections. Performed at [facility], [place] [address], [place], [place] [address]      Discharge Instructions:   Discharge Instructions    Diet - low sodium heart healthy   Complete by:  As directed    Discharge instructions   Complete by:  As directed    BMP 1 week   Increase activity slowly   Complete by:  As directed      Allergies as of 12/23/2017   No Known Allergies     Medication List    TAKE these medications   ACCU-CHEK AVIVA PLUS test strip Generic drug:  glucose blood TEST BLOD SUGAR TWICE DAILY AND LANCETS TWICE DAILY   ACCU-CHEK SOFTCLIX LANCETS lancets USE TO CHECK BLOOD SUGAR TWICE A DAY   acetaminophen 325 MG tablet Commonly known as:  TYLENOL Take 2 tablets (650 mg total) by mouth every 6 (six) hours as needed for mild pain (or Fever >/= 101).   albuterol 108 (90 Base) MCG/ACT inhaler Commonly known as:  PROVENTIL HFA;VENTOLIN HFA Inhale 2 puffs into the lungs every 4 (four) hours as needed for wheezing.   aspirin EC 81 MG tablet Take 1 tablet (81 mg total) by mouth daily. With food   budesonide-formoterol 160-4.5 MCG/ACT inhaler Commonly known as:  SYMBICORT Inhale 2 puffs into the lungs 2 (two) times daily.   colchicine 0.6 MG tablet Take 1 tablet (0.6 mg total) by mouth 2 (two) times daily.   escitalopram 10 MG tablet Commonly known as:  LEXAPRO Take 1 tablet (10 mg total) by mouth daily.   furosemide 20 MG tablet Commonly known as:  LASIX Take 1 tablet (20 mg total) by mouth daily.   latanoprost 0.005 % ophthalmic solution Commonly known as:  XALATAN USE 1 DROP IN BOTH EYES AT BEDTIME   levothyroxine 75 MCG tablet Commonly known  as:  SYNTHROID, LEVOTHROID Take 1 tablet (75 mcg total) by mouth daily before breakfast.   Melatonin 3 MG Tabs Take 1-2 tablets (3-6 mg total) by mouth at bedtime as needed (insomnia).   metoprolol tartrate 25 MG tablet Commonly known as:  LOPRESSOR Take 1.5 tablets (37.5 mg total) by mouth 2 (two) times daily.   OXYGEN Inhale 3 L into the lungs continuous.   pantoprazole 40 MG tablet Commonly known as:  PROTONIX Take 1 tablet (40 mg total) by mouth daily before breakfast.   potassium  chloride 10 MEQ tablet Commonly known as:  K-DUR,KLOR-CON Take 1 tablet (10 mEq total) by mouth daily.   rosuvastatin 40 MG tablet Commonly known as:  CRESTOR Take 1 tablet (40 mg total) by mouth daily. What changed:  when to take this   sitaGLIPtin 50 MG tablet Commonly known as:  JANUVIA Take 1 tablet (50 mg total) by mouth daily.      Follow-up Pamelia Center, MD Follow up in 1 week(s).   Specialty:  Internal Medicine Why:  BMP resume home health Contact information: [address] [place] [address] [phone]        Sueanne Margarita, MD .   Specialty:  Cardiology Contact information: [address] [place] [address] [phone]            Time coordinating discharge: 25 min  Signed:  Geradine Girt  [facility] 12/23/2017, 3:21 PM

## 2017-12-23 NOTE — Progress Notes (Signed)
Psych consult was done and starting few medication for that. Removed PIV access. Pt & Pt's son received discharge instructions and understood it. Pt took her all belongings. HS [employer]

## 2017-12-24 ENCOUNTER — Other Ambulatory Visit: Payer: Self-pay | Admitting: Internal Medicine

## 2017-12-24 ENCOUNTER — Telehealth: Payer: Self-pay

## 2017-12-24 NOTE — Telephone Encounter (Signed)
12/24/17  Transition Care Management Follow-up Telephone Call  ADMISSION DATE: 12/21/17  DISCHARGE DATE: 12/23/17   How have you been since you were released from the hospital? Feeling some better.   Do you understand why you were in the hospital? Yes    Do you understand the discharge instrcutions? Yes    Items Reviewed:  Medications reviewed: Patient states a [employer], broadcasting is supposed to come to her home and help her with her medications.    Allergies reviewed: NKDA  Dietary changes reviewed:Low sodium,Heart healthy,Carbohydrate modified,Reguar  Referrals reviewed:Hospital follow up appointment scheduled with Dr. Larose Kells  Functional Questionnaire:  Activities of Daily Living (ADLs): Patient states she has help at this time with her ADL's.  Any patient concerns? Patient states she is just overwhelmed with appointments right now and needs an aid to help her at home.Marland Kitchen   Confirmed importance and date/time of follow-up visits scheduled:Yes   Confirmed with patient if condition begins to worsen call PCP or go to the ER. Yes    Patient was given the office number and encouragred to call back with questions or concerns. Yes

## 2017-12-25 ENCOUNTER — Telehealth: Payer: Self-pay | Admitting: Internal Medicine

## 2017-12-25 ENCOUNTER — Other Ambulatory Visit: Payer: Self-pay

## 2017-12-25 DIAGNOSIS — E1149 Type 2 diabetes mellitus with other diabetic neurological complication: Secondary | ICD-10-CM | POA: Diagnosis not present

## 2017-12-25 DIAGNOSIS — N183 Chronic kidney disease, stage 3 (moderate): Secondary | ICD-10-CM | POA: Diagnosis not present

## 2017-12-25 DIAGNOSIS — R000 Tachycardia, unspecified: Secondary | ICD-10-CM | POA: Diagnosis not present

## 2017-12-25 DIAGNOSIS — Z7951 Long term (current) use of inhaled steroids: Secondary | ICD-10-CM | POA: Diagnosis not present

## 2017-12-25 DIAGNOSIS — J45909 Unspecified asthma, uncomplicated: Secondary | ICD-10-CM | POA: Diagnosis not present

## 2017-12-25 DIAGNOSIS — I5031 Acute diastolic (congestive) heart failure: Secondary | ICD-10-CM | POA: Diagnosis not present

## 2017-12-25 DIAGNOSIS — Z7982 Long term (current) use of aspirin: Secondary | ICD-10-CM | POA: Diagnosis not present

## 2017-12-25 DIAGNOSIS — E039 Hypothyroidism, unspecified: Secondary | ICD-10-CM | POA: Diagnosis not present

## 2017-12-25 DIAGNOSIS — Z96651 Presence of right artificial knee joint: Secondary | ICD-10-CM | POA: Diagnosis not present

## 2017-12-25 DIAGNOSIS — J9621 Acute and chronic respiratory failure with hypoxia: Secondary | ICD-10-CM | POA: Diagnosis not present

## 2017-12-25 DIAGNOSIS — Z96652 Presence of left artificial knee joint: Secondary | ICD-10-CM | POA: Diagnosis not present

## 2017-12-25 DIAGNOSIS — K219 Gastro-esophageal reflux disease without esophagitis: Secondary | ICD-10-CM | POA: Diagnosis not present

## 2017-12-25 DIAGNOSIS — I130 Hypertensive heart and chronic kidney disease with heart failure and stage 1 through stage 4 chronic kidney disease, or unspecified chronic kidney disease: Secondary | ICD-10-CM | POA: Diagnosis not present

## 2017-12-25 DIAGNOSIS — Z9981 Dependence on supplemental oxygen: Secondary | ICD-10-CM | POA: Diagnosis not present

## 2017-12-25 DIAGNOSIS — E1122 Type 2 diabetes mellitus with diabetic chronic kidney disease: Secondary | ICD-10-CM | POA: Diagnosis not present

## 2017-12-25 DIAGNOSIS — Z794 Long term (current) use of insulin: Secondary | ICD-10-CM | POA: Diagnosis not present

## 2017-12-25 NOTE — Patient Outreach (Signed)
[facility]) Care Management  12/25/2017  [name] [birth year] [id number]   67 year old with hsitory of Pericarditis, OSA, asthma, chornic hypoxia respiratory failure, HTN, DM.  Client in hospital 8/12-8/14 with chest pain.  7/9-7/12/19 for tachycardia 6/10-6/15 with acute respiratory failure with hypoxia.  RNCM called to follow up. Client reports her home health nurse is present. RNCM has a scheduled home visit with client next week.  Plan: home visit next week.  Thea Silversmith, RN, MSN, [place] Coordinator Cell: [phone]

## 2017-12-25 NOTE — Telephone Encounter (Signed)
Copied from [place] [id number]. Topic: Quick Communication - See Telephone Encounter >> Dec 25, 2017  5:10 PM Percell Belt A wrote: CRM for notification. See Telephone encounter for: 12/25/17.  Monica with  Nanine Means [phone] Need Verbal for PT  2 week 2 With re certification

## 2017-12-27 ENCOUNTER — Encounter (HOSPITAL_COMMUNITY): Payer: Self-pay | Admitting: Emergency Medicine

## 2017-12-27 ENCOUNTER — Inpatient Hospital Stay (HOSPITAL_COMMUNITY)
Admission: EM | Admit: 2017-12-27 | Discharge: 2017-12-31 | DRG: 392 | Disposition: A | Discharge status: Skilled Nursing Facility | Payer: Medicare Other | Attending: Internal Medicine | Admitting: Internal Medicine

## 2017-12-27 ENCOUNTER — Emergency Department (HOSPITAL_COMMUNITY): Payer: Medicare Other

## 2017-12-27 ENCOUNTER — Other Ambulatory Visit: Payer: Self-pay

## 2017-12-27 DIAGNOSIS — Z7982 Long term (current) use of aspirin: Secondary | ICD-10-CM

## 2017-12-27 DIAGNOSIS — R110 Nausea: Secondary | ICD-10-CM

## 2017-12-27 DIAGNOSIS — F329 Major depressive disorder, single episode, unspecified: Secondary | ICD-10-CM | POA: Diagnosis not present

## 2017-12-27 DIAGNOSIS — R2681 Unsteadiness on feet: Secondary | ICD-10-CM | POA: Diagnosis not present

## 2017-12-27 DIAGNOSIS — G4733 Obstructive sleep apnea (adult) (pediatric): Secondary | ICD-10-CM | POA: Diagnosis not present

## 2017-12-27 DIAGNOSIS — R531 Weakness: Secondary | ICD-10-CM | POA: Diagnosis not present

## 2017-12-27 DIAGNOSIS — E039 Hypothyroidism, unspecified: Secondary | ICD-10-CM | POA: Diagnosis present

## 2017-12-27 DIAGNOSIS — E1122 Type 2 diabetes mellitus with diabetic chronic kidney disease: Secondary | ICD-10-CM | POA: Diagnosis not present

## 2017-12-27 DIAGNOSIS — Z79899 Other long term (current) drug therapy: Secondary | ICD-10-CM

## 2017-12-27 DIAGNOSIS — I5032 Chronic diastolic (congestive) heart failure: Secondary | ICD-10-CM | POA: Diagnosis not present

## 2017-12-27 DIAGNOSIS — E860 Dehydration: Secondary | ICD-10-CM | POA: Diagnosis present

## 2017-12-27 DIAGNOSIS — G4700 Insomnia, unspecified: Secondary | ICD-10-CM | POA: Diagnosis not present

## 2017-12-27 DIAGNOSIS — R279 Unspecified lack of coordination: Secondary | ICD-10-CM | POA: Diagnosis not present

## 2017-12-27 DIAGNOSIS — J9611 Chronic respiratory failure with hypoxia: Secondary | ICD-10-CM | POA: Diagnosis present

## 2017-12-27 DIAGNOSIS — M6281 Muscle weakness (generalized): Secondary | ICD-10-CM | POA: Diagnosis not present

## 2017-12-27 DIAGNOSIS — K2970 Gastritis, unspecified, without bleeding: Secondary | ICD-10-CM | POA: Diagnosis not present

## 2017-12-27 DIAGNOSIS — E669 Obesity, unspecified: Secondary | ICD-10-CM | POA: Diagnosis not present

## 2017-12-27 DIAGNOSIS — R488 Other symbolic dysfunctions: Secondary | ICD-10-CM | POA: Diagnosis not present

## 2017-12-27 DIAGNOSIS — R079 Chest pain, unspecified: Secondary | ICD-10-CM | POA: Diagnosis not present

## 2017-12-27 DIAGNOSIS — I130 Hypertensive heart and chronic kidney disease with heart failure and stage 1 through stage 4 chronic kidney disease, or unspecified chronic kidney disease: Secondary | ICD-10-CM | POA: Diagnosis present

## 2017-12-27 DIAGNOSIS — Z6833 Body mass index (BMI) 33.0-33.9, adult: Secondary | ICD-10-CM

## 2017-12-27 DIAGNOSIS — K5730 Diverticulosis of large intestine without perforation or abscess without bleeding: Secondary | ICD-10-CM | POA: Diagnosis not present

## 2017-12-27 DIAGNOSIS — R1010 Upper abdominal pain, unspecified: Secondary | ICD-10-CM

## 2017-12-27 DIAGNOSIS — E119 Type 2 diabetes mellitus without complications: Secondary | ICD-10-CM

## 2017-12-27 DIAGNOSIS — Z833 Family history of diabetes mellitus: Secondary | ICD-10-CM

## 2017-12-27 DIAGNOSIS — R918 Other nonspecific abnormal finding of lung field: Secondary | ICD-10-CM | POA: Diagnosis not present

## 2017-12-27 DIAGNOSIS — R160 Hepatomegaly, not elsewhere classified: Secondary | ICD-10-CM | POA: Diagnosis present

## 2017-12-27 DIAGNOSIS — K209 Esophagitis, unspecified: Secondary | ICD-10-CM | POA: Diagnosis present

## 2017-12-27 DIAGNOSIS — N183 Chronic kidney disease, stage 3 (moderate): Secondary | ICD-10-CM | POA: Diagnosis present

## 2017-12-27 DIAGNOSIS — E11649 Type 2 diabetes mellitus with hypoglycemia without coma: Secondary | ICD-10-CM | POA: Diagnosis present

## 2017-12-27 DIAGNOSIS — N179 Acute kidney failure, unspecified: Secondary | ICD-10-CM | POA: Diagnosis not present

## 2017-12-27 DIAGNOSIS — J45909 Unspecified asthma, uncomplicated: Secondary | ICD-10-CM | POA: Diagnosis not present

## 2017-12-27 DIAGNOSIS — K760 Fatty (change of) liver, not elsewhere classified: Secondary | ICD-10-CM | POA: Diagnosis present

## 2017-12-27 DIAGNOSIS — Z96653 Presence of artificial knee joint, bilateral: Secondary | ICD-10-CM | POA: Diagnosis present

## 2017-12-27 DIAGNOSIS — I10 Essential (primary) hypertension: Secondary | ICD-10-CM | POA: Diagnosis not present

## 2017-12-27 DIAGNOSIS — R1319 Other dysphagia: Secondary | ICD-10-CM | POA: Diagnosis not present

## 2017-12-27 DIAGNOSIS — K224 Dyskinesia of esophagus: Secondary | ICD-10-CM | POA: Diagnosis not present

## 2017-12-27 DIAGNOSIS — K228 Other specified diseases of esophagus: Secondary | ICD-10-CM | POA: Diagnosis not present

## 2017-12-27 DIAGNOSIS — I110 Hypertensive heart disease with heart failure: Secondary | ICD-10-CM | POA: Diagnosis not present

## 2017-12-27 DIAGNOSIS — Z9981 Dependence on supplemental oxygen: Secondary | ICD-10-CM

## 2017-12-27 DIAGNOSIS — R1084 Generalized abdominal pain: Secondary | ICD-10-CM | POA: Diagnosis not present

## 2017-12-27 DIAGNOSIS — E1143 Type 2 diabetes mellitus with diabetic autonomic (poly)neuropathy: Secondary | ICD-10-CM | POA: Diagnosis not present

## 2017-12-27 DIAGNOSIS — R278 Other lack of coordination: Secondary | ICD-10-CM | POA: Diagnosis not present

## 2017-12-27 DIAGNOSIS — R112 Nausea with vomiting, unspecified: Secondary | ICD-10-CM | POA: Diagnosis not present

## 2017-12-27 DIAGNOSIS — R1314 Dysphagia, pharyngoesophageal phase: Secondary | ICD-10-CM | POA: Diagnosis present

## 2017-12-27 DIAGNOSIS — K210 Gastro-esophageal reflux disease with esophagitis: Secondary | ICD-10-CM | POA: Diagnosis not present

## 2017-12-27 DIAGNOSIS — E785 Hyperlipidemia, unspecified: Secondary | ICD-10-CM | POA: Diagnosis not present

## 2017-12-27 DIAGNOSIS — B9681 Helicobacter pylori [H. pylori] as the cause of diseases classified elsewhere: Secondary | ICD-10-CM | POA: Diagnosis not present

## 2017-12-27 DIAGNOSIS — D509 Iron deficiency anemia, unspecified: Secondary | ICD-10-CM | POA: Diagnosis not present

## 2017-12-27 DIAGNOSIS — K2950 Unspecified chronic gastritis without bleeding: Secondary | ICD-10-CM | POA: Diagnosis not present

## 2017-12-27 DIAGNOSIS — Z87891 Personal history of nicotine dependence: Secondary | ICD-10-CM

## 2017-12-27 DIAGNOSIS — Z79890 Hormone replacement therapy (postmenopausal): Secondary | ICD-10-CM

## 2017-12-27 DIAGNOSIS — K3189 Other diseases of stomach and duodenum: Secondary | ICD-10-CM | POA: Diagnosis not present

## 2017-12-27 DIAGNOSIS — K219 Gastro-esophageal reflux disease without esophagitis: Secondary | ICD-10-CM | POA: Diagnosis not present

## 2017-12-27 DIAGNOSIS — Z794 Long term (current) use of insulin: Secondary | ICD-10-CM

## 2017-12-27 DIAGNOSIS — K449 Diaphragmatic hernia without obstruction or gangrene: Secondary | ICD-10-CM | POA: Diagnosis not present

## 2017-12-27 DIAGNOSIS — I509 Heart failure, unspecified: Secondary | ICD-10-CM | POA: Diagnosis not present

## 2017-12-27 DIAGNOSIS — Z743 Need for continuous supervision: Secondary | ICD-10-CM | POA: Diagnosis not present

## 2017-12-27 DIAGNOSIS — R634 Abnormal weight loss: Secondary | ICD-10-CM | POA: Diagnosis present

## 2017-12-27 DIAGNOSIS — Z7951 Long term (current) use of inhaled steroids: Secondary | ICD-10-CM

## 2017-12-27 DIAGNOSIS — K920 Hematemesis: Secondary | ICD-10-CM | POA: Diagnosis not present

## 2017-12-27 DIAGNOSIS — R1310 Dysphagia, unspecified: Secondary | ICD-10-CM | POA: Diagnosis not present

## 2017-12-27 DIAGNOSIS — Z8249 Family history of ischemic heart disease and other diseases of the circulatory system: Secondary | ICD-10-CM

## 2017-12-27 DIAGNOSIS — K208 Other esophagitis: Secondary | ICD-10-CM | POA: Diagnosis not present

## 2017-12-27 LAB — CBC
HCT: 37.8 % (ref 36.0–46.0)
HEMOGLOBIN: 11.5 g/dL — AB (ref 12.0–15.0)
MCH: 23.7 pg — AB (ref 26.0–34.0)
MCHC: 30.4 g/dL (ref 30.0–36.0)
MCV: 77.9 fL — ABNORMAL LOW (ref 78.0–100.0)
PLATELETS: 259 10*3/uL (ref 150–400)
RBC: 4.85 MIL/uL (ref 3.87–5.11)
RDW: 22.5 % — ABNORMAL HIGH (ref 11.5–15.5)
WBC: 4.4 10*3/uL (ref 4.0–10.5)

## 2017-12-27 LAB — HEPATIC FUNCTION PANEL
ALT: 62 U/L — AB (ref 0–44)
AST: 95 U/L — AB (ref 15–41)
Albumin: 3.1 g/dL — ABNORMAL LOW (ref 3.5–5.0)
Alkaline Phosphatase: 64 U/L (ref 38–126)
BILIRUBIN DIRECT: 0.3 mg/dL — AB (ref 0.0–0.2)
Indirect Bilirubin: 1.1 mg/dL — ABNORMAL HIGH (ref 0.3–0.9)
TOTAL PROTEIN: 8.3 g/dL — AB (ref 6.5–8.1)
Total Bilirubin: 1.4 mg/dL — ABNORMAL HIGH (ref 0.3–1.2)

## 2017-12-27 LAB — DIFFERENTIAL
ABS IMMATURE GRANULOCYTES: 0.1 10*3/uL (ref 0.0–0.1)
Basophils Absolute: 0 10*3/uL (ref 0.0–0.1)
Basophils Relative: 1 %
Eosinophils Absolute: 0 10*3/uL (ref 0.0–0.7)
Eosinophils Relative: 1 %
IMMATURE GRANULOCYTES: 1 %
LYMPHS PCT: 17 %
Lymphs Abs: 0.7 10*3/uL (ref 0.7–4.0)
Monocytes Absolute: 0.7 10*3/uL (ref 0.1–1.0)
Monocytes Relative: 16 %
NEUTROS ABS: 2.8 10*3/uL (ref 1.7–7.7)
Neutrophils Relative %: 64 %

## 2017-12-27 LAB — CBG MONITORING, ED
GLUCOSE-CAPILLARY: 72 mg/dL (ref 70–99)
GLUCOSE-CAPILLARY: 62 mg/dL — AB (ref 70–99)
GLUCOSE-CAPILLARY: 50 mg/dL — AB (ref 70–99)
Glucose-Capillary: 37 mg/dL — CL (ref 70–99)

## 2017-12-27 LAB — BASIC METABOLIC PANEL
ANION GAP: 15 (ref 5–15)
BUN: 24 mg/dL — ABNORMAL HIGH (ref 8–23)
CALCIUM: 9 mg/dL (ref 8.9–10.3)
CO2: 21 mmol/L — AB (ref 22–32)
CREATININE: 2.17 mg/dL — AB (ref 0.44–1.00)
Chloride: 101 mmol/L (ref 98–111)
GFR, EST AFRICAN AMERICAN: 26 mL/min — AB (ref >60)
GFR, EST NON AFRICAN AMERICAN: 22 mL/min — AB (ref >60)
GLUCOSE: 59 mg/dL — AB (ref 70–99)
Potassium: 4.3 mmol/L (ref 3.5–5.1)
Sodium: 137 mmol/L (ref 135–145)

## 2017-12-27 LAB — GLUCOSE, CAPILLARY
Glucose-Capillary: 157 mg/dL — ABNORMAL HIGH (ref 70–99)
Glucose-Capillary: 37 mg/dL — CL (ref 70–99)

## 2017-12-27 LAB — I-STAT TROPONIN, ED: Troponin i, poc: 0.06 ng/mL (ref 0.00–0.08)

## 2017-12-27 LAB — BRAIN NATRIURETIC PEPTIDE: B Natriuretic Peptide: 278.7 pg/mL — ABNORMAL HIGH (ref 0.0–100.0)

## 2017-12-27 LAB — LIPASE, BLOOD: Lipase: 66 U/L — ABNORMAL HIGH (ref 11–51)

## 2017-12-27 MED ORDER — DEXTROSE-NACL 5-0.9 % IV SOLN
INTRAVENOUS | Status: AC
  Administered 2017-12-27: 20:00:00 via INTRAVENOUS

## 2017-12-27 MED ORDER — DEXTROSE 50 % IV SOLN
INTRAVENOUS | Status: AC
  Administered 2017-12-27: 50 mL
  Filled 2017-12-27: qty 50

## 2017-12-27 MED ORDER — DEXTROSE 50 % IV SOLN
25.0000 mL | Freq: Once | INTRAVENOUS | Status: AC
  Administered 2017-12-27: 25 mL via INTRAVENOUS
  Filled 2017-12-27: qty 50

## 2017-12-27 MED ORDER — ONDANSETRON 4 MG PO TBDP
4.0000 mg | ORAL_TABLET | Freq: Once | ORAL | Status: AC
  Administered 2017-12-27: 4 mg via ORAL
  Filled 2017-12-27: qty 1

## 2017-12-27 MED ORDER — HEPARIN SODIUM (PORCINE) 5000 UNIT/ML IJ SOLN
5000.0000 [IU] | Freq: Three times a day (TID) | INTRAMUSCULAR | Status: DC
  Administered 2017-12-27 – 2017-12-30 (×7): 5000 [IU] via SUBCUTANEOUS
  Filled 2017-12-27 (×7): qty 1

## 2017-12-27 MED ORDER — DEXTROSE 50 % IV SOLN
1.0000 | Freq: Once | INTRAVENOUS | Status: AC
  Administered 2017-12-27: 50 mL via INTRAVENOUS
  Filled 2017-12-27: qty 50

## 2017-12-27 MED ORDER — LATANOPROST 0.005 % OP SOLN
1.0000 [drp] | Freq: Every day | OPHTHALMIC | Status: DC
  Administered 2017-12-28 – 2017-12-30 (×3): 1 [drp] via OPHTHALMIC
  Filled 2017-12-27: qty 2.5

## 2017-12-27 MED ORDER — ALBUTEROL SULFATE (2.5 MG/3ML) 0.083% IN NEBU: 2.5000 mg | INHALATION_SOLUTION | RESPIRATORY_TRACT | Status: DC | PRN

## 2017-12-27 MED ORDER — FLUTICASONE FUROATE-VILANTEROL 200-25 MCG/INH IN AEPB
1.0000 | INHALATION_SPRAY | Freq: Every day | RESPIRATORY_TRACT | Status: DC
  Administered 2017-12-28 – 2017-12-30 (×3): 1 via RESPIRATORY_TRACT
  Filled 2017-12-27: qty 28

## 2017-12-27 MED ORDER — METOPROLOL TARTRATE 25 MG PO TABS
37.5000 mg | ORAL_TABLET | Freq: Two times a day (BID) | ORAL | Status: DC
  Administered 2017-12-27 – 2017-12-30 (×7): 37.5 mg via ORAL
  Filled 2017-12-27 (×7): qty 1

## 2017-12-27 MED ORDER — PANTOPRAZOLE SODIUM 40 MG PO TBEC
40.0000 mg | DELAYED_RELEASE_TABLET | Freq: Every day | ORAL | Status: DC
  Administered 2017-12-28 – 2017-12-30 (×3): 40 mg via ORAL
  Filled 2017-12-27 (×3): qty 1

## 2017-12-27 MED ORDER — LEVOTHYROXINE SODIUM 75 MCG PO TABS
75.0000 ug | ORAL_TABLET | Freq: Every day | ORAL | Status: DC
  Administered 2017-12-28 – 2017-12-30 (×3): 75 ug via ORAL
  Filled 2017-12-27 (×3): qty 1

## 2017-12-27 MED ORDER — GLUCOSE 40 % PO GEL
1.0000 | Freq: Once | ORAL | Status: AC
  Administered 2017-12-27: 37.5 g via ORAL
  Filled 2017-12-27: qty 1

## 2017-12-27 MED ORDER — ASPIRIN EC 81 MG PO TBEC
81.0000 mg | DELAYED_RELEASE_TABLET | Freq: Every day | ORAL | Status: DC
  Administered 2017-12-27 – 2017-12-30 (×4): 81 mg via ORAL
  Filled 2017-12-27 (×4): qty 1

## 2017-12-27 MED ORDER — SODIUM CHLORIDE 0.9% FLUSH
3.0000 mL | Freq: Two times a day (BID) | INTRAVENOUS | Status: DC
  Administered 2017-12-28 – 2017-12-30 (×3): 3 mL via INTRAVENOUS

## 2017-12-27 MED ORDER — ONDANSETRON 4 MG PO TBDP: 4.0000 mg | ORAL_TABLET | Freq: Three times a day (TID) | ORAL | Status: DC | PRN

## 2017-12-27 NOTE — Progress Notes (Signed)
CPAP set up for pt per order. Pt unable to tolerate nasal mask at this time, also states she feels nauseated. Advised pt to have RN notify for RT if she decides she wants to try mask again. RT will continue to monitor.

## 2017-12-27 NOTE — ED Notes (Signed)
Patient transported to CT 

## 2017-12-27 NOTE — ED Provider Notes (Signed)
[facility] EMERGENCY DEPARTMENT Provider Note   CSN: [id number] Arrival date & time: 12/27/17  1019     History   Chief Complaint Chief Complaint  Patient presents with  . Abdominal Pain  . Chest Pain    HPI [name] is a 67 y.o. female.  Patient complains of upper abdominal pain.  Patient has been having nausea vomiting since last Wednesday when she left the hospital.  The history is provided by the patient. No language interpreter was used.  Abdominal Pain   This is a recurrent problem. The current episode started more than 2 days ago. The problem occurs constantly. The problem has not changed since onset.The pain is associated with an unknown factor. The pain is located in the generalized abdominal region. The quality of the pain is aching. The pain is at a severity of 5/10. The pain is moderate. Associated symptoms include vomiting. Pertinent negatives include diarrhea, frequency, hematuria and headaches.  Chest Pain   Associated symptoms include abdominal pain and vomiting. Pertinent negatives include no back pain, no cough and no headaches.  Pertinent negatives for past medical history include no seizures.    Past Medical History:  Diagnosis Date  . Anterolisthesis    Grade 1, L4-5  . Bronchospasm 05/28/2012  . CHEST PAIN 11/18/2007  . DEGENERATIVE JOINT DISEASE 10/06/2006  . DEPRESSION 09/26/2008  . DIABETES MELLITUS 10/06/2006  . Diverticulosis    [id number]  . GERD (gastroesophageal reflux disease) 07/25/2013  . HYPERLIPIDEMIA 01/11/2009  . HYPERTENSION 10/06/2006  . INSOMNIA 09/26/2008  . Internal hemorrhoids   . OBSTRUCTIVE SLEEP APNEA 06/23/2008   Severe OSA per sleep study 2010, Rx a CPAP  . Pain in joint, multiple sites 11/10/2006  . UNSPECIFIED ANEMIA 12/10/2009  . UTI (urinary tract infection) 11/2017    Patient Active Problem List   Diagnosis Date Noted  . Anxiety and depression   . Acute CHF (congestive heart failure) ([place])  12/22/2017  . Hypothyroidism 12/22/2017  . ILD (interstitial lung disease) ([place]) 11/25/2017  . Acute hypoxemic respiratory failure ([place]) 11/18/2017  . Tachycardia 11/17/2017  . ARF (acute renal failure) ([place]) 11/17/2017  . Elevated troponin 11/17/2017  . Chronic respiratory failure with hypoxia ([place]) 11/12/2017  . Shortness of breath   . Pericardial effusion   . Pain 10/19/2017  . Paresthesia 10/19/2017  . Weakness 10/19/2017  . Neck pain 10/19/2017  . PCP NOTES >>> 02/23/2015  . Nocturnal oxygen desaturation 01/02/2015  . Morbid obesity ([place]) 01/02/2015  . Asthma, chronic 01/02/2015  . GERD (gastroesophageal reflux disease) 07/25/2013  . DOE (dyspnea on exertion) 04/25/2013  . 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 Bronchospasm 05/28/2012  . Internal hemorrhoids without mention of complication 41/93/7902  . Annual physical exam 06/06/2011  . Anemia 12/10/2009  . SKIN LESION 08/06/2009  . Hyperlipidemia 01/11/2009  . Depression 09/26/2008  . INSOMNIA 09/26/2008  . Obstructive sleep apnea 06/23/2008  . OBSTRUCTIVE SLEEP APNEA 06/23/2008  . Chest pain 11/18/2007  . DM II (diabetes mellitus, type II), controlled ([place]) 10/06/2006  . Essential hypertension 10/06/2006  . Osteoarthritis 10/06/2006    Past Surgical History:  Procedure Laterality Date  . COLONOSCOPY  08/01/2011   Procedure: COLONOSCOPY;  Surgeon: Inda Castle, MD;  Location: [facility];  Service: Endoscopy;  Laterality: N/A;  . LEFT HEART CATH AND CORONARY ANGIOGRAPHY N/A 10/22/2017   Procedure: LEFT HEART CATH AND CORONARY ANGIOGRAPHY;  Surgeon: Jettie Booze, MD;  Location: [facility]  CV LAB;  Service: Cardiovascular;  Laterality: N/A;  . 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 Right knee replacement  2005     OB History   None      Home Medications    Prior to Admission medications   Medication Sig Start Date End Date Taking? Authorizing Provider    acetaminophen (TYLENOL) 325 MG tablet Take 2 tablets (650 mg total) by mouth every 6 (six) hours as needed for mild pain (or Fever >/= 101). Patient taking differently: Take 325 mg by mouth every 6 (six) hours as needed for mild pain (or Fever >/= 101).  11/20/17  Yes Emokpae, Courage, MD  albuterol (PROVENTIL HFA;VENTOLIN HFA) 108 (90 Base) MCG/ACT inhaler Inhale 2 puffs into the lungs every 4 (four) hours as needed for wheezing. 11/20/17 01/18/29 Yes Emokpae, Courage, MD  aspirin EC 81 MG tablet Take 1 tablet (81 mg total) by mouth daily. With food 11/20/17  Yes Emokpae, Courage, MD  budesonide-formoterol (SYMBICORT) 160-4.5 MCG/ACT inhaler Inhale 2 puffs into the lungs 2 (two) times daily. 11/20/17  Yes Emokpae, Courage, MD  colchicine 0.6 MG tablet Take 1 tablet (0.6 mg total) by mouth 2 (two) times daily. 11/20/17  Yes Emokpae, Courage, MD  latanoprost (XALATAN) 0.005 % ophthalmic solution USE 1 DROP IN BOTH EYES AT BEDTIME Patient taking differently: Place 1 drop into both eyes at bedtime. USE 1 DROP IN BOTH EYES AT BEDTIME 11/20/17  Yes Emokpae, Courage, MD  levothyroxine (SYNTHROID, LEVOTHROID) 75 MCG tablet Take 1 tablet (75 mcg total) by mouth daily before breakfast. 11/20/17  Yes Emokpae, Courage, MD  metoprolol tartrate (LOPRESSOR) 25 MG tablet Take 1.5 tablets (37.5 mg total) by mouth 2 (two) times daily. 12/18/17  Yes Almyra Deforest, PA  OVER THE COUNTER MEDICATION CPAP   Yes [provider]  OXYGEN Inhale 2 L into the lungs continuous.    Yes [provider]  pantoprazole (PROTONIX) 40 MG tablet Take 1 tablet (40 mg total) by mouth daily before breakfast. 11/02/17  Yes Paz, Alda Berthold, MD  potassium chloride (K-DUR,KLOR-CON) 10 MEQ tablet Take 1 tablet (10 mEq total) by mouth daily. 11/20/17  Yes Emokpae, Courage, MD  rosuvastatin (CRESTOR) 40 MG tablet Take 1 tablet (40 mg total) by mouth daily. Patient taking differently: Take 40 mg by mouth every evening.  11/20/17  Yes Emokpae,  Courage, MD  sitaGLIPtin (JANUVIA) 50 MG tablet Take 1 tablet (50 mg total) by mouth daily. 12/04/17  Yes Renato Shin, MD  ACCU-CHEK AVIVA PLUS test strip TEST BLOD SUGAR TWICE DAILY AND LANCETS TWICE DAILY 10/15/17   Renato Shin, MD  ACCU-CHEK SOFTCLIX LANCETS lancets USE TO CHECK BLOOD SUGAR TWICE A DAY 10/05/17   Renato Shin, MD  escitalopram (LEXAPRO) 10 MG tablet Take 1 tablet (10 mg total) by mouth daily. 12/23/17   Geradine Girt, DO  furosemide (LASIX) 20 MG tablet Take 1 tablet (20 mg total) by mouth daily. 12/23/17 12/23/18  Geradine Girt, DO  Melatonin 3 MG TABS Take 1-2 tablets (3-6 mg total) by mouth at bedtime as needed (insomnia). 12/23/17   Geradine Girt, DO    Family History Family History  Problem Relation Age of Onset  . Asthma Mother   . Stroke Mother   . Diabetes Other        M, B, S  . Hypertension Sister        M, S,B  . Pancreatic cancer Brother   . Colon cancer Neg Hx   .  Prostate cancer Neg Hx   . Breast cancer Neg Hx     Social History Social History   Tobacco Use  . Smoking status: Former Smoker    Packs/day: 0.20    Years: 4.00    Pack years: 0.80    Types: Cigarettes    Last attempt to quit: 05/13/1995    Years since quitting: 22.6  . Smokeless tobacco: Never Used  Substance Use Topics  . Alcohol use: Not Currently  . Drug use: No     Allergies   Patient has no known allergies.   Review of Systems Review of Systems  Constitutional: Negative for appetite change and fatigue.  HENT: Negative for congestion, ear discharge and sinus pressure.   Eyes: Negative for discharge.  Respiratory: Negative for cough.   Cardiovascular: Positive for chest pain.  Gastrointestinal: Positive for abdominal pain and vomiting. Negative for diarrhea.  Genitourinary: Negative for frequency and hematuria.  Musculoskeletal: Negative for back pain.  Skin: Negative for rash.  Neurological: Negative for seizures and headaches.  Psychiatric/Behavioral:  Negative for hallucinations.     Physical Exam Updated Vital Signs BP (!) 149/97   Pulse 99   Temp 97.7 F (36.5 C) (Oral)   Resp (!) 24   SpO2 100%   Physical Exam  Constitutional: She is oriented to person, place, and time. She appears well-developed.  HENT:  Head: Normocephalic.  Eyes: Conjunctivae and EOM are normal. No scleral icterus.  Neck: Neck supple. No thyromegaly present.  Cardiovascular: Normal rate and regular rhythm. Exam reveals no gallop and no friction rub.  No murmur heard. Pulmonary/Chest: No stridor. She has no wheezes. She has no rales. She exhibits no tenderness.  Abdominal: She exhibits no distension. There is tenderness. There is no rebound.  Musculoskeletal: Normal range of motion. She exhibits no edema.  Lymphadenopathy:    She has no cervical adenopathy.  Neurological: She is oriented to person, place, and time. She exhibits normal muscle tone. Coordination normal.  Skin: No rash noted. No erythema.  Psychiatric: She has a normal mood and affect. Her behavior is normal.     ED Treatments / Results  Labs (all labs ordered are listed, but only abnormal results are displayed) Labs Reviewed  BASIC METABOLIC PANEL - Abnormal; Notable for the following components:      Result Value   CO2 21 (*)    Glucose, Bld 59 (*)    BUN 24 (*)    Creatinine, Ser 2.17 (*)    GFR calc non Af Amer 22 (*)    GFR calc Af Amer 26 (*)    All other components within normal limits  CBC - Abnormal; Notable for the following components:   Hemoglobin 11.5 (*)    MCV 77.9 (*)    MCH 23.7 (*)    RDW 22.5 (*)    All other components within normal limits  LIPASE, BLOOD - Abnormal; Notable for the following components:   Lipase 66 (*)    All other components within normal limits  HEPATIC FUNCTION PANEL - Abnormal; Notable for the following components:   Total Protein 8.3 (*)    Albumin 3.1 (*)    AST 95 (*)    ALT 62 (*)    Total Bilirubin 1.4 (*)    Bilirubin,  Direct 0.3 (*)    Indirect Bilirubin 1.1 (*)    All other components within normal limits  BRAIN NATRIURETIC PEPTIDE - Abnormal; Notable for the following components:   B Natriuretic  Peptide 278.7 (*)    All other components within normal limits  CBG MONITORING, ED - Abnormal; Notable for the following components:   Glucose-Capillary 37 (*)    All other components within normal limits  CBG MONITORING, ED - Abnormal; Notable for the following components:   Glucose-Capillary 62 (*)    All other components within normal limits  DIFFERENTIAL  I-STAT TROPONIN, ED  CBG MONITORING, ED  CBG MONITORING, ED    EKG EKG Interpretation  Date/Time:  Sunday December 27 2017 10:30:48 EDT Ventricular Rate:  105 PR Interval:  172 QRS Duration: 98 QT Interval:  382 QTC Calculation: 504 R Axis:   -14 Text Interpretation:  Sinus tachycardia Nonspecific T wave abnormality Abnormal ECG Confirmed by Milton Ferguson [phone]) on 12/27/2017 12:14:34 PM   Radiology Ct Abdomen Pelvis Wo Contrast  Result Date: 12/27/2017 CLINICAL DATA:  Diffuse abdominal pain for a few months, nausea and vomiting. History of hemorrhoids, urinary tract infection. EXAM: CT ABDOMEN AND PELVIS WITHOUT CONTRAST TECHNIQUE: Multidetector CT imaging of the abdomen and pelvis was performed following the standard protocol without IV contrast. COMPARISON:  CT chest December 22, 2017. Renal ultrasound November 18, 2017. CT lumbar spine April 28, 2006. FINDINGS: LOWER CHEST: Bilateral lower lobe bronchiectasis with confluent peripheral ground-glass opacities, subpleural sparing. Included heart size is normal. No pericardial effusion. Gas distended esophagus associated with reflux. HEPATOBILIARY: Layering density within gallbladder, likely vicarious excretion of contrast. No CT findings of acute cholecystitis. Normal gallbladder. PANCREAS: Normal. SPLEEN: Normal. ADRENALS/URINARY TRACT: Kidneys are orthotopic, demonstrating normal size and  morphology. Bilateral cortical scarring. 7 mm crescentic calcification along LEFT vascular pedicle could reflect aneurysm. 12 mm cyst lower pole RIGHT kidney. No nephrolithiasis, hydronephrosis; limited assessment for renal masses by nonenhanced CT. The unopacified ureters are normal in course and caliber. Urinary bladder is decompressed and unremarkable. Normal adrenal glands. STOMACH/BOWEL: Very small hiatal hernia. The stomach, small and large bowel are normal in course and caliber without inflammatory change. Mild colonic diverticulosis. Normal appendix. VASCULAR/LYMPHATIC: Aortoiliac vessels are normal in course and caliber. Mild calcific atherosclerosis. No lymphadenopathy by CT size criteria. REPRODUCTIVE: Normal. OTHER: No intraperitoneal free fluid or free air. MUSCULOSKELETAL: Non-acute. Severe pubic symphyseal osteoarthrosis. Severe lower lumbar facet arthropathy. Moderate hip osteoarthrosis. IMPRESSION: 1. Colonic diverticulosis without acute diverticulitis nor acute intra-abdominal/pelvic process. 2. Bibasilar ground-glass opacities with subpleural sparing associated with nonspecific interstitial pneumonia. Bilateral lower lobe bronchiectasis. Aortic Atherosclerosis (ICD10-I70.0). Electronically Signed   By: Elon Alas M.D.   On: 12/27/2017 18:00   Dg Chest 2 View  Result Date: 12/27/2017 CLINICAL DATA:  Generalized chest pain. EXAM: CHEST - 2 VIEW COMPARISON:  Chest CT dated December 22, 2017. Chest x-ray dated December 21, 2017. FINDINGS: The heart size and mediastinal contours are within normal limits. Normal pulmonary vascularity. Atherosclerotic calcification of the aortic arch. Patchy opacities at the lung bases are similar to prior studies. No new focal infiltrate. No pleural effusion or pneumothorax. No acute osseous abnormality. IMPRESSION: 1. Stable bibasilar patchy airspace disease, better evaluated on recent chest CT. Electronically Signed   By: Titus Dubin M.D.   On: 12/27/2017  11:18    Procedures Procedures (including critical care time)  Medications Ordered in ED Medications  dextrose 50 % solution 25 mL (25 mLs Intravenous Given 12/27/17 1401)  dextrose (GLUTOSE) 40 % oral gel 37.5 g (37.5 g Oral Given 12/27/17 1324)  ondansetron (ZOFRAN-ODT) disintegrating tablet 4 mg (4 mg Oral Given 12/27/17 1357)  dextrose 50 % solution 50 mL (  50 mLs Intravenous Given 12/27/17 1633)     Initial Impression / Assessment and Plan / ED Course  I have reviewed the triage vital signs and the nursing notes.  Pertinent labs & imaging results that were available during my care of the patient were reviewed by me and considered in my medical decision making (see chart for details).     CT of the abdomen unremarkable.  Patient has been hypoglycemic and she has mild AKI.  Patient with persistent vomiting.  She will be admitted to medicine and possible GI consult for this abdominal pain and vomiting  Final Clinical Impressions(s) / ED Diagnoses   Final diagnoses:  Pain of upper abdomen    ED Discharge Orders    None       Milton Ferguson, MD 12/27/17 Einar Crow

## 2017-12-27 NOTE — ED Notes (Signed)
Pt given apple sauce

## 2017-12-27 NOTE — ED Notes (Signed)
Two unsuccessful attempt at a PIV.

## 2017-12-27 NOTE — Progress Notes (Addendum)
Received patient from ED to room 5M12. A&Ox4. Denies pain or SOB. No distress noted. Upon obtaining initial VS and CBG O2 sat reading was 50% and CBG was 37. We also noticed that the patient's hands were very cold. I treated her low blood sugar and we warmed her hands. Repeat CBG is now 157 and O2 sat is now 99% on 2L [place]. Educated NT on warming up patient's hands prior to O2 and CBG readings for accuracy. Will continue to monitor.

## 2017-12-27 NOTE — ED Triage Notes (Signed)
Pt. Stated, IVE HAD STOMACH PAIN AND CHEST PAIN FOR AWHILE AND IT GOT WORSER LAST NIGHT.

## 2017-12-27 NOTE — ED Notes (Signed)
Called Dr Roderic Palau for orders since IV access is not available at this time. VO oral glucose and food.

## 2017-12-27 NOTE — H&P (Signed)
History and Physical   [name]:[id number] DOB: 1951-04-05 DOA: 12/27/2017  PCP: Colon Branch, MD  Chief Complaint: abdominal and chest pain  HPI:  This is a 67 year old woman with medical problems including obesity, chronic hypoxic respiratory failure with 3 L nasal cannula O2 at baseline, asthma, OSA on CPAP at bedtime, concern for preserved EF heart failure, history of pericarditis in June 2019, hypothyroidism, non-insulin-dependent diabetes, hypertension, chronic kidney disease baseline creatinine of 1.5, presenting with diffuse back and abdominal pain in addition to the acute on chronic nausea and vomiting.  Patient has had multiple hospitalizations this year, in June 2019 she underwent left heart cath which did not reveal obstructive coronary artery disease.  During prior admission in July, there was a barium swallow performed which was suggestive of an esophageal motility disorder.  Previously she was treated for pericarditis, most recent TTE on 12/22/2017 revealed trivial pericardial effusion.  Patient reports along with her son that she's had a multi-month history of abdominal and chest pain, poorly localized. She's had decreased by mouth intake with weight loss, despite still being obese her prior weight was over 300 pounds. Currently she weighs at under 230 pounds. She reports she has not been able to tolerate food and drink other than water, sites that most the time she has nausea and vomiting. She denies having recent upper GI endoscopy. She reports she feels like she "has fluid in her belly".  She lives locally by herself, spends most of her time watching television, enjoys life time and game shows. Former smoker, sites that formerly she used alcohol heavily, stopped this in December 2018. She ambulates with a wheelchair and walker. She requires ADL assistance, has a Chief Executive Officer visiting as well as home health PT. Of note, she was just discharged on 12/23/2017 where Lasix  was initiated.  ED Course: vital signs remarkable for tachycardia with a rate about 100, respiratory rate 24, systolic blood pressure 778E.  Diagnostics remarkable for hypoglycemia with blood sugar of 50 being her nadir. CMP remarkable for Cecilie Lowers of 2.2 up from her baseline. Microcytic anemia hemoglobin 11.5. BNP of 278. Troponin within normal limits. CT scan of the abdomen and pelvis revealed diverticulosis without acute diverticulitis or any acute intra-abdominal or pelvic processes.  Chest x-ray revealed stable bibasilar patchy airspace disease.  Patient was given 2 A of D50 in addition to by mouth dextrose. Hospital medicine consult further management.  Review of Systems: A complete ROS was obtained; pertinent positives negatives are denoted in the HPI. Otherwise, all systems are negative.   Past Medical History:  Diagnosis Date  . Anterolisthesis    Grade 1, L4-5  . Bronchospasm 05/28/2012  . CHEST PAIN 11/18/2007  . DEGENERATIVE JOINT DISEASE 10/06/2006  . DEPRESSION 09/26/2008  . DIABETES MELLITUS 10/06/2006  . Diverticulosis    [id number]  . GERD (gastroesophageal reflux disease) 07/25/2013  . HYPERLIPIDEMIA 01/11/2009  . HYPERTENSION 10/06/2006  . INSOMNIA 09/26/2008  . Internal hemorrhoids   . OBSTRUCTIVE SLEEP APNEA 06/23/2008   Severe OSA per sleep study 2010, Rx a CPAP  . Pain in joint, multiple sites 11/10/2006  . UNSPECIFIED ANEMIA 12/10/2009  . UTI (urinary tract infection) 11/2017   Social History   Socioeconomic History  . Marital status: Widowed    Spouse name: Not on file  . Number of children: 4  . Years of education: Not on file  . Highest education level: Not on file  Occupational History  . Occupation: disability  Employer: UNEMPLOYED  Social Needs  . Financial resource strain: Not on file  . Food insecurity:    Worry: Not on file    Inability: Not on file  . Transportation needs:    Medical: Not on file    Non-medical: Not on file  Tobacco Use  . Smoking  status: Former Smoker    Packs/day: 0.20    Years: 4.00    Pack years: 0.80    Types: Cigarettes    Last attempt to quit: 05/13/1995    Years since quitting: 22.6  . Smokeless tobacco: Never Used  Substance and Sexual Activity  . Alcohol use: Not Currently  . Drug use: No  . Sexual activity: Not Currently  Lifestyle  . Physical activity:    Days per week: Not on file    Minutes per session: Not on file  . Stress: Not on file  Relationships  . Social connections:    Talks on phone: Not on file    Gets together: Not on file    Attends religious service: Not on file    Active member of club or organization: Not on file    Attends meetings of clubs or organizations: Not on file    Relationship status: Not on file  . Intimate partner violence:    Fear of current or ex partner: Not on file    Emotionally abused: Not on file    Physically abused: Not on file    Forced sexual activity: Not on file  Other Topics Concern  . Not on file  Social History Narrative   Widow , lives by herself   Lost a son, 3 living    Lost husband   Family History  Problem Relation Age of Onset  . Asthma Mother   . Stroke Mother   . Diabetes Other        M, B, S  . Hypertension Sister        M, S,B  . Pancreatic cancer Brother   . Colon cancer Neg Hx   . Prostate cancer Neg Hx   . Breast cancer Neg Hx     Physical Exam: Vitals:   12/27/17 1500 12/27/17 1530 12/27/17 1800 12/27/17 1900  BP: (!) 150/99 (!) 149/94 (!) 143/96 (!) 149/97  Pulse: 97 99    Resp:  16 18 (!) 24  Temp:      TempSrc:      SpO2: 100% 100%     General: Appears calm and comfortable. Obese black woman. ENT: Grossly normal hearing, MMM. Cardiovascular: RRR. No M/R/G. No LE edema.  Respiratory: Breath sounds globally reduced. No wheezes or crackles. Normal respiratory effort.  On 3 L [place] O2. Abdomen: Soft, reports mild tenderness to deep palpation. Skin: No rash or induration seen on limited exam. Musculoskeletal:  Grossly normal tone BUE/BLE. Appropriate ROM.  Psychiatric: Grossly normal mood and affect. Neurologic: Moves all extremities in coordinated fashion.  I have personally reviewed the following labs, culture data, and imaging studies.  Assessment/Plan:  #Chronic nausea and vomiting #AKI on CKD Course: prior barium swallow suggestive of esophageal motility disorder, multi-week history of progressive weight loss (>70# in past year), nausea and vomiting; likely exacerbated with recent diuretic initiation. On admission, Cr of 2.2 (baseline ~1.5 suspected) A/P: will hydrate with D5NS at 100 cc q hr x 10 hrs then re-assess. NPO and consult GI in AM for their input on esophageal motility disorder / endoscopy consideration.  Trend BMP to ensure Cr improvement.  Random AM cortisol for completeness for chronic n/v work up.    #Other problems: -Chronic hypoxic respiratory failure and OSA: CPAP qhs, continue O2 support -Non-insulin dependent DM: hold home PO agent given hypoglycemia, update A1c -Microcytic anemia: stable, monitor -Heart failure, diastolic hx of: although BNP >200, clinically does not appear to be volume overloaded; holding diuretic -Hypothyroid: continue home thyroid supplement -Pericarditis, hx of: most recent TTE without significant effusion, will hold colchicine in event that is worsening N/V -Anxiety: most recently prescribed SSRI, pharmacy team reports she has not yet started, thus - not ordered -Diffuse airspace disease in b/l lungs, most prominent at bases: in past was thought that she may need VATS with bx to further evaluate - consider discussing with pulmonary  DVT prophylaxis: Subq hep Code Status: full code Disposition Plan: Anticipate D/C home in 2-5 days Consults called: none, anticipate GI in AM Admission status: admit to hospital medicine   Cheri Rous, MD [facility] Page:[phone]  If 7PM-7AM, please contact night-coverage [email or web address] Password  [email or web address]

## 2017-12-27 NOTE — ED Notes (Signed)
Purwick placed

## 2017-12-28 ENCOUNTER — Inpatient Hospital Stay (HOSPITAL_COMMUNITY): Payer: Medicare Other | Admitting: Anesthesiology

## 2017-12-28 ENCOUNTER — Encounter (HOSPITAL_COMMUNITY)
Admission: EM | Disposition: A | Discharge status: Skilled Nursing Facility | Payer: Self-pay | Source: Home / Self Care | Attending: Internal Medicine

## 2017-12-28 ENCOUNTER — Telehealth: Payer: Self-pay | Admitting: *Deleted

## 2017-12-28 ENCOUNTER — Other Ambulatory Visit (HOSPITAL_COMMUNITY): Payer: Medicare Other

## 2017-12-28 ENCOUNTER — Encounter (HOSPITAL_COMMUNITY): Payer: Self-pay

## 2017-12-28 DIAGNOSIS — K219 Gastro-esophageal reflux disease without esophagitis: Secondary | ICD-10-CM | POA: Diagnosis not present

## 2017-12-28 DIAGNOSIS — E11649 Type 2 diabetes mellitus with hypoglycemia without coma: Secondary | ICD-10-CM | POA: Diagnosis not present

## 2017-12-28 DIAGNOSIS — R112 Nausea with vomiting, unspecified: Secondary | ICD-10-CM

## 2017-12-28 DIAGNOSIS — K2970 Gastritis, unspecified, without bleeding: Secondary | ICD-10-CM

## 2017-12-28 DIAGNOSIS — K920 Hematemesis: Secondary | ICD-10-CM

## 2017-12-28 DIAGNOSIS — J9611 Chronic respiratory failure with hypoxia: Secondary | ICD-10-CM | POA: Diagnosis not present

## 2017-12-28 DIAGNOSIS — J45909 Unspecified asthma, uncomplicated: Secondary | ICD-10-CM | POA: Diagnosis not present

## 2017-12-28 DIAGNOSIS — K209 Esophagitis, unspecified: Secondary | ICD-10-CM | POA: Diagnosis not present

## 2017-12-28 DIAGNOSIS — R1084 Generalized abdominal pain: Secondary | ICD-10-CM

## 2017-12-28 DIAGNOSIS — K224 Dyskinesia of esophagus: Secondary | ICD-10-CM | POA: Diagnosis not present

## 2017-12-28 DIAGNOSIS — K228 Other specified diseases of esophagus: Secondary | ICD-10-CM | POA: Diagnosis not present

## 2017-12-28 DIAGNOSIS — I130 Hypertensive heart and chronic kidney disease with heart failure and stage 1 through stage 4 chronic kidney disease, or unspecified chronic kidney disease: Secondary | ICD-10-CM | POA: Diagnosis not present

## 2017-12-28 DIAGNOSIS — K210 Gastro-esophageal reflux disease with esophagitis: Secondary | ICD-10-CM

## 2017-12-28 DIAGNOSIS — G4700 Insomnia, unspecified: Secondary | ICD-10-CM | POA: Diagnosis not present

## 2017-12-28 DIAGNOSIS — R160 Hepatomegaly, not elsewhere classified: Secondary | ICD-10-CM | POA: Diagnosis not present

## 2017-12-28 DIAGNOSIS — E1122 Type 2 diabetes mellitus with diabetic chronic kidney disease: Secondary | ICD-10-CM | POA: Diagnosis not present

## 2017-12-28 DIAGNOSIS — N183 Chronic kidney disease, stage 3 (moderate): Secondary | ICD-10-CM | POA: Diagnosis not present

## 2017-12-28 DIAGNOSIS — E1143 Type 2 diabetes mellitus with diabetic autonomic (poly)neuropathy: Secondary | ICD-10-CM | POA: Diagnosis not present

## 2017-12-28 DIAGNOSIS — D509 Iron deficiency anemia, unspecified: Secondary | ICD-10-CM | POA: Diagnosis not present

## 2017-12-28 DIAGNOSIS — K449 Diaphragmatic hernia without obstruction or gangrene: Secondary | ICD-10-CM

## 2017-12-28 DIAGNOSIS — G4733 Obstructive sleep apnea (adult) (pediatric): Secondary | ICD-10-CM | POA: Diagnosis not present

## 2017-12-28 DIAGNOSIS — I5032 Chronic diastolic (congestive) heart failure: Secondary | ICD-10-CM | POA: Diagnosis not present

## 2017-12-28 DIAGNOSIS — N179 Acute kidney failure, unspecified: Secondary | ICD-10-CM | POA: Diagnosis not present

## 2017-12-28 DIAGNOSIS — B9681 Helicobacter pylori [H. pylori] as the cause of diseases classified elsewhere: Secondary | ICD-10-CM | POA: Diagnosis not present

## 2017-12-28 DIAGNOSIS — K3189 Other diseases of stomach and duodenum: Secondary | ICD-10-CM

## 2017-12-28 DIAGNOSIS — E785 Hyperlipidemia, unspecified: Secondary | ICD-10-CM | POA: Diagnosis not present

## 2017-12-28 DIAGNOSIS — E039 Hypothyroidism, unspecified: Secondary | ICD-10-CM | POA: Diagnosis not present

## 2017-12-28 HISTORY — PX: BIOPSY: SHX5522

## 2017-12-28 HISTORY — PX: ESOPHAGOGASTRODUODENOSCOPY (EGD) WITH PROPOFOL: SHX5813

## 2017-12-28 LAB — CBC
HEMATOCRIT: 35.4 % — AB (ref 36.0–46.0)
HEMOGLOBIN: 11 g/dL — AB (ref 12.0–15.0)
MCH: 23.5 pg — ABNORMAL LOW (ref 26.0–34.0)
MCHC: 31.1 g/dL (ref 30.0–36.0)
MCV: 75.6 fL — ABNORMAL LOW (ref 78.0–100.0)
Platelets: 271 10*3/uL (ref 150–400)
RBC: 4.68 MIL/uL (ref 3.87–5.11)
RDW: 22.2 % — ABNORMAL HIGH (ref 11.5–15.5)
WBC: 3.6 10*3/uL — AB (ref 4.0–10.5)

## 2017-12-28 LAB — BASIC METABOLIC PANEL
ANION GAP: 9 (ref 5–15)
BUN: 22 mg/dL (ref 8–23)
CO2: 25 mmol/L (ref 22–32)
Calcium: 8.8 mg/dL — ABNORMAL LOW (ref 8.9–10.3)
Chloride: 104 mmol/L (ref 98–111)
Creatinine, Ser: 1.99 mg/dL — ABNORMAL HIGH (ref 0.44–1.00)
GFR, EST AFRICAN AMERICAN: 29 mL/min — AB (ref >60)
GFR, EST NON AFRICAN AMERICAN: 25 mL/min — AB (ref >60)
GLUCOSE: 96 mg/dL (ref 70–99)
POTASSIUM: 4 mmol/L (ref 3.5–5.1)
SODIUM: 138 mmol/L (ref 135–145)

## 2017-12-28 LAB — GLUCOSE, CAPILLARY
GLUCOSE-CAPILLARY: 96 mg/dL (ref 70–99)
Glucose-Capillary: 133 mg/dL — ABNORMAL HIGH (ref 70–99)

## 2017-12-28 LAB — CORTISOL: Cortisol, Plasma: 15.5 ug/dL

## 2017-12-28 SURGERY — ESOPHAGOGASTRODUODENOSCOPY (EGD) WITH PROPOFOL
Anesthesia: Monitor Anesthesia Care

## 2017-12-28 MED ORDER — SODIUM CHLORIDE 0.9 % IV SOLN
INTRAVENOUS | Status: DC
  Administered 2017-12-28: 13:00:00 via INTRAVENOUS

## 2017-12-28 MED ORDER — PHENYLEPHRINE 40 MCG/ML (10ML) SYRINGE FOR IV PUSH (FOR BLOOD PRESSURE SUPPORT)
PREFILLED_SYRINGE | INTRAVENOUS | Status: DC | PRN
  Administered 2017-12-28: 80 ug via INTRAVENOUS

## 2017-12-28 MED ORDER — LIDOCAINE 2% (20 MG/ML) 5 ML SYRINGE
INTRAMUSCULAR | Status: DC | PRN
  Administered 2017-12-28: 60 mg via INTRAVENOUS

## 2017-12-28 MED ORDER — ONDANSETRON HCL 4 MG/2ML IJ SOLN
INTRAMUSCULAR | Status: DC | PRN
  Administered 2017-12-28: 4 mg via INTRAVENOUS

## 2017-12-28 MED ORDER — DEXTROSE-NACL 5-0.45 % IV SOLN
INTRAVENOUS | Status: DC
  Administered 2017-12-28: 11:00:00 via INTRAVENOUS

## 2017-12-28 MED ORDER — ALBUTEROL SULFATE HFA 108 (90 BASE) MCG/ACT IN AERS
INHALATION_SPRAY | RESPIRATORY_TRACT | Status: DC | PRN
  Administered 2017-12-28: 2 via RESPIRATORY_TRACT

## 2017-12-28 MED ORDER — PROPOFOL 10 MG/ML IV BOLUS
INTRAVENOUS | Status: DC | PRN
  Administered 2017-12-28: 50 mg via INTRAVENOUS
  Administered 2017-12-28 (×2): 25 mg via INTRAVENOUS

## 2017-12-28 SURGICAL SUPPLY — 15 items

## 2017-12-28 NOTE — Consult Note (Signed)
[facility] Consult: 10:46 AM 12/28/2017  LOS: 1 day    Referring Provider: Dr Tana Coast  Primary Care Physician:  Colon Branch, MD Primary Gastroenterologist:  Dr. Thomasenia Sales    Reason for Consultation:  Vomiting, nausea.     HPI: [name] is a 67 y.o. female.  Hx NIDDM.  CKD.  OSA.  Obesity. 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   Hypothyroidism.  Pericarditis 10/2017.  Left heart cath 10/2017: No CAD.  Small pericardial effusion CHF.  Echo 12/22/17: LVEF 55 to 60%. Not able to eval for diastolic dysfx or pulmonary pressure.  No signif valve disease.      Borderline hepatomegaly, mild fatty liver on ultrasound in 2009 Screening colonoscopies in  2005, 2008.  Diverticulosis, hemorrhoids, no polyps.    2013 Colonoscopy for rectal bleeding.  Banded int hemorrhoids, removed 3 mm sessile sigmoid polyp (path: HP)    Starting around April 2019, patient developed postprandial nausea, vomiting.  She does have solid food dysphagia but has never had to regurgitate food she could not swallow.  The nausea starts within 10 or 15 minutes of eating.  Emesis is never dark, coffee-ground, bloody.  She also has a meal associated pain in a bandlike pattern bilaterally at the level of the mid abdomen somewhat in the upper abdomen as well.  Sometimes the discomfort radiates to beneath her left breast. Stools soft, liquid, brown up to 4 x daily for last 2 weeks, before that formed brown, a few times a week.   136 kg on 08/07/17.  103 kg now.   Started Protonix 40 mg q day in June 2019.  On 81 mg ASA.   Esophagram 11/13/17: Nonspecific esophageal motility disorder. No visible fixed stricture. Brain MRI 11/19/17: nothing acute.  Chronic microvascular ischemia and volume loss.    Noncontrast CT ab/pelvis 8/18: colon  diverticulosis, no diverticulitis or acute abd/pelvic findings.  Non-specific, bil,  interstitial PNA and bonchiectasis.    t bili 1.4.  Alk phos 64.  AST/ALT 95/62.  Lipase 66.   + AKI.   Hgb 11.  MCV 75.    SQ Heparin at 0700 today.  Next dose due at 1400.   Consumed clears for AM meal today.       Past Medical History:  Diagnosis Date  . Anterolisthesis    Grade 1, L4-5  . Bronchospasm 05/28/2012  . CHEST PAIN 11/18/2007  . DEGENERATIVE JOINT DISEASE 10/06/2006  . DEPRESSION 09/26/2008  . DIABETES MELLITUS 10/06/2006  . Diverticulosis    [id number]  . GERD (gastroesophageal reflux disease) 07/25/2013  . HYPERLIPIDEMIA 01/11/2009  . HYPERTENSION 10/06/2006  . INSOMNIA 09/26/2008  . Internal hemorrhoids   . OBSTRUCTIVE SLEEP APNEA 06/23/2008   Severe OSA per sleep study 2010, Rx a CPAP  . Pain in joint, multiple sites 11/10/2006  . UNSPECIFIED ANEMIA 12/10/2009  . UTI (urinary tract infection) 11/2017    Past Surgical History:  Procedure Laterality Date  . COLONOSCOPY  08/01/2011   Procedure: COLONOSCOPY;  Surgeon: Inda Castle,  MD;  Location: [facility];  Service: Endoscopy;  Laterality: N/A;  . LEFT HEART CATH AND CORONARY ANGIOGRAPHY N/A 10/22/2017   Procedure: LEFT HEART CATH AND CORONARY ANGIOGRAPHY;  Surgeon: Jettie Booze, MD;  Location: [facility];  Service: Cardiovascular;  Laterality: N/A;  . 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 Right knee replacement  2005    Prior to Admission medications   Medication Sig Start Date End Date Taking? Authorizing Provider  acetaminophen (TYLENOL) 325 MG tablet Take 2 tablets (650 mg total) by mouth every 6 (six) hours as needed for mild pain (or Fever >/= 101). Patient taking differently: Take 325 mg by mouth every 6 (six) hours as needed for mild pain (or Fever >/= 101).  11/20/17  Yes Emokpae, Courage, MD  albuterol (PROVENTIL HFA;VENTOLIN HFA) 108 (90 Base) MCG/ACT inhaler Inhale 2 puffs into the lungs every 4 (four) hours  as needed for wheezing. 11/20/17 01/18/29 Yes Emokpae, Courage, MD  aspirin EC 81 MG tablet Take 1 tablet (81 mg total) by mouth daily. With food 11/20/17  Yes Emokpae, Courage, MD  budesonide-formoterol (SYMBICORT) 160-4.5 MCG/ACT inhaler Inhale 2 puffs into the lungs 2 (two) times daily. 11/20/17  Yes Emokpae, Courage, MD  colchicine 0.6 MG tablet Take 1 tablet (0.6 mg total) by mouth 2 (two) times daily. 11/20/17  Yes Emokpae, Courage, MD  latanoprost (XALATAN) 0.005 % ophthalmic solution USE 1 DROP IN BOTH EYES AT BEDTIME Patient taking differently: Place 1 drop into both eyes at bedtime. USE 1 DROP IN BOTH EYES AT BEDTIME 11/20/17  Yes Emokpae, Courage, MD  levothyroxine (SYNTHROID, LEVOTHROID) 75 MCG tablet Take 1 tablet (75 mcg total) by mouth daily before breakfast. 11/20/17  Yes Emokpae, Courage, MD  metoprolol tartrate (LOPRESSOR) 25 MG tablet Take 1.5 tablets (37.5 mg total) by mouth 2 (two) times daily. 12/18/17  Yes Almyra Deforest, PA  OVER THE COUNTER MEDICATION CPAP   Yes [provider]  OXYGEN Inhale 2 L into the lungs continuous.    Yes [provider]  pantoprazole (PROTONIX) 40 MG tablet Take 1 tablet (40 mg total) by mouth daily before breakfast. 11/02/17  Yes Paz, Alda Berthold, MD  potassium chloride (K-DUR,KLOR-CON) 10 MEQ tablet Take 1 tablet (10 mEq total) by mouth daily. 11/20/17  Yes Emokpae, Courage, MD  rosuvastatin (CRESTOR) 40 MG tablet Take 1 tablet (40 mg total) by mouth daily. Patient taking differently: Take 40 mg by mouth every evening.  11/20/17  Yes Emokpae, Courage, MD  sitaGLIPtin (JANUVIA) 50 MG tablet Take 1 tablet (50 mg total) by mouth daily. 12/04/17  Yes Renato Shin, MD  ACCU-CHEK AVIVA PLUS test strip TEST BLOD SUGAR TWICE DAILY AND LANCETS TWICE DAILY 10/15/17   Renato Shin, MD  ACCU-CHEK SOFTCLIX LANCETS lancets USE TO CHECK BLOOD SUGAR TWICE A DAY 10/05/17   Renato Shin, MD  escitalopram (LEXAPRO) 10 MG tablet Take 1 tablet (10 mg total) by mouth daily.  12/23/17   Geradine Girt, DO  furosemide (LASIX) 20 MG tablet Take 1 tablet (20 mg total) by mouth daily. 12/23/17 12/23/18  Geradine Girt, DO  Melatonin 3 MG TABS Take 1-2 tablets (3-6 mg total) by mouth at bedtime as needed (insomnia). 12/23/17   Geradine Girt, DO    Scheduled Meds: . aspirin EC  81 mg Oral Daily  . fluticasone furoate-vilanterol  1 puff Inhalation Daily  . heparin  5,000 Units Subcutaneous Q8H  . latanoprost  1 drop Both Eyes QHS  .  levothyroxine  75 mcg Oral QAC breakfast  . metoprolol tartrate  37.5 mg Oral BID  . pantoprazole  40 mg Oral QAC breakfast  . sodium chloride flush  3 mL Intravenous Q12H   Infusions: . dextrose 5 % and 0.45% NaCl 50 mL/hr at 12/28/17 1036   PRN Meds: albuterol, ondansetron   Allergies as of 12/27/2017  . (No Known Allergies)    Family History  Problem Relation Age of Onset  . Asthma Mother   . Stroke Mother   . Diabetes Other        M, B, S  . Hypertension Sister        M, S,B  . Pancreatic cancer Brother   . Colon cancer Neg Hx   . Prostate cancer Neg Hx   . Breast cancer Neg Hx     Social History   Socioeconomic History  . Marital status: Widowed    Spouse name: Not on file  . Number of children: 4  . Years of education: Not on file  . Highest education level: Not on file  Occupational History  . Occupation: disability    Employer: UNEMPLOYED  Social Needs  . Financial resource strain: Not on file  . Food insecurity:    Worry: Not on file    Inability: Not on file  . Transportation needs:    Medical: Not on file    Non-medical: Not on file  Tobacco Use  . Smoking status: Former Smoker    Packs/day: 0.20    Years: 4.00    Pack years: 0.80    Types: Cigarettes    Last attempt to quit: 05/13/1995    Years since quitting: 22.6  . Smokeless tobacco: Never Used  Substance and Sexual Activity  . Alcohol use: Not Currently  . Drug use: No  . Sexual activity: Not Currently  Lifestyle  . Physical  activity:    Days per week: Not on file    Minutes per session: Not on file  . Stress: Not on file  Relationships  . Social connections:    Talks on phone: Not on file    Gets together: Not on file    Attends religious service: Not on file    Active member of club or organization: Not on file    Attends meetings of clubs or organizations: Not on file    Relationship status: Not on file  . Intimate partner violence:    Fear of current or ex partner: Not on file    Emotionally abused: Not on file    Physically abused: Not on file    Forced sexual activity: Not on file  Other Topics Concern  . Not on file  Social History Narrative   Widow , lives by herself   Lost a son, 3 living    Lost husband    REVIEW OF SYSTEMS: Constitutional: No new profound weakness or fatigue. ENT:  No nose bleeds Pulm: Stable DOE.  Pretty inactive so does not challenge her respiratory status much.  Uses CPAP at night. CV:  No palpitations, no LE edema.  No substernal pain. GU:  No hematuria, no frequency GI:  Per HPI Heme: No unusual bleeding or bruising. Transfusions: Not recall previous blood transfusions. Neuro:  No headaches, no peripheral tingling or numbness Derm:  No itching, no rash or sores.  Endocrine:  No sweats or chills.  No polyuria or dysuria Immunization:  reviewed Travel:  None beyond local counties in last few months.  PHYSICAL EXAM: Vital signs in last 24 hours: Vitals:   12/28/17 0812 12/28/17 0848  BP: 128/82   Pulse: 83   Resp: 18   Temp: 97.6 F (36.4 C)   SpO2:  98%   Wt Readings from Last 3 Encounters:  12/28/17 102.9 kg  12/23/17 102.9 kg  12/18/17 110.7 kg    General: Obese, alert, somewhat chronically ill appearing older AAF. Head: Symmetry or swelling. Eyes: Scleral icterus.  No conjunctival pallor. Ears: Not hard of hearing. Nose: Congestion or discharge Mouth: Tongue midline.  Oral mucosa pink, moist, clear. Neck: No JVD, no masses, no  thyromegaly. Lungs: Fine crackles in the bases bilaterally.  No labored breathing or cough. Heart: RRR.  No MRG.  S1, S2 present. Abdomen: Soft.  Mild tenderness bilaterally in the mid and less so in the upper quadrants.  No guarding, no rebound.  Bowel sounds active.  No organomegaly, bruits, hernias..   Rectal: Stool liquid, brown, FOBT negative. Musc/Skeltl: No joint swelling, redness or significant deformity. Extremities: CCE. Neurologic: Alert.  Oriented x3.  Moves all 4 limbs.  No tremors.  Did not test limb strength.  Grossly neurologically intact. Skin: No rashes, sores or suspicious lesions. Tattoos: None observed Nodes: Cervical adenopathy. Psych: Operative, pleasant, calm.  speech fluid  Intake/Output from previous day: 08/18 0701 - 08/19 0700 In: 1193.6 [P.O.:240; I.V.:953.6] Out: 0  Intake/Output this shift: Total I/O In: 3 [I.V.:3] Out: -   LAB RESULTS: Recent Labs    12/27/17 1046 12/28/17 0406  WBC 4.4 3.6*  HGB 11.5* 11.0*  HCT 37.8 35.4*  PLT 259 271   BMET Lab Results  Component Value Date   NA 138 12/28/2017   NA 137 12/27/2017   NA 139 12/21/2017   K 4.0 12/28/2017   K 4.3 12/27/2017   K 3.3 (L) 12/21/2017   CL 104 12/28/2017   CL 101 12/27/2017   CL 105 12/21/2017   CO2 25 12/28/2017   CO2 21 (L) 12/27/2017   CO2 24 12/21/2017   GLUCOSE 96 12/28/2017   GLUCOSE 59 (L) 12/27/2017   GLUCOSE 84 12/21/2017   BUN 22 12/28/2017   BUN 24 (H) 12/27/2017   BUN 7 (L) 12/21/2017   CREATININE 1.99 (H) 12/28/2017   CREATININE 2.17 (H) 12/27/2017   CREATININE 1.45 (H) 12/21/2017   CALCIUM 8.8 (L) 12/28/2017   CALCIUM 9.0 12/27/2017   CALCIUM 8.7 (L) 12/21/2017   LFT Recent Labs    12/27/17 1046  PROT 8.3*  ALBUMIN 3.1*  AST 95*  ALT 62*  ALKPHOS 64  BILITOT 1.4*  BILIDIR 0.3*  IBILI 1.1*   PT/INR Lab Results  Component Value Date   INR 1.12 10/22/2017   INR 1.05 08/01/2017   INR 1.11 07/29/2017   Hepatitis Panel No results for  input(s): HEPBSAG, HCVAB, HEPAIGM, HEPBIGM in the last 72 hours. C-Diff No components found for: CDIFF Lipase     Component Value Date/Time   LIPASE 66 (H) 12/27/2017 1046    Drugs of Abuse     Component Value Date/Time   LABOPIA NONE DETECTED 12/23/2017 1310   COCAINSCRNUR NONE DETECTED 12/23/2017 1310   LABBENZ NONE DETECTED 12/23/2017 1310   AMPHETMU NONE DETECTED 12/23/2017 1310   THCU NONE DETECTED 12/23/2017 1310   LABBARB NONE DETECTED 12/23/2017 1310     RADIOLOGY STUDIES: Ct Abdomen Pelvis Wo Contrast  Result Date: 12/27/2017 CLINICAL DATA:  Diffuse abdominal pain for a few months, nausea and vomiting. History of hemorrhoids, urinary tract  infection. EXAM: CT ABDOMEN AND PELVIS WITHOUT CONTRAST TECHNIQUE: Multidetector CT imaging of the abdomen and pelvis was performed following the standard protocol without IV contrast. COMPARISON:  CT chest December 22, 2017. Renal ultrasound November 18, 2017. CT lumbar spine April 28, 2006. FINDINGS: LOWER CHEST: Bilateral lower lobe bronchiectasis with confluent peripheral ground-glass opacities, subpleural sparing. Included heart size is normal. No pericardial effusion. Gas distended esophagus associated with reflux. HEPATOBILIARY: Layering density within gallbladder, likely vicarious excretion of contrast. No CT findings of acute cholecystitis. Normal gallbladder. PANCREAS: Normal. SPLEEN: Normal. ADRENALS/URINARY TRACT: Kidneys are orthotopic, demonstrating normal size and morphology. Bilateral cortical scarring. 7 mm crescentic calcification along LEFT vascular pedicle could reflect aneurysm. 12 mm cyst lower pole RIGHT kidney. No nephrolithiasis, hydronephrosis; limited assessment for renal masses by nonenhanced CT. The unopacified ureters are normal in course and caliber. Urinary bladder is decompressed and unremarkable. Normal adrenal glands. STOMACH/BOWEL: Very small hiatal hernia. The stomach, small and large bowel are normal in course and  caliber without inflammatory change. Mild colonic diverticulosis. Normal appendix. VASCULAR/LYMPHATIC: Aortoiliac vessels are normal in course and caliber. Mild calcific atherosclerosis. No lymphadenopathy by CT size criteria. REPRODUCTIVE: Normal. OTHER: No intraperitoneal free fluid or free air. MUSCULOSKELETAL: Non-acute. Severe pubic symphyseal osteoarthrosis. Severe lower lumbar facet arthropathy. Moderate hip osteoarthrosis. IMPRESSION: 1. Colonic diverticulosis without acute diverticulitis nor acute intra-abdominal/pelvic process. 2. Bibasilar ground-glass opacities with subpleural sparing associated with nonspecific interstitial pneumonia. Bilateral lower lobe bronchiectasis. Aortic Atherosclerosis (ICD10-I70.0). Electronically Signed   By: Elon Alas M.D.   On: 12/27/2017 18:00   Dg Chest 2 View  Result Date: 12/27/2017 CLINICAL DATA:  Generalized chest pain. EXAM: CHEST - 2 VIEW COMPARISON:  Chest CT dated December 22, 2017. Chest x-ray dated December 21, 2017. FINDINGS: The heart size and mediastinal contours are within normal limits. Normal pulmonary vascularity. Atherosclerotic calcification of the aortic arch. Patchy opacities at the lung bases are similar to prior studies. No new focal infiltrate. No pleural effusion or pneumothorax. No acute osseous abnormality. IMPRESSION: 1. Stable bibasilar patchy airspace disease, better evaluated on recent chest CT. Electronically Signed   By: Titus Dubin M.D.   On: 12/27/2017 11:18      IMPRESSION:   *    Post prandial nausea, nonbloody emesis for several months.  Also has bilateral mid abdominal pain and chronic mild LFT elevation.  Lipase slightly elevated. Mild esophageal dysmotility on esophagram 6 weeks ago.   Rule out peptic ulcer disease that has not responded to a couple of months worth of daily Protonix ? Gastroparesis.   Rule out biliary etiology though noncontrast CT unremarkable in terms of biliary pathology.  Has had finding  of fatty liver and hepatomegaly on ultrasound in 2009  *    Microcytosis with mild anemia.   Hgb stable for last 5 months.  Low iron, ferritin 12/07/2017.  *   IDDM  *    Mild CKD  *    OSA, on nocturnal CPAP and day time O2  *   Small pericardial effusion 10/2017.      PLAN:     *   EGD with possible esoph dilatation today.    *  Anemia panel in AM.     Azucena Freed  12/28/2017, 10:46 AM Phone [phone]

## 2017-12-28 NOTE — Telephone Encounter (Signed)
LMOM w/ verbal orders.  

## 2017-12-28 NOTE — Op Note (Signed)
[facility] Patient Name: [name] Procedure Date : 12/28/2017 MRN: [id number] Attending MD: Justice Britain , MD Date of Birth: 1951/01/08 CSN: [id number] Age: 67 Admit Type: Inpatient Procedure:                Upper GI endoscopy Indications:              Generalized abdominal pain, Epigastric abdominal                            distress (post-prandial), Esophageal dysphagia,                            Heartburn, Gastro-esophageal reflux disease,                            Abnormal cine-esophagram Providers:                Justice Britain, MD, Kingsley Plan, RN,                            Alan Mulder, Technician Referring MD:             Kathlene November, MD, Ripudeep Fransisca Kaufmann PA, PA Medicines:                Monitored Anesthesia Care Complications:            No immediate complications. Estimated Blood Loss:     Estimated blood loss was minimal. Procedure:                Pre-Anesthesia Assessment:                           - Prior to the procedure, a History and Physical                            was performed, and patient medications and                            allergies were reviewed. The patient's tolerance of                            previous anesthesia was also reviewed. The risks                            and benefits of the procedure and the sedation                            options and risks were discussed with the patient.                            All questions were answered, and informed consent                            was obtained. Prior Anticoagulants: The patient has  taken no previous anticoagulant or antiplatelet                            agents. ASA Grade Assessment: III - A patient with                            severe systemic disease. After reviewing the risks                            and benefits, the patient was deemed in                            satisfactory condition to undergo  the procedure.                           After obtaining informed consent, the endoscope was                            passed under direct vision. Throughout the                            procedure, the patient's blood pressure, pulse, and                            oxygen saturations were monitored continuously. The                            [id number]-[id number] ([id number]) Olympus Adult EGD was introduced                            through the mouth, and advanced to the second part                            of duodenum. The upper GI endoscopy was                            accomplished without difficulty. The patient                            tolerated the procedure. Scope In: Scope Out: Findings:      The mid esophagus and distal esophagus were moderately tortuous.      No other gross lesions were noted in the entire esophagus. Biopsies were       taken with a cold forceps for histology to rule out Eosinophilic       esophagitis.      A medium-sized hiatal hernia was found. The proximal extent of the       gastric folds (end of tubular esophagus) was 41 cm from the incisors.       The hiatal narrowing was 44 cm from the incisors. The Z-line was 40 cm       from the incisors.      Patchy mildly erythematous mucosa without bleeding was found on the       greater curvature of the gastric antrum.      No gross  lesions were noted in the entire examined stomach. Biopsies       were taken with a cold forceps for histology and Helicobacter pylori       testing from the antrum/incisura/greater curve/lesser curve.      No gross lesions were noted in the duodenal bulb, in the first portion       of the duodenum and in the second portion of the duodenum. Impression:               - Tortuous esophagus. No other gross lesions in                            esophagus. Biopsied to rule out EoE.                           - Medium-sized hiatal hernia.                           - Erythematous mucosa in the  greater curvature of                            the gastric antrum. No other gross lesions in the                            stomach. Biopsied to rule out gastritis & HP.                           - No gross lesions in the duodenal bulb, in the                            first portion of the duodenum and in the second                            portion of the duodenum. Recommendation:           - The patient will be observed post-procedure,                            until all discharge criteria are met.                           - Return patient to hospital ward for ongoing care.                           - Advance diet as tolerated.                           - Observe patient's clinical course.                           - Continue at least once daily PPI.                           - Await pathology results.                           -  Additional workup may be performed as an                            outpatient if the patient desires follow up or                            while she remains an active inpatient status.                           - The findings and recommendations were discussed                            with the patient.                           - The findings and recommendations were discussed                            with the patient's family.                           - The findings and recommendations were discussed                            with the referring physician. Procedure Code(s):        --- Professional ---                           [phone], Esophagogastroduodenoscopy, flexible,                            transoral; with biopsy, single or multiple Diagnosis Code(s):        --- Professional ---                           Q39.9, Congenital malformation of esophagus,                            unspecified                           K44.9, Diaphragmatic hernia without obstruction or                            gangrene                           K31.89, Other  diseases of stomach and duodenum                           R10.84, Generalized abdominal pain                           R10.13, Epigastric pain                           R12, Heartburn  K21.9, Gastro-esophageal reflux disease without                            esophagitis                           R13.14, Dysphagia, pharyngoesophageal phase                           R93.3, Abnormal findings on diagnostic imaging of                            other parts of digestive tract CPT copyright 2017 [employer]. All rights reserved. The codes documented in this report are preliminary and upon coder review may  be revised to meet current compliance requirements. Justice Britain, MD 12/28/2017 3:02:56 PM Number of Addenda: 0

## 2017-12-28 NOTE — Anesthesia Preprocedure Evaluation (Addendum)
Anesthesia Evaluation  Patient identified by MRN, date of birth, ID band Patient awake    Reviewed: Allergy & Precautions, NPO status , Patient's Chart, lab work & pertinent test results  History of Anesthesia Complications Negative for: history of anesthetic complications  Airway Mallampati: II  TM Distance: >3 FB Neck ROM: Full    Dental  (+) Dental Advisory Given, Teeth Intact   Pulmonary asthma , sleep apnea, Continuous Positive Airway Pressure Ventilation and Oxygen sleep apnea , former smoker,    breath sounds clear to auscultation       Cardiovascular hypertension, Pt. on home beta blockers and Pt. on medications +CHF   Rhythm:Regular Rate:Normal   '19 TTE - EF 55% to 60%. RV systolic function was mildly reduced. A trivial pericardial effusion was identified posterior to the heart.  '19 Cath - The left ventricular systolic function is normal. LV end diastolic pressure is low. The left ventricular EF is 55-65% by visual estimate. There is no aortic valve stenosis. Small pericardial effusion.  '18 Nuc Stress - Nuclear stress EF: 44%. There was no ST segment deviation noted during stress. This is an intermediate risk study. The LVEF is moderately decreased (30-44%).  Normal perfusion no ischemia or infarction  LVE with diffuse hypokinesis EF 44%     Neuro/Psych Anxiety Depression negative neurological ROS     GI/Hepatic Neg liver ROS, GERD  Medicated,  Endo/Other  diabetes, Insulin DependentHypothyroidism  Obesity   Renal/GU Renal disease  negative genitourinary   Musculoskeletal  (+) Arthritis ,   Abdominal (+) + obese,   Peds  Hematology negative hematology ROS (+)   Anesthesia Other Findings Patient c/o chest pain, felt by medical team to be related to esophageal dysmotility. Recent cardiac workup without any evidence of cardiac cause.  Reproductive/Obstetrics                         Anesthesia Physical Anesthesia Plan  ASA: III  Anesthesia Plan: MAC   Post-op Pain Management:    Induction: Intravenous  PONV Risk Score and Plan: 2 and Propofol infusion and Treatment may vary due to age or medical condition  Airway Management Planned: Nasal Cannula and Natural Airway  Additional Equipment: None  Intra-op Plan:   Post-operative Plan:   Informed Consent: I have reviewed the patients History and Physical, chart, labs and discussed the procedure including the risks, benefits and alternatives for the proposed anesthesia with the patient or authorized representative who has indicated his/her understanding and acceptance.   Dental advisory given  Plan Discussed with: CRNA and Anesthesiologist  Anesthesia Plan Comments:        Anesthesia Quick Evaluation

## 2017-12-28 NOTE — Progress Notes (Signed)
Patient is refusing the use of CPAP for tonight. RT informed patient if she changes her mind have RN contact RT. 

## 2017-12-28 NOTE — Progress Notes (Signed)
Inpatient Diabetes Program Recommendations  AACE/ADA: New Consensus Statement on Inpatient Glycemic Control (2019)  Target Ranges:  Prepandial:   less than 140 mg/dL      Peak postprandial:   less than 180 mg/dL (1-2 hours)      Critically ill patients:  140 - 180 mg/dL   Results for BLAKELEIGH, DOMEK (MRN [id number]) as of 12/28/2017 12:30  Ref. Range 12/27/2017 13:18 12/27/2017 14:34 12/27/2017 16:17 12/27/2017 20:15 12/27/2017 20:59 12/27/2017 21:24 12/28/2017 05:08 12/28/2017 12:14  Glucose-Capillary Latest Ref Range: 70 - 99 mg/dL 37 (LL) 72 62 (L) 50 (L) 37 (LL) 157 (H) 96 133 (H)  Results for KILAH, DRAHOS (MRN [id number]) as of 12/28/2017 12:30  Ref. Range 12/27/2017 10:46  Glucose Latest Ref Range: 70 - 99 mg/dL 59 (L)  Results for JACILYN, SANPEDRO (MRN [id number]) as of 12/28/2017 12:30  Ref. Range 08/04/2017 14:17 10/20/2017 08:47  Hemoglobin A1C Latest Ref Range: 4.8 - 5.6 % 6.8 5.6   Review of Glycemic Control  Diabetes history: DM2 Outpatient Diabetes medications: Januvia 50 mg daily Current orders for Inpatient glycemic control: CBGs Q6H  Inpatient Diabetes Program Recommendations: HgbA1C: A1C 5.6% on 10/20/2017, initial glucose 59 mg/dl, recurrent hypoglycemia on 12/27/17 despite dextrose in IV fluids, and per H&P patient has lost 50 pounds over past 3-4 months. MD may want to consider discontinuing Januvia as an outpatient and have patient follow up with PCP regarding DM control.  Thanks, Barnie Alderman, RN, MSN, CDE Diabetes Coordinator Inpatient Diabetes Program [phone] (Team Pager from 8am to 5pm)

## 2017-12-28 NOTE — Telephone Encounter (Signed)
Received Resumption of Care with Home Health Certification and Plan of Care from [facility]; forwarded to provider/SLS 08/19

## 2017-12-28 NOTE — Telephone Encounter (Signed)
Received Physician Orders from [facility] for Add-on Discipline; forwarded to provider/SLS 08/19

## 2017-12-28 NOTE — Progress Notes (Signed)
[facility]                                                                              Patient Demographics  [name], is a 67 y.o. female, DOB - 1950-11-30, HKV:[id number]  Admit date - 12/27/2017   Admitting Physician Vilma Prader, MD  Outpatient Primary MD for the patient is Colon Branch, MD  Outpatient specialists:   LOS - 1  days   Medical records reviewed and are as summarized below:    Chief Complaint  Patient presents with  . Abdominal Pain  . Chest Pain       Brief summary   Patient is a 67 year old female with obesity, chronic hypoxic respiratory failure with 3 L nasal cannula O2 at baseline, asthma, OSA on CPAP at bedtime, diastolic CHF, history of pericarditis in June/19, hypothyroidism, non-insulin-dependent diabetes, hypertension, CKD, baseline creatinine 1.5 presented with chronic nausea and vomiting, abdominal pain.  Patient reports symptoms for last 3 to 4 months, prior to admission in July, barium swallow was performed which was suggestive of esophageal motility disorder.  Patient also reports about 50 pound weight loss in the last few months due to not able to eat, states that she has not been able to tolerate food, drink other than water, most of the time she has nausea and vomiting after trying to eat.  Patient was admitted for further work-up.   Assessment & Plan    Principal Problem: Acute on chronic postprandial nausea, with vomiting, weight loss -Prior barium swallow suggestive of esophageal motility disorder -GI consulted, recommendation for EGD -Continue IV fluids with D5 half-normal saline until able to tolerate diet -Continue PPI -Random cortisol level normal.  CT abdomen pelvis with no intra-abdominal pathology  Active Problems:   DM II (diabetes mellitus, type II), controlled (HCC) -CBG 59 this morning -Patient had hypoglycemia due to n.p.o. status, placed on IV fluid hydration with D5 half-normal saline -Hold  oral hypoglycemics -Follow hemoglobin A1c    Essential hypertension, chronic diastolic CHF -BNP> 564 however does not appear to be volume overloaded, hold diuretics for now    GERD (gastroesophageal reflux disease) -Continue PPI  History of pericarditis -Most recent TTE without significant effusion, outpatient follow-up with cardiology    Morbid obesity ([place]) -BMI 33.4, follow diet and exercise    AKI (acute kidney injury) ([employer]) on CKD stage III -Baseline creatinine 1.4-1.5, admitted with creatinine of 2.17 secondary to dehydration, poor oral intake -Continue IV fluid hydration, creatinine improving 1.9 today  Chronic respiratory failure with hypoxia, O2 dependent, OSA -Continue CPAP, O2 supplementation  Code Status: Full CODE STATUS DVT Prophylaxis: Heparin subcu Family Communication: Discussed in detail with the patient, all imaging results, lab results explained to the patient    Disposition Plan: Once tolerating diet  Time Spent in minutes   25  Procedures:  None  Consultants:   Gastroenterology  Antimicrobials:      Medications  Scheduled Meds: . aspirin EC  81 mg Oral Daily  . fluticasone furoate-vilanterol  1 puff Inhalation Daily  . heparin  5,000 Units Subcutaneous Q8H  . latanoprost  1 drop Both Eyes  QHS  . levothyroxine  75 mcg Oral QAC breakfast  . metoprolol tartrate  37.5 mg Oral BID  . pantoprazole  40 mg Oral QAC breakfast  . sodium chloride flush  3 mL Intravenous Q12H   Continuous Infusions: . dextrose 5 % and 0.45% NaCl 50 mL/hr at 12/28/17 1036   PRN Meds:.albuterol, ondansetron   Antibiotics   Anti-infectives (From admission, onward)   None        Subjective:   [name] was seen and examined today.  Complains of acute on chronic nausea and vomiting, unable to eat for last 3 to 4 months. Patient denies dizziness, chest pain, shortness of breath.  No acute events overnight.    Objective:   Vitals:   12/28/17 0509  12/28/17 0737 12/28/17 0812 12/28/17 0848  BP: 122/83 128/82 128/82   Pulse: 81 83 83   Resp: 18  18   Temp: 98.4 F (36.9 C) 97.6 F (36.4 C) 97.6 F (36.4 C)   TempSrc: Oral Oral Oral   SpO2: 100% 100%  98%  Weight:   102.9 kg   Height:   5\' 9"  (1.753 m)     Intake/Output Summary (Last 24 hours) at 12/28/2017 1208 Last data filed at 12/28/2017 1036 Gross per 24 hour  Intake 1196.61 ml  Output 200 ml  Net 996.61 ml     Wt Readings from Last 3 Encounters:  12/28/17 102.9 kg  12/23/17 102.9 kg  12/18/17 110.7 kg     Exam  General: Alert and oriented x 3, NAD  Eyes:   HEENT:  Atraumatic, normocephalic, normal oropharynx  Cardiovascular: S1 S2 auscultated, Regular rate and rhythm.  Respiratory: Clear to auscultation bilaterally, no wheezing, rales or rhonchi  Gastrointestinal: Soft, mild diffuse tenderness, nondistended, + bowel sounds  Ext: no pedal edema bilaterally  Neuro: no new deficits  Musculoskeletal: No digital cyanosis, clubbing  Skin: No rashes  Psych: Normal affect and demeanor, alert and oriented x3    Data Reviewed:  I have personally reviewed following labs and imaging studies  Micro Results Recent Results (from the past 240 hour(s))  MRSA PCR Screening     Status: None   Collection Time: 12/22/17  8:41 AM  Result Value Ref Range Status   MRSA by PCR NEGATIVE NEGATIVE Final    Comment:        The GeneXpert MRSA Assay (FDA approved for NASAL specimens only), is one component of a comprehensive MRSA colonization surveillance program. It is not intended to diagnose MRSA infection nor to guide or monitor treatment for MRSA infections. Performed at [facility], [place] [address] [place], [place] [address]     Radiology Reports Ct Abdomen Pelvis Wo Contrast  Result Date: 12/27/2017 CLINICAL DATA:  Diffuse abdominal pain for a few months, nausea and vomiting. History of hemorrhoids, urinary tract infection. EXAM: CT ABDOMEN AND  PELVIS WITHOUT CONTRAST TECHNIQUE: Multidetector CT imaging of the abdomen and pelvis was performed following the standard protocol without IV contrast. COMPARISON:  CT chest December 22, 2017. Renal ultrasound November 18, 2017. CT lumbar spine April 28, 2006. FINDINGS: LOWER CHEST: Bilateral lower lobe bronchiectasis with confluent peripheral ground-glass opacities, subpleural sparing. Included heart size is normal. No pericardial effusion. Gas distended esophagus associated with reflux. HEPATOBILIARY: Layering density within gallbladder, likely vicarious excretion of contrast. No CT findings of acute cholecystitis. Normal gallbladder. PANCREAS: Normal. SPLEEN: Normal. ADRENALS/URINARY TRACT: Kidneys are orthotopic, demonstrating normal size and morphology. Bilateral cortical scarring. 7 mm crescentic calcification  along LEFT vascular pedicle could reflect aneurysm. 12 mm cyst lower pole RIGHT kidney. No nephrolithiasis, hydronephrosis; limited assessment for renal masses by nonenhanced CT. The unopacified ureters are normal in course and caliber. Urinary bladder is decompressed and unremarkable. Normal adrenal glands. STOMACH/BOWEL: Very small hiatal hernia. The stomach, small and large bowel are normal in course and caliber without inflammatory change. Mild colonic diverticulosis. Normal appendix. VASCULAR/LYMPHATIC: Aortoiliac vessels are normal in course and caliber. Mild calcific atherosclerosis. No lymphadenopathy by CT size criteria. REPRODUCTIVE: Normal. OTHER: No intraperitoneal free fluid or free air. MUSCULOSKELETAL: Non-acute. Severe pubic symphyseal osteoarthrosis. Severe lower lumbar facet arthropathy. Moderate hip osteoarthrosis. IMPRESSION: 1. Colonic diverticulosis without acute diverticulitis nor acute intra-abdominal/pelvic process. 2. Bibasilar ground-glass opacities with subpleural sparing associated with nonspecific interstitial pneumonia. Bilateral lower lobe bronchiectasis. Aortic  Atherosclerosis (ICD10-I70.0). Electronically Signed   By: Elon Alas M.D.   On: 12/27/2017 18:00   Dg Chest 2 View  Result Date: 12/27/2017 CLINICAL DATA:  Generalized chest pain. EXAM: CHEST - 2 VIEW COMPARISON:  Chest CT dated December 22, 2017. Chest x-ray dated December 21, 2017. FINDINGS: The heart size and mediastinal contours are within normal limits. Normal pulmonary vascularity. Atherosclerotic calcification of the aortic arch. Patchy opacities at the lung bases are similar to prior studies. No new focal infiltrate. No pleural effusion or pneumothorax. No acute osseous abnormality. IMPRESSION: 1. Stable bibasilar patchy airspace disease, better evaluated on recent chest CT. Electronically Signed   By: Titus Dubin M.D.   On: 12/27/2017 11:18   Dg Chest 2 View  Result Date: 12/21/2017 CLINICAL DATA:  Chest pain and vomiting EXAM: CHEST - 2 VIEW COMPARISON:  12/07/2017 CT of the chest FINDINGS: Cardiac shadow is mildly enlarged. Aortic calcifications are again seen. Patchy changes in the bases are again seen similar to that noted on prior CT examination. No new focal infiltrate or sizable effusion is seen. No bony abnormality is noted. IMPRESSION: Stable bibasilar changes consistent with the prior CT. Electronically Signed   By: Inez Catalina M.D.   On: 12/21/2017 22:14   Ct Angio Chest Pe W And/or Wo Contrast  Result Date: 12/22/2017 CLINICAL DATA:  Sharp chest pain starting last night. Pain on palpation. Shortness of breath. EXAM: CT ANGIOGRAPHY CHEST WITH CONTRAST TECHNIQUE: Multidetector CT imaging of the chest was performed using the standard protocol during bolus administration of intravenous contrast. Multiplanar CT image reconstructions and MIPs were obtained to evaluate the vascular anatomy. CONTRAST:  162mL ISOVUE-370 IOPAMIDOL (ISOVUE-370) INJECTION 76% COMPARISON:  12/07/2017 FINDINGS: Cardiovascular: There is good opacification of the central and segmental pulmonary arteries. No  focal filling defects. No evidence of significant pulmonary embolus. Normal caliber thoracic aorta with scattered calcification. Normal heart size. No pericardial effusions. Mediastinum/Nodes: No significant lymphadenopathy in the chest. Scattered lymph nodes are not pathologically enlarged. Esophagus is not significantly dilated but there is an air-fluid level with fluid in the esophagus. This may be due to reflux or dysmotility. Distal obstructing lesion is not identified. Similar appearance to previous study. Right adrenal gland nodule again demonstrated. Lungs/Pleura: Diffuse airspace disease throughout the lungs, most prominent in the bases. Airways are patent with mild bronchiectasis. No focal consolidation. No pleural effusions. No pneumothorax. Similar appearance to previous study. Upper Abdomen: No acute changes identified. Musculoskeletal: No chest wall abnormality. No acute or significant osseous findings. Review of the MIP images confirms the above findings. IMPRESSION: 1. No evidence of significant pulmonary embolus. 2. Diffuse airspace disease throughout the lungs, most prominent in the  bases. This could represent pneumonia or edema. Similar appearance to previous study. 3. Air-fluid level in the esophagus may be due to reflux or dysmotility. Aortic Atherosclerosis (ICD10-I70.0). Electronically Signed   By: Lucienne Capers M.D.   On: 12/22/2017 04:21   Ct Chest High Resolution  Result Date: 12/07/2017 CLINICAL DATA:  67 year old female with history of shortness of breath which is chronic but progressively worsening. EXAM: CT CHEST WITHOUT CONTRAST TECHNIQUE: Multidetector CT imaging of the chest was performed following the standard protocol without intravenous contrast. High resolution imaging of the lungs, as well as inspiratory and expiratory imaging, was performed. COMPARISON:  Chest CT 10/20/2017. FINDINGS: Cardiovascular: Heart size is normal. There is no significant pericardial fluid,  thickening or pericardial calcification. There is aortic atherosclerosis, as well as atherosclerosis of the great vessels of the mediastinum and the coronary arteries, including calcified atherosclerotic plaque in the left main, left anterior descending and right coronary arteries. Calcifications of the aortic valve. Pulmonic trunk is mildly dilated measuring 3.7 cm in diameter. Mediastinum/Nodes: No pathologically enlarged mediastinal or hilar lymph nodes. Please note that accurate exclusion of hilar adenopathy is limited on noncontrast CT scans. Small hiatal hernia. No axillary lymphadenopathy. Asymmetric enlargement of the right lobe of the thyroid gland with a heterogeneous appearing nodule extending off the inferior aspect of the right lobe of the gland measuring 2.9 x 2.0 cm, similar to the prior study. Lungs/Pleura: High-resolution images demonstrate widespread areas of predominantly ground-glass attenuation which are most severe throughout the mid to lower lungs bilaterally. In the areas of greatest involvement there is also extensive septal thickening, thickening of the peribronchovascular interstitium and cylindrical as well as mild varicose bronchiectasis. No honeycombing is identified. Inspiratory and expiratory imaging is unremarkable. No acute consolidative airspace disease. No pleural effusions. No suspicious appearing pulmonary nodules or masses are noted. Upper Abdomen: Unremarkable. Musculoskeletal: There are no aggressive appearing lytic or blastic lesions noted in the visualized portions of the skeleton. IMPRESSION: 1. 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 Although there is no definite honeycombing at this time, given the strong craniocaudal gradient and lack of air trapping, findings are strongly suspicious for UIP. Repeat high-resolution chest CT is suggested in 12 months to assess for temporal changes in the appearance of the lung parenchyma. 2. Mild dilatation of the  pulmonic trunk (3.7 cm in diameter), which may suggest an associated pulmonary arterial hypertension. 3. Aortic atherosclerosis, in addition to left main and 2 vessel coronary artery disease. Please note that although the presence of coronary artery calcium documents the presence of coronary artery disease, the severity of this disease and any potential stenosis cannot be assessed on this non-gated CT examination. Assessment for potential risk factor modification, dietary therapy or pharmacologic therapy may be warranted, if clinically indicated. 4. There are calcifications of the aortic valve. Echocardiographic correlation for evaluation of potential valvular dysfunction may be warranted if clinically indicated. 5. Nodular enlargement of the inferior aspect of the right lobe of the thyroid gland which measures 2.9 x 2.0 cm. Further evaluation with nonemergent thyroid ultrasound is suggested in the near future to better evaluate this lesion and determine potential need for fine-needle aspiration. Aortic Atherosclerosis (ICD10-I70.0). Electronically Signed   By: Vinnie Langton M.D.   On: 12/07/2017 11:25   US Thyroid  Result Date: 12/11/2017 CLINICAL DATA:  67 year old female with a history of thyroid nodules EXAM: THYROID ULTRASOUND TECHNIQUE: Ultrasound examination of the thyroid gland and adjacent soft tissues was performed. COMPARISON:  None. FINDINGS: Parenchymal  Echotexture: Moderately heterogenous Isthmus: 0.9 cm Right lobe: 5.1 cm x 1.9 cm x 2.3 cm Left lobe: 4.4 cm x 1.5 cm x 1.1 cm _________________________________________________________ Estimated total number of nodules >/= 1 cm: 3 Number of spongiform nodules >/=  2 cm not described below (TR1): 0 Number of mixed cystic and solid nodules >/= 1.5 cm not described below (TR2): 0 _________________________________________________________ Nodule # 1: Location: Isthmus; Superior Maximum size: 1.46 cm; Other 2 dimensions: 0.9 cm x 1.2 cm Composition:  solid/almost completely solid (2) Echogenicity: hypoechoic (2) Shape: not taller-than-wide (0) Margins: ill-defined (0) Echogenic foci: none (0) ACR TI-RADS total points: 4. ACR TI-RADS risk category: TR4 (4-6 points). ACR TI-RADS recommendations: Nodule meets criteria for surveillance _________________________________________________________ Nodule # 2: Location: Right; Superior Maximum size: 1.6 cm; Other 2 dimensions: 0.7 cm x 1.5 cm Composition: mixed cystic and solid (1) Echogenicity: hypoechoic (2) Shape: not taller-than-wide (0) Margins: ill-defined (0) Echogenic foci: none (0) ACR TI-RADS total points: 3. ACR TI-RADS risk category: TR3 (3 points). ACR TI-RADS recommendations: Nodule meets criteria for surveillance _________________________________________________________ Nodule # 3: Location: Right; Inferior Maximum size: 2.2 cm; Other 2 dimensions: 2.0 cm x 1.8 cm Composition: solid/almost completely solid (2) Echogenicity: isoechoic (1) Shape: taller-than-wide (3) Margins: ill-defined (0) Echogenic foci: none (0) ACR TI-RADS total points: 6. ACR TI-RADS risk category: TR4 (4-6 points). ACR TI-RADS recommendations: Nodule meets criteria for biopsy _________________________________________________________ No adenopathy IMPRESSION: Right inferior thyroid nodule (labeled 3) meets criteria for biopsy, if indeed not a pseudo nodule, as designated by the newly established ACR TI-RADS criteria, and referral for biopsy is recommended. The isthmic and right superior thyroid nodules (labeled 1 and 2) meet criteria for surveillance, as designated by the newly established ACR TI-RADS criteria. Surveillance ultrasound study recommended to be performed annually up to 5 years. Recommendations follow those established by the new ACR TI-RADS criteria (J Am Coll Radiol 7035;00:938-182). Electronically Signed   By: Corrie Mckusick D.O.   On: 12/11/2017 13:09    Lab Data:  CBC: Recent Labs  Lab 12/21/17 2142  12/27/17 1046 12/28/17 0406  WBC 3.8* 4.4 3.6*  NEUTROABS  --  2.8  --   HGB 11.3* 11.5* 11.0*  HCT 35.9* 37.8 35.4*  MCV 76.5* 77.9* 75.6*  PLT 289 259 993   Basic Metabolic Panel: Recent Labs  Lab 12/21/17 2142 12/27/17 1046 12/28/17 0406  NA 139 137 138  K 3.3* 4.3 4.0  CL 105 101 104  CO2 24 21* 25  GLUCOSE 84 59* 96  BUN 7* 24* 22  CREATININE 1.45* 2.17* 1.99*  CALCIUM 8.7* 9.0 8.8*   GFR: Estimated Creatinine Clearance: 34.6 mL/min (A) (by C-G formula based on SCr of 1.99 mg/dL (H)). Liver Function Tests: Recent Labs  Lab 12/21/17 2142 12/27/17 1046  AST 58* 95*  ALT 34 62*  ALKPHOS 61 64  BILITOT 0.8 1.4*  PROT 7.7 8.3*  ALBUMIN 3.0* 3.1*   Recent Labs  Lab 12/21/17 2142 12/27/17 1046  LIPASE 51 66*   No results for input(s): AMMONIA in the last 168 hours. Coagulation Profile: No results for input(s): INR, PROTIME in the last 168 hours. Cardiac Enzymes: Recent Labs  Lab 12/22/17 0724 12/22/17 0919 12/22/17 1239  TROPONINI 0.04* 0.04* 0.04*   BNP (last 3 results) No results for input(s): PROBNP in the last 8760 hours. HbA1C: No results for input(s): HGBA1C in the last 72 hours. CBG: Recent Labs  Lab 12/27/17 1617 12/27/17 2015 12/27/17 2059 12/27/17 2124 12/28/17 0508  GLUCAP  62* 50* 37* 157* 96   Lipid Profile: No results for input(s): CHOL, HDL, LDLCALC, TRIG, CHOLHDL, LDLDIRECT in the last 72 hours. Thyroid Function Tests: No results for input(s): TSH, T4TOTAL, FREET4, T3FREE, THYROIDAB in the last 72 hours. Anemia Panel: No results for input(s): VITAMINB12, FOLATE, FERRITIN, TIBC, IRON, RETICCTPCT in the last 72 hours. Urine analysis:    Component Value Date/Time   COLORURINE YELLOW 11/17/2017 1323   APPEARANCEUR CLOUDY (A) 11/17/2017 1323   LABSPEC 1.015 11/17/2017 1323   PHURINE 5.0 11/17/2017 1323   GLUCOSEU NEGATIVE 11/17/2017 1323   GLUCOSEU NEGATIVE 11/30/2013 1057   HGBUR MODERATE (A) 11/17/2017 1323   BILIRUBINUR  NEGATIVE 11/17/2017 1323   KETONESUR NEGATIVE 11/17/2017 1323   PROTEINUR 100 (A) 11/17/2017 1323   UROBILINOGEN 0.2 11/30/2013 1057   NITRITE NEGATIVE 11/17/2017 1323   LEUKOCYTESUR LARGE (A) 11/17/2017 1323     Ripudeep Rai M.D. [facility] 12/28/2017, 12:08 PM  Pager: [phone] Between 7am to 7pm - call Pager - [phone]  After 7pm go to [email or web address] - password [email or web address]  Call night coverage person covering after 7pm

## 2017-12-28 NOTE — H&P (Signed)
Interval H&P  Patient to undergo an EGD for evaluation of significant abdominal pain as well as nausea and associated vomiting.  Will rule out esophageal pathology in setting of solid food and pill dysphagia.  Examination is unchanged since evaluation and discussion with patient at bedside during consultation this AM.  The risks and benefits of endoscopic evaluation were discussed with the patient; these include but are not limited to the risk of perforation, infection, bleeding, missed lesions, lack of diagnosis, severe illness requiring hospitalization, as well as anesthesia and sedation related illnesses.  I also discussed this with her son on the phone with her at the bedside.  The patient and son are agreeable to proceed with EGD understanding these risks.  We will proceed when Anesthesia can help with sedation due to her other comorbidities.   Justice Britain, MD [facility] Office # [phone]

## 2017-12-29 ENCOUNTER — Ambulatory Visit: Payer: Self-pay

## 2017-12-29 ENCOUNTER — Ambulatory Visit: Payer: Medicare Other | Admitting: Pharmacist

## 2017-12-29 ENCOUNTER — Encounter (HOSPITAL_COMMUNITY): Payer: Self-pay | Admitting: Gastroenterology

## 2017-12-29 ENCOUNTER — Ambulatory Visit: Payer: Self-pay | Admitting: Pharmacist

## 2017-12-29 DIAGNOSIS — J45909 Unspecified asthma, uncomplicated: Secondary | ICD-10-CM | POA: Diagnosis not present

## 2017-12-29 DIAGNOSIS — K228 Other specified diseases of esophagus: Secondary | ICD-10-CM | POA: Diagnosis not present

## 2017-12-29 DIAGNOSIS — I10 Essential (primary) hypertension: Secondary | ICD-10-CM

## 2017-12-29 DIAGNOSIS — D509 Iron deficiency anemia, unspecified: Secondary | ICD-10-CM

## 2017-12-29 DIAGNOSIS — I130 Hypertensive heart and chronic kidney disease with heart failure and stage 1 through stage 4 chronic kidney disease, or unspecified chronic kidney disease: Secondary | ICD-10-CM | POA: Diagnosis not present

## 2017-12-29 DIAGNOSIS — G4733 Obstructive sleep apnea (adult) (pediatric): Secondary | ICD-10-CM | POA: Diagnosis not present

## 2017-12-29 DIAGNOSIS — G4700 Insomnia, unspecified: Secondary | ICD-10-CM | POA: Diagnosis not present

## 2017-12-29 DIAGNOSIS — E1122 Type 2 diabetes mellitus with diabetic chronic kidney disease: Secondary | ICD-10-CM | POA: Diagnosis not present

## 2017-12-29 DIAGNOSIS — K449 Diaphragmatic hernia without obstruction or gangrene: Secondary | ICD-10-CM | POA: Diagnosis not present

## 2017-12-29 DIAGNOSIS — J9611 Chronic respiratory failure with hypoxia: Secondary | ICD-10-CM | POA: Diagnosis not present

## 2017-12-29 DIAGNOSIS — B9681 Helicobacter pylori [H. pylori] as the cause of diseases classified elsewhere: Secondary | ICD-10-CM | POA: Diagnosis not present

## 2017-12-29 DIAGNOSIS — K209 Esophagitis, unspecified: Secondary | ICD-10-CM | POA: Diagnosis not present

## 2017-12-29 DIAGNOSIS — N183 Chronic kidney disease, stage 3 (moderate): Secondary | ICD-10-CM | POA: Diagnosis not present

## 2017-12-29 DIAGNOSIS — N179 Acute kidney failure, unspecified: Secondary | ICD-10-CM | POA: Diagnosis not present

## 2017-12-29 DIAGNOSIS — E785 Hyperlipidemia, unspecified: Secondary | ICD-10-CM | POA: Diagnosis not present

## 2017-12-29 DIAGNOSIS — R160 Hepatomegaly, not elsewhere classified: Secondary | ICD-10-CM | POA: Diagnosis not present

## 2017-12-29 DIAGNOSIS — E11649 Type 2 diabetes mellitus with hypoglycemia without coma: Secondary | ICD-10-CM | POA: Diagnosis not present

## 2017-12-29 DIAGNOSIS — E1143 Type 2 diabetes mellitus with diabetic autonomic (poly)neuropathy: Secondary | ICD-10-CM | POA: Diagnosis not present

## 2017-12-29 DIAGNOSIS — E039 Hypothyroidism, unspecified: Secondary | ICD-10-CM | POA: Diagnosis not present

## 2017-12-29 DIAGNOSIS — I5032 Chronic diastolic (congestive) heart failure: Secondary | ICD-10-CM | POA: Diagnosis not present

## 2017-12-29 DIAGNOSIS — K224 Dyskinesia of esophagus: Secondary | ICD-10-CM | POA: Diagnosis not present

## 2017-12-29 DIAGNOSIS — K219 Gastro-esophageal reflux disease without esophagitis: Secondary | ICD-10-CM | POA: Diagnosis not present

## 2017-12-29 DIAGNOSIS — R1310 Dysphagia, unspecified: Secondary | ICD-10-CM

## 2017-12-29 LAB — BASIC METABOLIC PANEL
Anion gap: 9 (ref 5–15)
BUN: 15 mg/dL (ref 8–23)
CO2: 25 mmol/L (ref 22–32)
CREATININE: 1.63 mg/dL — AB (ref 0.44–1.00)
Calcium: 8.6 mg/dL — ABNORMAL LOW (ref 8.9–10.3)
Chloride: 103 mmol/L (ref 98–111)
GFR calc Af Amer: 36 mL/min — ABNORMAL LOW (ref >60)
GFR, EST NON AFRICAN AMERICAN: 31 mL/min — AB (ref >60)
GLUCOSE: 125 mg/dL — AB (ref 70–99)
POTASSIUM: 3.5 mmol/L (ref 3.5–5.1)
SODIUM: 137 mmol/L (ref 135–145)

## 2017-12-29 LAB — GLUCOSE, CAPILLARY
GLUCOSE-CAPILLARY: 92 mg/dL (ref 70–99)
Glucose-Capillary: 121 mg/dL — ABNORMAL HIGH (ref 70–99)
Glucose-Capillary: 97 mg/dL (ref 70–99)
Glucose-Capillary: 120 mg/dL — ABNORMAL HIGH (ref 70–99)

## 2017-12-29 LAB — FERRITIN: FERRITIN: 291 ng/mL (ref 11–307)

## 2017-12-29 LAB — IRON AND TIBC
Iron: 43 ug/dL (ref 28–170)
Saturation Ratios: 24 % (ref 10.4–31.8)
TIBC: 176 ug/dL — ABNORMAL LOW (ref 250–450)
UIBC: 133 ug/dL

## 2017-12-29 LAB — CBC
HEMATOCRIT: 34.3 % — AB (ref 36.0–46.0)
Hemoglobin: 10.9 g/dL — ABNORMAL LOW (ref 12.0–15.0)
MCH: 24.1 pg — ABNORMAL LOW (ref 26.0–34.0)
MCHC: 31.8 g/dL (ref 30.0–36.0)
MCV: 75.7 fL — AB (ref 78.0–100.0)
PLATELETS: 229 10*3/uL (ref 150–400)
RBC: 4.53 MIL/uL (ref 3.87–5.11)
RDW: 22.3 % — AB (ref 11.5–15.5)
WBC: 2.8 10*3/uL — AB (ref 4.0–10.5)

## 2017-12-29 LAB — FOLATE: FOLATE: 6.1 ng/mL (ref >5.9)

## 2017-12-29 LAB — RETICULOCYTES
RBC.: 4.53 MIL/uL (ref 3.87–5.11)
RETIC COUNT ABSOLUTE: 22.7 10*3/uL (ref 19.0–186.0)
Retic Ct Pct: 0.5 % (ref 0.4–3.1)

## 2017-12-29 LAB — VITAMIN B12: Vitamin B-12: 597 pg/mL (ref 180–914)

## 2017-12-29 MED ORDER — PRO-STAT SUGAR FREE PO LIQD
30.0000 mL | Freq: Two times a day (BID) | ORAL | Status: DC
  Administered 2017-12-30: 30 mL via ORAL
  Filled 2017-12-29: qty 30

## 2017-12-29 MED ORDER — FAMOTIDINE 20 MG PO TABS: 20.0000 mg | ORAL_TABLET | Freq: Every day | ORAL | 2 refills | Status: DC | PRN

## 2017-12-29 MED ORDER — TRAMADOL HCL 50 MG PO TABS
50.0000 mg | ORAL_TABLET | Freq: Once | ORAL | Status: AC
  Administered 2017-12-29: 50 mg via ORAL
  Filled 2017-12-29: qty 1

## 2017-12-29 MED ORDER — ONDANSETRON 4 MG PO TBDP: 4.0000 mg | ORAL_TABLET | Freq: Three times a day (TID) | ORAL | 0 refills | Status: DC | PRN

## 2017-12-29 MED ORDER — FAMOTIDINE 20 MG PO TABS
20.0000 mg | ORAL_TABLET | Freq: Every day | ORAL | Status: DC | PRN
  Administered 2017-12-29: 20 mg via ORAL
  Filled 2017-12-29: qty 1

## 2017-12-29 NOTE — Discharge Summary (Signed)
Physician Discharge Summary   Patient ID: [name] MRN: [id number] DOB/AGE: [birth year] 67 y.o.  Admit date: 12/27/2017 Discharge date: 12/29/2017  Primary Care Physician:  Colon Branch, MD   Recommendations for Outpatient Follow-up:  Follow up with PCP in 1-2 weeks Follow-up with gastroenterology in 2 weeks  Home Health: Patient recommended skilled nursing facility Equipment/Devices:   Discharge Condition: stable CODE STATUS: FULL  Diet recommendation: Patient recommended small frequent meals, sit upright, small bites, chew well   Discharge Diagnoses:    Acute on chronic postprandial nausea and vomiting . AKI (acute kidney injury) ([place]) . History of recent pericarditis . Essential hypertension . GERD (gastroesophageal reflux disease) . Morbid obesity ([place]) . Chronic respiratory failure with hypoxia, O2 dependent Obstructive sleep apnea, on CPAP   Consults: Gastroenterology    Allergies:  No Known Allergies   DISCHARGE MEDICATIONS: Allergies as of 12/29/2017   No Known Allergies     Medication List    STOP taking these medications   furosemide 20 MG tablet Commonly known as:  LASIX   potassium chloride 10 MEQ tablet Commonly known as:  K-DUR,KLOR-CON   sitaGLIPtin 50 MG tablet Commonly known as:  JANUVIA     TAKE these medications   ACCU-CHEK AVIVA PLUS test strip Generic drug:  glucose blood TEST BLOD SUGAR TWICE DAILY AND LANCETS TWICE DAILY   ACCU-CHEK SOFTCLIX LANCETS lancets USE TO CHECK BLOOD SUGAR TWICE A DAY   acetaminophen 325 MG tablet Commonly known as:  TYLENOL Take 2 tablets (650 mg total) by mouth every 6 (six) hours as needed for mild pain (or Fever >/= 101). What changed:  how much to take   albuterol 108 (90 Base) MCG/ACT inhaler Commonly known as:  PROVENTIL HFA;VENTOLIN HFA Inhale 2 puffs into the lungs every 4 (four) hours as needed for wheezing.   aspirin EC 81 MG tablet Take 1 tablet (81 mg total) by mouth  daily. With food   budesonide-formoterol 160-4.5 MCG/ACT inhaler Commonly known as:  SYMBICORT Inhale 2 puffs into the lungs 2 (two) times daily.   colchicine 0.6 MG tablet Take 1 tablet (0.6 mg total) by mouth 2 (two) times daily.   escitalopram 10 MG tablet Commonly known as:  LEXAPRO Take 1 tablet (10 mg total) by mouth daily.   famotidine 20 MG tablet Commonly known as:  PEPCID Take 1 tablet (20 mg total) by mouth daily as needed for heartburn or indigestion.   latanoprost 0.005 % ophthalmic solution Commonly known as:  XALATAN USE 1 DROP IN BOTH EYES AT BEDTIME What changed:    how much to take  how to take this  when to take this   levothyroxine 75 MCG tablet Commonly known as:  SYNTHROID, LEVOTHROID Take 1 tablet (75 mcg total) by mouth daily before breakfast.   Melatonin 3 MG Tabs Take 1-2 tablets (3-6 mg total) by mouth at bedtime as needed (insomnia).   metoprolol tartrate 25 MG tablet Commonly known as:  LOPRESSOR Take 1.5 tablets (37.5 mg total) by mouth 2 (two) times daily.   ondansetron 4 MG disintegrating tablet Commonly known as:  ZOFRAN-ODT Take 1 tablet (4 mg total) by mouth every 8 (eight) hours as needed for nausea or vomiting.   OVER THE COUNTER MEDICATION CPAP   OXYGEN Inhale 2 L into the lungs continuous.   pantoprazole 40 MG tablet Commonly known as:  PROTONIX Take 1 tablet (40 mg total) by mouth daily before breakfast.   rosuvastatin 40 MG tablet Commonly  known as:  CRESTOR Take 1 tablet (40 mg total) by mouth daily. What changed:  when to take this        Brief H and P: For complete details please refer to admission H and P, but in briefPatient is a 67 year old female with obesity, chronic hypoxic respiratory failure with 3 L nasal cannula O2 at baseline, asthma, OSA on CPAP at bedtime, diastolic CHF, history of pericarditis in June/19, hypothyroidism, non-insulin-dependent diabetes, hypertension, CKD, baseline creatinine 1.5  presented with chronic nausea and vomiting, abdominal pain.  Patient reports symptoms for last 3 to 4 months, prior to admission in July, barium swallow was performed which was suggestive of esophageal motility disorder.  Patient also reports about 50 pound weight loss in the last few months due to not able to eat, states that she has not been able to tolerate food, drink other than water, most of the time she has nausea and vomiting after trying to eat.  Patient was admitted for further work-up.   Hospital Course:   Acute on chronic postprandial nausea, with vomiting, weight loss -Prior barium swallow suggestive of esophageal motility disorder -Continue PPI -Random cortisol level normal.  CT abdomen pelvis with no intra-abdominal pathology -GI was consulted.  Patient underwent EGD which showed tortuous esophagus, no other gross lesions, medium sized hiatal hernia with erythematous mucosa in the greater curvature of the gastric antrum.  Duodenum normal. -Currently tolerating solid diet, patient recommended small frequent meals, sit upright and chew well.  Outpatient follow-up with GI for biopsy results     DM II (diabetes mellitus, type II), controlled ([place]) -Patient had hypoglycemia while inpatient, hemoglobin A1c 5.6 on 6/11 -Reports loss of 50 pounds over the last 3 to 4 months and not been eating well due to #1 -Hold Januvia outpatient and follow-up with PCP, may not need to be on any oral hypoglycemics    Essential hypertension, chronic diastolic CHF -BNP> 174 however does not appear to be volume overloaded, -Due to dehydration, poor oral intake, creatinine 2.17 at the time of admission, hold Lasix for now, creatinine improving    GERD (gastroesophageal reflux disease) -Continue PPI  History of pericarditis -Most recent TTE without significant effusion, reviewed office cardiology notes, patient to continue colchicine with PPI until mid-September -Will need repeat 2D echo, defer to  cardiology recommendations outpatient    Morbid obesity ([place]) -BMI 33.4, follow diet and exercise    AKI (acute kidney injury) ([place]) on CKD stage III -Baseline creatinine 1.4-1.5, admitted with creatinine of 2.17 secondary to dehydration, poor oral intake -Patient was placed on gentle hydration, creatinine now improving 1.6, Lasix on hold  Chronic respiratory failure with hypoxia, O2 dependent, OSA -Continue CPAP, O2 supplementation  Day of Discharge S: Feeling somewhat better now, able to tolerate breakfast without any difficulty.  BP 94/64 (BP Location: Left Arm)   Pulse 97   Temp (!) 97.5 F (36.4 C) (Oral)   Resp 18   Ht 5\' 9"  (1.753 m)   Wt 102.9 kg   SpO2 98%   BMI 33.50 kg/m   Physical Exam: General: Alert and awake oriented x3 not in any acute distress. HEENT: anicteric sclera, pupils reactive to light and accommodation CVS: S1-S2 clear no murmur rubs or gallops Chest: clear to auscultation bilaterally, no wheezing rales or rhonchi Abdomen: soft nontender, nondistended, normal bowel sounds Extremities: no cyanosis, clubbing or edema noted bilaterally Neuro: Cranial nerves II-XII intact, no focal neurological deficits   The results of significant diagnostics from  this hospitalization (including imaging, microbiology, ancillary and laboratory) are listed below for reference.      Procedures/Studies:  Ct Abdomen Pelvis Wo Contrast  Result Date: 12/27/2017 CLINICAL DATA:  Diffuse abdominal pain for a few months, nausea and vomiting. History of hemorrhoids, urinary tract infection. EXAM: CT ABDOMEN AND PELVIS WITHOUT CONTRAST TECHNIQUE: Multidetector CT imaging of the abdomen and pelvis was performed following the standard protocol without IV contrast. COMPARISON:  CT chest December 22, 2017. Renal ultrasound November 18, 2017. CT lumbar spine April 28, 2006. FINDINGS: LOWER CHEST: Bilateral lower lobe bronchiectasis with confluent peripheral ground-glass opacities,  subpleural sparing. Included heart size is normal. No pericardial effusion. Gas distended esophagus associated with reflux. HEPATOBILIARY: Layering density within gallbladder, likely vicarious excretion of contrast. No CT findings of acute cholecystitis. Normal gallbladder. PANCREAS: Normal. SPLEEN: Normal. ADRENALS/URINARY TRACT: Kidneys are orthotopic, demonstrating normal size and morphology. Bilateral cortical scarring. 7 mm crescentic calcification along LEFT vascular pedicle could reflect aneurysm. 12 mm cyst lower pole RIGHT kidney. No nephrolithiasis, hydronephrosis; limited assessment for renal masses by nonenhanced CT. The unopacified ureters are normal in course and caliber. Urinary bladder is decompressed and unremarkable. Normal adrenal glands. STOMACH/BOWEL: Very small hiatal hernia. The stomach, small and large bowel are normal in course and caliber without inflammatory change. Mild colonic diverticulosis. Normal appendix. VASCULAR/LYMPHATIC: Aortoiliac vessels are normal in course and caliber. Mild calcific atherosclerosis. No lymphadenopathy by CT size criteria. REPRODUCTIVE: Normal. OTHER: No intraperitoneal free fluid or free air. MUSCULOSKELETAL: Non-acute. Severe pubic symphyseal osteoarthrosis. Severe lower lumbar facet arthropathy. Moderate hip osteoarthrosis. IMPRESSION: 1. Colonic diverticulosis without acute diverticulitis nor acute intra-abdominal/pelvic process. 2. Bibasilar ground-glass opacities with subpleural sparing associated with nonspecific interstitial pneumonia. Bilateral lower lobe bronchiectasis. Aortic Atherosclerosis (ICD10-I70.0). Electronically Signed   By: Elon Alas M.D.   On: 12/27/2017 18:00   Dg Chest 2 View  Result Date: 12/27/2017 CLINICAL DATA:  Generalized chest pain. EXAM: CHEST - 2 VIEW COMPARISON:  Chest CT dated December 22, 2017. Chest x-ray dated December 21, 2017. FINDINGS: The heart size and mediastinal contours are within normal limits. Normal  pulmonary vascularity. Atherosclerotic calcification of the aortic arch. Patchy opacities at the lung bases are similar to prior studies. No new focal infiltrate. No pleural effusion or pneumothorax. No acute osseous abnormality. IMPRESSION: 1. Stable bibasilar patchy airspace disease, better evaluated on recent chest CT. Electronically Signed   By: Titus Dubin M.D.   On: 12/27/2017 11:18   Dg Chest 2 View  Result Date: 12/21/2017 CLINICAL DATA:  Chest pain and vomiting EXAM: CHEST - 2 VIEW COMPARISON:  12/07/2017 CT of the chest FINDINGS: Cardiac shadow is mildly enlarged. Aortic calcifications are again seen. Patchy changes in the bases are again seen similar to that noted on prior CT examination. No new focal infiltrate or sizable effusion is seen. No bony abnormality is noted. IMPRESSION: Stable bibasilar changes consistent with the prior CT. Electronically Signed   By: Inez Catalina M.D.   On: 12/21/2017 22:14   Ct Angio Chest Pe W And/or Wo Contrast  Result Date: 12/22/2017 CLINICAL DATA:  Sharp chest pain starting last night. Pain on palpation. Shortness of breath. EXAM: CT ANGIOGRAPHY CHEST WITH CONTRAST TECHNIQUE: Multidetector CT imaging of the chest was performed using the standard protocol during bolus administration of intravenous contrast. Multiplanar CT image reconstructions and MIPs were obtained to evaluate the vascular anatomy. CONTRAST:  158mL ISOVUE-370 IOPAMIDOL (ISOVUE-370) INJECTION 76% COMPARISON:  12/07/2017 FINDINGS: Cardiovascular: There is good opacification of the central  and segmental pulmonary arteries. No focal filling defects. No evidence of significant pulmonary embolus. Normal caliber thoracic aorta with scattered calcification. Normal heart size. No pericardial effusions. Mediastinum/Nodes: No significant lymphadenopathy in the chest. Scattered lymph nodes are not pathologically enlarged. Esophagus is not significantly dilated but there is an air-fluid level with fluid  in the esophagus. This may be due to reflux or dysmotility. Distal obstructing lesion is not identified. Similar appearance to previous study. Right adrenal gland nodule again demonstrated. Lungs/Pleura: Diffuse airspace disease throughout the lungs, most prominent in the bases. Airways are patent with mild bronchiectasis. No focal consolidation. No pleural effusions. No pneumothorax. Similar appearance to previous study. Upper Abdomen: No acute changes identified. Musculoskeletal: No chest wall abnormality. No acute or significant osseous findings. Review of the MIP images confirms the above findings. IMPRESSION: 1. No evidence of significant pulmonary embolus. 2. Diffuse airspace disease throughout the lungs, most prominent in the bases. This could represent pneumonia or edema. Similar appearance to previous study. 3. Air-fluid level in the esophagus may be due to reflux or dysmotility. Aortic Atherosclerosis (ICD10-I70.0). Electronically Signed   By: Lucienne Capers M.D.   On: 12/22/2017 04:21   Ct Chest High Resolution  Result Date: 12/07/2017 CLINICAL DATA:  67 year old female with history of shortness of breath which is chronic but progressively worsening. EXAM: CT CHEST WITHOUT CONTRAST TECHNIQUE: Multidetector CT imaging of the chest was performed following the standard protocol without intravenous contrast. High resolution imaging of the lungs, as well as inspiratory and expiratory imaging, was performed. COMPARISON:  Chest CT 10/20/2017. FINDINGS: Cardiovascular: Heart size is normal. There is no significant pericardial fluid, thickening or pericardial calcification. There is aortic atherosclerosis, as well as atherosclerosis of the great vessels of the mediastinum and the coronary arteries, including calcified atherosclerotic plaque in the left main, left anterior descending and right coronary arteries. Calcifications of the aortic valve. Pulmonic trunk is mildly dilated measuring 3.7 cm in diameter.  Mediastinum/Nodes: No pathologically enlarged mediastinal or hilar lymph nodes. Please note that accurate exclusion of hilar adenopathy is limited on noncontrast CT scans. Small hiatal hernia. No axillary lymphadenopathy. Asymmetric enlargement of the right lobe of the thyroid gland with a heterogeneous appearing nodule extending off the inferior aspect of the right lobe of the gland measuring 2.9 x 2.0 cm, similar to the prior study. Lungs/Pleura: High-resolution images demonstrate widespread areas of predominantly ground-glass attenuation which are most severe throughout the mid to lower lungs bilaterally. In the areas of greatest involvement there is also extensive septal thickening, thickening of the peribronchovascular interstitium and cylindrical as well as mild varicose bronchiectasis. No honeycombing is identified. Inspiratory and expiratory imaging is unremarkable. No acute consolidative airspace disease. No pleural effusions. No suspicious appearing pulmonary nodules or masses are noted. Upper Abdomen: Unremarkable. Musculoskeletal: There are no aggressive appearing lytic or blastic lesions noted in the visualized portions of the skeleton. IMPRESSION: 1. 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 Although there is no definite honeycombing at this time, given the strong craniocaudal gradient and lack of air trapping, findings are strongly suspicious for UIP. Repeat high-resolution chest CT is suggested in 12 months to assess for temporal changes in the appearance of the lung parenchyma. 2. Mild dilatation of the pulmonic trunk (3.7 cm in diameter), which may suggest an associated pulmonary arterial hypertension. 3. Aortic atherosclerosis, in addition to left main and 2 vessel coronary artery disease. Please note that although the presence of coronary artery calcium documents the presence of coronary artery disease,  the severity of this disease and any potential stenosis cannot be assessed on  this non-gated CT examination. Assessment for potential risk factor modification, dietary therapy or pharmacologic therapy may be warranted, if clinically indicated. 4. There are calcifications of the aortic valve. Echocardiographic correlation for evaluation of potential valvular dysfunction may be warranted if clinically indicated. 5. Nodular enlargement of the inferior aspect of the right lobe of the thyroid gland which measures 2.9 x 2.0 cm. Further evaluation with nonemergent thyroid ultrasound is suggested in the near future to better evaluate this lesion and determine potential need for fine-needle aspiration. Aortic Atherosclerosis (ICD10-I70.0). Electronically Signed   By: Vinnie Langton M.D.   On: 12/07/2017 11:25   US Thyroid  Result Date: 12/11/2017 CLINICAL DATA:  67 year old female with a history of thyroid nodules EXAM: THYROID ULTRASOUND TECHNIQUE: Ultrasound examination of the thyroid gland and adjacent soft tissues was performed. COMPARISON:  None. FINDINGS: Parenchymal Echotexture: Moderately heterogenous Isthmus: 0.9 cm Right lobe: 5.1 cm x 1.9 cm x 2.3 cm Left lobe: 4.4 cm x 1.5 cm x 1.1 cm _________________________________________________________ Estimated total number of nodules >/= 1 cm: 3 Number of spongiform nodules >/=  2 cm not described below (TR1): 0 Number of mixed cystic and solid nodules >/= 1.5 cm not described below (TR2): 0 _________________________________________________________ Nodule # 1: Location: Isthmus; Superior Maximum size: 1.46 cm; Other 2 dimensions: 0.9 cm x 1.2 cm Composition: solid/almost completely solid (2) Echogenicity: hypoechoic (2) Shape: not taller-than-wide (0) Margins: ill-defined (0) Echogenic foci: none (0) ACR TI-RADS total points: 4. ACR TI-RADS risk category: TR4 (4-6 points). ACR TI-RADS recommendations: Nodule meets criteria for surveillance _________________________________________________________ Nodule # 2: Location: Right; Superior Maximum  size: 1.6 cm; Other 2 dimensions: 0.7 cm x 1.5 cm Composition: mixed cystic and solid (1) Echogenicity: hypoechoic (2) Shape: not taller-than-wide (0) Margins: ill-defined (0) Echogenic foci: none (0) ACR TI-RADS total points: 3. ACR TI-RADS risk category: TR3 (3 points). ACR TI-RADS recommendations: Nodule meets criteria for surveillance _________________________________________________________ Nodule # 3: Location: Right; Inferior Maximum size: 2.2 cm; Other 2 dimensions: 2.0 cm x 1.8 cm Composition: solid/almost completely solid (2) Echogenicity: isoechoic (1) Shape: taller-than-wide (3) Margins: ill-defined (0) Echogenic foci: none (0) ACR TI-RADS total points: 6. ACR TI-RADS risk category: TR4 (4-6 points). ACR TI-RADS recommendations: Nodule meets criteria for biopsy _________________________________________________________ No adenopathy IMPRESSION: Right inferior thyroid nodule (labeled 3) meets criteria for biopsy, if indeed not a pseudo nodule, as designated by the newly established ACR TI-RADS criteria, and referral for biopsy is recommended. The isthmic and right superior thyroid nodules (labeled 1 and 2) meet criteria for surveillance, as designated by the newly established ACR TI-RADS criteria. Surveillance ultrasound study recommended to be performed annually up to 5 years. Recommendations follow those established by the new ACR TI-RADS criteria (J Am Coll Radiol 7846;96:295-284). Electronically Signed   By: Corrie Mckusick D.O.   On: 12/11/2017 13:09      LAB RESULTS: Basic Metabolic Panel: Recent Labs  Lab 12/28/17 0406 12/29/17 0648  NA 138 137  K 4.0 3.5  CL 104 103  CO2 25 25  GLUCOSE 96 125*  BUN 22 15  CREATININE 1.99* 1.63*  CALCIUM 8.8* 8.6*   Liver Function Tests: Recent Labs  Lab 12/27/17 1046  AST 95*  ALT 62*  ALKPHOS 64  BILITOT 1.4*  PROT 8.3*  ALBUMIN 3.1*   Recent Labs  Lab 12/27/17 1046  LIPASE 66*   No results for input(s): AMMONIA in the last 168  hours. CBC: Recent Labs  Lab 12/27/17 1046 12/28/17 0406 12/29/17 0648  WBC 4.4 3.6* 2.8*  NEUTROABS 2.8  --   --   HGB 11.5* 11.0* 10.9*  HCT 37.8 35.4* 34.3*  MCV 77.9* 75.6* 75.7*  PLT 259 271 229   Cardiac Enzymes: No results for input(s): CKTOTAL, CKMB, CKMBINDEX, TROPONINI in the last 168 hours. BNP: Invalid input(s): POCBNP CBG: Recent Labs  Lab 12/29/17 0605 12/29/17 1155  GLUCAP 121* 97      Disposition and Follow-up: Discharge Instructions    Diet - low sodium heart healthy   Complete by:  As directed    Discharge instructions   Complete by:  As directed    Always sit upright for meals. Small frequent meals, chew well, small bites. Follow up with GI doctor in office in 2 weeks.   Increase activity slowly   Complete by:  As directed        DISPOSITION: Skilled nursing facility  DISCHARGE FOLLOW-UP Follow-up Information    Mansouraty, Telford Nab., MD. Schedule an appointment as soon as possible for a visit in 2 week(s).   Specialties:  Gastroenterology, Internal Medicine Why:  GI for hospital follow-up Contact information: [place] [address] [phone]        Colon Branch, MD. Schedule an appointment as soon as possible for a visit in 2 week(s).   Specialty:  Internal Medicine Contact information: [address] [place] [address] [phone]        Sueanne Margarita, MD .   Specialty:  Cardiology Contact information: [address] [place] [address] [phone]            Time coordinating discharge:  52mins   Signed:   Estill Cotta M.D. [facility] 12/29/2017, 2:28 PM Pager: [phone]

## 2017-12-29 NOTE — Transfer of Care (Addendum)
Immediate Anesthesia Transfer of Care Note  Patient: [name]  Procedure(s) Performed: ESOPHAGOGASTRODUODENOSCOPY (EGD) WITH PROPOFOL (N/A ) BIOPSY  Patient Location: PACU  Anesthesia Type:MAC  Level of Consciousness: awake  Airway & Oxygen Therapy: Patient connected to nasal cannula oxygen  Post-op Assessment: Report given to RN  Post vital signs: Reviewed and stable  Last Vitals:  Vitals Value Taken Time  BP    Temp    Pulse    Resp    SpO2      Last Pain:  Vitals:   12/29/17 0836  TempSrc: Oral  PainSc:          Complications: No apparent anesthesia complications

## 2017-12-29 NOTE — Clinical Social Work Note (Signed)
Clinical Social Work Assessment  Patient Details  Name: [name] MRN: [id number] Date of Birth: [birth year]  Date of referral:  12/29/17               Reason for consult:  Facility Placement, Discharge Planning                Permission sought to share information with:  Family Supports Permission granted to share information::  Yes, Verbal Permission Granted  Name::     [name]   Agency::     Relationship::  Son  Contact Information:  [phone]  Housing/Transportation Living arrangements for the past 2 months:  Apartment Source of Information:  Patient Patient Interpreter Needed:  None Criminal Activity/Legal Involvement Pertinent to Current Situation/Hospitalization:  No - Comment as needed Significant Relationships:  Adult Children Lives with:  Self Do you feel safe going back to the place where you live?  No Need for family participation in patient care:  No (Coment)  Care giving concerns: Patient reported that she lives alone and expressed agreement with ST rehab.  Social Worker assessment / plan: CSW talked with patient at the bedside regarding discharge disposition and recommendation of ST rehab. [name] was sitting up in bed talking with the nurse case manager when CSW entered the room. Patient was alert, oriented and agreeable to talking with CSW. [name] reported that she lives alone in a one-level apartment, and her son [name] lives nearby and assists her as needed with transportation, etc. CSW advised that her son Declo lives in [place], [place]. When asked, patient gave CSW permission to contact her son [name].  Recommendation for ST rehab discussed and patient reported when asked that she has been to rehab before after total knee replacements. [name] indicated that before getting sick and coming to hospital, she was independent with ADL's. Facility search process explained and patient provided with SNF list.  Employment status:    Insurance  information:  Programmer, applications, Medicaid In [employer]) PT Recommendations:  [place] / Referral to community resources:  Skilled Nursing Facility(Patient provided with SNF list)  Patient/Family's Response to care:  No concerns expressed by patient regarding care during hospitalization.  Patient/Family's Understanding of and Emotional Response to Diagnosis, Current Treatment, and Prognosis:  Patient expressed understanding and agreement with going to a facility for ST rehab.  Emotional Assessment Appearance:  Appears older than stated age Attitude/Demeanor/Rapport:  Engaged Affect (typically observed):  Pleasant, Appropriate Orientation:  Oriented to Self, Oriented to Place, Oriented to  Time, Oriented to Situation Alcohol / Substance use:  Tobacco Use, Alcohol Use, Illicit Drugs(Per H&P, patient reported that she quit smoking and does not drink or use illicit drugs) Psych involvement (Current and /or in the community):  No (Comment)  Discharge Needs  Concerns to be addressed:  Discharge Planning Concerns Readmission within the last 30 days:  Yes Current discharge risk:  Lives alone Barriers to Discharge:  Ship broker, Programmer, applications ([place]), Other(Selecting a SNF)   Sable Feil, [place] 12/29/2017, 2:12 PM

## 2017-12-29 NOTE — Progress Notes (Signed)
Daily Rounding Note  12/29/2017, 12:13 PM  LOS: 2 days   SUBJECTIVE:   Chief complaint:     No vomiting today.  Tolerating solids, mostly sticking to soft foods.    OBJECTIVE:         Vital signs in last 24 hours:    Temp:  [97.5 F (36.4 C)-98.7 F (37.1 C)] 97.5 F (36.4 C) (08/20 0836) Pulse Rate:  [82-97] 97 (08/20 0836) Resp:  [16-22] 18 (08/20 0836) BP: (90-138)/(59-90) 94/64 (08/20 0836) SpO2:  [96 %-100 %] 98 % (08/20 0836) Last BM Date: 12/28/17 Filed Weights   12/28/17 0812  Weight: 102.9 kg   General: comfortable, NAD   Heart: RRR Chest: clear bil.   Abdomen: soft, NT, ND.  Active BS  Extremities: no CCE Neuro/Psych:  Pleasant, oriented x 3.    Intake/Output from previous day: 08/19 0701 - 08/20 0700 In: 443 [P.O.:240; I.V.:203] Out: 200 [Urine:200]  Intake/Output this shift: Total I/O In: 120 [P.O.:120] Out: 450 [Urine:450]  Lab Results: Recent Labs    12/27/17 1046 12/28/17 0406 12/29/17 0648  WBC 4.4 3.6* 2.8*  HGB 11.5* 11.0* 10.9*  HCT 37.8 35.4* 34.3*  PLT 259 271 229   BMET Recent Labs    12/27/17 1046 12/28/17 0406 12/29/17 0648  NA 137 138 137  K 4.3 4.0 3.5  CL 101 104 103  CO2 21* 25 25  GLUCOSE 59* 96 125*  BUN 24* 22 15  CREATININE 2.17* 1.99* 1.63*  CALCIUM 9.0 8.8* 8.6*   LFT Recent Labs    12/27/17 1046  PROT 8.3*  ALBUMIN 3.1*  AST 95*  ALT 62*  ALKPHOS 64  BILITOT 1.4*  BILIDIR 0.3*  IBILI 1.1*   PT/INR No results for input(s): LABPROT, INR in the last 72 hours. Hepatitis Panel No results for input(s): HEPBSAG, HCVAB, HEPAIGM, HEPBIGM in the last 72 hours.  Studies/Results: Ct Abdomen Pelvis Wo Contrast  Result Date: 12/27/2017 CLINICAL DATA:  Diffuse abdominal pain for a few months, nausea and vomiting. History of hemorrhoids, urinary tract infection. EXAM: CT ABDOMEN AND PELVIS WITHOUT CONTRAST TECHNIQUE: Multidetector CT imaging of the  abdomen and pelvis was performed following the standard protocol without IV contrast. COMPARISON:  CT chest December 22, 2017. Renal ultrasound November 18, 2017. CT lumbar spine April 28, 2006. FINDINGS: LOWER CHEST: Bilateral lower lobe bronchiectasis with confluent peripheral ground-glass opacities, subpleural sparing. Included heart size is normal. No pericardial effusion. Gas distended esophagus associated with reflux. HEPATOBILIARY: Layering density within gallbladder, likely vicarious excretion of contrast. No CT findings of acute cholecystitis. Normal gallbladder. PANCREAS: Normal. SPLEEN: Normal. ADRENALS/URINARY TRACT: Kidneys are orthotopic, demonstrating normal size and morphology. Bilateral cortical scarring. 7 mm crescentic calcification along LEFT vascular pedicle could reflect aneurysm. 12 mm cyst lower pole RIGHT kidney. No nephrolithiasis, hydronephrosis; limited assessment for renal masses by nonenhanced CT. The unopacified ureters are normal in course and caliber. Urinary bladder is decompressed and unremarkable. Normal adrenal glands. STOMACH/BOWEL: Very small hiatal hernia. The stomach, small and large bowel are normal in course and caliber without inflammatory change. Mild colonic diverticulosis. Normal appendix. VASCULAR/LYMPHATIC: Aortoiliac vessels are normal in course and caliber. Mild calcific atherosclerosis. No lymphadenopathy by CT size criteria. REPRODUCTIVE: Normal. OTHER: No intraperitoneal free fluid or free air. MUSCULOSKELETAL: Non-acute. Severe pubic symphyseal osteoarthrosis. Severe lower lumbar facet arthropathy. Moderate hip osteoarthrosis. IMPRESSION: 1. Colonic diverticulosis without acute diverticulitis nor acute intra-abdominal/pelvic process. 2. Bibasilar ground-glass opacities with subpleural sparing  associated with nonspecific interstitial pneumonia. Bilateral lower lobe bronchiectasis. Aortic Atherosclerosis (ICD10-I70.0). Electronically Signed   By: Elon Alas  M.D.   On: 12/27/2017 18:00   Scheduled Meds: . aspirin EC  81 mg Oral Daily  . fluticasone furoate-vilanterol  1 puff Inhalation Daily  . heparin  5,000 Units Subcutaneous Q8H  . latanoprost  1 drop Both Eyes QHS  . levothyroxine  75 mcg Oral QAC breakfast  . metoprolol tartrate  37.5 mg Oral BID  . pantoprazole  40 mg Oral QAC breakfast  . sodium chloride flush  3 mL Intravenous Q12H   Continuous Infusions: PRN Meds:.albuterol, famotidine, ondansetron  ASSESMENT:   *  Dysphagia, ab pain.  dyssmotility on esophagram EGD 8/19: Tortuous esophagus. Biopsied to rule out EoE. Medium-sized hiatal hernia. Erythematous mucosa in the greater curvature of the gastric antrum.  Biopsied to rule out gastritis & HPylori.  *   Microcytic anemia.  Iron Ferring, sats ok, TIBC low.    *   Hyperplastic colon polyp on colonoscopy in 2013.   Was due rescreening in 2023.                                PLAN   *   Daily PPI.  Ok to discharge home from [facility].  Avoid NSAIDs.     *  fup with GI prn, Dr Rush Landmark.       [name]  12/29/2017, 12:13 PM Phone [phone]

## 2017-12-29 NOTE — NC FL2 (Signed)
[place] LEVEL OF CARE SCREENING TOOL     IDENTIFICATION  Patient Name: [name] Birthdate: Feb 18, 1951 Sex: female Admission Date (Current Location): 12/27/2017  [place] and [place] Number:  [name] [id number] [place] and Address:  [facility] [place], [place] [address], [place], [place] [address]      Provider Number: [id number]  Attending Physician Name and Address:  Mendel Corning, MD  Relative Name and Phone Number:  Alaney Witter - son; [phone]    Current Level of Care: Hospital Recommended Level of Care: [place] Prior Approval Number:    Date Approved/Denied:   PASRR Number: (PASRR # pending)  Discharge Plan: SNF    Current Diagnoses: Patient Active Problem List   Diagnosis Date Noted  . Postprandial nausea 12/28/2017  . AKI (acute kidney injury) ([place]) 12/27/2017  . Anxiety and depression   . Acute CHF (congestive heart failure) ([place]) 12/22/2017  . Hypothyroidism 12/22/2017  . ILD (interstitial lung disease) ([place]) 11/25/2017  . Acute hypoxemic respiratory failure ([place]) 11/18/2017  . Tachycardia 11/17/2017  . ARF (acute renal failure) ([place]) 11/17/2017  . Elevated troponin 11/17/2017  . Chronic respiratory failure with hypoxia ([place]) 11/12/2017  . Shortness of breath   . Pericardial effusion   . Pain 10/19/2017  . Paresthesia 10/19/2017  . Weakness 10/19/2017  . Neck pain 10/19/2017  . PCP NOTES >>> 02/23/2015  . Nocturnal oxygen desaturation 01/02/2015  . Morbid obesity ([place]) 01/02/2015  . Asthma, chronic 01/02/2015  . GERD (gastroesophageal reflux disease) 07/25/2013  . DOE (dyspnea on exertion) 04/25/2013  . 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 Bronchospasm 05/28/2012  . Internal hemorrhoids without mention of complication 12/23/4816  . Annual physical exam 06/06/2011  . Anemia 12/10/2009  . SKIN LESION 08/06/2009  . Hyperlipidemia 01/11/2009  .  Depression 09/26/2008  . INSOMNIA 09/26/2008  . Obstructive sleep apnea 06/23/2008  . OBSTRUCTIVE SLEEP APNEA 06/23/2008  . Chest pain 11/18/2007  . DM II (diabetes mellitus, type II), controlled ([place]) 10/06/2006  . Essential hypertension 10/06/2006  . Osteoarthritis 10/06/2006    Orientation RESPIRATION BLADDER Height & Weight     Self, Time, Situation, Place  O2(2 Liters Oxygen.) Continent, External catheter(Catheter placed 8/18) Weight: 226 lb 13.7 oz (102.9 kg) Height:  5\' 9"  (175.3 cm)  BEHAVIORAL SYMPTOMS/MOOD NEUROLOGICAL BOWEL NUTRITION STATUS      Continent Diet(Heart healthy-Low sodium)  AMBULATORY STATUS COMMUNICATION OF NEEDS Skin   Limited Assist Verbally Normal                       Personal Care Assistance Level of Assistance  Bathing, Feeding, Dressing Bathing Assistance: Limited assistance Feeding assistance: Independent Dressing Assistance: Limited assistance     Functional Limitations Info  Sight, Hearing, Speech Sight Info: Impaired(Wears reading glasses) Hearing Info: Adequate      SPECIAL CARE FACTORS FREQUENCY  PT (By licensed PT)     PT Frequency: Evaluated 8/20              Contractures Contractures Info: Not present    Additional Factors Info  Code Status, Allergies Code Status Info: Full Allergies Info: No known allergies           Current Medications (12/29/2017):  This is the current hospital active medication list Current Facility-Administered Medications  Medication Dose Route Frequency Provider Last Rate Last Dose  . albuterol (PROVENTIL) (2.5 MG/3ML) 0.083% nebulizer solution 2.5 mg  2.5 mg Inhalation  Q4H PRN Vilma Prader, MD      . aspirin EC tablet 81 mg  81 mg Oral Daily Vilma Prader, MD   81 mg at 12/29/17 0920  . famotidine (PEPCID) tablet 20 mg  20 mg Oral Daily PRN Gardiner Barefoot, NP   20 mg at 12/29/17 0222  . fluticasone furoate-vilanterol (BREO ELLIPTA) 200-25 MCG/INH 1 puff  1 puff  Inhalation Daily Vilma Prader, MD   1 puff at 12/29/17 0830  . heparin injection 5,000 Units  5,000 Units Subcutaneous Q8H Vilma Prader, MD   5,000 Units at 12/29/17 1317  . latanoprost (XALATAN) 0.005 % ophthalmic solution 1 drop  1 drop Both Eyes QHS Vilma Prader, MD   1 drop at 12/28/17 2104  . levothyroxine (SYNTHROID, LEVOTHROID) tablet 75 mcg  75 mcg Oral QAC breakfast Vilma Prader, MD   75 mcg at 12/29/17 0920  . metoprolol tartrate (LOPRESSOR) tablet 37.5 mg  37.5 mg Oral BID Vilma Prader, MD   37.5 mg at 12/29/17 0920  . ondansetron (ZOFRAN-ODT) disintegrating tablet 4 mg  4 mg Oral Q8H PRN Vilma Prader, MD      . pantoprazole (PROTONIX) EC tablet 40 mg  40 mg Oral QAC breakfast Vilma Prader, MD   40 mg at 12/29/17 0920  . sodium chloride flush (NS) 0.9 % injection 3 mL  3 mL Intravenous Q12H Vilma Prader, MD   3 mL at 12/28/17 1036     Discharge Medications: Please see discharge summary for a list of discharge medications.  Relevant Imaging Results:  Relevant Lab Results:   Additional Information [id number].  Sable Feil, LCSW

## 2017-12-29 NOTE — Progress Notes (Signed)
Talked with Lorriane Shire, SW and she stated that patient would not be leaving tonight D/T no bed offers from facilities. Patient updated of this information. Will continue to monitor.

## 2017-12-29 NOTE — Progress Notes (Signed)
Initial Nutrition Assessment  DOCUMENTATION CODES:   Non-severe (moderate) malnutrition in context of chronic illness, Obesity unspecified  INTERVENTION:   - 30 ml Pro-stat BID, each provides 100 kcal and 15 grams of protein  NUTRITION DIAGNOSIS:   Moderate Malnutrition related to chronic illness, nausea, vomiting (CKD, CHF) as evidenced by mild muscle depletion, percent weight loss (25.1% weight loss in 5 months).  GOAL:   Patient will meet greater than or equal to 90% of their needs  MONITOR:   PO intake, Supplement acceptance, Weight trends, I & O's, Labs  REASON FOR ASSESSMENT:   Malnutrition Screening Tool    ASSESSMENT:   67 year old female who was admitted after an EGD. PMH significant for postprandial nausea and emesis for several months, DM, CKD, chronic hypoxia on home oxygen, CHF, and hypothyroidism.  8/30 - EGD showed tortuous esophagus, medium-sized hiatal hernia, biopsies pending  Spoke with pt at bedside. CSW in pt's room at time of visit discussing disposition plans but stated RD could come talk to pt. Pt distracted throughout interview, stating, "they're trying to figure out where I'm going to go."  Pt states that she typically eats 2-3 meals daily. A meal might include chicken and green beans. Pt endorses weight loss, stating that her UBW is 300 lbs. Pt unsure when she last weighed this. Pt states that she thinks she weighs 244 lbs now.  Per weight history in chart, pt has been consistently losing weight since November 2018. Pt has lost 76 lbs over the past 5 months. This is a 25.1% weight loss which is significant for timeframe. RD suspects that some of this weight loss can be related to fluid status due to CHF diagnosis. However, extent of weight loss cannot be explained solely from fluid loss.  Wt Readings from Last 15 Encounters:  12/28/17 102.9 kg  12/23/17 102.9 kg  12/18/17 110.7 kg  12/07/17 110.8 kg  12/04/17 115.2 kg  12/03/17 114.8 kg   12/01/17 114.8 kg  11/25/17 115.1 kg  11/18/17 116.1 kg  11/12/17 118.4 kg  10/24/17 126.7 kg  10/19/17 130.2 kg  09/11/17 130.2 kg  08/07/17 (!) 136.3 kg  08/04/17 (!) 137.4 kg   Pt denies any chewing or swallowing difficulties. Pt states she is not experiencing nausea at this time. Pt reports she did "much better than yesterday" with lunch meal.  Meal Completion: 25-75%  Medications reviewed and include: 75 mcg levothyroxine daily, 40 mg Protonix daily  Labs reviewed: creatinine 8.6 (L), hemoglobin 10.9 (L), HCT 34.3 (L) CBG's: 97, 121, 92, 133 x 24 hours  NUTRITION - FOCUSED PHYSICAL EXAM:    Most Recent Value  Orbital Region  Mild depletion  Upper Arm Region  No depletion  Thoracic and Lumbar Region  No depletion  Buccal Region  No depletion  Temple Region  Mild depletion  Clavicle Bone Region  Mild depletion  Clavicle and Acromion Bone Region  Mild depletion  Scapular Bone Region  Unable to assess  Dorsal Hand  Mild depletion  Patellar Region  Mild depletion  Anterior Thigh Region  Mild depletion  Posterior Calf Region  Mild depletion  Edema (RD Assessment)  None  Hair  Reviewed  Eyes  Reviewed  Mouth  Reviewed  Skin  Reviewed  Nails  Reviewed       Diet Order:   Diet Order            Diet - low sodium heart healthy        Diet heart healthy/carb  modified Room service appropriate? Yes; Fluid consistency: Thin  Diet effective now              EDUCATION NEEDS:   No education needs have been identified at this time  Skin:  Skin Assessment: Reviewed RN Assessment  Last BM:  12/29/17  Height:   Ht Readings from Last 1 Encounters:  12/28/17 5\' 9"  (1.753 m)    Weight:   Wt Readings from Last 1 Encounters:  12/28/17 102.9 kg    Ideal Body Weight:  65.9 kg  BMI:  Body mass index is 33.5 kg/m.  Estimated Nutritional Needs:   Kcal:  1900-2100  Protein:  95-110 grams  Fluid:  1.9-2.1 L    Gaynell Face, MS, RD, LDN Pager:  [phone] Weekend/After Hours: [phone]

## 2017-12-29 NOTE — Anesthesia Postprocedure Evaluation (Signed)
Anesthesia Post Note  Patient: [name]  Procedure(s) Performed: ESOPHAGOGASTRODUODENOSCOPY (EGD) WITH PROPOFOL (N/A ) BIOPSY     Patient location during evaluation: PACU Anesthesia Type: MAC Level of consciousness: awake and alert Pain management: pain level controlled Vital Signs Assessment: post-procedure vital signs reviewed and stable Respiratory status: spontaneous breathing, nonlabored ventilation and respiratory function stable Cardiovascular status: stable and blood pressure returned to baseline Anesthetic complications: no    Last Vitals:  Vitals:   12/28/17 2035 12/29/17 0404  BP: 138/85 96/68  Pulse: 83 83  Resp: 18 16  Temp: 36.7 C (!) 36.4 C  SpO2: 100% 100%    Last Pain:  Vitals:   12/29/17 0404  TempSrc: Oral  PainSc:                  [name]

## 2017-12-29 NOTE — Clinical Social Work Placement (Addendum)
   CLINICAL SOCIAL WORK PLACEMENT  NOTE 12/30/17 - DISCHARGED TO [place]  Date:  12/29/2017  Patient Details  Name: [name] MRN: [id number] Date of Birth: [birth year]  Clinical Social Work is seeking post-discharge placement for this patient at the [place] level of care (*CSW will initial, date and re-position this form in  chart as items are completed):  Yes   Patient/family provided with [place] Work Department's list of facilities offering this level of care within the geographic area requested by the patient (or if unable, by the patient's family).  Yes   Patient/family informed of their freedom to choose among providers that offer the needed level of care, that participate in Medicare, Medicaid or managed care program needed by the patient, have an available bed and are willing to accept the patient.  Yes   Patient/family informed of [facility]'s ownership interest in [facility] and [facility], as well as of the fact that they are under no obligation to receive care at these facilities.  PASRR submitted to EDS on 12/29/17     PASRR number received on 12/29/17([phone] E, eff. 8/20 - 01/28/18)     Existing PASRR number confirmed on       FL2 transmitted to all facilities in geographic area requested by pt/family on 12/29/17     FL2 transmitted to all facilities within larger geographic area on       Patient informed that his/her managed care company has contracts with or will negotiate with certain facilities, including the following:        Yes -  Patient/family informed of bed offers received.  Patient/son chooses bed at  [facility]     Physician recommends and patient chooses bed at      Patient transferred to  [facility] on 12/30/17.  Patient to be transferred to facility by Ambulance     Patient family notified on  12/30/17 of transfer.  Name of family member  notified:   Son Stephania Macfarlane [phone]) called and message left.     PHYSICIAN      Additional Comment:  12/29/17 - Facility initiated insurance authorization Tuesday afternoon.   _______________________________________________ Sable Feil, LCSW 12/29/2017, 5:29 PM

## 2017-12-29 NOTE — Telephone Encounter (Signed)
Multiple orders signed on PCP return to office. Forms faxed to [facility] at [phone]. Forms sent for scanning.

## 2017-12-29 NOTE — Progress Notes (Signed)
Pt has refused cpap at this time.  RT spoke with pt and she explained the machine we have is not like her home machine and she does not want to wear it.  RT took machine out of the room and dc'd order. She was told she could request it if she changed her mind later.  RT will monitor.

## 2017-12-29 NOTE — Care Management Note (Signed)
Case Management Note  Patient Details  Name: [name] MRN: [id number] Date of Birth: 1951/05/10  Subjective/Objective:                    Action/Plan:  Spoke to patient and son Tim via phone.   Patient from home with [facility] home health , and home oxygen through [facility] [employer] , has portable tank at bedside. Son can transport patient to appointments etc.   At first patient not agreeable to SNF, after speaking with son patient agreeable. SW talking patient now. Expected Discharge Date:  12/29/17               Expected Discharge Plan:  Skilled Nursing Facility  In-House Referral:  Clinical Social Work  Discharge planning Services  CM Consult  Post Acute Care Choice:    Choice offered to:  Patient, Spouse  DME Arranged:  N/A DME Agency:  NA  HH Arranged:    [place] Agency:  NA  Status of Service:  In process, will continue to follow  If discussed at Long Length of Stay Meetings, dates discussed:    Additional Comments:  Marilu Favre, RN 12/29/2017, 12:25 PM

## 2017-12-29 NOTE — Evaluation (Signed)
Physical Therapy Evaluation Patient Details Name: [name] MRN: [id number] DOB: 1951-04-03 Today's Date: 12/29/2017   History of Present Illness  Pt is a 67 y.o. F with significant PMH including obesity, chronic hypoxic respiratory failure with 3 L nasal cannula O2 at baseline, asthma, OSA on CPAP at bedtime, concern for preserved EF heart failure, history of pericarditis in June 2019, hypothyroidism, non insulin dependent diabetes, hypertension, chronic kidney disease presenting with diffuse back and abdominal pain in addition to acute on chronic nausea and vomiting.   Clinical Impression  Pt admitted with above diagnosis. Pt has assist for ADL's at baseline with aide who comes every other day and uses walker for ambulation. Upon PT evaluation, patient presenting with decreased functional mobility secondary to diminished endurance and generalized weakness. After walking less than 20 feet, patient began to fatigue, stating, "I need to sit down now," pt returned to room to sit edge of bed. SpO2 96% on 2L O2.  Based on examination, very concerned about patient returning home alone with apparent decreased caregiver support. Discussed ST rehab with patient, but she wants to return home because she fears a neighbor is stealing from her in her absence. Pt will benefit from skilled PT to increase their independence and safety with mobility to allow discharge to the venue listed below.       Follow Up Recommendations SNF (if refuses, needs continued HHPT)    Equipment Recommendations  None recommended by PT (has all available equipment)   Recommendations for Other Services       Precautions / Restrictions Precautions Precautions: Fall Restrictions Weight Bearing Restrictions: No      Mobility  Bed Mobility Overal bed mobility: Needs Assistance Bed Mobility: Supine to Sit     Supine to sit: Min guard     General bed mobility comments: significantly increased time with heavy use  of bed rail. seeking assist but ultimately able to do it without physical assistance with encouragement  Transfers Overall transfer level: Needs assistance Equipment used: Rolling walker (2 wheeled) Transfers: Sit to/from Stand Sit to Stand: Min assist         General transfer comment: requiring min assist from elevated surface with increased time to achieve upright   Ambulation/Gait Ambulation/Gait assistance: Min guard Gait Distance (Feet): 40 Feet Assistive device: Rolling walker (2 wheeled) Gait Pattern/deviations: Step-through pattern;Decreased stride length;Wide base of support;Trunk flexed Gait velocity: decr Gait velocity interpretation: <1.31 ft/sec, indicative of household ambulator General Gait Details: Pt requiring min guard assist, continually repeating throughout walking "I have to sit now," and "I can't stand long," pt encouraged to return to room to sit on bed as there was no chair in hallway  Stairs            Wheelchair Mobility    Modified Rankin (Stroke Patients Only)       Balance Overall balance assessment: Needs assistance Sitting-balance support: No upper extremity supported;Feet supported Sitting balance-Leahy Scale: Good     Standing balance support: Bilateral upper extremity supported Standing balance-Leahy Scale: Poor                               Pertinent Vitals/Pain Pain Assessment: Faces Faces Pain Scale: Hurts a little bit Pain Location: abdomen Pain Descriptors / Indicators: Grimacing Pain Intervention(s): Monitored during session    Home Living Family/patient expects to be discharged to:: Private residence Living Arrangements: Alone Available Help at Discharge: Family;Available PRN/intermittently Type  of Home: Apartment Home Access: Level entry     Home Layout: One level Home Equipment: [place] - 2 wheels;Cane - single point;Wheelchair - manual      Prior Function Level of Independence: Needs assistance    Gait / Transfers Assistance Needed: uses walker  ADL's / Homemaking Assistance Needed: has aide every other day who assists with bathing, cooking. on other days, pt son takes her grocery shopping        Hand Dominance        Extremity/Trunk Assessment   Upper Extremity Assessment Upper Extremity Assessment: Generalized weakness    Lower Extremity Assessment Lower Extremity Assessment: Generalized weakness       Communication   Communication: No difficulties  Cognition Arousal/Alertness: Awake/alert Behavior During Therapy: WFL for tasks assessed/performed Overall Cognitive Status: Within Functional Limits for tasks assessed                                        General Comments      Exercises     Assessment/Plan    PT Assessment Patient needs continued PT services  PT Problem List Decreased strength;Decreased activity tolerance;Decreased balance;Decreased mobility       PT Treatment Interventions DME instruction;Gait training;Functional mobility training;Therapeutic activities;Therapeutic exercise;Balance training;Patient/family education    PT Goals (Current goals can be found in the Care Plan section)  Acute Rehab PT Goals Patient Stated Goal: "go home." PT Goal Formulation: With patient Time For Goal Achievement: 01/12/18 Potential to Achieve Goals: Good    Frequency Min 3X/week   Barriers to discharge Decreased caregiver support      Co-evaluation               AM-PAC PT "6 Clicks" Daily Activity  Outcome Measure Difficulty turning over in bed (including adjusting bedclothes, sheets and blankets)?: A Little Difficulty moving from lying on back to sitting on the side of the bed? : A Lot Difficulty sitting down on and standing up from a chair with arms (e.g., wheelchair, bedside commode, etc,.)?: Unable Help needed moving to and from a bed to chair (including a wheelchair)?: A Little Help needed walking in hospital room?:  A Little Help needed climbing 3-5 steps with a railing? : A Lot 6 Click Score: 14    End of Session Equipment Utilized During Treatment: Gait belt;Oxygen Activity Tolerance: Patient limited by fatigue Patient left: in chair;with call bell/phone within reach;with chair alarm set Nurse Communication: Mobility status PT Visit Diagnosis: Unsteadiness on feet (R26.81);Muscle weakness (generalized) (M62.81);Difficulty in walking, not elsewhere classified (R26.2)    Time: 3570-1779 PT Time Calculation (min) (ACUTE ONLY): 34 min   Charges:   PT Evaluation $PT Eval Moderate Complexity: 1 Mod PT Treatments $Therapeutic Activity: 8-22 mins       Ellamae Sia, PT, DPT Acute Rehabilitation Services  Pager: [phone]  Willy Eddy 12/29/2017, 10:24 AM

## 2017-12-30 ENCOUNTER — Telehealth: Payer: Self-pay

## 2017-12-30 ENCOUNTER — Ambulatory Visit: Payer: Medicare Other | Admitting: Pulmonary Disease

## 2017-12-30 DIAGNOSIS — K449 Diaphragmatic hernia without obstruction or gangrene: Secondary | ICD-10-CM | POA: Diagnosis not present

## 2017-12-30 DIAGNOSIS — K224 Dyskinesia of esophagus: Secondary | ICD-10-CM | POA: Diagnosis not present

## 2017-12-30 DIAGNOSIS — N183 Chronic kidney disease, stage 3 (moderate): Secondary | ICD-10-CM | POA: Diagnosis not present

## 2017-12-30 DIAGNOSIS — I130 Hypertensive heart and chronic kidney disease with heart failure and stage 1 through stage 4 chronic kidney disease, or unspecified chronic kidney disease: Secondary | ICD-10-CM | POA: Diagnosis not present

## 2017-12-30 DIAGNOSIS — J9611 Chronic respiratory failure with hypoxia: Secondary | ICD-10-CM | POA: Diagnosis not present

## 2017-12-30 DIAGNOSIS — I5032 Chronic diastolic (congestive) heart failure: Secondary | ICD-10-CM | POA: Diagnosis not present

## 2017-12-30 DIAGNOSIS — E1122 Type 2 diabetes mellitus with diabetic chronic kidney disease: Secondary | ICD-10-CM | POA: Diagnosis not present

## 2017-12-30 DIAGNOSIS — E785 Hyperlipidemia, unspecified: Secondary | ICD-10-CM | POA: Diagnosis not present

## 2017-12-30 DIAGNOSIS — D509 Iron deficiency anemia, unspecified: Secondary | ICD-10-CM | POA: Diagnosis not present

## 2017-12-30 DIAGNOSIS — G4733 Obstructive sleep apnea (adult) (pediatric): Secondary | ICD-10-CM | POA: Diagnosis not present

## 2017-12-30 DIAGNOSIS — E039 Hypothyroidism, unspecified: Secondary | ICD-10-CM | POA: Diagnosis not present

## 2017-12-30 DIAGNOSIS — N179 Acute kidney failure, unspecified: Secondary | ICD-10-CM | POA: Diagnosis not present

## 2017-12-30 DIAGNOSIS — E11649 Type 2 diabetes mellitus with hypoglycemia without coma: Secondary | ICD-10-CM | POA: Diagnosis not present

## 2017-12-30 DIAGNOSIS — R160 Hepatomegaly, not elsewhere classified: Secondary | ICD-10-CM | POA: Diagnosis not present

## 2017-12-30 DIAGNOSIS — G4700 Insomnia, unspecified: Secondary | ICD-10-CM | POA: Diagnosis not present

## 2017-12-30 DIAGNOSIS — R110 Nausea: Secondary | ICD-10-CM

## 2017-12-30 DIAGNOSIS — J45909 Unspecified asthma, uncomplicated: Secondary | ICD-10-CM | POA: Diagnosis not present

## 2017-12-30 DIAGNOSIS — K228 Other specified diseases of esophagus: Secondary | ICD-10-CM | POA: Diagnosis not present

## 2017-12-30 DIAGNOSIS — K219 Gastro-esophageal reflux disease without esophagitis: Secondary | ICD-10-CM | POA: Diagnosis not present

## 2017-12-30 DIAGNOSIS — K209 Esophagitis, unspecified: Secondary | ICD-10-CM | POA: Diagnosis not present

## 2017-12-30 DIAGNOSIS — B9681 Helicobacter pylori [H. pylori] as the cause of diseases classified elsewhere: Secondary | ICD-10-CM | POA: Diagnosis not present

## 2017-12-30 DIAGNOSIS — E1143 Type 2 diabetes mellitus with diabetic autonomic (poly)neuropathy: Secondary | ICD-10-CM | POA: Diagnosis not present

## 2017-12-30 LAB — BASIC METABOLIC PANEL
ANION GAP: 7 (ref 5–15)
BUN: 15 mg/dL (ref 8–23)
CALCIUM: 8.6 mg/dL — AB (ref 8.9–10.3)
CO2: 26 mmol/L (ref 22–32)
Chloride: 102 mmol/L (ref 98–111)
Creatinine, Ser: 1.69 mg/dL — ABNORMAL HIGH (ref 0.44–1.00)
GFR calc Af Amer: 35 mL/min — ABNORMAL LOW (ref >60)
GFR calc non Af Amer: 30 mL/min — ABNORMAL LOW (ref >60)
GLUCOSE: 85 mg/dL (ref 70–99)
Potassium: 3.6 mmol/L (ref 3.5–5.1)
Sodium: 135 mmol/L (ref 135–145)

## 2017-12-30 LAB — GLUCOSE, CAPILLARY
GLUCOSE-CAPILLARY: 87 mg/dL (ref 70–99)
GLUCOSE-CAPILLARY: 90 mg/dL (ref 70–99)
Glucose-Capillary: 81 mg/dL (ref 70–99)

## 2017-12-30 LAB — CBC
HEMATOCRIT: 36.3 % (ref 36.0–46.0)
HEMOGLOBIN: 11.2 g/dL — AB (ref 12.0–15.0)
MCH: 23.8 pg — AB (ref 26.0–34.0)
MCHC: 30.9 g/dL (ref 30.0–36.0)
MCV: 77.1 fL — ABNORMAL LOW (ref 78.0–100.0)
Platelets: 214 10*3/uL (ref 150–400)
RBC: 4.71 MIL/uL (ref 3.87–5.11)
RDW: 22.2 % — ABNORMAL HIGH (ref 11.5–15.5)
WBC: 2.6 10*3/uL — ABNORMAL LOW (ref 4.0–10.5)

## 2017-12-30 MED ORDER — PANTOPRAZOLE SODIUM 40 MG PO TBEC
40.0000 mg | DELAYED_RELEASE_TABLET | Freq: Two times a day (BID) | ORAL | Status: DC
  Administered 2017-12-30: 40 mg via ORAL
  Filled 2017-12-30: qty 1

## 2017-12-30 MED ORDER — ACETAMINOPHEN 325 MG PO TABS
650.0000 mg | ORAL_TABLET | ORAL | Status: DC | PRN
  Administered 2017-12-30: 650 mg via ORAL
  Filled 2017-12-30 (×2): qty 2

## 2017-12-30 MED ORDER — AMOXICILLIN 500 MG PO CAPS: 1000.0000 mg | ORAL_CAPSULE | Freq: Two times a day (BID) | ORAL | 0 refills | Status: AC

## 2017-12-30 MED ORDER — AMOXICILLIN 500 MG PO CAPS
1000.0000 mg | ORAL_CAPSULE | Freq: Two times a day (BID) | ORAL | Status: DC
  Administered 2017-12-30 (×2): 1000 mg via ORAL
  Filled 2017-12-30 (×2): qty 2

## 2017-12-30 MED ORDER — CLARITHROMYCIN 250 MG/5ML PO SUSR: 500.0000 mg | Freq: Two times a day (BID) | ORAL | 0 refills | Status: AC

## 2017-12-30 MED ORDER — PANTOPRAZOLE SODIUM 40 MG PO TBEC: 40.0000 mg | DELAYED_RELEASE_TABLET | Freq: Two times a day (BID) | ORAL | 0 refills | Status: DC

## 2017-12-30 MED ORDER — CLARITHROMYCIN 250 MG/5ML PO SUSR
500.0000 mg | Freq: Two times a day (BID) | ORAL | Status: DC
  Administered 2017-12-30 (×2): 500 mg via ORAL
  Filled 2017-12-30 (×2): qty 10

## 2017-12-30 NOTE — Telephone Encounter (Signed)
10/11 at 1:30 pm follow up with Dr Rush Landmark pt to be notified at discharge

## 2017-12-30 NOTE — Discharge Summary (Addendum)
Physician Discharge Summary   Patient ID: [name] MRN: [id number] DOB/AGE: Jun 02, 1950 67 y.o.  Admit date: 12/27/2017 Discharge date: 12/30/2017  Primary Care Physician:  Colon Branch, MD   Recommendations for Outpatient Follow-up:  Follow up with PCP in 1-2 weeks Follow-up with gastroenterology in 2 weeks, started Amoxicillin and Clarithromycin for H pylori  Home Health: Patient recommended skilled nursing facility Equipment/Devices:   Discharge Condition: stable CODE STATUS: FULL  Diet recommendation: Patient recommended small frequent meals, sit upright, small bites, chew well   Discharge Diagnoses:    Acute on chronic postprandial nausea and vomiting . AKI (acute kidney injury) ([place]) . History of recent pericarditis . Essential hypertension . GERD (gastroesophageal reflux disease) . Morbid obesity ([place]) . Chronic respiratory failure with hypoxia, O2 dependent Obstructive sleep apnea Positive H Pylori   Consults: Gastroenterology    Allergies:  No Known Allergies   DISCHARGE MEDICATIONS: Allergies as of 12/30/2017   No Known Allergies     Medication List    STOP taking these medications   colchicine 0.6 MG tablet   furosemide 20 MG tablet Commonly known as:  LASIX   potassium chloride 10 MEQ tablet Commonly known as:  K-DUR,KLOR-CON   sitaGLIPtin 50 MG tablet Commonly known as:  JANUVIA     TAKE these medications   ACCU-CHEK AVIVA PLUS test strip Generic drug:  glucose blood TEST BLOD SUGAR TWICE DAILY AND LANCETS TWICE DAILY   ACCU-CHEK SOFTCLIX LANCETS lancets USE TO CHECK BLOOD SUGAR TWICE A DAY   acetaminophen 325 MG tablet Commonly known as:  TYLENOL Take 2 tablets (650 mg total) by mouth every 6 (six) hours as needed for mild pain (or Fever >/= 101). What changed:  how much to take   albuterol 108 (90 Base) MCG/ACT inhaler Commonly known as:  PROVENTIL HFA;VENTOLIN HFA Inhale 2 puffs into the lungs every 4 (four) hours as  needed for wheezing.   amoxicillin 500 MG capsule Commonly known as:  AMOXIL Take 2 capsules (1,000 mg total) by mouth every 12 (twelve) hours for 13 days.   aspirin EC 81 MG tablet Take 1 tablet (81 mg total) by mouth daily. With food   budesonide-formoterol 160-4.5 MCG/ACT inhaler Commonly known as:  SYMBICORT Inhale 2 puffs into the lungs 2 (two) times daily.   clarithromycin 250 MG/5ML suspension Commonly known as:  BIAXIN Take 10 mLs (500 mg total) by mouth every 12 (twelve) hours for 13 days.   escitalopram 10 MG tablet Commonly known as:  LEXAPRO Take 1 tablet (10 mg total) by mouth daily.   famotidine 20 MG tablet Commonly known as:  PEPCID Take 1 tablet (20 mg total) by mouth daily as needed for heartburn or indigestion.   latanoprost 0.005 % ophthalmic solution Commonly known as:  XALATAN USE 1 DROP IN BOTH EYES AT BEDTIME What changed:    how much to take  how to take this  when to take this   levothyroxine 75 MCG tablet Commonly known as:  SYNTHROID, LEVOTHROID Take 1 tablet (75 mcg total) by mouth daily before breakfast.   Melatonin 3 MG Tabs Take 1-2 tablets (3-6 mg total) by mouth at bedtime as needed (insomnia).   metoprolol tartrate 25 MG tablet Commonly known as:  LOPRESSOR Take 1.5 tablets (37.5 mg total) by mouth 2 (two) times daily.   ondansetron 4 MG disintegrating tablet Commonly known as:  ZOFRAN-ODT Take 1 tablet (4 mg total) by mouth every 8 (eight) hours as needed for nausea  or vomiting.      OXYGEN Inhale 2 L into the lungs continuous.   pantoprazole 40 MG tablet Commonly known as:  PROTONIX Take 1 tablet (40 mg total) by mouth 2 (two) times daily. What changed:  when to take this   rosuvastatin 40 MG tablet Commonly known as:  CRESTOR Take 1 tablet (40 mg total) by mouth daily. What changed:  when to take this        Brief H and P: For complete details please refer to admission H and P, but in briefPatient is a  67 year old female with obesity, chronic hypoxic respiratory failure with 3 L nasal cannula O2 at baseline, asthma, OSA on CPAP at bedtime, diastolic CHF, history of pericarditis in June/19, hypothyroidism, non-insulin-dependent diabetes, hypertension, CKD, baseline creatinine 1.5 presented with chronic nausea and vomiting, abdominal pain.  Patient reports symptoms for last 3 to 4 months, prior to admission in July, barium swallow was performed which was suggestive of esophageal motility disorder.  Patient also reports about 50 pound weight loss in the last few months due to not able to eat, states that she has not been able to tolerate food, drink other than water, most of the time she has nausea and vomiting after trying to eat.  Patient was admitted for further work-up.   Hospital Course:   Acute on chronic postprandial nausea, with vomiting, weight loss -Prior barium swallow suggestive of esophageal motility disorder -Continue PPI -Random cortisol level normal.  CT abdomen pelvis with no intra-abdominal pathology -GI was consulted.  Patient underwent EGD which showed tortuous esophagus, no other gross lesions, medium sized hiatal hernia with erythematous mucosa in the greater curvature of the gastric antrum.  Duodenum normal. -Currently tolerating solid diet, patient recommended small frequent meals, sit upright and chew well.  Outpatient follow-up with GI for biopsy results, she ended up being positive for H pylori and hence started on Amoxicillin and clarithromycin for 2weeks and PPI BID. -In addition, she was started on Colchicine 2-95months ago, which I suspect was significantly contributing to her GI symptoms and hence this was stopped    DM II (diabetes mellitus, type II), controlled ([name]) -Patient had hypoglycemia while inpatient, hemoglobin A1c 5.6 on 6/11 -Reports loss of 50 pounds over the last 3 to 4 months and not been eating well due to #1 -Hold Januvia outpatient and follow-up with  PCP, may not need to be on any oral hypoglycemics    Essential hypertension, chronic diastolic CHF -BNP> 953 however does not appear to be volume overloaded, -Due to dehydration, poor oral intake, creatinine 2.17 at the time of admission, hold Lasix for now, creatinine improving    GERD (gastroesophageal reflux disease) -Continue PPI  History of pericarditis -Most recent TTE without significant effusion, reviewed office cardiology notes, patient to continue colchicine with PPI until mid-September -Will need repeat 2D echo, defer to cardiology recommendations outpatient    Morbid obesity ([place]) -BMI 33.4, follow diet and exercise    AKI (acute kidney injury) ([place]) on CKD stage III -Baseline creatinine 1.4-1.5, admitted with creatinine of 2.17 secondary to dehydration, poor oral intake -Patient was placed on gentle hydration, creatinine now improving 1.6, Lasix on hold  Chronic respiratory failure with hypoxia, O2 dependent, OSA -O2 supplementation  Day of Discharge S: Feeling somewhat better now, able to tolerate breakfast without any difficulty.  BP 122/79 (BP Location: Left Arm)   Pulse 80   Temp 97.6 F (36.4 C) (Oral)   Resp 13   Ht  5\' 9"  (1.753 m)   Wt 102.9 kg   SpO2 100%   BMI 33.50 kg/m   Physical Exam: Gen: Awake, Alert, Oriented X 3,  HEENT: PERRLA, Neck supple, no JVD Lungs: Good air movement bilaterally, CTAB CVS: RRR,No Gallops,Rubs or new Murmurs Abd: soft, Non tender, non distended, BS present Extremities: No Cyanosis, Clubbing or edema Skin: no new rashes   The results of significant diagnostics from this hospitalization (including imaging, microbiology, ancillary and laboratory) are listed below for reference.      Procedures/Studies:  Ct Abdomen Pelvis Wo Contrast  Result Date: 12/27/2017 CLINICAL DATA:  Diffuse abdominal pain for a few months, nausea and vomiting. History of hemorrhoids, urinary tract infection. EXAM: CT ABDOMEN AND PELVIS  WITHOUT CONTRAST TECHNIQUE: Multidetector CT imaging of the abdomen and pelvis was performed following the standard protocol without IV contrast. COMPARISON:  CT chest December 22, 2017. Renal ultrasound November 18, 2017. CT lumbar spine April 28, 2006. FINDINGS: LOWER CHEST: Bilateral lower lobe bronchiectasis with confluent peripheral ground-glass opacities, subpleural sparing. Included heart size is normal. No pericardial effusion. Gas distended esophagus associated with reflux. HEPATOBILIARY: Layering density within gallbladder, likely vicarious excretion of contrast. No CT findings of acute cholecystitis. Normal gallbladder. PANCREAS: Normal. SPLEEN: Normal. ADRENALS/URINARY TRACT: Kidneys are orthotopic, demonstrating normal size and morphology. Bilateral cortical scarring. 7 mm crescentic calcification along LEFT vascular pedicle could reflect aneurysm. 12 mm cyst lower pole RIGHT kidney. No nephrolithiasis, hydronephrosis; limited assessment for renal masses by nonenhanced CT. The unopacified ureters are normal in course and caliber. Urinary bladder is decompressed and unremarkable. Normal adrenal glands. STOMACH/BOWEL: Very small hiatal hernia. The stomach, small and large bowel are normal in course and caliber without inflammatory change. Mild colonic diverticulosis. Normal appendix. VASCULAR/LYMPHATIC: Aortoiliac vessels are normal in course and caliber. Mild calcific atherosclerosis. No lymphadenopathy by CT size criteria. REPRODUCTIVE: Normal. OTHER: No intraperitoneal free fluid or free air. MUSCULOSKELETAL: Non-acute. Severe pubic symphyseal osteoarthrosis. Severe lower lumbar facet arthropathy. Moderate hip osteoarthrosis. IMPRESSION: 1. Colonic diverticulosis without acute diverticulitis nor acute intra-abdominal/pelvic process. 2. Bibasilar ground-glass opacities with subpleural sparing associated with nonspecific interstitial pneumonia. Bilateral lower lobe bronchiectasis. Aortic Atherosclerosis  (ICD10-I70.0). Electronically Signed   By: Elon Alas M.D.   On: 12/27/2017 18:00   Dg Chest 2 View  Result Date: 12/27/2017 CLINICAL DATA:  Generalized chest pain. EXAM: CHEST - 2 VIEW COMPARISON:  Chest CT dated December 22, 2017. Chest x-ray dated December 21, 2017. FINDINGS: The heart size and mediastinal contours are within normal limits. Normal pulmonary vascularity. Atherosclerotic calcification of the aortic arch. Patchy opacities at the lung bases are similar to prior studies. No new focal infiltrate. No pleural effusion or pneumothorax. No acute osseous abnormality. IMPRESSION: 1. Stable bibasilar patchy airspace disease, better evaluated on recent chest CT. Electronically Signed   By: Titus Dubin M.D.   On: 12/27/2017 11:18   Dg Chest 2 View  Result Date: 12/21/2017 CLINICAL DATA:  Chest pain and vomiting EXAM: CHEST - 2 VIEW COMPARISON:  12/07/2017 CT of the chest FINDINGS: Cardiac shadow is mildly enlarged. Aortic calcifications are again seen. Patchy changes in the bases are again seen similar to that noted on prior CT examination. No new focal infiltrate or sizable effusion is seen. No bony abnormality is noted. IMPRESSION: Stable bibasilar changes consistent with the prior CT. Electronically Signed   By: Inez Catalina M.D.   On: 12/21/2017 22:14   Ct Angio Chest Pe W And/or Wo Contrast  Result Date: 12/22/2017  CLINICAL DATA:  Sharp chest pain starting last night. Pain on palpation. Shortness of breath. EXAM: CT ANGIOGRAPHY CHEST WITH CONTRAST TECHNIQUE: Multidetector CT imaging of the chest was performed using the standard protocol during bolus administration of intravenous contrast. Multiplanar CT image reconstructions and MIPs were obtained to evaluate the vascular anatomy. CONTRAST:  170mL ISOVUE-370 IOPAMIDOL (ISOVUE-370) INJECTION 76% COMPARISON:  12/07/2017 FINDINGS: Cardiovascular: There is good opacification of the central and segmental pulmonary arteries. No focal filling  defects. No evidence of significant pulmonary embolus. Normal caliber thoracic aorta with scattered calcification. Normal heart size. No pericardial effusions. Mediastinum/Nodes: No significant lymphadenopathy in the chest. Scattered lymph nodes are not pathologically enlarged. Esophagus is not significantly dilated but there is an air-fluid level with fluid in the esophagus. This may be due to reflux or dysmotility. Distal obstructing lesion is not identified. Similar appearance to previous study. Right adrenal gland nodule again demonstrated. Lungs/Pleura: Diffuse airspace disease throughout the lungs, most prominent in the bases. Airways are patent with mild bronchiectasis. No focal consolidation. No pleural effusions. No pneumothorax. Similar appearance to previous study. Upper Abdomen: No acute changes identified. Musculoskeletal: No chest wall abnormality. No acute or significant osseous findings. Review of the MIP images confirms the above findings. IMPRESSION: 1. No evidence of significant pulmonary embolus. 2. Diffuse airspace disease throughout the lungs, most prominent in the bases. This could represent pneumonia or edema. Similar appearance to previous study. 3. Air-fluid level in the esophagus may be due to reflux or dysmotility. Aortic Atherosclerosis (ICD10-I70.0). Electronically Signed   By: Lucienne Capers M.D.   On: 12/22/2017 04:21   Ct Chest High Resolution  Result Date: 12/07/2017 CLINICAL DATA:  67 year old female with history of shortness of breath which is chronic but progressively worsening. EXAM: CT CHEST WITHOUT CONTRAST TECHNIQUE: Multidetector CT imaging of the chest was performed following the standard protocol without intravenous contrast. High resolution imaging of the lungs, as well as inspiratory and expiratory imaging, was performed. COMPARISON:  Chest CT 10/20/2017. FINDINGS: Cardiovascular: Heart size is normal. There is no significant pericardial fluid, thickening or  pericardial calcification. There is aortic atherosclerosis, as well as atherosclerosis of the great vessels of the mediastinum and the coronary arteries, including calcified atherosclerotic plaque in the left main, left anterior descending and right coronary arteries. Calcifications of the aortic valve. Pulmonic trunk is mildly dilated measuring 3.7 cm in diameter. Mediastinum/Nodes: No pathologically enlarged mediastinal or hilar lymph nodes. Please note that accurate exclusion of hilar adenopathy is limited on noncontrast CT scans. Small hiatal hernia. No axillary lymphadenopathy. Asymmetric enlargement of the right lobe of the thyroid gland with a heterogeneous appearing nodule extending off the inferior aspect of the right lobe of the gland measuring 2.9 x 2.0 cm, similar to the prior study. Lungs/Pleura: High-resolution images demonstrate widespread areas of predominantly ground-glass attenuation which are most severe throughout the mid to lower lungs bilaterally. In the areas of greatest involvement there is also extensive septal thickening, thickening of the peribronchovascular interstitium and cylindrical as well as mild varicose bronchiectasis. No honeycombing is identified. Inspiratory and expiratory imaging is unremarkable. No acute consolidative airspace disease. No pleural effusions. No suspicious appearing pulmonary nodules or masses are noted. Upper Abdomen: Unremarkable. Musculoskeletal: There are no aggressive appearing lytic or blastic lesions noted in the visualized portions of the skeleton. IMPRESSION: 1. 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 Although there is no definite honeycombing at this time, given the strong craniocaudal gradient and lack of air trapping, findings are strongly suspicious  for UIP. Repeat high-resolution chest CT is suggested in 12 months to assess for temporal changes in the appearance of the lung parenchyma. 2. Mild dilatation of the pulmonic trunk (3.7  cm in diameter), which may suggest an associated pulmonary arterial hypertension. 3. Aortic atherosclerosis, in addition to left main and 2 vessel coronary artery disease. Please note that although the presence of coronary artery calcium documents the presence of coronary artery disease, the severity of this disease and any potential stenosis cannot be assessed on this non-gated CT examination. Assessment for potential risk factor modification, dietary therapy or pharmacologic therapy may be warranted, if clinically indicated. 4. There are calcifications of the aortic valve. Echocardiographic correlation for evaluation of potential valvular dysfunction may be warranted if clinically indicated. 5. Nodular enlargement of the inferior aspect of the right lobe of the thyroid gland which measures 2.9 x 2.0 cm. Further evaluation with nonemergent thyroid ultrasound is suggested in the near future to better evaluate this lesion and determine potential need for fine-needle aspiration. Aortic Atherosclerosis (ICD10-I70.0). Electronically Signed   By: Vinnie Langton M.D.   On: 12/07/2017 11:25   US Thyroid  Result Date: 12/11/2017 CLINICAL DATA:  67 year old female with a history of thyroid nodules EXAM: THYROID ULTRASOUND TECHNIQUE: Ultrasound examination of the thyroid gland and adjacent soft tissues was performed. COMPARISON:  None. FINDINGS: Parenchymal Echotexture: Moderately heterogenous Isthmus: 0.9 cm Right lobe: 5.1 cm x 1.9 cm x 2.3 cm Left lobe: 4.4 cm x 1.5 cm x 1.1 cm _________________________________________________________ Estimated total number of nodules >/= 1 cm: 3 Number of spongiform nodules >/=  2 cm not described below (TR1): 0 Number of mixed cystic and solid nodules >/= 1.5 cm not described below (TR2): 0 _________________________________________________________ Nodule # 1: Location: Isthmus; Superior Maximum size: 1.46 cm; Other 2 dimensions: 0.9 cm x 1.2 cm Composition: solid/almost completely  solid (2) Echogenicity: hypoechoic (2) Shape: not taller-than-wide (0) Margins: ill-defined (0) Echogenic foci: none (0) ACR TI-RADS total points: 4. ACR TI-RADS risk category: TR4 (4-6 points). ACR TI-RADS recommendations: Nodule meets criteria for surveillance _________________________________________________________ Nodule # 2: Location: Right; Superior Maximum size: 1.6 cm; Other 2 dimensions: 0.7 cm x 1.5 cm Composition: mixed cystic and solid (1) Echogenicity: hypoechoic (2) Shape: not taller-than-wide (0) Margins: ill-defined (0) Echogenic foci: none (0) ACR TI-RADS total points: 3. ACR TI-RADS risk category: TR3 (3 points). ACR TI-RADS recommendations: Nodule meets criteria for surveillance _________________________________________________________ Nodule # 3: Location: Right; Inferior Maximum size: 2.2 cm; Other 2 dimensions: 2.0 cm x 1.8 cm Composition: solid/almost completely solid (2) Echogenicity: isoechoic (1) Shape: taller-than-wide (3) Margins: ill-defined (0) Echogenic foci: none (0) ACR TI-RADS total points: 6. ACR TI-RADS risk category: TR4 (4-6 points). ACR TI-RADS recommendations: Nodule meets criteria for biopsy _________________________________________________________ No adenopathy IMPRESSION: Right inferior thyroid nodule (labeled 3) meets criteria for biopsy, if indeed not a pseudo nodule, as designated by the newly established ACR TI-RADS criteria, and referral for biopsy is recommended. The isthmic and right superior thyroid nodules (labeled 1 and 2) meet criteria for surveillance, as designated by the newly established ACR TI-RADS criteria. Surveillance ultrasound study recommended to be performed annually up to 5 years. Recommendations follow those established by the new ACR TI-RADS criteria (J Am Coll Radiol 5784;69:629-528). Electronically Signed   By: Corrie Mckusick D.O.   On: 12/11/2017 13:09      LAB RESULTS: Basic Metabolic Panel: Recent Labs  Lab 12/29/17 0648  12/30/17 0410  NA 137 135  K 3.5 3.6  CL  103 102  CO2 25 26  GLUCOSE 125* 85  BUN 15 15  CREATININE 1.63* 1.69*  CALCIUM 8.6* 8.6*   Liver Function Tests: Recent Labs  Lab 12/27/17 1046  AST 95*  ALT 62*  ALKPHOS 64  BILITOT 1.4*  PROT 8.3*  ALBUMIN 3.1*   Recent Labs  Lab 12/27/17 1046  LIPASE 66*   No results for input(s): AMMONIA in the last 168 hours. CBC: Recent Labs  Lab 12/27/17 1046  12/29/17 0648 12/30/17 0410  WBC 4.4   < > 2.8* 2.6*  NEUTROABS 2.8  --   --   --   HGB 11.5*   < > 10.9* 11.2*  HCT 37.8   < > 34.3* 36.3  MCV 77.9*   < > 75.7* 77.1*  PLT 259   < > 229 214   < > = values in this interval not displayed.   Cardiac Enzymes: No results for input(s): CKTOTAL, CKMB, CKMBINDEX, TROPONINI in the last 168 hours. BNP: Invalid input(s): POCBNP CBG: Recent Labs  Lab 12/30/17 0051 12/30/17 0502  GLUCAP 87 90      Disposition and Follow-up: Discharge Instructions    Diet - low sodium heart healthy   Complete by:  As directed    Discharge instructions   Complete by:  As directed    Always sit upright for meals. Small frequent meals, chew well, small bites. Follow up with GI doctor in office in 2 weeks.   Increase activity slowly   Complete by:  As directed        DISPOSITION: Skilled nursing facility  DISCHARGE FOLLOW-UP Follow-up Information    Mansouraty, Telford Nab., MD. Schedule an appointment as soon as possible for a visit in 2 week(s).   Specialties:  Gastroenterology, Internal Medicine Why:  GI for hospital follow-up Contact information: [place] [address] [phone]        Colon Branch, MD. Schedule an appointment as soon as possible for a visit in 2 week(s).   Specialty:  Internal Medicine Contact information: [address] [place] [address] [phone]        Sueanne Margarita, MD .   Specialty:  Cardiology Contact information: [address] [place]  [place] [address] [phone]            Time coordinating discharge:  90mins   Signed:   Domenic Polite M.D. [facility] 12/30/2017, 10:01 AM

## 2017-12-30 NOTE — Telephone Encounter (Signed)
-----   Message from Irving Copas., MD sent at 12/29/2017  9:40 PM EDT ----- Regarding: Patient for follow up in clinic Dear Chong Sicilian, Please schedule this patient for followup in GI clinic with myself or PA in 39-months. Thanks [employer]

## 2017-12-30 NOTE — Care Management Important Message (Signed)
Important Message  Patient Details  Name: [name] MRN: [id number] Date of Birth: November 29, 1950   Medicare Important Message Given:  Yes    Arvil Utz [address] 12/30/2017, 1:35 PM

## 2017-12-31 ENCOUNTER — Other Ambulatory Visit: Payer: Self-pay

## 2017-12-31 DIAGNOSIS — A048 Other specified bacterial intestinal infections: Secondary | ICD-10-CM | POA: Diagnosis not present

## 2017-12-31 DIAGNOSIS — E039 Hypothyroidism, unspecified: Secondary | ICD-10-CM | POA: Diagnosis not present

## 2017-12-31 DIAGNOSIS — I5032 Chronic diastolic (congestive) heart failure: Secondary | ICD-10-CM | POA: Diagnosis not present

## 2017-12-31 DIAGNOSIS — R488 Other symbolic dysfunctions: Secondary | ICD-10-CM | POA: Diagnosis not present

## 2017-12-31 DIAGNOSIS — R1310 Dysphagia, unspecified: Secondary | ICD-10-CM | POA: Diagnosis not present

## 2017-12-31 DIAGNOSIS — R112 Nausea with vomiting, unspecified: Secondary | ICD-10-CM | POA: Diagnosis not present

## 2017-12-31 DIAGNOSIS — Z8619 Personal history of other infectious and parasitic diseases: Secondary | ICD-10-CM | POA: Diagnosis not present

## 2017-12-31 DIAGNOSIS — M6281 Muscle weakness (generalized): Secondary | ICD-10-CM | POA: Diagnosis not present

## 2017-12-31 DIAGNOSIS — R5381 Other malaise: Secondary | ICD-10-CM | POA: Diagnosis not present

## 2017-12-31 DIAGNOSIS — Z743 Need for continuous supervision: Secondary | ICD-10-CM | POA: Diagnosis not present

## 2017-12-31 DIAGNOSIS — Z87898 Personal history of other specified conditions: Secondary | ICD-10-CM | POA: Diagnosis not present

## 2017-12-31 DIAGNOSIS — R531 Weakness: Secondary | ICD-10-CM | POA: Diagnosis not present

## 2017-12-31 DIAGNOSIS — A049 Bacterial intestinal infection, unspecified: Secondary | ICD-10-CM | POA: Diagnosis not present

## 2017-12-31 DIAGNOSIS — I10 Essential (primary) hypertension: Secondary | ICD-10-CM | POA: Diagnosis not present

## 2017-12-31 DIAGNOSIS — I509 Heart failure, unspecified: Secondary | ICD-10-CM | POA: Diagnosis not present

## 2017-12-31 DIAGNOSIS — R2681 Unsteadiness on feet: Secondary | ICD-10-CM | POA: Diagnosis not present

## 2017-12-31 DIAGNOSIS — R278 Other lack of coordination: Secondary | ICD-10-CM | POA: Diagnosis not present

## 2017-12-31 DIAGNOSIS — E782 Mixed hyperlipidemia: Secondary | ICD-10-CM | POA: Diagnosis not present

## 2017-12-31 DIAGNOSIS — R279 Unspecified lack of coordination: Secondary | ICD-10-CM | POA: Diagnosis not present

## 2017-12-31 DIAGNOSIS — R1319 Other dysphagia: Secondary | ICD-10-CM | POA: Diagnosis not present

## 2017-12-31 DIAGNOSIS — I2510 Atherosclerotic heart disease of native coronary artery without angina pectoris: Secondary | ICD-10-CM | POA: Diagnosis not present

## 2017-12-31 DIAGNOSIS — J9611 Chronic respiratory failure with hypoxia: Secondary | ICD-10-CM | POA: Diagnosis not present

## 2017-12-31 DIAGNOSIS — R110 Nausea: Secondary | ICD-10-CM | POA: Diagnosis not present

## 2017-12-31 DIAGNOSIS — G4733 Obstructive sleep apnea (adult) (pediatric): Secondary | ICD-10-CM | POA: Diagnosis not present

## 2017-12-31 NOTE — Consult Note (Signed)
   [facility] CM Inpatient Consult   12/31/2017  [name] [birth year] [id number]   Confirmed that patient went to [facility].  Alerted active [facility] staff members of disposition.  Natividad Brood, RN BSN [facility] Liaison  [phone] business mobile phone Toll free office [phone]

## 2018-01-04 DIAGNOSIS — E039 Hypothyroidism, unspecified: Secondary | ICD-10-CM | POA: Diagnosis not present

## 2018-01-04 DIAGNOSIS — A048 Other specified bacterial intestinal infections: Secondary | ICD-10-CM | POA: Diagnosis not present

## 2018-01-04 DIAGNOSIS — E782 Mixed hyperlipidemia: Secondary | ICD-10-CM | POA: Diagnosis not present

## 2018-01-04 DIAGNOSIS — I10 Essential (primary) hypertension: Secondary | ICD-10-CM | POA: Diagnosis not present

## 2018-01-05 ENCOUNTER — Other Ambulatory Visit: Payer: Self-pay | Admitting: Licensed Clinical Social Worker

## 2018-01-05 ENCOUNTER — Ambulatory Visit: Payer: Self-pay | Admitting: Pharmacist

## 2018-01-05 DIAGNOSIS — I10 Essential (primary) hypertension: Secondary | ICD-10-CM | POA: Diagnosis not present

## 2018-01-05 DIAGNOSIS — J9611 Chronic respiratory failure with hypoxia: Secondary | ICD-10-CM | POA: Diagnosis not present

## 2018-01-05 DIAGNOSIS — A049 Bacterial intestinal infection, unspecified: Secondary | ICD-10-CM | POA: Diagnosis not present

## 2018-01-05 DIAGNOSIS — R5381 Other malaise: Secondary | ICD-10-CM | POA: Diagnosis not present

## 2018-01-05 NOTE — Patient Outreach (Signed)
[facility] ([facility]) Care Management  01/05/2018  [name] 10/07/1950 [id number]  [facility] CSW arrived at [facility] and successfully completed PAC Consult with patient and patient's son [name]. Patient provided HIPPA verifications successfully. [facility] CSW introduced self, reason for visit and of [facility] social work services. Patient was previously active with [facility] Care Management Services before SNF admission. Patient was actively working with [facility] RNCM and Pharmacist prior to SNF admission. Patient reports that she lives alone in her apartment and plans on returning there post SNF discharge. Patient shares that her son [name] lives a few streets over from her and can easily check on her when needed. Patient reports that she arrived at [facility] on 01/03/18. She shares that her main support network consist of her two sons: one that lives near her in [place] and the other in [place]. Patient reports that she has been battling ongoing nausea for months. She reports that she is participating in PT/OT and shares that they are coming to her and doing therapy with her in her room instead of her going to their room. Patient's son [name] reports that he works the night shift but that he checks on patient multiple times throughout the week. He states that he is going to leave in a few hours and his brother will take over the next shift. [facility] CSW provided education on available mental health resources with family. Patient has been provided mental health resources in the past but patient had not pursued them yet. Patient reports that her depression "comes and goes" but that she is managing her symptoms well at this time. [facility] CSW encouraged patient to consider these mental health resources. Patient confirms stable transportation through her insurance and her sons. Patient denies having SCAT or wanting this service. [facility] BSW assisted patient with gaining an aide through [facility]  PCS. Patient's aide will resume services post SNF discharge. [facility] CSW met with SNF social worker Roxanne and informed her that [facility] CSW will be following patient.   Brooke Joyce, BSW, MSW, LCSW [facility] Care Management [email or web address] Phone: [phone] Fax: [phone]   

## 2018-01-05 NOTE — Patient Outreach (Signed)
[place] [facility]) Care Management  01/05/2018  [name] 25-Jun-1950 [id number]  [facility] CSW completed call to [facility] and was informed that patient is still at facility. [facility] CSW will schedule initial PAC Consult for today and will complete visit.  Eula Fried, BSW, MSW, [email or web address] Phone: [phone] Fax: [phone]

## 2018-01-06 ENCOUNTER — Inpatient Hospital Stay: Payer: Medicare Other | Admitting: Internal Medicine

## 2018-01-07 ENCOUNTER — Encounter: Payer: Self-pay | Admitting: Gastroenterology

## 2018-01-07 ENCOUNTER — Ambulatory Visit: Payer: Medicare Other | Admitting: Internal Medicine

## 2018-01-08 ENCOUNTER — Other Ambulatory Visit: Payer: Self-pay

## 2018-01-08 NOTE — Patient Outreach (Signed)
[facility]) Care Management  01/08/2018  [name] 1950/06/25 [id number]   67 year old with hsitory ofPericarditis,OSA, asthma, chornic hypoxia respiratory failure, HTN, DM.  8/18-8/21 postprandial nausea/vomiting-hospitalized and discharged to Skilled facility. 8/12-8/14 with chest pain.  7/9-7/12/19 for tachycardia 6/10-6/15 with acute respiratory failure with hypoxia.  Client has been in hospital/facility 10 days or greater.   Plan: RNCM will close out of case and update remaining disciplines.    Thea Silversmith, RN, MSN, [place] Coordinator Cell: [phone]

## 2018-01-12 ENCOUNTER — Other Ambulatory Visit: Payer: Self-pay | Admitting: Pharmacist

## 2018-01-12 ENCOUNTER — Ambulatory Visit: Payer: Self-pay | Admitting: Pharmacist

## 2018-01-12 NOTE — Patient Outreach (Signed)
[facility] [facility]) Care Management  01/12/2018  [name] December 09, 1950 [id number]   Patient is currently in a SNF. Pharmacy case will be closed.   Plan: Alert social work of pharmacy case closure as [facility] Social Worker, Eula Fried is still following the patient.   [name], PharmD, [place] Clinical Pharmacist [phone]

## 2018-01-13 ENCOUNTER — Ambulatory Visit: Payer: Medicare Other | Admitting: Internal Medicine

## 2018-01-13 DIAGNOSIS — A048 Other specified bacterial intestinal infections: Secondary | ICD-10-CM | POA: Diagnosis not present

## 2018-01-13 DIAGNOSIS — I10 Essential (primary) hypertension: Secondary | ICD-10-CM | POA: Diagnosis not present

## 2018-01-13 DIAGNOSIS — E782 Mixed hyperlipidemia: Secondary | ICD-10-CM | POA: Diagnosis not present

## 2018-01-13 DIAGNOSIS — E039 Hypothyroidism, unspecified: Secondary | ICD-10-CM | POA: Diagnosis not present

## 2018-01-14 ENCOUNTER — Other Ambulatory Visit: Payer: Self-pay | Admitting: Licensed Clinical Social Worker

## 2018-01-14 NOTE — Patient Outreach (Signed)
[place] [facility]) Care Management  01/14/2018  DEBORHA MOSELEY 12-08-1950 [id number]  [facility] CSW arrived at [facility] and completed [facility] Consult and coordination. Patient reports to be doing well and feeling as if she is making progress in therapy. Patient reports that her son's continue to check on her daily. [facility] CSW spoke with PT Manager and was informed that the plan is for patient to return home post SNF discharge but they have some concerns about her living alone. Patient has been having difficulty with getting up out of bed/wheelchair and transferring. [facility] CSW met with SNF social worker and was informed that she has started the [facility] application for patient so that she can have an aide once she returns home post SNF discharge.  Eula Fried, BSW, MSW, [email or web address] Phone: [phone] Fax: [phone]

## 2018-01-18 ENCOUNTER — Other Ambulatory Visit: Payer: Self-pay | Admitting: Licensed Clinical Social Worker

## 2018-01-18 DIAGNOSIS — E782 Mixed hyperlipidemia: Secondary | ICD-10-CM | POA: Diagnosis not present

## 2018-01-18 DIAGNOSIS — I10 Essential (primary) hypertension: Secondary | ICD-10-CM | POA: Diagnosis not present

## 2018-01-18 DIAGNOSIS — E039 Hypothyroidism, unspecified: Secondary | ICD-10-CM | POA: Diagnosis not present

## 2018-01-18 DIAGNOSIS — A048 Other specified bacterial intestinal infections: Secondary | ICD-10-CM | POA: Diagnosis not present

## 2018-01-18 NOTE — Patient Outreach (Signed)
[place] [facility]) Care Management  01/18/2018  NATSHA GUIDRY July 05, 1950 [id number]  [facility] CSW arrived at [facility] and successfully completed [facility] Consult with patient. Patient reports that she is feeling a lot better and is already very ready to return back home. Patient shares that she is implementing healthy coping tools into her routine and managing her depressive symptoms effectively by doing puzzles and socialization activities at [facility]. Patient was provided positive reinforcement for this implementation. Patient shares that she feels as if she is a lot stronger and will be able to have a safe discharge back home but is aware that there is no discharge date set yet. Patient reports that her appetite is also "better than it was."   Eula Fried, BSW, MSW, [email or web address] Phone: [phone] Fax: [phone]

## 2018-01-25 ENCOUNTER — Other Ambulatory Visit: Payer: Self-pay | Admitting: Licensed Clinical Social Worker

## 2018-01-25 DIAGNOSIS — J9611 Chronic respiratory failure with hypoxia: Secondary | ICD-10-CM | POA: Diagnosis not present

## 2018-01-25 DIAGNOSIS — E782 Mixed hyperlipidemia: Secondary | ICD-10-CM | POA: Diagnosis not present

## 2018-01-25 DIAGNOSIS — E039 Hypothyroidism, unspecified: Secondary | ICD-10-CM | POA: Diagnosis not present

## 2018-01-25 DIAGNOSIS — I10 Essential (primary) hypertension: Secondary | ICD-10-CM | POA: Diagnosis not present

## 2018-01-25 NOTE — Patient Outreach (Signed)
[facility] [facility]) Care Management  01/25/2018  PAIZLEIGH WILDS 06-10-1950 [id number]  [facility] CSW completed call to [facility] and was transferred to SNF social worker Roxanne but was unable to reach her successfully. HIPPA compliant voice message left encouraging a return call with discharge updates on patient.  [facility] CSW will complete PAC Consult within one week.  Eula Fried, BSW, MSW, [email or web address] Phone: [phone] Fax: [phone]

## 2018-01-26 ENCOUNTER — Other Ambulatory Visit: Payer: Self-pay | Admitting: Licensed Clinical Social Worker

## 2018-01-26 NOTE — Patient Outreach (Signed)
[facility]) Care Management  01/26/2018  [name] 1950/06/30 [id number]  [facility] CSW arrived at [facility] but was unable to locate patient in the facility. [facility] CSW was unable to find patient in the PT or activity room. [facility] CSW was able to meet with SNF social worker Lupita Dawn and was informed that patient has an upcoming care team planning meeting this week and should be scheduled to discharge back home at some point next week.  [facility] CSW will re-attempt PAC Consult within one week.   Eula Fried, BSW, MSW, [email or web address] Phone: [phone] Fax: [phone]

## 2018-01-29 ENCOUNTER — Telehealth: Payer: Self-pay | Admitting: *Deleted

## 2018-01-29 ENCOUNTER — Encounter: Payer: Self-pay | Admitting: Gastroenterology

## 2018-01-29 ENCOUNTER — Ambulatory Visit (INDEPENDENT_AMBULATORY_CARE_PROVIDER_SITE_OTHER): Payer: Medicare Other | Admitting: Gastroenterology

## 2018-01-29 ENCOUNTER — Other Ambulatory Visit: Payer: Medicare Other

## 2018-01-29 VITALS — BP 142/88 | HR 80 | Ht 69.0 in | Wt 224.0 lb

## 2018-01-29 DIAGNOSIS — Z8619 Personal history of other infectious and parasitic diseases: Secondary | ICD-10-CM

## 2018-01-29 DIAGNOSIS — R1310 Dysphagia, unspecified: Secondary | ICD-10-CM

## 2018-01-29 DIAGNOSIS — Z87898 Personal history of other specified conditions: Secondary | ICD-10-CM

## 2018-01-29 NOTE — Progress Notes (Signed)
GASTROENTEROLOGY OUTPATIENT CLINIC VISIT   Primary Care Provider Colon Branch, [place] [address] [place] [address] [phone]  Referring Provider Colon Branch, MD [address] [place], [place] [address] [phone]  Patient Profile: [name] is a 67 y.o. 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  The patient presents to the [facility] for an evaluation and management of problem(s) noted below:  Problem List 1. Dysphagia, unspecified type   2. History of Helicobacter pylori infection   3. History of abdominal pain     History of Present Illness: This patient returns for follow-up after recent hospitalization.  She remains in the skilled nursing facility and is hoping to be able to be discharged soon.  Regards to her abdominal discomfort and abdominal pain as well as nausea and vomiting that she previously had prior to her hospitalization and during her hospitalization that has significantly improved.  She is describing some very minimal symptoms of dysphasia that occur.  As noted in her endoscopy report there is no overt evidence of your be and she had a tortuous esophagus.  Liquids are passing without issues.  She describes no significant solid food dysphasia.  Pyrosis seems to be better controlled.  She really wants to get home as soon as possible.  Weight seems to be stable per her report.  GI Review of Systems Positive as above Negative for odynophagia, jaundice, change in bowel habits, melena, hematochezia, bloating, nausea, vomiting  Review of Systems General: Denies fevers/chills HEENT: Denies oral lesions Cardiovascular: Denies chest pain Pulmonary: Positive for shortness of breath (even at the beginning of her clinic visit we placed a pulse ox that showed O2 sats between 98 to 99% on 2 L of O2) Gastroenterological: See  HPI Genitourinary: Denies darkened urine Hematological: Positive for easy bruising/bleeding Dermatological: Denies jaundice Psychological: Mood is depressed about still being in the skilled nursing facility and she is very anxious to get out of the facility as soon as possible   Medications Current Outpatient Medications  Medication Sig Dispense Refill  . ACCU-CHEK AVIVA PLUS test strip TEST BLOD SUGAR TWICE DAILY AND LANCETS TWICE DAILY 200 each 0  . ACCU-CHEK SOFTCLIX LANCETS lancets USE TO CHECK BLOOD SUGAR TWICE A DAY 100 each 0  . acetaminophen (TYLENOL) 325 MG tablet Take 2 tablets (650 mg total) by mouth every 6 (six) hours as needed for mild pain (or Fever >/= 101). (Patient taking differently: Take 325 mg by mouth every 6 (six) hours as needed for mild pain (or Fever >/= 101). ) 20 tablet 1  . albuterol (PROVENTIL HFA;VENTOLIN HFA) 108 (90 Base) MCG/ACT inhaler Inhale 2 puffs into the lungs every 4 (four) hours as needed for wheezing. 1 Inhaler 6  . aspirin EC 81 MG tablet Take 1 tablet (81 mg total) by mouth daily. With food 30 tablet 5  . escitalopram (LEXAPRO) 10 MG tablet Take 1 tablet (10 mg total) by mouth daily. 30 tablet 0  . famotidine (PEPCID) 20 MG tablet Take 1 tablet (20 mg total) by mouth daily as needed for heartburn or indigestion. 30 tablet 2  . Fluticasone-Salmeterol (ADVAIR) 250-50 MCG/DOSE AEPB Inhale 1 puff into the lungs 2 (two) times daily.    Marland Kitchen latanoprost (XALATAN) 0.005 % ophthalmic solution USE 1 DROP IN BOTH EYES AT BEDTIME (Patient taking differently: Place 1 drop into both eyes at  bedtime. USE 1 DROP IN BOTH EYES AT BEDTIME) 2.5 mL 10  . levothyroxine (SYNTHROID, LEVOTHROID) 75 MCG tablet Take 1 tablet (75 mcg total) by mouth daily before breakfast. 30 tablet 5  . Melatonin 3 MG TABS Take 1-2 tablets (3-6 mg total) by mouth at bedtime as needed (insomnia). 30 tablet 0  . metoprolol tartrate (LOPRESSOR) 25 MG tablet Take 1.5 tablets (37.5 mg total) by mouth  2 (two) times daily. 270 tablet 1  . ondansetron (ZOFRAN-ODT) 4 MG disintegrating tablet Take 1 tablet (4 mg total) by mouth every 8 (eight) hours as needed for nausea or vomiting. 20 tablet 0  . OVER THE COUNTER MEDICATION CPAP    . OXYGEN Inhale 2 L into the lungs continuous.     . pantoprazole (PROTONIX) 40 MG tablet Take 1 tablet (40 mg total) by mouth 2 (two) times daily. 90 tablet 0  . rosuvastatin (CRESTOR) 40 MG tablet Take 1 tablet (40 mg total) by mouth daily. (Patient taking differently: Take 40 mg by mouth every evening. ) 30 tablet 5  . BD ULTRA-FINE PEN NEEDLES 29G X 12.7MM MISC USE TWICE A DAY 100 each 0   No current facility-administered medications for this visit.     Allergies No Known Allergies  Histories Past Medical History:  Diagnosis Date  . Anterolisthesis    Grade 1, L4-5  . Bronchospasm 05/28/2012  . CHEST PAIN 11/18/2007  . DEGENERATIVE JOINT DISEASE 10/06/2006  . DEPRESSION 09/26/2008  . DIABETES MELLITUS 10/06/2006  . Diverticulosis    [id number]  . GERD (gastroesophageal reflux disease) 07/25/2013  . HYPERLIPIDEMIA 01/11/2009  . HYPERTENSION 10/06/2006  . INSOMNIA 09/26/2008  . Internal hemorrhoids   . OBSTRUCTIVE SLEEP APNEA 06/23/2008   Severe OSA per sleep study 2010, Rx a CPAP  . Pain in joint, multiple sites 11/10/2006  . UNSPECIFIED ANEMIA 12/10/2009  . UTI (urinary tract infection) 11/2017   Past Surgical History:  Procedure Laterality Date  . BIOPSY  12/28/2017   Procedure: BIOPSY;  Surgeon: Irving Copas., MD;  Location: [facility];  Service: Gastroenterology;;  . COLONOSCOPY  08/01/2011   Procedure: COLONOSCOPY;  Surgeon: Inda Castle, MD;  Location: [facility];  Service: Endoscopy;  Laterality: N/A;  . ESOPHAGOGASTRODUODENOSCOPY (EGD) WITH PROPOFOL N/A 12/28/2017   Procedure: ESOPHAGOGASTRODUODENOSCOPY (EGD) WITH PROPOFOL;  Surgeon: Rush Landmark Telford Nab., MD;  Location: [facility];  Service: Gastroenterology;  Laterality: N/A;  .  LEFT HEART CATH AND CORONARY ANGIOGRAPHY N/A 10/22/2017   Procedure: LEFT HEART CATH AND CORONARY ANGIOGRAPHY;  Surgeon: Jettie Booze, MD;  Location: [facility];  Service: Cardiovascular;  Laterality: N/A;  . 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 Right knee replacement  2005   Social History   Socioeconomic History  . Marital status: Widowed    Spouse name: Not on file  . Number of children: 4  . Years of education: Not on file  . Highest education level: Not on file  Occupational History  . Occupation: disability    Employer: UNEMPLOYED  Social Needs  . Financial resource strain: Not on file  . Food insecurity:    Worry: Not on file    Inability: Not on file  . Transportation needs:    Medical: Not on file    Non-medical: Not on file  Tobacco Use  . Smoking status: Former Smoker    Packs/day: 0.20    Years: 4.00    Pack years: 0.80    Types: Cigarettes  Last attempt to quit: 05/13/1995    Years since quitting: 22.7  . Smokeless tobacco: Never Used  Substance and Sexual Activity  . Alcohol use: Not Currently  . Drug use: No  . Sexual activity: Not Currently  Lifestyle  . Physical activity:    Days per week: Not on file    Minutes per session: Not on file  . Stress: Not on file  Relationships  . Social connections:    Talks on phone: Not on file    Gets together: Not on file    Attends religious service: Not on file    Active member of club or organization: Not on file    Attends meetings of clubs or organizations: Not on file    Relationship status: Not on file  . Intimate partner violence:    Fear of current or ex partner: Not on file    Emotionally abused: Not on file    Physically abused: Not on file    Forced sexual activity: Not on file  Other Topics Concern  . Not on file  Social History Narrative   Widow , lives by herself   Lost a son, 3 living    Lost husband   Family History  Problem Relation Age of Onset  . Asthma Mother   . Stroke  Mother   . Diabetes Other        M, B, S  . Hypertension Sister        M, S,B  . Pancreatic cancer Brother   . Colon cancer Neg Hx   . Prostate cancer Neg Hx   . Breast cancer Neg Hx   . Esophageal cancer Neg Hx   . Liver disease Neg Hx   . Rectal cancer Neg Hx   . Stomach cancer Neg Hx    I have reviewed her medical, social, and family history in detail and updated the electronic medical record as necessary.    PHYSICAL EXAMINATION  BP (!) 142/88   Pulse 80   Ht 5\' 9"  (1.753 m)   Wt 224 lb (101.6 kg)   BMI 33.08 kg/m  Wt Readings from Last 3 Encounters:  01/29/18 224 lb (101.6 kg)  12/28/17 226 lb 13.7 oz (102.9 kg)  12/23/17 226 lb 13.7 oz (102.9 kg)  GEN: Chronically ill-appearing, in wheelchair, no acute distress after 15 to 20 minutes of talking with the patient PSYCH: Cooperative EYE: Conjunctivae pink, sclerae anicteric ENT: Nasal cannula in place, MMM, without oral ulcers, no erythema or exudates noted NECK: Supple, enlarged neck girth CV: Non-tachycardic without rubs or gallops RESP: Decreased breath sounds at the bases bilaterally but no significant adventitious sounds present GI: Obese, rounded, NABS, soft, NT/ND, without rebound or guarding, no HSM appreciated MSK/EXT: Bilateral pedal edema present SKIN: No jaundice NEURO:  Alert & Oriented x 3, no focal deficits   REVIEW OF DATA  I reviewed the following data at the time of this encounter:  GI Procedures and Studies  8/19 EGD - Tortuous esophagus. No other gross lesions in esophagus. Biopsied to rule out EoE. - Medium-sized hiatal hernia. - Erythematous mucosa in the greater curvature of the gastric antrum. No other gross lesions in the stomach. Biopsied to rule out gastritis & HP. - No gross lesions in the duodenal bulb, in the first portion of the duodenum and in the second portion of the duodenum.  Laboratory Studies  Reviewed in epic  Imaging Studies  8/19 CT abdomen pelvis without  contrast IMPRESSION: 1.  Colonic diverticulosis without acute diverticulitis nor acute intra-abdominal/pelvic process. 2. Bibasilar ground-glass opacities with subpleural sparing associated with nonspecific interstitial pneumonia. Bilateral lower lobe bronchiectasis.   ASSESSMENT  Ms. Tusing is a 67 y.o. 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  The patient is seen today for evaluation and management of:  1. Dysphagia, unspecified type   2. History of Helicobacter pylori infection   3. History of abdominal pain    The patient returns for follow-up and is doing much better from regards to her postprandial abdominal discomfort with associated nausea and vomiting that she was experiencing for the course of last few months.  She had a history of H. pylori which we found during her recent upper endoscopy with gastric biopsies and was treated upon her discharge from the hospital into the facility that she is been.  She has not had eradication noted however.  She remains on high-dose PPI therapy.  I have asked the facility to please stop/hold her PPI for the next 3 to 4 weeks.  When she is off the PPI for 3 to 4 weeks she should have a stool H. pylori serology sent for evaluation and she can be restarted on PPI once daily thereafter.  Once her stool has confirmed eradication we will then Try to down titrate her PPI as much as we can to the lowest possible dose.  In regards to her dysphagia I suspect the dysphasia as a result of her tortuous esophagus though she could have an underlying dysmotility as well.  She is on home oxygen and I think a manometry unless we absolutely are indicated may have more risks at this point while she is still recovering from her recent hospital stay.  We will see her in follow-up and have asked her to continue to chew thoroughly and take small portions and bites as able.  We will continue her PPI  once we have obtained the HP serology as noted above.  We will see her in follow-up.  She is up-to-date for colon cancer screening and would not be due until 2023 (however will need to understand her medical comorbidities at that time).  All patient questions were answered, to the best of my ability, and the patient agrees to the aforementioned plan of action with follow-up as indicated.   PLAN  1. History of Helicobacter pylori infection - Helicobacter pylori special antigen; Future to confirm eradication (off PPI for 3 to 4 weeks before testing)  2. History of abdominal pain - After HP treatment - May restart PPI after HP serology has been obtained  3. Dysphagia, unspecified type - Continue to eat small portions and to thoroughly - Consider possible role of manometry testing in the future    Orders Placed This Encounter  Procedures  . Helicobacter pylori special antigen    New Prescriptions   No medications on file   Modified Medications   Modified Medication Previous Medication   BD ULTRA-FINE PEN NEEDLES 29G X 12.7MM MISC BD ULTRA-FINE PEN NEEDLES 29G X 12.7MM MISC      USE TWICE A DAY    USE TWICE A DAY    Planned Follow Up: No follow-ups on file.   Justice Britain, MD [facility] Office # [phone]

## 2018-01-29 NOTE — Telephone Encounter (Signed)
Received F2F Physician Orders from [facility], sent with OV notes;  orwarded to provider/SLS 09/20

## 2018-01-29 NOTE — Patient Instructions (Addendum)
Normal BMI (Body Mass Index- based on height and weight) is between 23 and 30. Your BMI today is Body mass index is 33.08 kg/m. Marland Kitchen Please consider follow up  regarding your BMI with your Primary Care Provider.  Your provider has requested that you go to the basement level for lab work before leaving today. Press "B" on the elevator. The lab is located at the first door on the left as you exit the elevator.   Plan for H pylori stool testing. Need to be off protonix for 3 weeks then have test done  We have scheduled you for a follow up appointment with Dr Rush Landmark on 03/10/18 at 330

## 2018-01-31 ENCOUNTER — Other Ambulatory Visit: Payer: Self-pay | Admitting: Endocrinology

## 2018-02-01 ENCOUNTER — Encounter: Payer: Self-pay | Admitting: Gastroenterology

## 2018-02-01 DIAGNOSIS — R1310 Dysphagia, unspecified: Secondary | ICD-10-CM | POA: Insufficient documentation

## 2018-02-01 DIAGNOSIS — Z8619 Personal history of other infectious and parasitic diseases: Secondary | ICD-10-CM | POA: Insufficient documentation

## 2018-02-01 DIAGNOSIS — E039 Hypothyroidism, unspecified: Secondary | ICD-10-CM | POA: Diagnosis not present

## 2018-02-01 DIAGNOSIS — J9611 Chronic respiratory failure with hypoxia: Secondary | ICD-10-CM | POA: Diagnosis not present

## 2018-02-01 DIAGNOSIS — I10 Essential (primary) hypertension: Secondary | ICD-10-CM | POA: Diagnosis not present

## 2018-02-01 DIAGNOSIS — E782 Mixed hyperlipidemia: Secondary | ICD-10-CM | POA: Diagnosis not present

## 2018-02-03 ENCOUNTER — Other Ambulatory Visit: Payer: Self-pay | Admitting: Licensed Clinical Social Worker

## 2018-02-03 ENCOUNTER — Inpatient Hospital Stay: Payer: Medicare Other | Admitting: Internal Medicine

## 2018-02-03 NOTE — Patient Outreach (Addendum)
[place] [facility]) Care Management  02/03/2018  [name] 1950-07-17 [id number]  [facility] CSW arrived at [facility] to complete [facility] Consult with patient but was unable to find patient in her room. [facility] CSW met with with SNF social worker and was informed that patient discharged back home yesterday with [facility]: PT, OT and ST. Patient also discharged home with ongoing personal care services through [facility] which is 80 hours per month.[facility] CSW will refer case back to [facility] RNCM at this time and will sign off.   Eula Fried, BSW, MSW, [email or web address] Phone: [phone] Fax: [phone]

## 2018-02-04 ENCOUNTER — Other Ambulatory Visit: Payer: Self-pay

## 2018-02-04 ENCOUNTER — Telehealth: Payer: Self-pay | Admitting: Endocrinology

## 2018-02-04 NOTE — Telephone Encounter (Signed)
Pt. stated we refilled a RX refill from her pharmacy with insulin pins she in no longer on.

## 2018-02-04 NOTE — Telephone Encounter (Signed)
Request was electronic and pens were still in her current med list, but I have D/C'd them so this will not happen again also informed pt

## 2018-02-05 ENCOUNTER — Other Ambulatory Visit: Payer: Self-pay

## 2018-02-05 NOTE — Patient Outreach (Signed)
[facility]) Care Management  02/05/2018  [name] 03/12/1951 [id number]   Medication Adherence call to [name] spoke with patient she is no longer taking Telmisarta 40 mg doctor took her off. [name] is showing past due under [place].    [facility] Management Direct Dial [phone]  Fax [phone] [email or web address]

## 2018-02-05 NOTE — Patient Outreach (Signed)
[facility]) Care Management  02/05/2018  [name] 05-23-1950 [id number]   67 year old with hsitory ofPericarditis,OSA, asthma, chornic hypoxia respiratory failure, HTN, DM.  8/14-9/24 [place] skilled facility for rehabilitation. 8/18-8/21 postprandial nausea/vomiting-hospitalized and discharged to Skilled facility. 8/12-8/14 with chest pain.  7/9-7/12/33fr tachycardia 6/10-6/15 with acute respiratory failure with hypoxia.  RNCM called to follow up. Client reports she is feeling better. She remains on oxygen at 2l/[place].   Client reports she is walking better, but still gets short of breath with activity. She reports [place]  But is not clear how many hours she is supposed to have or how many days/week. She request follow up to ensure she is receiving the services allotted. She also reports difficulty with transportation getting to her primary care office in [place]. She states her son is not able to take her to her primary care appointment.   Medications reviewed. Client states she is not sure how to use the Advair. She reports her son showed her, but she is not sure if she is correct in how she is using it. She also acknowledges that she is having difficulty managing her medications.   Plan: pharmacy referral, social work regarding other transportation resources, home visit scheduled for next week. THN CM Care Plan Problem One     Most Recent Value  Care Plan Problem One  at risk for readmission as evidence by recent admission/skilled facility stay.  Role Documenting the Problem One  Care Management [place] Problem [place]  Active  [facility]   client will not be readmitted within the next 31 days.  THN Long Term Goal Start Date  02/05/18  THN Long Term Goal Met Date  02/05/18  Interventions for Problem One Long Term Goal  discussed overall sense of well-being, discussed  medications, start of care for home health.  THN CM Short Term Goal #1   client will verbalize start of care with home health within the next 1-2 weeks.  THN CM Short Term Goal #1 Start Date  02/05/18  Interventions for Short Term Goal #1  RNCM called home health agency regarding start of care date.  THN CM Short Term Goal #2   client will take medications as prescribed within the next 30 days.  THN CM Short Term Goal #2 Start Date  02/05/18  Interventions for Short Term Goal #2  pharmacy referral completed.     JThea Silversmith RN, MSN, BSouth PlainfieldCoordinator Cell: [phone]

## 2018-02-08 ENCOUNTER — Telehealth: Payer: Self-pay | Admitting: Endocrinology

## 2018-02-08 ENCOUNTER — Other Ambulatory Visit: Payer: Self-pay

## 2018-02-08 ENCOUNTER — Telehealth: Payer: Self-pay | Admitting: *Deleted

## 2018-02-08 ENCOUNTER — Telehealth: Payer: Self-pay | Admitting: Internal Medicine

## 2018-02-08 MED ORDER — ACCU-CHEK SOFTCLIX LANCETS MISC: 0 refills | Status: DC

## 2018-02-08 MED ORDER — ACCU-CHEK AVIVA DEVI: 0 refills | Status: AC

## 2018-02-08 MED ORDER — GLUCOSE BLOOD VI STRP: ORAL_STRIP | 0 refills | Status: DC

## 2018-02-08 NOTE — Telephone Encounter (Signed)
Pt needs [place] to call in for a new meter, test strips, and lancets to [facility] on [address]. She did not want to call her pharmacy [phone]

## 2018-02-08 NOTE — Telephone Encounter (Signed)
sent 

## 2018-02-08 NOTE — Telephone Encounter (Signed)
Received F2F Physician Orders from [facility], sent with 12/07/17 OV notes; forwarded to provider/SLS 09/30

## 2018-02-08 NOTE — Patient Outreach (Signed)
[facility]) Care Management  02/08/2018  [name] 21-Jul-1950 [id number]  Care coordination:  RNCM received phone call from [facility] stating they were having trouble contacting client. RNCM called and spoke with [name]. She states that she spoke with [facility] and they will be coming to see her tomorrow. She also reports that her she will be getting a new personal care assistant tomorrow.   Plan: home visit previously scheduled for this week.  Thea Silversmith, RN, MSN, [place] Coordinator Cell: [phone]

## 2018-02-08 NOTE — Telephone Encounter (Signed)
Noted  

## 2018-02-08 NOTE — Telephone Encounter (Signed)
Copied from [place] [id number]. Topic: Quick Communication - See Telephone Encounter >> Feb 08, 2018  4:43 PM Nils Flack wrote: CRM for notification. See Telephone encounter for: 02/08/18. Melissa from [facility] called  - they will do start of care for pt tomorrow for PT, OT, Speech, Nursing, and Home Health Aid  Cb is [phone] Pt requested to wait until this week to begin

## 2018-02-09 ENCOUNTER — Telehealth: Payer: Self-pay | Admitting: Internal Medicine

## 2018-02-09 DIAGNOSIS — E785 Hyperlipidemia, unspecified: Secondary | ICD-10-CM | POA: Diagnosis not present

## 2018-02-09 DIAGNOSIS — R0902 Hypoxemia: Secondary | ICD-10-CM | POA: Diagnosis not present

## 2018-02-09 DIAGNOSIS — K219 Gastro-esophageal reflux disease without esophagitis: Secondary | ICD-10-CM | POA: Diagnosis not present

## 2018-02-09 DIAGNOSIS — Z7982 Long term (current) use of aspirin: Secondary | ICD-10-CM | POA: Diagnosis not present

## 2018-02-09 DIAGNOSIS — E039 Hypothyroidism, unspecified: Secondary | ICD-10-CM | POA: Diagnosis not present

## 2018-02-09 DIAGNOSIS — Z87891 Personal history of nicotine dependence: Secondary | ICD-10-CM | POA: Diagnosis not present

## 2018-02-09 DIAGNOSIS — Z96653 Presence of artificial knee joint, bilateral: Secondary | ICD-10-CM | POA: Diagnosis not present

## 2018-02-09 DIAGNOSIS — J449 Chronic obstructive pulmonary disease, unspecified: Secondary | ICD-10-CM | POA: Diagnosis not present

## 2018-02-09 DIAGNOSIS — E1136 Type 2 diabetes mellitus with diabetic cataract: Secondary | ICD-10-CM | POA: Diagnosis not present

## 2018-02-09 DIAGNOSIS — N183 Chronic kidney disease, stage 3 (moderate): Secondary | ICD-10-CM | POA: Diagnosis not present

## 2018-02-09 DIAGNOSIS — E1122 Type 2 diabetes mellitus with diabetic chronic kidney disease: Secondary | ICD-10-CM | POA: Diagnosis not present

## 2018-02-09 DIAGNOSIS — Z7951 Long term (current) use of inhaled steroids: Secondary | ICD-10-CM | POA: Diagnosis not present

## 2018-02-09 DIAGNOSIS — J9611 Chronic respiratory failure with hypoxia: Secondary | ICD-10-CM | POA: Diagnosis not present

## 2018-02-09 DIAGNOSIS — D631 Anemia in chronic kidney disease: Secondary | ICD-10-CM | POA: Diagnosis not present

## 2018-02-09 DIAGNOSIS — M1991 Primary osteoarthritis, unspecified site: Secondary | ICD-10-CM | POA: Diagnosis not present

## 2018-02-09 DIAGNOSIS — I509 Heart failure, unspecified: Secondary | ICD-10-CM | POA: Diagnosis not present

## 2018-02-09 DIAGNOSIS — G4733 Obstructive sleep apnea (adult) (pediatric): Secondary | ICD-10-CM | POA: Diagnosis not present

## 2018-02-09 DIAGNOSIS — Z87440 Personal history of urinary (tract) infections: Secondary | ICD-10-CM | POA: Diagnosis not present

## 2018-02-09 DIAGNOSIS — Z9981 Dependence on supplemental oxygen: Secondary | ICD-10-CM | POA: Diagnosis not present

## 2018-02-09 DIAGNOSIS — R1319 Other dysphagia: Secondary | ICD-10-CM | POA: Diagnosis not present

## 2018-02-09 DIAGNOSIS — I130 Hypertensive heart and chronic kidney disease with heart failure and stage 1 through stage 4 chronic kidney disease, or unspecified chronic kidney disease: Secondary | ICD-10-CM | POA: Diagnosis not present

## 2018-02-09 DIAGNOSIS — K5792 Diverticulitis of intestine, part unspecified, without perforation or abscess without bleeding: Secondary | ICD-10-CM | POA: Diagnosis not present

## 2018-02-09 NOTE — Telephone Encounter (Signed)
Advised patient: Needs office visit, I have not seen her since the last admission. Monitor BPs at home, bring a log. If consistently more than 160/90, do not wait for a visit, let me know with a phone call.

## 2018-02-09 NOTE — Telephone Encounter (Signed)
FYI about BP.

## 2018-02-09 NOTE — Telephone Encounter (Signed)
Spoke w/ [name], verbal orders given w/ recommendations. She will inform Pt. Appt scheduled 02/24/2018.

## 2018-02-09 NOTE — Telephone Encounter (Signed)
Copied from [place] [id number]. Topic: General - Other >> Feb 09, 2018 11:18 AM Yvette Rack wrote: Reason for CRM: RN Debra from [facility] [phone] calling for verbal order and BP reading from today which was 160/100 after she took medicine  Verbal nurse visit 2 times a week for 3 weeks  1 time a week for 2 weeks

## 2018-02-10 ENCOUNTER — Other Ambulatory Visit: Payer: Self-pay

## 2018-02-10 ENCOUNTER — Other Ambulatory Visit: Payer: Self-pay | Admitting: Pharmacist

## 2018-02-10 ENCOUNTER — Telehealth (HOSPITAL_COMMUNITY): Payer: Self-pay

## 2018-02-10 ENCOUNTER — Telehealth: Payer: Self-pay | Admitting: Internal Medicine

## 2018-02-10 DIAGNOSIS — E1122 Type 2 diabetes mellitus with diabetic chronic kidney disease: Secondary | ICD-10-CM | POA: Diagnosis not present

## 2018-02-10 DIAGNOSIS — Z87440 Personal history of urinary (tract) infections: Secondary | ICD-10-CM | POA: Diagnosis not present

## 2018-02-10 DIAGNOSIS — E785 Hyperlipidemia, unspecified: Secondary | ICD-10-CM | POA: Diagnosis not present

## 2018-02-10 DIAGNOSIS — N183 Chronic kidney disease, stage 3 (moderate): Secondary | ICD-10-CM | POA: Diagnosis not present

## 2018-02-10 DIAGNOSIS — G4733 Obstructive sleep apnea (adult) (pediatric): Secondary | ICD-10-CM | POA: Diagnosis not present

## 2018-02-10 DIAGNOSIS — E1136 Type 2 diabetes mellitus with diabetic cataract: Secondary | ICD-10-CM | POA: Diagnosis not present

## 2018-02-10 DIAGNOSIS — J9611 Chronic respiratory failure with hypoxia: Secondary | ICD-10-CM | POA: Diagnosis not present

## 2018-02-10 DIAGNOSIS — Z96653 Presence of artificial knee joint, bilateral: Secondary | ICD-10-CM | POA: Diagnosis not present

## 2018-02-10 DIAGNOSIS — Z87891 Personal history of nicotine dependence: Secondary | ICD-10-CM | POA: Diagnosis not present

## 2018-02-10 DIAGNOSIS — E039 Hypothyroidism, unspecified: Secondary | ICD-10-CM | POA: Diagnosis not present

## 2018-02-10 DIAGNOSIS — K5792 Diverticulitis of intestine, part unspecified, without perforation or abscess without bleeding: Secondary | ICD-10-CM | POA: Diagnosis not present

## 2018-02-10 DIAGNOSIS — I130 Hypertensive heart and chronic kidney disease with heart failure and stage 1 through stage 4 chronic kidney disease, or unspecified chronic kidney disease: Secondary | ICD-10-CM | POA: Diagnosis not present

## 2018-02-10 DIAGNOSIS — Z9981 Dependence on supplemental oxygen: Secondary | ICD-10-CM | POA: Diagnosis not present

## 2018-02-10 DIAGNOSIS — Z7982 Long term (current) use of aspirin: Secondary | ICD-10-CM | POA: Diagnosis not present

## 2018-02-10 DIAGNOSIS — I509 Heart failure, unspecified: Secondary | ICD-10-CM | POA: Diagnosis not present

## 2018-02-10 DIAGNOSIS — J449 Chronic obstructive pulmonary disease, unspecified: Secondary | ICD-10-CM | POA: Diagnosis not present

## 2018-02-10 DIAGNOSIS — D631 Anemia in chronic kidney disease: Secondary | ICD-10-CM | POA: Diagnosis not present

## 2018-02-10 DIAGNOSIS — Z7951 Long term (current) use of inhaled steroids: Secondary | ICD-10-CM | POA: Diagnosis not present

## 2018-02-10 DIAGNOSIS — R1319 Other dysphagia: Secondary | ICD-10-CM | POA: Diagnosis not present

## 2018-02-10 DIAGNOSIS — M1991 Primary osteoarthritis, unspecified site: Secondary | ICD-10-CM | POA: Diagnosis not present

## 2018-02-10 DIAGNOSIS — K219 Gastro-esophageal reflux disease without esophagitis: Secondary | ICD-10-CM | POA: Diagnosis not present

## 2018-02-10 NOTE — Telephone Encounter (Signed)
Copied from [place] [id number]. Topic: Quick Communication - See Telephone Encounter >> Feb 10, 2018  4:24 PM Bea Graff, NT wrote: CRM for notification. See Telephone encounter for: 02/10/18. Denton Brick, nurse case manager care coordinator calling and states during the home visit the home health physical therapist was at the appt and reported pts blood pressure today was 160/100 and yesterday the blood pressure was also 160/100. CB#: [phone]

## 2018-02-10 NOTE — Telephone Encounter (Signed)
According to the medication list she takes metoprolol 25 mg one-point food 1.5 tablets B.I.D. Increase metoprolol 25 mg: 2 tablets B.I.D. Watch for bradycardia, call if pulse is less than 55, call if BP remains elevated in the next 4 to 5 days Consider amlodipine

## 2018-02-10 NOTE — Patient Outreach (Signed)
[facility]) Care Management  02/10/2018   [name] Apr 21, 1951 [id number]  Subjective:   67 year old female referred to [facility] for care management s/p multiple hospitalizations and ED visits.  [place] services requested for medication management.  PMHx includes, but not limited to, hx pericarditis s/p treatment, CHD, asthma, diabetes, and GERD.   Patient noted to [facility] RN that she was confused by her medications and by her Advair inhaler. Contacted patient today, HIPAA verifiers identified.   She did not want to talk for long, as she was awaiting her aid to arrive to help her fix breakfast and take her morning medications. She notes that her son helped her learn how to use the Advair inhaler, and declines wanting a pharmacist home visit for device demonstration. She notes that she has been using Advair 2 puffs once daily, and that recently received Wixela (generic of Advair) but will finish out her existing supply of Advair first.   Upon review of medications, she indicates that she is taking escitalopram when she "is depressed" and feels like she needs it.   She notes that she is waiting to hear from her pharmacy regarding when her glucometer will be ready for pick up.   She confirms that she is NOT taking pantoprazole since her last gastroenterology appointment in preparation for H pylori testing (see 01/29/2018 office visit note).   She indicates that she often is confused by her medications, and would like to have her medications bubble packed (adherence packaging)   Objective:   Current Medications:  Current Outpatient Medications  Medication Sig Dispense Refill  . ACCU-CHEK SOFTCLIX LANCETS lancets USE TO CHECK BLOOD SUGAR TWICE A DAY E11.9 100 each 0  . acetaminophen (TYLENOL) 325 MG tablet Take 2 tablets (650 mg total) by mouth every 6 (six) hours as needed for mild pain (or Fever >/= 101). (Patient taking differently: Take 325 mg by  mouth every 6 (six) hours as needed for mild pain (or Fever >/= 101). ) 20 tablet 1  . albuterol (PROVENTIL HFA;VENTOLIN HFA) 108 (90 Base) MCG/ACT inhaler Inhale 2 puffs into the lungs every 4 (four) hours as needed for wheezing. 1 Inhaler 6  . aspirin EC 81 MG tablet Take 1 tablet (81 mg total) by mouth daily. With food 30 tablet 5  . Blood Glucose Monitoring Suppl (ACCU-CHEK AVIVA) device Use as instructed to check blood sugar twice daily dx E11.9 1 each 0  . escitalopram (LEXAPRO) 10 MG tablet Take 1 tablet (10 mg total) by mouth daily. 30 tablet 0  . famotidine (PEPCID) 20 MG tablet Take 1 tablet (20 mg total) by mouth daily as needed for heartburn or indigestion. 30 tablet 2  . Fluticasone-Salmeterol (ADVAIR) 250-50 MCG/DOSE AEPB Inhale 1 puff into the lungs 2 (two) times daily.    Marland Kitchen glucose blood (ACCU-CHEK AVIVA PLUS) test strip TEST BLOD SUGAR TWICE DAILY AND LANCETS TWICE DAILY E11.9 200 each 0  . latanoprost (XALATAN) 0.005 % ophthalmic solution USE 1 DROP IN BOTH EYES AT BEDTIME (Patient taking differently: Place 1 drop into both eyes at bedtime. USE 1 DROP IN BOTH EYES AT BEDTIME) 2.5 mL 10  . levothyroxine (SYNTHROID, LEVOTHROID) 75 MCG tablet Take 1 tablet (75 mcg total) by mouth daily before breakfast. 30 tablet 5  . metoprolol tartrate (LOPRESSOR) 25 MG tablet Take 1.5 tablets (37.5 mg total) by mouth 2 (two) times daily. 270 tablet 1  . OVER THE COUNTER MEDICATION CPAP    .  OXYGEN Inhale 2 L into the lungs continuous.     . rosuvastatin (CRESTOR) 40 MG tablet Take 1 tablet (40 mg total) by mouth daily. (Patient taking differently: Take 40 mg by mouth every evening. ) 30 tablet 5  . Melatonin 3 MG TABS Take 1-2 tablets (3-6 mg total) by mouth at bedtime as needed (insomnia). (Patient not taking: Reported on 02/10/2018) 30 tablet 0  . ondansetron (ZOFRAN-ODT) 4 MG disintegrating tablet Take 1 tablet (4 mg total) by mouth every 8 (eight) hours as needed for nausea or vomiting. (Patient  not taking: Reported on 02/10/2018) 20 tablet 0  . pantoprazole (PROTONIX) 40 MG tablet Take 1 tablet (40 mg total) by mouth 2 (two) times daily. (Patient not taking: Reported on 02/10/2018) 90 tablet 0   No current facility-administered medications for this visit.     Functional Status:  In your present state of health, do you have any difficulty performing the following activities: 12/27/2017 12/27/2017  Hearing? - N  Vision? - N  Difficulty concentrating or making decisions? - N  Walking or climbing stairs? - Y  Dressing or bathing? - N  Doing errands, shopping? [place] and eating ? - -  Using the Toilet? - -  In the past six months, have you accidently leaked urine? - -  Do you have problems with loss of bowel control? - -  Managing your Medications? - -  Managing your Finances? - -  Housekeeping or managing your Housekeeping? - -  Some recent data might be hidden    Fall/Depression Screening: Fall Risk  10/26/2017 08/06/2017 03/31/2017  Falls in the past year? No Yes Yes  Number falls in past yr: - 1 1  Injury with Fall? - No No  Comment - - -  Risk for fall due to : - History of fall(s) -  Risk for fall due to: Comment - patient reports that she fell once last summer when she slipped during mopping her floor; no loss of consiousness and no injury -  Follow up - Falls prevention discussed;Education provided Education provided;Falls prevention discussed   PHQ 2/9 Scores 10/26/2017 08/06/2017 03/31/2017 03/26/2016 10/31/2015 02/22/2015 10/26/2013  PHQ - 2 Score 3 1 2  0 0 0 6  PHQ- 9 Score 13 - 5 - - - -  Exception Documentation - - - - - Other- indicate reason in comment box -  Not completed - - - - - Pt's son passed in June 2016 -    Assessment:   Drugs sorted by system:  Neurologic/Psychologic: escitalopram, melatonin  Cardiovascular: aspirin, metoprolol tartrate  Pulmonary/Allergy: albuterol, Advair, rosuvastatin  Gastrointestinal: famotidine,  ondansetron,   Topical: latanoprost ophthalmic  Pain: acetaminophen,   Vitamins/Minerals/Supplements:   Medication Review Findings: - Escitalopram: Patient is taking PRN instead of daily as therapeutically appropriate - Advair: Patient is taking 2 puffs at the same time, instead of 1 puff twice daily as appropriate per pharmacokinetic properties of the drug - Patient notes she is interested in adherence packaging. The closest pharmacy to the patient that offers pill packing is [facility]. She would need aspirin, escitalopram, levothyroxine, metoprolol, and rosuvastatin packaged as her chronic, daily medications  Plan:  - Counseled patient on the importance of adherence to escitalopram for ~6-8 weeks to see full therapeutic benefit - Counseled patient to take Advair 1 puff twice daily as directed due to half life of the drug - Patient amenable to transferring prescriptions to [facility].  Contacted pharmacy, asked them to transfer patient's profile. Provided patient's insurance information, demographics, and an updated medication list.  - Will f/u with [facility]/patient next week   Catie Darnelle Maffucci, PharmD PGY2 Ambulatory Care Pharmacy Resident, [place] Phone: [phone]

## 2018-02-10 NOTE — Telephone Encounter (Signed)
Called to follow up with patient in regards to Pulmonary Rehab - Patient stated she can't keep up with all dr appts at this time and will call if and when she is ready for PR. Closed referral.

## 2018-02-10 NOTE — Patient Outreach (Addendum)
[facility]) Care Management   02/10/2018  [name] 06/21/1950 [id number]  Melissa KEEVEN is an 67 y.o. female  Subjective: client states her breathing has improved since hospitalization and rehabilitation stay.  Objective:  BP (!) 160/100 Comment: per home health physical therapist.  Pulse 90   Resp 20   Wt 216 lb 12.8 oz (98.3 kg)   SpO2 99% Comment: per home health physical therapist.  BMI 32.02 kg/m   Review of Systems  Respiratory:       Lungs sounds clear, decreased breath sounds.  Cardiovascular:       S1S2 noted, regular, 1+ edema to lower extremities.    Physical Exam skin warm, dry, color within normal limits.  Encounter Medications:   Outpatient Encounter Medications as of 02/10/2018  Medication Sig Note  . ACCU-CHEK SOFTCLIX LANCETS lancets USE TO CHECK BLOOD SUGAR TWICE A DAY E11.9   . acetaminophen (TYLENOL) 325 MG tablet Take 2 tablets (650 mg total) by mouth every 6 (six) hours as needed for mild pain (or Fever >/= 101). (Patient taking differently: Take 325 mg by mouth every 6 (six) hours as needed for mild pain (or Fever >/= 101). ) 02/10/2018: Taking ~ every other day  . albuterol (PROVENTIL HFA;VENTOLIN HFA) 108 (90 Base) MCG/ACT inhaler Inhale 2 puffs into the lungs every 4 (four) hours as needed for wheezing. 02/10/2018: Taking 2 puffs daily  . aspirin EC 81 MG tablet Take 1 tablet (81 mg total) by mouth daily. With food   . Blood Glucose Monitoring Suppl (ACCU-CHEK AVIVA) device Use as instructed to check blood sugar twice daily dx E11.9   . escitalopram (LEXAPRO) 10 MG tablet Take 1 tablet (10 mg total) by mouth daily. 02/10/2018: Takes PRN  . famotidine (PEPCID) 20 MG tablet Take 1 tablet (20 mg total) by mouth daily as needed for heartburn or indigestion. 02/10/2018: PRN  . Fluticasone-Salmeterol (ADVAIR) 250-50 MCG/DOSE AEPB Inhale 1 puff into the lungs 2 (two) times daily. 02/10/2018: Taking 2 puffs daily (at the same time)  .  glucose blood (ACCU-CHEK AVIVA PLUS) test strip TEST BLOD SUGAR TWICE DAILY AND LANCETS TWICE DAILY E11.9   . latanoprost (XALATAN) 0.005 % ophthalmic solution USE 1 DROP IN BOTH EYES AT BEDTIME (Patient taking differently: Place 1 drop into both eyes at bedtime. USE 1 DROP IN BOTH EYES AT BEDTIME)   . levothyroxine (SYNTHROID, LEVOTHROID) 75 MCG tablet Take 1 tablet (75 mcg total) by mouth daily before breakfast.   . Melatonin 3 MG TABS Take 1-2 tablets (3-6 mg total) by mouth at bedtime as needed (insomnia). (Patient not taking: Reported on 02/10/2018) 12/27/2017: Have not started  . metoprolol tartrate (LOPRESSOR) 25 MG tablet Take 1.5 tablets (37.5 mg total) by mouth 2 (two) times daily.   . ondansetron (ZOFRAN-ODT) 4 MG disintegrating tablet Take 1 tablet (4 mg total) by mouth every 8 (eight) hours as needed for nausea or vomiting. (Patient not taking: Reported on 02/10/2018)   . OVER THE COUNTER MEDICATION CPAP   . OXYGEN Inhale 2 L into the lungs continuous.    . pantoprazole (PROTONIX) 40 MG tablet Take 1 tablet (40 mg total) by mouth 2 (two) times daily. (Patient not taking: Reported on 02/10/2018)   . rosuvastatin (CRESTOR) 40 MG tablet Take 1 tablet (40 mg total) by mouth daily. (Patient taking differently: Take 40 mg by mouth every evening. )    No facility-administered encounter medications on file as of 02/10/2018.  Functional Status:   In your present state of health, do you have any difficulty performing the following activities: 02/10/2018 12/27/2017  Hearing? N -  Vision? N -  Difficulty concentrating or making decisions? N -  Walking or climbing stairs? Y -  Dressing or bathing? Y -  Doing errands, shopping? [place] and eating ? Y -  Using the Toilet? N -  In the past six months, have you accidently leaked urine? Y -  Do you have problems with loss of bowel control? N -  Managing your Medications? Y -  Managing your Finances? N -  Housekeeping or  managing your Housekeeping? Y -  Comment has personal care services. -  Some recent data might be hidden    Fall/Depression Screening:    Fall Risk  02/10/2018 10/26/2017 08/06/2017  Falls in the past year? Yes No Yes  Number falls in past yr: 2 or more - 1  Injury with Fall? Yes - No  Comment lower back pain - -  Risk Factor Category  High Fall Risk - -  Risk for fall due to : History of fall(s) - History of fall(s)  Risk for fall due to: Comment - - patient reports that she fell once last summer when she slipped during mopping her floor; no loss of consiousness and no injury  Follow up Falls prevention discussed - Falls prevention discussed;Education provided   [facility] 2/9 Scores 02/10/2018 10/26/2017 08/06/2017 03/31/2017 03/26/2016 10/31/2015 02/22/2015  PHQ - 2 Score _0 0 0 0  PHQ- 9 Score 13 13 - 5 - - -  Exception Documentation - - - - - - Other- indicate reason in comment box  Not completed - - - - - - Pt's son passed in June 2016    Assessment:  68 year old with hsitory ofPericarditis,OSA, asthma, chornic hypoxia respiratory failure, HTN, DM.  8/14-9/24 [place] skilled facility for rehabilitation. 8/18-8/21 postprandial nausea/vomiting-hospitalized and discharged to Skilled facility. 8/12-8/14 with chest pain.  7/9-7/12/95fr tachycardia 6/10-6/15 with acute respiratory failure with hypoxia.  Client reports having personal care assistant.  Home visit completed with home health physical therapist. Also present during visit was Beth Labbato, TRiverpointe Surgery CenterCM orienting. Client son came in at the end of the visit.  Client lives alone, she reports her son TChristia Readinglives about two blocks away and is supportive. She also states she has [place] who [place] passed [place].  Medications reviewed. Client is requesting pharmacist to assist with medication management and possible pill packs. Referral has already been made.  Advanced directives  discussed. Client request social work to assist with completing advanced directives. Client was also teary during visit when a call came in that she was not familiar with. Client states she gets tired of people calling her and hanging on the phone or not saying anything. Client states she has some depression and is receptive to social work referral. Client also expressed interest in other living options.  History of fall-client reports she fell when she first got home, tripping over her oxygen tubing. She denies falling since. She reports some back pain, but states she has not gone to the doctor and has not taken any medication. She reports that rest and repositioning helps.     Plan: [facility] Coord[facility], RNCM will update primary care and follow up with client next week telephonically.  [facility] Care  Plan Problem One     Most Recent Value  Care Plan Problem One  at risk for readmission as evidence by recent admission/skilled facility stay.  Role Documenting the Problem One  Care Management [place] for Problem One  Active  [facility] Long Term Goal   client will not be readmitted within the next 31 days.  THN Long Term Goal Start Date  02/05/18  Interventions for Problem One Long Term Goal  RNCM reviewed medications, discussed trasnporation issues/concerns, encouraged client to call RNCM as needed.  THN CM Short Term Goal #1   client will verbalize start of care with home health within the next 1-2 weeks.  THN CM Short Term Goal #1 Start Date  02/05/18  THN CM Short Term Goal #1 Met Date  02/10/18  THN CM Short Term Goal #2   client will take medications as prescribed within the next 30 days.  THN CM Short Term Goal #2 Start Date  02/05/18  Interventions for Short Term Goal #2  medications reviewed with client.     Thea Silversmith, RN, MSN, [place] Coordinator Cell: [phone]   02/10/18 1630 Addendum: RNCM noted upon reviewing chart that client's  blood pressure was 160/100 per home health nurse. Home health physical therapist reports that blood pressure today during home visit was 160/100. RNCM called primary care office to inform/message left with office staff, Santiago Glad. RNCM will continue to follow.  Thea Silversmith, RN, MSN, [place] Coordinator Cell: [phone]

## 2018-02-11 ENCOUNTER — Other Ambulatory Visit: Payer: Self-pay | Admitting: Internal Medicine

## 2018-02-11 ENCOUNTER — Other Ambulatory Visit: Payer: Self-pay | Admitting: Licensed Clinical Social Worker

## 2018-02-11 MED ORDER — METOPROLOL TARTRATE 25 MG PO TABS: ORAL_TABLET | ORAL | 1 refills | Status: DC

## 2018-02-11 NOTE — Telephone Encounter (Signed)
Form signed and faxed to [facility] at [phone].. Form sent for scanning.  

## 2018-02-11 NOTE — Telephone Encounter (Signed)
Called patient regarding increase in Metoprolol daily. Agreed to check BP at home and to call office if pulse and BP remains elevated.

## 2018-02-11 NOTE — Patient Outreach (Signed)
[place] [facility]) Care Management  02/11/2018  [name] 1950-05-27 [id number]   [facility] CSW received new referral on patient on 02/10/18. Patient is interested in advance directives, other living options and mental health resources. [facility] CSW completed outreach call and was able to reach patient successfully. HIPPA verifications provided as well. [facility] CSW re-introduced self, reason for call and of [facility] social worker services. [facility] CSW is very familiar with patient and has worked with her on and off over the past few years. Patient reports that she is interested in gaining more information on advance directives. [facility] CSW provided education on entire document, it's importance, Part A and Part B and reminded her NOT to sign document until she is able to get it notarized. Patient expressed understanding. Patient denied needing [facility] CSW to complete home visit to assist with this process. Patient reported that she is interested in possibly relocating to a different apartment as she feels that her current one is too small. Patient reminded that she must contact her Section 8 case worker and ask to be put on the wait list for relocation. Patient reports knowing this already and that she has her caseworker's contact information. Patient agreeable to [place] mailing patient a list of available Section 8 properties within the area. Patient reports that her depression "comes and goes." She shares that "it is not too bad today." [facility] CSW has strongly encouraged patient in the past to consider going to grief counseling or some type of individual counseling. Patient reports that transportation is a barrier for this. [facility] CSW informed her that she would be happy to set her up with SCAT services so that patient can get out and gain socialization as well as be able to go to counseling appointments. Patient reports only wanting to use [facility] transportation. Patient reports that she does not want to get out of  the house more than she has to because she is scared of falls and having to wait long periods of time. [facility] CSW reminded patient that her depression improved during her SNF stay because she was participating in daily socialization activities at facility and was on a consistent routine and schedule. Patient expressed understanding but confirms that she does NOT want to solve her transportation barrier in order to be able to go to counseling. At this end of the conversation patient confirmed that she also did not want to go to counseling or receive counseling. Patient reports that she will communicate with [facility] CM Services if she changes her mind but insist "that she won't." [facility] CSW will not open case at this time. [facility] CSW will mail patient requested material and will follow up with her within two-three weeks to ensure all documents were received, all questions were answered and all social work needs have been met.  Eula Fried, BSW, MSW, [email or web address] Phone: [phone] Fax: [phone]

## 2018-02-12 ENCOUNTER — Other Ambulatory Visit: Payer: Self-pay | Admitting: Pharmacist

## 2018-02-12 NOTE — Patient Outreach (Signed)
[place] [facility]) Care Management  02/12/2018  JACE DOWE Nov 17, 1950 [id number]   Received call from Rogers Blocker at [facility]. He clarified Ms. Gates's current medication list.   I have updated her Preferred Pharmacy to [facility], [address] in [place], as they will be pill packaging her chronic medications.   Catie Darnelle Maffucci, PharmD PGY2 Ambulatory Care Pharmacy Resident, [place] Network Phone: [phone]

## 2018-02-12 NOTE — Patient Outreach (Signed)
[place] [facility]) Care Management  02/12/2018  [name] 03/08/1951 [id number]   Request received from [place], Eula Fried, to mail housing resources to patient.  Resources mailed today.  Ronn Melena, BSW Social Worker [phone]

## 2018-02-12 NOTE — Patient Outreach (Signed)
[place] [facility]) Care Management  02/12/2018  FELICIE KOCHER 1950/12/25 [id number]   Received call from Rogers Blocker at [facility]. He notes that the patient's escitalopram and rosuvastatin were both recently filled at [facility] and it is too soon for him to fill. He will package aspirin, metoprolol, and levothyroxine this month, and will add escitalopram and rosuvastatin next month.   Contacted [name]. She verbalizes understanding. She notes that she does need her glucometer kit filled. [employer], left a message for them to call me back.  Catie Darnelle Maffucci, PharmD PGY2 Ambulatory Care Pharmacy Resident, [place] Network Phone: [phone]

## 2018-02-15 ENCOUNTER — Other Ambulatory Visit: Payer: Self-pay

## 2018-02-15 NOTE — Patient Outreach (Signed)
[place] [facility]) Care Management  02/15/2018  [name] 09-May-1951 [id number]   67 year old with hsitory ofPericarditis,OSA, asthma, chornic hypoxia respiratory failure, HTN, DM.  8/14-9/24[facility] skilled facility for rehabilitation. 8/18-8/21 postprandial nausea/vomiting-hospitalized and discharged to Skilled facility. 8/12-8/14 with chest pain.  7/9-7/12/70for tachycardia 6/10-6/15 with acute respiratory failure with hypoxia.  Transition of care call-RNCM called to follow up. Client is voicing no specific complaints. She reports her bath aid is there to assist with her bath.  Client states she has not received her glucose meter and that her prescriptions were transferred to [facility].  Client also expresses concern that the driver does not come to her door or call to notify her of when they have arrrived at her home. She states she has missed appointments due to this. [name] states that she has received text instead, however, she does not text or use this feature. RNCM encouraged client to go ahead and schedule her rides for next week. October 14 with Dr. Tyrone Schimke) and Octoer 16 with Dr. Paz(primary care).  Plan: RNCM will follow up with [place] Regarding procedure for picking up clients.  Thea Silversmith, RN, MSN, [place] Coordinator Cell: [phone]

## 2018-02-16 ENCOUNTER — Other Ambulatory Visit: Payer: Self-pay | Admitting: Pharmacist

## 2018-02-16 DIAGNOSIS — Z9981 Dependence on supplemental oxygen: Secondary | ICD-10-CM | POA: Diagnosis not present

## 2018-02-16 DIAGNOSIS — J9611 Chronic respiratory failure with hypoxia: Secondary | ICD-10-CM | POA: Diagnosis not present

## 2018-02-16 DIAGNOSIS — J449 Chronic obstructive pulmonary disease, unspecified: Secondary | ICD-10-CM | POA: Diagnosis not present

## 2018-02-16 DIAGNOSIS — Z87440 Personal history of urinary (tract) infections: Secondary | ICD-10-CM | POA: Diagnosis not present

## 2018-02-16 DIAGNOSIS — D631 Anemia in chronic kidney disease: Secondary | ICD-10-CM | POA: Diagnosis not present

## 2018-02-16 DIAGNOSIS — E1122 Type 2 diabetes mellitus with diabetic chronic kidney disease: Secondary | ICD-10-CM | POA: Diagnosis not present

## 2018-02-16 DIAGNOSIS — Z87891 Personal history of nicotine dependence: Secondary | ICD-10-CM | POA: Diagnosis not present

## 2018-02-16 DIAGNOSIS — E785 Hyperlipidemia, unspecified: Secondary | ICD-10-CM | POA: Diagnosis not present

## 2018-02-16 DIAGNOSIS — E1136 Type 2 diabetes mellitus with diabetic cataract: Secondary | ICD-10-CM | POA: Diagnosis not present

## 2018-02-16 DIAGNOSIS — M1991 Primary osteoarthritis, unspecified site: Secondary | ICD-10-CM | POA: Diagnosis not present

## 2018-02-16 DIAGNOSIS — K219 Gastro-esophageal reflux disease without esophagitis: Secondary | ICD-10-CM | POA: Diagnosis not present

## 2018-02-16 DIAGNOSIS — E039 Hypothyroidism, unspecified: Secondary | ICD-10-CM | POA: Diagnosis not present

## 2018-02-16 DIAGNOSIS — N183 Chronic kidney disease, stage 3 (moderate): Secondary | ICD-10-CM | POA: Diagnosis not present

## 2018-02-16 DIAGNOSIS — I509 Heart failure, unspecified: Secondary | ICD-10-CM | POA: Diagnosis not present

## 2018-02-16 DIAGNOSIS — I130 Hypertensive heart and chronic kidney disease with heart failure and stage 1 through stage 4 chronic kidney disease, or unspecified chronic kidney disease: Secondary | ICD-10-CM | POA: Diagnosis not present

## 2018-02-16 DIAGNOSIS — Z96653 Presence of artificial knee joint, bilateral: Secondary | ICD-10-CM | POA: Diagnosis not present

## 2018-02-16 DIAGNOSIS — Z7951 Long term (current) use of inhaled steroids: Secondary | ICD-10-CM | POA: Diagnosis not present

## 2018-02-16 DIAGNOSIS — R1319 Other dysphagia: Secondary | ICD-10-CM | POA: Diagnosis not present

## 2018-02-16 DIAGNOSIS — Z7982 Long term (current) use of aspirin: Secondary | ICD-10-CM | POA: Diagnosis not present

## 2018-02-16 DIAGNOSIS — K5792 Diverticulitis of intestine, part unspecified, without perforation or abscess without bleeding: Secondary | ICD-10-CM | POA: Diagnosis not present

## 2018-02-16 DIAGNOSIS — G4733 Obstructive sleep apnea (adult) (pediatric): Secondary | ICD-10-CM | POA: Diagnosis not present

## 2018-02-16 NOTE — Patient Outreach (Signed)
[place] [facility]) Care Management  02/16/2018  [name] 1950-11-28 [id number]  [place] stated that a glucometer (Accu Chek Aviva Plus) was filled and delivered to the patient yesterday.   Contacted patient. Reiterated that she will need to take escitalopram and rosuvastatin from the [facility] bottles for the rest of this month in conjunction with the pill packs until [facility] can fill the escitalopram and rosuvastatin in the pill packs.   Patient states that she did not receive strips with the glucometer. Programmer, multimedia. Christy Sartorius will fill and deliver strips and lancets today.   Contacted patient to inform. She said that she still needs to find the phone number for her transportation, and that her son is going to come over later and help her find that. I encouraged her to tell him to give me a call if he has any questions about the pill packs.   Plan:  - Will follow up by telephone next week   Catie Darnelle Maffucci, PharmD PGY2 Ambulatory Care Pharmacy Resident, [place] Phone: [phone]

## 2018-02-17 ENCOUNTER — Other Ambulatory Visit: Payer: Self-pay

## 2018-02-17 ENCOUNTER — Ambulatory Visit: Payer: Self-pay | Admitting: Pharmacist

## 2018-02-17 ENCOUNTER — Telehealth: Payer: Self-pay | Admitting: Gastroenterology

## 2018-02-17 DIAGNOSIS — J9611 Chronic respiratory failure with hypoxia: Secondary | ICD-10-CM | POA: Diagnosis not present

## 2018-02-17 DIAGNOSIS — I2510 Atherosclerotic heart disease of native coronary artery without angina pectoris: Secondary | ICD-10-CM | POA: Diagnosis not present

## 2018-02-17 DIAGNOSIS — I509 Heart failure, unspecified: Secondary | ICD-10-CM | POA: Diagnosis not present

## 2018-02-17 NOTE — Patient Outreach (Signed)
[place] [facility]) Care Management  02/17/2018  [name] [birth year] [id number]   Care Coordination-RNCM called and spoke with social work, Jettie Booze regarding client's transportation concerns. Per her recommendation, referral placed to social work(BSW) to assist with client's transportation issues/concerns.  Thea Silversmith, RN, MSN, [place] Coordinator Cell: [phone]

## 2018-02-17 NOTE — Telephone Encounter (Signed)
No answer and no voice mail.

## 2018-02-18 DIAGNOSIS — Z87891 Personal history of nicotine dependence: Secondary | ICD-10-CM | POA: Diagnosis not present

## 2018-02-18 DIAGNOSIS — Z7951 Long term (current) use of inhaled steroids: Secondary | ICD-10-CM | POA: Diagnosis not present

## 2018-02-18 DIAGNOSIS — Z7982 Long term (current) use of aspirin: Secondary | ICD-10-CM | POA: Diagnosis not present

## 2018-02-18 DIAGNOSIS — K219 Gastro-esophageal reflux disease without esophagitis: Secondary | ICD-10-CM | POA: Diagnosis not present

## 2018-02-18 DIAGNOSIS — G4733 Obstructive sleep apnea (adult) (pediatric): Secondary | ICD-10-CM | POA: Diagnosis not present

## 2018-02-18 DIAGNOSIS — M1991 Primary osteoarthritis, unspecified site: Secondary | ICD-10-CM | POA: Diagnosis not present

## 2018-02-18 DIAGNOSIS — E785 Hyperlipidemia, unspecified: Secondary | ICD-10-CM | POA: Diagnosis not present

## 2018-02-18 DIAGNOSIS — K5792 Diverticulitis of intestine, part unspecified, without perforation or abscess without bleeding: Secondary | ICD-10-CM | POA: Diagnosis not present

## 2018-02-18 DIAGNOSIS — E1122 Type 2 diabetes mellitus with diabetic chronic kidney disease: Secondary | ICD-10-CM | POA: Diagnosis not present

## 2018-02-18 DIAGNOSIS — E1136 Type 2 diabetes mellitus with diabetic cataract: Secondary | ICD-10-CM | POA: Diagnosis not present

## 2018-02-18 DIAGNOSIS — E039 Hypothyroidism, unspecified: Secondary | ICD-10-CM | POA: Diagnosis not present

## 2018-02-18 DIAGNOSIS — J9611 Chronic respiratory failure with hypoxia: Secondary | ICD-10-CM | POA: Diagnosis not present

## 2018-02-18 DIAGNOSIS — Z9981 Dependence on supplemental oxygen: Secondary | ICD-10-CM | POA: Diagnosis not present

## 2018-02-18 DIAGNOSIS — Z96653 Presence of artificial knee joint, bilateral: Secondary | ICD-10-CM | POA: Diagnosis not present

## 2018-02-18 DIAGNOSIS — N183 Chronic kidney disease, stage 3 (moderate): Secondary | ICD-10-CM | POA: Diagnosis not present

## 2018-02-18 DIAGNOSIS — R1319 Other dysphagia: Secondary | ICD-10-CM | POA: Diagnosis not present

## 2018-02-18 DIAGNOSIS — D631 Anemia in chronic kidney disease: Secondary | ICD-10-CM | POA: Diagnosis not present

## 2018-02-18 DIAGNOSIS — I130 Hypertensive heart and chronic kidney disease with heart failure and stage 1 through stage 4 chronic kidney disease, or unspecified chronic kidney disease: Secondary | ICD-10-CM | POA: Diagnosis not present

## 2018-02-18 DIAGNOSIS — Z87440 Personal history of urinary (tract) infections: Secondary | ICD-10-CM | POA: Diagnosis not present

## 2018-02-18 DIAGNOSIS — I509 Heart failure, unspecified: Secondary | ICD-10-CM | POA: Diagnosis not present

## 2018-02-18 DIAGNOSIS — J449 Chronic obstructive pulmonary disease, unspecified: Secondary | ICD-10-CM | POA: Diagnosis not present

## 2018-02-18 NOTE — Telephone Encounter (Signed)
Plan for H pylori stool testing. Need to be off protonix for 3 weeks then have test done.  The pt has further questions about how to collect the sample.  I gave her the number to the lab so that could be explained to her.  [phone]

## 2018-02-19 ENCOUNTER — Ambulatory Visit: Payer: Self-pay | Admitting: Gastroenterology

## 2018-02-19 ENCOUNTER — Other Ambulatory Visit: Payer: Self-pay

## 2018-02-19 DIAGNOSIS — R1319 Other dysphagia: Secondary | ICD-10-CM | POA: Diagnosis not present

## 2018-02-19 DIAGNOSIS — N183 Chronic kidney disease, stage 3 (moderate): Secondary | ICD-10-CM | POA: Diagnosis not present

## 2018-02-19 DIAGNOSIS — J9611 Chronic respiratory failure with hypoxia: Secondary | ICD-10-CM | POA: Diagnosis not present

## 2018-02-19 DIAGNOSIS — E785 Hyperlipidemia, unspecified: Secondary | ICD-10-CM | POA: Diagnosis not present

## 2018-02-19 DIAGNOSIS — E1122 Type 2 diabetes mellitus with diabetic chronic kidney disease: Secondary | ICD-10-CM | POA: Diagnosis not present

## 2018-02-19 DIAGNOSIS — Z87440 Personal history of urinary (tract) infections: Secondary | ICD-10-CM | POA: Diagnosis not present

## 2018-02-19 DIAGNOSIS — E1136 Type 2 diabetes mellitus with diabetic cataract: Secondary | ICD-10-CM | POA: Diagnosis not present

## 2018-02-19 DIAGNOSIS — Z7951 Long term (current) use of inhaled steroids: Secondary | ICD-10-CM | POA: Diagnosis not present

## 2018-02-19 DIAGNOSIS — Z7982 Long term (current) use of aspirin: Secondary | ICD-10-CM | POA: Diagnosis not present

## 2018-02-19 DIAGNOSIS — K219 Gastro-esophageal reflux disease without esophagitis: Secondary | ICD-10-CM | POA: Diagnosis not present

## 2018-02-19 DIAGNOSIS — K5792 Diverticulitis of intestine, part unspecified, without perforation or abscess without bleeding: Secondary | ICD-10-CM | POA: Diagnosis not present

## 2018-02-19 DIAGNOSIS — G4733 Obstructive sleep apnea (adult) (pediatric): Secondary | ICD-10-CM | POA: Diagnosis not present

## 2018-02-19 DIAGNOSIS — J449 Chronic obstructive pulmonary disease, unspecified: Secondary | ICD-10-CM | POA: Diagnosis not present

## 2018-02-19 DIAGNOSIS — I509 Heart failure, unspecified: Secondary | ICD-10-CM | POA: Diagnosis not present

## 2018-02-19 DIAGNOSIS — Z9981 Dependence on supplemental oxygen: Secondary | ICD-10-CM | POA: Diagnosis not present

## 2018-02-19 DIAGNOSIS — M1991 Primary osteoarthritis, unspecified site: Secondary | ICD-10-CM | POA: Diagnosis not present

## 2018-02-19 DIAGNOSIS — E039 Hypothyroidism, unspecified: Secondary | ICD-10-CM | POA: Diagnosis not present

## 2018-02-19 DIAGNOSIS — Z87891 Personal history of nicotine dependence: Secondary | ICD-10-CM | POA: Diagnosis not present

## 2018-02-19 DIAGNOSIS — D631 Anemia in chronic kidney disease: Secondary | ICD-10-CM | POA: Diagnosis not present

## 2018-02-19 DIAGNOSIS — I130 Hypertensive heart and chronic kidney disease with heart failure and stage 1 through stage 4 chronic kidney disease, or unspecified chronic kidney disease: Secondary | ICD-10-CM | POA: Diagnosis not present

## 2018-02-19 DIAGNOSIS — Z96653 Presence of artificial knee joint, bilateral: Secondary | ICD-10-CM | POA: Diagnosis not present

## 2018-02-19 NOTE — Patient Outreach (Signed)
[place] [facility]) Care Management  02/19/2018  [name] 1950-12-30 [id number]   Outreach to [name] regarding referral for transportation assistance.  [name] has been utilizing [employer] for transportation but reported that she has missed a couple of appointments because the driver did not call her or come to the door upon arrival. She reported that the driver would send her a text but she does not text.   When contacted today [name] reported that she had already been in contact with someone at [facility] that reported the drivers are supposed to call and/or go to the door.  She was assured that the drivers will no longer text her.  [name] said that she has transportation arranged for upcoming appointments.  BSW is signing off at this time but encouraged [name] to call if she has any other difficulty with transportation.  CSW is currently still on care team.  Ronn Melena, [place] Social Worker [phone]

## 2018-02-23 ENCOUNTER — Other Ambulatory Visit: Payer: Self-pay | Admitting: Licensed Clinical Social Worker

## 2018-02-23 ENCOUNTER — Encounter: Payer: Self-pay | Admitting: Internal Medicine

## 2018-02-23 ENCOUNTER — Other Ambulatory Visit: Payer: Self-pay | Admitting: Pharmacist

## 2018-02-23 NOTE — Patient Outreach (Signed)
[facility] [facility]) Care Management  02/23/2018  [name] 12/12/1950 [id number]   Contacted patient to f/u on using the pill packs. HIPAA verifiers identified.   She noted that she likes having the pill packs. Reiterated that she needs to continue the escitalopram and rosuvastatin from the [facility] bottles until her next month of pill packs. At that time, [facility] will be able to fill these two medications in the pill packs with her other chronic medications.   Patient declined any further questions or needs at this time. Will close pharmacy case d/t goals of care being met.   Catie Darnelle Maffucci, PharmD PGY2 Ambulatory Care Pharmacy Resident, [place] Network Phone: [phone]

## 2018-02-23 NOTE — Patient Outreach (Signed)
[place] [facility]) Care Management  02/23/2018  DORE OQUIN [birth year] [id number]  [facility] CSW completed outreach call to patient and patient successfully answered call and provided HIPPA verifications. Patient shares that she successfully received mailed community resources and advance directives in the mail that [facility] CSW sent. [facility] CSW completed review of documents and provided education on the completion process of advance directives again. Patient appreciative of education and social work assistance provided. Patient denies any further social work needs at this time. [facility] CSW will sign off but encouraged patient to contact [facility] CSW in the future if any social work needs were to arise.  Eula Fried, BSW, MSW, [email or web address] Phone: [phone] Fax: [phone]

## 2018-02-24 ENCOUNTER — Encounter: Payer: Self-pay | Admitting: Internal Medicine

## 2018-02-24 ENCOUNTER — Ambulatory Visit (INDEPENDENT_AMBULATORY_CARE_PROVIDER_SITE_OTHER): Payer: Medicare Other | Admitting: Internal Medicine

## 2018-02-24 VITALS — BP 142/80 | HR 88 | Temp 98.1°F | Resp 16 | Ht 69.0 in

## 2018-02-24 DIAGNOSIS — Z23 Encounter for immunization: Secondary | ICD-10-CM

## 2018-02-24 DIAGNOSIS — I10 Essential (primary) hypertension: Secondary | ICD-10-CM

## 2018-02-24 DIAGNOSIS — F329 Major depressive disorder, single episode, unspecified: Secondary | ICD-10-CM

## 2018-02-24 DIAGNOSIS — E1042 Type 1 diabetes mellitus with diabetic polyneuropathy: Secondary | ICD-10-CM

## 2018-02-24 DIAGNOSIS — E876 Hypokalemia: Secondary | ICD-10-CM

## 2018-02-24 DIAGNOSIS — I313 Pericardial effusion (noninflammatory): Secondary | ICD-10-CM

## 2018-02-24 DIAGNOSIS — E039 Hypothyroidism, unspecified: Secondary | ICD-10-CM | POA: Diagnosis not present

## 2018-02-24 DIAGNOSIS — F32A Depression, unspecified: Secondary | ICD-10-CM

## 2018-02-24 DIAGNOSIS — I3139 Other pericardial effusion (noninflammatory): Secondary | ICD-10-CM

## 2018-02-24 NOTE — Progress Notes (Signed)
Pre visit review using our clinic review tool, if applicable. No additional management support is needed unless otherwise documented below in the visit note. 

## 2018-02-24 NOTE — Patient Instructions (Signed)
GO TO THE LAB : Get the blood work     GO TO THE FRONT DESK Schedule your next appointment for a  checkup in 3 months  

## 2018-02-24 NOTE — Progress Notes (Signed)
Subjective:    Patient ID: [name], female    DOB: 1950-07-29, 67 y.o.   MRN: [id number]  DOS:  02/24/2018 Type of visit - description : rov Interval history: Since the last office visit, she has been admitted to the hospital 3 times, the last one was 12/27/2016 to 12/30/2016. At the time she had chronic nausea, vomiting, abdominal pain. She had extensive GI evaluation.  + H. pylori on biopsy 12/28/2017. Subsequently, was seen by GI 01/29/2018, at the time she was noted to be doing better regarding postprandial abdominal discomfort with nausea. They recommended to hold PPIs. Dysphagia was felt to be due to a tortuous esophagus and/or dysmotility. They recommended a colonoscopy in 2023 if  a candidate   Review of Systems After the last admission, she was discharged to a rehab place, went home 02/02/2018. Since then, she is tolerating foods better. Still have some nausea but is mild.  No vomiting, diarrhea, denies blood in the stools. Still "hurting a little" at the stomach.   Past Medical History:  Diagnosis Date  . Anterolisthesis    Grade 1, L4-5  . Bronchospasm 05/28/2012  . CHEST PAIN 11/18/2007  . DEGENERATIVE JOINT DISEASE 10/06/2006  . DEPRESSION 09/26/2008  . DIABETES MELLITUS 10/06/2006  . Diverticulosis    [id number],[id number]  . GERD (gastroesophageal reflux disease) 07/25/2013  . HYPERLIPIDEMIA 01/11/2009  . HYPERTENSION 10/06/2006  . INSOMNIA 09/26/2008  . Internal hemorrhoids   . OBSTRUCTIVE SLEEP APNEA 06/23/2008   Severe OSA per sleep study 2010, Rx a CPAP  . Pain in joint, multiple sites 11/10/2006  . UNSPECIFIED ANEMIA 12/10/2009  . UTI (urinary tract infection) 11/2017    Past Surgical History:  Procedure Laterality Date  . BIOPSY  12/28/2017   Procedure: BIOPSY;  Surgeon: Irving Copas., MD;  Location: [facility];  Service: Gastroenterology;;  . COLONOSCOPY  08/01/2011   Procedure: COLONOSCOPY;  Surgeon: Inda Castle, MD;  Location: [facility];   Service: Endoscopy;  Laterality: N/A;  . ESOPHAGOGASTRODUODENOSCOPY (EGD) WITH PROPOFOL N/A 12/28/2017   Procedure: ESOPHAGOGASTRODUODENOSCOPY (EGD) WITH PROPOFOL;  Surgeon: Rush Landmark Telford Nab., MD;  Location: [facility];  Service: Gastroenterology;  Laterality: N/A;  . LEFT HEART CATH AND CORONARY ANGIOGRAPHY N/A 10/22/2017   Procedure: LEFT HEART CATH AND CORONARY ANGIOGRAPHY;  Surgeon: Jettie Booze, MD;  Location: [facility] CV LAB;  Service: Cardiovascular;  Laterality: N/A;  . 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 Right knee replacement  2005    Social History   Socioeconomic History  . Marital status: Widowed    Spouse name: Not on file  . Number of children: 4  . Years of education: Not on file  . Highest education level: Not on file  Occupational History  . Occupation: disability    Employer: UNEMPLOYED  Social Needs  . Financial resource strain: Not on file  . Food insecurity:    Worry: Not on file    Inability: Not on file  . Transportation needs:    Medical: Not on file    Non-medical: Not on file  Tobacco Use  . Smoking status: Former Smoker    Packs/day: 0.20    Years: 4.00    Pack years: 0.80    Types: Cigarettes    Last attempt to quit: 05/13/1995    Years since quitting: 22.8  . Smokeless tobacco: Never Used  Substance and Sexual Activity  . Alcohol use: Not Currently  . Drug use: No  . Sexual activity: Not  Currently  Lifestyle  . Physical activity:    Days per week: Not on file    Minutes per session: Not on file  . Stress: Not on file  Relationships  . Social connections:    Talks on phone: Not on file    Gets together: Not on file    Attends religious service: Not on file    Active member of club or organization: Not on file    Attends meetings of clubs or organizations: Not on file    Relationship status: Not on file  . Intimate partner violence:    Fear of current or ex partner: Not on file    Emotionally abused: Not on file     Physically abused: Not on file    Forced sexual activity: Not on file  Other Topics Concern  . Not on file  Social History Narrative   Widow , lives by herself   Lost a son, 3 living    Lost husband      Allergies as of 02/24/2018   No Known Allergies     Medication List        Accurate as of 02/24/18 11:59 PM. Always use your most recent med list.          ACCU-CHEK AVIVA device Use as instructed to check blood sugar twice daily dx E11.9   ACCU-CHEK SOFTCLIX LANCETS lancets USE TO CHECK BLOOD SUGAR TWICE A DAY E11.9   acetaminophen 325 MG tablet Commonly known as:  TYLENOL Take 2 tablets (650 mg total) by mouth every 6 (six) hours as needed for mild pain (or Fever >/= 101).   albuterol 108 (90 Base) MCG/ACT inhaler Commonly known as:  PROVENTIL HFA;VENTOLIN HFA Inhale 2 puffs into the lungs every 4 (four) hours as needed for wheezing.   aspirin EC 81 MG tablet Take 1 tablet (81 mg total) by mouth daily. With food   escitalopram 10 MG tablet Commonly known as:  LEXAPRO Take 1 tablet (10 mg total) by mouth daily.   famotidine 20 MG tablet Commonly known as:  PEPCID Take 1 tablet (20 mg total) by mouth daily as needed for heartburn or indigestion.   Fluticasone-Salmeterol 250-50 MCG/DOSE Aepb Commonly known as:  ADVAIR Inhale 2 puffs into the lungs daily.   glucose blood test strip TEST BLOD SUGAR TWICE DAILY AND LANCETS TWICE DAILY E11.9   latanoprost 0.005 % ophthalmic solution Commonly known as:  XALATAN USE 1 DROP IN BOTH EYES AT BEDTIME   levothyroxine 75 MCG tablet Commonly known as:  SYNTHROID, LEVOTHROID Take 1 tablet (75 mcg total) by mouth daily before breakfast.   Melatonin 3 MG Tabs Take 1-2 tablets (3-6 mg total) by mouth at bedtime as needed (insomnia).   metoprolol tartrate 25 MG tablet Commonly known as:  LOPRESSOR Take 37.5 mg by mouth 2 (two) times daily. (1.5 tablets twice a day)   ondansetron 4 MG disintegrating tablet Commonly  known as:  ZOFRAN-ODT Take 1 tablet (4 mg total) by mouth every 8 (eight) hours as needed for nausea or vomiting.   OVER THE COUNTER MEDICATION CPAP   OXYGEN Inhale 2 L into the lungs continuous.   pantoprazole 40 MG tablet Commonly known as:  PROTONIX Take 1 tablet (40 mg total) by mouth 2 (two) times daily.   rosuvastatin 40 MG tablet Commonly known as:  CRESTOR Take 1 tablet (40 mg total) by mouth daily.          Objective:   Physical Exam  BP (!) 142/80 (BP Location: Left Arm, Patient Position: Sitting, Cuff Size: Small)   Pulse 88   Temp 98.1 F (36.7 C) (Oral)   Resp 16   Ht 5\' 9"  (1.753 m)   SpO2 98%   BMI 32.02 kg/m  General:   Well developed, NAD, see BMI.  HEENT:  Normocephalic . Face symmetric, atraumatic Lungs:  CTA B Normal respiratory effort, no intercostal retractions, no accessory muscle use. Heart: RRR,  no murmur.  no pretibial edema bilaterally  Abdomen:  Not distended, soft, mild upper abdominal tenderness noted without mass or rebound.exam performed while pt ina wheelchair Skin: Not pale. Not jaundice Neurologic:  alert & oriented X3.  Speech normal, gait not tested Psych--  Cognition and judgment appear intact.  Cooperative with normal attention span and concentration.  Behavior appropriate. No anxious or depressed appearing.     Assessment & Plan:    Assessment DM per endocrinology HTN Hyperlipidemia Depression, insomnia (citalopram caused headache ? 05-2014) MSK: --DJD, saw ortho ~06-2014 --Multiple joint pains, 2008, sedimentation rate, RA >> negative --Back pain>> -- MRI 2007: Epidural lipomatosis, congenitally short pedicles, spondylosis, and disc bulges and protrusions contribute to central, foraminal, and subarticular lateral recess stenoses as detailed above.  --2008, saw Ortho, Rx local injections  --local injections back Dr Mina Marble  ~ 12-2015 GI --GERD --Persisting nausea, vomiting, weight loss: Extensive work-up ~  12/2017. --Colonoscopy due 2023 Morbid obesity Pulmonary -Chronic respiratory failure  -asthma -OSA dx 2010, severe, nocturnal desaturation, intolerant to CPAP, on nocturnal O2 -ONO 01/2017:  + Hypoxia at night, rec  to continue oxygen at night unless able  to tolerate a CPAP   DOE, chronic CP, chronic RUQ pain:: 2004--Cath neg, areterial LE dopplers (-) 06-2003-- abd. u/s fatty liver 01-2005-- CP...neg u/s GB, neg HIDA 10-06--saw cards--cardiolite neg 12-2007 abnormal stress test Cath 02-01-08: normal coronaries  chest pain, stress test echo okay 09/2016 SOB, admitted , cath 10/2017: no CAD  PLAN: DM: Currently not taking any medication, food intake has been very low in the last few months.  Will check a A1c   HTN: Currently on Lopressor 25 mg, 1.5 tablets twice a day. High cholesterol: On Crestor Hypothyroidism: Last TSH elevated, recheck today, reports good compliance with medication. Depression: Reports good compliance with Lexapro Persistent nausea, vomiting, weight loss: Recently had extensive work-up by GI, symptoms have decreased significantly, she has mild nausea occasionally, on exam abdomen is only slightly tender.  We are checking a CBC. Was rec to hold PPI in preparation to recheck for H pylori Pericardial effusion: Last visit with cardiology 12/18/2017, she had colchicine for 3 months and that has been stopped. Preventive care: Very reluctant to take a flu shot, we discussed pros and cons, eventually agreed to proceed. RTC 3 months

## 2018-02-25 ENCOUNTER — Telehealth: Payer: Self-pay | Admitting: *Deleted

## 2018-02-25 ENCOUNTER — Other Ambulatory Visit: Payer: Self-pay | Admitting: *Deleted

## 2018-02-25 DIAGNOSIS — E1136 Type 2 diabetes mellitus with diabetic cataract: Secondary | ICD-10-CM

## 2018-02-25 DIAGNOSIS — Z9981 Dependence on supplemental oxygen: Secondary | ICD-10-CM

## 2018-02-25 DIAGNOSIS — N183 Chronic kidney disease, stage 3 (moderate): Secondary | ICD-10-CM | POA: Diagnosis not present

## 2018-02-25 DIAGNOSIS — I509 Heart failure, unspecified: Secondary | ICD-10-CM | POA: Diagnosis not present

## 2018-02-25 DIAGNOSIS — M1991 Primary osteoarthritis, unspecified site: Secondary | ICD-10-CM | POA: Diagnosis not present

## 2018-02-25 DIAGNOSIS — E1122 Type 2 diabetes mellitus with diabetic chronic kidney disease: Secondary | ICD-10-CM | POA: Diagnosis not present

## 2018-02-25 DIAGNOSIS — Z7951 Long term (current) use of inhaled steroids: Secondary | ICD-10-CM

## 2018-02-25 DIAGNOSIS — R1319 Other dysphagia: Secondary | ICD-10-CM | POA: Diagnosis not present

## 2018-02-25 DIAGNOSIS — J449 Chronic obstructive pulmonary disease, unspecified: Secondary | ICD-10-CM

## 2018-02-25 DIAGNOSIS — J9611 Chronic respiratory failure with hypoxia: Secondary | ICD-10-CM | POA: Diagnosis not present

## 2018-02-25 DIAGNOSIS — Z87440 Personal history of urinary (tract) infections: Secondary | ICD-10-CM | POA: Diagnosis not present

## 2018-02-25 DIAGNOSIS — Z7982 Long term (current) use of aspirin: Secondary | ICD-10-CM | POA: Diagnosis not present

## 2018-02-25 DIAGNOSIS — F339 Major depressive disorder, recurrent, unspecified: Secondary | ICD-10-CM

## 2018-02-25 DIAGNOSIS — Z6833 Body mass index (BMI) 33.0-33.9, adult: Secondary | ICD-10-CM

## 2018-02-25 DIAGNOSIS — E039 Hypothyroidism, unspecified: Secondary | ICD-10-CM

## 2018-02-25 DIAGNOSIS — Z87891 Personal history of nicotine dependence: Secondary | ICD-10-CM | POA: Diagnosis not present

## 2018-02-25 DIAGNOSIS — E785 Hyperlipidemia, unspecified: Secondary | ICD-10-CM

## 2018-02-25 DIAGNOSIS — K219 Gastro-esophageal reflux disease without esophagitis: Secondary | ICD-10-CM | POA: Diagnosis not present

## 2018-02-25 DIAGNOSIS — G4733 Obstructive sleep apnea (adult) (pediatric): Secondary | ICD-10-CM

## 2018-02-25 DIAGNOSIS — Z96653 Presence of artificial knee joint, bilateral: Secondary | ICD-10-CM

## 2018-02-25 DIAGNOSIS — K5792 Diverticulitis of intestine, part unspecified, without perforation or abscess without bleeding: Secondary | ICD-10-CM | POA: Diagnosis not present

## 2018-02-25 DIAGNOSIS — Z9181 History of falling: Secondary | ICD-10-CM

## 2018-02-25 DIAGNOSIS — D631 Anemia in chronic kidney disease: Secondary | ICD-10-CM

## 2018-02-25 DIAGNOSIS — I130 Hypertensive heart and chronic kidney disease with heart failure and stage 1 through stage 4 chronic kidney disease, or unspecified chronic kidney disease: Secondary | ICD-10-CM | POA: Diagnosis not present

## 2018-02-25 LAB — COMPREHENSIVE METABOLIC PANEL
ALT: 6 U/L (ref 0–35)
AST: 15 U/L (ref 0–37)
Albumin: 3.1 g/dL — ABNORMAL LOW (ref 3.5–5.2)
Alkaline Phosphatase: 58 U/L (ref 39–117)
BUN: 8 mg/dL (ref 6–23)
CALCIUM: 8.9 mg/dL (ref 8.4–10.5)
CHLORIDE: 98 meq/L (ref 96–112)
CO2: 30 meq/L (ref 19–32)
Creatinine, Ser: 0.83 mg/dL (ref 0.40–1.20)
GFR: 87.69 mL/min (ref >60.00)
Glucose, Bld: 129 mg/dL — ABNORMAL HIGH (ref 70–99)
POTASSIUM: 3.1 meq/L — AB (ref 3.5–5.1)
Sodium: 137 mEq/L (ref 135–145)
Total Bilirubin: 0.7 mg/dL (ref 0.2–1.2)
Total Protein: 7.6 g/dL (ref 6.0–8.3)

## 2018-02-25 LAB — CBC WITH DIFFERENTIAL/PLATELET
BASOS PCT: 1.2 % (ref 0.0–3.0)
Basophils Absolute: 0 10*3/uL (ref 0.0–0.1)
Eosinophils Absolute: 0 10*3/uL (ref 0.0–0.7)
Eosinophils Relative: 0.7 % (ref 0.0–5.0)
HEMATOCRIT: 30.1 % — AB (ref 36.0–46.0)
HEMOGLOBIN: 9.7 g/dL — AB (ref 12.0–15.0)
LYMPHS PCT: 16.2 % (ref 12.0–46.0)
Lymphs Abs: 0.7 10*3/uL (ref 0.7–4.0)
MCHC: 32.2 g/dL (ref 30.0–36.0)
MCV: 79.4 fl (ref 78.0–100.0)
MONOS PCT: 14.1 % — AB (ref 3.0–12.0)
Monocytes Absolute: 0.6 10*3/uL (ref 0.1–1.0)
NEUTROS ABS: 2.8 10*3/uL (ref 1.4–7.7)
Neutrophils Relative %: 67.8 % (ref 43.0–77.0)
PLATELETS: 360 10*3/uL (ref 150.0–400.0)
RBC: 3.79 Mil/uL — ABNORMAL LOW (ref 3.87–5.11)
RDW: 21 % — AB (ref 11.5–15.5)
WBC: 4.1 10*3/uL (ref 4.0–10.5)

## 2018-02-25 LAB — HEMOGLOBIN A1C: Hgb A1c MFr Bld: 5.8 % (ref 4.6–6.5)

## 2018-02-25 LAB — TSH: TSH: 1.97 u[IU]/mL (ref 0.35–4.50)

## 2018-02-25 NOTE — Assessment & Plan Note (Signed)
DM: Currently not taking any medication, food intake has been very low in the last few months.  Will check a A1c   HTN: Currently on Lopressor 25 mg, 1.5 tablets twice a day. High cholesterol: On Crestor Hypothyroidism: Last TSH elevated, recheck today, reports good compliance with medication. Depression: Reports good compliance with Lexapro Persistent nausea, vomiting, weight loss: Recently had extensive work-up by GI, symptoms have decreased significantly, she has mild nausea occasionally, on exam abdomen is only slightly tender.  We are checking a CBC. Was rec to hold PPI in preparation to recheck for H pylori Pericardial effusion: Last visit with cardiology 12/18/2017, she had colchicine for 3 months and that has been stopped. Preventive care: Very reluctant to take a flu shot, we discussed pros and cons, eventually agreed to proceed. RTC 3 months

## 2018-02-25 NOTE — Telephone Encounter (Signed)
Received Home Health Certification and Plan of Care; forwarded to provider/SLS 10/17  

## 2018-02-25 NOTE — Patient Outreach (Signed)
[place] [facility]) Care Management  02/25/2018  AMAURI MEDELLIN 08/13/1950 [id number]   Call placed to member for transition of care, she report she is doing well, denies pain or discomfort.  Confirms she attended MD visit yesterday, denies any urgent concerns, next follow up in 3 months.  A1C decreased to 5.8, blood pressure was noted to be decreased from previous visits.  Denies any questions at this time, will update assigned care manager to follow up next week.  [facility] CM Care Plan Problem One     Most Recent Value  Care Plan Problem One  at risk for readmission as evidence by recent admission/skilled facility stay.  Role Documenting the Problem One  Care Management [place] for Problem One  Active  [facility] Long Term Goal   client will not be readmitted within the next 31 days.  [facility] Long Term Goal Start Date  02/05/18  Interventions for Problem One Long Term Goal  Patient educated on importance of following plan of care, diet, medications, MD visits in effort to decrease risk of readmission  [facility] CM Short Term Goal #2   client will take medications as prescribed within the next 30 days.  [facility] CM Short Term Goal #2 Start Date  02/05/18  [facility] CM Short Term Goal #2 Met Date  02/25/18    [facility] CM Care Plan Problem Two     Most Recent Value  Care Plan Problem Two  increased blood pressure as evidence of BP greater than 140/90  Role Documenting the Problem Two  Care Management [place] for Problem Two  Active  [facility] CM Short Term Goal #1   client will verbalize blood pressure within normal limits within the next 30 days.   [facility] CM Short Term Goal #1 Start Date  02/10/18  Interventions for Short Term Goal #2   Reviewed importance of daily monitoring of blood pressure in effort to manage medication changes effectively     Valente David, RN, MSN [place] [phone]

## 2018-02-26 ENCOUNTER — Telehealth: Payer: Self-pay | Admitting: Internal Medicine

## 2018-02-26 DIAGNOSIS — K5792 Diverticulitis of intestine, part unspecified, without perforation or abscess without bleeding: Secondary | ICD-10-CM | POA: Diagnosis not present

## 2018-02-26 DIAGNOSIS — D631 Anemia in chronic kidney disease: Secondary | ICD-10-CM | POA: Diagnosis not present

## 2018-02-26 DIAGNOSIS — E1122 Type 2 diabetes mellitus with diabetic chronic kidney disease: Secondary | ICD-10-CM | POA: Diagnosis not present

## 2018-02-26 DIAGNOSIS — J449 Chronic obstructive pulmonary disease, unspecified: Secondary | ICD-10-CM | POA: Diagnosis not present

## 2018-02-26 DIAGNOSIS — Z96653 Presence of artificial knee joint, bilateral: Secondary | ICD-10-CM | POA: Diagnosis not present

## 2018-02-26 DIAGNOSIS — I130 Hypertensive heart and chronic kidney disease with heart failure and stage 1 through stage 4 chronic kidney disease, or unspecified chronic kidney disease: Secondary | ICD-10-CM | POA: Diagnosis not present

## 2018-02-26 DIAGNOSIS — Z9981 Dependence on supplemental oxygen: Secondary | ICD-10-CM | POA: Diagnosis not present

## 2018-02-26 DIAGNOSIS — N183 Chronic kidney disease, stage 3 (moderate): Secondary | ICD-10-CM | POA: Diagnosis not present

## 2018-02-26 DIAGNOSIS — Z87891 Personal history of nicotine dependence: Secondary | ICD-10-CM | POA: Diagnosis not present

## 2018-02-26 DIAGNOSIS — Z7951 Long term (current) use of inhaled steroids: Secondary | ICD-10-CM | POA: Diagnosis not present

## 2018-02-26 DIAGNOSIS — R1319 Other dysphagia: Secondary | ICD-10-CM | POA: Diagnosis not present

## 2018-02-26 DIAGNOSIS — G4733 Obstructive sleep apnea (adult) (pediatric): Secondary | ICD-10-CM | POA: Diagnosis not present

## 2018-02-26 DIAGNOSIS — M1991 Primary osteoarthritis, unspecified site: Secondary | ICD-10-CM | POA: Diagnosis not present

## 2018-02-26 DIAGNOSIS — J9611 Chronic respiratory failure with hypoxia: Secondary | ICD-10-CM | POA: Diagnosis not present

## 2018-02-26 DIAGNOSIS — I509 Heart failure, unspecified: Secondary | ICD-10-CM | POA: Diagnosis not present

## 2018-02-26 DIAGNOSIS — E039 Hypothyroidism, unspecified: Secondary | ICD-10-CM | POA: Diagnosis not present

## 2018-02-26 DIAGNOSIS — E1136 Type 2 diabetes mellitus with diabetic cataract: Secondary | ICD-10-CM | POA: Diagnosis not present

## 2018-02-26 DIAGNOSIS — Z87440 Personal history of urinary (tract) infections: Secondary | ICD-10-CM | POA: Diagnosis not present

## 2018-02-26 DIAGNOSIS — E785 Hyperlipidemia, unspecified: Secondary | ICD-10-CM | POA: Diagnosis not present

## 2018-02-26 DIAGNOSIS — K219 Gastro-esophageal reflux disease without esophagitis: Secondary | ICD-10-CM | POA: Diagnosis not present

## 2018-02-26 DIAGNOSIS — Z7982 Long term (current) use of aspirin: Secondary | ICD-10-CM | POA: Diagnosis not present

## 2018-02-26 MED ORDER — POTASSIUM CHLORIDE ER 10 MEQ PO TBCR: 10.0000 meq | EXTENDED_RELEASE_TABLET | Freq: Every day | ORAL | 0 refills | Status: DC

## 2018-02-26 NOTE — Addendum Note (Signed)
Addended byDamita Dunnings D on: 02/26/2018 01:09 PM   Modules accepted: Orders

## 2018-02-26 NOTE — Telephone Encounter (Signed)
LMOM w/ verbal orders.  

## 2018-02-26 NOTE — Telephone Encounter (Signed)
Copied from [place] [phone]. Topic: Quick Communication - See Telephone Encounter >> Feb 26, 2018  9:00 AM Robina Ade, Helene Kelp D wrote: CRM for notification. See Telephone encounter for: 02/26/18. Costella Hatcher with [facility] call to get verbal order from Dr. Larose Kells as follow: 1X a week for 5weeks. His call back number is [phone].

## 2018-03-01 ENCOUNTER — Encounter: Payer: Self-pay | Admitting: Endocrinology

## 2018-03-01 ENCOUNTER — Ambulatory Visit (INDEPENDENT_AMBULATORY_CARE_PROVIDER_SITE_OTHER): Payer: Medicare Other | Admitting: Endocrinology

## 2018-03-01 VITALS — BP 140/78 | HR 89 | Ht 69.0 in | Wt 207.6 lb

## 2018-03-01 DIAGNOSIS — D631 Anemia in chronic kidney disease: Secondary | ICD-10-CM | POA: Diagnosis not present

## 2018-03-01 DIAGNOSIS — E1136 Type 2 diabetes mellitus with diabetic cataract: Secondary | ICD-10-CM | POA: Diagnosis not present

## 2018-03-01 DIAGNOSIS — Z7951 Long term (current) use of inhaled steroids: Secondary | ICD-10-CM | POA: Diagnosis not present

## 2018-03-01 DIAGNOSIS — K219 Gastro-esophageal reflux disease without esophagitis: Secondary | ICD-10-CM | POA: Diagnosis not present

## 2018-03-01 DIAGNOSIS — I509 Heart failure, unspecified: Secondary | ICD-10-CM | POA: Diagnosis not present

## 2018-03-01 DIAGNOSIS — N181 Chronic kidney disease, stage 1: Secondary | ICD-10-CM

## 2018-03-01 DIAGNOSIS — N183 Chronic kidney disease, stage 3 (moderate): Secondary | ICD-10-CM | POA: Diagnosis not present

## 2018-03-01 DIAGNOSIS — R1319 Other dysphagia: Secondary | ICD-10-CM | POA: Diagnosis not present

## 2018-03-01 DIAGNOSIS — Z9981 Dependence on supplemental oxygen: Secondary | ICD-10-CM | POA: Diagnosis not present

## 2018-03-01 DIAGNOSIS — J9611 Chronic respiratory failure with hypoxia: Secondary | ICD-10-CM | POA: Diagnosis not present

## 2018-03-01 DIAGNOSIS — E1122 Type 2 diabetes mellitus with diabetic chronic kidney disease: Secondary | ICD-10-CM | POA: Diagnosis not present

## 2018-03-01 DIAGNOSIS — M1991 Primary osteoarthritis, unspecified site: Secondary | ICD-10-CM | POA: Diagnosis not present

## 2018-03-01 DIAGNOSIS — E039 Hypothyroidism, unspecified: Secondary | ICD-10-CM | POA: Diagnosis not present

## 2018-03-01 DIAGNOSIS — K5792 Diverticulitis of intestine, part unspecified, without perforation or abscess without bleeding: Secondary | ICD-10-CM | POA: Diagnosis not present

## 2018-03-01 DIAGNOSIS — J449 Chronic obstructive pulmonary disease, unspecified: Secondary | ICD-10-CM | POA: Diagnosis not present

## 2018-03-01 DIAGNOSIS — Z7982 Long term (current) use of aspirin: Secondary | ICD-10-CM | POA: Diagnosis not present

## 2018-03-01 DIAGNOSIS — G4733 Obstructive sleep apnea (adult) (pediatric): Secondary | ICD-10-CM | POA: Diagnosis not present

## 2018-03-01 DIAGNOSIS — I130 Hypertensive heart and chronic kidney disease with heart failure and stage 1 through stage 4 chronic kidney disease, or unspecified chronic kidney disease: Secondary | ICD-10-CM | POA: Diagnosis not present

## 2018-03-01 DIAGNOSIS — Z96653 Presence of artificial knee joint, bilateral: Secondary | ICD-10-CM | POA: Diagnosis not present

## 2018-03-01 DIAGNOSIS — Z87440 Personal history of urinary (tract) infections: Secondary | ICD-10-CM | POA: Diagnosis not present

## 2018-03-01 DIAGNOSIS — Z87891 Personal history of nicotine dependence: Secondary | ICD-10-CM | POA: Diagnosis not present

## 2018-03-01 DIAGNOSIS — E785 Hyperlipidemia, unspecified: Secondary | ICD-10-CM | POA: Diagnosis not present

## 2018-03-01 MED ORDER — SITAGLIPTIN PHOSPHATE 50 MG PO TABS: 50.0000 mg | ORAL_TABLET | Freq: Every day | ORAL | 11 refills | Status: DC

## 2018-03-01 NOTE — Patient Instructions (Addendum)
Your blood pressure is high today.  Please see your primary care provider soon, to have it rechecked check your blood sugar once a day.  vary the time of day when you check, between before the 3 meals, and at bedtime.  also check if you have symptoms of your blood sugar being too high or too low.  please keep a record of the readings and bring it to your next appointment here.  please call us sooner if your blood sugar goes below 70, or if it stays over 200.   Please continue the same Januvia.   Please come back for a follow-up appointment in 3-4 months.

## 2018-03-01 NOTE — Progress Notes (Signed)
   Subjective:    Patient ID: [name], female    DOB: 1951-04-03, 67 y.o.   MRN: [id number]  HPI Pt returns for f/u of diabetes mellitus: DM type: 2 Dx'ed: 8588 Complications: polyneuropathy and renal insufficiency.   Therapy: januvia GDM: never.  DKA: never. Severe hypoglycemia: once, in 2019 Pancreatitis: never.  Other: she declines weight-loss surgery; she took insulin 2004-2019--she stopped due to weight loss Interval history: she brings a record of her cbg's which I have reviewed today.  It varies from 90-140.  There is no trend throughout the day.    Review of Systems She denies hypoglycemia    Objective:   Physical Exam VITAL SIGNS:  See vs page GENERAL: no distress.  Has 02 on.  Pulses: foot pulses are intact bilaterally.   MSK: no deformity of the feet or ankles.  CV: 1+ bilat leg edema, and bilat vv's.    Skin:  no ulcer on the feet or ankles.  normal color and temp on the feet and ankles.  Neuro: sensation is intact to touch on the feet and ankles, but decreased from normal.  Ext: There is bilateral onychomycosis of the toenails.   Lab Results  Component Value Date   CREATININE 0.83 02/24/2018   BUN 8 02/24/2018   NA 137 02/24/2018   K 3.1 (L) 02/24/2018   CL 98 02/24/2018   CO2 30 02/24/2018     Lab Results  Component Value Date   HGBA1C 5.8 02/24/2018      Assessment & Plan:  HTN: is noted today Type 2 DM, with polyneuropathy: well-controlled.  Renal insuff: this limits rx options and dosages.  Weight loss: this has reduced DM medication requirement.  We'll follow  Patient Instructions  Your blood pressure is high today.  Please see your primary care provider soon, to have it rechecked check your blood sugar once a day.  vary the time of day when you check, between before the 3 meals, and at bedtime.  also check if you have symptoms of your blood sugar being too high or too low.  please keep a record of the readings and bring it to your  next appointment here.  please call us sooner if your blood sugar goes below 70, or if it stays over 200.   Please continue the same Januvia.   Please come back for a follow-up appointment in 3-4 months.

## 2018-03-03 NOTE — Telephone Encounter (Signed)
Orders signed and faxed to [facility] at [phone]. Form sent for scanning.  

## 2018-03-04 ENCOUNTER — Ambulatory Visit (INDEPENDENT_AMBULATORY_CARE_PROVIDER_SITE_OTHER): Payer: Medicare Other | Admitting: Cardiology

## 2018-03-04 ENCOUNTER — Other Ambulatory Visit: Payer: Self-pay

## 2018-03-04 ENCOUNTER — Encounter: Payer: Self-pay | Admitting: Cardiology

## 2018-03-04 VITALS — BP 124/80 | HR 70 | Ht 69.0 in | Wt 207.0 lb

## 2018-03-04 DIAGNOSIS — R0602 Shortness of breath: Secondary | ICD-10-CM

## 2018-03-04 DIAGNOSIS — E1136 Type 2 diabetes mellitus with diabetic cataract: Secondary | ICD-10-CM | POA: Diagnosis not present

## 2018-03-04 DIAGNOSIS — J449 Chronic obstructive pulmonary disease, unspecified: Secondary | ICD-10-CM | POA: Diagnosis not present

## 2018-03-04 DIAGNOSIS — D631 Anemia in chronic kidney disease: Secondary | ICD-10-CM | POA: Diagnosis not present

## 2018-03-04 DIAGNOSIS — Z87440 Personal history of urinary (tract) infections: Secondary | ICD-10-CM | POA: Diagnosis not present

## 2018-03-04 DIAGNOSIS — Z87891 Personal history of nicotine dependence: Secondary | ICD-10-CM | POA: Diagnosis not present

## 2018-03-04 DIAGNOSIS — K219 Gastro-esophageal reflux disease without esophagitis: Secondary | ICD-10-CM | POA: Diagnosis not present

## 2018-03-04 DIAGNOSIS — N183 Chronic kidney disease, stage 3 (moderate): Secondary | ICD-10-CM | POA: Diagnosis not present

## 2018-03-04 DIAGNOSIS — I313 Pericardial effusion (noninflammatory): Secondary | ICD-10-CM | POA: Diagnosis not present

## 2018-03-04 DIAGNOSIS — E785 Hyperlipidemia, unspecified: Secondary | ICD-10-CM | POA: Diagnosis not present

## 2018-03-04 DIAGNOSIS — R079 Chest pain, unspecified: Secondary | ICD-10-CM

## 2018-03-04 DIAGNOSIS — R7689 Other specified abnormal immunological findings in serum: Secondary | ICD-10-CM

## 2018-03-04 DIAGNOSIS — Z7951 Long term (current) use of inhaled steroids: Secondary | ICD-10-CM | POA: Diagnosis not present

## 2018-03-04 DIAGNOSIS — I130 Hypertensive heart and chronic kidney disease with heart failure and stage 1 through stage 4 chronic kidney disease, or unspecified chronic kidney disease: Secondary | ICD-10-CM | POA: Diagnosis not present

## 2018-03-04 DIAGNOSIS — I10 Essential (primary) hypertension: Secondary | ICD-10-CM

## 2018-03-04 DIAGNOSIS — G4733 Obstructive sleep apnea (adult) (pediatric): Secondary | ICD-10-CM | POA: Diagnosis not present

## 2018-03-04 DIAGNOSIS — Z9981 Dependence on supplemental oxygen: Secondary | ICD-10-CM | POA: Diagnosis not present

## 2018-03-04 DIAGNOSIS — J9611 Chronic respiratory failure with hypoxia: Secondary | ICD-10-CM | POA: Diagnosis not present

## 2018-03-04 DIAGNOSIS — I3139 Other pericardial effusion (noninflammatory): Secondary | ICD-10-CM

## 2018-03-04 DIAGNOSIS — Z96653 Presence of artificial knee joint, bilateral: Secondary | ICD-10-CM | POA: Diagnosis not present

## 2018-03-04 DIAGNOSIS — K5792 Diverticulitis of intestine, part unspecified, without perforation or abscess without bleeding: Secondary | ICD-10-CM | POA: Diagnosis not present

## 2018-03-04 DIAGNOSIS — E1122 Type 2 diabetes mellitus with diabetic chronic kidney disease: Secondary | ICD-10-CM | POA: Diagnosis not present

## 2018-03-04 DIAGNOSIS — I509 Heart failure, unspecified: Secondary | ICD-10-CM | POA: Diagnosis not present

## 2018-03-04 DIAGNOSIS — E039 Hypothyroidism, unspecified: Secondary | ICD-10-CM | POA: Diagnosis not present

## 2018-03-04 DIAGNOSIS — R1319 Other dysphagia: Secondary | ICD-10-CM | POA: Diagnosis not present

## 2018-03-04 DIAGNOSIS — R768 Other specified abnormal immunological findings in serum: Secondary | ICD-10-CM

## 2018-03-04 DIAGNOSIS — Z7982 Long term (current) use of aspirin: Secondary | ICD-10-CM | POA: Diagnosis not present

## 2018-03-04 DIAGNOSIS — M1991 Primary osteoarthritis, unspecified site: Secondary | ICD-10-CM | POA: Diagnosis not present

## 2018-03-04 NOTE — Patient Outreach (Signed)
[facility]) Care Management  03/04/2018  STORM DULSKI Oct 08, 1950 [id number]    67 year old with hsitory ofPericarditis,OSA, asthma, chornic hypoxia respiratory failure, HTN, DM.  8/14-9/24[facility] skilled facility for rehabilitation. 8/18-8/21 postprandial nausea/vomiting-hospitalized and discharged to Skilled facility. 8/12-8/14 with chest pain.  7/9-7/12/4for tachycardia 6/10-6/15 with acute respiratory failure with hypoxia.  RNCM called to follow up. Client reports she is doing alright. She reports she has an appointment with her cardiologist today and is awaiting her personal care assistant to come. Client reports her transportation to her appointment is arranged.  Plan: telephonic follow up next week.   Thea Silversmith, RN, MSN, [place] Coordinator Cell: [phone]

## 2018-03-04 NOTE — Patient Instructions (Signed)
Medication Instructions:  Your physician recommends that you continue on your current medications as directed. Please refer to the Current Medication list given to you today.  If you need a refill on your cardiac medications before your next appointment, please call your pharmacy.   Lab work: Today: ProBNP, CRP and Sed rate  If you have labs (blood work) drawn today and your tests are completely normal, you will receive your results only by: Marland Kitchen MyChart Message (if you have MyChart) OR . A paper copy in the mail If you have any lab test that is abnormal or we need to change your treatment, we will call you to review the results.  Follow-Up: At [facility], you and your health needs are our priority.  As part of our continuing mission to provide you with exceptional heart care, we have created designated Provider Care Teams.  These Care Teams include your primary Cardiologist (physician) and Advanced Practice Providers (APPs -  Physician Assistants and Nurse Practitioners) who all work together to provide you with the care you need, when you need it.  You have been referred to Rheumatology.   Your physician wants you to follow-up in: 6 months with PA.  Please call our office to schedule the follow-up appointment.   You will need a follow up appointment in 1 years.  Please call our office 2 months in advance to schedule this appointment.  You may see Fransico Him, MD or one of the following Advanced Practice Providers on your designated Care Team:   [place], PA-C Melina Copa, PA-C . Ermalinda Barrios, PA-C

## 2018-03-04 NOTE — Progress Notes (Signed)
Cardiology Office Note:    Date:  03/04/2018   ID:  [name], DOB Mar 26, 1951, MRN [id number]  PCP:  Melissa Branch, MD  Cardiologist:  Melissa Him, MD    Referring MD: Melissa Branch, MD   No chief complaint on file.   History of Present Illness:    [name] is a 67 y.o. female with a hx of HLD, HTN, DM II, OSA on CPAP, hypothyroidism and obesity.  She had a previous Myoview obtained on 09/10/2016 that showed no ST segment deviation, EF 45% with normal perfusion.  Follow-up echocardiogram obtained on 09/09/2016 showed EF 55 to 60%, no wall motion abnormality.  She uses 3 L nasal cannula oxygen at home.    She was admitted on 10/19/2017 with acute onset of shortness of breath.  CTA of the chest revealed multifocal groundglass opacity with differentials include pulmonary edema versus infectious versus inflammatory etiology.  There was also small to moderate pericardial effusion measuring up to 2 cm on the CTA.  Her TSH was high, Synthroid was increased.  She was also severely hypoglycemic and was given D50.  Cardiology service was consulted for pericardial effusion.  Echocardiogram obtained on 10/20/2017 showed EF 55 to 60%, mild LVH, mild pericardial effusion.  She eventually underwent cardiac catheterization on 10/22/2017 which showed normal coronaries, EF 55 to 65%.  Due to concern of her symptoms related to pericarditis, patient was started on Motrin 600 mg 3 times daily for 2 weeks and colchicine 0.6 mg twice daily for 37-month along with PPI GI prophylaxis.  It was also recommended for her to follow-up with rheumatology for further evaluation.  Due to low LVEDP on cath, it was recommended that she stop the Lasix and potassium supplement.    Since discharge from the hospital, she was readmitted on 11/12/2017 with shortness of breath.  She also had recurrent chest discomfort.  Due to the negative cardiac catheterization, no further ischemic work-up was done. 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  Pulmonology service recommended discontinuation of diltiazem as positive causative agent and a repeat chest CT in 8 weeks.  Unfortunately, patient was readmitted on 7/9 with generalized weakness and tachycardia.  He was found to have a urinary tract infection and diarrhea.  C. difficile was negative.  She was ruled out of PE through VQ scan.  However on 7/11, she had unresponsive episode.  Code stroke was activated and the patient was evaluated by neurology service.  EEG was unremarkable.  MRI of the brain was negative.  She did develop acute renal failure, this was felt to be prerenal.  Renal ultrasound was normal.  Her Micardis was stopped.  She was eventually discharged home.   She was seen back in the office on 12/18/2017 by the extender and was continued complaint of chest pain under her left breast worse with lying down or taking a deep breath.  It was felt to be more related to musculoskeletal etiology.  She was still taking colchicine 0.6 mg twice daily and she still was complaining of chronic shortness of breath.  Her blood pressure was mildly elevated and her metoprolol was increased to 37.5 mg twice daily.  Repeat limited echo showed a trivial pericardial effusion posterior to the heart.  This was actually improved from the echo on 10/20/2017.  She is here today for follow-up.  She says that she continues to have a chest pain but thinks  it could be related to indigestion.  She has been having a lot of burning in her chest and feeling that food is getting caught in her chest.  Her pain is worse when she lies down and occasionally worse with taking a deep breath in.  She is chronically nauseated.  She has chronic shortness of breath which she says is not changed in some time.  She is pretty sedentary though and is in wheelchair today.  She complains of chronic ankle edema.  She denies any dizziness or syncope,  PND orthopnea.  Past Medical History:  Diagnosis Date  . Anterolisthesis    Grade 1, L4-5  . Bronchospasm 05/28/2012  . CHEST PAIN 11/18/2007  . DEGENERATIVE JOINT DISEASE 10/06/2006  . DEPRESSION 09/26/2008  . DIABETES MELLITUS 10/06/2006  . Diverticulosis    8119,1478  . GERD (gastroesophageal reflux disease) 07/25/2013  . HYPERLIPIDEMIA 01/11/2009  . HYPERTENSION 10/06/2006  . INSOMNIA 09/26/2008  . Internal hemorrhoids   . OBSTRUCTIVE SLEEP APNEA 06/23/2008   Severe OSA per sleep study 2010, Rx a CPAP  . Pain in joint, multiple sites 11/10/2006  . UNSPECIFIED ANEMIA 12/10/2009  . UTI (urinary tract infection) 11/2017    Past Surgical History:  Procedure Laterality Date  . BIOPSY  12/28/2017   Procedure: BIOPSY;  Surgeon: [name]., MD;  Location: [place];  Service: Gastroenterology;;  . COLONOSCOPY  08/01/2011   Procedure: COLONOSCOPY;  Surgeon: Melissa Castle, MD;  Location: [facility];  Service: Endoscopy;  Laterality: N/A;  . ESOPHAGOGASTRODUODENOSCOPY (EGD) WITH PROPOFOL N/A 12/28/2017   Procedure: ESOPHAGOGASTRODUODENOSCOPY (EGD) WITH PROPOFOL;  Surgeon: Rush Landmark [name]., MD;  Location: [place];  Service: Gastroenterology;  Laterality: N/A;  . LEFT HEART CATH AND CORONARY ANGIOGRAPHY N/A 10/22/2017   Procedure: LEFT HEART CATH AND CORONARY ANGIOGRAPHY;  Surgeon: Melissa Booze, MD;  Location: [place] [facility];  Service: Cardiovascular;  Laterality: N/A;  . 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 Right knee replacement  2005    Current Medications: Current Meds  Medication Sig  . ACCU-CHEK SOFTCLIX LANCETS lancets USE TO CHECK BLOOD SUGAR TWICE A DAY E11.9  . albuterol (PROVENTIL HFA;VENTOLIN HFA) 108 (90 Base) MCG/ACT inhaler Inhale 2 puffs into the lungs every 4 (four) hours as needed for wheezing.  Marland Kitchen aspirin EC 81 MG tablet Take 1 tablet (81 mg total) by mouth daily. With food  . Blood Glucose Monitoring Suppl (ACCU-CHEK AVIVA) device Use as  instructed to check blood sugar twice daily dx E11.9  . escitalopram (LEXAPRO) 10 MG tablet Take 1 tablet (10 mg total) by mouth daily.  . famotidine (PEPCID) 20 MG tablet Take 1 tablet (20 mg total) by mouth daily as needed for heartburn or indigestion.  . Fluticasone-Salmeterol (ADVAIR) 250-50 MCG/DOSE AEPB Inhale 2 puffs into the lungs daily.   Marland Kitchen glucose blood (ACCU-CHEK AVIVA PLUS) test strip TEST BLOD SUGAR TWICE DAILY AND LANCETS TWICE DAILY E11.9  . latanoprost (XALATAN) 0.005 % ophthalmic solution USE 1 DROP IN BOTH EYES AT BEDTIME (Patient taking differently: Place 1 drop into both eyes at bedtime. USE 1 DROP IN BOTH EYES AT BEDTIME)  . levothyroxine (SYNTHROID, LEVOTHROID) 75 MCG tablet Take 1 tablet (75 mcg total) by mouth daily before breakfast.  . metoprolol tartrate (LOPRESSOR) 25 MG tablet Take 37.5 mg by mouth 2 (two) times daily. (1.5 tablets twice a day)  . ondansetron (ZOFRAN-ODT) 4 MG disintegrating tablet Take 1 tablet (4 mg total) by mouth every 8 (eight) hours as  needed for nausea or vomiting.  Marland Kitchen OVER THE COUNTER MEDICATION CPAP  . OXYGEN Inhale 2 L into the lungs continuous.   . pantoprazole (PROTONIX) 40 MG tablet Take 1 tablet (40 mg total) by mouth 2 (two) times daily.  . potassium chloride (K-DUR) 10 MEQ tablet Take 1 tablet (10 mEq total) by mouth daily.  . rosuvastatin (CRESTOR) 40 MG tablet Take 1 tablet (40 mg total) by mouth daily. (Patient taking differently: Take 40 mg by mouth every evening. )  . sitaGLIPtin (JANUVIA) 50 MG tablet Take 1 tablet (50 mg total) by mouth daily.     Allergies:   Patient has no known allergies.   Social History   Socioeconomic History  . Marital status: Widowed    Spouse name: Not on file  . Number of children: 4  . Years of education: Not on file  . Highest education level: Not on file  Occupational History  . Occupation: disability    Employer: UNEMPLOYED  Social Needs  . Financial resource strain: Not on file  . Food  insecurity:    Worry: Not on file    Inability: Not on file  . Transportation needs:    Medical: Not on file    Non-medical: Not on file  Tobacco Use  . Smoking status: Former Smoker    Packs/day: 0.20    Years: 4.00    Pack years: 0.80    Types: Cigarettes    Last attempt to quit: 05/13/1995    Years since quitting: 22.8  . Smokeless tobacco: Never Used  Substance and Sexual Activity  . Alcohol use: Not Currently  . Drug use: No  . Sexual activity: Not Currently  Lifestyle  . Physical activity:    Days per week: Not on file    Minutes per session: Not on file  . Stress: Not on file  Relationships  . Social connections:    Talks on phone: Not on file    Gets together: Not on file    Attends religious service: Not on file    Active member of club or organization: Not on file    Attends meetings of clubs or organizations: Not on file    Relationship status: Not on file  Other Topics Concern  . Not on file  Social History Narrative   Widow , lives by herself   Lost a son, 3 living    Lost husband     Family History: The patient's family history includes Asthma in her mother; Diabetes in her other; Hypertension in her sister; Pancreatic cancer in her brother; Stroke in her mother. There is no history of Melissa cancer, Prostate cancer, Breast cancer, Esophageal cancer, Liver disease, Rectal cancer, or Stomach cancer.  ROS:   Please see the history of present illness.    ROS  All other systems reviewed and negative.   EKGs/Labs/Other Studies Reviewed:    The following studies were reviewed today: 2D echo 12/2017  EKG:  EKG is not ordered today.   Recent Labs: 10/20/2017: Magnesium 1.9 12/27/2017: B Natriuretic Peptide 278.7 02/24/2018: ALT 6; BUN 8; Creatinine, Ser 0.83; Hemoglobin 9.7; Platelets 360.0; Potassium 3.1; Sodium 137; TSH 1.97   Recent Lipid Panel    Component Value Date/Time   CHOL 94 11/18/2017 0012   TRIG 110 11/18/2017 0012   HDL 30 (L) 11/18/2017  0012   CHOLHDL 3.1 11/18/2017 0012   VLDL 22 11/18/2017 0012   LDLCALC 42 11/18/2017 0012    Physical Exam:  VS:  BP 124/80   Pulse 70   Ht 5\' 9"  (1.753 m)   Wt 207 lb (93.9 kg) Comment: Pt stated her weight  BMI 30.57 kg/m     Wt Readings from Last 3 Encounters:  03/04/18 207 lb (93.9 kg)  03/01/18 207 lb 9.6 oz (94.2 kg)  02/10/18 216 lb 12.8 oz (98.3 kg)     GEN:  Well nourished, well developed in no acute distress HEENT: Normal NECK: No JVD; No carotid bruits LYMPHATICS: No lymphadenopathy CARDIAC: RRR, no murmurs, rubs, gallops RESPIRATORY:  Clear to auscultation without rales, wheezing or rhonchi  ABDOMEN: Soft, non-tender, non-distended MUSCULOSKELETAL:  Trace ankle edema; No deformity  SKIN: Warm and dry NEUROLOGIC:  Alert and oriented x 3 PSYCHIATRIC:  Normal affect   ASSESSMENT:    1. Pericardial effusion   2. Essential hypertension   3. Morbid obesity ([place])   4. Chest pain, unspecified type    PLAN:    In order of problems listed above:  1.  Pericardial effusion - she continues to have chest pain although it it really sounds more like it is related to GERD.  Her last echo in August showed a trivial posterior pericardial effusion.  I am going to check a CRP and sed rate today.  There was some question in the past that she could have drug-induced lupus.  She was referred to rheumatology but never followed up with her appointment.  At this time we did check a CRP and sed rate.  If these are normal than it is unlikely that her chest pain is related to underlying pericarditis and is more likely due to GERD.  2.  HTN - BP is controlled on exam today.  She will continue on Lopressor 37.5 mg twice daily.  3.  Morbid obesity - her BMI is down to 30 now.  She has lost over 100 pounds in the past year.  Weight loss is of unclear etiology.  She has been seeing her PCP for this.  Again question whether she is got some underlying rheumatologic disorder going on.    4.   Chest pain - atypical and likely related to GERD.  See above will order a CRP and sed rate.  She is followed by her PCP for GERD.   5.  SOB - she complains of SOB that is chronic as well as ankle edema.  She does have some very mild ankle edema on exam.  She is pretty sedentary though and I suspect this is related to chronic venous stasis from her legs hanging down while she sits all day.  I will check a BNP today.   Medication Adjustments/Labs and Tests Ordered: Current medicines are reviewed at length with the patient today.  Concerns regarding medicines are outlined above.  No orders of the defined types were placed in this encounter.  No orders of the defined types were placed in this encounter.   Signed, Melissa Him, MD  03/04/2018 1:33 PM    [facility]

## 2018-03-05 DIAGNOSIS — G4733 Obstructive sleep apnea (adult) (pediatric): Secondary | ICD-10-CM | POA: Diagnosis not present

## 2018-03-05 DIAGNOSIS — E039 Hypothyroidism, unspecified: Secondary | ICD-10-CM | POA: Diagnosis not present

## 2018-03-05 DIAGNOSIS — Z7951 Long term (current) use of inhaled steroids: Secondary | ICD-10-CM | POA: Diagnosis not present

## 2018-03-05 DIAGNOSIS — E785 Hyperlipidemia, unspecified: Secondary | ICD-10-CM | POA: Diagnosis not present

## 2018-03-05 DIAGNOSIS — Z7982 Long term (current) use of aspirin: Secondary | ICD-10-CM | POA: Diagnosis not present

## 2018-03-05 DIAGNOSIS — E1122 Type 2 diabetes mellitus with diabetic chronic kidney disease: Secondary | ICD-10-CM | POA: Diagnosis not present

## 2018-03-05 DIAGNOSIS — K5792 Diverticulitis of intestine, part unspecified, without perforation or abscess without bleeding: Secondary | ICD-10-CM | POA: Diagnosis not present

## 2018-03-05 DIAGNOSIS — Z87891 Personal history of nicotine dependence: Secondary | ICD-10-CM | POA: Diagnosis not present

## 2018-03-05 DIAGNOSIS — J449 Chronic obstructive pulmonary disease, unspecified: Secondary | ICD-10-CM | POA: Diagnosis not present

## 2018-03-05 DIAGNOSIS — J9611 Chronic respiratory failure with hypoxia: Secondary | ICD-10-CM | POA: Diagnosis not present

## 2018-03-05 DIAGNOSIS — R1319 Other dysphagia: Secondary | ICD-10-CM | POA: Diagnosis not present

## 2018-03-05 DIAGNOSIS — Z96653 Presence of artificial knee joint, bilateral: Secondary | ICD-10-CM | POA: Diagnosis not present

## 2018-03-05 DIAGNOSIS — Z87440 Personal history of urinary (tract) infections: Secondary | ICD-10-CM | POA: Diagnosis not present

## 2018-03-05 DIAGNOSIS — Z9981 Dependence on supplemental oxygen: Secondary | ICD-10-CM | POA: Diagnosis not present

## 2018-03-05 DIAGNOSIS — N183 Chronic kidney disease, stage 3 (moderate): Secondary | ICD-10-CM | POA: Diagnosis not present

## 2018-03-05 DIAGNOSIS — M1991 Primary osteoarthritis, unspecified site: Secondary | ICD-10-CM | POA: Diagnosis not present

## 2018-03-05 DIAGNOSIS — D631 Anemia in chronic kidney disease: Secondary | ICD-10-CM | POA: Diagnosis not present

## 2018-03-05 DIAGNOSIS — K219 Gastro-esophageal reflux disease without esophagitis: Secondary | ICD-10-CM | POA: Diagnosis not present

## 2018-03-05 DIAGNOSIS — E1136 Type 2 diabetes mellitus with diabetic cataract: Secondary | ICD-10-CM | POA: Diagnosis not present

## 2018-03-05 DIAGNOSIS — I509 Heart failure, unspecified: Secondary | ICD-10-CM | POA: Diagnosis not present

## 2018-03-05 DIAGNOSIS — I130 Hypertensive heart and chronic kidney disease with heart failure and stage 1 through stage 4 chronic kidney disease, or unspecified chronic kidney disease: Secondary | ICD-10-CM | POA: Diagnosis not present

## 2018-03-05 LAB — SEDIMENTATION RATE: Sed Rate: 91 mm/hr — ABNORMAL HIGH (ref 0–40)

## 2018-03-05 LAB — C-REACTIVE PROTEIN: CRP: 33 mg/L — ABNORMAL HIGH (ref 0–10)

## 2018-03-05 LAB — PRO B NATRIURETIC PEPTIDE: NT-PRO BNP: 1747 pg/mL — AB (ref 0–301)

## 2018-03-08 ENCOUNTER — Telehealth: Payer: Self-pay

## 2018-03-08 DIAGNOSIS — R768 Other specified abnormal immunological findings in serum: Secondary | ICD-10-CM

## 2018-03-08 NOTE — Telephone Encounter (Signed)
-----   Message from Sueanne Margarita, MD sent at 03/07/2018  3:18 PM EDT ----- Dr. Judi Cong ----- Message ----- From: Sarina Ill, RN Sent: 03/05/2018   3:40 PM EDT To: Sueanne Margarita, MD  Hello Dr. Radford Pax, The patient cant be referred to Dr. Trudie Reed due to no shows. Any other Rheumatologists? Have a wonderful weekend! Thanks, [employer]

## 2018-03-09 ENCOUNTER — Telehealth: Payer: Self-pay

## 2018-03-09 DIAGNOSIS — R700 Elevated erythrocyte sedimentation rate: Secondary | ICD-10-CM

## 2018-03-09 DIAGNOSIS — E785 Hyperlipidemia, unspecified: Secondary | ICD-10-CM | POA: Diagnosis not present

## 2018-03-09 DIAGNOSIS — Z7951 Long term (current) use of inhaled steroids: Secondary | ICD-10-CM | POA: Diagnosis not present

## 2018-03-09 DIAGNOSIS — E1122 Type 2 diabetes mellitus with diabetic chronic kidney disease: Secondary | ICD-10-CM | POA: Diagnosis not present

## 2018-03-09 DIAGNOSIS — I319 Disease of pericardium, unspecified: Secondary | ICD-10-CM

## 2018-03-09 DIAGNOSIS — G4733 Obstructive sleep apnea (adult) (pediatric): Secondary | ICD-10-CM | POA: Diagnosis not present

## 2018-03-09 DIAGNOSIS — K5792 Diverticulitis of intestine, part unspecified, without perforation or abscess without bleeding: Secondary | ICD-10-CM | POA: Diagnosis not present

## 2018-03-09 DIAGNOSIS — I509 Heart failure, unspecified: Secondary | ICD-10-CM | POA: Diagnosis not present

## 2018-03-09 DIAGNOSIS — Z87440 Personal history of urinary (tract) infections: Secondary | ICD-10-CM | POA: Diagnosis not present

## 2018-03-09 DIAGNOSIS — R1319 Other dysphagia: Secondary | ICD-10-CM | POA: Diagnosis not present

## 2018-03-09 DIAGNOSIS — Z9981 Dependence on supplemental oxygen: Secondary | ICD-10-CM | POA: Diagnosis not present

## 2018-03-09 DIAGNOSIS — D631 Anemia in chronic kidney disease: Secondary | ICD-10-CM | POA: Diagnosis not present

## 2018-03-09 DIAGNOSIS — E1136 Type 2 diabetes mellitus with diabetic cataract: Secondary | ICD-10-CM | POA: Diagnosis not present

## 2018-03-09 DIAGNOSIS — R7982 Elevated C-reactive protein (CRP): Secondary | ICD-10-CM

## 2018-03-09 DIAGNOSIS — K219 Gastro-esophageal reflux disease without esophagitis: Secondary | ICD-10-CM | POA: Diagnosis not present

## 2018-03-09 DIAGNOSIS — I130 Hypertensive heart and chronic kidney disease with heart failure and stage 1 through stage 4 chronic kidney disease, or unspecified chronic kidney disease: Secondary | ICD-10-CM | POA: Diagnosis not present

## 2018-03-09 DIAGNOSIS — E039 Hypothyroidism, unspecified: Secondary | ICD-10-CM | POA: Diagnosis not present

## 2018-03-09 DIAGNOSIS — Z96653 Presence of artificial knee joint, bilateral: Secondary | ICD-10-CM | POA: Diagnosis not present

## 2018-03-09 DIAGNOSIS — Z7982 Long term (current) use of aspirin: Secondary | ICD-10-CM | POA: Diagnosis not present

## 2018-03-09 DIAGNOSIS — Z87891 Personal history of nicotine dependence: Secondary | ICD-10-CM | POA: Diagnosis not present

## 2018-03-09 DIAGNOSIS — J9611 Chronic respiratory failure with hypoxia: Secondary | ICD-10-CM | POA: Diagnosis not present

## 2018-03-09 DIAGNOSIS — M1991 Primary osteoarthritis, unspecified site: Secondary | ICD-10-CM | POA: Diagnosis not present

## 2018-03-09 DIAGNOSIS — N183 Chronic kidney disease, stage 3 (moderate): Secondary | ICD-10-CM | POA: Diagnosis not present

## 2018-03-09 DIAGNOSIS — J449 Chronic obstructive pulmonary disease, unspecified: Secondary | ICD-10-CM | POA: Diagnosis not present

## 2018-03-09 NOTE — Telephone Encounter (Signed)
Spoke with the patient about her lab results, she accepted having a cardiac MRI. Sending message to [facility] to schedule.      Notes recorded by Sueanne Margarita, MD on 03/05/2018 at 9:23 AM EDT CRP and sed rate are elevated. Please get cardiac MRI to assess for pericardial inflammation

## 2018-03-10 ENCOUNTER — Ambulatory Visit (INDEPENDENT_AMBULATORY_CARE_PROVIDER_SITE_OTHER): Payer: Medicare Other | Admitting: Gastroenterology

## 2018-03-10 ENCOUNTER — Telehealth: Payer: Self-pay | Admitting: *Deleted

## 2018-03-10 ENCOUNTER — Encounter: Payer: Self-pay | Admitting: *Deleted

## 2018-03-10 ENCOUNTER — Other Ambulatory Visit: Payer: Medicare Other

## 2018-03-10 VITALS — BP 118/80 | HR 68 | Ht 69.0 in

## 2018-03-10 DIAGNOSIS — Z8619 Personal history of other infectious and parasitic diseases: Secondary | ICD-10-CM | POA: Diagnosis not present

## 2018-03-10 DIAGNOSIS — K30 Functional dyspepsia: Secondary | ICD-10-CM

## 2018-03-10 DIAGNOSIS — R12 Heartburn: Secondary | ICD-10-CM

## 2018-03-10 NOTE — Patient Instructions (Signed)
Restart your Pantoprazole twice daily after H pylori test has been read  Please call the office next month to schedule  an appointment for January  Your provider has requested that you go to the basement level for lab work before leaving today. Press "B" on the elevator. The lab is located at the first door on the left as you exit the elevator.  Thank you for entrusting me with your care and choosing [facility].  Dr Rush Landmark

## 2018-03-10 NOTE — Telephone Encounter (Signed)
Left message regarding appointment date and time for Cardiac MRI---will also mail information to patient

## 2018-03-11 ENCOUNTER — Other Ambulatory Visit: Payer: Self-pay

## 2018-03-11 ENCOUNTER — Telehealth: Payer: Self-pay

## 2018-03-11 DIAGNOSIS — E785 Hyperlipidemia, unspecified: Secondary | ICD-10-CM | POA: Diagnosis not present

## 2018-03-11 DIAGNOSIS — K219 Gastro-esophageal reflux disease without esophagitis: Secondary | ICD-10-CM | POA: Diagnosis not present

## 2018-03-11 DIAGNOSIS — Z7982 Long term (current) use of aspirin: Secondary | ICD-10-CM | POA: Diagnosis not present

## 2018-03-11 DIAGNOSIS — Z96653 Presence of artificial knee joint, bilateral: Secondary | ICD-10-CM | POA: Diagnosis not present

## 2018-03-11 DIAGNOSIS — E1136 Type 2 diabetes mellitus with diabetic cataract: Secondary | ICD-10-CM | POA: Diagnosis not present

## 2018-03-11 DIAGNOSIS — E1122 Type 2 diabetes mellitus with diabetic chronic kidney disease: Secondary | ICD-10-CM | POA: Diagnosis not present

## 2018-03-11 DIAGNOSIS — Z9981 Dependence on supplemental oxygen: Secondary | ICD-10-CM | POA: Diagnosis not present

## 2018-03-11 DIAGNOSIS — Z87440 Personal history of urinary (tract) infections: Secondary | ICD-10-CM | POA: Diagnosis not present

## 2018-03-11 DIAGNOSIS — N183 Chronic kidney disease, stage 3 (moderate): Secondary | ICD-10-CM | POA: Diagnosis not present

## 2018-03-11 DIAGNOSIS — E039 Hypothyroidism, unspecified: Secondary | ICD-10-CM | POA: Diagnosis not present

## 2018-03-11 DIAGNOSIS — R1319 Other dysphagia: Secondary | ICD-10-CM | POA: Diagnosis not present

## 2018-03-11 DIAGNOSIS — D631 Anemia in chronic kidney disease: Secondary | ICD-10-CM | POA: Diagnosis not present

## 2018-03-11 DIAGNOSIS — J9611 Chronic respiratory failure with hypoxia: Secondary | ICD-10-CM | POA: Diagnosis not present

## 2018-03-11 DIAGNOSIS — Z7951 Long term (current) use of inhaled steroids: Secondary | ICD-10-CM | POA: Diagnosis not present

## 2018-03-11 DIAGNOSIS — Z87891 Personal history of nicotine dependence: Secondary | ICD-10-CM | POA: Diagnosis not present

## 2018-03-11 DIAGNOSIS — M1991 Primary osteoarthritis, unspecified site: Secondary | ICD-10-CM | POA: Diagnosis not present

## 2018-03-11 DIAGNOSIS — J449 Chronic obstructive pulmonary disease, unspecified: Secondary | ICD-10-CM | POA: Diagnosis not present

## 2018-03-11 DIAGNOSIS — K5792 Diverticulitis of intestine, part unspecified, without perforation or abscess without bleeding: Secondary | ICD-10-CM | POA: Diagnosis not present

## 2018-03-11 DIAGNOSIS — I509 Heart failure, unspecified: Secondary | ICD-10-CM | POA: Diagnosis not present

## 2018-03-11 DIAGNOSIS — G4733 Obstructive sleep apnea (adult) (pediatric): Secondary | ICD-10-CM | POA: Diagnosis not present

## 2018-03-11 DIAGNOSIS — I130 Hypertensive heart and chronic kidney disease with heart failure and stage 1 through stage 4 chronic kidney disease, or unspecified chronic kidney disease: Secondary | ICD-10-CM | POA: Diagnosis not present

## 2018-03-11 NOTE — Patient Outreach (Signed)
[facility]) Care Management  03/11/2018  [name] December 23, 1950 [id number]  Subjective: "My issues now are I want to move and transportation.  Assessment: 67 year old with hsitory ofPericarditis,OSA, asthma, chornic hypoxia respiratory failure, HTN, DM.  8/14-9/24[facility] skilled facility for rehabilitation. 8/18-8/21 postprandial nausea/vomiting-hospitalized and discharged to Skilled facility. 8/12-8/14 with chest pain.  7/9-7/12/73fr tachycardia 6/10-6/15 with acute respiratory failure with hypoxia.  RNCM called to follow up. Client reports she continues to be active with home health services.  Concerns expressed today are around transportation issues. Client reports that she had an appointment yesterday. She states the driver did call, but he was not told that he had to come to the door to assist her. She also states that no one came back to pick her up.   Client reports her blood sugars are good. She states no increased issues with her breathing. She reports her blood sugars are controlled and that her most recent blood pressures have been 124/80 and 118/80.  Client's primary concern is finding another place to live and transportation.  Plan: home visit next month, continue education reinforcement of COPD education..Margie BilletCM Care Plan Problem One     Most Recent Value  Care Plan Problem One  at risk for readmission as evidence by recent admission/skilled facility stay.  Role Documenting the Problem One  Care Management Coordinator  Care Plan for Problem One  Not Active  [facility]   client will not be readmitted within the next 31 days.  THN Long Term Goal Start Date  02/05/18  THN Long Term Goal Met Date  03/11/18  THN CM Short Term Goal #2   client will take medications as prescribed within the next 30 days.  THN CM Short Term Goal #2 Start Date  02/05/18  [facility] #2 Met Date  02/25/18    [facility]  Problem Two     Most Recent Value  Care Plan Problem Two  increased blood pressure as evidence of BP greater than 140/90  Role Documenting the Problem Two  Care Management [place]for Problem Two  Not Active  THN CM Short Term Goal #1   client will verbalize blood pressure within normal limits within the next 30 days.   THN CM Short Term Goal #1 Start Date  02/10/18  THN CM Short Term Goal #1 Met Date   03/11/18    [facility] Problem Three     Most Recent Value  Care Plan Problem Three  knowledge deficit regarding COPD management as evidence by client request for additional information.  Role Documenting the Problem Three  Care Management CCrystal Cityfor Problem Three  Active  THN Long Term Goal   client will verbalize at three COPD management strategies within the next 45-60 days.  THN Long Term Goal Start Date  03/11/18  Interventions for Problem Three Long Term Goal  RNCM discussed client's overall sense of well being today and client respiratory status as well as medications used for breathing.      JThea Silversmith RN, MSN, BTuckerCoordinator Cell: [phone]

## 2018-03-11 NOTE — Telephone Encounter (Signed)
Spoke with Dr. Radford Pax, the patient was denied by two different Rheumatologists. She advised the patient visit PCP. Spoke with the patient, she stated that she is going to get a new PCP since current PCP is too far away. She will call once she gets another provider.

## 2018-03-12 ENCOUNTER — Encounter: Payer: Self-pay | Admitting: Gastroenterology

## 2018-03-12 ENCOUNTER — Other Ambulatory Visit: Payer: Self-pay

## 2018-03-12 DIAGNOSIS — G4733 Obstructive sleep apnea (adult) (pediatric): Secondary | ICD-10-CM | POA: Diagnosis not present

## 2018-03-12 DIAGNOSIS — J9611 Chronic respiratory failure with hypoxia: Secondary | ICD-10-CM | POA: Diagnosis not present

## 2018-03-12 DIAGNOSIS — E039 Hypothyroidism, unspecified: Secondary | ICD-10-CM | POA: Diagnosis not present

## 2018-03-12 DIAGNOSIS — E1136 Type 2 diabetes mellitus with diabetic cataract: Secondary | ICD-10-CM | POA: Diagnosis not present

## 2018-03-12 DIAGNOSIS — K219 Gastro-esophageal reflux disease without esophagitis: Secondary | ICD-10-CM | POA: Diagnosis not present

## 2018-03-12 DIAGNOSIS — J449 Chronic obstructive pulmonary disease, unspecified: Secondary | ICD-10-CM | POA: Diagnosis not present

## 2018-03-12 DIAGNOSIS — Z87891 Personal history of nicotine dependence: Secondary | ICD-10-CM | POA: Diagnosis not present

## 2018-03-12 DIAGNOSIS — Z9981 Dependence on supplemental oxygen: Secondary | ICD-10-CM | POA: Diagnosis not present

## 2018-03-12 DIAGNOSIS — N183 Chronic kidney disease, stage 3 (moderate): Secondary | ICD-10-CM | POA: Diagnosis not present

## 2018-03-12 DIAGNOSIS — R1319 Other dysphagia: Secondary | ICD-10-CM | POA: Diagnosis not present

## 2018-03-12 DIAGNOSIS — Z87440 Personal history of urinary (tract) infections: Secondary | ICD-10-CM | POA: Diagnosis not present

## 2018-03-12 DIAGNOSIS — Z96653 Presence of artificial knee joint, bilateral: Secondary | ICD-10-CM | POA: Diagnosis not present

## 2018-03-12 DIAGNOSIS — I130 Hypertensive heart and chronic kidney disease with heart failure and stage 1 through stage 4 chronic kidney disease, or unspecified chronic kidney disease: Secondary | ICD-10-CM | POA: Diagnosis not present

## 2018-03-12 DIAGNOSIS — D631 Anemia in chronic kidney disease: Secondary | ICD-10-CM | POA: Diagnosis not present

## 2018-03-12 DIAGNOSIS — I509 Heart failure, unspecified: Secondary | ICD-10-CM | POA: Diagnosis not present

## 2018-03-12 DIAGNOSIS — M1991 Primary osteoarthritis, unspecified site: Secondary | ICD-10-CM | POA: Diagnosis not present

## 2018-03-12 DIAGNOSIS — E785 Hyperlipidemia, unspecified: Secondary | ICD-10-CM | POA: Diagnosis not present

## 2018-03-12 DIAGNOSIS — E1122 Type 2 diabetes mellitus with diabetic chronic kidney disease: Secondary | ICD-10-CM | POA: Diagnosis not present

## 2018-03-12 DIAGNOSIS — R0902 Hypoxemia: Secondary | ICD-10-CM | POA: Diagnosis not present

## 2018-03-12 DIAGNOSIS — K5792 Diverticulitis of intestine, part unspecified, without perforation or abscess without bleeding: Secondary | ICD-10-CM | POA: Diagnosis not present

## 2018-03-12 DIAGNOSIS — Z7982 Long term (current) use of aspirin: Secondary | ICD-10-CM | POA: Diagnosis not present

## 2018-03-12 DIAGNOSIS — Z7951 Long term (current) use of inhaled steroids: Secondary | ICD-10-CM | POA: Diagnosis not present

## 2018-03-12 DIAGNOSIS — R12 Heartburn: Secondary | ICD-10-CM | POA: Insufficient documentation

## 2018-03-12 NOTE — Addendum Note (Signed)
Addended by: Luretha Rued on: 03/12/2018 12:00 PM   Modules accepted: Orders

## 2018-03-12 NOTE — Progress Notes (Signed)
GASTROENTEROLOGY OUTPATIENT CLINIC VISIT   Primary Care Provider [facility], [address] [place] [address] [phone]  Patient Profile: [name] is a 67 y.o. 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  The patient presents to the [facility] for an evaluation and management of problem(s) noted below:  Problem List 1. History of Helicobacter pylori infection   2. Pyrosis   3. Acid indigestion     History of Present Illness: Please see prior consultation note for full HPI of patient.  Interval History: The patient returns for scheduled follow up.  She has been off of her PPI as per our recommendations and her PCP ensuring this for [place] to try and get her HP retested.  She has not done that test as of yet.  She has experienced increased pyrosis and nausea since she has been off of her PPI.  She is also experiencing indigestion when that was better controlled previously.  No melena or hematochezia.  No significant vomiting.  She is very worried that the transportation service is going to leave her at the clinic so she asks that we be as quick as possible.  GI Review of Systems Positive as above Negative for odynophagia, jaundice, change in bowel habits  Review of Systems General: Denies fevers/chills Cardiovascular: Denies chest pain Pulmonary: SOB remains stable today but she does have concern as sometimes is experiencing more DOE than previously Gastroenterological: See HPI Genitourinary: Denies darkened urine Dermatological: Denies jaundice Psychological: Mood is anxious about not being left behind due to the transportation   Medications Current Outpatient Medications  Medication Sig Dispense Refill  . ACCU-CHEK SOFTCLIX LANCETS lancets USE TO CHECK BLOOD SUGAR TWICE A DAY E11.9 100 each 0  . albuterol (PROVENTIL  HFA;VENTOLIN HFA) 108 (90 Base) MCG/ACT inhaler Inhale 2 puffs into the lungs every 4 (four) hours as needed for wheezing. 1 Inhaler 6  . aspirin EC 81 MG tablet Take 1 tablet (81 mg total) by mouth daily. With food 30 tablet 5  . Blood Glucose Monitoring Suppl (ACCU-CHEK AVIVA) device Use as instructed to check blood sugar twice daily dx E11.9 1 each 0  . escitalopram (LEXAPRO) 10 MG tablet Take 1 tablet (10 mg total) by mouth daily. 30 tablet 0  . famotidine (PEPCID) 20 MG tablet Take 1 tablet (20 mg total) by mouth daily as needed for heartburn or indigestion. 30 tablet 2  . Fluticasone-Salmeterol (ADVAIR) 250-50 MCG/DOSE AEPB Inhale 2 puffs into the lungs daily.     Marland Kitchen glucose blood (ACCU-CHEK AVIVA PLUS) test strip TEST BLOD SUGAR TWICE DAILY AND LANCETS TWICE DAILY E11.9 200 each 0  . latanoprost (XALATAN) 0.005 % ophthalmic solution USE 1 DROP IN BOTH EYES AT BEDTIME (Patient taking differently: Place 1 drop into both eyes at bedtime. USE 1 DROP IN BOTH EYES AT BEDTIME) 2.5 mL 10  . levothyroxine (SYNTHROID, LEVOTHROID) 75 MCG tablet Take 1 tablet (75 mcg total) by mouth daily before breakfast. 30 tablet 5  . metoprolol tartrate (LOPRESSOR) 25 MG tablet Take 37.5 mg by mouth 2 (two) times daily. (1.5 tablets twice a day)    . ondansetron (ZOFRAN-ODT) 4 MG disintegrating tablet Take 1 tablet (4 mg total) by mouth every 8 (eight) hours as needed for nausea or vomiting. 20 tablet 0  . OVER THE COUNTER MEDICATION CPAP    . OXYGEN  Inhale 2 L into the lungs continuous.     . pantoprazole (PROTONIX) 40 MG tablet Take 1 tablet (40 mg total) by mouth 2 (two) times daily. 90 tablet 0  . potassium chloride (K-DUR) 10 MEQ tablet Take 1 tablet (10 mEq total) by mouth daily. 10 tablet 0  . rosuvastatin (CRESTOR) 40 MG tablet Take 1 tablet (40 mg total) by mouth daily. (Patient taking differently: Take 40 mg by mouth every evening. ) 30 tablet 5  . sitaGLIPtin (JANUVIA) 50 MG tablet Take 1 tablet (50 mg  total) by mouth daily. 30 tablet 11   No current facility-administered medications for this visit.     Allergies No Known Allergies  Histories Past Medical History:  Diagnosis Date  . Anterolisthesis    Grade 1, L4-5  . Bronchospasm 05/28/2012  . CHEST PAIN 11/18/2007  . DEGENERATIVE JOINT DISEASE 10/06/2006  . DEPRESSION 09/26/2008  . DIABETES MELLITUS 10/06/2006  . Diverticulosis    9983,3825  . GERD (gastroesophageal reflux disease) 07/25/2013  . HYPERLIPIDEMIA 01/11/2009  . HYPERTENSION 10/06/2006  . INSOMNIA 09/26/2008  . Internal hemorrhoids   . OBSTRUCTIVE SLEEP APNEA 06/23/2008   Severe OSA per sleep study 2010, Rx a CPAP  . Pain in joint, multiple sites 11/10/2006  . UNSPECIFIED ANEMIA 12/10/2009  . UTI (urinary tract infection) 11/2017   Past Surgical History:  Procedure Laterality Date  . BIOPSY  12/28/2017   Procedure: BIOPSY;  Surgeon: Irving Copas., MD;  Location: [place];  Service: Gastroenterology;;  . COLONOSCOPY  08/01/2011   Procedure: COLONOSCOPY;  Surgeon: Inda Castle, MD;  Location: [facility];  Service: Endoscopy;  Laterality: N/A;  . ESOPHAGOGASTRODUODENOSCOPY (EGD) WITH PROPOFOL N/A 12/28/2017   Procedure: ESOPHAGOGASTRODUODENOSCOPY (EGD) WITH PROPOFOL;  Surgeon: Rush Landmark Telford Nab., MD;  Location: [place];  Service: Gastroenterology;  Laterality: N/A;  . LEFT HEART CATH AND CORONARY ANGIOGRAPHY N/A 10/22/2017   Procedure: LEFT HEART CATH AND CORONARY ANGIOGRAPHY;  Surgeon: Jettie Booze, MD;  Location: [facility];  Service: Cardiovascular;  Laterality: N/A;  . 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 Right knee replacement  2005   Social History   Socioeconomic History  . Marital status: Widowed    Spouse name: Not on file  . Number of children: 4  . Years of education: Not on file  . Highest education level: Not on file  Occupational History  . Occupation: disability    Employer: UNEMPLOYED  Social Needs  .  Financial resource strain: Not on file  . Food insecurity:    Worry: Not on file    Inability: Not on file  . Transportation needs:    Medical: Not on file    Non-medical: Not on file  Tobacco Use  . Smoking status: Former Smoker    Packs/day: 0.20    Years: 4.00    Pack years: 0.80    Types: Cigarettes    Last attempt to quit: 05/13/1995    Years since quitting: 22.8  . Smokeless tobacco: Never Used  Substance and Sexual Activity  . Alcohol use: Not Currently  . Drug use: No  . Sexual activity: Not Currently  Lifestyle  . Physical activity:    Days per week: Not on file    Minutes per session: Not on file  . Stress: Not on file  Relationships  . Social connections:    Talks on phone: Not on file    Gets together: Not on file    Attends religious  service: Not on file    Active member of club or organization: Not on file    Attends meetings of clubs or organizations: Not on file    Relationship status: Not on file  . Intimate partner violence:    Fear of current or ex partner: Not on file    Emotionally abused: Not on file    Physically abused: Not on file    Forced sexual activity: Not on file  Other Topics Concern  . Not on file  Social History Narrative   Widow , lives by herself   Lost a son, 3 living    Lost husband   Family History  Problem Relation Age of Onset  . Asthma Mother   . Stroke Mother   . Diabetes Other        M, B, S  . Hypertension Sister        M, S,B  . Pancreatic cancer Brother   . Colon cancer Neg Hx   . Prostate cancer Neg Hx   . Breast cancer Neg Hx   . Esophageal cancer Neg Hx   . Liver disease Neg Hx   . Rectal cancer Neg Hx   . Stomach cancer Neg Hx   . Inflammatory bowel disease Neg Hx    I have reviewed her medical, social, and family history in detail and updated the electronic medical record as necessary.    PHYSICAL EXAMINATION  BP 118/80   Pulse 68   Ht '5\' 9"'$  (1.753 m)   BMI 30.57 kg/m  Wt Readings from Last 3  Encounters:  03/04/18 207 lb (93.9 kg)  03/01/18 207 lb 9.6 oz (94.2 kg)  02/10/18 216 lb 12.8 oz (98.3 kg)  GEN: Chronically ill-appearing, in wheelchair, anxious PSYCH: Cooperative EYE: Conjunctivae pink, sclerae anicteric ENT: Nasal cannula in place, MMM, without oral ulcers CV: Non-tachycardic without rubs or gallops RESP: Decreased breath sounds at the bases bilaterally with some rhonchi present which clear with coughing GI: Obese, rounded, NABS, soft, NT, without rebound or guarding, no HSM appreciated MSK/EXT: Bilateral pedal edema present SKIN: No jaundice NEURO:  Alert & Oriented x 3, no focal deficits   REVIEW OF DATA  I reviewed the following data at the time of this encounter:  GI Procedures and Studies  No new studies, previously reviewed  Laboratory Studies  Reviewed in epic  Imaging Studies  No new studies   ASSESSMENT  [name] is a 67 y.o. 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  The patient is seen today for evaluation and management of:  1. History of Helicobacter pylori infection   2. Pyrosis   3. Acid indigestion    The patient has been experiencing increased issues of pyrosis sand ingestion over the last few weeks.  She has been off of her PPI per her report and her PCP also confirming this.  She has not given her HP stool antigen to confirm eradication yet.  We will give her another kit.  Once she has submitted the stool kit, we need to get her back on her PPI, and I have requested that the patient do that as soon as we get the stool sample.  I suspect her symptoms are acid related and with re-initiation of medical therapy she should be feeling better.  No plan for repeat endoscopic evaluation.  All patient questions were answered, to the best of my  ability, and the patient agrees to the aforementioned plan of action with follow-up as  indicated.   PLAN  1. History of Helicobacter pylori infection - Helicobacter pylori special antigen; Future to confirm eradication (she is currently off PPI)  2. Pyrosis - After HP stool is received she can restart PPI at prior dosing  3. Acid indigestion - Same as Pyrosis above   Orders Placed This Encounter  Procedures  . Helicobacter pylori special antigen    New Prescriptions   No medications on file   Modified Medications   No medications on file    Planned Follow Up: No follow-ups on file.   Justice Britain, MD [facility] Office # [phone]

## 2018-03-12 NOTE — Patient Outreach (Signed)
[facility]) Care Management  03/12/2018  [name] 1950/08/12 [id number]   BSW received communication from [facility], Thea Silversmith, that [name] continues to have challenges with utilizing [employer].  BSW spoke with [name] about other transportation options.  BSW educated her about Armed forces technical officer through [employer] of [place].  BSW also educated [name] about [employer] services and offered to complete and submit application on her behalf.  [name] declined saying "I don't know how to use [employer]".  BSW educated her about the eligibility process and how to use the service.  BSW talked with her about the difference between this service and [employer].   BSW offered to mail policy and procedure information that outlines these services.  BSW also informed her that eligibility staff could provide education when the initial interview is conducted.  [name] continued to decline being linked with this resource.  BSW agreed to Administrator, Civil Service for [employer] as this has to be completed and sent in by patient.  [name] reported that she would like to relocate to another apartment.  She has Section 8 so BSW encouraged her to contact her caseworker at [employer] because they would have to help facilitate relocation.  BSW will follow up with [name] next week to ensure receipt of Barrister's clerk.   [name], BSW Social Worker [phone]

## 2018-03-15 DIAGNOSIS — G4733 Obstructive sleep apnea (adult) (pediatric): Secondary | ICD-10-CM | POA: Diagnosis not present

## 2018-03-15 DIAGNOSIS — R1319 Other dysphagia: Secondary | ICD-10-CM | POA: Diagnosis not present

## 2018-03-15 DIAGNOSIS — E1136 Type 2 diabetes mellitus with diabetic cataract: Secondary | ICD-10-CM | POA: Diagnosis not present

## 2018-03-15 DIAGNOSIS — K5792 Diverticulitis of intestine, part unspecified, without perforation or abscess without bleeding: Secondary | ICD-10-CM | POA: Diagnosis not present

## 2018-03-15 DIAGNOSIS — E1122 Type 2 diabetes mellitus with diabetic chronic kidney disease: Secondary | ICD-10-CM | POA: Diagnosis not present

## 2018-03-15 DIAGNOSIS — Z9981 Dependence on supplemental oxygen: Secondary | ICD-10-CM | POA: Diagnosis not present

## 2018-03-15 DIAGNOSIS — I509 Heart failure, unspecified: Secondary | ICD-10-CM | POA: Diagnosis not present

## 2018-03-15 DIAGNOSIS — Z7982 Long term (current) use of aspirin: Secondary | ICD-10-CM | POA: Diagnosis not present

## 2018-03-15 DIAGNOSIS — E039 Hypothyroidism, unspecified: Secondary | ICD-10-CM | POA: Diagnosis not present

## 2018-03-15 DIAGNOSIS — J449 Chronic obstructive pulmonary disease, unspecified: Secondary | ICD-10-CM | POA: Diagnosis not present

## 2018-03-15 DIAGNOSIS — J9611 Chronic respiratory failure with hypoxia: Secondary | ICD-10-CM | POA: Diagnosis not present

## 2018-03-15 DIAGNOSIS — K219 Gastro-esophageal reflux disease without esophagitis: Secondary | ICD-10-CM | POA: Diagnosis not present

## 2018-03-15 DIAGNOSIS — M1991 Primary osteoarthritis, unspecified site: Secondary | ICD-10-CM | POA: Diagnosis not present

## 2018-03-15 DIAGNOSIS — Z87440 Personal history of urinary (tract) infections: Secondary | ICD-10-CM | POA: Diagnosis not present

## 2018-03-15 DIAGNOSIS — Z87891 Personal history of nicotine dependence: Secondary | ICD-10-CM | POA: Diagnosis not present

## 2018-03-15 DIAGNOSIS — Z7951 Long term (current) use of inhaled steroids: Secondary | ICD-10-CM | POA: Diagnosis not present

## 2018-03-15 DIAGNOSIS — D631 Anemia in chronic kidney disease: Secondary | ICD-10-CM | POA: Diagnosis not present

## 2018-03-15 DIAGNOSIS — E785 Hyperlipidemia, unspecified: Secondary | ICD-10-CM | POA: Diagnosis not present

## 2018-03-15 DIAGNOSIS — Z96653 Presence of artificial knee joint, bilateral: Secondary | ICD-10-CM | POA: Diagnosis not present

## 2018-03-15 DIAGNOSIS — I130 Hypertensive heart and chronic kidney disease with heart failure and stage 1 through stage 4 chronic kidney disease, or unspecified chronic kidney disease: Secondary | ICD-10-CM | POA: Diagnosis not present

## 2018-03-15 DIAGNOSIS — N183 Chronic kidney disease, stage 3 (moderate): Secondary | ICD-10-CM | POA: Diagnosis not present

## 2018-03-15 NOTE — Telephone Encounter (Signed)
° °  Patient calling with questions about last telephone encounter. Requesting guidance with next steps in care. Please call

## 2018-03-16 ENCOUNTER — Other Ambulatory Visit: Payer: Self-pay | Admitting: Pharmacist

## 2018-03-16 ENCOUNTER — Telehealth: Payer: Self-pay | Admitting: *Deleted

## 2018-03-16 ENCOUNTER — Other Ambulatory Visit: Payer: Self-pay | Admitting: Internal Medicine

## 2018-03-16 MED ORDER — LEVOTHYROXINE SODIUM 75 MCG PO TABS: 75.0000 ug | ORAL_TABLET | Freq: Every day | ORAL | 0 refills | Status: DC

## 2018-03-16 MED ORDER — ASPIRIN EC 81 MG PO TBEC: 81.0000 mg | DELAYED_RELEASE_TABLET | Freq: Every day | ORAL | 3 refills | Status: DC

## 2018-03-16 MED ORDER — ROSUVASTATIN CALCIUM 40 MG PO TABS: 40.0000 mg | ORAL_TABLET | Freq: Every evening | ORAL | 1 refills | Status: DC

## 2018-03-16 NOTE — Patient Outreach (Addendum)
[facility]) Care Management  03/16/2018  KIORA HALLBERG 1950-12-12 [id number]   Contacted by Rogers Blocker at [facility]. They are getting ready to fill the next month of bubble packs for the patient, but need refills on aspirin, levothyroxine, and rosuvastatin.  Contacted PCP Dr. Ethel Rana office, spoke with Constance Holster; requested these refills be sent to [facility] on the patient's behalf.   Catie Darnelle Maffucci, PharmD PGY2 Ambulatory Care Pharmacy Resident, [facility] Phone: [phone]

## 2018-03-16 NOTE — Telephone Encounter (Signed)
Spoke with the patient, gave her the number for [place] to coordinate a new PCP that is close to her home.

## 2018-03-16 NOTE — Telephone Encounter (Signed)
Copied from [place] [id number]. Topic: General - Other >> Mar 16, 2018  1:29 PM Lennox Solders wrote: Reason for CRM: [facility] is calling the patient needs new rxs  [facility] asa 81 mg, rosuvastatin 40 mg and levothyroxine 75 mcg #90 w/refills

## 2018-03-16 NOTE — Telephone Encounter (Signed)
Rxs sent

## 2018-03-16 NOTE — Telephone Encounter (Signed)
Received Physician Orders from [facility]; forwarded to provider/SLS 11/05

## 2018-03-17 DIAGNOSIS — E1122 Type 2 diabetes mellitus with diabetic chronic kidney disease: Secondary | ICD-10-CM | POA: Diagnosis not present

## 2018-03-17 DIAGNOSIS — J449 Chronic obstructive pulmonary disease, unspecified: Secondary | ICD-10-CM | POA: Diagnosis not present

## 2018-03-17 DIAGNOSIS — J9611 Chronic respiratory failure with hypoxia: Secondary | ICD-10-CM | POA: Diagnosis not present

## 2018-03-17 DIAGNOSIS — Z96653 Presence of artificial knee joint, bilateral: Secondary | ICD-10-CM | POA: Diagnosis not present

## 2018-03-17 DIAGNOSIS — R1319 Other dysphagia: Secondary | ICD-10-CM | POA: Diagnosis not present

## 2018-03-17 DIAGNOSIS — I509 Heart failure, unspecified: Secondary | ICD-10-CM | POA: Diagnosis not present

## 2018-03-17 DIAGNOSIS — Z7951 Long term (current) use of inhaled steroids: Secondary | ICD-10-CM | POA: Diagnosis not present

## 2018-03-17 DIAGNOSIS — Z9981 Dependence on supplemental oxygen: Secondary | ICD-10-CM | POA: Diagnosis not present

## 2018-03-17 DIAGNOSIS — Z87891 Personal history of nicotine dependence: Secondary | ICD-10-CM | POA: Diagnosis not present

## 2018-03-17 DIAGNOSIS — Z7982 Long term (current) use of aspirin: Secondary | ICD-10-CM | POA: Diagnosis not present

## 2018-03-17 DIAGNOSIS — I130 Hypertensive heart and chronic kidney disease with heart failure and stage 1 through stage 4 chronic kidney disease, or unspecified chronic kidney disease: Secondary | ICD-10-CM | POA: Diagnosis not present

## 2018-03-17 DIAGNOSIS — N183 Chronic kidney disease, stage 3 (moderate): Secondary | ICD-10-CM | POA: Diagnosis not present

## 2018-03-17 DIAGNOSIS — E1136 Type 2 diabetes mellitus with diabetic cataract: Secondary | ICD-10-CM | POA: Diagnosis not present

## 2018-03-17 DIAGNOSIS — E785 Hyperlipidemia, unspecified: Secondary | ICD-10-CM | POA: Diagnosis not present

## 2018-03-17 DIAGNOSIS — Z87440 Personal history of urinary (tract) infections: Secondary | ICD-10-CM | POA: Diagnosis not present

## 2018-03-17 DIAGNOSIS — K219 Gastro-esophageal reflux disease without esophagitis: Secondary | ICD-10-CM | POA: Diagnosis not present

## 2018-03-17 DIAGNOSIS — G4733 Obstructive sleep apnea (adult) (pediatric): Secondary | ICD-10-CM | POA: Diagnosis not present

## 2018-03-17 DIAGNOSIS — D631 Anemia in chronic kidney disease: Secondary | ICD-10-CM | POA: Diagnosis not present

## 2018-03-17 DIAGNOSIS — M1991 Primary osteoarthritis, unspecified site: Secondary | ICD-10-CM | POA: Diagnosis not present

## 2018-03-17 DIAGNOSIS — K5792 Diverticulitis of intestine, part unspecified, without perforation or abscess without bleeding: Secondary | ICD-10-CM | POA: Diagnosis not present

## 2018-03-17 DIAGNOSIS — E039 Hypothyroidism, unspecified: Secondary | ICD-10-CM | POA: Diagnosis not present

## 2018-03-18 NOTE — Telephone Encounter (Signed)
Orders signed and faxed to [facility] at [phone]. Form sent for scanning.

## 2018-03-19 ENCOUNTER — Other Ambulatory Visit: Payer: Self-pay

## 2018-03-19 DIAGNOSIS — G4733 Obstructive sleep apnea (adult) (pediatric): Secondary | ICD-10-CM | POA: Diagnosis not present

## 2018-03-19 DIAGNOSIS — E785 Hyperlipidemia, unspecified: Secondary | ICD-10-CM | POA: Diagnosis not present

## 2018-03-19 DIAGNOSIS — Z7982 Long term (current) use of aspirin: Secondary | ICD-10-CM | POA: Diagnosis not present

## 2018-03-19 DIAGNOSIS — I130 Hypertensive heart and chronic kidney disease with heart failure and stage 1 through stage 4 chronic kidney disease, or unspecified chronic kidney disease: Secondary | ICD-10-CM | POA: Diagnosis not present

## 2018-03-19 DIAGNOSIS — D631 Anemia in chronic kidney disease: Secondary | ICD-10-CM | POA: Diagnosis not present

## 2018-03-19 DIAGNOSIS — Z87891 Personal history of nicotine dependence: Secondary | ICD-10-CM | POA: Diagnosis not present

## 2018-03-19 DIAGNOSIS — Z96653 Presence of artificial knee joint, bilateral: Secondary | ICD-10-CM | POA: Diagnosis not present

## 2018-03-19 DIAGNOSIS — Z87440 Personal history of urinary (tract) infections: Secondary | ICD-10-CM | POA: Diagnosis not present

## 2018-03-19 DIAGNOSIS — M1991 Primary osteoarthritis, unspecified site: Secondary | ICD-10-CM | POA: Diagnosis not present

## 2018-03-19 DIAGNOSIS — N183 Chronic kidney disease, stage 3 (moderate): Secondary | ICD-10-CM | POA: Diagnosis not present

## 2018-03-19 DIAGNOSIS — K5792 Diverticulitis of intestine, part unspecified, without perforation or abscess without bleeding: Secondary | ICD-10-CM | POA: Diagnosis not present

## 2018-03-19 DIAGNOSIS — E1122 Type 2 diabetes mellitus with diabetic chronic kidney disease: Secondary | ICD-10-CM | POA: Diagnosis not present

## 2018-03-19 DIAGNOSIS — Z9981 Dependence on supplemental oxygen: Secondary | ICD-10-CM | POA: Diagnosis not present

## 2018-03-19 DIAGNOSIS — K219 Gastro-esophageal reflux disease without esophagitis: Secondary | ICD-10-CM | POA: Diagnosis not present

## 2018-03-19 DIAGNOSIS — I509 Heart failure, unspecified: Secondary | ICD-10-CM | POA: Diagnosis not present

## 2018-03-19 DIAGNOSIS — Z7951 Long term (current) use of inhaled steroids: Secondary | ICD-10-CM | POA: Diagnosis not present

## 2018-03-19 DIAGNOSIS — E039 Hypothyroidism, unspecified: Secondary | ICD-10-CM | POA: Diagnosis not present

## 2018-03-19 DIAGNOSIS — E1136 Type 2 diabetes mellitus with diabetic cataract: Secondary | ICD-10-CM | POA: Diagnosis not present

## 2018-03-19 DIAGNOSIS — R1319 Other dysphagia: Secondary | ICD-10-CM | POA: Diagnosis not present

## 2018-03-19 DIAGNOSIS — J9611 Chronic respiratory failure with hypoxia: Secondary | ICD-10-CM | POA: Diagnosis not present

## 2018-03-19 DIAGNOSIS — J449 Chronic obstructive pulmonary disease, unspecified: Secondary | ICD-10-CM | POA: Diagnosis not present

## 2018-03-19 NOTE — Patient Outreach (Signed)
[facility]) Care Management  03/19/2018  ANNIKA SELKE 22-Oct-1950 [id number]   Follow up call to Ms. Navarrete to ensure receipt of Senior Wheels application that was mailed on 03/12/18. Ms. Lineback denied receipt of application and could not recall what the application was for.  BSW reminded her of conversation last week regarding transportation resources.  BSW educated her about the [employer] and reminded her that the application must be completed and mailed to [employer] [place] in order to use the service.  BSW offered again to submit application to [employer] but she declined.  BSW will mail application to Ms. Wonders again today and follow up with her to ensure receipt.   Ronn Melena, BSW Social Worker [phone]

## 2018-03-20 DIAGNOSIS — I2510 Atherosclerotic heart disease of native coronary artery without angina pectoris: Secondary | ICD-10-CM | POA: Diagnosis not present

## 2018-03-20 DIAGNOSIS — J9611 Chronic respiratory failure with hypoxia: Secondary | ICD-10-CM | POA: Diagnosis not present

## 2018-03-20 DIAGNOSIS — I509 Heart failure, unspecified: Secondary | ICD-10-CM | POA: Diagnosis not present

## 2018-03-23 ENCOUNTER — Other Ambulatory Visit: Payer: Self-pay

## 2018-03-23 NOTE — Patient Outreach (Signed)
[facility]) Care Management   03/23/2018  MOZETTA MURFIN [birth year] [id number]  [name] is an 67 y.o. female  Subjective: client reports she continues to wear oxygen as prescribed. She reports she is improved some over the past several months.  Objective:  BP 140/80   Pulse 74   Resp 20   Ht 1.753 m ('5\' 9"'$ ) Comment: patient reported.  Wt 200 lb (90.7 kg) Comment: patient reported.  SpO2 98%   BMI 29.53 kg/m   Review of Systems  Respiratory:       Lung sounds clear. O2 at 2ln/c on.  Cardiovascular:       S1S2 noted, regular    Physical Exam skin warm, dry. Color within normal limits  Encounter Medications:   Outpatient Encounter Medications as of 03/23/2018  Medication Sig Note  . ACCU-CHEK SOFTCLIX LANCETS lancets USE TO CHECK BLOOD SUGAR TWICE A DAY E11.9   . albuterol (PROVENTIL HFA;VENTOLIN HFA) 108 (90 Base) MCG/ACT inhaler Inhale 2 puffs into the lungs every 4 (four) hours as needed for wheezing.   Marland Kitchen aspirin EC 81 MG tablet Take 1 tablet (81 mg total) by mouth daily.   . Blood Glucose Monitoring Suppl (ACCU-CHEK AVIVA) device Use as instructed to check blood sugar twice daily dx E11.9   . escitalopram (LEXAPRO) 10 MG tablet Take 1 tablet (10 mg total) by mouth daily. 02/10/2018: Takes PRN  . famotidine (PEPCID) 20 MG tablet Take 1 tablet (20 mg total) by mouth daily as needed for heartburn or indigestion. 02/10/2018: PRN  . Fluticasone-Salmeterol (ADVAIR) 250-50 MCG/DOSE AEPB Inhale 2 puffs into the lungs daily.    Marland Kitchen glucose blood (ACCU-CHEK AVIVA PLUS) test strip TEST BLOD SUGAR TWICE DAILY AND LANCETS TWICE DAILY E11.9   . latanoprost (XALATAN) 0.005 % ophthalmic solution USE 1 DROP IN BOTH EYES AT BEDTIME (Patient taking differently: Place 1 drop into both eyes at bedtime. USE 1 DROP IN BOTH EYES AT BEDTIME)   . levothyroxine (SYNTHROID, LEVOTHROID) 75 MCG tablet Take 1 tablet (75 mcg total) by mouth daily before breakfast.   .  metoprolol tartrate (LOPRESSOR) 25 MG tablet Take 37.5 mg by mouth 2 (two) times daily. (1.5 tablets twice a day)   . ondansetron (ZOFRAN-ODT) 4 MG disintegrating tablet Take 1 tablet (4 mg total) by mouth every 8 (eight) hours as needed for nausea or vomiting.   Marland Kitchen OVER THE COUNTER MEDICATION CPAP   . OXYGEN Inhale 2 L into the lungs continuous.    . pantoprazole (PROTONIX) 40 MG tablet Take 1 tablet (40 mg total) by mouth 2 (two) times daily.   . potassium chloride (K-DUR) 10 MEQ tablet Take 1 tablet (10 mEq total) by mouth daily.   . rosuvastatin (CRESTOR) 40 MG tablet Take 1 tablet (40 mg total) by mouth every evening.   . sitaGLIPtin (JANUVIA) 50 MG tablet Take 1 tablet (50 mg total) by mouth daily.    No facility-administered encounter medications on file as of 03/23/2018.     Functional Status:   In your present state of health, do you have any difficulty performing the following activities: 02/10/2018 12/27/2017  Hearing? N -  Vision? N -  Difficulty concentrating or making decisions? N -  Walking or climbing stairs? Y -  Dressing or bathing? Y -  Doing errands, shopping? [place] and eating ? Y -  Using the Toilet? N -  In the past six months, have you  accidently leaked urine? Y -  Do you have problems with loss of bowel control? N -  Managing your Medications? Y -  Managing your Finances? N -  Housekeeping or managing your Housekeeping? Y -  Comment has personal care services. -  Some recent data might be hidden    Fall/Depression Screening:    Fall Risk  02/10/2018 10/26/2017 08/06/2017  Falls in the past year? Yes No Yes  Number falls in past yr: 2 or more - 1  Injury with Fall? Yes - No  Comment lower back pain - -  Risk Factor Category  High Fall Risk - -  Risk for fall due to : History of fall(s) - History of fall(s)  Risk for fall due to: Comment - - patient reports that she fell once last summer when she slipped during mopping her floor; no  loss of consiousness and no injury  Follow up Falls prevention discussed - Falls prevention discussed;Education provided   [facility] 2/9 Scores 02/10/2018 10/26/2017 08/06/2017 03/31/2017 03/26/2016 10/31/2015 02/22/2015  PHQ - 2 Score '3 3 1 2 '$ 0 0 0  PHQ- 9 Score 13 13 - 5 - - -  Exception Documentation - - - - - - Other- indicate reason in comment box  Not completed - - - - - - Pt's son passed in June 2016    Assessment:  89 year old with hsitory ofPericarditis,OSA, asthma, chornic hypoxia respiratory failure, HTN, DM.  [facility]/14-9/24[facility] skilled facility for rehabilitation. 8/18-8/21 postprandial nausea/vomiting-hospitalized and discharged to Skilled facility. 8/12-8/14 with chest pain.  7/9-7/12/57fr tachycardia 6/10-6/15 with acute respiratory failure with hypoxia.  RNCM completed home visit. Also present during visit was [employer] RNCM(orienting). Client with no new issues or complaints. She reports she continues to be short of breath based on the type and amount of activity she is doing. She continues to attend scheduled appointments and states her son is taking her to her next few appointments. She reports she is still active with home health and that her personal care service assistant is working out well.  RNCM discussed transition to health coach and client is agreeable for continued education/reinforcement of COPD management.  Plan:  Referral to health coach.  THN CM Care Plan Problem One     Most Recent Value  Care Plan Problem One  at risk for readmission as evidence by recent admission/skilled facility stay.  Role Documenting the Problem One  Care Management Coordinator  Care Plan for Problem One  Not Active  [facility]   client will not be readmitted within the next 31 days.  THN Long Term Goal Start Date  02/05/18  THN Long Term Goal Met Date  03/11/18  THN CM Short Term Goal #2   client will take medications as prescribed within the next 30  days.  THN CM Short Term Goal #2 Start Date  02/05/18  [facility] Goal #2 Met Date  02/25/18    [facility] Care Plan Problem Two     Most Recent Value  Care Plan Problem Two  increased blood pressure as evidence of BP greater than 140/90  Role Documenting the Problem Two  Care Management [place] Problem [place]  Not Active  THN CM Short Term Goal #1   client will verbalize blood pressure within normal limits within the next 30 days.   THN CM Short Term Goal #1 Start Date  02/10/18  [facility] Goal #1  Met Date   03/11/18    [facility] CM Care Plan Problem Three     Most Recent Value  Care Plan Problem Three  knowledge deficit regarding COPD management as evidence by client request for additional information.  Role Documenting the Problem Three  Care Management [place] for Problem Three  Active  THN Long Term Goal   client will verbalize at three COPD management strategies within the next 45-60 days.  [place] Long Term Goal Start Date  03/11/18      Thea Silversmith, RN, MSN, [place] Coordinator Cell: [phone]

## 2018-03-24 ENCOUNTER — Telehealth: Payer: Self-pay | Admitting: Internal Medicine

## 2018-03-24 ENCOUNTER — Ambulatory Visit (HOSPITAL_COMMUNITY)
Admission: RE | Admit: 2018-03-24 | Discharge: 2018-03-24 | Disposition: A | Discharge status: Home / Self Care | Payer: Medicare Other | Source: Ambulatory Visit | Attending: Cardiology | Admitting: Cardiology

## 2018-03-24 DIAGNOSIS — K219 Gastro-esophageal reflux disease without esophagitis: Secondary | ICD-10-CM | POA: Diagnosis not present

## 2018-03-24 DIAGNOSIS — I313 Pericardial effusion (noninflammatory): Secondary | ICD-10-CM | POA: Diagnosis not present

## 2018-03-24 DIAGNOSIS — Z87891 Personal history of nicotine dependence: Secondary | ICD-10-CM | POA: Diagnosis not present

## 2018-03-24 DIAGNOSIS — R700 Elevated erythrocyte sedimentation rate: Secondary | ICD-10-CM | POA: Diagnosis not present

## 2018-03-24 DIAGNOSIS — R079 Chest pain, unspecified: Secondary | ICD-10-CM | POA: Diagnosis not present

## 2018-03-24 DIAGNOSIS — I517 Cardiomegaly: Secondary | ICD-10-CM | POA: Insufficient documentation

## 2018-03-24 DIAGNOSIS — E1136 Type 2 diabetes mellitus with diabetic cataract: Secondary | ICD-10-CM | POA: Diagnosis not present

## 2018-03-24 DIAGNOSIS — Z7982 Long term (current) use of aspirin: Secondary | ICD-10-CM | POA: Diagnosis not present

## 2018-03-24 DIAGNOSIS — N183 Chronic kidney disease, stage 3 (moderate): Secondary | ICD-10-CM | POA: Diagnosis not present

## 2018-03-24 DIAGNOSIS — J9611 Chronic respiratory failure with hypoxia: Secondary | ICD-10-CM | POA: Diagnosis not present

## 2018-03-24 DIAGNOSIS — Z9981 Dependence on supplemental oxygen: Secondary | ICD-10-CM | POA: Diagnosis not present

## 2018-03-24 DIAGNOSIS — D631 Anemia in chronic kidney disease: Secondary | ICD-10-CM | POA: Diagnosis not present

## 2018-03-24 DIAGNOSIS — I319 Disease of pericardium, unspecified: Secondary | ICD-10-CM | POA: Diagnosis not present

## 2018-03-24 DIAGNOSIS — R7982 Elevated C-reactive protein (CRP): Secondary | ICD-10-CM | POA: Diagnosis not present

## 2018-03-24 DIAGNOSIS — I281 Aneurysm of pulmonary artery: Secondary | ICD-10-CM | POA: Diagnosis not present

## 2018-03-24 DIAGNOSIS — R1319 Other dysphagia: Secondary | ICD-10-CM | POA: Diagnosis not present

## 2018-03-24 DIAGNOSIS — Z87440 Personal history of urinary (tract) infections: Secondary | ICD-10-CM | POA: Diagnosis not present

## 2018-03-24 DIAGNOSIS — E1122 Type 2 diabetes mellitus with diabetic chronic kidney disease: Secondary | ICD-10-CM | POA: Diagnosis not present

## 2018-03-24 DIAGNOSIS — K5792 Diverticulitis of intestine, part unspecified, without perforation or abscess without bleeding: Secondary | ICD-10-CM | POA: Diagnosis not present

## 2018-03-24 DIAGNOSIS — E039 Hypothyroidism, unspecified: Secondary | ICD-10-CM | POA: Diagnosis not present

## 2018-03-24 DIAGNOSIS — I130 Hypertensive heart and chronic kidney disease with heart failure and stage 1 through stage 4 chronic kidney disease, or unspecified chronic kidney disease: Secondary | ICD-10-CM | POA: Diagnosis not present

## 2018-03-24 DIAGNOSIS — J449 Chronic obstructive pulmonary disease, unspecified: Secondary | ICD-10-CM | POA: Diagnosis not present

## 2018-03-24 DIAGNOSIS — Z96653 Presence of artificial knee joint, bilateral: Secondary | ICD-10-CM | POA: Diagnosis not present

## 2018-03-24 DIAGNOSIS — Z7951 Long term (current) use of inhaled steroids: Secondary | ICD-10-CM | POA: Diagnosis not present

## 2018-03-24 DIAGNOSIS — E785 Hyperlipidemia, unspecified: Secondary | ICD-10-CM | POA: Diagnosis not present

## 2018-03-24 DIAGNOSIS — M1991 Primary osteoarthritis, unspecified site: Secondary | ICD-10-CM | POA: Diagnosis not present

## 2018-03-24 DIAGNOSIS — I509 Heart failure, unspecified: Secondary | ICD-10-CM | POA: Diagnosis not present

## 2018-03-24 DIAGNOSIS — G4733 Obstructive sleep apnea (adult) (pediatric): Secondary | ICD-10-CM | POA: Diagnosis not present

## 2018-03-24 MED ORDER — GADOBUTROL 1 MMOL/ML IV SOLN
10.0000 mL | Freq: Once | INTRAVENOUS | Status: AC | PRN
  Administered 2018-03-24: 10 mL via INTRAVENOUS

## 2018-03-24 NOTE — Telephone Encounter (Signed)
LMOM for Jim w/ verbal orders. 

## 2018-03-24 NOTE — Telephone Encounter (Signed)
Copied from [place] [id number]. Topic: Quick Communication - Home Health Verbal Orders >> Mar 24, 2018  4:19 PM Percell Belt A wrote: Caller/Agency: Costella Hatcher with [facility] Number: [phone] Requesting OT/PT/Skilled Nursing/Social Work:  need verbals to extend OT  Frequency:  2 week 2

## 2018-03-25 ENCOUNTER — Telehealth: Payer: Self-pay | Admitting: *Deleted

## 2018-03-25 NOTE — Telephone Encounter (Signed)
Received Client Coordination Note Report for review; forwarded to provider/SLS 11/14

## 2018-03-26 ENCOUNTER — Other Ambulatory Visit: Payer: Self-pay | Admitting: *Deleted

## 2018-03-26 MED ORDER — COLCHICINE 0.6 MG PO TABS: 0.6000 mg | ORAL_TABLET | Freq: Two times a day (BID) | ORAL | 6 refills | Status: DC

## 2018-03-26 MED ORDER — IBUPROFEN 600 MG PO TABS: 600.0000 mg | ORAL_TABLET | Freq: Three times a day (TID) | ORAL | 0 refills | Status: DC

## 2018-03-26 MED ORDER — PANTOPRAZOLE SODIUM 40 MG PO TBEC: 40.0000 mg | DELAYED_RELEASE_TABLET | Freq: Every day | ORAL | 6 refills | Status: DC

## 2018-03-26 NOTE — Progress Notes (Signed)
NEW MED ADDED

## 2018-03-29 ENCOUNTER — Other Ambulatory Visit: Payer: Self-pay

## 2018-03-29 DIAGNOSIS — I3139 Other pericardial effusion (noninflammatory): Secondary | ICD-10-CM

## 2018-03-29 DIAGNOSIS — I313 Pericardial effusion (noninflammatory): Secondary | ICD-10-CM

## 2018-03-30 ENCOUNTER — Other Ambulatory Visit: Payer: Self-pay

## 2018-03-30 DIAGNOSIS — E1122 Type 2 diabetes mellitus with diabetic chronic kidney disease: Secondary | ICD-10-CM | POA: Diagnosis not present

## 2018-03-30 DIAGNOSIS — Z87891 Personal history of nicotine dependence: Secondary | ICD-10-CM | POA: Diagnosis not present

## 2018-03-30 DIAGNOSIS — Z87440 Personal history of urinary (tract) infections: Secondary | ICD-10-CM | POA: Diagnosis not present

## 2018-03-30 DIAGNOSIS — Z96653 Presence of artificial knee joint, bilateral: Secondary | ICD-10-CM | POA: Diagnosis not present

## 2018-03-30 DIAGNOSIS — H04123 Dry eye syndrome of bilateral lacrimal glands: Secondary | ICD-10-CM | POA: Diagnosis not present

## 2018-03-30 DIAGNOSIS — M1991 Primary osteoarthritis, unspecified site: Secondary | ICD-10-CM | POA: Diagnosis not present

## 2018-03-30 DIAGNOSIS — I509 Heart failure, unspecified: Secondary | ICD-10-CM | POA: Diagnosis not present

## 2018-03-30 DIAGNOSIS — D631 Anemia in chronic kidney disease: Secondary | ICD-10-CM | POA: Diagnosis not present

## 2018-03-30 DIAGNOSIS — J9611 Chronic respiratory failure with hypoxia: Secondary | ICD-10-CM | POA: Diagnosis not present

## 2018-03-30 DIAGNOSIS — H10413 Chronic giant papillary conjunctivitis, bilateral: Secondary | ICD-10-CM | POA: Diagnosis not present

## 2018-03-30 DIAGNOSIS — Z7951 Long term (current) use of inhaled steroids: Secondary | ICD-10-CM | POA: Diagnosis not present

## 2018-03-30 DIAGNOSIS — Z9981 Dependence on supplemental oxygen: Secondary | ICD-10-CM | POA: Diagnosis not present

## 2018-03-30 DIAGNOSIS — H25813 Combined forms of age-related cataract, bilateral: Secondary | ICD-10-CM | POA: Diagnosis not present

## 2018-03-30 DIAGNOSIS — E119 Type 2 diabetes mellitus without complications: Secondary | ICD-10-CM | POA: Diagnosis not present

## 2018-03-30 DIAGNOSIS — E785 Hyperlipidemia, unspecified: Secondary | ICD-10-CM | POA: Diagnosis not present

## 2018-03-30 DIAGNOSIS — E039 Hypothyroidism, unspecified: Secondary | ICD-10-CM | POA: Diagnosis not present

## 2018-03-30 DIAGNOSIS — J449 Chronic obstructive pulmonary disease, unspecified: Secondary | ICD-10-CM | POA: Diagnosis not present

## 2018-03-30 DIAGNOSIS — N183 Chronic kidney disease, stage 3 (moderate): Secondary | ICD-10-CM | POA: Diagnosis not present

## 2018-03-30 DIAGNOSIS — H401112 Primary open-angle glaucoma, right eye, moderate stage: Secondary | ICD-10-CM | POA: Diagnosis not present

## 2018-03-30 DIAGNOSIS — K5792 Diverticulitis of intestine, part unspecified, without perforation or abscess without bleeding: Secondary | ICD-10-CM | POA: Diagnosis not present

## 2018-03-30 DIAGNOSIS — K219 Gastro-esophageal reflux disease without esophagitis: Secondary | ICD-10-CM | POA: Diagnosis not present

## 2018-03-30 DIAGNOSIS — E1136 Type 2 diabetes mellitus with diabetic cataract: Secondary | ICD-10-CM | POA: Diagnosis not present

## 2018-03-30 DIAGNOSIS — Z7982 Long term (current) use of aspirin: Secondary | ICD-10-CM | POA: Diagnosis not present

## 2018-03-30 DIAGNOSIS — R1319 Other dysphagia: Secondary | ICD-10-CM | POA: Diagnosis not present

## 2018-03-30 DIAGNOSIS — G4733 Obstructive sleep apnea (adult) (pediatric): Secondary | ICD-10-CM | POA: Diagnosis not present

## 2018-03-30 DIAGNOSIS — I130 Hypertensive heart and chronic kidney disease with heart failure and stage 1 through stage 4 chronic kidney disease, or unspecified chronic kidney disease: Secondary | ICD-10-CM | POA: Diagnosis not present

## 2018-03-30 LAB — HM DIABETES EYE EXAM

## 2018-03-30 NOTE — Patient Outreach (Signed)
[facility]) Care Management  03/30/2018  ANESSIA OAKLAND 10/18/1950 [id number]   Follow up call to Ms. Parisi to ensure receipt of Senior Wheels application that was mailed for the second time on 03/19/18.  Ms. Lober said that she has not been able to check her mail in the last week so she was unsure if it was received.  She reported that her son is providing transportation to her next few MD appointments.  BSW is closing case at this time but encouraged Ms. Edgin to call if she did not receive application or if any additional needs arise.   Ronn Melena, BSW Social Worker [phone]

## 2018-03-31 NOTE — Progress Notes (Addendum)
Subjective:   [name] is a 67 y.o. female who presents for Medicare Annual/Subsequent preventive examination.  Review of Systems: No ROS.  Medicare Wellness Visit. Additional risk factors are reflected in the social history. Cardiac Risk Factors include: advanced age (>42men, >28 women);dyslipidemia;diabetes mellitus;hypertension Sleep patterns: wearing CPAP at night.  Home Safety/Smoke Alarms: Feels safe in home. Smoke alarms in place. Lives in apt alone. Sons comes to check almost everyday. Wearing safety necklace. [place] aide everyday 2-3 hrs/ day.  Female:   Mammo- declines      Dexa scan- declines       CCS- next due 07/2021 Eye- pt reports DM eye exam yesterday.    Objective:    Vitals: BP 140/82 (BP Location: Left Arm, Patient Position: Sitting, Cuff Size: Normal)   Pulse 81   Ht 5\' 9"  (1.753 m)   Wt 196 lb (88.9 kg)   SpO2 98%   BMI 28.94 kg/m   Body mass index is 28.94 kg/m.  Advanced Directives 04/01/2018 12/28/2017 12/27/2017 12/22/2017 12/21/2017 12/03/2017 12/03/2017  Does Patient Have a Medical Advance Directive? No No No No No No No  Would patient like information on creating a medical advance directive? Yes (MAU/Ambulatory/Procedural Areas - Information given) No - Patient declined No - Patient declined No - Patient declined - - No - Patient declined  Pre-existing out of facility DNR order (yellow form or pink MOST form) - - - - - - -    Tobacco Social History   Tobacco Use  Smoking Status Former Smoker  . Packs/day: 0.20  . Years: 4.00  . Pack years: 0.80  . Types: Cigarettes  . Last attempt to quit: 05/13/1995  . Years since quitting: 22.9  Smokeless Tobacco Never Used     Counseling given: Not Answered   Clinical Intake:     Pain : No/denies pain   Past Medical History:  Diagnosis Date  . Anterolisthesis    Grade 1, L4-5  . Bronchospasm 05/28/2012  . CHEST PAIN 11/18/2007  . DEGENERATIVE JOINT DISEASE 10/06/2006  . DEPRESSION 09/26/2008  .  DIABETES MELLITUS 10/06/2006  . Diverticulosis    8299,[id number]  . GERD (gastroesophageal reflux disease) 07/25/2013  . HYPERLIPIDEMIA 01/11/2009  . HYPERTENSION 10/06/2006  . INSOMNIA 09/26/2008  . Internal hemorrhoids   . OBSTRUCTIVE SLEEP APNEA 06/23/2008   Severe OSA per sleep study 2010, Rx a CPAP  . Pain in joint, multiple sites 11/10/2006  . UNSPECIFIED ANEMIA 12/10/2009  . UTI (urinary tract infection) 11/2017   Past Surgical History:  Procedure Laterality Date  . BIOPSY  12/28/2017   Procedure: BIOPSY;  Surgeon: Irving Copas., MD;  Location: [facility];  Service: Gastroenterology;;  . COLONOSCOPY  08/01/2011   Procedure: COLONOSCOPY;  Surgeon: Inda Castle, MD;  Location: [facility];  Service: Endoscopy;  Laterality: N/A;  . ESOPHAGOGASTRODUODENOSCOPY (EGD) WITH PROPOFOL N/A 12/28/2017   Procedure: ESOPHAGOGASTRODUODENOSCOPY (EGD) WITH PROPOFOL;  Surgeon: Rush Landmark Telford Nab., MD;  Location: [facility];  Service: Gastroenterology;  Laterality: N/A;  . LEFT HEART CATH AND CORONARY ANGIOGRAPHY N/A 10/22/2017   Procedure: LEFT HEART CATH AND CORONARY ANGIOGRAPHY;  Surgeon: Jettie Booze, MD;  Location: [facility];  Service: Cardiovascular;  Laterality: N/A;  . 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 Right knee replacement  2005   Family History  Problem Relation Age of Onset  . Asthma Mother   . Stroke Mother   . Diabetes Other  M, B, S  . Hypertension Sister        M, S,B  . Pancreatic cancer Brother   . Colon cancer Neg Hx   . Prostate cancer Neg Hx   . Breast cancer Neg Hx   . Esophageal cancer Neg Hx   . Liver disease Neg Hx   . Rectal cancer Neg Hx   . Stomach cancer Neg Hx   . Inflammatory bowel disease Neg Hx    Social History   Socioeconomic History  . Marital status: Widowed    Spouse name: Not on file  . Number of children: 4  . Years of education: Not on file  . Highest education level: Not on file  Occupational History  .  Occupation: disability    Employer: UNEMPLOYED  Social Needs  . Financial resource strain: Not on file  . Food insecurity:    Worry: Not on file    Inability: Not on file  . Transportation needs:    Medical: Not on file    Non-medical: Not on file  Tobacco Use  . Smoking status: Former Smoker    Packs/day: 0.20    Years: 4.00    Pack years: 0.80    Types: Cigarettes    Last attempt to quit: 05/13/1995    Years since quitting: 22.9  . Smokeless tobacco: Never Used  Substance and Sexual Activity  . Alcohol use: Not Currently  . Drug use: No  . Sexual activity: Not Currently  Lifestyle  . Physical activity:    Days per week: Not on file    Minutes per session: Not on file  . Stress: Not on file  Relationships  . Social connections:    Talks on phone: Not on file    Gets together: Not on file    Attends religious service: Not on file    Active member of club or organization: Not on file    Attends meetings of clubs or organizations: Not on file    Relationship status: Not on file  Other Topics Concern  . Not on file  Social History Narrative   Widow , lives by herself   Lost a son, 3 living    Lost husband    Outpatient Encounter Medications as of 04/01/2018  Medication Sig  . ACCU-CHEK SOFTCLIX LANCETS lancets USE TO CHECK BLOOD SUGAR TWICE A DAY E11.9  . albuterol (PROVENTIL HFA;VENTOLIN HFA) 108 (90 Base) MCG/ACT inhaler Inhale 2 puffs into the lungs every 4 (four) hours as needed for wheezing.  Marland Kitchen aspirin EC 81 MG tablet Take 1 tablet (81 mg total) by mouth daily.  . Blood Glucose Monitoring Suppl (ACCU-CHEK AVIVA) device Use as instructed to check blood sugar twice daily dx E11.9  . colchicine 0.6 MG tablet Take 1 tablet (0.6 mg total) by mouth 2 (two) times daily.  Marland Kitchen escitalopram (LEXAPRO) 10 MG tablet Take 1 tablet (10 mg total) by mouth daily.  . famotidine (PEPCID) 20 MG tablet Take 1 tablet (20 mg total) by mouth daily as needed for heartburn or indigestion.  .  Fluticasone-Salmeterol (ADVAIR) 250-50 MCG/DOSE AEPB Inhale 2 puffs into the lungs daily.   Marland Kitchen glucose blood (ACCU-CHEK AVIVA PLUS) test strip TEST BLOD SUGAR TWICE DAILY AND LANCETS TWICE DAILY E11.9  . ibuprofen (ADVIL,MOTRIN) 600 MG tablet Take 1 tablet (600 mg total) by mouth 3 (three) times daily. FOR 2 WEEKS  . latanoprost (XALATAN) 0.005 % ophthalmic solution USE 1 DROP IN BOTH EYES AT BEDTIME  .  levothyroxine (SYNTHROID, LEVOTHROID) 75 MCG tablet Take 1 tablet (75 mcg total) by mouth daily before breakfast.  . metoprolol tartrate (LOPRESSOR) 25 MG tablet Take 37.5 mg by mouth 2 (two) times daily. (1.5 tablets twice a day)  . ondansetron (ZOFRAN-ODT) 4 MG disintegrating tablet Take 1 tablet (4 mg total) by mouth every 8 (eight) hours as needed for nausea or vomiting.  Marland Kitchen OVER THE COUNTER MEDICATION CPAP  . OXYGEN Inhale 2 L into the lungs continuous.   . pantoprazole (PROTONIX) 40 MG tablet Take 1 tablet (40 mg total) by mouth daily.  . potassium chloride (K-DUR) 10 MEQ tablet Take 1 tablet (10 mEq total) by mouth daily.  . rosuvastatin (CRESTOR) 40 MG tablet Take 1 tablet (40 mg total) by mouth every evening.  . sitaGLIPtin (JANUVIA) 50 MG tablet Take 1 tablet (50 mg total) by mouth daily.  . [DISCONTINUED] pantoprazole (PROTONIX) 40 MG tablet Take 1 tablet (40 mg total) by mouth 2 (two) times daily.   No facility-administered encounter medications on file as of 04/01/2018.     Activities of Daily Living In your present state of health, do you have any difficulty performing the following activities: 04/01/2018 02/10/2018  Hearing? N N  Vision? N N  Difficulty concentrating or making decisions? Y N  Walking or climbing stairs? Y Y  Dressing or bathing? Y Y  Doing errands, shopping? [name]  Preparing Food and eating ? Y Y  Using the Toilet? Y N  Comment has BSC -  In the past six months, have you accidently leaked urine? [name]  Comment wears pull ups -  Do you have problems with loss of  bowel control? Y N  Managing your Medications? Y Y  Managing your Finances? Y N  Housekeeping or managing your Housekeeping? [name]  Comment - has personal care services.  Some recent data might be hidden    Patient Care Team: Colon Branch, MD as PCP - General Sueanne Margarita, MD as PCP - Cardiology (Cardiology) Renato Shin, MD as Consulting Physician (Endocrinology) Clance, Armando Reichert, MD as Consulting Physician (Pulmonary Disease) Clent Jacks, MD as Consulting Physician (Ophthalmology) Berle Mull, MD as Consulting Physician (Family Medicine) Phylliss Bob, MD as Consulting Physician (Orthopedic Surgery) Melissa Montane, MD as Consulting Physician (Otolaryngology) Lazaro Arms, RN as [facility]   Assessment:   This is a routine wellness examination for [place]. Physical assessment deferred to PCP.  Exercise Activities and Dietary recommendations Current Exercise Habits: The patient does not participate in regular exercise at present, Exercise limited by: None identified Diet (meal preparation, eat out, water intake, caffeinated beverages, dairy products, fruits and vegetables): well balanced, on average, 2 meals per day   Goals    . Increase physical activity (pt-stated)     Walk the hall 3x/week.       Fall Risk Fall Risk  04/01/2018 02/10/2018 10/26/2017 08/06/2017 03/31/2017  Falls in the past year? 1 Yes No Yes Yes  Number falls in past yr: 0 2 or more - 1 1  Injury with Fall? 1 Yes - No No  Comment - lower back pain - - -  Risk Factor Category  - High Fall Risk - - -  Risk for fall due to : Impaired balance/gait History of fall(s) - History of fall(s) -  Risk for fall due to: Comment - - - patient reports that she fell once last summer when she slipped during mopping her floor; no loss  of consiousness and no injury -  Follow up Education provided;Falls prevention discussed Falls prevention discussed - Falls prevention discussed;Education  provided Education provided;Falls prevention discussed    Depression Screen PHQ 2/9 Scores 04/01/2018 02/10/2018 10/26/2017 08/06/2017  PHQ - 2 Score 0 3 3 1   PHQ- 9 Score - 13 13 -  Exception Documentation - - - -  Not completed - - - -    Cognitive Function MMSE - Mini Mental State Exam 04/01/2018 03/31/2017  Not completed: Refused -  Orientation to time - 5  Orientation to Place - 5  Registration - 3  Attention/ Calculation - 5  Recall - 3  Language- name 2 objects - 2  Language- repeat - 1  Language- follow 3 step command - 3  Language- read & follow direction - 1  Write a sentence - 1  Copy design - 1  Total score - 30        Immunization History  Administered Date(s) Administered  . Influenza Whole 05/12/2006, 03/15/2007  . Influenza, High Dose Seasonal PF 02/22/2015, 02/25/2016, 03/31/2017, 02/24/2018  . Influenza, Seasonal, Injecte, Preservative Fre 05/28/2012  . Influenza,inj,Quad PF,6+ Mos 05/12/2012, 04/25/2013, 06/01/2014  . Pneumococcal Conjugate-13 10/26/2013  . Pneumococcal Polysaccharide-23 05/28/2012  . Td 08/17/2006, 03/31/2017  . Zoster 10/29/2012   Screening Tests Health Maintenance  Topic Date Due  . MAMMOGRAM  10/29/2017  . OPHTHALMOLOGY EXAM  11/14/2017  . PNA vac Low Risk Adult (2 of 2 - PPSV23) 10/27/2018 (Originally 05/28/2017)  . COLONOSCOPY  08/01/2018  . HEMOGLOBIN A1C  08/26/2018  . FOOT EXAM  12/05/2018  . TETANUS/TDAP  04/01/2027  . INFLUENZA VACCINE  Completed  . DEXA SCAN  Completed  . Hepatitis C Screening  Completed      Plan:    Please schedule your next medicare wellness visit with me in 1 yr.  Continue to eat heart healthy diet (full of fruits, vegetables, whole grains, lean protein, water--limit salt, fat, and sugar intake) and increase physical activity as tolerated.  Continue doing brain stimulating activities (puzzles, reading, adult coloring books, staying active) to keep memory sharp.    I have personally  reviewed and noted the following in the patient's chart:   . Medical and social history . Use of alcohol, tobacco or illicit drugs  . Current medications and supplements . Functional ability and status . Nutritional status . Physical activity . Advanced directives . List of other physicians . Hospitalizations, surgeries, and ER visits in previous 12 months . Vitals . Screenings to include cognitive, depression, and falls . Referrals and appointments  In addition, I have reviewed and discussed with patient certain preventive protocols, quality metrics, and best practice recommendations. A written personalized care plan for preventive services as well as general preventive health recommendations were provided to patient.     Naaman Pl[place], [place]  04/01/2018  Kathlene November, MD

## 2018-04-01 ENCOUNTER — Encounter: Payer: Self-pay | Admitting: *Deleted

## 2018-04-01 ENCOUNTER — Ambulatory Visit (INDEPENDENT_AMBULATORY_CARE_PROVIDER_SITE_OTHER): Payer: Medicare Other | Admitting: *Deleted

## 2018-04-01 ENCOUNTER — Ambulatory Visit: Payer: Medicare Other | Admitting: *Deleted

## 2018-04-01 VITALS — BP 140/82 | HR 81 | Ht 69.0 in | Wt 196.0 lb

## 2018-04-01 DIAGNOSIS — Z0000 Encounter for general adult medical examination without abnormal findings: Secondary | ICD-10-CM

## 2018-04-01 DIAGNOSIS — E876 Hypokalemia: Secondary | ICD-10-CM | POA: Diagnosis not present

## 2018-04-01 LAB — BASIC METABOLIC PANEL
BUN: 5 mg/dL — AB (ref 6–23)
CO2: 30 mEq/L (ref 19–32)
Calcium: 9.2 mg/dL (ref 8.4–10.5)
Chloride: 99 mEq/L (ref 96–112)
Creatinine, Ser: 0.77 mg/dL (ref 0.40–1.20)
GFR: 95.59 mL/min (ref >60.00)
Glucose, Bld: 92 mg/dL (ref 70–99)
POTASSIUM: 3 meq/L — AB (ref 3.5–5.1)
Sodium: 138 mEq/L (ref 135–145)

## 2018-04-01 NOTE — Addendum Note (Signed)
Addended by: Caffie Pinto on: 04/01/2018 03:03 PM   Modules accepted: Orders

## 2018-04-01 NOTE — Patient Instructions (Signed)
Please schedule your next medicare wellness visit with me in 1 yr.  Continue to eat heart healthy diet (full of fruits, vegetables, whole grains, lean protein, water--limit salt, fat, and sugar intake) and increase physical activity as tolerated.  Continue doing brain stimulating activities (puzzles, reading, adult coloring books, staying active) to keep memory sharp.    [name] , Thank you for taking time to come for your Medicare Wellness Visit. I appreciate your ongoing commitment to your health goals. Please review the following plan we discussed and let me know if I can assist you in the future.   These are the goals we discussed: Goals    . Increase physical activity (pt-stated)     Walk the hall 3x/week.       This is a list of the screening recommended for you and due dates:  Health Maintenance  Topic Date Due  . Mammogram  10/29/2017  . Eye exam for diabetics  11/14/2017  . Pneumonia vaccines (2 of 2 - PPSV23) 10/27/2018*  . Colon Cancer Screening  08/01/2018  . Hemoglobin A1C  08/26/2018  . Complete foot exam   12/05/2018  . Tetanus Vaccine  04/01/2027  . Flu Shot  Completed  . DEXA scan (bone density measurement)  Completed  .  Hepatitis C: One time screening is recommended by Center for Disease Control  (CDC) for  adults born from [birth year] through 1967.   Completed  *Topic was postponed. The date shown is not the original due date.    Health Maintenance for Postmenopausal Women Menopause is a normal process in which your reproductive ability comes to an end. This process happens gradually over a span of months to years, usually between the ages of 63 and 31. Menopause is complete when you have missed 12 consecutive menstrual periods. It is important to talk with your health care provider about some of the most common conditions that affect postmenopausal women, such as heart disease, cancer, and bone loss (osteoporosis). Adopting a healthy lifestyle and getting  preventive care can help to promote your health and wellness. Those actions can also lower your chances of developing some of these common conditions. What should I know about menopause? During menopause, you may experience a number of symptoms, such as:  Moderate-to-severe hot flashes.  Night sweats.  Decrease in sex drive.  Mood swings.  Headaches.  Tiredness.  Irritability.  Memory problems.  Insomnia.  Choosing to treat or not to treat menopausal changes is an individual decision that you make with your health care provider. What should I know about hormone replacement therapy and supplements? Hormone therapy products are effective for treating symptoms that are associated with menopause, such as hot flashes and night sweats. Hormone replacement carries certain risks, especially as you become older. If you are thinking about using estrogen or estrogen with progestin treatments, discuss the benefits and risks with your health care provider. What should I know about heart disease and stroke? Heart disease, heart attack, and stroke become more likely as you age. This may be due, in part, to the hormonal changes that your body experiences during menopause. These can affect how your body processes dietary fats, triglycerides, and cholesterol. Heart attack and stroke are both medical emergencies. There are many things that you can do to help prevent heart disease and stroke:  Have your blood pressure checked at least every 1-2 years. High blood pressure causes heart disease and increases the risk of stroke.  If you are 55-79 years  old, ask your health care provider if you should take aspirin to prevent a heart attack or a stroke.  Do not use any tobacco products, including cigarettes, chewing tobacco, or electronic cigarettes. If you need help quitting, ask your health care provider.  It is important to eat a healthy diet and maintain a healthy weight. ? Be sure to include plenty of  vegetables, fruits, low-fat dairy products, and lean protein. ? Avoid eating foods that are high in solid fats, added sugars, or salt (sodium).  Get regular exercise. This is one of the most important things that you can do for your health. ? Try to exercise for at least 150 minutes each week. The type of exercise that you do should increase your heart rate and make you sweat. This is known as moderate-intensity exercise. ? Try to do strengthening exercises at least twice each week. Do these in addition to the moderate-intensity exercise.  Know your numbers.Ask your health care provider to check your cholesterol and your blood glucose. Continue to have your blood tested as directed by your health care provider.  What should I know about cancer screening? There are several types of cancer. Take the following steps to reduce your risk and to catch any cancer development as early as possible. Breast Cancer  Practice breast self-awareness. ? This means understanding how your breasts normally appear and feel. ? It also means doing regular breast self-exams. Let your health care provider know about any changes, no matter how small.  If you are 68 or older, have a clinician do a breast exam (clinical breast exam or CBE) every year. Depending on your age, family history, and medical history, it may be recommended that you also have a yearly breast X-ray (mammogram).  If you have a family history of breast cancer, talk with your health care provider about genetic screening.  If you are at high risk for breast cancer, talk with your health care provider about having an MRI and a mammogram every year.  Breast cancer (BRCA) gene test is recommended for women who have family members with BRCA-related cancers. Results of the assessment will determine the need for genetic counseling and BRCA1 and for BRCA2 testing. BRCA-related cancers include these types: ? Breast. This occurs in males or  females. ? Ovarian. ? Tubal. This may also be called fallopian tube cancer. ? Cancer of the abdominal or pelvic lining (peritoneal cancer). ? Prostate. ? Pancreatic.  Cervical, Uterine, and Ovarian Cancer Your health care provider may recommend that you be screened regularly for cancer of the pelvic organs. These include your ovaries, uterus, and vagina. This screening involves a pelvic exam, which includes checking for microscopic changes to the surface of your cervix (Pap test).  For women ages 21-65, health care providers may recommend a pelvic exam and a Pap test every three years. For women ages 31-65, they may recommend the Pap test and pelvic exam, combined with testing for human papilloma virus (HPV), every five years. Some types of HPV increase your risk of cervical cancer. Testing for HPV may also be done on women of any age who have unclear Pap test results.  Other health care providers may not recommend any screening for nonpregnant women who are considered low risk for pelvic cancer and have no symptoms. Ask your health care provider if a screening pelvic exam is right for you.  If you have had past treatment for cervical cancer or a condition that could lead to cancer, you need  Pap tests and screening for cancer for at least 20 years after your treatment. If Pap tests have been discontinued for you, your risk factors (such as having a new sexual partner) need to be reassessed to determine if you should start having screenings again. Some women have medical problems that increase the chance of getting cervical cancer. In these cases, your health care provider may recommend that you have screening and Pap tests more often.  If you have a family history of uterine cancer or ovarian cancer, talk with your health care provider about genetic screening.  If you have vaginal bleeding after reaching menopause, tell your health care provider.  There are currently no reliable tests available  to screen for ovarian cancer.  Lung Cancer Lung cancer screening is recommended for adults 61-58 years old who are at high risk for lung cancer because of a history of smoking. A yearly low-dose CT scan of the lungs is recommended if you:  Currently smoke.  Have a history of at least 30 pack-years of smoking and you currently smoke or have quit within the past 15 years. A pack-year is smoking an average of one pack of cigarettes per day for one year.  Yearly screening should:  Continue until it has been 15 years since you quit.  Stop if you develop a health problem that would prevent you from having lung cancer treatment.  Colorectal Cancer  This type of cancer can be detected and can often be prevented.  Routine colorectal cancer screening usually begins at age 27 and continues through age 86.  If you have risk factors for colon cancer, your health care provider may recommend that you be screened at an earlier age.  If you have a family history of colorectal cancer, talk with your health care provider about genetic screening.  Your health care provider may also recommend using home test kits to check for hidden blood in your stool.  A small camera at the end of a tube can be used to examine your colon directly (sigmoidoscopy or colonoscopy). This is done to check for the earliest forms of colorectal cancer.  Direct examination of the colon should be repeated every 5-10 years until age 68. However, if early forms of precancerous polyps or small growths are found or if you have a family history or genetic risk for colorectal cancer, you may need to be screened more often.  Skin Cancer  Check your skin from head to toe regularly.  Monitor any moles. Be sure to tell your health care provider: ? About any new moles or changes in moles, especially if there is a change in a mole's shape or color. ? If you have a mole that is larger than the size of a pencil eraser.  If any of your  family members has a history of skin cancer, especially at a young age, talk with your health care provider about genetic screening.  Always use sunscreen. Apply sunscreen liberally and repeatedly throughout the day.  Whenever you are outside, protect yourself by wearing long sleeves, pants, a wide-brimmed hat, and sunglasses.  What should I know about osteoporosis? Osteoporosis is a condition in which bone destruction happens more quickly than new bone creation. After menopause, you may be at an increased risk for osteoporosis. To help prevent osteoporosis or the bone fractures that can happen because of osteoporosis, the following is recommended:  If you are 60-89 years old, get at least 1,000 mg of calcium and at least 600  mg of vitamin D per day.  If you are older than age 75 but younger than age 9, get at least 1,200 mg of calcium and at least 600 mg of vitamin D per day.  If you are older than age 9, get at least 1,200 mg of calcium and at least 800 mg of vitamin D per day.  Smoking and excessive alcohol intake increase the risk of osteoporosis. Eat foods that are rich in calcium and vitamin D, and do weight-bearing exercises several times each week as directed by your health care provider. What should I know about how menopause affects my mental health? Depression may occur at any age, but it is more common as you become older. Common symptoms of depression include:  Low or sad mood.  Changes in sleep patterns.  Changes in appetite or eating patterns.  Feeling an overall lack of motivation or enjoyment of activities that you previously enjoyed.  Frequent crying spells.  Talk with your health care provider if you think that you are experiencing depression. What should I know about immunizations? It is important that you get and maintain your immunizations. These include:  Tetanus, diphtheria, and pertussis (Tdap) booster vaccine.  Influenza every year before the flu season  begins.  Pneumonia vaccine.  Shingles vaccine.  Your health care provider may also recommend other immunizations. This information is not intended to replace advice given to you by your health care provider. Make sure you discuss any questions you have with your health care provider. Document Released: 06/20/2005 Document Revised: 11/16/2015 Document Reviewed: 01/30/2015 Elsevier Interactive Patient Education  2018 [employer].

## 2018-04-02 DIAGNOSIS — Z7951 Long term (current) use of inhaled steroids: Secondary | ICD-10-CM | POA: Diagnosis not present

## 2018-04-02 DIAGNOSIS — Z7982 Long term (current) use of aspirin: Secondary | ICD-10-CM | POA: Diagnosis not present

## 2018-04-02 DIAGNOSIS — Z9981 Dependence on supplemental oxygen: Secondary | ICD-10-CM | POA: Diagnosis not present

## 2018-04-02 DIAGNOSIS — I509 Heart failure, unspecified: Secondary | ICD-10-CM | POA: Diagnosis not present

## 2018-04-02 DIAGNOSIS — G4733 Obstructive sleep apnea (adult) (pediatric): Secondary | ICD-10-CM | POA: Diagnosis not present

## 2018-04-02 DIAGNOSIS — E1122 Type 2 diabetes mellitus with diabetic chronic kidney disease: Secondary | ICD-10-CM | POA: Diagnosis not present

## 2018-04-02 DIAGNOSIS — I130 Hypertensive heart and chronic kidney disease with heart failure and stage 1 through stage 4 chronic kidney disease, or unspecified chronic kidney disease: Secondary | ICD-10-CM | POA: Diagnosis not present

## 2018-04-02 DIAGNOSIS — Z96653 Presence of artificial knee joint, bilateral: Secondary | ICD-10-CM | POA: Diagnosis not present

## 2018-04-02 DIAGNOSIS — E039 Hypothyroidism, unspecified: Secondary | ICD-10-CM | POA: Diagnosis not present

## 2018-04-02 DIAGNOSIS — E1136 Type 2 diabetes mellitus with diabetic cataract: Secondary | ICD-10-CM | POA: Diagnosis not present

## 2018-04-02 DIAGNOSIS — D631 Anemia in chronic kidney disease: Secondary | ICD-10-CM | POA: Diagnosis not present

## 2018-04-02 DIAGNOSIS — R1319 Other dysphagia: Secondary | ICD-10-CM | POA: Diagnosis not present

## 2018-04-02 DIAGNOSIS — E785 Hyperlipidemia, unspecified: Secondary | ICD-10-CM | POA: Diagnosis not present

## 2018-04-02 DIAGNOSIS — K5792 Diverticulitis of intestine, part unspecified, without perforation or abscess without bleeding: Secondary | ICD-10-CM | POA: Diagnosis not present

## 2018-04-02 DIAGNOSIS — N183 Chronic kidney disease, stage 3 (moderate): Secondary | ICD-10-CM | POA: Diagnosis not present

## 2018-04-02 DIAGNOSIS — J449 Chronic obstructive pulmonary disease, unspecified: Secondary | ICD-10-CM | POA: Diagnosis not present

## 2018-04-02 DIAGNOSIS — K219 Gastro-esophageal reflux disease without esophagitis: Secondary | ICD-10-CM | POA: Diagnosis not present

## 2018-04-02 DIAGNOSIS — Z87440 Personal history of urinary (tract) infections: Secondary | ICD-10-CM | POA: Diagnosis not present

## 2018-04-02 DIAGNOSIS — Z87891 Personal history of nicotine dependence: Secondary | ICD-10-CM | POA: Diagnosis not present

## 2018-04-02 DIAGNOSIS — M1991 Primary osteoarthritis, unspecified site: Secondary | ICD-10-CM | POA: Diagnosis not present

## 2018-04-02 DIAGNOSIS — J9611 Chronic respiratory failure with hypoxia: Secondary | ICD-10-CM | POA: Diagnosis not present

## 2018-04-02 MED ORDER — POTASSIUM CHLORIDE ER 10 MEQ PO TBCR: 10.0000 meq | EXTENDED_RELEASE_TABLET | Freq: Every day | ORAL | 1 refills | Status: DC

## 2018-04-02 NOTE — Addendum Note (Signed)
Addended byDamita Dunnings D on: 04/02/2018 02:40 PM   Modules accepted: Orders

## 2018-04-05 DIAGNOSIS — M1991 Primary osteoarthritis, unspecified site: Secondary | ICD-10-CM | POA: Diagnosis not present

## 2018-04-05 DIAGNOSIS — R1319 Other dysphagia: Secondary | ICD-10-CM | POA: Diagnosis not present

## 2018-04-05 DIAGNOSIS — G4733 Obstructive sleep apnea (adult) (pediatric): Secondary | ICD-10-CM | POA: Diagnosis not present

## 2018-04-05 DIAGNOSIS — Z87891 Personal history of nicotine dependence: Secondary | ICD-10-CM | POA: Diagnosis not present

## 2018-04-05 DIAGNOSIS — J449 Chronic obstructive pulmonary disease, unspecified: Secondary | ICD-10-CM | POA: Diagnosis not present

## 2018-04-05 DIAGNOSIS — Z7982 Long term (current) use of aspirin: Secondary | ICD-10-CM | POA: Diagnosis not present

## 2018-04-05 DIAGNOSIS — D631 Anemia in chronic kidney disease: Secondary | ICD-10-CM | POA: Diagnosis not present

## 2018-04-05 DIAGNOSIS — I509 Heart failure, unspecified: Secondary | ICD-10-CM | POA: Diagnosis not present

## 2018-04-05 DIAGNOSIS — K219 Gastro-esophageal reflux disease without esophagitis: Secondary | ICD-10-CM | POA: Diagnosis not present

## 2018-04-05 DIAGNOSIS — E1122 Type 2 diabetes mellitus with diabetic chronic kidney disease: Secondary | ICD-10-CM | POA: Diagnosis not present

## 2018-04-05 DIAGNOSIS — E1136 Type 2 diabetes mellitus with diabetic cataract: Secondary | ICD-10-CM | POA: Diagnosis not present

## 2018-04-05 DIAGNOSIS — E785 Hyperlipidemia, unspecified: Secondary | ICD-10-CM | POA: Diagnosis not present

## 2018-04-05 DIAGNOSIS — E039 Hypothyroidism, unspecified: Secondary | ICD-10-CM | POA: Diagnosis not present

## 2018-04-05 DIAGNOSIS — I130 Hypertensive heart and chronic kidney disease with heart failure and stage 1 through stage 4 chronic kidney disease, or unspecified chronic kidney disease: Secondary | ICD-10-CM | POA: Diagnosis not present

## 2018-04-05 DIAGNOSIS — K5792 Diverticulitis of intestine, part unspecified, without perforation or abscess without bleeding: Secondary | ICD-10-CM | POA: Diagnosis not present

## 2018-04-05 DIAGNOSIS — Z7951 Long term (current) use of inhaled steroids: Secondary | ICD-10-CM | POA: Diagnosis not present

## 2018-04-05 DIAGNOSIS — Z96653 Presence of artificial knee joint, bilateral: Secondary | ICD-10-CM | POA: Diagnosis not present

## 2018-04-05 DIAGNOSIS — J9611 Chronic respiratory failure with hypoxia: Secondary | ICD-10-CM | POA: Diagnosis not present

## 2018-04-05 DIAGNOSIS — N183 Chronic kidney disease, stage 3 (moderate): Secondary | ICD-10-CM | POA: Diagnosis not present

## 2018-04-05 DIAGNOSIS — Z9981 Dependence on supplemental oxygen: Secondary | ICD-10-CM | POA: Diagnosis not present

## 2018-04-05 DIAGNOSIS — Z87440 Personal history of urinary (tract) infections: Secondary | ICD-10-CM | POA: Diagnosis not present

## 2018-04-05 NOTE — Telephone Encounter (Signed)
Orders signed and faxed back to [facility] at [phone]. Form sent for scanning.  

## 2018-04-06 ENCOUNTER — Other Ambulatory Visit: Payer: Self-pay

## 2018-04-06 ENCOUNTER — Telehealth: Payer: Self-pay

## 2018-04-06 NOTE — Telephone Encounter (Signed)
Potential interaction between oscillospira and Zofran- instructed Pt to stop Zofran- she has already done so- removed med from med list.

## 2018-04-07 DIAGNOSIS — J449 Chronic obstructive pulmonary disease, unspecified: Secondary | ICD-10-CM | POA: Diagnosis not present

## 2018-04-07 DIAGNOSIS — J9611 Chronic respiratory failure with hypoxia: Secondary | ICD-10-CM | POA: Diagnosis not present

## 2018-04-07 DIAGNOSIS — N183 Chronic kidney disease, stage 3 (moderate): Secondary | ICD-10-CM | POA: Diagnosis not present

## 2018-04-07 DIAGNOSIS — I509 Heart failure, unspecified: Secondary | ICD-10-CM | POA: Diagnosis not present

## 2018-04-07 DIAGNOSIS — G4733 Obstructive sleep apnea (adult) (pediatric): Secondary | ICD-10-CM | POA: Diagnosis not present

## 2018-04-07 DIAGNOSIS — Z96653 Presence of artificial knee joint, bilateral: Secondary | ICD-10-CM | POA: Diagnosis not present

## 2018-04-07 DIAGNOSIS — K219 Gastro-esophageal reflux disease without esophagitis: Secondary | ICD-10-CM | POA: Diagnosis not present

## 2018-04-07 DIAGNOSIS — Z7951 Long term (current) use of inhaled steroids: Secondary | ICD-10-CM | POA: Diagnosis not present

## 2018-04-07 DIAGNOSIS — E1122 Type 2 diabetes mellitus with diabetic chronic kidney disease: Secondary | ICD-10-CM | POA: Diagnosis not present

## 2018-04-07 DIAGNOSIS — E1136 Type 2 diabetes mellitus with diabetic cataract: Secondary | ICD-10-CM | POA: Diagnosis not present

## 2018-04-07 DIAGNOSIS — E039 Hypothyroidism, unspecified: Secondary | ICD-10-CM | POA: Diagnosis not present

## 2018-04-07 DIAGNOSIS — M1991 Primary osteoarthritis, unspecified site: Secondary | ICD-10-CM | POA: Diagnosis not present

## 2018-04-07 DIAGNOSIS — Z87891 Personal history of nicotine dependence: Secondary | ICD-10-CM | POA: Diagnosis not present

## 2018-04-07 DIAGNOSIS — Z9981 Dependence on supplemental oxygen: Secondary | ICD-10-CM | POA: Diagnosis not present

## 2018-04-07 DIAGNOSIS — D631 Anemia in chronic kidney disease: Secondary | ICD-10-CM | POA: Diagnosis not present

## 2018-04-07 DIAGNOSIS — I130 Hypertensive heart and chronic kidney disease with heart failure and stage 1 through stage 4 chronic kidney disease, or unspecified chronic kidney disease: Secondary | ICD-10-CM | POA: Diagnosis not present

## 2018-04-07 DIAGNOSIS — R1319 Other dysphagia: Secondary | ICD-10-CM | POA: Diagnosis not present

## 2018-04-07 DIAGNOSIS — E785 Hyperlipidemia, unspecified: Secondary | ICD-10-CM | POA: Diagnosis not present

## 2018-04-07 DIAGNOSIS — Z7982 Long term (current) use of aspirin: Secondary | ICD-10-CM | POA: Diagnosis not present

## 2018-04-07 DIAGNOSIS — Z87440 Personal history of urinary (tract) infections: Secondary | ICD-10-CM | POA: Diagnosis not present

## 2018-04-07 DIAGNOSIS — K5792 Diverticulitis of intestine, part unspecified, without perforation or abscess without bleeding: Secondary | ICD-10-CM | POA: Diagnosis not present

## 2018-04-08 ENCOUNTER — Other Ambulatory Visit: Payer: Self-pay

## 2018-04-08 ENCOUNTER — Encounter (HOSPITAL_COMMUNITY): Payer: Self-pay | Admitting: Pharmacy Technician

## 2018-04-08 ENCOUNTER — Emergency Department (HOSPITAL_COMMUNITY)
Admission: EM | Admit: 2018-04-08 | Discharge: 2018-04-08 | Disposition: A | Discharge status: Home / Self Care | Payer: Medicare Other | Attending: Emergency Medicine | Admitting: Emergency Medicine

## 2018-04-08 ENCOUNTER — Emergency Department (HOSPITAL_COMMUNITY): Payer: Medicare Other

## 2018-04-08 DIAGNOSIS — E876 Hypokalemia: Secondary | ICD-10-CM | POA: Diagnosis not present

## 2018-04-08 DIAGNOSIS — E119 Type 2 diabetes mellitus without complications: Secondary | ICD-10-CM | POA: Insufficient documentation

## 2018-04-08 DIAGNOSIS — Z79899 Other long term (current) drug therapy: Secondary | ICD-10-CM | POA: Insufficient documentation

## 2018-04-08 DIAGNOSIS — R1110 Vomiting, unspecified: Secondary | ICD-10-CM | POA: Diagnosis present

## 2018-04-08 DIAGNOSIS — Z9981 Dependence on supplemental oxygen: Secondary | ICD-10-CM | POA: Diagnosis not present

## 2018-04-08 DIAGNOSIS — Z8679 Personal history of other diseases of the circulatory system: Secondary | ICD-10-CM | POA: Diagnosis not present

## 2018-04-08 DIAGNOSIS — Z87891 Personal history of nicotine dependence: Secondary | ICD-10-CM | POA: Diagnosis not present

## 2018-04-08 DIAGNOSIS — I10 Essential (primary) hypertension: Secondary | ICD-10-CM | POA: Insufficient documentation

## 2018-04-08 DIAGNOSIS — N3000 Acute cystitis without hematuria: Secondary | ICD-10-CM

## 2018-04-08 DIAGNOSIS — R079 Chest pain, unspecified: Secondary | ICD-10-CM | POA: Diagnosis not present

## 2018-04-08 DIAGNOSIS — E039 Hypothyroidism, unspecified: Secondary | ICD-10-CM | POA: Insufficient documentation

## 2018-04-08 LAB — COMPREHENSIVE METABOLIC PANEL
ALT: 12 U/L (ref 0–44)
AST: 28 U/L (ref 15–41)
Albumin: 2.8 g/dL — ABNORMAL LOW (ref 3.5–5.0)
Alkaline Phosphatase: 47 U/L (ref 38–126)
Anion gap: 9 (ref 5–15)
BILIRUBIN TOTAL: 0.8 mg/dL (ref 0.3–1.2)
BUN: 6 mg/dL — AB (ref 8–23)
CO2: 28 mmol/L (ref 22–32)
CREATININE: 0.92 mg/dL (ref 0.44–1.00)
Calcium: 8.9 mg/dL (ref 8.9–10.3)
Chloride: 100 mmol/L (ref 98–111)
Glucose, Bld: 95 mg/dL (ref 70–99)
POTASSIUM: 2.8 mmol/L — AB (ref 3.5–5.1)
Sodium: 137 mmol/L (ref 135–145)
TOTAL PROTEIN: 7.6 g/dL (ref 6.5–8.1)

## 2018-04-08 LAB — URINALYSIS, ROUTINE W REFLEX MICROSCOPIC
BILIRUBIN URINE: NEGATIVE
GLUCOSE, UA: NEGATIVE mg/dL
Ketones, ur: NEGATIVE mg/dL
NITRITE: NEGATIVE
Protein, ur: 30 mg/dL — AB
SPECIFIC GRAVITY, URINE: 1.014 (ref 1.005–1.030)
Squamous Epithelial / LPF: >50 — ABNORMAL HIGH (ref 0–5)
WBC, UA: >50 WBC/hpf — ABNORMAL HIGH (ref 0–5)
pH: 6 (ref 5.0–8.0)

## 2018-04-08 LAB — CBC
HEMATOCRIT: 30.2 % — AB (ref 36.0–46.0)
Hemoglobin: 9.5 g/dL — ABNORMAL LOW (ref 12.0–15.0)
MCH: 25.6 pg — AB (ref 26.0–34.0)
MCHC: 31.5 g/dL (ref 30.0–36.0)
MCV: 81.4 fL (ref 80.0–100.0)
PLATELETS: 319 10*3/uL (ref 150–400)
RBC: 3.71 MIL/uL — ABNORMAL LOW (ref 3.87–5.11)
RDW: 17.2 % — AB (ref 11.5–15.5)
WBC: 3.4 10*3/uL — ABNORMAL LOW (ref 4.0–10.5)
nRBC: 0 % (ref 0.0–0.2)

## 2018-04-08 LAB — LIPASE, BLOOD: Lipase: 50 U/L (ref 11–51)

## 2018-04-08 LAB — MAGNESIUM: Magnesium: 1.5 mg/dL — ABNORMAL LOW (ref 1.7–2.4)

## 2018-04-08 LAB — I-STAT TROPONIN, ED: TROPONIN I, POC: 0.05 ng/mL (ref 0.00–0.08)

## 2018-04-08 MED ORDER — POTASSIUM CHLORIDE 10 MEQ/100ML IV SOLN
10.0000 meq | INTRAVENOUS | Status: AC
  Administered 2018-04-08 (×2): 10 meq via INTRAVENOUS
  Filled 2018-04-08 (×2): qty 100

## 2018-04-08 MED ORDER — CEPHALEXIN 250 MG PO CAPS
500.0000 mg | ORAL_CAPSULE | Freq: Once | ORAL | Status: AC
  Administered 2018-04-08: 500 mg via ORAL
  Filled 2018-04-08: qty 2

## 2018-04-08 MED ORDER — POTASSIUM CHLORIDE CRYS ER 20 MEQ PO TBCR: 20.0000 meq | EXTENDED_RELEASE_TABLET | Freq: Two times a day (BID) | ORAL | 0 refills | Status: DC

## 2018-04-08 MED ORDER — CEPHALEXIN 500 MG PO CAPS: 500.0000 mg | ORAL_CAPSULE | Freq: Two times a day (BID) | ORAL | 0 refills | Status: AC

## 2018-04-08 MED ORDER — METOPROLOL TARTRATE 25 MG PO TABS
37.5000 mg | ORAL_TABLET | Freq: Once | ORAL | Status: AC
  Administered 2018-04-08: 37.5 mg via ORAL
  Filled 2018-04-08: qty 2

## 2018-04-08 NOTE — ED Notes (Signed)
Instructed pt to inform us when she had to use bathroom so we can collect a sample

## 2018-04-08 NOTE — ED Notes (Signed)
Patient transported to X-ray 

## 2018-04-08 NOTE — ED Triage Notes (Signed)
Pt reports emesis X1 today along with abd pain. Pt reports PCP took her off of her nausea medication on Monday.

## 2018-04-08 NOTE — ED Notes (Addendum)
Unable to get urine d/t pt having bowel movements along with urine.

## 2018-04-08 NOTE — ED Provider Notes (Signed)
[facility] EMERGENCY DEPARTMENT Provider Note   CSN: [id number] Arrival date & time: 04/08/18  1834     History   Chief Complaint Chief Complaint  Patient presents with  . Emesis    HPI [name] is a 67 y.o. female with a history of pericarditis, diverticulosis, diabetes mellitus, HLD, HTN, OSA, chronic respiratory failure with hypoxia on 2 L home O2 who presents to the emergency department with a chief complaint of "I just don't feel good today."  She reports that she has been feeling generally on well today.  She traveled to [place] to visit family for the holiday.  She ports she had one episode of nonbloody, nonbilious emesis earlier today, which is not unusual as she has been having almost daily emesis for months.  She is followed by primary care and GI and is awaiting follow-up studies to test her for H. pylori.  She also recently had a cardiac MRI which demonstrated pericarditis and she has been taking colchicine.  She reports that she is also supposed be taking ibuprofen, but has not yet initiated this medication.  Ports that she was taking Zofran at home, but received a call from her PCPs office earlier this week telling her to stop taking Zofran because it interacted with her escitalopram, which she states that she takes as needed and will typically take 1 tablet around 3 times per week.  She states that although she has been on the Zofran at home for the last few months she is continued to have chronic vomiting and chronic sensation of feeling as if something is stuck in her throat.  She reports that other than her chronic emesis, she is having bilateral lower abdominal pain that she describes as achy that began over the last few days and urinary frequency.  She denies hematuria, diarrhea, constipation, melena, hematochezia, hematemesis.  She also reports that yesterday that she had an episode of "spasms" in the left side of her neck and hurt her right  thigh.  She states that when she was having the pain in the left side of her neck that she also began having some chest pain under her left breast with shortness of breath.  She reports that she has not had to go up on her home oxygen with her symptoms and she has not had any chest pain or shortness of breath or other episodes of spasming today.  She denies leg swelling, palpitations, back pain, rash, dizziness, lightheadedness, headache, or visual changes.  She reports that she is trying to get established with a new primary care provider as she lives at home alone and is unable to drive.  She depends on transportation to her medical appointments.  She states that her current PCP is in [facility] and she is unable to travel that far to make the appointment so she is looking for someone new.  She also reports that she is supposed to be following up with rheumatology, but the initial referral was denied so she has not been seen yet.  She reports that she has been able to walk today and has had not any fever or chills.  She has a home health aide that comes out to her home for 1 to 2 hours daily that assist her with ADLs including cooking and changing out her bedside commode.   The history is provided by the patient. No language interpreter was used.    Past Medical History:  Diagnosis Date  .  Anterolisthesis    Grade 1, L4-5  . Bronchospasm 05/28/2012  . CHEST PAIN 11/18/2007  . DEGENERATIVE JOINT DISEASE 10/06/2006  . DEPRESSION 09/26/2008  . DIABETES MELLITUS 10/06/2006  . Diverticulosis    2505,3976  . GERD (gastroesophageal reflux disease) 07/25/2013  . HYPERLIPIDEMIA 01/11/2009  . HYPERTENSION 10/06/2006  . INSOMNIA 09/26/2008  . Internal hemorrhoids   . OBSTRUCTIVE SLEEP APNEA 06/23/2008   Severe OSA per sleep study 2010, Rx a CPAP  . Pain in joint, multiple sites 11/10/2006  . UNSPECIFIED ANEMIA 12/10/2009  . UTI (urinary tract infection) 11/2017    Patient Active Problem List   Diagnosis  Date Noted  . Pyrosis 03/12/2018  . Acid indigestion 03/12/2018  . History of Helicobacter pylori infection 02/01/2018  . Dysphagia 02/01/2018  . Anxiety and depression   . Hypothyroidism 12/22/2017  . ILD (interstitial lung disease) ([place]) 11/25/2017  . Chronic respiratory failure with hypoxia ([place]) 11/12/2017  . Pericardial effusion   . Paresthesia 10/19/2017  . Neck pain 10/19/2017  . PCP NOTES >>> 02/23/2015  . Nocturnal oxygen desaturation 01/02/2015  . Morbid obesity ([place]) 01/02/2015  . Asthma, chronic 01/02/2015  . GERD (gastroesophageal reflux disease) 07/25/2013  . DOE (dyspnea on exertion) 04/25/2013  . Bronchospasm 05/28/2012  . Internal hemorrhoids without mention of complication 73/41/9379  . Annual physical exam 06/06/2011  . Anemia 12/10/2009  . SKIN LESION 08/06/2009  . Hyperlipidemia 01/11/2009  . Depression 09/26/2008  . INSOMNIA 09/26/2008  . OBSTRUCTIVE SLEEP APNEA 06/23/2008  . Chest pain 11/18/2007  . DM II (diabetes mellitus, type II), controlled ([place]) 10/06/2006  . Essential hypertension 10/06/2006  . Osteoarthritis 10/06/2006    Past Surgical History:  Procedure Laterality Date  . BIOPSY  12/28/2017   Procedure: BIOPSY;  Surgeon: Irving Copas., MD;  Location: [facility];  Service: Gastroenterology;;  . COLONOSCOPY  08/01/2011   Procedure: COLONOSCOPY;  Surgeon: Inda Castle, MD;  Location: [facility];  Service: Endoscopy;  Laterality: N/A;  . ESOPHAGOGASTRODUODENOSCOPY (EGD) WITH PROPOFOL N/A 12/28/2017   Procedure: ESOPHAGOGASTRODUODENOSCOPY (EGD) WITH PROPOFOL;  Surgeon: Rush Landmark Telford Nab., MD;  Location: ;  Service: Gastroenterology;  Laterality: N/A;  . LEFT HEART CATH AND CORONARY ANGIOGRAPHY N/A 10/22/2017   Procedure: LEFT HEART CATH AND CORONARY ANGIOGRAPHY;  Surgeon: Jettie Booze, MD;  Location: [facility];  Service: Cardiovascular;  Laterality: N/A;  . 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 Right  knee replacement  2005     OB History   None      Home Medications    Prior to Admission medications   Medication Sig Start Date End Date Taking? Authorizing Provider  albuterol (PROVENTIL HFA;VENTOLIN HFA) 108 (90 Base) MCG/ACT inhaler Inhale 2 puffs into the lungs every 4 (four) hours as needed for wheezing. 11/20/17 01/18/29 Yes Emokpae, Courage, MD  aspirin EC 81 MG tablet Take 1 tablet (81 mg total) by mouth daily. 03/16/18  Yes Paz, Alda Berthold, MD  colchicine 0.6 MG tablet Take 1 tablet (0.6 mg total) by mouth 2 (two) times daily. 03/26/18  Yes Turner, Eber Hong, MD  escitalopram (LEXAPRO) 10 MG tablet Take 1 tablet (10 mg total) by mouth daily. Patient taking differently: Take 10 mg by mouth as needed.  12/23/17  Yes Vann, Jessica U, DO  Fluticasone-Salmeterol (ADVAIR) 250-50 MCG/DOSE AEPB Inhale 2 puffs into the lungs daily.    Yes [provider]  ibuprofen (ADVIL,MOTRIN) 600 MG tablet Take 1 tablet (600 mg total) by mouth 3 (  three) times daily. FOR 2 WEEKS Patient taking differently: Take 600 mg by mouth as needed.  03/26/18  Yes Turner, Eber Hong, MD  latanoprost (XALATAN) 0.005 % ophthalmic solution USE 1 DROP IN BOTH EYES AT BEDTIME Patient taking differently: Place 1 drop into both eyes at bedtime.  11/20/17  Yes Roxan Hockey, MD  levothyroxine (SYNTHROID, LEVOTHROID) 75 MCG tablet Take 1 tablet (75 mcg total) by mouth daily before breakfast. 03/16/18  Yes Paz, Alda Berthold, MD  metoprolol tartrate (LOPRESSOR) 25 MG tablet Take 37.5 mg by mouth 2 (two) times daily. (1.5 tablets twice a day)   Yes [provider]  OVER THE COUNTER MEDICATION CPAP   Yes [provider]  OXYGEN Inhale 2 L into the lungs continuous.    Yes [provider]  pantoprazole (PROTONIX) 40 MG tablet Take 1 tablet (40 mg total) by mouth daily. 03/26/18  Yes Turner, Eber Hong, MD  rosuvastatin (CRESTOR) 40 MG tablet Take 1 tablet (40 mg total) by mouth every evening. 03/16/18  Yes Paz,  Alda Berthold, MD  sitaGLIPtin (JANUVIA) 50 MG tablet Take 1 tablet (50 mg total) by mouth daily. 03/01/18  Yes Renato Shin, MD  ACCU-CHEK SOFTCLIX LANCETS lancets USE TO CHECK BLOOD SUGAR TWICE A DAY E11.9 02/08/18   Renato Shin, MD  Blood Glucose Monitoring Suppl (ACCU-CHEK AVIVA) device Use as instructed to check blood sugar twice daily dx E11.9 02/08/18 02/08/19  Renato Shin, MD  cephALEXin (KEFLEX) 500 MG capsule Take 1 capsule (500 mg total) by mouth 2 (two) times daily for 5 days. 04/08/18 04/13/18  Hailea Eaglin A, PA-C  glucose blood (ACCU-CHEK AVIVA PLUS) test strip TEST BLOD SUGAR TWICE DAILY AND LANCETS TWICE DAILY E11.9 02/08/18   Renato Shin, MD  potassium chloride SA (K-DUR,KLOR-CON) 20 MEQ tablet Take 1 tablet (20 mEq total) by mouth 2 (two) times daily for 5 days. 04/08/18 04/13/18  Philemon Riedesel, Laymond Purser, PA-C    Family History Family History  Problem Relation Age of Onset  . Asthma Mother   . Stroke Mother   . Diabetes Other        M, B, S  . Hypertension Sister        M, S,B  . Pancreatic cancer Brother   . Colon cancer Neg Hx   . Prostate cancer Neg Hx   . Breast cancer Neg Hx   . Esophageal cancer Neg Hx   . Liver disease Neg Hx   . Rectal cancer Neg Hx   . Stomach cancer Neg Hx   . Inflammatory bowel disease Neg Hx     Social History Social History   Tobacco Use  . Smoking status: Former Smoker    Packs/day: 0.20    Years: 4.00    Pack years: 0.80    Types: Cigarettes    Last attempt to quit: 05/13/1995    Years since quitting: 22.9  . Smokeless tobacco: Never Used  Substance Use Topics  . Alcohol use: Not Currently  . Drug use: No     Allergies   Patient has no known allergies.   Review of Systems Review of Systems  Constitutional: Negative for activity change, chills and fever.  HENT: Negative for congestion, rhinorrhea, sinus pain and sore throat.   Eyes: Negative for photophobia, redness and visual disturbance.  Respiratory: Positive for shortness  of breath. Negative for chest tightness.   Cardiovascular: Positive for chest pain. Negative for palpitations and leg swelling.  Gastrointestinal: Positive for abdominal pain, nausea  and vomiting. Negative for abdominal distention, anal bleeding, blood in stool, constipation, diarrhea and rectal pain.  Genitourinary: Positive for frequency. Negative for dysuria, pelvic pain, urgency, vaginal bleeding and vaginal discharge.  Musculoskeletal: Positive for myalgias and neck pain. Negative for arthralgias, back pain, gait problem, joint swelling and neck stiffness.  Skin: Negative for rash and wound.  Allergic/Immunologic: Negative for immunocompromised state.  Neurological: Negative for dizziness, seizures, syncope, weakness, light-headedness, numbness and headaches.  Psychiatric/Behavioral: Negative for confusion.   Physical Exam Updated Vital Signs BP (!) 175/94   Pulse 80   Temp 97.8 F (36.6 C) (Oral)   Resp (!) 21   Ht 5\' 9"  (1.753 m)   Wt 88.9 kg   SpO2 100%   BMI 28.94 kg/m   Physical Exam  Constitutional: No distress. Nasal cannula in place.  Chronically ill-appearing.  Nasal cannula is in place.  HENT:  Head: Normocephalic.  Eyes: Pupils are equal, round, and reactive to light. Conjunctivae and EOM are normal. No scleral icterus.  Neck: Normal range of motion. Neck supple.  Cardiovascular: Normal rate, regular rhythm, normal heart sounds and intact distal pulses. Exam reveals no gallop and no friction rub.  No murmur heard. Pulmonary/Chest: Effort normal. No stridor. No respiratory distress. She has no wheezes. She has no rales. She exhibits no tenderness.  No increased work of breathing, nasal flaring, retractions, or accessory muscle use.  Lungs are clear to auscultation bilaterally.  Abdominal: Soft. She exhibits no distension and no mass. There is tenderness. There is no rebound and no guarding. No hernia.  Tender to palpation in the bilateral lower quadrants without  rebound or guarding.  No peritoneal signs.  Abdomen is soft, nondistended.  No tenderness over McBurney's point.  No CVA tenderness bilaterally.  Upper abdomen is unremarkable.  Musculoskeletal: Normal range of motion. She exhibits no edema, tenderness or deformity.  Neurological: She is alert.  Skin: Skin is warm. No rash noted. She is not diaphoretic.  Psychiatric: Her behavior is normal.  Nursing note and vitals reviewed.    ED Treatments / Results  Labs (all labs ordered are listed, but only abnormal results are displayed) Labs Reviewed  CBC - Abnormal; Notable for the following components:      Result Value   WBC 3.4 (*)    RBC 3.71 (*)    Hemoglobin 9.5 (*)    HCT 30.2 (*)    MCH 25.6 (*)    RDW 17.2 (*)    All other components within normal limits  COMPREHENSIVE METABOLIC PANEL - Abnormal; Notable for the following components:   Potassium 2.8 (*)    BUN 6 (*)    Albumin 2.8 (*)    All other components within normal limits  URINALYSIS, ROUTINE W REFLEX MICROSCOPIC - Abnormal; Notable for the following components:   Color, Urine AMBER (*)    APPearance CLOUDY (*)    Hgb urine dipstick SMALL (*)    Protein, ur 30 (*)    Leukocytes, UA LARGE (*)    WBC, UA >50 (*)    Bacteria, UA MANY (*)    Squamous Epithelial / LPF >50 (*)    All other components within normal limits  MAGNESIUM - Abnormal; Notable for the following components:   Magnesium 1.5 (*)    All other components within normal limits  URINE CULTURE  LIPASE, BLOOD  I-STAT TROPONIN, ED  I-STAT TROPONIN, ED    EKG EKG Interpretation  Date/Time:  Thursday April 08 2018 19:31:49 EST  Ventricular Rate:  80 PR Interval:    QRS Duration: 106 QT Interval:  418 QTC Calculation: 483 R Axis:   45 Text Interpretation:  Sinus rhythm Artifact in lead(s) I II aVR aVL aVF and baseline wander in lead(s) I II aVR V6 since last tracing no significant change Confirmed by Malvin Johns [phone]) on 04/08/2018 7:42:08  PM   Radiology Dg Chest 2 View  Result Date: 04/08/2018 CLINICAL DATA:  Chest pain EXAM: CHEST - 2 VIEW COMPARISON:  12/27/2017 chest radiograph. FINDINGS: Stable cardiomediastinal silhouette with top-normal heart size. No pneumothorax. No pleural effusion. No overt pulmonary edema. No acute consolidative airspace disease. Mild bibasilar scarring. IMPRESSION: No acute cardiopulmonary disease.  Mild bibasilar scarring. Electronically Signed   By: Ilona Sorrel M.D.   On: 04/08/2018 20:40    Procedures Procedures (including critical care time)  Medications Ordered in ED Medications  potassium chloride 10 mEq in 100 mL IVPB (0 mEq Intravenous Stopped 04/08/18 2331)  metoprolol tartrate (LOPRESSOR) tablet 37.5 mg (37.5 mg Oral Given 04/08/18 2255)  cephALEXin (KEFLEX) capsule 500 mg (500 mg Oral Given 04/08/18 2331)     Initial Impression / Assessment and Plan / ED Course  I have reviewed the triage vital signs and the nursing notes.  Pertinent labs & imaging results that were available during my care of the patient were reviewed by me and considered in my medical decision making (see chart for details).  67 year old female with a complicated medical history including pericarditis, diverticulosis, diabetes mellitus, HLD, HTN, OSA, chronic respiratory failure with hypoxia on 2 L home O2 presenting with generalized malaise, lower abdominal pain, and chronic vomiting.  She was recently told to stop taking Zofran due to an interaction with Lexapro.  She did endorse one episode of pain under her left breast yesterday with some left-sided neck pain and shortness of breath, but none today.  No constitutional symptoms.  Will order labs, EKG, chest x-ray, and urinalysis and plan for reassessment.  The patient was discussed with Dr. Stark Jock, attending physician, who is in agreement with the work-up and plan.  She is afebrile and without tachycardia or tachypnea on arrival.  She is hypertensive, but she is  due for her home dose of metoprolol, which she has not taken since this morning.  Clinical Course as of Apr 09 1  Thu Apr 08, 2018  2036 Patient recheck.  She reports that her aide is at her home daily.  She has recently been evaluated for home PT and OT.  She reports that she has been taking the colchicine that was prescribed after her cardiac MRI for pericarditis for the last few weeks, but she has not started taking the ibuprofen yet because she forgot to pick up the prescription.  She also reports that she has not finished following up for the labs to test her for H. pylori.  She has not resumed her PPI since she has not completed those labs.  She also has yet to get established with a new PCP that is closer to her home since she is having transportation concerns and has not yet been evaluated by rheumatology.   [MM]    Clinical Course User Index [MM] Leobardo Granlund A, PA-C   Labs are notable for potassium of 2.8, which was replenished with IV potassium chloride in the ED.  UA is concerning for infection and urine culture has been sent.  There is a small decrease in her hemoglobin, which is 9.5 today, but she has  no signs or symptoms of active bleeding. no previous urine cultures are available for the last several years for comparison.  First dose of Keflex given in the ED.  She was also given her nighttime dose of metoprolol.  Chest x-ray is unremarkable.  EKG is unchanged from previous.  Labs are otherwise at her baseline.  On repeat abdominal exam, abdomen is soft, nontender, nondistended.  I do not feel that she would benefit from abdominal imaging at this time and her abdomen is not surgical.    Although she has multiple chronic medical conditions that she is currently in the process of being worked up for, including pericarditis and concern for H. Pylori infection, I do not feel the patient would benefit from inpatient admission and would not meet criteria at this time.  She recently had a  negative cardiac catheterization earlier this year and denies chest pain at this time.  No evidence of dehydration and the patient was successfully fluid challenged in the emergency department.  Will discharge home with Keflex for UTI and potassium chloride for hypokalemia.  She is advised to take her first dose of oral potassium chloride tonight.  She states that she feels safe at home and has been ambulatory today and has ambulated multiple times to the restroom in the ED without difficulty.  Strict return precautions given.  The patient is hemodynamically stable and in no acute distress.  She is safe for discharge home with outpatient follow-up at this time.  Final Clinical Impressions(s) / ED Diagnoses   Final diagnoses:  Hypokalemia  Acute cystitis without hematuria  History of pericarditis    ED Discharge Orders         Ordered    potassium chloride SA (K-DUR,KLOR-CON) 20 MEQ tablet  2 times daily     04/08/18 2312    cephALEXin (KEFLEX) 500 MG capsule  2 times daily     04/08/18 2312           Windsor Zirkelbach A, PA-C 04/09/18 0002    Veryl Speak, MD 04/09/18 [phone]

## 2018-04-08 NOTE — Discharge Instructions (Addendum)
Thank you for allowing me to care for you today in the Emergency Department.   Please follow up regarding your ER today by getting established with a new primary care provider. Call the number on your discharge paperwork to get established with someone that is closer to your home. If you need transportation to your appointments, call the number above.   Take 1 tablet of Keflex 2 times daily for the next 5 days.  Your first dose was given tonight in the emergency department.  Your potassium was low today.  You were given IV potassium in the emergency department.  Please take 2 tablets of potassium chloride 2 times daily for the next 5 days.  Unfortunately, since you are taking escitalopram, all of the nausea and vomiting medications interact with this medication.  I would recommend discussing this with primary care to see if there is another medication that you can go on that may not cause an interaction with nausea and vomiting.  If your vomiting does not improve, please continue to follow-up with gastroenterology so that you can complete the studies for the H. pylori testing.  Please keep your next follow-up appointment with cardiology.  Return to the emergency department if you develop worsening vomiting, high fever, significant shortness of breath, blood in your urine, or other new, concerning symptoms.

## 2018-04-10 LAB — URINE CULTURE: Culture: >=100000 — AB

## 2018-04-11 ENCOUNTER — Telehealth: Payer: Self-pay

## 2018-04-11 DIAGNOSIS — G4733 Obstructive sleep apnea (adult) (pediatric): Secondary | ICD-10-CM | POA: Diagnosis not present

## 2018-04-11 DIAGNOSIS — R0902 Hypoxemia: Secondary | ICD-10-CM | POA: Diagnosis not present

## 2018-04-11 NOTE — Telephone Encounter (Signed)
Post ED Visit - Positive Culture Follow-up  Culture report reviewed by antimicrobial stewardship pharmacist:  []  Elenor Quinones, Pharm.D. []  Heide Guile, Pharm.D., BCPS AQ-ID []  Parks Neptune, Pharm.D., BCPS []  Alycia Rossetti, Pharm.D., BCPS []  [place], Pharm.D., BCPS, AAHIVP []  Legrand Como, Pharm.D., BCPS, AAHIVP [x]  Salome Arnt, PharmD, BCPS []  Johnnette Gourd, PharmD, BCPS []  Hughes Better, PharmD, BCPS []  Leeroy Cha, PharmD  Positive urine culture Treated with Cephalexin, organism and no further patient follow-up is required at this time.  Genia Del 04/11/2018, 11:48 AM

## 2018-04-12 ENCOUNTER — Telehealth: Payer: Self-pay

## 2018-04-12 NOTE — Telephone Encounter (Signed)
Needs ED f/u sometime this week. Please schedule.

## 2018-04-12 NOTE — Telephone Encounter (Addendum)
Pt states she does not have transportation to get to an appt. She said it is just not possible. She asks if it is possible for him to call her b/c she just can't make it over here.

## 2018-04-12 NOTE — Telephone Encounter (Signed)
At the ER, her potassium was low. There was also concerned about a UTI however urinalysis showed lactobacillus (needs repeated UCX). Please ask if she has fever, chills, dysuria or gross hematuria. Could we arrange the home health agency to check BMP, Mg and a UC UCX Dx hypokalemia, UTI

## 2018-04-13 ENCOUNTER — Other Ambulatory Visit: Payer: Self-pay

## 2018-04-13 ENCOUNTER — Other Ambulatory Visit: Payer: Self-pay | Admitting: Pharmacist

## 2018-04-13 ENCOUNTER — Other Ambulatory Visit: Payer: Self-pay | Admitting: Internal Medicine

## 2018-04-13 MED ORDER — ESCITALOPRAM OXALATE 10 MG PO TABS: 10.0000 mg | ORAL_TABLET | Freq: Every day | ORAL | 0 refills | Status: DC

## 2018-04-13 NOTE — Telephone Encounter (Signed)
Copied from [place] [phone]. Topic: Quick Communication - Rx Refill/Question >> Apr 13, 2018 10:46 AM Reyne Dumas L wrote: Medication: escitalopram (LEXAPRO) 10 MG tablet  Has the patient contacted their pharmacy? Yes - pharmacy contacted Katie the [facility] pharmacist and she is calling to request this for the pharmacy (Agent: If no, request that the patient contact the pharmacy for the refill.) (Agent: If yes, when and what did the pharmacy advise?)  Preferred Pharmacy (with phone number or street name): [place], [place] [phone] (Phone) [phone] (Fax)  Agent: Please be advised that RX refills may take up to 3 business days. We ask that you follow-up with your pharmacy.

## 2018-04-13 NOTE — Telephone Encounter (Signed)
Addendum to previous encounter due to connectivity issue: Chief Strategy Officer phoned pt. to get status update, and pt. Stated she was not sure where the senior [employer] application was, "I get so much mail", and was unaware of her home health nurse name or her next scheduled visit. As stated in previous encounter, author left detailed VM with home health nurse. Pt. stated her health aide came this morning, however. Author encouraged pt. to ask the next home health aide or nurse who comes to visit to contact our office ASAP regarding new orders. Author encouraged pt. to ask son to help her find and fill out the senior [employer] application. Re-routed to Dr. Larose Kells to potentially further advise.

## 2018-04-13 NOTE — Telephone Encounter (Signed)
thx

## 2018-04-13 NOTE — Telephone Encounter (Signed)
Author phoned Thea Silversmith from [facility], last seen pt on 11/12 per Epic. No answer, author left detailed VM asking for return call concerning need for labwork per Dr. Larose Kells, as well as to get update on [employer] transportation service, as social work appears to have been trying to orchestrate.  Chief Strategy Officer then phoned main nurse line, but unable to speak to anyone. Author phoned pt.

## 2018-04-13 NOTE — Telephone Encounter (Signed)
Pt called back requesting to speak with [facility].  Pt can be reached at [phone].

## 2018-04-13 NOTE — Telephone Encounter (Signed)
Denton Brick, RN returned call. Denton Brick stated that [facility] management does not handle home health orders. Denton Brick stated that it looked like pt. had [facility], but did not know of any other history. Number provided from 11/13 note: Winnifred Friar, at [phone]. Denton Brick stated she would reach out to pt's [facility] health coach and have her follow-up with transportation concern. Chief Strategy Officer phoned [facility] to relay Dr. Ethel Rana lab orders, and left detailed VM asking for return call.

## 2018-04-13 NOTE — Patient Outreach (Signed)
[facility]) Care Management  04/13/2018  [name] Jun 03, 1950 [id number]   RNCM returned call to [facility] with Dr. Larose Kells office. Per Raquel Sarna, Dr. Larose Kells would like lab work to be drawn on client. Per Raquel Sarna, Client did not attend appointment. She states that client is having transportation issues with getting to her PCP visits.   RNCM assisted Raquel Sarna with providing information re: last known home health agency involved in client's care, [facility]. Per Raquel Sarna, client states she does not know where the application for senior wheels is. Raquel Sarna request follow up with the senior wheels application in the home.   Plan: update Health Coach involved in care and social work referral completed.  Thea Silversmith, RN, MSN, [place] Coordinator Cell: [phone]

## 2018-04-13 NOTE — Telephone Encounter (Signed)
Rx sent 

## 2018-04-13 NOTE — Patient Outreach (Signed)
[facility]) Care Management  04/13/2018  [name] 1951-04-26 [id number]   Received call from Rogers Blocker at [facility]. He is working on [name]'s pill pack, but needs a new prescription for escitalopram 10 mg. He states he has attempted to fax the prescriber multiple times.   Called primary care provider Dr. Ethel Rana office. Asked that they put in a refill request for escitalopram 10 mg daily.   Catie Darnelle Maffucci, PharmD PGY2 Ambulatory Care Pharmacy Resident, [facility] Phone: [phone]

## 2018-04-14 ENCOUNTER — Other Ambulatory Visit: Payer: Self-pay

## 2018-04-14 DIAGNOSIS — N181 Chronic kidney disease, stage 1: Principal | ICD-10-CM

## 2018-04-14 DIAGNOSIS — E1122 Type 2 diabetes mellitus with diabetic chronic kidney disease: Secondary | ICD-10-CM

## 2018-04-14 MED ORDER — GLUCOSE BLOOD VI STRP: ORAL_STRIP | 11 refills | Status: DC

## 2018-04-14 NOTE — Telephone Encounter (Signed)
Amber, SW from [facility] called to update on transportation. Pt. has refused to use [facility],has had difficulty with [employer], and Museum/gallery conservator is currently working on [employer]. SW to do home visit today and will do ppwk with her, expected to be able to use transport service in next two weeks. Dr. Larose Kells and Vilma Prader, CMA made aware.

## 2018-04-14 NOTE — Telephone Encounter (Signed)
Received notification from [place] re: pt request to refill Accu-Chek Aviva Plus test strips. Request authorized and refill sent as requested.

## 2018-04-14 NOTE — Patient Outreach (Signed)
[facility]) Care Management  04/14/2018  [name] 04/27/1951 [id number]   Outreach to [name] regarding social work referral for continued issues with transportation.  [name] currently utilizes [facility] for transport to medical appointments. She reported that sometimes the drivers show up and are not aware that she needs assistance getting in and out of her home due to DME that must be transported with her.  Per [name], she is utilizing this transportation to her appointment tomorrow and was told that the driver will be made aware of her needing assistance. [name] continues to decline linkage to [facility] services.  BSW inquired about whether or not she completed and mailed the [employer] application that was mailed to her on two separate occasions.  [name] reported that she misplaced the applications but just found them today.  BSW educated her about how to apply for and use this service.  BSW scheduled home visit for today in order to get application completed and further educate her about using this service. BSW called RN, Raynelle Dick, to inform her of transportation assistance that is being offered to [name] and that home visit was scheduled for today.  Ronn Melena, BSW Social Worker [phone]

## 2018-04-15 ENCOUNTER — Encounter: Payer: Self-pay | Admitting: Cardiology

## 2018-04-15 ENCOUNTER — Ambulatory Visit (INDEPENDENT_AMBULATORY_CARE_PROVIDER_SITE_OTHER): Payer: Medicare Other | Admitting: Cardiology

## 2018-04-15 ENCOUNTER — Other Ambulatory Visit: Payer: Self-pay

## 2018-04-15 ENCOUNTER — Encounter (INDEPENDENT_AMBULATORY_CARE_PROVIDER_SITE_OTHER): Payer: Self-pay

## 2018-04-15 VITALS — BP 132/80 | HR 83 | Ht 69.0 in | Wt 187.8 lb

## 2018-04-15 DIAGNOSIS — E876 Hypokalemia: Secondary | ICD-10-CM

## 2018-04-15 DIAGNOSIS — I319 Disease of pericardium, unspecified: Secondary | ICD-10-CM

## 2018-04-15 DIAGNOSIS — R079 Chest pain, unspecified: Secondary | ICD-10-CM | POA: Diagnosis not present

## 2018-04-15 DIAGNOSIS — N390 Urinary tract infection, site not specified: Secondary | ICD-10-CM

## 2018-04-15 MED ORDER — COLCHICINE 0.6 MG PO TABS: 0.6000 mg | ORAL_TABLET | Freq: Every day | ORAL | 3 refills | Status: DC

## 2018-04-15 NOTE — Patient Instructions (Addendum)
Medication Instructions:  Your physician has recommended you make the following change in your medication:  1.  DECREASE the Colchicine to 1 tablet a day  If you need a refill on your cardiac medications before your next appointment, please call your pharmacy.   Lab work: TODAY:  MAG & BMET  If you have labs (blood work) drawn today and your tests are completely normal, you will receive your results only by: Marland Kitchen MyChart Message (if you have MyChart) OR . A paper copy in the mail If you have any lab test that is abnormal or we need to change your treatment, we will call you to review the results.  Testing/Procedures: None ordered  Follow-Up: . Your physician recommends that you schedule a follow-up appointment in: 2 [facility] APP .   Any Other Special Instructions Will Be Listed Below (If Applicable).

## 2018-04-15 NOTE — Patient Outreach (Signed)
[facility]) Care Management  04/15/2018  ADDILYNN MOWRER Sep 13, 1950 [id number]   BSW received call from Anderson Malta, [place] at [facility], reporting that Ms. Olvera had unfilled prescriptions from most recent ED visit with her at office visit today.  BSW suggested that Ms. Honda's son be contacted to take prescriptions to pharmacy to be filled.  Per provider note from today's visit, medications were reviewed and "patient has no concerns regarding medicines"  Therefore, no pharmacy order was placed.   BSW contacted RNCM, Thea Silversmith, to inform her of phone call. There was also discussion about how the family intends to continue managing Ms. Brophy's care as she informed BSW yesterday that her son is relocating to [place].  BSW will call [name] tomorrow to further discuss this concern.    Ronn Melena, BSW Social Worker [phone]

## 2018-04-15 NOTE — Patient Outreach (Signed)
[facility]) Care Management  04/14/2018  [name] 1951/02/03 [id number]   Home visit with [name] to assist with completion of Senior Wheels application. Application completed and mailed to [employer] [place].  BSW will call [employer] next week to ensure she has been approved for service.  BSW will assist [name] with scheduling first transport with this service.  Ronn Melena, BSW Social Worker [phone]

## 2018-04-15 NOTE — Progress Notes (Signed)
Cardiology Office Note   Date:  04/15/2018   ID:  [name], DOB [birth year], MRN [id number]  PCP:  Colon Branch, MD  Cardiologist:  Dr. Radford Pax    Chief Complaint  Patient presents with  . Chest Pain    pericarditis       History of Present Illness: [name] is a 67 y.o. female who presents for pericarditis.     She has a hx of HLD, HTN, DM II, OSA on CPAP, hypothyroidism and obesity.She had a previous Myoview obtained on 09/10/2016 that showed no ST segment deviation, EF 45% with normal perfusion. Follow-up echocardiogram obtained on 09/09/2016 showed EF 55 to 60%, no wall motion abnormality. She uses 3 L nasal cannula oxygen at home.   She was admitted on 10/19/2017 with acute onset of shortness of breath. CTA of the chest revealed multifocal groundglass opacity with differentials include pulmonary edema versus infectious versus inflammatory etiology. There was also small to moderate pericardial effusion measuring up to 2 cm on the CTA. Her TSH was high, Synthroid was increased. She was also severely hypoglycemic and was given D50. Cardiology service was consulted for pericardial effusion. Echocardiogram obtained on 10/20/2017 showed EF 55 to 60%, mild LVH, mild pericardial effusion.She eventually underwent cardiac catheterization on 10/22/2017 which showed normal coronaries, EF 55 to 65%. Due to concern of her symptoms related to pericarditis, patient was started on Motrin 600 mg 3 times daily for 2 weeks and colchicine 0.6 mg twice daily for 53-month along with PPI GI prophylaxis. It was also recommended for her to follow-up with rheumatology for further evaluation. Due to low LVEDP on cath, it was recommended that she stop the Lasix and potassium supplement.   Since discharge from the hospital, she was readmitted on 11/12/2017 with shortness of breath.She also had recurrent chest discomfort. Due to the negative cardiac catheterization, no further ischemic  work-up was done. 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 Pulmonology service recommended discontinuation of diltiazem as positive causative agent and a repeat chest CT in 8 weeks. Unfortunately, patient was readmitted on 7/9 with generalized weakness and tachycardia. He was found to have a urinary tract infection and diarrhea. C. difficile was negative. She was ruled out of PE through VQ scan. However on 7/11, she had unresponsive episode. Code stroke was activated and the patient was evaluated by neurology service. EEG was unremarkable. MRI of the brain was negative. She did develop acute renal failure, this was felt to be prerenal. Renal ultrasound was normal. Her Micardis was stopped. She was eventually discharged home.   She was seen back in the office on 12/18/2017 by the extender and was continued complaint of chest pain under her left breast worse with lying down or taking a deep breath.  It was felt to be more related to musculoskeletal etiology.  She was still taking colchicine 0.6 mg twice daily and she still was complaining of chronic shortness of breath.  Her blood pressure was mildly elevated and her metoprolol was increased to 37.5 mg twice daily.  Repeat limited echo showed a trivial pericardial effusion posterior to the heart.  This was actually improved from the echo on 10/20/2017.  03/04/18.  She says that she continues to have a chest pain but thinks it could be related to indigestion.  She has been having a lot of burning in her chest and feeling that food is getting caught in her chest.  Her pain is worse when she lies down and occasionally worse with taking a deep breath in.  She is chronically nauseated.  She has chronic shortness of breath which she says is not changed in some time.  She is pretty sedentary though and is in wheelchair today.  She complains of chronic ankle edema.  She denies any  dizziness or syncope, PND orthopnea. Her CRP and sed rate were elevated so cardiac MRI was done   Results "Please have patient know that cardiac MRI showed normal LV function with mildly thickened heart muscle, normal RV, trivial TR and MR and small pericardial effusion but normal thickness of the pericardium. There is some late gadolinium enhancement consistent with acute pericarditis. Please start colchicine 0.6 mg twice daily. Also start ibuprofen 600 mg 3 times daily x2 weeks. Please have patient change Pepcid to Protonix 40 mg daily for GI prophylaxis. She will need an exceeded extender back in 2 to 3 weeks to see how she is doing. Also please make sure she is gotten into a PCP so that they can get her directly into a rheumatologist. The rheumatologist in town have refused to take her on is a patient due to noncompliance in the past"  Today-she tells me she has been to ER for emesis and when she went home she fell, EMS had to break her door down now worried about people coming in and taking things.  She still has some chest pain.  Also with nausea.  She does have diarrhea after her colchicine.  This may be leading to her hypokalemia and hypomagnesium.   She has her walker with her today and is in a wheelchair.  She is wearing her home oxygen.   Past Medical History:  Diagnosis Date  . Anterolisthesis    Grade 1, L4-5  . Bronchospasm 05/28/2012  . CHEST PAIN 11/18/2007  . DEGENERATIVE JOINT DISEASE 10/06/2006  . DEPRESSION 09/26/2008  . DIABETES MELLITUS 10/06/2006  . Diverticulosis    4098,1191  . GERD (gastroesophageal reflux disease) 07/25/2013  . HYPERLIPIDEMIA 01/11/2009  . HYPERTENSION 10/06/2006  . INSOMNIA 09/26/2008  . Internal hemorrhoids   . OBSTRUCTIVE SLEEP APNEA 06/23/2008   Severe OSA per sleep study 2010, Rx a CPAP  . Pain in joint, multiple sites 11/10/2006  . UNSPECIFIED ANEMIA 12/10/2009  . UTI (urinary tract infection) 11/2017    Past Surgical History:  Procedure  Laterality Date  . BIOPSY  12/28/2017   Procedure: BIOPSY;  Surgeon: Irving Copas., MD;  Location: [place];  Service: Gastroenterology;;  . COLONOSCOPY  08/01/2011   Procedure: COLONOSCOPY;  Surgeon: Inda Castle, MD;  Location: [facility];  Service: Endoscopy;  Laterality: N/A;  . ESOPHAGOGASTRODUODENOSCOPY (EGD) WITH PROPOFOL N/A 12/28/2017   Procedure: ESOPHAGOGASTRODUODENOSCOPY (EGD) WITH PROPOFOL;  Surgeon: Rush Landmark Telford Nab., MD;  Location: [place];  Service: Gastroenterology;  Laterality: N/A;  . LEFT HEART CATH AND CORONARY ANGIOGRAPHY N/A 10/22/2017   Procedure: LEFT HEART CATH AND CORONARY ANGIOGRAPHY;  Surgeon: Jettie Booze, MD;  Location: [place] [facility];  Service: Cardiovascular;  Laterality: N/A;  . 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 Right knee replacement  2005     Current Outpatient Medications  Medication Sig Dispense Refill  . ACCU-CHEK SOFTCLIX LANCETS lancets USE TO CHECK BLOOD SUGAR TWICE A DAY E11.9 100 each 0  . albuterol (PROVENTIL HFA;VENTOLIN HFA) 108 (90 Base) MCG/ACT inhaler Inhale 2 puffs into the lungs every 4 (four) hours as needed for wheezing. 1 Inhaler  6  . aspirin EC 81 MG tablet Take 1 tablet (81 mg total) by mouth daily. 90 tablet 3  . Blood Glucose Monitoring Suppl (ACCU-CHEK AVIVA) device Use as instructed to check blood sugar twice daily dx E11.9 1 each 0  . colchicine 0.6 MG tablet Take 1 tablet (0.6 mg total) by mouth 2 (two) times daily. 60 tablet 6  . escitalopram (LEXAPRO) 10 MG tablet Take 1 tablet (10 mg total) by mouth daily. 30 tablet 0  . Fluticasone-Salmeterol (ADVAIR) 250-50 MCG/DOSE AEPB Inhale 2 puffs into the lungs daily.     Marland Kitchen glucose blood (ACCU-CHEK AVIVA PLUS) test strip TEST BLOOD SUGAR TWICE DAILY AND LANCETS TWICE DAILY E11.9 200 each 11  . ibuprofen (ADVIL,MOTRIN) 600 MG tablet Take 1 tablet (600 mg total) by mouth 3 (three) times daily. FOR 2 WEEKS 30 tablet 0  . latanoprost (XALATAN) 0.005 %  ophthalmic solution USE 1 DROP IN BOTH EYES AT BEDTIME 2.5 mL 10  . levothyroxine (SYNTHROID, LEVOTHROID) 75 MCG tablet Take 1 tablet (75 mcg total) by mouth daily before breakfast. 90 tablet 0  . metoprolol tartrate (LOPRESSOR) 25 MG tablet Take 37.5 mg by mouth 2 (two) times daily. (1.5 tablets twice a day)    . OVER THE COUNTER MEDICATION CPAP    . OXYGEN Inhale 2 L into the lungs continuous.     . pantoprazole (PROTONIX) 40 MG tablet Take 1 tablet (40 mg total) by mouth daily. 30 tablet 6  . potassium chloride SA (K-DUR,KLOR-CON) 20 MEQ tablet Take 1 tablet (20 mEq total) by mouth 2 (two) times daily for 5 days. 10 tablet 0  . rosuvastatin (CRESTOR) 40 MG tablet Take 1 tablet (40 mg total) by mouth every evening. 90 tablet 1  . sitaGLIPtin (JANUVIA) 50 MG tablet Take 1 tablet (50 mg total) by mouth daily. 30 tablet 11   No current facility-administered medications for this visit.     Allergies:   Patient has no known allergies.    Social History:  The patient  reports that she quit smoking about 22 years ago. Her smoking use included cigarettes. She has a 0.80 pack-year smoking history. She has never used smokeless tobacco. She reports that she drank alcohol. She reports that she does not use drugs.   Family History:  The patient's family history includes Asthma in her mother; Diabetes in her other; Hypertension in her sister; Pancreatic cancer in her brother; Stroke in her mother.    ROS:  General:no colds or fevers, continued weight loss.  Skin:no rashes or ulcers HEENT:no blurred vision, no congestion CV:see HPI PUL:see HPI GI:+ diarrhea no constipation or melena, no indigestion GU:no hematuria, no dysuria MS:no joint pain, no claudication Neuro:no syncope, no lightheadedness Endo:her diabetes is very well controlled and her insulin has been stopped, + thyroid disease followed by Dr. Prudy Feeler Readings from Last 3 Encounters:  04/15/18 187 lb 12.8 oz (85.2 kg)  04/08/18  195 lb 15.8 oz (88.9 kg)  04/01/18 196 lb (88.9 kg)     PHYSICAL EXAM: VS:  BP 132/80   Pulse 83   Ht 5\' 9"  (1.753 m)   Wt 187 lb 12.8 oz (85.2 kg)   SpO2 99%   BMI 27.73 kg/m  , BMI Body mass index is 27.73 kg/m. General:Pleasant affect, NAD Skin:Warm and dry, brisk capillary refill HEENT:normocephalic, sclera clear, mucus membranes moist Neck:supple, no JVD, no bruits  Heart:S1S2 RRR without murmur, gallup, rub or click Lungs:clear without rales, rhonchi,  or wheezes PYK:DXIP, non tender, + BS, do not palpate liver spleen or masses Ext:no lower ext edema, 1+ pedal pulses, 2+ radial pulses Neuro:alert and oriented X 3, MAE, follows commands, + facial symmetry    EKG:  EKG is NOT ordered today.    Recent Labs: 12/27/2017: B Natriuretic Peptide 278.7 02/24/2018: TSH 1.97 03/04/2018: NT-Pro BNP 1,747 04/08/2018: ALT 12; BUN 6; Creatinine, Ser 0.92; Hemoglobin 9.5; Magnesium 1.5; Platelets 319; Potassium 2.8; Sodium 137    Lipid Panel    Component Value Date/Time   CHOL 94 11/18/2017 0012   TRIG 110 11/18/2017 0012   HDL 30 (L) 11/18/2017 0012   CHOLHDL 3.1 11/18/2017 0012   VLDL 22 11/18/2017 0012   LDLCALC 42 11/18/2017 0012       Other studies Reviewed: Additional studies/ records that were reviewed today include: .  03/24/18 cardiac MRI IMPRESSION: 1. Normal left ventricular size, with mild concentric hypertrophy and normal systolic function (LVEF = 56%). There are no regional wall motion abnormalities and no late gadolinium enhancement in the left ventricular myocardium.  2. Normal right ventricular size, thickness and systolic function (LVEF = 53%). There are no regional wall motion abnormalities.  3. Mildly dilated pulmonary artery measuring 32 mm.  4. Trivial mitral and tricuspid regurgitation.  5. There is mild almost circumferential pericardial effusion. Pericardium has normal thickness but there is late gadolinium enhancement consistent  with inflammation.  These findings are consistent with acute pericarditis.  Cardiac cath 10/22/17  The left ventricular systolic function is normal.  LV end diastolic pressure is low.  The left ventricular ejection fraction is 55-65% by visual estimate.  There is no aortic valve stenosis.  Small pericardial effusion.   No CAD.  Chest pain is not ischemic in nature.  12/22/17 Echo Study Conclusions  - Left ventricle: The cavity size was normal. Systolic function was   normal. The estimated ejection fraction was in the range of 55%   to 60%. Wall motion was normal; there were no regional wall   motion abnormalities. The study is not technically sufficient to   allow evaluation of LV diastolic function. - Right ventricle: Systolic function was mildly reduced. - Pulmonary arteries: Systolic pressure could not be accurately   estimated. - Pericardium, extracardiac: A trivial pericardial effusion was   identified posterior to the heart.   ASSESSMENT AND PLAN:  1.  Pericarditis, she has completed 2 weeks of ibuprofen and is on colchicine BID,  She has developed diarrhea after she takes her colchicine.  She does continue with chest pain but does not seem as severe.  She is to see PCP and plan for rheumatology consult.  She had patent coronary arteries on cardiac cath.  She may also have GERD as partial reason for chest pain. Follow up in 1-2 months with APP or Dr. Radford Pax.   2.  UTI with ER visit and never filled Keflex.  Her son will pick her up today and take to have meds filled.  3.  Hypokalemia and hypmagnesium she tells me she has been taking her K+ at home.  Will recheck today- may be from diarrhea.    4.  Nausea has not done H pylori test yet.  GI is following  5.  HTN controlled   6.  Social issues, ie transportation PCP has appt with Dr. Larose Kells.  Also with  [facility] care management.   7.  Wt loss per PCP   Current medicines are reviewed with the patient today.  The patient  Has no concerns regarding medicines.  The following changes have been made:  See above Labs/ tests ordered today include:see above  Disposition:   FU:  see above  Signed, Cecilie Kicks, NP  04/15/2018 2:21 PM    [facility] [place], [place], [place], [place] Phone: [phone]; Fax: [phone]

## 2018-04-16 ENCOUNTER — Other Ambulatory Visit: Payer: Self-pay

## 2018-04-16 ENCOUNTER — Telehealth: Payer: Self-pay

## 2018-04-16 ENCOUNTER — Telehealth: Payer: Self-pay | Admitting: Cardiology

## 2018-04-16 LAB — BASIC METABOLIC PANEL
BUN/Creatinine Ratio: 7 — ABNORMAL LOW (ref 12–28)
BUN: 6 mg/dL — ABNORMAL LOW (ref 8–27)
CO2: 27 mmol/L (ref 20–29)
CREATININE: 0.83 mg/dL (ref 0.57–1.00)
Calcium: 9.3 mg/dL (ref 8.7–10.3)
Chloride: 98 mmol/L (ref 96–106)
GFR calc Af Amer: 83 mL/min/{1.73_m2} (ref >59)
GFR calc non Af Amer: 72 mL/min/{1.73_m2} (ref >59)
GLUCOSE: 81 mg/dL (ref 65–99)
Potassium: 3.7 mmol/L (ref 3.5–5.2)
Sodium: 140 mmol/L (ref 134–144)

## 2018-04-16 LAB — MAGNESIUM: Magnesium: 1.5 mg/dL — ABNORMAL LOW (ref 1.6–2.3)

## 2018-04-16 NOTE — Telephone Encounter (Signed)
Patient cannot remember directions for medication changes made at her visit on 04/15/18 by Cecilie Kicks. Please call to clarify

## 2018-04-16 NOTE — Telephone Encounter (Signed)
Spoke to the patient in regards to her Metoprolol Tartrate 37.5 mg.  She wanted to verify that she was to take 1 1/2 tablets.  We confirmed this dosage and she verbalized understanding.

## 2018-04-16 NOTE — Patient Outreach (Signed)
[facility]) Care Management  04/16/2018  [name] October 21, 1950 [id number]   Follow up call to [name] to discuss transportation and plans for assistance when her son relocates to [place].   Ms. Kuri utilized [place] transportation yesterday for cardiology appointment.  She reported that the driver came to her door this time and assisted her with getting in and out of the car.  Her son picked her up from this appointment and got prescriptions filled for her and picked up a new wheelchair which is smaller and lighter than the one she previously had.   BSW and Ms. Shipp discussed plans for when her son relocates to [place].  She has another son that lives in [place] which she says is "not that far away".  Per Ms. Sillas, he and/or his wife will visit several times per week to assist with needs and check on her. They are currently working to find a place for her in [place] so that she can be closer to them.  She currently has in-home aide services seven days per week; approximately 2-3 hours each day. She said that her aide can assist with groceries.  She also has a neighbor that lives down the hall who regularly calls or comes by to check on her and she purchased a life alert system a couple of months ago.   BSW will follow up with [name] upon approval of [employer].   Ronn Melena, BSW Social Worker [phone]

## 2018-04-16 NOTE — Telephone Encounter (Signed)
Copied from [place] [phone]. Topic: Referral - Status >> Apr 16, 2018 10:36 AM Scherrie Gerlach wrote: Reason for CRM: pt following up on rheumatology referral.  Pt states she prefers to stay at office in [place], because she lives in [place].  [place] not want to got to [place]. Pt has to call transportation to get to her appts

## 2018-04-16 NOTE — Telephone Encounter (Signed)
Dr. Estanislado Pandy denied to pick up Pt. Unfortunately- [place] is the next option. Raquel Sarna- can you discuss w/ Pt?

## 2018-04-19 ENCOUNTER — Telehealth: Payer: Self-pay | Admitting: *Deleted

## 2018-04-19 DIAGNOSIS — J9611 Chronic respiratory failure with hypoxia: Secondary | ICD-10-CM | POA: Diagnosis not present

## 2018-04-19 DIAGNOSIS — I509 Heart failure, unspecified: Secondary | ICD-10-CM | POA: Diagnosis not present

## 2018-04-19 DIAGNOSIS — I2510 Atherosclerotic heart disease of native coronary artery without angina pectoris: Secondary | ICD-10-CM | POA: Diagnosis not present

## 2018-04-19 MED ORDER — MAGNESIUM 400 MG PO TABS: 400.0000 mg | ORAL_TABLET | Freq: Two times a day (BID) | ORAL | 0 refills | Status: AC

## 2018-04-19 NOTE — Telephone Encounter (Signed)
Pt has been notified of lab results and recommendations. Pt states she has not way to get back to the office for her lab work. Pt states her son moved. I advised her she should look into [employer] transportation. Pt states she has a walker and O2 and does not know how to ride the [employer] bus. I explained to her that they will help with all of that. I explained to the pt that it is important to recheck labs as to medication chang. Advised PA would like pt to come in Thursday 12/12. I encouraged pt to set up for a ride to then come in the office on Friday 04/23/18 for her lab work. This should allow her time to find a ride or set up transportation with [employer] which does require a few days notice. Pt then begins to tell me her [facility] Aide has not arrived yet and she has called her 3 times this morning. Rx has been sent in.

## 2018-04-19 NOTE — Telephone Encounter (Signed)
-----   Message from Jeanann Lewandowsky, [place] sent at 04/16/2018  8:36 AM EST ----- PLEASE CALL PT THANKS

## 2018-04-21 ENCOUNTER — Other Ambulatory Visit: Payer: Self-pay

## 2018-04-21 DIAGNOSIS — E1351 Other specified diabetes mellitus with diabetic peripheral angiopathy without gangrene: Secondary | ICD-10-CM | POA: Diagnosis not present

## 2018-04-21 DIAGNOSIS — L602 Onychogryphosis: Secondary | ICD-10-CM | POA: Diagnosis not present

## 2018-04-21 DIAGNOSIS — L84 Corns and callosities: Secondary | ICD-10-CM | POA: Diagnosis not present

## 2018-04-21 NOTE — Patient Outreach (Signed)
[facility]) Care Management  04/21/2018  [name] [birth year] [id number]    1st outreach attempt to the patient for initial assessment.  HIPAA verified.  Spoke with the patient and explained my role as a Engineer, maintenance.  The patient stated that she was waiting for her ride to come and she did not want to miss it.  She asked that I give her a call back.  Plan: RN Health Coach will make an outreach attempt to the patient within thirty business.  Lazaro Arms RN, BSN, [place] Direct Dial:  [phone]  Fax: [phone]

## 2018-04-22 ENCOUNTER — Other Ambulatory Visit: Payer: Self-pay

## 2018-04-22 NOTE — Patient Outreach (Signed)
[facility]) Care Management  04/22/2018   [name] 02/01/1951 [id number]     Outreach attempt # 2 to the patient for initial assessment. HIPAA verified. Discussed and offered [facility] care management services with patient. Patient verbally agreed to services.   Social: The patient lives in the home alone.  The patient has support from her two CNA's that come to the home per week.  The patient states that she is Independent/assist with her ADLS/IADLS.  The patient has recently been talking with [employer] work and has been approved for transportation. The patient states that she has pain in her chest that travels to her back.  She states that she has spoken to doctors about the problem and has medication to help.  She states that she has had falls during this year.  She states that it is due to weakness in her legs and she has fallen from her tubing on her oxygen.  I discussed fall precautions with her and she verbalized understanding.  The durable medical equipment in the home consist of walker, cane, CBG meter, CPAP and oxygen.  Conditions:Per chart review and speaking with the patient her conditions include: HTN, OSA, COPD, GERD, DM Type II Hypothyrodism, Osteoarthritis, Hyperlipidemia, Anemia and Depression. The patient states that her weight is 175 lbs. She denies any swelling but has some shortness of breath with exertion. She wears her oxygen on 2 liters continuously.  She checks her blood sugars daily and this morning it was 81.  Her blood sugars ranges in the 90's. The patient expressed that she has a hard time remembering things and writes her information down in her book.  We discussed the signs and symptoms of COPD and the action plan.  She states that she knows them and has the information in her book to refer to.  Medications: The patient is on thirteen medications.  She has assistance with her medications and did not express any problems paying for her  meds.  Appointments: The patient states that she had an appointment with her podiatrist on 04/21/2018 and an appointment with her eye doctor on 10/19.  Advanced Directives: The patient states that she had information sent to her for the advanced directive and does not need any more information.   Current Medications:  Current Outpatient Medications  Medication Sig Dispense Refill  . ACCU-CHEK SOFTCLIX LANCETS lancets USE TO CHECK BLOOD SUGAR TWICE A DAY E11.9 100 each 0  . albuterol (PROVENTIL HFA;VENTOLIN HFA) 108 (90 Base) MCG/ACT inhaler Inhale 2 puffs into the lungs every 4 (four) hours as needed for wheezing. 1 Inhaler 6  . aspirin EC 81 MG tablet Take 1 tablet (81 mg total) by mouth daily. 90 tablet 3  . Blood Glucose Monitoring Suppl (ACCU-CHEK AVIVA) device Use as instructed to check blood sugar twice daily dx E11.9 1 each 0  . colchicine 0.6 MG tablet Take 1 tablet (0.6 mg total) by mouth daily. 90 tablet 3  . escitalopram (LEXAPRO) 10 MG tablet Take 1 tablet (10 mg total) by mouth daily. 30 tablet 0  . Fluticasone-Salmeterol (ADVAIR) 250-50 MCG/DOSE AEPB Inhale 2 puffs into the lungs daily.     Marland Kitchen glucose blood (ACCU-CHEK AVIVA PLUS) test strip TEST BLOOD SUGAR TWICE DAILY AND LANCETS TWICE DAILY E11.9 200 each 11  . ibuprofen (ADVIL,MOTRIN) 600 MG tablet Take 1 tablet (600 mg total) by mouth 3 (three) times daily. FOR 2 WEEKS 30 tablet 0  . latanoprost (XALATAN) 0.005 % ophthalmic solution  USE 1 DROP IN BOTH EYES AT BEDTIME 2.5 mL 10  . levothyroxine (SYNTHROID, LEVOTHROID) 75 MCG tablet Take 1 tablet (75 mcg total) by mouth daily before breakfast. 90 tablet 0  . Magnesium 400 MG TABS Take 400 mg by mouth 2 (two) times daily for 3 days. 6 tablet 0  . metoprolol tartrate (LOPRESSOR) 25 MG tablet Take 37.5 mg by mouth 2 (two) times daily. (1.5 tablets twice a day)    . OVER THE COUNTER MEDICATION CPAP    . OXYGEN Inhale 2 L into the lungs continuous.     . pantoprazole (PROTONIX) 40  MG tablet Take 1 tablet (40 mg total) by mouth daily. 30 tablet 6  . rosuvastatin (CRESTOR) 40 MG tablet Take 1 tablet (40 mg total) by mouth every evening. 90 tablet 1  . sitaGLIPtin (JANUVIA) 50 MG tablet Take 1 tablet (50 mg total) by mouth daily. 30 tablet 11  . potassium chloride SA (K-DUR,KLOR-CON) 20 MEQ tablet Take 1 tablet (20 mEq total) by mouth 2 (two) times daily for 5 days. 10 tablet 0   No current facility-administered medications for this visit.     Functional Status:  In your present state of health, do you have any difficulty performing the following activities: 04/22/2018 04/01/2018  Hearing? N N  Vision? N N  Difficulty concentrating or making decisions? [name]  Walking or climbing stairs? Y Y  Dressing or bathing? Y Y  Doing errands, shopping? [name]  Preparing Food and eating ? - Y  Using the Toilet? - Y  Comment - has BSC  In the past six months, have you accidently leaked urine? - Y  Comment - wears pull ups  Do you have problems with loss of bowel control? - Y  Managing your Medications? - Y  Managing your Finances? - Y  Housekeeping or managing your Housekeeping? - Y  Comment - -  Some recent data might be hidden    Fall/Depression Screening: Fall Risk  04/22/2018 04/01/2018 02/10/2018  Falls in the past year? 1 1 Yes  Number falls in past yr: 1 0 2 or more  Injury with Fall? 1 1 Yes  Comment bruised - lower back pain  Risk Factor Category  - - High Fall Risk  Risk for fall due to : Impaired balance/gait Impaired balance/gait History of fall(s)  Risk for fall due to: Comment - - -  Follow up Education provided;Falls prevention discussed Education provided;Falls prevention discussed Falls prevention discussed   PHQ 2/9 Scores 04/22/2018 04/01/2018 02/10/2018 10/26/2017 08/06/2017 03/31/2017 03/26/2016  PHQ - 2 Score 3 0 3 3 1 2  0  PHQ- 9 Score 10 - 13 13 - 5 -  Exception Documentation - - - - - - -  Not completed - - - - - - -    Assessment: Patient will   benefit from health coach outreach for disease management and support.  [facility] CM Care Plan Problem One     Most Recent Value  Care Plan Problem One  knowledge deficit related to diease management of copd  Role Documenting the Problem One  Health Coach    [facility] CM Care Plan Problem Two     Most Recent Value  Care Plan for Problem Two  Active  Interventions for Problem Two Long Term Goal   Reviewed signs and symptoms of COPD, discussed the action plan and medication adherence  [facility] Long Term Goal  in 30 days the patient will verbalize 2 symptoms  of copd  [facility] Long Term Goal Start Date  04/22/18     Plan: [place] will provide ongoing education for patient on COPD through phone calls and sending printed information to patient for further discussion.  RN Health Coach will send welcome packet with consent to patient as well as printed information on COPD.  RN Health Coach will send initial barriers letter, assessment, and care plan to primary care physician. RN Health Coach will contact patient in the month of January and patient agrees to next outreach.   Lazaro Arms RN, BSN, [place] Direct Dial:  [phone]  Fax: [phone]

## 2018-04-22 NOTE — Patient Outreach (Signed)
[facility]) Care Management  04/22/2018  [name] 12-Feb-1951 [id number]   Outreach to [employer] regarding [employer] application that was submitted last week.  [name] has been approved for services and can begin utilizing it. BSW and [name] attempted to call the [employer] via 3 way call to schedule transportation for upcoming appointments but had to leave message. BSW requested call back to me or [name].  Ronn Melena, BSW Social Worker [phone]

## 2018-04-23 ENCOUNTER — Other Ambulatory Visit: Payer: Self-pay

## 2018-04-23 ENCOUNTER — Ambulatory Visit: Payer: Self-pay

## 2018-04-26 ENCOUNTER — Other Ambulatory Visit: Payer: Self-pay

## 2018-04-26 NOTE — Patient Outreach (Signed)
[facility]) Care Management  04/26/2018  VANICE RAPPA 1950-06-10 [id number]   BSW received return call from [facility] with [employer].  She is able to request transportation for Ms. Seals's upcoming appointment on 05/26/18 but there is no more availability on 06/01/18. BSW called Ms. Brake to inform her of this and provided her with contact information for Barnett Applebaum so that she can call to schedule transport.  BSW informed Ms. Rooke that she will have to utilize [employer] for her appointment on 06/01/18 unless she has other options.  She reported that her son who lives in [place] has offered to take her to appointments if he does not have to work.  BSW encouraged her to call him about the appointment on 06/01/18.  BSW also encouraged her to try and schedule future appointments on days when he is not working so that he can assist with transportation.  Ronn Melena, BSW Social Worker [phone]

## 2018-04-27 ENCOUNTER — Other Ambulatory Visit: Payer: Medicare Other

## 2018-04-27 LAB — BASIC METABOLIC PANEL
BUN/Creatinine Ratio: 6 — ABNORMAL LOW (ref 12–28)
BUN: 5 mg/dL — ABNORMAL LOW (ref 8–27)
CO2: 23 mmol/L (ref 20–29)
Calcium: 9.2 mg/dL (ref 8.7–10.3)
Chloride: 101 mmol/L (ref 96–106)
Creatinine, Ser: 0.88 mg/dL (ref 0.57–1.00)
GFR calc Af Amer: 78 mL/min/{1.73_m2} (ref >59)
GFR calc non Af Amer: 67 mL/min/{1.73_m2} (ref >59)
Glucose: 68 mg/dL (ref 65–99)
Potassium: 3.4 mmol/L — ABNORMAL LOW (ref 3.5–5.2)
Sodium: 139 mmol/L (ref 134–144)

## 2018-04-27 LAB — MAGNESIUM: Magnesium: 1.8 mg/dL (ref 1.6–2.3)

## 2018-04-28 ENCOUNTER — Telehealth: Payer: Self-pay | Admitting: *Deleted

## 2018-04-28 DIAGNOSIS — Z79899 Other long term (current) drug therapy: Secondary | ICD-10-CM

## 2018-04-28 MED ORDER — POTASSIUM CHLORIDE CRYS ER 20 MEQ PO TBCR: EXTENDED_RELEASE_TABLET | ORAL | 3 refills | Status: DC

## 2018-04-28 NOTE — Telephone Encounter (Signed)
-----   Message from Isaiah Serge, NP sent at 04/27/2018  5:13 PM EST ----- K+ is a little low take KDUR 20 meq three times a day instead of 2 for 2 days then back to 20 meq twice per day   Recheck BMP in 3 weeks . Magnesium improved.

## 2018-04-29 ENCOUNTER — Encounter: Payer: Self-pay | Admitting: Internal Medicine

## 2018-05-01 ENCOUNTER — Emergency Department (HOSPITAL_COMMUNITY)
Admission: EM | Admit: 2018-05-01 | Discharge: 2018-05-01 | Disposition: A | Discharge status: Home / Self Care | Payer: Medicare Other | Attending: Emergency Medicine | Admitting: Emergency Medicine

## 2018-05-01 ENCOUNTER — Emergency Department (HOSPITAL_COMMUNITY): Payer: Medicare Other

## 2018-05-01 ENCOUNTER — Other Ambulatory Visit: Payer: Self-pay

## 2018-05-01 ENCOUNTER — Encounter (HOSPITAL_COMMUNITY): Payer: Self-pay | Admitting: Student

## 2018-05-01 DIAGNOSIS — R531 Weakness: Secondary | ICD-10-CM | POA: Diagnosis not present

## 2018-05-01 DIAGNOSIS — Z7982 Long term (current) use of aspirin: Secondary | ICD-10-CM | POA: Insufficient documentation

## 2018-05-01 DIAGNOSIS — E039 Hypothyroidism, unspecified: Secondary | ICD-10-CM | POA: Insufficient documentation

## 2018-05-01 DIAGNOSIS — E119 Type 2 diabetes mellitus without complications: Secondary | ICD-10-CM | POA: Insufficient documentation

## 2018-05-01 DIAGNOSIS — I10 Essential (primary) hypertension: Secondary | ICD-10-CM | POA: Insufficient documentation

## 2018-05-01 DIAGNOSIS — Z87891 Personal history of nicotine dependence: Secondary | ICD-10-CM | POA: Diagnosis not present

## 2018-05-01 DIAGNOSIS — Z743 Need for continuous supervision: Secondary | ICD-10-CM | POA: Diagnosis not present

## 2018-05-01 DIAGNOSIS — R0600 Dyspnea, unspecified: Secondary | ICD-10-CM

## 2018-05-01 DIAGNOSIS — Z79899 Other long term (current) drug therapy: Secondary | ICD-10-CM | POA: Insufficient documentation

## 2018-05-01 DIAGNOSIS — R279 Unspecified lack of coordination: Secondary | ICD-10-CM | POA: Diagnosis not present

## 2018-05-01 DIAGNOSIS — R000 Tachycardia, unspecified: Secondary | ICD-10-CM | POA: Diagnosis not present

## 2018-05-01 DIAGNOSIS — R0602 Shortness of breath: Secondary | ICD-10-CM | POA: Diagnosis not present

## 2018-05-01 DIAGNOSIS — R7989 Other specified abnormal findings of blood chemistry: Secondary | ICD-10-CM | POA: Diagnosis not present

## 2018-05-01 DIAGNOSIS — R079 Chest pain, unspecified: Secondary | ICD-10-CM | POA: Diagnosis not present

## 2018-05-01 HISTORY — DX: Chronic obstructive pulmonary disease, unspecified: J44.9

## 2018-05-01 LAB — URINALYSIS, MICROSCOPIC (REFLEX)

## 2018-05-01 LAB — COMPREHENSIVE METABOLIC PANEL
ALT: 13 U/L (ref 0–44)
AST: 26 U/L (ref 15–41)
Albumin: 2.5 g/dL — ABNORMAL LOW (ref 3.5–5.0)
Alkaline Phosphatase: 45 U/L (ref 38–126)
Anion gap: 8 (ref 5–15)
BUN: 5 mg/dL — ABNORMAL LOW (ref 8–23)
CO2: 26 mmol/L (ref 22–32)
Calcium: 8.4 mg/dL — ABNORMAL LOW (ref 8.9–10.3)
Chloride: 104 mmol/L (ref 98–111)
Creatinine, Ser: 1.09 mg/dL — ABNORMAL HIGH (ref 0.44–1.00)
GFR calc Af Amer: 60 mL/min — ABNORMAL LOW (ref >60)
GFR calc non Af Amer: 52 mL/min — ABNORMAL LOW (ref >60)
Glucose, Bld: 112 mg/dL — ABNORMAL HIGH (ref 70–99)
Potassium: 3.4 mmol/L — ABNORMAL LOW (ref 3.5–5.1)
Sodium: 138 mmol/L (ref 135–145)
Total Bilirubin: 0.7 mg/dL (ref 0.3–1.2)
Total Protein: 6.9 g/dL (ref 6.5–8.1)

## 2018-05-01 LAB — CBC WITH DIFFERENTIAL/PLATELET
Abs Immature Granulocytes: 0.01 10*3/uL (ref 0.00–0.07)
Basophils Absolute: 0 10*3/uL (ref 0.0–0.1)
Basophils Relative: 0 %
EOS PCT: 1 %
Eosinophils Absolute: 0 10*3/uL (ref 0.0–0.5)
HCT: 31.2 % — ABNORMAL LOW (ref 36.0–46.0)
Hemoglobin: 9.7 g/dL — ABNORMAL LOW (ref 12.0–15.0)
Immature Granulocytes: 0 %
Lymphocytes Relative: 23 %
Lymphs Abs: 0.7 10*3/uL (ref 0.7–4.0)
MCH: 25.5 pg — ABNORMAL LOW (ref 26.0–34.0)
MCHC: 31.1 g/dL (ref 30.0–36.0)
MCV: 81.9 fL (ref 80.0–100.0)
Monocytes Absolute: 0.5 10*3/uL (ref 0.1–1.0)
Monocytes Relative: 15 %
Neutro Abs: 1.9 10*3/uL (ref 1.7–7.7)
Neutrophils Relative %: 61 %
Platelets: 305 10*3/uL (ref 150–400)
RBC: 3.81 MIL/uL — ABNORMAL LOW (ref 3.87–5.11)
RDW: 17.4 % — ABNORMAL HIGH (ref 11.5–15.5)
WBC: 3 10*3/uL — ABNORMAL LOW (ref 4.0–10.5)
nRBC: 0 % (ref 0.0–0.2)

## 2018-05-01 LAB — URINALYSIS, ROUTINE W REFLEX MICROSCOPIC
Bilirubin Urine: NEGATIVE
Glucose, UA: NEGATIVE mg/dL
Hgb urine dipstick: NEGATIVE
Ketones, ur: NEGATIVE mg/dL
Nitrite: NEGATIVE
Protein, ur: NEGATIVE mg/dL
Specific Gravity, Urine: <1.005 — ABNORMAL LOW (ref 1.005–1.030)
pH: 6 (ref 5.0–8.0)

## 2018-05-01 LAB — BRAIN NATRIURETIC PEPTIDE: B Natriuretic Peptide: 258.1 pg/mL — ABNORMAL HIGH (ref 0.0–100.0)

## 2018-05-01 LAB — I-STAT TROPONIN, ED
Troponin i, poc: 0.02 ng/mL (ref 0.00–0.08)
Troponin i, poc: 0.04 ng/mL (ref 0.00–0.08)

## 2018-05-01 LAB — D-DIMER, QUANTITATIVE: D-Dimer, Quant: 1.47 ug/mL-FEU — ABNORMAL HIGH (ref 0.00–0.50)

## 2018-05-01 MED ORDER — IOPAMIDOL (ISOVUE-370) INJECTION 76%
75.0000 mL | Freq: Once | INTRAVENOUS | Status: AC | PRN
  Administered 2018-05-01: 75 mL via INTRAVENOUS

## 2018-05-01 MED ORDER — IOPAMIDOL (ISOVUE-370) INJECTION 76%
INTRAVENOUS | Status: AC
  Filled 2018-05-01: qty 100

## 2018-05-01 MED ORDER — SODIUM CHLORIDE 0.9 % IV BOLUS
500.0000 mL | Freq: Once | INTRAVENOUS | Status: AC
  Administered 2018-05-01: 500 mL via INTRAVENOUS

## 2018-05-01 NOTE — ED Triage Notes (Addendum)
Per [facility], pt was cleaning at home and became shob without much exertion. Pt wears 2L  at all times for copd. Denies pain. Tachy 124 initially, then 104 after increase of oxygen to 4L. States that she has been having chest pain sometimes at night when lying flat. Pt wears cpap at night. States that she has also had nasal congestion and coughing up green/yellow mucous x 2 weeks.

## 2018-05-01 NOTE — ED Provider Notes (Signed)
[facility] EMERGENCY DEPARTMENT Provider Note   CSN: [id number] Arrival date & time: 05/01/18  1524     History   Chief Complaint Chief Complaint  Patient presents with  . Shortness of Breath    '    HPI [name] is a 67 y.o. female with a hx of HTN, hyperlipidemia, T2DM, OSA on CPAP at night, depression, morbid obesity, interstitial lung disease w/ chronic respiratory failure supposedly on home O2 2L via [place] who presents to the ED with complaints of dyspnea which started 2 hours PTA and is resolved at present. Patient states that she has had about 2 weeks of congestion/rhinorrhea, productive cough with mucous sputum production, and feeling generally poor w/ non focal weakness. She also mentions some nausea with emesis- typically post-tussive but not always. She states that today she was cleaning out her dresser drawers which she considers to be an exertional activity and began to feel short of breath- this is what prompted her ER visit today. She was not initially wearing her oxygen, however placed this on and with this & rest had resolution of her dyspnea. She is a generally poor historian- she states she is unsure if she is suppose to be wearing this all the time, but does not wear it all the time, she does wear CPAP at night. She does get short of breath at night which is not new. When asked about chest pain she states she has it "sometimes" usually at night, this is also not new, no pain at present. Denies fever, chills, body aches, sore throat, ear pain, abdominal pain, diarrhea, melena, hematochezia, dizziness, or syncope.   Per EMS to triage RN- Patient was wearing 2L [place] upon their arrival, she was on RA briefly during transition to stretcher and maintained oxygenation >90% on RA. She was notably tachycardia to 124, when they placed her on their oxygen at 4L via [place] her HR improved to 104.   HPI  Past Medical History:  Diagnosis Date  . Anterolisthesis    Grade 1, L4-5  . Bronchospasm 05/28/2012  . CHEST PAIN 11/18/2007  . DEGENERATIVE JOINT DISEASE 10/06/2006  . DEPRESSION 09/26/2008  . DIABETES MELLITUS 10/06/2006  . Diverticulosis    [id number]  . GERD (gastroesophageal reflux disease) 07/25/2013  . HYPERLIPIDEMIA 01/11/2009  . HYPERTENSION 10/06/2006  . INSOMNIA 09/26/2008  . Internal hemorrhoids   . OBSTRUCTIVE SLEEP APNEA 06/23/2008   Severe OSA per sleep study 2010, Rx a CPAP  . Pain in joint, multiple sites 11/10/2006  . UNSPECIFIED ANEMIA 12/10/2009  . UTI (urinary tract infection) 11/2017    Patient Active Problem List   Diagnosis Date Noted  . Pyrosis 03/12/2018  . Acid indigestion 03/12/2018  . History of Helicobacter pylori infection 02/01/2018  . Dysphagia 02/01/2018  . Anxiety and depression   . Hypothyroidism 12/22/2017  . ILD (interstitial lung disease) ([place]) 11/25/2017  . Chronic respiratory failure with hypoxia ([place]) 11/12/2017  . Pericardial effusion   . Paresthesia 10/19/2017  . Neck pain 10/19/2017  . PCP NOTES >>> 02/23/2015  . Nocturnal oxygen desaturation 01/02/2015  . Morbid obesity ([place]) 01/02/2015  . Asthma, chronic 01/02/2015  . GERD (gastroesophageal reflux disease) 07/25/2013  . DOE (dyspnea on exertion) 04/25/2013  . Bronchospasm 05/28/2012  . Internal hemorrhoids without mention of complication 38/18/2993  . Annual physical exam 06/06/2011  . Anemia 12/10/2009  . SKIN LESION 08/06/2009  . Hyperlipidemia 01/11/2009  . Depression 09/26/2008  . INSOMNIA 09/26/2008  . OBSTRUCTIVE  SLEEP APNEA 06/23/2008  . Chest pain 11/18/2007  . DM II (diabetes mellitus, type II), controlled ([place]) 10/06/2006  . Essential hypertension 10/06/2006  . Osteoarthritis 10/06/2006    Past Surgical History:  Procedure Laterality Date  . BIOPSY  12/28/2017   Procedure: BIOPSY;  Surgeon: Irving Copas., MD;  Location: [facility];  Service: Gastroenterology;;  . COLONOSCOPY  08/01/2011   Procedure: COLONOSCOPY;   Surgeon: Inda Castle, MD;  Location: [facility];  Service: Endoscopy;  Laterality: N/A;  . ESOPHAGOGASTRODUODENOSCOPY (EGD) WITH PROPOFOL N/A 12/28/2017   Procedure: ESOPHAGOGASTRODUODENOSCOPY (EGD) WITH PROPOFOL;  Surgeon: Rush Landmark Telford Nab., MD;  Location: [facility];  Service: Gastroenterology;  Laterality: N/A;  . LEFT HEART CATH AND CORONARY ANGIOGRAPHY N/A 10/22/2017   Procedure: LEFT HEART CATH AND CORONARY ANGIOGRAPHY;  Surgeon: Jettie Booze, MD;  Location: [facility];  Service: Cardiovascular;  Laterality: N/A;  . 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 Right knee replacement  2005     OB History   No obstetric history on file.      Home Medications    Prior to Admission medications   Medication Sig Start Date End Date Taking? Authorizing Provider  ACCU-CHEK SOFTCLIX LANCETS lancets USE TO CHECK BLOOD SUGAR TWICE A DAY E11.9 02/08/18   Renato Shin, MD  albuterol (PROVENTIL HFA;VENTOLIN HFA) 108 (90 Base) MCG/ACT inhaler Inhale 2 puffs into the lungs every 4 (four) hours as needed for wheezing. 11/20/17 01/18/29  Roxan Hockey, MD  aspirin EC 81 MG tablet Take 1 tablet (81 mg total) by mouth daily. 03/16/18   Colon Branch, MD  Blood Glucose Monitoring Suppl (ACCU-CHEK AVIVA) device Use as instructed to check blood sugar twice daily dx E11.9 02/08/18 02/08/19  Renato Shin, MD  colchicine 0.6 MG tablet Take 1 tablet (0.6 mg total) by mouth daily. 04/15/18   Isaiah Serge, NP  escitalopram (LEXAPRO) 10 MG tablet Take 1 tablet (10 mg total) by mouth daily. 04/13/18   Colon Branch, MD  Fluticasone-Salmeterol (ADVAIR) 250-50 MCG/DOSE AEPB Inhale 2 puffs into the lungs daily.     [provider]  glucose blood (ACCU-CHEK AVIVA PLUS) test strip TEST BLOOD SUGAR TWICE DAILY AND LANCETS TWICE DAILY E11.9 04/14/18   Renato Shin, MD  ibuprofen (ADVIL,MOTRIN) 600 MG tablet Take 1 tablet (600 mg total) by mouth 3 (three) times daily. FOR 2 WEEKS 03/26/18   Turner,  Eber Hong, MD  latanoprost (XALATAN) 0.005 % ophthalmic solution USE 1 DROP IN BOTH EYES AT BEDTIME 11/20/17   Roxan Hockey, MD  levothyroxine (SYNTHROID, LEVOTHROID) 75 MCG tablet Take 1 tablet (75 mcg total) by mouth daily before breakfast. 03/16/18   Colon Branch, MD  metoprolol tartrate (LOPRESSOR) 25 MG tablet Take 37.5 mg by mouth 2 (two) times daily. (1.5 tablets twice a day)    [provider]  OVER THE COUNTER MEDICATION CPAP    [provider]  OXYGEN Inhale 2 L into the lungs continuous.     [provider]  pantoprazole (PROTONIX) 40 MG tablet Take 1 tablet (40 mg total) by mouth daily. 03/26/18   Sueanne Margarita, MD  potassium chloride SA (K-DUR,KLOR-CON) 20 MEQ tablet Take 1 tablet by mouth three times a day for 2 days then start taking 1 tablet by mouth twice a day 04/28/18   Isaiah Serge, NP  rosuvastatin (CRESTOR) 40 MG tablet Take 1 tablet (40 mg total) by mouth every evening. 03/16/18   Kathlene November  E, MD  sitaGLIPtin (JANUVIA) 50 MG tablet Take 1 tablet (50 mg total) by mouth daily. 03/01/18   Renato Shin, MD    Family History Family History  Problem Relation Age of Onset  . Asthma Mother   . Stroke Mother   . Diabetes Mother   . Diabetes Other        M, B, S  . Hypertension Sister        M, S,B  . Pancreatic cancer Brother   . Colon cancer Neg Hx   . Prostate cancer Neg Hx   . Breast cancer Neg Hx   . Esophageal cancer Neg Hx   . Liver disease Neg Hx   . Rectal cancer Neg Hx   . Stomach cancer Neg Hx   . Inflammatory bowel disease Neg Hx     Social History Social History   Tobacco Use  . Smoking status: Former Smoker    Packs/day: 0.20    Years: 4.00    Pack years: 0.80    Types: Cigarettes    Last attempt to quit: 05/13/1995    Years since quitting: 22.9  . Smokeless tobacco: Never Used  Substance Use Topics  . Alcohol use: Not Currently  . Drug use: No     Allergies   Patient has no known allergies.   Review of  Systems Review of Systems  Constitutional: Positive for fatigue. Negative for chills and fever.  HENT: Positive for congestion and rhinorrhea. Negative for ear pain and sore throat.   Respiratory: Positive for cough and shortness of breath.   Cardiovascular: Positive for chest pain (intermittent, not at present).  Gastrointestinal: Positive for vomiting. Negative for abdominal pain, anal bleeding, blood in stool, constipation and diarrhea.  Genitourinary: Negative for dysuria.  Neurological: Positive for weakness (generalized, non focal). Negative for dizziness, syncope, numbness and headaches.  All other systems reviewed and are negative.    Physical Exam Updated Vital Signs BP 126/82 (BP Location: Right Arm)   Pulse (!) 102   Temp 97.9 F (36.6 C) (Oral)   Resp (!) 21   Ht 5\' 9"  (1.753 m)   Wt 85.1 kg   SpO2 100%   BMI 27.71 kg/m   Physical Exam Vitals signs and nursing note reviewed.  Constitutional:      General: She is not in acute distress.    Appearance: She is well-developed.  HENT:     Head: Normocephalic and atraumatic.     Right Ear: Tympanic membrane is not perforated, erythematous, retracted or bulging.     Left Ear: Tympanic membrane is not perforated, erythematous, retracted or bulging.     Mouth/Throat:     Pharynx: Uvula midline. No oropharyngeal exudate or posterior oropharyngeal erythema.  Eyes:     General:        Right eye: No discharge.        Left eye: No discharge.     Conjunctiva/sclera: Conjunctivae normal.     Pupils: Pupils are equal, round, and reactive to light.  Neck:     Musculoskeletal: Normal range of motion and neck supple.  Cardiovascular:     Rate and Rhythm: Regular rhythm. Tachycardia present.     Pulses:          Radial pulses are 2+ on the right side and 2+ on the left side.  Pulmonary:     Effort: Pulmonary effort is normal. No respiratory distress.     Breath sounds: Normal breath sounds. No wheezing or rales.  Comments: SpO2 100% on home 2L via [place].  Abdominal:     General: There is no distension.     Palpations: Abdomen is soft.     Tenderness: There is no abdominal tenderness.  Musculoskeletal:     Comments: Trace symmetric edema to the lower leg bilaterally. Nontender. No overlying erythema/warmth.   Lymphadenopathy:     Cervical: No cervical adenopathy.  Skin:    General: Skin is warm and dry.     Findings: No rash.  Neurological:     Mental Status: She is alert.  Psychiatric:        Behavior: Behavior normal.    ED Treatments / Results  Labs (all labs ordered are listed, but only abnormal results are displayed) Labs Reviewed  CBC WITH DIFFERENTIAL/PLATELET - Abnormal; Notable for the following components:      Result Value   WBC 3.0 (*)    RBC 3.81 (*)    Hemoglobin 9.7 (*)    HCT 31.2 (*)    MCH 25.5 (*)    RDW 17.4 (*)    All other components within normal limits  COMPREHENSIVE METABOLIC PANEL - Abnormal; Notable for the following components:   Potassium 3.4 (*)    Glucose, Bld 112 (*)    BUN 5 (*)    Creatinine, Ser 1.09 (*)    Calcium 8.4 (*)    Albumin 2.5 (*)    GFR calc non Af Amer 52 (*)    GFR calc Af Amer 60 (*)    All other components within normal limits  D-DIMER, QUANTITATIVE (NOT AT [facility]) - Abnormal; Notable for the following components:   D-Dimer, Quant 1.47 (*)    All other components within normal limits  BRAIN NATRIURETIC PEPTIDE - Abnormal; Notable for the following components:   B Natriuretic Peptide 258.1 (*)    All other components within normal limits  URINALYSIS, ROUTINE W REFLEX MICROSCOPIC  I-STAT TROPONIN, ED    EKG EKG Interpretation  Date/Time:  Saturday May 01 2018 15:33:15 EST Ventricular Rate:  103 PR Interval:    QRS Duration: 96 QT Interval:  379 QTC Calculation: 497 R Axis:   57 Text Interpretation:  Sinus tachycardia Probable left ventricular hypertrophy Borderline prolonged QT interval Confirmed by Gerlene Fee  [phone]) on 05/01/2018 3:35:52 PM   Radiology Dg Chest 2 View  Result Date: 05/01/2018 CLINICAL DATA:  Cough and dyspnea EXAM: CHEST - 2 VIEW COMPARISON:  04/08/2018, 12/27/2017 FINDINGS: The heart size and mediastinal contours are within normal limits. Mild scarring at the lingula and left base. No focal opacity or pleural effusion. Aortic atherosclerosis. No pneumothorax. Degenerative changes of the spine. IMPRESSION: No active cardiopulmonary disease.  Stable left basilar scarring Electronically Signed   By: Donavan Foil M.D.   On: 05/01/2018 16:01   Ct Angio Chest Pe W/cm &/or Wo Cm  Result Date: 05/01/2018 CLINICAL DATA:  Chest pain.  Elevated D-dimer. EXAM: CT ANGIOGRAPHY CHEST WITH CONTRAST TECHNIQUE: Multidetector CT imaging of the chest was performed using the standard protocol during bolus administration of intravenous contrast. Multiplanar CT image reconstructions and MIPs were obtained to evaluate the vascular anatomy. CONTRAST:  72mL ISOVUE-370 IOPAMIDOL (ISOVUE-370) INJECTION 76% COMPARISON:  Chest CT 12/22/2017 FINDINGS: Cardiovascular: No filling defects within the pulmonary arteries to suggest acute pulmonary embolism. No acute findings of the aorta or great vessels. No pericardial fluid. Mediastinum/Nodes: No axillary or supraclavicular adenopathy. Enlarged RIGHT lobe of thyroid gland 2.3 cm is similar comparison exam. No mediastinal adenopathy. Esophagus  is patulous unchanged from prior. Lungs/Pleura: No pulmonary infarction. Peripheral ground-glass opacities unchanged. Diffuse ground-glass opacities in the lung bases a improved from comparison exam. Bronchiectasis in lower lobes. Upper Abdomen: Limited view of the liver, kidneys, pancreas are unremarkable. Normal adrenal glands. Musculoskeletal: No aggressive osseous lead Review of the MIP images confirms the above findings. IMPRESSION: 1. No acute pulmonary embolism. 2. Diffuse ground-glass opacities and lower lobe bronchiectasis  which is improved from comparison exam of 12/22/2017. 3. Patulous esophagus. 4. 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 Electronically Signed   By: Suzy Bouchard M.D.   On: 05/01/2018 17:41    Procedures Procedures (including critical care time)  Medications Ordered in ED Medications - No data to display   Prior Results Reviewed:   Echo 12/22/17:  Study Conclusions  - Left ventricle: The cavity size was normal. Systolic function was   normal. The estimated ejection fraction was in the range of 55%   to 60%. Wall motion was normal; there were no regional wall   motion abnormalities. The study is not technically sufficient to   allow evaluation of LV diastolic function. - Right ventricle: Systolic function was mildly reduced. - Pulmonary arteries: Systolic pressure could not be accurately   estimated. - Pericardium, extracardiac: A trivial pericardial effusion was   identified posterior to the heart.  Left Heart Cath & Coronary Angiography 10/22/17: Conclusion:   The left ventricular systolic function is normal.  LV end diastolic pressure is low.  The left ventricular ejection fraction is 55-65% by visual estimate.  There is no aortic valve stenosis.  Small pericardial effusion.   No CAD.  Chest pain is not ischemic in nature.  Initial Impression / Assessment and Plan / ED Course  I have reviewed the triage vital signs and the nursing notes.  Pertinent labs & imaging results that were available during my care of the patient were reviewed by me and considered in my medical decision making (see chart for details).    Patient presents w/ dyspnea on exertion which started 2 hours PTA and seems to be resolved at present, has had URI sxs x 2 weeks with some N/V & generalized weakness. Chest pain "sometimes" which is not new, no chest pain currently. Initially tachycardic improved from HR of 124 per EMS, saturating well on home  oxygen (unclear if this is to be worn all the time- patient is a poor historian). Work-up with labs to include d-dimer, BNP, & troponin, EKG, CXR.   Labs reviewed: baseline anemia with hgb/hct at 9.7/31.2. Leukopenia at 3.0 seems to be within prior ranges of 2.6-4.0. Mild electrolyte derangements including hypokalemia @ 3.4, hypocalcemia @ 8.4. Creatinine slightly elevated from prior 0.88, 1.06 today- has had some vomiting, will give 500 ccs of NS. Urinalysis without classic UTI, no urinary sxs, will culture. D-dimer + - CTA ordered. EKG without obvious ischemic changes, initial troponin negative. BNP elevated at 258.1- similar to prior on record, last echo 12/22/17 w/ EF of 55-60%, CXR without CHF type changes.   Imaging reviewed: CXR without active cardiopulmonary disease, stable left basilar scarring. CTA negative for PE. Diffuse ground-glass opacities and lower lobe bronchiectasis which is improved from comparison exam of 12/22/2017. There is also notable patulous esophagus: patulous esophagus and interstitial lung disease and can be associated with scleroderma per radiology. No emergent acute findings. Delta troponin negative, EKG without ischemic changes, doubt ACS. Overall reassuring work-up patient with SpO2 remaining at 100%, tachycardia normalized, patient  seems safe for discharge w/ close PCP follow up. She is requesting to go home on my re-assessment to discuss results & plan. I discussed results, treatment plan, need for follow-up, and return precautions with the patient. Provided opportunity for questions, patient confirmed understanding and is in agreement with plan.   Findings and plan of care discussed with supervising physician Dr. Sedonia Small who personally evaluated and examined this patient and is in agreement.    Final Clinical Impressions(s) / ED Diagnoses   Final diagnoses:  Dyspnea, unspecified type    ED Discharge Orders    None       Amaryllis Dyke, PA-C 05/02/18  0036    Maudie Flakes, MD 05/03/18 [phone]

## 2018-05-01 NOTE — Discharge Instructions (Addendum)
You were seen in the ER today for shortness of breath.  Your blood work all look fairly similar to prior blood work you have had done.  CT scan of your chest showed similar findings to previous, there was some changes in your esophagus, please discuss with your primary care provider.  We would like you to follow-up closely with your primary care provider within the next 2 to 3 days.  Please wear your oxygen at home.  Return to the ER for new or worsening symptoms or any other concerns.

## 2018-05-01 NOTE — ED Notes (Signed)
Pt to xray

## 2018-05-01 NOTE — ED Notes (Signed)
Denies shob at this time.

## 2018-05-01 NOTE — ED Notes (Signed)
Called ptar for pt, [name]  

## 2018-05-03 ENCOUNTER — Telehealth: Payer: Self-pay

## 2018-05-03 LAB — URINE CULTURE: Culture: 60000 — AB

## 2018-05-03 NOTE — Telephone Encounter (Signed)
thx

## 2018-05-03 NOTE — Telephone Encounter (Signed)
-----   Message from Colon Branch, MD sent at 05/02/2018 11:11 AM EST ----- Regarding: open phone note, call pt , see how is doing, needs ED f/u, she migh not be able to come , let me know how she is doing

## 2018-05-03 NOTE — Telephone Encounter (Signed)
Called patient to schedule ED follow up. Patient states she is feeling a lot better and does not need a follow up appointment at this time. States she is upset because someone has been stealing her Nebulizer tubing while she is away from the home. States she will call [facility] to have them replaced.

## 2018-05-06 NOTE — Telephone Encounter (Signed)
Author phoned pt. to make aware and check on status of rhematology referral.

## 2018-05-06 NOTE — Telephone Encounter (Signed)
noted 

## 2018-05-06 NOTE — Telephone Encounter (Signed)
Addendum: Pt. was confused about "all these doctors calling to make appointments, wanting money". Author offered to give her contact number for rheumatolgy provider in [place], and pt refused. Pt. trailed off talking about how her medications confuse her too, so author reviewed medication list with her. Author also reminded pt. On 1/15 appointment with Dr. Larose Kells, and pt. Stated "yeah, I know I have to go to that". Author stated Dr. Larose Kells could discuss the need for rheumatolgy referral at that visit, since pt. did not understand why she needed to be seen by "another doctor". Routed to Dr. Larose Kells as Juluis Rainier.

## 2018-05-11 ENCOUNTER — Other Ambulatory Visit: Payer: Self-pay

## 2018-05-11 ENCOUNTER — Other Ambulatory Visit: Payer: Self-pay | Admitting: Internal Medicine

## 2018-05-11 MED ORDER — POTASSIUM CHLORIDE CRYS ER 20 MEQ PO TBCR: 20.0000 meq | EXTENDED_RELEASE_TABLET | Freq: Two times a day (BID) | ORAL | 0 refills | Status: DC

## 2018-05-12 DIAGNOSIS — G4733 Obstructive sleep apnea (adult) (pediatric): Secondary | ICD-10-CM | POA: Diagnosis not present

## 2018-05-12 DIAGNOSIS — R0902 Hypoxemia: Secondary | ICD-10-CM | POA: Diagnosis not present

## 2018-05-17 ENCOUNTER — Telehealth: Payer: Self-pay | Admitting: *Deleted

## 2018-05-17 NOTE — Telephone Encounter (Signed)
Received Physician Orders from [place] at [facility] for SN, PT, OT, ST, do not find face-to-face visit in chart; forwarded to provider/SLS 01/06

## 2018-05-18 NOTE — Telephone Encounter (Signed)
Lattie Haw- [facility] calling to speak with sharon. In regards to the patient. Please advise

## 2018-05-19 ENCOUNTER — Other Ambulatory Visit: Payer: Self-pay

## 2018-05-19 NOTE — Telephone Encounter (Signed)
Pt seen in office on 02/24/2018 and 04/01/2018- form signed by PCP and faxed to [place] at [phone]. Form sent for scanning.

## 2018-05-20 DIAGNOSIS — J9611 Chronic respiratory failure with hypoxia: Secondary | ICD-10-CM | POA: Diagnosis not present

## 2018-05-20 DIAGNOSIS — I509 Heart failure, unspecified: Secondary | ICD-10-CM | POA: Diagnosis not present

## 2018-05-20 DIAGNOSIS — I2510 Atherosclerotic heart disease of native coronary artery without angina pectoris: Secondary | ICD-10-CM | POA: Diagnosis not present

## 2018-05-25 ENCOUNTER — Other Ambulatory Visit: Payer: Self-pay | Admitting: Internal Medicine

## 2018-05-26 ENCOUNTER — Ambulatory Visit: Payer: Self-pay | Admitting: Internal Medicine

## 2018-06-01 ENCOUNTER — Encounter: Payer: Self-pay | Admitting: Endocrinology

## 2018-06-01 ENCOUNTER — Ambulatory Visit (INDEPENDENT_AMBULATORY_CARE_PROVIDER_SITE_OTHER): Payer: Medicare Other | Admitting: Endocrinology

## 2018-06-01 VITALS — BP 150/86 | HR 88 | Ht 69.0 in | Wt 179.4 lb

## 2018-06-01 DIAGNOSIS — N181 Chronic kidney disease, stage 1: Secondary | ICD-10-CM

## 2018-06-01 DIAGNOSIS — E1122 Type 2 diabetes mellitus with diabetic chronic kidney disease: Secondary | ICD-10-CM | POA: Diagnosis not present

## 2018-06-01 LAB — POCT GLYCOSYLATED HEMOGLOBIN (HGB A1C): Hemoglobin A1C: 5 % (ref 4.0–5.6)

## 2018-06-01 NOTE — Progress Notes (Signed)
Subjective:    Patient ID: [name], female    DOB: 16-May-1950, 68 y.o.   MRN: [id number]  HPI Pt returns for f/u of diabetes mellitus: DM type: 2 Dx'ed: 7824 Complications: polyneuropathy and renal insufficiency.   Therapy: januvia GDM: never.  DKA: never. Severe hypoglycemia: once, in 2019 Pancreatitis: never.  Other: she declines weight-loss surgery; she took insulin 2004-2019--she stopped due to weight loss.   Interval history: She has lost more weight.  She takes [place] as rx'ed.  she brings a record of her cbg's which I have reviewed today.  It varies from 57-111.   Past Medical History:  Diagnosis Date  . Anterolisthesis    Grade 1, L4-5  . Bronchospasm 05/28/2012  . CHEST PAIN 11/18/2007  . COPD (chronic obstructive pulmonary disease) ([place])   . DEGENERATIVE JOINT DISEASE 10/06/2006  . DEPRESSION 09/26/2008  . DIABETES MELLITUS 10/06/2006  . Diverticulosis    2353,6144  . GERD (gastroesophageal reflux disease) 07/25/2013  . HYPERLIPIDEMIA 01/11/2009  . HYPERTENSION 10/06/2006  . INSOMNIA 09/26/2008  . Internal hemorrhoids   . OBSTRUCTIVE SLEEP APNEA 06/23/2008   Severe OSA per sleep study 2010, Rx a CPAP  . Pain in joint, multiple sites 11/10/2006  . UNSPECIFIED ANEMIA 12/10/2009  . UTI (urinary tract infection) 11/2017    Past Surgical History:  Procedure Laterality Date  . BIOPSY  12/28/2017   Procedure: BIOPSY;  Surgeon: Irving Copas., MD;  Location: [facility];  Service: Gastroenterology;;  . COLONOSCOPY  08/01/2011   Procedure: COLONOSCOPY;  Surgeon: Inda Castle, MD;  Location: [facility];  Service: Endoscopy;  Laterality: N/A;  . ESOPHAGOGASTRODUODENOSCOPY (EGD) WITH PROPOFOL N/A 12/28/2017   Procedure: ESOPHAGOGASTRODUODENOSCOPY (EGD) WITH PROPOFOL;  Surgeon: Rush Landmark Telford Nab., MD;  Location: [facility];  Service: Gastroenterology;  Laterality: N/A;  . LEFT HEART CATH AND CORONARY ANGIOGRAPHY N/A 10/22/2017   Procedure: LEFT HEART  CATH AND CORONARY ANGIOGRAPHY;  Surgeon: Jettie Booze, MD;  Location: [facility];  Service: Cardiovascular;  Laterality: N/A;  . 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 Right knee replacement  2005    Social History   Socioeconomic History  . Marital status: Widowed    Spouse name: Not on file  . Number of children: 4  . Years of education: Not on file  . Highest education level: Not on file  Occupational History  . Occupation: disability    Employer: UNEMPLOYED  Social Needs  . Financial resource strain: Not on file  . Food insecurity:    Worry: Not on file    Inability: Not on file  . Transportation needs:    Medical: Not on file    Non-medical: Not on file  Tobacco Use  . Smoking status: Former Smoker    Packs/day: 0.20    Years: 4.00    Pack years: 0.80    Types: Cigarettes    Last attempt to quit: 05/13/1995    Years since quitting: 23.0  . Smokeless tobacco: Never Used  Substance and Sexual Activity  . Alcohol use: Not Currently  . Drug use: No  . Sexual activity: Not Currently  Lifestyle  . Physical activity:    Days per week: Not on file    Minutes per session: Not on file  . Stress: Not on file  Relationships  . Social connections:    Talks on phone: Not on file    Gets together: Not on file    Attends religious service: Not on  file    Active member of club or organization: Not on file    Attends meetings of clubs or organizations: Not on file    Relationship status: Not on file  . Intimate partner violence:    Fear of current or ex partner: Not on file    Emotionally abused: Not on file    Physically abused: Not on file    Forced sexual activity: Not on file  Other Topics Concern  . Not on file  Social History Narrative   Widow , lives by herself   Lost a son, 3 living    Lost husband    Current Outpatient Medications on File Prior to Visit  Medication Sig Dispense Refill  . ACCU-CHEK SOFTCLIX LANCETS lancets USE TO CHECK BLOOD SUGAR  TWICE A DAY E11.9 100 each 0  . albuterol (PROVENTIL HFA;VENTOLIN HFA) 108 (90 Base) MCG/ACT inhaler Inhale 2 puffs into the lungs every 4 (four) hours as needed for wheezing. 1 Inhaler 6  . aspirin EC 81 MG tablet Take 1 tablet (81 mg total) by mouth daily. 90 tablet 3  . Blood Glucose Monitoring Suppl (ACCU-CHEK AVIVA) device Use as instructed to check blood sugar twice daily dx E11.9 1 each 0  . colchicine 0.6 MG tablet Take 1 tablet (0.6 mg total) by mouth daily. 90 tablet 3  . escitalopram (LEXAPRO) 10 MG tablet Take 1 tablet (10 mg total) by mouth daily. 90 tablet 0  . Fluticasone-Salmeterol (ADVAIR) 250-50 MCG/DOSE AEPB Inhale 2 puffs into the lungs daily.     Marland Kitchen glucose blood (ACCU-CHEK AVIVA PLUS) test strip TEST BLOOD SUGAR TWICE DAILY AND LANCETS TWICE DAILY E11.9 200 each 11  . ibuprofen (ADVIL,MOTRIN) 600 MG tablet Take 1 tablet (600 mg total) by mouth 3 (three) times daily. FOR 2 WEEKS (Patient taking differently: Take 600 mg by mouth 3 (three) times daily as needed (for pain). ) 30 tablet 0  . latanoprost (XALATAN) 0.005 % ophthalmic solution USE 1 DROP IN BOTH EYES AT BEDTIME (Patient taking differently: Place 1 drop into both eyes at bedtime. ) 2.5 mL 10  . levothyroxine (SYNTHROID, LEVOTHROID) 75 MCG tablet Take 1 tablet (75 mcg total) by mouth daily before breakfast. 90 tablet 0  . metoprolol tartrate (LOPRESSOR) 25 MG tablet Take 37.5 mg by mouth 2 (two) times daily.     Marland Kitchen OVER THE COUNTER MEDICATION CPAP: At bedtime    . OXYGEN Inhale 2 L into the lungs continuous.     . pantoprazole (PROTONIX) 40 MG tablet Take 1 tablet (40 mg total) by mouth daily. 30 tablet 6  . potassium chloride SA (K-DUR,KLOR-CON) 20 MEQ tablet Take 1 tablet (20 mEq total) by mouth 2 (two) times daily. 180 tablet 0  . rosuvastatin (CRESTOR) 40 MG tablet Take 1 tablet (40 mg total) by mouth every evening. 90 tablet 1   No current facility-administered medications on file prior to visit.     No Known  Allergies  Family History  Problem Relation Age of Onset  . Asthma Mother   . Stroke Mother   . Diabetes Mother   . Diabetes Other        M, B, S  . Hypertension Sister        M, S,B  . Pancreatic cancer Brother   . Colon cancer Neg Hx   . Prostate cancer Neg Hx   . Breast cancer Neg Hx   . Esophageal cancer Neg Hx   . Liver disease Neg Hx   .  Rectal cancer Neg Hx   . Stomach cancer Neg Hx   . Inflammatory bowel disease Neg Hx     BP (!) 150/86 (BP Location: Right Arm, Patient Position: Sitting, Cuff Size: Normal) Comment: states taking antihypertensive this morning  Pulse 88   Ht 5\' 9"  (1.753 m)   Wt 179 lb 6.4 oz (81.4 kg)   SpO2 (!) 89% Comment: O2 @ 2L  BMI 26.49 kg/m   Review of Systems  She seldom has nausea.      Objective:   Physical Exam VITAL SIGNS:  See vs page GENERAL: no distress.  Has 02 on.  Pulses: foot pulses are intact bilaterally.   MSK: no deformity of the feet or ankles.  CV: 1+ bilat leg edema, and bilat vv's.    Skin:  no ulcer on the feet or ankles.  normal color and temp on the feet and ankles.  Neuro: sensation is intact to touch on the feet and ankles, but decreased from normal.  Ext: There is bilateral onychomycosis of the toenails.   Lab Results  Component Value Date   HGBA1C 5.0 06/01/2018       Assessment & Plan:  Type 2 DM, with renal insuff: overcontrolled.  HTN: is noted today Weight loss: uncertain etiology  Patient Instructions  check your blood sugar once a day.  vary the time of day when you check, between before the 3 meals, and at bedtime.  also check if you have symptoms of your blood sugar being too high or too low.  please keep a record of the readings and bring it to your next appointment here.  please call us sooner if your blood sugar goes below 70, or if it stays over 200.   Please stop taking the Januvia.   Please see Dr Larose Kells soon, as scheduled, to recheck the blood pressure and weight loss.  Please come back  for a follow-up appointment in 3-4 months.

## 2018-06-01 NOTE — Patient Instructions (Addendum)
check your blood sugar once a day.  vary the time of day when you check, between before the 3 meals, and at bedtime.  also check if you have symptoms of your blood sugar being too high or too low.  please keep a record of the readings and bring it to your next appointment here.  please call us sooner if your blood sugar goes below 70, or if it stays over 200.   Please stop taking the Januvia.   Please see Dr Larose Kells soon, as scheduled, to recheck the blood pressure and weight loss.  Please come back for a follow-up appointment in 3-4 months.

## 2018-06-02 ENCOUNTER — Other Ambulatory Visit: Payer: Self-pay

## 2018-06-02 NOTE — Patient Outreach (Signed)
[facility]) Care Management  06/02/2018   [name] [birth year] [id number]  Subjective: Received call from the patient for assessment.  HIPAA verified.  The patient states that she did not receive the educational information mailed to her but she states that she has not been to her mailbox in a while.  She is going to check her mailbox and let me know at the next visit. The patient states that she has shortness of breath but not out of the norm.  She states that she still has pain in her chest and back that has been going on for two years.  She has notified her physician. The patient states that she has fallen two times since I talked with her last.  Discussed fall precautions and the patient verbalized understanding.  She saw Dr [name] yesterday and her a1c was 5.0.  She states that he stopped her taking Januvia.  She will see him back in April.  She states that she is taking all of her prescribed medications and watching her diet.  She states that she drinks ensure.  She states that she had lost some weight.  At her last office visit she  weighed 179 lbs.  She has an appointment to see Dr [name] on the 24 th.   Current Medications:  Current Outpatient Medications  Medication Sig Dispense Refill  . ACCU-CHEK SOFTCLIX LANCETS lancets USE TO CHECK BLOOD SUGAR TWICE A DAY E11.9 100 each 0  . albuterol (PROVENTIL HFA;VENTOLIN HFA) 108 (90 Base) MCG/ACT inhaler Inhale 2 puffs into the lungs every 4 (four) hours as needed for wheezing. 1 Inhaler 6  . aspirin EC 81 MG tablet Take 1 tablet (81 mg total) by mouth daily. 90 tablet 3  . Blood Glucose Monitoring Suppl (ACCU-CHEK AVIVA) device Use as instructed to check blood sugar twice daily dx E11.9 1 each 0  . colchicine 0.6 MG tablet Take 1 tablet (0.6 mg total) by mouth daily. 90 tablet 3  . escitalopram (LEXAPRO) 10 MG tablet Take 1 tablet (10 mg total) by mouth daily. 90 tablet 0  . Fluticasone-Salmeterol (ADVAIR) 250-50  MCG/DOSE AEPB Inhale 2 puffs into the lungs daily.     Marland Kitchen glucose blood (ACCU-CHEK AVIVA PLUS) test strip TEST BLOOD SUGAR TWICE DAILY AND LANCETS TWICE DAILY E11.9 200 each 11  . ibuprofen (ADVIL,MOTRIN) 600 MG tablet Take 1 tablet (600 mg total) by mouth 3 (three) times daily. FOR 2 WEEKS (Patient taking differently: Take 600 mg by mouth 3 (three) times daily as needed (for pain). ) 30 tablet 0  . latanoprost (XALATAN) 0.005 % ophthalmic solution USE 1 DROP IN BOTH EYES AT BEDTIME (Patient taking differently: Place 1 drop into both eyes at bedtime. ) 2.5 mL 10  . levothyroxine (SYNTHROID, LEVOTHROID) 75 MCG tablet Take 1 tablet (75 mcg total) by mouth daily before breakfast. 90 tablet 0  . metoprolol tartrate (LOPRESSOR) 25 MG tablet Take 37.5 mg by mouth 2 (two) times daily.     Marland Kitchen OVER THE COUNTER MEDICATION CPAP: At bedtime    . OXYGEN Inhale 2 L into the lungs continuous.     . pantoprazole (PROTONIX) 40 MG tablet Take 1 tablet (40 mg total) by mouth daily. 30 tablet 6  . potassium chloride SA (K-DUR,KLOR-CON) 20 MEQ tablet Take 1 tablet (20 mEq total) by mouth 2 (two) times daily. 180 tablet 0  . rosuvastatin (CRESTOR) 40 MG tablet Take 1 tablet (40 mg total) by mouth every evening. [place]  tablet 1   No current facility-administered medications for this visit.     Functional Status:  In your present state of health, do you have any difficulty performing the following activities: 04/22/2018 04/01/2018  Hearing? N N  Vision? N N  Difficulty concentrating or making decisions? [name]  Walking or climbing stairs? Y Y  Dressing or bathing? Y Y  Doing errands, shopping? [name]  Preparing Food and eating ? - Y  Using the Toilet? - Y  Comment - has BSC  In the past six months, have you accidently leaked urine? - Y  Comment - wears pull ups  Do you have problems with loss of bowel control? - Y  Managing your Medications? - Y  Managing your Finances? - Y  Housekeeping or managing your Housekeeping? -  Y  Comment - -  Some recent data might be hidden    Fall/Depression Screening: Fall Risk  06/02/2018 04/22/2018 04/01/2018  Falls in the past year? 1 1 1   Number falls in past yr: 0 1 0  Injury with Fall? 0 1 1  Comment - bruised -  Risk Factor Category  - - -  Risk for fall due to : - Impaired balance/gait Impaired balance/gait  Risk for fall due to: Comment - - -  Follow up Falls evaluation completed;Education provided Education provided;Falls prevention discussed Education provided;Falls prevention discussed   PHQ 2/9 Scores 04/22/2018 04/01/2018 02/10/2018 10/26/2017 08/06/2017 03/31/2017 03/26/2016  PHQ - 2 Score 3 0 3 3 1 2  0  PHQ- 9 Score 10 - 13 13 - 5 -  Exception Documentation - - - - - - -  Not completed - - - - - - -    Assessment: Patient will continue to benefit from health coach outreach for disease management and support.  THN CM Care Plan Problem One     Most Recent Value  Care Plan for Problem One  Active  THN Long Term Goal   In 30 days the patient will verbalize no resdmissions  THN Long Term Goal Start Date  06/02/18  Interventions for Problem One Long Term Goal  discussed copd action plan, signs and syptoms, and medication adherence.       Plan:  RN Health Coach will contact patient in the month of February and patient agrees to next outreach.   Lazaro Arms RN, BSN, [place] Direct Dial:  [phone]  Fax: [phone]

## 2018-06-02 NOTE — Patient Outreach (Signed)
[facility]) Care Management  06/02/2018  KRISTIAN HAZZARD [birth year] [id number]   Follow up call to [name] regarding social work referral. Ms. Renzulli had reported multiple issues with [employer].  BSW was able to connect with Gerlene Fee, Senior Clinical Transformation Consultant with [facility] to discuss concerns.  Ms. Marcucci received a call from a representative at [facility] and was ensured that concerns will be addressed. Ms. Lave utilized [facility] for MD appointment yesterday and reported no concerns.   Ms. Hirschhorn has also been approved for transportation through [employer] and verbalized understanding of how to use service.  BSW is closing case at this time but encouraged Ms. Loyola to call if other transportation or social work needs arise.   Ronn Melena, BSW Social Worker [phone]

## 2018-06-02 NOTE — Patient Outreach (Signed)
[facility]) Care Management  06/02/2018  [name] Oct 22, 1950 [id number]    Called and spoke with the patient and she states that she was on a call and could not talk at the moment.  She asked if I could give her a call later this afternoon.  Plan:  RN Health Coach will outreach the patient later this afternoon.  Lazaro Arms RN, BSN, [place] Direct Dial:  [phone]  Fax: [phone]

## 2018-06-03 ENCOUNTER — Ambulatory Visit: Payer: Self-pay | Admitting: Internal Medicine

## 2018-06-03 ENCOUNTER — Telehealth: Payer: Self-pay | Admitting: *Deleted

## 2018-06-03 NOTE — Telephone Encounter (Signed)
Verbal orders given  

## 2018-06-03 NOTE — Telephone Encounter (Signed)
Copied from [place]. Topic: Quick Communication - Home Health Verbal Orders >> Jun 03, 2018 12:49 PM Margot Ables wrote: Caller/Agency: Darlina Guys RN w/[facility] Number: [phone], secure VM if not available Requesting OT/PT/Skilled Nursing/Social Work: SN, PT, OT, and ST Frequency: There was a delay in start of care. Notifying Dr. Larose Kells they are going to see pt 06/06/2018 for Home Health SN, PT, OT, and ST

## 2018-06-04 ENCOUNTER — Encounter: Payer: Self-pay | Admitting: Internal Medicine

## 2018-06-04 ENCOUNTER — Ambulatory Visit (INDEPENDENT_AMBULATORY_CARE_PROVIDER_SITE_OTHER): Payer: Medicare Other | Admitting: Internal Medicine

## 2018-06-04 VITALS — BP 144/78 | HR 72 | Temp 97.5°F | Resp 18 | Ht 69.0 in | Wt 179.1 lb

## 2018-06-04 DIAGNOSIS — D649 Anemia, unspecified: Secondary | ICD-10-CM

## 2018-06-04 DIAGNOSIS — E010 Iodine-deficiency related diffuse (endemic) goiter: Secondary | ICD-10-CM | POA: Diagnosis not present

## 2018-06-04 DIAGNOSIS — E039 Hypothyroidism, unspecified: Secondary | ICD-10-CM

## 2018-06-04 DIAGNOSIS — I10 Essential (primary) hypertension: Secondary | ICD-10-CM | POA: Diagnosis not present

## 2018-06-04 DIAGNOSIS — R1310 Dysphagia, unspecified: Secondary | ICD-10-CM

## 2018-06-04 DIAGNOSIS — I3139 Other pericardial effusion (noninflammatory): Secondary | ICD-10-CM

## 2018-06-04 DIAGNOSIS — I313 Pericardial effusion (noninflammatory): Secondary | ICD-10-CM

## 2018-06-04 LAB — BASIC METABOLIC PANEL
BUN: 6 mg/dL (ref 6–23)
CALCIUM: 9.1 mg/dL (ref 8.4–10.5)
CO2: 28 mEq/L (ref 19–32)
Chloride: 103 mEq/L (ref 96–112)
Creatinine, Ser: 0.88 mg/dL (ref 0.40–1.20)
GFR: 77.06 mL/min (ref >60.00)
Glucose, Bld: 103 mg/dL — ABNORMAL HIGH (ref 70–99)
Potassium: 3.4 mEq/L — ABNORMAL LOW (ref 3.5–5.1)
Sodium: 140 mEq/L (ref 135–145)

## 2018-06-04 LAB — CBC WITH DIFFERENTIAL/PLATELET
Basophils Absolute: 0 10*3/uL (ref 0.0–0.1)
Basophils Relative: 1.5 % (ref 0.0–3.0)
Eosinophils Absolute: 0 10*3/uL (ref 0.0–0.7)
Eosinophils Relative: 0.2 % (ref 0.0–5.0)
HCT: 30.8 % — ABNORMAL LOW (ref 36.0–46.0)
Hemoglobin: 10.1 g/dL — ABNORMAL LOW (ref 12.0–15.0)
Lymphocytes Relative: 23.4 % (ref 12.0–46.0)
Lymphs Abs: 0.7 10*3/uL (ref 0.7–4.0)
MCHC: 32.8 g/dL (ref 30.0–36.0)
MCV: 80.8 fl (ref 78.0–100.0)
MONO ABS: 0.6 10*3/uL (ref 0.1–1.0)
Monocytes Relative: 18.9 % — ABNORMAL HIGH (ref 3.0–12.0)
Neutro Abs: 1.7 10*3/uL (ref 1.4–7.7)
Neutrophils Relative %: 56 % (ref 43.0–77.0)
Platelets: 282 10*3/uL (ref 150.0–400.0)
RBC: 3.81 Mil/uL — ABNORMAL LOW (ref 3.87–5.11)
RDW: 17.9 % — ABNORMAL HIGH (ref 11.5–15.5)
WBC: 3 10*3/uL — AB (ref 4.0–10.5)

## 2018-06-04 LAB — FERRITIN: FERRITIN: 256.8 ng/mL (ref 10.0–291.0)

## 2018-06-04 LAB — IRON: IRON: 32 ug/dL — AB (ref 42–145)

## 2018-06-04 LAB — TSH: TSH: 7.63 u[IU]/mL — AB (ref 0.35–4.50)

## 2018-06-04 MED ORDER — ESCITALOPRAM OXALATE 20 MG PO TABS: 20.0000 mg | ORAL_TABLET | Freq: Every day | ORAL | 3 refills | Status: DC

## 2018-06-04 NOTE — Progress Notes (Signed)
Subjective:    Patient ID: [name], female    DOB: 1951-04-18, 68 y.o.   MRN: [id number]  DOS:  06/04/2018 Type of visit - description:  Since the last office visit in October, she has been at the emergency department twice.  Has also seen cardiology, GI, endocrinology.  Extensive chart review.   Review of Systems She continue with the same symptoms: Occasional dysphagia, nausea vomiting.  Denies diarrhea.  Reports appetite is okay but not eating much due to GI symptoms. Occasional regurgitation. Good compliance with oxygen Still has pain on and off Still feeling depressed.   Past Medical History:  Diagnosis Date  . Anterolisthesis    Grade 1, L4-5  . Bronchospasm 05/28/2012  . CHEST PAIN 11/18/2007  . COPD (chronic obstructive pulmonary disease) ([place])   . DEGENERATIVE JOINT DISEASE 10/06/2006  . DEPRESSION 09/26/2008  . DIABETES MELLITUS 10/06/2006  . Diverticulosis    [id number]  . GERD (gastroesophageal reflux disease) 07/25/2013  . HYPERLIPIDEMIA 01/11/2009  . HYPERTENSION 10/06/2006  . INSOMNIA 09/26/2008  . Internal hemorrhoids   . OBSTRUCTIVE SLEEP APNEA 06/23/2008   Severe OSA per sleep study 2010, Rx a CPAP  . Pain in joint, multiple sites 11/10/2006  . UNSPECIFIED ANEMIA 12/10/2009  . UTI (urinary tract infection) 11/2017    Past Surgical History:  Procedure Laterality Date  . BIOPSY  12/28/2017   Procedure: BIOPSY;  Surgeon: Irving Copas., MD;  Location: [place];  Service: Gastroenterology;;  . COLONOSCOPY  08/01/2011   Procedure: COLONOSCOPY;  Surgeon: Inda Castle, MD;  Location: [facility];  Service: Endoscopy;  Laterality: N/A;  . ESOPHAGOGASTRODUODENOSCOPY (EGD) WITH PROPOFOL N/A 12/28/2017   Procedure: ESOPHAGOGASTRODUODENOSCOPY (EGD) WITH PROPOFOL;  Surgeon: Rush Landmark Telford Nab., MD;  Location: [place];  Service: Gastroenterology;  Laterality: N/A;  . LEFT HEART CATH AND CORONARY ANGIOGRAPHY N/A 10/22/2017   Procedure: LEFT  HEART CATH AND CORONARY ANGIOGRAPHY;  Surgeon: Jettie Booze, MD;  Location: [place] [facility];  Service: Cardiovascular;  Laterality: N/A;  . 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 Right knee replacement  2005    Social History   Socioeconomic History  . Marital status: Widowed    Spouse name: Not on file  . Number of children: 4  . Years of education: Not on file  . Highest education level: Not on file  Occupational History  . Occupation: disability    Employer: UNEMPLOYED  Social Needs  . Financial resource strain: Not on file  . Food insecurity:    Worry: Not on file    Inability: Not on file  . Transportation needs:    Medical: Not on file    Non-medical: Not on file  Tobacco Use  . Smoking status: Former Smoker    Packs/day: 0.20    Years: 4.00    Pack years: 0.80    Types: Cigarettes    Last attempt to quit: 05/13/1995    Years since quitting: 23.0  . Smokeless tobacco: Never Used  Substance and Sexual Activity  . Alcohol use: Not Currently  . Drug use: No  . Sexual activity: Not Currently  Lifestyle  . Physical activity:    Days per week: Not on file    Minutes per session: Not on file  . Stress: Not on file  Relationships  . Social connections:    Talks on phone: Not on file    Gets together: Not on file    Attends religious service: Not on file  Active member of club or organization: Not on file    Attends meetings of clubs or organizations: Not on file    Relationship status: Not on file  . Intimate partner violence:    Fear of current or ex partner: Not on file    Emotionally abused: Not on file    Physically abused: Not on file    Forced sexual activity: Not on file  Other Topics Concern  . Not on file  Social History Narrative   Widow , lives by herself   Lost a son, 3 living    No children in [place], [place] lives in [place], he visits and helps w/ shopping    Lost husband      Allergies as of 06/04/2018   No Known Allergies       Medication List       Accurate as of June 04, 2018 11:59 PM. Always use your most recent med list.        ACCU-CHEK AVIVA device Use as instructed to check blood sugar twice daily dx E11.9   ACCU-CHEK SOFTCLIX LANCETS lancets USE TO CHECK BLOOD SUGAR TWICE A DAY E11.9   albuterol 108 (90 Base) MCG/ACT inhaler Commonly known as:  PROVENTIL HFA;VENTOLIN HFA Inhale 2 puffs into the lungs every 4 (four) hours as needed for wheezing.   aspirin EC 81 MG tablet Take 1 tablet (81 mg total) by mouth daily.   colchicine 0.6 MG tablet Take 1 tablet (0.6 mg total) by mouth daily.   escitalopram 20 MG tablet Commonly known as:  LEXAPRO Take 1 tablet (20 mg total) by mouth daily.   Fluticasone-Salmeterol 250-50 MCG/DOSE Aepb Commonly known as:  ADVAIR Inhale 2 puffs into the lungs daily.   glucose blood test strip Commonly known as:  ACCU-CHEK AVIVA PLUS TEST BLOOD SUGAR TWICE DAILY AND LANCETS TWICE DAILY E11.9   ibuprofen 600 MG tablet Commonly known as:  ADVIL,MOTRIN Take 1 tablet (600 mg total) by mouth 3 (three) times daily. FOR 2 WEEKS   latanoprost 0.005 % ophthalmic solution Commonly known as:  XALATAN USE 1 DROP IN BOTH EYES AT BEDTIME   levothyroxine 75 MCG tablet Commonly known as:  SYNTHROID, LEVOTHROID Take 1 tablet (75 mcg total) by mouth daily before breakfast.   metoprolol tartrate 25 MG tablet Commonly known as:  LOPRESSOR Take 37.5 mg by mouth 2 (two) times daily.   OVER THE COUNTER MEDICATION CPAP: At bedtime   OXYGEN Inhale 2 L into the lungs continuous.   pantoprazole 40 MG tablet Commonly known as:  PROTONIX Take 1 tablet (40 mg total) by mouth daily.   potassium chloride SA 20 MEQ tablet Commonly known as:  K-DUR,KLOR-CON Take 1 tablet (20 mEq total) by mouth 2 (two) times daily.   rosuvastatin 40 MG tablet Commonly known as:  CRESTOR Take 1 tablet (40 mg total) by mouth every evening.           Objective:   Physical Exam BP  (!) 144/78 (BP Location: Left Arm, Patient Position: Sitting, Cuff Size: Small)   Pulse 72   Temp (!) 97.5 F (36.4 C) (Oral)   Resp 18   Ht 5\' 9"  (1.753 m)   Wt 179 lb 2 oz (81.3 kg)   SpO2 99%   BMI 26.45 kg/m  General:   Well developed, sits in a wheelchair.  On oxygen  HEENT:  Normocephalic . Face symmetric, atraumatic Neck: Enlarged thyroid symmetrically, not tender Lungs:  Decreased breath sounds Normal respiratory  effort, no intercostal retractions, no accessory muscle use. Heart: RRR,  no murmur.  No pretibial edema bilaterally  Skin: Not pale. Not jaundice Neurologic:  alert & oriented X3.  Speech normal, gait not tested, sitting in a wheelchair Psych--  Cognition and judgment appear intact.  Cooperative with normal attention span and concentration.  Behavior appropriate. Tearful, apprehensive..      Assessment    Assessment DM per endocrinology HTN Hyperlipidemia Depression, insomnia (citalopram caused headache ? 05-2014) MSK: --DJD, saw ortho ~06-2014 --Multiple joint pains, 2008, sedimentation rate, RA >> negative --Back pain>> -- MRI 2007: Epidural lipomatosis, congenitally short pedicles, spondylosis, and disc bulges and protrusions contribute to central, foraminal, and subarticular lateral recess stenoses as detailed above.  --2008, saw Ortho, Rx local injections  --local injections back Dr Mina Marble  ~ 12-2015 GI --GERD --Persisting nausea, vomiting, weight loss: Extensive work-up ~ 12/2017. --Colonoscopy due 2023  Pulmonary -Chronic respiratory failure  -asthma -OSA dx 2010, severe, nocturnal desaturation, intolerant to CPAP, on nocturnal O2 -ONO 01/2017:  + Hypoxia at night, rec  to continue oxygen at night unless able  to tolerate a CPAP   DOE, chronic CP, chronic RUQ pain:: 2004--Cath neg, areterial LE dopplers (-) 06-2003-- abd. u/s fatty liver 01-2005-- CP...neg u/s GB, neg HIDA 10-06--saw cards--cardiolite neg 12-2007 abnormal stress test Cath  02-01-08: normal coronaries  chest pain, stress test echo okay 09/2016 SOB, admitted , cath 10/2017: no CAD H/o Morbid obesity   PLAN: DM: Last visit withEndocrinology 06/01/2018 Pericarditis: Last visit with cardiology 04/15/2018, noted to have diarrhea with colchicine, had ongoing chest pain. They rec pt to see  me and rheumatology. Persisting nausea, GERD, history of H. pylori infection: Last visit with GI 03/10/2018. Was recommended H. pylori antigen to confirm eradication but I do not see that  has been pursued.. Was recommended good compliance with PPIs. Also, she had a CT chest 05-01-2018: patulous esophagus and interstitial lung disease, could be associated with a scleroderma. Hypothyroidism: On Synthroid, check a TSH Thyromegaly: Picked up by CT 12/07/2017, was recommended a [place] ordered today Rheumatology referral: Referral rec  due to a number of issues including: pericarditis, interstitial lung disease, + ANA, and now suspicious for scleroderma on CT chest. Pulmonary is concerned about drug-induced lupus or other collagen vascular diseases. Due to transportation issues, was not able to see rheumatology.  We will try to set up the referral again. Anemia: Check a CBC, iron and ferritin Depression: On Lexapro 10 mg, still tearful, increase to 20 mg Social: Widow , lives by herself No children in [place], her son Lennette Bihari lives in [place], he visits and helps w/ grocery shopping  RTC 3 months  Today, I spent more than  40  min with the patient: >50% of the time counseling regards depression on the need to increase Lexapro, also: -reviewing the chart and labs ordered by other providers  -coordinating his care

## 2018-06-04 NOTE — Patient Instructions (Addendum)
GO TO THE LAB : Get the blood work     GO TO THE FRONT DESK Schedule your next appointment   for a checkup in 3 months.    We are increasing Lexapro to 20 mg 1 a day

## 2018-06-04 NOTE — Progress Notes (Signed)
Pre visit review using our clinic review tool, if applicable. No additional management support is needed unless otherwise documented below in the visit note. 

## 2018-06-05 NOTE — Assessment & Plan Note (Signed)
DM: Last visit withEndocrinology 06/01/2018 Pericarditis: Last visit with cardiology 04/15/2018, noted to have diarrhea with colchicine, had ongoing chest pain. They rec pt to see  me and rheumatology. Persisting nausea, GERD, history of H. pylori infection: Last visit with GI 03/10/2018. Was recommended H. pylori antigen to confirm eradication but I do not see that  has been pursued.. Was recommended good compliance with PPIs. Also, she had a CT chest 05-01-2018: patulous esophagus and interstitial lung disease, could be associated with a scleroderma. Hypothyroidism: On Synthroid, check a TSH Thyromegaly: Picked up by CT 12/07/2017, was recommended a [place] ordered today Rheumatology referral: Referral rec  due to a number of issues including: pericarditis, interstitial lung disease, + ANA, and now suspicious for scleroderma on CT chest. Pulmonary is concerned about drug-induced lupus or other collagen vascular diseases. Due to transportation issues, was not able to see rheumatology.  We will try to set up the referral again. Anemia: Check a CBC, iron and ferritin Depression: On Lexapro 10 mg, still tearful, increase to 20 mg Social: Widow , lives by herself No children in [place], her son Lennette Bihari lives in [place], he visits and helps w/ grocery shopping  RTC 3 months

## 2018-06-06 DIAGNOSIS — I509 Heart failure, unspecified: Secondary | ICD-10-CM | POA: Diagnosis not present

## 2018-06-06 DIAGNOSIS — K219 Gastro-esophageal reflux disease without esophagitis: Secondary | ICD-10-CM | POA: Diagnosis not present

## 2018-06-06 DIAGNOSIS — G4733 Obstructive sleep apnea (adult) (pediatric): Secondary | ICD-10-CM | POA: Diagnosis not present

## 2018-06-06 DIAGNOSIS — Z7951 Long term (current) use of inhaled steroids: Secondary | ICD-10-CM | POA: Diagnosis not present

## 2018-06-06 DIAGNOSIS — Z9981 Dependence on supplemental oxygen: Secondary | ICD-10-CM | POA: Diagnosis not present

## 2018-06-06 DIAGNOSIS — H409 Unspecified glaucoma: Secondary | ICD-10-CM | POA: Diagnosis not present

## 2018-06-06 DIAGNOSIS — J449 Chronic obstructive pulmonary disease, unspecified: Secondary | ICD-10-CM | POA: Diagnosis not present

## 2018-06-06 DIAGNOSIS — K5730 Diverticulosis of large intestine without perforation or abscess without bleeding: Secondary | ICD-10-CM | POA: Diagnosis not present

## 2018-06-06 DIAGNOSIS — K228 Other specified diseases of esophagus: Secondary | ICD-10-CM | POA: Diagnosis not present

## 2018-06-06 DIAGNOSIS — E1122 Type 2 diabetes mellitus with diabetic chronic kidney disease: Secondary | ICD-10-CM | POA: Diagnosis not present

## 2018-06-06 DIAGNOSIS — G4700 Insomnia, unspecified: Secondary | ICD-10-CM | POA: Diagnosis not present

## 2018-06-06 DIAGNOSIS — E079 Disorder of thyroid, unspecified: Secondary | ICD-10-CM | POA: Diagnosis not present

## 2018-06-06 DIAGNOSIS — I130 Hypertensive heart and chronic kidney disease with heart failure and stage 1 through stage 4 chronic kidney disease, or unspecified chronic kidney disease: Secondary | ICD-10-CM | POA: Diagnosis not present

## 2018-06-06 DIAGNOSIS — J9610 Chronic respiratory failure, unspecified whether with hypoxia or hypercapnia: Secondary | ICD-10-CM | POA: Diagnosis not present

## 2018-06-06 DIAGNOSIS — E785 Hyperlipidemia, unspecified: Secondary | ICD-10-CM | POA: Diagnosis not present

## 2018-06-06 DIAGNOSIS — N183 Chronic kidney disease, stage 3 (moderate): Secondary | ICD-10-CM | POA: Diagnosis not present

## 2018-06-06 DIAGNOSIS — R1310 Dysphagia, unspecified: Secondary | ICD-10-CM | POA: Diagnosis not present

## 2018-06-06 DIAGNOSIS — M519 Unspecified thoracic, thoracolumbar and lumbosacral intervertebral disc disorder: Secondary | ICD-10-CM | POA: Diagnosis not present

## 2018-06-06 DIAGNOSIS — Z7982 Long term (current) use of aspirin: Secondary | ICD-10-CM | POA: Diagnosis not present

## 2018-06-06 DIAGNOSIS — D631 Anemia in chronic kidney disease: Secondary | ICD-10-CM | POA: Diagnosis not present

## 2018-06-06 DIAGNOSIS — M4316 Spondylolisthesis, lumbar region: Secondary | ICD-10-CM | POA: Diagnosis not present

## 2018-06-07 ENCOUNTER — Telehealth: Payer: Self-pay | Admitting: Internal Medicine

## 2018-06-07 ENCOUNTER — Other Ambulatory Visit: Payer: Self-pay

## 2018-06-07 DIAGNOSIS — I319 Disease of pericardium, unspecified: Secondary | ICD-10-CM

## 2018-06-07 DIAGNOSIS — R768 Other specified abnormal immunological findings in serum: Secondary | ICD-10-CM

## 2018-06-07 NOTE — Telephone Encounter (Signed)
LMOM for Sherri w/ verbal orders.

## 2018-06-07 NOTE — Telephone Encounter (Signed)
Copied from [place] [id number]. Topic: Quick Communication - Home Health Verbal Orders >> Jun 07, 2018  1:13 PM Conception Chancy, NT wrote: Caller/Agency: Pam[facility] Number: [phone] and ask to speak with Sherri Requesting OT/PT/Skilled Nursing/Social Work: Skilled Nursing Frequency: 2 week 3 and 2 PRN visits

## 2018-06-08 ENCOUNTER — Telehealth: Payer: Self-pay | Admitting: Internal Medicine

## 2018-06-08 DIAGNOSIS — M4316 Spondylolisthesis, lumbar region: Secondary | ICD-10-CM | POA: Diagnosis not present

## 2018-06-08 DIAGNOSIS — G4700 Insomnia, unspecified: Secondary | ICD-10-CM | POA: Diagnosis not present

## 2018-06-08 DIAGNOSIS — K219 Gastro-esophageal reflux disease without esophagitis: Secondary | ICD-10-CM | POA: Diagnosis not present

## 2018-06-08 DIAGNOSIS — K228 Other specified diseases of esophagus: Secondary | ICD-10-CM | POA: Diagnosis not present

## 2018-06-08 DIAGNOSIS — E785 Hyperlipidemia, unspecified: Secondary | ICD-10-CM | POA: Diagnosis not present

## 2018-06-08 DIAGNOSIS — D631 Anemia in chronic kidney disease: Secondary | ICD-10-CM | POA: Diagnosis not present

## 2018-06-08 DIAGNOSIS — J9610 Chronic respiratory failure, unspecified whether with hypoxia or hypercapnia: Secondary | ICD-10-CM | POA: Diagnosis not present

## 2018-06-08 DIAGNOSIS — N183 Chronic kidney disease, stage 3 (moderate): Secondary | ICD-10-CM | POA: Diagnosis not present

## 2018-06-08 DIAGNOSIS — H409 Unspecified glaucoma: Secondary | ICD-10-CM | POA: Diagnosis not present

## 2018-06-08 DIAGNOSIS — G4733 Obstructive sleep apnea (adult) (pediatric): Secondary | ICD-10-CM | POA: Diagnosis not present

## 2018-06-08 DIAGNOSIS — Z9981 Dependence on supplemental oxygen: Secondary | ICD-10-CM | POA: Diagnosis not present

## 2018-06-08 DIAGNOSIS — Z7982 Long term (current) use of aspirin: Secondary | ICD-10-CM | POA: Diagnosis not present

## 2018-06-08 DIAGNOSIS — J449 Chronic obstructive pulmonary disease, unspecified: Secondary | ICD-10-CM | POA: Diagnosis not present

## 2018-06-08 DIAGNOSIS — I509 Heart failure, unspecified: Secondary | ICD-10-CM | POA: Diagnosis not present

## 2018-06-08 DIAGNOSIS — E079 Disorder of thyroid, unspecified: Secondary | ICD-10-CM | POA: Diagnosis not present

## 2018-06-08 DIAGNOSIS — K5730 Diverticulosis of large intestine without perforation or abscess without bleeding: Secondary | ICD-10-CM | POA: Diagnosis not present

## 2018-06-08 DIAGNOSIS — I130 Hypertensive heart and chronic kidney disease with heart failure and stage 1 through stage 4 chronic kidney disease, or unspecified chronic kidney disease: Secondary | ICD-10-CM | POA: Diagnosis not present

## 2018-06-08 DIAGNOSIS — Z7951 Long term (current) use of inhaled steroids: Secondary | ICD-10-CM | POA: Diagnosis not present

## 2018-06-08 DIAGNOSIS — R1310 Dysphagia, unspecified: Secondary | ICD-10-CM | POA: Diagnosis not present

## 2018-06-08 DIAGNOSIS — E1122 Type 2 diabetes mellitus with diabetic chronic kidney disease: Secondary | ICD-10-CM | POA: Diagnosis not present

## 2018-06-08 DIAGNOSIS — M519 Unspecified thoracic, thoracolumbar and lumbosacral intervertebral disc disorder: Secondary | ICD-10-CM | POA: Diagnosis not present

## 2018-06-08 NOTE — Telephone Encounter (Signed)
Copied from [place] [id number]. Topic: Quick Communication - Home Health Verbal Orders >> Jun 08, 2018  3:26 PM Carolyn Stare wrote:  Caller/Agency   Adriane with [facility]   Callback Number [place] verbal PT  Frequency    :2 x 4

## 2018-06-08 NOTE — Telephone Encounter (Signed)
Spoke w/ Adrianne, verbal orders given.

## 2018-06-09 ENCOUNTER — Other Ambulatory Visit: Payer: Self-pay | Admitting: Physician Assistant

## 2018-06-09 ENCOUNTER — Ambulatory Visit: Payer: Self-pay

## 2018-06-09 ENCOUNTER — Telehealth: Payer: Self-pay | Admitting: Internal Medicine

## 2018-06-09 ENCOUNTER — Other Ambulatory Visit: Payer: Self-pay | Admitting: Internal Medicine

## 2018-06-09 DIAGNOSIS — E785 Hyperlipidemia, unspecified: Secondary | ICD-10-CM | POA: Diagnosis not present

## 2018-06-09 DIAGNOSIS — Z9981 Dependence on supplemental oxygen: Secondary | ICD-10-CM | POA: Diagnosis not present

## 2018-06-09 DIAGNOSIS — K228 Other specified diseases of esophagus: Secondary | ICD-10-CM | POA: Diagnosis not present

## 2018-06-09 DIAGNOSIS — E079 Disorder of thyroid, unspecified: Secondary | ICD-10-CM | POA: Diagnosis not present

## 2018-06-09 DIAGNOSIS — N183 Chronic kidney disease, stage 3 (moderate): Secondary | ICD-10-CM | POA: Diagnosis not present

## 2018-06-09 DIAGNOSIS — K219 Gastro-esophageal reflux disease without esophagitis: Secondary | ICD-10-CM | POA: Diagnosis not present

## 2018-06-09 DIAGNOSIS — E1122 Type 2 diabetes mellitus with diabetic chronic kidney disease: Secondary | ICD-10-CM | POA: Diagnosis not present

## 2018-06-09 DIAGNOSIS — Z7982 Long term (current) use of aspirin: Secondary | ICD-10-CM | POA: Diagnosis not present

## 2018-06-09 DIAGNOSIS — H409 Unspecified glaucoma: Secondary | ICD-10-CM | POA: Diagnosis not present

## 2018-06-09 DIAGNOSIS — M519 Unspecified thoracic, thoracolumbar and lumbosacral intervertebral disc disorder: Secondary | ICD-10-CM | POA: Diagnosis not present

## 2018-06-09 DIAGNOSIS — J449 Chronic obstructive pulmonary disease, unspecified: Secondary | ICD-10-CM | POA: Diagnosis not present

## 2018-06-09 DIAGNOSIS — Z7951 Long term (current) use of inhaled steroids: Secondary | ICD-10-CM | POA: Diagnosis not present

## 2018-06-09 DIAGNOSIS — G4733 Obstructive sleep apnea (adult) (pediatric): Secondary | ICD-10-CM | POA: Diagnosis not present

## 2018-06-09 DIAGNOSIS — R1310 Dysphagia, unspecified: Secondary | ICD-10-CM | POA: Diagnosis not present

## 2018-06-09 DIAGNOSIS — I509 Heart failure, unspecified: Secondary | ICD-10-CM | POA: Diagnosis not present

## 2018-06-09 DIAGNOSIS — D631 Anemia in chronic kidney disease: Secondary | ICD-10-CM | POA: Diagnosis not present

## 2018-06-09 DIAGNOSIS — G4700 Insomnia, unspecified: Secondary | ICD-10-CM | POA: Diagnosis not present

## 2018-06-09 DIAGNOSIS — I130 Hypertensive heart and chronic kidney disease with heart failure and stage 1 through stage 4 chronic kidney disease, or unspecified chronic kidney disease: Secondary | ICD-10-CM | POA: Diagnosis not present

## 2018-06-09 DIAGNOSIS — M4316 Spondylolisthesis, lumbar region: Secondary | ICD-10-CM | POA: Diagnosis not present

## 2018-06-09 DIAGNOSIS — J9610 Chronic respiratory failure, unspecified whether with hypoxia or hypercapnia: Secondary | ICD-10-CM | POA: Diagnosis not present

## 2018-06-09 DIAGNOSIS — K5730 Diverticulosis of large intestine without perforation or abscess without bleeding: Secondary | ICD-10-CM | POA: Diagnosis not present

## 2018-06-09 MED ORDER — AMLODIPINE BESYLATE 5 MG PO TABS: 5.0000 mg | ORAL_TABLET | Freq: Every day | ORAL | 0 refills | Status: DC

## 2018-06-09 MED ORDER — METOPROLOL TARTRATE 25 MG PO TABS: ORAL_TABLET | ORAL | 1 refills | Status: DC

## 2018-06-09 NOTE — Telephone Encounter (Signed)
Copied from [place] [id number]. Topic: Quick Communication - Home Health Verbal Orders >> Jun 09, 2018 10:57 AM Bea Graff, NT wrote: Caller/AgencyJustice Deeds, [facility] Number: [phone] Requesting OT/PT/Skilled Nursing/Social Work: Speech Therapy Frequency: 1 time a week for 1 week, 2 times a week for 4 weeks   Also reports pts BP at visit is 161/102 no symptoms.

## 2018-06-09 NOTE — Telephone Encounter (Signed)
[name] OT is calling and her BP now is 180/112. Pt has ha this morning call transfer to [facility] triage nurse

## 2018-06-09 NOTE — Telephone Encounter (Signed)
Received message from Physician during this call. .  See chart for documentation.

## 2018-06-09 NOTE — Telephone Encounter (Signed)
Spoke w/ Jim- verbal orders given.  

## 2018-06-09 NOTE — Telephone Encounter (Signed)
Spoke w/ Pt- informed of recommendations. Pt verbalized understanding. Amlodipine and metoprolol refilled.

## 2018-06-09 NOTE — Telephone Encounter (Signed)
Increase metoprolol 25 mg: 2 tablets B.I.D. If she has symptoms with elevated BP such as  chest pain, headache, dizziness: Needs ER Ask patient to call with BP readings in 2 days

## 2018-06-09 NOTE — Telephone Encounter (Signed)
Please update medication list with metoprolol 2 tablets twice daily Advised patient will start amlodipine 5 mg 1 p.o. daily #30 3 refills. Call with BPs in few days.  ER if symptoms.  Watch for lower extremity edema.

## 2018-06-09 NOTE — Telephone Encounter (Signed)
Spoke w/ Clair Gulling- informed that Pt is already taking metoprolol 25mg  2 tablets twice daily. Pt is aware to go to ED if she exhibits high BP symptoms. Pt was also made aware of lab results.

## 2018-06-09 NOTE — Telephone Encounter (Signed)
Copied from [place]. Topic: Quick Communication - Home Health Verbal Orders >> Jun 09, 2018  2:10 PM Lennox Solders wrote: Caller/Agency:jim bell OT with [facility] Callback Number: [phone] Requesting OT 1x1, 2x2 and finish with 1x1

## 2018-06-09 NOTE — Telephone Encounter (Signed)
Please advise regarding elevated BP.

## 2018-06-10 ENCOUNTER — Telehealth: Payer: Self-pay | Admitting: Internal Medicine

## 2018-06-10 DIAGNOSIS — G4733 Obstructive sleep apnea (adult) (pediatric): Secondary | ICD-10-CM | POA: Diagnosis not present

## 2018-06-10 DIAGNOSIS — K228 Other specified diseases of esophagus: Secondary | ICD-10-CM | POA: Diagnosis not present

## 2018-06-10 DIAGNOSIS — N183 Chronic kidney disease, stage 3 (moderate): Secondary | ICD-10-CM | POA: Diagnosis not present

## 2018-06-10 DIAGNOSIS — I509 Heart failure, unspecified: Secondary | ICD-10-CM | POA: Diagnosis not present

## 2018-06-10 DIAGNOSIS — E785 Hyperlipidemia, unspecified: Secondary | ICD-10-CM | POA: Diagnosis not present

## 2018-06-10 DIAGNOSIS — R1310 Dysphagia, unspecified: Secondary | ICD-10-CM | POA: Diagnosis not present

## 2018-06-10 DIAGNOSIS — E079 Disorder of thyroid, unspecified: Secondary | ICD-10-CM | POA: Diagnosis not present

## 2018-06-10 DIAGNOSIS — K219 Gastro-esophageal reflux disease without esophagitis: Secondary | ICD-10-CM | POA: Diagnosis not present

## 2018-06-10 DIAGNOSIS — K5730 Diverticulosis of large intestine without perforation or abscess without bleeding: Secondary | ICD-10-CM | POA: Diagnosis not present

## 2018-06-10 DIAGNOSIS — G4700 Insomnia, unspecified: Secondary | ICD-10-CM | POA: Diagnosis not present

## 2018-06-10 DIAGNOSIS — D631 Anemia in chronic kidney disease: Secondary | ICD-10-CM | POA: Diagnosis not present

## 2018-06-10 DIAGNOSIS — Z7982 Long term (current) use of aspirin: Secondary | ICD-10-CM | POA: Diagnosis not present

## 2018-06-10 DIAGNOSIS — E1122 Type 2 diabetes mellitus with diabetic chronic kidney disease: Secondary | ICD-10-CM | POA: Diagnosis not present

## 2018-06-10 DIAGNOSIS — J9610 Chronic respiratory failure, unspecified whether with hypoxia or hypercapnia: Secondary | ICD-10-CM | POA: Diagnosis not present

## 2018-06-10 DIAGNOSIS — M519 Unspecified thoracic, thoracolumbar and lumbosacral intervertebral disc disorder: Secondary | ICD-10-CM | POA: Diagnosis not present

## 2018-06-10 DIAGNOSIS — Z7951 Long term (current) use of inhaled steroids: Secondary | ICD-10-CM | POA: Diagnosis not present

## 2018-06-10 DIAGNOSIS — M4316 Spondylolisthesis, lumbar region: Secondary | ICD-10-CM | POA: Diagnosis not present

## 2018-06-10 DIAGNOSIS — Z9981 Dependence on supplemental oxygen: Secondary | ICD-10-CM | POA: Diagnosis not present

## 2018-06-10 DIAGNOSIS — H409 Unspecified glaucoma: Secondary | ICD-10-CM | POA: Diagnosis not present

## 2018-06-10 DIAGNOSIS — I130 Hypertensive heart and chronic kidney disease with heart failure and stage 1 through stage 4 chronic kidney disease, or unspecified chronic kidney disease: Secondary | ICD-10-CM | POA: Diagnosis not present

## 2018-06-10 DIAGNOSIS — J449 Chronic obstructive pulmonary disease, unspecified: Secondary | ICD-10-CM | POA: Diagnosis not present

## 2018-06-10 NOTE — Telephone Encounter (Signed)
BP better 

## 2018-06-10 NOTE — Telephone Encounter (Signed)
Melissa Stegall, RN, [facility], called with pt's blood pressure; 06/10/2018 at 1025 it was 144/90 on left upper arm; pt took medication 2 hours prior; value red back for accuracy; will route to office for notification.

## 2018-06-12 DIAGNOSIS — R0902 Hypoxemia: Secondary | ICD-10-CM | POA: Diagnosis not present

## 2018-06-12 DIAGNOSIS — G4733 Obstructive sleep apnea (adult) (pediatric): Secondary | ICD-10-CM | POA: Diagnosis not present

## 2018-06-14 ENCOUNTER — Telehealth: Payer: Self-pay | Admitting: *Deleted

## 2018-06-14 DIAGNOSIS — E1122 Type 2 diabetes mellitus with diabetic chronic kidney disease: Secondary | ICD-10-CM | POA: Diagnosis not present

## 2018-06-14 DIAGNOSIS — I509 Heart failure, unspecified: Secondary | ICD-10-CM | POA: Diagnosis not present

## 2018-06-14 DIAGNOSIS — Z7982 Long term (current) use of aspirin: Secondary | ICD-10-CM | POA: Diagnosis not present

## 2018-06-14 DIAGNOSIS — E785 Hyperlipidemia, unspecified: Secondary | ICD-10-CM | POA: Diagnosis not present

## 2018-06-14 DIAGNOSIS — R1310 Dysphagia, unspecified: Secondary | ICD-10-CM | POA: Diagnosis not present

## 2018-06-14 DIAGNOSIS — G4700 Insomnia, unspecified: Secondary | ICD-10-CM | POA: Diagnosis not present

## 2018-06-14 DIAGNOSIS — N183 Chronic kidney disease, stage 3 (moderate): Secondary | ICD-10-CM | POA: Diagnosis not present

## 2018-06-14 DIAGNOSIS — K219 Gastro-esophageal reflux disease without esophagitis: Secondary | ICD-10-CM | POA: Diagnosis not present

## 2018-06-14 DIAGNOSIS — J9610 Chronic respiratory failure, unspecified whether with hypoxia or hypercapnia: Secondary | ICD-10-CM | POA: Diagnosis not present

## 2018-06-14 DIAGNOSIS — M519 Unspecified thoracic, thoracolumbar and lumbosacral intervertebral disc disorder: Secondary | ICD-10-CM | POA: Diagnosis not present

## 2018-06-14 DIAGNOSIS — J449 Chronic obstructive pulmonary disease, unspecified: Secondary | ICD-10-CM | POA: Diagnosis not present

## 2018-06-14 DIAGNOSIS — H409 Unspecified glaucoma: Secondary | ICD-10-CM | POA: Diagnosis not present

## 2018-06-14 DIAGNOSIS — Z9981 Dependence on supplemental oxygen: Secondary | ICD-10-CM | POA: Diagnosis not present

## 2018-06-14 DIAGNOSIS — I130 Hypertensive heart and chronic kidney disease with heart failure and stage 1 through stage 4 chronic kidney disease, or unspecified chronic kidney disease: Secondary | ICD-10-CM | POA: Diagnosis not present

## 2018-06-14 DIAGNOSIS — E079 Disorder of thyroid, unspecified: Secondary | ICD-10-CM | POA: Diagnosis not present

## 2018-06-14 DIAGNOSIS — K5730 Diverticulosis of large intestine without perforation or abscess without bleeding: Secondary | ICD-10-CM | POA: Diagnosis not present

## 2018-06-14 DIAGNOSIS — M4316 Spondylolisthesis, lumbar region: Secondary | ICD-10-CM | POA: Diagnosis not present

## 2018-06-14 DIAGNOSIS — Z7951 Long term (current) use of inhaled steroids: Secondary | ICD-10-CM | POA: Diagnosis not present

## 2018-06-14 DIAGNOSIS — G4733 Obstructive sleep apnea (adult) (pediatric): Secondary | ICD-10-CM | POA: Diagnosis not present

## 2018-06-14 DIAGNOSIS — D631 Anemia in chronic kidney disease: Secondary | ICD-10-CM | POA: Diagnosis not present

## 2018-06-14 DIAGNOSIS — K228 Other specified diseases of esophagus: Secondary | ICD-10-CM | POA: Diagnosis not present

## 2018-06-14 NOTE — Telephone Encounter (Signed)
Received Physician Orders from [facility]; forwarded to provider/SLS 02/03

## 2018-06-15 DIAGNOSIS — J449 Chronic obstructive pulmonary disease, unspecified: Secondary | ICD-10-CM | POA: Diagnosis not present

## 2018-06-15 DIAGNOSIS — E1122 Type 2 diabetes mellitus with diabetic chronic kidney disease: Secondary | ICD-10-CM | POA: Diagnosis not present

## 2018-06-15 DIAGNOSIS — N183 Chronic kidney disease, stage 3 (moderate): Secondary | ICD-10-CM | POA: Diagnosis not present

## 2018-06-15 DIAGNOSIS — K5730 Diverticulosis of large intestine without perforation or abscess without bleeding: Secondary | ICD-10-CM | POA: Diagnosis not present

## 2018-06-15 DIAGNOSIS — I509 Heart failure, unspecified: Secondary | ICD-10-CM | POA: Diagnosis not present

## 2018-06-15 DIAGNOSIS — E079 Disorder of thyroid, unspecified: Secondary | ICD-10-CM | POA: Diagnosis not present

## 2018-06-15 DIAGNOSIS — Z7951 Long term (current) use of inhaled steroids: Secondary | ICD-10-CM | POA: Diagnosis not present

## 2018-06-15 DIAGNOSIS — I130 Hypertensive heart and chronic kidney disease with heart failure and stage 1 through stage 4 chronic kidney disease, or unspecified chronic kidney disease: Secondary | ICD-10-CM | POA: Diagnosis not present

## 2018-06-15 DIAGNOSIS — H409 Unspecified glaucoma: Secondary | ICD-10-CM | POA: Diagnosis not present

## 2018-06-15 DIAGNOSIS — Z7982 Long term (current) use of aspirin: Secondary | ICD-10-CM | POA: Diagnosis not present

## 2018-06-15 DIAGNOSIS — G4733 Obstructive sleep apnea (adult) (pediatric): Secondary | ICD-10-CM | POA: Diagnosis not present

## 2018-06-15 DIAGNOSIS — Z9981 Dependence on supplemental oxygen: Secondary | ICD-10-CM | POA: Diagnosis not present

## 2018-06-15 DIAGNOSIS — M4316 Spondylolisthesis, lumbar region: Secondary | ICD-10-CM | POA: Diagnosis not present

## 2018-06-15 DIAGNOSIS — R1310 Dysphagia, unspecified: Secondary | ICD-10-CM | POA: Diagnosis not present

## 2018-06-15 DIAGNOSIS — K228 Other specified diseases of esophagus: Secondary | ICD-10-CM | POA: Diagnosis not present

## 2018-06-15 DIAGNOSIS — E785 Hyperlipidemia, unspecified: Secondary | ICD-10-CM | POA: Diagnosis not present

## 2018-06-15 DIAGNOSIS — G4700 Insomnia, unspecified: Secondary | ICD-10-CM | POA: Diagnosis not present

## 2018-06-15 DIAGNOSIS — K219 Gastro-esophageal reflux disease without esophagitis: Secondary | ICD-10-CM | POA: Diagnosis not present

## 2018-06-15 DIAGNOSIS — M519 Unspecified thoracic, thoracolumbar and lumbosacral intervertebral disc disorder: Secondary | ICD-10-CM | POA: Diagnosis not present

## 2018-06-15 DIAGNOSIS — J9610 Chronic respiratory failure, unspecified whether with hypoxia or hypercapnia: Secondary | ICD-10-CM | POA: Diagnosis not present

## 2018-06-15 DIAGNOSIS — D631 Anemia in chronic kidney disease: Secondary | ICD-10-CM | POA: Diagnosis not present

## 2018-06-16 DIAGNOSIS — J449 Chronic obstructive pulmonary disease, unspecified: Secondary | ICD-10-CM | POA: Diagnosis not present

## 2018-06-16 DIAGNOSIS — Z7982 Long term (current) use of aspirin: Secondary | ICD-10-CM | POA: Diagnosis not present

## 2018-06-16 DIAGNOSIS — M4316 Spondylolisthesis, lumbar region: Secondary | ICD-10-CM | POA: Diagnosis not present

## 2018-06-16 DIAGNOSIS — E1122 Type 2 diabetes mellitus with diabetic chronic kidney disease: Secondary | ICD-10-CM | POA: Diagnosis not present

## 2018-06-16 DIAGNOSIS — E785 Hyperlipidemia, unspecified: Secondary | ICD-10-CM | POA: Diagnosis not present

## 2018-06-16 DIAGNOSIS — K219 Gastro-esophageal reflux disease without esophagitis: Secondary | ICD-10-CM | POA: Diagnosis not present

## 2018-06-16 DIAGNOSIS — E079 Disorder of thyroid, unspecified: Secondary | ICD-10-CM | POA: Diagnosis not present

## 2018-06-16 DIAGNOSIS — J9610 Chronic respiratory failure, unspecified whether with hypoxia or hypercapnia: Secondary | ICD-10-CM | POA: Diagnosis not present

## 2018-06-16 DIAGNOSIS — M519 Unspecified thoracic, thoracolumbar and lumbosacral intervertebral disc disorder: Secondary | ICD-10-CM | POA: Diagnosis not present

## 2018-06-16 DIAGNOSIS — G4733 Obstructive sleep apnea (adult) (pediatric): Secondary | ICD-10-CM | POA: Diagnosis not present

## 2018-06-16 DIAGNOSIS — I130 Hypertensive heart and chronic kidney disease with heart failure and stage 1 through stage 4 chronic kidney disease, or unspecified chronic kidney disease: Secondary | ICD-10-CM | POA: Diagnosis not present

## 2018-06-16 DIAGNOSIS — N183 Chronic kidney disease, stage 3 (moderate): Secondary | ICD-10-CM | POA: Diagnosis not present

## 2018-06-16 DIAGNOSIS — K228 Other specified diseases of esophagus: Secondary | ICD-10-CM | POA: Diagnosis not present

## 2018-06-16 DIAGNOSIS — Z9981 Dependence on supplemental oxygen: Secondary | ICD-10-CM | POA: Diagnosis not present

## 2018-06-16 DIAGNOSIS — K5730 Diverticulosis of large intestine without perforation or abscess without bleeding: Secondary | ICD-10-CM | POA: Diagnosis not present

## 2018-06-16 DIAGNOSIS — D631 Anemia in chronic kidney disease: Secondary | ICD-10-CM | POA: Diagnosis not present

## 2018-06-16 DIAGNOSIS — I509 Heart failure, unspecified: Secondary | ICD-10-CM | POA: Diagnosis not present

## 2018-06-16 DIAGNOSIS — H409 Unspecified glaucoma: Secondary | ICD-10-CM | POA: Diagnosis not present

## 2018-06-16 DIAGNOSIS — G4700 Insomnia, unspecified: Secondary | ICD-10-CM | POA: Diagnosis not present

## 2018-06-16 DIAGNOSIS — Z7951 Long term (current) use of inhaled steroids: Secondary | ICD-10-CM | POA: Diagnosis not present

## 2018-06-16 DIAGNOSIS — R1310 Dysphagia, unspecified: Secondary | ICD-10-CM | POA: Diagnosis not present

## 2018-06-16 NOTE — Telephone Encounter (Signed)
Orders signed and faxed to [facility]- [phone]. Form sent for scanning.

## 2018-06-17 DIAGNOSIS — J9610 Chronic respiratory failure, unspecified whether with hypoxia or hypercapnia: Secondary | ICD-10-CM | POA: Diagnosis not present

## 2018-06-17 DIAGNOSIS — M519 Unspecified thoracic, thoracolumbar and lumbosacral intervertebral disc disorder: Secondary | ICD-10-CM | POA: Diagnosis not present

## 2018-06-17 DIAGNOSIS — M4316 Spondylolisthesis, lumbar region: Secondary | ICD-10-CM | POA: Diagnosis not present

## 2018-06-17 DIAGNOSIS — G4733 Obstructive sleep apnea (adult) (pediatric): Secondary | ICD-10-CM | POA: Diagnosis not present

## 2018-06-17 DIAGNOSIS — H409 Unspecified glaucoma: Secondary | ICD-10-CM | POA: Diagnosis not present

## 2018-06-17 DIAGNOSIS — K219 Gastro-esophageal reflux disease without esophagitis: Secondary | ICD-10-CM | POA: Diagnosis not present

## 2018-06-17 DIAGNOSIS — I130 Hypertensive heart and chronic kidney disease with heart failure and stage 1 through stage 4 chronic kidney disease, or unspecified chronic kidney disease: Secondary | ICD-10-CM | POA: Diagnosis not present

## 2018-06-17 DIAGNOSIS — G4700 Insomnia, unspecified: Secondary | ICD-10-CM | POA: Diagnosis not present

## 2018-06-17 DIAGNOSIS — R1310 Dysphagia, unspecified: Secondary | ICD-10-CM | POA: Diagnosis not present

## 2018-06-17 DIAGNOSIS — Z7982 Long term (current) use of aspirin: Secondary | ICD-10-CM | POA: Diagnosis not present

## 2018-06-17 DIAGNOSIS — K5730 Diverticulosis of large intestine without perforation or abscess without bleeding: Secondary | ICD-10-CM | POA: Diagnosis not present

## 2018-06-17 DIAGNOSIS — K228 Other specified diseases of esophagus: Secondary | ICD-10-CM | POA: Diagnosis not present

## 2018-06-17 DIAGNOSIS — E1122 Type 2 diabetes mellitus with diabetic chronic kidney disease: Secondary | ICD-10-CM | POA: Diagnosis not present

## 2018-06-17 DIAGNOSIS — D631 Anemia in chronic kidney disease: Secondary | ICD-10-CM | POA: Diagnosis not present

## 2018-06-17 DIAGNOSIS — E785 Hyperlipidemia, unspecified: Secondary | ICD-10-CM | POA: Diagnosis not present

## 2018-06-17 DIAGNOSIS — I509 Heart failure, unspecified: Secondary | ICD-10-CM | POA: Diagnosis not present

## 2018-06-17 DIAGNOSIS — N183 Chronic kidney disease, stage 3 (moderate): Secondary | ICD-10-CM | POA: Diagnosis not present

## 2018-06-17 DIAGNOSIS — Z7951 Long term (current) use of inhaled steroids: Secondary | ICD-10-CM | POA: Diagnosis not present

## 2018-06-17 DIAGNOSIS — Z9981 Dependence on supplemental oxygen: Secondary | ICD-10-CM | POA: Diagnosis not present

## 2018-06-17 DIAGNOSIS — E079 Disorder of thyroid, unspecified: Secondary | ICD-10-CM | POA: Diagnosis not present

## 2018-06-17 DIAGNOSIS — J449 Chronic obstructive pulmonary disease, unspecified: Secondary | ICD-10-CM | POA: Diagnosis not present

## 2018-06-20 DIAGNOSIS — J9611 Chronic respiratory failure with hypoxia: Secondary | ICD-10-CM | POA: Diagnosis not present

## 2018-06-20 DIAGNOSIS — I2510 Atherosclerotic heart disease of native coronary artery without angina pectoris: Secondary | ICD-10-CM | POA: Diagnosis not present

## 2018-06-20 DIAGNOSIS — I509 Heart failure, unspecified: Secondary | ICD-10-CM | POA: Diagnosis not present

## 2018-06-21 ENCOUNTER — Telehealth: Payer: Self-pay | Admitting: *Deleted

## 2018-06-21 ENCOUNTER — Telehealth: Payer: Self-pay | Admitting: Internal Medicine

## 2018-06-21 DIAGNOSIS — G4733 Obstructive sleep apnea (adult) (pediatric): Secondary | ICD-10-CM | POA: Diagnosis not present

## 2018-06-21 DIAGNOSIS — K219 Gastro-esophageal reflux disease without esophagitis: Secondary | ICD-10-CM | POA: Diagnosis not present

## 2018-06-21 DIAGNOSIS — H409 Unspecified glaucoma: Secondary | ICD-10-CM | POA: Diagnosis not present

## 2018-06-21 DIAGNOSIS — D631 Anemia in chronic kidney disease: Secondary | ICD-10-CM | POA: Diagnosis not present

## 2018-06-21 DIAGNOSIS — I509 Heart failure, unspecified: Secondary | ICD-10-CM | POA: Diagnosis not present

## 2018-06-21 DIAGNOSIS — Z7951 Long term (current) use of inhaled steroids: Secondary | ICD-10-CM | POA: Diagnosis not present

## 2018-06-21 DIAGNOSIS — M4316 Spondylolisthesis, lumbar region: Secondary | ICD-10-CM | POA: Diagnosis not present

## 2018-06-21 DIAGNOSIS — G4700 Insomnia, unspecified: Secondary | ICD-10-CM | POA: Diagnosis not present

## 2018-06-21 DIAGNOSIS — I130 Hypertensive heart and chronic kidney disease with heart failure and stage 1 through stage 4 chronic kidney disease, or unspecified chronic kidney disease: Secondary | ICD-10-CM | POA: Diagnosis not present

## 2018-06-21 DIAGNOSIS — K5730 Diverticulosis of large intestine without perforation or abscess without bleeding: Secondary | ICD-10-CM | POA: Diagnosis not present

## 2018-06-21 DIAGNOSIS — J449 Chronic obstructive pulmonary disease, unspecified: Secondary | ICD-10-CM | POA: Diagnosis not present

## 2018-06-21 DIAGNOSIS — E079 Disorder of thyroid, unspecified: Secondary | ICD-10-CM | POA: Diagnosis not present

## 2018-06-21 DIAGNOSIS — J9610 Chronic respiratory failure, unspecified whether with hypoxia or hypercapnia: Secondary | ICD-10-CM | POA: Diagnosis not present

## 2018-06-21 DIAGNOSIS — M519 Unspecified thoracic, thoracolumbar and lumbosacral intervertebral disc disorder: Secondary | ICD-10-CM | POA: Diagnosis not present

## 2018-06-21 DIAGNOSIS — K228 Other specified diseases of esophagus: Secondary | ICD-10-CM | POA: Diagnosis not present

## 2018-06-21 DIAGNOSIS — Z7982 Long term (current) use of aspirin: Secondary | ICD-10-CM | POA: Diagnosis not present

## 2018-06-21 DIAGNOSIS — E1122 Type 2 diabetes mellitus with diabetic chronic kidney disease: Secondary | ICD-10-CM | POA: Diagnosis not present

## 2018-06-21 DIAGNOSIS — R1310 Dysphagia, unspecified: Secondary | ICD-10-CM | POA: Diagnosis not present

## 2018-06-21 DIAGNOSIS — N183 Chronic kidney disease, stage 3 (moderate): Secondary | ICD-10-CM | POA: Diagnosis not present

## 2018-06-21 DIAGNOSIS — E785 Hyperlipidemia, unspecified: Secondary | ICD-10-CM | POA: Diagnosis not present

## 2018-06-21 DIAGNOSIS — Z9981 Dependence on supplemental oxygen: Secondary | ICD-10-CM | POA: Diagnosis not present

## 2018-06-21 NOTE — Telephone Encounter (Signed)
Received Physician Orders from [facility]; forwarded to provider/SLS 02/10

## 2018-06-21 NOTE — Telephone Encounter (Signed)
Received Home Health Certification and Plan of Care; forwarded to provider/SLS 02/10 

## 2018-06-21 NOTE — Telephone Encounter (Signed)
Will forward message to GI, she has a extensive GI history, they may consider see her again.

## 2018-06-21 NOTE — Telephone Encounter (Signed)
Copied from [place] [id number]. Topic: Quick Communication - See Telephone Encounter >> Jun 21, 2018  2:29 PM Antonieta Iba C wrote: CRM for notification. See Telephone encounter for: 06/21/18.  Hershal Coria speech therapy is calling in to make provider aware that pt is having some severe heart burn and acid reflux which led to vomiting over the weekend. She said that pt says that this has been consistant for about a week since she has been seeing pt.   CB: [phone] - Speech Therapist w[facility]  She stated that PCP may want to make a change to pt's medication.

## 2018-06-21 NOTE — Telephone Encounter (Signed)
FYI

## 2018-06-21 NOTE — Telephone Encounter (Signed)
Happy to see the patient back in clinic with myself or one of our Advanced Practitioners. Rovonda, can you please see about finding a clinic visit for this patient with myself or with 1 of the PAs in the next 3 weeks. Thank you.

## 2018-06-22 ENCOUNTER — Ambulatory Visit: Payer: Medicare Other | Admitting: Physician Assistant

## 2018-06-22 DIAGNOSIS — I509 Heart failure, unspecified: Secondary | ICD-10-CM | POA: Diagnosis not present

## 2018-06-22 DIAGNOSIS — E1122 Type 2 diabetes mellitus with diabetic chronic kidney disease: Secondary | ICD-10-CM | POA: Diagnosis not present

## 2018-06-22 DIAGNOSIS — N183 Chronic kidney disease, stage 3 (moderate): Secondary | ICD-10-CM | POA: Diagnosis not present

## 2018-06-22 DIAGNOSIS — K228 Other specified diseases of esophagus: Secondary | ICD-10-CM | POA: Diagnosis not present

## 2018-06-22 DIAGNOSIS — E079 Disorder of thyroid, unspecified: Secondary | ICD-10-CM | POA: Diagnosis not present

## 2018-06-22 DIAGNOSIS — J449 Chronic obstructive pulmonary disease, unspecified: Secondary | ICD-10-CM | POA: Diagnosis not present

## 2018-06-22 DIAGNOSIS — G4700 Insomnia, unspecified: Secondary | ICD-10-CM | POA: Diagnosis not present

## 2018-06-22 DIAGNOSIS — G4733 Obstructive sleep apnea (adult) (pediatric): Secondary | ICD-10-CM | POA: Diagnosis not present

## 2018-06-22 DIAGNOSIS — Z9981 Dependence on supplemental oxygen: Secondary | ICD-10-CM | POA: Diagnosis not present

## 2018-06-22 DIAGNOSIS — Z7951 Long term (current) use of inhaled steroids: Secondary | ICD-10-CM | POA: Diagnosis not present

## 2018-06-22 DIAGNOSIS — D631 Anemia in chronic kidney disease: Secondary | ICD-10-CM | POA: Diagnosis not present

## 2018-06-22 DIAGNOSIS — M4316 Spondylolisthesis, lumbar region: Secondary | ICD-10-CM | POA: Diagnosis not present

## 2018-06-22 DIAGNOSIS — Z7982 Long term (current) use of aspirin: Secondary | ICD-10-CM | POA: Diagnosis not present

## 2018-06-22 DIAGNOSIS — K219 Gastro-esophageal reflux disease without esophagitis: Secondary | ICD-10-CM | POA: Diagnosis not present

## 2018-06-22 DIAGNOSIS — M519 Unspecified thoracic, thoracolumbar and lumbosacral intervertebral disc disorder: Secondary | ICD-10-CM | POA: Diagnosis not present

## 2018-06-22 DIAGNOSIS — H409 Unspecified glaucoma: Secondary | ICD-10-CM | POA: Diagnosis not present

## 2018-06-22 DIAGNOSIS — E785 Hyperlipidemia, unspecified: Secondary | ICD-10-CM | POA: Diagnosis not present

## 2018-06-22 DIAGNOSIS — I130 Hypertensive heart and chronic kidney disease with heart failure and stage 1 through stage 4 chronic kidney disease, or unspecified chronic kidney disease: Secondary | ICD-10-CM | POA: Diagnosis not present

## 2018-06-22 DIAGNOSIS — K5730 Diverticulosis of large intestine without perforation or abscess without bleeding: Secondary | ICD-10-CM | POA: Diagnosis not present

## 2018-06-22 DIAGNOSIS — J9610 Chronic respiratory failure, unspecified whether with hypoxia or hypercapnia: Secondary | ICD-10-CM | POA: Diagnosis not present

## 2018-06-22 DIAGNOSIS — R1310 Dysphagia, unspecified: Secondary | ICD-10-CM | POA: Diagnosis not present

## 2018-06-22 NOTE — Telephone Encounter (Signed)
Left msg for pt asking for rtn call.

## 2018-06-23 ENCOUNTER — Ambulatory Visit: Payer: Self-pay | Admitting: *Deleted

## 2018-06-23 DIAGNOSIS — G4700 Insomnia, unspecified: Secondary | ICD-10-CM | POA: Diagnosis not present

## 2018-06-23 DIAGNOSIS — Z8619 Personal history of other infectious and parasitic diseases: Secondary | ICD-10-CM

## 2018-06-23 DIAGNOSIS — R1310 Dysphagia, unspecified: Secondary | ICD-10-CM

## 2018-06-23 DIAGNOSIS — E079 Disorder of thyroid, unspecified: Secondary | ICD-10-CM

## 2018-06-23 DIAGNOSIS — I509 Heart failure, unspecified: Secondary | ICD-10-CM | POA: Diagnosis not present

## 2018-06-23 DIAGNOSIS — N183 Chronic kidney disease, stage 3 (moderate): Secondary | ICD-10-CM | POA: Diagnosis not present

## 2018-06-23 DIAGNOSIS — I130 Hypertensive heart and chronic kidney disease with heart failure and stage 1 through stage 4 chronic kidney disease, or unspecified chronic kidney disease: Secondary | ICD-10-CM | POA: Diagnosis not present

## 2018-06-23 DIAGNOSIS — Z6827 Body mass index (BMI) 27.0-27.9, adult: Secondary | ICD-10-CM

## 2018-06-23 DIAGNOSIS — K5730 Diverticulosis of large intestine without perforation or abscess without bleeding: Secondary | ICD-10-CM

## 2018-06-23 DIAGNOSIS — Z87891 Personal history of nicotine dependence: Secondary | ICD-10-CM

## 2018-06-23 DIAGNOSIS — H409 Unspecified glaucoma: Secondary | ICD-10-CM | POA: Diagnosis not present

## 2018-06-23 DIAGNOSIS — M4316 Spondylolisthesis, lumbar region: Secondary | ICD-10-CM | POA: Diagnosis not present

## 2018-06-23 DIAGNOSIS — K228 Other specified diseases of esophagus: Secondary | ICD-10-CM

## 2018-06-23 DIAGNOSIS — D631 Anemia in chronic kidney disease: Secondary | ICD-10-CM

## 2018-06-23 DIAGNOSIS — F329 Major depressive disorder, single episode, unspecified: Secondary | ICD-10-CM

## 2018-06-23 DIAGNOSIS — K219 Gastro-esophageal reflux disease without esophagitis: Secondary | ICD-10-CM

## 2018-06-23 DIAGNOSIS — G4733 Obstructive sleep apnea (adult) (pediatric): Secondary | ICD-10-CM

## 2018-06-23 DIAGNOSIS — Z96653 Presence of artificial knee joint, bilateral: Secondary | ICD-10-CM

## 2018-06-23 DIAGNOSIS — J449 Chronic obstructive pulmonary disease, unspecified: Secondary | ICD-10-CM

## 2018-06-23 DIAGNOSIS — Z9981 Dependence on supplemental oxygen: Secondary | ICD-10-CM

## 2018-06-23 DIAGNOSIS — Z9181 History of falling: Secondary | ICD-10-CM

## 2018-06-23 DIAGNOSIS — M519 Unspecified thoracic, thoracolumbar and lumbosacral intervertebral disc disorder: Secondary | ICD-10-CM

## 2018-06-23 DIAGNOSIS — Z7951 Long term (current) use of inhaled steroids: Secondary | ICD-10-CM

## 2018-06-23 DIAGNOSIS — J9610 Chronic respiratory failure, unspecified whether with hypoxia or hypercapnia: Secondary | ICD-10-CM

## 2018-06-23 DIAGNOSIS — Z7982 Long term (current) use of aspirin: Secondary | ICD-10-CM

## 2018-06-23 DIAGNOSIS — E785 Hyperlipidemia, unspecified: Secondary | ICD-10-CM | POA: Diagnosis not present

## 2018-06-23 DIAGNOSIS — E1122 Type 2 diabetes mellitus with diabetic chronic kidney disease: Secondary | ICD-10-CM | POA: Diagnosis not present

## 2018-06-23 DIAGNOSIS — Z87440 Personal history of urinary (tract) infections: Secondary | ICD-10-CM

## 2018-06-23 NOTE — Telephone Encounter (Signed)
Plan of care signed and faxed to [facility]- [phone]. Form sent for scanning.

## 2018-06-23 NOTE — Telephone Encounter (Signed)
Orders signed and faxed to [facility]- [phone]. Form sent for scanning.

## 2018-06-23 NOTE — Telephone Encounter (Signed)
FYI

## 2018-06-23 NOTE — Telephone Encounter (Signed)
'  [name]'  SLP with [facility] reports pt's BP 158/91 this afternoon.  Pt present during call. Pt reports headache 9/10, has not taken anything "Nothing at the house to take." Did take scheduled ASA 81mg . Denies any other symptoms. SLP states BP has been trending up...124/78   136/88   141/99   150/90. States is taking all meds as ordered, no missed doses. Also reports had called yesterday to report severe reflux; Dr. Larose Kells recommended GI who has responded to message. TN offered appt with Dr. Larose Kells R/T hypertension, declined stating "Too difficult to get in, no transportation". Assured TN would alert Dr. Larose Kells. Please advise: [phone]  Reason for Disposition . [2] Systolic BP  >= 725 OR Diastolic >= 80 AND [3] taking BP medications  Answer Assessment - Initial Assessment Questions 1. BLOOD PRESSURE: "What is the blood pressure?" "Did you take at least two measurements 5 minutes apart?"     158/91 at 1520. 2. ONSET: "When did you take your blood pressure?"     Today at 1520 3. HOW: "How did you obtain the blood pressure?" (e.g., visiting nurse, automatic home BP monitor)     Visiting SLP 4. HISTORY: "Do you have a history of high blood pressure?"     yes 5. MEDICATIONS: "Are you taking any medications for blood pressure?" "Have you missed any doses recently?"     no 6. OTHER SYMPTOMS: "Do you have any symptoms?" (e.g., headache, chest pain, blurred vision, difficulty breathing, weakness)    Headache 9/10. Didn't take anything but ASA 81mg , runny nose at times with O2.  Protocols used: HIGH BLOOD PRESSURE-A-AH

## 2018-06-24 DIAGNOSIS — K219 Gastro-esophageal reflux disease without esophagitis: Secondary | ICD-10-CM | POA: Diagnosis not present

## 2018-06-24 DIAGNOSIS — E079 Disorder of thyroid, unspecified: Secondary | ICD-10-CM | POA: Diagnosis not present

## 2018-06-24 DIAGNOSIS — I130 Hypertensive heart and chronic kidney disease with heart failure and stage 1 through stage 4 chronic kidney disease, or unspecified chronic kidney disease: Secondary | ICD-10-CM | POA: Diagnosis not present

## 2018-06-24 DIAGNOSIS — N183 Chronic kidney disease, stage 3 (moderate): Secondary | ICD-10-CM | POA: Diagnosis not present

## 2018-06-24 DIAGNOSIS — K5730 Diverticulosis of large intestine without perforation or abscess without bleeding: Secondary | ICD-10-CM | POA: Diagnosis not present

## 2018-06-24 DIAGNOSIS — M519 Unspecified thoracic, thoracolumbar and lumbosacral intervertebral disc disorder: Secondary | ICD-10-CM | POA: Diagnosis not present

## 2018-06-24 DIAGNOSIS — M4316 Spondylolisthesis, lumbar region: Secondary | ICD-10-CM | POA: Diagnosis not present

## 2018-06-24 DIAGNOSIS — H409 Unspecified glaucoma: Secondary | ICD-10-CM | POA: Diagnosis not present

## 2018-06-24 DIAGNOSIS — D631 Anemia in chronic kidney disease: Secondary | ICD-10-CM | POA: Diagnosis not present

## 2018-06-24 DIAGNOSIS — J9610 Chronic respiratory failure, unspecified whether with hypoxia or hypercapnia: Secondary | ICD-10-CM | POA: Diagnosis not present

## 2018-06-24 DIAGNOSIS — E1122 Type 2 diabetes mellitus with diabetic chronic kidney disease: Secondary | ICD-10-CM | POA: Diagnosis not present

## 2018-06-24 DIAGNOSIS — I509 Heart failure, unspecified: Secondary | ICD-10-CM | POA: Diagnosis not present

## 2018-06-24 DIAGNOSIS — Z7951 Long term (current) use of inhaled steroids: Secondary | ICD-10-CM | POA: Diagnosis not present

## 2018-06-24 DIAGNOSIS — G4733 Obstructive sleep apnea (adult) (pediatric): Secondary | ICD-10-CM | POA: Diagnosis not present

## 2018-06-24 DIAGNOSIS — Z9981 Dependence on supplemental oxygen: Secondary | ICD-10-CM | POA: Diagnosis not present

## 2018-06-24 DIAGNOSIS — G4700 Insomnia, unspecified: Secondary | ICD-10-CM | POA: Diagnosis not present

## 2018-06-24 DIAGNOSIS — K228 Other specified diseases of esophagus: Secondary | ICD-10-CM | POA: Diagnosis not present

## 2018-06-24 DIAGNOSIS — Z7982 Long term (current) use of aspirin: Secondary | ICD-10-CM | POA: Diagnosis not present

## 2018-06-24 DIAGNOSIS — E785 Hyperlipidemia, unspecified: Secondary | ICD-10-CM | POA: Diagnosis not present

## 2018-06-24 DIAGNOSIS — J449 Chronic obstructive pulmonary disease, unspecified: Secondary | ICD-10-CM | POA: Diagnosis not present

## 2018-06-24 DIAGNOSIS — R1310 Dysphagia, unspecified: Secondary | ICD-10-CM | POA: Diagnosis not present

## 2018-06-24 NOTE — Addendum Note (Signed)
Addended byDamita Dunnings D on: 06/24/2018 01:11 PM   Modules accepted: Orders

## 2018-06-24 NOTE — Telephone Encounter (Signed)
Call patient: Recommend to increase amlodipine 5 mg to two tablets daily.  Adjust medication list Continue monitoring BPs. Call with readings in 1 week. Okay to take Tylenol for the headache, if it is severe, needs to be evaluated.  BP Readings from Last 3 Encounters:  06/04/18 (!) 144/78  06/01/18 (!) 150/86  05/01/18 130/68

## 2018-06-24 NOTE — Telephone Encounter (Signed)
Spoke w/ Pt- informed of recommendations. Pt stated her BP was "perfectly normal" when home health nurse came out this morning. Recommended she still increase amlodipine to 2 tablets daily. Pt verbalized understanding. Med list updated.

## 2018-06-25 DIAGNOSIS — E785 Hyperlipidemia, unspecified: Secondary | ICD-10-CM | POA: Diagnosis not present

## 2018-06-25 DIAGNOSIS — G4700 Insomnia, unspecified: Secondary | ICD-10-CM | POA: Diagnosis not present

## 2018-06-25 DIAGNOSIS — M519 Unspecified thoracic, thoracolumbar and lumbosacral intervertebral disc disorder: Secondary | ICD-10-CM | POA: Diagnosis not present

## 2018-06-25 DIAGNOSIS — J449 Chronic obstructive pulmonary disease, unspecified: Secondary | ICD-10-CM | POA: Diagnosis not present

## 2018-06-25 DIAGNOSIS — I130 Hypertensive heart and chronic kidney disease with heart failure and stage 1 through stage 4 chronic kidney disease, or unspecified chronic kidney disease: Secondary | ICD-10-CM | POA: Diagnosis not present

## 2018-06-25 DIAGNOSIS — G4733 Obstructive sleep apnea (adult) (pediatric): Secondary | ICD-10-CM | POA: Diagnosis not present

## 2018-06-25 DIAGNOSIS — H409 Unspecified glaucoma: Secondary | ICD-10-CM | POA: Diagnosis not present

## 2018-06-25 DIAGNOSIS — R1310 Dysphagia, unspecified: Secondary | ICD-10-CM | POA: Diagnosis not present

## 2018-06-25 DIAGNOSIS — Z9981 Dependence on supplemental oxygen: Secondary | ICD-10-CM | POA: Diagnosis not present

## 2018-06-25 DIAGNOSIS — K219 Gastro-esophageal reflux disease without esophagitis: Secondary | ICD-10-CM | POA: Diagnosis not present

## 2018-06-25 DIAGNOSIS — D631 Anemia in chronic kidney disease: Secondary | ICD-10-CM | POA: Diagnosis not present

## 2018-06-25 DIAGNOSIS — K5730 Diverticulosis of large intestine without perforation or abscess without bleeding: Secondary | ICD-10-CM | POA: Diagnosis not present

## 2018-06-25 DIAGNOSIS — E1122 Type 2 diabetes mellitus with diabetic chronic kidney disease: Secondary | ICD-10-CM | POA: Diagnosis not present

## 2018-06-25 DIAGNOSIS — E079 Disorder of thyroid, unspecified: Secondary | ICD-10-CM | POA: Diagnosis not present

## 2018-06-25 DIAGNOSIS — M4316 Spondylolisthesis, lumbar region: Secondary | ICD-10-CM | POA: Diagnosis not present

## 2018-06-25 DIAGNOSIS — Z7951 Long term (current) use of inhaled steroids: Secondary | ICD-10-CM | POA: Diagnosis not present

## 2018-06-25 DIAGNOSIS — K228 Other specified diseases of esophagus: Secondary | ICD-10-CM | POA: Diagnosis not present

## 2018-06-25 DIAGNOSIS — N183 Chronic kidney disease, stage 3 (moderate): Secondary | ICD-10-CM | POA: Diagnosis not present

## 2018-06-25 DIAGNOSIS — J9610 Chronic respiratory failure, unspecified whether with hypoxia or hypercapnia: Secondary | ICD-10-CM | POA: Diagnosis not present

## 2018-06-25 DIAGNOSIS — Z7982 Long term (current) use of aspirin: Secondary | ICD-10-CM | POA: Diagnosis not present

## 2018-06-25 DIAGNOSIS — I509 Heart failure, unspecified: Secondary | ICD-10-CM | POA: Diagnosis not present

## 2018-06-25 NOTE — Telephone Encounter (Signed)
Thank you  for update

## 2018-06-25 NOTE — Telephone Encounter (Signed)
Called and spoke with patient. She informed me that she was in the process of moving and would call us back to schedule after she got moved. I offered her an appointment at the end of March to give her time to get moved, but she declined and states that she will just call us back.

## 2018-06-25 NOTE — Telephone Encounter (Signed)
Thank you :)

## 2018-06-28 DIAGNOSIS — M4316 Spondylolisthesis, lumbar region: Secondary | ICD-10-CM | POA: Diagnosis not present

## 2018-06-28 DIAGNOSIS — R1310 Dysphagia, unspecified: Secondary | ICD-10-CM | POA: Diagnosis not present

## 2018-06-28 DIAGNOSIS — K219 Gastro-esophageal reflux disease without esophagitis: Secondary | ICD-10-CM | POA: Diagnosis not present

## 2018-06-28 DIAGNOSIS — Z7951 Long term (current) use of inhaled steroids: Secondary | ICD-10-CM | POA: Diagnosis not present

## 2018-06-28 DIAGNOSIS — N183 Chronic kidney disease, stage 3 (moderate): Secondary | ICD-10-CM | POA: Diagnosis not present

## 2018-06-28 DIAGNOSIS — J9610 Chronic respiratory failure, unspecified whether with hypoxia or hypercapnia: Secondary | ICD-10-CM | POA: Diagnosis not present

## 2018-06-28 DIAGNOSIS — K228 Other specified diseases of esophagus: Secondary | ICD-10-CM | POA: Diagnosis not present

## 2018-06-28 DIAGNOSIS — M519 Unspecified thoracic, thoracolumbar and lumbosacral intervertebral disc disorder: Secondary | ICD-10-CM | POA: Diagnosis not present

## 2018-06-28 DIAGNOSIS — I509 Heart failure, unspecified: Secondary | ICD-10-CM | POA: Diagnosis not present

## 2018-06-28 DIAGNOSIS — E079 Disorder of thyroid, unspecified: Secondary | ICD-10-CM | POA: Diagnosis not present

## 2018-06-28 DIAGNOSIS — Z9981 Dependence on supplemental oxygen: Secondary | ICD-10-CM | POA: Diagnosis not present

## 2018-06-28 DIAGNOSIS — I130 Hypertensive heart and chronic kidney disease with heart failure and stage 1 through stage 4 chronic kidney disease, or unspecified chronic kidney disease: Secondary | ICD-10-CM | POA: Diagnosis not present

## 2018-06-28 DIAGNOSIS — Z7982 Long term (current) use of aspirin: Secondary | ICD-10-CM | POA: Diagnosis not present

## 2018-06-28 DIAGNOSIS — G4733 Obstructive sleep apnea (adult) (pediatric): Secondary | ICD-10-CM | POA: Diagnosis not present

## 2018-06-28 DIAGNOSIS — H409 Unspecified glaucoma: Secondary | ICD-10-CM | POA: Diagnosis not present

## 2018-06-28 DIAGNOSIS — D631 Anemia in chronic kidney disease: Secondary | ICD-10-CM | POA: Diagnosis not present

## 2018-06-28 DIAGNOSIS — E785 Hyperlipidemia, unspecified: Secondary | ICD-10-CM | POA: Diagnosis not present

## 2018-06-28 DIAGNOSIS — K5730 Diverticulosis of large intestine without perforation or abscess without bleeding: Secondary | ICD-10-CM | POA: Diagnosis not present

## 2018-06-28 DIAGNOSIS — J449 Chronic obstructive pulmonary disease, unspecified: Secondary | ICD-10-CM | POA: Diagnosis not present

## 2018-06-28 DIAGNOSIS — G4700 Insomnia, unspecified: Secondary | ICD-10-CM | POA: Diagnosis not present

## 2018-06-28 DIAGNOSIS — E1122 Type 2 diabetes mellitus with diabetic chronic kidney disease: Secondary | ICD-10-CM | POA: Diagnosis not present

## 2018-06-29 DIAGNOSIS — K5730 Diverticulosis of large intestine without perforation or abscess without bleeding: Secondary | ICD-10-CM | POA: Diagnosis not present

## 2018-06-29 DIAGNOSIS — G4700 Insomnia, unspecified: Secondary | ICD-10-CM | POA: Diagnosis not present

## 2018-06-29 DIAGNOSIS — I130 Hypertensive heart and chronic kidney disease with heart failure and stage 1 through stage 4 chronic kidney disease, or unspecified chronic kidney disease: Secondary | ICD-10-CM | POA: Diagnosis not present

## 2018-06-29 DIAGNOSIS — N183 Chronic kidney disease, stage 3 (moderate): Secondary | ICD-10-CM | POA: Diagnosis not present

## 2018-06-29 DIAGNOSIS — E079 Disorder of thyroid, unspecified: Secondary | ICD-10-CM | POA: Diagnosis not present

## 2018-06-29 DIAGNOSIS — J449 Chronic obstructive pulmonary disease, unspecified: Secondary | ICD-10-CM | POA: Diagnosis not present

## 2018-06-29 DIAGNOSIS — R1310 Dysphagia, unspecified: Secondary | ICD-10-CM | POA: Diagnosis not present

## 2018-06-29 DIAGNOSIS — M519 Unspecified thoracic, thoracolumbar and lumbosacral intervertebral disc disorder: Secondary | ICD-10-CM | POA: Diagnosis not present

## 2018-06-29 DIAGNOSIS — Z7951 Long term (current) use of inhaled steroids: Secondary | ICD-10-CM | POA: Diagnosis not present

## 2018-06-29 DIAGNOSIS — G4733 Obstructive sleep apnea (adult) (pediatric): Secondary | ICD-10-CM | POA: Diagnosis not present

## 2018-06-29 DIAGNOSIS — K219 Gastro-esophageal reflux disease without esophagitis: Secondary | ICD-10-CM | POA: Diagnosis not present

## 2018-06-29 DIAGNOSIS — Z7982 Long term (current) use of aspirin: Secondary | ICD-10-CM | POA: Diagnosis not present

## 2018-06-29 DIAGNOSIS — J9610 Chronic respiratory failure, unspecified whether with hypoxia or hypercapnia: Secondary | ICD-10-CM | POA: Diagnosis not present

## 2018-06-29 DIAGNOSIS — M4316 Spondylolisthesis, lumbar region: Secondary | ICD-10-CM | POA: Diagnosis not present

## 2018-06-29 DIAGNOSIS — Z9981 Dependence on supplemental oxygen: Secondary | ICD-10-CM | POA: Diagnosis not present

## 2018-06-29 DIAGNOSIS — D631 Anemia in chronic kidney disease: Secondary | ICD-10-CM | POA: Diagnosis not present

## 2018-06-29 DIAGNOSIS — E1122 Type 2 diabetes mellitus with diabetic chronic kidney disease: Secondary | ICD-10-CM | POA: Diagnosis not present

## 2018-06-29 DIAGNOSIS — I509 Heart failure, unspecified: Secondary | ICD-10-CM | POA: Diagnosis not present

## 2018-06-29 DIAGNOSIS — K228 Other specified diseases of esophagus: Secondary | ICD-10-CM | POA: Diagnosis not present

## 2018-06-29 DIAGNOSIS — E785 Hyperlipidemia, unspecified: Secondary | ICD-10-CM | POA: Diagnosis not present

## 2018-06-29 DIAGNOSIS — H409 Unspecified glaucoma: Secondary | ICD-10-CM | POA: Diagnosis not present

## 2018-06-30 DIAGNOSIS — N183 Chronic kidney disease, stage 3 (moderate): Secondary | ICD-10-CM | POA: Diagnosis not present

## 2018-06-30 DIAGNOSIS — E1122 Type 2 diabetes mellitus with diabetic chronic kidney disease: Secondary | ICD-10-CM | POA: Diagnosis not present

## 2018-06-30 DIAGNOSIS — R1310 Dysphagia, unspecified: Secondary | ICD-10-CM | POA: Diagnosis not present

## 2018-06-30 DIAGNOSIS — I509 Heart failure, unspecified: Secondary | ICD-10-CM | POA: Diagnosis not present

## 2018-06-30 DIAGNOSIS — Z7951 Long term (current) use of inhaled steroids: Secondary | ICD-10-CM | POA: Diagnosis not present

## 2018-06-30 DIAGNOSIS — E785 Hyperlipidemia, unspecified: Secondary | ICD-10-CM | POA: Diagnosis not present

## 2018-06-30 DIAGNOSIS — G4700 Insomnia, unspecified: Secondary | ICD-10-CM | POA: Diagnosis not present

## 2018-06-30 DIAGNOSIS — J9610 Chronic respiratory failure, unspecified whether with hypoxia or hypercapnia: Secondary | ICD-10-CM | POA: Diagnosis not present

## 2018-06-30 DIAGNOSIS — K228 Other specified diseases of esophagus: Secondary | ICD-10-CM | POA: Diagnosis not present

## 2018-06-30 DIAGNOSIS — G4733 Obstructive sleep apnea (adult) (pediatric): Secondary | ICD-10-CM | POA: Diagnosis not present

## 2018-06-30 DIAGNOSIS — D631 Anemia in chronic kidney disease: Secondary | ICD-10-CM | POA: Diagnosis not present

## 2018-06-30 DIAGNOSIS — Z9981 Dependence on supplemental oxygen: Secondary | ICD-10-CM | POA: Diagnosis not present

## 2018-06-30 DIAGNOSIS — Z7982 Long term (current) use of aspirin: Secondary | ICD-10-CM | POA: Diagnosis not present

## 2018-06-30 DIAGNOSIS — J449 Chronic obstructive pulmonary disease, unspecified: Secondary | ICD-10-CM | POA: Diagnosis not present

## 2018-06-30 DIAGNOSIS — I130 Hypertensive heart and chronic kidney disease with heart failure and stage 1 through stage 4 chronic kidney disease, or unspecified chronic kidney disease: Secondary | ICD-10-CM | POA: Diagnosis not present

## 2018-06-30 DIAGNOSIS — E079 Disorder of thyroid, unspecified: Secondary | ICD-10-CM | POA: Diagnosis not present

## 2018-06-30 DIAGNOSIS — M4316 Spondylolisthesis, lumbar region: Secondary | ICD-10-CM | POA: Diagnosis not present

## 2018-06-30 DIAGNOSIS — K5730 Diverticulosis of large intestine without perforation or abscess without bleeding: Secondary | ICD-10-CM | POA: Diagnosis not present

## 2018-06-30 DIAGNOSIS — H409 Unspecified glaucoma: Secondary | ICD-10-CM | POA: Diagnosis not present

## 2018-06-30 DIAGNOSIS — K219 Gastro-esophageal reflux disease without esophagitis: Secondary | ICD-10-CM | POA: Diagnosis not present

## 2018-06-30 DIAGNOSIS — M519 Unspecified thoracic, thoracolumbar and lumbosacral intervertebral disc disorder: Secondary | ICD-10-CM | POA: Diagnosis not present

## 2018-07-01 DIAGNOSIS — D631 Anemia in chronic kidney disease: Secondary | ICD-10-CM | POA: Diagnosis not present

## 2018-07-01 DIAGNOSIS — J9610 Chronic respiratory failure, unspecified whether with hypoxia or hypercapnia: Secondary | ICD-10-CM | POA: Diagnosis not present

## 2018-07-01 DIAGNOSIS — G4733 Obstructive sleep apnea (adult) (pediatric): Secondary | ICD-10-CM | POA: Diagnosis not present

## 2018-07-01 DIAGNOSIS — E079 Disorder of thyroid, unspecified: Secondary | ICD-10-CM | POA: Diagnosis not present

## 2018-07-01 DIAGNOSIS — I130 Hypertensive heart and chronic kidney disease with heart failure and stage 1 through stage 4 chronic kidney disease, or unspecified chronic kidney disease: Secondary | ICD-10-CM | POA: Diagnosis not present

## 2018-07-01 DIAGNOSIS — Z7951 Long term (current) use of inhaled steroids: Secondary | ICD-10-CM | POA: Diagnosis not present

## 2018-07-01 DIAGNOSIS — E785 Hyperlipidemia, unspecified: Secondary | ICD-10-CM | POA: Diagnosis not present

## 2018-07-01 DIAGNOSIS — Z7982 Long term (current) use of aspirin: Secondary | ICD-10-CM | POA: Diagnosis not present

## 2018-07-01 DIAGNOSIS — H409 Unspecified glaucoma: Secondary | ICD-10-CM | POA: Diagnosis not present

## 2018-07-01 DIAGNOSIS — J449 Chronic obstructive pulmonary disease, unspecified: Secondary | ICD-10-CM | POA: Diagnosis not present

## 2018-07-01 DIAGNOSIS — G4700 Insomnia, unspecified: Secondary | ICD-10-CM | POA: Diagnosis not present

## 2018-07-01 DIAGNOSIS — M4316 Spondylolisthesis, lumbar region: Secondary | ICD-10-CM | POA: Diagnosis not present

## 2018-07-01 DIAGNOSIS — K5730 Diverticulosis of large intestine without perforation or abscess without bleeding: Secondary | ICD-10-CM | POA: Diagnosis not present

## 2018-07-01 DIAGNOSIS — M519 Unspecified thoracic, thoracolumbar and lumbosacral intervertebral disc disorder: Secondary | ICD-10-CM | POA: Diagnosis not present

## 2018-07-01 DIAGNOSIS — N183 Chronic kidney disease, stage 3 (moderate): Secondary | ICD-10-CM | POA: Diagnosis not present

## 2018-07-01 DIAGNOSIS — K228 Other specified diseases of esophagus: Secondary | ICD-10-CM | POA: Diagnosis not present

## 2018-07-01 DIAGNOSIS — E1122 Type 2 diabetes mellitus with diabetic chronic kidney disease: Secondary | ICD-10-CM | POA: Diagnosis not present

## 2018-07-01 DIAGNOSIS — Z9981 Dependence on supplemental oxygen: Secondary | ICD-10-CM | POA: Diagnosis not present

## 2018-07-01 DIAGNOSIS — K219 Gastro-esophageal reflux disease without esophagitis: Secondary | ICD-10-CM | POA: Diagnosis not present

## 2018-07-01 DIAGNOSIS — R1310 Dysphagia, unspecified: Secondary | ICD-10-CM | POA: Diagnosis not present

## 2018-07-01 DIAGNOSIS — I509 Heart failure, unspecified: Secondary | ICD-10-CM | POA: Diagnosis not present

## 2018-07-06 ENCOUNTER — Other Ambulatory Visit: Payer: Self-pay | Admitting: Internal Medicine

## 2018-07-06 ENCOUNTER — Other Ambulatory Visit: Payer: Self-pay

## 2018-07-06 DIAGNOSIS — K228 Other specified diseases of esophagus: Secondary | ICD-10-CM | POA: Diagnosis not present

## 2018-07-06 DIAGNOSIS — D631 Anemia in chronic kidney disease: Secondary | ICD-10-CM | POA: Diagnosis not present

## 2018-07-06 DIAGNOSIS — Z7951 Long term (current) use of inhaled steroids: Secondary | ICD-10-CM | POA: Diagnosis not present

## 2018-07-06 DIAGNOSIS — I130 Hypertensive heart and chronic kidney disease with heart failure and stage 1 through stage 4 chronic kidney disease, or unspecified chronic kidney disease: Secondary | ICD-10-CM | POA: Diagnosis not present

## 2018-07-06 DIAGNOSIS — E1122 Type 2 diabetes mellitus with diabetic chronic kidney disease: Secondary | ICD-10-CM | POA: Diagnosis not present

## 2018-07-06 DIAGNOSIS — E079 Disorder of thyroid, unspecified: Secondary | ICD-10-CM | POA: Diagnosis not present

## 2018-07-06 DIAGNOSIS — H409 Unspecified glaucoma: Secondary | ICD-10-CM | POA: Diagnosis not present

## 2018-07-06 DIAGNOSIS — K219 Gastro-esophageal reflux disease without esophagitis: Secondary | ICD-10-CM | POA: Diagnosis not present

## 2018-07-06 DIAGNOSIS — G4733 Obstructive sleep apnea (adult) (pediatric): Secondary | ICD-10-CM | POA: Diagnosis not present

## 2018-07-06 DIAGNOSIS — Z9981 Dependence on supplemental oxygen: Secondary | ICD-10-CM | POA: Diagnosis not present

## 2018-07-06 DIAGNOSIS — J449 Chronic obstructive pulmonary disease, unspecified: Secondary | ICD-10-CM | POA: Diagnosis not present

## 2018-07-06 DIAGNOSIS — M4316 Spondylolisthesis, lumbar region: Secondary | ICD-10-CM | POA: Diagnosis not present

## 2018-07-06 DIAGNOSIS — N183 Chronic kidney disease, stage 3 (moderate): Secondary | ICD-10-CM | POA: Diagnosis not present

## 2018-07-06 DIAGNOSIS — R1310 Dysphagia, unspecified: Secondary | ICD-10-CM | POA: Diagnosis not present

## 2018-07-06 DIAGNOSIS — Z7982 Long term (current) use of aspirin: Secondary | ICD-10-CM | POA: Diagnosis not present

## 2018-07-06 DIAGNOSIS — G4700 Insomnia, unspecified: Secondary | ICD-10-CM | POA: Diagnosis not present

## 2018-07-06 DIAGNOSIS — E785 Hyperlipidemia, unspecified: Secondary | ICD-10-CM | POA: Diagnosis not present

## 2018-07-06 DIAGNOSIS — J9610 Chronic respiratory failure, unspecified whether with hypoxia or hypercapnia: Secondary | ICD-10-CM | POA: Diagnosis not present

## 2018-07-06 DIAGNOSIS — K5730 Diverticulosis of large intestine without perforation or abscess without bleeding: Secondary | ICD-10-CM | POA: Diagnosis not present

## 2018-07-06 DIAGNOSIS — M519 Unspecified thoracic, thoracolumbar and lumbosacral intervertebral disc disorder: Secondary | ICD-10-CM | POA: Diagnosis not present

## 2018-07-06 DIAGNOSIS — I509 Heart failure, unspecified: Secondary | ICD-10-CM | POA: Diagnosis not present

## 2018-07-06 NOTE — Patient Outreach (Signed)
[facility]) Care Management  07/06/2018  ALEXIANNA NACHREINER [birth year] [phone]    Unsuccessful outreach to the patient for assessment. No answer.  Called number three times message that the call did not go through call later.  Plan: RN Health Coach will send letter. [place] will make outreach attempt the patient within month of March.  Lazaro Arms RN, BSN, [place] Direct Dial:  [phone]  Fax: [phone]

## 2018-07-09 ENCOUNTER — Telehealth: Payer: Self-pay | Admitting: Endocrinology

## 2018-07-09 ENCOUNTER — Other Ambulatory Visit: Payer: Self-pay

## 2018-07-09 DIAGNOSIS — K228 Other specified diseases of esophagus: Secondary | ICD-10-CM | POA: Diagnosis not present

## 2018-07-09 DIAGNOSIS — Z7982 Long term (current) use of aspirin: Secondary | ICD-10-CM | POA: Diagnosis not present

## 2018-07-09 DIAGNOSIS — I509 Heart failure, unspecified: Secondary | ICD-10-CM | POA: Diagnosis not present

## 2018-07-09 DIAGNOSIS — G4733 Obstructive sleep apnea (adult) (pediatric): Secondary | ICD-10-CM | POA: Diagnosis not present

## 2018-07-09 DIAGNOSIS — Z7951 Long term (current) use of inhaled steroids: Secondary | ICD-10-CM | POA: Diagnosis not present

## 2018-07-09 DIAGNOSIS — M4316 Spondylolisthesis, lumbar region: Secondary | ICD-10-CM | POA: Diagnosis not present

## 2018-07-09 DIAGNOSIS — E785 Hyperlipidemia, unspecified: Secondary | ICD-10-CM | POA: Diagnosis not present

## 2018-07-09 DIAGNOSIS — N181 Chronic kidney disease, stage 1: Principal | ICD-10-CM

## 2018-07-09 DIAGNOSIS — K5730 Diverticulosis of large intestine without perforation or abscess without bleeding: Secondary | ICD-10-CM | POA: Diagnosis not present

## 2018-07-09 DIAGNOSIS — M519 Unspecified thoracic, thoracolumbar and lumbosacral intervertebral disc disorder: Secondary | ICD-10-CM | POA: Diagnosis not present

## 2018-07-09 DIAGNOSIS — J449 Chronic obstructive pulmonary disease, unspecified: Secondary | ICD-10-CM | POA: Diagnosis not present

## 2018-07-09 DIAGNOSIS — D631 Anemia in chronic kidney disease: Secondary | ICD-10-CM | POA: Diagnosis not present

## 2018-07-09 DIAGNOSIS — R1310 Dysphagia, unspecified: Secondary | ICD-10-CM | POA: Diagnosis not present

## 2018-07-09 DIAGNOSIS — J9610 Chronic respiratory failure, unspecified whether with hypoxia or hypercapnia: Secondary | ICD-10-CM | POA: Diagnosis not present

## 2018-07-09 DIAGNOSIS — Z9981 Dependence on supplemental oxygen: Secondary | ICD-10-CM | POA: Diagnosis not present

## 2018-07-09 DIAGNOSIS — N183 Chronic kidney disease, stage 3 (moderate): Secondary | ICD-10-CM | POA: Diagnosis not present

## 2018-07-09 DIAGNOSIS — E079 Disorder of thyroid, unspecified: Secondary | ICD-10-CM | POA: Diagnosis not present

## 2018-07-09 DIAGNOSIS — K219 Gastro-esophageal reflux disease without esophagitis: Secondary | ICD-10-CM | POA: Diagnosis not present

## 2018-07-09 DIAGNOSIS — E1122 Type 2 diabetes mellitus with diabetic chronic kidney disease: Secondary | ICD-10-CM

## 2018-07-09 DIAGNOSIS — H409 Unspecified glaucoma: Secondary | ICD-10-CM | POA: Diagnosis not present

## 2018-07-09 DIAGNOSIS — I130 Hypertensive heart and chronic kidney disease with heart failure and stage 1 through stage 4 chronic kidney disease, or unspecified chronic kidney disease: Secondary | ICD-10-CM | POA: Diagnosis not present

## 2018-07-09 DIAGNOSIS — G4700 Insomnia, unspecified: Secondary | ICD-10-CM | POA: Diagnosis not present

## 2018-07-09 MED ORDER — ACCU-CHEK SOFTCLIX LANCETS MISC: 0 refills | Status: DC

## 2018-07-09 NOTE — Telephone Encounter (Signed)
ACCU-CHEK SOFTCLIX LANCETS lancets 100 each 0 07/09/2018    Sig: USE TO CHECK BLOOD SUGAR TWICE A DAY E11.9   Sent to pharmacy as: [name] LANCETS lancets   E-Prescribing Status: Sent to pharmacy (07/09/2018 8:36 AM EST)

## 2018-07-09 NOTE — Telephone Encounter (Signed)
Per [facility], "[facility] is requesting a refill for patient.. She is needing a refill on Accu Check soft click lancets. She uses them twice a day and she needs a 90 day supply. She is needing at least 180 of them. Spoke with Christy Sartorius at the pharmacy [phone].

## 2018-07-11 DIAGNOSIS — R0902 Hypoxemia: Secondary | ICD-10-CM | POA: Diagnosis not present

## 2018-07-11 DIAGNOSIS — G4733 Obstructive sleep apnea (adult) (pediatric): Secondary | ICD-10-CM | POA: Diagnosis not present

## 2018-07-19 ENCOUNTER — Ambulatory Visit (INDEPENDENT_AMBULATORY_CARE_PROVIDER_SITE_OTHER): Payer: Medicare Other | Admitting: Physician Assistant

## 2018-07-19 ENCOUNTER — Encounter: Payer: Self-pay | Admitting: Physician Assistant

## 2018-07-19 VITALS — BP 162/88 | HR 72 | Ht 69.0 in | Wt 163.8 lb

## 2018-07-19 DIAGNOSIS — I300 Acute nonspecific idiopathic pericarditis: Secondary | ICD-10-CM

## 2018-07-19 DIAGNOSIS — E876 Hypokalemia: Secondary | ICD-10-CM | POA: Diagnosis not present

## 2018-07-19 DIAGNOSIS — I10 Essential (primary) hypertension: Secondary | ICD-10-CM

## 2018-07-19 DIAGNOSIS — R079 Chest pain, unspecified: Secondary | ICD-10-CM

## 2018-07-19 DIAGNOSIS — J9611 Chronic respiratory failure with hypoxia: Secondary | ICD-10-CM | POA: Diagnosis not present

## 2018-07-19 DIAGNOSIS — I2510 Atherosclerotic heart disease of native coronary artery without angina pectoris: Secondary | ICD-10-CM | POA: Diagnosis not present

## 2018-07-19 DIAGNOSIS — I313 Pericardial effusion (noninflammatory): Secondary | ICD-10-CM

## 2018-07-19 DIAGNOSIS — G8929 Other chronic pain: Secondary | ICD-10-CM | POA: Diagnosis not present

## 2018-07-19 DIAGNOSIS — I3139 Other pericardial effusion (noninflammatory): Secondary | ICD-10-CM

## 2018-07-19 DIAGNOSIS — M6281 Muscle weakness (generalized): Secondary | ICD-10-CM | POA: Diagnosis not present

## 2018-07-19 DIAGNOSIS — I509 Heart failure, unspecified: Secondary | ICD-10-CM | POA: Diagnosis not present

## 2018-07-19 MED ORDER — AMLODIPINE BESYLATE 10 MG PO TABS: 10.0000 mg | ORAL_TABLET | Freq: Every day | ORAL | 11 refills | Status: DC

## 2018-07-19 NOTE — Patient Instructions (Addendum)
Medication Instructions:  1) INCREASE AMLODIPINE to 10 mg daily. This has been called in to [employer].  Labwork: None  Testing/Procedures: None  Follow-Up: You have an appointment with Dr. Radford Pax on 10/22/2018 at 2:40PM.

## 2018-07-19 NOTE — Progress Notes (Signed)
Cardiology Office Note    Date:  07/19/2018  ID:  [name], DOB Jul 30, 1950, MRN [id number] PCP:  Colon Branch, MD  Cardiologist:  Fransico Him, MD   Chief Complaint: f/u pericarditis  History of Present Illness:  [name] is a 68 y.o. 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  She had a previous Myoview obtained on 09/10/2016 that showed EF 45% with normal perfusion. Concurrent echocardiogram 09/09/2016 showed EF 55 to 60%, no wall motion abnormality. She was admitted on 10/19/2017 with acute onset of shortness of breath.CTA of the chest revealed multifocal groundglass opacity with differentials include pulmonary edema versus infectious versus inflammatory etiology.There was also small to moderate pericardial effusion measuring up to 2 cm on the CTA. Her TSH was high, Synthroid was increased. She was also severely hypoglycemic and was given D50.Cardiology service was consulted for pericardial effusion. Echocardiogram obtained on 10/20/2017 showed EF 55 to 60%, mild LVH, mild pericardial effusion.She eventually underwent cardiac catheterization 10/22/2017 showed normal coronaries, EF 55 to 65%. Due to low LVEDP on cath, Lasix was stopped. Due to concern of her symptoms related to pericarditis, patient was restarted on ibuprofen and colchicine.It was also recommended for her to follow-up with rheumatology for further evaluation but she did not attend this appointment. Apparently there have been issues getting someone to take her due to prior  noncompliance.   She was readmitted on 11/12/2017 with shortness of breath and recurrent chest discomfort. She had positive ANA, RNP, anti-Smith and, chromatin. 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 Pulmonology service recommended discontinuation of diltiazem as positive causative agent and a repeat chest CT in 8 weeks. Unfortunately, she was readmitted on 11/17/17 with generalized weakness and tachycardia.He was found to have a urinary tract infection and diarrhea. C. difficile was negative. She was ruled out of PE through VQ scan. On 11/19/17, she had unresponsive episode. Code stroke was activated and the patient was evaluated by neurology service.EEG was unremarkable. MRI of the brain was negative.She did develop acute renal failure felt to be prerenal.Renal ultrasound was normal. Her Micardis was stopped.She was eventually discharged home. She has also been noted to have significant weight loss over the past year of unclear etiology.  At [place] 12/2017 she continued to have chest pain worse with lying down or taking a deep breath. This was felt possibly MSK. Repeat limited echo showed a trivial pericardial effusion posterior to the heart, improved from prior. When she saw Dr. Radford Pax 02/2018 she was still having chest discomfort but felt maybe GERD in etiology. CRP and ESR remained persistently elevated.Cardiac MRI 03/25/19 showed normal EF 56%, normal RV, mildly dilated PA, mild almost circumferential pericardial effusion, pericardium has normal thickness but there is late gadolinium enhancement consistent with inflammation, findings felt consistent with acute pericarditis. She was asked to resume ibuprofen x 2 weeks and colchicine. When she saw Cecilie Kicks NP back in follow-up 04/2018 there were a mutitude of issues including diarrhea with colchicine, noncompliance with treatment for UTI, noncompliance with potassium supplement, social issues with transportation and continued  weight loss. Last labs 06/04/18 showed K 3.4, Cr 0.88, WBC 3.0, Hgb 10.1,  plt 282, TSH 7.6 for which meds were adjusted. She had a CT done in the ER 05/02/19 which showed improved diffuse ground-glass opacities and lower lobe bronchiectasis, patulous esophagus and interstitial lung disease which can be associated with scleroderma.  She returns for follow-up today. Her major complaint is that she wants to be done with the visit because she needs to eat. She seems to have poor insight into her medical history. She has continued unintentional weight loss. She seems unconcerned with needing to see rheumatology. Her blood pressure is elevated. She did not think she was taking the amlodipine '5mg'$  daily but asked [place] to call her pharmacy - they deliver pill-packs with her prescription and confirm they have packaged '5mg'$  tablets in her daily meds. However, it appears there was a phone note recently where primary care tried to increase to 2 tablets daily. It does not appear she made this change. She stopped the ibuprofen because it messes with her stomach. She still has chest discomfort at night when she lies down or with meals. It is not worse with exertion or inspiration.  Past Medical History:  Diagnosis Date  . Anterolisthesis    Grade 1, L4-5  . Bronchospasm 05/28/2012  . COPD (chronic obstructive pulmonary disease) ([place])   . DEGENERATIVE JOINT DISEASE 10/06/2006  . DEPRESSION 09/26/2008  . DIABETES MELLITUS 10/06/2006  . Diverticulosis    [id number]  . GERD (gastroesophageal reflux disease) 07/25/2013  . HYPERLIPIDEMIA 01/11/2009  . HYPERTENSION 10/06/2006  . Hypokalemia   . Hypomagnesemia   . INSOMNIA 09/26/2008  . Internal hemorrhoids   . OBSTRUCTIVE SLEEP APNEA 06/23/2008   Severe OSA per sleep study 2010, Rx a CPAP  . Pain in joint, multiple sites 11/10/2006  . Pericardial effusion   . Pericarditis   . UNSPECIFIED ANEMIA 12/10/2009  . UTI (urinary tract infection) 11/2017    Past Surgical History:    Procedure Laterality Date  . BIOPSY  12/28/2017   Procedure: BIOPSY;  Surgeon: Irving Copas., MD;  Location: [facility];  Service: Gastroenterology;;  . COLONOSCOPY  08/01/2011   Procedure: COLONOSCOPY;  Surgeon: Inda Castle, MD;  Location: [facility];  Service: Endoscopy;  Laterality: N/A;  . ESOPHAGOGASTRODUODENOSCOPY (EGD) WITH PROPOFOL N/A 12/28/2017   Procedure: ESOPHAGOGASTRODUODENOSCOPY (EGD) WITH PROPOFOL;  Surgeon: Rush Landmark Telford Nab., MD;  Location: [facility];  Service: Gastroenterology;  Laterality: N/A;  . LEFT HEART CATH AND CORONARY ANGIOGRAPHY N/A 10/22/2017   Procedure: LEFT HEART CATH AND CORONARY ANGIOGRAPHY;  Surgeon: Jettie Booze, MD;  Location: [facility];  Service: Cardiovascular;  Laterality: N/A;  . 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 Right knee replacement  2005    Current Medications: Current Meds  Medication Sig  . ACCU-CHEK SOFTCLIX LANCETS lancets USE TO CHECK BLOOD SUGAR TWICE A DAY E11.9  . albuterol (PROVENTIL HFA;VENTOLIN HFA) 108 (90 Base) MCG/ACT inhaler Inhale 2 puffs into the lungs every 4 (four) hours as needed for wheezing.  Marland Kitchen amLODipine (NORVASC) 5 MG tablet Take 1 tablet (5 mg total) by mouth daily.  Marland Kitchen aspirin EC 81 MG tablet Take 1 tablet (81 mg total) by mouth daily.  . Blood Glucose Monitoring Suppl (ACCU-CHEK AVIVA) device Use as instructed to check blood sugar twice daily dx E11.9  . colchicine 0.6 MG tablet Take 1 tablet (0.6 mg total) by mouth daily.  Marland Kitchen escitalopram (LEXAPRO) 20 MG tablet Take 1 tablet (20 mg total) by mouth daily.  . Fluticasone-Salmeterol (ADVAIR) 250-50 MCG/DOSE AEPB Inhale 2  puffs into the lungs daily.   Marland Kitchen glucose blood (ACCU-CHEK AVIVA PLUS) test strip TEST BLOOD SUGAR TWICE DAILY AND LANCETS TWICE DAILY E11.9  . latanoprost (XALATAN) 0.005 % ophthalmic solution Place 1 drop into both eyes at bedtime.  Marland Kitchen levothyroxine (SYNTHROID, LEVOTHROID) 75 MCG tablet Take 1 tablet (75 mcg total) by  mouth daily before breakfast.  . metoprolol tartrate (LOPRESSOR) 25 MG tablet TAKE 2 TABLETS BY MOUTH TWICE DAILY  . OVER THE COUNTER MEDICATION CPAP: At bedtime  . OXYGEN Inhale 2 L into the lungs continuous.   . pantoprazole (PROTONIX) 40 MG tablet Take 1 tablet (40 mg total) by mouth daily.  . potassium chloride SA (K-DUR,KLOR-CON) 20 MEQ tablet Take 1 tablet (20 mEq total) by mouth 2 (two) times daily.  . rosuvastatin (CRESTOR) 40 MG tablet Take 1 tablet (40 mg total) by mouth every evening.  . [DISCONTINUED] latanoprost (XALATAN) 0.005 % ophthalmic solution USE 1 DROP IN BOTH EYES AT BEDTIME (Patient taking differently: Place 1 drop into both eyes at bedtime. )      Allergies:   Patient has no known allergies.   Social History   Socioeconomic History  . Marital status: Widowed    Spouse name: Not on file  . Number of children: 4  . Years of education: Not on file  . Highest education level: Not on file  Occupational History  . Occupation: disability    Employer: UNEMPLOYED  Social Needs  . Financial resource strain: Not on file  . Food insecurity:    Worry: Not on file    Inability: Not on file  . Transportation needs:    Medical: Not on file    Non-medical: Not on file  Tobacco Use  . Smoking status: Former Smoker    Packs/day: 0.20    Years: 4.00    Pack years: 0.80    Types: Cigarettes    Last attempt to quit: 05/13/1995    Years since quitting: 23.2  . Smokeless tobacco: Never Used  Substance and Sexual Activity  . Alcohol use: Not Currently  . Drug use: No  . Sexual activity: Not Currently  Lifestyle  . Physical activity:    Days per week: Not on file    Minutes per session: Not on file  . Stress: Not on file  Relationships  . Social connections:    Talks on phone: Not on file    Gets together: Not on file    Attends religious service: Not on file    Active member of club or organization: Not on file    Attends meetings of clubs or organizations: Not on  file    Relationship status: Not on file  Other Topics Concern  . Not on file  Social History Narrative   Widow , lives by herself   Lost a son, 3 living    No children in [place], [place] lives in [place], he visits and helps w/ shopping    Lost husband     Family History:  The patient's family history includes Asthma in her mother; Diabetes in her mother and another family member; Hypertension in her sister; Pancreatic cancer in her brother; Stroke in her mother. There is no history of Colon cancer, Prostate cancer, Breast cancer, Esophageal cancer, Liver disease, Rectal cancer, Stomach cancer, or Inflammatory bowel disease.  ROS:   Please see the history of present illness.  All other systems are reviewed and otherwise negative.    PHYSICAL EXAM:   VS:  BP (!) 162/88   Pulse 72   Ht '5\' 9"'$  (1.753 m)   Wt 163 lb 12.8 oz (74.3 kg)   SpO2 96%   BMI 24.19 kg/m   BMI: Body mass index is 24.19 kg/m. GEN: Well nourished, well developed AAF, in no acute distress HEENT: normocephalic, atraumatic Neck: no JVD, carotid bruits, or masses Cardiac: RRR; no murmurs, rubs, or gallops, no edema  Respiratory:  clear to auscultation bilaterally, normal work of breathing GI: soft, nontender, nondistended, + BS MS: no deformity or atrophy Skin: warm and dry, no rash Neuro:  Alert and Oriented x 3, Strength and sensation are intact, follows commands Psych: euthymic mood, full affect  Wt Readings from Last 3 Encounters:  07/19/18 163 lb 12.8 oz (74.3 kg)  06/04/18 179 lb 2 oz (81.3 kg)  06/01/18 179 lb 6.4 oz (81.4 kg)      Studies/Labs Reviewed:   EKG:  EKG was ordered today and personally reviewed by me and demonstrates NSR 67bpm, first degree AVB no acute STT changes.  Recent Labs: 03/04/2018: NT-Pro BNP 1,747 04/27/2018: Magnesium 1.8 05/01/2018: ALT 13; B Natriuretic Peptide 258.1 06/04/2018: BUN 6; Creatinine, Ser 0.88; Hemoglobin 10.1; Platelets 282.0; Potassium 3.4; Sodium 140; TSH  7.63   Lipid Panel    Component Value Date/Time   CHOL 94 11/18/2017 0012   TRIG 110 11/18/2017 0012   HDL 30 (L) 11/18/2017 0012   CHOLHDL 3.1 11/18/2017 0012   VLDL 22 11/18/2017 0012   LDLCALC 42 11/18/2017 0012    Additional studies/ records that were reviewed today include: Summarized above    ASSESSMENT & PLAN:   1. Chronic chest pain - etiology is not totally clear, symptoms not entirely typical for pericarditis although imaging showed this on cardiac MRI. Some of her symptoms seem consistent with GERD. She does not wish to take ibuprofen. We also discussed possible steroid therapy for refractory pericarditis but she wants nothing to do with this. Will continue colchicine in the interim - intolerant to higher doses due to diarrhea.  2. Pericarditis with h/o pericardial effusion - see above. Ultimately I discussed the absolute importance of following up with rheumatology to see if she can get a definitive diagnosis to link together her autoimmune history and perhaps most concerning her profound weight loss. She used to weigh 315lb. 3. Hypokalemia - history of noncompliance with potassium. This has since been followed by primary care with whom she has follow-up. 4. Essential HTN - BP elevated. Med reconciliation was challenging as outlined above. However, pharmacy has only been filling amlodipine '5mg'$  daily pre-packaged in pillpacks despite prior verbal recommendation to patient to increase to 2 tablets daily. She seems to have poor health literacy so probably did not self-enact this change. Will send in new rx for '10mg'$  daily. She states she has f/u coming up with primary care which would be a good time to reassess BP. If additional BP control is needed would consider adding ACEI/ARB. Diuretic is less ideal given her history of low LVEDP and hypokalemia.  Disposition: F/u with Dr. Radford Pax in 3-4 months.  Medication Adjustments/Labs and Tests Ordered: Current medicines are reviewed at  length with the patient today.  Concerns regarding medicines are outlined above. Medication changes, Labs and Tests ordered today are summarized above and listed in the Patient Instructions accessible in Encounters.   Signed, Charlie Pitter, PA-C  07/19/2018 3:23 PM    [facility] [address], [place], [place]  [address] Phone: [phone]; Fax: [phone]

## 2018-07-21 DIAGNOSIS — E1351 Other specified diabetes mellitus with diabetic peripheral angiopathy without gangrene: Secondary | ICD-10-CM | POA: Diagnosis not present

## 2018-07-21 DIAGNOSIS — L84 Corns and callosities: Secondary | ICD-10-CM | POA: Diagnosis not present

## 2018-07-21 DIAGNOSIS — L602 Onychogryphosis: Secondary | ICD-10-CM | POA: Diagnosis not present

## 2018-07-26 ENCOUNTER — Other Ambulatory Visit: Payer: Self-pay

## 2018-07-26 NOTE — Patient Outreach (Signed)
[facility]) Care Management  07/26/2018   [name] 1950-11-12 [id number]  Subjective: Received call from the patient.  HIPAA verified.  The patient called to inform me that her address and phone number has changed.  They have been updated in the system.  The patient states that she still has shortness of breath with exacerbation and wheezing no more than usual.  She states that she is taking her medications, using her inhalers and her oxygen. Discussed with the patient about signs and symptoms and action plan for COPD. Discussed with the patient about staying away from people that are sick, and washing her hands.  She verbalized understanding and she states she is staying in the home. She states that she still has some pain from laying on her right side that she rates at about an 8/10.  She has discussed this with her physician.   She denies any falls and is using her walker.  Her blood sugars have been ranging from 84-150.  Her blood sugar this morning was 87. The patient states that she has an appointment with Dr. Larose Kells 4/27, Dr Loanne Drilling 4/28, and Dr Radford Pax 6/12.   Current Medications:  Current Outpatient Medications  Medication Sig Dispense Refill  . ACCU-CHEK SOFTCLIX LANCETS lancets USE TO CHECK BLOOD SUGAR TWICE A DAY E11.9 100 each 0  . albuterol (PROVENTIL HFA;VENTOLIN HFA) 108 (90 Base) MCG/ACT inhaler Inhale 2 puffs into the lungs every 4 (four) hours as needed for wheezing. 1 Inhaler 6  . amLODipine (NORVASC) 10 MG tablet Take 1 tablet (10 mg total) by mouth daily. 30 tablet 11  . aspirin EC 81 MG tablet Take 1 tablet (81 mg total) by mouth daily. 90 tablet 3  . Blood Glucose Monitoring Suppl (ACCU-CHEK AVIVA) device Use as instructed to check blood sugar twice daily dx E11.9 1 each 0  . colchicine 0.6 MG tablet Take 1 tablet (0.6 mg total) by mouth daily. 90 tablet 3  . escitalopram (LEXAPRO) 20 MG tablet Take 1 tablet (20 mg total) by mouth daily. 30 tablet 3   . Fluticasone-Salmeterol (ADVAIR) 250-50 MCG/DOSE AEPB Inhale 2 puffs into the lungs daily.     Marland Kitchen glucose blood (ACCU-CHEK AVIVA PLUS) test strip TEST BLOOD SUGAR TWICE DAILY AND LANCETS TWICE DAILY E11.9 200 each 11  . latanoprost (XALATAN) 0.005 % ophthalmic solution Place 1 drop into both eyes at bedtime.    Marland Kitchen levothyroxine (SYNTHROID, LEVOTHROID) 75 MCG tablet Take 1 tablet (75 mcg total) by mouth daily before breakfast. 90 tablet 0  . metoprolol tartrate (LOPRESSOR) 25 MG tablet TAKE 2 TABLETS BY MOUTH TWICE DAILY 360 tablet 1  . OVER THE COUNTER MEDICATION CPAP: At bedtime    . OXYGEN Inhale 2 L into the lungs continuous.     . pantoprazole (PROTONIX) 40 MG tablet Take 1 tablet (40 mg total) by mouth daily. 30 tablet 6  . potassium chloride SA (K-DUR,KLOR-CON) 20 MEQ tablet Take 1 tablet (20 mEq total) by mouth 2 (two) times daily. 180 tablet 0  . rosuvastatin (CRESTOR) 40 MG tablet Take 1 tablet (40 mg total) by mouth every evening. 90 tablet 1   No current facility-administered medications for this visit.     Functional Status:  In your present state of health, do you have any difficulty performing the following activities: 04/22/2018 04/01/2018  Hearing? N N  Vision? N N  Difficulty concentrating or making decisions? [name]  Walking or climbing stairs? [name]  Dressing  or bathing? Y Y  Doing errands, shopping? [name]  Preparing Food and eating ? - Y  Using the Toilet? - Y  Comment - has BSC  In the past six months, have you accidently leaked urine? - Y  Comment - wears pull ups  Do you have problems with loss of bowel control? - Y  Managing your Medications? - Y  Managing your Finances? - Y  Housekeeping or managing your Housekeeping? - Y  Comment - -  Some recent data might be hidden    Fall/Depression Screening: Fall Risk  07/26/2018 06/02/2018 04/22/2018  Falls in the past year? 0 1 1  Number falls in past yr: - 0 1  Injury with Fall? - 0 1  Comment - - bruised  Risk  Factor Category  - - -  Risk for fall due to : - - Impaired balance/gait  Risk for fall due to: Comment - - -  Follow up - Falls evaluation completed;Education provided Education provided;Falls prevention discussed   PHQ 2/9 Scores 04/22/2018 04/01/2018 02/10/2018 10/26/2017 08/06/2017 03/31/2017 03/26/2016  PHQ - 2 Score 3 0 3 3 1 2  0  PHQ- 9 Score 10 - 13 13 - 5 -  Exception Documentation - - - - - - -  Not completed - - - - - - -    Assessment: Patient will continue to benefit from health coach outreach for disease management and support. THN CM Care Plan Problem One     Most Recent Value  THN Long Term Goal   In 90 days the patient will verbalize no resdmissions for COPD exacerbation  THN Long Term Goal Start Date  07/26/18  Interventions for Problem One Long Term Goal  Talked with the patient about medication adherence, drinking fliuds, went over signs and symptoms of copd and action plan, discussed staying away from people who are sick and washign hands       Plan: [facility] will contact patient in the month of June and patient agrees to next outreach.    Lazaro Arms RN, BSN, [place] Direct Dial:  [phone]  Fax: [phone]

## 2018-07-28 ENCOUNTER — Telehealth: Payer: Self-pay

## 2018-07-28 NOTE — Telephone Encounter (Signed)
Form completed and mailed in envelope provided w/ last 2 OV notes. Form sent for scanning.   Form mailed to:   [facility], [place] [facility] [place], [place] [address]

## 2018-08-06 ENCOUNTER — Ambulatory Visit: Payer: Self-pay

## 2018-08-11 DIAGNOSIS — G4733 Obstructive sleep apnea (adult) (pediatric): Secondary | ICD-10-CM | POA: Diagnosis not present

## 2018-08-11 DIAGNOSIS — R0902 Hypoxemia: Secondary | ICD-10-CM | POA: Diagnosis not present

## 2018-08-12 ENCOUNTER — Other Ambulatory Visit: Payer: Self-pay | Admitting: Cardiology

## 2018-08-12 MED ORDER — PANTOPRAZOLE SODIUM 40 MG PO TBEC: 40.0000 mg | DELAYED_RELEASE_TABLET | Freq: Every day | ORAL | 3 refills | Status: DC

## 2018-08-12 MED ORDER — AMLODIPINE BESYLATE 10 MG PO TABS: 10.0000 mg | ORAL_TABLET | Freq: Every day | ORAL | 3 refills | Status: DC

## 2018-08-12 NOTE — Telephone Encounter (Signed)
This patient likely has rheumatologic disorder causing her pericarditis and weight loss - we have instructed her multiple times to see Rheumatology - ok to refill colchicine for 1 month but no further refills until she sees Rheumatology - she has a referral pending that Dr. Larose Kells sent in to [facility] - please forward thi to Dr. Larose Kells

## 2018-08-12 NOTE — Telephone Encounter (Signed)
[facility] mail order pharmacy is requesting a refill on Colchicine. Would Dr. Radford Pax like to refill this medication? Please address

## 2018-08-13 ENCOUNTER — Other Ambulatory Visit: Payer: Self-pay

## 2018-08-13 MED ORDER — LEVOTHYROXINE SODIUM 75 MCG PO TABS: 75.0000 ug | ORAL_TABLET | Freq: Every day | ORAL | 1 refills | Status: DC

## 2018-08-13 MED ORDER — ROSUVASTATIN CALCIUM 40 MG PO TABS: 40.0000 mg | ORAL_TABLET | Freq: Every evening | ORAL | 1 refills | Status: DC

## 2018-08-13 MED ORDER — POTASSIUM CHLORIDE CRYS ER 20 MEQ PO TBCR: 20.0000 meq | EXTENDED_RELEASE_TABLET | Freq: Two times a day (BID) | ORAL | 1 refills | Status: DC

## 2018-08-13 MED ORDER — METOPROLOL TARTRATE 25 MG PO TABS: ORAL_TABLET | ORAL | 1 refills | Status: DC

## 2018-08-13 MED ORDER — ESCITALOPRAM OXALATE 20 MG PO TABS: 20.0000 mg | ORAL_TABLET | Freq: Every day | ORAL | 1 refills | Status: DC

## 2018-08-13 MED ORDER — COLCHICINE 0.6 MG PO TABS: 0.6000 mg | ORAL_TABLET | Freq: Every day | ORAL | 0 refills | Status: DC

## 2018-08-13 MED ORDER — ASPIRIN EC 81 MG PO TBEC: 81.0000 mg | DELAYED_RELEASE_TABLET | Freq: Every day | ORAL | 3 refills | Status: AC

## 2018-08-13 NOTE — Telephone Encounter (Signed)
Noted, thank you for keep me posted, care for this pt is limited by social issues

## 2018-08-13 NOTE — Telephone Encounter (Signed)
Spoke with the patient, she expressed understanding about reaching out to Rheumatology. Refilled colchicine for 30 days. She had no further questions.

## 2018-08-13 NOTE — Addendum Note (Signed)
Addended by: Sarina Ill on: 08/13/2018 09:03 AM   Modules accepted: Orders

## 2018-08-13 NOTE — Addendum Note (Signed)
Addended byDamita Dunnings D on: 08/13/2018 02:14 PM   Modules accepted: Orders

## 2018-08-19 DIAGNOSIS — I2510 Atherosclerotic heart disease of native coronary artery without angina pectoris: Secondary | ICD-10-CM | POA: Diagnosis not present

## 2018-08-19 DIAGNOSIS — J9611 Chronic respiratory failure with hypoxia: Secondary | ICD-10-CM | POA: Diagnosis not present

## 2018-08-19 DIAGNOSIS — M6281 Muscle weakness (generalized): Secondary | ICD-10-CM | POA: Diagnosis not present

## 2018-08-19 DIAGNOSIS — I509 Heart failure, unspecified: Secondary | ICD-10-CM | POA: Diagnosis not present

## 2018-08-27 ENCOUNTER — Telehealth: Payer: Self-pay

## 2018-08-27 NOTE — Telephone Encounter (Signed)
Orders received from [facility]- orders signed and faxed to [phone]. Form sent for scanning.

## 2018-09-06 ENCOUNTER — Ambulatory Visit (INDEPENDENT_AMBULATORY_CARE_PROVIDER_SITE_OTHER): Payer: Medicare Other | Admitting: Internal Medicine

## 2018-09-06 ENCOUNTER — Other Ambulatory Visit: Payer: Self-pay

## 2018-09-06 DIAGNOSIS — I10 Essential (primary) hypertension: Secondary | ICD-10-CM | POA: Diagnosis not present

## 2018-09-06 DIAGNOSIS — I313 Pericardial effusion (noninflammatory): Secondary | ICD-10-CM

## 2018-09-06 DIAGNOSIS — E010 Iodine-deficiency related diffuse (endemic) goiter: Secondary | ICD-10-CM

## 2018-09-06 DIAGNOSIS — I3139 Other pericardial effusion (noninflammatory): Secondary | ICD-10-CM

## 2018-09-06 DIAGNOSIS — N181 Chronic kidney disease, stage 1: Secondary | ICD-10-CM

## 2018-09-06 DIAGNOSIS — E1122 Type 2 diabetes mellitus with diabetic chronic kidney disease: Secondary | ICD-10-CM

## 2018-09-06 NOTE — Progress Notes (Signed)
Subjective:    Patient ID: [name], female    DOB: [birth year], 68 y.o.   MRN: [id number]  DOS:  09/06/2018 Type of visit - description: Attempted  to make this a video visit, due to technical difficulties from the patient side it was not possible  thus we proceeded with a Virtual Visit via Telephone    I connected with@ on 09/06/18 at  1:00 PM EDT by telephone and verified that I am speaking with the correct person using two identifiers.  THIS ENCOUNTER IS A VIRTUAL VISIT DUE TO COVID-19 - PATIENT WAS NOT SEEN IN THE OFFICE. PATIENT HAS CONSENTED TO VIRTUAL VISIT / TELEMEDICINE VISIT   Location of patient: home  Location of provider: office  I discussed the limitations, risks, security and privacy concerns of performing an evaluation and management service by telephone and the availability of in person appointments. I also discussed with the patient that there may be a patient responsible charge related to this service. The patient expressed understanding and agreed to proceed.   History of Present Illness: Routine visit I spoke with the patient, she moved to [facility], states overall she is much happier there. She is feeling better but continue w/ chronic/persistent symptoms.  They are not worse than baseline. Reports good compliance with medications.    Review of Systems Continue with occasional nausea and vomiting.  No diarrhea or blood in the stools Continue with chest pain Continue with weakness, uses a walker. Current weight 163 pounds. Still has heartburn.  Wt Readings from Last 3 Encounters:  07/19/18 163 lb 12.8 oz (74.3 kg)  06/04/18 179 lb 2 oz (81.3 kg)  06/01/18 179 lb 6.4 oz (81.4 kg)     Past Medical History:  Diagnosis Date  . Anterolisthesis    Grade 1, L4-5  . Bronchospasm 05/28/2012  . COPD (chronic obstructive pulmonary disease) ([place])   . DEGENERATIVE JOINT DISEASE 10/06/2006  . DEPRESSION 09/26/2008  . DIABETES MELLITUS  10/06/2006  . Diverticulosis    6301,6010  . GERD (gastroesophageal reflux disease) 07/25/2013  . HYPERLIPIDEMIA 01/11/2009  . HYPERTENSION 10/06/2006  . Hypokalemia   . Hypomagnesemia   . INSOMNIA 09/26/2008  . Internal hemorrhoids   . OBSTRUCTIVE SLEEP APNEA 06/23/2008   Severe OSA per sleep study 2010, Rx a CPAP  . Pain in joint, multiple sites 11/10/2006  . Pericardial effusion   . Pericarditis   . UNSPECIFIED ANEMIA 12/10/2009  . UTI (urinary tract infection) 11/2017    Past Surgical History:  Procedure Laterality Date  . BIOPSY  12/28/2017   Procedure: BIOPSY;  Surgeon: Irving Copas., MD;  Location: [place];  Service: Gastroenterology;;  . COLONOSCOPY  08/01/2011   Procedure: COLONOSCOPY;  Surgeon: Inda Castle, MD;  [facility];  Service: Endoscopy;  Laterality: N/A;  . ESOPHAGOGASTRODUODENOSCOPY (EGD) WITH PROPOFOL N/A 12/28/2017   Procedure: ESOPHAGOGASTRODUODENOSCOPY (EGD) WITH PROPOFOL;  Surgeon: Rush Landmark Telford Nab., MD;  Location: [place];  Service: Gastroenterology;  Laterality: N/A;  . LEFT HEART CATH AND CORONARY ANGIOGRAPHY N/A 10/22/2017   Procedure: LEFT HEART CATH AND CORONARY ANGIOGRAPHY;  Surgeon: Jettie Booze, MD;  Location: [place] [facility];  Service: Cardiovascular;  Laterality: N/A;  . 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 Right knee replacement  2005    Social History   Socioeconomic History  . Marital status: Widowed    Spouse name: Not on file  . Number of children: 4  . Years of education: Not  on file  . Highest education level: Not on file  Occupational History  . Occupation: disability    Employer: UNEMPLOYED  Social Needs  . Financial resource strain: Not on file  . Food insecurity:    Worry: Not on file    Inability: Not on file  . Transportation needs:    Medical: Not on file    Non-medical: Not on file  Tobacco Use  . Smoking status: Former Smoker    Packs/day: 0.20    Years: 4.00    Pack years:  0.80    Types: Cigarettes    Last attempt to quit: 05/13/1995    Years since quitting: 23.3  . Smokeless tobacco: Never Used  Substance and Sexual Activity  . Alcohol use: Not Currently  . Drug use: No  . Sexual activity: Not Currently  Lifestyle  . Physical activity:    Days per week: Not on file    Minutes per session: Not on file  . Stress: Not on file  Relationships  . Social connections:    Talks on phone: Not on file    Gets together: Not on file    Attends religious service: Not on file    Active member of club or organization: Not on file    Attends meetings of clubs or organizations: Not on file    Relationship status: Not on file  . Intimate partner violence:    Fear of current or ex partner: Not on file    Emotionally abused: Not on file    Physically abused: Not on file    Forced sexual activity: Not on file  Other Topics Concern  . Not on file  Social History Narrative   Widow , lives by herself   Lost a son, 3 living    No children in [place], [place] lives in [place], he visits and helps w/ shopping    Lost husband      Allergies as of 09/06/2018   No Known Allergies     Medication List       Accurate as of September 06, 2018  3:24 PM. Always use your most recent med list.        Accu-Chek Aviva device Use as instructed to check blood sugar twice daily dx E11.9   Accu-Chek Softclix Lancets lancets USE TO CHECK BLOOD SUGAR TWICE A DAY E11.9   albuterol 108 (90 Base) MCG/ACT inhaler Commonly known as:  VENTOLIN HFA Inhale 2 puffs into the lungs every 4 (four) hours as needed for wheezing.   amLODipine 10 MG tablet Commonly known as:  NORVASC Take 1 tablet (10 mg total) by mouth daily.   aspirin EC 81 MG tablet Take 1 tablet (81 mg total) by mouth daily.   colchicine 0.6 MG tablet Take 1 tablet (0.6 mg total) by mouth daily for 30 days.   escitalopram 20 MG tablet Commonly known as:  Lexapro Take 1 tablet (20 mg total) by mouth daily.    Fluticasone-Salmeterol 250-50 MCG/DOSE Aepb Commonly known as:  ADVAIR Inhale 2 puffs into the lungs daily.   glucose blood test strip Commonly known as:  Accu-Chek Aviva Plus TEST BLOOD SUGAR TWICE DAILY AND LANCETS TWICE DAILY E11.9   latanoprost 0.005 % ophthalmic solution Commonly known as:  XALATAN Place 1 drop into both eyes at bedtime.   levothyroxine 75 MCG tablet Commonly known as:  SYNTHROID Take 1 tablet (75 mcg total) by mouth daily before breakfast.   metoprolol tartrate 25 MG tablet Commonly  known as:  LOPRESSOR TAKE 2 TABLETS BY MOUTH TWICE DAILY   OVER THE COUNTER MEDICATION CPAP: At bedtime   OXYGEN Inhale 2 L into the lungs continuous.   pantoprazole 40 MG tablet Commonly known as:  PROTONIX Take 1 tablet (40 mg total) by mouth daily.   potassium chloride SA 20 MEQ tablet Commonly known as:  K-DUR Take 1 tablet (20 mEq total) by mouth 2 (two) times daily.   rosuvastatin 40 MG tablet Commonly known as:  CRESTOR Take 1 tablet (40 mg total) by mouth every evening.           Objective:   Physical Exam There were no vitals taken for this visit. This was phone visit, alert oriented x3, speaking in complete sentences, no distress that I can tell Reports his weight today is 163 pounds    Assessment     Assessment DM per endocrinology HTN Hyperlipidemia Depression, insomnia (citalopram caused headache ? 05-2014) MSK: --DJD, saw ortho ~06-2014 --Multiple joint pains, 2008, sedimentation rate, RA >> negative --Back pain>> -- MRI 2007: Epidural lipomatosis, congenitally short pedicles, spondylosis, and disc bulges and protrusions contribute to central, foraminal, and subarticular lateral recess stenoses as detailed above.  --2008, saw Ortho, Rx local injections  --local injections back Dr Mina Marble  ~ 12-2015 GI --GERD --Persisting nausea, vomiting, weight loss: Extensive work-up ~ 12/2017. --Colonoscopy due 2023 Pulmonary -Chronic respiratory  failure  -asthma -OSA dx 2010, severe, nocturnal desaturation, intolerant to CPAP, on nocturnal O2 -ONO 01/2017:  + Hypoxia at night, rec  to continue oxygen at night unless able  to tolerate a CPAP   DOE, chronic CP, chronic RUQ pain:: 2004--Cath neg, areterial LE dopplers (-) 06-2003-- abd. u/s fatty liver 01-2005-- CP...neg u/s GB, neg HIDA 10-06--saw cards--cardiolite neg 12-2007 abnormal stress test Cath 02-01-08: normal coronaries  chest pain, stress test echo okay 09/2016 SOB, admitted , cath 10/2017: no CAD H/o Morbid obesity   PLAN:  UY:QIHK A1c 5.0.  On no medications.  HTN: BP elevated at cardiology 07/19/2018, she was amlodipine 5 mg rather than the 10 mg recommended.  Today she reports that is taking all her medications as prescribed, ambulatory BPs are "okay" although she could not tell me any readings. Pericarditis, chronic chest pain: Last visit with cardiology cardiology 07/19/2018 reviewed they recommended to see rheumatology for pericarditis and continue colchicine.  So far has not seen rheumatology  that I know Thyromegaly: Recommended ultrasound 05-2018, referral failed Persistent nausea, GERD,h/o H. pylori infection: sxs at baseline, good PPI compliance. Anemia: Last Hg 10.1.  Stable.  Ferritin elevated. Depression: On Lexapro, seems to be doing well emotionally, actually better than before. Social: The patient moved to [facility], she feels safer and happier there, she has some family. Patient declined to come to the office for blood work consequently we will schedule a face-to-face visit 2 months from today if things with coronavirus are better.   I discussed the assessment and treatment plan with the patient. The patient was provided an opportunity to ask questions and all were answered. The patient agreed with the plan and demonstrated an understanding of the instructions.   The patient was advised to call back or seek an in-person evaluation if the symptoms  worsen or if the condition fails to improve as anticipated.  I provided 16  minutes of non-face-to-face time during this encounter.  Kathlene November, MD

## 2018-09-06 NOTE — Assessment & Plan Note (Signed)
MM:HWKG A1c 5.0.  On no medications.  HTN: BP elevated at cardiology 07/19/2018, she was amlodipine 5 mg rather than the 10 mg recommended.  Today she reports that is taking all her medications as prescribed, ambulatory BPs are "okay" although she could not tell me any readings. Pericarditis, chronic chest pain: Last visit with cardiology cardiology 07/19/2018 reviewed they recommended to see rheumatology for pericarditis and continue colchicine.  So far has not seen rheumatology  that I know Thyromegaly: Recommended ultrasound 05-2018, referral failed Persistent nausea, GERD,h/o H. pylori infection: sxs at baseline, good PPI compliance. Anemia: Last Hg 10.1.  Stable.  Ferritin elevated. Depression: On Lexapro, seems to be doing well emotionally, actually better than before. Social: The patient moved to [facility], she feels safer and happier there, she has some family. Patient declined to come to the office for blood work consequently we will schedule a face-to-face visit 2 months from today if things with coronavirus are better.

## 2018-09-07 ENCOUNTER — Ambulatory Visit: Payer: Self-pay | Admitting: Endocrinology

## 2018-09-10 DIAGNOSIS — R0902 Hypoxemia: Secondary | ICD-10-CM | POA: Diagnosis not present

## 2018-09-10 DIAGNOSIS — G4733 Obstructive sleep apnea (adult) (pediatric): Secondary | ICD-10-CM | POA: Diagnosis not present

## 2018-09-13 ENCOUNTER — Other Ambulatory Visit: Payer: Self-pay | Admitting: Endocrinology

## 2018-09-13 DIAGNOSIS — N181 Chronic kidney disease, stage 1: Principal | ICD-10-CM

## 2018-09-13 DIAGNOSIS — E1122 Type 2 diabetes mellitus with diabetic chronic kidney disease: Secondary | ICD-10-CM

## 2018-09-14 ENCOUNTER — Ambulatory Visit (INDEPENDENT_AMBULATORY_CARE_PROVIDER_SITE_OTHER): Payer: Medicare Other | Admitting: Endocrinology

## 2018-09-14 DIAGNOSIS — N181 Chronic kidney disease, stage 1: Secondary | ICD-10-CM

## 2018-09-14 DIAGNOSIS — E1122 Type 2 diabetes mellitus with diabetic chronic kidney disease: Secondary | ICD-10-CM

## 2018-09-14 NOTE — Progress Notes (Signed)
Subjective:    Patient ID: [name], female    DOB: October 22, 1950, 68 y.o.   MRN: [id number]  HPI  telehealth visit today via Phone x 7 minutes Alternatives to telehealth are presented to this patient, and the patient agrees to the telehealth visit. Pt is advised of the cost of the visit, and agrees to this, also.   Patient is at home, and I am at the office.   Persons attending the telehealth visit: the patient and I Pt returns for f/u of diabetes mellitus: DM type: 2 Dx'ed: 0998 Complications: polyneuropathy and renal insufficiency.   Therapy: no medication now.   GDM: never.  DKA: never. Severe hypoglycemia: once, in 2019 Pancreatitis: never.  Other: she declines weight-loss surgery; she took insulin 2004-2019--she stopped due to weight loss.   Interval history: She has lost still more weight.  She takes [place] as rx'ed.  she brings a record of her cbg's which I have reviewed today.  It varies from 70-100's.  There is no trend throughout the day. Past Medical History:  Diagnosis Date  . Anterolisthesis    Grade 1, L4-5  . Bronchospasm 05/28/2012  . COPD (chronic obstructive pulmonary disease) ([place])   . DEGENERATIVE JOINT DISEASE 10/06/2006  . DEPRESSION 09/26/2008  . DIABETES MELLITUS 10/06/2006  . Diverticulosis    3382,5053  . GERD (gastroesophageal reflux disease) 07/25/2013  . HYPERLIPIDEMIA 01/11/2009  . HYPERTENSION 10/06/2006  . Hypokalemia   . Hypomagnesemia   . INSOMNIA 09/26/2008  . Internal hemorrhoids   . OBSTRUCTIVE SLEEP APNEA 06/23/2008   Severe OSA per sleep study 2010, Rx a CPAP  . Pain in joint, multiple sites 11/10/2006  . Pericardial effusion   . Pericarditis   . UNSPECIFIED ANEMIA 12/10/2009  . UTI (urinary tract infection) 11/2017    Past Surgical History:  Procedure Laterality Date  . BIOPSY  12/28/2017   Procedure: BIOPSY;  Surgeon: Irving Copas., MD;  Location: [place];  Service: Gastroenterology;;  . COLONOSCOPY  08/01/2011    Procedure: COLONOSCOPY;  Surgeon: Inda Castle, MD;  [facility];  Service: Endoscopy;  Laterality: N/A;  . ESOPHAGOGASTRODUODENOSCOPY (EGD) WITH PROPOFOL N/A 12/28/2017   Procedure: ESOPHAGOGASTRODUODENOSCOPY (EGD) WITH PROPOFOL;  Surgeon: Rush Landmark Telford Nab., MD;  Location: [place];  Service: Gastroenterology;  Laterality: N/A;  . LEFT HEART CATH AND CORONARY ANGIOGRAPHY N/A 10/22/2017   Procedure: LEFT HEART CATH AND CORONARY ANGIOGRAPHY;  Surgeon: Jettie Booze, MD;  Location: [place] [facility];  Service: Cardiovascular;  Laterality: N/A;  . 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 Right knee replacement  2005    Social History   Socioeconomic History  . Marital status: Widowed    Spouse name: Not on file  . Number of children: 4  . Years of education: Not on file  . Highest education level: Not on file  Occupational History  . Occupation: disability    Employer: UNEMPLOYED  Social Needs  . Financial resource strain: Not on file  . Food insecurity:    Worry: Not on file    Inability: Not on file  . Transportation needs:    Medical: Not on file    Non-medical: Not on file  Tobacco Use  . Smoking status: Former Smoker    Packs/day: 0.20    Years: 4.00    Pack years: 0.80    Types: Cigarettes    Last attempt to quit: 05/13/1995    Years since quitting: 23.3  . Smokeless tobacco:  Never Used  Substance and Sexual Activity  . Alcohol use: Not Currently  . Drug use: No  . Sexual activity: Not Currently  Lifestyle  . Physical activity:    Days per week: Not on file    Minutes per session: Not on file  . Stress: Not on file  Relationships  . Social connections:    Talks on phone: Not on file    Gets together: Not on file    Attends religious service: Not on file    Active member of club or organization: Not on file    Attends meetings of clubs or organizations: Not on file    Relationship status: Not on file  . Intimate partner violence:    Fear  of current or ex partner: Not on file    Emotionally abused: Not on file    Physically abused: Not on file    Forced sexual activity: Not on file  Other Topics Concern  . Not on file  Social History Narrative   Widow , lives by herself   Lost a son, 3 living    No children in [place], [place] lives in [place], he visits and helps w/ shopping    Lost husband    Current Outpatient Medications on File Prior to Visit  Medication Sig Dispense Refill  . ACCU-CHEK SOFTCLIX LANCETS lancets USE TO CHECK BLOOD SUGAR TWICE A DAY E11.9 100 each 0  . albuterol (PROVENTIL HFA;VENTOLIN HFA) 108 (90 Base) MCG/ACT inhaler Inhale 2 puffs into the lungs every 4 (four) hours as needed for wheezing. 1 Inhaler 6  . amLODipine (NORVASC) 10 MG tablet Take 1 tablet (10 mg total) by mouth daily. 90 tablet 3  . aspirin EC 81 MG tablet Take 1 tablet (81 mg total) by mouth daily. 90 tablet 3  . Blood Glucose Monitoring Suppl (ACCU-CHEK AVIVA) device Use as instructed to check blood sugar twice daily dx E11.9 1 each 0  . colchicine 0.6 MG tablet Take 1 tablet (0.6 mg total) by mouth daily for 30 days. 30 tablet 0  . escitalopram (LEXAPRO) 20 MG tablet Take 1 tablet (20 mg total) by mouth daily. 90 tablet 1  . Fluticasone-Salmeterol (ADVAIR) 250-50 MCG/DOSE AEPB Inhale 2 puffs into the lungs daily.     Marland Kitchen glucose blood (ACCU-CHEK AVIVA PLUS) test strip TEST BLOOD SUGAR TWICE DAILY AND LANCETS TWICE DAILY E11.9 200 each 11  . latanoprost (XALATAN) 0.005 % ophthalmic solution Place 1 drop into both eyes at bedtime.    Marland Kitchen levothyroxine (SYNTHROID, LEVOTHROID) 75 MCG tablet Take 1 tablet (75 mcg total) by mouth daily before breakfast. 90 tablet 1  . metoprolol tartrate (LOPRESSOR) 25 MG tablet TAKE 2 TABLETS BY MOUTH TWICE DAILY 360 tablet 1  . OVER THE COUNTER MEDICATION CPAP: At bedtime    . OXYGEN Inhale 2 L into the lungs continuous.     . pantoprazole (PROTONIX) 40 MG tablet Take 1 tablet (40 mg total) by mouth daily. 90  tablet 3  . potassium chloride SA (K-DUR,KLOR-CON) 20 MEQ tablet Take 1 tablet (20 mEq total) by mouth 2 (two) times daily. 180 tablet 1  . rosuvastatin (CRESTOR) 40 MG tablet Take 1 tablet (40 mg total) by mouth every evening. 90 tablet 1   No current facility-administered medications on file prior to visit.     No Known Allergies  Family History  Problem Relation Age of Onset  . Asthma Mother   . Stroke Mother   . Diabetes Mother   .  Diabetes Other        M, B, S  . Hypertension Sister        M, S,B  . Pancreatic cancer Brother   . Colon cancer Neg Hx   . Prostate cancer Neg Hx   . Breast cancer Neg Hx   . Esophageal cancer Neg Hx   . Liver disease Neg Hx   . Rectal cancer Neg Hx   . Stomach cancer Neg Hx   . Inflammatory bowel disease Neg Hx     There were no vitals taken for this visit.  Review of Systems No change in chronic nausea.      Objective:   Physical Exam      Assessment & Plan:  Type 2 DM with PN: well-controlled Renal insuff: This limits rx options Weight loss: this is the reason for his improved glycemic control.  She needs f/u for this.   Patient Instructions  No medication is needed for the diabetes now. Please continue to see your other doctors for the weight loss.  Please come back for a follow-up appointment in 3 months.

## 2018-09-14 NOTE — Patient Instructions (Signed)
No medication is needed for the diabetes now. Please continue to see your other doctors for the weight loss.  Please come back for a follow-up appointment in 3 months.

## 2018-09-18 DIAGNOSIS — I2510 Atherosclerotic heart disease of native coronary artery without angina pectoris: Secondary | ICD-10-CM | POA: Diagnosis not present

## 2018-09-18 DIAGNOSIS — M6281 Muscle weakness (generalized): Secondary | ICD-10-CM | POA: Diagnosis not present

## 2018-09-18 DIAGNOSIS — I509 Heart failure, unspecified: Secondary | ICD-10-CM | POA: Diagnosis not present

## 2018-09-18 DIAGNOSIS — J9611 Chronic respiratory failure with hypoxia: Secondary | ICD-10-CM | POA: Diagnosis not present

## 2018-09-27 ENCOUNTER — Telehealth: Payer: Self-pay | Admitting: Internal Medicine

## 2018-09-27 DIAGNOSIS — E1122 Type 2 diabetes mellitus with diabetic chronic kidney disease: Secondary | ICD-10-CM

## 2018-09-27 NOTE — Telephone Encounter (Signed)
Patient called the on-call service, needs a refill on Accu-Chek strips RF, checks CBGs twice a day.  Send it to [facility], number is [phone]

## 2018-09-28 MED ORDER — GLUCOSE BLOOD VI STRP: ORAL_STRIP | 11 refills | Status: DC

## 2018-09-28 NOTE — Telephone Encounter (Signed)
Refills sent

## 2018-10-06 ENCOUNTER — Telehealth: Payer: Self-pay

## 2018-10-06 NOTE — Telephone Encounter (Signed)
Called to schedule 3 mo f/u. Declined to schedule. States it is too hard to find someone who is able to transport her to [place] from [place]. States she is going to find a provider closer to home.

## 2018-10-11 DIAGNOSIS — R0902 Hypoxemia: Secondary | ICD-10-CM | POA: Diagnosis not present

## 2018-10-11 DIAGNOSIS — G4733 Obstructive sleep apnea (adult) (pediatric): Secondary | ICD-10-CM | POA: Diagnosis not present

## 2018-10-18 ENCOUNTER — Telehealth: Payer: Self-pay | Admitting: Endocrinology

## 2018-10-18 DIAGNOSIS — E1122 Type 2 diabetes mellitus with diabetic chronic kidney disease: Secondary | ICD-10-CM

## 2018-10-19 DIAGNOSIS — I509 Heart failure, unspecified: Secondary | ICD-10-CM | POA: Diagnosis not present

## 2018-10-19 DIAGNOSIS — I2510 Atherosclerotic heart disease of native coronary artery without angina pectoris: Secondary | ICD-10-CM | POA: Diagnosis not present

## 2018-10-20 ENCOUNTER — Other Ambulatory Visit: Payer: Self-pay | Admitting: Internal Medicine

## 2018-10-20 NOTE — Telephone Encounter (Signed)
Copied from [place] [id number]. Topic: Quick Communication - Rx Refill/Question >> Oct 20, 2018 12:28 PM Erick Blinks wrote: Medication: Accu-Chek Softclix Lancets lancets [[id number]] - pt needs this sent to new pharmacy listed below.  Has the patient contacted their pharmacy? Yes  (Agent: If no, request that the patient contact the pharmacy for the refill.) (Agent: If yes, when and what did the pharmacy advise?)  Preferred Pharmacy (with phone number or street name): [facility] [place], [place], [place] [address] [phone]  Agent: Please be advised that RX refills may take up to 3 business days. We ask that you follow-up with your pharmacy.

## 2018-10-20 NOTE — Telephone Encounter (Signed)
Dr. Loanne Drilling handles DM meds and supplies.

## 2018-10-21 ENCOUNTER — Telehealth: Payer: Self-pay

## 2018-10-21 NOTE — Progress Notes (Signed)
Virtual Visit via Telephone Note   This visit type was conducted due to national recommendations for restrictions regarding the COVID-19 Pandemic (e.g. social distancing) in an effort to limit this patient's exposure and mitigate transmission in our community.  Due to her co-morbid illnesses, this patient is at least at moderate risk for complications without adequate follow up.  This format is felt to be most appropriate for this patient at this time.  The patient did not have access to video technology/had technical difficulties with video requiring transitioning to audio format only (telephone).  All issues noted in this document were discussed and addressed.  No physical exam could be performed with this format.  Please refer to the patient's chart for her  consent to telehealth for [facility]   Evaluation Performed:  Follow-up visit  This visit type was conducted due to national recommendations for restrictions regarding the COVID-19 Pandemic (e.g. social distancing).  This format is felt to be most appropriate for this patient at this time.  All issues noted in this document were discussed and addressed.  No physical exam was performed (except for noted visual exam findings with Video Visits).  Please refer to the patient's chart (MyChart message for video visits and phone note for telephone visits) for the patient's consent to telehealth for [facility].  Date:  10/22/2018   ID:  [name], DOB 02-02-1951, MRN [id number]  Patient Location:  Home  Provider location:   [place]  PCP:  Colon Branch, MD  Cardiologist:  Fransico Him, MD  Electrophysiologist:  None   Chief Complaint:  HTN, lipids, pericardial effusion/pericarditis  History of Present Illness:    [name] is a 68 y.o. female who presents via audio/video conferencing for a telehealth visit today.    [name] is a 68 y.o. 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  She had a previous Myoview obtained on 09/10/2016 that showed EF 45% with normal perfusion. Concurrent echocardiogram 09/09/2016 showed EF 55 to 60%, no wall motion abnormality. She was admitted on 10/19/2017 with acute onset of shortness of breath.CTA of the chest revealed multifocal groundglass opacity with differentials include pulmonary edema versus infectious versus inflammatory etiology.There was also small to moderate pericardial effusion measuring up to 2 cm on the CTA. Her TSH was high, Synthroid was increased. She was also severely hypoglycemic and was given D50.Cardiology service was consulted for pericardial effusion. Echocardiogram obtained on 10/20/2017 showed EF 55 to 60%, mild LVH, mild pericardial effusion.She eventually underwent cardiac catheterization 10/22/2017 showed normal coronaries, EF 55 to 65%. Due to low LVEDP on cath, Lasix was stopped. Due to concern of her symptoms related to pericarditis, patient was restarted on ibuprofen and colchicine.It was also recommended for her to follow-up with rheumatology for further evaluation but she did not attend this appointment. Apparently there have been issues getting someone to take her due to prior noncompliance.   She was readmitted on 11/12/2017 with shortness of breath and recurrent chest discomfort. She had positive ANA, RNP, anti-Smith and, chromatin. 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 Pulmonology service recommended discontinuation of diltiazem as positive causative agent  and a repeat  chest CT in 8 weeks. Unfortunately, she was readmitted on 11/17/17 with generalized weakness and tachycardia.He was found to have a urinary tract infection and diarrhea. C. difficile was negative. She was ruled out of PE through VQ scan. On 11/19/17, she had unresponsive episode. Code stroke was activated and the patient was evaluated by neurology service.EEG was unremarkable. MRI of the brain was negative.She did develop acute renal failure felt to be prerenal.Renal ultrasound was normal. Her Micardis was stopped.She was eventually discharged home. She has also been noted to have significant weight loss over the past year of unclear etiology.  At [place] 12/2017 she continued to have chest pain worse with lying down or taking a deep breath. This was felt possibly MSK. Repeat limited echo showed a trivial pericardial effusion posterior to the heart, improved from prior. When she saw Dr. Radford Pax 02/2018 she was still having chest discomfort but felt maybe GERD in etiology. CRP and ESR remained persistently elevated.Cardiac MRI 03/25/19 showed normal EF 56%, normal RV, mildly dilated PA, mild almost circumferential pericardial effusion, pericardium has normal thickness but there is late gadolinium enhancement consistent with inflammation, findings felt consistent with acute pericarditis. She was asked to resume ibuprofen x 2 weeks and colchicine. When she saw Cecilie Kicks NP back in follow-up 04/2018 there were a mutitude of issues including diarrhea with colchicine, noncompliance with treatment for UTI, noncompliance with potassium supplement, social issues with transportation and continued weight loss. Last labs 06/04/18 showed K 3.4, Cr 0.88, WBC 3.0, Hgb 10.1, plt 282, TSH 7.6 for which meds were adjusted. She had a CT done in the ER 05/02/19 which showed improved diffuse ground-glass opacities and lower lobe bronchiectasis, patulous esophagus and interstitial lung disease which can be associated with  scleroderma.  She was seen back by Melina Copa, PA 07/19/2018 continuing to complain of CP but some sx c/w more with GERD.  She did not want to take Ibuprofen and colchicine gave her diarrhea. The absolute importance of following up with rheumatology to see if she can get a definitive diagnosis to link together her autoimmune history and perhaps most concerning her profound weight loss was discussed at length during this OV.    She is here today for followup and is doing well.  She denies any PND, orthopnea, LE edema, dizziness, palpitations or syncope.  She says that she is having problems with hand numbness and abdominal pain.  She also had continued chest pain that has not changed any from prior episodes.  It occurs when she sleeps on her left side.  She is having a lot of GERD at night.  She is compliant with her meds and is tolerating meds with no SE.  She has run out of her colchicine.  She has not wanted to take Ibuprofen due to her GERD.  She takes the protonix for her GERD.  She has not seen her PCP or the Rheumatologist. She continues to have SOB when she lays flat at night. She says that she is feeling very bad with her abdomen.  She talked with Dr. Larose Kells office and they wanted to give her an appt but she refused and wanted to get a PCP in [place].  The patient does not have symptoms concerning for COVID-19 infection (fever, chills, cough, or new shortness of breath).    Prior CV studies:   The following studies were reviewed today: Cardiac MRI  Past Medical History:  Diagnosis Date   Anterolisthesis  Grade 1, L4-5   Bronchospasm 05/28/2012   COPD (chronic obstructive pulmonary disease) ([place])    DEGENERATIVE JOINT DISEASE 10/06/2006   DEPRESSION 09/26/2008   DIABETES MELLITUS 10/06/2006   Diverticulosis    [id number]   GERD (gastroesophageal reflux disease) 07/25/2013   HYPERLIPIDEMIA 01/11/2009   HYPERTENSION 10/06/2006   Hypokalemia    Hypomagnesemia    INSOMNIA  09/26/2008   Internal hemorrhoids    OBSTRUCTIVE SLEEP APNEA 06/23/2008   Severe OSA per sleep study 2010, Rx a CPAP   Pain in joint, multiple sites 11/10/2006   Pericardial effusion    Pericarditis    UNSPECIFIED ANEMIA 12/10/2009   UTI (urinary tract infection) 11/2017   Past Surgical History:  Procedure Laterality Date   BIOPSY  12/28/2017   Procedure: BIOPSY;  Surgeon: Irving Copas., MD;  Location: [facility] ENDOSCOPY;  Service: Gastroenterology;;   COLONOSCOPY  08/01/2011   Procedure: COLONOSCOPY;  Surgeon: Inda Castle, MD;  Location: [facility] ENDOSCOPY;  Service: Endoscopy;  Laterality: N/A;   ESOPHAGOGASTRODUODENOSCOPY (EGD) WITH PROPOFOL N/A 12/28/2017   Procedure: ESOPHAGOGASTRODUODENOSCOPY (EGD) WITH PROPOFOL;  Surgeon: Rush Landmark Telford Nab., MD;  Location: [facility];  Service: Gastroenterology;  Laterality: N/A;   LEFT HEART CATH AND CORONARY ANGIOGRAPHY N/A 10/22/2017   Procedure: LEFT HEART CATH AND CORONARY ANGIOGRAPHY;  Surgeon: Jettie Booze, MD;  Location: [facility] CV LAB;  Service: Cardiovascular;  Laterality: N/A;   Left knee replacement  07/2007   Right knee replacement  2005     Current Meds  Medication Sig   Accu-Chek Softclix Lancets lancets USE TO CHECK BLOOD SUGAR TWICE A DAY   albuterol (PROVENTIL HFA;VENTOLIN HFA) 108 (90 Base) MCG/ACT inhaler Inhale 2 puffs into the lungs every 4 (four) hours as needed for wheezing.   amLODipine (NORVASC) 10 MG tablet Take 1 tablet (10 mg total) by mouth daily.   aspirin EC 81 MG tablet Take 1 tablet (81 mg total) by mouth daily.   Blood Glucose Monitoring Suppl (ACCU-CHEK AVIVA) device Use as instructed to check blood sugar twice daily dx E11.9   colchicine 0.6 MG tablet Take 1 tablet (0.6 mg total) by mouth daily for 30 days.   escitalopram (LEXAPRO) 20 MG tablet Take 1 tablet (20 mg total) by mouth daily.   glucose blood (ACCU-CHEK AVIVA PLUS) test strip TEST BLOOD SUGAR TWICE DAILY AND  LANCETS TWICE DAILY E11.9   latanoprost (XALATAN) 0.005 % ophthalmic solution Place 1 drop into both eyes at bedtime.   levothyroxine (SYNTHROID, LEVOTHROID) 75 MCG tablet Take 1 tablet (75 mcg total) by mouth daily before breakfast.   metoprolol tartrate (LOPRESSOR) 25 MG tablet TAKE 2 TABLETS BY MOUTH TWICE DAILY   OVER THE COUNTER MEDICATION CPAP: At bedtime   OXYGEN Inhale 2 L into the lungs continuous.    pantoprazole (PROTONIX) 40 MG tablet Take 1 tablet (40 mg total) by mouth daily.   potassium chloride SA (K-DUR,KLOR-CON) 20 MEQ tablet Take 1 tablet (20 mEq total) by mouth 2 (two) times daily.   rosuvastatin (CRESTOR) 40 MG tablet Take 1 tablet (40 mg total) by mouth every evening.     Allergies:   Patient has no known allergies.   Social History   Tobacco Use   Smoking status: Former Smoker    Packs/day: 0.20    Years: 4.00    Pack years: 0.80    Types: Cigarettes    Quit date: 05/13/1995    Years since quitting: 23.4   Smokeless tobacco: Never Used  Substance Use Topics   Alcohol use: Not Currently   Drug use: No     Family Hx: The patient's family history includes Asthma in her mother; Diabetes in her mother and another family member; Hypertension in her sister; Pancreatic cancer in her brother; Stroke in her mother. There is no history of Colon cancer, Prostate cancer, Breast cancer, Esophageal cancer, Liver disease, Rectal cancer, Stomach cancer, or Inflammatory bowel disease.  ROS:   Please see the history of present illness.     All other systems reviewed and are negative.   Labs/Other Tests and Data Reviewed:    Recent Labs: 03/04/2018: NT-Pro BNP 1,747 04/27/2018: Magnesium 1.8 05/01/2018: ALT 13; B Natriuretic Peptide 258.1 06/04/2018: BUN 6; Creatinine, Ser 0.88; Hemoglobin 10.1; Platelets 282.0; Potassium 3.4; Sodium 140; TSH 7.63   Recent Lipid Panel Lab Results  Component Value Date/Time   CHOL 94 11/18/2017 12:12 AM   TRIG 110  11/18/2017 12:12 AM   HDL 30 (L) 11/18/2017 12:12 AM   CHOLHDL 3.1 11/18/2017 12:12 AM   LDLCALC 42 11/18/2017 12:12 AM    Wt Readings from Last 3 Encounters:  10/22/18 150 lb (68 kg)  07/19/18 163 lb 12.8 oz (74.3 kg)  06/04/18 179 lb 2 oz (81.3 kg)     Objective:    Vital Signs:  Ht '5\' 9"'$  (1.753 m)    Wt 150 lb (68 kg)    BMI 22.15 kg/m    ASSESSMENT & PLAN:    1.  Chronic chest pain - she continues to have chronic chest pain that is very nonspecific but has not changed any since she was seen by Melina Copa, PA.  Her last Cardiac MRI showed persistent pericardial inflammation despite ongoing colchicine use at the lower dose of 0.'6mg'$  daily (cannot tolerate higher doses due to diarrhea).  She is very NONCOMPLIANT with MD followup as well as medication followup.  She has run out of her colchicine again and needs a refill.  I had a very long and [place] discussion with her today.  She has not followed up with her PCP and tells me she continues to have abdominal pain and lots of other nonspecific complaints.  At first she told me that she saw Dr. Larose Kells a few weeks ago but when I read her the last note in the chart that they offered her an OV and she declined stating that she wanted an MD in [place] she changed her reply to she needs a doctor closer to her but she will go back to Dr. Larose Kells.  I instructed her to go to an Urgent Care in [place] to be evaluated this week. Her CP is not ischemia (normal coronary arteries by cath 2019).  Her CP is likely related to pulmonary and pericardial inflammation from her underlying rheumatologic dz that remains undiagnosed except by lab work as she has not been compliant with keeping her initial OV with Rheumatology.  She has had severe MDs in town dismiss her for not keeping appt.    I told her that I would refill her Colchicine for 2 more months but in the mean time she needed to go to Urgent Care in [place] or see Dr. Larose Kells back as well as see Rheumatology  and if she has not seen them within the 2 month timeframe I will not longer refill her Colchcine and will dismiss her from our practice for noncompliance with medical recommendations.  I explained to her that the colchicine should not be taken long  term and she needs to see Rheumatology to determine the underlying rheumatological d/o that is present as indicated by prior blood work to determine which immunosuppressive drugs to put her on.  She understands the recommendations and potential dismissal if not followed through on by 2 months from now.  2.  Pericarditis -  This has been  persistent and trivial by 2D echo in 12/2017 and Cardiac MRI in 03/2018 with late gad enhancement in the LV.  She continues to have CP and refuses NSAIDs due to GERD and can only take low dose colchicine due to diarrhea.  Please refer to above #1.  [facility] to make sure there has not been an increase in the effusion given her abdominal sx.  Again, as stated above she needs to see Rheumatology [facility].  3.  Weight loss - again likely related to underlying autoimmune disorder - per PCP and Rheum  4.  Hypertension - she has no way to check her BP. This is followed by her PCP  COVID-19 Education: The signs and symptoms of COVID-19 were discussed with the patient and how to seek care for testing (follow up with PCP or arrange E-visit).  The importance of social distancing was discussed today.  Patient Risk:   After full review of this patient's clinical status, I feel that they are at least moderate risk at this time.  Time:   Today, I have spent 15 minutes directly with the patient on telephone discussing medical problems including Chest pain, abdominal pain, pericarditis.  We also reviewed the symptoms of COVID 19 and the ways to protect against contracting the virus with telehealth technology.  I spent an additional 10 minutes reviewing patient's chart including 2D echo, cardiac MRI, cath.  Medication  Adjustments/Labs and Tests Ordered: Current medicines are reviewed at length with the patient today.  Concerns regarding medicines are outlined above.  Tests Ordered: No orders of the defined types were placed in this encounter.  Medication Changes: No orders of the defined types were placed in this encounter.   Disposition:  Follow up in 4 week(s) with Melina Copa, PA  Signed, Fransico Him, MD  10/22/2018 3:29 PM    [place]

## 2018-10-21 NOTE — Telephone Encounter (Signed)

## 2018-10-22 ENCOUNTER — Telehealth (INDEPENDENT_AMBULATORY_CARE_PROVIDER_SITE_OTHER): Payer: Medicare Other | Admitting: Cardiology

## 2018-10-22 ENCOUNTER — Other Ambulatory Visit: Payer: Self-pay

## 2018-10-22 ENCOUNTER — Encounter: Payer: Self-pay | Admitting: Cardiology

## 2018-10-22 ENCOUNTER — Ambulatory Visit: Payer: Self-pay | Admitting: Cardiology

## 2018-10-22 ENCOUNTER — Ambulatory Visit: Payer: Medicare Other | Admitting: Cardiology

## 2018-10-22 DIAGNOSIS — R079 Chest pain, unspecified: Secondary | ICD-10-CM

## 2018-10-22 DIAGNOSIS — I313 Pericardial effusion (noninflammatory): Secondary | ICD-10-CM

## 2018-10-22 DIAGNOSIS — I3139 Other pericardial effusion (noninflammatory): Secondary | ICD-10-CM

## 2018-10-22 DIAGNOSIS — I10 Essential (primary) hypertension: Secondary | ICD-10-CM | POA: Diagnosis not present

## 2018-10-22 MED ORDER — COLCHICINE 0.6 MG PO TABS: 0.6000 mg | ORAL_TABLET | Freq: Every day | ORAL | 1 refills | Status: DC

## 2018-10-22 NOTE — Patient Instructions (Addendum)
Medication Instructions:  Your physician recommends that you continue on your current medications as directed. Please refer to the Current Medication list given to you today.  If you need a refill on your cardiac medications before your next appointment, please call your pharmacy.   Lab work: None If you have labs (blood work) drawn today and your tests are completely normal, you will receive your results only by: Marland Kitchen MyChart Message (if you have MyChart) OR . A paper copy in the mail If you have any lab test that is abnormal or we need to change your treatment, we will call you to review the results.  Testing/Procedures: Your physician has requested that you have an echocardiogram. Echocardiography is a painless test that uses sound waves to create images of your heart. It provides your doctor with information about the size and shape of your heart and how well your heart's chambers and valves are working. This procedure takes approximately one hour. There are no restrictions for this procedure.  Follow-Up: Melina Copa PA-C, on 12/24/18 at 3:30 pm  Any Other Special Instructions Will Be Listed Below (If Applicable).  Contact Dr, Gavin Pound: Rheumatology for an appointment

## 2018-10-25 ENCOUNTER — Telehealth: Payer: Self-pay | Admitting: Cardiology

## 2018-10-25 NOTE — Telephone Encounter (Signed)
Please have her Call Dr. Larose Kells to get new appt with Rheumatology

## 2018-10-25 NOTE — Telephone Encounter (Signed)
New message   Patient states that she attempted to get in contact with the Rheumatologist Dr. Lenna Gilford and was not able to get in contact with this doctor. Please call to discuss.

## 2018-10-25 NOTE — Telephone Encounter (Signed)
Pt states she was instructed to call and inform Dr. Radford Pax and RN that she made contact with both Dr. Larose Kells PCP office and Dr. Lenna Gilford Rheumatology. Pt states she has an appt with Dr Larose Kells for this Thursday 6/18 via virtual visit, and she is waiting on Dr. Lenna Gilford to call back with her appt with their office. Informed the pt that I will pass this message along to Dr. Radford Pax and RN for their review, upon return to the office. Pt verbalized understanding and agrees with this plan.

## 2018-10-25 NOTE — Telephone Encounter (Signed)
New message:    Patient calling stating that Dr. Heron Nay told the patient to call her other doctor to get appt. Please call if any more questions.

## 2018-10-27 ENCOUNTER — Other Ambulatory Visit: Payer: Self-pay

## 2018-10-27 ENCOUNTER — Telehealth: Payer: Self-pay | Admitting: Internal Medicine

## 2018-10-27 DIAGNOSIS — I319 Disease of pericardium, unspecified: Secondary | ICD-10-CM

## 2018-10-27 MED ORDER — ACCU-CHEK AVIVA PLUS VI STRP: ORAL_STRIP | 11 refills | Status: DC

## 2018-10-27 NOTE — Telephone Encounter (Signed)
Pt moved and changed pharmacy. She only has 2 lancets left. [facility] (old pharmacy) will not transfer.  Please send.  [place], [address]  [address] [place] [address]  Phone: [phone] Fax: [phone]

## 2018-10-27 NOTE — Telephone Encounter (Signed)
New referral placed.

## 2018-10-27 NOTE — Telephone Encounter (Signed)
Pt called stating she contacted [facility] for Dr. Leigh Aurora and was told they need a new referral before she can be scheduled.

## 2018-10-27 NOTE — Telephone Encounter (Signed)
Left message to contact Dr. Larose Kells, per dpr.

## 2018-10-27 NOTE — Patient Outreach (Addendum)
[facility]) Care Management  10/27/2018   [name] 09-06-1950 [id number]  Subjective: Successful outreach to the patient.  Two patient identifiers obtained.  The patient states that she has been doing fair.  She denies any wheezing or cough.  She states that she has pain all over her body  Which is chronic.  She states that she has shortness of breath with exertion that is usual. She states that she uses her oxygen and another machine.   She was unable to tell me what the machine was.  She states that she does not have her inhalers and she is not sure if she has refills.  Advised the patient to call her physician office for refills and also to make a follow up appointment.  She stated that she would.  She states that she still has chest pain at night and reflux.  She recently had a visit with her cardiologist on 6/12 and discussed the issue with the physician.   Current Medications:  Current Outpatient Medications  Medication Sig Dispense Refill  . Accu-Chek Softclix Lancets lancets USE TO CHECK BLOOD SUGAR TWICE A DAY 200 each 0  . amLODipine (NORVASC) 10 MG tablet Take 1 tablet (10 mg total) by mouth daily. 90 tablet 3  . aspirin EC 81 MG tablet Take 1 tablet (81 mg total) by mouth daily. 90 tablet 3  . Blood Glucose Monitoring Suppl (ACCU-CHEK AVIVA) device Use as instructed to check blood sugar twice daily dx E11.9 1 each 0  . colchicine 0.6 MG tablet Take 1 tablet (0.6 mg total) by mouth daily. 30 tablet 1  . escitalopram (LEXAPRO) 20 MG tablet Take 1 tablet (20 mg total) by mouth daily. 90 tablet 1  . glucose blood (ACCU-CHEK AVIVA PLUS) test strip TEST BLOOD SUGAR TWICE DAILY AND LANCETS TWICE DAILY E11.9 200 each 11  . latanoprost (XALATAN) 0.005 % ophthalmic solution Place 1 drop into both eyes at bedtime.    Marland Kitchen levothyroxine (SYNTHROID, LEVOTHROID) 75 MCG tablet Take 1 tablet (75 mcg total) by mouth daily before breakfast. 90 tablet 1  . metoprolol tartrate  (LOPRESSOR) 25 MG tablet TAKE 2 TABLETS BY MOUTH TWICE DAILY 360 tablet 1  . OVER THE COUNTER MEDICATION CPAP: At bedtime    . OXYGEN Inhale 2 L into the lungs continuous.     . pantoprazole (PROTONIX) 40 MG tablet Take 1 tablet (40 mg total) by mouth daily. 90 tablet 3  . potassium chloride SA (K-DUR,KLOR-CON) 20 MEQ tablet Take 1 tablet (20 mEq total) by mouth 2 (two) times daily. 180 tablet 1  . rosuvastatin (CRESTOR) 40 MG tablet Take 1 tablet (40 mg total) by mouth every evening. 90 tablet 1  . albuterol (PROVENTIL HFA;VENTOLIN HFA) 108 (90 Base) MCG/ACT inhaler Inhale 2 puffs into the lungs every 4 (four) hours as needed for wheezing. (Patient not taking: Reported on 10/27/2018) 1 Inhaler 6  . Fluticasone-Salmeterol (ADVAIR) 250-50 MCG/DOSE AEPB Inhale 2 puffs into the lungs daily.      No current facility-administered medications for this visit.     Functional Status:  In your present state of health, do you have any difficulty performing the following activities: 04/22/2018 04/01/2018  Hearing? N N  Vision? N N  Difficulty concentrating or making decisions? [name]  Walking or climbing stairs? Y Y  Dressing or bathing? Y Y  Doing errands, shopping? [name]  Preparing Food and eating ? - Y  Using the Toilet? - Y  Comment - has BSC  In the past six months, have you accidently leaked urine? - Y  Comment - wears pull ups  Do you have problems with loss of bowel control? - Y  Managing your Medications? - Y  Managing your Finances? - Y  Housekeeping or managing your Housekeeping? - Y  Comment - -  Some recent data might be hidden    Fall/Depression Screening: Fall Risk  10/27/2018 07/26/2018 06/02/2018  Falls in the past year? 0 0 1  Number falls in past yr: - - 0  Injury with Fall? - - 0  Comment - - -  Risk Factor Category  - - -  Risk for fall due to : - - -  Risk for fall due to: Comment - - -  Follow up - - Falls evaluation completed;Education provided   [facility] 2/9 Scores  04/22/2018 04/01/2018 02/10/2018 10/26/2017 08/06/2017 03/31/2017 03/26/2016  PHQ - 2 Score 3 0 3 3 1 2  0  PHQ- 9 Score 10 - 13 13 - 5 -  Exception Documentation - - - - - - -  Not completed - - - - - - -    Assessment: Patient will continue to benefit from health coach outreach for disease management and support. [facility] CM Care Plan Problem One     Most Recent Value  [facility] Long Term Goal   In 30 days the patient will verbalize no readmissions for COPD exacerbation  [facility] Long Term Goal Start Date  10/27/18  Interventions for Problem One Long Term Goal  Reviewed ssigns and symptoms of COPD, reviewed medications and encouraged medication adherence,  encouragedd the patient to Attend all of her appointments and make follow up appointments       Plan: RN Health Coach will contact patient in the month of July and patient agrees to next outreach. Referral was placed to pharmacy for medication reconciliation to help the patient with her inhalers.  Lazaro Arms RN, BSN, [place] Direct Dial:  [phone]  Fax: [phone]

## 2018-10-27 NOTE — Addendum Note (Signed)
Addended byDamita Dunnings D on: 10/27/2018 10:27 AM   Modules accepted: Orders

## 2018-10-27 NOTE — Addendum Note (Signed)
Addended by: Waldon Reining on: 10/27/2018 04:37 PM   Modules accepted: Orders

## 2018-10-27 NOTE — Telephone Encounter (Signed)
Rx sent 

## 2018-10-28 ENCOUNTER — Other Ambulatory Visit: Payer: Self-pay

## 2018-10-28 ENCOUNTER — Ambulatory Visit (INDEPENDENT_AMBULATORY_CARE_PROVIDER_SITE_OTHER): Payer: Medicare Other | Admitting: Internal Medicine

## 2018-10-28 DIAGNOSIS — R079 Chest pain, unspecified: Secondary | ICD-10-CM

## 2018-10-28 DIAGNOSIS — J9801 Acute bronchospasm: Secondary | ICD-10-CM | POA: Diagnosis not present

## 2018-10-28 DIAGNOSIS — E1122 Type 2 diabetes mellitus with diabetic chronic kidney disease: Secondary | ICD-10-CM | POA: Diagnosis not present

## 2018-10-28 DIAGNOSIS — N181 Chronic kidney disease, stage 1: Secondary | ICD-10-CM

## 2018-10-28 DIAGNOSIS — I319 Disease of pericardium, unspecified: Secondary | ICD-10-CM | POA: Diagnosis not present

## 2018-10-28 DIAGNOSIS — F32A Depression, unspecified: Secondary | ICD-10-CM

## 2018-10-28 DIAGNOSIS — F329 Major depressive disorder, single episode, unspecified: Secondary | ICD-10-CM | POA: Diagnosis not present

## 2018-10-28 MED ORDER — ACCU-CHEK SOFTCLIX LANCETS MISC: 12 refills | Status: DC

## 2018-10-28 MED ORDER — ALBUTEROL SULFATE HFA 108 (90 BASE) MCG/ACT IN AERS: 2.0000 | INHALATION_SPRAY | RESPIRATORY_TRACT | 5 refills | Status: AC | PRN

## 2018-10-28 MED ORDER — FLUTICASONE-SALMETEROL 250-50 MCG/DOSE IN AEPB: 2.0000 | INHALATION_SPRAY | Freq: Every day | RESPIRATORY_TRACT | 5 refills | Status: DC

## 2018-10-28 NOTE — Progress Notes (Signed)
Subjective:    Patient ID: [name], female    DOB: 1950-07-13, 68 y.o.   MRN: [id number]  DOS:  10/28/2018 Type of visit - description: Attempted  to make this a video visit, due to technical difficulties from the patient side it was not possible  thus we proceeded with a Virtual Visit via Telephone    I connected with@ on 10/28/18 at 11:00 AM EDT by telephone and verified that I am speaking with the correct person using two identifiers.  THIS ENCOUNTER IS A VIRTUAL VISIT DUE TO COVID-19 - PATIENT WAS NOT SEEN IN THE OFFICE. PATIENT HAS CONSENTED TO VIRTUAL VISIT / TELEMEDICINE VISIT   Location of patient: home  Location of provider: office  I discussed the limitations, risks, security and privacy concerns of performing an evaluation and management service by telephone and the availability of in person appointments. I also discussed with the patient that there may be a patient responsible charge related to this service. The patient expressed understanding and agreed to proceed.   History of Present Illness: Follow-up Saw cardiology 10/22/2018: She was evaluated with chronic chest pain, they noted that the MRI continues to show pericardial inflammation despite colchicine use. She continue with GI complaints. Her chest pain is felt not to be cardiac. They strongly recommend to be seen by rheumatology and recommend colchicine for now but they will stop prescribing it in 2 months if she is not seen by rheumatology.   Today she confirms that she continue with chest pain on and off. We went over his medications and reports good compliance. Does not check ambulatory BPs frequent, "I have 3  blood pressure machines and I do not know if they are accurate"  Needs refills on Advair, albuterol lancets.  Review of Systems Reports on and off shortness of breath Denies fever chills Very rarely has cough Stills has occasional heartburn described as mild and occasional vomiting. No  abdominal pain or blood in the stools.  Past Medical History:  Diagnosis Date  . Anterolisthesis    Grade 1, L4-5  . Bronchospasm 05/28/2012  . COPD (chronic obstructive pulmonary disease) ([place])   . DEGENERATIVE JOINT DISEASE 10/06/2006  . DEPRESSION 09/26/2008  . DIABETES MELLITUS 10/06/2006  . Diverticulosis    [id number]  . GERD (gastroesophageal reflux disease) 07/25/2013  . HYPERLIPIDEMIA 01/11/2009  . HYPERTENSION 10/06/2006  . Hypokalemia   . Hypomagnesemia   . INSOMNIA 09/26/2008  . Internal hemorrhoids   . OBSTRUCTIVE SLEEP APNEA 06/23/2008   Severe OSA per sleep study 2010, Rx a CPAP  . Pain in joint, multiple sites 11/10/2006  . Pericardial effusion   . Pericarditis   . UNSPECIFIED ANEMIA 12/10/2009  . UTI (urinary tract infection) 11/2017    Past Surgical History:  Procedure Laterality Date  . BIOPSY  12/28/2017   Procedure: BIOPSY;  Surgeon: Irving Copas., MD;  Location: [facility];  Service: Gastroenterology;;  . COLONOSCOPY  08/01/2011   Procedure: COLONOSCOPY;  Surgeon: Inda Castle, MD;  Location: [facility];  Service: Endoscopy;  Laterality: N/A;  . ESOPHAGOGASTRODUODENOSCOPY (EGD) WITH PROPOFOL N/A 12/28/2017   Procedure: ESOPHAGOGASTRODUODENOSCOPY (EGD) WITH PROPOFOL;  Surgeon: Rush Landmark Telford Nab., MD;  Location: [facility] [facility];  Service: Gastroenterology;  Laterality: N/A;  . LEFT HEART CATH AND CORONARY ANGIOGRAPHY N/A 10/22/2017   Procedure: LEFT HEART CATH AND CORONARY ANGIOGRAPHY;  Surgeon: Jettie Booze, MD;  Location: [place] [facility];  Service: Cardiovascular;  Laterality: N/A;  . 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 Right knee replacement  2005    Social History   Socioeconomic History  . Marital status: Widowed    Spouse name: Not on file  . Number of children: 4  . Years of education: Not on file  . Highest education level: Not on file  Occupational History  . Occupation: disability    Employer: UNEMPLOYED  Social Needs  .  Financial resource strain: Not on file  . Food insecurity    Worry: Not on file    Inability: Not on file  . Transportation needs    Medical: Not on file    Non-medical: Not on file  Tobacco Use  . Smoking status: Former Smoker    Packs/day: 0.20    Years: 4.00    Pack years: 0.80    Types: Cigarettes    Quit date: 05/13/1995    Years since quitting: 23.4  . Smokeless tobacco: Never Used  Substance and Sexual Activity  . Alcohol use: Not Currently  . Drug use: No  . Sexual activity: Not Currently  Lifestyle  . Physical activity    Days per week: Not on file    Minutes per session: Not on file  . Stress: Not on file  Relationships  . Social Herbalist on phone: Not on file    Gets together: Not on file    Attends religious service: Not on file    Active member of club or organization: Not on file    Attends meetings of clubs or organizations: Not on file    Relationship status: Not on file  . Intimate partner violence    Fear of current or ex partner: Not on file    Emotionally abused: Not on file    Physically abused: Not on file    Forced sexual activity: Not on file  Other Topics Concern  . Not on file  Social History Narrative   Widow , lives by herself   Lost a son, 3 living    No children in [place], Garceno lives in [place], he visits and helps w/ shopping    Lost husband      Allergies as of 10/28/2018   No Known Allergies     Medication List       Accurate as of October 28, 2018 11:02 AM. If you have any questions, ask your nurse or doctor.        Accu-Chek Aviva device Use as instructed to check blood sugar twice daily dx E11.9   Accu-Chek Aviva Plus test strip Generic drug: glucose blood TEST BLOOD SUGAR TWICE DAILY AND LANCETS TWICE DAILY E11.9   Accu-Chek Softclix Lancets lancets USE TO CHECK BLOOD SUGAR TWICE A DAY   albuterol 108 (90 Base) MCG/ACT inhaler Commonly known as: VENTOLIN HFA Inhale 2 puffs into the lungs every 4 (four)  hours as needed for wheezing.   amLODipine 10 MG tablet Commonly known as: NORVASC Take 1 tablet (10 mg total) by mouth daily.   aspirin EC 81 MG tablet Take 1 tablet (81 mg total) by mouth daily.   colchicine 0.6 MG tablet Take 1 tablet (0.6 mg total) by mouth daily.   escitalopram 20 MG tablet Commonly known as: Lexapro Take 1 tablet (20 mg total) by mouth daily.   Fluticasone-Salmeterol 250-50 MCG/DOSE Aepb Commonly known as: ADVAIR Inhale 2 puffs into the lungs daily.   latanoprost 0.005 % ophthalmic solution Commonly known as: XALATAN Place 1 drop into both eyes at  bedtime.   levothyroxine 75 MCG tablet Commonly known as: SYNTHROID Take 1 tablet (75 mcg total) by mouth daily before breakfast.   metoprolol tartrate 25 MG tablet Commonly known as: LOPRESSOR TAKE 2 TABLETS BY MOUTH TWICE DAILY   OVER THE COUNTER MEDICATION CPAP: At bedtime   OXYGEN Inhale 2 L into the lungs continuous.   pantoprazole 40 MG tablet Commonly known as: PROTONIX Take 1 tablet (40 mg total) by mouth daily.   potassium chloride SA 20 MEQ tablet Commonly known as: K-DUR Take 1 tablet (20 mEq total) by mouth 2 (two) times daily.   rosuvastatin 40 MG tablet Commonly known as: CRESTOR Take 1 tablet (40 mg total) by mouth every evening.           Objective:   Physical Exam There were no vitals taken for this visit. This is a phone travel visit.  Alert oriented x3, no apparent distress, speaking complete sentences    Assessment    Assessment DM per endocrinology HTN Hyperlipidemia Depression, insomnia (citalopram caused headache ? 05-2014) MSK: --DJD, saw ortho ~06-2014 --Multiple joint pains, 2008, sedimentation rate, RA >> negative --Back pain>> -- MRI 2007: Epidural lipomatosis, congenitally short pedicles, spondylosis, and disc bulges and protrusions contribute to central, foraminal, and subarticular lateral recess stenoses as detailed above.  --2008, saw Ortho, Rx  local injections  --local injections back Dr Mina Marble  ~ 12-2015 GI --GERD --Persisting nausea, vomiting, weight loss: Extensive work-up ~ 12/2017. --Colonoscopy due 2023 Pulmonary -Chronic respiratory failure  -asthma -OSA dx 2010, severe, nocturnal desaturation, intolerant to CPAP, on nocturnal O2 -ONO 01/2017:  + Hypoxia at night, rec  to continue oxygen at night unless able  to tolerate a CPAP   DOE, chronic CP, chronic RUQ pain:: 2004--Cath neg, areterial LE dopplers (-) 06-2003-- abd. u/s fatty liver 01-2005-- CP...neg u/s GB, neg HIDA 10-06--saw cards--cardiolite neg 12-2007 abnormal stress test Cath 02-01-08: normal coronaries  chest pain, stress test echo okay 09/2016 SOB, admitted , cath 10/2017: no CAD H/o Morbid obesity   PLAN:  DM: Per endocrinology, reports that ambulatory CBGs are around 107, she checks twice a day.  Lancets refill for her. HTN: Reports good medication compliance, she is on amlodipine 10 mg, metoprolol, potassium supplements. Persistent nausea, GERD, history of H. pylori infection: Again symptoms are mild, at baseline, on PPIs. Depression: Better, on Lexapro Pericarditis, chronic chest pain: See note from cardiology, they strongly recommend rheumatology referral, they plan to refill colchicine for 2 additional months. Rheumatology referral This has failed before, patient reports that she now has transportation.  She needs to be evaluated by rheumatology for a number of issues: -pericarditis -interstitial lung disease per CT 05/01/2018. -Pulmonary was also concerned about drug-induced lupus and other collagen vascular diseases. 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 Note: 2 local rheumatologist declined to see the patient as she [facility] multiple times.  We will try a different rheumatologist Chronic  respiratory failure: Refill albuterol and Advair. RTC to this office 1 month       I discussed the assessment and treatment plan with the patient. The patient was provided an opportunity to ask questions and all were answered. The patient agreed with the plan and demonstrated an understanding of the instructions.   The patient was advised to call back or seek an in-person evaluation if the symptoms worsen or if the condition fails  to improve as anticipated.

## 2018-10-29 DIAGNOSIS — L602 Onychogryphosis: Secondary | ICD-10-CM | POA: Diagnosis not present

## 2018-10-29 DIAGNOSIS — E1351 Other specified diabetes mellitus with diabetic peripheral angiopathy without gangrene: Secondary | ICD-10-CM | POA: Diagnosis not present

## 2018-10-29 DIAGNOSIS — L84 Corns and callosities: Secondary | ICD-10-CM | POA: Diagnosis not present

## 2018-10-29 NOTE — Assessment & Plan Note (Signed)
DM: Per endocrinology, reports that ambulatory CBGs are around 107, she checks twice a day.  Lancets refill for her. HTN: Reports good medication compliance, she is on amlodipine 10 mg, metoprolol, potassium supplements. Persistent nausea, GERD, history of H. pylori infection: Again symptoms are mild, at baseline, on PPIs. Depression: Better, on Lexapro Pericarditis, chronic chest pain: See note from cardiology, they strongly recommend rheumatology referral, they plan to refill colchicine for 2 additional months. Rheumatology referral This has failed before, patient reports that she now has transportation.  She needs to be evaluated by rheumatology for a number of issues: -pericarditis -interstitial lung disease per CT 05/01/2018. -Pulmonary was also concerned about drug-induced lupus and other collagen vascular diseases. 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 Note: 2 local rheumatologist declined to see the patient as she [facility] multiple times.  We will try a different rheumatologist Chronic respiratory failure: Refill albuterol and Advair. RTC to this office 1 month

## 2018-11-01 ENCOUNTER — Other Ambulatory Visit: Payer: Self-pay

## 2018-11-01 ENCOUNTER — Ambulatory Visit (HOSPITAL_COMMUNITY)
Admission: RE | Admit: 2018-11-01 | Discharge: 2018-11-01 | Disposition: A | Discharge status: Home / Self Care | Payer: Medicare Other | Source: Ambulatory Visit | Attending: Cardiology | Admitting: Cardiology

## 2018-11-01 DIAGNOSIS — I3139 Other pericardial effusion (noninflammatory): Secondary | ICD-10-CM

## 2018-11-01 DIAGNOSIS — I313 Pericardial effusion (noninflammatory): Secondary | ICD-10-CM | POA: Diagnosis not present

## 2018-11-01 NOTE — Progress Notes (Signed)
*  PRELIMINARY RESULTS* Echocardiogram 2D Echocardiogram has been performed.  [name] 11/01/2018, 11:29 AM

## 2018-11-02 ENCOUNTER — Ambulatory Visit: Payer: Self-pay | Admitting: Pharmacist

## 2018-11-03 NOTE — Progress Notes (Signed)
This encounter was created in error - please disregard.

## 2018-11-03 NOTE — Telephone Encounter (Signed)
Patient says Dr. Valora Piccolo office said they couldn't see her. She says she needs a new referral to a different rhuematologist preferably in [place].  Patient says she has EKG or echo and has not heard back.  Patient would like a call back to discuss.

## 2018-11-03 NOTE — Telephone Encounter (Signed)
Spoke w/ Pt- informed that we have sent referral to Dr. Earnest Conroy in [facility]- she is to let us know in several days if they don't hear from them. Cardiology should call regarding ECHO results.

## 2018-11-04 ENCOUNTER — Other Ambulatory Visit: Payer: Self-pay | Admitting: Pharmacist

## 2018-11-04 NOTE — Patient Outreach (Signed)
[facility]) Care Management [place]  11/04/2018  [name] 04-28-1951 [id number]  Reason for referral: medication reconciliation  [facility] pharmacy case is being closed due to the following reasons:  Goals have been met.  Medications have been reviewed.  Patient has Advair and albuterol in her possession that was delivered by [employer].  She will continue to have medications delivered.  No other concerns or needs identified at this time.  Patient has been provided [facility] CM contact information if assistance needed in the future.    Thank you for allowing [facility] pharmacy to be involved in this patient's care.    Regina Eck, PharmD, [place]  [phone]

## 2018-11-05 ENCOUNTER — Telehealth: Payer: Self-pay

## 2018-11-05 DIAGNOSIS — I10 Essential (primary) hypertension: Secondary | ICD-10-CM

## 2018-11-05 DIAGNOSIS — R079 Chest pain, unspecified: Secondary | ICD-10-CM

## 2018-11-05 NOTE — Telephone Encounter (Signed)
-----   Message from Sueanne Margarita, MD sent at 11/02/2018  5:13 PM EDT ----- Please get Cardiac MRI with gad to reassess pericardium due to ongoing CP with no PE on echo

## 2018-11-05 NOTE — Telephone Encounter (Signed)
Copied from [place] [phone]. Topic: General - Other >> Nov 05, 2018 12:12 PM Leward Quan A wrote: Reason for CRM: Patient called to say that she is waiting to hear from a Rheumatologist and it should be in [facility] because she can not see one in [place]. Per patient she is very frustrated because she feel as if she is getting the runaround and no one is calling her back with any information. Please advise Thank you Ph# [phone]

## 2018-11-05 NOTE — Telephone Encounter (Signed)
I spoke w/ Pt on 10/27/2018 (see telephone note). Gwen- can you check status of rheumatology referral to Dr. Earnest Conroy in [facility] please?

## 2018-11-05 NOTE — Telephone Encounter (Signed)
Spoke with the patient, she expressed understanding about having a Cardiac MRI. She will have a BMET at [facility] before her MRI.

## 2018-11-09 NOTE — Telephone Encounter (Signed)
Noted! Thank you

## 2018-11-09 NOTE — Telephone Encounter (Signed)
Spoke with office, stated they did not receive the referral that was sent on 6/19 I sent the referral again today, office will have nurse look at referral and schedule appt

## 2018-11-10 DIAGNOSIS — R0902 Hypoxemia: Secondary | ICD-10-CM | POA: Diagnosis not present

## 2018-11-10 DIAGNOSIS — G4733 Obstructive sleep apnea (adult) (pediatric): Secondary | ICD-10-CM | POA: Diagnosis not present

## 2018-11-16 DIAGNOSIS — R079 Chest pain, unspecified: Secondary | ICD-10-CM | POA: Diagnosis not present

## 2018-11-16 DIAGNOSIS — I10 Essential (primary) hypertension: Secondary | ICD-10-CM | POA: Diagnosis not present

## 2018-11-17 LAB — BASIC METABOLIC PANEL
BUN/Creatinine Ratio: 13 (ref 12–28)
BUN: 10 mg/dL (ref 8–27)
CO2: 27 mmol/L (ref 20–29)
Calcium: 9.2 mg/dL (ref 8.7–10.3)
Chloride: 99 mmol/L (ref 96–106)
Creatinine, Ser: 0.78 mg/dL (ref 0.57–1.00)
GFR calc Af Amer: 90 mL/min/{1.73_m2} (ref >59)
GFR calc non Af Amer: 78 mL/min/{1.73_m2} (ref >59)
Glucose: 90 mg/dL (ref 65–99)
Potassium: 3.5 mmol/L (ref 3.5–5.2)
Sodium: 141 mmol/L (ref 134–144)

## 2018-11-18 DIAGNOSIS — I2510 Atherosclerotic heart disease of native coronary artery without angina pectoris: Secondary | ICD-10-CM | POA: Diagnosis not present

## 2018-11-18 DIAGNOSIS — I509 Heart failure, unspecified: Secondary | ICD-10-CM | POA: Diagnosis not present

## 2018-11-19 ENCOUNTER — Encounter: Payer: Self-pay | Admitting: *Deleted

## 2018-11-19 ENCOUNTER — Ambulatory Visit: Payer: Medicare Other | Admitting: Cardiology

## 2018-11-19 ENCOUNTER — Telehealth: Payer: Self-pay | Admitting: *Deleted

## 2018-11-19 NOTE — Telephone Encounter (Signed)
Letter mailed to patient regarding Cardiac MRI scheduled for 12/10/18 at 9:00 am at [facility].

## 2018-11-26 ENCOUNTER — Other Ambulatory Visit: Payer: Self-pay

## 2018-11-26 NOTE — Patient Outreach (Signed)
[facility]) Care Management  11/26/2018   TEAGAN OZAWA 1950/12/20 [id number]  Subjective: Successful outreach to the patient.  Two patient identifiers obtained.  The patient states that she is doing fair.  She states that she is still having pain in her arms and legs.  She rates the pain at 9/10. She states that she takes medication for the pain.  She denies any cough or wheezing but states that she has shortness of breath.  She states that she is using her inhalers but does not like to use the nebulizer and continuously wear her oxygen because it causes her to have a cold. Advised the patient that wearing her oxygen will help with the shortness of breath and to inform her physician and see if she can have a humidifier added to her oxygen to see if it will help.  She states that she talks to her physician about all of her problems.  She states that she takes all the rest of her medications. She states that she has problems with reflux and her Protonix helps.  She states she has 2-3 appointments next month and I advised her to try to keep the all.  She states she is trying to get a doctor closer to her home.    Current Medications:  Current Outpatient Medications  Medication Sig Dispense Refill  . Accu-Chek Softclix Lancets lancets USE TO CHECK BLOOD SUGAR TWICE A DAY 200 each 12  . albuterol (VENTOLIN HFA) 108 (90 Base) MCG/ACT inhaler Inhale 2 puffs into the lungs every 4 (four) hours as needed for wheezing or shortness of breath. 18 g 5  . amLODipine (NORVASC) 10 MG tablet Take 1 tablet (10 mg total) by mouth daily. 90 tablet 3  . aspirin EC 81 MG tablet Take 1 tablet (81 mg total) by mouth daily. 90 tablet 3  . Blood Glucose Monitoring Suppl (ACCU-CHEK AVIVA) device Use as instructed to check blood sugar twice daily dx E11.9 1 each 0  . colchicine 0.6 MG tablet Take 1 tablet (0.6 mg total) by mouth daily. 30 tablet 1  . escitalopram (LEXAPRO) 20 MG tablet Take 1 tablet  (20 mg total) by mouth daily. 90 tablet 1  . Fluticasone-Salmeterol (ADVAIR) 250-50 MCG/DOSE AEPB Inhale 2 puffs into the lungs daily. 60 each 5  . glucose blood (ACCU-CHEK AVIVA PLUS) test strip TEST BLOOD SUGAR TWICE DAILY AND LANCETS TWICE DAILY E11.9 200 each 11  . latanoprost (XALATAN) 0.005 % ophthalmic solution Place 1 drop into both eyes at bedtime.    Marland Kitchen levothyroxine (SYNTHROID, LEVOTHROID) 75 MCG tablet Take 1 tablet (75 mcg total) by mouth daily before breakfast. 90 tablet 1  . metoprolol tartrate (LOPRESSOR) 25 MG tablet TAKE 2 TABLETS BY MOUTH TWICE DAILY 360 tablet 1  . OVER THE COUNTER MEDICATION CPAP: At bedtime    . OXYGEN Inhale 2 L into the lungs continuous.     . pantoprazole (PROTONIX) 40 MG tablet Take 1 tablet (40 mg total) by mouth daily. 90 tablet 3  . potassium chloride SA (K-DUR,KLOR-CON) 20 MEQ tablet Take 1 tablet (20 mEq total) by mouth 2 (two) times daily. 180 tablet 1  . rosuvastatin (CRESTOR) 40 MG tablet Take 1 tablet (40 mg total) by mouth every evening. 90 tablet 1   No current facility-administered medications for this visit.     Functional Status:  In your present state of health, do you have any difficulty performing the following activities: 04/22/2018 04/01/2018  Hearing? N  N  Vision? N N  Difficulty concentrating or making decisions? [name]  Walking or climbing stairs? Y Y  Dressing or bathing? Y Y  Doing errands, shopping? [name]  Preparing Food and eating ? - Y  Using the Toilet? - Y  Comment - has BSC  In the past six months, have you accidently leaked urine? - Y  Comment - wears pull ups  Do you have problems with loss of bowel control? - Y  Managing your Medications? - Y  Managing your Finances? - Y  Housekeeping or managing your Housekeeping? - Y  Comment - -  Some recent data might be hidden    Fall/Depression Screening: Fall Risk  11/26/2018 11/26/2018 10/27/2018  Falls in the past year? 0 0 0  Number falls in past yr: - - -  Injury  with Fall? - - -  Comment - - -  Risk Factor Category  - - -  Risk for fall due to : - - -  Risk for fall due to: Comment - - -  Follow up - - -   PHQ 2/9 Scores 04/22/2018 04/01/2018 02/10/2018 10/26/2017 08/06/2017 03/31/2017 03/26/2016  PHQ - 2 Score 3 0 3 3 1 2  0  PHQ- 9 Score 10 - 13 13 - 5 -  Exception Documentation - - - - - - -  Not completed - - - - - - -    Assessment: Patient will continue to benefit from health coach outreach for disease management and support. THN CM Care Plan Problem One     Most Recent Value  THN Long Term Goal   In 60 days the patient will verbalize no readmissions for COPD exacerbation  THN Long Term Goal Start Date  11/26/18  Interventions for Problem One Long Term Goal  Discussed signs and symptoms of copd, encouraged the patient to wear her oxygen as prescribed. encouraged medication adherence, encouraged her to follow up with all of her appointments     Plan: [place] will contact patient in the month of September and patient agrees to next outreach.   Lazaro Arms RN, BSN, [place] Direct Dial:  [phone]  Fax: [phone]

## 2018-11-30 ENCOUNTER — Other Ambulatory Visit: Payer: Self-pay | Admitting: Internal Medicine

## 2018-11-30 MED ORDER — POTASSIUM CHLORIDE CRYS ER 20 MEQ PO TBCR: 20.0000 meq | EXTENDED_RELEASE_TABLET | Freq: Two times a day (BID) | ORAL | 1 refills | Status: DC

## 2018-11-30 MED ORDER — LEVOTHYROXINE SODIUM 75 MCG PO TABS: 75.0000 ug | ORAL_TABLET | Freq: Every day | ORAL | 1 refills | Status: DC

## 2018-11-30 NOTE — Telephone Encounter (Signed)
Rxs sent

## 2018-11-30 NOTE — Telephone Encounter (Signed)
Copied from [place] [id number]. Topic: Quick Communication - Rx Refill/Question >> Nov 30, 2018  1:49 PM Leward Quan A wrote: Medication: potassium chloride SA (K-DUR,KLOR-CON) 20 MEQ tablet, levothyroxine (SYNTHROID, LEVOTHROID) 75 MCG tablet   Has the patient contacted their pharmacy? Yes.   (Agent: If no, request that the patient contact the pharmacy for the refill.) (Agent: If yes, when and what did the pharmacy advise?)  Preferred Pharmacy (with phone number or street name): [place], [address] [phone] (Phone) [phone] (Fax)    Agent: Please be advised that RX refills may take up to 3 business days. We ask that you follow-up with your pharmacy.

## 2018-12-08 ENCOUNTER — Telehealth: Payer: Self-pay

## 2018-12-08 ENCOUNTER — Telehealth (HOSPITAL_COMMUNITY): Payer: Self-pay | Admitting: Emergency Medicine

## 2018-12-08 NOTE — Telephone Encounter (Signed)
Reaching out to patient to offer assistance regarding upcoming cardiac imaging study; pt verbalizes understanding of appt date/time, parking situation and where to check in,  and verified current allergies; name and call back number provided for further questions should they arise Zana Biancardi RN Navigator Cardiac Imaging [facility] [phone] office [phone] cell  Pt denies covid symptoms, verbalized understanding of visitor policy. 

## 2018-12-08 NOTE — Telephone Encounter (Signed)
Copied from [place] [phone]. Topic: General - Other >> Dec 08, 2018  2:42 PM Keene Breath wrote: Reason for CRM: Patient called to ask the nurse or the doctor to call her regarding her transportation to the doctor.  Patient stated that the office has to take care of some paperwork in order for her to get transportation to her appt.  Please advise and call patient back at [phone]

## 2018-12-09 ENCOUNTER — Encounter (HOSPITAL_COMMUNITY): Payer: Self-pay

## 2018-12-09 ENCOUNTER — Emergency Department (HOSPITAL_COMMUNITY): Payer: Medicare Other

## 2018-12-09 ENCOUNTER — Other Ambulatory Visit: Payer: Self-pay

## 2018-12-09 ENCOUNTER — Inpatient Hospital Stay (HOSPITAL_COMMUNITY)
Admission: EM | Admit: 2018-12-09 | Discharge: 2018-12-11 | DRG: 446 | Disposition: A | Discharge status: Home / Self Care | Payer: Medicare Other | Attending: Internal Medicine | Admitting: Internal Medicine

## 2018-12-09 DIAGNOSIS — K810 Acute cholecystitis: Secondary | ICD-10-CM | POA: Diagnosis not present

## 2018-12-09 DIAGNOSIS — Z20828 Contact with and (suspected) exposure to other viral communicable diseases: Secondary | ICD-10-CM | POA: Diagnosis present

## 2018-12-09 DIAGNOSIS — I10 Essential (primary) hypertension: Secondary | ICD-10-CM | POA: Diagnosis not present

## 2018-12-09 DIAGNOSIS — R634 Abnormal weight loss: Secondary | ICD-10-CM | POA: Diagnosis present

## 2018-12-09 DIAGNOSIS — K219 Gastro-esophageal reflux disease without esophagitis: Secondary | ICD-10-CM | POA: Diagnosis present

## 2018-12-09 DIAGNOSIS — E039 Hypothyroidism, unspecified: Secondary | ICD-10-CM | POA: Diagnosis present

## 2018-12-09 DIAGNOSIS — Z79890 Hormone replacement therapy (postmenopausal): Secondary | ICD-10-CM

## 2018-12-09 DIAGNOSIS — R109 Unspecified abdominal pain: Secondary | ICD-10-CM | POA: Diagnosis not present

## 2018-12-09 DIAGNOSIS — R1010 Upper abdominal pain, unspecified: Secondary | ICD-10-CM

## 2018-12-09 DIAGNOSIS — Z87891 Personal history of nicotine dependence: Secondary | ICD-10-CM

## 2018-12-09 DIAGNOSIS — F329 Major depressive disorder, single episode, unspecified: Secondary | ICD-10-CM | POA: Diagnosis present

## 2018-12-09 DIAGNOSIS — R112 Nausea with vomiting, unspecified: Secondary | ICD-10-CM

## 2018-12-09 DIAGNOSIS — J449 Chronic obstructive pulmonary disease, unspecified: Secondary | ICD-10-CM | POA: Diagnosis not present

## 2018-12-09 DIAGNOSIS — Z7982 Long term (current) use of aspirin: Secondary | ICD-10-CM

## 2018-12-09 DIAGNOSIS — Z96653 Presence of artificial knee joint, bilateral: Secondary | ICD-10-CM | POA: Diagnosis not present

## 2018-12-09 DIAGNOSIS — Z8249 Family history of ischemic heart disease and other diseases of the circulatory system: Secondary | ICD-10-CM

## 2018-12-09 DIAGNOSIS — Z825 Family history of asthma and other chronic lower respiratory diseases: Secondary | ICD-10-CM | POA: Diagnosis not present

## 2018-12-09 DIAGNOSIS — Z823 Family history of stroke: Secondary | ICD-10-CM

## 2018-12-09 DIAGNOSIS — K7460 Unspecified cirrhosis of liver: Secondary | ICD-10-CM | POA: Diagnosis present

## 2018-12-09 DIAGNOSIS — Z833 Family history of diabetes mellitus: Secondary | ICD-10-CM | POA: Diagnosis not present

## 2018-12-09 DIAGNOSIS — Z6821 Body mass index (BMI) 21.0-21.9, adult: Secondary | ICD-10-CM

## 2018-12-09 DIAGNOSIS — E119 Type 2 diabetes mellitus without complications: Secondary | ICD-10-CM | POA: Diagnosis present

## 2018-12-09 DIAGNOSIS — Z7951 Long term (current) use of inhaled steroids: Secondary | ICD-10-CM | POA: Diagnosis not present

## 2018-12-09 DIAGNOSIS — K8044 Calculus of bile duct with chronic cholecystitis without obstruction: Principal | ICD-10-CM | POA: Diagnosis present

## 2018-12-09 DIAGNOSIS — E785 Hyperlipidemia, unspecified: Secondary | ICD-10-CM | POA: Diagnosis present

## 2018-12-09 DIAGNOSIS — Z9981 Dependence on supplemental oxygen: Secondary | ICD-10-CM

## 2018-12-09 DIAGNOSIS — G4733 Obstructive sleep apnea (adult) (pediatric): Secondary | ICD-10-CM | POA: Diagnosis not present

## 2018-12-09 DIAGNOSIS — R7401 Elevation of levels of liver transaminase levels: Secondary | ICD-10-CM

## 2018-12-09 DIAGNOSIS — K8020 Calculus of gallbladder without cholecystitis without obstruction: Secondary | ICD-10-CM | POA: Diagnosis not present

## 2018-12-09 DIAGNOSIS — R740 Nonspecific elevation of levels of transaminase and lactic acid dehydrogenase [LDH]: Secondary | ICD-10-CM | POA: Diagnosis not present

## 2018-12-09 DIAGNOSIS — Z03818 Encounter for observation for suspected exposure to other biological agents ruled out: Secondary | ICD-10-CM | POA: Diagnosis not present

## 2018-12-09 DIAGNOSIS — R0902 Hypoxemia: Secondary | ICD-10-CM | POA: Diagnosis not present

## 2018-12-09 LAB — CBC WITH DIFFERENTIAL/PLATELET
Abs Immature Granulocytes: 0.01 10*3/uL (ref 0.00–0.07)
Basophils Absolute: 0 10*3/uL (ref 0.0–0.1)
Basophils Relative: 0 %
Eosinophils Absolute: 0 10*3/uL (ref 0.0–0.5)
Eosinophils Relative: 0 %
HCT: 37 % (ref 36.0–46.0)
Hemoglobin: 11.8 g/dL — ABNORMAL LOW (ref 12.0–15.0)
Immature Granulocytes: 0 %
Lymphocytes Relative: 10 %
Lymphs Abs: 0.5 10*3/uL — ABNORMAL LOW (ref 0.7–4.0)
MCH: 26.6 pg (ref 26.0–34.0)
MCHC: 31.9 g/dL (ref 30.0–36.0)
MCV: 83.5 fL (ref 80.0–100.0)
Monocytes Absolute: 0.3 10*3/uL (ref 0.1–1.0)
Monocytes Relative: 6 %
Neutro Abs: 4.3 10*3/uL (ref 1.7–7.7)
Neutrophils Relative %: 84 %
Platelets: 285 10*3/uL (ref 150–400)
RBC: 4.43 MIL/uL (ref 3.87–5.11)
RDW: 16.7 % — ABNORMAL HIGH (ref 11.5–15.5)
WBC: 5.1 10*3/uL (ref 4.0–10.5)
nRBC: 0 % (ref 0.0–0.2)

## 2018-12-09 LAB — COMPREHENSIVE METABOLIC PANEL
ALT: 194 U/L — ABNORMAL HIGH (ref 0–44)
AST: 846 U/L — ABNORMAL HIGH (ref 15–41)
Albumin: 3.9 g/dL (ref 3.5–5.0)
Alkaline Phosphatase: 362 U/L — ABNORMAL HIGH (ref 38–126)
Anion gap: 10 (ref 5–15)
BUN: 12 mg/dL (ref 8–23)
CO2: 24 mmol/L (ref 22–32)
Calcium: 9 mg/dL (ref 8.9–10.3)
Chloride: 103 mmol/L (ref 98–111)
Creatinine, Ser: 0.68 mg/dL (ref 0.44–1.00)
GFR calc Af Amer: >60 mL/min (ref >60)
GFR calc non Af Amer: >60 mL/min (ref >60)
Glucose, Bld: 89 mg/dL (ref 70–99)
Potassium: 3.8 mmol/L (ref 3.5–5.1)
Sodium: 137 mmol/L (ref 135–145)
Total Bilirubin: 1.2 mg/dL (ref 0.3–1.2)
Total Protein: 8.9 g/dL — ABNORMAL HIGH (ref 6.5–8.1)

## 2018-12-09 LAB — HEMOGLOBIN A1C
Hgb A1c MFr Bld: 5.5 % (ref 4.8–5.6)
Mean Plasma Glucose: 111.15 mg/dL

## 2018-12-09 LAB — URINALYSIS, ROUTINE W REFLEX MICROSCOPIC
Bilirubin Urine: NEGATIVE
Glucose, UA: NEGATIVE mg/dL
Hgb urine dipstick: NEGATIVE
Ketones, ur: NEGATIVE mg/dL
Nitrite: NEGATIVE
Protein, ur: NEGATIVE mg/dL
Specific Gravity, Urine: 1.009 (ref 1.005–1.030)
pH: 6 (ref 5.0–8.0)

## 2018-12-09 LAB — GLUCOSE, CAPILLARY: Glucose-Capillary: 119 mg/dL — ABNORMAL HIGH (ref 70–99)

## 2018-12-09 LAB — CBG MONITORING, ED
Glucose-Capillary: 43 mg/dL — CL (ref 70–99)
Glucose-Capillary: 54 mg/dL — ABNORMAL LOW (ref 70–99)
Glucose-Capillary: 167 mg/dL — ABNORMAL HIGH (ref 70–99)

## 2018-12-09 LAB — LIPASE, BLOOD: Lipase: 45 U/L (ref 11–51)

## 2018-12-09 LAB — SARS CORONAVIRUS 2 BY RT PCR (HOSPITAL ORDER, PERFORMED IN ~~LOC~~ HOSPITAL LAB): SARS Coronavirus 2: NEGATIVE

## 2018-12-09 MED ORDER — HYDROMORPHONE HCL 1 MG/ML IJ SOLN: 0.5000 mg | INTRAMUSCULAR | Status: DC | PRN

## 2018-12-09 MED ORDER — ESCITALOPRAM OXALATE 10 MG PO TABS
20.0000 mg | ORAL_TABLET | Freq: Every day | ORAL | Status: DC
  Administered 2018-12-09 – 2018-12-11 (×3): 20 mg via ORAL
  Filled 2018-12-09 (×3): qty 2

## 2018-12-09 MED ORDER — LACTATED RINGERS IV SOLN
INTRAVENOUS | Status: DC
  Administered 2018-12-09 – 2018-12-11 (×4): via INTRAVENOUS

## 2018-12-09 MED ORDER — ONDANSETRON HCL 4 MG/2ML IJ SOLN
4.0000 mg | Freq: Four times a day (QID) | INTRAMUSCULAR | Status: DC | PRN
  Administered 2018-12-09: 4 mg via INTRAVENOUS
  Filled 2018-12-09: qty 2

## 2018-12-09 MED ORDER — PIPERACILLIN-TAZOBACTAM 3.375 G IVPB
3.3750 g | Freq: Three times a day (TID) | INTRAVENOUS | Status: DC
  Administered 2018-12-09 – 2018-12-10 (×2): 3.375 g via INTRAVENOUS
  Filled 2018-12-09 (×2): qty 50

## 2018-12-09 MED ORDER — ENOXAPARIN SODIUM 40 MG/0.4ML ~~LOC~~ SOLN
40.0000 mg | SUBCUTANEOUS | Status: DC
  Administered 2018-12-09 – 2018-12-10 (×2): 40 mg via SUBCUTANEOUS
  Filled 2018-12-09 (×2): qty 0.4

## 2018-12-09 MED ORDER — ONDANSETRON HCL 4 MG/2ML IJ SOLN
4.0000 mg | Freq: Once | INTRAMUSCULAR | Status: DC
  Filled 2018-12-09: qty 2

## 2018-12-09 MED ORDER — MORPHINE SULFATE (PF) 4 MG/ML IV SOLN
4.0000 mg | Freq: Once | INTRAVENOUS | Status: DC
  Filled 2018-12-09: qty 1

## 2018-12-09 MED ORDER — AMLODIPINE BESYLATE 5 MG PO TABS
10.0000 mg | ORAL_TABLET | Freq: Every day | ORAL | Status: DC
  Administered 2018-12-09 – 2018-12-11 (×3): 10 mg via ORAL
  Filled 2018-12-09 (×4): qty 2

## 2018-12-09 MED ORDER — PIPERACILLIN-TAZOBACTAM 3.375 G IVPB 30 MIN
3.3750 g | Freq: Once | INTRAVENOUS | Status: AC
  Administered 2018-12-09: 3.375 g via INTRAVENOUS

## 2018-12-09 MED ORDER — ALUM & MAG HYDROXIDE-SIMETH 200-200-20 MG/5ML PO SUSP
30.0000 mL | ORAL | Status: DC | PRN
  Administered 2018-12-09: 30 mL via ORAL
  Filled 2018-12-09: qty 30

## 2018-12-09 MED ORDER — LATANOPROST 0.005 % OP SOLN
1.0000 [drp] | Freq: Every day | OPHTHALMIC | Status: DC
  Administered 2018-12-10: 22:00:00 1 [drp] via OPHTHALMIC
  Filled 2018-12-09: qty 2.5

## 2018-12-09 MED ORDER — MORPHINE SULFATE (PF) 4 MG/ML IV SOLN
4.0000 mg | Freq: Once | INTRAVENOUS | Status: AC
  Administered 2018-12-09: 4 mg via INTRAMUSCULAR

## 2018-12-09 MED ORDER — INSULIN ASPART 100 UNIT/ML ~~LOC~~ SOLN: 0.0000 [IU] | Freq: Three times a day (TID) | SUBCUTANEOUS | Status: DC

## 2018-12-09 MED ORDER — PANTOPRAZOLE SODIUM 40 MG PO TBEC
40.0000 mg | DELAYED_RELEASE_TABLET | Freq: Every day | ORAL | Status: DC
  Administered 2018-12-09 – 2018-12-10 (×2): 40 mg via ORAL
  Filled 2018-12-09 (×2): qty 1

## 2018-12-09 MED ORDER — DEXTROSE 50 % IV SOLN
INTRAVENOUS | Status: AC
  Administered 2018-12-09: 50 mL
  Filled 2018-12-09: qty 50

## 2018-12-09 MED ORDER — INSULIN ASPART 100 UNIT/ML ~~LOC~~ SOLN: 0.0000 [IU] | Freq: Every day | SUBCUTANEOUS | Status: DC

## 2018-12-09 MED ORDER — PIPERACILLIN-TAZOBACTAM 3.375 G IVPB
3.3750 g | Freq: Once | INTRAVENOUS | Status: DC
  Filled 2018-12-09: qty 50

## 2018-12-09 MED ORDER — ONDANSETRON HCL 4 MG PO TABS: 4.0000 mg | ORAL_TABLET | Freq: Four times a day (QID) | ORAL | Status: DC | PRN

## 2018-12-09 MED ORDER — MOMETASONE FURO-FORMOTEROL FUM 200-5 MCG/ACT IN AERO
2.0000 | INHALATION_SPRAY | Freq: Two times a day (BID) | RESPIRATORY_TRACT | Status: DC
  Administered 2018-12-09 – 2018-12-11 (×3): 2 via RESPIRATORY_TRACT
  Filled 2018-12-09: qty 8.8

## 2018-12-09 MED ORDER — METOPROLOL TARTRATE 50 MG PO TABS
50.0000 mg | ORAL_TABLET | Freq: Two times a day (BID) | ORAL | Status: DC
  Administered 2018-12-09 – 2018-12-11 (×4): 50 mg via ORAL
  Filled 2018-12-09 (×4): qty 1

## 2018-12-09 MED ORDER — LEVOTHYROXINE SODIUM 75 MCG PO TABS
75.0000 ug | ORAL_TABLET | Freq: Every day | ORAL | Status: DC
  Administered 2018-12-11: 75 ug via ORAL
  Filled 2018-12-09 (×2): qty 1

## 2018-12-09 MED ORDER — ONDANSETRON 4 MG PO TBDP
4.0000 mg | ORAL_TABLET | Freq: Once | ORAL | Status: AC
  Administered 2018-12-09: 4 mg via ORAL
  Filled 2018-12-09: qty 1

## 2018-12-09 NOTE — H&P (Signed)
History and Physical    [name] VEL:[id number] DOB: Jul 25, 1950 DOA: 12/09/2018  PCP: Colon Branch, MD   Patient coming from: Home  Chief Complaint:   HPI: [name] is a 68 y.o. female with medical history significant for COPD, depression, diabetes, GERD, hypertension, dyslipidemia, questionable scleroderma, and recent 2D echocardiogram 6/22 with increased ventricular stiffness, who began having severe epigastric abdominal pain which started shortly after awakening this morning around 6 AM.  She states that she has been having some ongoing, intermittent nausea and vomiting over the last several months and has had overall poor oral intake.  She denies any hematemesis and does states she has some mild diarrhea.  She states that she had pain earlier on arrival to the ED 8/10, but this has now improved.  No vomiting since arrival noted.  She denies any chest pain or shortness of breath.  Patient has had a pericardial effusion noted on recent 2D echocardiogram and was due to have cardiac MRI in a.m.   ED Course: Vital signs are stable and laboratory data notable for alk phos 362, AST 846, and ALT 194.  Lipase is 45.  Bilirubin is 1.2.  CBC within normal limits aside from some mild, stable anemia.  Ultrasound of the right upper quadrant of the abdomen with noted cholelithiasis and other sign suggestive of acute cholecystitis.  General surgery has evaluated this patient with determine need for lab cholecystectomy in a.m., but would like cardiology clearance given recent pericardial effusion.  Review of Systems: All others reviewed and otherwise negative.  Past Medical History:  Diagnosis Date  . Anterolisthesis    Grade 1, L4-5  . Bronchospasm 05/28/2012  . COPD (chronic obstructive pulmonary disease) ([place])   . DEGENERATIVE JOINT DISEASE 10/06/2006  . DEPRESSION 09/26/2008  . DIABETES MELLITUS 10/06/2006  . Diverticulosis    2585,2778  . GERD (gastroesophageal reflux disease)  07/25/2013  . HYPERLIPIDEMIA 01/11/2009  . HYPERTENSION 10/06/2006  . Hypokalemia   . Hypomagnesemia   . INSOMNIA 09/26/2008  . Internal hemorrhoids   . OBSTRUCTIVE SLEEP APNEA 06/23/2008   Severe OSA per sleep study 2010, Rx a CPAP  . Pain in joint, multiple sites 11/10/2006  . Pericardial effusion   . Pericarditis   . UNSPECIFIED ANEMIA 12/10/2009  . UTI (urinary tract infection) 11/2017    Past Surgical History:  Procedure Laterality Date  . BIOPSY  12/28/2017   Procedure: BIOPSY;  Surgeon: Irving Copas., MD;  Location: [facility];  Service: Gastroenterology;;  . COLONOSCOPY  08/01/2011   Procedure: COLONOSCOPY;  Surgeon: Inda Castle, MD;  Location: [facility];  Service: Endoscopy;  Laterality: N/A;  . ESOPHAGOGASTRODUODENOSCOPY (EGD) WITH PROPOFOL N/A 12/28/2017   Procedure: ESOPHAGOGASTRODUODENOSCOPY (EGD) WITH PROPOFOL;  Surgeon: Rush Landmark Telford Nab., MD;  Location: [facility];  Service: Gastroenterology;  Laterality: N/A;  . LEFT HEART CATH AND CORONARY ANGIOGRAPHY N/A 10/22/2017   Procedure: LEFT HEART CATH AND CORONARY ANGIOGRAPHY;  Surgeon: Jettie Booze, MD;  Location: [facility];  Service: Cardiovascular;  Laterality: N/A;  . 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 Right knee replacement  2005     reports that she quit smoking about 23 years ago. Her smoking use included cigarettes. She has a 0.80 pack-year smoking history. She has never used smokeless tobacco. She reports previous alcohol use. She reports that she does not use drugs.  No Known Allergies  Family History  Problem Relation Age of Onset  . Asthma Mother   .  Stroke Mother   . Diabetes Mother   . Diabetes Other        M, B, S  . Hypertension Sister        M, S,B  . Pancreatic cancer Brother   . Colon cancer Neg Hx   . Prostate cancer Neg Hx   . Breast cancer Neg Hx   . Esophageal cancer Neg Hx   . Liver disease Neg Hx   . Rectal cancer Neg Hx   . Stomach cancer Neg Hx   .  Inflammatory bowel disease Neg Hx     Prior to Admission medications   Medication Sig Start Date End Date Taking? Authorizing Provider  Accu-Chek Softclix Lancets lancets USE TO CHECK BLOOD SUGAR TWICE A DAY 10/28/18  Yes Paz, Alda Berthold, MD  albuterol (VENTOLIN HFA) 108 (90 Base) MCG/ACT inhaler Inhale 2 puffs into the lungs every 4 (four) hours as needed for wheezing or shortness of breath. 10/28/18  Yes Paz, Alda Berthold, MD  amLODipine (NORVASC) 10 MG tablet Take 1 tablet (10 mg total) by mouth daily. 08/12/18 08/07/19 Yes Turner, Eber Hong, MD  aspirin EC 81 MG tablet Take 1 tablet (81 mg total) by mouth daily. 08/13/18  Yes Paz, Alda Berthold, MD  Blood Glucose Monitoring Suppl (ACCU-CHEK AVIVA) device Use as instructed to check blood sugar twice daily dx E11.9 02/08/18 02/08/19 Yes Renato Shin, MD  colchicine 0.6 MG tablet Take 1 tablet (0.6 mg total) by mouth daily. 10/22/18 12/21/18 Yes Turner, Eber Hong, MD  escitalopram (LEXAPRO) 20 MG tablet Take 1 tablet (20 mg total) by mouth daily. 08/13/18  Yes Paz, Alda Berthold, MD  Fluticasone-Salmeterol (ADVAIR) 250-50 MCG/DOSE AEPB Inhale 2 puffs into the lungs daily. 10/28/18  Yes Paz, Jacqulyn Bath E, MD  glucose blood (ACCU-CHEK AVIVA PLUS) test strip TEST BLOOD SUGAR TWICE DAILY AND LANCETS TWICE DAILY E11.9 10/27/18  Yes Paz, Alda Berthold, MD  latanoprost (XALATAN) 0.005 % ophthalmic solution Place 1 drop into both eyes at bedtime.   Yes [provider]  levothyroxine (SYNTHROID) 75 MCG tablet Take 1 tablet (75 mcg total) by mouth daily before breakfast. 11/30/18  Yes Paz, Alda Berthold, MD  metoprolol tartrate (LOPRESSOR) 25 MG tablet TAKE 2 TABLETS BY MOUTH TWICE DAILY Patient taking differently: Take 50 mg by mouth 2 (two) times daily. TAKE 2 TABLETS BY MOUTH TWICE DAILY 08/13/18  Yes Colon Branch, MD  OVER THE COUNTER MEDICATION CPAP: At bedtime   Yes [provider]  OXYGEN Inhale 2 L into the lungs continuous.    Yes [provider]  pantoprazole (PROTONIX) 40 MG tablet  Take 1 tablet (40 mg total) by mouth daily. 08/12/18  Yes Turner, Eber Hong, MD  potassium chloride SA (K-DUR) 20 MEQ tablet Take 1 tablet (20 mEq total) by mouth 2 (two) times daily. 11/30/18  Yes Paz, Alda Berthold, MD  rosuvastatin (CRESTOR) 40 MG tablet Take 1 tablet (40 mg total) by mouth every evening. 08/13/18  Yes Colon Branch, MD    Physical Exam: Vitals:   12/09/18 0913 12/09/18 0914  BP: (!) 152/83   Pulse: 77   Resp: 19   Temp: 97.8 F (36.6 C)   TempSrc: Oral   SpO2: 95%   Weight:  65.8 kg  Height:  '5\' 9"'$  (1.753 m)    Constitutional: NAD, calm, comfortable Vitals:   12/09/18 0913 12/09/18 0914  BP: (!) 152/83   Pulse: 77   Resp: 19   Temp: 97.8 F (36.6  C)   TempSrc: Oral   SpO2: 95%   Weight:  65.8 kg  Height:  '5\' 9"'$  (1.753 m)   Eyes: lids and conjunctivae normal ENMT: Mucous membranes are moist.  Neck: normal, supple Respiratory: clear to auscultation bilaterally. Normal respiratory effort. No accessory muscle use.  Cardiovascular: Regular rate and rhythm, no murmurs. No extremity edema. Abdomen: tenderness to palpation particularly over right upper quadrant, no distention. Bowel sounds positive.  Musculoskeletal:  No joint deformity upper and lower extremities.   Skin: no rashes, lesions, ulcers.  Psychiatric: Normal judgment and insight. Alert and oriented x 3. Normal mood.   Labs on Admission: I have personally reviewed following labs and imaging studies  CBC: Recent Labs  Lab 12/09/18 0923  WBC 5.1  NEUTROABS 4.3  HGB 11.8*  HCT 37.0  MCV 83.5  PLT 676   Basic Metabolic Panel: Recent Labs  Lab 12/09/18 0923  NA 137  K 3.8  CL 103  CO2 24  GLUCOSE 89  BUN 12  CREATININE 0.68  CALCIUM 9.0   GFR: Estimated Creatinine Clearance: 68.9 mL/min (by C-G formula based on SCr of 0.68 mg/dL). Liver Function Tests: Recent Labs  Lab 12/09/18 0923  AST 846*  ALT 194*  ALKPHOS 362*  BILITOT 1.2  PROT 8.9*  ALBUMIN 3.9   Recent Labs  Lab 12/09/18  0923  LIPASE 45   No results for input(s): AMMONIA in the last 168 hours. Coagulation Profile: No results for input(s): INR, PROTIME in the last 168 hours. Cardiac Enzymes: No results for input(s): CKTOTAL, CKMB, CKMBINDEX, TROPONINI in the last 168 hours. BNP (last 3 results) Recent Labs    03/04/18 1351  PROBNP 1,747*   HbA1C: No results for input(s): HGBA1C in the last 72 hours. CBG: No results for input(s): GLUCAP in the last 168 hours. Lipid Profile: No results for input(s): CHOL, HDL, LDLCALC, TRIG, CHOLHDL, LDLDIRECT in the last 72 hours. Thyroid Function Tests: No results for input(s): TSH, T4TOTAL, FREET4, T3FREE, THYROIDAB in the last 72 hours. Anemia Panel: No results for input(s): VITAMINB12, FOLATE, FERRITIN, TIBC, IRON, RETICCTPCT in the last 72 hours. Urine analysis:    Component Value Date/Time   COLORURINE YELLOW 12/09/2018 0923   APPEARANCEUR CLEAR 12/09/2018 0923   LABSPEC 1.009 12/09/2018 0923   PHURINE 6.0 12/09/2018 0923   GLUCOSEU NEGATIVE 12/09/2018 0923   GLUCOSEU NEGATIVE 11/30/2013 1057   HGBUR NEGATIVE 12/09/2018 0923   BILIRUBINUR NEGATIVE 12/09/2018 0923   KETONESUR NEGATIVE 12/09/2018 0923   PROTEINUR NEGATIVE 12/09/2018 0923   UROBILINOGEN 0.2 11/30/2013 1057   NITRITE NEGATIVE 12/09/2018 0923   LEUKOCYTESUR SMALL (A) 12/09/2018 0923    Radiological Exams on Admission: US Abdomen Limited Ruq  Result Date: 12/09/2018 CLINICAL DATA:  Right upper quadrant pain with nausea and vomiting EXAM: ULTRASOUND ABDOMEN LIMITED RIGHT UPPER QUADRANT COMPARISON:  None. FINDINGS: Gallbladder: Within the gallbladder, there is an echogenic focus which moves in shadows consistent with cholelithiasis. The gallbladder wall is mildly thickened and mildly edematous. There is a slight degree of pericholecystic fluid. The patient is focally tender over the gallbladder. No sonographic Murphy sign noted by sonographer. Common bile duct: Diameter: 5 mm. No  intrahepatic or extrahepatic biliary duct dilatation. Liver: No focal lesion identified. Liver echogenicity is increased. Liver contour is mildly lobular. Portal vein is patent on color Doppler imaging with normal direction of blood flow towards the liver. Other: None. IMPRESSION: 1. Cholelithiasis. Gallbladder wall is mildly thickened and edematous with subtle pericholecystic fluid. Patient is  tender over the gallbladder. These are findings concerning for acute cholecystitis. 2. Increase in liver echogenicity, a finding felt to represent a degree of hepatic steatosis. No focal liver lesions evident. Liver contour mildly lobular. There may be a degree of underlying hepatic cirrhosis. Appropriate laboratory assessment in this regard advised. Electronically Signed   By: Lowella Grip III M.D.   On: 12/09/2018 13:12    Assessment/Plan Active Problems:   Acute cholecystitis    Acute cholecystitis with associated transaminitis -Plans for laparoscopic cholecystectomy in a.m. per general surgery -Clear liquid diet for now and n.p.o. after midnight -Patient to remain on maintenance IV fluid -Pain management as needed for breakthrough -IV Zofran as needed for nausea or vomiting -Maintain on IV Zosyn which has been started in ED -Appreciate cardiology evaluation for cardiac risk stratification given increased stiffness of heart muscle noted on echocardiogram 6/22 with mild LVH, LVEF 55 to 60%  Type 2 diabetes-stable -Maintain on SSI  Hypothyroidism -Maintain on Synthroid  Hypertension-stable -Maintain on home medications  Dyslipidemia -Hold Crestor for now given transaminitis  GERD -PPI   DVT prophylaxis: Lovenox Code Status: Full Family Communication: None at bedside Disposition Plan:Admit for lap chole Consults called:Gen Surgery, Cardiology Admission status: Inpatient, MedSurg   Donovyn Guidice Darleen Crocker DO [facility] Pager [phone]  If 7PM-7AM, please contact night-coverage  [email or web address] Password [email or web address]  12/09/2018, 2:41 PM

## 2018-12-09 NOTE — ED Notes (Signed)
Nuclear medicine to pick up patient at 0700 for gallbladder study.  Patient must remain NPO after midnight per nuclear medicine.  [phone]

## 2018-12-09 NOTE — Patient Outreach (Signed)
[facility]) Care Management  12/09/2018  [name] [birth year] [id number]    RN Health Coach notified that the patient ws admitted to the hospital on 12/09/18 for Acute Cholecystitis.  Notified hospital liaison.  Plan:  RN Health Coach will close the program due to admission.  Lazaro Arms RN, BSN, [place] Direct Dial:  [phone]  Fax: [phone]

## 2018-12-09 NOTE — Progress Notes (Signed)
Pharmacy Antibiotic Note  [name] is a 68 y.o. female admitted on 12/09/2018 with intra-abdominal infection.  Pharmacy has been consulted for Zosyn dosing.  Plan: Zosyn 3.375g IV q8h (4 hour infusion).  Monitor labs, c/s, and patient improvement.  Height: 5\' 9"  (175.3 cm) Weight: 145 lb (65.8 kg) IBW/kg (Calculated) : 66.2  Temp (24hrs), Avg:97.8 F (36.6 C), Min:97.8 F (36.6 C), Max:97.8 F (36.6 C)  Recent Labs  Lab 12/09/18 0923  WBC 5.1  CREATININE 0.68    Estimated Creatinine Clearance: 68.9 mL/min (by C-G formula based on SCr of 0.68 mg/dL).    No Known Allergies  Antimicrobials this admission: Zosyn 7/30 >>      Dose adjustments this admission: N/A  Microbiology results: None pending  Thank you for allowing pharmacy to be a part of this patient's care.  Ramond Craver 12/09/2018 3:20 PM

## 2018-12-09 NOTE — ED Triage Notes (Signed)
Patient presents to the ED with complaints of abd pain that began around 0600 this morning. Patient complains of bilateral upper abd pain. Patient denies fevers at home. Reports nausea and vomiting. Patient denies urinary symptoms. Denies blood in stool and urine. Normal bowel habits per patient. NAD noted.

## 2018-12-09 NOTE — Telephone Encounter (Signed)
Pt at ED.

## 2018-12-09 NOTE — Progress Notes (Signed)
Consult request received regarding patient and cholecystectomy Recent extensive cardiac workup. Notes mention history of chronic chest pain. Followed closely by Dr Radford Pax last visit 10/22/18.   03/2018 cardiac CEY:EMVV LVH, LVEF 56%, no WMAs, mild pericardial effusion  10/2018 echo LVEF 61-22%, grade I diastolic dysfunction, mild LVH 10/2017 cath: no CAD, low LVEDP  Upon chart review in the absence of an acute cardiac condition this admission I do not see anything from prior workup that would necesitate repeat cardiac evaluation or testing at this time. Fairly benign very extensive workup within the last year. Mild effusion by cardiac  MRI 03/2018, no evidence by 10/2018 echo.     Carlyle Dolly MD

## 2018-12-09 NOTE — ED Notes (Signed)
Patient sitting up in bed with dinner tray.

## 2018-12-09 NOTE — Progress Notes (Addendum)
[facility]  Patient with possible cholecystitis ? With some fluid and mild thickening. No leukocytosis but has some shift and tenderness. Patient with multiple other medical issues including concern for pericardial effusion, ? Scleroderma and this is all being worked up currently by her PCP. Was suppose to get cardiac MRI tomorrow.  LFTs elevated but could be more related to fatty liver due to high AST/ALT.   Would get hospitalist to admit the patient.  Would get HIDA scan to prove if cholecystitis or not. Antibiotics for now.  Cardiologist to weight in on the chronic effusion, risk stratify pending need for surgery.  GI to evaluate for fatty liver/ cirrhosis. May need liver biopsy if gets lap chole.  NPO.  If HIDA positive and cardiology ok with patient's risk for surgery, would plan for cholecystectomy possible tomorrow.   Will see patient in AM.   Curlene Labrum, MD [facility] [address] [place], [place] [address] [phone] (office)

## 2018-12-09 NOTE — ED Provider Notes (Addendum)
[facility] EMERGENCY DEPARTMENT Provider Note   CSN: [id number] Arrival date & time: 12/09/18  0820    History   Chief Complaint Chief Complaint  Patient presents with  . Abdominal Pain    HPI [name] is a 68 y.o. female with a history as outlined below, most significant for COPD depression, diabetes mellitus which is currently diet-controlled, GERD, hypertension, diverticulosis, hyperlipidemia chronic pericardial effusion, possible scleroderma (currently also under evaluation)  and chronic abdominal pain  and weight loss, currently under evaluation by Dr. Rush Landmark in [place] presenting with severe bilateral upper abdominal pain which started shortly after waking this morning around 6 AM.  Her symptoms were associated with nausea and multiple episodes of nonbloody vomiting.  Vomiting improved her pain as did a bowel movement which occurred shortly after awaking which she reports was a normal stool, not diarrhea, bloody and denies constipation.  She continues to have pain, currently 8 out of 10 but has had no further vomiting since arriving here.  No chest pain or shortness of breath.  She has had no medications prior to arrival for treatment of her symptoms.     HPI  Past Medical History:  Diagnosis Date  . Anterolisthesis    Grade 1, L4-5  . Bronchospasm 05/28/2012  . COPD (chronic obstructive pulmonary disease) ([place])   . DEGENERATIVE JOINT DISEASE 10/06/2006  . DEPRESSION 09/26/2008  . DIABETES MELLITUS 10/06/2006  . Diverticulosis    [id number]  . GERD (gastroesophageal reflux disease) 07/25/2013  . HYPERLIPIDEMIA 01/11/2009  . HYPERTENSION 10/06/2006  . Hypokalemia   . Hypomagnesemia   . INSOMNIA 09/26/2008  . Internal hemorrhoids   . OBSTRUCTIVE SLEEP APNEA 06/23/2008   Severe OSA per sleep study 2010, Rx a CPAP  . Pain in joint, multiple sites 11/10/2006  . Pericardial effusion   . Pericarditis   . UNSPECIFIED ANEMIA 12/10/2009  . UTI (urinary tract infection)  11/2017    Patient Active Problem List   Diagnosis Date Noted  . Pyrosis 03/12/2018  . History of Helicobacter pylori infection 02/01/2018  . Dysphagia 02/01/2018  . Anxiety and depression   . Hypothyroidism 12/22/2017  . ILD (interstitial lung disease) ([place]) 11/25/2017  . Chronic respiratory failure with hypoxia ([place]) 11/12/2017  . Pericardial effusion   . Paresthesia 10/19/2017  . Neck pain 10/19/2017  . PCP NOTES >>> 02/23/2015  . Nocturnal oxygen desaturation 01/02/2015  . Morbid obesity ([place]) 01/02/2015  . Asthma, chronic 01/02/2015  . GERD (gastroesophageal reflux disease) 07/25/2013  . DOE (dyspnea on exertion) 04/25/2013  . Bronchospasm 05/28/2012  . Internal hemorrhoids without mention of complication 71/10/2692  . Annual physical exam 06/06/2011  . Anemia 12/10/2009  . SKIN LESION 08/06/2009  . Hyperlipidemia 01/11/2009  . Depression 09/26/2008  . INSOMNIA 09/26/2008  . OBSTRUCTIVE SLEEP APNEA 06/23/2008  . Chest pain 11/18/2007  . DM II (diabetes mellitus, type II), controlled ([place]) 10/06/2006  . Essential hypertension 10/06/2006  . Osteoarthritis 10/06/2006    Past Surgical History:  Procedure Laterality Date  . BIOPSY  12/28/2017   Procedure: BIOPSY;  Surgeon: Irving Copas., MD;  Location: [place];  Service: Gastroenterology;;  . COLONOSCOPY  08/01/2011   Procedure: COLONOSCOPY;  Surgeon: Inda Castle, MD;  Location: [facility];  Service: Endoscopy;  Laterality: N/A;  . ESOPHAGOGASTRODUODENOSCOPY (EGD) WITH PROPOFOL N/A 12/28/2017   Procedure: ESOPHAGOGASTRODUODENOSCOPY (EGD) WITH PROPOFOL;  Surgeon: Rush Landmark Telford Nab., MD;  Location: [place];  Service: Gastroenterology;  Laterality: N/A;  . LEFT HEART CATH AND  CORONARY ANGIOGRAPHY N/A 10/22/2017   Procedure: LEFT HEART CATH AND CORONARY ANGIOGRAPHY;  Surgeon: Jettie Booze, MD;  Location: [place] [facility];  Service: Cardiovascular;  Laterality: N/A;  . Left knee  replacement  07/2007  . Right knee replacement  2005     OB History   No obstetric history on file.      Home Medications    Prior to Admission medications   Medication Sig Start Date End Date Taking? Authorizing Provider  Accu-Chek Softclix Lancets lancets USE TO CHECK BLOOD SUGAR TWICE A DAY 10/28/18  Yes Paz, Alda Berthold, MD  albuterol (VENTOLIN HFA) 108 (90 Base) MCG/ACT inhaler Inhale 2 puffs into the lungs every 4 (four) hours as needed for wheezing or shortness of breath. 10/28/18  Yes Paz, Alda Berthold, MD  amLODipine (NORVASC) 10 MG tablet Take 1 tablet (10 mg total) by mouth daily. 08/12/18 08/07/19 Yes Turner, Eber Hong, MD  aspirin EC 81 MG tablet Take 1 tablet (81 mg total) by mouth daily. 08/13/18  Yes Paz, Alda Berthold, MD  Blood Glucose Monitoring Suppl (ACCU-CHEK AVIVA) device Use as instructed to check blood sugar twice daily dx E11.9 02/08/18 02/08/19 Yes Renato Shin, MD  colchicine 0.6 MG tablet Take 1 tablet (0.6 mg total) by mouth daily. 10/22/18 12/21/18 Yes Turner, Eber Hong, MD  escitalopram (LEXAPRO) 20 MG tablet Take 1 tablet (20 mg total) by mouth daily. 08/13/18  Yes Paz, Alda Berthold, MD  Fluticasone-Salmeterol (ADVAIR) 250-50 MCG/DOSE AEPB Inhale 2 puffs into the lungs daily. 10/28/18  Yes Paz, Jacqulyn Bath E, MD  glucose blood (ACCU-CHEK AVIVA PLUS) test strip TEST BLOOD SUGAR TWICE DAILY AND LANCETS TWICE DAILY E11.9 10/27/18  Yes Paz, Alda Berthold, MD  latanoprost (XALATAN) 0.005 % ophthalmic solution Place 1 drop into both eyes at bedtime.   Yes [provider]  levothyroxine (SYNTHROID) 75 MCG tablet Take 1 tablet (75 mcg total) by mouth daily before breakfast. 11/30/18  Yes Paz, Alda Berthold, MD  metoprolol tartrate (LOPRESSOR) 25 MG tablet TAKE 2 TABLETS BY MOUTH TWICE DAILY Patient taking differently: Take 50 mg by mouth 2 (two) times daily. TAKE 2 TABLETS BY MOUTH TWICE DAILY 08/13/18  Yes Colon Branch, MD  OVER THE COUNTER MEDICATION CPAP: At bedtime   Yes [provider]  OXYGEN Inhale 2 L  into the lungs continuous.    Yes [provider]  pantoprazole (PROTONIX) 40 MG tablet Take 1 tablet (40 mg total) by mouth daily. 08/12/18  Yes Turner, Eber Hong, MD  potassium chloride SA (K-DUR) 20 MEQ tablet Take 1 tablet (20 mEq total) by mouth 2 (two) times daily. 11/30/18  Yes Paz, Alda Berthold, MD  rosuvastatin (CRESTOR) 40 MG tablet Take 1 tablet (40 mg total) by mouth every evening. 08/13/18  Yes Colon Branch, MD    Family History Family History  Problem Relation Age of Onset  . Asthma Mother   . Stroke Mother   . Diabetes Mother   . Diabetes Other        M, B, S  . Hypertension Sister        M, S,B  . Pancreatic cancer Brother   . Colon cancer Neg Hx   . Prostate cancer Neg Hx   . Breast cancer Neg Hx   . Esophageal cancer Neg Hx   . Liver disease Neg Hx   . Rectal cancer Neg Hx   . Stomach cancer Neg Hx   . Inflammatory bowel disease Neg Hx  Social History Social History   Tobacco Use  . Smoking status: Former Smoker    Packs/day: 0.20    Years: 4.00    Pack years: 0.80    Types: Cigarettes    Quit date: 05/13/1995    Years since quitting: 23.5  . Smokeless tobacco: Never Used  Substance Use Topics  . Alcohol use: Not Currently  . Drug use: No     Allergies   Patient has no known allergies.   Review of Systems Review of Systems  Constitutional: Negative for chills and fever.  HENT: Negative for congestion and sore throat.   Eyes: Negative.   Respiratory: Negative for chest tightness and shortness of breath.   Cardiovascular: Negative for chest pain.  Gastrointestinal: Positive for abdominal pain, nausea and vomiting. Negative for constipation and diarrhea.  Genitourinary: Negative.  Negative for dysuria.  Musculoskeletal: Negative for arthralgias, joint swelling and neck pain.  Skin: Negative.  Negative for rash and wound.  Neurological: Negative for dizziness, weakness, light-headedness, numbness and headaches.  Psychiatric/Behavioral: Negative.       Physical Exam Updated Vital Signs BP (!) 152/83 (BP Location: Right Arm)   Pulse 77   Temp 97.8 F (36.6 C) (Oral)   Resp 19   Ht 5\' 9"  (1.753 m)   Wt 65.8 kg   SpO2 95%   BMI 21.41 kg/m   Physical Exam Vitals signs and nursing note reviewed.  Constitutional:      Appearance: She is well-developed.  HENT:     Head: Normocephalic and atraumatic.  Eyes:     Conjunctiva/sclera: Conjunctivae normal.  Neck:     Musculoskeletal: Normal range of motion.  Cardiovascular:     Rate and Rhythm: Normal rate and regular rhythm.     Heart sounds: Normal heart sounds.  Pulmonary:     Effort: Pulmonary effort is normal.     Breath sounds: Normal breath sounds. No wheezing.  Abdominal:     General: Bowel sounds are normal.     Palpations: Abdomen is soft.     Tenderness: There is no abdominal tenderness.  Musculoskeletal: Normal range of motion.  Skin:    General: Skin is warm and dry.  Neurological:     Mental Status: She is alert.      ED Treatments / Results  Labs (all labs ordered are listed, but only abnormal results are displayed) Labs Reviewed  CBC WITH DIFFERENTIAL/PLATELET - Abnormal; Notable for the following components:      Result Value   Hemoglobin 11.8 (*)    RDW 16.7 (*)    Lymphs Abs 0.5 (*)    All other components within normal limits  COMPREHENSIVE METABOLIC PANEL - Abnormal; Notable for the following components:   Total Protein 8.9 (*)    AST 846 (*)    ALT 194 (*)    Alkaline Phosphatase 362 (*)    All other components within normal limits  URINALYSIS, ROUTINE W REFLEX MICROSCOPIC - Abnormal; Notable for the following components:   Leukocytes,Ua SMALL (*)    Bacteria, UA RARE (*)    All other components within normal limits  SARS CORONAVIRUS 2 (HOSPITAL ORDER, [place] [facility])  LIPASE, BLOOD    EKG None  Radiology US Abdomen Limited Ruq  Result Date: 12/09/2018 CLINICAL DATA:  Right upper quadrant pain with  nausea and vomiting EXAM: ULTRASOUND ABDOMEN LIMITED RIGHT UPPER QUADRANT COMPARISON:  None. FINDINGS: Gallbladder: Within the gallbladder, there is an echogenic focus which moves in  shadows consistent with cholelithiasis. The gallbladder wall is mildly thickened and mildly edematous. There is a slight degree of pericholecystic fluid. The patient is focally tender over the gallbladder. No sonographic Murphy sign noted by sonographer. Common bile duct: Diameter: 5 mm. No intrahepatic or extrahepatic biliary duct dilatation. Liver: No focal lesion identified. Liver echogenicity is increased. Liver contour is mildly lobular. Portal vein is patent on color Doppler imaging with normal direction of blood flow towards the liver. Other: None. IMPRESSION: 1. Cholelithiasis. Gallbladder wall is mildly thickened and edematous with subtle pericholecystic fluid. Patient is tender over the gallbladder. These are findings concerning for acute cholecystitis. 2. Increase in liver echogenicity, a finding felt to represent a degree of hepatic steatosis. No focal liver lesions evident. Liver contour mildly lobular. There may be a degree of underlying hepatic cirrhosis. Appropriate laboratory assessment in this regard advised. Electronically Signed   By: Lowella Grip III M.D.   On: 12/09/2018 13:12    Procedures Procedures (including critical care time)  Medications Ordered in ED Medications  piperacillin-tazobactam (ZOSYN) IVPB 3.375 g (has no administration in time range)  morphine 4 MG/ML injection 4 mg (4 mg Intramuscular Given 12/09/18 1147)  ondansetron (ZOFRAN-ODT) disintegrating tablet 4 mg (4 mg Oral Given 12/09/18 1148)     Initial Impression / Assessment and Plan / ED Course  I have reviewed the triage vital signs and the nursing notes.  Pertinent labs & imaging results that were available during my care of the patient were reviewed by me and considered in my medical decision making (see chart for  details).        Call to Dr. Constance Haw.  Not clear cut acute cholecystitis, recommends HIDA scan, admit to hospitalist service.  Other recommendations for inpatient risk stratification/ cards and GI consults suggested prior to surgery.  Final Clinical Impressions(s) / ED Diagnoses   Final diagnoses:  Upper abdominal pain  Acute cholecystitis  Elevated transaminase level    ED Discharge Orders    None       Landis Martins 12/09/18 1405    Evalee Jefferson, PA-C 12/09/18 1406    Lajean Saver, MD 12/10/18 2100

## 2018-12-10 ENCOUNTER — Encounter (HOSPITAL_COMMUNITY): Payer: Self-pay

## 2018-12-10 ENCOUNTER — Inpatient Hospital Stay (HOSPITAL_COMMUNITY): Payer: Medicare Other

## 2018-12-10 ENCOUNTER — Ambulatory Visit (HOSPITAL_COMMUNITY): Admission: RE | Admit: 2018-12-10 | Payer: Medicare Other | Source: Ambulatory Visit

## 2018-12-10 DIAGNOSIS — K7460 Unspecified cirrhosis of liver: Secondary | ICD-10-CM

## 2018-12-10 DIAGNOSIS — R7401 Elevation of levels of liver transaminase levels: Secondary | ICD-10-CM

## 2018-12-10 DIAGNOSIS — R1010 Upper abdominal pain, unspecified: Secondary | ICD-10-CM

## 2018-12-10 DIAGNOSIS — K810 Acute cholecystitis: Secondary | ICD-10-CM

## 2018-12-10 DIAGNOSIS — R740 Nonspecific elevation of levels of transaminase and lactic acid dehydrogenase [LDH]: Secondary | ICD-10-CM

## 2018-12-10 DIAGNOSIS — R112 Nausea with vomiting, unspecified: Secondary | ICD-10-CM

## 2018-12-10 LAB — COMPREHENSIVE METABOLIC PANEL
ALT: 269 U/L — ABNORMAL HIGH (ref 0–44)
AST: 350 U/L — ABNORMAL HIGH (ref 15–41)
Albumin: 3.2 g/dL — ABNORMAL LOW (ref 3.5–5.0)
Alkaline Phosphatase: 356 U/L — ABNORMAL HIGH (ref 38–126)
Anion gap: 7 (ref 5–15)
BUN: 11 mg/dL (ref 8–23)
CO2: 24 mmol/L (ref 22–32)
Calcium: 8.9 mg/dL (ref 8.9–10.3)
Chloride: 105 mmol/L (ref 98–111)
Creatinine, Ser: 0.76 mg/dL (ref 0.44–1.00)
GFR calc Af Amer: >60 mL/min (ref >60)
GFR calc non Af Amer: >60 mL/min (ref >60)
Glucose, Bld: 93 mg/dL (ref 70–99)
Potassium: 3.8 mmol/L (ref 3.5–5.1)
Sodium: 136 mmol/L (ref 135–145)
Total Bilirubin: 1 mg/dL (ref 0.3–1.2)
Total Protein: 7.6 g/dL (ref 6.5–8.1)

## 2018-12-10 LAB — GLUCOSE, CAPILLARY
Glucose-Capillary: 69 mg/dL — ABNORMAL LOW (ref 70–99)
Glucose-Capillary: 106 mg/dL — ABNORMAL HIGH (ref 70–99)
Glucose-Capillary: 85 mg/dL (ref 70–99)
Glucose-Capillary: 97 mg/dL (ref 70–99)
Glucose-Capillary: 104 mg/dL — ABNORMAL HIGH (ref 70–99)

## 2018-12-10 LAB — HIV ANTIBODY (ROUTINE TESTING W REFLEX): HIV Screen 4th Generation wRfx: NONREACTIVE

## 2018-12-10 LAB — CBC
HCT: 31.5 % — ABNORMAL LOW (ref 36.0–46.0)
Hemoglobin: 10.4 g/dL — ABNORMAL LOW (ref 12.0–15.0)
MCH: 26.9 pg (ref 26.0–34.0)
MCHC: 33 g/dL (ref 30.0–36.0)
MCV: 81.6 fL (ref 80.0–100.0)
Platelets: 250 10*3/uL (ref 150–400)
RBC: 3.86 MIL/uL — ABNORMAL LOW (ref 3.87–5.11)
RDW: 16.4 % — ABNORMAL HIGH (ref 11.5–15.5)
WBC: 2 10*3/uL — ABNORMAL LOW (ref 4.0–10.5)
nRBC: 0 % (ref 0.0–0.2)

## 2018-12-10 MED ORDER — TECHNETIUM TC 99M MEBROFENIN IV KIT
5.0000 | PACK | Freq: Once | INTRAVENOUS | Status: AC | PRN
  Administered 2018-12-10: 5.4 via INTRAVENOUS

## 2018-12-10 MED ORDER — DEXTROSE 50 % IV SOLN
INTRAVENOUS | Status: AC
  Administered 2018-12-10: 25 mL
  Filled 2018-12-10: qty 50

## 2018-12-10 MED ORDER — SINCALIDE 5 MCG IJ SOLR
INTRAMUSCULAR | Status: AC
  Administered 2018-12-10: 1.32 ug via INTRAVENOUS
  Filled 2018-12-10: qty 5

## 2018-12-10 MED ORDER — PANTOPRAZOLE SODIUM 40 MG PO TBEC
40.0000 mg | DELAYED_RELEASE_TABLET | Freq: Two times a day (BID) | ORAL | Status: DC
  Administered 2018-12-10 – 2018-12-11 (×2): 40 mg via ORAL
  Filled 2018-12-10 (×2): qty 1

## 2018-12-10 MED ORDER — SODIUM CHLORIDE FLUSH 0.9 % IV SOLN
INTRAVENOUS | Status: AC
  Administered 2018-12-10: 10 mL
  Filled 2018-12-10: qty 40

## 2018-12-10 MED ORDER — STERILE WATER FOR INJECTION IJ SOLN
INTRAMUSCULAR | Status: AC
  Administered 2018-12-10: 1.32 mL via INTRAVENOUS
  Filled 2018-12-10: qty 10

## 2018-12-10 NOTE — Consult Note (Addendum)
Referring Provider: [facility] Primary Care Physician:  Colon Branch, MD Primary Gastroenterologist:  Dr. Rush Landmark ([facility])  Date of Admission: 12/09/2018 Date of Consultation: 12/10/2018  Reason for Consultation:  Elevated LFTs, weight loss, N/V  HPI:  [name] is a 68 y.o. female with a past medical history of COPD, depression, diabetes, diverticulosis, GERD, hypertension, internal hemorrhoids, anemia. She also has possible scleroderma with previous pericarditis and ventricular stiffness as well as interstitial lung disease. She has a Rheumatology appointment next week. She who presented 12/09/2018 after awaking in the morning with epigastric pain.  She is also been having some ongoing intermittent nausea and vomiting over the last several months as well as poor oral intake.  Denied hematemesis.  Her pain was pretty significant in the emergency department rated 8/10.  No vomiting since presentation.  Surgery was consulted and had tentative plans for a lap cholecystectomy today.  Labs ordered showed significant elevated LFTs with initial AST/ALT at 846/194, alkaline phosphatase 362, bilirubin at 1.2.  Today these improved somewhat to AST/ALT of 350/269, alkaline phosphatase stable at 356, total bilirubin stable at 1.0.  Of note, historically her LFTs have been normal.  Abdominal imaging was undertaken including right upper quadrant ultrasound yesterday which found within the gallbladder and echogenic focus consistent with cholelithiasis, gallbladder wall mildly thickened and mildly edematous, tenderness over the gallbladder.  Common bile duct normal diameter at 5 mm without intrahepatic or extrahepatic biliary duct dilation.  No focal liver lesion, liver echogenicity slightly increased and mildly lobular that overall felt to represent a degree of hepatic steatosis although possibly a degree of underlying hepatic cirrhosis.  Of note she had an abdominal CT about a year ago although  there is no mention of the liver in general to allow comparison.  HIDA scan completed today found continued accumulation of radiotracer after CCK administration possibly due to biliary dyskinesia, chronic cholecystitis, or opioid use.  Review of previous labs found hepatitis C and hepatitis B serologies in 2016 which were all normal.  Today she states she's feeling better today. Abdominal pain is significantly improved. No N/V today. States she been having vomiting after almost every meal, feels like food/pills "get stuck in my throat." No obvious triggers for this. Because of these symptoms and subsequent poor appetite, she has had about 100 lb weight loss over the past year.Denies hematochezia, melena, fever. Denies yellowing of skin/eyes, darkened urine. No other GI complaints.  Past Medical History:  Diagnosis Date  . Anterolisthesis    Grade 1, L4-5  . Bronchospasm 05/28/2012  . COPD (chronic obstructive pulmonary disease) ([place])   . DEGENERATIVE JOINT DISEASE 10/06/2006  . DEPRESSION 09/26/2008  . DIABETES MELLITUS 10/06/2006  . Diverticulosis    [id number]  . GERD (gastroesophageal reflux disease) 07/25/2013  . HYPERLIPIDEMIA 01/11/2009  . HYPERTENSION 10/06/2006  . Hypokalemia   . Hypomagnesemia   . INSOMNIA 09/26/2008  . Internal hemorrhoids   . OBSTRUCTIVE SLEEP APNEA 06/23/2008   Severe OSA per sleep study 2010, Rx a CPAP  . Pain in joint, multiple sites 11/10/2006  . Pericardial effusion   . Pericarditis   . UNSPECIFIED ANEMIA 12/10/2009  . UTI (urinary tract infection) 11/2017    Past Surgical History:  Procedure Laterality Date  . BIOPSY  12/28/2017   Procedure: BIOPSY;  Surgeon: Irving Copas., MD;  Location: [facility];  Service: Gastroenterology;;  . COLONOSCOPY  08/01/2011   Procedure: COLONOSCOPY;  Surgeon: Inda Castle, MD;  Location: [facility];  Service: Endoscopy;  Laterality: N/A;  . ESOPHAGOGASTRODUODENOSCOPY (EGD) WITH PROPOFOL N/A 12/28/2017    Procedure: ESOPHAGOGASTRODUODENOSCOPY (EGD) WITH PROPOFOL;  Surgeon: Rush Landmark Telford Nab., MD;  Location: [facility];  Service: Gastroenterology;  Laterality: N/A;  . LEFT HEART CATH AND CORONARY ANGIOGRAPHY N/A 10/22/2017   Procedure: LEFT HEART CATH AND CORONARY ANGIOGRAPHY;  Surgeon: Jettie Booze, MD;  Location: [facility];  Service: Cardiovascular;  Laterality: N/A;  . 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 Right knee replacement  2005    Prior to Admission medications   Medication Sig Start Date End Date Taking? Authorizing Provider  Accu-Chek Softclix Lancets lancets USE TO CHECK BLOOD SUGAR TWICE A DAY 10/28/18  Yes Paz, Alda Berthold, MD  albuterol (VENTOLIN HFA) 108 (90 Base) MCG/ACT inhaler Inhale 2 puffs into the lungs every 4 (four) hours as needed for wheezing or shortness of breath. 10/28/18  Yes Paz, Alda Berthold, MD  amLODipine (NORVASC) 10 MG tablet Take 1 tablet (10 mg total) by mouth daily. 08/12/18 08/07/19 Yes Turner, Eber Hong, MD  aspirin EC 81 MG tablet Take 1 tablet (81 mg total) by mouth daily. 08/13/18  Yes Paz, Alda Berthold, MD  Blood Glucose Monitoring Suppl (ACCU-CHEK AVIVA) device Use as instructed to check blood sugar twice daily dx E11.9 02/08/18 02/08/19 Yes Renato Shin, MD  colchicine 0.6 MG tablet Take 1 tablet (0.6 mg total) by mouth daily. 10/22/18 12/21/18 Yes Turner, Eber Hong, MD  escitalopram (LEXAPRO) 20 MG tablet Take 1 tablet (20 mg total) by mouth daily. 08/13/18  Yes Paz, Alda Berthold, MD  Fluticasone-Salmeterol (ADVAIR) 250-50 MCG/DOSE AEPB Inhale 2 puffs into the lungs daily. 10/28/18  Yes Paz, Jacqulyn Bath E, MD  glucose blood (ACCU-CHEK AVIVA PLUS) test strip TEST BLOOD SUGAR TWICE DAILY AND LANCETS TWICE DAILY E11.9 10/27/18  Yes Paz, Alda Berthold, MD  latanoprost (XALATAN) 0.005 % ophthalmic solution Place 1 drop into both eyes at bedtime.   Yes [provider]  levothyroxine (SYNTHROID) 75 MCG tablet Take 1 tablet (75 mcg total) by mouth daily before breakfast. 11/30/18  Yes  Paz, Alda Berthold, MD  metoprolol tartrate (LOPRESSOR) 25 MG tablet TAKE 2 TABLETS BY MOUTH TWICE DAILY Patient taking differently: Take 50 mg by mouth 2 (two) times daily. TAKE 2 TABLETS BY MOUTH TWICE DAILY 08/13/18  Yes Colon Branch, MD  OVER THE COUNTER MEDICATION CPAP: At bedtime   Yes [provider]  OXYGEN Inhale 2 L into the lungs continuous.    Yes [provider]  pantoprazole (PROTONIX) 40 MG tablet Take 1 tablet (40 mg total) by mouth daily. 08/12/18  Yes Turner, Eber Hong, MD  potassium chloride SA (K-DUR) 20 MEQ tablet Take 1 tablet (20 mEq total) by mouth 2 (two) times daily. 11/30/18  Yes Paz, Alda Berthold, MD  rosuvastatin (CRESTOR) 40 MG tablet Take 1 tablet (40 mg total) by mouth every evening. 08/13/18  Yes Colon Branch, MD    Current Facility-Administered Medications  Medication Dose Route Frequency Provider Last Rate Last Dose  . alum & mag hydroxide-simeth (MAALOX/MYLANTA) 200-200-20 MG/5ML suspension 30 mL  30 mL Oral Q4H PRN Manuella Ghazi, Pratik D, DO   30 mL at 12/09/18 2129  . amLODipine (NORVASC) tablet 10 mg  10 mg Oral Daily Manuella Ghazi, Pratik D, DO   10 mg at 12/10/18 1050  . enoxaparin (LOVENOX) injection 40 mg  40 mg Subcutaneous Q24H Manuella Ghazi, Pratik D, DO   40 mg at 12/09/18 1818  . escitalopram (LEXAPRO)  tablet 20 mg  20 mg Oral Daily Manuella Ghazi, Pratik D, DO   20 mg at 12/10/18 1050  . HYDROmorphone (DILAUDID) injection 0.5 mg  0.5 mg Intravenous Q3H PRN Manuella Ghazi, Pratik D, DO      . insulin aspart (novoLOG) injection 0-5 Units  0-5 Units Subcutaneous QHS Shah, Pratik D, DO      . insulin aspart (novoLOG) injection 0-9 Units  0-9 Units Subcutaneous TID WC Shah, Pratik D, DO      . lactated ringers infusion   Intravenous Continuous Manuella Ghazi, Pratik D, DO 75 mL/hr at 12/10/18 1045    . latanoprost (XALATAN) 0.005 % ophthalmic solution 1 drop  1 drop Both Eyes QHS Manuella Ghazi, Pratik D, DO      . levothyroxine (SYNTHROID) tablet 75 mcg  75 mcg Oral QAC breakfast Manuella Ghazi, Pratik D, DO      . metoprolol  tartrate (LOPRESSOR) tablet 50 mg  50 mg Oral BID Manuella Ghazi, Pratik D, DO   50 mg at 12/10/18 1050  . mometasone-formoterol (DULERA) 200-5 MCG/ACT inhaler 2 puff  2 puff Inhalation BID Manuella Ghazi, Pratik D, DO   2 puff at 12/09/18 1614  . ondansetron (ZOFRAN) tablet 4 mg  4 mg Oral Q6H PRN Manuella Ghazi, Pratik D, DO       Or  . ondansetron (ZOFRAN) injection 4 mg  4 mg Intravenous Q6H PRN Manuella Ghazi, Pratik D, DO   4 mg at 12/09/18 1921  . pantoprazole (PROTONIX) EC tablet 40 mg  40 mg Oral Daily Manuella Ghazi, Pratik D, DO   40 mg at 12/10/18 1050    Allergies as of 12/09/2018  . (No Known Allergies)    Family History  Problem Relation Age of Onset  . Asthma Mother   . Stroke Mother   . Diabetes Mother   . Diabetes Other        M, B, S  . Hypertension Sister        M, S,B  . Pancreatic cancer Brother   . Colon cancer Neg Hx   . Prostate cancer Neg Hx   . Breast cancer Neg Hx   . Esophageal cancer Neg Hx   . Liver disease Neg Hx   . Rectal cancer Neg Hx   . Stomach cancer Neg Hx   . Inflammatory bowel disease Neg Hx     Social History   Socioeconomic History  . Marital status: Widowed    Spouse name: Not on file  . Number of children: 4  . Years of education: Not on file  . Highest education level: Not on file  Occupational History  . Occupation: disability    Employer: UNEMPLOYED  Social Needs  . Financial resource strain: Not on file  . Food insecurity    Worry: Not on file    Inability: Not on file  . Transportation needs    Medical: Not on file    Non-medical: Not on file  Tobacco Use  . Smoking status: Former Smoker    Packs/day: 0.20    Years: 4.00    Pack years: 0.80    Types: Cigarettes    Quit date: 05/13/1995    Years since quitting: 23.5  . Smokeless tobacco: Never Used  Substance and Sexual Activity  . Alcohol use: Not Currently  . Drug use: No  . Sexual activity: Not Currently  Lifestyle  . Physical activity    Days per week: Not on file    Minutes per session: Not on file   . Stress:  Not on file  Relationships  . Social Herbalist on phone: Not on file    Gets together: Not on file    Attends religious service: Not on file    Active member of club or organization: Not on file    Attends meetings of clubs or organizations: Not on file    Relationship status: Not on file  . Intimate partner violence    Fear of current or ex partner: Not on file    Emotionally abused: Not on file    Physically abused: Not on file    Forced sexual activity: Not on file  Other Topics Concern  . Not on file  Social History Narrative   Widow , lives by herself   Lost a son, 3 living    No children in [place], [place] lives in [place], he visits and helps w/ shopping    Lost husband    Review of Systems: General: Negative for anorexia, weight loss, fever, chills, fatigue, weakness. ENT: Negative for hoarseness, difficulty swallowing. CV: Negative for chest pain, angina, palpitations, peripheral edema.  Respiratory: Negative for dyspnea at rest, cough, sputum, wheezing.  GI: See history of present illness. Derm: Negative for rash or itching.  Endo: Negative for unusual weight change.  Heme: Negative for bruising or bleeding. Allergy: Negative for rash or hives.  Physical Exam: Vital signs in last 24 hours: Temp:  [97.8 F (36.6 C)-98.6 F (37 C)] 98.2 F (36.8 C) (07/31 0616) Pulse Rate:  [74-87] 74 (07/31 0616) Resp:  [15-16] 15 (07/31 0616) BP: (142-175)/(88-102) 144/91 (07/31 0616) SpO2:  [94 %-100 %] 98 % (07/31 0616) Weight:  [65 kg] 65 kg (07/30 2013) Last BM Date: 12/09/18 General:   Alert,  Well-developed, well-nourished, pleasant and cooperative in NAD Head:  Normocephalic and atraumatic. Eyes:  Sclera clear, no icterus. Conjunctiva pink. Ears:  Normal auditory acuity. Neck:  Supple; no masses or thyromegaly. Lungs:  Clear throughout to auscultation. No wheezes, crackles, or rhonchi. No acute distress. Heart:  Regular rate and rhythm; no  murmurs, clicks, rubs,  or gallops. Abdomen:  Soft, and nondistended. Moderate epigastric and RUQ TTP. No masses, hepatosplenomegaly or hernias noted. Normal bowel sounds, without guarding, and without rebound.   Rectal:  Deferred.   Msk:  Symmetrical without gross deformities. Pulses:  Normal pulses noted. Extremities:  Without clubbing or edema. Neurologic:  Alert and  oriented x4;  grossly normal neurologically. Psych:  Alert and cooperative. Normal mood and affect.  Intake/Output from previous day: 07/30 0701 - 07/31 0700 In: 638.7 [I.V.:588.7; IV Piggyback:50] Out: -  Intake/Output this shift: No intake/output data recorded.  Lab Results: Recent Labs    12/09/18 0923 12/10/18 0502  WBC 5.1 2.0*  HGB 11.8* 10.4*  HCT 37.0 31.5*  PLT 285 250   BMET Recent Labs    12/09/18 0923 12/10/18 0502  NA 137 136  K 3.8 3.8  CL 103 105  CO2 24 24  GLUCOSE 89 93  BUN 12 11  CREATININE 0.68 0.76  CALCIUM 9.0 8.9   LFT Recent Labs    12/09/18 0923 12/10/18 0502  PROT 8.9* 7.6  ALBUMIN 3.9 3.2*  AST 846* 350*  ALT 194* 269*  ALKPHOS 362* 356*  BILITOT 1.2 1.0   PT/INR No results for input(s): LABPROT, INR in the last 72 hours. Hepatitis Panel No results for input(s): HEPBSAG, HCVAB, HEPAIGM, HEPBIGM in the last 72 hours. C-Diff No results for input(s): CDIFFTOX in the last 72  hours.  Studies/Results: Nm Hepato W/eject Fract  Result Date: 12/10/2018 CLINICAL DATA:  Abdominal pain, nausea and vomiting for 3 years. EXAM: NUCLEAR MEDICINE HEPATOBILIARY IMAGING WITH GALLBLADDER EF TECHNIQUE: Sequential images of the abdomen were obtained out to 60 minutes following intravenous administration of radiopharmaceutical. After slow intravenous infusion of 1.32 micrograms Cholecystokinin, gallbladder ejection fraction was determined. RADIOPHARMACEUTICALS:  5.4 mCi Tc-37m Choletec IV COMPARISON:  None. FINDINGS: Prompt uptake and biliary excretion of activity by the liver is  seen. Gallbladder activity is visualized, consistent with patency of cystic duct. Biliary activity passes into small bowel, consistent with patent common bile duct. CCK was administered but there was no response to CCK and radiotracer continued to accumulate in the gallbladder after CCK. (At 60 min, normal ejection fraction is greater than 40%.) IMPRESSION: Continued accumulation of radiotracer after CCK administration could be due to biliary dyskinesia, opioid use or chronic cholecystitis. Electronically Signed   By: Inge Rise M.D.   On: 12/10/2018 09:43   US Abdomen Limited Ruq  Result Date: 12/09/2018 CLINICAL DATA:  Right upper quadrant pain with nausea and vomiting EXAM: ULTRASOUND ABDOMEN LIMITED RIGHT UPPER QUADRANT COMPARISON:  None. FINDINGS: Gallbladder: Within the gallbladder, there is an echogenic focus which moves in shadows consistent with cholelithiasis. The gallbladder wall is mildly thickened and mildly edematous. There is a slight degree of pericholecystic fluid. The patient is focally tender over the gallbladder. No sonographic Murphy sign noted by sonographer. Common bile duct: Diameter: 5 mm. No intrahepatic or extrahepatic biliary duct dilatation. Liver: No focal lesion identified. Liver echogenicity is increased. Liver contour is mildly lobular. Portal vein is patent on color Doppler imaging with normal direction of blood flow towards the liver. Other: None. IMPRESSION: 1. Cholelithiasis. Gallbladder wall is mildly thickened and edematous with subtle pericholecystic fluid. Patient is tender over the gallbladder. These are findings concerning for acute cholecystitis. 2. Increase in liver echogenicity, a finding felt to represent a degree of hepatic steatosis. No focal liver lesions evident. Liver contour mildly lobular. There may be a degree of underlying hepatic cirrhosis. Appropriate laboratory assessment in this regard advised. Electronically Signed   By: Lowella Grip III  M.D.   On: 12/09/2018 13:12    Impression: Pleasant 68 year old female who presented with significant abdominal pain, nausea and vomiting that has been intermittent over the past several months, as well as poor appetite and subsequent weight loss.  Work-up found likely acute cholecystitis.  However, she has elevated transaminases and alkaline phosphatase raising the suspicion for choledocholithiasis.  Her ultrasound found normal CBD dilation and if she did have a common duct stone it is likely passed or was microlithiasis.  Of note, her labs are improving today.  No leukocytosis noted. R-value: on admission 1.53 (suggestive of cholestatic pattern).  Her symptoms seem to be improving. No overt hepatic symptoms. Scleroderma could be affecting her GI tract. Scleroderma, when it affects the GI tract, most often affects the esophagus causing symptoms such as dysphagia and choking. Other GI manifestations include pyrosis, early satiety, bloating, episodic pseudo-obstruction. No significant liver manifestations found.  Previous EGD in 12/2017 showed tortuous esophagus, abnormal gastric mucosa + H. Pylori (has not completed post-treatment confirmation of eradication).  1. Postprandial vomiting, nausea - Recent EGD demonstrating tortuous esophagus. Possibly due to esophageal manifestations of ? Scleroderma (as per above.) Other differentials include delayed gastric emptying from DM/possible early cirrhosis, less likely pyloric stenosis. No urgent need for IP EGD at this time. Can consider BPE if persistent/worsening symptoms. 2.  Weight loss - likely secondary to #1 with poor appetite and post-prandial vomiting. Doubt significant UGI malignancy within the past year (since recent EGD) 3. Elevated LFTs- LFTs appear in a cholestatic pattern. Given recent cholecystitis, likely microlithiasis or passed stone. CBD appears normal now. No noted hepatic manifestations of scleroderma in review of literature. Continue to trend  labs and if continued concern for CBD abnormality would recommend MRCP  Plan: 1. Soft diet 2. Trend LFTs 3. PPI bid 4. Monitor for worsening symptoms 5. MRCP if renewed concerns for CBD abnormality 6. Consider BPE if persistent post-prandial vomiting 7. Supportive measures   Thank you for allowing [place] to participate in the care of Melissa Hatchet, DNP, AGNP-C Adult & Gerontological Nurse Practitioner [facility]    LOS: 1 day     12/10/2018, 1:25 PM

## 2018-12-10 NOTE — Progress Notes (Signed)
PROGRESS NOTE    [name]  TMA:[id number] DOB: 1950/06/20 DOA: 12/09/2018 PCP: Colon Branch, MD   Brief Narrative:  Per HPI: [name] is a 68 y.o. female with medical history significant for COPD, depression, diabetes, GERD, hypertension, dyslipidemia, questionable scleroderma, and recent 2D echocardiogram 6/22 with increased ventricular stiffness, who began having severe epigastric abdominal pain which started shortly after awakening this morning around 6 AM.  She states that she has been having some ongoing, intermittent nausea and vomiting over the last several months and has had overall poor oral intake.  She denies any hematemesis and does states she has some mild diarrhea.  She states that she had pain earlier on arrival to the ED 8/10, but this has now improved.  No vomiting since arrival noted.  She denies any chest pain or shortness of breath.  Patient has had a pericardial effusion noted on recent 2D echocardiogram and was due to have cardiac MRI in a.m.  Patient was admitted with suspected acute cholecystitis with associated transaminitis.  She has undergone HIDA scan on 7/31 with demonstrated findings of chronic cholecystitis and has been seen by general surgery for evaluation for laparoscopic cholecystectomy.  It appears that patient has been having ongoing weight loss for several months now and has had prior EGD demonstrating possible esophageal issues related to scleroderma which is being investigated.  It is currently unclear what the exact source of her epigastric abdominal pain is and surgical risk for laparoscopic cholecystectomy appears to be somewhat elevated.  Assessment & Plan:   Active Problems:   Acute cholecystitis   Intractable abdominal pain with nausea and vomiting and weight loss -HIDA scan on 7/31 with chronic cholecystitis, but no acute findings -Appreciate general surgery evaluation and will plan to withhold laparoscopic cholecystectomy for now  as well as IV Zosyn -Appreciate ongoing general surgery evaluation who has recommended GI evaluation for possible repeat endoscopy -Continue current diet -Patient to remain on maintenance IV fluid -Pain management as needed for breakthrough -IV Zofran as needed for nausea or vomiting -Repeat CMP in a.m. -Anticipate discharge soon and further evaluation in outpatient setting should she clinically improve  Type 2 diabetes-stable -Maintain on SSI  Hypothyroidism -Maintain on Synthroid  Hypertension-stable -Maintain on home medications  Dyslipidemia -Hold Crestor for now given transaminitis  GERD -PPI   DVT prophylaxis: Lovenox Code Status: Full Family Communication: None at bedside Disposition Plan: Further evaluation per general surgery and GI regarding nausea and vomiting as well as transaminitis.   Consultants:   General surgery  Consult GI 7/31  Procedures:   HIDA scan 7/31  Antimicrobials:  Anti-infectives (From admission, onward)   Start     Dose/Rate Route Frequency Ordered Stop   12/09/18 2200  piperacillin-tazobactam (ZOSYN) IVPB 3.375 g  Status:  Discontinued     3.375 g 12.5 mL/hr over 240 Minutes Intravenous Every 8 hours 12/09/18 1519 12/10/18 1322   12/09/18 1515  piperacillin-tazobactam (ZOSYN) IVPB 3.375 g     3.375 g 100 mL/hr over 30 Minutes Intravenous  Once 12/09/18 1504 12/09/18 1659   12/09/18 1345  piperacillin-tazobactam (ZOSYN) IVPB 3.375 g  Status:  Discontinued     3.375 g 12.5 mL/hr over 240 Minutes Intravenous  Once 12/09/18 1334 12/09/18 1456      Subjective: Patient seen and evaluated today with no new acute complaints or concerns. No acute concerns or events noted overnight.  She continues to have some mild nausea but appears to be tolerating her diet.  Objective: Vitals:   12/09/18 1900 12/09/18 1930 12/09/18 2013 12/10/18 0616  BP: (!) 143/88 (!) 152/94 (!) 168/102 (!) 144/91  Pulse:  83 87 74  Resp:    15  Temp:    98.6 F (37 C) 98.2 F (36.8 C)  TempSrc:   Oral Oral  SpO2:  98% 99% 98%  Weight:   65 kg   Height:   5\' 9"  (1.753 m)     Intake/Output Summary (Last 24 hours) at 12/10/2018 1323 Last data filed at 12/10/2018 0400 Gross per 24 hour  Intake 638.67 ml  Output --  Net 638.67 ml   Filed Weights   12/09/18 0914 12/09/18 2013  Weight: 65.8 kg 65 kg    Examination:  General exam: Appears calm and comfortable  Respiratory system: Clear to auscultation. Respiratory effort normal. Cardiovascular system: S1 & S2 heard, RRR. No JVD, murmurs, rubs, gallops or clicks. No pedal edema. Gastrointestinal system: Abdomen is nondistended, soft and nontender. No organomegaly or masses felt. Normal bowel sounds heard. Central nervous system: Alert and oriented. No focal neurological deficits. Extremities: Symmetric 5 x 5 power. Skin: No rashes, lesions or ulcers Psychiatry: Judgement and insight appear normal. Mood & affect appropriate.     Data Reviewed: I have personally reviewed following labs and imaging studies  CBC: Recent Labs  Lab 12/09/18 0923 12/10/18 0502  WBC 5.1 2.0*  NEUTROABS 4.3  --   HGB 11.8* 10.4*  HCT 37.0 31.5*  MCV 83.5 81.6  PLT 285 505   Basic Metabolic Panel: Recent Labs  Lab 12/09/18 0923 12/10/18 0502  NA 137 136  K 3.8 3.8  CL 103 105  CO2 24 24  GLUCOSE 89 93  BUN 12 11  CREATININE 0.68 0.76  CALCIUM 9.0 8.9   GFR: Estimated Creatinine Clearance: 68.1 mL/min (by C-G formula based on SCr of 0.76 mg/dL). Liver Function Tests: Recent Labs  Lab 12/09/18 0923 12/10/18 0502  AST 846* 350*  ALT 194* 269*  ALKPHOS 362* 356*  BILITOT 1.2 1.0  PROT 8.9* 7.6  ALBUMIN 3.9 3.2*   Recent Labs  Lab 12/09/18 0923  LIPASE 45   No results for input(s): AMMONIA in the last 168 hours. Coagulation Profile: No results for input(s): INR, PROTIME in the last 168 hours. Cardiac Enzymes: No results for input(s): CKTOTAL, CKMB, CKMBINDEX, TROPONINI in  the last 168 hours. BNP (last 3 results) Recent Labs    03/04/18 1351  PROBNP 1,747*   HbA1C: Recent Labs    12/09/18 0923  HGBA1C 5.5   CBG: Recent Labs  Lab 12/09/18 1856 12/09/18 2128 12/10/18 0942 12/10/18 1050 12/10/18 1237  GLUCAP 167* 119* 69* 106* 85   Lipid Profile: No results for input(s): CHOL, HDL, LDLCALC, TRIG, CHOLHDL, LDLDIRECT in the last 72 hours. Thyroid Function Tests: No results for input(s): TSH, T4TOTAL, FREET4, T3FREE, THYROIDAB in the last 72 hours. Anemia Panel: No results for input(s): VITAMINB12, FOLATE, FERRITIN, TIBC, IRON, RETICCTPCT in the last 72 hours. Sepsis Labs: No results for input(s): PROCALCITON, LATICACIDVEN in the last 168 hours.  Recent Results (from the past 240 hour(s))  SARS Coronavirus 2 (CEPHEID - Performed in [place] hospital lab), Hosp Order     Status: None   Collection Time: 12/09/18  1:35 PM   Specimen: Nasopharyngeal Swab  Result Value Ref Range Status   SARS Coronavirus 2 NEGATIVE NEGATIVE Final    Comment: (NOTE) If result is NEGATIVE SARS-CoV-2 target nucleic acids are NOT DETECTED. The SARS-CoV-2 RNA  is generally detectable in upper and lower  respiratory specimens during the acute phase of infection. The lowest  concentration of SARS-CoV-2 viral copies this assay can detect is 250  copies / mL. A negative result does not preclude SARS-CoV-2 infection  and should not be used as the sole basis for treatment or other  patient management decisions.  A negative result may occur with  improper specimen collection / handling, submission of specimen other  than nasopharyngeal swab, presence of viral mutation(s) within the  areas targeted by this assay, and inadequate number of viral copies  (<250 copies / mL). A negative result must be combined with clinical  observations, patient history, and epidemiological information. If result is POSITIVE SARS-CoV-2 target nucleic acids are DETECTED. The SARS-CoV-2 RNA  is generally detectable in upper and lower  respiratory specimens dur ing the acute phase of infection.  Positive  results are indicative of active infection with SARS-CoV-2.  Clinical  correlation with patient history and other diagnostic information is  necessary to determine patient infection status.  Positive results do  not rule out bacterial infection or co-infection with other viruses. If result is PRESUMPTIVE POSTIVE SARS-CoV-2 nucleic acids MAY BE PRESENT.   A presumptive positive result was obtained on the submitted specimen  and confirmed on repeat testing.  While 2019 novel coronavirus  (SARS-CoV-2) nucleic acids may be present in the submitted sample  additional confirmatory testing may be necessary for epidemiological  and / or clinical management purposes  to differentiate between  SARS-CoV-2 and other Sarbecovirus currently known to infect humans.  If clinically indicated additional testing with an alternate test  methodology [phone]) is advised. The SARS-CoV-2 RNA is generally  detectable in upper and lower respiratory sp ecimens during the acute  phase of infection. The expected result is Negative. Fact Sheet for Patients:  StrictlyIdeas.no Fact Sheet for Healthcare Providers: BankingDealers.co.za This test is not yet approved or cleared by the [place] FDA and has been authorized for detection and/or diagnosis of SARS-CoV-2 by FDA under an Emergency Use Authorization (EUA).  This EUA will remain in effect (meaning this test can be used) for the duration of the COVID-19 declaration under Section 564(b)(1) of the Act, 21 U.S.C. section 360bbb-3(b)(1), unless the authorization is terminated or revoked sooner. Performed at [facility], [address], [place], [place] [address]          Radiology Studies: Nm Hepato W/eject Fract  Result Date: 12/10/2018 CLINICAL DATA:  Abdominal pain, nausea and vomiting for  3 years. EXAM: NUCLEAR MEDICINE HEPATOBILIARY IMAGING WITH GALLBLADDER EF TECHNIQUE: Sequential images of the abdomen were obtained out to 60 minutes following intravenous administration of radiopharmaceutical. After slow intravenous infusion of 1.32 micrograms Cholecystokinin, gallbladder ejection fraction was determined. RADIOPHARMACEUTICALS:  5.4 mCi Tc-63m Choletec IV COMPARISON:  None. FINDINGS: Prompt uptake and biliary excretion of activity by the liver is seen. Gallbladder activity is visualized, consistent with patency of cystic duct. Biliary activity passes into small bowel, consistent with patent common bile duct. CCK was administered but there was no response to CCK and radiotracer continued to accumulate in the gallbladder after CCK. (At 60 min, normal ejection fraction is greater than 40%.) IMPRESSION: Continued accumulation of radiotracer after CCK administration could be due to biliary dyskinesia, opioid use or chronic cholecystitis. Electronically Signed   By: Inge Rise M.D.   On: 12/10/2018 09:43   US Abdomen Limited Ruq  Result Date: 12/09/2018 CLINICAL DATA:  Right upper quadrant pain with nausea and  vomiting EXAM: ULTRASOUND ABDOMEN LIMITED RIGHT UPPER QUADRANT COMPARISON:  None. FINDINGS: Gallbladder: Within the gallbladder, there is an echogenic focus which moves in shadows consistent with cholelithiasis. The gallbladder wall is mildly thickened and mildly edematous. There is a slight degree of pericholecystic fluid. The patient is focally tender over the gallbladder. No sonographic Murphy sign noted by sonographer. Common bile duct: Diameter: 5 mm. No intrahepatic or extrahepatic biliary duct dilatation. Liver: No focal lesion identified. Liver echogenicity is increased. Liver contour is mildly lobular. Portal vein is patent on color Doppler imaging with normal direction of blood flow towards the liver. Other: None. IMPRESSION: 1. Cholelithiasis. Gallbladder wall is mildly  thickened and edematous with subtle pericholecystic fluid. Patient is tender over the gallbladder. These are findings concerning for acute cholecystitis. 2. Increase in liver echogenicity, a finding felt to represent a degree of hepatic steatosis. No focal liver lesions evident. Liver contour mildly lobular. There may be a degree of underlying hepatic cirrhosis. Appropriate laboratory assessment in this regard advised. Electronically Signed   By: Lowella Grip III M.D.   On: 12/09/2018 13:12        Scheduled Meds:  amLODipine  10 mg Oral Daily   enoxaparin (LOVENOX) injection  40 mg Subcutaneous Q24H   escitalopram  20 mg Oral Daily   insulin aspart  0-5 Units Subcutaneous QHS   insulin aspart  0-9 Units Subcutaneous TID WC   latanoprost  1 drop Both Eyes QHS   levothyroxine  75 mcg Oral QAC breakfast   metoprolol tartrate  50 mg Oral BID   mometasone-formoterol  2 puff Inhalation BID   pantoprazole  40 mg Oral Daily   Continuous Infusions:  lactated ringers 75 mL/hr at 12/10/18 1045     LOS: 1 day    Time spent: 30 minutes    Elsye Mccollister Darleen Crocker, DO [facility] Pager [phone]  If 7PM-7AM, please contact night-coverage [email or web address] Password [email or web address] 12/10/2018, 1:23 PM

## 2018-12-10 NOTE — Consult Note (Signed)
[place]  Reason for Consult: ? Acute cholecystitis  Referring Physician:  Dr. Manuella Ghazi, MD and [facility] ED   Chief Complaint    Abdominal Pain      [name] is a 68 y.o. female.  HPI: [name] is a 68 yo who the ED called me about yesterday due to concern for acute cholecystitis.  The patient has significant past medical history and multiple undiagnosed issues going on currently. She has a history of COPD, depression, diabetes (now off insulin), GERD, HTN, HLD, ? Possible scleroderma with prior pericarditis and reported ventricular stiffness, interstitial lung disease possibly from scleroderma, and a 100 lbs weight loss since 11/2017 based on documented weights in the chart (253 lbs 11/2017, 143 lbs 11/2018).  She has been referred to Rheumatology in [facility] and is suppose to go next week. She was also suppose to get an outpatient cardiac MRI today to evaluate her heart more.   She reports inability to keep down food except at breakfast with chronic nausea/vomiting. She has been constantly losing weight for unknown reasons. She presented to the ED yesterday with worsening epigastric/ RUQ pain that has been going on for years. She says the pain was worse yesterday and this prompted her coming to the hospital. She was worked up first with a Korea that demonstrated gallstones and possible thickening concern for possible acute cholecystitis, but she had no leukocytosis and is actually always leukopenic.  She cirrhotic changes to her liver on [place], and elevated transaminase and alkaline phosphatase but no elevated bilirubin.  Given her issues and history, I asked the Ed to admit her to the hospitalist and have cardiology risk stratify her as well as have a HIDA performed.  Her HIDA was delayed until this AM.    Currently she reports her pain is better, and she would like to eat. Her LFTs have trended down this AM.    She had an EGD 12/2017 that demonstrated a tortuous  esophagus, gastritis, H pylori positive and she believes she did get antibiotic treatment for that at the time.  Her last colonoscopy was in 2013 and she had 1 polyp removed. She was told to have another in 10 years.   Past Medical History:  Diagnosis Date  . Anterolisthesis    Grade 1, L4-5  . Bronchospasm 05/28/2012  . COPD (chronic obstructive pulmonary disease) ([place])   . DEGENERATIVE JOINT DISEASE 10/06/2006  . DEPRESSION 09/26/2008  . DIABETES MELLITUS 10/06/2006  . Diverticulosis    [id number]  . GERD (gastroesophageal reflux disease) 07/25/2013  . HYPERLIPIDEMIA 01/11/2009  . HYPERTENSION 10/06/2006  . Hypokalemia   . Hypomagnesemia   . INSOMNIA 09/26/2008  . Internal hemorrhoids   . OBSTRUCTIVE SLEEP APNEA 06/23/2008   Severe OSA per sleep study 2010, Rx a CPAP  . Pain in joint, multiple sites 11/10/2006  . Pericardial effusion   . Pericarditis   . UNSPECIFIED ANEMIA 12/10/2009  . UTI (urinary tract infection) 11/2017    Past Surgical History:  Procedure Laterality Date  . BIOPSY  12/28/2017   Procedure: BIOPSY;  Surgeon: Irving Copas., MD;  Location: [facility];  Service: Gastroenterology;;  . COLONOSCOPY  08/01/2011   Procedure: COLONOSCOPY;  Surgeon: Inda Castle, MD;  Location: [facility];  Service: Endoscopy;  Laterality: N/A;  . ESOPHAGOGASTRODUODENOSCOPY (EGD) WITH PROPOFOL N/A 12/28/2017   Procedure: ESOPHAGOGASTRODUODENOSCOPY (EGD) WITH PROPOFOL;  Surgeon: Rush Landmark Telford Nab., MD;  Location: [place] [facility];  Service: Gastroenterology;  Laterality:  N/A;  . LEFT HEART CATH AND CORONARY ANGIOGRAPHY N/A 10/22/2017   Procedure: LEFT HEART CATH AND CORONARY ANGIOGRAPHY;  Surgeon: Jettie Booze, MD;  Location: [facility];  Service: Cardiovascular;  Laterality: N/A;  . 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 Right knee replacement  2005    Family History  Problem Relation Age of Onset  . Asthma Mother   . Stroke Mother   . Diabetes Mother   .  Diabetes Other        M, B, S  . Hypertension Sister        M, S,B  . Pancreatic cancer Brother   . Colon cancer Neg Hx   . Prostate cancer Neg Hx   . Breast cancer Neg Hx   . Esophageal cancer Neg Hx   . Liver disease Neg Hx   . Rectal cancer Neg Hx   . Stomach cancer Neg Hx   . Inflammatory bowel disease Neg Hx     Social History   Tobacco Use  . Smoking status: Former Smoker    Packs/day: 0.20    Years: 4.00    Pack years: 0.80    Types: Cigarettes    Quit date: 05/13/1995    Years since quitting: 23.5  . Smokeless tobacco: Never Used  Substance Use Topics  . Alcohol use: Not Currently  . Drug use: No    Medications:  I have reviewed the patient's current medications. Prior to Admission:  Medications Prior to Admission  Medication Sig Dispense Refill Last Dose  . Accu-Chek Softclix Lancets lancets USE TO CHECK BLOOD SUGAR TWICE A DAY 200 each 12   . albuterol (VENTOLIN HFA) 108 (90 Base) MCG/ACT inhaler Inhale 2 puffs into the lungs every 4 (four) hours as needed for wheezing or shortness of breath. 18 g 5 12/08/2018 at Unknown time  . amLODipine (NORVASC) 10 MG tablet Take 1 tablet (10 mg total) by mouth daily. 90 tablet 3 12/08/2018 at Unknown time  . aspirin EC 81 MG tablet Take 1 tablet (81 mg total) by mouth daily. 90 tablet 3 12/08/2018 at Unknown time  . Blood Glucose Monitoring Suppl (ACCU-CHEK AVIVA) device Use as instructed to check blood sugar twice daily dx E11.9 1 each 0   . colchicine 0.6 MG tablet Take 1 tablet (0.6 mg total) by mouth daily. 30 tablet 1 12/08/2018 at Unknown time  . escitalopram (LEXAPRO) 20 MG tablet Take 1 tablet (20 mg total) by mouth daily. 90 tablet 1 12/08/2018 at Unknown time  . Fluticasone-Salmeterol (ADVAIR) 250-50 MCG/DOSE AEPB Inhale 2 puffs into the lungs daily. 60 each 5 12/08/2018 at Unknown time  . glucose blood (ACCU-CHEK AVIVA PLUS) test strip TEST BLOOD SUGAR TWICE DAILY AND LANCETS TWICE DAILY E11.9 200 each 11 12/09/2018 at  Unknown time  . latanoprost (XALATAN) 0.005 % ophthalmic solution Place 1 drop into both eyes at bedtime.   12/08/2018 at Unknown time  . levothyroxine (SYNTHROID) 75 MCG tablet Take 1 tablet (75 mcg total) by mouth daily before breakfast. 90 tablet 1 12/09/2018 at 0700  . metoprolol tartrate (LOPRESSOR) 25 MG tablet TAKE 2 TABLETS BY MOUTH TWICE DAILY (Patient taking differently: Take 50 mg by mouth 2 (two) times daily. TAKE 2 TABLETS BY MOUTH TWICE DAILY) 360 tablet 1 12/08/2018 at 1800  . OVER THE COUNTER MEDICATION CPAP: At bedtime     . OXYGEN Inhale 2 L into the lungs continuous.      . pantoprazole (PROTONIX) 40 MG  tablet Take 1 tablet (40 mg total) by mouth daily. 90 tablet 3 12/08/2018 at Unknown time  . potassium chloride SA (K-DUR) 20 MEQ tablet Take 1 tablet (20 mEq total) by mouth 2 (two) times daily. 180 tablet 1 12/08/2018 at Unknown time  . rosuvastatin (CRESTOR) 40 MG tablet Take 1 tablet (40 mg total) by mouth every evening. 90 tablet 1 12/08/2018 at Unknown time   Scheduled: . amLODipine  10 mg Oral Daily  . enoxaparin (LOVENOX) injection  40 mg Subcutaneous Q24H  . escitalopram  20 mg Oral Daily  . insulin aspart  0-5 Units Subcutaneous QHS  . insulin aspart  0-9 Units Subcutaneous TID WC  . latanoprost  1 drop Both Eyes QHS  . levothyroxine  75 mcg Oral QAC breakfast  . metoprolol tartrate  50 mg Oral BID  . mometasone-formoterol  2 puff Inhalation BID  . pantoprazole  40 mg Oral Daily   Continuous: . lactated ringers 75 mL/hr at 12/10/18 1332   JTT:SVXB & mag hydroxide-simeth, HYDROmorphone (DILAUDID) injection, ondansetron **OR** ondansetron (ZOFRAN) IV  No Known Allergies  ROS:  A comprehensive review of systems was negative except for: Constitutional: positive for weight loss Respiratory: positive for interstitial lung disease Cardiovascular: positive for chest pain and prior pericarditis/ effusion Gastrointestinal: positive for abdominal pain, nausea, vomiting and  no bloody Bms or changes in stool  Blood pressure (!) 144/91, pulse 74, temperature 98.2 F (36.8 C), temperature source Oral, resp. rate 15, height 5\' 9"  (1.753 m), weight 65 kg, SpO2 98 %. Physical Exam Vitals signs reviewed.  Constitutional:      General: She is not in acute distress.    Appearance: She is cachectic. She is not toxic-appearing.  HENT:     Head: Normocephalic.  Eyes:     Extraocular Movements: Extraocular movements intact.  Cardiovascular:     Rate and Rhythm: Normal rate and regular rhythm.  Pulmonary:     Effort: Pulmonary effort is normal.  Abdominal:     General: There is no distension.     Palpations: Abdomen is soft.     Tenderness: There is abdominal tenderness in the right upper quadrant and epigastric area. There is no guarding or rebound.     Comments: Excess skin from weight loss  Musculoskeletal:     Comments: Lower ext without edema, moves all extremities  Skin:    General: Skin is warm and dry.  Neurological:     General: No focal deficit present.     Mental Status: She is alert and oriented to person, place, and time.  Psychiatric:        Mood and Affect: Mood normal.        Behavior: Behavior normal.     Results: Results for orders placed or performed during the hospital encounter of 12/09/18 (from the past 48 hour(s))  CBC with Differential     Status: Abnormal   Collection Time: 12/09/18  9:23 AM  Result Value Ref Range   WBC 5.1 4.0 - 10.5 K/uL   RBC 4.43 3.87 - 5.11 MIL/uL   Hemoglobin 11.8 (L) 12.0 - 15.0 g/dL   HCT 37.0 36.0 - 46.0 %   MCV 83.5 80.0 - 100.0 fL   MCH 26.6 26.0 - 34.0 pg   MCHC 31.9 30.0 - 36.0 g/dL   RDW 16.7 (H) 11.5 - 15.5 %   Platelets 285 150 - 400 K/uL   nRBC 0.0 0.0 - 0.2 %   Neutrophils Relative % 84 %  Neutro Abs 4.3 1.7 - 7.7 K/uL   Lymphocytes Relative 10 %   Lymphs Abs 0.5 (L) 0.7 - 4.0 K/uL   Monocytes Relative 6 %   Monocytes Absolute 0.3 0.1 - 1.0 K/uL   Eosinophils Relative 0 %    Eosinophils Absolute 0.0 0.0 - 0.5 K/uL   Basophils Relative 0 %   Basophils Absolute 0.0 0.0 - 0.1 K/uL   Immature Granulocytes 0 %   Abs Immature Granulocytes 0.01 0.00 - 0.07 K/uL    Comment: Performed at [facility], [address], [place], [place] [address]  Comprehensive metabolic panel     Status: Abnormal   Collection Time: 12/09/18  9:23 AM  Result Value Ref Range   Sodium 137 135 - 145 mmol/L   Potassium 3.8 3.5 - 5.1 mmol/L   Chloride 103 98 - 111 mmol/L   CO2 24 22 - 32 mmol/L   Glucose, Bld 89 70 - 99 mg/dL   BUN 12 8 - 23 mg/dL   Creatinine, Ser 0.68 0.44 - 1.00 mg/dL   Calcium 9.0 8.9 - 10.3 mg/dL   Total Protein 8.9 (H) 6.5 - 8.1 g/dL   Albumin 3.9 3.5 - 5.0 g/dL   AST 846 (H) 15 - 41 U/L   ALT 194 (H) 0 - 44 U/L   Alkaline Phosphatase 362 (H) 38 - 126 U/L   Total Bilirubin 1.2 0.3 - 1.2 mg/dL   GFR calc non Af Amer >60 >60 mL/min   GFR calc Af Amer >60 >60 mL/min   Anion gap 10 5 - 15    Comment: Performed at [facility], [address], [place], [place] [address]  Lipase, blood     Status: None   Collection Time: 12/09/18  9:23 AM  Result Value Ref Range   Lipase 45 11 - 51 U/L    Comment: Performed at [facility], [address], [place], [place] [address]  Urinalysis, Routine w reflex microscopic     Status: Abnormal   Collection Time: 12/09/18  9:23 AM  Result Value Ref Range   Color, Urine YELLOW YELLOW   APPearance CLEAR CLEAR   Specific Gravity, Urine 1.009 1.005 - 1.030   pH 6.0 5.0 - 8.0   Glucose, UA NEGATIVE NEGATIVE mg/dL   Hgb urine dipstick NEGATIVE NEGATIVE   Bilirubin Urine NEGATIVE NEGATIVE   Ketones, ur NEGATIVE NEGATIVE mg/dL   Protein, ur NEGATIVE NEGATIVE mg/dL   Nitrite NEGATIVE NEGATIVE   Leukocytes,Ua SMALL (A) NEGATIVE   RBC / HPF 0-5 0 - 5 RBC/hpf   WBC, UA 0-5 0 - 5 WBC/hpf   Bacteria, UA RARE (A) NONE SEEN   Squamous Epithelial / LPF 0-5 0 - 5    Comment: Performed at [facility], [address], [place], [place]  [address]  HIV antibody (Routine Testing)     Status: None   Collection Time: 12/09/18  9:23 AM  Result Value Ref Range   HIV Screen 4th Generation wRfx Non Reactive Non Reactive    Comment: (NOTE) Performed At: [facility] [place], [place] [id number] Rush Farmer MD VO:[id number]   Hemoglobin A1c     Status: None   Collection Time: 12/09/18  9:23 AM  Result Value Ref Range   Hgb A1c MFr Bld 5.5 4.8 - 5.6 %    Comment: REPEATED TO VERIFY (NOTE) Pre diabetes:          5.7%-6.4% Diabetes:              >  6.4% Glycemic control for   <7.0% adults with diabetes    Mean Plasma Glucose 111.15 mg/dL    Comment: Performed at [facility] [address], [place], [place] [address]  SARS Coronavirus 2 (CEPHEID - Performed in [place] hospital lab), Hosp Order     Status: None   Collection Time: 12/09/18  1:35 PM   Specimen: Nasopharyngeal Swab  Result Value Ref Range   SARS Coronavirus 2 NEGATIVE NEGATIVE    Comment: (NOTE) If result is NEGATIVE SARS-CoV-2 target nucleic acids are NOT DETECTED. The SARS-CoV-2 RNA is generally detectable in upper and lower  respiratory specimens during the acute phase of infection. The lowest  concentration of SARS-CoV-2 viral copies this assay can detect is 250  copies / mL. A negative result does not preclude SARS-CoV-2 infection  and should not be used as the sole basis for treatment or other  patient management decisions.  A negative result may occur with  improper specimen collection / handling, submission of specimen other  than nasopharyngeal swab, presence of viral mutation(s) within the  areas targeted by this assay, and inadequate number of viral copies  (<250 copies / mL). A negative result must be combined with clinical  observations, patient history, and epidemiological information. If result is POSITIVE SARS-CoV-2 target nucleic acids are DETECTED. The SARS-CoV-2 RNA is generally detectable in upper and  lower  respiratory specimens dur ing the acute phase of infection.  Positive  results are indicative of active infection with SARS-CoV-2.  Clinical  correlation with patient history and other diagnostic information is  necessary to determine patient infection status.  Positive results do  not rule out bacterial infection or co-infection with other viruses. If result is PRESUMPTIVE POSTIVE SARS-CoV-2 nucleic acids MAY BE PRESENT.   A presumptive positive result was obtained on the submitted specimen  and confirmed on repeat testing.  While 2019 novel coronavirus  (SARS-CoV-2) nucleic acids may be present in the submitted sample  additional confirmatory testing may be necessary for epidemiological  and / or clinical management purposes  to differentiate between  SARS-CoV-2 and other Sarbecovirus currently known to infect humans.  If clinically indicated additional testing with an alternate test  methodology [phone]) is advised. The SARS-CoV-2 RNA is generally  detectable in upper and lower respiratory sp ecimens during the acute  phase of infection. The expected result is Negative. Fact Sheet for Patients:  StrictlyIdeas.no Fact Sheet for Healthcare Providers: BankingDealers.co.za This test is not yet approved or cleared by the [place] FDA and has been authorized for detection and/or diagnosis of SARS-CoV-2 by FDA under an Emergency Use Authorization (EUA).  This EUA will remain in effect (meaning this test can be used) for the duration of the COVID-19 declaration under Section 564(b)(1) of the Act, 21 U.S.C. section 360bbb-3(b)(1), unless the authorization is terminated or revoked sooner. Performed at [facility], [address] [place], [place] [address]   CBG monitoring, ED     Status: Abnormal   Collection Time: 12/09/18  5:30 PM  Result Value Ref Range   Glucose-Capillary 43 (LL) 70 - 99 mg/dL  CBG monitoring, ED      Status: Abnormal   Collection Time: 12/09/18  6:10 PM  Result Value Ref Range   Glucose-Capillary 54 (L) 70 - 99 mg/dL  CBG monitoring, ED     Status: Abnormal   Collection Time: 12/09/18  6:56 PM  Result Value Ref Range   Glucose-Capillary 167 (H) 70 - 99 mg/dL  Glucose, capillary     Status: Abnormal   Collection Time: 12/09/18  9:28 PM  Result Value Ref Range   Glucose-Capillary 119 (H) 70 - 99 mg/dL  Comprehensive metabolic panel     Status: Abnormal   Collection Time: 12/10/18  5:02 AM  Result Value Ref Range   Sodium 136 135 - 145 mmol/L   Potassium 3.8 3.5 - 5.1 mmol/L   Chloride 105 98 - 111 mmol/L   CO2 24 22 - 32 mmol/L   Glucose, Bld 93 70 - 99 mg/dL   BUN 11 8 - 23 mg/dL   Creatinine, Ser 0.76 0.44 - 1.00 mg/dL   Calcium 8.9 8.9 - 10.3 mg/dL   Total Protein 7.6 6.5 - 8.1 g/dL   Albumin 3.2 (L) 3.5 - 5.0 g/dL   AST 350 (H) 15 - 41 U/L   ALT 269 (H) 0 - 44 U/L   Alkaline Phosphatase 356 (H) 38 - 126 U/L   Total Bilirubin 1.0 0.3 - 1.2 mg/dL   GFR calc non Af Amer >60 >60 mL/min   GFR calc Af Amer >60 >60 mL/min   Anion gap 7 5 - 15    Comment: Performed at [facility], [address], [place], [place] [address]  CBC     Status: Abnormal   Collection Time: 12/10/18  5:02 AM  Result Value Ref Range   WBC 2.0 (L) 4.0 - 10.5 K/uL   RBC 3.86 (L) 3.87 - 5.11 MIL/uL   Hemoglobin 10.4 (L) 12.0 - 15.0 g/dL   HCT 31.5 (L) 36.0 - 46.0 %   MCV 81.6 80.0 - 100.0 fL   MCH 26.9 26.0 - 34.0 pg   MCHC 33.0 30.0 - 36.0 g/dL   RDW 16.4 (H) 11.5 - 15.5 %   Platelets 250 150 - 400 K/uL   nRBC 0.0 0.0 - 0.2 %    Comment: Performed at [facility], [address] [place], [place] [address]   Personally Reviewed [place] and HIDA- [place] with gallbladder with stones, minimal thickening, HIDA- gallbladder fills  US Abdomen Limited Ruq  Result Date: 12/09/2018 CLINICAL DATA:  Right upper quadrant pain with nausea and vomiting EXAM: ULTRASOUND ABDOMEN LIMITED RIGHT UPPER QUADRANT COMPARISON:   None. FINDINGS: Gallbladder: Within the gallbladder, there is an echogenic focus which moves in shadows consistent with cholelithiasis. The gallbladder wall is mildly thickened and mildly edematous. There is a slight degree of pericholecystic fluid. The patient is focally tender over the gallbladder. No sonographic Murphy sign noted by sonographer. Common bile duct: Diameter: 5 mm. No intrahepatic or extrahepatic biliary duct dilatation. Liver: No focal lesion identified. Liver echogenicity is increased. Liver contour is mildly lobular. Portal vein is patent on color Doppler imaging with normal direction of blood flow towards the liver. Other: None. IMPRESSION: 1. Cholelithiasis. Gallbladder wall is mildly thickened and edematous with subtle pericholecystic fluid. Patient is tender over the gallbladder. These are findings concerning for acute cholecystitis. 2. Increase in liver echogenicity, a finding felt to represent a degree of hepatic steatosis. No focal liver lesions evident. Liver contour mildly lobular. There may be a degree of underlying hepatic cirrhosis. Appropriate laboratory assessment in this regard advised. Electronically Signed   By: Lowella Grip III M.D.   On: 12/09/2018 13:12    Assessment & Plan:  CHEYENE HAMRIC is a 68 y.o. 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  She is leukopenic chronically. Her LFTs are elevated but this is mostly the transaminase and no bilirubin elevation. I am suspicious that more of this could be from fatty liver/ cirrhosis than gallbladder etiology.  I discussed this with the patient and her son Dannielle Huh via phone. I explained to them my  worries about her health and the risk of surgery for her being elevated. She very well may have some degree of chronic cholecystitis, but I am worried if the risk of surgery outweigh the possible benefit of removing the gallbladder given that I cannot be sure it is causing the symptoms.  I am suspicious that her esophagus could be contributing more to her weight loss and vomiting.     -Consult GI for them to weigh in on LFTs, ? Esophagus  -Appreciate cardiology looking at patient's record and summarizing the workup, which included cardiac cath 6/19 that showed no CAD and ECHO 10/2018 that showed EF 60%  -Diet ordered  -Trend LFTs, po trial, will see how she does and determine if she can continue her outpatient workup and discuss need for cholecystectomy as an outpatient versus needing a more urgent removal if symptoms do not improve   All questions were answered to the satisfaction of the patient and family.  Discussed with Dr. Manuella Ghazi and Walden Field, NP.   Total visit time > 70 minutes. I spent > 50% of that time discussing the above with the patient and her son. In addition, I discussed the case with Dr. Manuella Ghazi and Walden Field, NP with GI and reviewed her chart extensively.   Virl Cagey 12/10/2018, 7:40 AM

## 2018-12-10 NOTE — Progress Notes (Signed)
Inpatient Diabetes Program Recommendations  AACE/ADA: New Consensus Statement on Inpatient Glycemic Control (2015)  Target Ranges:  Prepandial:   less than 140 mg/dL      Peak postprandial:   less than 180 mg/dL (1-2 hours)      Critically ill patients:  140 - 180 mg/dL   Lab Results  Component Value Date   GLUCAP 69 (L) 12/10/2018   HGBA1C 5.5 12/09/2018    Review of Glycemic Control  Diabetes history: DM2 Outpatient Diabetes medications: None Current orders for Inpatient glycemic control: Novolog sensitive correction tid + hs 0-5 units  Inpatient Diabetes Program Recommendations:   Please consider decrease in Novolog correction scale to: -Custom Novolog correction scale 0-5 units       151-200  1 unit      201-250  2 units      251-300  3 units      301-350  4 units      351-400  5 units  Thank you, Melissa Roys E. Stepheny Canal, RN, MSN, CDE  Diabetes Coordinator Inpatient Glycemic Control Team Team Pager [phone] (8am-5pm) 12/10/2018 10:58 AM

## 2018-12-11 LAB — CBC
HCT: 32.3 % — ABNORMAL LOW (ref 36.0–46.0)
Hemoglobin: 10.4 g/dL — ABNORMAL LOW (ref 12.0–15.0)
MCH: 26.4 pg (ref 26.0–34.0)
MCHC: 32.2 g/dL (ref 30.0–36.0)
MCV: 82 fL (ref 80.0–100.0)
Platelets: 275 10*3/uL (ref 150–400)
RBC: 3.94 MIL/uL (ref 3.87–5.11)
RDW: 16.4 % — ABNORMAL HIGH (ref 11.5–15.5)
WBC: 2 10*3/uL — ABNORMAL LOW (ref 4.0–10.5)
nRBC: 0 % (ref 0.0–0.2)

## 2018-12-11 LAB — COMPREHENSIVE METABOLIC PANEL
ALT: 152 U/L — ABNORMAL HIGH (ref 0–44)
AST: 94 U/L — ABNORMAL HIGH (ref 15–41)
Albumin: 3.3 g/dL — ABNORMAL LOW (ref 3.5–5.0)
Alkaline Phosphatase: 273 U/L — ABNORMAL HIGH (ref 38–126)
Anion gap: 9 (ref 5–15)
BUN: 8 mg/dL (ref 8–23)
CO2: 25 mmol/L (ref 22–32)
Calcium: 8.9 mg/dL (ref 8.9–10.3)
Chloride: 101 mmol/L (ref 98–111)
Creatinine, Ser: 0.69 mg/dL (ref 0.44–1.00)
GFR calc Af Amer: >60 mL/min (ref >60)
GFR calc non Af Amer: >60 mL/min (ref >60)
Glucose, Bld: 89 mg/dL (ref 70–99)
Potassium: 3.6 mmol/L (ref 3.5–5.1)
Sodium: 135 mmol/L (ref 135–145)
Total Bilirubin: 0.7 mg/dL (ref 0.3–1.2)
Total Protein: 7.8 g/dL (ref 6.5–8.1)

## 2018-12-11 LAB — GLUCOSE, CAPILLARY
Glucose-Capillary: 81 mg/dL (ref 70–99)
Glucose-Capillary: 109 mg/dL — ABNORMAL HIGH (ref 70–99)

## 2018-12-11 MED ORDER — ALUM & MAG HYDROXIDE-SIMETH 200-200-20 MG/5ML PO SUSP: 30.0000 mL | ORAL | 0 refills | Status: AC | PRN

## 2018-12-11 MED ORDER — PANTOPRAZOLE SODIUM 40 MG PO TBEC: 40.0000 mg | DELAYED_RELEASE_TABLET | Freq: Two times a day (BID) | ORAL | 3 refills | Status: DC

## 2018-12-11 MED ORDER — ONDANSETRON HCL 4 MG PO TABS: 4.0000 mg | ORAL_TABLET | Freq: Four times a day (QID) | ORAL | 0 refills | Status: DC | PRN

## 2018-12-11 NOTE — Discharge Summary (Addendum)
Physician Discharge Summary  [name]:[id number] DOB: 02/22/1951 DOA: 12/09/2018  PCP: Colon Branch, MD  Admit date: 12/09/2018  Discharge date: 12/11/2018  Admitted From:Home  Disposition:  Home  Recommendations for Outpatient Follow-up:  1. Follow up with PCP in 1-2 weeks 2. Obtain repeat CMP in 1 week and get reassessment by Dr. Gala Romney with gastroenterology 3. Reevaluation for elective lap chole with Dr. Constance Haw, please call office on 8/3 to schedule follow-up appointment 4. Continue to remain on PPI twice daily as prescribed 5. Zofran given for as needed nausea or vomiting as well as Maalox for indigestion 6. Remain off statin medication until repeat CMP noted and LFTs are noted to be downtrending  Home Health: None  Equipment/Devices: None  Discharge Condition: Stable  CODE STATUS: Full  Diet recommendation: Heart Healthy/carb modified  Brief/Interim Summary: Per HPI: [name] a 68 y.o.femalewith medical history significant forCOPD, depression, diabetes, GERD, hypertension, dyslipidemia, questionable scleroderma, and recent 2D echocardiogram 6/22 with increased ventricular stiffness, who began having severe epigastric abdominal pain which started shortly after awakening this morning around 6 AM. She states that she has been having some ongoing, intermittent nausea and vomiting over the last several months and has had overall poor oral intake. She denies any hematemesis and does states she has some mild diarrhea. She states that she had pain earlier on arrival to the ED 8/10, but this has now improved. No vomiting since arrival noted. She denies any chest pain or shortness of breath. Patient has had a pericardial effusion noted on recent 2D echocardiogram and was due to have cardiac MRI in a.m.  Patient was admitted with suspected acute cholecystitis with associated transaminitis.  She has undergone HIDA scan on 7/31 with demonstrated findings of  chronic cholecystitis and has been seen by general surgery for evaluation for laparoscopic cholecystectomy.  It appears that patient has been having ongoing weight loss for several months now and has had prior EGD demonstrating possible esophageal issues related to scleroderma which is being investigated.  It is currently unclear what the exact source of her epigastric abdominal pain and a laparoscopic cholecystectomy is not currently indicated per general surgery.  She was taken off her IV Zosyn on 7/31 and on the morning of the day of discharge states that she is feeling overall much better and was able to finish her full breakfast tray without any nausea or vomiting or abdominal pain.  Her HIDA scan did not demonstrate an acute cholecystitis and it appeared to be more chronic in nature. Her LFTs are downtrending and per GI, it appears that patient may have had some choledocholithiasis which has now resolved.  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  She is, however stable for discharge at this point in time and will need close follow-up with repeat CMP in 1 week with GI evaluation as well as repeat general surgical evaluation in the outpatient setting.  She also requires close follow-up with rheumatology to evaluate and manage her scleroderma.  No other acute events otherwise noted during the course of this admission.  Discharge Diagnoses:  Active Problems:   Acute cholecystitis   Upper abdominal pain   Elevated transaminase level   Hepatic cirrhosis (HCC)   Nausea and vomiting    Discharge Instructions  Discharge Instructions    Diet - low sodium heart  healthy   Complete by: As directed    Increase activity slowly   Complete by: As directed      Allergies as of 12/11/2018   No Known Allergies     Medication List     STOP taking these medications   rosuvastatin 40 MG tablet Commonly known as: CRESTOR     TAKE these medications   Accu-Chek Aviva device Use as instructed to check blood sugar twice daily dx E11.9   Accu-Chek Aviva Plus test strip Generic drug: glucose blood TEST BLOOD SUGAR TWICE DAILY AND LANCETS TWICE DAILY E11.9   Accu-Chek Softclix Lancets lancets USE TO CHECK BLOOD SUGAR TWICE A DAY   albuterol 108 (90 Base) MCG/ACT inhaler Commonly known as: VENTOLIN HFA Inhale 2 puffs into the lungs every 4 (four) hours as needed for wheezing or shortness of breath.   alum & mag hydroxide-simeth 200-200-20 MG/5ML suspension Commonly known as: MAALOX/MYLANTA Take 30 mLs by mouth every 4 (four) hours as needed for indigestion.   amLODipine 10 MG tablet Commonly known as: NORVASC Take 1 tablet (10 mg total) by mouth daily.   aspirin EC 81 MG tablet Take 1 tablet (81 mg total) by mouth daily.   colchicine 0.6 MG tablet Take 1 tablet (0.6 mg total) by mouth daily.   escitalopram 20 MG tablet Commonly known as: Lexapro Take 1 tablet (20 mg total) by mouth daily.   Fluticasone-Salmeterol 250-50 MCG/DOSE Aepb Commonly known as: ADVAIR Inhale 2 puffs into the lungs daily.   latanoprost 0.005 % ophthalmic solution Commonly known as: XALATAN Place 1 drop into both eyes at bedtime.   levothyroxine 75 MCG tablet Commonly known as: SYNTHROID Take 1 tablet (75 mcg total) by mouth daily before breakfast.   metoprolol tartrate 25 MG tablet Commonly known as: LOPRESSOR TAKE 2 TABLETS BY MOUTH TWICE DAILY What changed:   how much to take  how to take this  when to take this   ondansetron 4 MG tablet Commonly known as: ZOFRAN Take 1 tablet (4 mg total) by mouth every 6 (six) hours as needed for nausea.   OVER THE COUNTER MEDICATION CPAP: At bedtime   OXYGEN Inhale 2 L into the lungs continuous.   pantoprazole 40 MG tablet Commonly known as: PROTONIX Take 1 tablet (40  mg total) by mouth 2 (two) times daily before a meal. What changed: when to take this   potassium chloride SA 20 MEQ tablet Commonly known as: K-DUR Take 1 tablet (20 mEq total) by mouth 2 (two) times daily.      Follow-up Information    Colon Branch, MD. Schedule an appointment as soon as possible for a visit in 2 day(s).   Specialty: Internal Medicine Why: Call office on 8/3 to arrange follow up appointment for repeat lab work. Contact information: [address] [place] [address] [phone]        Sueanne Margarita, MD .   Specialty: Cardiology Contact information: [address] [place] [address] [phone]        Virl Cagey, MD. Schedule an appointment as soon as possible for a visit in 2 day(s).   Specialty: General Surgery Why: Call office on 8/3 to arrange follow up appointment. Contact information: [address] [place] [facility] [address] [phone]        Daneil Dolin, MD Follow up in 1 week(s).   Specialty: Gastroenterology Why: Call office on 8/3 to arrange follow up appointment  to reassess and follow up CMP.  Contact information: [address] [place] [address] [phone]          No Known Allergies  Consultations:  General surgery  GI   Procedures/Studies: Nm Hepato W/eject Fract  Result Date: 12/10/2018 CLINICAL DATA:  Abdominal pain, nausea and vomiting for 3 years. EXAM: NUCLEAR MEDICINE HEPATOBILIARY IMAGING WITH GALLBLADDER EF TECHNIQUE: Sequential images of the abdomen were obtained out to 60 minutes following intravenous administration of radiopharmaceutical. After slow intravenous infusion of 1.32 micrograms Cholecystokinin, gallbladder ejection fraction was determined. RADIOPHARMACEUTICALS:  5.4 mCi Tc-77m Choletec IV COMPARISON:  None. FINDINGS: Prompt uptake and biliary excretion of activity by the liver is seen. Gallbladder activity is visualized, consistent with  patency of cystic duct. Biliary activity passes into small bowel, consistent with patent common bile duct. CCK was administered but there was no response to CCK and radiotracer continued to accumulate in the gallbladder after CCK. (At 60 min, normal ejection fraction is greater than 40%.) IMPRESSION: Continued accumulation of radiotracer after CCK administration could be due to biliary dyskinesia, opioid use or chronic cholecystitis. Electronically Signed   By: Inge Rise M.D.   On: 12/10/2018 09:43   US Abdomen Limited Ruq  Result Date: 12/09/2018 CLINICAL DATA:  Right upper quadrant pain with nausea and vomiting EXAM: ULTRASOUND ABDOMEN LIMITED RIGHT UPPER QUADRANT COMPARISON:  None. FINDINGS: Gallbladder: Within the gallbladder, there is an echogenic focus which moves in shadows consistent with cholelithiasis. The gallbladder wall is mildly thickened and mildly edematous. There is a slight degree of pericholecystic fluid. The patient is focally tender over the gallbladder. No sonographic Murphy sign noted by sonographer. Common bile duct: Diameter: 5 mm. No intrahepatic or extrahepatic biliary duct dilatation. Liver: No focal lesion identified. Liver echogenicity is increased. Liver contour is mildly lobular. Portal vein is patent on color Doppler imaging with normal direction of blood flow towards the liver. Other: None. IMPRESSION: 1. Cholelithiasis. Gallbladder wall is mildly thickened and edematous with subtle pericholecystic fluid. Patient is tender over the gallbladder. These are findings concerning for acute cholecystitis. 2. Increase in liver echogenicity, a finding felt to represent a degree of hepatic steatosis. No focal liver lesions evident. Liver contour mildly lobular. There may be a degree of underlying hepatic cirrhosis. Appropriate laboratory assessment in this regard advised. Electronically Signed   By: Lowella Grip III M.D.   On: 12/09/2018 13:12     Discharge Exam: Vitals:    12/11/18 0932 12/11/18 1029  BP:  (!) 148/92  Pulse:    Resp:    Temp:    SpO2: 98%    Vitals:   12/10/18 2134 12/11/18 0518 12/11/18 0932 12/11/18 1029  BP: (!) 151/94 (!) 166/95  (!) 148/92  Pulse: 69 69    Resp:  17    Temp: 98.3 F (36.8 C) 98.4 F (36.9 C)    TempSrc: Oral Oral    SpO2: 98% 100% 98%   Weight:      Height:        General: Pt is alert, awake, not in acute distress Cardiovascular: RRR, S1/S2 +, no rubs, no gallops Respiratory: CTA bilaterally, no wheezing, no rhonchi Abdominal: Soft, NT, ND, bowel sounds + Extremities: no edema, no cyanosis    The results of significant diagnostics from this hospitalization (including imaging, microbiology, ancillary and laboratory) are listed below for reference.     Microbiology: Recent Results (from the past 240 hour(s))  SARS Coronavirus 2 (CEPHEID - Performed in [facility]  hospital lab), Hosp Order     Status: None   Collection Time: 12/09/18  1:35 PM   Specimen: Nasopharyngeal Swab  Result Value Ref Range Status   SARS Coronavirus 2 NEGATIVE NEGATIVE Final    Comment: (NOTE) If result is NEGATIVE SARS-CoV-2 target nucleic acids are NOT DETECTED. The SARS-CoV-2 RNA is generally detectable in upper and lower  respiratory specimens during the acute phase of infection. The lowest  concentration of SARS-CoV-2 viral copies this assay can detect is 250  copies / mL. A negative result does not preclude SARS-CoV-2 infection  and should not be used as the sole basis for treatment or other  patient management decisions.  A negative result may occur with  improper specimen collection / handling, submission of specimen other  than nasopharyngeal swab, presence of viral mutation(s) within the  areas targeted by this assay, and inadequate number of viral copies  (<250 copies / mL). A negative result must be combined with clinical  observations, patient history, and epidemiological information. If result is  POSITIVE SARS-CoV-2 target nucleic acids are DETECTED. The SARS-CoV-2 RNA is generally detectable in upper and lower  respiratory specimens dur ing the acute phase of infection.  Positive  results are indicative of active infection with SARS-CoV-2.  Clinical  correlation with patient history and other diagnostic information is  necessary to determine patient infection status.  Positive results do  not rule out bacterial infection or co-infection with other viruses. If result is PRESUMPTIVE POSTIVE SARS-CoV-2 nucleic acids MAY BE PRESENT.   A presumptive positive result was obtained on the submitted specimen  and confirmed on repeat testing.  While 2019 novel coronavirus  (SARS-CoV-2) nucleic acids may be present in the submitted sample  additional confirmatory testing may be necessary for epidemiological  and / or clinical management purposes  to differentiate between  SARS-CoV-2 and other Sarbecovirus currently known to infect humans.  If clinically indicated additional testing with an alternate test  methodology [phone]) is advised. The SARS-CoV-2 RNA is generally  detectable in upper and lower respiratory sp ecimens during the acute  phase of infection. The expected result is Negative. Fact Sheet for Patients:  StrictlyIdeas.no Fact Sheet for Healthcare Providers: BankingDealers.co.za This test is not yet approved or cleared by the [place] FDA and has been authorized for detection and/or diagnosis of SARS-CoV-2 by FDA under an Emergency Use Authorization (EUA).  This EUA will remain in effect (meaning this test can be used) for the duration of the COVID-19 declaration under Section 564(b)(1) of the Act, 21 U.S.C. section 360bbb-3(b)(1), unless the authorization is terminated or revoked sooner. Performed at [facility], [address] [place], [place] [address]      Labs: BNP (last 3 results) Recent Labs    12/21/17 2142  12/27/17 1046 05/01/18 1545  BNP 406.6* 278.7* 782.4*   Basic Metabolic Panel: Recent Labs  Lab 12/09/18 0923 12/10/18 0502 12/11/18 0608  NA 137 136 135  K 3.8 3.8 3.6  CL 103 105 101  CO2 24 24 25   GLUCOSE 89 93 89  BUN 12 11 8   CREATININE 0.68 0.76 0.69  CALCIUM 9.0 8.9 8.9   Liver Function Tests: Recent Labs  Lab 12/09/18 0923 12/10/18 0502 12/11/18 0608  AST 846* 350* 94*  ALT 194* 269* 152*  ALKPHOS 362* 356* 273*  BILITOT 1.2 1.0 0.7  PROT 8.9* 7.6 7.8  ALBUMIN 3.9 3.2* 3.3*   Recent Labs  Lab 12/09/18 0923  LIPASE 45   No  results for input(s): AMMONIA in the last 168 hours. CBC: Recent Labs  Lab 12/09/18 0923 12/10/18 0502 12/11/18 0608  WBC 5.1 2.0* 2.0*  NEUTROABS 4.3  --   --   HGB 11.8* 10.4* 10.4*  HCT 37.0 31.5* 32.3*  MCV 83.5 81.6 82.0  PLT 285 250 275   Cardiac Enzymes: No results for input(s): CKTOTAL, CKMB, CKMBINDEX, TROPONINI in the last 168 hours. BNP: Invalid input(s): POCBNP CBG: Recent Labs  Lab 12/10/18 1237 12/10/18 1713 12/10/18 2153 12/11/18 0726 12/11/18 1108  GLUCAP 85 97 104* 81 109*   D-Dimer No results for input(s): DDIMER in the last 72 hours. Hgb A1c Recent Labs    12/09/18 0923  HGBA1C 5.5   Lipid Profile No results for input(s): CHOL, HDL, LDLCALC, TRIG, CHOLHDL, LDLDIRECT in the last 72 hours. Thyroid function studies No results for input(s): TSH, T4TOTAL, T3FREE, THYROIDAB in the last 72 hours.  Invalid input(s): FREET3 Anemia work up No results for input(s): VITAMINB12, FOLATE, FERRITIN, TIBC, IRON, RETICCTPCT in the last 72 hours. Urinalysis    Component Value Date/Time   COLORURINE YELLOW 12/09/2018 0923   APPEARANCEUR CLEAR 12/09/2018 0923   LABSPEC 1.009 12/09/2018 0923   PHURINE 6.0 12/09/2018 0923   GLUCOSEU NEGATIVE 12/09/2018 0923   GLUCOSEU NEGATIVE 11/30/2013 1057   HGBUR NEGATIVE 12/09/2018 0923   BILIRUBINUR NEGATIVE 12/09/2018 0923   KETONESUR NEGATIVE 12/09/2018 0923    PROTEINUR NEGATIVE 12/09/2018 0923   UROBILINOGEN 0.2 11/30/2013 1057   NITRITE NEGATIVE 12/09/2018 0923   LEUKOCYTESUR SMALL (A) 12/09/2018 0923   Sepsis Labs Invalid input(s): PROCALCITONIN,  WBC,  LACTICIDVEN Microbiology Recent Results (from the past 240 hour(s))  SARS Coronavirus 2 (CEPHEID - Performed in [facility] hospital lab), Hosp Order     Status: None   Collection Time: 12/09/18  1:35 PM   Specimen: Nasopharyngeal Swab  Result Value Ref Range Status   SARS Coronavirus 2 NEGATIVE NEGATIVE Final    Comment: (NOTE) If result is NEGATIVE SARS-CoV-2 target nucleic acids are NOT DETECTED. The SARS-CoV-2 RNA is generally detectable in upper and lower  respiratory specimens during the acute phase of infection. The lowest  concentration of SARS-CoV-2 viral copies this assay can detect is 250  copies / mL. A negative result does not preclude SARS-CoV-2 infection  and should not be used as the sole basis for treatment or other  patient management decisions.  A negative result may occur with  improper specimen collection / handling, submission of specimen other  than nasopharyngeal swab, presence of viral mutation(s) within the  areas targeted by this assay, and inadequate number of viral copies  (<250 copies / mL). A negative result must be combined with clinical  observations, patient history, and epidemiological information. If result is POSITIVE SARS-CoV-2 target nucleic acids are DETECTED. The SARS-CoV-2 RNA is generally detectable in upper and lower  respiratory specimens dur ing the acute phase of infection.  Positive  results are indicative of active infection with SARS-CoV-2.  Clinical  correlation with patient history and other diagnostic information is  necessary to determine patient infection status.  Positive results do  not rule out bacterial infection or co-infection with other viruses. If result is PRESUMPTIVE POSTIVE SARS-CoV-2 nucleic acids MAY BE PRESENT.    A presumptive positive result was obtained on the submitted specimen  and confirmed on repeat testing.  While 2019 novel coronavirus  (SARS-CoV-2) nucleic acids may be present in the submitted sample  additional confirmatory testing may be necessary for epidemiological  and / or clinical management purposes  to differentiate between  SARS-CoV-2 and other Sarbecovirus currently known to infect humans.  If clinically indicated additional testing with an alternate test  methodology [phone]) is advised. The SARS-CoV-2 RNA is generally  detectable in upper and lower respiratory sp ecimens during the acute  phase of infection. The expected result is Negative. Fact Sheet for Patients:  StrictlyIdeas.no Fact Sheet for Healthcare Providers: BankingDealers.co.za This test is not yet approved or cleared by the [place] FDA and has been authorized for detection and/or diagnosis of SARS-CoV-2 by FDA under an Emergency Use Authorization (EUA).  This EUA will remain in effect (meaning this test can be used) for the duration of the COVID-19 declaration under Section 564(b)(1) of the Act, 21 U.S.C. section 360bbb-3(b)(1), unless the authorization is terminated or revoked sooner. Performed at [facility], [address], [place], [place] [address]      Time coordinating discharge: 35 minutes  SIGNED:   Rodena Goldmann, DO [facility] 12/11/2018, 12:23 PM  If 7PM-7AM, please contact night-coverage [email or web address] Password [email or web address]

## 2018-12-11 NOTE — Plan of Care (Signed)

## 2018-12-11 NOTE — Progress Notes (Signed)
Patient states abdominal pain essentially resolved today.  Ate nearly all of a regular breakfast tray without difficulty  Vital signs in last 24 hours: Temp:  [97.8 F (36.6 C)-98.4 F (36.9 C)] 98.4 F (36.9 C) (08/01 0518) Pulse Rate:  [62-73] 69 (08/01 0518) Resp:  [16-18] 17 (08/01 0518) BP: (148-166)/(87-95) 148/92 (08/01 1029) SpO2:  [97 %-100 %] 98 % (08/01 0932) Last BM Date: 12/10/18 General:   Alert,  pleasant and cooperative in NAD Abdomen: Nondistended.  Positive bowel sounds.  Soft and nontender Extremities:  Without clubbing or edema.    Intake/Output from previous day: 07/31 0701 - 08/01 0700 In: -  Out: 900 [Urine:900] Intake/Output this shift: Total I/O In: 240 [P.O.:240] Out: 800 [Urine:800]  Lab Results: Recent Labs    12/09/18 0923 12/10/18 0502 12/11/18 0608  WBC 5.1 2.0* 2.0*  HGB 11.8* 10.4* 10.4*  HCT 37.0 31.5* 32.3*  PLT 285 250 275   BMET Recent Labs    12/09/18 0923 12/10/18 0502 12/11/18 0608  NA 137 136 135  K 3.8 3.8 3.6  CL 103 105 101  CO2 24 24 25   GLUCOSE 89 93 89  BUN 12 11 8   CREATININE 0.68 0.76 0.69  CALCIUM 9.0 8.9 8.9   LFT Recent Labs    12/11/18 0608  PROT 7.8  ALBUMIN 3.3*  AST 94*  ALT 152*  ALKPHOS 273*  BILITOT 0.7   PT/INR No results for input(s): LABPROT, INR in the last 72 hours. Hepatitis Panel No results for input(s): HEPBSAG, HCVAB, HEPAIGM, HEPBIGM in the last 72 hours. C-Diff No results for input(s): CDIFFTOX in the last 72 hours.  Studies/Results: Nm Hepato W/eject Fract  Result Date: 12/10/2018 CLINICAL DATA:  Abdominal pain, nausea and vomiting for 3 years. EXAM: NUCLEAR MEDICINE HEPATOBILIARY IMAGING WITH GALLBLADDER EF TECHNIQUE: Sequential images of the abdomen were obtained out to 60 minutes following intravenous administration of radiopharmaceutical. After slow intravenous infusion of 1.32 micrograms Cholecystokinin, gallbladder ejection fraction was determined. RADIOPHARMACEUTICALS:   5.4 mCi Tc-92m Choletec IV COMPARISON:  None. FINDINGS: Prompt uptake and biliary excretion of activity by the liver is seen. Gallbladder activity is visualized, consistent with patency of cystic duct. Biliary activity passes into small bowel, consistent with patent common bile duct. CCK was administered but there was no response to CCK and radiotracer continued to accumulate in the gallbladder after CCK. (At 60 min, normal ejection fraction is greater than 40%.) IMPRESSION: Continued accumulation of radiotracer after CCK administration could be due to biliary dyskinesia, opioid use or chronic cholecystitis. Electronically Signed   By: Inge Rise M.D.   On: 12/10/2018 09:43   US Abdomen Limited Ruq  Result Date: 12/09/2018 CLINICAL DATA:  Right upper quadrant pain with nausea and vomiting EXAM: ULTRASOUND ABDOMEN LIMITED RIGHT UPPER QUADRANT COMPARISON:  None. FINDINGS: Gallbladder: Within the gallbladder, there is an echogenic focus which moves in shadows consistent with cholelithiasis. The gallbladder wall is mildly thickened and mildly edematous. There is a slight degree of pericholecystic fluid. The patient is focally tender over the gallbladder. No sonographic Murphy sign noted by sonographer. Common bile duct: Diameter: 5 mm. No intrahepatic or extrahepatic biliary duct dilatation. Liver: No focal lesion identified. Liver echogenicity is increased. Liver contour is mildly lobular. Portal vein is patent on color Doppler imaging with normal direction of blood flow towards the liver. Other: None. IMPRESSION: 1. Cholelithiasis. Gallbladder wall is mildly thickened and edematous with subtle pericholecystic fluid. Patient is tender over the gallbladder. These are findings concerning  for acute cholecystitis. 2. Increase in liver echogenicity, a finding felt to represent a degree of hepatic steatosis. No focal liver lesions evident. Liver contour mildly lobular. There may be a degree of underlying hepatic  cirrhosis. Appropriate laboratory assessment in this regard advised. Electronically Signed   By: Lowella Grip III M.D.   On: 12/09/2018 13:12     Impression: Acute calculus cholecystitis.  Pattern of elevation in LFTs most consistent with transient obstruction likely due to choledocholithiasis.  Clinically much improved.  Echogenic slightly nodular liver on imaging- separate issue.   Recommendations:   Elective cholecystectomy.  Follow LFTs down to complete normalcy.  If liver appears abnormal laparoscopically, would consider biopsy at that time. Continue twice daily PPI empirically until seen in follow-up by [facility].

## 2018-12-11 NOTE — Progress Notes (Signed)
[facility] Progress Note     Subjective: Doing fair and eating diet. Says she is feeling much better. Pain much improved.   HIDA yesterday with filling of the gallbladder- so the patient does not have acute cholecystitis. HIDA did have some findings more consistent with chronic chole versus biliary dyskinesia.   Objective: Vital signs in last 24 hours: Temp:  [97.8 F (36.6 C)-98.4 F (36.9 C)] 98.3 F (36.8 C) (08/01 1256) Pulse Rate:  [62-75] 75 (08/01 1256) Resp:  [16-19] 19 (08/01 1256) BP: (145-166)/(87-95) 145/92 (08/01 1256) SpO2:  [97 %-100 %] 99 % (08/01 1256) Last BM Date: 12/10/18  Intake/Output from previous day: 07/31 0701 - 08/01 0700 In: -  Out: 900 [Urine:900] Intake/Output this shift: Total I/O In: 240 [P.O.:240] Out: 800 [Urine:800]  General appearance: alert, cooperative and no distress Resp: normal work of breathing GI: soft, nondistended, nontender, loose skin on abdomen from weight loss  Lab Results:  Recent Labs    12/10/18 0502 12/11/18 0608  WBC 2.0* 2.0*  HGB 10.4* 10.4*  HCT 31.5* 32.3*  PLT 250 275   BMET Recent Labs    12/10/18 0502 12/11/18 0608  NA 136 135  K 3.8 3.6  CL 105 101  CO2 24 25  GLUCOSE 93 89  BUN 11 8  CREATININE 0.76 0.69  CALCIUM 8.9 8.9   Gallbladder fills with contrast- HIDA NEGATIVE FOR ACUTE CHOLECYSTITIS Studies/Results: Nm Hepato W/eject Fract  Result Date: 12/10/2018 CLINICAL DATA:  Abdominal pain, nausea and vomiting for 3 years. EXAM: NUCLEAR MEDICINE HEPATOBILIARY IMAGING WITH GALLBLADDER EF TECHNIQUE: Sequential images of the abdomen were obtained out to 60 minutes following intravenous administration of radiopharmaceutical. After slow intravenous infusion of 1.32 micrograms Cholecystokinin, gallbladder ejection fraction was determined. RADIOPHARMACEUTICALS:  5.4 mCi Tc-57m Choletec IV COMPARISON:  None. FINDINGS: Prompt uptake and biliary excretion of activity by the liver is  seen. Gallbladder activity is visualized, consistent with patency of cystic duct. Biliary activity passes into small bowel, consistent with patent common bile duct. CCK was administered but there was no response to CCK and radiotracer continued to accumulate in the gallbladder after CCK. (At 60 min, normal ejection fraction is greater than 40%.) IMPRESSION: Continued accumulation of radiotracer after CCK administration could be due to biliary dyskinesia, opioid use or chronic cholecystitis. Electronically Signed   By: Inge Rise M.D.   On: 12/10/2018 09:43    Assessment/Plan: Ms. Bazzano is a 68 yo with multiple current medical issues going on including pulmonary, cardiac, and esophageal pathology consistent with possible scleroderma. She has had 100 lb weight loss in 1 year, and complains of vomiting up meals and food getting stuck in her esophagus/ sternal area. She says some food is thrown up from her stomach and other is stuck and she vomits it up.  She has had elevation of her LFTs but no T bilirubin elevation at the entire admission, which makes me doubt any choledocholithiasis as this elevated with that entity. HIDA ruled out any acute cholecystitis.   Her symptoms have been going on for year+ and she has symptoms that could be consistent with her gallbladder having chronic cholecystitis but not acute cholecystitis.  Her LFTs are trending down nicely.  Given her prior weight of 300 lbs, concern for cirrhosis on [place] and HIDA, I question some degree of fatty liver/ cirrhosis causing her LFT elevation.    I have discussed with her and talked to her son about her symptoms. We have discussed that she  is higher risk for any surgical intervention, especially if she is diagnosed with scleroderma as she can have issues with anesthesia.  Taking her gallbladder out will need to be thoroughly discussed and contemplated as an outpatient as she has complex symptoms and medical issues, and her symptoms may not  improve at all from removing the gallbladder.    I will plan to see her as an outpatient around 8/13 which is after her rheumatology appointment 8/10 that she reports is in [facility].  I have asked her to bring son, Dannielle Huh to the appointment so we can all discuss further.    LOS: 2 days    Virl Cagey 12/11/2018

## 2018-12-13 ENCOUNTER — Ambulatory Visit (INDEPENDENT_AMBULATORY_CARE_PROVIDER_SITE_OTHER): Payer: Medicare Other | Admitting: Internal Medicine

## 2018-12-13 ENCOUNTER — Other Ambulatory Visit: Payer: Self-pay

## 2018-12-13 ENCOUNTER — Telehealth: Payer: Self-pay | Admitting: *Deleted

## 2018-12-13 DIAGNOSIS — R1010 Upper abdominal pain, unspecified: Secondary | ICD-10-CM | POA: Diagnosis not present

## 2018-12-13 DIAGNOSIS — R7989 Other specified abnormal findings of blood chemistry: Secondary | ICD-10-CM

## 2018-12-13 DIAGNOSIS — R945 Abnormal results of liver function studies: Secondary | ICD-10-CM

## 2018-12-13 DIAGNOSIS — N181 Chronic kidney disease, stage 1: Secondary | ICD-10-CM | POA: Diagnosis not present

## 2018-12-13 DIAGNOSIS — E785 Hyperlipidemia, unspecified: Secondary | ICD-10-CM | POA: Diagnosis not present

## 2018-12-13 DIAGNOSIS — E1122 Type 2 diabetes mellitus with diabetic chronic kidney disease: Secondary | ICD-10-CM

## 2018-12-13 NOTE — Telephone Encounter (Signed)
Transition Care Management Follow-up Telephone Call   Date discharged? 12/11/18   How have you been since you were released from the hospital? "Doing better"   Do you understand why you were in the hospital? yes   Do you understand the discharge instructions? yes   Where were you discharged to? Home. Lives alone. Has to use transportation.    Items Reviewed:  Medications reviewed: "They added zofran and maalox"  Allergies reviewed: yes  Dietary changes reviewed: yes  Referrals reviewed: yes   Functional Questionnaire:   Activities of Daily Living (ADLs):   She states they are independent in the following: ambulation, bathing and hygiene, feeding, continence, grooming, toileting and dressing States they require assistance with the following: n/a  Any transportation issues/concerns?: yes. I have to call for transportation 3 days ahead of time and I have too many appts.  Any patient concerns? no   Confirmed importance and date/time of follow-up visits scheduled yes  Provider Appointment booked with PCP (phone only) 12/13/18  Confirmed with patient if condition begins to worsen call PCP or go to the ER.  Patient was given the office number and encouraged to call back with question or concerns.  : yes

## 2018-12-13 NOTE — Progress Notes (Signed)
Subjective:    Patient ID: [name], female    DOB: [birth year], 68 y.o.   MRN: [id number]  DOS:  12/13/2018 Type of visit - description: Attempted  to make this a video visit, due to technical difficulties from the patient side it was not possible  thus we proceeded with a Virtual Visit via Telephone    I connected with@   by telephone and verified that I am speaking with the correct person using two identifiers.  THIS ENCOUNTER IS A VIRTUAL VISIT DUE TO COVID-19 - PATIENT WAS NOT SEEN IN THE OFFICE. PATIENT HAS CONSENTED TO VIRTUAL VISIT / TELEMEDICINE VISIT   Location of patient: home  Location of provider: office  I discussed the limitations, risks, security and privacy concerns of performing an evaluation and management service by telephone and the availability of in person appointments. I also discussed with the patient that there may be a patient responsible charge related to this service. The patient expressed understanding and agreed to proceed.   History of Present Illness:  TCM 7 Patient was admitted to the hospital with epigastric abdominal pain.  She had nausea/vomiting intermittently for months.  Ultrasound upon admission showed cholelithiasis, GB was mildly thickened and edematous.  Initial impression was acute cholecystitis with associated transaminitis. She was started on IV antibiotics, surgery consulted, they felt like cholecystitis was rather chronic and the the risk of proceeding with surgery was high.  Subsequently a HIDA scan did not demonstrate acute changes LFTs were trending down. GI consulted: Probably had choledocholithiasis which is now resolving. Eventually was discharged home in improved condition, was recommended follow-up by PCP, GI and surgery.  Review of Systems  Since she left the hospital, the pain at the abdomen is essentially gone. Appetite is improving No nausea or vomiting Continue with some dysphagia/odynophagia. No fever chills.  Had diarrhea after the last admission but that has resolved in the last 48 hours. Continue with shortness of breath on and off, at baseline.  Past Medical History:  Diagnosis Date  . Anterolisthesis    Grade 1, L4-5  . Bronchospasm 05/28/2012  . COPD (chronic obstructive pulmonary disease) ([place])   . DEGENERATIVE JOINT DISEASE 10/06/2006  . DEPRESSION 09/26/2008  . DIABETES MELLITUS 10/06/2006  . Diverticulosis    [id number]  . GERD (gastroesophageal reflux disease) 07/25/2013  . HYPERLIPIDEMIA 01/11/2009  . HYPERTENSION 10/06/2006  . Hypokalemia   . Hypomagnesemia   . INSOMNIA 09/26/2008  . Internal hemorrhoids   . OBSTRUCTIVE SLEEP APNEA 06/23/2008   Severe OSA per sleep study 2010, Rx a CPAP  . Pain in joint, multiple sites 11/10/2006  . Pericardial effusion   . Pericarditis   . UNSPECIFIED ANEMIA 12/10/2009  . UTI (urinary tract infection) 11/2017    Past Surgical History:  Procedure Laterality Date  . BIOPSY  12/28/2017   Procedure: BIOPSY;  Surgeon: Irving Copas., MD;  Location: [place];  Service: Gastroenterology;;  . COLONOSCOPY  08/01/2011   Procedure: COLONOSCOPY;  Surgeon: Inda Castle, MD;  Location: [facility];  Service: Endoscopy;  Laterality: N/A;  . ESOPHAGOGASTRODUODENOSCOPY (EGD) WITH PROPOFOL N/A 12/28/2017   Procedure: ESOPHAGOGASTRODUODENOSCOPY (EGD) WITH PROPOFOL;  Surgeon: Rush Landmark Telford Nab., MD;  Location: [place];  Service: Gastroenterology;  Laterality: N/A;  . LEFT HEART CATH AND CORONARY ANGIOGRAPHY N/A 10/22/2017   Procedure: LEFT HEART CATH AND CORONARY ANGIOGRAPHY;  Surgeon: Jettie Booze, MD;  Location: [place] [facility];  Service: Cardiovascular;  Laterality: N/A;  . Left knee  replacement  07/2007  . Right knee replacement  2005    Social History   Socioeconomic History  . Marital status: Widowed    Spouse name: Not on file  . Number of children: 4  . Years of education: Not on file  . Highest education level: Not  on file  Occupational History  . Occupation: disability    Employer: UNEMPLOYED  Social Needs  . Financial resource strain: Not on file  . Food insecurity    Worry: Not on file    Inability: Not on file  . Transportation needs    Medical: Not on file    Non-medical: Not on file  Tobacco Use  . Smoking status: Former Smoker    Packs/day: 0.20    Years: 4.00    Pack years: 0.80    Types: Cigarettes    Quit date: 05/13/1995    Years since quitting: 23.6  . Smokeless tobacco: Never Used  Substance and Sexual Activity  . Alcohol use: Not Currently  . Drug use: No  . Sexual activity: Not Currently  Lifestyle  . Physical activity    Days per week: Not on file    Minutes per session: Not on file  . Stress: Not on file  Relationships  . Social Herbalist on phone: Not on file    Gets together: Not on file    Attends religious service: Not on file    Active member of club or organization: Not on file    Attends meetings of clubs or organizations: Not on file    Relationship status: Not on file  . Intimate partner violence    Fear of current or ex partner: Not on file    Emotionally abused: Not on file    Physically abused: Not on file    Forced sexual activity: Not on file  Other Topics Concern  . Not on file  Social History Narrative   Widow , lives by herself   Lost a son, 3 living    No children in [place], [place] lives in [place], he visits and helps w/ shopping    Lost husband      Allergies as of 12/13/2018   No Known Allergies     Medication List       Accurate as of December 13, 2018  2:36 PM. If you have any questions, ask your nurse or doctor.        Accu-Chek Aviva device Use as instructed to check blood sugar twice daily dx E11.9   Accu-Chek Aviva Plus test strip Generic drug: glucose blood TEST BLOOD SUGAR TWICE DAILY AND LANCETS TWICE DAILY E11.9   Accu-Chek Softclix Lancets lancets USE TO CHECK BLOOD SUGAR TWICE A DAY   albuterol 108 (90  Base) MCG/ACT inhaler Commonly known as: VENTOLIN HFA Inhale 2 puffs into the lungs every 4 (four) hours as needed for wheezing or shortness of breath.   alum & mag hydroxide-simeth 200-200-20 MG/5ML suspension Commonly known as: MAALOX/MYLANTA Take 30 mLs by mouth every 4 (four) hours as needed for indigestion.   amLODipine 10 MG tablet Commonly known as: NORVASC Take 1 tablet (10 mg total) by mouth daily.   aspirin EC 81 MG tablet Take 1 tablet (81 mg total) by mouth daily.   colchicine 0.6 MG tablet Take 1 tablet (0.6 mg total) by mouth daily.   escitalopram 20 MG tablet Commonly known as: Lexapro Take 1 tablet (20 mg total) by mouth daily.  Fluticasone-Salmeterol 250-50 MCG/DOSE Aepb Commonly known as: ADVAIR Inhale 2 puffs into the lungs daily.   latanoprost 0.005 % ophthalmic solution Commonly known as: XALATAN Place 1 drop into both eyes at bedtime.   levothyroxine 75 MCG tablet Commonly known as: SYNTHROID Take 1 tablet (75 mcg total) by mouth daily before breakfast.   metoprolol tartrate 25 MG tablet Commonly known as: LOPRESSOR TAKE 2 TABLETS BY MOUTH TWICE DAILY What changed:   how much to take  how to take this  when to take this   ondansetron 4 MG tablet Commonly known as: ZOFRAN Take 1 tablet (4 mg total) by mouth every 6 (six) hours as needed for nausea.   OVER THE COUNTER MEDICATION CPAP: At bedtime   OXYGEN Inhale 2 L into the lungs continuous.   pantoprazole 40 MG tablet Commonly known as: PROTONIX Take 1 tablet (40 mg total) by mouth 2 (two) times daily before a meal.   potassium chloride SA 20 MEQ tablet Commonly known as: K-DUR Take 1 tablet (20 mEq total) by mouth 2 (two) times daily.           Objective:   Physical Exam There were no vitals taken for this visit. This is virtual phone interview, she is alert oriented x3.     Assessment     Assessment DM per endocrinology HTN Hyperlipidemia Depression, insomnia  (citalopram caused headache ? 05-2014) MSK: --DJD, saw ortho ~06-2014 --Multiple joint pains, 2008, sedimentation rate, RA >> negative --Back pain>> -- MRI 2007: Epidural lipomatosis, congenitally short pedicles, spondylosis, and disc bulges and protrusions contribute to central, foraminal, and subarticular lateral recess stenoses as detailed above.  --2008, saw Ortho, Rx local injections  --local injections back Dr Mina Marble  ~ 12-2015 GI --GERD --Persisting nausea, vomiting, weight loss: Extensive work-up ~ 12/2017. --Colonoscopy due 2023 Pulmonary -Chronic respiratory failure  -asthma -OSA dx 2010, severe, nocturnal desaturation, intolerant to CPAP, on nocturnal O2 -ONO 01/2017:  + Hypoxia at night, rec  to continue oxygen at night unless able  to tolerate a CPAP   DOE, chronic CP, chronic RUQ pain:: 2004--Cath neg, areterial LE dopplers (-) 06-2003-- abd. u/s fatty liver 01-2005-- CP...neg u/s GB, neg HIDA 10-06--saw cards--cardiolite neg 12-2007 abnormal stress test Cath 02-01-08: normal coronaries  chest pain, stress test echo okay 09/2016 SOB, admitted , cath 10/2017: no CAD H/o Morbid obesity   PLAN:  Abdominal pain, increased LFTs: S/p admission, initially ultrasound showed cholelithiasis, surgery consulted, subsequently a HIDA scan did not demonstrate acute changes. Eventually surgery decided no operation was indicated at that time. Saw GI, they felt she probably had choledocholithiasis which is now resolving. She was discharged home,  she already has a follow-up scheduled with GI.  Clinically improved. Unable to bring her to the office for blood work due to transportation. DM: Currently on no meds High cholesterol: Off Crestor due to recent increased LFTs. Hypothyroidism: Reports good compliance, will check a TSH at the next opportunity Rheumatology referral: Has an appointment pending for 12/20/2018. RTC November 2020 at the same time she has Medicare wellness exam   I discussed  the assessment and treatment plan with the patient. The patient was provided an opportunity to ask questions and all were answered. The patient agreed with the plan and demonstrated an understanding of the instructions.   The patient was advised to call back or seek an in-person evaluation if the symptoms worsen or if the condition fails to improve as anticipated.  I provided 25  minutes  of non-face-to-face time during this encounter.  Kathlene November, MD

## 2018-12-14 ENCOUNTER — Other Ambulatory Visit: Payer: Self-pay

## 2018-12-14 NOTE — Assessment & Plan Note (Signed)
Abdominal pain, increased LFTs: S/p admission, initially ultrasound showed cholelithiasis, surgery consulted, subsequently a HIDA scan did not demonstrate acute changes. Eventually surgery decided no operation was indicated at that time. Saw GI, they felt she probably had choledocholithiasis which is now resolving. She was discharged home,  she already has a follow-up scheduled with GI.  Clinically improved. Unable to bring her to the office for blood work due to transportation. DM: Currently on no meds High cholesterol: Off Crestor due to recent increased LFTs. Hypothyroidism: Reports good compliance, will check a TSH at the next opportunity Rheumatology referral: Has an appointment pending for 12/20/2018. RTC November 2020 at the same time she has Medicare wellness exam

## 2018-12-14 NOTE — Patient Outreach (Addendum)
[facility]) Care Management  12/14/2018  [name] Jan 15, 1951 [id number]   EMMI- General Discharge RED ON EMMI ALERT Day # 1 Date: 12/13/2018 Red Alert Reason:  Know who to call about changes in condition? No  Other questions/problems? Yes    Outreach attempt: no answer.  HIPAA compliant voice message left.     Plan: RN CM will attempt patient again within 4 business days and send letter.  Update: Incoming call from patient. She is able to verify HIPAA. She states that she is still having some nausea and vomiting but has medication that she takes and has an appointment with the GI doctor on Thursday.  Patient reports she went to pick up her medications this morning and wondered if she should take her medication for her stomach being that she had not taken it early.  Advised patient it was ok to take medication right now.  She verbalized understanding.  Addressed red alert.  Patient reports she has lots of doctors and sometimes gets confused. Advised patient if she does have problems contact her PCP.  She verbalized understanding.  She states she has an aide that comes daily for 2.5 hours and helps her with her bathing, around the house, and other things.  Patient denies any other questions, needs, or concerns.  Patient also declines further support at this time.    Plan: RN CM will close case.     Jone Baseman, RN, MSN [facility] Care Management Care Management Coordinator Direct Line [phone] Toll Free: [phone]  Fax: [phone]

## 2018-12-15 ENCOUNTER — Ambulatory Visit: Payer: Medicare Other | Admitting: Nurse Practitioner

## 2018-12-15 ENCOUNTER — Ambulatory Visit: Payer: Medicare Other

## 2018-12-16 ENCOUNTER — Other Ambulatory Visit: Payer: Self-pay

## 2018-12-16 ENCOUNTER — Ambulatory Visit (INDEPENDENT_AMBULATORY_CARE_PROVIDER_SITE_OTHER): Payer: Medicare Other | Admitting: Gastroenterology

## 2018-12-16 ENCOUNTER — Encounter: Payer: Self-pay | Admitting: Gastroenterology

## 2018-12-16 VITALS — BP 151/96 | HR 81 | Temp 97.1°F | Ht 69.0 in | Wt 137.2 lb

## 2018-12-16 DIAGNOSIS — K8010 Calculus of gallbladder with chronic cholecystitis without obstruction: Secondary | ICD-10-CM

## 2018-12-16 DIAGNOSIS — D649 Anemia, unspecified: Secondary | ICD-10-CM

## 2018-12-16 DIAGNOSIS — R1010 Upper abdominal pain, unspecified: Secondary | ICD-10-CM | POA: Diagnosis not present

## 2018-12-16 DIAGNOSIS — R7989 Other specified abnormal findings of blood chemistry: Secondary | ICD-10-CM

## 2018-12-16 DIAGNOSIS — R112 Nausea with vomiting, unspecified: Secondary | ICD-10-CM | POA: Diagnosis not present

## 2018-12-16 DIAGNOSIS — R945 Abnormal results of liver function studies: Secondary | ICD-10-CM | POA: Diagnosis not present

## 2018-12-16 DIAGNOSIS — R634 Abnormal weight loss: Secondary | ICD-10-CM

## 2018-12-16 NOTE — Assessment & Plan Note (Signed)
Continues to have postprandial N/V. Pain in both upper and mid-abdomen. Follow up with general surgery next week.   If she has her gallbladder removed, and if her liver looks abnormal, would recommend liver biopsy at that time.

## 2018-12-16 NOTE — Assessment & Plan Note (Signed)
Possible recent abnormal LFTs due to passage of CBD stone. May have underlying cirrhosis with ?lobular contour of liver on u/s. Update labs at this time. If liver is grossly abnormal at time of gallbladder surgery, obtain liver biopsy.

## 2018-12-16 NOTE — Patient Instructions (Signed)
1. Please have your labs done today. We will contact you with results and discuss what else we need to do. 2. Keep appointments with Dr. Constance Haw and your new PCP [facility]).  3. Try to eat bland, low fat foods for now.

## 2018-12-16 NOTE — Progress Notes (Signed)
Primary Care Physician: Colon Branch, MD  Primary Gastroenterologist:  [name] Cornea, MD   Chief Complaint  Patient presents with  . Follow-up    CMP    HPI: [name] is a 68 y.o. female here for hospital f/u. Admitted last week with abd pain, vomiting, abnormal LFTs. RUQ U/S with findings concerning for acute cholecystitis. Liver with increased echogenicity and contour mildly lobular. HIDA with continued accumulation of radiotracer after CCK administration could be due to biliary dyskinesia, opioid use or chronic cholecystitis.   Labs showed evidence of chronic leukopenia, mild anemia with Hgb 10.4. from admission to discharge two days later, her AST went from 846 to 94, ALT 194 to 260 to 152, AP 362 to 273. Lipase normal.   In 05/2018 her iron was low at 32, ferritin normal at 256. Hep B surface ag negative 2019. HCV Ab negative 2016. HIV NR 11/2018.    She has lost 100 pounds in one year, unintentional. Lost total of 166 pounds since 07/2017. States weight started coming off once insulin stopped. No longer on DM meds since weight loss, recent normal A1C.   For past two days she has had recurrent postprandial vomiting. Recurrent abdominal pain, mid abdomen. Some lower abd pain. Vomiting last night. Sometimes food stay down and hour or two and then come back up. Breakfast this morning has stayed down - saugsage biscuit and hashbrown, coffee. Feeling ok. BM good. No melena, brbpr.     Current Outpatient Medications  Medication Sig Dispense Refill  . Accu-Chek Softclix Lancets lancets USE TO CHECK BLOOD SUGAR TWICE A DAY 200 each 12  . albuterol (VENTOLIN HFA) 108 (90 Base) MCG/ACT inhaler Inhale 2 puffs into the lungs every 4 (four) hours as needed for wheezing or shortness of breath. 18 g 5  . alum & mag hydroxide-simeth (MAALOX/MYLANTA) 200-200-20 MG/5ML suspension Take 30 mLs by mouth every 4 (four) hours as needed for indigestion. 355 mL 0  . amLODipine (NORVASC) 10  MG tablet Take 1 tablet (10 mg total) by mouth daily. 90 tablet 3  . aspirin EC 81 MG tablet Take 1 tablet (81 mg total) by mouth daily. 90 tablet 3  . Blood Glucose Monitoring Suppl (ACCU-CHEK AVIVA) device Use as instructed to check blood sugar twice daily dx E11.9 1 each 0  . colchicine 0.6 MG tablet Take 1 tablet (0.6 mg total) by mouth daily. 30 tablet 1  . escitalopram (LEXAPRO) 20 MG tablet Take 1 tablet (20 mg total) by mouth daily. 90 tablet 1  . Fluticasone-Salmeterol (ADVAIR) 250-50 MCG/DOSE AEPB Inhale 2 puffs into the lungs daily. 60 each 5  . glucose blood (ACCU-CHEK AVIVA PLUS) test strip TEST BLOOD SUGAR TWICE DAILY AND LANCETS TWICE DAILY E11.9 200 each 11  . latanoprost (XALATAN) 0.005 % ophthalmic solution Place 1 drop into both eyes at bedtime.    Marland Kitchen levothyroxine (SYNTHROID) 75 MCG tablet Take 1 tablet (75 mcg total) by mouth daily before breakfast. 90 tablet 1  . metoprolol tartrate (LOPRESSOR) 25 MG tablet TAKE 2 TABLETS BY MOUTH TWICE DAILY (Patient taking differently: Take 50 mg by mouth 2 (two) times daily. TAKE 2 TABLETS BY MOUTH TWICE DAILY) 360 tablet 1  . ondansetron (ZOFRAN) 4 MG tablet Take 1 tablet (4 mg total) by mouth every 6 (six) hours as needed for nausea. 20 tablet 0  . OVER THE COUNTER MEDICATION CPAP: At bedtime    . OXYGEN Inhale 2 L into the lungs  continuous.     . pantoprazole (PROTONIX) 40 MG tablet Take 1 tablet (40 mg total) by mouth 2 (two) times daily before a meal. 60 tablet 3  . potassium chloride SA (K-DUR) 20 MEQ tablet Take 1 tablet (20 mEq total) by mouth 2 (two) times daily. 180 tablet 1   No current facility-administered medications for this visit.     Allergies as of 12/16/2018  . (No Known Allergies)    ROS:  General: Negative for anorexia, weight loss, fever, chills, fatigue, weakness. ENT: Negative for hoarseness, difficulty swallowing , nasal congestion. CV: Negative for chest pain, angina, palpitations, dyspnea on exertion,  peripheral edema.  Respiratory: Negative for dyspnea at rest, dyspnea on exertion, cough, sputum, wheezing.  GI: See history of present illness. GU:  Negative for dysuria, hematuria, urinary incontinence, urinary frequency, nocturnal urination.  Endo: Negative for unusual weight change.    Physical Examination:   BP (!) 151/96   Pulse 81   Temp (!) 97.1 F (36.2 C) (Oral)   Ht 5\' 9"  (1.753 m)   Wt 137 lb 3.2 oz (62.2 kg)   BMI 20.26 kg/m   General: Well-nourished, well-developed in no acute distress.  Eyes: No icterus. Mouth: Oropharyngeal mucosa moist and pink , no lesions erythema or exudate. Lungs: Clear to auscultation bilaterally.  Heart: Regular rate and rhythm, no murmurs rubs or gallops.  Abdomen: Bowel sounds are normal, nontender, nondistended, no hepatosplenomegaly or masses, no abdominal bruits or hernia , no rebound or guarding.   Extremities: No lower extremity edema. No clubbing or deformities. Neuro: Alert and oriented x 4   Skin: Warm and dry, no jaundice.   Psych: Alert and cooperative, normal mood and affect.  Labs:  Lab Results  Component Value Date   CREATININE 0.69 12/11/2018   BUN 8 12/11/2018   NA 135 12/11/2018   K 3.6 12/11/2018   CL 101 12/11/2018   CO2 25 12/11/2018   Lab Results  Component Value Date   ALT 152 (H) 12/11/2018   AST 94 (H) 12/11/2018   ALKPHOS 273 (H) 12/11/2018   BILITOT 0.7 12/11/2018   Lab Results  Component Value Date   LIPASE 45 12/09/2018   Lab Results  Component Value Date   WBC 2.0 (L) 12/11/2018   HGB 10.4 (L) 12/11/2018   HCT 32.3 (L) 12/11/2018   MCV 82.0 12/11/2018   PLT 275 12/11/2018     Imaging Studies: Nm Hepato W/eject Fract  Result Date: 12/10/2018 CLINICAL DATA:  Abdominal pain, nausea and vomiting for 3 years. EXAM: NUCLEAR MEDICINE HEPATOBILIARY IMAGING WITH GALLBLADDER EF TECHNIQUE: Sequential images of the abdomen were obtained out to 60 minutes following intravenous administration of  radiopharmaceutical. After slow intravenous infusion of 1.32 micrograms Cholecystokinin, gallbladder ejection fraction was determined. RADIOPHARMACEUTICALS:  5.4 mCi Tc-20m Choletec IV COMPARISON:  None. FINDINGS: Prompt uptake and biliary excretion of activity by the liver is seen. Gallbladder activity is visualized, consistent with patency of cystic duct. Biliary activity passes into small bowel, consistent with patent common bile duct. CCK was administered but there was no response to CCK and radiotracer continued to accumulate in the gallbladder after CCK. (At 60 min, normal ejection fraction is greater than 40%.) IMPRESSION: Continued accumulation of radiotracer after CCK administration could be due to biliary dyskinesia, opioid use or chronic cholecystitis. Electronically Signed   By: Inge Rise M.D.   On: 12/10/2018 09:43   US Abdomen Limited Ruq  Result Date: 12/09/2018 CLINICAL DATA:  Right upper quadrant  pain with nausea and vomiting EXAM: ULTRASOUND ABDOMEN LIMITED RIGHT UPPER QUADRANT COMPARISON:  None. FINDINGS: Gallbladder: Within the gallbladder, there is an echogenic focus which moves in shadows consistent with cholelithiasis. The gallbladder wall is mildly thickened and mildly edematous. There is a slight degree of pericholecystic fluid. The patient is focally tender over the gallbladder. No sonographic Murphy sign noted by sonographer. Common bile duct: Diameter: 5 mm. No intrahepatic or extrahepatic biliary duct dilatation. Liver: No focal lesion identified. Liver echogenicity is increased. Liver contour is mildly lobular. Portal vein is patent on color Doppler imaging with normal direction of blood flow towards the liver. Other: None. IMPRESSION: 1. Cholelithiasis. Gallbladder wall is mildly thickened and edematous with subtle pericholecystic fluid. Patient is tender over the gallbladder. These are findings concerning for acute cholecystitis. 2. Increase in liver echogenicity, a finding  felt to represent a degree of hepatic steatosis. No focal liver lesions evident. Liver contour mildly lobular. There may be a degree of underlying hepatic cirrhosis. Appropriate laboratory assessment in this regard advised. Electronically Signed   By: Lowella Grip III M.D.   On: 12/09/2018 13:12

## 2018-12-16 NOTE — Assessment & Plan Note (Signed)
Chronic anemia and leukopenia. Recent ferritin normal. Suspect multifactorial. EGD 2019. TCS 2013. Repeat labs.

## 2018-12-16 NOTE — Assessment & Plan Note (Signed)
Massive weight loss, greater than 150 pounds in 18 months. Chronic GI issues. ?weight down due to diminished oral intake or underlying issues. Obtain labs. F/u with general surgery. Encouraged adequate oral intake of bland, low-fat foods.

## 2018-12-17 ENCOUNTER — Other Ambulatory Visit: Payer: Self-pay

## 2018-12-17 NOTE — Patient Outreach (Signed)
[facility]) Care Management  12/17/2018  [name] 1950-08-06 [id number]   EMMI- General Discharge RED ON EMMI ALERT Day # 4 Date: 12/16/2018 Red Alert Reason: Sad, hopeless, anxious, empty? Yes  Outreach attempt: spoke with patient. She states that she is doing ok. Addressed red alert.  She states that was a mistake.  She states she had a follow up appointment on yesterday and that she has 2 other appointments coming up.  She denies any needs with transportation. Patient taking her medications as prescribed.  Patient declines any further support needs at this time.    Plan: RN CM will close case.    Jone Baseman, RN, MSN [facility] Care Management Care Management Coordinator Direct Line [phone] Toll Free: [phone]  Fax: [phone]

## 2018-12-19 DIAGNOSIS — I509 Heart failure, unspecified: Secondary | ICD-10-CM | POA: Diagnosis not present

## 2018-12-19 DIAGNOSIS — I2510 Atherosclerotic heart disease of native coronary artery without angina pectoris: Secondary | ICD-10-CM | POA: Diagnosis not present

## 2018-12-20 DIAGNOSIS — M79641 Pain in right hand: Secondary | ICD-10-CM | POA: Diagnosis not present

## 2018-12-20 DIAGNOSIS — M1811 Unilateral primary osteoarthritis of first carpometacarpal joint, right hand: Secondary | ICD-10-CM | POA: Diagnosis not present

## 2018-12-20 DIAGNOSIS — I319 Disease of pericardium, unspecified: Secondary | ICD-10-CM | POA: Diagnosis not present

## 2018-12-20 DIAGNOSIS — M1812 Unilateral primary osteoarthritis of first carpometacarpal joint, left hand: Secondary | ICD-10-CM | POA: Diagnosis not present

## 2018-12-20 DIAGNOSIS — M19071 Primary osteoarthritis, right ankle and foot: Secondary | ICD-10-CM | POA: Diagnosis not present

## 2018-12-20 DIAGNOSIS — M2550 Pain in unspecified joint: Secondary | ICD-10-CM | POA: Diagnosis not present

## 2018-12-20 DIAGNOSIS — M19072 Primary osteoarthritis, left ankle and foot: Secondary | ICD-10-CM | POA: Diagnosis not present

## 2018-12-20 DIAGNOSIS — Z79899 Other long term (current) drug therapy: Secondary | ICD-10-CM | POA: Diagnosis not present

## 2018-12-20 DIAGNOSIS — M19042 Primary osteoarthritis, left hand: Secondary | ICD-10-CM | POA: Diagnosis not present

## 2018-12-20 DIAGNOSIS — M79642 Pain in left hand: Secondary | ICD-10-CM | POA: Diagnosis not present

## 2018-12-20 DIAGNOSIS — M79671 Pain in right foot: Secondary | ICD-10-CM | POA: Diagnosis not present

## 2018-12-20 DIAGNOSIS — M79672 Pain in left foot: Secondary | ICD-10-CM | POA: Diagnosis not present

## 2018-12-20 DIAGNOSIS — M19032 Primary osteoarthritis, left wrist: Secondary | ICD-10-CM | POA: Diagnosis not present

## 2018-12-20 NOTE — Progress Notes (Signed)
CC'D TO PCP °

## 2018-12-21 ENCOUNTER — Encounter: Payer: Self-pay | Admitting: Cardiology

## 2018-12-21 ENCOUNTER — Other Ambulatory Visit: Payer: Self-pay

## 2018-12-21 ENCOUNTER — Ambulatory Visit (INDEPENDENT_AMBULATORY_CARE_PROVIDER_SITE_OTHER): Payer: Medicare Other | Admitting: Family Medicine

## 2018-12-21 ENCOUNTER — Encounter: Payer: Self-pay | Admitting: Family Medicine

## 2018-12-21 VITALS — BP 180/101 | HR 81 | Temp 97.6°F | Ht 69.0 in | Wt 143.0 lb

## 2018-12-21 DIAGNOSIS — D649 Anemia, unspecified: Secondary | ICD-10-CM | POA: Diagnosis not present

## 2018-12-21 DIAGNOSIS — K219 Gastro-esophageal reflux disease without esophagitis: Secondary | ICD-10-CM

## 2018-12-21 DIAGNOSIS — N181 Chronic kidney disease, stage 1: Secondary | ICD-10-CM

## 2018-12-21 DIAGNOSIS — E1122 Type 2 diabetes mellitus with diabetic chronic kidney disease: Secondary | ICD-10-CM

## 2018-12-21 DIAGNOSIS — E039 Hypothyroidism, unspecified: Secondary | ICD-10-CM

## 2018-12-21 DIAGNOSIS — R202 Paresthesia of skin: Secondary | ICD-10-CM

## 2018-12-21 DIAGNOSIS — R200 Anesthesia of skin: Secondary | ICD-10-CM

## 2018-12-21 DIAGNOSIS — F32A Depression, unspecified: Secondary | ICD-10-CM

## 2018-12-21 DIAGNOSIS — F329 Major depressive disorder, single episode, unspecified: Secondary | ICD-10-CM

## 2018-12-21 DIAGNOSIS — I10 Essential (primary) hypertension: Secondary | ICD-10-CM

## 2018-12-21 NOTE — Patient Instructions (Signed)
Keep appt for surgery -Dr. Constance Haw Keep appt for cardiology to give approval for surgery Keep follow up appointment with gastroenterology after labwork completed Keep follow up appointment with rheumatology to review symptoms after taking prednisone Check blood pressure twice a day while on prednisone Eat a snack prior to bedtime to avoid low glucose readings in the morning-Ensure to improve nutrition Have blood work drawn as recommended by gastroenterology, add B12 level

## 2018-12-21 NOTE — Progress Notes (Signed)
New Patient Office Visit  Subjective:  Patient ID: [name], female    DOB: [birth year]  Age: 68 y.o. MRN: [id number]  CC:  Chief Complaint  Patient presents with  . New Patient (Initial Visit)  . Arm Pain    bilateral pain and numbness  . Foot Pain    bilateral numbness  . Gastroesophageal Reflux    HPI [name] presents for : DM per endocrinology-would like to see Dr. Dorris Fetch HTN-pt seen by cardiology in the past-amlodipine, lopressor-taken this morning-bp cuff at home-blood pressure normal at rheumatology yesterday pericarditiis-cardiology following Hyperlipidemia-no longer taking medication due to elevated LFT Depression, insomnia (citalopram caused headache ? 05-2014) -lexapro-pt states "I don't need nothing else", no counseling requested --DJD, saw ortho ~06-2014 --Multiple joint pains, 2008, sedimentation rate, RA - negative-Saw rheumatology -pt started on prednisone-pt has not taken yet --Back pain>> -- MRI 2007: Epidural lipomatosis, congenitally short pedicles, spondylosis, and disc bulges and protrusions contribute to central, foraminal, and subarticular lateral recess stenoses as detailed above.  --2008, saw Ortho, Rx local injections  --local injections back Dr Mina Marble  ~ 12-2015 GI --GERD-protonix --Persisting nausea, vomiting, weight loss: Extensive work-up ~ 12/2017. --Colonoscopy due 2023 Pulmonary -Chronic respiratory failure-albuterol daily at least 2-3 times a day  -asthma-advair twice a day-DOE-not followed by pulmonary-Dr. Halford Chessman -OSA dx 2010, severe, nocturnal desaturation, intolerant to CPAP, on nocturnal O2-pt uses prn DOE, chronic CP, chronic RUQ pain:: 2004--Cath neg, areterial LE dopplers (-) 06-2003-- abd. u/s fatty liver 01-2005-- CP...neg u/s GB, neg HIDA 10-06--saw cards--cardiolite neg 12-2007 abnormal stress test Cath 02-01-08: normal coronaries  chest pain, stress test echo okay 09/2016 SOB, admitted , cath 10/2017: no CAD H/o  Morbid obesity  Past Medical History:  Diagnosis Date  . Anterolisthesis    Grade 1, L4-5  . Bronchospasm 05/28/2012  . COPD (chronic obstructive pulmonary disease) ([place])   . DEGENERATIVE JOINT DISEASE 10/06/2006  . DEPRESSION 09/26/2008  . DIABETES MELLITUS 10/06/2006  . Diverticulosis    [id number]  . GERD (gastroesophageal reflux disease) 07/25/2013  . HYPERLIPIDEMIA 01/11/2009  . HYPERTENSION 10/06/2006  . Hypokalemia   . Hypomagnesemia   . INSOMNIA 09/26/2008  . Internal hemorrhoids   . OBSTRUCTIVE SLEEP APNEA 06/23/2008   Severe OSA per sleep study 2010, Rx a CPAP  . Pain in joint, multiple sites 11/10/2006  . Pericardial effusion   . Pericarditis   . UNSPECIFIED ANEMIA 12/10/2009  . UTI (urinary tract infection) 11/2017    Past Surgical History:  Procedure Laterality Date  . BIOPSY  12/28/2017   Procedure: BIOPSY;  Surgeon: Irving Copas., MD;  Location: [facility];  Service: Gastroenterology;;  . COLONOSCOPY  08/01/2011   Procedure: COLONOSCOPY;  Surgeon: Inda Castle, MD;  Location: [facility];  Service: Endoscopy;  Laterality: N/A;. hyperplastic polyp removed, hemorrhoids s/p banding, diverticulosis, next tcs 10 years.   . ESOPHAGOGASTRODUODENOSCOPY (EGD) WITH PROPOFOL N/A 12/28/2017   Procedure: ESOPHAGOGASTRODUODENOSCOPY (EGD) WITH PROPOFOL;  Surgeon: Rush Landmark Telford Nab., MD;  Location: [facility];  Service: Gastroenterology;  Laterality: N/A; h.pylori gastiris, tortuous esophagus with esophageal bx (no EOE), medium sized hiatal hernia  . LEFT HEART CATH AND CORONARY ANGIOGRAPHY N/A 10/22/2017   Procedure: LEFT HEART CATH AND CORONARY ANGIOGRAPHY;  Surgeon: Jettie Booze, MD;  Location: [facility];  Service: Cardiovascular;  Laterality: N/A;  . 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 Right knee replacement  2005    Family History  Problem Relation Age of Onset  .  Asthma Mother   . Stroke Mother   . Diabetes Mother   . Diabetes Other        M,  B, S  . Hypertension Sister        M, S,B  . Pancreatic cancer Brother   . Colon cancer Neg Hx   . Prostate cancer Neg Hx   . Breast cancer Neg Hx   . Esophageal cancer Neg Hx   . Liver disease Neg Hx   . Rectal cancer Neg Hx   . Stomach cancer Neg Hx   . Inflammatory bowel disease Neg Hx     Social History   Socioeconomic History  . Marital status: Widowed    Spouse name: Not on file  . Number of children: 4  . Years of education: Not on file  . Highest education level: Not on file  Occupational History  . Occupation: disability    Employer: UNEMPLOYED  Social Needs  . Financial resource strain: Not on file  . Food insecurity    Worry: Not on file    Inability: Not on file  . Transportation needs    Medical: Not on file    Non-medical: Not on file  Tobacco Use  . Smoking status: Former Smoker    Packs/day: 0.20    Years: 4.00    Pack years: 0.80    Types: Cigarettes    Quit date: 05/13/1995    Years since quitting: 23.6  . Smokeless tobacco: Never Used  Substance and Sexual Activity  . Alcohol use: Not Currently  . Drug use: No  . Sexual activity: Not Currently  Lifestyle  . Physical activity    Days per week: Not on file    Minutes per session: Not on file  . Stress: Not on file  Relationships  . Social Herbalist on phone: Not on file    Gets together: Not on file    Attends religious service: Not on file    Active member of club or organization: Not on file    Attends meetings of clubs or organizations: Not on file    Relationship status: Not on file  . Intimate partner violence    Fear of current or ex partner: Not on file    Emotionally abused: Not on file    Physically abused: Not on file    Forced sexual activity: Not on file  Other Topics Concern  . Not on file  Social History Narrative   Widow , lives by herself   Lost a son, 3 living    No children in [place], [place] lives in [place], he visits and helps w/ shopping    Lost husband     ROS Review of Systems  Constitutional: Positive for activity change and fatigue. Negative for fever.  HENT: Negative for congestion and sore throat.   Respiratory: Negative for cough.   Cardiovascular: Negative for chest pain.  Gastrointestinal: Negative for nausea.  Genitourinary: Negative for dysuria.  Musculoskeletal: Positive for arthralgias and back pain.  Allergic/Immunologic: Negative for food allergies.  Neurological: Negative for dizziness and headaches.    Objective:   Today's Vitals: BP (!) 180/101 (BP Location: Left Arm, Patient Position: Sitting, Cuff Size: Normal)   Pulse 81   Temp 97.6 F (36.4 C) (Oral)   Ht 5\' 9"  (1.753 m)   Wt 143 lb (64.9 kg)   SpO2 97%   BMI 21.12 kg/m   Physical Exam Constitutional:  Appearance: Normal appearance. She is normal weight.  HENT:     Head: Atraumatic.  Eyes:     Conjunctiva/sclera: Conjunctivae normal.  Cardiovascular:     Rate and Rhythm: Normal rate and regular rhythm.     Pulses: Normal pulses.     Heart sounds: Normal heart sounds.  Pulmonary:     Effort: Pulmonary effort is normal.     Breath sounds: Normal breath sounds.  Neurological:     Mental Status: She is alert and oriented to person, place, and time.     Assessment & Plan:    Outpatient Encounter Medications as of 12/21/2018  Medication Sig  . predniSONE (DELTASONE) 5 MG tablet Take 10 mg by mouth daily with breakfast.  . Accu-Chek Softclix Lancets lancets USE TO CHECK BLOOD SUGAR TWICE A DAY  . albuterol (VENTOLIN HFA) 108 (90 Base) MCG/ACT inhaler Inhale 2 puffs into the lungs every 4 (four) hours as needed for wheezing or shortness of breath.  Marland Kitchen alum & mag hydroxide-simeth (MAALOX/MYLANTA) 200-200-20 MG/5ML suspension Take 30 mLs by mouth every 4 (four) hours as needed for indigestion.  Marland Kitchen amLODipine (NORVASC) 10 MG tablet Take 1 tablet (10 mg total) by mouth daily.  Marland Kitchen aspirin EC 81 MG tablet Take 1 tablet (81 mg total) by mouth daily.  .  Blood Glucose Monitoring Suppl (ACCU-CHEK AVIVA) device Use as instructed to check blood sugar twice daily dx E11.9  . colchicine 0.6 MG tablet Take 1 tablet (0.6 mg total) by mouth daily.  Marland Kitchen escitalopram (LEXAPRO) 20 MG tablet Take 1 tablet (20 mg total) by mouth daily.  . Fluticasone-Salmeterol (ADVAIR) 250-50 MCG/DOSE AEPB Inhale 2 puffs into the lungs daily.  Marland Kitchen glucose blood (ACCU-CHEK AVIVA PLUS) test strip TEST BLOOD SUGAR TWICE DAILY AND LANCETS TWICE DAILY E11.9  . latanoprost (XALATAN) 0.005 % ophthalmic solution Place 1 drop into both eyes at bedtime.  Marland Kitchen levothyroxine (SYNTHROID) 75 MCG tablet Take 1 tablet (75 mcg total) by mouth daily before breakfast.  . metoprolol tartrate (LOPRESSOR) 25 MG tablet TAKE 2 TABLETS BY MOUTH TWICE DAILY (Patient taking differently: Take 50 mg by mouth 2 (two) times daily. TAKE 2 TABLETS BY MOUTH TWICE DAILY)  . ondansetron (ZOFRAN) 4 MG tablet Take 1 tablet (4 mg total) by mouth every 6 (six) hours as needed for nausea.  Marland Kitchen OVER THE COUNTER MEDICATION CPAP: At bedtime  . OXYGEN Inhale 2 L into the lungs continuous.   . pantoprazole (PROTONIX) 40 MG tablet Take 1 tablet (40 mg total) by mouth 2 (two) times daily before a meal.  . potassium chloride SA (K-DUR) 20 MEQ tablet Take 1 tablet (20 mEq total) by mouth 2 (two) times daily.   No facility-administered encounter medications on file as of 12/21/2018.   1. Hypothyroidism, unspecified type TSH-results pending, 1/20 7.63 - Ambulatory referral to Endocrinology Pt requested referral 2. Controlled type 2 diabetes mellitus with stage 1 chronic kidney disease, without long-term current use of insulin ([place]) Pt requested  - Ambulatory referral to Endocrinology 3. Anemia, unspecified type Gastro following-repeat iron, TIBC, Ferritin B12 level pending 4. Numbness and tingling in both hands Seeing rheumatology - Vitamin B12 5. Gastroesophageal reflux disease without esophagitis protonix 6. Essential  hypertension Amlodipine/lopressor-pt seeing cardio for follow up Concern for prednisone with blood pressure elevation Pt will obtain bp cuff and take daily 7. Depression, unspecified depression type lexapro-declines counseling-increase in symptoms d/w pt-no SI, good support system Follow-up: endocrinology Code for time 45 min discussion on DM, hypothyroid,  GERD, Depression symptoms increasing, GERD treatment and GI followup, numbness with rheumatology ongoing treatment  Sheleen Conchas Hannah Beat, MD

## 2018-12-23 ENCOUNTER — Encounter: Payer: Self-pay | Admitting: General Surgery

## 2018-12-23 ENCOUNTER — Ambulatory Visit (INDEPENDENT_AMBULATORY_CARE_PROVIDER_SITE_OTHER): Payer: Medicare Other | Admitting: General Surgery

## 2018-12-23 ENCOUNTER — Encounter: Payer: Self-pay | Admitting: Family Medicine

## 2018-12-23 ENCOUNTER — Other Ambulatory Visit: Payer: Self-pay

## 2018-12-23 VITALS — BP 175/107 | HR 79 | Temp 97.3°F | Resp 18 | Ht 69.0 in | Wt 144.0 lb

## 2018-12-23 DIAGNOSIS — K8020 Calculus of gallbladder without cholecystitis without obstruction: Secondary | ICD-10-CM

## 2018-12-23 DIAGNOSIS — R945 Abnormal results of liver function studies: Secondary | ICD-10-CM | POA: Diagnosis not present

## 2018-12-23 DIAGNOSIS — R7989 Other specified abnormal findings of blood chemistry: Secondary | ICD-10-CM

## 2018-12-23 DIAGNOSIS — Z7689 Persons encountering health services in other specified circumstances: Secondary | ICD-10-CM | POA: Diagnosis not present

## 2018-12-23 NOTE — Patient Instructions (Signed)
Fatty Liver Disease  Fatty liver disease occurs when too much fat has built up in your liver cells. Fatty liver disease is also called hepatic steatosis or steatohepatitis. The liver removes harmful substances from your bloodstream and produces fluids that your body needs. It also helps your body use and store energy from the food you eat. In many cases, fatty liver disease does not cause symptoms or problems. It is often diagnosed when tests are being done for other reasons. However, over time, fatty liver can cause inflammation that may lead to more serious liver problems, such as scarring of the liver (cirrhosis) and liver failure. Fatty liver is associated with insulin resistance, increased body fat, high blood pressure (hypertension), and high cholesterol. These are features of metabolic syndrome and increase your risk for stroke, diabetes, and heart disease. What are the causes? This condition may be caused by:  Drinking too much alcohol.  Poor nutrition.  Obesity.  Cushing's syndrome.  Diabetes.  High cholesterol.  Certain drugs.  Poisons.  Some viral infections.  Pregnancy. What increases the risk? You are more likely to develop this condition if you:  Abuse alcohol.  Are overweight.  Have diabetes.  Have hepatitis.  Have a high triglyceride level.  Are pregnant. What are the signs or symptoms? Fatty liver disease often does not cause symptoms. If symptoms do develop, they can include:  Fatigue.  Weakness.  Weight loss.  Confusion.  Abdominal pain.  Nausea and vomiting.  Yellowing of your skin and the white parts of your eyes (jaundice).  Itchy skin. How is this diagnosed? This condition may be diagnosed by:  A physical exam and medical history.  Blood tests.  Imaging tests, such as an ultrasound, CT scan, or MRI.  A liver biopsy. A small sample of liver tissue is removed using a needle. The sample is then looked at under a microscope. How  is this treated? Fatty liver disease is often caused by other health conditions. Treatment for fatty liver may involve medicines and lifestyle changes to manage conditions such as:  Alcoholism.  High cholesterol.  Diabetes.  Being overweight or obese. Follow these instructions at home:   Do not drink alcohol. If you have trouble quitting, ask your health care provider how to safely quit with the help of medicine or a supervised program. This is important to keep your condition from getting worse.  Eat a healthy diet as told by your health care provider. Ask your health care provider about working with a diet and nutrition specialist (dietitian) to develop an eating plan.  Exercise regularly. This can help you lose weight and control your cholesterol and diabetes. Talk to your health care provider about an exercise plan and which activities are best for you.  Take over-the-counter and prescription medicines only as told by your health care provider.  Keep all follow-up visits as told by your health care provider. This is important. Contact a health care provider if: You have trouble controlling your:  Blood sugar. This is especially important if you have diabetes.  Cholesterol.  Drinking of alcohol. Get help right away if:  You have abdominal pain.  You have jaundice.  You have nausea and vomiting.  You vomit blood or material that looks like coffee grounds.  You have stools that are black, tar-like, or bloody. Summary  Fatty liver disease develops when too much fat builds up in the cells of your liver.  Fatty liver disease often causes no symptoms or problems. However, over   time, fatty liver can cause inflammation that may lead to more serious liver problems, such as scarring of the liver (cirrhosis).  You are more likely to develop this condition if you abuse alcohol, are pregnant, are overweight, have diabetes, have hepatitis, or have high triglyceride levels.   Contact your health care provider if you have trouble controlling your weight, blood sugar, cholesterol, or drinking of alcohol. This information is not intended to replace advice given to you by your health care provider. Make sure you discuss any questions you have with your health care provider. Document Released: 06/13/2005 Document Revised: 04/10/2017 Document Reviewed: 02/04/2017 Elsevier Patient Education  2020 [place]. [place]ium If you have a gallbladder condition, you may have trouble digesting fats. Eating a low-fat diet can help reduce your symptoms, and may be helpful before and after having surgery to remove your gallbladder (cholecystectomy). Your health care provider may recommend that you work with a diet and nutrition specialist (dietitian) to help you reduce the amount of fat in your diet. What are tips for following this plan? General guidelines  Limit your fat intake to less than 30% of your total daily calories. If you eat around 1,800 calories each day, this is less than 60 grams (g) of fat per day.  Fat is an important part of a healthy diet. Eating a low-fat diet can make it hard to maintain a healthy body weight. Ask your dietitian how much fat, calories, and other nutrients you need each day.  Eat small, frequent meals throughout the day instead of three large meals.  Drink at least 8-10 cups of fluid a day. Drink enough fluid to keep your urine clear or pale yellow.  Limit alcohol intake to no more than 1 drink a day for nonpregnant women and 2 drinks a day for men. One drink equals 12 oz of beer, 5 oz of wine, or 1 oz of hard liquor. Reading food labels  Check Nutrition Facts on food labels for the amount of fat per serving. Choose foods with less than 3 grams of fat per serving. Shopping  Choose nonfat and low-fat healthy foods. Look for the words "nonfat," "low fat," or "fat free."  Avoid buying processed or prepackaged foods. Cooking   Cook using low-fat methods, such as baking, broiling, grilling, or boiling.  Cook with small amounts of healthy fats, such as olive oil, grapeseed oil, canola oil, or sunflower oil. What foods are recommended?   All fresh, frozen, or canned fruits and vegetables.  Whole grains.  Low-fat or non-fat (skim) milk and yogurt.  Lean meat, skinless poultry, fish, eggs, and beans.  Low-fat protein supplement powders or drinks.  Spices and herbs. What foods are not recommended?  High-fat foods. These include baked goods, fast food, fatty cuts of meat, ice cream, french toast, sweet rolls, pizza, cheese bread, foods covered with butter, creamy sauces, or cheese.  Fried foods. These include french fries, tempura, battered fish, breaded chicken, fried breads, and sweets.  Foods with strong odors.  Foods that cause bloating and gas. Summary  A low-fat diet can be helpful if you have a gallbladder condition, or before and after gallbladder surgery.  Limit your fat intake to less than 30% of your total daily calories. This is about 60 g of fat if you eat 1,800 calories each day.  Eat small, frequent meals throughout the day instead of three large meals. This information is not intended to replace advice given to you by your health  care provider. Make sure you discuss any questions you have with your health care provider. Document Released: 05/03/2013 Document Revised: 08/19/2018 Document Reviewed: 06/05/2016 Elsevier Patient Education  2020 [employer]. Cholelithiasis  Cholelithiasis is also called "gallstones." It is a kind of gallbladder disease. The gallbladder is an organ that stores a liquid (bile) that helps you digest fat. Gallstones may not cause symptoms (may be silent gallstones) until they cause a blockage, and then they can cause pain (gallbladder attack). Follow these instructions at home:  Take over-the-counter and prescription medicines only as told by your doctor.  Stay  at a healthy weight.  Eat healthy foods. This includes: ? Eating fewer fatty foods, like fried foods. ? Eating fewer refined carbs (refined carbohydrates). Refined carbs are breads and grains that are highly processed, like white bread and white rice. Instead, choose whole grains like whole-wheat bread and brown rice. ? Eating more fiber. Almonds, fresh fruit, and beans are healthy sources of fiber.  Keep all follow-up visits as told by your doctor. This is important. Contact a doctor if:  You have sudden pain in the upper right side of your belly (abdomen). Pain might spread to your right shoulder or your chest. This may be a sign of a gallbladder attack.  You feel sick to your stomach (are nauseous).  You throw up (vomit).  You have been diagnosed with gallstones that have no symptoms and you get: ? Belly pain. ? Discomfort, burning, or fullness in the upper part of your belly (indigestion). Get help right away if:  You have sudden pain in the upper right side of your belly, and it lasts for more than 2 hours.  You have belly pain that lasts for more than 5 hours.  You have a fever or chills.  You keep feeling sick to your stomach or you keep throwing up.  Your skin or the whites of your eyes turn yellow (jaundice).  You have dark-colored pee (urine).  You have light-colored poop (stool). Summary  Cholelithiasis is also called "gallstones."  The gallbladder is an organ that stores a liquid (bile) that helps you digest fat.  Silent gallstones are gallstones that do not cause symptoms.  A gallbladder attack may cause sudden pain in the upper right side of your belly. Pain might spread to your right shoulder or your chest. If this happens, contact your doctor.  If you have sudden pain in the upper right side of your belly that lasts for more than 2 hours, get help right away. This information is not intended to replace advice given to you by your health care provider. Make  sure you discuss any questions you have with your health care provider. Document Released: 10/15/2007 Document Revised: 04/10/2017 Document Reviewed: 01/13/2016 Elsevier Patient Education  2020 [employer].

## 2018-12-23 NOTE — Progress Notes (Signed)
Cardiology Office Note    Date:  12/24/2018   ID:  [name], DOB 03-Jul-1950, MRN [id number]  PCP:  Maryruth Hancock, MD  Cardiologist:  Fransico Him, MD  Electrophysiologist:  None   Chief Complaint: f/u chest pain  History of Present Illness:   [name] is a 68 y.o. 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  She has had a tumultuous history, complicated by intermittent noncompliance, transportation issues, poor insight, and disjointed follow-up.  She had a previous Myoview obtained 09/2016 that showed EF 45% with normal perfusion. Concurrent echocardiogram 09/2016 showed EF 55 to 60%, no wall motion abnormality. She was admitted on 10/19/2017 with acute onset of shortness of breath.CTA of the chest revealed multifocal ground-glass opacity with differentials include pulmonary edema versus infectious versus inflammatory etiology.There was also small to moderate pericardial effusion measuring up to 2 cm on the CTA. Hospital course was notable for elevated TSH requiring Synthroid adjustment and severe hypoglycemia.Cardiology service was consulted for pericardial effusion. Echocardiogram obtained on 10/20/2017 showed EF 55 to 60%, mild LVH, mild pericardial effusion.She eventually underwent cardiac catheterization 10/22/2017 showed normal coronaries, EF 55 to 65%. Due to low LVEDP on cath, Lasix was stopped. Due to concern of her symptoms related to pericarditis, patient was restarted on ibuprofen and colchicine. Over the next year it was recommended she f/u with rheumatology but she  failed to do so until finally having her first consultation earlier this month at [facility]. Per chart, there have been prior challenges getting someone to take her on as a patient due to noncompliance.  She was readmitted on 11/12/2017 with shortness of breath and recurrent chest discomfort. She had positive ANA, RNP, anti-Smith and, chromatin. 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 Pulmonology service recommended discontinuation of diltiazem as positive causative agent and a repeat chest CT in 8 weeks. Unfortunately, she was readmitted on 11/17/17 with generalized weakness and tachycardia.She was found to have a urinary tract infection and C-diff-negative diarrhea. VQ was negative for PE. On 11/19/17, she had unresponsive episode. Code stroke was activated and the patient was evaluated by neurology service.EEG was unremarkable. MRI of the brain was negative.She did develop acute renal failure felt to be prerenal.Renal ultrasound was normal. Her Micardis was stopped.She was eventually discharged home. She has also been noted to have severe weight loss of unclear cause.  Last summer in 12/2017 she continued to have chest pain worse with lying down or taking a deep breath. Repeat limited echo showed a trivial pericardial effusion posterior to the heart, improved from prior. When she saw Dr. Radford Pax 02/2018 she was still having chest discomfort. CRP and ESR remained persistently elevated. Cardiac MRI 03/25/19 showed normal EF 56%, normal RV, mildly dilated PA, mild almost circumferential pericardial effusion, pericardium with normal thickness but there was late gadolinium enhancement consistent with inflammation, findings felt consistent with acute pericarditis. She was asked to resume ibuprofen x 2 weeks and colchicine. When she saw Cecilie Kicks NP back in follow-up 04/2018 there were a mutitude of issues including diarrhea with colchicine, noncompliance with treatment for UTI,  noncompliance with potassium supplement, social issues with transportation and continued weight loss. She  CT done in the ER 05/02/19 which showed improved diffuse ground-glass opacities and lower lobe bronchiectasis, patulous esophagus and interstitial lung disease which can be associated with scleroderma. When I met her she was doing OK but mostly concerned about when the appointment would end so that she could go have breakfast. At last OV with Dr. Turner, she had run out of her colchicine - she was granted a 2 month refill but under the premise that if she had not seen rheumatology/primary care upon follow-up, she would require dismissal. Repeat echo 11/01/18 showed EF 55-60%, mild LVH, no other significant findings. Repeat cMRI was recommended but not yet performed because in the interim she was admitted 7/30-8/1 with abdominal pain, found to have acute cholecystitis treated medically. This was felt chronic in nature. Surgery was not felt required. Outpatient GI f/u was advised. Last labs 12/2018 showed Hgb 10.4, leukopenia of 2.0, K 3.6, Cr 0.69, elevated LFTs (AST/ALT), lipase wnl. She finally saw rheumatology in consult with panel of autoimmune labs sent. Colchicine was stopped and prednisone 10mg daily was started, with planned f/u in 1 month. ° °She returns for follow-up in the cardiology office today and remains a poor historian. As in prior visits, she is distracted and difficult to get a clear history from. She continues to have diffuse joint issues. She struggles with dyphagia, nausea, vomiting, and GERD being evaluated by GI. She has periodic chest pain when she gets upset but no exertional angina and no clear pleuritic component. There is no rub on exam. No palpitations, orthopnea, dyspnea. MRI has been rescheduled for later this month. She is upset that she has to see so many doctors. She does not have a working BP cuff at home. She relies on [employer] for transportation. Labs are pending with GI that  she saw yesterday. ° ° °Past Medical History:  °Diagnosis Date  °• Anterolisthesis   ° Grade 1, L4-5  °• Bronchospasm 05/28/2012  °• COPD (chronic obstructive pulmonary disease) (HCC)   °• DEGENERATIVE JOINT DISEASE 10/06/2006  °• DEPRESSION 09/26/2008  °• DIABETES MELLITUS 10/06/2006  °• Diverticulosis   ° 2008,2005  °• GERD (gastroesophageal reflux disease) 07/25/2013  °• HYPERLIPIDEMIA 01/11/2009  °• HYPERTENSION 10/06/2006  °• Hypokalemia   °• Hypomagnesemia   °• INSOMNIA 09/26/2008  °• Internal hemorrhoids   °• OBSTRUCTIVE SLEEP APNEA 06/23/2008  ° Severe OSA per sleep study 2010, Rx a CPAP  °• Pain in joint, multiple sites 11/10/2006  °• Pericardial effusion   °• Pericarditis   °• UNSPECIFIED ANEMIA 12/10/2009  °• UTI (urinary tract infection) 11/2017  ° ° °Past Surgical History:  °Procedure Laterality Date  °• BIOPSY  12/28/2017  ° Procedure: BIOPSY;  Surgeon: Mansouraty, Gabriel Jr., MD;  Location: [facility];  Service: Gastroenterology;;  °• COLONOSCOPY  08/01/2011  ° Procedure: COLONOSCOPY;  Surgeon: Robert D Kaplan, MD;  Location: [facility];  Service: Endoscopy;  Laterality: N/A;. hyperplastic polyp removed, hemorrhoids s/p banding, diverticulosis, next tcs 10 years.   °• ESOPHAGOGASTRODUODENOSCOPY (EGD) WITH PROPOFOL N/A 12/28/2017  ° Procedure: ESOPHAGOGASTRODUODENOSCOPY (EGD) WITH PROPOFOL;  Surgeon: Mansouraty, Gabriel Jr., MD;  Location: [facility] ENDOSCOPY;  Service: Gastroenterology;  Laterality: N/A; h.pylori gastiris, tortuous esophagus with esophageal bx (no EOE), medium sized hiatal hernia  °• LEFT HEART CATH AND CORONARY ANGIOGRAPHY N/A 10/22/2017  ° Procedure: LEFT HEART CATH AND CORONARY ANGIOGRAPHY;  Surgeon: Varanasi, Jayadeep S, MD;  Location: [facility] INVASIVE CV LAB;  Service: Cardiovascular;  Laterality: N/A;  °• Left knee replacement  07/2007  °•   07/2007   Right knee replacement  2005    Current Medications: Current Meds  Medication Sig   Accu-Chek Softclix Lancets lancets USE TO CHECK BLOOD SUGAR TWICE A DAY     albuterol (VENTOLIN HFA) 108 (90 Base) MCG/ACT inhaler Inhale 2 puffs into the lungs every 4 (four) hours as needed for wheezing or shortness of breath.   alum & mag hydroxide-simeth (MAALOX/MYLANTA) 200-200-20 MG/5ML suspension Take 30 mLs by mouth every 4 (four) hours as needed for indigestion.   amLODipine (NORVASC) 10 MG tablet Take 1 tablet (10 mg total) by mouth daily.   aspirin EC 81 MG tablet Take 1 tablet (81 mg total) by mouth daily.   Blood Glucose Monitoring Suppl (ACCU-CHEK AVIVA) device Use as instructed to check blood sugar twice daily dx E11.9   escitalopram (LEXAPRO) 20 MG tablet Take 1 tablet (20 mg total) by mouth daily.   Fluticasone-Salmeterol (ADVAIR) 250-50 MCG/DOSE AEPB Inhale 2 puffs into the lungs daily.   Fluticasone-Salmeterol (WIXELA INHUB) 250-50 MCG/DOSE AEPB Inhale 1 puff into the lungs 2 (two) times daily.   glucose blood (ACCU-CHEK AVIVA PLUS) test strip TEST BLOOD SUGAR TWICE DAILY AND LANCETS TWICE DAILY E11.9   latanoprost (XALATAN) 0.005 % ophthalmic solution Place 1 drop into both eyes at bedtime.   levothyroxine (SYNTHROID) 75 MCG tablet Take 1 tablet (75 mcg total) by mouth daily before breakfast.   metoprolol tartrate (LOPRESSOR) 25 MG tablet Take 50 mg by mouth 2 (two) times daily.   ondansetron (ZOFRAN) 4 MG tablet Take 1 tablet (4 mg total) by mouth every 6 (six) hours as needed for nausea.   OVER THE COUNTER MEDICATION CPAP: At bedtime   OXYGEN Inhale 2 L into the lungs continuous.    pantoprazole (PROTONIX) 40 MG tablet Take 40 mg by mouth as needed.   potassium chloride SA (K-DUR) 20 MEQ tablet Take 1 tablet (20 mEq total) by mouth 2 (two) times daily.   predniSONE (DELTASONE) 5 MG tablet Take 10 mg by mouth daily with breakfast.      Allergies:   Patient has no known allergies.   Social History   Socioeconomic History   Marital status: Widowed    Spouse name: Not on file   Number of children: 4   Years of education:  Not on file   Highest education level: Not on file  Occupational History   Occupation: disability    Employer: UNEMPLOYED  Social Designer, fashion/clothing strain: Not on file   Food insecurity    Worry: Not on file    Inability: Not on file   Transportation needs    Medical: Not on file    Non-medical: Not on file  Tobacco Use   Smoking status: Former Smoker    Packs/day: 0.20    Years: 4.00    Pack years: 0.80    Types: Cigarettes    Quit date: 05/13/1995    Years since quitting: 23.6   Smokeless tobacco: Never Used  Substance and Sexual Activity   Alcohol use: Not Currently   Drug use: No   Sexual activity: Not Currently  Lifestyle   Physical activity    Days per week: Not on file    Minutes per session: Not on file   Stress: Not on file  Relationships   Social connections    Talks on phone: Not on file    Gets together: Not on file    Attends religious service: Not on file  club or organization: Not on file  °  Attends meetings of clubs or organizations: Not on file  °  Relationship status: Not on file  °Other Topics Concern  °• Not on file  °Social History Narrative  ° Widow , lives by herself  ° Lost a son, 3 living   ° No children in [place], Kevin lives in [place], he visits and helps w/ shopping   ° Lost husband  °  ° °Family History:  °The patient's family history includes Asthma in her mother; Diabetes in her mother and another family member; Hypertension in her sister; Pancreatic cancer in her brother; Stroke in her mother. There is no history of Colon cancer, Prostate cancer, Breast cancer, Esophageal cancer, Liver disease, Rectal cancer, Stomach cancer, or Inflammatory bowel disease. ° °ROS:   °Please see the history of present illness.   °All other systems are reviewed and otherwise negative.  ° ° °EKGs/Labs/Other Studies Reviewed:   ° °Studies reviewed were summarized above.  ° °EKG:  EKG is not ordered today. Reviewed from  07/2018. ° °Recent Labs: °03/04/2018: NT-Pro BNP 1,747 °04/27/2018: Magnesium 1.8 °05/01/2018: B Natriuretic Peptide 258.1 °06/04/2018: TSH 7.63 °12/11/2018: ALT 152; BUN 8; Creatinine, Ser 0.69; Hemoglobin 10.4; Platelets 275; Potassium 3.6; Sodium 135  °Recent Lipid Panel °   °Component Value Date/Time  ° CHOL 94 11/18/2017 0012  ° TRIG 110 11/18/2017 0012  ° HDL 30 (L) 11/18/2017 0012  ° CHOLHDL 3.1 11/18/2017 0012  ° VLDL 22 11/18/2017 0012  ° LDLCALC 42 11/18/2017 0012  ° ° °PHYSICAL EXAM:   ° °VS:  BP (!) 162/98    Pulse 88    Ht 5' 9" (1.753 m)    Wt 145 lb 1.9 oz (65.8 kg)    SpO2 99%    BMI 21.43 kg/m²   BMI: Body mass index is 21.43 kg/m². ° °GEN: Cachectic AAF in no acute distress °HEENT: normocephalic, atraumatic °Neck: no JVD, carotid bruits, or masses °Cardiac: RRR; no murmurs, rubs, or gallops, no edema  °Respiratory:  clear to auscultation bilaterally, normal work of breathing °GI: soft, nontender, nondistended, + BS °MS: no deformity or atrophy °Skin: warm and dry, no rash °Neuro:  Alert and Oriented x 3, Strength and sensation are intact, follows commands °Psych: distracted, stoic ° °Wt Readings from Last 3 Encounters:  °12/24/18 145 lb 1.9 oz (65.8 kg)  °12/23/18 144 lb (65.3 kg)  °12/21/18 143 lb (64.9 kg)  °  ° °ASSESSMENT & PLAN:  ° °1. Chronic chest pain - atypical, no evidence of CAD on cath within the last year. At this point it is mostly associated with emotional upset. There may be inciting component of underlying immune illness/chronic pericarditis vs diffuse joint pains affecting sternum as well. Rheumatology has stopped colchicine per patient and started prednisone. Await cMRI as planned. No change in regimen made today. °2. History of pericarditis - cMRI pending. Steroids per rheumatology. Unfortunately she has so many other symptoms it is difficult to sort out where one system begins and another ends. °3. Unintentional weight loss - needs ongoing f/u with primary care for this. GI has  also ordered repeat thyroid studies as well. °4. Essential HTN - BP suboptimal recently. Will change metoprolol to carvedilol. She reports she was on this remotely but records indicate she self-discontinued it. She is open to retrying it. She needs a BP cuff. Will ask [facility] to see if they can provide one through social work and check BP in 1 week.   Further management of blood pressure should come through primary care because in Ms. Colgate's situation, disjointed care will only make her situation more complicated. ° °Disposition: F/u with Dr. Turner in 6 months. ° °Medication Adjustments/Labs and Tests Ordered: °Current medicines are reviewed at length with the patient today.  Concerns regarding medicines are outlined above. Medication changes, Labs and Tests ordered today are summarized above and listed in the Patient Instructions accessible in Encounters.  ° °Signed, °Dayna N Dunn, PA-C  °12/24/2018 4:07 PM    °[facility] °[address], [place], [place]  [address] °Phone: [phone]; Fax: [phone]  °

## 2018-12-23 NOTE — Progress Notes (Signed)
I was present with the medical student for this service. I personally verified the history of present illness, performed the physical exam, and made the plan for this encounter. I have verified the medical student's documentation and made modifications where appropriately. I have personally documented in my own words a brief history, physical, and plan below.     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 Cholecystectomy with risk and possible no benefit in symptoms. I want to discuss the risk and possibility of surgery with Rheumatologist, Dr. Gerilyn Nestle.   I spent over 25 minutes with the patient and family discussing the below issues and forming a plan.   Curlene Labrum, MD [facility] [address] [place], [place] [address] [phone] (office)    [facility] Surgical Clinic Note   HPI:  [name] is a 68 yo female with COPD, diabetes, GERD, hypertension, Possible scleroderma ?, that presents to clinic for a hospital follow up for concern for gallbladder disease. She was in the hospital on 12/09/18 for nausea and vomiting and pain. HIDA scan showed no evidence of acute cholecystitis, but there was some consideration for possible chronic cholecystitis versus biliary dyskinesia.  The patient has an odd symptom complex with complaints of food sticking in her esophagus, regurgitation of food, and pain in the epigastric area. She also has lost over 100 lbs since July 2019, which has not been intentional. Due to her possible diagnosis of scleroderma and the lack of symptoms consistent with traditional cholelithiasis/ chronic cholecystitis, I asked her to see her Rheumatologist for the first time then visit me in clinic to discuss her options further.  She was able to eat during her hospitalization and her pain improved.   She  does report that she does have some abdominal pain with eating, and finds that she has this worse at night. She also reports vomiting at night. Her LFTs during her hospitalization also demonstrated a transaminitis and elevated alkaline phosphatase but no elevated bilirubin.  She has a history of alcohol abuse and some concern for fatty liver/possible cirrhotic liver on imaging.   She has had no fevers, chills or night sweats. She says that at night she has a burning chest pain.  She endorses shortness of breath on exertion and is on 3L of O2 at night. She also has COPD. She has a follow up MR chest on 8/26. She also has a MR cervical spine and US thyroid on 8/26. She currently is on '81mg'$  aspirin.   She endorses chest pain she describes as a pressure on her chest. Her last echo 10/2018 showed 55-60 EF with left ventricular hypertrophy and diastolic dysfunction with mildly reduced systolic function.   On 8/10 she went to see Rheumatologist, Dr. Hermelinda Medicus, for the first time. She is unsure what she was diagnosed with but was prescribed 5 mg.   Review of Systems:  Review of Systems  Constitutional: Positive for weight loss. Negative for chills and fever.  Respiratory: Positive for shortness of breath.   Cardiovascular: Positive for chest pain.  Gastrointestinal: Positive for abdominal pain, heartburn, nausea and vomiting.   All other review of systems: otherwise negative   Vital Signs:  BP (!) 175/107 (BP Location: Left Arm, Patient Position: Sitting, Cuff Size: Normal)   Pulse 79   Temp (!) 97.3 F (36.3 C) (Tympanic)   Resp 18   Ht '5\' 9"'$  (1.753  m)   Wt 144 lb (65.3 kg)   SpO2 97%   BMI 21.27 kg/m    Physical Exam:  Physical Exam  Constitutional: She is oriented to person, place, and time and well-developed, well-nourished, and in no distress.  HENT:  Head: Atraumatic.  Eyes: Pupils are equal, round, and reactive to light.  Neck: Normal range of motion.  Cardiovascular:  Normal rate, regular rhythm and normal heart sounds.  Pulmonary/Chest: Effort normal.  Coarse breath sounds bilaterally at bases   Abdominal: Soft. Bowel sounds are normal. She exhibits no distension. There is abdominal tenderness in the right upper quadrant, epigastric area and left upper quadrant. There is no rebound and no guarding.  Musculoskeletal: Normal range of motion.  Neurological: She is alert and oriented to person, place, and time.  Skin: Skin is warm and dry.  Psychiatric: Mood, memory, affect and judgment normal.   Laboratory studies:  N/a On 12/11/18  WBC 2.0 (consistently leukopenic)  Alk phos 273, AST 94 and ALT 152, Hg 10.4  Imaging:  HIDA on 7/31:  CLINICAL DATA:  Abdominal pain, nausea and vomiting for 3 years.  EXAM: NUCLEAR MEDICINE HEPATOBILIARY IMAGING WITH GALLBLADDER EF  TECHNIQUE: Sequential images of the abdomen were obtained out to 60 minutes following intravenous administration of radiopharmaceutical. After slow intravenous infusion of 1.32 micrograms Cholecystokinin, gallbladder ejection fraction was determined.  RADIOPHARMACEUTICALS:  5.4 mCi Tc-25mCholetec IV  COMPARISON:  None.  FINDINGS: Prompt uptake and biliary excretion of activity by the liver is seen. Gallbladder activity is visualized, consistent with patency of cystic duct. Biliary activity passes into small bowel, consistent with patent common bile duct.  CCK was administered but there was no response to CCK and radiotracer continued to accumulate in the gallbladder after CCK. (At 60 min, normal ejection fraction is greater than 40%.)  IMPRESSION: Continued accumulation of radiotracer after CCK administration could be due to biliary dyskinesia, opioid use or chronic cholecystitis.   [place] RUQ on 7/30:   EXAM: ULTRASOUND ABDOMEN LIMITED RIGHT UPPER QUADRANT  COMPARISON:  None.  FINDINGS: Gallbladder:  Within the gallbladder, there is an echogenic focus which  moves in shadows consistent with cholelithiasis. The gallbladder wall is mildly thickened and mildly edematous. There is a slight degree of pericholecystic fluid. The patient is focally tender over the gallbladder. No sonographic Murphy sign noted by sonographer.  Common bile duct:  Diameter: 5 mm. No intrahepatic or extrahepatic biliary duct dilatation.  Liver:  No focal lesion identified. Liver echogenicity is increased. Liver contour is mildly lobular. Portal vein is patent on color Doppler imaging with normal direction of blood flow towards the liver.  Other: None.  IMPRESSION: 1. Cholelithiasis. Gallbladder wall is mildly thickened and edematous with subtle pericholecystic fluid. Patient is tender over the gallbladder. These are findings concerning for acute cholecystitis.  2. Increase in liver echogenicity, a finding felt to represent a degree of hepatic steatosis. No focal liver lesions evident. Liver contour mildly lobular. There may be a degree of underlying hepatic cirrhosis. Appropriate laboratory assessment in this regard advised.  Assessment:  Ms. Melissa Bornhorstis a 68yo female with COPD, diabetes, GERD, hypertension, possible scleroderma with a wide range of symptom complaints and over 100 lbs of weight loss in the last year.    She presented to clinic to discuss possible gallbladder disease, and her son Melissa Huhcame to the appointment also. She reports pain in the epigastric/ some RUQ, and  painesophagus at night with nausea, vomiting and some  regurgitation.   We discussed that it is possible that her pain is from gallbladder disease due ton stones versus chronic cholecystitis.  I have also warned that multiple of her symptoms are not explained by her gallbladder including such severe weight loss, esophagus pain and food sticking, and regurgitation of food.   We also discussed that with the possible diagnosis of scleroderma, I want to make sure that her  additional risk from cardiac and pulmonary complications are considered before we remove the gallbladder, especially given that it may not improve any of the symptoms, especially chronic nausea, vomiting, and may only help the epigastric / RUQ pain some.   She has work up pending with a cardiac MRI pending.     Plan:  - Discuss diagnosis of ?scleroderma with Dr. Hermelinda Medicus and any precautions that we should take versus referring to the patient to [facility] if she decides to have the cholecystectomy  -The patient sees Dr. Gerilyn Nestle in September per report  - Follow up with patient after her further imaging, cardiac MRI, etc   Future Appointments  Date Time Provider [place]  01/05/2019  2:00 PM [facility]-MR 1 [facility]-MRI [facility]  01/20/2019  1:15 PM Virl Cagey, MD RS-RS None  01/24/2019  1:40 PM Corum, Rex Kras, MD NS-NS None  01/25/2019  2:30 PM Nida, Marella Chimes, MD REA-REA None  04/04/2019  2:00 PM Dennis Bast, RN [facility]    All of the above recommendations were discussed with the patient and patient's family, and all of patient's and family's questions were answered to their expressed satisfaction.    Mai-Tram Jeffrie Lofstrom, MS3  Curlene Labrum, MD [facility] [address] [place], [place] [address] [phone] (office)

## 2018-12-24 ENCOUNTER — Encounter: Payer: Self-pay | Admitting: Physician Assistant

## 2018-12-24 ENCOUNTER — Telehealth: Payer: Self-pay | Admitting: *Deleted

## 2018-12-24 ENCOUNTER — Ambulatory Visit (INDEPENDENT_AMBULATORY_CARE_PROVIDER_SITE_OTHER): Payer: Medicare Other | Admitting: Physician Assistant

## 2018-12-24 VITALS — BP 162/98 | HR 88 | Ht 69.0 in | Wt 145.1 lb

## 2018-12-24 DIAGNOSIS — R079 Chest pain, unspecified: Secondary | ICD-10-CM | POA: Diagnosis not present

## 2018-12-24 DIAGNOSIS — G8929 Other chronic pain: Secondary | ICD-10-CM

## 2018-12-24 DIAGNOSIS — R634 Abnormal weight loss: Secondary | ICD-10-CM

## 2018-12-24 DIAGNOSIS — I10 Essential (primary) hypertension: Secondary | ICD-10-CM

## 2018-12-24 DIAGNOSIS — Z8679 Personal history of other diseases of the circulatory system: Secondary | ICD-10-CM

## 2018-12-24 MED ORDER — CARVEDILOL 25 MG PO TABS: 25.0000 mg | ORAL_TABLET | Freq: Two times a day (BID) | ORAL | 11 refills | Status: AC

## 2018-12-24 NOTE — Telephone Encounter (Signed)
[facility] Visit Initial Request  Date of Request ([place]):  December 24, 2018  Requesting Provider:  Melina Copa, PA-C    Agency Requested:    [facility] Contact:  Glory Buff, NP [address] [place], [place] [address] Phone #:  [phone] Fax #:  [phone]  Patient Demographic Information: Name:  [name] Age:  68 y.o.   DOB:  1951-04-01  MRN:  [id number]   Address:   [address] [place] [address]   Phone Numbers:   Home Phone [phone]  Mobile [phone]     Emergency Contact Information on File:   Contact Information    Name Relation Home Work [name] Other   [phone]   [name], [name] [phone]  [phone]      The above family members may be contacted for information on this patient (review DPR on file):  No    Patient Clinical Information:  Primary Care Provider:  Maryruth Hancock, MD  Primary Cardiologist:  Melissa Him, MD  Primary Electrophysiologist:  None   Past Medical Hx: [name]  has a past medical history of Anterolisthesis, Bronchospasm (05/28/2012), COPD (chronic obstructive pulmonary disease) ([place]), DEGENERATIVE JOINT DISEASE (10/06/2006), DEPRESSION (09/26/2008), DIABETES MELLITUS (10/06/2006), Diverticulosis, GERD (gastroesophageal reflux disease) (07/25/2013), HYPERLIPIDEMIA (01/11/2009), HYPERTENSION (10/06/2006), Hypokalemia, Hypomagnesemia, INSOMNIA (09/26/2008), Internal hemorrhoids, OBSTRUCTIVE SLEEP APNEA (06/23/2008), Pain in joint, multiple sites (11/10/2006), Pericardial effusion, Pericarditis, UNSPECIFIED ANEMIA (12/10/2009), and UTI (urinary tract infection) (11/2017).   Allergies: She has No Known Allergies.   Medications: Current Outpatient Medications on File Prior to Visit  Medication Sig  . Accu-Chek Softclix Lancets lancets USE TO CHECK BLOOD SUGAR TWICE A DAY  . albuterol (VENTOLIN HFA) 108 (90 Base) MCG/ACT inhaler Inhale 2 puffs  into the lungs every 4 (four) hours as needed for wheezing or shortness of breath.  Marland Kitchen alum & mag hydroxide-simeth (MAALOX/MYLANTA) 200-200-20 MG/5ML suspension Take 30 mLs by mouth every 4 (four) hours as needed for indigestion.  Marland Kitchen amLODipine (NORVASC) 10 MG tablet Take 1 tablet (10 mg total) by mouth daily.  Marland Kitchen aspirin EC 81 MG tablet Take 1 tablet (81 mg total) by mouth daily.  . Blood Glucose Monitoring Suppl (ACCU-CHEK AVIVA) device Use as instructed to check blood sugar twice daily dx E11.9  . carvedilol (COREG) 25 MG tablet Take 1 tablet (25 mg total) by mouth 2 (two) times daily.  Marland Kitchen escitalopram (LEXAPRO) 20 MG tablet Take 1 tablet (20 mg total) by mouth daily.  . Fluticasone-Salmeterol (ADVAIR) 250-50 MCG/DOSE AEPB Inhale 2 puffs into the lungs daily.  . Fluticasone-Salmeterol (WIXELA INHUB) 250-50 MCG/DOSE AEPB Inhale 1 puff into the lungs 2 (two) times daily.  Marland Kitchen glucose blood (ACCU-CHEK AVIVA PLUS) test strip TEST BLOOD SUGAR TWICE DAILY AND LANCETS TWICE DAILY E11.9  . latanoprost (XALATAN) 0.005 % ophthalmic solution Place 1 drop into both eyes at bedtime.  Marland Kitchen levothyroxine (SYNTHROID) 75 MCG tablet Take 1 tablet (75 mcg total) by mouth daily before breakfast.  . ondansetron (ZOFRAN) 4 MG tablet Take 1 tablet (4 mg total) by mouth every 6 (six) hours as needed for nausea.  Marland Kitchen OVER THE COUNTER MEDICATION CPAP: At bedtime  . OXYGEN Inhale 2 L into the lungs continuous.   . pantoprazole (PROTONIX) 40 MG tablet Take 40 mg by mouth as needed.  . potassium chloride SA (K-DUR) 20 MEQ tablet Take 1 tablet (20 mEq total) by mouth 2 (two) times daily.  Marland Kitchen  predniSONE (DELTASONE) 5 MG tablet Take 10 mg by mouth daily with breakfast.   No current facility-administered medications on file prior to visit.      Social Hx: She  reports that she quit smoking about 23 years ago. Her smoking use included cigarettes. She has a 0.80 pack-year smoking history. She has never used smokeless tobacco. She  reports previous alcohol use. She reports that she does not use drugs.    Diagnosis/Reason for Visit:   PLEASE PROVIDE PT WITH HER OWN BLOOD PRESSURE CUFF  Services Requested:  Vital Signs (BP, Pulse, O2, Weight)  # of Visits Needed/Frequency per Week: 1

## 2018-12-24 NOTE — Patient Instructions (Signed)
Medication Instructions:  Your physician has recommended you make the following change in your medication:  1.  STOP the Metoprolol 2.  START Carvedilol 25 mg taking 1 tablet twice a day   If you need a refill on your cardiac medications before your next appointment, please call your pharmacy.   Lab work: None ordered  If you have labs (blood work) drawn today and your tests are completely normal, you will receive your results only by: Marland Kitchen MyChart Message (if you have MyChart) OR . A paper copy in the mail If you have any lab test that is abnormal or we need to change your treatment, we will call you to review the results.  Testing/Procedures: None ordered  Follow-Up: At [facility], you and your health needs are our priority.  As part of our continuing mission to provide you with exceptional heart care, we have created designated Provider Care Teams.  These Care Teams include your primary Cardiologist (physician) and Advanced Practice Providers (APPs -  Physician Assistants and Nurse Practitioners) who all work together to provide you with the care you need, when you need it. You will need a follow up appointment in 6 months.  Please call our office 2 months in advance to schedule this appointment.  You may see Fransico Him, MD or one of the following Advanced Practice Providers on your designated Care Team:   [place], PA-C Melina Copa, PA-C . Ermalinda Barrios, PA-C  Any Other Special Instructions Will Be Listed Below (If Applicable).  We will have remote health contact you to come and check your blood pressure and provide you with a blood pressure cuff.

## 2018-12-25 DIAGNOSIS — R200 Anesthesia of skin: Secondary | ICD-10-CM | POA: Insufficient documentation

## 2018-12-25 DIAGNOSIS — R202 Paresthesia of skin: Secondary | ICD-10-CM | POA: Insufficient documentation

## 2018-12-28 ENCOUNTER — Encounter: Payer: Self-pay | Admitting: *Deleted

## 2018-12-28 ENCOUNTER — Telehealth: Payer: Self-pay | Admitting: Licensed Clinical Social Worker

## 2018-12-28 ENCOUNTER — Other Ambulatory Visit: Payer: Self-pay | Admitting: Family Medicine

## 2018-12-28 DIAGNOSIS — R202 Paresthesia of skin: Secondary | ICD-10-CM | POA: Diagnosis not present

## 2018-12-28 DIAGNOSIS — R200 Anesthesia of skin: Secondary | ICD-10-CM | POA: Diagnosis not present

## 2018-12-28 DIAGNOSIS — D649 Anemia, unspecified: Secondary | ICD-10-CM | POA: Diagnosis not present

## 2018-12-28 DIAGNOSIS — R945 Abnormal results of liver function studies: Secondary | ICD-10-CM | POA: Diagnosis not present

## 2018-12-28 DIAGNOSIS — K810 Acute cholecystitis: Secondary | ICD-10-CM | POA: Diagnosis not present

## 2018-12-28 DIAGNOSIS — R112 Nausea with vomiting, unspecified: Secondary | ICD-10-CM | POA: Diagnosis not present

## 2018-12-28 LAB — CBC WITH DIFFERENTIAL/PLATELET
Absolute Monocytes: 299 cells/uL (ref 200–950)
Basophils Absolute: 9 cells/uL (ref 0–200)
Basophils Relative: 0.3 %
Eosinophils Absolute: 29 cells/uL (ref 15–500)
Eosinophils Relative: 1 %
HCT: 29.8 % — ABNORMAL LOW (ref 35.0–45.0)
Hemoglobin: 9.9 g/dL — ABNORMAL LOW (ref 11.7–15.5)
Lymphs Abs: 606 cells/uL — ABNORMAL LOW (ref 850–3900)
MCH: 27.1 pg (ref 27.0–33.0)
MCHC: 33.2 g/dL (ref 32.0–36.0)
MCV: 81.6 fL (ref 80.0–100.0)
MPV: 11.3 fL (ref 7.5–12.5)
Monocytes Relative: 10.3 %
Neutro Abs: 1958 cells/uL (ref 1500–7800)
Neutrophils Relative %: 67.5 %
Platelets: 275 10*3/uL (ref 140–400)
RBC: 3.65 10*6/uL — ABNORMAL LOW (ref 3.80–5.10)
RDW: 16.3 % — ABNORMAL HIGH (ref 11.0–15.0)
Total Lymphocyte: 20.9 %
WBC: 2.9 10*3/uL — ABNORMAL LOW (ref 3.8–10.8)

## 2018-12-28 LAB — IRON,TIBC AND FERRITIN PANEL
%SAT: 12 % (calc) — ABNORMAL LOW (ref 16–45)
Ferritin: 201 ng/mL (ref 16–288)
Iron: 19 ug/dL — ABNORMAL LOW (ref 45–160)
TIBC: 157 mcg/dL (calc) — ABNORMAL LOW (ref 250–450)

## 2018-12-28 LAB — TSH+FREE T4: TSH W/REFLEX TO FT4: 1.79 mIU/L (ref 0.40–4.50)

## 2018-12-28 LAB — COMPREHENSIVE METABOLIC PANEL
AG Ratio: 0.9 (calc) — ABNORMAL LOW (ref 1.0–2.5)
ALT: 6 U/L (ref 6–29)
AST: 15 U/L (ref 10–35)
Albumin: 3.4 g/dL — ABNORMAL LOW (ref 3.6–5.1)
Alkaline phosphatase (APISO): 89 U/L (ref 37–153)
BUN: 16 mg/dL (ref 7–25)
CO2: 27 mmol/L (ref 20–32)
Calcium: 9 mg/dL (ref 8.6–10.4)
Chloride: 103 mmol/L (ref 98–110)
Creat: 0.83 mg/dL (ref 0.50–0.99)
Globulin: 3.8 g/dL (calc) — ABNORMAL HIGH (ref 1.9–3.7)
Glucose, Bld: 74 mg/dL (ref 65–139)
Potassium: 4 mmol/L (ref 3.5–5.3)
Sodium: 139 mmol/L (ref 135–146)
Total Bilirubin: 0.5 mg/dL (ref 0.2–1.2)
Total Protein: 7.2 g/dL (ref 6.1–8.1)

## 2018-12-28 LAB — LIPASE: Lipase: 50 U/L (ref 7–60)

## 2018-12-28 NOTE — Telephone Encounter (Signed)
CSW referred to assist patient with obtaining a BP cuff. CSW contacted patient to inform cuff will be delivered to home. Patient grateful for support and assistance. CSW available as needed. Jackie Hilma Steinhilber, LCSW, CCSW-MCS [phone]  

## 2018-12-29 ENCOUNTER — Encounter: Payer: Self-pay | Admitting: Family Medicine

## 2018-12-29 LAB — VITAMIN B12: Vitamin B-12: 341 pg/mL (ref 200–1100)

## 2019-01-05 ENCOUNTER — Other Ambulatory Visit: Payer: Self-pay

## 2019-01-05 ENCOUNTER — Ambulatory Visit (HOSPITAL_COMMUNITY)
Admission: RE | Admit: 2019-01-05 | Discharge: 2019-01-05 | Disposition: A | Discharge status: Home / Self Care | Payer: Medicare Other | Source: Ambulatory Visit | Attending: Cardiology | Admitting: Cardiology

## 2019-01-05 ENCOUNTER — Inpatient Hospital Stay (HOSPITAL_COMMUNITY): Admission: RE | Admit: 2019-01-05 | Payer: Medicare Other | Source: Ambulatory Visit

## 2019-01-05 DIAGNOSIS — Z7689 Persons encountering health services in other specified circumstances: Secondary | ICD-10-CM | POA: Diagnosis not present

## 2019-01-05 DIAGNOSIS — R079 Chest pain, unspecified: Secondary | ICD-10-CM | POA: Diagnosis not present

## 2019-01-05 DIAGNOSIS — I313 Pericardial effusion (noninflammatory): Secondary | ICD-10-CM | POA: Diagnosis not present

## 2019-01-05 MED ORDER — GADOBUTROL 1 MMOL/ML IV SOLN
10.0000 mL | Freq: Once | INTRAVENOUS | Status: AC | PRN
  Administered 2019-01-05: 15:00:00 10 mL via INTRAVENOUS

## 2019-01-06 ENCOUNTER — Other Ambulatory Visit: Payer: Self-pay

## 2019-01-06 ENCOUNTER — Telehealth: Payer: Self-pay | Admitting: *Deleted

## 2019-01-06 ENCOUNTER — Telehealth: Payer: Self-pay

## 2019-01-06 DIAGNOSIS — I300 Acute nonspecific idiopathic pericarditis: Secondary | ICD-10-CM

## 2019-01-06 DIAGNOSIS — I421 Obstructive hypertrophic cardiomyopathy: Secondary | ICD-10-CM

## 2019-01-06 NOTE — Telephone Encounter (Signed)
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  Please find out who her rheumatologist is and send a copy of this to them.  The effusion is likely autoimmune driven and she is on steroids.  Please send this note to her rheumatologist to see if they are ok with placing back on Colchicine as well.  Please find out if patient has a hx of syncope or family hx of sudden cardiac death.  Please get a 24 hour HOlter to assess for silent ventricular arrhythmias and also refer to genetic clinic for evaluation of HOCM.  She needs to have any siblings and children scanned with echo to look for HOCM

## 2019-01-06 NOTE — Telephone Encounter (Addendum)
Spoke with the pt re: Dr. Theodosia Blender request from her Cardiac MRI results.. pt sees Dr. Hermelinda Medicus (Dr. Earnest Conroy)  in [facility] for Rheumatology.. will forward the report and Dr. Theodosia Blender note to her..   Pt agrees to seeing Dr. Broadus John for familial genetic evaluation and to wearing a Holter monitor.   Pt says that she does not have a history of Syncope or sudden death in her family as far as she knows and can think back.  Will call if she has any further questions.    Dr. Hermelinda Medicus [phone]  FAX [phone].

## 2019-01-06 NOTE — Progress Notes (Unsigned)
[name]

## 2019-01-06 NOTE — Telephone Encounter (Signed)
3 day ZIO XT long term holter monitor to be mailed to patients home.  Instructions reviewed briefly and they are included in the monitor kit.  Patient is aware Dr. Radford Pax would like her to wear the monitor for at least 24 hours.  Patient instructed to mail back to monitor company ASAP in prepaid postage box included in monitor kit.  Dr. Radford Pax should have her results 5-7 days after monitor has been mailed back.

## 2019-01-11 DIAGNOSIS — R0902 Hypoxemia: Secondary | ICD-10-CM | POA: Diagnosis not present

## 2019-01-11 DIAGNOSIS — G4733 Obstructive sleep apnea (adult) (pediatric): Secondary | ICD-10-CM | POA: Diagnosis not present

## 2019-01-13 ENCOUNTER — Ambulatory Visit (INDEPENDENT_AMBULATORY_CARE_PROVIDER_SITE_OTHER): Payer: Medicare Other

## 2019-01-13 DIAGNOSIS — I421 Obstructive hypertrophic cardiomyopathy: Secondary | ICD-10-CM | POA: Diagnosis not present

## 2019-01-13 DIAGNOSIS — I509 Heart failure, unspecified: Secondary | ICD-10-CM | POA: Diagnosis not present

## 2019-01-13 DIAGNOSIS — I300 Acute nonspecific idiopathic pericarditis: Secondary | ICD-10-CM | POA: Diagnosis not present

## 2019-01-13 DIAGNOSIS — M1380 Other specified arthritis, unspecified site: Secondary | ICD-10-CM | POA: Diagnosis not present

## 2019-01-13 DIAGNOSIS — I493 Ventricular premature depolarization: Secondary | ICD-10-CM | POA: Diagnosis not present

## 2019-01-19 DIAGNOSIS — I509 Heart failure, unspecified: Secondary | ICD-10-CM | POA: Diagnosis not present

## 2019-01-19 DIAGNOSIS — I2510 Atherosclerotic heart disease of native coronary artery without angina pectoris: Secondary | ICD-10-CM | POA: Diagnosis not present

## 2019-01-20 ENCOUNTER — Other Ambulatory Visit: Payer: Self-pay

## 2019-01-20 ENCOUNTER — Ambulatory Visit (INDEPENDENT_AMBULATORY_CARE_PROVIDER_SITE_OTHER): Payer: Medicare Other | Admitting: General Surgery

## 2019-01-20 ENCOUNTER — Encounter: Payer: Self-pay | Admitting: General Surgery

## 2019-01-20 VITALS — BP 97/65 | HR 86 | Temp 97.0°F | Resp 18 | Ht 69.0 in | Wt 146.0 lb

## 2019-01-20 DIAGNOSIS — K8020 Calculus of gallbladder without cholecystitis without obstruction: Secondary | ICD-10-CM | POA: Diagnosis not present

## 2019-01-20 DIAGNOSIS — R1110 Vomiting, unspecified: Secondary | ICD-10-CM

## 2019-01-20 DIAGNOSIS — Z7689 Persons encountering health services in other specified circumstances: Secondary | ICD-10-CM | POA: Diagnosis not present

## 2019-01-20 DIAGNOSIS — K219 Gastro-esophageal reflux disease without esophagitis: Secondary | ICD-10-CM

## 2019-01-20 NOTE — Progress Notes (Signed)
[facility] Note   HPI:  68 y.o. Female presents to clinic for follow-up evaluation to discuss possible cholecystectomy. Since her last visit, I did get information from Dr. Gerilyn Nestle about the patient's possible diagnosis which is lupus and possibly arthritis. She is getting prednisone treatment now and will likely move to methotrexate.  She has also since seen Dr. Radford Pax and had a cardiac MRI that demonstrated HOCM and had a Holter monitor in place this past week.  She is getting evaluated for this, and denies any family history of sudden death.   Patient continues to have regurgitation of food in the night and heartburn at 2AM. She has no abdominal pain or pain with greasy meals.    Review of Systems:  Nocturnal Regurgitation of food/ heartburn  All other review of systems: otherwise negative   Vital Signs:  BP 97/65 (BP Location: Left Arm, Patient Position: Sitting, Cuff Size: Normal)   Pulse 86   Temp (!) 97 F (36.1 C) (Tympanic)   Resp 18   Ht 5\' 9"  (1.753 m)   Wt 146 lb (66.2 kg)   SpO2 97%   BMI 21.56 kg/m    Physical Exam:  Physical Exam Vitals signs reviewed.  HENT:     Head: Normocephalic.  Eyes:     Extraocular Movements: Extraocular movements intact.  Cardiovascular:     Rate and Rhythm: Normal rate.  Pulmonary:     Effort: Pulmonary effort is normal.  Abdominal:     Palpations: Abdomen is soft.     Tenderness: There is no abdominal tenderness. There is no guarding or rebound.  Skin:    General: Skin is warm and dry.  Neurological:     General: No focal deficit present.     Mental Status: She is alert.  Psychiatric:        Mood and Affect: Mood normal.        Behavior: Behavior normal.    Assessment:  68 y.o. yo Female with choleilthiasis and possible some degree of chronic cholecystitis based on a HIDA scan. She had multiple other issues giong on at the same time and has been worked up for her rheumatologic issues and has had cardiac  evaluation. 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  At this time, none of her symptoms involve abdominal pain, RUQ pain, or any issues with eating greasy/ fatty foods. She does have specific and constant complaints of regurgitation of food and heartburn/ reflux in the middle of the night.  This is more related to esophageal motility than her gallbladder.   Plan:  - Holding on any cholecystectomy as the symptoms she complains about are likely not related   - Will touch base with Dr. Radford Pax regarding HOCM given any future need for cholecystectomy with new symptoms.  - Will touch base with Neil Crouch and let her know that again the symptoms are more in line with a esophageal motility / sphincter issue and likely needs more workup  - Follow up PRN   Future Appointments  Date Time Provider [place]  01/26/2019 11:30 AM Cassandria Anger, MD [facility] None  01/27/2019  1:40 PM Cor[place], Rex Kras, MD NS-NS None  02/17/2019  4:00 PM Debbe Mounts, PhD [facility]  04/04/2019  2:00 PM Dennis Bast, RN [facility]    All of the above recommendations were discussed with the patient, and all of patient's questions were answered to her expressed satisfaction.  Curlene Labrum, MD [facility] [address] [place], [place] [address] [phone] (office)

## 2019-01-20 NOTE — Patient Instructions (Signed)
Holding off on removal of the gallbladder at this time. Symptoms for regurgitation and reflux are the most prominent and not related to the gallbladder.

## 2019-01-21 ENCOUNTER — Other Ambulatory Visit: Payer: Self-pay

## 2019-01-21 DIAGNOSIS — Z7689 Persons encountering health services in other specified circumstances: Secondary | ICD-10-CM | POA: Diagnosis not present

## 2019-01-21 DIAGNOSIS — R1110 Vomiting, unspecified: Secondary | ICD-10-CM | POA: Insufficient documentation

## 2019-01-21 DIAGNOSIS — I421 Obstructive hypertrophic cardiomyopathy: Secondary | ICD-10-CM

## 2019-01-21 DIAGNOSIS — E1351 Other specified diabetes mellitus with diabetic peripheral angiopathy without gangrene: Secondary | ICD-10-CM | POA: Diagnosis not present

## 2019-01-21 DIAGNOSIS — L602 Onychogryphosis: Secondary | ICD-10-CM | POA: Diagnosis not present

## 2019-01-21 DIAGNOSIS — L84 Corns and callosities: Secondary | ICD-10-CM | POA: Diagnosis not present

## 2019-01-21 DIAGNOSIS — K8020 Calculus of gallbladder without cholecystitis without obstruction: Secondary | ICD-10-CM | POA: Insufficient documentation

## 2019-01-24 ENCOUNTER — Ambulatory Visit: Payer: Medicare Other | Admitting: Family Medicine

## 2019-01-24 DIAGNOSIS — I421 Obstructive hypertrophic cardiomyopathy: Secondary | ICD-10-CM | POA: Diagnosis not present

## 2019-01-24 DIAGNOSIS — I493 Ventricular premature depolarization: Secondary | ICD-10-CM | POA: Diagnosis not present

## 2019-01-24 DIAGNOSIS — I300 Acute nonspecific idiopathic pericarditis: Secondary | ICD-10-CM | POA: Diagnosis not present

## 2019-01-25 ENCOUNTER — Ambulatory Visit: Payer: Medicare Other | Admitting: "Endocrinology

## 2019-01-26 ENCOUNTER — Ambulatory Visit (INDEPENDENT_AMBULATORY_CARE_PROVIDER_SITE_OTHER): Payer: Medicare Other | Admitting: "Endocrinology

## 2019-01-26 ENCOUNTER — Encounter: Payer: Self-pay | Admitting: "Endocrinology

## 2019-01-26 ENCOUNTER — Other Ambulatory Visit: Payer: Self-pay | Admitting: Cardiology

## 2019-01-26 ENCOUNTER — Other Ambulatory Visit: Payer: Self-pay

## 2019-01-26 VITALS — BP 113/71 | HR 80 | Ht 69.0 in | Wt 149.0 lb

## 2019-01-26 DIAGNOSIS — I300 Acute nonspecific idiopathic pericarditis: Secondary | ICD-10-CM

## 2019-01-26 DIAGNOSIS — E042 Nontoxic multinodular goiter: Secondary | ICD-10-CM | POA: Diagnosis not present

## 2019-01-26 DIAGNOSIS — I421 Obstructive hypertrophic cardiomyopathy: Secondary | ICD-10-CM

## 2019-01-26 DIAGNOSIS — Z7689 Persons encountering health services in other specified circumstances: Secondary | ICD-10-CM | POA: Diagnosis not present

## 2019-01-26 DIAGNOSIS — E039 Hypothyroidism, unspecified: Secondary | ICD-10-CM

## 2019-01-26 DIAGNOSIS — I493 Ventricular premature depolarization: Secondary | ICD-10-CM

## 2019-01-26 DIAGNOSIS — E119 Type 2 diabetes mellitus without complications: Secondary | ICD-10-CM

## 2019-01-26 NOTE — Progress Notes (Signed)
Endocrinology Consult Note       01/26/2019, 4:53 PM   Subjective:    Patient ID: [name], female    DOB: 01-30-1951.  [name] is being seen in consultation for management of currently controlled asymptomatic diabetes as well as longstanding hypothyroidism requested by  Maryruth Hancock, MD.   Past Medical History:  Diagnosis Date  . Anterolisthesis    Grade 1, L4-5  . Bronchospasm 05/28/2012  . COPD (chronic obstructive pulmonary disease) ([place])   . DEGENERATIVE JOINT DISEASE 10/06/2006  . DEPRESSION 09/26/2008  . DIABETES MELLITUS 10/06/2006  . Diverticulosis    [id number]  . GERD (gastroesophageal reflux disease) 07/25/2013  . HYPERLIPIDEMIA 01/11/2009  . HYPERTENSION 10/06/2006  . Hypokalemia   . Hypomagnesemia   . INSOMNIA 09/26/2008  . Internal hemorrhoids   . OBSTRUCTIVE SLEEP APNEA 06/23/2008   Severe OSA per sleep study 2010, Rx a CPAP  . Pain in joint, multiple sites 11/10/2006  . Pericardial effusion   . Pericarditis   . UNSPECIFIED ANEMIA 12/10/2009  . UTI (urinary tract infection) 11/2017    Past Surgical History:  Procedure Laterality Date  . BIOPSY  12/28/2017   Procedure: BIOPSY;  Surgeon: Irving Copas., MD;  Location: [facility];  Service: Gastroenterology;;  . COLONOSCOPY  08/01/2011   Procedure: COLONOSCOPY;  Surgeon: Inda Castle, MD;  Location: [facility];  Service: Endoscopy;  Laterality: N/A;. hyperplastic polyp removed, hemorrhoids s/p banding, diverticulosis, next tcs 10 years.   . ESOPHAGOGASTRODUODENOSCOPY (EGD) WITH PROPOFOL N/A 12/28/2017   Procedure: ESOPHAGOGASTRODUODENOSCOPY (EGD) WITH PROPOFOL;  Surgeon: Rush Landmark Telford Nab., MD;  Location: [facility];  Service: Gastroenterology;  Laterality: N/A; h.pylori gastiris, tortuous esophagus with esophageal bx (no EOE), medium sized hiatal hernia  . LEFT HEART CATH AND CORONARY ANGIOGRAPHY N/A  10/22/2017   Procedure: LEFT HEART CATH AND CORONARY ANGIOGRAPHY;  Surgeon: Jettie Booze, MD;  Location: [facility];  Service: Cardiovascular;  Laterality: N/A;  . 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 Right knee replacement  2005    Social History   Socioeconomic History  . Marital status: Widowed    Spouse name: Not on file  . Number of children: 4  . Years of education: Not on file  . Highest education level: Not on file  Occupational History  . Occupation: disability    Employer: UNEMPLOYED  Social Needs  . Financial resource strain: Not on file  . Food insecurity    Worry: Not on file    Inability: Not on file  . Transportation needs    Medical: Not on file    Non-medical: Not on file  Tobacco Use  . Smoking status: Former Smoker    Packs/day: 0.20    Years: 4.00    Pack years: 0.80    Types: Cigarettes    Quit date: 05/13/1995    Years since quitting: 23.7  . Smokeless tobacco: Never Used  Substance and Sexual Activity  . Alcohol use: Not Currently  . Drug use: No  . Sexual activity: Not Currently  Lifestyle  . Physical activity    Days per week:  Not on file    Minutes per session: Not on file  . Stress: Not on file  Relationships  . Social Herbalist on phone: Not on file    Gets together: Not on file    Attends religious service: Not on file    Active member of club or organization: Not on file    Attends meetings of clubs or organizations: Not on file    Relationship status: Not on file  Other Topics Concern  . Not on file  Social History Narrative   Widow , lives by herself   Lost a son, 3 living    No children in [place], Brantley lives in [place], he visits and helps w/ shopping    Lost husband    Family History  Problem Relation Age of Onset  . Asthma Mother   . Stroke Mother   . Diabetes Mother   . Diabetes Other        M, B, S  . Hypertension Sister        M, S,B  . Pancreatic cancer Brother   . Colon cancer Neg Hx   .  Prostate cancer Neg Hx   . Breast cancer Neg Hx   . Esophageal cancer Neg Hx   . Liver disease Neg Hx   . Rectal cancer Neg Hx   . Stomach cancer Neg Hx   . Inflammatory bowel disease Neg Hx     Outpatient Encounter Medications as of 01/26/2019  Medication Sig  . Accu-Chek Softclix Lancets lancets USE TO CHECK BLOOD SUGAR TWICE A DAY  . albuterol (VENTOLIN HFA) 108 (90 Base) MCG/ACT inhaler Inhale 2 puffs into the lungs every 4 (four) hours as needed for wheezing or shortness of breath.  Marland Kitchen alum & mag hydroxide-simeth (MAALOX/MYLANTA) 200-200-20 MG/5ML suspension Take 30 mLs by mouth every 4 (four) hours as needed for indigestion.  Marland Kitchen amLODipine (NORVASC) 10 MG tablet Take 1 tablet (10 mg total) by mouth daily.  Marland Kitchen aspirin EC 81 MG tablet Take 1 tablet (81 mg total) by mouth daily.  . Blood Glucose Monitoring Suppl (ACCU-CHEK AVIVA) device Use as instructed to check blood sugar twice daily dx E11.9  . carvedilol (COREG) 25 MG tablet Take 1 tablet (25 mg total) by mouth 2 (two) times daily.  Marland Kitchen escitalopram (LEXAPRO) 20 MG tablet Take 1 tablet (20 mg total) by mouth daily.  . Fluticasone-Salmeterol (ADVAIR) 250-50 MCG/DOSE AEPB Inhale 2 puffs into the lungs daily. (Patient not taking: Reported on 01/26/2019)  . Fluticasone-Salmeterol (WIXELA INHUB) 250-50 MCG/DOSE AEPB Inhale 1 puff into the lungs 2 (two) times daily.  Marland Kitchen glucose blood (ACCU-CHEK AVIVA PLUS) test strip TEST BLOOD SUGAR TWICE DAILY AND LANCETS TWICE DAILY E11.9  . latanoprost (XALATAN) 0.005 % ophthalmic solution Place 1 drop into both eyes at bedtime.  Marland Kitchen levothyroxine (SYNTHROID) 75 MCG tablet Take 1 tablet (75 mcg total) by mouth daily before breakfast.  . ondansetron (ZOFRAN) 4 MG tablet Take 1 tablet (4 mg total) by mouth every 6 (six) hours as needed for nausea.  Marland Kitchen OVER THE COUNTER MEDICATION CPAP: At bedtime  . OXYGEN Inhale 2 L into the lungs continuous.   . pantoprazole (PROTONIX) 40 MG tablet Take 40 mg by mouth as needed.   . potassium chloride SA (K-DUR) 20 MEQ tablet Take 1 tablet (20 mEq total) by mouth 2 (two) times daily.  . predniSONE (DELTASONE) 5 MG tablet Take 10 mg by mouth daily with breakfast.   No  facility-administered encounter medications on file as of 01/26/2019.     ALLERGIES: No Known Allergies  VACCINATION STATUS: Immunization History  Administered Date(s) Administered  . Influenza Whole 05/12/2006, 03/15/2007  . Influenza, High Dose Seasonal PF 02/22/2015, 02/25/2016, 03/31/2017, 02/24/2018  . Influenza, Seasonal, Injecte, Preservative Fre 05/28/2012  . Influenza,inj,Quad PF,6+ Mos 05/12/2012, 04/25/2013, 06/01/2014  . Pneumococcal Conjugate-13 10/26/2013  . Pneumococcal Polysaccharide-23 05/28/2012  . Td 08/17/2006, 03/31/2017  . Zoster 10/29/2012    HPI  68 year old female patient with multiple medical problems as above.  She reports that she was previously diagnosed with type 2 diabetes which required insulin treatment.  However, more recent years her requirement for medications disappeared as she lost weight unintentionally.  She gives history of weighing as much as 315 pounds.  She has lost approximately 150 pounds of weight in about 18 months time.    -She with 149 pounds today.   She weighed 196 pounds in November 2019. Review of her medical records indicate that she underwent thyroid studies for work-up.  She underwent GI work-up with esophagogastroduodenoscopy, was remarkable for only chronic active gastritis with Helicobacter pylori organisms, no evidence of dysplasia or malignancy. She does on and off nausea, vomiting, no diarrhea.    She is not on any diabetes medications currently.  Her most recent A1c was 5.5%. She also has hypothyroidism which required support with thyroid hormone currently on levothyroxine 75 mcg p.o. every morning. She denies palpitations, tremors, nor heat intolerance.  She denies dysphagia, shortness of breath, nor voice change. She has poor  historian, does not seem to remember the details of her medical history.  She does not have good social support, lives alone.  She admits that she does not eat well, mostly did not go due to depression.   Review of Systems  Constitutional: + weight loss, + fatigue, no subjective hyperthermia, no subjective hypothermia Eyes: no blurry vision, no xerophthalmia ENT: no sore throat, no nodules palpated in throat, no dysphagia/odynophagia, no hoarseness Cardiovascular: no Chest Pain, no Shortness of Breath, no palpitations, no leg swelling Respiratory: no cough, no SOB Gastrointestinal: no Nausea/Vomiting/Diarhhea Musculoskeletal: + Diffuse body aches, status post left knee replacement, disequilibrium requiring walker candidate.   Skin: no rashes Neurological: no tremors, no numbness, no tingling, no dizziness Psychiatric: + depression, + anxiety   Objective:    BP 113/71   Pulse 80   Ht 5\' 9"  (1.753 m)   Wt 149 lb (67.6 kg)   BMI 22.00 kg/m   Wt Readings from Last 3 Encounters:  01/26/19 149 lb (67.6 kg)  01/20/19 146 lb (66.2 kg)  12/24/18 145 lb 1.9 oz (65.8 kg)     Physical Exam  Physical Exam- Limited  Constitutional:  Body mass index is 22 kg/m. , not in acute distress, normal state of mind, + dysphoric mood, chronically sick looking   - EYES: EOMI, no exophthalmos Neck: Supple, no gross  thyromegaly Respiratory: Adequate breathing efforts Musculoskeletal: no gross deformities, strength intact in all four extremities, no gross restriction of joint movements Skin:  no rashes, no hyperemia Neurological: no tremor with outstretched hands. + Uses a walker to ambulate.  CMP ( most recent) CMP     Component Value Date/Time   NA 139 12/28/2018 0955   NA 141 11/16/2018 0909   K 4.0 12/28/2018 0955   CL 103 12/28/2018 0955   CO2 27 12/28/2018 0955   GLUCOSE 74 12/28/2018 0955   BUN 16 12/28/2018 0955   BUN 10 11/16/2018  0909   CREATININE 0.83 12/28/2018 0955    CALCIUM 9.0 12/28/2018 0955   PROT 7.2 12/28/2018 0955   ALBUMIN 3.3 (L) 12/11/2018 0608   AST 15 12/28/2018 0955   ALT 6 12/28/2018 0955   ALKPHOS 273 (H) 12/11/2018 0608   BILITOT 0.5 12/28/2018 0955   GFRNONAA >60 12/11/2018 0608   GFRAA >60 12/11/2018 0608     Diabetic Labs (most recent): Lab Results  Component Value Date   HGBA1C 5.5 12/09/2018   HGBA1C 5.0 06/01/2018   HGBA1C 5.8 02/24/2018     Lipid Panel ( most recent) Lipid Panel     Component Value Date/Time   CHOL 94 11/18/2017 0012   TRIG 110 11/18/2017 0012   HDL 30 (L) 11/18/2017 0012   CHOLHDL 3.1 11/18/2017 0012   VLDL 22 11/18/2017 0012   LDLCALC 42 11/18/2017 0012      Lab Results  Component Value Date   TSH 7.63 (H) 06/04/2018   TSH 1.97 02/24/2018   TSH 11.593 (H) 12/22/2017   TSH 6.755 (H) 11/19/2017   TSH 11.758 (H) 11/18/2017   TSH 6.939 (H) 11/13/2017   TSH 7.175 (H) 10/20/2017   TSH 13.42 (H) 09/11/2017   TSH 28.45 (H) 08/07/2017   TSH 46.15 (H) 03/09/2017   FREET4 1.19 12/22/2017   FREET4 1.21 11/18/2017   FREET4 0.47 (L) 09/23/2016      Review of her records show multinodular goiter based on ultrasound from August 2019: Right lobe 5.1 cm x 1.9 cm x 2.3 cm,  2.2 cm nodule on the right  Lobe was amenable for biopsy, but no report card of biopsy on her thyroid. Left lobe 4.1 cm x 1.1 0.1 cm   Assessment & Plan:   1. Hypothyroidism, unspecified type 2. Type 2 diabetes mellitus without complication, without long-term current use of insulin ([facility]CC) - [name] has currently controlled asymptomatic diabetes with recent A1c of 5.5%.   -She will not require medication intervention for diabetes at this time.  Patient with apparent nutritional deficiency with relatively rapid unintended weight loss.   -She has psychosocial issues which interfere with her care, with no or nonexistent social support.  -I discussed with her to be more generous in taking carbs including pasta, rice,  ground meat, fruits. -She would benefit from evaluation by social services, for possible arrangement for home aide. -She will have a new set of thyroid function tests including TSH, free T4, as well as a.m. cortisol to evaluate for adrenal sufficiency in the next few days, and will be contacted if findings are abnormal.   Regarding her hypothyroidism, her recent thyroid function tests are consistent with under replacement.  However, she has been taking her levothyroxine randomly. -75 mcg may be appropriate for her current weight, she is advised to continue levothyroxine 75 mcg p.o. every morning.   - We discussed about the correct intake of her thyroid hormone, on empty stomach at fasting, with water, separated by at least 30 minutes from breakfast and other medications,  and separated by more than 4 hours from calcium, iron, multivitamins, acid reflux medications (PPIs). -Patient is made aware of the fact that thyroid hormone replacement is needed for life, dose to be adjusted by periodic monitoring of thyroid function tests.  Multinodular goiter: She has a documented, 2.2 cm suspicious nodule on her right lobe.  She will need fine-needle aspiration to rule out malignancy.  This will be scheduled to be done as soon as possible in [facility].  Follow up plan: - Return in about 3 months (around 04/27/2019) for Bring Meter and Logs- A1c in Office.  Glade Lloyd, MD [facility] [address] [place], [place] [address] Phone: [phone]  Fax: [phone]    01/26/2019, 4:53 PM  This note was partially dictated with voice recognition software. Similar sounding words can be transcribed inadequately or may not  be corrected upon review.

## 2019-01-27 ENCOUNTER — Other Ambulatory Visit: Payer: Self-pay | Admitting: "Endocrinology

## 2019-01-27 ENCOUNTER — Other Ambulatory Visit: Payer: Self-pay

## 2019-01-27 ENCOUNTER — Telehealth (INDEPENDENT_AMBULATORY_CARE_PROVIDER_SITE_OTHER): Payer: Self-pay | Admitting: Family Medicine

## 2019-01-27 ENCOUNTER — Ambulatory Visit (INDEPENDENT_AMBULATORY_CARE_PROVIDER_SITE_OTHER): Payer: Medicare Other | Admitting: Family Medicine

## 2019-01-27 ENCOUNTER — Other Ambulatory Visit: Payer: Self-pay | Admitting: Gastroenterology

## 2019-01-27 VITALS — BP 128/77 | HR 82 | Temp 97.9°F | Ht 69.0 in | Wt 150.4 lb

## 2019-01-27 DIAGNOSIS — Z7689 Persons encountering health services in other specified circumstances: Secondary | ICD-10-CM | POA: Diagnosis not present

## 2019-01-27 DIAGNOSIS — E042 Nontoxic multinodular goiter: Secondary | ICD-10-CM

## 2019-01-27 DIAGNOSIS — R1310 Dysphagia, unspecified: Secondary | ICD-10-CM | POA: Diagnosis not present

## 2019-01-27 DIAGNOSIS — R112 Nausea with vomiting, unspecified: Secondary | ICD-10-CM

## 2019-01-27 DIAGNOSIS — Z1239 Encounter for other screening for malignant neoplasm of breast: Secondary | ICD-10-CM | POA: Diagnosis not present

## 2019-01-27 DIAGNOSIS — K219 Gastro-esophageal reflux disease without esophagitis: Secondary | ICD-10-CM

## 2019-01-27 MED ORDER — PANTOPRAZOLE SODIUM 40 MG PO TBEC: 40.0000 mg | DELAYED_RELEASE_TABLET | Freq: Two times a day (BID) | ORAL | 5 refills | Status: DC

## 2019-01-27 NOTE — Patient Instructions (Addendum)
Will call for appointment scheduled with Gastroenterologist  Keep appt with rheumatology   Keep appt with biopsy for thyroid, mammogram  Get blood work completed while at hospital  Keep appointment with cardiology

## 2019-01-27 NOTE — Progress Notes (Signed)
Established Patient Office Visit  Subjective:  Patient ID: [name], female    DOB: 04/21/1951  Age: 68 y.o. MRN: [id number]  CC: hypothyroid Nausea/vomiting  HPI [name] presents for nausea/vomiting-noted q other night. Pt states trying to eat and drink better.pt states zofran and protonix twice a day not controlling nausea. General Surgery does not feel symptoms related to gallbladder. 8/19 EGD . Pt states she feels pills sticking in throat when she swallows and burning in throat. Pt chews her food with no difficulty.    Aid helps with cooking, cleaning and showering. Pt needed assistance since 2013    Type 2 diabetes mellitus without complication, without long-term current use of insulin ([place])  controlled asymptomatic diabetes with recent A1c of 5.5%. pt previously took insulin-no longer needed with weight loss. .  Patient with apparent nutritional deficiency with relatively rapid unintended weight loss.  Pt fell 8/22-physical therapy   Hypothyroidism-  levothyroxine 75 mcg p.o. every morning. TSH 1.79-additional labs pending  Multinodular goiter: She has a documented, 2.2 cm suspicious nodule on her right lobe.  She will need fine-needle aspiration to rule out malignancy.  AP to call pt and schedule   Hospital admission for  abd pain, vomiting, abnormal LFTs. RUQ U/S with findings concerning for acute cholecystitis. Liver with increased echogenicity and contour mildly lobular. HIDA with continued accumulation of radiotracer after CCK administration Labs : WBC 2.9, H/H 9.9/29.8  later, her AST 350 to 15 during August 94, ALT 269 to 66 Lipase 50 her iron was low at 19 , TIBC 157, %SAT 12, Ferritin 201 Hep B surface ag negative 2019. HCV Ab negative 2016. HIV NR 11/2018.   pts largest weight 316-today 150lbs pt  lost 100 pounds in one year, unintentional. Lost total of 166 pounds since 07/2017.    Pt  vomiting at 2am last night. Pt states she vomits  nightly. Pt states sometimes food stays down and sometimes she vomits food IMPRESSION:8-19-CT Abdomen 1. Colonic diverticulosis without acute diverticulitis nor acute intra-abdominal/pelvic process. 2. Bibasilar ground-glass opacities with subpleural sparing associated with nonspecific interstitial pneumonia. Bilateral lower lobe bronchiectasis. IMPRESSION:12/09/18-Gallbladder ultrasound 1. Cholelithiasis. Gallbladder wall is mildly thickened and edematous with subtle pericholecystic fluid. Patient is tender over the gallbladder. These are findings concerning for acute cholecystitis.  2. Increase in liver echogenicity, a finding felt to represent a degree of hepatic steatosis. No focal liver lesions evident. Liver contour mildly lobular. There may be a degree of underlying hepatic cirrhosis. Appropriate laboratory assessment in this regard advised. IMPRESSION:01/05/19 1. Normal LV size, moderate concentric hypertrophy, and normal systolic function ([id number]) 2.  Normal RV size and systolic function ([id number]) 3. Patchy late gadolinium enhancement at LV basal septum and inferior RV insertion site. This type of LGE pattern is often seen in hypertrophic cardiomyopathy. Her hypertrophy appears more concentric, though there is mild asymmetry (53mm in basal septum vs 14 mm in lateral wall). Would consider HCM, particularly if degree of hypertrophy is not explained by other causes 4. Small pericardial effusion, primarily located adjacent to LV free wall measuring up to 53mm. Normal pericardial thickness. No evidence of ventricular interdependence on real time imaging to suggest constriction. No pericardial LGE.  Past Medical History:  Diagnosis Date  . Anterolisthesis    Grade 1, L4-5  . Bronchospasm 05/28/2012  . COPD (chronic obstructive pulmonary disease) ([place])   . DEGENERATIVE JOINT DISEASE 10/06/2006  . DEPRESSION 09/26/2008  . DIABETES MELLITUS 10/06/2006  . Diverticulosis  [id number]  . GERD (gastroesophageal reflux disease) 07/25/2013  . HYPERLIPIDEMIA 01/11/2009  . HYPERTENSION 10/06/2006  . Hypokalemia   . Hypomagnesemia   . INSOMNIA 09/26/2008  . Internal hemorrhoids   . OBSTRUCTIVE SLEEP APNEA 06/23/2008   Severe OSA per sleep study 2010, Rx a CPAP  . Pain in joint, multiple sites 11/10/2006  . Pericardial effusion   . Pericarditis   . UNSPECIFIED ANEMIA 12/10/2009  . UTI (urinary tract infection) 11/2017    Past Surgical History:  Procedure Laterality Date  . BIOPSY  12/28/2017   Procedure: BIOPSY;  Surgeon: Irving Copas., MD;  Location: [facility];  Service: Gastroenterology;;  . COLONOSCOPY  08/01/2011   Procedure: COLONOSCOPY;  Surgeon: Inda Castle, MD;  Location: [facility];  Service: Endoscopy;  Laterality: N/A;. hyperplastic polyp removed, hemorrhoids s/p banding, diverticulosis, next tcs 10 years.   . ESOPHAGOGASTRODUODENOSCOPY (EGD) WITH PROPOFOL N/A 12/28/2017   Procedure: ESOPHAGOGASTRODUODENOSCOPY (EGD) WITH PROPOFOL;  Surgeon: Rush Landmark Telford Nab., MD;  Location: [facility];  Service: Gastroenterology;  Laterality: N/A; h.pylori gastiris, tortuous esophagus with esophageal bx (no EOE), medium sized hiatal hernia  . LEFT HEART CATH AND CORONARY ANGIOGRAPHY N/A 10/22/2017   Procedure: LEFT HEART CATH AND CORONARY ANGIOGRAPHY;  Surgeon: Jettie Booze, MD;  Location: [facility];  Service: Cardiovascular;  Laterality: N/A;  . 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 Right knee replacement  2005    Family History  Problem Relation Age of Onset  . Asthma Mother   . Stroke Mother   . Diabetes Mother   . Diabetes Other        M, B, S  . Hypertension Sister        M, S,B  . Pancreatic cancer Brother   . Colon cancer Neg Hx   . Prostate cancer Neg Hx   . Breast cancer Neg Hx   . Esophageal cancer Neg Hx   . Liver disease Neg Hx   . Rectal cancer Neg Hx   . Stomach cancer Neg Hx   . Inflammatory bowel disease Neg  Hx     Social History   Socioeconomic History  . Marital status: Widowed    Spouse name: Not on file  . Number of children: 4  . Years of education: Not on file  . Highest education level: Not on file  Occupational History  . Occupation: disability    Employer: UNEMPLOYED  Social Needs  . Financial resource strain: Not on file  . Food insecurity    Worry: Not on file    Inability: Not on file  . Transportation needs    Medical: Not on file    Non-medical: Not on file  Tobacco Use  . Smoking status: Former Smoker    Packs/day: 0.20    Years: 4.00    Pack years: 0.80    Types: Cigarettes    Quit date: 05/13/1995    Years since quitting: 23.7  . Smokeless tobacco: Never Used  Substance and Sexual Activity  . Alcohol use: Not Currently  . Drug use: No  . Sexual activity: Not Currently  Lifestyle  . Physical activity    Days per week: Not on file    Minutes per session: Not on file  . Stress: Not on file  Relationships  . Social Herbalist on phone: Not on file    Gets together: Not on file    Attends religious service: Not on file    Active  member of club or organization: Not on file    Attends meetings of clubs or organizations: Not on file    Relationship status: Not on file  . Intimate partner violence    Fear of current or ex partner: Not on file    Emotionally abused: Not on file    Physically abused: Not on file    Forced sexual activity: Not on file  Other Topics Concern  . Not on file  Social History Narrative   Widow , lives by herself   Lost a son, 3 living    No children in [place], Lyle lives in [place], he visits and helps w/ shopping    Lost husband    Outpatient Medications Prior to Visit  Medication Sig Dispense Refill  . Accu-Chek Softclix Lancets lancets USE TO CHECK BLOOD SUGAR TWICE A DAY 200 each 12  . albuterol (VENTOLIN HFA) 108 (90 Base) MCG/ACT inhaler Inhale 2 puffs into the lungs every 4 (four) hours as needed for wheezing  or shortness of breath. 18 g 5  . alum & mag hydroxide-simeth (MAALOX/MYLANTA) 200-200-20 MG/5ML suspension Take 30 mLs by mouth every 4 (four) hours as needed for indigestion. 355 mL 0  . amLODipine (NORVASC) 10 MG tablet Take 1 tablet (10 mg total) by mouth daily. 90 tablet 3  . aspirin EC 81 MG tablet Take 1 tablet (81 mg total) by mouth daily. 90 tablet 3  . Blood Glucose Monitoring Suppl (ACCU-CHEK AVIVA) device Use as instructed to check blood sugar twice daily dx E11.9 1 each 0  . carvedilol (COREG) 25 MG tablet Take 1 tablet (25 mg total) by mouth 2 (two) times daily. 60 tablet 11  . escitalopram (LEXAPRO) 20 MG tablet Take 1 tablet (20 mg total) by mouth daily. 90 tablet 1  . Fluticasone-Salmeterol (ADVAIR) 250-50 MCG/DOSE AEPB Inhale 2 puffs into the lungs daily. (Patient not taking: Reported on 01/26/2019) 60 each 5  . Fluticasone-Salmeterol (WIXELA INHUB) 250-50 MCG/DOSE AEPB Inhale 1 puff into the lungs 2 (two) times daily.    Marland Kitchen glucose blood (ACCU-CHEK AVIVA PLUS) test strip TEST BLOOD SUGAR TWICE DAILY AND LANCETS TWICE DAILY E11.9 200 each 11  . latanoprost (XALATAN) 0.005 % ophthalmic solution Place 1 drop into both eyes at bedtime.    Marland Kitchen levothyroxine (SYNTHROID) 75 MCG tablet Take 1 tablet (75 mcg total) by mouth daily before breakfast. 90 tablet 1  . ondansetron (ZOFRAN) 4 MG tablet Take 1 tablet (4 mg total) by mouth every 6 (six) hours as needed for nausea. 20 tablet 0  . OVER THE COUNTER MEDICATION CPAP: At bedtime    . OXYGEN Inhale 2 L into the lungs continuous.     . pantoprazole (PROTONIX) 40 MG tablet Take 40 mg by mouth as needed.    . potassium chloride SA (K-DUR) 20 MEQ tablet Take 1 tablet (20 mEq total) by mouth 2 (two) times daily. 180 tablet 1  . predniSONE (DELTASONE) 5 MG tablet Take 10 mg by mouth daily with breakfast.     No facility-administered medications prior to visit.    No Known Allergies  ROS Review of Systems  Constitutional: Positive for fatigue  and unexpected weight change.  HENT: Positive for ear pain.   Eyes: Negative for visual disturbance.  Respiratory: Positive for shortness of breath. Negative for cough.        Use inhalers daily Oxygen intermittently  Psychiatric/Behavioral:       Depression     Objective:  Physical Exam  Constitutional: She is oriented to person, place, and time. She appears well-developed and well-nourished.  HENT:  Head: Normocephalic and atraumatic.  Fluid noted left TM  Neck: Normal range of motion. Neck supple.  Pulmonary/Chest: Effort normal and breath sounds normal.  Neurological: She is oriented to person, place, and time.  Psychiatric: She has a normal mood and affect.   Today's Vitals   01/27/19 1325  BP: 128/77  Pulse: 82  Temp: 97.9 F (36.6 C)  TempSrc: Oral  SpO2: 93%  Weight: 150 lb 6.4 oz (68.2 kg)  Height: 5\' 9"  (1.753 m)   Body mass index is 22.21 kg/m. There were no vitals taken for this visit. Wt Readings from Last 3 Encounters:  01/26/19 149 lb (67.6 kg)  01/20/19 146 lb (66.2 kg)  12/24/18 145 lb 1.9 oz (65.8 kg)     Health Maintenance Due  Topic Date Due  . PNA vac Low Risk Adult (2 of 2 - PPSV23) 05/28/2017  . MAMMOGRAM  10/29/2017  . COLONOSCOPY  08/01/2018    Lab Results  Component Value Date   TSH 7.63 (H) 06/04/2018   Lab Results  Component Value Date   WBC 2.9 (L) 12/28/2018   HGB 9.9 (L) 12/28/2018   HCT 29.8 (L) 12/28/2018   MCV 81.6 12/28/2018   PLT 275 12/28/2018   Lab Results  Component Value Date   NA 139 12/28/2018   K 4.0 12/28/2018   CO2 27 12/28/2018   GLUCOSE 74 12/28/2018   BUN 16 12/28/2018   CREATININE 0.83 12/28/2018   BILITOT 0.5 12/28/2018   ALKPHOS 273 (H) 12/11/2018   AST 15 12/28/2018   ALT 6 12/28/2018   PROT 7.2 12/28/2018   ALBUMIN 3.3 (L) 12/11/2018   CALCIUM 9.0 12/28/2018   ANIONGAP 9 12/11/2018   GFR 77.06 06/04/2018   Lab Results  Component Value Date   CHOL 94 11/18/2017   Lab Results   Component Value Date   HDL 30 (L) 11/18/2017   Lab Results  Component Value Date   LDLCALC 42 11/18/2017   Lab Results  Component Value Date   TRIG 110 11/18/2017   Lab Results  Component Value Date   CHOLHDL 3.1 11/18/2017   Lab Results  Component Value Date   HGBA1C 5.5 12/09/2018      Assessment & Plan:  1. Gastroesophageal reflux disease without esophagitis - Ambulatory referral to Gastroenterology  2. Dysphagia, unspecified type - Ambulatory referral to Gastroenterology  3. Nausea and vomiting in adult - Ambulatory referral to Gastroenterology  4. Screening breast examination - MM Digital Screening; Future Follow-up:   Kaliah Haddaway Hannah Beat, MD

## 2019-01-28 ENCOUNTER — Ambulatory Visit: Payer: Medicare Other | Admitting: Gastroenterology

## 2019-02-02 ENCOUNTER — Telehealth: Payer: Self-pay | Admitting: Cardiology

## 2019-02-02 ENCOUNTER — Telehealth: Payer: Self-pay

## 2019-02-02 DIAGNOSIS — I471 Supraventricular tachycardia: Secondary | ICD-10-CM

## 2019-02-02 NOTE — Addendum Note (Signed)
Addended by: Juluis Mire on: 02/02/2019 12:46 PM   Modules accepted: Orders

## 2019-02-02 NOTE — Telephone Encounter (Signed)
The patient has been notified of the result and verbalized understanding.  All questions (if any) were answered. Wilma Flavin, RN 02/02/2019 11:15 AM    Notes recorded by Sueanne Margarita, MD on 02/01/2019 at 4:09 PM EDT  Heart monitor showed NSR with paroxymal atrial fibrillation and wide complex tachycardia likely related to abberation during atach. Given ? Of [facility] On cardiac MRI I would like her referred to our genetic counselor for testing. Please find out if she has any family hx of sudden cardiac death or is she has had syncope. Please find out if she has missed any doses of her carvedilol. If no then I would like her referred to afib clinic for her atrial tach  She has not missed any doses of Carvedilol. Denies family hx of sudden cardiac death.

## 2019-02-02 NOTE — Telephone Encounter (Signed)
Roderic Palau NP reviewed referral to afib clinic - with HOCM and atrial tach would be more appropriate to EP - will place referral to Dr. Lovena Le as requested.

## 2019-02-02 NOTE — Telephone Encounter (Signed)
Follow Up:   Returning [name] call, concerning her monitor results.

## 2019-02-03 ENCOUNTER — Telehealth: Payer: Self-pay

## 2019-02-03 NOTE — Telephone Encounter (Signed)
Notes recorded by Frederik Schmidt, RN on 02/03/2019 at 8:45 AM EDT  [id number] 9/24  ------

## 2019-02-03 NOTE — Telephone Encounter (Signed)
-----   Message from Sueanne Margarita, MD sent at 02/01/2019  4:09 PM EDT ----- Heart monitor showed NSR with paroxymal atrial fibrillation and wide complex tachycardia likely related to abberation during atach.  Given ? Of [facility] On cardiac MRI I would like her referred to our genetic counselor for testing.  Please find out if she has any family hx of sudden cardiac death or is she has had syncope.  Please find out if she has missed any doses of her carvedilol.  If no then I would like her referred to afib clinic for her atrial tach

## 2019-02-07 ENCOUNTER — Telehealth: Payer: Self-pay

## 2019-02-07 NOTE — Telephone Encounter (Signed)
-----   Message from Sueanne Margarita, MD sent at 02/01/2019  4:09 PM EDT ----- Heart monitor showed NSR with paroxymal atrial fibrillation and wide complex tachycardia likely related to abberation during atach.  Given ? Of [facility] On cardiac MRI I would like her referred to our genetic counselor for testing.  Please find out if she has any family hx of sudden cardiac death or is she has had syncope.  Please find out if she has missed any doses of her carvedilol.  If no then I would like her referred to afib clinic for her atrial tach

## 2019-02-07 NOTE — Telephone Encounter (Signed)
Notes recorded by Frederik Schmidt, RN on 02/07/2019 at 3:02 PM EDT  The patient has been notified of the result and verbalized understanding. All questions (if any) were answered.  Frederik Schmidt, RN 02/07/2019 3:02 PM   I spoke to the patient and reviewed her appointment dates (10/8 Dr Broadus John)  (10/20 Dr Lovena Le).  She verbalized understanding.

## 2019-02-09 DIAGNOSIS — I319 Disease of pericardium, unspecified: Secondary | ICD-10-CM | POA: Diagnosis not present

## 2019-02-09 DIAGNOSIS — M25572 Pain in left ankle and joints of left foot: Secondary | ICD-10-CM | POA: Diagnosis not present

## 2019-02-09 DIAGNOSIS — M79644 Pain in right finger(s): Secondary | ICD-10-CM | POA: Diagnosis not present

## 2019-02-09 DIAGNOSIS — M25571 Pain in right ankle and joints of right foot: Secondary | ICD-10-CM | POA: Diagnosis not present

## 2019-02-09 DIAGNOSIS — M79645 Pain in left finger(s): Secondary | ICD-10-CM | POA: Diagnosis not present

## 2019-02-17 ENCOUNTER — Ambulatory Visit: Payer: Medicare Other | Admitting: Genetic Counselor

## 2019-02-17 ENCOUNTER — Other Ambulatory Visit: Payer: Self-pay

## 2019-02-17 DIAGNOSIS — I421 Obstructive hypertrophic cardiomyopathy: Secondary | ICD-10-CM

## 2019-02-20 NOTE — Progress Notes (Signed)
Referring Provider: Maryruth Hancock, MD Primary Care Physician:  Maryruth Hancock, MD Primary GI Physician: Dr. Gala Romney  Chief Complaint  Patient presents with   Nausea    with vomiting. Mostly at night   Gastroesophageal Reflux    burning in throat at night, causes throat to hurt a lot    HPI:   [name] is a 68 y.o. female presenting today for follow-up.  Patient was last seen in our office on 12/16/2018 for hospital follow-up.  She had been admitted the week prior with abdominal pain, vomiting, abnormal LFTs.  Right upper quadrant ultrasound with cholelithiasis and findings concerning for acute cholecystitis.  Liver with increased echogenicity and contour mildly lobar.  HIDA with continued accumulation of radiotracer after CCK administration could be due to biliary dyskinesia, opioid use or chronic cholecystitis.  During hospitalization labs showed chronic leukopenia, mild anemia with hemoglobin 10.4, lipase normal, and improving LFTs but had not normalized.   At last visit on 12/16/18, she reported recurrence of postprandial vomiting, mid abdominal pain.  Massive weight loss of 166 pounds since March 2019.  Plans to repeat labs, follow-up with general surgery.  Recommended if liver grossly abnormal at time of gallbladder surgery, obtain liver biopsy.  Repeat labs on 12/28/2018 with LFTs returned to normal limits, electrolytes and kidney function within normal limits, still with low WBC and low hemoglobin which are chronic findings.  No true iron deficiency with ferritin normal although iron, TIBC, and percent saturation are all low.  B12 normal. TSH normal. Lipase normal.   Follow-up with Dr. Constance Haw on 01/20/2019.  Reports she received information from patient's rheumatologist reporting patient has been diagnosed with lupus and possibly arthritis.  Currently treated with prednisone and will likely move to methotrexate.  Plans to hold off on cholecystectomy as patients symptoms of  regurgitation of food and heartburn/reflux in the middle of the night were felt to be more related to esophageal motility rather than her gallbladder.  Patient denied right abdominal pain or issues with eating greasy or fatty foods.  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   Result note from Neil Crouch, PA with instructions to increase Protonix to BID as patient reported nocturnal heartburn.   Holter monitor with NSR with paroxysmal atrial fibrillation and wide-complex tachycardia likely related to aspiration during attachment. She was referred to genetic counseling for HOCM and EP.     She does have a history of H. 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 No verification of H. Pylor eradication.   Today she appears distracted during visit and difficult to get a good history from. She is frustrated about seeing so many doctors and seems overall overwhelmed by her current health. She states she is having a lot of reflux. All throughout the day and worse at night. Causes her throat to hurt. Will vomit after meals. Mostly at night. Sometimes after dinner. Often in the middle of the night or early morning hours. Breakfast will stay down most of the time. Boiled eggs, sausage, bacon. Sausage may bother her times. Intermittent nausea. Vomiting daily. Occasional dysphagia. Food hanging in upper esophagus 2-3 days a week. More with meats. Has to drink water behind it. Burning in upper abdomen at times. No specific triggers. Burning in chest when she lays down. States Protonix makes her sick. Taking only once  a day if she takes it. Taking tums which help some. Zofran maybe twice a day. Drinks gengerale. This helps some. Appetite is ok. She is eating at least 2 meals a day with a snack or sandwich for lunch.   No blood in the stools or  black stools. BMs daily. No diarrhea. No constipation. No lower abdominal pain. No hematemesis. No fever. Reports she is cold at times. Some dizziness when she stands up. This is not new. Legs are weak.  Denies confusion, yellowing of the eyes or skin, easy bruising, or swelling in her abdomen or lower extremities.  No NSAIDs. No alcohol. No drug use.   Weight: 137 lbs 12/16/18, 150 lbs 01/27/19, 146 lbs 02/21/19.    Request to see Dr. Gala Romney in follow up since she has not seen him yet.     Past Medical History:  Diagnosis Date   Anterolisthesis    Grade 1, L4-5   Bronchospasm 05/28/2012   COPD (chronic obstructive pulmonary disease) ([place])    DEGENERATIVE JOINT DISEASE 10/06/2006   DEPRESSION 09/26/2008   DIABETES MELLITUS 10/06/2006   Diverticulosis    [id number]   GERD (gastroesophageal reflux disease) 07/25/2013   HOCM (hypertrophic obstructive cardiomyopathy) ([place])    HYPERLIPIDEMIA 01/11/2009   HYPERTENSION 10/06/2006   Hypokalemia    Hypomagnesemia    INSOMNIA 09/26/2008   Internal hemorrhoids    OBSTRUCTIVE SLEEP APNEA 06/23/2008   Severe OSA per sleep study 2010, Rx a CPAP   Pain in joint, multiple sites 11/10/2006   Pericardial effusion    Pericarditis    UNSPECIFIED ANEMIA 12/10/2009   UTI (urinary tract infection) 11/2017    Past Surgical History:  Procedure Laterality Date   BIOPSY  12/28/2017   Procedure: BIOPSY;  Surgeon: Irving Copas., MD;  Location: [facility];  Service: Gastroenterology;;   COLONOSCOPY  08/01/2011   Procedure: COLONOSCOPY;  Surgeon: Inda Castle, MD;  Location: [facility];  Service: Endoscopy;  Laterality: N/A;. hyperplastic polyp removed, hemorrhoids s/p banding, diverticulosis, next tcs 10 years.    ESOPHAGOGASTRODUODENOSCOPY (EGD) WITH PROPOFOL N/A 12/28/2017   Procedure: ESOPHAGOGASTRODUODENOSCOPY (EGD) WITH PROPOFOL;  Surgeon: Rush Landmark Telford Nab., MD;  Location: [facility];  Service: Gastroenterology;   Laterality: N/A; h.pylori gastiris, tortuous esophagus with esophageal bx (no EOE), medium sized hiatal hernia   LEFT HEART CATH AND CORONARY ANGIOGRAPHY N/A 10/22/2017   Procedure: LEFT HEART CATH AND CORONARY ANGIOGRAPHY;  Surgeon: Jettie Booze, MD;  Location: [facility];  Service: Cardiovascular;  Laterality: N/A;   Left knee replacement  07/2007   Right knee replacement  2005    Current Outpatient Medications  Medication Sig Dispense Refill   Accu-Chek Softclix Lancets lancets USE TO CHECK BLOOD SUGAR TWICE A DAY 200 each 12   albuterol (VENTOLIN HFA) 108 (90 Base) MCG/ACT inhaler Inhale 2 puffs into the lungs every 4 (four) hours as needed for wheezing or shortness of breath. 18 g 5   alum & mag hydroxide-simeth (MAALOX/MYLANTA) 200-200-20 MG/5ML suspension Take 30 mLs by mouth every 4 (four) hours as needed for indigestion. 355 mL 0   amLODipine (NORVASC) 10 MG tablet Take 1 tablet (10 mg total) by mouth daily. 90 tablet 3   aspirin EC 81 MG tablet Take 1 tablet (81 mg total) by mouth daily. 90 tablet 3   carvedilol (COREG) 25 MG tablet Take 1 tablet (25 mg total) by mouth 2 (two) times daily. 60 tablet 11   escitalopram (LEXAPRO) 20 MG tablet Take 1  tablet (20 mg total) by mouth daily. 90 tablet 1   Fluticasone-Salmeterol (WIXELA INHUB) 250-50 MCG/DOSE AEPB Inhale 1 puff into the lungs 2 (two) times daily.     glucose blood (ACCU-CHEK AVIVA PLUS) test strip TEST BLOOD SUGAR TWICE DAILY AND LANCETS TWICE DAILY E11.9 200 each 11   latanoprost (XALATAN) 0.005 % ophthalmic solution Place 1 drop into both eyes at bedtime.     levothyroxine (SYNTHROID) 75 MCG tablet Take 1 tablet (75 mcg total) by mouth daily before breakfast. 90 tablet 1   ondansetron (ZOFRAN) 4 MG tablet Take 1 tablet (4 mg total) by mouth every 6 (six) hours as needed for nausea. 20 tablet 0   OVER THE COUNTER MEDICATION CPAP: At bedtime     OXYGEN Inhale 2 L into the lungs continuous.       potassium chloride SA (K-DUR) 20 MEQ tablet Take 1 tablet (20 mEq total) by mouth 2 (two) times daily. 180 tablet 1   omeprazole (PRILOSEC) 40 MG capsule Take 1 capsule (40 mg total) by mouth 2 (two) times daily before lunch and supper. 60 capsule 5   No current facility-administered medications for this visit.     Allergies as of 02/21/2019   (No Known Allergies)    Family History  Problem Relation Age of Onset   Asthma Mother    Stroke Mother    Diabetes Mother    Diabetes Other        M, B, S   Hypertension Sister        M, S,B   Pancreatic cancer Brother    Colon cancer Neg Hx    Prostate cancer Neg Hx    Breast cancer Neg Hx    Esophageal cancer Neg Hx    Liver disease Neg Hx    Rectal cancer Neg Hx    Stomach cancer Neg Hx    Inflammatory bowel disease Neg Hx     Social History   Socioeconomic History   Marital status: Widowed    Spouse name: Not on file   Number of children: 4   Years of education: Not on file   Highest education level: Not on file  Occupational History   Occupation: disability    Employer: UNEMPLOYED  Social Designer, fashion/clothing strain: Not on file   Food insecurity    Worry: Not on file    Inability: Not on file   Transportation needs    Medical: Not on file    Non-medical: Not on file  Tobacco Use   Smoking status: Former Smoker    Packs/day: 0.20    Years: 4.00    Pack years: 0.80    Types: Cigarettes    Quit date: 05/13/1995    Years since quitting: 23.8   Smokeless tobacco: Never Used  Substance and Sexual Activity   Alcohol use: Not Currently   Drug use: No   Sexual activity: Not Currently  Lifestyle   Physical activity    Days per week: Not on file    Minutes per session: Not on file   Stress: Not on file  Relationships   Social connections    Talks on phone: Not on file    Gets together: Not on file    Attends religious service: Not on file    Active member of club or  organization: Not on file    Attends meetings of clubs or organizations: Not on file    Relationship status: Not on file  Other Topics Concern   Not on file  Social History Narrative   Widow , lives by herself   Lost a son, 3 living    No children in [place], [place] lives in [place], he visits and helps w/ shopping    Lost husband    Review of Systems: Gen: See HPI CV: Denies chest pain, palpitations.  Resp: Chronic shortness of breath. Not worsening. No regular cough.  GI: See HPI Derm: Denies rash Heme: See HPI  Physical Exam: BP 116/78    Pulse 85    Temp (!) 97 F (36.1 C) (Oral)    Ht 5\' 9"  (1.753 m)    Wt 146 lb 12.8 oz (66.6 kg)    BMI 21.68 kg/m  General:   Alert and oriented. No distress noted. Seems distracted at times. Using a walker.  Head:  Normocephalic and atraumatic. Eyes:  Conjuctiva clear without scleral icterus. Heart:  S1, S2 present without murmurs appreciated. Lungs:  Clear to auscultation bilaterally. No wheezes, rales, or rhonchi. No distress.  Abdomen:  +BS, soft, non-distended.  Mild tenderness to palpation in the epigastric area. No rebound or guarding. No HSM or masses noted. Msk:  Symmetrical without gross deformities. Normal posture. Extremities:  Without edema.  Neurologic:  Alert and  oriented x4 Psych: Normal mood and affect.

## 2019-02-21 ENCOUNTER — Other Ambulatory Visit: Payer: Self-pay

## 2019-02-21 ENCOUNTER — Encounter: Payer: Self-pay | Admitting: *Deleted

## 2019-02-21 ENCOUNTER — Encounter: Payer: Self-pay | Admitting: Gastroenterology

## 2019-02-21 ENCOUNTER — Encounter: Payer: Self-pay | Admitting: Internal Medicine

## 2019-02-21 ENCOUNTER — Ambulatory Visit (INDEPENDENT_AMBULATORY_CARE_PROVIDER_SITE_OTHER): Payer: Medicare Other | Admitting: Gastroenterology

## 2019-02-21 VITALS — BP 116/78 | HR 85 | Temp 97.0°F | Ht 69.0 in | Wt 146.8 lb

## 2019-02-21 DIAGNOSIS — R1310 Dysphagia, unspecified: Secondary | ICD-10-CM

## 2019-02-21 DIAGNOSIS — R945 Abnormal results of liver function studies: Secondary | ICD-10-CM | POA: Diagnosis not present

## 2019-02-21 DIAGNOSIS — K219 Gastro-esophageal reflux disease without esophagitis: Secondary | ICD-10-CM

## 2019-02-21 DIAGNOSIS — R634 Abnormal weight loss: Secondary | ICD-10-CM

## 2019-02-21 DIAGNOSIS — R1010 Upper abdominal pain, unspecified: Secondary | ICD-10-CM

## 2019-02-21 DIAGNOSIS — R112 Nausea with vomiting, unspecified: Secondary | ICD-10-CM | POA: Diagnosis not present

## 2019-02-21 DIAGNOSIS — R7989 Other specified abnormal findings of blood chemistry: Secondary | ICD-10-CM

## 2019-02-21 MED ORDER — OMEPRAZOLE 40 MG PO CPDR: 40.0000 mg | DELAYED_RELEASE_CAPSULE | Freq: Two times a day (BID) | ORAL | 5 refills | Status: DC

## 2019-02-21 NOTE — Patient Instructions (Addendum)
I am placing the order for a barium pill esophagram. This is essentially a live x-ray that watches how contrast media and a pill moves through your esophagus.   Stop Protonix and start Omeprazole 40 mg twice daily. You should take this 30 minutes before breakfast and 30 minutes before dinner.   Please start taking Zofran before and or with meals to prevent nausea.   Follow GERD diet. Avoid spicy, fatty, greasy, fried foods. Avoid caffeine and carbonated beverages. See handout below.   You should eat 4-6 small meals daily. Do not lay down within 3 hours of eating. Prop head of bed up on bricks or wood to create 6 inch incline.   Follow-up with Dr. Dudley Major in 2 months. Please call in 2 weeks to let me know how omeprazole is working.   Melissa Altes, PA-C [facility]   Food Choices for Gastroesophageal Reflux Disease, Adult When you have gastroesophageal reflux disease (GERD), the foods you eat and your eating habits are very important. Choosing the right foods can help ease the discomfort of GERD. Consider working with a diet and nutrition specialist (dietitian) to help you make healthy food choices. What general guidelines should I follow?  Eating plan  Choose healthy foods low in fat, such as fruits, vegetables, whole grains, low-fat dairy products, and lean meat, fish, and poultry.  Eat frequent, small meals instead of three large meals each day. Eat your meals slowly, in a relaxed setting. Avoid bending over or lying down until 2-3 hours after eating.  Limit high-fat foods such as fatty meats or fried foods.  Limit your intake of oils, butter, and shortening to less than 8 teaspoons each day.  Avoid the following: ? Foods that cause symptoms. These may be different for different people. Keep a food diary to keep track of foods that cause symptoms. ? Alcohol. ? Drinking large amounts of liquid with meals. ? Eating meals during the 2-3 hours before bed.  Cook foods  using methods other than frying. This may include baking, grilling, or broiling. Lifestyle  Maintain a healthy weight. Ask your health care provider what weight is healthy for you. If you need to lose weight, work with your health care provider to do so safely.  Exercise for at least 30 minutes on 5 or more days each week, or as told by your health care provider.  Avoid wearing clothes that fit tightly around your waist and chest.  Do not use any products that contain nicotine or tobacco, such as cigarettes and e-cigarettes. If you need help quitting, ask your health care provider.  Sleep with the head of your bed raised. Use a wedge under the mattress or blocks under the bed frame to raise the head of the bed. What foods are not recommended? The items listed may not be a complete list. Talk with your dietitian about what dietary choices are best for you. Grains Pastries or quick breads with added fat. [place] toast. Vegetables Deep fried vegetables. [place] fries. Any vegetables prepared with added fat. Any vegetables that cause symptoms. For some people this may include tomatoes and tomato products, chili peppers, onions and garlic, and horseradish. Fruits Any fruits prepared with added fat. Any fruits that cause symptoms. For some people this may include citrus fruits, such as oranges, grapefruit, pineapple, and lemons. Meats and other protein foods High-fat meats, such as fatty beef or pork, hot dogs, ribs, ham, sausage, salami and bacon. Fried meat or protein, including fried fish and fried  chicken. Nuts and nut butters. Dairy Whole milk and chocolate milk. Sour cream. Cream. Ice cream. Cream cheese. Milk shakes. Beverages Coffee and tea, with or without caffeine. Carbonated beverages. Sodas. Energy drinks. Fruit juice made with acidic fruits (such as orange or grapefruit). Tomato juice. Alcoholic drinks. Fats and oils Butter. Margarine. Shortening. Ghee. Sweets and desserts Chocolate  and cocoa. Donuts. Seasoning and other foods Pepper. Peppermint and spearmint. Any condiments, herbs, or seasonings that cause symptoms. For some people, this may include curry, hot sauce, or vinegar-based salad dressings. Summary  When you have gastroesophageal reflux disease (GERD), food and lifestyle choices are very important to help ease the discomfort of GERD.  Eat frequent, small meals instead of three large meals each day. Eat your meals slowly, in a relaxed setting. Avoid bending over or lying down until 2-3 hours after eating.  Limit high-fat foods such as fatty meat or fried foods. This information is not intended to replace advice given to you by your health care provider. Make sure you discuss any questions you have with your health care provider. Document Released: 04/28/2005 Document Revised: 08/19/2018 Document Reviewed: 04/29/2016 Elsevier Patient Education  2020 [employer].

## 2019-02-22 ENCOUNTER — Ambulatory Visit (HOSPITAL_COMMUNITY)
Admission: RE | Admit: 2019-02-22 | Discharge: 2019-02-22 | Disposition: A | Discharge status: Home / Self Care | Payer: Medicare Other | Source: Ambulatory Visit | Attending: "Endocrinology | Admitting: "Endocrinology

## 2019-02-22 ENCOUNTER — Other Ambulatory Visit (HOSPITAL_COMMUNITY)
Admission: RE | Admit: 2019-02-22 | Discharge: 2019-02-22 | Disposition: A | Discharge status: Home / Self Care | Payer: Medicare Other | Source: Ambulatory Visit | Attending: "Endocrinology | Admitting: "Endocrinology

## 2019-02-22 DIAGNOSIS — E042 Nontoxic multinodular goiter: Secondary | ICD-10-CM | POA: Insufficient documentation

## 2019-02-22 LAB — TSH: TSH: 5.421 u[IU]/mL — ABNORMAL HIGH (ref 0.350–4.500)

## 2019-02-22 LAB — CORTISOL-AM, BLOOD: Cortisol - AM: 7 ug/dL (ref 6.7–22.6)

## 2019-02-22 LAB — T4, FREE: Free T4: 0.93 ng/dL (ref 0.61–1.12)

## 2019-02-23 ENCOUNTER — Ambulatory Visit (HOSPITAL_COMMUNITY): Payer: Medicare Other

## 2019-02-23 LAB — THYROGLOBULIN ANTIBODY: Thyroglobulin Antibody: <1 IU/mL (ref 0.0–0.9)

## 2019-02-23 LAB — THYROID PEROXIDASE ANTIBODY: Thyroperoxidase Ab SerPl-aCnc: 104 IU/mL — ABNORMAL HIGH (ref 0–34)

## 2019-02-23 NOTE — Assessment & Plan Note (Addendum)
Patient had massive weight loss of greater than 150 pounds in the last 2 years.  She has had chronic GI issues with uncontrolled reflux, upper abdominal pain, postprandial nausea and vomiting, and dysphagia. Recently her weight has been stable/improving. Overall up compared to last visit, but down from September. Weight: 137 lbs 12/16/18, 150 lbs 01/27/19, 146 lbs 02/21/19.  Labs on 12/28/2018 with electrolytes and kidney function within normal limits. WBC low 2.9 and Hgb low 9.9 with normocytic indices which are chronic findings. Anemia present since March 2019. No true iron deficiency with ferritin normal although iron, TIBC, and percent saturation are all low.  B12 normal. TSH normal. It was recommended to pursue TCS due to anemia and weight loss; however, patient currently undergoing cardiac evaluation for HOCM and atrial fibrillation. Continue to follow this along. I suspect underlying GI issues are playing a role.  These are being addressed as per above.  Follow-up in 2 months.

## 2019-02-23 NOTE — Assessment & Plan Note (Addendum)
Symptoms not adequately controlled. Not taking Protonix as prescribed. She feels Protonix makes her sick. Also with epigastric pain, dysphagia, and nausea with vomiting as addressed below.   Will change Protonix 40 mg BID to Omeprazole 40 mg BID. Hopefully this will help with nausea/vomiting as well. Discussed GERD diet and lifestyle as well. Advised to avoid spicy, fatty, greasy, fried foods. Avoid caffeine and carbonated beverages. Eat 4-6 small meals daily. Do not lay down within 3 hours of eating. Prop head of bed up on bricks or wood to create 6 inch incline. GERD handout provided.  Follow-up in 2 months.

## 2019-02-23 NOTE — Assessment & Plan Note (Addendum)
Dysphagia with sensation of food hanging in upper esophagus 2-3 days a week.  Usually occurring with meats.  Also with uncontrolled GERD, nausea with vomiting, and epigastric pain as described below.  She has had massive weight loss over the last 2 years of over 150 pounds; however, her weight has remained stable/improved since last visit in August 2020.  No other alarm symptoms.  Differentials include esophageal stricture, web, ring, or malignancy. Suspect uncontrolled GERD is playing a role.  Patient needs EGD +/- dilation however this will have to be scheduled after her cardiac evaluation for new diagnosis of HOCM and atrial fibrillation. Change Protonix 40 mg BID to Omeprazole 40 mg BID. Hopefully this will help with nausea/vomiting as well. Discussed GERD diet and lifestyle as well. Advised to avoid spicy, fatty, greasy, fried foods. Avoid caffeine and carbonated beverages. Eat 4-6 small meals daily. Do not lay down within 3 hours of eating. Prop head of bed up on bricks or wood to create 6 inch incline. GERD handout provided.  Follow-up in 2 months.

## 2019-02-23 NOTE — Assessment & Plan Note (Addendum)
Suspect acute elevation of LFTs while hospitalized 7/30-12/11/18 was likely due to passage of CBD stone as LFTs were back within normal limits on repeat labs on 12/28/18. RUQ [place] during admission with indings concerning for acute cholecystitis.  Liver with increased echogenicity representing a degree of steatosis and contour mildly lobar that could represent a degree of underlying cirrhosis. There were plans for liver biopsy at the time of gallbladder surgery; however, this has been put on hold as surgery has felt current GI symptoms were not consistent with gallbladder etiology. Additionally, patient is undergoing further cardiac evaluation at this time for HOCM and atrial fibrillation. She is without any signs or symptoms of advanced liver disease. She needs an EGD for dysphagia and nausea with vomiting as described above which would also evaluate for any signs of varices; however, we can not pursue this until cardiac evaluation is complete. Continue to monitor.   Follow-up in 2 months.

## 2019-02-23 NOTE — Assessment & Plan Note (Addendum)
Patient continues to have postprandial nausea and vomiting.  Additional upper GI symptoms include uncontrolled GERD, dysphagia, and dyspepsia. She has also had massive weight loss over the last 2 years of over 150 lbs. Weight has been stable/improved since last visit in August 2020. No other alarm features.  Originally it was felt postprandial nausea/vomiting were related to chronic cholecystitis with calculus with referral to surgery placed for cholecystectomy; however, patient saw Dr. Constance Haw with plans to hold off on cholecystectomy as she felt patient symptoms of food regurgitation and heartburn/reflux were felt to be less consistent with gallbladder etiology. Patient has not been taking her Protonix as prescribed due to feeling it makes her sick. Suspect patients nausea/vomiting may be related to uncontrolled GERD. She may also have a degree of gastroparesis with history of diabetes. Suspect possible esophageal stricture, ring, or web causing dysphagia. Can't rule out malignancy with significant weight loss. Recent labs in August 2020 with stable chronic normocytic anemia.   Change Protonix 40 mg BID to Omeprazole 40 mg BID.  Discussed GERD diet and lifestyle as well. Advised to avoid spicy, fatty, greasy, fried foods. Avoid caffeine and carbonated beverages. Eat 4-6 small meals daily. Do not lay down within 3 hours of eating. Prop head of bed up on bricks or wood to create 6 inch incline. GERD handout provided.  Zofran with meals rather than as needed.  Patient needs EGD +/- dilation; however, this will need to wait until cardiac evaluation is complete for HOCM and atrial fibrillation.  Pursue BPE for dysphagia at this time.  Follow-up in 2 months.

## 2019-02-23 NOTE — Assessment & Plan Note (Addendum)
Patient reporting epigastric burning, uncontrolled GERD with burning in her chest, nausea with vomiting, and dysphagia. Not taking Protonix as prescribed due to feeling it makes her sick. Also with massive weight loss over the last 2 years of greater than 150 lbs. Since last visit, weight has been stable/improved. No other alarm symptoms. No NSAIDs. Labs in August 2020 with LFTs within normal limits, electrolytes and kidney function within normal limits, stable normocytic anemia present since March 2020.  No true iron deficiency with ferritin normal although iron, TIBC, and percent saturation are all low.  B12 normal. TSH normal. Lipase normal. She does have history of H. 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 She has not had verification of H. Pylori eradication.   Differentials include gastritis, esophagitis, or duodenitis, vs PUD. She could have ongoing h. Pylori, but patient would not tolerate discontinuing PPI for two weeks for testing at this point. Suspect uncontrolled GERD may also be contributing to ongoing nausea with vomiting vs pyloric stenosis vs possible underlying gastroparesis with history of diabetes vs malignancy. Esophageal stricture vs ring vs web vs malignancy causing dysphagia.    Will change Protonix 40 mg BID to Omeprazole 40 mg BID. Hopefully this will help with nausea/vomiting as well. Discussed GERD diet and lifestyle as well. Advised to avoid spicy, fatty, greasy, fried foods. Avoid caffeine and carbonated beverages. Eat 4-6 small meals daily. Do not lay down within 3 hours of eating. Prop head of bed up on bricks or wood to create 6 inch incline. GERD handout provided.  Needs EGD +/- dilation for further evaluation of her symptoms as well as to confirm H. Pylori eradication but this will have to be after cardiac evaluation for HOCM and atrial fibrillation.  BPE  at this time.  Follow-up in 2 months.

## 2019-02-24 ENCOUNTER — Telehealth: Payer: Self-pay | Admitting: "Endocrinology

## 2019-02-24 NOTE — Telephone Encounter (Signed)
FNA order sent to central scheduling for appt.

## 2019-02-24 NOTE — Telephone Encounter (Signed)
Please see pts thyroid u/s. Does pt still need the thyroid biopsy

## 2019-02-24 NOTE — Telephone Encounter (Signed)
Yes, the 2.9 cm right lobe nodule will need FNA.

## 2019-02-24 NOTE — Progress Notes (Signed)
cc'ed to pcp °

## 2019-02-28 NOTE — Progress Notes (Signed)
Referring Provider: Fransico Him, MD  Referral Reason  [name] was referred for genetic consult and testing of HCM subsequent to recent cardiac imaging that indicated cardiac hypertrophy  Genetic Consultation Notes  [name] was counseled on the genetics of hypertrophic cardiomyopathy (HCM), namely its autosomal dominant inheritance. We also talked about incomplete penetrance, variable expression and digenic/compound mutations that can be seen in some patients with HCM. We briefly discussed the inheritance pattern and treatment plans for the infiltrative cardiomyopathies that present as HCM phenocopies.   Her medical and 3-generation family history was obtained. See details below-  [name] (II.3 on pedigree) is a 68 year-old [place] American woman who uses a walker and presents with some difficulty in walking to the office. [name] states that her symptoms of chest pains, shortness of breath and episodic dizziness began about 4-5 years ago. She says that her legs give out a lot if she stands for too long. She was recently told that she may have a genetic heart condition, based on her echocardiogram and cardiac MRI studies. She was found to have moderate concentric hypertrophy with an EF of 66% and patchy LGE.   Traditional Risk Factors [name] states that she was diagnosed with hypertension in the 1970s and states that it is mostly controlled by medication. Interestingly, her echocardiogram revealed mild LVH with mild aortic valve calcification. Aortic valve calcification has been associated with left ventricular hypertrophy.  Family history [name] (II.3) is one of 6 siblings. She has two sisters, ages 76 (II.1) and 8 (II.4) and three brothers- none are alive (II.2, II.5, II.6). She seems confused about their current health status and cannot provide any specific cardiac-related details about her family.   [name] has 5 children, all boys (III.1-III.5).  One son was an alcoholic and died at age 62 (III.1), and another son died of a congenital heart defect at 3 days. Her three boys that are living seem to be in good health and are ages 77 (III.2), 55 (III.4) and 34 (III.5).  [name]'s parents (I.1, I.2) died at age 20 and 63 and she does not know much about their medical history.  Impression  In summary, [name] presents with moderate concentric hypertrophy. Her presentation is likely confounded by her hypertension and aortic valve calcification. Additionally, very little information could be obtained today from [name] regarding her family history. Thus, considering her late age of presentation in the background of risk factors of HTN and aortic valve calcification as well as no significant family history of HCM or sudden death, it is unlikely that she has a genetic condition.   I informed her that we can pursue genetic testing later, if a first-degree relative presents with symptoms or is diagnosed with HCM. She verbalized understanding of this.  Please note that the patient has not been counseled in this visit on personal, cultural or ethical issues that she may face due to her heart condition.     Lattie Corns, Ph.D, [facility] Clinical Molecular Geneticist

## 2019-03-01 ENCOUNTER — Encounter (INDEPENDENT_AMBULATORY_CARE_PROVIDER_SITE_OTHER): Payer: Self-pay

## 2019-03-01 ENCOUNTER — Encounter: Payer: Self-pay | Admitting: Internal Medicine

## 2019-03-01 ENCOUNTER — Other Ambulatory Visit: Payer: Self-pay

## 2019-03-01 ENCOUNTER — Ambulatory Visit (INDEPENDENT_AMBULATORY_CARE_PROVIDER_SITE_OTHER): Payer: Medicare Other | Admitting: Internal Medicine

## 2019-03-01 DIAGNOSIS — I480 Paroxysmal atrial fibrillation: Secondary | ICD-10-CM | POA: Diagnosis not present

## 2019-03-01 NOTE — Progress Notes (Signed)
HPI Ms. Blacketer is referred by Melina Copa PA-C for consideration for ICD insertion and for recommendations on treatment options. She is a pleasant 68 yo woman with HCM. She has no obstruction. She has not had syncope and there is no family h/o sudden death. She has over 150 lb weight loss likely related to collagen vascular disease. She has scleroderma. She had cardiac MRI which demonstrated patchy LGE with normal LV function. She has class 2 CHF symptoms.  No Known Allergies   Current Outpatient Medications  Medication Sig Dispense Refill  . Accu-Chek Softclix Lancets lancets USE TO CHECK BLOOD SUGAR TWICE A DAY 200 each 12  . albuterol (VENTOLIN HFA) 108 (90 Base) MCG/ACT inhaler Inhale 2 puffs into the lungs every 4 (four) hours as needed for wheezing or shortness of breath. 18 g 5  . alum & mag hydroxide-simeth (MAALOX/MYLANTA) 200-200-20 MG/5ML suspension Take 30 mLs by mouth every 4 (four) hours as needed for indigestion. 355 mL 0  . amLODipine (NORVASC) 10 MG tablet Take 1 tablet (10 mg total) by mouth daily. 90 tablet 3  . ARIPiprazole (ABILIFY) 5 MG tablet Take 1 tablet by mouth daily.    Marland Kitchen aspirin EC 81 MG tablet Take 1 tablet (81 mg total) by mouth daily. 90 tablet 3  . carvedilol (COREG) 25 MG tablet Take 1 tablet (25 mg total) by mouth 2 (two) times daily. 60 tablet 11  . escitalopram (LEXAPRO) 20 MG tablet Take 1 tablet (20 mg total) by mouth daily. 90 tablet 1  . Fluticasone-Salmeterol (WIXELA INHUB) 250-50 MCG/DOSE AEPB Inhale 1 puff into the lungs 2 (two) times daily.    Marland Kitchen glucose blood (ACCU-CHEK AVIVA PLUS) test strip TEST BLOOD SUGAR TWICE DAILY AND LANCETS TWICE DAILY E11.9 200 each 11  . latanoprost (XALATAN) 0.005 % ophthalmic solution Place 1 drop into both eyes at bedtime.    Marland Kitchen levothyroxine (SYNTHROID) 75 MCG tablet Take 1 tablet (75 mcg total) by mouth daily before breakfast. 90 tablet 1  . omeprazole (PRILOSEC) 40 MG capsule Take 1 capsule (40 mg total) by  mouth 2 (two) times daily before lunch and supper. 60 capsule 5  . ondansetron (ZOFRAN) 4 MG tablet Take 1 tablet (4 mg total) by mouth every 6 (six) hours as needed for nausea. 20 tablet 0  . OVER THE COUNTER MEDICATION CPAP: At bedtime    . OXYGEN Inhale 2 L into the lungs continuous.     . potassium chloride SA (K-DUR) 20 MEQ tablet Take 1 tablet (20 mEq total) by mouth 2 (two) times daily. 180 tablet 1  . rosuvastatin (CRESTOR) 40 MG tablet Take 1 tablet by mouth daily.     No current facility-administered medications for this visit.      Past Medical History:  Diagnosis Date  . Anterolisthesis    Grade 1, L4-5  . Bronchospasm 05/28/2012  . COPD (chronic obstructive pulmonary disease) ([place])   . DEGENERATIVE JOINT DISEASE 10/06/2006  . DEPRESSION 09/26/2008  . DIABETES MELLITUS 10/06/2006  . Diverticulosis    [id number]  . GERD (gastroesophageal reflux disease) 07/25/2013  . HOCM (hypertrophic obstructive cardiomyopathy) ([place])   . HYPERLIPIDEMIA 01/11/2009  . HYPERTENSION 10/06/2006  . Hypokalemia   . Hypomagnesemia   . INSOMNIA 09/26/2008  . Internal hemorrhoids   . OBSTRUCTIVE SLEEP APNEA 06/23/2008   Severe OSA per sleep study 2010, Rx a CPAP  . Pain in joint, multiple sites 11/10/2006  . Pericardial effusion   . Pericarditis   .  UNSPECIFIED ANEMIA 12/10/2009  . UTI (urinary tract infection) 11/2017    ROS:   All systems reviewed and negative except as noted in the HPI.   Past Surgical History:  Procedure Laterality Date  . BIOPSY  12/28/2017   Procedure: BIOPSY;  Surgeon: Irving Copas., MD;  Location: [place];  Service: Gastroenterology;;  . COLONOSCOPY  08/01/2011   Procedure: COLONOSCOPY;  Surgeon: Inda Castle, MD;  Location: [facility];  Service: Endoscopy;  Laterality: N/A;. hyperplastic polyp removed, hemorrhoids s/p banding, diverticulosis, next tcs 10 years.   . ESOPHAGOGASTRODUODENOSCOPY (EGD) WITH PROPOFOL N/A 12/28/2017   Procedure:  ESOPHAGOGASTRODUODENOSCOPY (EGD) WITH PROPOFOL;  Surgeon: Rush Landmark Telford Nab., MD;  Location: [facility];  Service: Gastroenterology;  Laterality: N/A; h.pylori gastiris, tortuous esophagus with esophageal bx (no EOE), medium sized hiatal hernia  . LEFT HEART CATH AND CORONARY ANGIOGRAPHY N/A 10/22/2017   Procedure: LEFT HEART CATH AND CORONARY ANGIOGRAPHY;  Surgeon: Jettie Booze, MD;  Location: [facility];  Service: Cardiovascular;  Laterality: N/A;  . 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 Right knee replacement  2005     Family History  Problem Relation Age of Onset  . Asthma Mother   . Stroke Mother   . Diabetes Mother   . Diabetes Other        M, B, S  . Hypertension Sister        M, S,B  . Pancreatic cancer Brother   . Colon cancer Neg Hx   . Prostate cancer Neg Hx   . Breast cancer Neg Hx   . Esophageal cancer Neg Hx   . Liver disease Neg Hx   . Rectal cancer Neg Hx   . Stomach cancer Neg Hx   . Inflammatory bowel disease Neg Hx      Social History   Socioeconomic History  . Marital status: Widowed    Spouse name: Not on file  . Number of children: 4  . Years of education: Not on file  . Highest education level: Not on file  Occupational History  . Occupation: disability    Employer: UNEMPLOYED  Social Needs  . Financial resource strain: Not on file  . Food insecurity    Worry: Not on file    Inability: Not on file  . Transportation needs    Medical: Not on file    Non-medical: Not on file  Tobacco Use  . Smoking status: Former Smoker    Packs/day: 0.20    Years: 4.00    Pack years: 0.80    Types: Cigarettes    Quit date: 05/13/1995    Years since quitting: 23.8  . Smokeless tobacco: Never Used  Substance and Sexual Activity  . Alcohol use: Not Currently  . Drug use: No  . Sexual activity: Not Currently  Lifestyle  . Physical activity    Days per week: Not on file    Minutes per session: Not on file  . Stress: Not on file   Relationships  . Social Herbalist on phone: Not on file    Gets together: Not on file    Attends religious service: Not on file    Active member of club or organization: Not on file    Attends meetings of clubs or organizations: Not on file    Relationship status: Not on file  . Intimate partner violence    Fear of current or ex partner: Not on file    Emotionally abused:  Not on file    Physically abused: Not on file    Forced sexual activity: Not on file  Other Topics Concern  . Not on file  Social History Narrative   Widow , lives by herself   Lost a son, 3 living    No children in [place], [place] lives in [place], he visits and helps w/ shopping    Lost husband     BP 124/78   Pulse 82   Ht 5\' 9"  (1.753 m)   Wt 150 lb (68 kg)   SpO2 97%   BMI 22.15 kg/m   Physical Exam:  Well appearing NAD HEENT: Unremarkable Neck:  No JVD, no thyromegally Lymphatics:  No adenopathy Back:  No CVA tenderness Lungs:  Clear with no wheezes HEART:  Regular rate rhythm, no murmurs, no rubs, no clicks Abd:  soft, positive bowel sounds, no organomegally, no rebound, no guarding Ext:  2 plus pulses, no edema, no cyanosis, no clubbing Skin:  No rashes no nodules Neuro:  CN II through XII intact, motor grossly intact  EKG - nsr with first degree AV block  DEVICE  Normal device function.  See PaceArt for details.   Assess/Plan: 1. HCM - the patch has moderate increased risk. She has patchy LGE and NSVT on her cardiac monitor. She has not had syncope. The patient has class 2 dyspnea.  2. NSVT - she is asymptomatic. She has minimal arrhythmia on her monitor.  3. Co-morbidities - The patient's weight loss is concerning to me as the long term treatment is limited.  Ultimately I think the risk/benefit/ration in light of her comorbidities, particularly the weight loss make any possible benefit of a secondary prevention ICD minimal. If her collagen vascular disease were to abate and the  weight loss, resolve, I would be willing to reconsider her treatment options. To that end I will plan to see her back in 6 months. If the patient were to have VT or malignant arrhythmias, then ICD insertion if a secondary treatment would be reconsidered.  Mikle Bosworth.D.

## 2019-03-01 NOTE — Patient Instructions (Addendum)
Medication Instructions:  Your physician recommends that you continue on your current medications as directed. Please refer to the Current Medication list given to you today.  Labwork: None ordered.  Testing/Procedures: None ordered.  Follow-Up: Your physician wants you to follow-up in: 6 months with Dr. Taylor in [place].  You will receive a reminder letter in the mail two months in advance. If you don't receive a letter, please call our office to schedule the follow-up appointment.   Any Other Special Instructions Will Be Listed Below (If Applicable).     If you need a refill on your cardiac medications before your next appointment, please call your pharmacy.   

## 2019-03-02 NOTE — Patient Instructions (Signed)
[facility] lab orders for HOCM placed

## 2019-03-02 NOTE — Addendum Note (Signed)
Addended by: Emmaline Life on: 03/02/2019 01:41 PM   Modules accepted: Orders

## 2019-03-04 ENCOUNTER — Other Ambulatory Visit: Payer: Self-pay

## 2019-03-04 ENCOUNTER — Ambulatory Visit (HOSPITAL_COMMUNITY)
Admission: RE | Admit: 2019-03-04 | Discharge: 2019-03-04 | Disposition: A | Discharge status: Home / Self Care | Payer: Medicare Other | Source: Ambulatory Visit | Attending: Gastroenterology | Admitting: Gastroenterology

## 2019-03-04 ENCOUNTER — Telehealth: Payer: Self-pay | Admitting: *Deleted

## 2019-03-04 DIAGNOSIS — R1310 Dysphagia, unspecified: Secondary | ICD-10-CM | POA: Diagnosis present

## 2019-03-04 DIAGNOSIS — K219 Gastro-esophageal reflux disease without esophagitis: Secondary | ICD-10-CM | POA: Diagnosis present

## 2019-03-04 DIAGNOSIS — R112 Nausea with vomiting, unspecified: Secondary | ICD-10-CM | POA: Insufficient documentation

## 2019-03-04 NOTE — Telephone Encounter (Signed)
Addressed. See result note.

## 2019-03-04 NOTE — Progress Notes (Signed)
BPE with significant dysmotility. Distal esophagus is relatively less distended than the remainder of the thoracic esophagus. Subtle mucosal irregularity of the distal esophagus at the level of the narrowing with inability to exclude esophagitis or potentially developing tumor. Patient needs EGD for further evaluation. Will discuss with cardiology and get clearance for procedure as she was undergoing further evaluation of atrial fibrillation and HOCM.  I reviewed cardiology's last note on 03/01/2019 and it appears they are not doing any further work-up.  Discussed results with patient. She is agreeable to EGD. Her upper GI symptoms are much improved since starting Prilosec twice daily.  Continues with occasional vomiting.  Advise she follow a soft mechanical diet and only consume meats if there ground.  Also advised she small frequent meals rather than 3 large meals and ensure she stays upright for at least 3 hours after eating.   Forwarding to Dr. Buford Dresser for [place].

## 2019-03-04 NOTE — Telephone Encounter (Signed)
Diane from [facility] Radiology called over with urgent report findings.  Made [place] aware and she is reviewing.

## 2019-03-07 ENCOUNTER — Telehealth: Payer: Self-pay | Admitting: Gastroenterology

## 2019-03-07 DIAGNOSIS — R1310 Dysphagia, unspecified: Secondary | ICD-10-CM

## 2019-03-07 DIAGNOSIS — R112 Nausea with vomiting, unspecified: Secondary | ICD-10-CM

## 2019-03-07 DIAGNOSIS — R634 Abnormal weight loss: Secondary | ICD-10-CM

## 2019-03-07 NOTE — Telephone Encounter (Signed)
Pre-op appt 05/17/19 at 9:00am, COVID test at 10:00am. Appt letter mailed with procedure instructions.

## 2019-03-07 NOTE — Telephone Encounter (Signed)
Called pt, EGD/-/+DIL w/Propofol w/RMR scheduled for 05/19/19 at 8:30am. (offered pt 03/24/19 but she wanted to wait until January d/t other appts). Orders entered.  [name], she is not taking any other anticoagulants besides Aspirin 81mg .

## 2019-03-07 NOTE — Telephone Encounter (Signed)
Patient needs EGD +/- dilation with propofol with Dr. Gala Romney for dysphagia, nausea with vomiting, weight loss, and further evaluation of abnormalities noted on BPE with need to rule out esophageal tumor.   [facility] clinical pool, can you arrange this? Can we verify patient is not on any anticoagulants antiplatelets other than aspirin? I spoke with patient last week about the procedure, she is agreeable. Received staff message from Dr. Lovena Le with cardiology who states patient may proceed with EGD. States it was ok to hold anti-coagulation as needed, but as far as I can tell, patient isn't on anything other than aspirin.

## 2019-03-07 NOTE — Telephone Encounter (Signed)
Noted. It would be ideal for patient to have procedure sooner than later with all of her other GI symptoms and concerning findings on BPE. However, if patient wishes not to have procedure until January, that is her choice.

## 2019-03-09 ENCOUNTER — Ambulatory Visit (HOSPITAL_COMMUNITY)
Admission: RE | Admit: 2019-03-09 | Discharge: 2019-03-09 | Disposition: A | Discharge status: Home / Self Care | Payer: Medicare Other | Source: Ambulatory Visit | Attending: "Endocrinology | Admitting: "Endocrinology

## 2019-03-09 ENCOUNTER — Encounter (HOSPITAL_COMMUNITY): Payer: Self-pay

## 2019-03-09 ENCOUNTER — Other Ambulatory Visit: Payer: Self-pay

## 2019-03-09 DIAGNOSIS — E042 Nontoxic multinodular goiter: Secondary | ICD-10-CM | POA: Insufficient documentation

## 2019-03-09 MED ORDER — LIDOCAINE HCL (PF) 2 % IJ SOLN
INTRAMUSCULAR | Status: AC
  Administered 2019-03-09: 10:00:00
  Filled 2019-03-09: qty 10

## 2019-03-09 NOTE — Procedures (Signed)
PreOperative Dx: RIGHT thyroid nodule Postoperative Dx: RIGHT thyroid nodule Procedure:   US guided FNA of RIGHT thyroid nodule Radiologist:  Thornton Papas Anesthesia:  1 ml of 2% lidocaine Specimen:  FNA x 5  EBL:   < 1 ml Complications: None

## 2019-03-10 ENCOUNTER — Other Ambulatory Visit: Payer: Self-pay | Admitting: Internal Medicine

## 2019-03-10 LAB — CYTOLOGY - NON PAP

## 2019-03-11 ENCOUNTER — Other Ambulatory Visit: Payer: Self-pay | Admitting: Family Medicine

## 2019-03-11 DIAGNOSIS — E7849 Other hyperlipidemia: Secondary | ICD-10-CM

## 2019-03-15 ENCOUNTER — Telehealth: Payer: Self-pay | Admitting: Internal Medicine

## 2019-03-15 NOTE — Telephone Encounter (Signed)
PATIENT CALLED TO TELL YOU THAT THE MAILMAN BROUGHT HER LETTER

## 2019-03-15 NOTE — Telephone Encounter (Signed)
Noted  

## 2019-03-17 ENCOUNTER — Telehealth: Payer: Self-pay | Admitting: Family Medicine

## 2019-03-17 NOTE — Telephone Encounter (Signed)
[facility] requesting Serena Colonel to give them a call back at [phone]

## 2019-03-17 NOTE — Telephone Encounter (Signed)
Spoke to them

## 2019-03-22 NOTE — Telephone Encounter (Signed)
error 

## 2019-03-24 ENCOUNTER — Other Ambulatory Visit: Payer: Self-pay | Admitting: Internal Medicine

## 2019-03-24 LAB — HM DIABETES EYE EXAM

## 2019-04-04 ENCOUNTER — Ambulatory Visit: Payer: Self-pay | Admitting: *Deleted

## 2019-04-09 ENCOUNTER — Other Ambulatory Visit: Payer: Self-pay | Admitting: Internal Medicine

## 2019-04-11 ENCOUNTER — Telehealth: Payer: Self-pay | Admitting: Family Medicine

## 2019-04-11 NOTE — Telephone Encounter (Signed)
[place] care is calling requesting to speak with Serena Colonel in regards to the patient. She states she would like to discuss the patient inhome care and her status. shec an be reached at [phone]

## 2019-04-11 NOTE — Telephone Encounter (Signed)
Please check to see if Dr. Dorris Fetch is treating this pt. I did not see a follow up lab since July

## 2019-04-11 NOTE — Telephone Encounter (Signed)
Spoke to caregiver and they will be faxing paperwork over

## 2019-04-11 NOTE — Telephone Encounter (Signed)
Only sees Dr. Dorris Fetch for Dm was being treated by PCP for Thyroid

## 2019-04-11 NOTE — Telephone Encounter (Signed)
Is [name] treating the pts thyroid disease?  The pts labwork still showed an elevated TSH in July.  The patient would need to recheck the TSH and adjust medication appropriately

## 2019-04-12 ENCOUNTER — Other Ambulatory Visit: Payer: Self-pay

## 2019-04-12 DIAGNOSIS — I421 Obstructive hypertrophic cardiomyopathy: Secondary | ICD-10-CM

## 2019-04-19 ENCOUNTER — Ambulatory Visit (INDEPENDENT_AMBULATORY_CARE_PROVIDER_SITE_OTHER): Payer: Medicare Other | Admitting: Internal Medicine

## 2019-04-19 ENCOUNTER — Encounter: Payer: Self-pay | Admitting: Internal Medicine

## 2019-04-19 ENCOUNTER — Other Ambulatory Visit: Payer: Self-pay

## 2019-04-19 VITALS — BP 99/68 | HR 85 | Temp 97.2°F | Ht 69.0 in | Wt 148.2 lb

## 2019-04-19 DIAGNOSIS — R1310 Dysphagia, unspecified: Secondary | ICD-10-CM | POA: Diagnosis not present

## 2019-04-19 NOTE — H&P (View-Only) (Signed)
Primary Care Physician:  Maryruth Hancock, MD Primary Gastroenterologist:  Dr. Gala Romney  Pre-Procedure History & Physical: HPI:  [name] is a 68 y.o. female here for further evaluation of dysphagia.  BPE recently demonstrated some fixed narrowing of the distal esophagus with some irregularity.  Barium pill however passed through this area without delay.  Patient does clinically relate esophageal dysphagia to solids and pills.  EGD last year in [place] demonstrated no significant findings.  Biopsies for H. pylori results unavailable at this time.  Patient is slated for EGD. Cording to low by our GI office notes, patient was treated for H. pylori.  Apparently, follow-up stool antigen has not been done as of yet.  with dilation on May 19, 2019. Other than a baby aspirin, she is not anticoagulated. She takes omeprazole 40 mg twice daily for management of GERD.   Past Medical History:  Diagnosis Date  . Anterolisthesis    Grade 1, L4-5  . Bronchospasm 05/28/2012  . COPD (chronic obstructive pulmonary disease) ([place])   . DEGENERATIVE JOINT DISEASE 10/06/2006  . DEPRESSION 09/26/2008  . DIABETES MELLITUS 10/06/2006  . Diverticulosis    [id number]  . GERD (gastroesophageal reflux disease) 07/25/2013  . HOCM (hypertrophic obstructive cardiomyopathy) ([place])   . HYPERLIPIDEMIA 01/11/2009  . HYPERTENSION 10/06/2006  . Hypokalemia   . Hypomagnesemia   . INSOMNIA 09/26/2008  . Internal hemorrhoids   . OBSTRUCTIVE SLEEP APNEA 06/23/2008   Severe OSA per sleep study 2010, Rx a CPAP  . Pain in joint, multiple sites 11/10/2006  . Pericardial effusion   . Pericarditis   . UNSPECIFIED ANEMIA 12/10/2009  . UTI (urinary tract infection) 11/2017    Past Surgical History:  Procedure Laterality Date  . BIOPSY  12/28/2017   Procedure: BIOPSY;  Surgeon: Irving Copas., MD;  Location: [facility];  Service: Gastroenterology;;  . COLONOSCOPY  08/01/2011   Procedure: COLONOSCOPY;  Surgeon:  Inda Castle, MD;  [facility];  Service: Endoscopy;  Laterality: N/A;. hyperplastic polyp removed, hemorrhoids s/p banding, diverticulosis, next tcs 10 years.   . ESOPHAGOGASTRODUODENOSCOPY (EGD) WITH PROPOFOL N/A 12/28/2017   Procedure: ESOPHAGOGASTRODUODENOSCOPY (EGD) WITH PROPOFOL;  Surgeon: Rush Landmark Telford Nab., MD;  Location: [facility];  Service: Gastroenterology;  Laterality: N/A; h.pylori gastiris, tortuous esophagus with esophageal bx (no EOE), medium sized hiatal hernia  . LEFT HEART CATH AND CORONARY ANGIOGRAPHY N/A 10/22/2017   Procedure: LEFT HEART CATH AND CORONARY ANGIOGRAPHY;  Surgeon: Jettie Booze, MD;  Location: [facility];  Service: Cardiovascular;  Laterality: N/A;  . 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 Right knee replacement  2005    Prior to Admission medications   Medication Sig Start Date End Date Taking? Authorizing Provider  Accu-Chek Softclix Lancets lancets USE TO CHECK BLOOD SUGAR TWICE A DAY 10/28/18  Yes Paz, Alda Berthold, MD  albuterol (VENTOLIN HFA) 108 (90 Base) MCG/ACT inhaler Inhale 2 puffs into the lungs every 4 (four) hours as needed for wheezing or shortness of breath. 10/28/18  Yes Paz, Alda Berthold, MD  alum & mag hydroxide-simeth (MAALOX/MYLANTA) 200-200-20 MG/5ML suspension Take 30 mLs by mouth every 4 (four) hours as needed for indigestion. 12/11/18  Yes Shah, Pratik D, DO  amLODipine (NORVASC) 10 MG tablet Take 1 tablet (10 mg total) by mouth daily. 08/12/18 08/07/19 Yes Turner, Eber Hong, MD  ARIPiprazole (ABILIFY) 5 MG tablet Take 1 tablet by mouth daily. 02/10/19  Yes [provider]  aspirin EC 81 MG tablet  Take 1 tablet (81 mg total) by mouth daily. 08/13/18  Yes Paz, Alda Berthold, MD  escitalopram (LEXAPRO) 20 MG tablet TAKE 1 TABLET BY MOUTH  DAILY 03/11/19  Yes Corum, Rex Kras, MD  Fluticasone-Salmeterol ([place] INHUB) 250-50 MCG/DOSE AEPB Inhale 1 puff into the lungs 2 (two) times daily. 10/28/18  Yes [provider]  glucose  blood (ACCU-CHEK AVIVA PLUS) test strip TEST BLOOD SUGAR TWICE DAILY AND LANCETS TWICE DAILY E11.9 10/27/18  Yes Paz, Jacqulyn Bath E, MD  latanoprost (XALATAN) 0.005 % ophthalmic solution Place 1 drop into both eyes at bedtime.   Yes [provider]  levothyroxine (SYNTHROID) 75 MCG tablet Take 1 tablet (75 mcg total) by mouth daily before breakfast. 11/30/18  Yes Paz, Alda Berthold, MD  omeprazole (PRILOSEC) 40 MG capsule Take 1 capsule (40 mg total) by mouth 2 (two) times daily before lunch and supper. 02/21/19  Yes Aliene Altes S, PA-C  ondansetron (ZOFRAN) 4 MG tablet Take 1 tablet (4 mg total) by mouth every 6 (six) hours as needed for nausea. 12/11/18  Yes Shah, Pratik D, DO  OVER THE COUNTER MEDICATION CPAP: At bedtime   Yes [provider]  OXYGEN Inhale 2 L into the lungs continuous.    Yes [provider]  potassium chloride SA (K-DUR) 20 MEQ tablet Take 1 tablet (20 mEq total) by mouth 2 (two) times daily. 11/30/18  Yes Paz, Alda Berthold, MD  rosuvastatin (CRESTOR) 40 MG tablet TAKE 1 TABLET BY MOUTH IN  THE EVENING 03/11/19  Yes Corum, Rex Kras, MD  carvedilol (COREG) 25 MG tablet Take 1 tablet (25 mg total) by mouth 2 (two) times daily. 12/24/18 03/24/19  Charlie Pitter, PA-C    Allergies as of 04/19/2019  . (No Known Allergies)    Family History  Problem Relation Age of Onset  . Asthma Mother   . Stroke Mother   . Diabetes Mother   . Diabetes Other        M, B, S  . Hypertension Sister        M, S,B  . Pancreatic cancer Brother   . Colon cancer Neg Hx   . Prostate cancer Neg Hx   . Breast cancer Neg Hx   . Esophageal cancer Neg Hx   . Liver disease Neg Hx   . Rectal cancer Neg Hx   . Stomach cancer Neg Hx   . Inflammatory bowel disease Neg Hx     Social History   Socioeconomic History  . Marital status: Widowed    Spouse name: Not on file  . Number of children: 4  . Years of education: Not on file  . Highest education level: Not on file  Occupational History    . Occupation: disability    Employer: UNEMPLOYED  Social Needs  . Financial resource strain: Not on file  . Food insecurity    Worry: Not on file    Inability: Not on file  . Transportation needs    Medical: Not on file    Non-medical: Not on file  Tobacco Use  . Smoking status: Former Smoker    Packs/day: 0.20    Years: 4.00    Pack years: 0.80    Types: Cigarettes    Quit date: 05/13/1995    Years since quitting: 23.9  . Smokeless tobacco: Never Used  Substance and Sexual Activity  . Alcohol use: Not Currently  . Drug use: No  . Sexual activity: Not Currently  Lifestyle  .  Physical activity    Days per week: Not on file    Minutes per session: Not on file  . Stress: Not on file  Relationships  . Social Herbalist on phone: Not on file    Gets together: Not on file    Attends religious service: Not on file    Active member of club or organization: Not on file    Attends meetings of clubs or organizations: Not on file    Relationship status: Not on file  . Intimate partner violence    Fear of current or ex partner: Not on file    Emotionally abused: Not on file    Physically abused: Not on file    Forced sexual activity: Not on file  Other Topics Concern  . Not on file  Social History Narrative   Widow , lives by herself   Lost a son, 3 living    No children in [place], [place] lives in [place], he visits and helps w/ shopping    Lost husband    Review of Systems: See HPI, otherwise negative ROS  Physical Exam: BP 99/68   Pulse 85   Temp (!) 97.2 F (36.2 C) (Oral)   Ht 5\' 9"  (1.753 m)   Wt 148 lb 3.2 oz (67.2 kg)   BMI 21.89 kg/m  General:   Alert,  Well-developed, well-nourished, pleasant and cooperative in NAD Skin:  Intact without significant lesions or rashes. Neck:  Supple; no masses or thyromegaly. No significant cervical adenopathy. Lungs:  Clear throughout to auscultation.   No wheezes, crackles, or rhonchi. No acute distress. Heart:   Regular rate and rhythm; no murmurs, clicks, rubs,  or gallops. Abdomen: Non-distended, normal bowel sounds.  Soft and nontender without appreciable mass or hepatosplenomegaly.  Pulses:  Normal pulses noted. Extremities:  Without clubbing or edema.  Impression/Plan: 68 year old lady with longstanding GERD now with esophageal dysphagia.  Dysmotility seen on BPE but some what a fixed narrowing of the GE junction with irregularity.  Barium tablet passed through this area without difficulty. Clinically, longstanding GERD and esophageal dysphagia.  She would likely benefit from an EGD with To be large bore dilation as feasible/appropriate.  We will also consider taking the opportunity to rebiopsy to demonstrate eradication of H. pylori. History of H. pylori infection (on histology) treated [facility] our notes.  Eradication has not been proven as of yet.  The risks, benefits, limitations, alternatives and imponderables have been reviewed with the patient. Potential for esophageal dilation, biopsy, etc. have also been reviewed.  Questions have been answered. All parties agreeable. Further recommendations to follow.   Notice: This dictation was prepared with Dragon dictation along with smaller phrase technology. Any transcriptional errors that result from this process are unintentional and may not be corrected upon review.

## 2019-04-19 NOTE — Progress Notes (Signed)
Primary Care Physician:  Maryruth Hancock, MD Primary Gastroenterologist:  Dr. Gala Romney  Pre-Procedure History & Physical: HPI:  [name] is a 68 y.o. female here for further evaluation of dysphagia.  BPE recently demonstrated some fixed narrowing of the distal esophagus with some irregularity.  Barium pill however passed through this area without delay.  Patient does clinically relate esophageal dysphagia to solids and pills.  EGD last year in [place] demonstrated no significant findings.  Biopsies for H. pylori results unavailable at this time.  Patient is slated for EGD. Cording to low by our GI office notes, patient was treated for H. pylori.  Apparently, follow-up stool antigen has not been done as of yet.  with dilation on May 19, 2019. Other than a baby aspirin, she is not anticoagulated. She takes omeprazole 40 mg twice daily for management of GERD.   Past Medical History:  Diagnosis Date  . Anterolisthesis    Grade 1, L4-5  . Bronchospasm 05/28/2012  . COPD (chronic obstructive pulmonary disease) ([place])   . DEGENERATIVE JOINT DISEASE 10/06/2006  . DEPRESSION 09/26/2008  . DIABETES MELLITUS 10/06/2006  . Diverticulosis    [id number]  . GERD (gastroesophageal reflux disease) 07/25/2013  . HOCM (hypertrophic obstructive cardiomyopathy) ([place])   . HYPERLIPIDEMIA 01/11/2009  . HYPERTENSION 10/06/2006  . Hypokalemia   . Hypomagnesemia   . INSOMNIA 09/26/2008  . Internal hemorrhoids   . OBSTRUCTIVE SLEEP APNEA 06/23/2008   Severe OSA per sleep study 2010, Rx a CPAP  . Pain in joint, multiple sites 11/10/2006  . Pericardial effusion   . Pericarditis   . UNSPECIFIED ANEMIA 12/10/2009  . UTI (urinary tract infection) 11/2017    Past Surgical History:  Procedure Laterality Date  . BIOPSY  12/28/2017   Procedure: BIOPSY;  Surgeon: Irving Copas., MD;  Location: [facility];  Service: Gastroenterology;;  . COLONOSCOPY  08/01/2011   Procedure: COLONOSCOPY;  Surgeon:  Inda Castle, MD;  [facility];  Service: Endoscopy;  Laterality: N/A;. hyperplastic polyp removed, hemorrhoids s/p banding, diverticulosis, next tcs 10 years.   . ESOPHAGOGASTRODUODENOSCOPY (EGD) WITH PROPOFOL N/A 12/28/2017   Procedure: ESOPHAGOGASTRODUODENOSCOPY (EGD) WITH PROPOFOL;  Surgeon: Rush Landmark Telford Nab., MD;  Location: [facility];  Service: Gastroenterology;  Laterality: N/A; h.pylori gastiris, tortuous esophagus with esophageal bx (no EOE), medium sized hiatal hernia  . LEFT HEART CATH AND CORONARY ANGIOGRAPHY N/A 10/22/2017   Procedure: LEFT HEART CATH AND CORONARY ANGIOGRAPHY;  Surgeon: Jettie Booze, MD;  Location: [facility];  Service: Cardiovascular;  Laterality: N/A;  . 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 Right knee replacement  2005    Prior to Admission medications   Medication Sig Start Date End Date Taking? Authorizing Provider  Accu-Chek Softclix Lancets lancets USE TO CHECK BLOOD SUGAR TWICE A DAY 10/28/18  Yes Paz, Alda Berthold, MD  albuterol (VENTOLIN HFA) 108 (90 Base) MCG/ACT inhaler Inhale 2 puffs into the lungs every 4 (four) hours as needed for wheezing or shortness of breath. 10/28/18  Yes Paz, Alda Berthold, MD  alum & mag hydroxide-simeth (MAALOX/MYLANTA) 200-200-20 MG/5ML suspension Take 30 mLs by mouth every 4 (four) hours as needed for indigestion. 12/11/18  Yes Shah, Pratik D, DO  amLODipine (NORVASC) 10 MG tablet Take 1 tablet (10 mg total) by mouth daily. 08/12/18 08/07/19 Yes Turner, Eber Hong, MD  ARIPiprazole (ABILIFY) 5 MG tablet Take 1 tablet by mouth daily. 02/10/19  Yes [provider]  aspirin EC 81 MG tablet  Take 1 tablet (81 mg total) by mouth daily. 08/13/18  Yes Paz, Alda Berthold, MD  escitalopram (LEXAPRO) 20 MG tablet TAKE 1 TABLET BY MOUTH  DAILY 03/11/19  Yes Corum, Rex Kras, MD  Fluticasone-Salmeterol ([place] INHUB) 250-50 MCG/DOSE AEPB Inhale 1 puff into the lungs 2 (two) times daily. 10/28/18  Yes [provider]  glucose  blood (ACCU-CHEK AVIVA PLUS) test strip TEST BLOOD SUGAR TWICE DAILY AND LANCETS TWICE DAILY E11.9 10/27/18  Yes Paz, Jacqulyn Bath E, MD  latanoprost (XALATAN) 0.005 % ophthalmic solution Place 1 drop into both eyes at bedtime.   Yes [provider]  levothyroxine (SYNTHROID) 75 MCG tablet Take 1 tablet (75 mcg total) by mouth daily before breakfast. 11/30/18  Yes Paz, Alda Berthold, MD  omeprazole (PRILOSEC) 40 MG capsule Take 1 capsule (40 mg total) by mouth 2 (two) times daily before lunch and supper. 02/21/19  Yes Aliene Altes S, PA-C  ondansetron (ZOFRAN) 4 MG tablet Take 1 tablet (4 mg total) by mouth every 6 (six) hours as needed for nausea. 12/11/18  Yes Shah, Pratik D, DO  OVER THE COUNTER MEDICATION CPAP: At bedtime   Yes [provider]  OXYGEN Inhale 2 L into the lungs continuous.    Yes [provider]  potassium chloride SA (K-DUR) 20 MEQ tablet Take 1 tablet (20 mEq total) by mouth 2 (two) times daily. 11/30/18  Yes Paz, Alda Berthold, MD  rosuvastatin (CRESTOR) 40 MG tablet TAKE 1 TABLET BY MOUTH IN  THE EVENING 03/11/19  Yes Corum, Rex Kras, MD  carvedilol (COREG) 25 MG tablet Take 1 tablet (25 mg total) by mouth 2 (two) times daily. 12/24/18 03/24/19  Charlie Pitter, PA-C    Allergies as of 04/19/2019  . (No Known Allergies)    Family History  Problem Relation Age of Onset  . Asthma Mother   . Stroke Mother   . Diabetes Mother   . Diabetes Other        M, B, S  . Hypertension Sister        M, S,B  . Pancreatic cancer Brother   . Colon cancer Neg Hx   . Prostate cancer Neg Hx   . Breast cancer Neg Hx   . Esophageal cancer Neg Hx   . Liver disease Neg Hx   . Rectal cancer Neg Hx   . Stomach cancer Neg Hx   . Inflammatory bowel disease Neg Hx     Social History   Socioeconomic History  . Marital status: Widowed    Spouse name: Not on file  . Number of children: 4  . Years of education: Not on file  . Highest education level: Not on file  Occupational History   . Occupation: disability    Employer: UNEMPLOYED  Social Needs  . Financial resource strain: Not on file  . Food insecurity    Worry: Not on file    Inability: Not on file  . Transportation needs    Medical: Not on file    Non-medical: Not on file  Tobacco Use  . Smoking status: Former Smoker    Packs/day: 0.20    Years: 4.00    Pack years: 0.80    Types: Cigarettes    Quit date: 05/13/1995    Years since quitting: 23.9  . Smokeless tobacco: Never Used  Substance and Sexual Activity  . Alcohol use: Not Currently  . Drug use: No  . Sexual activity: Not Currently  Lifestyle  . Physical  activity    Days per week: Not on file    Minutes per session: Not on file  . Stress: Not on file  Relationships  . Social Herbalist on phone: Not on file    Gets together: Not on file    Attends religious service: Not on file    Active member of club or organization: Not on file    Attends meetings of clubs or organizations: Not on file    Relationship status: Not on file  . Intimate partner violence    Fear of current or ex partner: Not on file    Emotionally abused: Not on file    Physically abused: Not on file    Forced sexual activity: Not on file  Other Topics Concern  . Not on file  Social History Narrative   Widow , lives by herself   Lost a son, 3 living    No children in [place], [place] lives in [place], he visits and helps w/ shopping    Lost husband    Review of Systems: See HPI, otherwise negative ROS  Physical Exam: BP 99/68   Pulse 85   Temp (!) 97.2 F (36.2 C) (Oral)   Ht 5\' 9"  (1.753 m)   Wt 148 lb 3.2 oz (67.2 kg)   BMI 21.89 kg/m  General:   Alert,  Well-developed, well-nourished, pleasant and cooperative in NAD Skin:  Intact without significant lesions or rashes. Neck:  Supple; no masses or thyromegaly. No significant cervical adenopathy. Lungs:  Clear throughout to auscultation.   No wheezes, crackles, or rhonchi. No acute distress. Heart:   Regular rate and rhythm; no murmurs, clicks, rubs,  or gallops. Abdomen: Non-distended, normal bowel sounds.  Soft and nontender without appreciable mass or hepatosplenomegaly.  Pulses:  Normal pulses noted. Extremities:  Without clubbing or edema.  Impression/Plan: 68 year old lady with longstanding GERD now with esophageal dysphagia.  Dysmotility seen on BPE but some what a fixed narrowing of the GE junction with irregularity.  Barium tablet passed through this area without difficulty. Clinically, longstanding GERD and esophageal dysphagia.  She would likely benefit from an EGD with To be large bore dilation as feasible/appropriate.  We will also consider taking the opportunity to rebiopsy to demonstrate eradication of H. pylori. History of H. pylori infection (on histology) treated [facility] our notes.  Eradication has not been proven as of yet.  The risks, benefits, limitations, alternatives and imponderables have been reviewed with the patient. Potential for esophageal dilation, biopsy, etc. have also been reviewed.  Questions have been answered. All parties agreeable. Further recommendations to follow.   Notice: This dictation was prepared with Dragon dictation along with smaller phrase technology. Any transcriptional errors that result from this process are unintentional and may not be corrected upon review.

## 2019-04-19 NOTE — Patient Instructions (Signed)
Keep appointment for EGD with possible esophageal dilation 05/19/19  No change in medication for now  Further recommendations to follow

## 2019-04-20 ENCOUNTER — Telehealth: Payer: Self-pay | Admitting: Family Medicine

## 2019-04-20 NOTE — Telephone Encounter (Signed)
Charlesetta Ivory is calling from [facility] and requesting a call back from [facility] in regards to patient.  [phone]

## 2019-04-20 NOTE — Telephone Encounter (Signed)
Spoke to [place] regarding PCS forms

## 2019-04-21 ENCOUNTER — Ambulatory Visit: Payer: Medicare Other | Admitting: Genetic Counselor

## 2019-04-21 ENCOUNTER — Other Ambulatory Visit: Payer: Self-pay

## 2019-04-25 ENCOUNTER — Telehealth: Payer: Self-pay

## 2019-04-25 DIAGNOSIS — E039 Hypothyroidism, unspecified: Secondary | ICD-10-CM

## 2019-04-25 MED ORDER — LEVOTHYROXINE SODIUM 75 MCG PO TABS: 75.0000 ug | ORAL_TABLET | Freq: Every day | ORAL | 1 refills | Status: DC

## 2019-04-25 NOTE — Telephone Encounter (Signed)
[name], CMA  

## 2019-04-28 ENCOUNTER — Ambulatory Visit (INDEPENDENT_AMBULATORY_CARE_PROVIDER_SITE_OTHER): Payer: Medicare Other | Admitting: "Endocrinology

## 2019-04-28 ENCOUNTER — Encounter: Payer: Self-pay | Admitting: "Endocrinology

## 2019-04-28 ENCOUNTER — Other Ambulatory Visit: Payer: Self-pay

## 2019-04-28 VITALS — BP 117/73 | HR 85 | Ht 69.0 in | Wt 152.0 lb

## 2019-04-28 DIAGNOSIS — E042 Nontoxic multinodular goiter: Secondary | ICD-10-CM | POA: Diagnosis not present

## 2019-04-28 DIAGNOSIS — R634 Abnormal weight loss: Secondary | ICD-10-CM | POA: Insufficient documentation

## 2019-04-28 DIAGNOSIS — E119 Type 2 diabetes mellitus without complications: Secondary | ICD-10-CM | POA: Diagnosis not present

## 2019-04-28 DIAGNOSIS — E039 Hypothyroidism, unspecified: Secondary | ICD-10-CM | POA: Diagnosis not present

## 2019-04-28 LAB — POCT GLYCOSYLATED HEMOGLOBIN (HGB A1C): Hemoglobin A1C: 5.2 % (ref 4.0–5.6)

## 2019-04-28 MED ORDER — LEVOTHYROXINE SODIUM 88 MCG PO TABS: 88.0000 ug | ORAL_TABLET | Freq: Every day | ORAL | 1 refills | Status: AC

## 2019-04-28 NOTE — Progress Notes (Signed)
04/28/2019, 5:58 PM   Endocrinology follow-up note  Subjective:    Patient ID: [name], female    DOB: 08/14/1950.  [name] is being seen in follow-up after she was seen recently in consultation for management of longstanding diabetes, nodular goiter, hypothyroidism.    PMD:   Melissa Hancock, MD.   Past Medical History:  Diagnosis Date  . Anterolisthesis    Grade 1, L4-5  . Bronchospasm 05/28/2012  . COPD (chronic obstructive pulmonary disease) ([place])   . DEGENERATIVE JOINT DISEASE 10/06/2006  . DEPRESSION 09/26/2008  . DIABETES MELLITUS 10/06/2006  . Diverticulosis    [id number]  . GERD (gastroesophageal reflux disease) 07/25/2013  . HOCM (hypertrophic obstructive cardiomyopathy) ([place])   . HYPERLIPIDEMIA 01/11/2009  . HYPERTENSION 10/06/2006  . Hypokalemia   . Hypomagnesemia   . INSOMNIA 09/26/2008  . Internal hemorrhoids   . OBSTRUCTIVE SLEEP APNEA 06/23/2008   Severe OSA per sleep study 2010, Rx a CPAP  . Pain in joint, multiple sites 11/10/2006  . Pericardial effusion   . Pericarditis   . UNSPECIFIED ANEMIA 12/10/2009  . UTI (urinary tract infection) 11/2017    Past Surgical History:  Procedure Laterality Date  . BIOPSY  12/28/2017   Procedure: BIOPSY;  Surgeon: Irving Copas., MD;  Location: [facility];  Service: Gastroenterology;;  . COLONOSCOPY  08/01/2011   Procedure: COLONOSCOPY;  Surgeon: Inda Castle, MD;  Location: [facility];  Service: Endoscopy;  Laterality: N/A;. hyperplastic polyp removed, hemorrhoids s/p banding, diverticulosis, next tcs 10 years.   . ESOPHAGOGASTRODUODENOSCOPY (EGD) WITH PROPOFOL N/A 12/28/2017   Procedure: ESOPHAGOGASTRODUODENOSCOPY (EGD) WITH PROPOFOL;  Surgeon: Rush Landmark Telford Nab., MD;  Location: [facility];  Service: Gastroenterology;  Laterality: N/A; h.pylori gastiris, tortuous esophagus with esophageal bx (no EOE),  medium sized hiatal hernia  . LEFT HEART CATH AND CORONARY ANGIOGRAPHY N/A 10/22/2017   Procedure: LEFT HEART CATH AND CORONARY ANGIOGRAPHY;  Surgeon: Jettie Booze, MD;  Location: [facility];  Service: Cardiovascular;  Laterality: N/A;  . 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 Right knee replacement  2005    Social History   Socioeconomic History  . Marital status: Widowed    Spouse name: Not on file  . Number of children: 4  . Years of education: Not on file  . Highest education level: Not on file  Occupational History  . Occupation: disability    Employer: UNEMPLOYED  Tobacco Use  . Smoking status: Former Smoker    Packs/day: 0.20    Years: 4.00    Pack years: 0.80    Types: Cigarettes    Quit date: 05/13/1995    Years since quitting: 23.9  . Smokeless tobacco: Never Used  Substance and Sexual Activity  . Alcohol use: Not Currently  . Drug use: No  . Sexual activity: Not Currently  Other Topics Concern  . Not on file  Social History Narrative   Widow , lives by herself   Lost a son, 3 living    No children in [place], [place] lives in [place], he visits and helps w/ shopping  Lost husband   Social Determinants of Radio broadcast assistant Strain:   . Difficulty of Paying Living Expenses: Not on file  Food Insecurity:   . Worried About Charity fundraiser in the Last Year: Not on file  . Ran Out of Food in the Last Year: Not on file  Transportation Needs:   . Lack of Transportation (Medical): Not on file  . Lack of Transportation (Non-Medical): Not on file  Physical Activity:   . Days of Exercise per Week: Not on file  . Minutes of Exercise per Session: Not on file  Stress:   . Feeling of Stress : Not on file  Social Connections:   . Frequency of Communication with Friends and Family: Not on file  . Frequency of Social Gatherings with Friends and Family: Not on file  . Attends Religious Services: Not on file  . Active Member of Clubs or Organizations:  Not on file  . Attends Archivist Meetings: Not on file  . Marital Status: Not on file    Family History  Problem Relation Age of Onset  . Asthma Mother   . Stroke Mother   . Diabetes Mother   . Diabetes Other        M, B, S  . Hypertension Sister        M, S,B  . Pancreatic cancer Brother   . Colon cancer Neg Hx   . Prostate cancer Neg Hx   . Breast cancer Neg Hx   . Esophageal cancer Neg Hx   . Liver disease Neg Hx   . Rectal cancer Neg Hx   . Stomach cancer Neg Hx   . Inflammatory bowel disease Neg Hx     Outpatient Encounter Medications as of 04/28/2019  Medication Sig  . Accu-Chek Softclix Lancets lancets USE TO CHECK BLOOD SUGAR TWICE A DAY  . albuterol (VENTOLIN HFA) 108 (90 Base) MCG/ACT inhaler Inhale 2 puffs into the lungs every 4 (four) hours as needed for wheezing or shortness of breath.  Marland Kitchen alum & mag hydroxide-simeth (MAALOX/MYLANTA) 200-200-20 MG/5ML suspension Take 30 mLs by mouth every 4 (four) hours as needed for indigestion.  Marland Kitchen amLODipine (NORVASC) 10 MG tablet Take 1 tablet (10 mg total) by mouth daily.  . ARIPiprazole (ABILIFY) 5 MG tablet Take 1 tablet by mouth daily.  Marland Kitchen aspirin EC 81 MG tablet Take 1 tablet (81 mg total) by mouth daily.  . carvedilol (COREG) 25 MG tablet Take 1 tablet (25 mg total) by mouth 2 (two) times daily.  Marland Kitchen escitalopram (LEXAPRO) 20 MG tablet TAKE 1 TABLET BY MOUTH  DAILY  . Fluticasone-Salmeterol (WIXELA INHUB) 250-50 MCG/DOSE AEPB Inhale 1 puff into the lungs 2 (two) times daily.  Marland Kitchen glucose blood (ACCU-CHEK AVIVA PLUS) test strip TEST BLOOD SUGAR TWICE DAILY AND LANCETS TWICE DAILY E11.9  . latanoprost (XALATAN) 0.005 % ophthalmic solution Place 1 drop into both eyes at bedtime.  Marland Kitchen levothyroxine (SYNTHROID) 88 MCG tablet Take 1 tablet (88 mcg total) by mouth daily before breakfast.  . omeprazole (PRILOSEC) 40 MG capsule Take 1 capsule (40 mg total) by mouth 2 (two) times daily before lunch and supper.  . ondansetron  (ZOFRAN) 4 MG tablet Take 1 tablet (4 mg total) by mouth every 6 (six) hours as needed for nausea.  Marland Kitchen OVER THE COUNTER MEDICATION CPAP: At bedtime  . OXYGEN Inhale 2 L into the lungs continuous.   . potassium chloride SA (K-DUR) 20 MEQ tablet Take 1  tablet (20 mEq total) by mouth 2 (two) times daily.  . rosuvastatin (CRESTOR) 40 MG tablet TAKE 1 TABLET BY MOUTH IN  THE EVENING  . [DISCONTINUED] levothyroxine (SYNTHROID) 75 MCG tablet Take 1 tablet (75 mcg total) by mouth daily before breakfast.   No facility-administered encounter medications on file as of 04/28/2019.    ALLERGIES: No Known Allergies  VACCINATION STATUS: Immunization History  Administered Date(s) Administered  . Influenza Whole 05/12/2006, 03/15/2007  . Influenza, High Dose Seasonal PF 02/22/2015, 02/25/2016, 03/31/2017, 02/24/2018  . Influenza, Seasonal, Injecte, Preservative Fre 05/28/2012  . Influenza,inj,Quad PF,6+ Mos 05/12/2012, 04/25/2013, 06/01/2014  . Pneumococcal Conjugate-13 10/26/2013  . Pneumococcal Polysaccharide-23 05/28/2012  . Td 08/17/2006, 03/31/2017  . Zoster 10/29/2012    HPI  68 year old female patient with multiple medical problems as above.  She is returning for follow-up after she was seen in consult for history of diabetes currently not on any treatment.    Her point-of-care A1c is 5.2%, remains stable compared to 5.5% during her last visit.   -She has rare and random tightening of glycemia to 60s, without symptoms. -She has gained 4 pounds since last visit, good development for her.   She underwent GI work-up with esophagogastroduodenoscopy, was remarkable for only chronic active gastritis with Helicobacter pylori organisms, no evidence of dysplasia or malignancy. She does on and off nausea, vomiting, no diarrhea.  Her a.m. cortisol is 7, marginal not indicating adrenal insufficiency.  She also has hypothyroidism which required support with thyroid hormone currently on levothyroxine 75  mcg p.o. every morning. Her previsit thyroid function tests are consistent with inadequate replacement. She denies palpitations, tremors, nor heat intolerance.  She denies dysphagia, shortness of breath, nor voice change. She is a  poor historian, does not seem to remember the details of her medical history.  She does not have good social support, lives alone.  She admits that she does not eat well, mostly does not cook due to depression.   Review of Systems  Constitutional: + Fluctuating body weight,  + fatigue, no subjective hyperthermia, no subjective hypothermia Eyes: no blurry vision, no xerophthalmia ENT: no sore throat, no nodules palpated in throat, no dysphagia/odynophagia, no hoarseness  Musculoskeletal: + Diffuse body aches, status post left knee replacement, disequilibrium requiring walker candidate.   Skin: no rashes Neurological: no tremors, no numbness, no tingling, no dizziness Psychiatric: + depression, + anxiety   Objective:    BP 117/73   Pulse 85   Ht 5\' 9"  (1.753 m)   Wt 152 lb (68.9 kg)   BMI 22.45 kg/m   Wt Readings from Last 3 Encounters:  04/28/19 152 lb (68.9 kg)  04/19/19 148 lb 3.2 oz (67.2 kg)  03/01/19 150 lb (68 kg)     Physical Exam  physical Exam- Limited  Constitutional:  Body mass index is 22.45 kg/m. , not in acute distress, normal state of mind Eyes:  EOMI, no exophthalmos Neck: Supple Respiratory: Adequate breathing efforts Musculoskeletal: +Uses a walker to ambulate.  No gross deformities, strength intact in all four extremities, no gross restriction of joint movements Skin:  no rashes, no hyperemia Neurological: no tremor with outstretched hands.   CMP ( most recent) CMP     Component Value Date/Time   NA 139 12/28/2018 0955   NA 141 11/16/2018 0909   K 4.0 12/28/2018 0955   CL 103 12/28/2018 0955   CO2 27 12/28/2018 0955   GLUCOSE 74 12/28/2018 0955   BUN 16 12/28/2018 0955  BUN 10 11/16/2018 0909   CREATININE 0.83  12/28/2018 0955   CALCIUM 9.0 12/28/2018 0955   PROT 7.2 12/28/2018 0955   ALBUMIN 3.3 (L) 12/11/2018 0608   AST 15 12/28/2018 0955   ALT 6 12/28/2018 0955   ALKPHOS 273 (H) 12/11/2018 0608   BILITOT 0.5 12/28/2018 0955   GFRNONAA >60 12/11/2018 0608   GFRAA >60 12/11/2018 0608     Diabetic Labs (most recent): Lab Results  Component Value Date   HGBA1C 5.2 04/28/2019   HGBA1C 5.5 12/09/2018   HGBA1C 5.0 06/01/2018     Lipid Panel ( most recent) Lipid Panel     Component Value Date/Time   CHOL 94 11/18/2017 0012   TRIG 110 11/18/2017 0012   HDL 30 (L) 11/18/2017 0012   CHOLHDL 3.1 11/18/2017 0012   VLDL 22 11/18/2017 0012   LDLCALC 42 11/18/2017 0012    Results for [name], [name] (MRN [id number]) as of 04/28/2019 17:58  Ref. Range 02/22/2019 11:07 02/22/2019 11:08 04/28/2019 13:28  Cortisol - AM Latest Ref Range: 6.7 - 22.6 ug/dL 7.0    Hemoglobin A1C Latest Ref Range: 4.0 - 5.6 %   5.2  TSH Latest Ref Range: 0.350 - 4.500 uIU/mL 5.421 (H)    T4,Free(Direct) Latest Ref Range: 0.61 - 1.12 ng/dL 0.93    Thyroperoxidase Ab SerPl-aCnc Latest Ref Range: 0 - 34 IU/mL  104 (H)   Thyroglobulin Antibody Latest Ref Range: 0.0 - 0.9 IU/mL  <1.0      Review of her records show multinodular goiter based on ultrasound from August 2019: Right lobe 5.1 cm x 1.9 cm x 2.3 cm,  2.2 cm nodule on the right  Lobe was amenable for biopsy, but no report card of biopsy on her thyroid. Left lobe 4.1 cm x 1.1 0.1 cm   Fine-needle aspiration on March 09, 2019:  A. THYROID, RIGHT, FINE NEEDLE ASPIRATION:  FINAL MICROSCOPIC DIAGNOSIS:  - Consistent with benign follicular nodule (Bethesda category II)   Assessment & Plan:   1. Hypothyroidism 2.  Nodular goiter  3. Type 2 diabetes mellitus without complication, without long-term current use of insulin  4.  Random hypoglycemia  -Given her A1c of 5.2% and random mild hypoglycemia mostly related to malnutrition, she will not require  intervention for diabetes treatment at this time.    Patient with apparent nutritional deficiency with relatively rapid unintended weight loss.  Her a.m. cortisol is marginal at 7, she will need ACTH stimulation test to rule out adrenal insufficiency in the next several days.  She will be contacted if abnormal.   -She has psychosocial issues which interfere with her care, with no or nonexistent social support.  -I discussed with her to be more generous in taking carbs including pasta, rice, ground meat, fruits.  -She would benefit from evaluation by social services, for possible arrangement for home aide.  Regarding her hypothyroidism, her recent thyroid function tests are consistent with under replacement.  I discussed and increase her levothyroxine to 88 mcg p.o. daily before breakfast.   - We discussed about the correct intake of her thyroid hormone, on empty stomach at fasting, with water, separated by at least 30 minutes from breakfast and other medications,  and separated by more than 4 hours from calcium, iron, multivitamins, acid reflux medications (PPIs). -Patient is made aware of the fact that thyroid hormone replacement is needed for life, dose to be adjusted by periodic monitoring of thyroid function tests.  Multinodular goiter: She has a  documented, 2.2 cm suspicious nodule on her right lobe.  Her subsequent fine-needle aspiration was negative for malignancy.  She will not need intervention at this time.     Follow up plan: - Return in about 6 months (around 10/27/2019) for Follow up with Pre-visit Labs, Next Visit A1c in Office.  Glade Lloyd, MD [facility] [address] [place], [place] [address] Phone: [phone]  Fax: [phone]    04/28/2019, 5:58 PM  This note was partially dictated with voice recognition software. Similar sounding words can be transcribed inadequately or may not  be corrected upon review.

## 2019-04-29 ENCOUNTER — Telehealth: Payer: Self-pay | Admitting: "Endocrinology

## 2019-04-29 NOTE — Telephone Encounter (Signed)
Patient is aware to return my call

## 2019-04-29 NOTE — Telephone Encounter (Signed)
Dr Dorris Fetch sent me this- can you call this patient and explain to her why he ordered this and wants it done ASAP.   Below message from Dr Dorris Fetch:: I ordered ACTH stim test for this after she left clinic. She needs to be contacted to complete this test as soon as possible.  Thank you.

## 2019-04-29 NOTE — Telephone Encounter (Signed)
Spoke w/ pt and she is going on 12/31 due to transportation

## 2019-05-02 ENCOUNTER — Telehealth: Payer: Self-pay

## 2019-05-02 NOTE — Telephone Encounter (Signed)
Remote health nurse Maudie Mercury [phone] that Mrs Zembower BG has been in the low 50's every morning. Pt is not on any antidiabetic meds.

## 2019-05-02 NOTE — Progress Notes (Signed)
Pre-test Genetic Consultation notes  [name] is here today to discuss proceeding with genetic testing for HCM. She notes no changes in her medical history or that of her relatives. I informed her that her physician would like to have her proceed with genetic testing for HCM to confirm her diagnosis. She is agreeable with this.   Plan Blood was drawn today for genetic testing.   Lattie Corns, Ph.D, [facility] Clinical Molecular Geneticist

## 2019-05-02 NOTE — Telephone Encounter (Signed)
I saw her logs last time she was here, and those readings happen on and off.  She clearly has nutritional deficit and not much of body reserve. I am working her up for adrenal insufficiency.

## 2019-05-03 NOTE — Telephone Encounter (Signed)
Left VM for [name] explaining that Dr nida is currently doing a workup for adrenal insufficiency at this time and to continue to have her eat a snack before bedtime in attempt to keep her BG levels up.

## 2019-05-12 ENCOUNTER — Telehealth: Payer: Self-pay | Admitting: *Deleted

## 2019-05-12 NOTE — Telephone Encounter (Signed)
Received a call from [facility] with preservice center. Patient will require PA for EGD via [place] for plan year 2021. [place] website and unab to do PA until after 05/13/2019.

## 2019-05-16 ENCOUNTER — Other Ambulatory Visit: Payer: Self-pay | Admitting: *Deleted

## 2019-05-16 NOTE — Patient Instructions (Signed)
[name]  05/16/2019     @PREFPERIOPPHARMACY @   Your procedure is scheduled on Thursday,05/19/19.  Report to [facility] at 0700 A.M.  Call this number if you have problems the morning of surgery:  [phone]   Remember:  Do not eat or drink after midnight.     Take these medicines the morning of surgery with A SIP OF WATER amlodipine, abilify, coreg, lexapro, levothyroxine, omeprazole, and zofran if needed.  Use your albuterol and inhaler and bring it with you.     Do not wear jewelry, make-up or nail polish.  Do not wear lotions, powders, or perfumes, or deodorant.  Do not shave 48 hours prior to surgery.  Men may shave face and neck.  Do not bring valuables to the hospital.  [facility] is not responsible for any belongings or valuables.  Contacts, dentures or bridgework may not be worn into surgery.  Leave your suitcase in the car.  After surgery it may be brought to your room.  For patients admitted to the hospital, discharge time will be determined by your treatment team.  Patients discharged the day of surgery will not be allowed to drive home.   Name and phone number of your driver:   Family` Special instructions:  Follow specific diet instruction in letter from the office.  Please read over the following fact sheets that you were given. Anesthesia Post-op Instructions and Care and Recovery After Surgery     Upper Endoscopy, Adult Upper endoscopy is a procedure to look inside the upper GI (gastrointestinal) tract. The upper GI tract is made up of:  The part of the body that moves food from your mouth to your stomach (esophagus).  The stomach.  The first part of your small intestine (duodenum). This procedure is also called esophagogastroduodenoscopy (EGD) or gastroscopy. In this procedure, your health care provider passes a thin, flexible tube (endoscope) through your mouth and down your esophagus into your stomach. A small camera is attached to the end  of the tube. Images from the camera appear on a monitor in the exam room. During this procedure, your health care provider may also remove a small piece of tissue to be sent to a lab and examined under a microscope (biopsy). Your health care provider may do an upper endoscopy to diagnose cancers of the upper GI tract. You may also have this procedure to find the cause of other conditions, such as:  Stomach pain.  Heartburn.  Pain or problems when swallowing.  Nausea and vomiting.  Stomach bleeding.  Stomach ulcers. Tell a health care provider about:  Any allergies you have.  All medicines you are taking, including vitamins, herbs, eye drops, creams, and over-the-counter medicines.  Any problems you or family members have had with anesthetic medicines.  Any blood disorders you have.  Any surgeries you have had.  Any medical conditions you have.  Whether you are pregnant or may be pregnant. What are the risks? Generally, this is a safe procedure. However, problems may occur, including:  Infection.  Bleeding.  Allergic reactions to medicines.  A tear or hole (perforation) in the esophagus, stomach, or duodenum. What happens before the procedure? Staying hydrated Follow instructions from your health care provider about hydration, which may include:  Up to 2 hours before the procedure - you may continue to drink clear liquids, such as water, clear fruit juice, black coffee, and plain tea.  Eating and drinking restrictions Follow instructions from your health care  provider about eating and drinking, which may include:  8 hours before the procedure - stop eating heavy meals or foods, such as meat, fried foods, or fatty foods.  6 hours before the procedure - stop eating light meals or foods, such as toast or cereal.  6 hours before the procedure - stop drinking milk or drinks that contain milk.  2 hours before the procedure - stop drinking clear liquids. Medicines Ask  your health care provider about:  Changing or stopping your regular medicines. This is especially important if you are taking diabetes medicines or blood thinners.  Taking medicines such as aspirin and ibuprofen. These medicines can thin your blood. Do not take these medicines unless your health care provider tells you to take them.  Taking over-the-counter medicines, vitamins, herbs, and supplements. General instructions  Plan to have someone take you home from the hospital or clinic.  If you will be going home right after the procedure, plan to have someone with you for 24 hours.  Ask your health care provider what steps will be taken to help prevent infection. What happens during the procedure?   An IV will be inserted into one of your veins.  You may be given one or more of the following: ? A medicine to help you relax (sedative). ? A medicine to numb the throat (local anesthetic).  You will lie on your left side on an exam table.  Your health care provider will pass the endoscope through your mouth and down your esophagus.  Your health care provider will use the scope to check the inside of your esophagus, stomach, and duodenum. Biopsies may be taken.  The endoscope will be removed. The procedure may vary among health care providers and hospitals. What happens after the procedure?  Your blood pressure, heart rate, breathing rate, and blood oxygen level will be monitored until you leave the hospital or clinic.  Do not drive for 24 hours if you were given a sedative during your procedure.  When your throat is no longer numb, you may be given some fluids to drink.  It is up to you to get the results of your procedure. Ask your health care provider, or the department that is doing the procedure, when your results will be ready. Summary  Upper endoscopy is a procedure to look inside the upper GI tract.  During the procedure, an IV will be inserted into one of your veins.  You may be given a medicine to help you relax.  A medicine will be used to numb your throat.  The endoscope will be passed through your mouth and down your esophagus. This information is not intended to replace advice given to you by your health care provider. Make sure you discuss any questions you have with your health care provider. Document Revised: 10/21/2017 Document Reviewed: 09/28/2017 Elsevier Patient Education  [place].    Esophageal Dilatation Esophageal dilatation, also called esophageal dilation, is a procedure to widen or open (dilate) a blocked or narrowed part of the esophagus. The esophagus is the part of the body that moves food and liquid from the mouth to the stomach. You may need this procedure if:  You have a buildup of scar tissue in your esophagus that makes it difficult, painful, or impossible to swallow. This can be caused by gastroesophageal reflux disease (GERD).  You have cancer of the esophagus.  There is a problem with how food moves through your esophagus. In some cases, you may  need this procedure repeated at a later time to dilate the esophagus gradually. Tell a health care provider about:  Any allergies you have.  All medicines you are taking, including vitamins, herbs, eye drops, creams, and over-the-counter medicines.  Any problems you or family members have had with anesthetic medicines.  Any blood disorders you have.  Any surgeries you have had.  Any medical conditions you have.  Any antibiotic medicines you are required to take before dental procedures.  Whether you are pregnant or may be pregnant. What are the risks? Generally, this is a safe procedure. However, problems may occur, including:  Bleeding due to a tear in the lining of the esophagus.  A hole (perforation) in the esophagus. What happens before the procedure?  Follow instructions from your health care provider about eating or drinking restrictions.  Ask your  health care provider about changing or stopping your regular medicines. This is especially important if you are taking diabetes medicines or blood thinners.  Plan to have someone take you home from the hospital or clinic.  Plan to have a responsible adult care for you for at least 24 hours after you leave the hospital or clinic. This is important. What happens during the procedure?  You may be given a medicine to help you relax (sedative).  A numbing medicine may be sprayed into the back of your throat, or you may gargle the medicine.  Your health care provider may perform the dilatation using various surgical instruments, such as: ? Simple dilators. This instrument is carefully placed in the esophagus to stretch it. ? Guided wire bougies. This involves using an endoscope to insert a wire into the esophagus. A dilator is passed over this wire to enlarge the esophagus. Then the wire is removed. ? Balloon dilators. An endoscope with a small balloon at the end is inserted into the esophagus. The balloon is inflated to stretch the esophagus and open it up. The procedure may vary among health care providers and hospitals. What happens after the procedure?  Your blood pressure, heart rate, breathing rate, and blood oxygen level will be monitored until the medicines you were given have worn off.  Your throat may feel slightly sore and numb. This will improve slowly over time.  You will not be allowed to eat or drink until your throat is no longer numb.  When you are able to drink, urinate, and sit on the edge of the bed without nausea or dizziness, you may be able to return home. Follow these instructions at home:  Take over-the-counter and prescription medicines only as told by your health care provider.  Do not drive for 24 hours if you were given a sedative during your procedure.  You should have a responsible adult with you for 24 hours after the procedure.  Follow instructions from  your health care provider about any eating or drinking restrictions.  Do not use any products that contain nicotine or tobacco, such as cigarettes and e-cigarettes. If you need help quitting, ask your health care provider.  Keep all follow-up visits as told by your health care provider. This is important. Get help right away if you:  Have a fever.  Have chest pain.  Have pain that is not relieved by medication.  Have trouble breathing.  Have trouble swallowing.  Vomit blood. Summary  Esophageal dilatation, also called esophageal dilation, is a procedure to widen or open (dilate) a blocked or narrowed part of the esophagus.  Plan to have someone  take you home from the hospital or clinic.  For this procedure, a numbing medicine may be sprayed into the back of your throat, or you may gargle the medicine.  Do not drive for 24 hours if you were given a sedative during your procedure. This information is not intended to replace advice given to you by your health care provider. Make sure you discuss any questions you have with your health care provider. Document Revised: 02/23/2019 Document Reviewed: 03/03/2017 Elsevier Patient Education  2020 [place].  Upper Endoscopy, Adult, Care After This sheet gives you information about how to care for yourself after your procedure. Your health care provider may also give you more specific instructions. If you have problems or questions, contact your health care provider. What can I expect after the procedure? After the procedure, it is common to have:  A sore throat.  Mild stomach pain or discomfort.  Bloating.  Nausea. Follow these instructions at home:   Follow instructions from your health care provider about what to eat or drink after your procedure.  Return to your normal activities as told by your health care provider. Ask your health care provider what activities are safe for you.  Take over-the-counter and prescription  medicines only as told by your health care provider.  Do not drive for 24 hours if you were given a sedative during your procedure.  Keep all follow-up visits as told by your health care provider. This is important. Contact a health care provider if you have:  A sore throat that lasts longer than one day.  Trouble swallowing. Get help right away if:  You vomit blood or your vomit looks like coffee grounds.  You have: ? A fever. ? Bloody, black, or tarry stools. ? A severe sore throat or you cannot swallow. ? Difficulty breathing. ? Severe pain in your chest or abdomen. Summary  After the procedure, it is common to have a sore throat, mild stomach discomfort, bloating, and nausea.  Do not drive for 24 hours if you were given a sedative during the procedure.  Follow instructions from your health care provider about what to eat or drink after your procedure.  Return to your normal activities as told by your health care provider. This information is not intended to replace advice given to you by your health care provider. Make sure you discuss any questions you have with your health care provider. Document Revised: 10/20/2017 Document Reviewed: 09/28/2017 Elsevier Patient Education  [place].

## 2019-05-16 NOTE — Telephone Encounter (Signed)
PA approved via [facility] website for egd/dil. Auth# [id number] dates 05/19/2019-08/17/2019

## 2019-05-17 ENCOUNTER — Encounter (HOSPITAL_COMMUNITY): Payer: Self-pay

## 2019-05-17 ENCOUNTER — Other Ambulatory Visit (HOSPITAL_COMMUNITY)
Admission: RE | Admit: 2019-05-17 | Discharge: 2019-05-17 | Disposition: A | Discharge status: Home / Self Care | Payer: Medicare Other | Source: Ambulatory Visit | Attending: Internal Medicine | Admitting: Internal Medicine

## 2019-05-17 ENCOUNTER — Encounter (HOSPITAL_COMMUNITY)
Admission: RE | Admit: 2019-05-17 | Discharge: 2019-05-17 | Disposition: A | Discharge status: Home / Self Care | Payer: Medicare Other | Source: Ambulatory Visit | Attending: Internal Medicine | Admitting: Internal Medicine

## 2019-05-17 ENCOUNTER — Other Ambulatory Visit: Payer: Self-pay

## 2019-05-17 DIAGNOSIS — Z7982 Long term (current) use of aspirin: Secondary | ICD-10-CM | POA: Diagnosis not present

## 2019-05-17 DIAGNOSIS — E119 Type 2 diabetes mellitus without complications: Secondary | ICD-10-CM | POA: Diagnosis not present

## 2019-05-17 DIAGNOSIS — Z01812 Encounter for preprocedural laboratory examination: Secondary | ICD-10-CM | POA: Diagnosis present

## 2019-05-17 DIAGNOSIS — I10 Essential (primary) hypertension: Secondary | ICD-10-CM | POA: Insufficient documentation

## 2019-05-17 DIAGNOSIS — R1310 Dysphagia, unspecified: Secondary | ICD-10-CM | POA: Insufficient documentation

## 2019-05-17 DIAGNOSIS — Z79899 Other long term (current) drug therapy: Secondary | ICD-10-CM | POA: Diagnosis not present

## 2019-05-17 DIAGNOSIS — G4733 Obstructive sleep apnea (adult) (pediatric): Secondary | ICD-10-CM | POA: Diagnosis not present

## 2019-05-17 DIAGNOSIS — K219 Gastro-esophageal reflux disease without esophagitis: Secondary | ICD-10-CM | POA: Insufficient documentation

## 2019-05-17 DIAGNOSIS — J449 Chronic obstructive pulmonary disease, unspecified: Secondary | ICD-10-CM | POA: Insufficient documentation

## 2019-05-17 DIAGNOSIS — Z87891 Personal history of nicotine dependence: Secondary | ICD-10-CM | POA: Insufficient documentation

## 2019-05-17 DIAGNOSIS — E039 Hypothyroidism, unspecified: Secondary | ICD-10-CM | POA: Insufficient documentation

## 2019-05-17 DIAGNOSIS — Z9981 Dependence on supplemental oxygen: Secondary | ICD-10-CM | POA: Diagnosis not present

## 2019-05-17 HISTORY — DX: Unspecified glaucoma: H40.9

## 2019-05-17 LAB — CBC
HCT: 30.7 % — ABNORMAL LOW (ref 36.0–46.0)
Hemoglobin: 10 g/dL — ABNORMAL LOW (ref 12.0–15.0)
MCH: 27.9 pg (ref 26.0–34.0)
MCHC: 32.6 g/dL (ref 30.0–36.0)
MCV: 85.5 fL (ref 80.0–100.0)
Platelets: 377 10*3/uL (ref 150–400)
RBC: 3.59 MIL/uL — ABNORMAL LOW (ref 3.87–5.11)
RDW: 17 % — ABNORMAL HIGH (ref 11.5–15.5)
WBC: 3.4 10*3/uL — ABNORMAL LOW (ref 4.0–10.5)
nRBC: 0 % (ref 0.0–0.2)

## 2019-05-17 LAB — BASIC METABOLIC PANEL
Anion gap: 7 (ref 5–15)
BUN: 13 mg/dL (ref 8–23)
CO2: 25 mmol/L (ref 22–32)
Calcium: 8.1 mg/dL — ABNORMAL LOW (ref 8.9–10.3)
Chloride: 104 mmol/L (ref 98–111)
Creatinine, Ser: 0.64 mg/dL (ref 0.44–1.00)
GFR calc Af Amer: >60 mL/min (ref >60)
GFR calc non Af Amer: >60 mL/min (ref >60)
Glucose, Bld: 79 mg/dL (ref 70–99)
Potassium: 3.1 mmol/L — ABNORMAL LOW (ref 3.5–5.1)
Sodium: 136 mmol/L (ref 135–145)

## 2019-05-17 NOTE — Progress Notes (Signed)
Potassium low at 3.1.  Patient supposed be taking potassium chloride 40 mEq daily.

## 2019-05-18 ENCOUNTER — Encounter: Payer: Self-pay | Admitting: Family Medicine

## 2019-05-18 ENCOUNTER — Other Ambulatory Visit (HOSPITAL_COMMUNITY)
Admission: RE | Admit: 2019-05-18 | Discharge: 2019-05-18 | Disposition: A | Discharge status: Home / Self Care | Payer: Medicare Other | Source: Ambulatory Visit | Attending: Internal Medicine | Admitting: Internal Medicine

## 2019-05-18 ENCOUNTER — Telehealth: Payer: Self-pay

## 2019-05-18 ENCOUNTER — Ambulatory Visit (INDEPENDENT_AMBULATORY_CARE_PROVIDER_SITE_OTHER): Payer: Medicare Other | Admitting: Family Medicine

## 2019-05-18 ENCOUNTER — Ambulatory Visit: Payer: Medicare Other | Admitting: Pulmonary Disease

## 2019-05-18 ENCOUNTER — Other Ambulatory Visit: Payer: Medicare Other

## 2019-05-18 DIAGNOSIS — Z0000 Encounter for general adult medical examination without abnormal findings: Secondary | ICD-10-CM | POA: Diagnosis not present

## 2019-05-18 LAB — SARS CORONAVIRUS 2 (TAT 6-24 HRS): SARS Coronavirus 2: NEGATIVE

## 2019-05-18 NOTE — Patient Instructions (Addendum)
Call for psy referral when you are ready Complete labwork as per Dr. Dorris Fetch to evaluate the Diabetes Keep appointments already set up for evaluation Keep EGD appointment tomorrow We will send you a copy of living will for evaluation and to complete

## 2019-05-18 NOTE — Progress Notes (Signed)
Subjective:    [name] is a 69 y.o. female who presents for Medicare Initial preventive examination.  Preventive Screening-Counseling & Management  Tobacco Social History   Tobacco Use  Smoking Status Former Smoker  . Packs/day: 0.20  . Years: 4.00  . Pack years: 0.80  . Types: Cigarettes  . Quit date: 05/13/1995  . Years since quitting: 24.0  Smokeless Tobacco Never Used     Problems Prior to Visit GI  [name] is a 69 y.o. female here for further evaluation of dysphagia.  BPE recently demonstrated some fixed narrowing of the distal esophagus with some irregularity.  Barium pill however passed through this area without delay.  Patient does clinically relate esophageal dysphagia to solids and pills.  EGD last year in [place] demonstrated no significant findings.  Biopsies for H. pylori results unavailable at this time.  Patient is slated for EGD. Cording to low by our GI office notes, patient was treated for H. pylori.  Apparently, follow-up stool antigen has not been done as of yet.  with dilation on May 19, 2019. Other than a baby aspirin, she is not anticoagulated. She takes omeprazole 40 mg twice daily for management of GERD Cardio. 1. HCM - the patch has moderate increased risk. She has patchy LGE and NSVT on her cardiac monitor. She has not had syncope. The patient has class 2 dyspnea. 2. NSVT - she is asymptomatic. She has minimal arrhythmia on her monitor. 3. Co-morbidities - The patient's weight loss is concerning to me as the long term treatment is limited. Ultimately I think the risk/benefit/ration in light of her comorbidities, particularly the weight loss make any possible benefit of a secondary prevention ICD minimal. If her collagen vascular disease were to abate and the weight loss, resolve, I would be willing to reconsider her treatment options. To that end I will plan to see her back in 6 months. If the patient were to have VT or malignant  arrhythmias, then ICD insertion if a secondary treatment would be reconsidered  Genetics: In summary, [name] presents with moderate concentric hypertrophy. Her presentation is likely confounded by her hypertension and aortic valve calcification. Additionally, very little information could be obtained today from [place] regarding her family history. Thus, considering her late age of presentation in the background of risk factors of HTN and aortic valve calcification as well as no significant family history of HCM or sudden death, it is unlikely that she has a genetic condition. I informed her that we can pursue genetic testing later, if a first-degree relative presents with symptoms or is diagnosed with HCM. She verbalized understanding of this.Please note that the patient has not been counseled in this visit on personal, cultural or ethical issues that she may face due to her heart condition  Endocrinology  1. Hypothyroidism, unspecified type 2. Type 2 diabetes mellitus without complication, without long-term current use of insulin (HCC) - [name] has currently controlled asymptomatic diabetes with recent A1c of 5.5%.  -She will not require medication intervention for diabetes at this time.  Patient with apparent nutritional deficiency with relatively rapid unintended weight loss.  -She has psychosocial issues which interfere with her care, with no or nonexistent social support.  -I discussed with her to be more generous in taking carbs including pasta, rice, ground meat, fruits. -She would benefit from evaluation by social services, for possible arrangement for home aide. -She will have a new set of thyroid function tests including TSH, free T4,  as well as a.m. cortisol to evaluate for adrenal sufficiency in the next few days, and will be contacted if findings are abnormal.Regarding her hypothyroidism, her recent thyroid function tests are consistent with under replacement.  However,  she has been taking her levothyroxine randomly.-75 mcg may be appropriate for her current weight, she is advised to continue levothyroxine 75 mcg p.o. every morning. - We discussed about the correct intake of her thyroid hormone, on empty stomach at fasting, with water, separated by at least 30 minutes from breakfast and other medications,  and separated by more than 4 hours from calcium, iron, multivitamins, acid reflux medications (PPIs). -Patient is made aware of the fact that thyroid hormone replacement is needed for life, dose to be adjusted by periodic monitoring of thyroid function tests. Multinodular goiter: She has a documented, 2.2 cm suspicious nodule on her right lobe.  She will need fine-needle aspiration to rule out malignancy.  This will be scheduled to be done as soon as possible in [place] up plan: - Return in about 3 months (around 04/27/2019) for Bring Meter and Logs- A1c in Office.  Rheumatology She has been treated for chronic pericarditis for a few months. She has had continuous chest pain despite colchicine therapy. We added prednisone and symptoms resolved.The cause of pericarditis is unknown. She has throbbing pain and stiffness in finger joints and in feet. Symptoms for years. She has had both knees replaced. She walks with a walker.Assessment 1. Chronic pericarditis, unspecified complication status, unspecified type CBC and Differential  Comprehensive Metabolic Panel Sedimentation Rate (ESR) 2. Pain in joint, multiple sites  OrdersDecrease prednisone to 5 mg a day RTO 3 months.   Current Problems (verified) Patient Active Problem List   Diagnosis Date Noted  . Loss of weight 04/28/2019  . Paroxysmal atrial fibrillation ([place]) 03/01/2019  . Multinodular goiter 01/26/2019  . Regurgitation of food 01/21/2019  . Calculus of gallbladder without cholecystitis without obstruction 01/21/2019  . Numbness and tingling in both hands 12/25/2018  . Chronic  cholecystitis with calculus 12/16/2018  . Abnormal LFTs 12/16/2018  . Abnormal weight loss 12/16/2018  . Upper abdominal pain   . Elevated transaminase level   . Hepatic cirrhosis ([place])   . Nausea and vomiting in adult   . Acute cholecystitis 12/09/2018  . Pyrosis 03/12/2018  . History of Helicobacter pylori infection 02/01/2018  . Dysphagia 02/01/2018  . Anxiety and depression   . Hypothyroidism 12/22/2017  . ILD (interstitial lung disease) ([place]) 11/25/2017  . Chronic respiratory failure with hypoxia ([place]) 11/12/2017  . Pericardial effusion   . Paresthesia 10/19/2017  . Neck pain 10/19/2017  . PCP NOTES >>> 02/23/2015  . Nocturnal oxygen desaturation 01/02/2015  . Morbid obesity ([place]) 01/02/2015  . Asthma, chronic 01/02/2015  . Gastroesophageal reflux disease 07/25/2013  . DOE (dyspnea on exertion) 04/25/2013  . Bronchospasm 05/28/2012  . Internal hemorrhoids without mention of complication 80/99/8338  . Screening breast examination 06/06/2011  . Anemia 12/10/2009  . SKIN LESION 08/06/2009  . Hyperlipidemia 01/11/2009  . Depression 09/26/2008  . INSOMNIA 09/26/2008  . OBSTRUCTIVE SLEEP APNEA 06/23/2008  . Chest pain 11/18/2007  . Type 2 diabetes mellitus without complication, without long-term current use of insulin ([place]) 10/06/2006  . Essential hypertension 10/06/2006  . Osteoarthritis 10/06/2006    Medications Prior to Visit Current Outpatient Medications on File Prior to Visit  Medication Sig Dispense Refill  . Accu-Chek Softclix Lancets lancets USE TO CHECK BLOOD SUGAR TWICE A DAY 200 each  12  . albuterol (VENTOLIN HFA) 108 (90 Base) MCG/ACT inhaler Inhale 2 puffs into the lungs every 4 (four) hours as needed for wheezing or shortness of breath. 18 g 5  . alum & mag hydroxide-simeth (MAALOX/MYLANTA) 200-200-20 MG/5ML suspension Take 30 mLs by mouth every 4 (four) hours as needed for indigestion. 355 mL 0  . amLODipine (NORVASC) 10 MG tablet Take 1 tablet (10 mg  total) by mouth daily. 90 tablet 3  . ARIPiprazole (ABILIFY) 5 MG tablet Take 1 tablet by mouth daily.    Marland Kitchen aspirin EC 81 MG tablet Take 1 tablet (81 mg total) by mouth daily. 90 tablet 3  . carvedilol (COREG) 25 MG tablet Take 1 tablet (25 mg total) by mouth 2 (two) times daily. (Patient taking differently: Take 25 mg by mouth daily. ) 60 tablet 11  . escitalopram (LEXAPRO) 20 MG tablet TAKE 1 TABLET BY MOUTH  DAILY (Patient taking differently: Take 20 mg by mouth daily. ) 90 tablet 3  . Fluticasone-Salmeterol (WIXELA INHUB) 250-50 MCG/DOSE AEPB Inhale 1 puff into the lungs 2 (two) times daily.    Marland Kitchen glucose blood (ACCU-CHEK AVIVA PLUS) test strip TEST BLOOD SUGAR TWICE DAILY AND LANCETS TWICE DAILY E11.9 200 each 11  . latanoprost (XALATAN) 0.005 % ophthalmic solution Place 1 drop into both eyes at bedtime.    Marland Kitchen levothyroxine (SYNTHROID) 88 MCG tablet Take 1 tablet (88 mcg total) by mouth daily before breakfast. 90 tablet 1  . omeprazole (PRILOSEC) 40 MG capsule Take 1 capsule (40 mg total) by mouth 2 (two) times daily before lunch and supper. 60 capsule 5  . ondansetron (ZOFRAN) 4 MG tablet Take 1 tablet (4 mg total) by mouth every 6 (six) hours as needed for nausea. 20 tablet 0  . OVER THE COUNTER MEDICATION CPAP: At bedtime    . OXYGEN Inhale 2 L into the lungs daily as needed (shortness of breath).     . potassium chloride SA (K-DUR) 20 MEQ tablet Take 1 tablet (20 mEq total) by mouth 2 (two) times daily. 180 tablet 1  . rosuvastatin (CRESTOR) 40 MG tablet TAKE 1 TABLET BY MOUTH IN  THE EVENING (Patient taking differently: Take 40 mg by mouth at bedtime. ) 90 tablet 3   No current facility-administered medications on file prior to visit.    Current Medications (verified) Current Outpatient Medications  Medication Sig Dispense Refill  . Accu-Chek Softclix Lancets lancets USE TO CHECK BLOOD SUGAR TWICE A DAY 200 each 12  . albuterol (VENTOLIN HFA) 108 (90 Base) MCG/ACT inhaler Inhale 2  puffs into the lungs every 4 (four) hours as needed for wheezing or shortness of breath. 18 g 5  . alum & mag hydroxide-simeth (MAALOX/MYLANTA) 200-200-20 MG/5ML suspension Take 30 mLs by mouth every 4 (four) hours as needed for indigestion. 355 mL 0  . amLODipine (NORVASC) 10 MG tablet Take 1 tablet (10 mg total) by mouth daily. 90 tablet 3  . ARIPiprazole (ABILIFY) 5 MG tablet Take 1 tablet by mouth daily.    Marland Kitchen aspirin EC 81 MG tablet Take 1 tablet (81 mg total) by mouth daily. 90 tablet 3  . carvedilol (COREG) 25 MG tablet Take 1 tablet (25 mg total) by mouth 2 (two) times daily. (Patient taking differently: Take 25 mg by mouth daily. ) 60 tablet 11  . escitalopram (LEXAPRO) 20 MG tablet TAKE 1 TABLET BY MOUTH  DAILY (Patient taking differently: Take 20 mg by mouth daily. ) 90 tablet 3  .  Fluticasone-Salmeterol (WIXELA INHUB) 250-50 MCG/DOSE AEPB Inhale 1 puff into the lungs 2 (two) times daily.    Marland Kitchen glucose blood (ACCU-CHEK AVIVA PLUS) test strip TEST BLOOD SUGAR TWICE DAILY AND LANCETS TWICE DAILY E11.9 200 each 11  . latanoprost (XALATAN) 0.005 % ophthalmic solution Place 1 drop into both eyes at bedtime.    Marland Kitchen levothyroxine (SYNTHROID) 88 MCG tablet Take 1 tablet (88 mcg total) by mouth daily before breakfast. 90 tablet 1  . omeprazole (PRILOSEC) 40 MG capsule Take 1 capsule (40 mg total) by mouth 2 (two) times daily before lunch and supper. 60 capsule 5  . ondansetron (ZOFRAN) 4 MG tablet Take 1 tablet (4 mg total) by mouth every 6 (six) hours as needed for nausea. 20 tablet 0  . OVER THE COUNTER MEDICATION CPAP: At bedtime    . OXYGEN Inhale 2 L into the lungs daily as needed (shortness of breath).     . potassium chloride SA (K-DUR) 20 MEQ tablet Take 1 tablet (20 mEq total) by mouth 2 (two) times daily. 180 tablet 1  . rosuvastatin (CRESTOR) 40 MG tablet TAKE 1 TABLET BY MOUTH IN  THE EVENING (Patient taking differently: Take 40 mg by mouth at bedtime. ) 90 tablet 3   No current  facility-administered medications for this visit.     Allergies (verified) Patient has no known allergies.   PAST HISTORY  Family History Family History  Problem Relation Age of Onset  . Asthma Mother   . Stroke Mother   . Diabetes Mother   . Diabetes Other        M, B, S  . Hypertension Sister        M, S,B  . Pancreatic cancer Brother   . Colon cancer Neg Hx   . Prostate cancer Neg Hx   . Breast cancer Neg Hx   . Esophageal cancer Neg Hx   . Liver disease Neg Hx   . Rectal cancer Neg Hx   . Stomach cancer Neg Hx   . Inflammatory bowel disease Neg Hx     Social History Social History   Tobacco Use  . Smoking status: Former Smoker    Packs/day: 0.20    Years: 4.00    Pack years: 0.80    Types: Cigarettes    Quit date: 05/13/1995    Years since quitting: 24.0  . Smokeless tobacco: Never Used  Substance Use Topics  . Alcohol use: Not Currently     Are there smokers in your home (other than you)? no  Risk Factors Current exercise habits: uses walker Dietary issues discussed: 3 meals/day-reflux-EGD tomorrow-prilosec daily  Cardiac risk factors:  Normal cath-DM/HTN Depression Screen (Note: if answer to either of the following is "Yes", a more complete depression screening is indicated)   Over the past 2 weeks, have you felt down, depressed or hopeless? yes  Over the past 2 weeks, have you felt little interest or pleasure in doing things? yes  Have you lost interest or pleasure in daily life? yes  Do you often feel hopeless? yes  Do you cry easily over simple problems? yes Activities of Daily Living In your present state of health, do you have any difficulty performing the following activities?:  Driving? Never drove Managing money?  Pt pay own bills-cousins takes pt to pay bills Feeding yourself? yes Getting from bed to chair?yes-uses walker Climbing a flight of stairs? no Preparing food and eating?: feed self- assistance with  Fixing  food Bathing or  showering? Assistance with showering Getting dressed: needs assistance Getting to the toilet? Uses walker Using the toilet:regular bathroom, no potty chair Moving around from place to place: using walker In the past year have you fallen or had a near fall? [place] 2 months ago   Are you sexually active? 2013 last partner  Do you have more than one partner? None currently  Hearing Difficulties: no concerns Do you often ask people to speak up or repeat themselves?speak up Do you experience ringing or noises in your ears? Ringing in both ears Do you have difficulty understanding soft or whispered voices? Difficulty with soft voices   Do you feel that you have a problem with memory? No   Do you often misplace items? Yes sometimes  Do you feel safe at home?  safe  Cognitive Testing  Alert? yes  Normal Appearance?yes-uses walker  Oriented to person?yes Place? yes  Time?yes  Recall of three objects?  Yes 2/3  Can perform simple calculations? yes  Displays appropriate judgment?yes  Can read the correct time from a watch face?yes   Advanced Directives have been discussed with the patient?yes  List the Names of Other Physician/Practitioners you currently use: 1.  Dr. Gershon Cull 2.Dr. Joseph-genetics 3.Dr. Consuella Lose 4. Dr Dapp/Taylor-cardiology 5. Dr. Z-rheumatology 6. Dr. Tyrone Schimke specialist 7. Dr. Fernanda Drum 8. Dr. Grace Isaac 1/14  Indicate any recent Medical Services you may have received from other than [facility] providers in the past year (date may be approximate). Rheumatology-[facility] 02/08/29 Eye specialist-Dr. Caryl Bis unknown Immunization History  Administered Date(s) Administered  . Influenza Whole 05/12/2006, 03/15/2007  . Influenza, High Dose Seasonal PF 02/22/2015, 02/25/2016, 03/31/2017, 02/24/2018  . Influenza, Seasonal, Injecte, Preservative Fre 05/28/2012  . Influenza,inj,Quad PF,6+ Mos 05/12/2012, 04/25/2013, 06/01/2014  . Pneumococcal Conjugate-13  10/26/2013  . Pneumococcal Polysaccharide-23 05/28/2012  . Td 08/17/2006, 03/31/2017  . Zoster 10/29/2012  Screening Tests Health Maintenance  Topic Date Due  . PNA vac Low Risk Adult (2 of 2 - PPSV23) 05/28/2017  . MAMMOGRAM  10/29/2017  . COLONOSCOPY  08/01/2018  . INFLUENZA VACCINE  12/11/2018  . FOOT EXAM  06/02/2019  . HEMOGLOBIN A1C  10/27/2019  . OPHTHALMOLOGY EXAM  03/23/2020  . TETANUS/TDAP  04/01/2027  . DEXA SCAN  Completed  . Hepatitis C Screening  Completed   BP 100/70 (BP Location: Right Arm, Patient Position: Sitting, Cuff Size: Normal)  Pulse 85  Temp 97.9 F (36.6 C) (Oral)  Ht _0  (1.753 m)  Wt 154 lb 9.6 oz (70.1 kg)  SpO2 98%  BMI 22.83 kg/m  BSA 1.85 m  PHQ9-10 Vision exam 20/50, 20/50 bilat 20/40 Mertha Baars, MD   05/18/2019      Assessment:   Pt with no living will-will send form to complete     Plan:    pt declines psy evaluation During the course of the visit the patient was educated and counseled about appropriate screening and preventive services including: colonoscopy-pt sees GI for endo tomorrow Diet review for nutrition referral? No -pt sees endo Patient Instructions (the written plan) was sent to the patient.  Medicare Attestation I have personally reviewed: The patient's medical and social history Their use of alcohol, tobacco or illicit drugs Their current medications and supplements The patient's functional ability including ADLs,fall risks, home safety risks, cognitive, and hearing and visual impairment Diet and physical activities Evidence for depression or mood disorders  The patient's weight, height, BMI, and visual acuity have been recorded in the chart.  I have made  referrals, counseling, and provided education to the patient based on review of the above and I have provided the patient with a written personalized care plan for preventive services.     Mertha Baars, MD   05/18/2019

## 2019-05-18 NOTE — Telephone Encounter (Signed)
Melanie at endo called office, pt was no show for COVID test yesterday.   Called pt, she wasn't able to go for COVID test yesterday. States her cousin made her appt for test today. Appt scheduled for today at 9:45am per appt desk. Informed pt to make sure she tells them she is having a procedure so they don't do community test or she will not receive results in time. Verbalized understanding.   Called and informed Threasa Beards. Pt was scheduled for community test not procedure test. I rescheduled appt to today at 10:00am for procedure test.

## 2019-05-19 ENCOUNTER — Encounter (HOSPITAL_COMMUNITY)
Admission: RE | Disposition: A | Discharge status: Home / Self Care | Payer: Self-pay | Source: Home / Self Care | Attending: Internal Medicine

## 2019-05-19 ENCOUNTER — Ambulatory Visit (HOSPITAL_COMMUNITY): Payer: Medicare Other | Admitting: Anesthesiology

## 2019-05-19 ENCOUNTER — Ambulatory Visit (HOSPITAL_COMMUNITY)
Admission: RE | Admit: 2019-05-19 | Discharge: 2019-05-19 | Disposition: A | Discharge status: Home / Self Care | Payer: Medicare Other | Attending: Internal Medicine | Admitting: Internal Medicine

## 2019-05-19 DIAGNOSIS — I10 Essential (primary) hypertension: Secondary | ICD-10-CM | POA: Diagnosis not present

## 2019-05-19 DIAGNOSIS — Z87891 Personal history of nicotine dependence: Secondary | ICD-10-CM | POA: Insufficient documentation

## 2019-05-19 DIAGNOSIS — Z9981 Dependence on supplemental oxygen: Secondary | ICD-10-CM | POA: Diagnosis not present

## 2019-05-19 DIAGNOSIS — J449 Chronic obstructive pulmonary disease, unspecified: Secondary | ICD-10-CM | POA: Diagnosis not present

## 2019-05-19 DIAGNOSIS — E785 Hyperlipidemia, unspecified: Secondary | ICD-10-CM | POA: Insufficient documentation

## 2019-05-19 DIAGNOSIS — Z96653 Presence of artificial knee joint, bilateral: Secondary | ICD-10-CM | POA: Insufficient documentation

## 2019-05-19 DIAGNOSIS — K2100 Gastro-esophageal reflux disease with esophagitis, without bleeding: Secondary | ICD-10-CM | POA: Diagnosis not present

## 2019-05-19 DIAGNOSIS — K2950 Unspecified chronic gastritis without bleeding: Secondary | ICD-10-CM | POA: Diagnosis not present

## 2019-05-19 DIAGNOSIS — Z79890 Hormone replacement therapy (postmenopausal): Secondary | ICD-10-CM | POA: Insufficient documentation

## 2019-05-19 DIAGNOSIS — E119 Type 2 diabetes mellitus without complications: Secondary | ICD-10-CM | POA: Insufficient documentation

## 2019-05-19 DIAGNOSIS — K209 Esophagitis, unspecified without bleeding: Secondary | ICD-10-CM | POA: Diagnosis not present

## 2019-05-19 DIAGNOSIS — R1310 Dysphagia, unspecified: Secondary | ICD-10-CM | POA: Diagnosis present

## 2019-05-19 DIAGNOSIS — Z7951 Long term (current) use of inhaled steroids: Secondary | ICD-10-CM | POA: Insufficient documentation

## 2019-05-19 DIAGNOSIS — F419 Anxiety disorder, unspecified: Secondary | ICD-10-CM | POA: Diagnosis not present

## 2019-05-19 DIAGNOSIS — G4733 Obstructive sleep apnea (adult) (pediatric): Secondary | ICD-10-CM | POA: Insufficient documentation

## 2019-05-19 DIAGNOSIS — Z79899 Other long term (current) drug therapy: Secondary | ICD-10-CM | POA: Diagnosis not present

## 2019-05-19 DIAGNOSIS — F329 Major depressive disorder, single episode, unspecified: Secondary | ICD-10-CM | POA: Diagnosis not present

## 2019-05-19 DIAGNOSIS — E039 Hypothyroidism, unspecified: Secondary | ICD-10-CM | POA: Diagnosis not present

## 2019-05-19 DIAGNOSIS — K449 Diaphragmatic hernia without obstruction or gangrene: Secondary | ICD-10-CM | POA: Diagnosis not present

## 2019-05-19 DIAGNOSIS — Z7982 Long term (current) use of aspirin: Secondary | ICD-10-CM | POA: Insufficient documentation

## 2019-05-19 HISTORY — PX: ESOPHAGOGASTRODUODENOSCOPY (EGD) WITH PROPOFOL: SHX5813

## 2019-05-19 HISTORY — PX: BIOPSY: SHX5522

## 2019-05-19 LAB — GLUCOSE, CAPILLARY
Glucose-Capillary: 68 mg/dL — ABNORMAL LOW (ref 70–99)
Glucose-Capillary: 87 mg/dL (ref 70–99)

## 2019-05-19 SURGERY — ESOPHAGOGASTRODUODENOSCOPY (EGD) WITH PROPOFOL
Anesthesia: General

## 2019-05-19 MED ORDER — PROPOFOL 500 MG/50ML IV EMUL
INTRAVENOUS | Status: DC | PRN
  Administered 2019-05-19: 150 ug/kg/min via INTRAVENOUS

## 2019-05-19 MED ORDER — PROPOFOL 10 MG/ML IV BOLUS
INTRAVENOUS | Status: DC | PRN
  Administered 2019-05-19: 40 mg via INTRAVENOUS

## 2019-05-19 MED ORDER — DEXTROSE 50 % IV SOLN
INTRAVENOUS | Status: AC
  Filled 2019-05-19: qty 50

## 2019-05-19 MED ORDER — STERILE WATER FOR IRRIGATION IR SOLN
Status: DC | PRN
  Administered 2019-05-19: 1.5 mL

## 2019-05-19 MED ORDER — LACTATED RINGERS IV SOLN: INTRAVENOUS | Status: DC | PRN

## 2019-05-19 NOTE — Anesthesia Procedure Notes (Signed)
Date/Time: 05/19/2019 9:09 AM Performed by: Vista Deck, CRNA Pre-anesthesia Checklist: Patient identified, Emergency Drugs available, Suction available, Timeout performed and Patient being monitored Patient Re-evaluated:Patient Re-evaluated prior to induction Oxygen Delivery Method: Nasal Cannula

## 2019-05-19 NOTE — Transfer of Care (Signed)
Immediate Anesthesia Transfer of Care Note  Patient: [name]  Procedure(s) Performed: ESOPHAGOGASTRODUODENOSCOPY (EGD) WITH PROPOFOL (N/A ) BIOPSY  Patient Location: PACU  Anesthesia Type:General  Level of Consciousness: awake, alert  and patient cooperative  Airway & Oxygen Therapy: Patient Spontanous Breathing  Post-op Assessment: Report given to RN and Post -op Vital signs reviewed and stable  Post vital signs: Reviewed and stable  Last Vitals:  Vitals Value Taken Time  BP    Temp    Pulse 80 05/19/19 0930  Resp 25 05/19/19 0930  SpO2 97 % 05/19/19 0930  Vitals shown include unvalidated device data.  Last Pain:  Vitals:   05/19/19 0910  TempSrc:   PainSc: 0-No pain         Complications: No apparent anesthesia complications

## 2019-05-19 NOTE — Op Note (Signed)
[facility] Patient Name: [name] Procedure Date: 05/19/2019 8:41 AM MRN: [id number] Date of Birth: Jul 04, 1950 Attending MD: Norvel Richards , MD CSN: [id number] Age: 69 Admit Type: Outpatient Procedure:                Upper GI endoscopy Indications:              Dysphagia Providers:                Norvel Richards, MD, Charlsie Quest. Theda Sers RN, RN,                            Randa Spike, Technician Referring MD:              Medicines:                Propofol per Anesthesia Complications:            No immediate complications. Estimated Blood Loss:     Estimated blood loss was minimal. Procedure:                Pre-Anesthesia Assessment:                           - Prior to the procedure, a History and Physical                            was performed, and patient medications and                            allergies were reviewed. The patient's tolerance of                            previous anesthesia was also reviewed. The risks                            and benefits of the procedure and the sedation                            options and risks were discussed with the patient.                            All questions were answered, and informed consent                            was obtained. Prior Anticoagulants: The patient has                            taken no previous anticoagulant or antiplatelet                            agents. ASA Grade Assessment: III - A patient with                            severe systemic disease. After reviewing the risks  and benefits, the patient was deemed in                            satisfactory condition to undergo the procedure.                           After obtaining informed consent, the endoscope was                            passed under direct vision. Throughout the                            procedure, the patient's blood pressure, pulse, and                            oxygen  saturations were monitored continuously. The                            [id number] ID:[id number]) scope was introduced through the                            mouth, and advanced to the second part of duodenum.                            The upper GI endoscopy was accomplished without                            difficulty. The patient tolerated the procedure                            well. Scope In: 9:17:03 AM Scope Out: 9:23:25 AM Total Procedure Duration: 0 hours 6 minutes 22 seconds  Findings:      Esophagitis was found. Severe. Geographic ulcerations/erosions extending       circumferentially from the EG junction halfway up the tubular esophagus.       GE junction patulous. No tumor. No obvious Barrett's epithelium seen.      A medium-sized hiatal hernia was present. Scattered gastric erosions. No       ulcer or infiltrating process. Biopsies of the gastric mucosa taken. No       dilation performed. Impression:               -Severe reflux esophagitis.                           - Medium-sized hiatal hernia.                           -Gastric erosions?"status post biopsy patient is                            supposed to be taking omeprazole 40 mg twice daily.. Moderate Sedation:      Moderate (conscious) sedation was personally administered by an       anesthesia professional. The following parameters were monitored: oxygen       saturation, heart rate, blood pressure, respiratory rate,  EKG, adequacy       of pulmonary ventilation, and response to care. Recommendation:           - Patient has a contact number available for                            emergencies. The signs and symptoms of potential                            delayed complications were discussed with the                            patient. Return to normal activities tomorrow.                            Written discharge instructions were provided to the                            patient.                           -  Continue present medications.                           - Await pathology results. Stop omeprazole; begin                            Protonix 40 mg twice daily. Prescription provided. Procedure Code(s):        --- Professional ---                           [phone], Esophagogastroduodenoscopy, flexible,                            transoral; diagnostic, including collection of                            specimen(s) by brushing or washing, when performed                            (separate procedure) Diagnosis Code(s):        --- Professional ---                           K20.90, Esophagitis, unspecified without bleeding                           K44.9, Diaphragmatic hernia without obstruction or                            gangrene                           R13.10, Dysphagia, unspecified CPT copyright 2019 American [employer] Association. All rights reserved. The codes documented in this report are preliminary and upon coder review may  be revised to meet current compliance requirements. Cristopher Estimable. Gala Romney, MD Norvel Richards, MD  05/19/2019 9:34:05 AM This report has been signed electronically. Number of Addenda: 0

## 2019-05-19 NOTE — Discharge Instructions (Signed)
EGD Discharge instructions Please read the instructions outlined below and refer to this sheet in the next few weeks. These discharge instructions provide you with general information on caring for yourself after you leave the hospital. Your doctor may also give you specific instructions. While your treatment has been planned according to the most current medical practices available, unavoidable complications occasionally occur. If you have any problems or questions after discharge, please call your doctor. ACTIVITY  You may resume your regular activity but move at a slower pace for the next 24 hours.   Take frequent rest periods for the next 24 hours.   Walking will help expel (get rid of) the air and reduce the bloated feeling in your abdomen.   No driving for 24 hours (because of the anesthesia (medicine) used during the test).   You may shower.   Do not sign any important legal documents or operate any machinery for 24 hours (because of the anesthesia used during the test).  NUTRITION  Drink plenty of fluids.   You may resume your normal diet.   Begin with a light meal and progress to your normal diet.   Avoid alcoholic beverages for 24 hours or as instructed by your caregiver.  MEDICATIONS  You may resume your normal medications unless your caregiver tells you otherwise.  WHAT YOU CAN EXPECT TODAY  You may experience abdominal discomfort such as a feeling of fullness or "gas" pains.  FOLLOW-UP  Your doctor will discuss the results of your test with you.  SEEK IMMEDIATE MEDICAL ATTENTION IF ANY OF THE FOLLOWING OCCUR:  Excessive nausea (feeling sick to your stomach) and/or vomiting.   Severe abdominal pain and distention (swelling).   Trouble swallowing.   Temperature over 101 F (37.8 C).   Rectal bleeding or vomiting of blood.   You have severe acid reflux damage in your esophagus.  Your esophagus was wide open and did not need to be stretched  GERD information  provided  Stop omeprazole  Begin Protonix 40 mg orally twice daily prescription provided  Further recommendations to follow pending review of pathology report from today  Office visit with [place] in 3 months     Gastroesophageal Reflux Disease, Adult Gastroesophageal reflux (GER) happens when acid from the stomach flows up into the tube that connects the mouth and the stomach (esophagus). Normally, food travels down the esophagus and stays in the stomach to be digested. With GER, food and stomach acid sometimes move back up into the esophagus. You may have a disease called gastroesophageal reflux disease (GERD) if the reflux:  Happens often.  Causes frequent or very bad symptoms.  Causes problems such as damage to the esophagus. When this happens, the esophagus becomes sore and swollen (inflamed). Over time, GERD can make small holes (ulcers) in the lining of the esophagus. What are the causes? This condition is caused by a problem with the muscle between the esophagus and the stomach. When this muscle is weak or not normal, it does not close properly to keep food and acid from coming back up from the stomach. The muscle can be weak because of:  Tobacco use.  Pregnancy.  Having a certain type of hernia (hiatal hernia).  Alcohol use.  Certain foods and drinks, such as coffee, chocolate, onions, and peppermint. What increases the risk? You are more likely to develop this condition if you:  Are overweight.  Have a disease that affects your connective tissue.  Use NSAID medicines. What are the signs  or symptoms? Symptoms of this condition include:  Heartburn.  Difficult or painful swallowing.  The feeling of having a lump in the throat.  A bitter taste in the mouth.  Bad breath.  Having a lot of saliva.  Having an upset or bloated stomach.  Belching.  Chest pain. Different conditions can cause chest pain. Make sure you see your doctor if you have chest  pain.  Shortness of breath or noisy breathing (wheezing).  Ongoing (chronic) cough or a cough at night.  Wearing away of the surface of teeth (tooth enamel).  Weight loss. How is this treated? Treatment will depend on how bad your symptoms are. Your doctor may suggest:  Changes to your diet.  Medicine.  Surgery. Follow these instructions at home: Eating and drinking   Follow a diet as told by your doctor. You may need to avoid foods and drinks such as: ? Coffee and tea (with or without caffeine). ? Drinks that contain alcohol. ? Energy drinks and sports drinks. ? Bubbly (carbonated) drinks or sodas. ? Chocolate and cocoa. ? Peppermint and mint flavorings. ? Garlic and onions. ? Horseradish. ? Spicy and acidic foods. These include peppers, chili powder, curry powder, vinegar, hot sauces, and BBQ sauce. ? Citrus fruit juices and citrus fruits, such as oranges, lemons, and limes. ? Tomato-based foods. These include red sauce, chili, salsa, and pizza with red sauce. ? Fried and fatty foods. These include donuts, french fries, potato chips, and high-fat dressings. ? High-fat meats. These include hot dogs, rib eye steak, sausage, ham, and bacon. ? High-fat dairy items, such as whole milk, butter, and cream cheese.  Eat small meals often. Avoid eating large meals.  Avoid drinking large amounts of liquid with your meals.  Avoid eating meals during the 2-3 hours before bedtime.  Avoid lying down right after you eat.  Do not exercise right after you eat. Lifestyle   Do not use any products that contain nicotine or tobacco. These include cigarettes, e-cigarettes, and chewing tobacco. If you need help quitting, ask your doctor.  Try to lower your stress. If you need help doing this, ask your doctor.  If you are overweight, lose an amount of weight that is healthy for you. Ask your doctor about a safe weight loss goal. General instructions  Pay attention to any changes in  your symptoms.  Take over-the-counter and prescription medicines only as told by your doctor. Do not take aspirin, ibuprofen, or other NSAIDs unless your doctor says it is okay.  Wear loose clothes. Do not wear anything tight around your waist.  Raise (elevate) the head of your bed about 6 inches (15 cm).  Avoid bending over if this makes your symptoms worse.  Keep all follow-up visits as told by your doctor. This is important. Contact a doctor if:  You have new symptoms.  You lose weight and you do not know why.  You have trouble swallowing or it hurts to swallow.  You have wheezing or a cough that keeps happening.  Your symptoms do not get better with treatment.  You have a hoarse voice. Get help right away if:  You have pain in your arms, neck, jaw, teeth, or back.  You feel sweaty, dizzy, or light-headed.  You have chest pain or shortness of breath.  You throw up (vomit) and your throw-up looks like blood or coffee grounds.  You pass out (faint).  Your poop (stool) is bloody or black.  You cannot swallow, drink, or eat.  Summary  If a person has gastroesophageal reflux disease (GERD), food and stomach acid move back up into the esophagus and cause symptoms or problems such as damage to the esophagus.  Treatment will depend on how bad your symptoms are.  Follow a diet as told by your doctor.  Take all medicines only as told by your doctor. This information is not intended to replace advice given to you by your health care provider. Make sure you discuss any questions you have with your health care provider. Document Revised: 11/04/2017 Document Reviewed: 11/04/2017 Elsevier Patient Education  2020 [place] After These instructions provide you with information about caring for yourself after your procedure. Your health care provider may also give you more specific instructions. Your treatment has been planned according  to current medical practices, but problems sometimes occur. Call your health care provider if you have any problems or questions after your procedure. What can I expect after the procedure? After your procedure, you may:  Feel sleepy for several hours.  Feel clumsy and have poor balance for several hours.  Feel forgetful about what happened after the procedure.  Have poor judgment for several hours.  Feel nauseous or vomit.  Have a sore throat if you had a breathing tube during the procedure. Follow these instructions at home: For at least 24 hours after the procedure:      Have a responsible adult stay with you. It is important to have someone help care for you until you are awake and alert.  Rest as needed.  Do not: ? Participate in activities in which you could fall or become injured. ? Drive. ? Use heavy machinery. ? Drink alcohol. ? Take sleeping pills or medicines that cause drowsiness. ? Make important decisions or sign legal documents. ? Take care of children on your own. Eating and drinking  Follow the diet that is recommended by your health care provider.  If you vomit, drink water, juice, or soup when you can drink without vomiting.  Make sure you have little or no nausea before eating solid foods. General instructions  Take over-the-counter and prescription medicines only as told by your health care provider.  If you have sleep apnea, surgery and certain medicines can increase your risk for breathing problems. Follow instructions from your health care provider about wearing your sleep device: ? Anytime you are sleeping, including during daytime naps. ? While taking prescription pain medicines, sleeping medicines, or medicines that make you drowsy.  If you smoke, do not smoke without supervision.  Keep all follow-up visits as told by your health care provider. This is important. Contact a health care provider if:  You keep feeling nauseous or you keep  vomiting.  You feel light-headed.  You develop a rash.  You have a fever. Get help right away if:  You have trouble breathing. Summary  For several hours after your procedure, you may feel sleepy and have poor judgment.  Have a responsible adult stay with you for at least 24 hours or until you are awake and alert. This information is not intended to replace advice given to you by your health care provider. Make sure you discuss any questions you have with your health care provider. Document Revised: 07/27/2017 Document Reviewed: 08/19/2015 Elsevier Patient Education  [place].

## 2019-05-19 NOTE — Anesthesia Preprocedure Evaluation (Signed)
Anesthesia Evaluation  Patient identified by MRN, date of birth, ID band Patient awake    Reviewed: Allergy & Precautions, NPO status , Patient's Chart, lab work & pertinent test results, reviewed documented beta blocker date and time   Airway Mallampati: II  TM Distance: >3 FB Neck ROM: Full    Dental no notable dental hx. (+) Teeth Intact   Pulmonary shortness of breath, with exertion and Long-Term Oxygen Therapy, asthma , sleep apnea and Oxygen sleep apnea , COPD,  COPD inhaler and oxygen dependent, former smoker,  Reports unable to tolerate CPAP mask    Pulmonary exam normal breath sounds clear to auscultation       Cardiovascular Exercise Tolerance: Poor hypertension, Pt. on medications and Pt. on home beta blockers + DOE  Normal cardiovascular examII Rhythm:Regular Rate:Normal  SR-primary AVB Latest EF ~55-60   Neuro/Psych Anxiety Depression negative neurological ROS  negative psych ROS   GI/Hepatic Neg liver ROS, GERD  ,  Endo/Other  diabetesHypothyroidism   Renal/GU negative Renal ROS  negative genitourinary   Musculoskeletal negative musculoskeletal ROS (+)   Abdominal   Peds negative pediatric ROS (+)  Hematology negative hematology ROS (+)   Anesthesia Other Findings   Reproductive/Obstetrics negative OB ROS                             Anesthesia Physical Anesthesia Plan  ASA: IV  Anesthesia Plan: General   Post-op Pain Management:    Induction: Intravenous  PONV Risk Score and Plan: 3 and Propofol infusion, TIVA, Treatment may vary due to age or medical condition and Ondansetron  Airway Management Planned: Nasal Cannula and Simple Face Mask  Additional Equipment:   Intra-op Plan:   Post-operative Plan:   Informed Consent: I have reviewed the patients History and Physical, chart, labs and discussed the procedure including the risks, benefits and alternatives for  the proposed anesthesia with the patient or authorized representative who has indicated his/her understanding and acceptance.     Dental advisory given  Plan Discussed with: CRNA  Anesthesia Plan Comments: (Plan Full PPE use  Plan GA with GETA as needed d/w pt -WTP with same after Q&A  D/w pt in the unlikely need for ETT , if used, may require postprocedural ventilation -voiced understanding WTP)        Anesthesia Quick Evaluation

## 2019-05-19 NOTE — Anesthesia Postprocedure Evaluation (Signed)
Anesthesia Post Note  Patient: [name]  Procedure(s) Performed: ESOPHAGOGASTRODUODENOSCOPY (EGD) WITH PROPOFOL (N/A ) BIOPSY  Patient location during evaluation: PACU Anesthesia Type: General Level of consciousness: awake and alert and patient cooperative Pain management: satisfactory to patient Vital Signs Assessment: post-procedure vital signs reviewed and stable Respiratory status: spontaneous breathing Cardiovascular status: stable Postop Assessment: no apparent nausea or vomiting Anesthetic complications: no     Last Vitals:  Vitals:   05/19/19 0824 05/19/19 0930  BP: (!) 156/96 92/72  Pulse: (!) 102 80  Resp: 18 (!) 25  Temp: 36.6 C (P) 36.5 C  SpO2: 95% 97%    Last Pain:  Vitals:   05/19/19 0910  TempSrc:   PainSc: 0-No pain                 Vashon Arch

## 2019-05-19 NOTE — Interval H&P Note (Signed)
History and Physical Interval Note:  05/19/2019 8:22 AM  [name]  has presented today for surgery, with the diagnosis of dysphagia, nausea with vomiting, weight loss, further evaluation of abnormalities noted on BPE with need to rule out esophageal tumor.  The various methods of treatment have been discussed with the patient and family. After consideration of risks, benefits and other options for treatment, the patient has consented to  Procedure(s) with comments: ESOPHAGOGASTRODUODENOSCOPY (EGD) WITH PROPOFOL (N/A) - 8:30AM MALONEY DILATION (N/A) as a surgical intervention.  The patient's history has been reviewed, patient examined, no change in status, stable for surgery.  I have reviewed the patient's chart and labs.  Questions were answered to the patient's satisfaction.     [name]  No change.  EGD with esophageal dilation as feasible/appropriate.  Plus plan to biopsy to assess for eradication of H. pylori infection.The risks, benefits, limitations, alternatives and imponderables have been reviewed with the patient. Potential for esophageal dilation, biopsy, etc. have also been reviewed.  Questions have been answered. All parties agreeable.

## 2019-05-19 NOTE — Progress Notes (Signed)
RN checked pt's CBG at 0849 result was 18, Repeat CBG result 24. Pt given Dextrose 50, IV pushed 20 ml. 0902 CBG taken result was 68, IV pushed another 58ml of Dextrose. Pt was alert, talking and coherent the entire time. When she came to PACU  CBG was 87.

## 2019-05-20 ENCOUNTER — Encounter: Payer: Self-pay | Admitting: Internal Medicine

## 2019-05-20 ENCOUNTER — Other Ambulatory Visit: Payer: Self-pay

## 2019-05-20 LAB — SURGICAL PATHOLOGY

## 2019-05-23 LAB — GLUCOSE, CAPILLARY
Glucose-Capillary: 18 mg/dL — CL (ref 70–99)
Glucose-Capillary: 24 mg/dL — CL (ref 70–99)

## 2019-05-24 ENCOUNTER — Encounter (HOSPITAL_COMMUNITY)
Admission: RE | Admit: 2019-05-24 | Discharge: 2019-05-24 | Disposition: A | Discharge status: Home / Self Care | Payer: Medicare Other | Source: Ambulatory Visit | Attending: "Endocrinology | Admitting: "Endocrinology

## 2019-05-24 ENCOUNTER — Encounter (HOSPITAL_COMMUNITY): Payer: Self-pay

## 2019-05-24 ENCOUNTER — Other Ambulatory Visit: Payer: Self-pay | Admitting: "Endocrinology

## 2019-05-24 ENCOUNTER — Other Ambulatory Visit: Payer: Self-pay

## 2019-05-24 DIAGNOSIS — R634 Abnormal weight loss: Secondary | ICD-10-CM | POA: Insufficient documentation

## 2019-05-24 DIAGNOSIS — Z6822 Body mass index (BMI) 22.0-22.9, adult: Secondary | ICD-10-CM | POA: Insufficient documentation

## 2019-05-24 DIAGNOSIS — E038 Other specified hypothyroidism: Secondary | ICD-10-CM | POA: Insufficient documentation

## 2019-05-24 DIAGNOSIS — E119 Type 2 diabetes mellitus without complications: Secondary | ICD-10-CM | POA: Insufficient documentation

## 2019-05-24 LAB — COMPREHENSIVE METABOLIC PANEL
ALT: 12 U/L (ref 0–44)
AST: 20 U/L (ref 15–41)
Albumin: 2.4 g/dL — ABNORMAL LOW (ref 3.5–5.0)
Alkaline Phosphatase: 69 U/L (ref 38–126)
Anion gap: 6 (ref 5–15)
BUN: 17 mg/dL (ref 8–23)
CO2: 25 mmol/L (ref 22–32)
Calcium: 7.9 mg/dL — ABNORMAL LOW (ref 8.9–10.3)
Chloride: 105 mmol/L (ref 98–111)
Creatinine, Ser: 0.73 mg/dL (ref 0.44–1.00)
GFR calc Af Amer: >60 mL/min (ref >60)
GFR calc non Af Amer: >60 mL/min (ref >60)
Glucose, Bld: 78 mg/dL (ref 70–99)
Potassium: 3.2 mmol/L — ABNORMAL LOW (ref 3.5–5.1)
Sodium: 136 mmol/L (ref 135–145)
Total Bilirubin: 0.6 mg/dL (ref 0.3–1.2)
Total Protein: 7.1 g/dL (ref 6.5–8.1)

## 2019-05-24 LAB — VITAMIN D 25 HYDROXY (VIT D DEFICIENCY, FRACTURES): Vit D, 25-Hydroxy: 16.89 ng/mL — ABNORMAL LOW (ref 30–100)

## 2019-05-24 LAB — ACTH STIMULATION, 3 TIME POINTS
Cortisol, 30 Min: 21.5 ug/dL
Cortisol, 60 Min: 29.7 ug/dL
Cortisol, Base: 9 ug/dL

## 2019-05-24 LAB — TSH: TSH: 4.429 u[IU]/mL (ref 0.350–4.500)

## 2019-05-24 MED ORDER — COSYNTROPIN 0.25 MG IJ SOLR
0.2500 mg | Freq: Once | INTRAMUSCULAR | Status: AC
  Administered 2019-05-24: 0.25 mg via INTRAVENOUS
  Filled 2019-05-24: qty 0.25

## 2019-05-24 MED ORDER — CHOLECALCIFEROL 50 MCG (2000 UT) PO CAPS: 1.0000 | ORAL_CAPSULE | Freq: Every day | ORAL | 1 refills | Status: AC

## 2019-05-25 LAB — T4: T4, Total: 8.3 ug/dL (ref 4.5–12.0)

## 2019-05-26 ENCOUNTER — Ambulatory Visit (INDEPENDENT_AMBULATORY_CARE_PROVIDER_SITE_OTHER): Payer: Medicare Other

## 2019-05-26 ENCOUNTER — Ambulatory Visit (INDEPENDENT_AMBULATORY_CARE_PROVIDER_SITE_OTHER): Payer: Medicare Other | Admitting: Pulmonary Disease

## 2019-05-26 ENCOUNTER — Other Ambulatory Visit: Payer: Self-pay

## 2019-05-26 ENCOUNTER — Encounter: Payer: Self-pay | Admitting: Pulmonary Disease

## 2019-05-26 VITALS — BP 100/68 | HR 82 | Temp 97.6°F | Ht 69.0 in | Wt 151.6 lb

## 2019-05-26 DIAGNOSIS — R0600 Dyspnea, unspecified: Secondary | ICD-10-CM | POA: Diagnosis not present

## 2019-05-26 DIAGNOSIS — G4733 Obstructive sleep apnea (adult) (pediatric): Secondary | ICD-10-CM | POA: Diagnosis not present

## 2019-05-26 DIAGNOSIS — R0609 Other forms of dyspnea: Secondary | ICD-10-CM

## 2019-05-26 DIAGNOSIS — J9611 Chronic respiratory failure with hypoxia: Secondary | ICD-10-CM

## 2019-05-26 DIAGNOSIS — J849 Interstitial pulmonary disease, unspecified: Secondary | ICD-10-CM

## 2019-05-26 DIAGNOSIS — J4530 Mild persistent asthma, uncomplicated: Secondary | ICD-10-CM

## 2019-05-26 MED ORDER — FUROSEMIDE 20 MG PO TABS: 20.0000 mg | ORAL_TABLET | Freq: Every day | ORAL | 0 refills | Status: DC

## 2019-05-26 NOTE — Progress Notes (Signed)
Subjective:    Patient ID: [name], female    DOB: 07-Nov-1950, 70 y.o.   MRN: [id number]  HPI  69 yo moderate smoker with CT- ILD, severe OSA and persistent asthma Last seen 11/2017 for - bilateral pulmonary infiltrates and hypoxia  She has been maintained on 3 L oxygen since April 2019.  Prior to that she was supplied with an oxygen concentrator about 2 years ago but  was only using nocturnal   Chief Complaint  Patient presents with  . Follow-up    Patient is here for shortness of breath with exertion.    She had pericarditis in 2019 which was treated with colchicine, cardiac cath was negative   10/2017 hospital admission for dyspnea, CT showed diffuse infiltrates  positive ANA, positive chromatin antibody, and positive RNP   Her last office visit was in 11/2017 and we referred her to a rheumatologist, she has been lost to follow-up since then.  She returns now on complaints of shortness of breath with exertion. She has lost 100 pounds from her thin weight of 253 pounds to her current weight of 151 pounds.  She reports persistent vomiting and inability to keep her food, she has been evaluated by gastroenterology (DR Gala Romney ) EGD 05/2019 showed severe reflux esophagitis, moderate hiatal hernia and gastric erosions  She sees cardiology, Dr. Radford Pax and I note that MRI 12/2018 suggested hypertrophic cardiomyopathy.  She has been evaluated by EP for an ICD and this was deferred  I reviewed her rheumatology records from [facility], she was last seen 9/30 and prednisone was decreased to 5 mg (Dr Gerilyn Nestle ) -no mention is made of positive serology  Chest x-ray obtained today showed interstitial prominence and minimum bilateral patchy peripheral density with question of bronchiectasis   Significant tests/ events reviewed  Serology 10/2017 positive Smith antibody, SSA-Ro, histone antigen and RNP Negative SCL 70  PSG 07/11/08 >> AHI 88, SpO2 low 62%  Esophagram 02/2019  severe dysmotility, no stricture 11/2017 esophagram >> Nonspecific esophageal motility disorder. No stricture.  12/2011 CT chest shows bibasilar atelectasis CT angiogram 10/2017 bilateral predominantly lower lobe infiltrates without effusions , cardiomegaly with moderate pericardial effusion  CT angio chest 04/2018 Diffuse ground-glass opacities and lower lobe bronchiectasis which is improved from comparison exam of 12/22/2017. 3. Patulous esophagus. 4. 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  Past Medical History:  Diagnosis Date  . Anterolisthesis    Grade 1, L4-5  . Bronchospasm 05/28/2012  . COPD (chronic obstructive pulmonary disease) ([place])   . DEGENERATIVE JOINT DISEASE 10/06/2006  . DEPRESSION 09/26/2008  . DIABETES MELLITUS 10/06/2006  . Diverticulosis    [id number]  . GERD (gastroesophageal reflux disease) 07/25/2013  . Glaucoma   . HOCM (hypertrophic obstructive cardiomyopathy) ([place])   . HYPERLIPIDEMIA 01/11/2009  . HYPERTENSION 10/06/2006  . Hypokalemia   . Hypomagnesemia   . INSOMNIA 09/26/2008  . Internal hemorrhoids   . OBSTRUCTIVE SLEEP APNEA 06/23/2008   Severe OSA per sleep study 2010, Rx a CPAP  . Pain in joint, multiple sites 11/10/2006  . Pericardial effusion   . Pericarditis   . UNSPECIFIED ANEMIA 12/10/2009  . UTI (urinary tract infection) 11/2017     Review of Systems neg for any significant sore throat, dysphagia, itching, sneezing, nasal congestion or excess/ purulent secretions, fever, chills, sweats, unintended wt loss, pleuritic or exertional cp, hempoptysis, orthopnea pnd or change in chronic leg swelling. Also denies presyncope, palpitations, heartburn, abdominal pain, nausea, vomiting,  diarrhea or change in bowel or urinary habits, dysuria,hematuria, rash, arthralgias, visual complaints, headache, numbness weakness or ataxia.     Objective:   Physical Exam  Gen. Pleasant, thin woman, in no distress,  anxious/frustrated affect ENT - no pallor,icterus, no post nasal drip Neck: No JVD, no thyromegaly, no carotid bruits Lungs: no use of accessory muscles, no dullness to percussion, bibasal rales no rhonchi  Cardiovascular: Rhythm regular, heart sounds  normal, no murmurs or gallops, 2+ peripheral edema Abdomen: soft and non-tender, no hepatosplenomegaly, BS normal. Musculoskeletal: No deformities, no cyanosis or clubbing Neuro:  alert, non focal       Assessment & Plan:

## 2019-05-26 NOTE — Patient Instructions (Signed)
Prescription for Lasix 20 mg daily for 10 days Take potassium tablets with this.  Chest x-ray today  We will get you an appointment with Dr. Earnest Conroy -rheumatology at [facility] reassess you for lupus like inflammatory condition that is affecting your heart, lungs and esophagus

## 2019-05-28 NOTE — Assessment & Plan Note (Signed)
With her significant weight loss of 100 pounds, it is likely that OSA has improved. We will repeat home sleep test in the future and decide on the need for PAP therapy

## 2019-05-28 NOTE — Assessment & Plan Note (Signed)
Continue Advair and albuterol 

## 2019-05-28 NOTE — Assessment & Plan Note (Signed)
Continue oxygen on an as-needed basis

## 2019-05-28 NOTE — Assessment & Plan Note (Signed)
Given significant pedal edema, I gave her 20 mg of Lasix for 10 days.  This may be related to amlodipine or to diastolic heart failure

## 2019-05-28 NOTE — Assessment & Plan Note (Signed)
She seems to have waxing and waning interstitial infiltrates very suggestive of CT ILD I have reviewed notes from [facility] rheumatology -no mention is made of prior positive serology.  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  The patulous esophagus raises the question of scleroderma, however SCL 70 was negative.  We will refer her back to rheumatology and if necessary they can repeat her serology and start her on appropriate treatment

## 2019-06-10 ENCOUNTER — Telehealth: Payer: Self-pay | Admitting: "Endocrinology

## 2019-06-10 NOTE — Telephone Encounter (Signed)
Left a message requesting a return call to the office. 

## 2019-06-10 NOTE — Telephone Encounter (Signed)
Patient is calling and would like to speak with someone about the vitamin D3 she is taking.

## 2019-06-24 ENCOUNTER — Ambulatory Visit: Payer: Medicare Other | Admitting: Cardiology

## 2019-06-27 ENCOUNTER — Ambulatory Visit: Payer: Medicare Other | Admitting: Physician Assistant

## 2019-06-27 NOTE — Progress Notes (Signed)
Cardiology Office Note    Date:  06/28/2019   ID:  [name], DOB [birth year], MRN [id number]  PCP:  Maryruth Hancock, MD  Cardiologist: Fransico Him, MD EPS: None  No chief complaint on file.   History of Present Illness:  [name] is a 69 y.o. female with history of chronic chest pain, HLD, hypertension, DM type II, OSA on CPAP, hypothyroidism, COPD with chronic respiratory failure on home O2, obesity, history of pericardial effusion in 2019, cardiac cath 10/2017 normal coronary arteries LVEF 55 to 65%.  Cardiac MRI 03/25/2019 LVEF 56% with mild almost circumferential pericardial effusion, pericardium with normal thickness but late gadolinium enhancement consistent with inflammation consistent with acute pericarditis.  She was treated with ibuprofen and colchicine HCM with patchy LGE and NSVT on cardiac monitor.  No syncope but class II dyspnea.  She saw Dr. Lovena Le for consideration of ICD.  He was concerned about her unintentional 150 pound weight loss recently and suspected autoimmune disease with question of scleroderma.  In light of her comorbidities he felt the benefit of ICD would be minimal.  If her collagen vascular disease were to abate and the weight loss resolve or if she were to have VT or malignant arrhythmia then he would be willing to reconsider ICD insertion.  Patient saw Dr. Elsworth Soho 05/26/2019 and was given 10-day course of Lasix because of pedal edema. Long history of noncompliance.  Patient comes in today and complains of leg swelling for 2 months. Lasix helped but she ran out and feet hurt. Dyspnea on exertion.BP high today and was up yesterday. No chest pian, dizziness, or syncope.  Past Medical History:  Diagnosis Date  . Anterolisthesis    Grade 1, L4-5  . Bronchospasm 05/28/2012  . COPD (chronic obstructive pulmonary disease) ([place])   . DEGENERATIVE JOINT DISEASE 10/06/2006  . DEPRESSION 09/26/2008  . DIABETES MELLITUS 10/06/2006  . Diverticulosis    [id number]  . GERD (gastroesophageal reflux disease) 07/25/2013  . Glaucoma   . HOCM (hypertrophic obstructive cardiomyopathy) ([place])   . HYPERLIPIDEMIA 01/11/2009  . HYPERTENSION 10/06/2006  . Hypokalemia   . Hypomagnesemia   . INSOMNIA 09/26/2008  . Internal hemorrhoids   . OBSTRUCTIVE SLEEP APNEA 06/23/2008   Severe OSA per sleep study 2010, Rx a CPAP  . Pain in joint, multiple sites 11/10/2006  . Pericardial effusion   . Pericarditis   . UNSPECIFIED ANEMIA 12/10/2009  . UTI (urinary tract infection) 11/2017    Past Surgical History:  Procedure Laterality Date  . BIOPSY  12/28/2017   Procedure: BIOPSY;  Surgeon: Rush Landmark Telford Nab., MD;  Location: [facility];  Service: Gastroenterology;;  . BIOPSY  05/19/2019   Procedure: BIOPSY;  Surgeon: Daneil Dolin, MD;  [facility];  Service: Endoscopy;;  gastric  . COLONOSCOPY  08/01/2011   Procedure: COLONOSCOPY;  Surgeon: Inda Castle, MD;  Location: [facility];  Service: Endoscopy;  Laterality: N/A;. hyperplastic polyp removed, hemorrhoids s/p banding, diverticulosis, next tcs 10 years.   . ESOPHAGOGASTRODUODENOSCOPY (EGD) WITH PROPOFOL N/A 12/28/2017   Procedure: ESOPHAGOGASTRODUODENOSCOPY (EGD) WITH PROPOFOL;  Surgeon: Rush Landmark Telford Nab., MD;  Location: [facility];  Service: Gastroenterology;  Laterality: N/A; h.pylori gastiris, tortuous esophagus with esophageal bx (no EOE), medium sized hiatal hernia  . ESOPHAGOGASTRODUODENOSCOPY (EGD) WITH PROPOFOL N/A 05/19/2019   Procedure: ESOPHAGOGASTRODUODENOSCOPY (EGD) WITH PROPOFOL;  Surgeon: Daneil Dolin, MD;  [facility];  Service: Endoscopy;  Laterality: N/A;  8:30AM  . LEFT HEART CATH  AND CORONARY ANGIOGRAPHY N/A 10/22/2017   Procedure: LEFT HEART CATH AND CORONARY ANGIOGRAPHY;  Surgeon: Jettie Booze, MD;  Location: [facility];  Service: Cardiovascular;  Laterality: N/A;  . 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 Right knee replacement  2005     Current Medications: Current Meds  Medication Sig  . Accu-Chek Softclix Lancets lancets USE TO CHECK BLOOD SUGAR TWICE A DAY  . albuterol (VENTOLIN HFA) 108 (90 Base) MCG/ACT inhaler Inhale 2 puffs into the lungs every 4 (four) hours as needed for wheezing or shortness of breath.  Marland Kitchen alum & mag hydroxide-simeth (MAALOX/MYLANTA) 200-200-20 MG/5ML suspension Take 30 mLs by mouth every 4 (four) hours as needed for indigestion.  Marland Kitchen amLODipine (NORVASC) 10 MG tablet Take 1 tablet (10 mg total) by mouth daily.  . ARIPiprazole (ABILIFY) 5 MG tablet Take 1 tablet by mouth daily.  Marland Kitchen aspirin EC 81 MG tablet Take 1 tablet (81 mg total) by mouth daily.  . Cholecalciferol 50 MCG (2000 UT) CAPS Take 1 capsule (2,000 Units total) by mouth daily with breakfast.  . escitalopram (LEXAPRO) 20 MG tablet TAKE 1 TABLET BY MOUTH  DAILY (Patient taking differently: Take 20 mg by mouth daily. )  . Fluticasone-Salmeterol (WIXELA INHUB) 250-50 MCG/DOSE AEPB Inhale 1 puff into the lungs 2 (two) times daily.  Marland Kitchen glucose blood (ACCU-CHEK AVIVA PLUS) test strip TEST BLOOD SUGAR TWICE DAILY AND LANCETS TWICE DAILY E11.9  . latanoprost (XALATAN) 0.005 % ophthalmic solution Place 1 drop into both eyes at bedtime.  Marland Kitchen levothyroxine (SYNTHROID) 88 MCG tablet Take 1 tablet (88 mcg total) by mouth daily before breakfast.  . omeprazole (PRILOSEC) 40 MG capsule Take 1 capsule (40 mg total) by mouth 2 (two) times daily before lunch and supper.  . ondansetron (ZOFRAN) 4 MG tablet Take 1 tablet (4 mg total) by mouth every 6 (six) hours as needed for nausea.  Marland Kitchen OVER THE COUNTER MEDICATION CPAP: At bedtime  . OXYGEN Inhale 2 L into the lungs daily as needed (shortness of breath).   . potassium chloride SA (K-DUR) 20 MEQ tablet Take 1 tablet (20 mEq total) by mouth 2 (two) times daily.  . rosuvastatin (CRESTOR) 40 MG tablet TAKE 1 TABLET BY MOUTH IN  THE EVENING (Patient taking differently: Take 40 mg by mouth at bedtime. )      Allergies:   Patient has no known allergies.   Social History   Socioeconomic History  . Marital status: Widowed    Spouse name: Not on file  . Number of children: 4  . Years of education: Not on file  . Highest education level: Not on file  Occupational History  . Occupation: disability    Employer: UNEMPLOYED  Tobacco Use  . Smoking status: Former Smoker    Packs/day: 0.20    Years: 4.00    Pack years: 0.80    Types: Cigarettes    Quit date: 05/13/1995    Years since quitting: 24.1  . Smokeless tobacco: Never Used  Substance and Sexual Activity  . Alcohol use: Not Currently  . Drug use: No  . Sexual activity: Not Currently  Other Topics Concern  . Not on file  Social History Narrative   Widow , lives by herself   Lost a son, 3 living    No children in [place], [place] lives in [place], he visits and helps w/ shopping    Lost husband   Social Determinants of Health   Financial Resource Strain:   .  Difficulty of Paying Living Expenses: Not on file  Food Insecurity:   . Worried About Charity fundraiser in the Last Year: Not on file  . Ran Out of Food in the Last Year: Not on file  Transportation Needs:   . Lack of Transportation (Medical): Not on file  . Lack of Transportation (Non-Medical): Not on file  Physical Activity:   . Days of Exercise per Week: Not on file  . Minutes of Exercise per Session: Not on file  Stress:   . Feeling of Stress : Not on file  Social Connections:   . Frequency of Communication with Friends and Family: Not on file  . Frequency of Social Gatherings with Friends and Family: Not on file  . Attends Religious Services: Not on file  . Active Member of Clubs or Organizations: Not on file  . Attends Archivist Meetings: Not on file  . Marital Status: Not on file     Family History:  The patient's   family history includes Asthma in her mother; Diabetes in her mother and another family member; Hypertension in her sister; Pancreatic  cancer in her brother; Stroke in her mother.   ROS:   Please see the history of present illness.    ROS All other systems reviewed and are negative.   PHYSICAL EXAM:   VS:  BP (!) 150/90   Pulse 86   Ht 5\' 9"  (1.753 m)   Wt 158 lb 7.2 oz (71.9 kg)   SpO2 98%   BMI 23.40 kg/m   Physical Exam  GEN: Well nourished, well developed, in no acute distress  Neck: no JVD, carotid bruits, or masses Cardiac:RRR; 3/6 systolic murmur  Respiratory:  clear to auscultation bilaterally, normal work of breathing GI: soft, nontender, nondistended, + BS Ext: +2 edema bilaterally  Good distal pulses bilaterally Neuro:  Alert and Oriented x 3 Psych: euthymic mood, full affect  Wt Readings from Last 3 Encounters:  06/28/19 158 lb 7.2 oz (71.9 kg)  05/26/19 151 lb 9.6 oz (68.8 kg)  05/24/19 154 lb 8.7 oz (70.1 kg)      Studies/Labs Reviewed:   EKG:  EKG is not ordered today.  T  Recent Labs: 05/17/2019: Hemoglobin 10.0; Platelets 377 05/24/2019: ALT 12; BUN 17; Creatinine, Ser 0.73; Potassium 3.2; Sodium 136; TSH 4.429   Lipid Panel    Component Value Date/Time   CHOL 94 11/18/2017 0012   TRIG 110 11/18/2017 0012   HDL 30 (L) 11/18/2017 0012   CHOLHDL 3.1 11/18/2017 0012   VLDL 22 11/18/2017 0012   LDLCALC 42 11/18/2017 0012    Additional studies/ records that were reviewed today include:  Echo 10/2018  IMPRESSIONS     1. The left ventricle has normal systolic function, with an ejection  fraction of 55-60%. The cavity size was normal. There is mild concentric  left ventricular hypertrophy. Left ventricular diastolic Doppler  parameters are consistent with impaired  relaxation. Indeterminate filling pressures.   2. Mildly thickened tricuspid valve leaflets.   3. The mitral valve is grossly normal. Mild thickening of the mitral  valve leaflet. There is mild mitral annular calcification present.   4. The tricuspid valve is grossly normal.   5. The aortic valve is tricuspid. Mild  thickening of the aortic valve.  Mild calcification of the aortic valve.   6. The aortic root is normal in size and structure.    Monitor 01/26/19  Normal sinus rhythm and sinus tachycardia. The average  heart rate was 87bpm and the heart rate ranged from 66 to 135bpm.  Wide complex tachycardia for 5 beats, likely atrial tachcycardia with aberration.  Occasional PVCs and ventricular couplets.  Occasional PACs and atrial couplets.  Paroxysmal atrial tachycarda at 135bpm.    MRI 01/05/19 IMPRESSION: 1. Normal LV size, moderate concentric hypertrophy, and normal systolic function ([id number])   2.  Normal RV size and systolic function (EF [id number])   3. Patchy late gadolinium enhancement at LV basal septum and inferior RV insertion site. This type of LGE pattern is often seen in hypertrophic cardiomyopathy. Her hypertrophy appears more concentric, though there is mild asymmetry (39mm in basal septum vs 14 mm in lateral wall). Would consider HCM, particularly if degree of hypertrophy is not explained by other causes   4. Small pericardial effusion, primarily located adjacent to LV free wall measuring up to 26mm. Normal pericardial thickness. No evidence of ventricular interdependence on real time imaging to suggest constriction. No pericardial LGE.        ASSESSMENT:    1. HOCM (hypertrophic obstructive cardiomyopathy) ([place])   2. Acute on chronic diastolic CHF (congestive heart failure) ([facility])   3. History of pericarditis   4. NSVT (nonsustained ventricular tachycardia) ([place])   5. Essential hypertension   6. Controlled type 2 diabetes mellitus without complication, without long-term current use of insulin ([facility])      PLAN:  In order of problems listed above:  Hypertrophic cardiomyopathy patchy LGE and NSVT on cardiac monitor evaluated by Dr. Lovena Le for ICD.  At this time with her unintentional 150 pound weight loss and other comorbidities he wants to hold off on ICD unless things  change.  Please refer to his note for details.  Acute on chronic diastolic CHF with leg swelling and dyspnea on exertion. Lasix given by Dr. Elsworth Soho helped but only 10 day supply. Echo 10/2018 LVEF 55-60% with impaired relaxation.Resume lasix 20 mg daily  Check bmet today-K 3.2 on 05/24/19  Pericarditis refused NSAIDS due to GERD and can only tolerate low dose colchicine due to diarrhea.  No significant chest pain today  NSVT asymptomatic  Hypertension-BP elevated. Will see if it comes down with lasix and reduced sodium  Diabetes mellitus managed by PCP    Medication Adjustments/Labs and Tests Ordered: Current medicines are reviewed at length with the patient today.  Concerns regarding medicines are outlined above.  Medication changes, Labs and Tests ordered today are listed in the Patient Instructions below. Patient Instructions   Medication Instructions:  Your physician has recommended you make the following change in your medication:   START: furosemide (lasix) 20 mg tablet: Take 1 tablet by mouth once a day   If you need a refill on your cardiac medications before your next appointment, please call your pharmacy.   Lab work: TODAY: BMET  If you have labs (blood work) drawn today and your tests are completely normal, you will receive your results only by: Marland Kitchen MyChart Message (if you have MyChart) OR . A paper copy in the mail If you have any lab test that is abnormal or we need to change your treatment, we will call you to review the results.  Testing/Procedures: None ordered  Follow-Up: Follow up with Dr. Radford Pax on 08/09/19 at 11:00 AM  Any Other Special Instructions Will Be Listed Below (If Applicable).  We are recommending the COVID-19 vaccine to all of our patients. Cardiac medications (including blood thinners) should not deter anyone from being vaccinated and there is  no need to hold any of those medications prior to vaccine administration.   Currently, there is a hotline  to call (active 05/20/19) to schedule vaccination appointments as no walk-ins will be accepted.    Vaccines through the health department can be arranged by calling [phone]   Vaccines through [facility] can be arranged by calling [phone] or visiting [email or web address]   If you have further questions or concerns about the vaccine process, please visit [email or web address], [phone], or contact your primary care physician.    Two Gram Sodium Diet 2000 mg  What is Sodium? Sodium is a mineral found naturally in many foods. The most significant source of sodium in the diet is table salt, which is about 40% sodium.  Processed, convenience, and preserved foods also contain a large amount of sodium.  The body needs only 500 mg of sodium daily to function,  A normal diet provides more than enough sodium even if you do not use salt.  Why Limit Sodium? A build up of sodium in the body can cause thirst, increased blood pressure, shortness of breath, and water retention.  Decreasing sodium in the diet can reduce edema and risk of heart attack or stroke associated with high blood pressure.  Keep in mind that there are many other factors involved in these health problems.  Heredity, obesity, lack of exercise, cigarette smoking, stress and what you eat all play a role.  General Guidelines:  Do not add salt at the table or in cooking.  One teaspoon of salt contains over 2 grams of sodium.  Read food labels  Avoid processed and convenience foods  Ask your dietitian before eating any foods not dicussed in the menu planning guidelines  Consult your physician if you wish to use a salt substitute or a sodium containing medication such as antacids.  Limit milk and milk products to 16 oz (2 cups) per day.  Shopping Hints:  READ LABELS!! "Dietetic" does not necessarily mean low sodium.  Salt and other sodium ingredients are often added to foods during  processing.   Menu Planning Guidelines Food Group Choose More Often Avoid  Beverages (see also the milk group All fruit juices, low-sodium, salt-free vegetables juices, low-sodium carbonated beverages Regular vegetable or tomato juices, commercially softened water used for drinking or cooking  Breads and Cereals Enriched white, wheat, rye and pumpernickel bread, hard rolls and dinner rolls; muffins, cornbread and waffles; most dry cereals, cooked cereal without added salt; unsalted crackers and breadsticks; low sodium or homemade bread crumbs Bread, rolls and crackers with salted tops; quick breads; instant hot cereals; pancakes; commercial bread stuffing; self-rising flower and biscuit mixes; regular bread crumbs or cracker crumbs  Desserts and Sweets Desserts and sweets mad with mild should be within allowance Instant pudding mixes and cake mixes  Fats Butter or margarine; vegetable oils; unsalted salad dressings, regular salad dressings limited to 1 Tbs; light, sour and heavy cream Regular salad dressings containing bacon fat, bacon bits, and salt pork; snack dips made with instant soup mixes or processed cheese; salted nuts  Fruits Most fresh, frozen and canned fruits Fruits processed with salt or sodium-containing ingredient (some dried fruits are processed with sodium sulfites        Vegetables Fresh, frozen vegetables and low- sodium canned vegetables Regular canned vegetables, sauerkraut, pickled vegetables, and others prepared in brine; frozen vegetables in sauces; vegetables seasoned with ham, bacon or salt pork  Condiments, Sauces, Miscellaneous  Salt substitute with physician's approval; pepper, herbs, spices; vinegar, lemon  or lime juice; hot pepper sauce; garlic powder, onion powder, low sodium soy sauce (1 Tbs.); low sodium condiments (ketchup, chili sauce, mustard) in limited amounts (1 tsp.) fresh ground horseradish; unsalted tortilla chips, pretzels, potato chips, popcorn, salsa  (1/4 cup) Any seasoning made with salt including garlic salt, celery salt, onion salt, and seasoned salt; sea salt, rock salt, kosher salt; meat tenderizers; monosodium glutamate; mustard, regular soy sauce, barbecue, sauce, chili sauce, teriyaki sauce, steak sauce, Worcestershire sauce, and most flavored vinegars; canned gravy and mixes; regular condiments; salted snack foods, olives, picles, relish, horseradish sauce, catsup   Food preparation: Try these seasonings Meats:    Pork Sage, onion Serve with applesauce  Chicken Poultry seasoning, thyme, parsley Serve with cranberry sauce  Lamb Curry powder, rosemary, garlic, thyme Serve with mint sauce or jelly  Veal Marjoram, basil Serve with current jelly, cranberry sauce  Beef Pepper, bay leaf Serve with dry mustard, unsalted chive butter  Fish [place] leaf, dill Serve with unsalted lemon butter, unsalted parsley butter  Vegetables:    Asparagus Lemon juice   Broccoli Lemon juice   Carrots Mustard dressing parsley, mint, nutmeg, glazed with unsalted butter and sugar   Green beans Marjoram, lemon juice, nutmeg,dill seed   Tomatoes Basil, marjoram, onion   Spice /blend for [employer]" 4 tsp ground thyme 1 tsp ground sage 3 tsp ground rosemary 4 tsp ground marjoram   Test your knowledge 1. A product that says "Salt Free" may still contain sodium. True or False 2. Garlic Powder and Hot Pepper Sauce an be used as alternative seasonings.True or False 3. Processed foods have more sodium than fresh foods.  True or False 4. Canned Vegetables have less sodium than froze True or False  WAYS TO DECREASE YOUR SODIUM INTAKE 1. Avoid the use of added salt in cooking and at the table.  Table salt (and other prepared seasonings which contain salt) is probably one of the greatest sources of sodium in the diet.  Unsalted foods can gain flavor from the sweet, sour, and butter taste sensations of herbs and spices.  Instead of using salt for seasoning, try the  following seasonings with the foods listed.  Remember: how you use them to enhance natural food flavors is limited only by your creativity... Allspice-Meat, fish, eggs, fruit, peas, red and yellow vegetables Almond Extract-Fruit baked goods Anise Seed-Sweet breads, fruit, carrots, beets, cottage cheese, cookies (tastes like licorice) Basil-Meat, fish, eggs, vegetables, rice, vegetables salads, soups, sauces Bay Leaf-Meat, fish, stews, poultry Burnet-Salad, vegetables (cucumber-like flavor) Caraway Seed-Bread, cookies, cottage cheese, meat, vegetables, cheese, rice Cardamon-Baked goods, fruit, soups Celery Powder or seed-Salads, salad dressings, sauces, meatloaf, soup, bread.Do not use  celery salt Chervil-Meats, salads, fish, eggs, vegetables, cottage cheese (parsley-like flavor) Chili Power-Meatloaf, chicken cheese, corn, eggplant, egg dishes Chives-Salads cottage cheese, egg dishes, soups, vegetables, sauces Cilantro-Salsa, casseroles Cinnamon-Baked goods, fruit, pork, lamb, chicken, carrots Cloves-Fruit, baked goods, fish, pot roast, green beans, beets, carrots Coriander-Pastry, cookies, meat, salads, cheese (lemon-orange flavor) Cumin-Meatloaf, fish,cheese, eggs, cabbage,fruit pie (caraway flavor) [employer], fruit, eggs, fish, poultry, cottage cheese, vegetables Dill Seed-Meat, cottage cheese, poultry, vegetables, fish, salads, bread Fennel Seed-Bread, cookies, apples, pork, eggs, fish, beets, cabbage, cheese, Licorice-like flavor Garlic-(buds or powder) Salads, meat, poultry, fish, bread, butter, vegetables, potatoes.Do not  use garlic salt Ginger-Fruit, vegetables, baked goods, meat, fish, poultry Horseradish Root-Meet, vegetables, butter Lemon Juice or Extract-Vegetables, fruit, tea, baked goods, fish salads Mace-Baked goods fruit, vegetables, fish, poultry (taste like nutmeg) Maple Extract-Syrups Marjoram-Meat, chicken,  fish, vegetables, breads, green salads (taste like  Sage) Mint-Tea, lamb, sherbet, vegetables, desserts, carrots, cabbage Mustard, Dry or Seed-Cheese, eggs, meats, vegetables, poultry Nutmeg-Baked goods, fruit, chicken, eggs, vegetables, desserts Onion Powder-Meat, fish, poultry, vegetables, cheese, eggs, bread, rice salads (Do not use   Onion salt) Orange Extract-Desserts, baked goods Oregano-Pasta, eggs, cheese, onions, pork, lamb, fish, chicken, vegetables, green salads Paprika-Meat, fish, poultry, eggs, cheese, vegetables Parsley Flakes-Butter, vegetables, meat fish, poultry, eggs, bread, salads (certain forms may   Contain sodium Pepper-Meat fish, poultry, vegetables, eggs Peppermint Extract-Desserts, baked goods Poppy Seed-Eggs, bread, cheese, fruit dressings, baked goods, noodles, vegetables, cottage  [employer], poultry, meat, fish, cauliflower, turnips,eggs bread Saffron-Rice, bread, veal, chicken, fish, eggs Sage-Meat, fish, poultry, onions, eggplant, tomateos, pork, stews Savory-Eggs, salads, poultry, meat, rice, vegetables, soups, pork Tarragon-Meat, poultry, fish, eggs, butter, vegetables (licorice-like flavor)  Thyme-Meat, poultry, fish, eggs, vegetables, (clover-like flavor), sauces, soups Tumeric-Salads, butter, eggs, fish, rice, vegetables (saffron-like flavor) Vanilla Extract-Baked goods, candy Vinegar-Salads, vegetables, meat marinades Walnut Extract-baked goods, candy  2. Choose your Foods Wisely   The following is a list of foods to avoid which are high in sodium:  Meats-Avoid all smoked, canned, salt cured, dried and kosher meat and fish as well as Anchovies   [employer] meats:Bologna, Liverwurst, Pastrami Canned meat or fish  Marinated herring Caviar    Pepperoni Corned Beef   Pizza Dried chipped beef  Salami Frozen breaded fish or meat Salt pork Frankfurters or hot dogs  Sardines Gefilte fish   Sausage Ham (boiled ham, Proscuitto Smoked butt    spiced  ham)   Spam      TV Dinners Vegetables Canned vegetables (Regular) Relish Canned mushrooms  Sauerkraut Olives    Tomato juice Pickles  Bakery and Dessert Products Canned puddings  Cream pies Cheesecake   Decorated cakes Cookies  Beverages/Juices Tomato juice, regular  Gatorade   V-8 vegetable juice, regular  Breads and Cereals Biscuit mixes   Salted potato chips, corn chips, pretzels Bread stuffing mixes  Salted crackers and rolls Pancake and waffle mixes Self-rising flour  Seasonings Accent    Meat sauces Barbecue sauce  Meat tenderizer Catsup    Monosodium glutamate (MSG) Celery salt   Onion salt Chili sauce   Prepared mustard Garlic salt   Salt, seasoned salt, sea salt Gravy mixes   Soy sauce Horseradish   Steak sauce Ketchup   Tartar sauce Lite salt    Teriyaki sauce Marinade mixes   Worcestershire sauce  Others Baking powder   Cocoa and cocoa mixes Baking soda   Commercial casserole mixes Candy-caramels, chocolate  Dehydrated soups    Bars, fudge,nougats  Instant rice and pasta mixes Canned broth or soup  Maraschino cherries Cheese, aged and processed cheese and cheese spreads  Learning Assessment Quiz  Indicated T (for True) or F (for False) for each of the following statements:  1. _____ Fresh fruits and vegetables and unprocessed grains are generally low in sodium 2. _____ Water may contain a considerable amount of sodium, depending on the source 3. _____ You can always tell if a food is high in sodium by tasting it 4. _____ Certain laxatives my be high in sodium and should be avoided unless prescribed   by a physician or pharmacist 5. _____ Salt substitutes may be used freely by anyone on a sodium restricted diet 6. _____ Sodium is present in table salt, food additives and as a natural component of   most foods  7. _____ Table salt is approximately 90% sodium 8. _____ Limiting sodium intake may help prevent excess fluid accumulation in the  body 9. _____ On a sodium-restricted diet, seasonings such as bouillon soy sauce, and    cooking wine should be used in place of table salt 10. _____ On an ingredient list, a product which lists monosodium glutamate as the first   ingredient is an appropriate food to include on a low sodium diet  Circle the best answer(s) to the following statements (Hint: there may be more than one correct answer)  11. On a low-sodium diet, some acceptable snack items are:    A. Olives  F. Bean dip   K. Grapefruit juice    B. Salted Pretzels G. Commercial Popcorn   L. Canned peaches    C. Carrot Sticks  H. Bouillon   M. Unsalted nuts   D. [place] fries  I. Peanut butter crackers N. Salami   E. Sweet pickles J. Tomato Juice   O. Pizza  12.  Seasonings that may be used freely on a reduced - sodium diet include   A. Lemon wedges F.Monosodium glutamate K. Celery seed    B.Soysauce   G. Pepper   L. Mustard powder   C. Sea salt  H. Cooking wine  M. Onion flakes   D. Vinegar  E. Prepared horseradish N. Salsa   E. Sage   J. Worcestershire sauce  O. [address]     Sumner Boast, PA-C  06/28/2019 11:20 AM    [facility] [place], [place], [place]  [address] Phone: [phone]; Fax: [phone]

## 2019-06-28 ENCOUNTER — Ambulatory Visit (INDEPENDENT_AMBULATORY_CARE_PROVIDER_SITE_OTHER): Payer: Medicare Other | Admitting: Physician Assistant

## 2019-06-28 ENCOUNTER — Encounter: Payer: Self-pay | Admitting: Physician Assistant

## 2019-06-28 ENCOUNTER — Other Ambulatory Visit: Payer: Self-pay

## 2019-06-28 ENCOUNTER — Encounter (INDEPENDENT_AMBULATORY_CARE_PROVIDER_SITE_OTHER): Payer: Self-pay

## 2019-06-28 VITALS — BP 150/90 | HR 86 | Ht 69.0 in | Wt 158.4 lb

## 2019-06-28 DIAGNOSIS — Z8679 Personal history of other diseases of the circulatory system: Secondary | ICD-10-CM

## 2019-06-28 DIAGNOSIS — I10 Essential (primary) hypertension: Secondary | ICD-10-CM

## 2019-06-28 DIAGNOSIS — I472 Ventricular tachycardia: Secondary | ICD-10-CM

## 2019-06-28 DIAGNOSIS — I421 Obstructive hypertrophic cardiomyopathy: Secondary | ICD-10-CM | POA: Diagnosis not present

## 2019-06-28 DIAGNOSIS — I5033 Acute on chronic diastolic (congestive) heart failure: Secondary | ICD-10-CM | POA: Diagnosis not present

## 2019-06-28 DIAGNOSIS — E119 Type 2 diabetes mellitus without complications: Secondary | ICD-10-CM

## 2019-06-28 DIAGNOSIS — I4729 Other ventricular tachycardia: Secondary | ICD-10-CM

## 2019-06-28 LAB — BASIC METABOLIC PANEL
BUN/Creatinine Ratio: 18 (ref 12–28)
BUN: 13 mg/dL (ref 8–27)
CO2: 23 mmol/L (ref 20–29)
Calcium: 8.1 mg/dL — ABNORMAL LOW (ref 8.7–10.3)
Chloride: 100 mmol/L (ref 96–106)
Creatinine, Ser: 0.71 mg/dL (ref 0.57–1.00)
GFR calc Af Amer: 100 mL/min/{1.73_m2} (ref >59)
GFR calc non Af Amer: 87 mL/min/{1.73_m2} (ref >59)
Glucose: 81 mg/dL (ref 65–99)
Potassium: 3.8 mmol/L (ref 3.5–5.2)
Sodium: 135 mmol/L (ref 134–144)

## 2019-06-28 MED ORDER — POTASSIUM CHLORIDE ER 10 MEQ PO TBCR: 10.0000 meq | EXTENDED_RELEASE_TABLET | Freq: Every day | ORAL | 3 refills | Status: DC

## 2019-06-28 MED ORDER — FUROSEMIDE 20 MG PO TABS: 20.0000 mg | ORAL_TABLET | Freq: Every day | ORAL | 3 refills | Status: AC

## 2019-06-28 NOTE — Patient Instructions (Addendum)
Medication Instructions:  Your physician has recommended you make the following change in your medication:   START: furosemide (lasix) 20 mg tablet: Take 1 tablet by mouth once a day   If you need a refill on your cardiac medications before your next appointment, please call your pharmacy.   Lab work: TODAY: BMET  If you have labs (blood work) drawn today and your tests are completely normal, you will receive your results only by: Marland Kitchen MyChart Message (if you have MyChart) OR . A paper copy in the mail If you have any lab test that is abnormal or we need to change your treatment, we will call you to review the results.  Testing/Procedures: None ordered  Follow-Up: Follow up with Dr. Radford Pax on 08/09/19 at 11:00 AM  Any Other Special Instructions Will Be Listed Below (If Applicable).  We are recommending the COVID-19 vaccine to all of our patients. Cardiac medications (including blood thinners) should not deter anyone from being vaccinated and there is no need to hold any of those medications prior to vaccine administration.   Currently, there is a hotline to call (active 05/20/19) to schedule vaccination appointments as no walk-ins will be accepted.    Vaccines through the health department can be arranged by calling [phone]   Vaccines through [facility] can be arranged by calling [phone] or visiting [email or web address]   If you have further questions or concerns about the vaccine process, please visit [email or web address], [phone], or contact your primary care physician.    Two Gram Sodium Diet 2000 mg  What is Sodium? Sodium is a mineral found naturally in many foods. The most significant source of sodium in the diet is table salt, which is about 40% sodium.  Processed, convenience, and preserved foods also contain a large amount of sodium.  The body needs only 500 mg of sodium daily to function,  A normal diet provides more  than enough sodium even if you do not use salt.  Why Limit Sodium? A build up of sodium in the body can cause thirst, increased blood pressure, shortness of breath, and water retention.  Decreasing sodium in the diet can reduce edema and risk of heart attack or stroke associated with high blood pressure.  Keep in mind that there are many other factors involved in these health problems.  Heredity, obesity, lack of exercise, cigarette smoking, stress and what you eat all play a role.  General Guidelines:  Do not add salt at the table or in cooking.  One teaspoon of salt contains over 2 grams of sodium.  Read food labels  Avoid processed and convenience foods  Ask your dietitian before eating any foods not dicussed in the menu planning guidelines  Consult your physician if you wish to use a salt substitute or a sodium containing medication such as antacids.  Limit milk and milk products to 16 oz (2 cups) per day.  Shopping Hints:  READ LABELS!! "Dietetic" does not necessarily mean low sodium.  Salt and other sodium ingredients are often added to foods during processing.   Menu Planning Guidelines Food Group Choose More Often Avoid  Beverages (see also the milk group All fruit juices, low-sodium, salt-free vegetables juices, low-sodium carbonated beverages Regular vegetable or tomato juices, commercially softened water used for drinking or cooking  Breads and Cereals Enriched white, wheat, rye and pumpernickel bread, hard rolls and dinner rolls; muffins, cornbread and waffles; most dry cereals, cooked cereal without added salt; unsalted crackers and breadsticks; low sodium or homemade bread  crumbs Bread, rolls and crackers with salted tops; quick breads; instant hot cereals; pancakes; commercial bread stuffing; self-rising flower and biscuit mixes; regular bread crumbs or cracker crumbs  Desserts and Sweets Desserts and sweets mad with mild should be within allowance Instant pudding mixes and  cake mixes  Fats Butter or margarine; vegetable oils; unsalted salad dressings, regular salad dressings limited to 1 Tbs; light, sour and heavy cream Regular salad dressings containing bacon fat, bacon bits, and salt pork; snack dips made with instant soup mixes or processed cheese; salted nuts  Fruits Most fresh, frozen and canned fruits Fruits processed with salt or sodium-containing ingredient (some dried fruits are processed with sodium sulfites        Vegetables Fresh, frozen vegetables and low- sodium canned vegetables Regular canned vegetables, sauerkraut, pickled vegetables, and others prepared in brine; frozen vegetables in sauces; vegetables seasoned with ham, bacon or salt pork  Condiments, Sauces, Miscellaneous  Salt substitute with physician's approval; pepper, herbs, spices; vinegar, lemon or lime juice; hot pepper sauce; garlic powder, onion powder, low sodium soy sauce (1 Tbs.); low sodium condiments (ketchup, chili sauce, mustard) in limited amounts (1 tsp.) fresh ground horseradish; unsalted tortilla chips, pretzels, potato chips, popcorn, salsa (1/4 cup) Any seasoning made with salt including garlic salt, celery salt, onion salt, and seasoned salt; sea salt, rock salt, kosher salt; meat tenderizers; monosodium glutamate; mustard, regular soy sauce, barbecue, sauce, chili sauce, teriyaki sauce, steak sauce, Worcestershire sauce, and most flavored vinegars; canned gravy and mixes; regular condiments; salted snack foods, olives, picles, relish, horseradish sauce, catsup   Food preparation: Try these seasonings Meats:    Pork Sage, onion Serve with applesauce  Chicken Poultry seasoning, thyme, parsley Serve with cranberry sauce  Lamb Curry powder, rosemary, garlic, thyme Serve with mint sauce or jelly  Veal Marjoram, basil Serve with current jelly, cranberry sauce  Beef Pepper, bay leaf Serve with dry mustard, unsalted chive butter  Fish [place] leaf, dill Serve with unsalted lemon  butter, unsalted parsley butter  Vegetables:    Asparagus Lemon juice   Broccoli Lemon juice   Carrots Mustard dressing parsley, mint, nutmeg, glazed with unsalted butter and sugar   Green beans Marjoram, lemon juice, nutmeg,dill seed   Tomatoes Basil, marjoram, onion   Spice /blend for [employer]" 4 tsp ground thyme 1 tsp ground sage 3 tsp ground rosemary 4 tsp ground marjoram   Test your knowledge 1. A product that says "Salt Free" may still contain sodium. True or False 2. Garlic Powder and Hot Pepper Sauce an be used as alternative seasonings.True or False 3. Processed foods have more sodium than fresh foods.  True or False 4. Canned Vegetables have less sodium than froze True or False  WAYS TO DECREASE YOUR SODIUM INTAKE 1. Avoid the use of added salt in cooking and at the table.  Table salt (and other prepared seasonings which contain salt) is probably one of the greatest sources of sodium in the diet.  Unsalted foods can gain flavor from the sweet, sour, and butter taste sensations of herbs and spices.  Instead of using salt for seasoning, try the following seasonings with the foods listed.  Remember: how you use them to enhance natural food flavors is limited only by your creativity... Allspice-Meat, fish, eggs, fruit, peas, red and yellow vegetables Almond Extract-Fruit baked goods Anise Seed-Sweet breads, fruit, carrots, beets, cottage cheese, cookies (tastes like licorice) Basil-Meat, fish, eggs, vegetables, rice, vegetables salads, soups, sauces Bay Leaf-Meat, fish, stews, poultry  Burnet-Salad, vegetables (cucumber-like flavor) Caraway Seed-Bread, cookies, cottage cheese, meat, vegetables, cheese, rice Cardamon-Baked goods, fruit, soups Celery Powder or seed-Salads, salad dressings, sauces, meatloaf, soup, bread.Do not use  celery salt Chervil-Meats, salads, fish, eggs, vegetables, cottage cheese (parsley-like flavor) Chili Power-Meatloaf, chicken cheese, corn, eggplant,  egg dishes Chives-Salads cottage cheese, egg dishes, soups, vegetables, sauces Cilantro-Salsa, casseroles Cinnamon-Baked goods, fruit, pork, lamb, chicken, carrots Cloves-Fruit, baked goods, fish, pot roast, green beans, beets, carrots Coriander-Pastry, cookies, meat, salads, cheese (lemon-orange flavor) Cumin-Meatloaf, fish,cheese, eggs, cabbage,fruit pie (caraway flavor) [employer], fruit, eggs, fish, poultry, cottage cheese, vegetables Dill Seed-Meat, cottage cheese, poultry, vegetables, fish, salads, bread Fennel Seed-Bread, cookies, apples, pork, eggs, fish, beets, cabbage, cheese, Licorice-like flavor Garlic-(buds or powder) Salads, meat, poultry, fish, bread, butter, vegetables, potatoes.Do not  use garlic salt Ginger-Fruit, vegetables, baked goods, meat, fish, poultry Horseradish Root-Meet, vegetables, butter Lemon Juice or Extract-Vegetables, fruit, tea, baked goods, fish salads Mace-Baked goods fruit, vegetables, fish, poultry (taste like nutmeg) Maple Extract-Syrups Marjoram-Meat, chicken, fish, vegetables, breads, green salads (taste like Sage) Mint-Tea, lamb, sherbet, vegetables, desserts, carrots, cabbage Mustard, Dry or Seed-Cheese, eggs, meats, vegetables, poultry Nutmeg-Baked goods, fruit, chicken, eggs, vegetables, desserts Onion Powder-Meat, fish, poultry, vegetables, cheese, eggs, bread, rice salads (Do not use   Onion salt) Orange Extract-Desserts, baked goods Oregano-Pasta, eggs, cheese, onions, pork, lamb, fish, chicken, vegetables, green salads Paprika-Meat, fish, poultry, eggs, cheese, vegetables Parsley Flakes-Butter, vegetables, meat fish, poultry, eggs, bread, salads (certain forms may   Contain sodium Pepper-Meat fish, poultry, vegetables, eggs Peppermint Extract-Desserts, baked goods Poppy Seed-Eggs, bread, cheese, fruit dressings, baked goods, noodles, vegetables, cottage  [employer], poultry, meat, fish,  cauliflower, turnips,eggs bread Saffron-Rice, bread, veal, chicken, fish, eggs Sage-Meat, fish, poultry, onions, eggplant, tomateos, pork, stews Savory-Eggs, salads, poultry, meat, rice, vegetables, soups, pork Tarragon-Meat, poultry, fish, eggs, butter, vegetables (licorice-like flavor)  Thyme-Meat, poultry, fish, eggs, vegetables, (clover-like flavor), sauces, soups Tumeric-Salads, butter, eggs, fish, rice, vegetables (saffron-like flavor) Vanilla Extract-Baked goods, candy Vinegar-Salads, vegetables, meat marinades Walnut Extract-baked goods, candy  2. Choose your Foods Wisely   The following is a list of foods to avoid which are high in sodium:  Meats-Avoid all smoked, canned, salt cured, dried and kosher meat and fish as well as Anchovies   [employer] meats:Bologna, Liverwurst, Pastrami Canned meat or fish  Marinated herring Caviar    Pepperoni Corned Beef   Pizza Dried chipped beef  Salami Frozen breaded fish or meat Salt pork Frankfurters or hot dogs  Sardines Gefilte fish   Sausage Ham (boiled ham, Proscuitto Smoked butt    spiced ham)   Spam      TV Dinners Vegetables Canned vegetables (Regular) Relish Canned mushrooms  Sauerkraut Olives    Tomato juice Pickles  Bakery and Dessert Products Canned puddings  Cream pies Cheesecake   Decorated cakes Cookies  Beverages/Juices Tomato juice, regular  Gatorade   V-8 vegetable juice, regular  Breads and Cereals Biscuit mixes   Salted potato chips, corn chips, pretzels Bread stuffing mixes  Salted crackers and rolls Pancake and waffle mixes Self-rising flour  Seasonings Accent    Meat sauces Barbecue sauce  Meat tenderizer Catsup    Monosodium glutamate (MSG) Celery salt   Onion salt Chili sauce   Prepared mustard Garlic salt   Salt, seasoned salt, sea salt Gravy mixes   Soy sauce Horseradish   Steak sauce Ketchup   Tartar sauce Lite salt    Teriyaki sauce Marinade mixes   [employer]  sauce  Others Baking powder   Cocoa and cocoa mixes Baking soda   Commercial casserole mixes Candy-caramels, chocolate  Dehydrated soups    Bars, fudge,nougats  Instant rice and pasta mixes Canned broth or soup  Maraschino cherries Cheese, aged and processed cheese and cheese spreads  Learning Assessment Quiz  Indicated T (for True) or F (for False) for each of the following statements:  1. _____ Fresh fruits and vegetables and unprocessed grains are generally low in sodium 2. _____ Water may contain a considerable amount of sodium, depending on the source 3. _____ You can always tell if a food is high in sodium by tasting it 4. _____ Certain laxatives my be high in sodium and should be avoided unless prescribed   by a physician or pharmacist 5. _____ Salt substitutes may be used freely by anyone on a sodium restricted diet 6. _____ Sodium is present in table salt, food additives and as a natural component of   most foods 7. _____ Table salt is approximately 90% sodium 8. _____ Limiting sodium intake may help prevent excess fluid accumulation in the body 9. _____ On a sodium-restricted diet, seasonings such as bouillon soy sauce, and    cooking wine should be used in place of table salt 10. _____ On an ingredient list, a product which lists monosodium glutamate as the first   ingredient is an appropriate food to include on a low sodium diet  Circle the best answer(s) to the following statements (Hint: there may be more than one correct answer)  11. On a low-sodium diet, some acceptable snack items are:    A. Olives  F. Bean dip   K. Grapefruit juice    B. Salted Pretzels G. Commercial Popcorn   L. Canned peaches    C. Carrot Sticks  H. Bouillon   M. Unsalted nuts   D. [place] fries  I. Peanut butter crackers N. Salami   E. Sweet pickles J. Tomato Juice   O. Pizza  12.  Seasonings that may be used freely on a reduced - sodium diet include   A. Lemon wedges F.Monosodium  glutamate K. Celery seed    B.Soysauce   G. Pepper   L. Mustard powder   C. Sea salt  H. Cooking wine  M. Onion flakes   D. Vinegar  E. Prepared horseradish N. Salsa   E. Sage   J. Worcestershire sauce  O. [place]

## 2019-07-21 ENCOUNTER — Emergency Department (HOSPITAL_COMMUNITY)
Admission: EM | Admit: 2019-07-21 | Discharge: 2019-07-21 | Disposition: A | Discharge status: Home / Self Care | Payer: Medicare Other | Attending: Emergency Medicine | Admitting: Emergency Medicine

## 2019-07-21 ENCOUNTER — Other Ambulatory Visit: Payer: Self-pay

## 2019-07-21 ENCOUNTER — Encounter (HOSPITAL_COMMUNITY): Payer: Self-pay

## 2019-07-21 DIAGNOSIS — Z7982 Long term (current) use of aspirin: Secondary | ICD-10-CM | POA: Diagnosis not present

## 2019-07-21 DIAGNOSIS — K219 Gastro-esophageal reflux disease without esophagitis: Secondary | ICD-10-CM | POA: Diagnosis present

## 2019-07-21 DIAGNOSIS — Z87891 Personal history of nicotine dependence: Secondary | ICD-10-CM | POA: Insufficient documentation

## 2019-07-21 DIAGNOSIS — K2100 Gastro-esophageal reflux disease with esophagitis, without bleeding: Secondary | ICD-10-CM | POA: Diagnosis not present

## 2019-07-21 DIAGNOSIS — E119 Type 2 diabetes mellitus without complications: Secondary | ICD-10-CM | POA: Diagnosis not present

## 2019-07-21 DIAGNOSIS — Z79899 Other long term (current) drug therapy: Secondary | ICD-10-CM | POA: Insufficient documentation

## 2019-07-21 DIAGNOSIS — I10 Essential (primary) hypertension: Secondary | ICD-10-CM | POA: Diagnosis not present

## 2019-07-21 DIAGNOSIS — J449 Chronic obstructive pulmonary disease, unspecified: Secondary | ICD-10-CM | POA: Diagnosis not present

## 2019-07-21 MED ORDER — PANTOPRAZOLE SODIUM 20 MG PO TBEC: 20.0000 mg | DELAYED_RELEASE_TABLET | Freq: Every day | ORAL | 0 refills | Status: DC

## 2019-07-21 NOTE — ED Provider Notes (Signed)
Pt has known severe esophagitis - had a hot dog today and then had bad burning in chest and throat - feeling better by the time of arrival - states it feels like her GERD - she is supposed to take her Omeprazole twice daily - not great about it - reviewed EMR showing that she had endoscopy in 1/21, showing above findings - pt is declining any other interventions or testing at this time, she did accetp EKG which is non ischemic.  Pt given instructions that she could return at any time should she change her mind.  ED ECG REPORT  I personally interpreted this EKG   Date: 07/22/2019   Rate: 82  Rhythm: normal sinus rhythm  QRS Axis: normal  Intervals: PR prolonged  ST/T Wave abnormalities: normal  Conduction Disutrbances:none  Narrative Interpretation:   Old EKG Reviewed: unchanged from 02/19/19  Medical screening examination/treatment/procedure(s) were conducted as a shared visit with non-physician practitioner(s) and myself.  I personally evaluated the patient during the encounter.  Clinical Impression:   Final diagnoses:  Gastroesophageal reflux disease with esophagitis without hemorrhage          Noemi Chapel, MD 07/22/19 1139

## 2019-07-21 NOTE — ED Provider Notes (Signed)
[facility] EMERGENCY DEPARTMENT Provider Note   CSN: [id number] Arrival date & time: 07/21/19  2142     History Chief Complaint  Patient presents with  . Gastroesophageal Reflux   HPI  [name] is a 69 y.o. female with a longstanding history of GERD, paroxysmal A. fib, hepatic cirrhosis, chronic cholecystitis, prior H. pylori infection and dysphagia who presents to the emergency room with complaints of worsening GERD symptoms. The onset of her symptoms was gradual, located mainly in her throat and chest, has been ongoing for the last 4 to 6 hours, described as burning. She has associated nausea and one episode of nonbilious, nonbloody vomiting today. She refers that she ate a hot dog for breakfast which seemed to have exacerbated her symptoms.  She normally does not eat these.  She has been able to tolerate fluids well.  She denies palpitations, shortness of breath, back pain/shoulder pain, abdominal pain, diarrhea, hematemesis, frequent or painful urination.  She is following with Dr. Cecille Rubin with [facility], had an EGD performed in January 2021 which found GERD with severe esophagitis with geographic ulcerations and a medium sized hiatal hernia.  She reports taking her omeprazole as directed.    Past Medical History:  Diagnosis Date  . Anterolisthesis    Grade 1, L4-5  . Bronchospasm 05/28/2012  . COPD (chronic obstructive pulmonary disease) ([place])   . DEGENERATIVE JOINT DISEASE 10/06/2006  . DEPRESSION 09/26/2008  . DIABETES MELLITUS 10/06/2006  . Diverticulosis    [id number]  . GERD (gastroesophageal reflux disease) 07/25/2013  . Glaucoma   . HOCM (hypertrophic obstructive cardiomyopathy) ([place])   . HYPERLIPIDEMIA 01/11/2009  . HYPERTENSION 10/06/2006  . Hypokalemia   . Hypomagnesemia   . INSOMNIA 09/26/2008  . Internal hemorrhoids   . OBSTRUCTIVE SLEEP APNEA 06/23/2008   Severe OSA per sleep study 2010, Rx a CPAP  . Pain in joint, multiple sites 11/10/2006  .  Pericardial effusion   . Pericarditis   . UNSPECIFIED ANEMIA 12/10/2009  . UTI (urinary tract infection) 11/2017    Patient Active Problem List   Diagnosis Date Noted  . Loss of weight 04/28/2019  . Paroxysmal atrial fibrillation ([place]) 03/01/2019  . Multinodular goiter 01/26/2019  . Regurgitation of food 01/21/2019  . Calculus of gallbladder without cholecystitis without obstruction 01/21/2019  . Numbness and tingling in both hands 12/25/2018  . Chronic cholecystitis with calculus 12/16/2018  . Abnormal LFTs 12/16/2018  . Abnormal weight loss 12/16/2018  . Upper abdominal pain   . Elevated transaminase level   . Hepatic cirrhosis ([place])   . Nausea and vomiting in adult   . Acute cholecystitis 12/09/2018  . Pyrosis 03/12/2018  . History of Helicobacter pylori infection 02/01/2018  . Dysphagia 02/01/2018  . Anxiety and depression   . Hypothyroidism 12/22/2017  . ILD (interstitial lung disease) ([place]) 11/25/2017  . Chronic respiratory failure with hypoxia ([place]) 11/12/2017  . Pericardial effusion   . Paresthesia 10/19/2017  . Neck pain 10/19/2017  . PCP NOTES >>> 02/23/2015  . Nocturnal oxygen desaturation 01/02/2015  . Asthma, chronic 01/02/2015  . Gastroesophageal reflux disease 07/25/2013  . DOE (dyspnea on exertion) 04/25/2013  . Internal hemorrhoids without mention of complication [id number]  . Medicare annual wellness visit, subsequent 06/06/2011  . Anemia 12/10/2009  . SKIN LESION 08/06/2009  . Hyperlipidemia 01/11/2009  . Depression 09/26/2008  . INSOMNIA 09/26/2008  . OBSTRUCTIVE SLEEP APNEA 06/23/2008  . Chest pain 11/18/2007  . Type 2 diabetes mellitus without complication, without long-term  current use of insulin ([place]) 10/06/2006  . Essential hypertension 10/06/2006  . Osteoarthritis 10/06/2006    Past Surgical History:  Procedure Laterality Date  . BIOPSY  12/28/2017   Procedure: BIOPSY;  Surgeon: Rush Landmark Telford Nab., MD;  Location: [place];   Service: Gastroenterology;;  . BIOPSY  05/19/2019   Procedure: BIOPSY;  Surgeon: Daneil Dolin, MD;  Location[facility];  Service: Endoscopy;;  gastric  . COLONOSCOPY  08/01/2011   Procedure: COLONOSCOPY;  Surgeon: Inda Castle, MD;  Location: [facility];  Service: Endoscopy;  Laterality: N/A;. hyperplastic polyp removed, hemorrhoids s/p banding, diverticulosis, next tcs 10 years.   . ESOPHAGOGASTRODUODENOSCOPY (EGD) WITH PROPOFOL N/A 12/28/2017   Procedure: ESOPHAGOGASTRODUODENOSCOPY (EGD) WITH PROPOFOL;  Surgeon: Rush Landmark Telford Nab., MD;  Location: [place];  Service: Gastroenterology;  Laterality: N/A; h.pylori gastiris, tortuous esophagus with esophageal bx (no EOE), medium sized hiatal hernia  . ESOPHAGOGASTRODUODENOSCOPY (EGD) WITH PROPOFOL N/A 05/19/2019   Procedure: ESOPHAGOGASTRODUODENOSCOPY (EGD) WITH PROPOFOL;  Surgeon: Daneil Dolin, MD;  [facility];  Service: Endoscopy;  Laterality: N/A;  8:30AM  . LEFT HEART CATH AND CORONARY ANGIOGRAPHY N/A 10/22/2017   Procedure: LEFT HEART CATH AND CORONARY ANGIOGRAPHY;  Surgeon: Jettie Booze, MD;  Location: [place] [facility];  Service: Cardiovascular;  Laterality: N/A;  . 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 Right knee replacement  2005     OB History   No obstetric history on file.     Family History  Problem Relation Age of Onset  . Asthma Mother   . Stroke Mother   . Diabetes Mother   . Diabetes Other        M, B, S  . Hypertension Sister        M, S,B  . Pancreatic cancer Brother   . Colon cancer Neg Hx   . Prostate cancer Neg Hx   . Breast cancer Neg Hx   . Esophageal cancer Neg Hx   . Liver disease Neg Hx   . Rectal cancer Neg Hx   . Stomach cancer Neg Hx   . Inflammatory bowel disease Neg Hx     Social History   Tobacco Use  . Smoking status: Former Smoker    Packs/day: 0.20    Years: 4.00    Pack years: 0.80    Types: Cigarettes    Quit date: 05/13/1995    Years since quitting:  24.2  . Smokeless tobacco: Never Used  Substance Use Topics  . Alcohol use: Not Currently  . Drug use: No    Home Medications Prior to Admission medications   Medication Sig Start Date End Date Taking? Authorizing Provider  Accu-Chek Softclix Lancets lancets USE TO CHECK BLOOD SUGAR TWICE A DAY 10/28/18   Colon Branch, MD  albuterol (VENTOLIN HFA) 108 (90 Base) MCG/ACT inhaler Inhale 2 puffs into the lungs every 4 (four) hours as needed for wheezing or shortness of breath. 10/28/18   Colon Branch, MD  alum & mag hydroxide-simeth (MAALOX/MYLANTA) 200-200-20 MG/5ML suspension Take 30 mLs by mouth every 4 (four) hours as needed for indigestion. 12/11/18   Manuella Ghazi, Pratik D, DO  amLODipine (NORVASC) 10 MG tablet Take 1 tablet (10 mg total) by mouth daily. 08/12/18 08/07/19  Sueanne Margarita, MD  ARIPiprazole (ABILIFY) 5 MG tablet Take 1 tablet by mouth daily. 02/10/19   [provider]  aspirin EC 81 MG tablet Take 1 tablet (81 mg total) by mouth daily. 08/13/18  Colon Branch, MD  carvedilol (COREG) 25 MG tablet Take 1 tablet (25 mg total) by mouth 2 (two) times daily. Patient taking differently: Take 25 mg by mouth daily.  12/24/18 05/26/19  Charlie Pitter, PA-C  Cholecalciferol 50 MCG (2000 UT) CAPS Take 1 capsule (2,000 Units total) by mouth daily with breakfast. 05/24/19   Nida, Marella Chimes, MD  escitalopram (LEXAPRO) 20 MG tablet TAKE 1 TABLET BY MOUTH  DAILY Patient taking differently: Take 20 mg by mouth daily.  03/11/19   Corum, Rex Kras, MD  Fluticasone-Salmeterol (WIXELA INHUB) 250-50 MCG/DOSE AEPB Inhale 1 puff into the lungs 2 (two) times daily. 10/28/18   [provider]  furosemide (LASIX) 20 MG tablet Take 1 tablet (20 mg total) by mouth daily. 06/28/19   Imogene Burn, PA-C  glucose blood (ACCU-CHEK AVIVA PLUS) test strip TEST BLOOD SUGAR TWICE DAILY AND LANCETS TWICE DAILY E11.9 10/27/18   Colon Branch, MD  latanoprost (XALATAN) 0.005 % ophthalmic solution Place 1 drop into both  eyes at bedtime.    [provider]  levothyroxine (SYNTHROID) 88 MCG tablet Take 1 tablet (88 mcg total) by mouth daily before breakfast. 04/28/19   Nida, Marella Chimes, MD  omeprazole (PRILOSEC) 40 MG capsule Take 1 capsule (40 mg total) by mouth 2 (two) times daily before lunch and supper. 02/21/19   Erenest Rasher, PA-C  ondansetron (ZOFRAN) 4 MG tablet Take 1 tablet (4 mg total) by mouth every 6 (six) hours as needed for nausea. 12/11/18   Manuella Ghazi, Pratik D, DO  OVER THE COUNTER MEDICATION CPAP: At bedtime    [provider]  OXYGEN Inhale 2 L into the lungs daily as needed (shortness of breath).     [provider]  pantoprazole (PROTONIX) 20 MG tablet Take 1 tablet (20 mg total) by mouth daily. 07/21/19   Garald Balding, PA-C  potassium chloride SA (K-DUR) 20 MEQ tablet Take 1 tablet (20 mEq total) by mouth 2 (two) times daily. 11/30/18   Colon Branch, MD  rosuvastatin (CRESTOR) 40 MG tablet TAKE 1 TABLET BY MOUTH IN  THE EVENING Patient taking differently: Take 40 mg by mouth at bedtime.  03/11/19   CorumRex Kras, MD    Allergies    Patient has no known allergies.  Review of Systems   Review of Systems  HENT: Negative for sore throat and trouble swallowing.   Respiratory: Negative for shortness of breath.   Cardiovascular: Positive for chest pain.  Gastrointestinal: Positive for nausea and vomiting. Negative for diarrhea.  All other systems reviewed and are negative.   Physical Exam Updated Vital Signs BP (!) 145/90   Pulse 71   Temp 97.9 F (36.6 C) (Oral)   Resp 20   SpO2 99%   Physical Exam Vitals reviewed.  Constitutional:      Appearance: Normal appearance. She is obese.  HENT:     Mouth/Throat:     Mouth: Mucous membranes are moist.     Pharynx: Oropharynx is clear.  Eyes:     Extraocular Movements: Extraocular movements intact.     Pupils: Pupils are equal, round, and reactive to light.  Cardiovascular:     Rate and Rhythm: Normal  rate and regular rhythm.     Pulses: Normal pulses.     Heart sounds: Normal heart sounds.  Pulmonary:     Effort: Pulmonary effort is normal.     Breath sounds: Normal breath sounds.  Abdominal:  General: Abdomen is flat. Bowel sounds are normal.     Palpations: Abdomen is soft.     Tenderness: There is no abdominal tenderness. There is no guarding.  Musculoskeletal:        General: Normal range of motion.     Cervical back: Normal range of motion and neck supple. No tenderness.  Skin:    General: Skin is warm and dry.  Neurological:     General: No focal deficit present.     Mental Status: She is alert.  Psychiatric:        Mood and Affect: Mood normal.        Behavior: Behavior normal.     ED Results / Procedures / Treatments   Labs (all labs ordered are listed, but only abnormal results are displayed) Labs Reviewed - No data to display  EKG None  Radiology No results found.  Procedures Procedures (including critical care time)  Medications Ordered in ED Medications - No data to display  ED Course  I have reviewed the triage vital signs and the nursing notes.  Pertinent labs & imaging results that were available during my care of the patient were reviewed by me and considered in my medical decision making (see chart for details).    MDM Rules/Calculators/A&P 69 year old female with longstanding history of GERD and esophagitis presents to the ED with worsening GERD symptoms. The patient appears comfortable, in no acute distress.  No abdominal tenderness on exam, lung sounds clear, regular rate and rhythm. She is mildly hypertensive with BP 145/90, but other vital signs are normal.  History was gathered by myself and Dr. Sabra Heck.  Differential diagnosis includes GERD flareup, ACS, acute cholecystitis/cholangitis.  Discussed care with Dr. Sabra Heck, no labs or imaging indicated given mild presentation of patient's symptoms and improvement upon arrival to ED.  EKG  reviewed by Dr. Sabra Heck. She reports that she is feeling better in the ED and wishes to go home as soon as possible. Offered GI cocktail, patient refused.  Discussed nutritional choices with patient along with following up with GI physician in the next few days. Patient is agreeable to this plan.  We will add Protonix 20 mg daily to already prescribed omeprazole for better symptom management of GERD symptoms. The patient appears reasonably screened and/or stabilized for discharge and I doubt any other medical condition or other [facility] requiring further screening, evaluation, or treatment in the ED at this time prior to discharge.I have discussed return precautions with the patient.    Final Clinical Impression(s) / ED Diagnoses Final diagnoses:  Gastroesophageal reflux disease with esophagitis without hemorrhage    Rx / DC Orders ED Discharge Orders         Ordered    pantoprazole (PROTONIX) 20 MG tablet  Daily     07/21/19 2246           Lyndel Safe 07/21/19 2316    Noemi Chapel, MD 07/22/19 1139

## 2019-07-21 NOTE — ED Triage Notes (Signed)
Pt reports acid reflux pain x 1 week, worse tonight.  Pt denies she ate anything to aggravate it tonight.  Pt points to upper abd and throat where she is having pain.

## 2019-07-21 NOTE — Discharge Instructions (Addendum)
You were seen in the emergency department for a flareup of your GERD symptoms.  We reviewed your EKG which was normal.  We are sending you home with an additional medication called Protonix which will help your GERD symptoms on top of the omeprazole that you are taking every day.  It is important to watch what you eat to prevent further flareups.  Please make sure to follow-up with your GI doctors within the next few days.  Return to the ED if your symptoms worsen.

## 2019-07-23 ENCOUNTER — Ambulatory Visit: Payer: Medicare Other | Attending: Internal Medicine

## 2019-07-23 DIAGNOSIS — Z23 Encounter for immunization: Secondary | ICD-10-CM

## 2019-07-23 NOTE — Progress Notes (Signed)
   Covid-19 Vaccination Clinic  Name:  [name]    MRN: [id number] DOB: 1951/01/16  07/23/2019  [name] was observed post Covid-19 immunization for 30 minutes based on pre-vaccination screening without incident. She was provided with Vaccine Information Sheet and instruction to access the V-Safe system.   [name] was instructed to call 911 with any severe reactions post vaccine: Marland Kitchen Difficulty breathing  . Swelling of face and throat  . A fast heartbeat  . A bad rash all over body  . Dizziness and weakness   Immunizations Administered    Name Date Dose VIS Date Route   Moderna COVID-19 Vaccine 07/23/2019 10:57 AM 0.5 mL 04/12/2019 Intramuscular   Manufacturer: Moderna   Lot: [id number]   [id number]

## 2019-07-26 ENCOUNTER — Telehealth: Payer: Self-pay

## 2019-07-26 NOTE — Telephone Encounter (Signed)
Spoke with patient who is aware that she has the genetic variant for HOCM. I advised her that her siblings and children should have an echocardiogram done and should be reassessed every 5 years. Patient verbalized understanding and states that she will let them all know.  She will call the office back to set up an appointment with Dr. Lovena Le.

## 2019-07-26 NOTE — Telephone Encounter (Signed)
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  Please get her an appt to followup with Dr. Lovena Le in April - it is on the recall but has not been made yet  [place]

## 2019-07-27 ENCOUNTER — Ambulatory Visit (INDEPENDENT_AMBULATORY_CARE_PROVIDER_SITE_OTHER): Payer: Medicare Other | Admitting: Family Medicine

## 2019-07-27 VITALS — BP 100/70 | HR 85 | Temp 97.9°F | Ht 69.0 in | Wt 154.6 lb

## 2019-07-27 DIAGNOSIS — J9611 Chronic respiratory failure with hypoxia: Secondary | ICD-10-CM

## 2019-07-28 ENCOUNTER — Encounter (HOSPITAL_COMMUNITY): Payer: Self-pay | Admitting: Emergency Medicine

## 2019-07-28 ENCOUNTER — Emergency Department (HOSPITAL_COMMUNITY)
Admission: EM | Admit: 2019-07-28 | Discharge: 2019-07-28 | Disposition: A | Discharge status: Home / Self Care | Payer: Medicare Other | Attending: Emergency Medicine | Admitting: Emergency Medicine

## 2019-07-28 ENCOUNTER — Other Ambulatory Visit: Payer: Self-pay

## 2019-07-28 DIAGNOSIS — I10 Essential (primary) hypertension: Secondary | ICD-10-CM | POA: Diagnosis not present

## 2019-07-28 DIAGNOSIS — K2950 Unspecified chronic gastritis without bleeding: Secondary | ICD-10-CM | POA: Insufficient documentation

## 2019-07-28 DIAGNOSIS — Z87891 Personal history of nicotine dependence: Secondary | ICD-10-CM | POA: Insufficient documentation

## 2019-07-28 DIAGNOSIS — Z79899 Other long term (current) drug therapy: Secondary | ICD-10-CM | POA: Insufficient documentation

## 2019-07-28 DIAGNOSIS — E119 Type 2 diabetes mellitus without complications: Secondary | ICD-10-CM | POA: Insufficient documentation

## 2019-07-28 DIAGNOSIS — J449 Chronic obstructive pulmonary disease, unspecified: Secondary | ICD-10-CM | POA: Insufficient documentation

## 2019-07-28 DIAGNOSIS — Z7982 Long term (current) use of aspirin: Secondary | ICD-10-CM | POA: Insufficient documentation

## 2019-07-28 DIAGNOSIS — J45909 Unspecified asthma, uncomplicated: Secondary | ICD-10-CM | POA: Diagnosis not present

## 2019-07-28 DIAGNOSIS — R1084 Generalized abdominal pain: Secondary | ICD-10-CM

## 2019-07-28 DIAGNOSIS — E039 Hypothyroidism, unspecified: Secondary | ICD-10-CM | POA: Diagnosis not present

## 2019-07-28 DIAGNOSIS — R109 Unspecified abdominal pain: Secondary | ICD-10-CM | POA: Diagnosis present

## 2019-07-28 MED ORDER — ALUM & MAG HYDROXIDE-SIMETH 200-200-20 MG/5ML PO SUSP
30.0000 mL | Freq: Once | ORAL | Status: AC
  Administered 2019-07-28: 30 mL via ORAL
  Filled 2019-07-28: qty 30

## 2019-07-28 MED ORDER — SUCRALFATE 1 G PO TABS: 1.0000 g | ORAL_TABLET | Freq: Three times a day (TID) | ORAL | 0 refills | Status: DC

## 2019-07-28 MED ORDER — LIDOCAINE VISCOUS HCL 2 % MT SOLN
15.0000 mL | Freq: Once | OROMUCOSAL | Status: AC
  Administered 2019-07-28: 16:00:00 15 mL via ORAL
  Filled 2019-07-28: qty 15

## 2019-07-28 NOTE — ED Triage Notes (Addendum)
Pt c/o of acid reflux, n/v, epigastric pain and bilateral legs swelling. Pt states "this has been going on for years"

## 2019-07-28 NOTE — ED Provider Notes (Signed)
[facility] EMERGENCY DEPARTMENT Provider Note   CSN: [id number] Arrival date & time: 07/28/19  1504     History Chief Complaint  Patient presents with  . Abdominal Pain    [name] is a 69 y.o. female.  HPI HPI Comments: [name] is a 69 y.o. female with a history of resolved type 2 diabetes mellitus, GERD, esophagitis, COPD, dysphagia, cirrhosis, weight loss who presents to the Emergency Department complaining of moderate, diffuse, abdominal pain x3 days.  Patient was seen 1 week ago with similar symptoms.  She had a normal ECG performed and states that her symptoms spontaneously resolved and she then asked to leave the emergency department before receiving any intervention.  She had an upper endoscopy performed on May 19, 2019 by her gastroenterologist Dr. Gala Romney showing signs of GERD and esophagitis.  She states that she has been taking Mylanta daily as well as Protonix and denies any relief with Protonix.  She notes moderate relief with Mylanta.  She reports associated increased belching, diffuse burning chest pain that is worse when lying flat at night, diarrhea by 2 weeks, nausea and vomiting following meals.  She additionally reports chronic shortness of breath which she denies has acutely changed as well as chronic leg swelling.  She is on 20 mg of Lasix daily.  She ambulates with a walker at baseline.  She lives alone but states that she has family in the area that frequently visits her.  She denies any alcohol use for the past 4 years and she was never a smoker.  She states her history of diabetes mellitus has resolved due to her uncontrolled weight loss.  She still measures her blood sugars daily which she states are typically in the 90s.  She denies fevers, chills, URI symptoms, urinary symptoms, dizziness, lightheadedness, syncope.    Past Medical History:  Diagnosis Date  . Anterolisthesis    Grade 1, L4-5  . Bronchospasm 05/28/2012  . COPD (chronic  obstructive pulmonary disease) ([place])   . DEGENERATIVE JOINT DISEASE 10/06/2006  . DEPRESSION 09/26/2008  . DIABETES MELLITUS 10/06/2006  . Diverticulosis    [id number]  . GERD (gastroesophageal reflux disease) 07/25/2013  . Glaucoma   . HOCM (hypertrophic obstructive cardiomyopathy) ([place])   . HYPERLIPIDEMIA 01/11/2009  . HYPERTENSION 10/06/2006  . Hypokalemia   . Hypomagnesemia   . INSOMNIA 09/26/2008  . Internal hemorrhoids   . OBSTRUCTIVE SLEEP APNEA 06/23/2008   Severe OSA per sleep study 2010, Rx a CPAP  . Pain in joint, multiple sites 11/10/2006  . Pericardial effusion   . Pericarditis   . UNSPECIFIED ANEMIA 12/10/2009  . UTI (urinary tract infection) 11/2017    Patient Active Problem List   Diagnosis Date Noted  . Loss of weight 04/28/2019  . Paroxysmal atrial fibrillation ([place]) 03/01/2019  . Multinodular goiter 01/26/2019  . Regurgitation of food 01/21/2019  . Calculus of gallbladder without cholecystitis without obstruction 01/21/2019  . Numbness and tingling in both hands 12/25/2018  . Chronic cholecystitis with calculus 12/16/2018  . Abnormal LFTs 12/16/2018  . Abnormal weight loss 12/16/2018  . Upper abdominal pain   . Elevated transaminase level   . Hepatic cirrhosis ([place])   . Nausea and vomiting in adult   . Acute cholecystitis 12/09/2018  . Pyrosis 03/12/2018  . History of Helicobacter pylori infection 02/01/2018  . Dysphagia 02/01/2018  . Anxiety and depression   . Hypothyroidism 12/22/2017  . ILD (interstitial lung disease) ([place]) 11/25/2017  . Chronic respiratory failure  with hypoxia ([place]) 11/12/2017  . Pericardial effusion   . Paresthesia 10/19/2017  . Neck pain 10/19/2017  . PCP NOTES >>> 02/23/2015  . Nocturnal oxygen desaturation 01/02/2015  . Asthma, chronic 01/02/2015  . Gastroesophageal reflux disease 07/25/2013  . DOE (dyspnea on exertion) 04/25/2013  . Internal hemorrhoids without mention of complication [id number]  . Medicare annual wellness  visit, subsequent 06/06/2011  . Anemia 12/10/2009  . SKIN LESION 08/06/2009  . Hyperlipidemia 01/11/2009  . Depression 09/26/2008  . INSOMNIA 09/26/2008  . OBSTRUCTIVE SLEEP APNEA 06/23/2008  . Chest pain 11/18/2007  . Type 2 diabetes mellitus without complication, without long-term current use of insulin ([place]) 10/06/2006  . Essential hypertension 10/06/2006  . Osteoarthritis 10/06/2006    Past Surgical History:  Procedure Laterality Date  . BIOPSY  12/28/2017   Procedure: BIOPSY;  Surgeon: Rush Landmark Telford Nab., MD;  Location: [place];  Service: Gastroenterology;;  . BIOPSY  05/19/2019   Procedure: BIOPSY;  Surgeon: Daneil Dolin, MD;  [facility];  Service: Endoscopy;;  gastric  . COLONOSCOPY  08/01/2011   Procedure: COLONOSCOPY;  Surgeon: Inda Castle, MD;  Location: [facility];  Service: Endoscopy;  Laterality: N/A;. hyperplastic polyp removed, hemorrhoids s/p banding, diverticulosis, next tcs 10 years.   . ESOPHAGOGASTRODUODENOSCOPY (EGD) WITH PROPOFOL N/A 12/28/2017   Procedure: ESOPHAGOGASTRODUODENOSCOPY (EGD) WITH PROPOFOL;  Surgeon: Rush Landmark Telford Nab., MD;  Location: [place];  Service: Gastroenterology;  Laterality: N/A; h.pylori gastiris, tortuous esophagus with esophageal bx (no EOE), medium sized hiatal hernia  . ESOPHAGOGASTRODUODENOSCOPY (EGD) WITH PROPOFOL N/A 05/19/2019   Procedure: ESOPHAGOGASTRODUODENOSCOPY (EGD) WITH PROPOFOL;  Surgeon: Daneil Dolin, MD;  [facility];  Service: Endoscopy;  Laterality: N/A;  8:30AM  . LEFT HEART CATH AND CORONARY ANGIOGRAPHY N/A 10/22/2017   Procedure: LEFT HEART CATH AND CORONARY ANGIOGRAPHY;  Surgeon: Jettie Booze, MD;  Location: [place] [facility];  Service: Cardiovascular;  Laterality: N/A;  . 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 Right knee replacement  2005     OB History   No obstetric history on file.     Family History  Problem Relation Age of Onset  . Asthma Mother   .  Stroke Mother   . Diabetes Mother   . Diabetes Other        M, B, S  . Hypertension Sister        M, S,B  . Pancreatic cancer Brother   . Colon cancer Neg Hx   . Prostate cancer Neg Hx   . Breast cancer Neg Hx   . Esophageal cancer Neg Hx   . Liver disease Neg Hx   . Rectal cancer Neg Hx   . Stomach cancer Neg Hx   . Inflammatory bowel disease Neg Hx     Social History   Tobacco Use  . Smoking status: Former Smoker    Packs/day: 0.20    Years: 4.00    Pack years: 0.80    Types: Cigarettes    Quit date: 05/13/1995    Years since quitting: 24.2  . Smokeless tobacco: Never Used  Substance Use Topics  . Alcohol use: Not Currently  . Drug use: No    Home Medications Prior to Admission medications   Medication Sig Start Date End Date Taking? Authorizing Provider  Accu-Chek Softclix Lancets lancets USE TO CHECK BLOOD SUGAR TWICE A DAY 10/28/18   Colon Branch, MD  albuterol (VENTOLIN HFA) 108 (90 Base) MCG/ACT inhaler Inhale 2 puffs into  the lungs every 4 (four) hours as needed for wheezing or shortness of breath. 10/28/18   Colon Branch, MD  alum & mag hydroxide-simeth (MAALOX/MYLANTA) 200-200-20 MG/5ML suspension Take 30 mLs by mouth every 4 (four) hours as needed for indigestion. 12/11/18   Manuella Ghazi, Pratik D, DO  amLODipine (NORVASC) 10 MG tablet Take 1 tablet (10 mg total) by mouth daily. 08/12/18 08/07/19  Sueanne Margarita, MD  ARIPiprazole (ABILIFY) 5 MG tablet Take 1 tablet by mouth daily. 02/10/19   [provider]  aspirin EC 81 MG tablet Take 1 tablet (81 mg total) by mouth daily. 08/13/18   Colon Branch, MD  carvedilol (COREG) 25 MG tablet Take 1 tablet (25 mg total) by mouth 2 (two) times daily. Patient taking differently: Take 25 mg by mouth daily.  12/24/18 05/26/19  Charlie Pitter, PA-C  Cholecalciferol 50 MCG (2000 UT) CAPS Take 1 capsule (2,000 Units total) by mouth daily with breakfast. 05/24/19   Nida, Marella Chimes, MD  escitalopram (LEXAPRO) 20 MG tablet TAKE 1 TABLET BY  MOUTH  DAILY Patient taking differently: Take 20 mg by mouth daily.  03/11/19   Corum, Rex Kras, MD  Fluticasone-Salmeterol (WIXELA INHUB) 250-50 MCG/DOSE AEPB Inhale 1 puff into the lungs 2 (two) times daily. 10/28/18   [provider]  furosemide (LASIX) 20 MG tablet Take 1 tablet (20 mg total) by mouth daily. 06/28/19   Imogene Burn, PA-C  glucose blood (ACCU-CHEK AVIVA PLUS) test strip TEST BLOOD SUGAR TWICE DAILY AND LANCETS TWICE DAILY E11.9 10/27/18   Colon Branch, MD  latanoprost (XALATAN) 0.005 % ophthalmic solution Place 1 drop into both eyes at bedtime.    [provider]  levothyroxine (SYNTHROID) 88 MCG tablet Take 1 tablet (88 mcg total) by mouth daily before breakfast. 04/28/19   Nida, Marella Chimes, MD  omeprazole (PRILOSEC) 40 MG capsule Take 1 capsule (40 mg total) by mouth 2 (two) times daily before lunch and supper. 02/21/19   Erenest Rasher, PA-C  ondansetron (ZOFRAN) 4 MG tablet Take 1 tablet (4 mg total) by mouth every 6 (six) hours as needed for nausea. 12/11/18   Manuella Ghazi, Pratik D, DO  OVER THE COUNTER MEDICATION CPAP: At bedtime    [provider]  OXYGEN Inhale 2 L into the lungs daily as needed (shortness of breath).     [provider]  pantoprazole (PROTONIX) 20 MG tablet Take 1 tablet (20 mg total) by mouth daily. 07/21/19   Garald Balding, PA-C  potassium chloride SA (K-DUR) 20 MEQ tablet Take 1 tablet (20 mEq total) by mouth 2 (two) times daily. 11/30/18   Colon Branch, MD  rosuvastatin (CRESTOR) 40 MG tablet TAKE 1 TABLET BY MOUTH IN  THE EVENING Patient taking differently: Take 40 mg by mouth at bedtime.  03/11/19   CorumRex Kras, MD    Allergies    Patient has no known allergies.  Review of Systems   Review of Systems  Constitutional: Positive for activity change, appetite change and unexpected weight change. Negative for chills and fever.  HENT: Positive for rhinorrhea. Negative for congestion.   Respiratory: Positive for  shortness of breath (Chronic). Negative for cough.   Cardiovascular: Positive for chest pain and leg swelling.  Gastrointestinal: Positive for abdominal pain, diarrhea, nausea and vomiting. Negative for constipation.  Genitourinary: Negative for difficulty urinating and dysuria.  Musculoskeletal: Negative for arthralgias, back pain and myalgias.  Neurological: Negative for dizziness, syncope and light-headedness.  All other systems reviewed and are negative.  Physical Exam Updated Vital Signs BP (!) 152/85 (BP Location: Right Arm)   Pulse 90   Temp 98.3 F (36.8 C) (Oral)   Resp 18   Ht 5\' 9"  (1.753 m)   Wt 69.9 kg   SpO2 98%   BMI 22.74 kg/m   Physical Exam Vitals and nursing note reviewed.  Constitutional:      General: She is not in acute distress.    Appearance: She is well-developed. She is not ill-appearing, toxic-appearing or diaphoretic.     Comments: Cachectic elderly African-American female.  She is lying in a semi-Fowlers position.  She can clearly answer questions but sometimes has to have questions repeated multiple times to elicit an answer.  HENT:     Head: Normocephalic and atraumatic.     Mouth/Throat:     Mouth: Mucous membranes are moist.  Eyes:     General: No scleral icterus.    Extraocular Movements: Extraocular movements intact.     Pupils: Pupils are equal, round, and reactive to light.  Cardiovascular:     Rate and Rhythm: Normal rate and regular rhythm.     Heart sounds: Normal heart sounds. No murmur. No friction rub. No gallop.      Comments: Mild TTP noted overlying the sternal region. Pulmonary:     Effort: Pulmonary effort is normal. No respiratory distress.     Breath sounds: Normal breath sounds. No stridor. No wheezing, rhonchi or rales.  Abdominal:     General: Abdomen is protuberant. There is distension.     Tenderness: There is generalized abdominal tenderness. There is no guarding.     Comments: Mild diffuse abdominal distention.   Mild diffuse abdominal tenderness noted.  Abdomen is soft.  No rebound.  Skin:    General: Skin is warm and dry.  Neurological:     General: No focal deficit present.     Mental Status: She is alert and oriented to person, place, and time.  Psychiatric:        Mood and Affect: Mood normal.        Behavior: Behavior normal.    ED Results / Procedures / Treatments   Labs (all labs ordered are listed, but only abnormal results are displayed) Labs Reviewed - No data to display  EKG EKG Interpretation  Date/Time:  Thursday July 28 2019 16:26:05 EDT Ventricular Rate:  78 PR Interval:    QRS Duration: 96 QT Interval:  409 QTC Calculation: 466 R Axis:   20 Text Interpretation: Sinus rhythm since last tracing no significant change Confirmed by Noemi Chapel [phone]) on 07/28/2019 4:31:51 PM   Radiology No results found.  Procedures Procedures (including critical care time)  Medications Ordered in ED Medications  alum & mag hydroxide-simeth (MAALOX/MYLANTA) 200-200-20 MG/5ML suspension 30 mL (30 mLs Oral Given 07/28/19 1625)    And  lidocaine (XYLOCAINE) 2 % viscous mouth solution 15 mL (15 mLs Oral Given 07/28/19 1625)    ED Course  I have reviewed the triage vital signs and the nursing notes.  Pertinent labs & imaging results that were available during my care of the patient were reviewed by me and considered in my medical decision making (see chart for details).    MDM Rules/Calculators/A&P                      4:31 PM Patient is a 69 year old cachectic African-American female who presents with 3 days of diffuse  abdominal pain and intermittent burning chest pain.  She was seen by Dr. Gala Romney her gastroenterologist in January of this year and had an EGD performed showing GERD and esophagitis.  She was seen 1 week ago in the emergency department with similar symptoms which spontaneously resolved and decided to leave before any interventions took place.  She confirms taking  Protonix and Mylanta daily but is unsure if she has taken omeprazole.  Her symptoms seem to be consistent with her history of GERD.  Will give patient GI cocktail.  ECG performed which looks unchanged compared to prior.  Patient discussed with my attending physician Dr. Noemi Chapel who agrees the above plan.  Will reevaluate.  5:18 PM patient states she is feeling much better.  She has no palpable abdominal pain at this time.  She asked to be discharged home.  Will discharge with Carafate and GI follow-up.  She verbalized understanding of this.  Strict return precautions given.  Her questions were answered.  She was amicable to time discharge.  Vital signs stable.  Final Clinical Impression(s) / ED Diagnoses Final diagnoses:  Chronic gastritis without bleeding, unspecified gastritis type  Generalized abdominal pain    Rx / DC Orders ED Discharge Orders         Ordered    sucralfate (CARAFATE) 1 g tablet  3 times daily with meals & bedtime     07/28/19 1721           Rayna Sexton, PA-C 07/28/19 1726    Noemi Chapel, MD 07/28/19 2246

## 2019-07-28 NOTE — Discharge Instructions (Addendum)
I am prescribing you a new medication called Carafate.  This medication will work to coat the lining of your stomach to hopefully prevent some of the symptoms that you are experiencing with your GERD and esophagitis.  Please do not stop taking your Protonix and Mylanta.  Please do not hesitate to come back to the emergency department with any new or worsening symptoms.  Please call Dr. Work tomorrow and schedule a follow-up appointment to discuss this visit.

## 2019-07-28 NOTE — ED Provider Notes (Signed)
Medical screening examination/treatment/procedure(s) were conducted as a shared visit with non-physician practitioner(s) and myself.  I personally evaluated the patient during the encounter.  Clinical Impression:   Final diagnoses:  Chronic gastritis without bleeding, unspecified gastritis type  Generalized abdominal pain   This patient has chronic gastritis - she has presented to the ED recently with same - has ongoing epigastric pain - which is chronic and gets better with mylanta - she is taking her protonix and avoiding NSAIDs and ETOH, she has f/u with GI and a neighbor / cousin that is going to take her - she had endoscopy by Dr. Gala Romney on 05/19/19  recently showing "severe esophagitis, geographic ulcerations and erosions extending circumferentially from the EG junction halfway up the tubular esophagus, no tumor, no Barrett's. " Some gastric erosions present  EKG here is normal and unchanged - non ischemic, her CP is not cardiac in history and description.  Cardiac and pulmonary exams unremarkable, non tender abd other than mild epigastric ttp but no guarding on my exam.  Cardiac monitoring normal without tachycardia.  Carafate - f/u with GI.  Pt agreeable.   Noemi Chapel, MD 07/28/19 [phone]

## 2019-08-01 ENCOUNTER — Telehealth: Payer: Self-pay | Admitting: Family Medicine

## 2019-08-01 NOTE — Telephone Encounter (Signed)
Left a msg for patient to return call

## 2019-08-01 NOTE — Telephone Encounter (Signed)
Pt sees GI for f/u post ER on 4/1-consider return to ER for evaluation if no improvement.  Reading the notes it appears the patient improved when seen in ER , if worsening symptoms would need to return for additional evaluation. If pt can not eat, she will likely need IV fluids especially if a fall risk. Hypotension and orthostasis is common with dehydration.

## 2019-08-01 NOTE — Telephone Encounter (Signed)
[name] is a NP with [employer] that does the patient house calls and states she seen the patient on the 18th and that she is a high fall risk and can not keep food down.

## 2019-08-01 NOTE — Telephone Encounter (Signed)
Please advise 

## 2019-08-02 NOTE — Telephone Encounter (Signed)
Unable to reach patient by phone. Nor was I able to lave a msg this time on machine

## 2019-08-05 ENCOUNTER — Other Ambulatory Visit: Payer: Self-pay | Admitting: Cardiology

## 2019-08-08 ENCOUNTER — Ambulatory Visit: Payer: Medicare Other | Admitting: Gastroenterology

## 2019-08-08 NOTE — Telephone Encounter (Signed)
Patient was informed and have an appt with Dr Linna Darner the [facility] doctor on August 11, 2019 9:30

## 2019-08-09 ENCOUNTER — Ambulatory Visit: Payer: Medicare Other | Admitting: Cardiology

## 2019-08-10 ENCOUNTER — Encounter: Payer: Self-pay | Admitting: Gastroenterology

## 2019-08-10 IMAGING — MR MR CARD MORPHOLOGY WO/W CM
18 of 20 series · 38 of 40 positions shown · IV contrast (Gadavist)
Comparison: none

CLINICAL DATA: 66-year-old female with hypertension, DM,
pericardial effusion and chest pain.

EXAM:
CARDIAC MRI
TECHNIQUE: The patient was scanned on a 1.5 Tesla GE magnet. A dedicated
cardiac coil was used. Functional imaging was done using Fiesta
sequences. [DATE], and 4 chamber views were done to assess for RWMA's.
Modified Joshjax rule using a short axis stack was used to
calculate an ejection fraction on a dedicated work station using
Circle software. The patient received 10 cc of Gadavist. After 10
minutes inversion recovery sequences were used to assess for
infiltration and scar tissue.
CONTRAST:  10 cc  of Gadavist

[Series 6: bSSFP · oblique · 8.0mm · 1.61mm/px · 21 of 425 slices shown (1 of 7)]
[im 1/425]
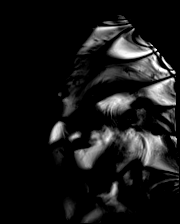
[im 22/425]
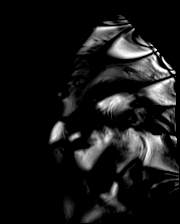
[im 43/425]
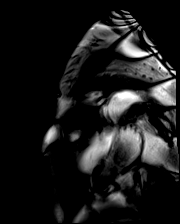
[im 64/425]
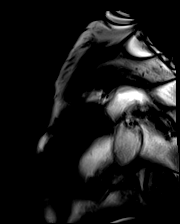
[im 85/425]
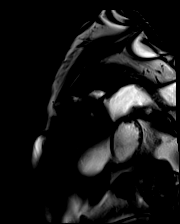
[im 107/425]
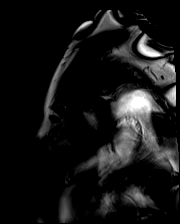
[im 128/425]
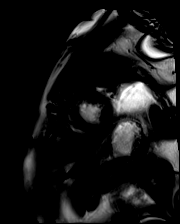
[im 149/425]
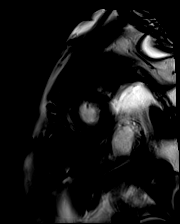
[im 170/425]
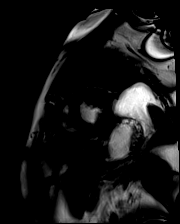
[im 191/425]
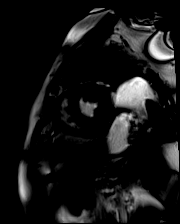
[im 213/425]
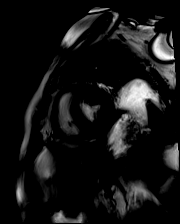
[im 234/425]
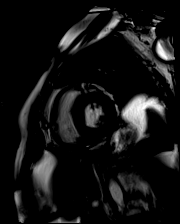
[im 255/425]
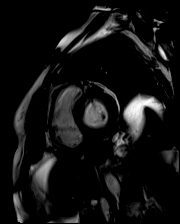
[im 276/425]
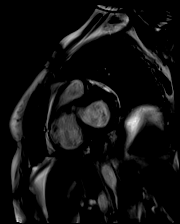
[im 297/425]
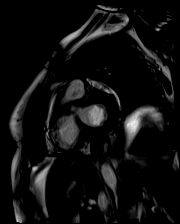
[im 319/425]
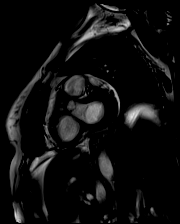
[im 340/425]
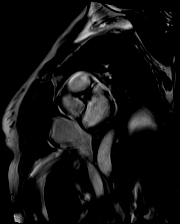
[im 361/425]
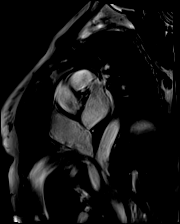
[im 382/425]
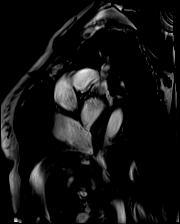
[im 403/425]
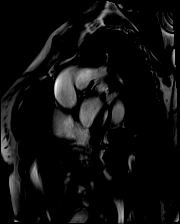
[im 425/425]
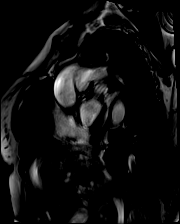

[Series 7: t1_tse_db short axis · oblique · 8.0mm · 1.32mm/px · 1 of 13 slices shown]
[im 1/13]
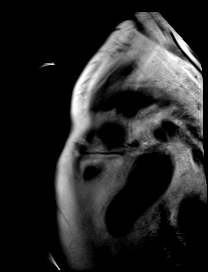

[Series 8: (id)_long_t1 · oblique · 8.0mm · 1.41mm/px · 1 of 24 slices shown]
[im 1/24]
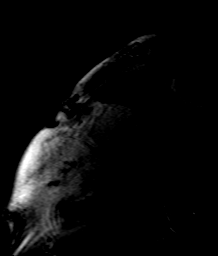

[Series 9: (id)_long_t1_moco · oblique · 8.0mm · 1.41mm/px · 1 of 24 slices shown]
[im 1/24]
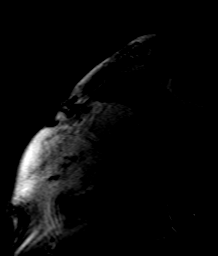

[Series 12: t2_stir_db_sax · oblique · 8.0mm · 1.73mm/px · 1 of 13 slices shown]
[im 1/13]
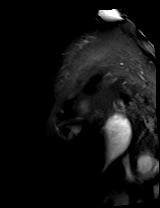

[Series 15: bSSFP · oblique · 6.0mm · 1.41mm/px · 1 of 25 slices shown (2 of 7)]
[im 1/25]
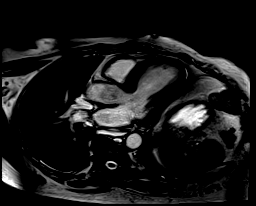

[Series 16: bSSFP · axial · 6.0mm · 1.41mm/px · 1 of 25 slices shown (3 of 7)]
[im 1/25]
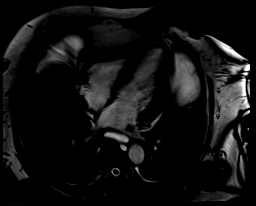

[Series 17: bSSFP · oblique · 6.0mm · 1.41mm/px · 1 of 25 slices shown (4 of 7)]
[im 1/25]
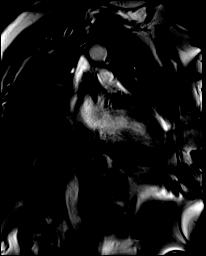

[Series 18: bSSFP · oblique · 6.0mm · 1.41mm/px · 1 of 25 slices shown (5 of 7)]
[im 1/25]
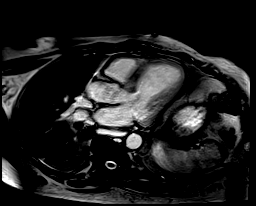

[Series 19: bSSFP · axial · 6.0mm · 1.41mm/px · 1 of 25 slices shown (6 of 7)]
[im 1/25]
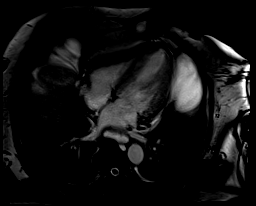

[Series 20: bSSFP · oblique · 6.0mm · 1.41mm/px · 1 of 25 slices shown (7 of 7)]
[im 1/25]
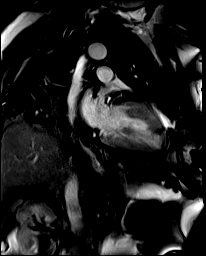

[Series 21: (id)_long_t1 post gad · oblique · 8.0mm · 1.41mm/px · 1 of 24 slices shown]
[im 1/24]
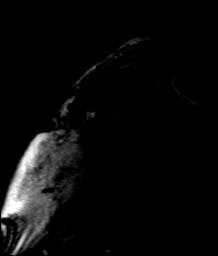

[Series 22: (id)_long_t1 post gad_moco · oblique · 8.0mm · 1.41mm/px · 1 of 24 slices shown]
[im 1/24]
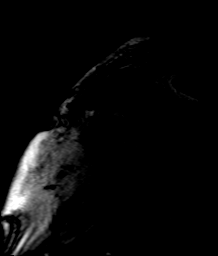

[Series 23: (id)_long_t1 post gad_moco_t1 · oblique · 8.0mm · 1.41mm/px · 1 of 3 slices shown]
[im 1/3]
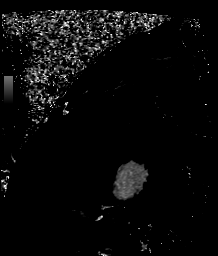

[Series 26: lge_single shot sa · oblique · 8.0mm · 1.98mm/px · 1 of 13 slices shown (1 of 2)]
[im 1/13]
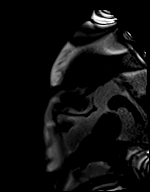

[Series 27: lge_single shot sa · oblique · 8.0mm · 1.98mm/px · 1 of 13 slices shown (2 of 2)]
[im 1/13]
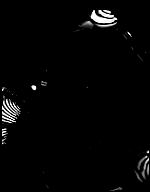

[Series 30: lge_single shot radial_mag · axial · 6.0mm · 1.98mm/px · 1 of 1 slices shown]
[im 1/1]
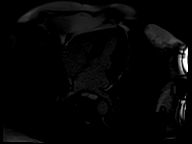

[Series 31: lge_single shot radial_psir · axial · 6.0mm · 1.98mm/px · 1 of 1 slices shown]
[im 1/1]
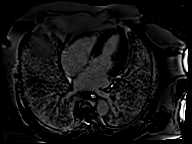

[38 of 40 positions shown; findings below may reference images not displayed]

FINDINGS: 1. Normal left ventricular size, with mild concentric hypertrophy
and normal systolic function (LVEF = 56%). There are no regional
wall motion abnormalities.

There is no late gadolinium enhancement in the left ventricular
myocardium.

LVEDD: 41 mm

LVESD: 35 mm

LVEDV: 137 ml

LVESV: 60 ml

SV: 77 ml

CO: 5.6 L/min

Myocardial mass: 148 g

2. Normal right ventricular size, thickness and systolic function
(LVEF = 53%). There are no regional wall motion abnormalities.

3.  Normal left and right atrial size.

4. Normal size of the aortic root, ascending aorta. Mildly dilated
pulmonary artery measuring 32 mm.

5.  Trivial mitral and tricuspid regurgitation.

6. There is mild almost circumferential pericardial effusion.
Pericardium has normal thickness but there is late gadolinium
enhancement consistent with inflammation.
IMPRESSION: 1. Normal left ventricular size, with mild concentric hypertrophy
and normal systolic function (LVEF = 56%). There are no regional
wall motion abnormalities and no late gadolinium enhancement in the
left ventricular myocardium.

2. Normal right ventricular size, thickness and systolic function
(LVEF = 53%). There are no regional wall motion abnormalities.

3. Mildly dilated pulmonary artery measuring 32 mm.

4. Trivial mitral and tricuspid regurgitation.

5. There is mild almost circumferential pericardial effusion.
Pericardium has normal thickness but there is late gadolinium
enhancement consistent with inflammation.

These findings are consistent with acute pericarditis.

## 2019-08-10 NOTE — Progress Notes (Addendum)
Referring Provider: Maryruth Hancock, MD Primary Care Physician:  Maryruth Hancock, MD Primary GI Physician: Dr. Gala Romney  Chief Complaint  Patient presents with  . Abdominal Pain    seen in ER recently. Still having stomach swelling  . Gastroesophageal Reflux    food comes back up  . Dysphagia    food "hung" in throat    HPI:   DONNALYNN SIRLES is a 69 y.o. female presenting today for ER follow-up with concerns for abdominal swelling, nausea/vomiting, dysphagia, and GERD. History of GERD, H. Pylori 2019 , esophagitis, dysphagia, ?chronic cholecystitis, possible cirrhosis with mildly lobular contour of liver on  [place] in July 2020, Hep B and C negative, massive weight loss over the last 2-3 years (greater than 150 lbs), anemia since March 2019 with normal ferritin but low iron and percent saturation.  TIBC also low.  Other PMHx as per below.   Last seen in our office on 04/19/19 for GERD and dysphagia. BPE completed prior to that visit on 03/04/19 with significant dysmotility. Distal esophagus relatively less distended than the remainder of the thoracic esophagus. Subtle mucosal irregularity of the distal esophagus at the level of the narrowing with inability to exclude esophagitis or potentially developing tumor. Plans for EGD and continuing omeprazole BID.  EGD 05/19/19 with severe reflux esophagitis, medium sized hiatal hernia, gastric erosions s/p biopsied. No dilation performed. Advised to stop omeprazole and start Protonix 40 mg BID. Pathology with mild chronic gastritis, negative for H. Pylori.   Patient has been seen twice in the ED since EGD. The first on 3/11 for GERD and again on 3/18 for GERD and generalized abdominal pain. At her first visit, she had self resolution of symptoms and left the ED without evaluation. At her second encounter, in addition to chest burning, nausea with vomiting after meals, she reported diffuse abdominal pain and diarrhea. Exam with mild diffuse abdominal  distension but soft abdomen,, mild diffuse abdominal tenderness.  ECG unchanged. She was given GI cocktail. Upon recheck, she was feeling much better without palpable abdominal pain. She was discharged with Carafate and advised to follow-up with GI.   Telephone note to PCP on 08/01/19 with Suanne Marker is a NP with [facility] who does patients house calls stating patient was a high fall risk and not able to keep food down. She was advised if she couldn't keep food down, she should proceed to the ED.   Today:  Swelling in her abdomen. States she hasn't been getting much sleep because of this. Abdomen feels "real heavy." Started a couple of month ago.  Comes and goes.  Occasional pain on the sides bilaterally. Also with swelling in ankles at times but states she is taking Lasix daily which is helping. Some confusion at times. States she forgetful for the last 3-4 months. Also reports depression. No yellowing of her eyes. Urine is light in color.   Bowels moving "a lot." Sometimes 3-4 daily.  Sometimes less. Stools can be very loose. Present for about 3-4 months. No antibiotics recently. No hospitalizations. Wakes in the middle of the night to have a BM. Urgency. Not worse after meals. Drinks milk regularly and cheese regularly. Notes this worsens diarrhea. No blood in the stool. No black stool.   Mylanta helps her stomach. Epigastric pain daily. GERD daily, especialy when laying down.  Currently on Protonix 20 mg daily.  Not sure who changed her medications.  Doesn't eat within 3 hours of eating. No spicy foods. No  fried foods. States she eats fish sticks. Eating frozen chicken tenders. Food continues to get hung in her esophagus. Water will help the food go down. Trouble with meats and soft foods. Also with nausea and vomiting before or after meals. Associated with upper abdominal pain, worse in the RUQ.. States she can't sleep on her right side because it hurts.. Will wake up in the middle of the night to vomit as well.  No hematemesis. Doesn't have anything for nausea.   ?  Gastroparesis versus chronic cholecystitis.  No fever or chills. No presyncope or syncope. Only with chest pain when having reflux/burning in her esophagus. Occasional palpitations. Some shortness of breath with walking. No cough.   Cirrhosis: No alcohol in about 3 years. Used to drink heavily in the past. States she can't remember how much she used to drink or tell me exactly when that was. No history of drug use. No family history of liver disease.  She and her sister both have thyroid disorder.  The meal planning food   Weight: States she weighs regularly at home and her weight has been fluctuating up and down.  Overall, down 10 pounds in the last month and a half but weight is actually the same as when we saw her in December 2020.  When talking about the plan with the patient, she is very overwhelmed by all of her doctors appointments and feels everything is too close together and she does not have the time to get everything.  Advised that we could focus on the most symptomatic and bothersome problems at this time and consider pursuing colonoscopy in the future.   Past Medical History:  Diagnosis Date  . Anterolisthesis    Grade 1, L4-5  . Bronchospasm 05/28/2012  . COPD (chronic obstructive pulmonary disease) ([place])   . DEGENERATIVE JOINT DISEASE 10/06/2006  . DEPRESSION 09/26/2008  . DIABETES MELLITUS 10/06/2006  . Diastolic heart failure ([place])    Echo 10/2018 LVEF 55-60% with impaired relaxation.  . Diverticulosis    [id number]  . GERD (gastroesophageal reflux disease) 07/25/2013  . Glaucoma   . HOCM (hypertrophic obstructive cardiomyopathy) ([place])   . HYPERLIPIDEMIA 01/11/2009  . HYPERTENSION 10/06/2006  . Hypokalemia   . Hypomagnesemia   . INSOMNIA 09/26/2008  . Internal hemorrhoids   . OBSTRUCTIVE SLEEP APNEA 06/23/2008   Severe OSA per sleep study 2010, Rx a CPAP  . Pain in joint, multiple sites 11/10/2006  . Pericardial effusion    . Pericarditis   . UNSPECIFIED ANEMIA 12/10/2009  . UTI (urinary tract infection) 11/2017    Past Surgical History:  Procedure Laterality Date  . BIOPSY  12/28/2017   Procedure: BIOPSY;  Surgeon: Rush Landmark Telford Nab., MD;  Location: [facility];  Service: Gastroenterology;;  . BIOPSY  05/19/2019   Procedure: BIOPSY;  Surgeon: Daneil Dolin, MD;  [facility];  Service: Endoscopy;;  gastric  . COLONOSCOPY  08/01/2011   Procedure: COLONOSCOPY;  Surgeon: Inda Castle, MD;  Location: [facility];  Service: Endoscopy;  Laterality: N/A;. hyperplastic polyp removed, hemorrhoids s/p banding, diverticulosis, next tcs 10 years.   . ESOPHAGOGASTRODUODENOSCOPY (EGD) WITH PROPOFOL N/A 12/28/2017   Procedure: ESOPHAGOGASTRODUODENOSCOPY (EGD) WITH PROPOFOL;  Surgeon: Rush Landmark Telford Nab., MD;  Location: [facility];  Service: Gastroenterology;  Laterality: N/A; h.pylori gastiris, tortuous esophagus with esophageal bx (no EOE), medium sized hiatal hernia  . ESOPHAGOGASTRODUODENOSCOPY (EGD) WITH PROPOFOL N/A 05/19/2019   Procedure: ESOPHAGOGASTRODUODENOSCOPY (EGD) WITH PROPOFOL;  Surgeon: Daneil Dolin, MD;  [facility];  Service: Endoscopy;  Laterality: N/A;  8:30AM  . LEFT HEART CATH AND CORONARY ANGIOGRAPHY N/A 10/22/2017   Procedure: LEFT HEART CATH AND CORONARY ANGIOGRAPHY;  Surgeon: Jettie Booze, MD;  Location: [facility];  Service: Cardiovascular;  Laterality: N/A;  . 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 Right knee replacement  2005    Current Outpatient Medications  Medication Sig Dispense Refill  . Accu-Chek Softclix Lancets lancets USE TO CHECK BLOOD SUGAR TWICE A DAY 200 each 12  . albuterol (VENTOLIN HFA) 108 (90 Base) MCG/ACT inhaler Inhale 2 puffs into the lungs every 4 (four) hours as needed for wheezing or shortness of breath. 18 g 5  . alum & mag hydroxide-simeth (MAALOX/MYLANTA) 200-200-20 MG/5ML suspension Take 30 mLs by mouth every 4 (four)  hours as needed for indigestion. 355 mL 0  . amLODipine (NORVASC) 10 MG tablet Take 1 tablet (10 mg total) by mouth daily. 90 tablet 3  . ARIPiprazole (ABILIFY) 5 MG tablet Take 1 tablet by mouth daily.    Marland Kitchen aspirin EC 81 MG tablet Take 1 tablet (81 mg total) by mouth daily. 90 tablet 3  . carvedilol (COREG) 25 MG tablet Take 1 tablet (25 mg total) by mouth 2 (two) times daily. (Patient taking differently: Take 25 mg by mouth daily. ) 60 tablet 11  . Cholecalciferol 50 MCG (2000 UT) CAPS Take 1 capsule (2,000 Units total) by mouth daily with breakfast. 90 capsule 1  . colchicine 0.6 MG tablet Take 0.6 mg by mouth daily.    Marland Kitchen escitalopram (LEXAPRO) 20 MG tablet TAKE 1 TABLET BY MOUTH  DAILY (Patient taking differently: Take 20 mg by mouth daily. ) 90 tablet 3  . Fluticasone-Salmeterol (WIXELA INHUB) 250-50 MCG/DOSE AEPB Inhale 1 puff into the lungs 2 (two) times daily.    . furosemide (LASIX) 20 MG tablet Take 1 tablet (20 mg total) by mouth daily. 90 tablet 3  . glucose blood (ACCU-CHEK AVIVA PLUS) test strip TEST BLOOD SUGAR TWICE DAILY AND LANCETS TWICE DAILY E11.9 200 each 11  . latanoprost (XALATAN) 0.005 % ophthalmic solution Place 1 drop into both eyes at bedtime.    Marland Kitchen levothyroxine (SYNTHROID) 88 MCG tablet Take 1 tablet (88 mcg total) by mouth daily before breakfast. 90 tablet 1  . omeprazole (PRILOSEC) 40 MG capsule Take 1 capsule (40 mg total) by mouth 2 (two) times daily before lunch and supper. 60 capsule 5  . ondansetron (ZOFRAN) 4 MG tablet Take 1 tablet (4 mg total) by mouth every 6 (six) hours as needed for nausea. 20 tablet 0  . OVER THE COUNTER MEDICATION CPAP: At bedtime    . OXYGEN Inhale 2 L into the lungs daily as needed (shortness of breath).     . pantoprazole (PROTONIX) 20 MG tablet Take 1 tablet (20 mg total) by mouth daily. 30 tablet 0  . potassium chloride (KLOR-CON) 10 MEQ tablet Take 10 mEq by mouth daily.    . potassium chloride SA (K-DUR) 20 MEQ tablet Take 1 tablet  (20 mEq total) by mouth 2 (two) times daily. 180 tablet 1  . predniSONE (DELTASONE) 5 MG tablet Take 5 mg by mouth daily.    . rosuvastatin (CRESTOR) 40 MG tablet TAKE 1 TABLET BY MOUTH IN  THE EVENING (Patient taking differently: Take 40 mg by mouth at bedtime. ) 90 tablet 3  . sucralfate (CARAFATE) 1 g tablet Take 1 tablet (1 g total) by mouth 4 (four) times  daily -  with meals and at bedtime for 14 days. 56 tablet 0   No current facility-administered medications for this visit.    Allergies as of 08/11/2019  . (No Known Allergies)    Family History  Problem Relation Age of Onset  . Asthma Mother   . Stroke Mother   . Diabetes Mother   . Diabetes Other        M, B, S  . Hypertension Sister        M, S,B  . Pancreatic cancer Brother   . Colon cancer Neg Hx   . Prostate cancer Neg Hx   . Breast cancer Neg Hx   . Esophageal cancer Neg Hx   . Liver disease Neg Hx   . Rectal cancer Neg Hx   . Stomach cancer Neg Hx   . Inflammatory bowel disease Neg Hx     Social History   Socioeconomic History  . Marital status: Widowed    Spouse name: Not on file  . Number of children: 4  . Years of education: Not on file  . Highest education level: Not on file  Occupational History  . Occupation: disability    Employer: UNEMPLOYED  Tobacco Use  . Smoking status: Former Smoker    Packs/day: 0.20    Years: 4.00    Pack years: 0.80    Types: Cigarettes    Quit date: 05/13/1995    Years since quitting: 24.2  . Smokeless tobacco: Never Used  Substance and Sexual Activity  . Alcohol use: Not Currently  . Drug use: No  . Sexual activity: Not Currently  Other Topics Concern  . Not on file  Social History Narrative   Widow , lives by herself   Lost a son, 3 living    No children in [place], [place] lives in [place], he visits and helps w/ shopping    Lost husband   Social Determinants of Health   Financial Resource Strain:   . Difficulty of Paying Living Expenses:   Food Insecurity:    . Worried About Charity fundraiser in the Last Year:   . Arboriculturist in the Last Year:   Transportation Needs:   . Film/video editor (Medical):   Marland Kitchen Lack of Transportation (Non-Medical):   Physical Activity:   . Days of Exercise per Week:   . Minutes of Exercise per Session:   Stress:   . Feeling of Stress :   Social Connections:   . Frequency of Communication with Friends and Family:   . Frequency of Social Gatherings with Friends and Family:   . Attends Religious Services:   . Active Member of Clubs or Organizations:   . Attends Archivist Meetings:   Marland Kitchen Marital Status:     Review of Systems: Gen: See HPI CV: See HPI Resp: See HPI GI: See HPI Derm: Denies rash. Psych: See HPI Heme: See HPI  Physical Exam: BP 122/80   Pulse 83   Temp (!) 97 F (36.1 C) (Oral)   Ht 5\' 9"  (1.753 m)   Wt 148 lb 6.4 oz (67.3 kg)   BMI 21.91 kg/m  General:   Alert and oriented. No distress noted.  Pleasant and cooperative. Walking with a walker, appears unsteady on her feet when ambulating to the exam table.   Head:  Normocephalic and atraumatic. Eyes:  Conjuctiva clear without scleral icterus. Heart:  S1, S2 present, regular rate and rhythm. Lungs:  Clear to auscultation  bilaterally. No wheezes, rales, or rhonchi. No distress.  Abdomen:  +BS, soft, and non-distended.  Mild tenderness palpation across the upper abdomen.  No rebound or guarding. No HSM or masses noted.  Msk:  Symmetrical without gross deformities. Normal posture. Extremities:  With 1+ pitting edema in the ankles extending about 3 inches above the ankle.  Neurologic:  Alert and  oriented x4 Psych:  Normal mood and affect.

## 2019-08-11 ENCOUNTER — Other Ambulatory Visit: Payer: Self-pay

## 2019-08-11 ENCOUNTER — Encounter: Payer: Self-pay | Admitting: Gastroenterology

## 2019-08-11 ENCOUNTER — Ambulatory Visit (INDEPENDENT_AMBULATORY_CARE_PROVIDER_SITE_OTHER): Payer: Medicare Other | Admitting: Gastroenterology

## 2019-08-11 ENCOUNTER — Encounter: Payer: Self-pay | Admitting: Internal Medicine

## 2019-08-11 VITALS — BP 122/80 | HR 83 | Temp 97.0°F | Ht 69.0 in | Wt 148.4 lb

## 2019-08-11 DIAGNOSIS — K7460 Unspecified cirrhosis of liver: Secondary | ICD-10-CM | POA: Diagnosis not present

## 2019-08-11 DIAGNOSIS — R1010 Upper abdominal pain, unspecified: Secondary | ICD-10-CM

## 2019-08-11 DIAGNOSIS — K2100 Gastro-esophageal reflux disease with esophagitis, without bleeding: Secondary | ICD-10-CM

## 2019-08-11 DIAGNOSIS — R1310 Dysphagia, unspecified: Secondary | ICD-10-CM

## 2019-08-11 DIAGNOSIS — R112 Nausea with vomiting, unspecified: Secondary | ICD-10-CM

## 2019-08-11 DIAGNOSIS — R197 Diarrhea, unspecified: Secondary | ICD-10-CM

## 2019-08-11 DIAGNOSIS — D649 Anemia, unspecified: Secondary | ICD-10-CM

## 2019-08-11 MED ORDER — PANTOPRAZOLE SODIUM 40 MG PO TBEC: 40.0000 mg | DELAYED_RELEASE_TABLET | Freq: Two times a day (BID) | ORAL | 3 refills | Status: DC

## 2019-08-11 MED ORDER — ONDANSETRON HCL 4 MG PO TABS: 4.0000 mg | ORAL_TABLET | Freq: Three times a day (TID) | ORAL | 1 refills | Status: AC | PRN

## 2019-08-11 NOTE — Patient Instructions (Addendum)
Please have labs, stool studies, and ultrasound completed.  Stop Protonix 20 mg and start Protonix 40 mg twice daily 30 minutes before breakfast and dinner.  I have sent a new prescription to your pharmacy. Be sure you are not taking Protonix within 3 hours of your levothyroxine.   I am sending in Zofran 4 mg.  You may take this every 8 hours as needed for nausea and vomiting.  Be sure to avoid fried, fatty, greasy, spicy, citrus foods.  Avoid carbonated beverages, caffeine, and chocolate.  Do not eat within 3 hours of laying down.  Have the head of your bed propped up on wood or bricks to create a 6 inch incline or try sleeping on a wedge.  Continue to monitor your trouble swallowing.  I suspect this is likely related to the significant inflammation that was seen on her last upper endoscopy.  If something were to get hung in your esophagus and not come up or go down, you should proceed to the emergency room.  For cirrhosis: Follow a low-sodium diet.  Do not consume more than 2 g of sodium daily. This is very important  Be sure you are taking Lasix 20 mg every day. Monitor for any worsening of swelling and let me know immediately. We may need to add an additional fluid medication.   For diarrhea:  We are checking our stool for infection.  Follow a lactose free diet or take Lactaid pills prior to consuming dairy products.  Almond milk is a good alternative to cows milk. See handout below.   We will see you back in 8 weeks. I will have further recommendations for you once I have your labs, stool studies, and ultrasound results back.   Aliene Altes, PA-C [facility]   Lactose-Free Diet, Adult If you have lactose intolerance, you are not able to digest lactose. Lactose is a natural sugar found mainly in dairy milk and dairy products. You may need to avoid all foods and beverages that contain lactose. A lactose-free diet can help you do this. Which foods have lactose? Lactose  is found in dairy milk and dairy products, such as:  Yogurt.  Cheese.  Butter.  Margarine.  Sour cream.  Cream.  Whipped toppings and nondairy creamers.  Ice cream and other dairy-based desserts. Lactose is also found in foods or products made with dairy milk or milk ingredients. To find out whether a food contains dairy milk or a milk ingredient, look at the ingredients list. Avoid foods with the statement "May contain milk" and foods that contain:  Milk powder.  Whey.  Curd.  Caseinate.  Lactose.  Lactalbumin.  Lactoglobulin. What are alternatives to dairy milk and foods made with milk products?  Lactose-free milk.  Soy milk with added calcium and vitamin D.  Almond milk, coconut milk, rice milk, or other nondairy milk alternatives with added calcium and vitamin D. Note that these are low in protein.  Soy products, such as soy yogurt, soy cheese, soy ice cream, and soy-based sour cream.  Other nut milk products, such as almond yogurt, almond cheese, cashew yogurt, cashew cheese, cashew ice cream, coconut yogurt, and coconut ice cream. What are tips for following this plan?  Do not consume foods, beverages, vitamins, minerals, or medicines containing lactose. Read ingredient lists carefully.  Look for the words "lactose-free" on labels.  Use lactase enzyme drops or tablets as directed by your health care provider.  Use lactose-free milk or a milk alternative, such as soy  milk or almond milk, for drinking and cooking.  Make sure you get enough calcium and vitamin D in your diet. A lactose-free eating plan can be lacking in these important nutrients.  Take calcium and vitamin D supplements as directed by your health care provider. Talk to your health care provider about supplements if you are not able to get enough calcium and vitamin D from food. What foods can I eat?  Fruits All fresh, canned, frozen, or dried fruits that are not processed with  lactose. Vegetables All fresh, frozen, and canned vegetables without cheese, cream, or butter sauces. Grains Any that are not made with dairy milk or dairy products. Meats and other proteins Any meat, fish, poultry, and other protein sources that are not made with dairy milk or dairy products. Soy cheese and yogurt. Fats and oils Any that are not made with dairy milk or dairy products. Beverages Lactose-free milk. Soy, rice, or almond milk with added calcium and vitamin D. Fruit and vegetable juices. Sweets and desserts Any that are not made with dairy milk or dairy products. Seasonings and condiments Any that are not made with dairy milk or dairy products. Calcium Calcium is found in many foods that contain lactose and is important for bone health. The amount of calcium you need depends on your age:  Adults younger than 50 years: 1,000 mg of calcium a day.  Adults older than 50 years: 1,200 mg of calcium a day. If you are not getting enough calcium, you may get it from other sources, including:  Orange juice with calcium added. There are 300-350 mg of calcium in 1 cup of orange juice.  Calcium-fortified soy milk. There are 300-400 mg of calcium in 1 cup of calcium-fortified soy milk.  Calcium-fortified rice or almond milk. There are 300 mg of calcium in 1 cup of calcium-fortified rice or almond milk.  Calcium-fortified breakfast cereals. There are 100-1,000 mg of calcium in calcium-fortified breakfast cereals.  Spinach, cooked. There are 145 mg of calcium in  cup of cooked spinach.  Edamame, cooked. There are 130 mg of calcium in  cup of cooked edamame.  Collard greens, cooked. There are 125 mg of calcium in  cup of cooked collard greens.  Kale, frozen or cooked. There are 90 mg of calcium in  cup of cooked or frozen kale.  Almonds. There are 95 mg of calcium in  cup of almonds.  Broccoli, cooked. There are 60 mg of calcium in 1 cup of cooked broccoli. The items listed  above may not be a complete list of recommended foods and beverages. Contact a dietitian for more options. What foods are not recommended? Fruits None, unless they are made with dairy milk or dairy products. Vegetables None, unless they are made with dairy milk or dairy products. Grains Any grains that are made with dairy milk or dairy products. Meats and other proteins None, unless they are made with dairy milk or dairy products. Dairy All dairy products, including milk, goat's milk, buttermilk, kefir, acidophilus milk, flavored milk, evaporated milk, condensed milk, dulce de [place], eggnog, yogurt, cheese, and cheese spreads. Fats and oils Any that are made with milk or milk products. Margarines and salad dressings that contain milk or cheese. Cream. Half and half. Cream cheese. Sour cream. Chip dips made with sour cream or yogurt. Beverages Hot chocolate. Cocoa with lactose. Instant iced teas. Powdered fruit drinks. Smoothies made with dairy milk or yogurt. Sweets and desserts Any that are made with milk or  milk products. Seasonings and condiments Chewing gum that has lactose. Spice blends if they contain lactose. Artificial sweeteners that contain lactose. Nondairy creamers. The items listed above may not be a complete list of foods and beverages to avoid. Contact a dietitian for more information. Summary  If you are lactose intolerant, it means that you have a hard time digesting lactose, a natural sugar found in milk and milk products.  Following a lactose-free diet can help you manage this condition.  Calcium is important for bone health and is found in many foods that contain lactose. Talk with your health care provider about other sources of calcium. This information is not intended to replace advice given to you by your health care provider. Make sure you discuss any questions you have with your health care provider. Document Revised: 05/26/2017 Document Reviewed:  05/26/2017 Elsevier Patient Education  [place].  Low-Sodium Eating Plan Sodium, which is an element that makes up salt, helps you maintain a healthy balance of fluids in your body. Too much sodium can increase your blood pressure and cause fluid and waste to be held in your body. Your health care provider or dietitian may recommend following this plan if you have high blood pressure (hypertension), kidney disease, liver disease, or heart failure. Eating less sodium can help lower your blood pressure, reduce swelling, and protect your heart, liver, and kidneys. What are tips for following this plan? General guidelines  Most people on this plan should limit their sodium intake to 1,500-2,000 mg (milligrams) of sodium each day. Reading food labels   The Nutrition Facts label lists the amount of sodium in one serving of the food. If you eat more than one serving, you must multiply the listed amount of sodium by the number of servings.  Choose foods with less than 140 mg of sodium per serving.  Avoid foods with 300 mg of sodium or more per serving. Shopping  Look for lower-sodium products, often labeled as "low-sodium" or "no salt added."  Always check the sodium content even if foods are labeled as "unsalted" or "no salt added".  Buy fresh foods. ? Avoid canned foods and premade or frozen meals. ? Avoid canned, cured, or processed meats  Buy breads that have less than 80 mg of sodium per slice. Cooking  Eat more home-cooked food and less restaurant, buffet, and fast food.  Avoid adding salt when cooking. Use salt-free seasonings or herbs instead of table salt or sea salt. Check with your health care provider or pharmacist before using salt substitutes.  Cook with plant-based oils, such as canola, sunflower, or olive oil. Meal planning  When eating at a restaurant, ask that your food be prepared with less salt or no salt, if possible.  Avoid foods that contain MSG  (monosodium glutamate). MSG is sometimes added to [place] food, bouillon, and some canned foods. What foods are recommended? The items listed may not be a complete list. Talk with your dietitian about what dietary choices are best for you. Grains Low-sodium cereals, including oats, puffed wheat and rice, and shredded wheat. Low-sodium crackers. Unsalted rice. Unsalted pasta. Low-sodium bread. Whole-grain breads and whole-grain pasta. Vegetables Fresh or frozen vegetables. "No salt added" canned vegetables. "No salt added" tomato sauce and paste. Low-sodium or reduced-sodium tomato and vegetable juice. Fruits Fresh, frozen, or canned fruit. Fruit juice. Meats and other protein foods Fresh or frozen (no salt added) meat, poultry, seafood, and fish. Low-sodium canned tuna and salmon. Unsalted nuts. Dried peas, beans,  and lentils without added salt. Unsalted canned beans. Eggs. Unsalted nut butters. Dairy Milk. Soy milk. Cheese that is naturally low in sodium, such as ricotta cheese, fresh mozzarella, or Swiss cheese Low-sodium or reduced-sodium cheese. Cream cheese. Yogurt. Fats and oils Unsalted butter. Unsalted margarine with no trans fat. Vegetable oils such as canola or olive oils. Seasonings and other foods Fresh and dried herbs and spices. Salt-free seasonings. Low-sodium mustard and ketchup. Sodium-free salad dressing. Sodium-free light mayonnaise. Fresh or refrigerated horseradish. Lemon juice. Vinegar. Homemade, reduced-sodium, or low-sodium soups. Unsalted popcorn and pretzels. Low-salt or salt-free chips. What foods are not recommended? The items listed may not be a complete list. Talk with your dietitian about what dietary choices are best for you. Grains Instant hot cereals. Bread stuffing, pancake, and biscuit mixes. Croutons. Seasoned rice or pasta mixes. Noodle soup cups. Boxed or frozen macaroni and cheese. Regular salted crackers. Self-rising flour. Vegetables Sauerkraut, pickled  vegetables, and relishes. Olives. [place] fries. Onion rings. Regular canned vegetables (not low-sodium or reduced-sodium). Regular canned tomato sauce and paste (not low-sodium or reduced-sodium). Regular tomato and vegetable juice (not low-sodium or reduced-sodium). Frozen vegetables in sauces. Meats and other protein foods Meat or fish that is salted, canned, smoked, spiced, or pickled. Bacon, ham, sausage, hotdogs, corned beef, chipped beef, packaged lunch meats, salt pork, jerky, pickled herring, anchovies, regular canned tuna, sardines, salted nuts. Dairy Processed cheese and cheese spreads. Cheese curds. Blue cheese. Feta cheese. String cheese. Regular cottage cheese. Buttermilk. Canned milk. Fats and oils Salted butter. Regular margarine. Ghee. Bacon fat. Seasonings and other foods Onion salt, garlic salt, seasoned salt, table salt, and sea salt. Canned and packaged gravies. Worcestershire sauce. Tartar sauce. Barbecue sauce. Teriyaki sauce. Soy sauce, including reduced-sodium. Steak sauce. Fish sauce. Oyster sauce. Cocktail sauce. Horseradish that you find on the shelf. Regular ketchup and mustard. Meat flavorings and tenderizers. Bouillon cubes. Hot sauce and Tabasco sauce. Premade or packaged marinades. Premade or packaged taco seasonings. Relishes. Regular salad dressings. Salsa. Potato and tortilla chips. Corn chips and puffs. Salted popcorn and pretzels. Canned or dried soups. Pizza. Frozen entrees and pot pies. Summary  Eating less sodium can help lower your blood pressure, reduce swelling, and protect your heart, liver, and kidneys.  Most people on this plan should limit their sodium intake to 1,500-2,000 mg (milligrams) of sodium each day.  Canned, boxed, and frozen foods are high in sodium. Restaurant foods, fast foods, and pizza are also very high in sodium. You also get sodium by adding salt to food.  Try to cook at home, eat more fresh fruits and vegetables, and eat less fast  food, canned, processed, or prepared foods. This information is not intended to replace advice given to you by your health care provider. Make sure you discuss any questions you have with your health care provider. Document Revised: 04/10/2017 Document Reviewed: 04/21/2016 Elsevier Patient Education  2020 [employer].

## 2019-08-12 ENCOUNTER — Encounter: Payer: Self-pay | Admitting: Gastroenterology

## 2019-08-12 DIAGNOSIS — R197 Diarrhea, unspecified: Secondary | ICD-10-CM | POA: Insufficient documentation

## 2019-08-12 NOTE — Assessment & Plan Note (Addendum)
Ongoing daily upper abdominal pain likely multifactorial in the setting of uncontrolled GERD, severe reflux esophagitis noted on EGD in January 2021, and possible chronic cholecystitis with HIDA completed in July 2020 suggestive of this.  Patient did see general surgery in September 2020 to consider cholecystectomy but this was put on hold due to evaluation for HOCM and symptoms possibly being related to esophageal motility problems and GERD.  At this time, I am increasing Protonix from 20 mg daily to 40 mg twice daily.  Advised to prop the head of her bed up to create a 6 inch incline or sleep on a wedge as GERD symptoms are worse when laying down.  Also advised to avoid fried, fatty, greasy, spicy, citrus foods, chocolate, caffeine, carbonated beverages.  We are pursuing complete abdominal ultrasound considering possible cirrhosis noted on prior ultrasound; this will also evaluate for signs of chronic cholecystitis as well.  Ultimately, patient may need referral back to general surgery for reevaluation of cholecystectomy.  Follow-up in 2 months.

## 2019-08-12 NOTE — Assessment & Plan Note (Addendum)
Continues with dysphagia with sensation of food getting hung in her mid to upper esophagus but going down with the help of water.  Patient does have significant esophageal dysmotility noted on BPE completed 03/04/2019 which is likely playing a role.  However, she also had EGD on 05/19/2019 that revealed severe reflux esophagitis with no dilation performed.  Esophagitis is also likely contributing to her dysphagia symptoms.  Unfortunately, her GERD symptoms continue to be uncontrolled.  She is currently only taking Protonix 20 mg daily.  Not sure why her medication was changed as she was placed on Protonix 40 mg twice daily at the time of EGD.   This time, I will change Protonix to 40 mg twice daily to hopefully get her GERD symptoms under control.  Advised not to lie down within 3 hours of eating and to have her bed propped up to create a 6 inch incline or sleep on a wedge as her GERD symptoms are worse at night. I suspect dysphagia symptoms may improve some as GERD improves which will also allow her esophagitis to improve.  There may be a role for empiric dilation in the future once esophagitis has had time to heal. Follow-up in 2 months.  Advised if something were to get hung in her esophagus and not come up or go down, she should proceed to the emergency room.

## 2019-08-12 NOTE — Assessment & Plan Note (Addendum)
Chronic mild anemia since March 2019.  Also with leukopenia.  Hemoglobin has been fairly stable over the last several months around 10.  Patient denies bright red blood per rectum or melena.  In August 2020, ferritin within normal limits at 201.  Iron and percent saturation were low.  Recent EGD on file from January 2021 with severe reflux esophagitis and gastric erosions.  Pathology with mild chronic gastritis, negative for H. Pylori.  Last colonoscopy in 2013 with recommendations to repeat in 10 years.  She has had massive weight loss over the last 2-3 years (greater than 150 pounds).   Anemia is likely multifactorial and may be secondary to chronic disease.  It had been recommended for patient to have updated colonoscopy considering anemia and weight loss but she was having significant upper GI issues so EGD was pursued first.   Discussed pursuing colonoscopy with patient.  She is overwhelmed will of her health issues and frequent doctor's appointments and desires to hold off on this for now.  I will go ahead and update her CBC as well as evaluate for celiac disease in the setting of anemia and diarrhea (discussed below).  Follow-up in 2 months.

## 2019-08-12 NOTE — Assessment & Plan Note (Addendum)
Suspected cirrhosis with mildly lobular contour of the liver on ultrasound in July 2020.  Hepatitis B and C negative.  No other significant work-up has been completed at this time.  There were plans to pursue liver biopsy if cholecystectomy was pursued due to possible chronic cholecystitis but this had been put on hold due to questioning whether symptoms were coming from GERD/esophageal trouble and undergoing cardiac work-up for HOCM at the end of last year.  She is reporting intermittent abdominal swelling and a heaviness to her abdomen over the last couple of months, mild intermittent swelling in her ankles on Lasix 20 mg, and forgetfulness x3-4 months.  Exam today with patient alert and oriented x4, abdomen soft and nondistended, 1+ pitting edema in the ankles extending about 3 inches above the ankle.  Most recent LFTs and kidney function within normal limits in January 2021.  No recent INR on file.  Recent EGD on file from January 2021 without varices.  At this time, I will update basic cirrhosis labs as well as ammonia due to reported forgetfulness and ultrasound.  As patient also continues with frequent nausea and vomiting as well as reported RUQ pain, I suspect she may likely does have chronic cholecystitis and may need to see surgery again for reconsideration of cholecystectomy.  If this is the case, would likely pursue liver biopsy.  Otherwise, we will need to pursue additional laboratory evaluation to determine the etiology of cirrhosis.  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  May also be influenced by history of alcohol use although it is difficult to get clear history on this.  She denies any current alcohol use. No family history of liver disease.   CBC, CMP, INR, AFP, ammonia Ultrasound abdomen complete Hepatitis B antibody and hepatitis A antibody to evaluate for  immunity. Advised on low-sodium diet.  No more than 2 g daily.  Handout provided. Continue Lasix 20 mg daily.  Would consider adding spironolactone in the future if lower extremity edema worsens. Advised to monitor for any worsening swelling in her lower extremities or abdomen and let us know immediately. Follow-up in 2 months.

## 2019-08-12 NOTE — Assessment & Plan Note (Addendum)
Patient continues to have frequent nausea with vomiting that may occur before meals or after meals.  No hematemesis.  Also wakes in the middle of the night with nausea and vomiting.  Also with daily upper abdominal pain, worse in the right upper quadrant with N/V.  States she cannot sleep on her right side because it hurts in the RUQ.  HIDA scan in July 2020 with continued accumulation of radiotracer after CCK administration could be due to biliary dyskinesia, opioid use or chronic cholecystitis.  She had follow-up with general surgery to consider cholecystectomy but this was put on hold as it was unclear whether symptoms were related to GERD and esophageal motility issues and patient was also undergoing evaluation for HOCM.  BPE in October 2020 did reveal significant esophageal dysmotility.  EGD in January 2021 with severe reflux esophagitis no dilation performed.  Unfortunately, GERD symptoms continue to be uncontrolled as she is only on Protonix 20 mg daily but had been started on 40 mg twice daily after her EGD.  It is unclear who changed her medications.  Abdominal exam with mild tenderness to palpation across the entire upper abdomen.  With ongoing nausea and vomiting, report of associated worsening RUQ pain and inability to lay on the right side due to pain, I suspect patient does have chronic cholecystitis and may need to see surgery again for reevaluation of possible cholecystectomy.  As GERD symptoms continue to be uncontrolled, it is hard to say whether or not this is also contributing.   Increase Protonix to 40 mg twice daily.  Zofran 4 mg every 8 hours as needed for nausea/vomiting.   Advised not to lay down within 3 hours of eating and prop the head of her bed up to create a 6 inch incline or sleep on a wedge as GERD symptoms are worse when laying down.   Avoid fried, fatty, greasy, spicy, citrus foods, chocolate, carbonated beverages, and caffeine.   We are updating abdominal ultrasound due to  possible cirrhosis and this will also help evaluate for signs of cholecystitis.  If her gallbladder continues to look inflamed, will likely refer back to surgery.   Follow-up in 2 months.

## 2019-08-12 NOTE — Assessment & Plan Note (Signed)
Patient reports 3 to 79-month history of frequent loose stools.  Sometimes 3-4 daily, sometimes less.  Also waking in the middle of the night with BMs.  Associated urgency.  Not necessarily worse after meals.  Denies abdominal pain prior to BMs.  She does note drinking milk and consuming cheese regularly which does worsen diarrhea.  Denies bright red blood per rectum or melena.  No recent antibiotics or hospitalizations.  Last colonoscopy in 2013 with recommendations to repeat in 10 years.  She has had new onset of mild anemia since March 2019.  Hemoglobin has been fairly stable over the last several months around 10 and iron panel in August 2020 with ferritin within normal limits at 201 but iron and percent saturation were low which is more consistent with anemia of chronic disease.  Also with massive weight loss, greater than 150 pounds, over the last 2 to 3 years.  Weight has been fluctuating up and down within 10 pounds over the last several months.  Suspect dietary intolerances are contributing to frequent stools.  Doubt infectious etiology but will rule this out considering she does have nocturnal stools.  Doubt IBD but cannot rule [place] out considering anemia/weight loss.  She does need updated colonoscopy (due to anemia and weight loss) but patient wants to hold off for now she is overwhelmed with her medical issues and frequent appointments.  Complete C. difficile and GI pathogen panel. Evaluate for celiac disease with TTG IgA and IgA total. Advised to follow a lactose-free diet or take Lactaid pills prior to consuming dairy products. Follow-up in 2 months.

## 2019-08-12 NOTE — Assessment & Plan Note (Signed)
GERD symptoms are not adequately controlled.  Recent EGD on 05/19/2019 with severe reflux esophagitis and gastric erosions s/p biopsy.  Pathology revealed mild chronic gastritis without H. pylori.  She was started on Protonix 40 mg twice daily at that time but currently only taking Protonix 20 mg daily.  Not sure who changed her medications.  Symptoms are typically worse at night.  She does report eating a lot of frozen foods such as fish sticks and chicken tenders which I suspect may be contributing to her symptoms; however, patient is limited in her ability to cook with decreased mobility and on a fixed income.  At this time, we will change Protonix to 40 mg twice daily.  Advised not to lay down within 3 hours of eating and have the head of her bed propped up to create a 6 inch incline or sleep on a wedge as her GERD symptoms are worse when laying down.  Also advised to try and avoid fried, fatty, greasy, spicy, citrus foods, chocolate, carbonated beverages, and caffeine.  Follow-up in 2 months.

## 2019-08-15 NOTE — Progress Notes (Signed)
Cc'ed to pcp, OV made

## 2019-08-16 ENCOUNTER — Ambulatory Visit (HOSPITAL_COMMUNITY): Payer: Medicare Other

## 2019-08-16 ENCOUNTER — Other Ambulatory Visit: Payer: Self-pay | Admitting: Family Medicine

## 2019-08-16 ENCOUNTER — Other Ambulatory Visit: Payer: Self-pay | Admitting: Internal Medicine

## 2019-08-16 MED ORDER — POTASSIUM CHLORIDE CRYS ER 20 MEQ PO TBCR: 20.0000 meq | EXTENDED_RELEASE_TABLET | Freq: Two times a day (BID) | ORAL | 1 refills | Status: AC

## 2019-08-17 ENCOUNTER — Encounter: Payer: Self-pay | Admitting: Family Medicine

## 2019-08-17 ENCOUNTER — Other Ambulatory Visit: Payer: Self-pay

## 2019-08-17 ENCOUNTER — Telehealth: Payer: Medicare Other | Admitting: Family Medicine

## 2019-08-17 ENCOUNTER — Ambulatory Visit (INDEPENDENT_AMBULATORY_CARE_PROVIDER_SITE_OTHER): Payer: Medicare Other | Admitting: Family Medicine

## 2019-08-17 VITALS — BP 150/83 | HR 79 | Temp 97.8°F | Ht 68.0 in | Wt 143.8 lb

## 2019-08-17 DIAGNOSIS — I10 Essential (primary) hypertension: Secondary | ICD-10-CM

## 2019-08-17 DIAGNOSIS — R0781 Pleurodynia: Secondary | ICD-10-CM

## 2019-08-17 DIAGNOSIS — R200 Anesthesia of skin: Secondary | ICD-10-CM | POA: Diagnosis not present

## 2019-08-17 DIAGNOSIS — R202 Paresthesia of skin: Secondary | ICD-10-CM

## 2019-08-17 DIAGNOSIS — E1122 Type 2 diabetes mellitus with diabetic chronic kidney disease: Secondary | ICD-10-CM

## 2019-08-17 DIAGNOSIS — I480 Paroxysmal atrial fibrillation: Secondary | ICD-10-CM

## 2019-08-17 DIAGNOSIS — K219 Gastro-esophageal reflux disease without esophagitis: Secondary | ICD-10-CM

## 2019-08-17 DIAGNOSIS — R0789 Other chest pain: Secondary | ICD-10-CM

## 2019-08-17 DIAGNOSIS — N181 Chronic kidney disease, stage 1: Secondary | ICD-10-CM

## 2019-08-17 NOTE — Progress Notes (Signed)
Established Patient Office Visit  Subjective:  Patient ID: [name], female    DOB: 04-Jun-1950  Age: 69 y.o. MRN: [id number]  CC:  Chief Complaint  Patient presents with  . Follow-up    3  month f/u   HTN- 08/16/19-133/91, pulse 77, 125/88, pulse 81-viewed from pts bp log ER note 3/18 4:31 PM Patient is a 69 year old cachectic African-American female who presents with 3 days of diffuse abdominal pain and intermittent burning chest pain.  She was seen by Dr. Gala Romney her gastroenterologist in January of this year and had an EGD performed showing GERD and esophagitis.  She was seen 1 week ago in the emergency department with similar symptoms which spontaneously resolved and decided to leave before any interventions took place.  She confirms taking Protonix and Mylanta daily but is unsure if she has taken omeprazole.  Her symptoms seem to be consistent with her history of GERD.  Will give patient GI cocktail.  ECG performed which looks unchanged compared to prior.  Patient discussed with my attending physician Dr. Noemi Chapel who agrees the above plan.  Will reevaluate.  5:18 PM patient states she is feeling much better.  She has no palpable abdominal pain at this time.  She asked to be discharged home.  Will discharge with Carafate and GI follow-up.  She verbalized understanding of this.  Strict return precautions given.  Her questions were answered.  She was amicable to time discharge.  Vital signs stable.  Final Clinical Impression(s) / ED Diagnoses Final diagnoses:  Chronic gastritis without bleeding, unspecified gastritis type  Generalized abdominal pain  Stop Protonix 20 mg and start Protonix 40 mg twice daily 30 minutes before breakfast and dinner.   Aliene Altes, PA-C 08/11/2019 [facility] Ongoing daily upper abdominal pain likely multifactorial in the setting of uncontrolled GERD, severe reflux esophagitis noted on EGD in January 2021, and possible chronic  cholecystitis with HIDA completed in July 2020 suggestive of this.  Patient did see general surgery in September 2020 to consider cholecystectomy but this was put on hold due to evaluation for HOCM and symptoms possibly being related to esophageal motility problems and GERD. HPI [name] presents for fall in her kitchen hitting ribs 06/15/19-pt did not have medical evaluation.  Pt states still painful on the right side laying down or twisting. NO SOB.  Pt states painful with movement/twisting.  No h/o of rib fracture.  No skin changes. Pt fell getting a bottle of water in her kitchen. Pt states walker slipped. Pt used sink to pull up.  Pt states she laid on the floor 5 minutes.    Ultrasound rescheduled for 4/20 -pt unable to keep appt yesterday due to transportation Pulmonary  appt  4/15 Cardiology appt 4/23 and 4/29 Endo appt 6/22 Dr. Katy Fitch for diabetic eye exam Home nurse evaluation requested pt needs urine to check kidney function Aid comes daily to assist with bathing, shopping, cooking and cleaning  Pt states tips of fingers changing colors with peeling skin.  Pt with loss of feeling at times.  Pt states no neck pain.  Color change not related to weather/temperature. Pt with tob use in the past  Pt with psy appointment re-scheduled   Past Medical History:  Diagnosis Date  . Anterolisthesis    Grade 1, L4-5  . Bronchospasm 05/28/2012  . COPD (chronic obstructive pulmonary disease) ([place])   . DEGENERATIVE JOINT DISEASE 10/06/2006  . DEPRESSION 09/26/2008  . DIABETES MELLITUS 10/06/2006  .  Diastolic heart failure ([place])    Echo 10/2018 LVEF 55-60% with impaired relaxation.  . Diverticulosis    [id number]  . GERD (gastroesophageal reflux disease) 07/25/2013  . Glaucoma   . HOCM (hypertrophic obstructive cardiomyopathy) ([place])   . HYPERLIPIDEMIA 01/11/2009  . HYPERTENSION 10/06/2006  . Hypokalemia   . Hypomagnesemia   . Hypothyroidism   . INSOMNIA 09/26/2008  . Internal hemorrhoids    . OBSTRUCTIVE SLEEP APNEA 06/23/2008   Severe OSA per sleep study 2010, Rx a CPAP  . Pain in joint, multiple sites 11/10/2006  . Pericardial effusion   . Pericarditis   . UNSPECIFIED ANEMIA 12/10/2009  . UTI (urinary tract infection) 11/2017    Past Surgical History:  Procedure Laterality Date  . BIOPSY  12/28/2017   Procedure: BIOPSY;  Surgeon: Rush Landmark Telford Nab., MD;  Location: [place];  Service: Gastroenterology;;  . BIOPSY  05/19/2019   Procedure: BIOPSY;  Surgeon: Daneil Dolin, MD;  [facility];  Service: Endoscopy;;  gastric  . COLONOSCOPY  08/01/2011   Procedure: COLONOSCOPY;  Surgeon: Inda Castle, MD;  Location: [facility];  Service: Endoscopy;  Laterality: N/A;. hyperplastic polyp removed, hemorrhoids s/p banding, diverticulosis, next tcs 10 years.   . ESOPHAGOGASTRODUODENOSCOPY (EGD) WITH PROPOFOL N/A 12/28/2017   Procedure: ESOPHAGOGASTRODUODENOSCOPY (EGD) WITH PROPOFOL;  Surgeon: Rush Landmark Telford Nab., MD;  Location: [place];  Service: Gastroenterology;  Laterality: N/A; h.pylori gastiris, tortuous esophagus with esophageal bx (no EOE), medium sized hiatal hernia  . ESOPHAGOGASTRODUODENOSCOPY (EGD) WITH PROPOFOL N/A 05/19/2019   Procedure: ESOPHAGOGASTRODUODENOSCOPY (EGD) WITH PROPOFOL; severe reflux esophagitis, medium size hiatal hernia, gastric erosions with biopsies positive for mild chronic gastritis and negative for H. pylori.  No dilation performed.  Marland Kitchen LEFT HEART CATH AND CORONARY ANGIOGRAPHY N/A 10/22/2017   Procedure: LEFT HEART CATH AND CORONARY ANGIOGRAPHY;  Surgeon: Jettie Booze, MD;  Location: [place] [facility];  Service: Cardiovascular;  Laterality: N/A;  . 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 Right knee replacement  2005    Family History  Problem Relation Age of Onset  . Asthma Mother   . Stroke Mother   . Diabetes Mother   . Diabetes Other        M, B, S  . Hypertension Sister        M, S,B  . Pancreatic cancer Brother    . Colon cancer Neg Hx   . Prostate cancer Neg Hx   . Breast cancer Neg Hx   . Esophageal cancer Neg Hx   . Liver disease Neg Hx   . Rectal cancer Neg Hx   . Stomach cancer Neg Hx   . Inflammatory bowel disease Neg Hx     Social History   Socioeconomic History  . Marital status: Widowed    Spouse name: Not on file  . Number of children: 4  . Years of education: Not on file  . Highest education level: Not on file  Occupational History  . Occupation: disability    Employer: UNEMPLOYED  Tobacco Use  . Smoking status: Former Smoker    Packs/day: 0.20    Years: 4.00    Pack years: 0.80    Types: Cigarettes    Quit date: 05/13/1995    Years since quitting: 24.2  . Smokeless tobacco: Never Used  Substance and Sexual Activity  . Alcohol use: Not Currently  . Drug use: No  . Sexual activity: Not Currently  Other Topics Concern  . Not on file  Social  History Narrative   Widow , lives by herself   Lost a son, 3 living    No children in [place], [place] lives in [place], he visits and helps w/ shopping    Lost husband   Social Determinants of Health   Financial Resource Strain:   . Difficulty of Paying Living Expenses:   Food Insecurity:   . Worried About Charity fundraiser in the Last Year:   . Arboriculturist in the Last Year:   Transportation Needs:   . Film/video editor (Medical):   Marland Kitchen Lack of Transportation (Non-Medical):   Physical Activity:   . Days of Exercise per Week:   . Minutes of Exercise per Session:   Stress:   . Feeling of Stress :   Social Connections:   . Frequency of Communication with Friends and Family:   . Frequency of Social Gatherings with Friends and Family:   . Attends Religious Services:   . Active Member of Clubs or Organizations:   . Attends Archivist Meetings:   Marland Kitchen Marital Status:   Intimate Partner Violence:   . Fear of Current or Ex-Partner:   . Emotionally Abused:   Marland Kitchen Physically Abused:   . Sexually Abused:      Outpatient Medications Prior to Visit  Medication Sig Dispense Refill  . Accu-Chek Softclix Lancets lancets USE TO CHECK BLOOD SUGAR TWICE A DAY 200 each 12  . albuterol (VENTOLIN HFA) 108 (90 Base) MCG/ACT inhaler Inhale 2 puffs into the lungs every 4 (four) hours as needed for wheezing or shortness of breath. 18 g 5  . alum & mag hydroxide-simeth (MAALOX/MYLANTA) 200-200-20 MG/5ML suspension Take 30 mLs by mouth every 4 (four) hours as needed for indigestion. 355 mL 0  . ARIPiprazole (ABILIFY) 5 MG tablet Take 1 tablet by mouth daily.    Marland Kitchen aspirin EC 81 MG tablet Take 1 tablet (81 mg total) by mouth daily. 90 tablet 3  . Cholecalciferol 50 MCG (2000 UT) CAPS Take 1 capsule (2,000 Units total) by mouth daily with breakfast. 90 capsule 1  . colchicine 0.6 MG tablet Take 0.6 mg by mouth daily.    Marland Kitchen escitalopram (LEXAPRO) 20 MG tablet TAKE 1 TABLET BY MOUTH  DAILY (Patient taking differently: Take 20 mg by mouth daily. ) 90 tablet 3  . Fluticasone-Salmeterol (WIXELA INHUB) 250-50 MCG/DOSE AEPB Inhale 1 puff into the lungs 2 (two) times daily.    . furosemide (LASIX) 20 MG tablet Take 1 tablet (20 mg total) by mouth daily. 90 tablet 3  . glucose blood (ACCU-CHEK AVIVA PLUS) test strip TEST BLOOD SUGAR TWICE DAILY AND LANCETS TWICE DAILY E11.9 200 each 11  . latanoprost (XALATAN) 0.005 % ophthalmic solution Place 1 drop into both eyes at bedtime.    Marland Kitchen levothyroxine (SYNTHROID) 88 MCG tablet Take 1 tablet (88 mcg total) by mouth daily before breakfast. 90 tablet 1  . ondansetron (ZOFRAN) 4 MG tablet Take 1 tablet (4 mg total) by mouth every 8 (eight) hours as needed for nausea or vomiting. 30 tablet 1  . OVER THE COUNTER MEDICATION CPAP: At bedtime    . OXYGEN Inhale 2 L into the lungs daily as needed (shortness of breath).     . pantoprazole (PROTONIX) 40 MG tablet Take 1 tablet (40 mg total) by mouth 2 (two) times daily. 60 tablet 3  . potassium chloride SA (KLOR-CON) 20 MEQ tablet Take 1 tablet (20  mEq total) by mouth 2 (two) times  daily. 180 tablet 1  . predniSONE (DELTASONE) 5 MG tablet Take 5 mg by mouth daily.    . rosuvastatin (CRESTOR) 40 MG tablet TAKE 1 TABLET BY MOUTH IN  THE EVENING (Patient taking differently: Take 40 mg by mouth at bedtime. ) 90 tablet 3  . amLODipine (NORVASC) 10 MG tablet Take 1 tablet (10 mg total) by mouth daily. 90 tablet 3  . carvedilol (COREG) 25 MG tablet Take 1 tablet (25 mg total) by mouth 2 (two) times daily. (Patient taking differently: Take 25 mg by mouth daily. ) 60 tablet 11  . sucralfate (CARAFATE) 1 g tablet Take 1 tablet (1 g total) by mouth 4 (four) times daily -  with meals and at bedtime for 14 days. 56 tablet 0   No facility-administered medications prior to visit.    No Known Allergies  ROS Review of Systems  Respiratory: Negative for cough and shortness of breath.   Cardiovascular: Positive for chest pain.       Evaluation in the ER-felt to be related to GERD-ecg completed in ER  Gastrointestinal: Positive for abdominal distention and abdominal pain.       Evaluation in ER and GI for GERD  Musculoskeletal:       Right rib pain  Psychiatric/Behavioral: Positive for agitation.       Psy rescheduled-agitation during evaluation with concerns about transportation limitation. Pt never learned to drive and children are not in the area to assist. Pt dependent on government assisted transportation for doctor appointments      Objective:    Physical Exam  Constitutional: She is oriented to person, place, and time. She appears well-developed and well-nourished.  HENT:  Head: Normocephalic and atraumatic.  Right Ear: External ear normal.  Left Ear: External ear normal.  Eyes: Conjunctivae are normal.  Cardiovascular: Normal rate and regular rhythm.  Pulmonary/Chest: Effort normal and breath sounds normal.  Musculoskeletal:        General: Tenderness present. No edema.       Arms:     Comments: Rib pain right lateral    Neurological: She is oriented to person, place, and time.  Skin:     Peeling skin , cracked  Psychiatric: She has a normal mood and affect. Her behavior is normal.    BP (!) 150/83 (BP Location: Right Arm, Patient Position: Sitting, Cuff Size: Normal)   Pulse 79   Temp 97.8 F (36.6 C) (Temporal)   Ht 5\' 8"  (1.727 m)   Wt 143 lb 12.8 oz (65.2 kg)   SpO2 96%   BMI 21.86 kg/m  Wt Readings from Last 3 Encounters:  08/17/19 143 lb 12.8 oz (65.2 kg)  08/11/19 148 lb 6.4 oz (67.3 kg)  07/28/19 154 lb (69.9 kg)     Health Maintenance Due  Topic Date Due  . PNA vac Low Risk Adult (2 of 2 - PPSV23) 05/28/2017  . MAMMOGRAM  10/29/2017  . COLONOSCOPY  08/01/2018  . FOOT EXAM  06/02/2019    Lab Results  Component Value Date   TSH 4.429 05/24/2019   Lab Results  Component Value Date   WBC 3.4 (L) 05/17/2019   HGB 10.0 (L) 05/17/2019   HCT 30.7 (L) 05/17/2019   MCV 85.5 05/17/2019   PLT 377 05/17/2019   Lab Results  Component Value Date   NA 135 06/28/2019   K 3.8 06/28/2019   CO2 23 06/28/2019   GLUCOSE 81 06/28/2019   BUN 13 06/28/2019   CREATININE 0.71  06/28/2019   BILITOT 0.6 05/24/2019   ALKPHOS 69 05/24/2019   AST 20 05/24/2019   ALT 12 05/24/2019   PROT 7.1 05/24/2019   ALBUMIN 2.4 (L) 05/24/2019   CALCIUM 8.1 (L) 06/28/2019   ANIONGAP 6 05/24/2019   GFR 77.06 06/04/2018   Lab Results  Component Value Date   CHOL 94 11/18/2017   Lab Results  Component Value Date   HDL 30 (L) 11/18/2017   Lab Results  Component Value Date   LDLCALC 42 11/18/2017   Lab Results  Component Value Date   TRIG 110 11/18/2017   Lab Results  Component Value Date   CHOLHDL 3.1 11/18/2017   Lab Results  Component Value Date   HGBA1C 5.2 04/28/2019      Assessment & Plan:  1. Controlled type 2 diabetes mellitus with stage 1 chronic kidney disease, without long-term current use of insulin (HCC) - Urinalysis - Ambulatory referral to Vascular Surgery Concern for  color change of fingers with skin changes 2. Numbness and tingling in both hands - Ambulatory referral to Vascular Surgery  3. Essential hypertension - Ambulatory referral to Vascular Surgery  4. Paroxysmal atrial fibrillation (HCC) Concern for vascular change affecting the hands - Ambulatory referral to Vascular Surgery  5. Gastroesophageal reflux disease without esophagitis Followed by GI  6. Rib pain on right side H/o of fall-tenderness to palpation, no eryth, no ecchymosis  Follow-up: pt to keep follow appt with speciality care and find new primary care physician    Hannah Beat, MD

## 2019-08-17 NOTE — Patient Instructions (Addendum)
   COVID-19 Vaccine Information can be found at: [email or web address] For questions related to vaccine distribution or appointments, please email [email or web address] or call [phone].     If you have lab work done today you will be contacted with your lab results within the next 2 weeks.  If you have not heard from us then please contact us. The fastest way to get your results is to register for My Chart.   IF you received an x-ray today, you will receive an invoice from [facility]. Please contact [facility] at [phone] with questions or concerns regarding your invoice.   IF you received labwork today, you will receive an invoice from [facility]. Please contact [facility] at [phone] with questions or concerns regarding your invoice.   Our billing staff will not be able to assist you with questions regarding bills from these companies.  You will be contacted with the lab results as soon as they are available. The fastest way to get your results is to activate your My Chart account. Instructions are located on the last page of this paperwork. If you have not heard from us regarding the results in 2 weeks, please contact this office.       

## 2019-08-18 ENCOUNTER — Ambulatory Visit: Payer: Medicare Other | Admitting: Gastroenterology

## 2019-08-18 DIAGNOSIS — R0781 Pleurodynia: Secondary | ICD-10-CM | POA: Insufficient documentation

## 2019-08-18 LAB — URINALYSIS
Bilirubin Urine: NEGATIVE
Glucose, UA: NEGATIVE
Hgb urine dipstick: NEGATIVE
Ketones, ur: NEGATIVE
Leukocytes,Ua: NEGATIVE
Nitrite: NEGATIVE
Protein, ur: NEGATIVE
Specific Gravity, Urine: 1.006 (ref 1.001–1.03)
pH: 7 (ref 5.0–8.0)

## 2019-08-18 LAB — CBC
HCT: 34.9 % — ABNORMAL LOW (ref 35.0–45.0)
Hemoglobin: 11.3 g/dL — ABNORMAL LOW (ref 11.7–15.5)
MCH: 28.4 pg (ref 27.0–33.0)
MCHC: 32.4 g/dL (ref 32.0–36.0)
MCV: 87.7 fL (ref 80.0–100.0)
MPV: 11.1 fL (ref 7.5–12.5)
Platelets: 329 10*3/uL (ref 140–400)
RBC: 3.98 10*6/uL (ref 3.80–5.10)
RDW: 14.5 % (ref 11.0–15.0)
WBC: 2.5 10*3/uL — ABNORMAL LOW (ref 3.8–10.8)

## 2019-08-18 LAB — COMPLETE METABOLIC PANEL WITH GFR
AG Ratio: 0.6 (calc) — ABNORMAL LOW (ref 1.0–2.5)
ALT: 7 U/L (ref 6–29)
AST: 13 U/L (ref 10–35)
Albumin: 2.8 g/dL — ABNORMAL LOW (ref 3.6–5.1)
Alkaline phosphatase (APISO): 79 U/L (ref 37–153)
BUN: 10 mg/dL (ref 7–25)
CO2: 27 mmol/L (ref 20–32)
Calcium: 8.6 mg/dL (ref 8.6–10.4)
Chloride: 100 mmol/L (ref 98–110)
Creat: 0.87 mg/dL (ref 0.60–0.93)
GFR, Est African American: 78 mL/min/{1.73_m2} (ref >=60)
GFR, Est Non African American: 67 mL/min/{1.73_m2} (ref >=60)
Globulin: 4.7 g/dL (calc) — ABNORMAL HIGH (ref 1.9–3.7)
Glucose, Bld: 72 mg/dL (ref 65–139)
Potassium: 3.8 mmol/L (ref 3.5–5.3)
Sodium: 133 mmol/L — ABNORMAL LOW (ref 135–146)
Total Bilirubin: 0.4 mg/dL (ref 0.2–1.2)
Total Protein: 7.5 g/dL (ref 6.1–8.1)

## 2019-08-18 LAB — HEPATITIS A ANTIBODY, TOTAL: Hepatitis A AB,Total: REACTIVE — AB

## 2019-08-18 LAB — HEPATITIS B SURFACE ANTIBODY,QUALITATIVE: Hep B S Ab: NONREACTIVE

## 2019-08-18 LAB — PROTIME-INR
INR: 1.1
Prothrombin Time: 11.7 s — ABNORMAL HIGH (ref 9.0–11.5)

## 2019-08-18 LAB — AMMONIA: Ammonia: 35 umol/L (ref <=72)

## 2019-08-18 LAB — AFP TUMOR MARKER: AFP-Tumor Marker: 2 ng/mL

## 2019-08-18 LAB — IGA: Immunoglobulin A: 278 mg/dL (ref 70–320)

## 2019-08-18 LAB — TISSUE TRANSGLUTAMINASE, IGA: (tTG) Ab, IgA: 1 U/mL

## 2019-08-19 ENCOUNTER — Ambulatory Visit (INDEPENDENT_AMBULATORY_CARE_PROVIDER_SITE_OTHER): Payer: Medicare Other | Admitting: Primary Care

## 2019-08-19 ENCOUNTER — Encounter: Payer: Self-pay | Admitting: Primary Care

## 2019-08-19 ENCOUNTER — Other Ambulatory Visit: Payer: Self-pay

## 2019-08-19 DIAGNOSIS — Z9181 History of falling: Secondary | ICD-10-CM

## 2019-08-19 DIAGNOSIS — J9611 Chronic respiratory failure with hypoxia: Secondary | ICD-10-CM

## 2019-08-19 DIAGNOSIS — G4733 Obstructive sleep apnea (adult) (pediatric): Secondary | ICD-10-CM | POA: Diagnosis not present

## 2019-08-19 NOTE — Progress Notes (Addendum)
Virtual Visit via Telephone Note  I connected with Frederich Chick on 08/19/19 at  9:30 AM EDT by telephone and verified that I am speaking with the correct person using two identifiers.  Location: Patient: Home Provider: Office   I discussed the limitations, risks, security and privacy concerns of performing an evaluation and management service by telephone and the availability of in person appointments. I also discussed with the patient that there may be a patient responsible charge related to this service. The patient expressed understanding and agreed to proceed.  History of Present Illness: 69 year old female, former smoker. PMH significant for ILD, Chronic respiratory failure, severe obstructive sleep apnea, asthma, afib, HTN, diastolic heart failure, GERD. Patient of Dr. Elsworth Soho, last seen 05/26/19. Maintained on Wixela 1 puff twice daily, albuterol 2 puffs every 4 hours for shortness of breath/wheezing. Wears 2-3L oxygen as needed on exertion. Uses walker and cane. She had first covid vaccine, due next April 14th.  08/19/2019 Patient contacted today for 3 month follow-up. Breathing is alright, feels it is some better over the last several months. She gets tired when she walks. Using Advair inhaler twice daily. She needs to use albuterol rescue inhaler 2-3 times a day. She does not bring her oxygen to her appointments. Uses oxygen only has needed and when she needed to at night. Swelling in her legs is better. She has fallen at home a couple of times. Her last fall February 3rd. She lives alone. Uses walker and cane. Reports chronic nausea/vomiting. She still losing some weight. Reports todays weight 141lbs. Follow with GI for nausea/vomiting. Needs repeat HST d/t OSA and weight loss.   Observations/Objective:  Patient reported: 94-98% 2L RA at rest  HR 78  Patient able to speak in full sentences without overtshortness of breath or wheezing  Significant tests/ events reviewed  Serology  10/2017 positive Smith antibody, SSA-Ro, histone antigen and RNP Negative SCL 70  PSG 07/11/08 >> AHI 88, SpO2 low 62%  Esophagram 02/2019 severe dysmotility, no stricture 11/2017 esophagram >>Nonspecific esophageal motility disorder. No stricture.  12/2011 CT chest shows bibasilar atelectasis CT angiogram 10/2017 bilateral predominantly lower lobe infiltrates without effusions,cardiomegaly with moderate pericardial effusion  CT angio chest 04/2018 Diffuse ground-glass opacities and lower lobe bronchiectasis which is improved from comparison exam of 12/22/2017. 3. Patulous esophagus. 4. 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  Assessment and Plan:  Asthma: - Stable, no recent exacerbations - Continue Wixela 250-50 one puff twice daily - PRN ventolin hfa 2 puffs every 4-6 hours for shortness of breath or wheezing   Chronic respiratory failure: - O2 stable 94-98% on RA - Wears 2-3L at needed on exertion   ILD: - CXR in January 2021 with Dr. Elsworth Soho showed waxing/weaning chronic interstitial process, indicative of CT ILD. No prior positive serology. Patulous esophagus question of scleroderma, however, SCL 70 was negative.  - Referred back to rheumatology/ Dr. Gerilyn Nestle seen on 07/04/19 - Maintained on prednisone '5mg'$  daily and Colchicine for arthralgia; ordered for ESR, CRP  OSA: - Not currently using CPAP  - Needs HST hx OSA and recent weight loss  At risk for injury d/t falls: - Encourage home walker/cane use - Refer home PT eval/treat  Weight loss: - Hepatic cirrhosis, H. Pylori, dysphagia, severe reflux esophagitis  - Increasing Protonix from 20 mg daily to 40 mg twice daily - Ultimately, patient may need referral back to general surgery for reevaluation of cholecystectomy - Follow-up with GI/ last  seen 08/11/19 with Aliene Altes PA  Diastolic heart failure - Continues lasix '20mg'$  daily, low salt diet   Follow Up  Instructions:   - 6 months Dr. Elsworth Soho   I discussed the assessment and treatment plan with the patient. The patient was provided an opportunity to ask questions and all were answered. The patient agreed with the plan and demonstrated an understanding of the instructions.   The patient was advised to call back or seek an in-person evaluation if the symptoms worsen or if the condition fails to improve as anticipated.  I provided 30 minutes of non-face-to-face time during this encounter.   Martyn Ehrich, NP

## 2019-08-19 NOTE — Patient Instructions (Addendum)
Asthma: - Continue Wixela 250-50 one puff twice daily (rinse mouth after use) - Use ventolin 2 puffs every 4-6 hours for shortness of breath or wheezing   Chronic respiratory failure: - O2 stable 94-98% on RA - Wears 2-3L at needed on exertion   OSA: - Not currently using CPAP  - Needs Home sleep test - d/t history OSA and recent weight loss  At risk for injury d/t falls: - Encourage home walker/cance use - Refer home PT eval/treat  Follow-up: - 6 months with Dr. Elsworth Soho

## 2019-08-19 NOTE — Progress Notes (Signed)
Hemoglobin 11.3.  This is stable/improved from 10, 3 months ago.  Sodium is slightly low at 133.  Albumin low at 2.8.  LFTs and kidney function within normal limits.  INR normal at 1.1.  Prothrombin time slightly increased at 11.7.  AFP within normal limits.  Ammonia within normal limits.  No evidence of celiac disease.  She is immune to hepatitis A but has no immunity to hepatitis B.  Meld 7  Elmo Putt, please let patient her labs look stable overall.  Hemoglobin slightly improved, now 11.3.  Her protein levels are low and she needs to ensure she is consuming plenty of protein.  Recommend she add daily protein shakes to help with this.  She does not have celiac disease.  She is immune to hepatitis A but has no immunity to hepatitis B and needs hepatitis B vaccination.  She can discuss hepatitis B vaccination with her PCP or we can provide a prescription for this.  Waiting on [place] which is scheduled for 08/30/19. Further recommendations to follow.  I had ordered stool studies to help evaluate her diarrhea. Please remind to her complete these.

## 2019-08-21 ENCOUNTER — Other Ambulatory Visit: Payer: Self-pay | Admitting: Cardiology

## 2019-08-21 DIAGNOSIS — K2100 Gastro-esophageal reflux disease with esophagitis, without bleeding: Secondary | ICD-10-CM

## 2019-08-21 DIAGNOSIS — R112 Nausea with vomiting, unspecified: Secondary | ICD-10-CM

## 2019-08-21 DIAGNOSIS — R1010 Upper abdominal pain, unspecified: Secondary | ICD-10-CM

## 2019-08-22 ENCOUNTER — Telehealth: Payer: Self-pay | Admitting: Pulmonary Disease

## 2019-08-22 NOTE — Telephone Encounter (Signed)
Can you please ask her rheumatologist Dr Gerilyn Nestle at [facility] to reach me at [phone] ? I have made multiple attempts & unable to reach.

## 2019-08-22 NOTE — Telephone Encounter (Signed)
I spoke with Apolonio Schneiders at Dr. Earnest Conroy office and shw will give the message to her so she can return the call to Dr. Elsworth Soho. [place]   Phone [phone]

## 2019-08-23 NOTE — Telephone Encounter (Signed)
Pt's pharmacy is requesting a refill on pantoprazole. Would Dr. Turner like to refill this medication? Please address 

## 2019-08-24 ENCOUNTER — Ambulatory Visit: Payer: Medicare Other | Attending: Internal Medicine

## 2019-08-24 ENCOUNTER — Telehealth: Payer: Self-pay | Admitting: Pulmonary Disease

## 2019-08-24 ENCOUNTER — Telehealth: Payer: Self-pay | Admitting: Primary Care

## 2019-08-24 DIAGNOSIS — Z23 Encounter for immunization: Secondary | ICD-10-CM

## 2019-08-24 NOTE — Telephone Encounter (Signed)
Note from 4/9 faxed to Dr Earnest Conroy with note on fax cover sheet that the pt needs eval for scleroderma

## 2019-08-24 NOTE — Telephone Encounter (Signed)
FYI Dr, Elsworth Soho

## 2019-08-24 NOTE — Telephone Encounter (Signed)
I ordered, thank you

## 2019-08-24 NOTE — Progress Notes (Signed)
   Covid-19 Vaccination Clinic  Name:  NAILEA WALLING    MRN: [id number] DOB: [birth year]  08/24/2019  Ms. Basilio was observed post Covid-19 immunization for 15 minutes without incident. She was provided with Vaccine Information Sheet and instruction to access the V-Safe system.   Ms. Dedmon was instructed to call 911 with any severe reactions post vaccine: Marland Kitchen Difficulty breathing  . Swelling of face and throat  . A fast heartbeat  . A bad rash all over body  . Dizziness and weakness   Immunizations Administered    Name Date Dose VIS Date Route   Moderna COVID-19 Vaccine 08/24/2019 10:36 AM 0.5 mL 04/12/2019 Intramuscular   Manufacturer: Moderna   Lot: [id number]   [id number]

## 2019-08-24 NOTE — Telephone Encounter (Signed)
Note   [name], Melissa Gosling  I'm afraid we don't have the capacity to accept this patient at this time. I'm sorry.          . Type Date User Summary Attachment  General 08/22/2019 9:35 AM Harland German - -  Note   [facility] to [facility]        . Type Date User Summary Attachment  General 08/22/2019 9:28 AM Harland German - -  Note   RE: Home PT Received: Today Message Contents  Marney Doctor  We are not in network with this patient's insurance. Sorry!      Previous Messages   ----- Message -----  From: Harland German  Sent: 08/19/2019  4:05 PM EDT  To: Mervyn Skeeters  Subject: Home PT                      [birth year]  Order placed by Geraldo Pitter NP please advise. Thanks Chantel           . Type Date User Summary Attachment  General 08/22/2019 8:14 AM Harland German - -  Note   [facility] to [facility]        . Type Date User Summary Attachment  General 08/22/2019 8:14 AM Harland German - -  Note   Lorie Apley Morning Clarene Critchley,   Thank you for this referral. I'm sorry we don't have staff to cover [name]. I believe [facility], [facility], [facility] and [facility] cover [place]. I'm sorry I can't help you with this patient but please try me again.          . Type Date User Summary Attachment  General 08/19/2019 4:07 PM Harland German - -  Note   [facility] cant take here either will sent to [facility]        . Type Date User Summary Attachment  General 08/19/2019 3:49 PM Harland German - -  Note   Order sent to [facility]        . Type Date User Summary Attachment  General 08/19/2019 3:47 PM Harland German - -  Note   [facility] unable to take this pt        . Type Date User Summary Attachment  General 08/19/2019 11:07 AM Harland German - -  Note   Community message sent to Charyl Dancer & Melissa @  [place].         . Type Date User Summary Attachment  General 08/19/2019 10:16 AM Harland German - -  Note   Waiting on signature.         . Type Date User Summary Attachment  Provider Comments 08/19/2019 10:05 AM Elton Sin, LPN Provider Comments -  Note   Home PT eval for fall risk        Referral Order  Order  Ambulatory referral to Physical Therapy (Order # [id number]) on 08/19/2019  View Encounter

## 2019-08-24 NOTE — Telephone Encounter (Signed)
I did not place this order

## 2019-08-24 NOTE — Telephone Encounter (Signed)
I have not received a call from them yet.  Please send my last note on this patient to her rheumatologist and ask them to evaluate her for scleroderma

## 2019-08-25 ENCOUNTER — Telehealth: Payer: Self-pay | Admitting: Family Medicine

## 2019-08-25 ENCOUNTER — Ambulatory Visit: Payer: Medicare Other | Admitting: Adult Health

## 2019-08-25 NOTE — Telephone Encounter (Signed)
FYI

## 2019-08-25 NOTE — Telephone Encounter (Signed)
[facility] called to inform us they reached out to the patient to sch appt and patient denied appt and wanted the referral closed as she has a lot of doctors she is seeing right now. FYI

## 2019-08-30 ENCOUNTER — Encounter: Payer: Self-pay | Admitting: Internal Medicine

## 2019-08-30 ENCOUNTER — Telehealth: Payer: Self-pay

## 2019-08-30 ENCOUNTER — Other Ambulatory Visit: Payer: Self-pay

## 2019-08-30 ENCOUNTER — Ambulatory Visit (HOSPITAL_COMMUNITY)
Admission: RE | Admit: 2019-08-30 | Discharge: 2019-08-30 | Disposition: A | Discharge status: Home / Self Care | Payer: Medicare Other | Source: Ambulatory Visit | Attending: Gastroenterology | Admitting: Gastroenterology

## 2019-08-30 DIAGNOSIS — R9389 Abnormal findings on diagnostic imaging of other specified body structures: Secondary | ICD-10-CM

## 2019-08-30 DIAGNOSIS — R1010 Upper abdominal pain, unspecified: Secondary | ICD-10-CM

## 2019-08-30 DIAGNOSIS — K7460 Unspecified cirrhosis of liver: Secondary | ICD-10-CM | POA: Diagnosis present

## 2019-08-30 DIAGNOSIS — R112 Nausea with vomiting, unspecified: Secondary | ICD-10-CM | POA: Insufficient documentation

## 2019-08-30 NOTE — Telephone Encounter (Signed)
Routing to AB.  

## 2019-08-30 NOTE — Addendum Note (Signed)
Addended by: Cheron Every on: 08/30/2019 04:09 PM   Modules accepted: Orders

## 2019-08-30 NOTE — Telephone Encounter (Signed)
Spoke with pt. Pt is aware of results and the diet she should follow. AB pt says she's not allergic to IV dye. Pt is aware that referral will be sent to Dr. Constance Haw.

## 2019-08-30 NOTE — Telephone Encounter (Signed)
Referral sent to Dr. Constance Haw. CT renal is CT abd w w/o contrast but can add pelvis if needed per notes from radiology. Does she need pelvis added as well?

## 2019-08-30 NOTE — Telephone Encounter (Signed)
Known history of cholelithiasis and chronic cholecystitis, with [place] and HIDA both last year. She saw Dr. Constance Haw Sept 2020. Please refer back to Dr. Constance Haw. Multiple medical issues. Recent labs reviewed with normal LFTs. Follow low-fat diet, avoid fatty, fried, greasy foods. If worsening RUQ pain, please obtain HFP.  Possible renal mass. Is she allergic to the IV dye? Recommend CT with dedicated renal protocol to evaluate kidney. It does not appear she is taking metformin. If so, would need to hold 48 hours after scan and the day of.   Further recommendations per [name] when she returns.

## 2019-08-30 NOTE — Telephone Encounter (Signed)
Call report from [facility]. Please advise in the absence of [place].  IMPRESSION: 1. Contracted gallbladder, gallbladder wall measuring up to 5 mm. There is a gallstone near the gallbladder neck measuring 11 mm. No pericholecystic fluid. Negative sonographic Murphy sign. Nuclear scintigraphic HIDA scan may be helpful to assess for obstruction of the cystic duct if suspected  4. Possible solid mass of the superior pole of the left kidney measuring 2.6 cm. Recommend multiphasic contrast enhanced CT or MRI to exclude solid mass.

## 2019-08-31 NOTE — Addendum Note (Signed)
Addended by: Cheron Every on: 08/31/2019 12:10 PM   Modules accepted: Orders

## 2019-08-31 NOTE — Telephone Encounter (Signed)
Only need to focus on renal part.

## 2019-08-31 NOTE — Telephone Encounter (Addendum)
CT scheduled for 09/19/2019 Monday at 9:00am, arrival 8:45am, npo 4 hrs prior. Per Manuela Schwartz in [place] does not need to p/u oral contrast.  Called patient. Made aware of CT appt details. She advised me she was not aware she needs CT. I advised when she spoke with AM she was agreeable but patient states she did not speak about CT.  I advised CT was for possible renal mass reason she needs this done to evaluate her kidneys. She said she was tired of all of these appointments. I advised patient if she didn't want CT done to let me know but she stated she will do what she needs to do. Letter also mailed to pt with appt information.  Checked [employer] and [employer] and no PA is required for CT

## 2019-09-02 ENCOUNTER — Ambulatory Visit (INDEPENDENT_AMBULATORY_CARE_PROVIDER_SITE_OTHER): Payer: Medicare Other | Admitting: Cardiology

## 2019-09-02 ENCOUNTER — Encounter: Payer: Self-pay | Admitting: Cardiology

## 2019-09-02 ENCOUNTER — Other Ambulatory Visit: Payer: Self-pay | Admitting: Cardiology

## 2019-09-02 ENCOUNTER — Other Ambulatory Visit: Payer: Self-pay

## 2019-09-02 VITALS — BP 110/68 | HR 77 | Ht 68.0 in | Wt 151.2 lb

## 2019-09-02 DIAGNOSIS — I313 Pericardial effusion (noninflammatory): Secondary | ICD-10-CM | POA: Diagnosis not present

## 2019-09-02 DIAGNOSIS — I421 Obstructive hypertrophic cardiomyopathy: Secondary | ICD-10-CM | POA: Diagnosis not present

## 2019-09-02 DIAGNOSIS — I3139 Other pericardial effusion (noninflammatory): Secondary | ICD-10-CM

## 2019-09-02 DIAGNOSIS — I4729 Other ventricular tachycardia: Secondary | ICD-10-CM

## 2019-09-02 DIAGNOSIS — I10 Essential (primary) hypertension: Secondary | ICD-10-CM

## 2019-09-02 DIAGNOSIS — I472 Ventricular tachycardia: Secondary | ICD-10-CM

## 2019-09-02 DIAGNOSIS — R634 Abnormal weight loss: Secondary | ICD-10-CM

## 2019-09-02 DIAGNOSIS — Z8679 Personal history of other diseases of the circulatory system: Secondary | ICD-10-CM | POA: Diagnosis not present

## 2019-09-02 DIAGNOSIS — E119 Type 2 diabetes mellitus without complications: Secondary | ICD-10-CM

## 2019-09-02 NOTE — Progress Notes (Signed)
Cardiology Office Note:    Date:  09/02/2019   ID:  [name], DOB 03/25/1951, MRN [id number]  PCP:  Maryruth Hancock, MD  Cardiologist:  Fransico Him, MD    Referring MD: Maryruth Hancock, MD   Chief Complaint  Patient presents with  . Follow-up    HOCM, CHF, viral pericarditis    History of Present Illness:    [name] is a 69 y.o. female with a hx of chronic chest pain, HLD, hypertension, DM type II, OSA on CPAP, hypothyroidism, COPD with chronic respiratory failure on home O2, obesity, history of pericardial effusion in 2019, cardiac cath 10/2017 normal coronary arteries LVEF 55 to 65%.  Cardiac MRI 03/25/2019 LVEF 56% with mild almost circumferential pericardial effusion, pericardium with normal thickness but late gadolinium enhancement consistent with inflammation consistent with acute pericarditis.  She was treated with ibuprofen and colchicine.  SHe has chronic diastolic CHF with chronic LE edema managed with lasix 20mg  daily and low Na diet.   She also carries a dx of HCM with patchy LGE on cardiac MRI and NSVT on cardiac monitor.  She has not had syncope but has chronic DOE.  She saw Dr. Lovena Le for consideration of ICD.  He was concerned about her unintentional 150 pound weight loss recently and suspected autoimmune disease with question of scleroderma.  In light of her comorbidities he felt the benefit of ICD would be minimal.  If her collagen vascular disease were to abate and the weight loss resolved or if she were to have VT or malignant arrhythmia then he would be willing to reconsider ICD insertion.  She has been diagnosed with scleroderma with significant weight loss and is followed by Dr. Gerilyn Nestle and has been on colchicine and prednisone.   She has been seen by GI due to abdominal swelling, chronic N/V with dysphagia and GERD.  She also has a hx of esophagitis, ? Chronic cholecystitis, possible cirrhosis and H pylori.  She has had massive weight loss over the  past 2-3 years of more than 150lbs.  EGD confirmed severe reflux esophagitis, hiatal hernia and gastric erosions.  GI has been adjusting her PPI.  SHe is in the process of extensive workup.   She is also being followed by Pulmonary for ILD with chronic respiratory failure, severe OSA and asthma.  She uses 2-2L or O2 as needed.  She has become so debilitated that she uses a walker and can to ambulate. She has chronic DOE.  She has chronic DOE which seem stable.   She is here today for followup and has had marked weight loss since I saw her last.  She continues to have significant nausea and vomiting.  No real exertional CP but has chronic DOE.  Denies any  PND, orthopnea,  palpitations or syncope. She is compliant with her meds and is tolerating meds with no SE.     Past Medical History:  Diagnosis Date  . Anterolisthesis    Grade 1, L4-5  . Bronchospasm 05/28/2012  . COPD (chronic obstructive pulmonary disease) ([place])   . DEGENERATIVE JOINT DISEASE 10/06/2006  . DEPRESSION 09/26/2008  . DIABETES MELLITUS 10/06/2006  . Diastolic heart failure ([place])    Echo 10/2018 LVEF 55-60% with impaired relaxation.  . Diverticulosis    [id number]  . GERD (gastroesophageal reflux disease) 07/25/2013  . Glaucoma   . HOCM (hypertrophic obstructive cardiomyopathy) ([place])   . HYPERLIPIDEMIA 01/11/2009  . HYPERTENSION 10/06/2006  . Hypokalemia   . Hypomagnesemia   .  Hypothyroidism   . INSOMNIA 09/26/2008  . Internal hemorrhoids   . OBSTRUCTIVE SLEEP APNEA 06/23/2008   Severe OSA per sleep study 2010, Rx a CPAP  . Pain in joint, multiple sites 11/10/2006  . Pericardial effusion   . Pericarditis   . UNSPECIFIED ANEMIA 12/10/2009  . UTI (urinary tract infection) 11/2017    Past Surgical History:  Procedure Laterality Date  . BIOPSY  12/28/2017   Procedure: BIOPSY;  Surgeon: Rush Landmark Telford Nab., MD;  Location: [place];  Service: Gastroenterology;;  . BIOPSY  05/19/2019   Procedure: BIOPSY;  Surgeon: Daneil Dolin, MD;  [facility];  Service: Endoscopy;;  gastric  . COLONOSCOPY  08/01/2011   Procedure: COLONOSCOPY;  Surgeon: Inda Castle, MD;  Location: [facility];  Service: Endoscopy;  Laterality: N/A;. hyperplastic polyp removed, hemorrhoids s/p banding, diverticulosis, next tcs 10 years.   . ESOPHAGOGASTRODUODENOSCOPY (EGD) WITH PROPOFOL N/A 12/28/2017   Procedure: ESOPHAGOGASTRODUODENOSCOPY (EGD) WITH PROPOFOL;  Surgeon: Rush Landmark Telford Nab., MD;  Location: [place];  Service: Gastroenterology;  Laterality: N/A; h.pylori gastiris, tortuous esophagus with esophageal bx (no EOE), medium sized hiatal hernia  . ESOPHAGOGASTRODUODENOSCOPY (EGD) WITH PROPOFOL N/A 05/19/2019   Procedure: ESOPHAGOGASTRODUODENOSCOPY (EGD) WITH PROPOFOL; severe reflux esophagitis, medium size hiatal hernia, gastric erosions with biopsies positive for mild chronic gastritis and negative for H. pylori.  No dilation performed.  Marland Kitchen LEFT HEART CATH AND CORONARY ANGIOGRAPHY N/A 10/22/2017   Procedure: LEFT HEART CATH AND CORONARY ANGIOGRAPHY;  Surgeon: Jettie Booze, MD;  Location: [facility];  Service: Cardiovascular;  Laterality: N/A;  . 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 Right knee replacement  2005    Current Medications: Current Meds  Medication Sig  . Accu-Chek Softclix Lancets lancets USE TO CHECK BLOOD SUGAR TWICE A DAY  . albuterol (VENTOLIN HFA) 108 (90 Base) MCG/ACT inhaler Inhale 2 puffs into the lungs every 4 (four) hours as needed for wheezing or shortness of breath.  Marland Kitchen alum & mag hydroxide-simeth (MAALOX/MYLANTA) 200-200-20 MG/5ML suspension Take 30 mLs by mouth every 4 (four) hours as needed for indigestion.  Marland Kitchen amLODipine (NORVASC) 10 MG tablet Take 1 tablet (10 mg total) by mouth daily.  . ARIPiprazole (ABILIFY) 5 MG tablet Take 1 tablet by mouth daily.  Marland Kitchen aspirin EC 81 MG tablet Take 1 tablet (81 mg total) by mouth daily.  . carvedilol (COREG) 25 MG tablet Take 1 tablet (25  mg total) by mouth 2 (two) times daily. (Patient taking differently: Take 25 mg by mouth in the morning and at bedtime. )  . Cholecalciferol 50 MCG (2000 UT) CAPS Take 1 capsule (2,000 Units total) by mouth daily with breakfast.  . colchicine 0.6 MG tablet Take 0.6 mg by mouth daily.  Marland Kitchen escitalopram (LEXAPRO) 20 MG tablet TAKE 1 TABLET BY MOUTH  DAILY (Patient taking differently: Take 20 mg by mouth daily. )  . Fluticasone-Salmeterol (WIXELA INHUB) 250-50 MCG/DOSE AEPB Inhale 1 puff into the lungs 2 (two) times daily.  . furosemide (LASIX) 20 MG tablet Take 1 tablet (20 mg total) by mouth daily.  Marland Kitchen glucose blood (ACCU-CHEK AVIVA PLUS) test strip TEST BLOOD SUGAR TWICE DAILY AND LANCETS TWICE DAILY E11.9  . latanoprost (XALATAN) 0.005 % ophthalmic solution Place 1 drop into both eyes at bedtime.  Marland Kitchen levothyroxine (SYNTHROID) 88 MCG tablet Take 1 tablet (88 mcg total) by mouth daily before breakfast.  . ondansetron (ZOFRAN) 4 MG tablet Take 1 tablet (4 mg total) by mouth every  8 (eight) hours as needed for nausea or vomiting.  Marland Kitchen OVER THE COUNTER MEDICATION CPAP: At bedtime  . OXYGEN Inhale 2 L into the lungs daily as needed (shortness of breath).   . pantoprazole (PROTONIX) 40 MG tablet Take 1 tablet (40 mg total) by mouth 2 (two) times daily.  . potassium chloride SA (KLOR-CON) 20 MEQ tablet Take 1 tablet (20 mEq total) by mouth 2 (two) times daily.  . predniSONE (DELTASONE) 5 MG tablet Take 5 mg by mouth daily.  . rosuvastatin (CRESTOR) 40 MG tablet TAKE 1 TABLET BY MOUTH IN  THE EVENING (Patient taking differently: Take 40 mg by mouth at bedtime. )  . sucralfate (CARAFATE) 1 g tablet Take 1 tablet (1 g total) by mouth 4 (four) times daily -  with meals and at bedtime for 14 days.     Allergies:   Patient has no known allergies.   Social History   Socioeconomic History  . Marital status: Widowed    Spouse name: Not on file  . Number of children: 4  . Years of education: Not on file  .  Highest education level: Not on file  Occupational History  . Occupation: disability    Employer: UNEMPLOYED  Tobacco Use  . Smoking status: Former Smoker    Packs/day: 0.20    Years: 4.00    Pack years: 0.80    Types: Cigarettes    Quit date: 05/13/1995    Years since quitting: 24.3  . Smokeless tobacco: Never Used  Substance and Sexual Activity  . Alcohol use: Not Currently  . Drug use: No  . Sexual activity: Not Currently  Other Topics Concern  . Not on file  Social History Narrative   Widow , lives by herself   Lost a son, 3 living    No children in [place], [place] lives in [place], he visits and helps w/ shopping    Lost husband   Social Determinants of Health   Financial Resource Strain:   . Difficulty of Paying Living Expenses:   Food Insecurity:   . Worried About Charity fundraiser in the Last Year:   . Arboriculturist in the Last Year:   Transportation Needs:   . Film/video editor (Medical):   Marland Kitchen Lack of Transportation (Non-Medical):   Physical Activity:   . Days of Exercise per Week:   . Minutes of Exercise per Session:   Stress:   . Feeling of Stress :   Social Connections:   . Frequency of Communication with Friends and Family:   . Frequency of Social Gatherings with Friends and Family:   . Attends Religious Services:   . Active Member of Clubs or Organizations:   . Attends Archivist Meetings:   Marland Kitchen Marital Status:      Family History: The patient's family history includes Asthma in her mother; Diabetes in her mother and another family member; Hypertension in her sister; Pancreatic cancer in her brother; Stroke in her mother. There is no history of Colon cancer, Prostate cancer, Breast cancer, Esophageal cancer, Liver disease, Rectal cancer, Stomach cancer, or Inflammatory bowel disease.  ROS:   Please see the history of present illness.    ROS  All other systems reviewed and negative.   EKGs/Labs/Other Studies Reviewed:    The following  studies were reviewed today: Office notes from GI, Pulmonary   EKG:  EKG is not ordered today.  Recent Labs: 05/24/2019: TSH 4.429 08/17/2019: ALT 7; BUN  10; Creat 0.87; Hemoglobin 11.3; Platelets 329; Potassium 3.8; Sodium 133   Recent Lipid Panel    Component Value Date/Time   CHOL 94 11/18/2017 0012   TRIG 110 11/18/2017 0012   HDL 30 (L) 11/18/2017 0012   CHOLHDL 3.1 11/18/2017 0012   VLDL 22 11/18/2017 0012   LDLCALC 42 11/18/2017 0012    Physical Exam:    VS:  BP 110/68   Pulse 77   Ht 5\' 8"  (1.727 m)   Wt 151 lb 3.2 oz (68.6 kg)   SpO2 100%   BMI 22.99 kg/m     Wt Readings from Last 3 Encounters:  09/02/19 151 lb 3.2 oz (68.6 kg)  08/17/19 143 lb 12.8 oz (65.2 kg)  08/11/19 148 lb 6.4 oz (67.3 kg)     GEN:  Very thin and cachectic appearing HEENT: Normal NECK: No JVD; No carotid bruits LYMPHATICS: No lymphadenopathy CARDIAC: RRR, no murmurs, rubs, gallops RESPIRATORY:  Clear to auscultation without rales, wheezing or rhonchi  ABDOMEN: Soft, non-tender, non-distended MUSCULOSKELETAL:  1+ LE edema; No deformity  SKIN: Warm and dry NEUROLOGIC:  Alert and oriented x 3 PSYCHIATRIC:  Normal affect   ASSESSMENT:    1. History of pericarditis   2. HOCM (hypertrophic obstructive cardiomyopathy) ([place])   3. Pericardial effusion   4. NSVT (nonsustained ventricular tachycardia) ([place])   5. Essential hypertension   6. DM type 2, goal HbA1c < 7% (HCC)   7. Weight loss    PLAN:    In order of problems listed above:  1.  HOCM -cardiac MRI 12/2018 showed patchy LGE at LV basal septum and inferior RV insertion c/w HOCM with moderate LVH -no family hx of SCD and she has no hx of syncope -she has had NSVT on heart monitor but asymptomatic -evaluated by EP for ICD for secondary prevention  -it was felt that risk/benefit/ration in light of her comorbidities, particularly the weight loss would make any possible benefit of a secondary prevention ICD minimal.  -Dr. Lovena Le  felt that if her collagen vascular disease were to abate and the weight loss, resolve, he would be willing to reconsider her treatment options.  -If she were to have VT or malignant arrhythmias, then ICD insertion for a secondary treatment would be reconsidered.  2.  Chronic diastolic CHF -overall does not appear volume overloaded on exam -she has NYHA class 2 HF sx -minimal LE edema on exam -continue Lasix 20mg  daily  3.  H/o viral pericarditis -echo 10/2018 showing normal LVF with EF 55-60% with mild LVH, G2DD and no pericardial effusion -cardiac MRI with small pericardial effusion with normal pericardial thickness and nothing to suggest constriction -no pericardial LGE -repeat 2Decho to reassess pericardial effusion noted on MRI  4.  NSVT -asymptomatic  5.  HTN -BP controlled -continue amlodipine 10mg  daily, Carvedilol 25mg  BID  6.  DM type 2 -followed by PCP  7.  Profound weight loss -she has lost over 150lbs in the past 2-3 years of ? Etiology -she has significant chronic N/V of ? Etiology with chronic abdominal pain.  ? Cirrhosis, severe GERD, scleroderma and ILD all may be contributing.  -no evidence of constrictive pericarditis on cardiac MRI in August of 2020 -per GI, pulmonary and Rheum  I have spent a total of 40 minutes with patient reviewing multiple OV notes from GI, Pulmonary Rheum, Cardiac MRI, 2D echo , telemetry, EKGs, labs and examining patient as well as establishing an assessment and plan that was discussed with  the patient.  > 50% of time was spent in direct patient care.     Medication Adjustments/Labs and Tests Ordered: Current medicines are reviewed at length with the patient today.  Concerns regarding medicines are outlined above.  Orders Placed This Encounter  Procedures  . ECHOCARDIOGRAM COMPLETE   No orders of the defined types were placed in this encounter.   Signed, Fransico Him, MD  09/02/2019 7:32 PM    [place]

## 2019-09-02 NOTE — Patient Instructions (Signed)
Medication Instructions:  Your physician recommends that you continue on your current medications as directed. Please refer to the Current Medication list given to you today.  *If you need a refill on your cardiac medications before your next appointment, please call your pharmacy*  Testing/Procedures: Your physician has requested that you have an echocardiogram. Echocardiography is a painless test that uses sound waves to create images of your heart. It provides your doctor with information about the size and shape of your heart and how well your heart's chambers and valves are working. This procedure takes approximately one hour. There are no restrictions for this procedure.  Follow-Up: At [facility], you and your health needs are our priority.  As part of our continuing mission to provide you with exceptional heart care, we have created designated Provider Care Teams.  These Care Teams include your primary Cardiologist (physician) and Advanced Practice Providers (APPs -  Physician Assistants and Nurse Practitioners) who all work together to provide you with the care you need, when you need it.  We recommend signing up for the patient portal called "MyChart".  Sign up information is provided on this After Visit Summary.  MyChart is used to connect with patients for Virtual Visits (Telemedicine).  Patients are able to view lab/test results, encounter notes, upcoming appointments, etc.  Non-urgent messages can be sent to your provider as well.   To learn more about what you can do with MyChart, go to [email or web address].    Your next appointment:   1 year(s)  The format for your next appointment:   In Person  Provider:   You may see Traci Turner, MD or one of the following Advanced Practice Providers on your designated Care Team:    Dayna Dunn, PA-C  Michele Lenze, PA-C 

## 2019-09-04 NOTE — Progress Notes (Signed)
Gallbladder wall is thickened up to 39mm. Gallstones again noted with 2mm gall stone near gallbladder neck. No pericholecystic fluid. Increased parenchymal echogenicity of the liver. No nodularity noted. No focal liver lesions. Possible solid mass in left kidney measuring 2.6 cm.   Results have already been addressed. See telephone note dated 08/30/19 for details. Plans for referral back to Dr. Constance Haw with general surgery for reconsideration of cholecystectomy. I have sent a message to Dr. Constance Haw requesting possible liver biopsy if cholecystectomy is pursued and liver appears grossly abnormal in order to evaluate for ?cirrhosis on prior US. Also scheduled for CT abd w/wo contrast on 09/19/19 to evaluate renal mass.

## 2019-09-04 NOTE — Telephone Encounter (Signed)
Communication noted. Agree with recommendations. I have also sent a message to Dr. Constance Haw requesting consideration of liver biopsy should cholecystectomy be pursued and liver appears grossly abnormal in order to evaluate for ?cirrhosis noted on prior US. I had discussed this with patient at her las OV.

## 2019-09-08 ENCOUNTER — Ambulatory Visit (INDEPENDENT_AMBULATORY_CARE_PROVIDER_SITE_OTHER): Payer: Medicare Other | Admitting: Internal Medicine

## 2019-09-08 ENCOUNTER — Other Ambulatory Visit: Payer: Self-pay

## 2019-09-08 ENCOUNTER — Encounter: Payer: Self-pay | Admitting: Internal Medicine

## 2019-09-08 VITALS — BP 138/84 | HR 84 | Temp 97.6°F | Ht 68.0 in | Wt 155.0 lb

## 2019-09-08 DIAGNOSIS — I421 Obstructive hypertrophic cardiomyopathy: Secondary | ICD-10-CM | POA: Diagnosis not present

## 2019-09-08 DIAGNOSIS — I10 Essential (primary) hypertension: Secondary | ICD-10-CM

## 2019-09-08 MED ORDER — VERAPAMIL HCL ER 120 MG PO TBCR: 120.0000 mg | EXTENDED_RELEASE_TABLET | Freq: Every day | ORAL | 6 refills | Status: AC

## 2019-09-08 NOTE — Patient Instructions (Addendum)
Medication Instructions:  Your physician has recommended you make the following change in your medication:  Stop Norvasc  Start Calan 120 mg Daily   *If you need a refill on your cardiac medications before your next appointment, please call your pharmacy*   Lab Work: NONE   If you have labs (blood work) drawn today and your tests are completely normal, you will receive your results only by: Marland Kitchen MyChart Message (if you have MyChart) OR . A paper copy in the mail If you have any lab test that is abnormal or we need to change your treatment, we will call you to review the results.   Testing/Procedures: NONE    Follow-Up: At [facility], you and your health needs are our priority.  As part of our continuing mission to provide you with exceptional heart care, we have created designated Provider Care Teams.  These Care Teams include your primary Cardiologist (physician) and Advanced Practice Providers (APPs -  Physician Assistants and Nurse Practitioners) who all work together to provide you with the care you need, when you need it.  We recommend signing up for the patient portal called "MyChart".  Sign up information is provided on this After Visit Summary.  MyChart is used to connect with patients for Virtual Visits (Telemedicine).  Patients are able to view lab/test results, encounter notes, upcoming appointments, etc.  Non-urgent messages can be sent to your provider as well.   To learn more about what you can do with MyChart, go to [email or web address].    Your next appointment:   12 month(s)  The format for your next appointment:   In Person  Provider:   Cristopher Peru, MD   Other Instructions Thank you for choosing [facility]!

## 2019-09-08 NOTE — Progress Notes (Signed)
HPI Ms. Riesgo returns today for ongoing evaluation and management of HCM, pericarditis, and scleroderma. She has been losing weight for a couple of years presumably due to malnutrition from her scleroderma. She has c/o diarrhea with 3 or more BM's daily. In addition, she has been bothered by peripheral edema. Her bp has been well controlled. She denies syncope. No chest pain. She has class 2 dyspnea. She denies recent palpitations.  No Known Allergies   Current Outpatient Medications  Medication Sig Dispense Refill  . Accu-Chek Softclix Lancets lancets USE TO CHECK BLOOD SUGAR TWICE A DAY 200 each 12  . albuterol (VENTOLIN HFA) 108 (90 Base) MCG/ACT inhaler Inhale 2 puffs into the lungs every 4 (four) hours as needed for wheezing or shortness of breath. 18 g 5  . alum & mag hydroxide-simeth (MAALOX/MYLANTA) 200-200-20 MG/5ML suspension Take 30 mLs by mouth every 4 (four) hours as needed for indigestion. 355 mL 0  . amLODipine (NORVASC) 10 MG tablet TAKE 1 TABLET BY MOUTH  DAILY 90 tablet 3  . ARIPiprazole (ABILIFY) 5 MG tablet Take 1 tablet by mouth daily.    Marland Kitchen aspirin EC 81 MG tablet Take 1 tablet (81 mg total) by mouth daily. 90 tablet 3  . carvedilol (COREG) 25 MG tablet Take 1 tablet (25 mg total) by mouth 2 (two) times daily. (Patient taking differently: Take 25 mg by mouth in the morning and at bedtime. ) 60 tablet 11  . Cholecalciferol 50 MCG (2000 UT) CAPS Take 1 capsule (2,000 Units total) by mouth daily with breakfast. 90 capsule 1  . colchicine 0.6 MG tablet Take 0.6 mg by mouth daily.    Marland Kitchen escitalopram (LEXAPRO) 20 MG tablet TAKE 1 TABLET BY MOUTH  DAILY (Patient taking differently: Take 20 mg by mouth daily. ) 90 tablet 3  . Fluticasone-Salmeterol (WIXELA INHUB) 250-50 MCG/DOSE AEPB Inhale 1 puff into the lungs 2 (two) times daily.    . furosemide (LASIX) 20 MG tablet Take 1 tablet (20 mg total) by mouth daily. 90 tablet 3  . glucose blood (ACCU-CHEK AVIVA PLUS) test strip  TEST BLOOD SUGAR TWICE DAILY AND LANCETS TWICE DAILY E11.9 200 each 11  . latanoprost (XALATAN) 0.005 % ophthalmic solution Place 1 drop into both eyes at bedtime.    Marland Kitchen levothyroxine (SYNTHROID) 88 MCG tablet Take 1 tablet (88 mcg total) by mouth daily before breakfast. 90 tablet 1  . ondansetron (ZOFRAN) 4 MG tablet Take 1 tablet (4 mg total) by mouth every 8 (eight) hours as needed for nausea or vomiting. 30 tablet 1  . OVER THE COUNTER MEDICATION CPAP: At bedtime    . OXYGEN Inhale 2 L into the lungs daily as needed (shortness of breath).     . pantoprazole (PROTONIX) 40 MG tablet Take 1 tablet (40 mg total) by mouth 2 (two) times daily. 60 tablet 3  . potassium chloride SA (KLOR-CON) 20 MEQ tablet Take 1 tablet (20 mEq total) by mouth 2 (two) times daily. 180 tablet 1  . predniSONE (DELTASONE) 5 MG tablet Take 5 mg by mouth daily.    . rosuvastatin (CRESTOR) 40 MG tablet TAKE 1 TABLET BY MOUTH IN  THE EVENING (Patient taking differently: Take 40 mg by mouth at bedtime. ) 90 tablet 3  . sucralfate (CARAFATE) 1 g tablet Take 1 tablet (1 g total) by mouth 4 (four) times daily -  with meals and at bedtime for 14 days. 56 tablet 0   No current  facility-administered medications for this visit.     Past Medical History:  Diagnosis Date  . Anterolisthesis    Grade 1, L4-5  . Bronchospasm 05/28/2012  . COPD (chronic obstructive pulmonary disease) ([place])   . DEGENERATIVE JOINT DISEASE 10/06/2006  . DEPRESSION 09/26/2008  . DIABETES MELLITUS 10/06/2006  . Diastolic heart failure ([place])    Echo 10/2018 LVEF 55-60% with impaired relaxation.  . Diverticulosis    [id number]  . GERD (gastroesophageal reflux disease) 07/25/2013  . Glaucoma   . HOCM (hypertrophic obstructive cardiomyopathy) ([place])   . HYPERLIPIDEMIA 01/11/2009  . HYPERTENSION 10/06/2006  . Hypokalemia   . Hypomagnesemia   . Hypothyroidism   . INSOMNIA 09/26/2008  . Internal hemorrhoids   . OBSTRUCTIVE SLEEP APNEA 06/23/2008   Severe OSA  per sleep study 2010, Rx a CPAP  . Pain in joint, multiple sites 11/10/2006  . Pericardial effusion   . Pericarditis   . UNSPECIFIED ANEMIA 12/10/2009  . UTI (urinary tract infection) 11/2017    ROS:   All systems reviewed and negative except as noted in the HPI.   Past Surgical History:  Procedure Laterality Date  . BIOPSY  12/28/2017   Procedure: BIOPSY;  Surgeon: Rush Landmark Telford Nab., MD;  Location: [facility];  Service: Gastroenterology;;  . BIOPSY  05/19/2019   Procedure: BIOPSY;  Surgeon: Daneil Dolin, MD;  [facility];  Service: Endoscopy;;  gastric  . COLONOSCOPY  08/01/2011   Procedure: COLONOSCOPY;  Surgeon: Inda Castle, MD;  Location: [facility];  Service: Endoscopy;  Laterality: N/A;. hyperplastic polyp removed, hemorrhoids s/p banding, diverticulosis, next tcs 10 years.   . ESOPHAGOGASTRODUODENOSCOPY (EGD) WITH PROPOFOL N/A 12/28/2017   Procedure: ESOPHAGOGASTRODUODENOSCOPY (EGD) WITH PROPOFOL;  Surgeon: Rush Landmark Telford Nab., MD;  Location: [facility];  Service: Gastroenterology;  Laterality: N/A; h.pylori gastiris, tortuous esophagus with esophageal bx (no EOE), medium sized hiatal hernia  . ESOPHAGOGASTRODUODENOSCOPY (EGD) WITH PROPOFOL N/A 05/19/2019   Procedure: ESOPHAGOGASTRODUODENOSCOPY (EGD) WITH PROPOFOL; severe reflux esophagitis, medium size hiatal hernia, gastric erosions with biopsies positive for mild chronic gastritis and negative for H. pylori.  No dilation performed.  Marland Kitchen LEFT HEART CATH AND CORONARY ANGIOGRAPHY N/A 10/22/2017   Procedure: LEFT HEART CATH AND CORONARY ANGIOGRAPHY;  Surgeon: Jettie Booze, MD;  Location: [facility];  Service: Cardiovascular;  Laterality: N/A;  . 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 Right knee replacement  2005     Family History  Problem Relation Age of Onset  . Asthma Mother   . Stroke Mother   . Diabetes Mother   . Diabetes Other        M, B, S  . Hypertension Sister        M, S,B  .  Pancreatic cancer Brother   . Colon cancer Neg Hx   . Prostate cancer Neg Hx   . Breast cancer Neg Hx   . Esophageal cancer Neg Hx   . Liver disease Neg Hx   . Rectal cancer Neg Hx   . Stomach cancer Neg Hx   . Inflammatory bowel disease Neg Hx      Social History   Socioeconomic History  . Marital status: Widowed    Spouse name: Not on file  . Number of children: 4  . Years of education: Not on file  . Highest education level: Not on file  Occupational History  . Occupation: disability    Employer: UNEMPLOYED  Tobacco Use  . Smoking status: Former Smoker  Packs/day: 0.20    Years: 4.00    Pack years: 0.80    Types: Cigarettes    Quit date: 05/13/1995    Years since quitting: 24.3  . Smokeless tobacco: Never Used  Substance and Sexual Activity  . Alcohol use: Not Currently  . Drug use: No  . Sexual activity: Not Currently  Other Topics Concern  . Not on file  Social History Narrative   Widow , lives by herself   Lost a son, 3 living    No children in [place], [place] lives in [place], he visits and helps w/ shopping    Lost husband   Social Determinants of Health   Financial Resource Strain:   . Difficulty of Paying Living Expenses:   Food Insecurity:   . Worried About Charity fundraiser in the Last Year:   . Arboriculturist in the Last Year:   Transportation Needs:   . Film/video editor (Medical):   Marland Kitchen Lack of Transportation (Non-Medical):   Physical Activity:   . Days of Exercise per Week:   . Minutes of Exercise per Session:   Stress:   . Feeling of Stress :   Social Connections:   . Frequency of Communication with Friends and Family:   . Frequency of Social Gatherings with Friends and Family:   . Attends Religious Services:   . Active Member of Clubs or Organizations:   . Attends Archivist Meetings:   Marland Kitchen Marital Status:   Intimate Partner Violence:   . Fear of Current or Ex-Partner:   . Emotionally Abused:   Marland Kitchen Physically Abused:   .  Sexually Abused:      BP 138/84   Pulse 84   Temp 97.6 F (36.4 C)   Ht 5\' 8"  (1.727 m)   Wt 155 lb (70.3 kg)   SpO2 97%   BMI 23.57 kg/m   Physical Exam:  Chronically ill appearing NAD HEENT: Unremarkable Neck:  7 cm JVD, no thyromegally Lymphatics:  No adenopathy Back:  No CVA tenderness Lungs:  Clear with no wheezes HEART:  Regular rate rhythm, 2/6 systolic murmurs, no rubs, no clicks Abd:  soft, positive bowel sounds, no organomegally, no rebound, no guarding Ext:  2 plus pulses, 2+ edema, no cyanosis, no clubbing Skin:  No rashes no nodules Neuro:  CN II through XII intact, motor grossly intact   Assess/Plan: 1. HCM - while she does have some increased risk for SCD, she is a poor candidate for a primary prevention ICD due to her multiple comorbities. I do not recommend an ICD at this time. 2. Peripheral edema - I suspect that this is multifactorial. I have asked her to stop amlodipine as I strongly suspect that this is contributing.  3. Diarrhea - she notes 3-4 BM's a day. Hopefully the initiation of verapamil will help reduce her total number of bm's.  4. HTN - her bp is fairly well controlled. We will uptitrate her verapamil depending on how she responds.   [email or web address] [email or web address].D.

## 2019-09-12 NOTE — Telephone Encounter (Signed)
[name] I called the patient today to try and get the home sleep study that your ordered scheduled for her.  She stated that she couldn't do it right she has transportation issues and other health issues going on.  She wants to wait until maybe June /July 2021 to try and schedule the home sleep study

## 2019-09-12 NOTE — Telephone Encounter (Signed)
Ok, that's not a problem

## 2019-09-19 ENCOUNTER — Ambulatory Visit (HOSPITAL_COMMUNITY)
Admission: RE | Admit: 2019-09-19 | Discharge: 2019-09-19 | Disposition: A | Discharge status: Home / Self Care | Payer: Medicare Other | Source: Ambulatory Visit | Attending: Gastroenterology | Admitting: Gastroenterology

## 2019-09-19 ENCOUNTER — Other Ambulatory Visit: Payer: Self-pay

## 2019-09-19 DIAGNOSIS — R9389 Abnormal findings on diagnostic imaging of other specified body structures: Secondary | ICD-10-CM | POA: Insufficient documentation

## 2019-09-19 MED ORDER — IOHEXOL 300 MG/ML  SOLN
100.0000 mL | Freq: Once | INTRAMUSCULAR | Status: AC | PRN
  Administered 2019-09-19: 09:00:00 100 mL via INTRAVENOUS

## 2019-09-20 ENCOUNTER — Ambulatory Visit (HOSPITAL_COMMUNITY): Payer: Medicare Other | Attending: Cardiovascular Disease

## 2019-09-20 ENCOUNTER — Other Ambulatory Visit: Payer: Self-pay

## 2019-09-20 DIAGNOSIS — I313 Pericardial effusion (noninflammatory): Secondary | ICD-10-CM | POA: Insufficient documentation

## 2019-09-20 DIAGNOSIS — Z8679 Personal history of other diseases of the circulatory system: Secondary | ICD-10-CM | POA: Insufficient documentation

## 2019-09-20 DIAGNOSIS — I421 Obstructive hypertrophic cardiomyopathy: Secondary | ICD-10-CM | POA: Diagnosis not present

## 2019-09-20 DIAGNOSIS — I3139 Other pericardial effusion (noninflammatory): Secondary | ICD-10-CM

## 2019-09-22 ENCOUNTER — Other Ambulatory Visit: Payer: Self-pay

## 2019-09-22 ENCOUNTER — Ambulatory Visit: Payer: Medicare Other | Admitting: Genetic Counselor

## 2019-09-29 ENCOUNTER — Other Ambulatory Visit: Payer: Self-pay

## 2019-09-29 ENCOUNTER — Encounter: Payer: Self-pay | Admitting: General Surgery

## 2019-09-29 ENCOUNTER — Ambulatory Visit (INDEPENDENT_AMBULATORY_CARE_PROVIDER_SITE_OTHER): Payer: Medicare Other | Admitting: General Surgery

## 2019-09-29 VITALS — BP 123/82 | HR 80 | Temp 97.9°F | Ht 68.0 in | Wt 145.0 lb

## 2019-09-29 DIAGNOSIS — K224 Dyskinesia of esophagus: Secondary | ICD-10-CM | POA: Diagnosis not present

## 2019-09-29 NOTE — Patient Instructions (Signed)
You have gallstones but your main symptoms are the nausea and vomiting up food which is more related to your esophageal dysmotility (esophagus is not working to move food down to the stomach).   I do not think taking the gallbladder out will help your symptoms. I think the risk out weigh the benefits at this time.

## 2019-09-30 NOTE — Progress Notes (Signed)
[facility] Note   HPI:  69 y.o. Female presents to clinic for follow-up evaluation of her gallbladder. The patient is known to me and had work up in the past with gallstones and HIDA that demonstrated possibly but no definitive chronic cholecystitis. The patient also has extensive other medial issues including rheumatology issues with scleroderma versus lupus on prednisone 5mg , COPD on oxygen at home during day and night at times, HOCM, CHF, significant 100 + lb weight loss. She has been evaluated by GI and after seeing me last had a esophogram done that showed severe esophageal dysmotility and EGD with esophagitis.   She comes back in after referral back regarding the gallbladder and possible cirrhosis/ steatosis, contracted gallbladder on repeat US for nausea/vomiting and upper abdominal pain.  I ask her about her symptoms and she tells me that she vomits up foods and has nausea. She cannot eat much or keep stuff down. She vomits up solid food/ and it look the same or at least the same color as when it went down.    I ask her about abdominal pain and she says she has pain everywhere. She says her right side hurts more at night, and she cannot sleep on that side.  She does not have overt pain with greasy or fatty foods but also does not eat those foods often. She is more worried about the vomiting.   Review of Systems:  Nausea/vomiting food  ? Some right sided pain at night when lying on side  All other review of systems: otherwise negative   Vital Signs:  BP 123/82   Pulse 80   Temp 97.9 F (36.6 C)   Ht 5\' 8"  (1.727 m)   Wt 145 lb (65.8 kg)   SpO2 93%   BMI 22.05 kg/m    Physical Exam:  Physical Exam Vitals reviewed.  Constitutional:      General: She is not in acute distress.    Appearance: She is cachectic.  Cardiovascular:     Rate and Rhythm: Normal rate.  Pulmonary:     Effort: Pulmonary effort is normal.  Abdominal:     General: There is no  distension.     Palpations: Abdomen is soft.     Tenderness: There is no abdominal tenderness.  Musculoskeletal:        General: No swelling. Normal range of motion.  Skin:    General: Skin is warm.  Neurological:     Mental Status: She is oriented to person, place, and time.  Psychiatric:        Behavior: Behavior is cooperative.     Assessment:  69 y.o. 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 I have talked to her multiple times and I am not convinced that her symptoms and the symptoms that cause her the most issues are related to her gallbladder. We have discussed that she has severe esophageal dysmotility possible related to her rheumatology issues. I have discussed that we could take her gallbladder out but that her symptoms are likely to continue and there are real risk to surgery from a pulmonary, cardiac, and immune / healing standpoint due to all of her co-morbidities, prednisone use, oxygen use, weight loss and frailty.  I have discussed that she can get another opinion regarding cholecystectomy but I am worried the risk out weight the benefit given that her main symptoms seem to be more driven by the esophogeal motility issue.   Plan:  - I have reached out  to GI and PCP to tell them my thoughts   - I have told the patient that if she really wants to get a cholecystectomy that we would nee to get cardiology to risk stratify her, but at this time she is upset and frustrated and does not want to pursue surgery if it will not likely make her better   -I want to help in the care of Ms. Beier but I do not want to hurt her either, and I am afraid the cholecystectomy will not be the answer to the problems.  -Will follow up PRN   All of the above recommendations were discussed with the patient, and all of patient's questions were answered to her expressed satisfaction.  Curlene Labrum, MD [facility] [address] [place],  [place] [address] [phone] (office)

## 2019-10-02 ENCOUNTER — Emergency Department (HOSPITAL_COMMUNITY)
Admission: EM | Admit: 2019-10-02 | Discharge: 2019-10-02 | Disposition: A | Discharge status: Home / Self Care | Payer: Medicare Other | Attending: Emergency Medicine | Admitting: Emergency Medicine

## 2019-10-02 ENCOUNTER — Other Ambulatory Visit: Payer: Self-pay

## 2019-10-02 ENCOUNTER — Encounter (HOSPITAL_COMMUNITY): Payer: Self-pay | Admitting: Emergency Medicine

## 2019-10-02 ENCOUNTER — Emergency Department (HOSPITAL_COMMUNITY): Payer: Medicare Other

## 2019-10-02 DIAGNOSIS — Z79899 Other long term (current) drug therapy: Secondary | ICD-10-CM | POA: Diagnosis not present

## 2019-10-02 DIAGNOSIS — E039 Hypothyroidism, unspecified: Secondary | ICD-10-CM | POA: Insufficient documentation

## 2019-10-02 DIAGNOSIS — I5030 Unspecified diastolic (congestive) heart failure: Secondary | ICD-10-CM | POA: Diagnosis not present

## 2019-10-02 DIAGNOSIS — Z96651 Presence of right artificial knee joint: Secondary | ICD-10-CM | POA: Diagnosis not present

## 2019-10-02 DIAGNOSIS — I130 Hypertensive heart and chronic kidney disease with heart failure and stage 1 through stage 4 chronic kidney disease, or unspecified chronic kidney disease: Secondary | ICD-10-CM | POA: Diagnosis not present

## 2019-10-02 DIAGNOSIS — R1010 Upper abdominal pain, unspecified: Secondary | ICD-10-CM | POA: Insufficient documentation

## 2019-10-02 DIAGNOSIS — J449 Chronic obstructive pulmonary disease, unspecified: Secondary | ICD-10-CM | POA: Insufficient documentation

## 2019-10-02 DIAGNOSIS — N181 Chronic kidney disease, stage 1: Secondary | ICD-10-CM | POA: Diagnosis not present

## 2019-10-02 DIAGNOSIS — E1122 Type 2 diabetes mellitus with diabetic chronic kidney disease: Secondary | ICD-10-CM | POA: Diagnosis not present

## 2019-10-02 DIAGNOSIS — Z7982 Long term (current) use of aspirin: Secondary | ICD-10-CM | POA: Diagnosis not present

## 2019-10-02 DIAGNOSIS — Z96652 Presence of left artificial knee joint: Secondary | ICD-10-CM | POA: Insufficient documentation

## 2019-10-02 DIAGNOSIS — Z87891 Personal history of nicotine dependence: Secondary | ICD-10-CM | POA: Insufficient documentation

## 2019-10-02 DIAGNOSIS — R1084 Generalized abdominal pain: Secondary | ICD-10-CM | POA: Diagnosis present

## 2019-10-02 LAB — CBC
HCT: 33.3 % — ABNORMAL LOW (ref 36.0–46.0)
Hemoglobin: 10.9 g/dL — ABNORMAL LOW (ref 12.0–15.0)
MCH: 28.6 pg (ref 26.0–34.0)
MCHC: 32.7 g/dL (ref 30.0–36.0)
MCV: 87.4 fL (ref 80.0–100.0)
Platelets: 323 10*3/uL (ref 150–400)
RBC: 3.81 MIL/uL — ABNORMAL LOW (ref 3.87–5.11)
RDW: 16.8 % — ABNORMAL HIGH (ref 11.5–15.5)
WBC: 2.5 10*3/uL — ABNORMAL LOW (ref 4.0–10.5)
nRBC: 0 % (ref 0.0–0.2)

## 2019-10-02 LAB — COMPREHENSIVE METABOLIC PANEL
ALT: 11 U/L (ref 0–44)
AST: 19 U/L (ref 15–41)
Albumin: 2.4 g/dL — ABNORMAL LOW (ref 3.5–5.0)
Alkaline Phosphatase: 68 U/L (ref 38–126)
Anion gap: 8 (ref 5–15)
BUN: 23 mg/dL (ref 8–23)
CO2: 29 mmol/L (ref 22–32)
Calcium: 8.1 mg/dL — ABNORMAL LOW (ref 8.9–10.3)
Chloride: 99 mmol/L (ref 98–111)
Creatinine, Ser: 1 mg/dL (ref 0.44–1.00)
GFR calc Af Amer: >60 mL/min (ref >60)
GFR calc non Af Amer: 57 mL/min — ABNORMAL LOW (ref >60)
Glucose, Bld: 73 mg/dL (ref 70–99)
Potassium: 3.6 mmol/L (ref 3.5–5.1)
Sodium: 136 mmol/L (ref 135–145)
Total Bilirubin: 0.6 mg/dL (ref 0.3–1.2)
Total Protein: 6.9 g/dL (ref 6.5–8.1)

## 2019-10-02 LAB — LIPASE, BLOOD: Lipase: 35 U/L (ref 11–51)

## 2019-10-02 MED ORDER — HYDROMORPHONE HCL 1 MG/ML IJ SOLN
1.0000 mg | Freq: Once | INTRAMUSCULAR | Status: AC
  Administered 2019-10-02: 1 mg via INTRAVENOUS
  Filled 2019-10-02: qty 1

## 2019-10-02 MED ORDER — ONDANSETRON HCL 4 MG/2ML IJ SOLN
4.0000 mg | Freq: Once | INTRAMUSCULAR | Status: AC
  Administered 2019-10-02: 4 mg via INTRAVENOUS
  Filled 2019-10-02: qty 2

## 2019-10-02 MED ORDER — SODIUM CHLORIDE 0.9% FLUSH
3.0000 mL | Freq: Once | INTRAVENOUS | Status: AC
  Administered 2019-10-02: 3 mL via INTRAVENOUS

## 2019-10-02 MED ORDER — HYDROMORPHONE HCL 1 MG/ML IJ SOLN: 1.0000 mg | Freq: Once | INTRAMUSCULAR | Status: DC

## 2019-10-02 MED ORDER — ONDANSETRON 4 MG PO TBDP: ORAL_TABLET | ORAL | 0 refills | Status: AC

## 2019-10-02 MED ORDER — HYDROCODONE-ACETAMINOPHEN 5-325 MG PO TABS: 1.0000 | ORAL_TABLET | Freq: Four times a day (QID) | ORAL | 0 refills | Status: AC | PRN

## 2019-10-02 NOTE — ED Triage Notes (Signed)
Pt presents today with abdominal pain that started this morning after breakfast. Pt has been dx with gallstones and esophageal dysmobility . Pt saw Dr. Constance Haw on 5/20 for the same. Pt reports pain has been going on x 3 years.

## 2019-10-02 NOTE — Discharge Instructions (Addendum)
Follow-up with your doctor this week for recheck. 

## 2019-10-02 NOTE — ED Provider Notes (Signed)
[facility] EMERGENCY DEPARTMENT Provider Note   CSN: [id number] Arrival date & time: 10/02/19  1413     History Chief Complaint  Patient presents with  . Abdominal Pain    [name] is a 69 y.o. female.  Patient complains of abdominal pain.  Patient has a history of chronic abdominal pain and gallstones.  She has been seen by GI and surgery recently.  No vomiting  The history is provided by the patient. No language interpreter was used.  Abdominal Pain Pain location:  Generalized Pain quality: aching   Pain radiates to:  Does not radiate Pain severity:  Mild Onset quality:  Sudden Timing:  Constant Progression:  Waxing and waning Chronicity:  New Context: not alcohol use   Relieved by:  Nothing Associated symptoms: no chest pain, no cough, no diarrhea, no fatigue and no hematuria        Past Medical History:  Diagnosis Date  . Anterolisthesis    Grade 1, L4-5  . Bronchospasm 05/28/2012  . COPD (chronic obstructive pulmonary disease) ([place])   . DEGENERATIVE JOINT DISEASE 10/06/2006  . DEPRESSION 09/26/2008  . DIABETES MELLITUS 10/06/2006  . Diastolic heart failure ([place])    Echo 10/2018 LVEF 55-60% with impaired relaxation.  . Diverticulosis    [id number]  . GERD (gastroesophageal reflux disease) 07/25/2013  . Glaucoma   . HOCM (hypertrophic obstructive cardiomyopathy) ([place])   . HYPERLIPIDEMIA 01/11/2009  . HYPERTENSION 10/06/2006  . Hypokalemia   . Hypomagnesemia   . Hypothyroidism   . INSOMNIA 09/26/2008  . Internal hemorrhoids   . OBSTRUCTIVE SLEEP APNEA 06/23/2008   Severe OSA per sleep study 2010, Rx a CPAP  . Pain in joint, multiple sites 11/10/2006  . Pericardial effusion   . Pericarditis   . UNSPECIFIED ANEMIA 12/10/2009  . UTI (urinary tract infection) 11/2017    Patient Active Problem List   Diagnosis Date Noted  . Esophageal dysmotility 09/29/2019  . Rib pain on right side 08/18/2019  . Diarrhea 08/12/2019  . Loss of weight 04/28/2019  .  Paroxysmal atrial fibrillation ([place]) 03/01/2019  . Multinodular goiter 01/26/2019  . Regurgitation of food 01/21/2019  . Calculus of gallbladder without cholecystitis without obstruction 01/21/2019  . Numbness and tingling in both hands 12/25/2018  . Chronic cholecystitis with calculus 12/16/2018  . Abnormal LFTs 12/16/2018  . Abnormal weight loss 12/16/2018  . Upper abdominal pain   . Elevated transaminase level   . Hepatic cirrhosis ([place])   . Nausea and vomiting in adult   . Acute cholecystitis 12/09/2018  . Pyrosis 03/12/2018  . History of Helicobacter pylori infection 02/01/2018  . Dysphagia 02/01/2018  . Anxiety and depression   . Hypothyroidism 12/22/2017  . ILD (interstitial lung disease) ([place]) 11/25/2017  . Chronic respiratory failure with hypoxia ([place]) 11/12/2017  . Pericardial effusion   . Paresthesia 10/19/2017  . Neck pain 10/19/2017  . PCP NOTES >>> 02/23/2015  . Nocturnal oxygen desaturation 01/02/2015  . Asthma, chronic 01/02/2015  . Gastroesophageal reflux disease 07/25/2013  . DOE (dyspnea on exertion) 04/25/2013  . Internal hemorrhoids without mention of complication [id number]  . Medicare annual wellness visit, subsequent 06/06/2011  . Anemia 12/10/2009  . SKIN LESION 08/06/2009  . Hyperlipidemia 01/11/2009  . Depression 09/26/2008  . INSOMNIA 09/26/2008  . OBSTRUCTIVE SLEEP APNEA 06/23/2008  . Chest pain 11/18/2007  . Controlled type 2 diabetes mellitus with stage 1 chronic kidney disease, without long-term current use of insulin ([place]) 10/06/2006  . Essential hypertension 10/06/2006  .  Osteoarthritis 10/06/2006    Past Surgical History:  Procedure Laterality Date  . BIOPSY  12/28/2017   Procedure: BIOPSY;  Surgeon: Rush Landmark Telford Nab., MD;  Location: [facility];  Service: Gastroenterology;;  . BIOPSY  05/19/2019   Procedure: BIOPSY;  Surgeon: Daneil Dolin, MD;  [facility];  Service: Endoscopy;;  gastric  . COLONOSCOPY  08/01/2011     Procedure: COLONOSCOPY;  Surgeon: Inda Castle, MD;  Location: [facility];  Service: Endoscopy;  Laterality: N/A;. hyperplastic polyp removed, hemorrhoids s/p banding, diverticulosis, next tcs 10 years.   . ESOPHAGOGASTRODUODENOSCOPY (EGD) WITH PROPOFOL N/A 12/28/2017   Procedure: ESOPHAGOGASTRODUODENOSCOPY (EGD) WITH PROPOFOL;  Surgeon: Rush Landmark Telford Nab., MD;  Location: [facility];  Service: Gastroenterology;  Laterality: N/A; h.pylori gastiris, tortuous esophagus with esophageal bx (no EOE), medium sized hiatal hernia  . ESOPHAGOGASTRODUODENOSCOPY (EGD) WITH PROPOFOL N/A 05/19/2019   Procedure: ESOPHAGOGASTRODUODENOSCOPY (EGD) WITH PROPOFOL; severe reflux esophagitis, medium size hiatal hernia, gastric erosions with biopsies positive for mild chronic gastritis and negative for H. pylori.  No dilation performed.  Marland Kitchen LEFT HEART CATH AND CORONARY ANGIOGRAPHY N/A 10/22/2017   Procedure: LEFT HEART CATH AND CORONARY ANGIOGRAPHY;  Surgeon: Jettie Booze, MD;  Location: [facility];  Service: Cardiovascular;  Laterality: N/A;  . 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 Right knee replacement  2005     OB History   No obstetric history on file.     Family History  Problem Relation Age of Onset  . Asthma Mother   . Stroke Mother   . Diabetes Mother   . Diabetes Other        M, B, S  . Hypertension Sister        M, S,B  . Pancreatic cancer Brother   . Colon cancer Neg Hx   . Prostate cancer Neg Hx   . Breast cancer Neg Hx   . Esophageal cancer Neg Hx   . Liver disease Neg Hx   . Rectal cancer Neg Hx   . Stomach cancer Neg Hx   . Inflammatory bowel disease Neg Hx     Social History   Tobacco Use  . Smoking status: Former Smoker    Packs/day: 0.20    Years: 4.00    Pack years: 0.80    Types: Cigarettes    Quit date: 05/13/1995    Years since quitting: 24.4  . Smokeless tobacco: Never Used  Substance Use Topics  . Alcohol use: Not Currently  . Drug use: No     Home Medications Prior to Admission medications   Medication Sig Start Date End Date Taking? Authorizing Provider  albuterol (VENTOLIN HFA) 108 (90 Base) MCG/ACT inhaler Inhale 2 puffs into the lungs every 4 (four) hours as needed for wheezing or shortness of breath. 10/28/18  Yes Paz, Alda Berthold, MD  alum & mag hydroxide-simeth (MAALOX/MYLANTA) 200-200-20 MG/5ML suspension Take 30 mLs by mouth every 4 (four) hours as needed for indigestion. 12/11/18  Yes Manuella Ghazi, Pratik D, DO  aspirin EC 81 MG tablet Take 1 tablet (81 mg total) by mouth daily. 08/13/18  Yes Paz, Alda Berthold, MD  carvedilol (COREG) 25 MG tablet Take 1 tablet (25 mg total) by mouth 2 (two) times daily. Patient taking differently: Take 25 mg by mouth in the morning and at bedtime.  12/24/18 10/02/19 Yes Dunn, Nedra Hai, PA-C  Cholecalciferol 50 MCG (2000 UT) CAPS Take 1 capsule (2,000 Units total) by mouth daily with breakfast. 05/24/19  Yes  Cassandria Anger, MD  escitalopram (LEXAPRO) 20 MG tablet TAKE 1 TABLET BY MOUTH  DAILY Patient taking differently: Take 20 mg by mouth daily.  03/11/19  Yes Corum, Rex Kras, MD  Fluticasone-Salmeterol (WIXELA INHUB) 250-50 MCG/DOSE AEPB Inhale 1 puff into the lungs 2 (two) times daily. 10/28/18  Yes [provider]  furosemide (LASIX) 20 MG tablet Take 1 tablet (20 mg total) by mouth daily. 06/28/19  Yes Imogene Burn, PA-C  latanoprost (XALATAN) 0.005 % ophthalmic solution Place 1 drop into both eyes at bedtime.   Yes [provider]  levothyroxine (SYNTHROID) 88 MCG tablet Take 1 tablet (88 mcg total) by mouth daily before breakfast. 04/28/19  Yes Nida, Marella Chimes, MD  ondansetron (ZOFRAN) 4 MG tablet Take 1 tablet (4 mg total) by mouth every 8 (eight) hours as needed for nausea or vomiting. 08/11/19  Yes Erenest Rasher, PA-C  OXYGEN Inhale 2 L into the lungs daily as needed (shortness of breath).    Yes [provider]  pantoprazole (PROTONIX) 40 MG tablet Take 1 tablet  (40 mg total) by mouth 2 (two) times daily. 08/11/19 08/10/20 Yes Erenest Rasher, PA-C  potassium chloride SA (KLOR-CON) 20 MEQ tablet Take 1 tablet (20 mEq total) by mouth 2 (two) times daily. 08/16/19  Yes Corum, Rex Kras, MD  predniSONE (DELTASONE) 5 MG tablet Take 5 mg by mouth daily. 07/04/19  Yes [provider]  rosuvastatin (CRESTOR) 40 MG tablet TAKE 1 TABLET BY MOUTH IN  THE EVENING Patient taking differently: Take 40 mg by mouth at bedtime.  03/11/19  Yes Corum, Rex Kras, MD  verapamil (CALAN SR) 120 MG CR tablet Take 1 tablet (120 mg total) by mouth daily. 09/08/19  Yes Evans Lance, MD  HYDROcodone-acetaminophen (NORCO/VICODIN) 5-325 MG tablet Take 1 tablet by mouth every 6 (six) hours as needed. 10/02/19   Milton Ferguson, MD  ondansetron (ZOFRAN ODT) 4 MG disintegrating tablet 4mg  ODT q4 hours prn nausea/vomit 10/02/19   Milton Ferguson, MD    Allergies    Patient has no known allergies.  Review of Systems   Review of Systems  Constitutional: Negative for appetite change and fatigue.  HENT: Negative for congestion, ear discharge and sinus pressure.   Eyes: Negative for discharge.  Respiratory: Negative for cough.   Cardiovascular: Negative for chest pain.  Gastrointestinal: Positive for abdominal pain. Negative for diarrhea.  Genitourinary: Negative for frequency and hematuria.  Musculoskeletal: Negative for back pain.  Skin: Negative for rash.  Neurological: Negative for seizures and headaches.  Psychiatric/Behavioral: Negative for hallucinations.    Physical Exam Updated Vital Signs BP 134/83 (BP Location: Left Arm)   Pulse 82   Temp 98.4 F (36.9 C) (Oral)   Resp 16   Ht 5\' 8"  (1.727 m)   Wt 65.8 kg   SpO2 92%   BMI 22.05 kg/m   Physical Exam Vitals and nursing note reviewed.  Constitutional:      Appearance: She is well-developed.  HENT:     Head: Normocephalic.     Nose: Nose normal.  Eyes:     General: No scleral icterus.    Conjunctiva/sclera:  Conjunctivae normal.  Neck:     Thyroid: No thyromegaly.  Cardiovascular:     Rate and Rhythm: Normal rate and regular rhythm.     Heart sounds: No murmur. No friction rub. No gallop.   Pulmonary:     Breath sounds: No stridor. No wheezing or rales.  Chest:  Chest wall: No tenderness.  Abdominal:     General: There is no distension.     Tenderness: There is no abdominal tenderness. There is no rebound.  Musculoskeletal:        General: Normal range of motion.     Cervical back: Neck supple.  Lymphadenopathy:     Cervical: No cervical adenopathy.  Skin:    Findings: No erythema or rash.  Neurological:     Mental Status: She is alert and oriented to person, place, and time.     Motor: No abnormal muscle tone.     Coordination: Coordination normal.  Psychiatric:        Behavior: Behavior normal.     ED Results / Procedures / Treatments   Labs (all labs ordered are listed, but only abnormal results are displayed) Labs Reviewed  COMPREHENSIVE METABOLIC PANEL - Abnormal; Notable for the following components:      Result Value   Calcium 8.1 (*)    Albumin 2.4 (*)    GFR calc non Af Amer 57 (*)    All other components within normal limits  CBC - Abnormal; Notable for the following components:   WBC 2.5 (*)    RBC 3.81 (*)    Hemoglobin 10.9 (*)    HCT 33.3 (*)    RDW 16.8 (*)    All other components within normal limits  LIPASE, BLOOD    EKG None  Radiology DG ABD ACUTE 2+V W 1V CHEST  Result Date: 10/02/2019 CLINICAL DATA:  Chronic abdominal pain for 3 years, cholelithiasis EXAM: DG ABDOMEN ACUTE W/ 1V CHEST COMPARISON:  09/19/2019 FINDINGS: Supine and upright frontal views of the abdomen as well as an upright frontal view of the chest are obtained. The cardiac silhouette is unremarkable. No acute airspace disease, effusion, or pneumothorax. Bowel gas pattern is unremarkable without obstruction or ileus. Gas is seen throughout the colon to the level of the rectum.  No masses or abnormal calcifications. No free gas in the greater peritoneal sac. There are no acute bony abnormalities. Bilateral hip osteoarthritis, right greater than left. IMPRESSION: 1. Unremarkable bowel gas pattern. 2. No acute intrathoracic process. Electronically Signed   By: Randa Ngo M.D.   On: 10/02/2019 18:33    Procedures Procedures (including critical care time)  Medications Ordered in ED Medications  sodium chloride flush (NS) 0.9 % injection 3 mL (3 mLs Intravenous Given 10/02/19 1836)  HYDROmorphone (DILAUDID) injection 1 mg (1 mg Intravenous Given 10/02/19 1837)  ondansetron (ZOFRAN) injection 4 mg (4 mg Intravenous Given 10/02/19 1835)    ED Course  I have reviewed the triage vital signs and the nursing notes.  Pertinent labs & imaging results that were available during my care of the patient were reviewed by me and considered in my medical decision making (see chart for details).    MDM Rules/Calculators/A&P                      Patient with abdominal pain.  Patient has gallstones and is followed by GI and surgery.  Labs unremarkable except for mild anemia and leukopenia.  Abdominal series negative.  Patient will follow up with GI      This patient presents to the ED for concern of abdominal pain this involves an extensive number of treatment options, and is a complaint that carries with it a high risk of complications and morbidity.  The differential diagnosis includes cholecystitis gastritis   Lab Tests:  I Ordered, reviewed, and interpreted labs, which included CBC and chemistries which showed leukopenia and anemia  Medicines ordered:   I ordered medication Dilaudid for pain  Imaging Studies ordered:   I ordered imaging studies which included *abdominal series and  I independently visualized and interpreted imaging which showed no acute disease  Additional history obtained:   Additional history obtained from records  Previous records  obtained and reviewed   Consultations Obtained:   Reevaluation:  After the interventions stated above, I reevaluated the patient and found mildly improved  Critical Interventions:  .   Final Clinical Impression(s) / ED Diagnoses Final diagnoses:  Pain of upper abdomen    Rx / DC Orders ED Discharge Orders         Ordered    HYDROcodone-acetaminophen (NORCO/VICODIN) 5-325 MG tablet  Every 6 hours PRN     10/02/19 1920    ondansetron (ZOFRAN ODT) 4 MG disintegrating tablet     10/02/19 Luanna Cole, MD 10/02/19 1931

## 2019-10-02 NOTE — Progress Notes (Signed)
Post-test Genetic Consultation notes  Zyionna Pesce is here today for her post-test genetic consult of HCM genetic testing   Dava reports no significant changes in her medical history since we last met. She had to halt thrice on the way to the visit room and expresses significant fatigue. We reviewed her family pedigree and she notes no changes to their medical history.   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 I informed her that this variant has not been reported in HCM patients. While this variant is absent amongst the general population indicating that it is a very rare variant, the molecular features of the variant are not consistent with that of a pathogenic variant. Therefore in the absence of functional assays and population studies to confirm the pathogenicity of this variant, it is classified as a VUS. It is highly likely to be a rare benign variant. Renessa seemed to understand this.  Terrika has three sons. As the TNNT2 A192T variant is a VUS her sons need not pursue genetic testing for the familial variant. It is recommended that they pursue regular surveillance for HCM per AHA guidelines. She is agreeable with this plan of action.  Please note that the patient has not been counseled in this visit on personal, cultural or ethical issues that she may face due to her heart condition.   Lattie Corns, Ph.D, [facility] Clinical Molecular Geneticist

## 2019-10-12 ENCOUNTER — Ambulatory Visit: Payer: Medicare Other | Admitting: Gastroenterology

## 2019-10-15 NOTE — Progress Notes (Signed)
Referring Provider: Maryruth Hancock, MD Primary Care Physician:  Maryruth Hancock, MD Primary GI Physician: Dr. Gala Romney  Chief Complaint  Patient presents with  . Cirrhosis    f/u  . ida    f/u  . Gastroesophageal Reflux    reports she is still vomiting/nausea daily    HPI:   [name] is a 69 y.o. female with a history of IDL, scleroderma following with rheumatology with [facility] on prednisone and colchicine, HOCM, GERD, H. 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 2020, ?chronic cholecystitis, possible cirrhosis with mildly lobular contour of liver on  [place] in July 2020, Hep B and C negative, massive weight loss over the last 2-3 years (greater than 150 lbs), anemia since March 2019 with normal ferritin but low iron and percent saturation.  TIBC also low.  Other PMHx as per below.   Last EGD 05/19/19 with severe reflux esophagitis, medium sized hiatal hernia, gastric erosions s/p biopsied. No dilation performed. Pathology with mild chronic gastritis, negative for H. Pylori.  Last colonoscopy in 2013 with recommendations to repeat in 10 years.   Last seen in our office 08/11/2019 for GERD, dysphagia, N/V, upper abdominal pain, diarrhea, anemia, and cirrhosis.  She continued with daily upper abdominal pain which is felt to be multifactorial in the setting of uncontrolled GERD, severe reflux esophagitis noted on EGD in January 2021, and possible chronic cholecystitis with HIDA in July 2020 suggestive of this.  Had seen general surgery in September 2020 to consider cholecystectomy but this was put on hold due to evaluation for HOCM and symptoms possibly related to esophageal motility problems and GERD.  Notably, patient was taking Protonix 20 mg daily rather than 40 mg twice daily.  Dysphagia continued but foods would go down with water.  Nausea with vomiting occurring before  or after meals as well as in the middle of the night.  Suspected this could be secondary to chronic cholecystitis versus uncontrolled GERD.  Also noted 3-4 months of up to 3-4 loose stools a day, occasional nocturnal stools.  Worse with dairy.  No associated abdominal pain.  No association with meals.  Differentials included dietary intolerances, infectious diarrhea, IBD.  Regarding cirrhosis, she reported intermittent abdominal swelling and heaviness for couple of months, chronic intermittent swelling in her ankles on Lasix 20 mg without change, and forgetfulness x3-4 months.  Regarding anemia, hemoglobin was stable over the last several months around 10.  She denied BRBPR or melena.  Total iron and percent saturation were found to be low in August 2020 but ferritin normal at 201.  Suspected anemia to be multifactorial in the setting of chronic disease.  However, considering significant weight loss, recommended pursuing colonoscopy.  Patient wanted to hold off on this.  Plan included updating labs including celiac serologies, stool studies, and abdominal ultrasound, increase Protonix to 40 mg twice daily, Zofran 4 mg every 8 hours as needed, avoid fried/fatty foods and follow a GERD diet, lactose-free diet, low-sodium diet, continue Lasix 20 mg daily,   Labs 08/17/2019: Hemoglobin 11.3 (low but stable), WBC 2.5 (low but stable), albumin low at 2.8, LFTs and kidney function within normal limits, INR normal at 1.1, AFP within normal limits, ammonia within normal limits, immune to hepatitis A, no immunity to hepatitis B.  No evidence of celiac disease.  Patient states she would follow-up with PCP to get hep B  vaccine. MELD 7 Stool studies collected 10/14/19: C. Diff negative. GI pathogen panel negative.   Ultrasound abdomen 08/30/2019: Contracted gallbladder, gallbladder wall up to 5 mm, gallstone near gallbladder neck measuring 11 mm, no pericholecystic fluid.  Hepatic steatosis.  Possible solid mass of the  superior pole of the left kidney. CT ordered and she was referred back to Dr. Constance Haw for possible cholecystectomy.  CT A/P with and without contrast 09/19/19: No left kidney lesion identified to correspond to ultrasound abnormality.  Several scattered cystic structures within the head and body of the pancreas with recommendations for pancreatic protocol MRI in 24 months.  Also with diffuse body wall edema compatible with anasarca.  Patient denied any significant change in swelling since her last office visit.  OV with Dr. Constance Haw 09/29/19.  Patient reported nausea and vomiting solid food that looked at least the same color as it did when it went down.  Generalized abdominal pain.  Right side hurts worse at night and cannot sleep on that side.  Dr. Constance Haw was not convinced that her symptoms are related to her gallbladder.  Also concerned that risk outweigh benefits in regards to cholecystectomy considering her multiple comorbidities, prednisone use, oxygen use, weight loss, and frailty.  Felt her problems were more driven by esophageal motility issue.  Discussed getting a second opinion but patient was upset and frustrated and did not want to pursue surgery if it would not likely make her better.  Today: Weight seems fairly stable, up and down within 10 pounds over the last 6 months. 10/17/2019: 150 lbs 10/02/2019: 145 lbs 09/08/2019: 155 lbs 08/11/2019: 148 lbs 06/28/2019: 158 lbs 04/19/2019: 148 lbs   Nausea/vomiting daily. Taking zofran once a day.  Vomiting after all meals within 30 minutes - 1 hour. No hematemesis. Rare nocturnal vomiting. Stomach is sore after vomiting so much.  GERD symptoms daily. Taking Protonix BID usually. Forgets some nights. Taking Mylanta as needed, typically daily. Burning in chest and esophagus. Abdomen makes a lot of gurgling noises. Generalized abdominal discomfort. More so above the belly button. Not specifically worsened by meals or BMs.    Dysphagia with foods sticking  in esophagus early in the morning. Sausage. No trouble with soft foods. Ok during the day.    Drinking ensure when she doesn't eat well. Able to keep that down. No vomiting so far today.    Stool incontinence at times. Can't get to the toilet in time. Up to 3 BMs a day. Some are very watery, some are soft/mushy. No hard stools. No blood in the stool. No black stool. Has a lot of gas. No abdominal pain specifically related to BMs. Trying to limit fried food and dairy. Gets frustrated when I try to ask specifics about this.   Ankles swell. Somewhat worse. Taking Lasix daily. Abdomen is up and down throughout the day. No bruising or bleeding. No mental status changes. Forgets what she is doing at times which is chronic. Not worsening.   Some dizziness with position changes. No recent falls. Occasional chest pain. Occasional palpitations when laying down. SOB chronically. Wears 2L O2 at home when needed and wears CPAP at night.   Brother may have had pancreatic cancer. No personal history of pancreatitis. No alcohol. No tylenol. No NSAIDs other than baby aspirin.   Has an aid that comes in daily to help with ADLs.   Wants to hold off on colonscopy for now.   Past Medical History:  Diagnosis Date  . Anterolisthesis  Grade 1, L4-5  . Bronchospasm 05/28/2012  . COPD (chronic obstructive pulmonary disease) ([place])   . DEGENERATIVE JOINT DISEASE 10/06/2006  . DEPRESSION 09/26/2008  . DIABETES MELLITUS 10/06/2006  . Diastolic heart failure ([place])    Echo 10/2018 LVEF 55-60% with impaired relaxation.  . Diverticulosis    [id number]  . GERD (gastroesophageal reflux disease) 07/25/2013  . Glaucoma   . HOCM (hypertrophic obstructive cardiomyopathy) ([place])   . HYPERLIPIDEMIA 01/11/2009  . HYPERTENSION 10/06/2006  . Hypokalemia   . Hypomagnesemia   . Hypothyroidism   . INSOMNIA 09/26/2008  . Internal hemorrhoids   . OBSTRUCTIVE SLEEP APNEA 06/23/2008   Severe OSA per sleep study 2010, Rx a CPAP  . Pain  in joint, multiple sites 11/10/2006  . Pericardial effusion   . Pericarditis   . UNSPECIFIED ANEMIA 12/10/2009  . UTI (urinary tract infection) 11/2017    Past Surgical History:  Procedure Laterality Date  . BIOPSY  12/28/2017   Procedure: BIOPSY;  Surgeon: Rush Landmark Telford Nab., MD;  Location: [place];  Service: Gastroenterology;;  . BIOPSY  05/19/2019   Procedure: BIOPSY;  Surgeon: Daneil Dolin, MD;  [facility];  Service: Endoscopy;;  gastric  . COLONOSCOPY  08/01/2011   Procedure: COLONOSCOPY;  Surgeon: Inda Castle, MD;  Location: [facility];  Service: Endoscopy;  Laterality: N/A;. hyperplastic polyp removed, hemorrhoids s/p banding, diverticulosis, next tcs 10 years.   . ESOPHAGOGASTRODUODENOSCOPY (EGD) WITH PROPOFOL N/A 12/28/2017   Procedure: ESOPHAGOGASTRODUODENOSCOPY (EGD) WITH PROPOFOL;  Surgeon: Rush Landmark Telford Nab., MD;  Location: [place];  Service: Gastroenterology;  Laterality: N/A; h.pylori gastiris, tortuous esophagus with esophageal bx (no EOE), medium sized hiatal hernia  . ESOPHAGOGASTRODUODENOSCOPY (EGD) WITH PROPOFOL N/A 05/19/2019   Procedure: ESOPHAGOGASTRODUODENOSCOPY (EGD) WITH PROPOFOL; severe reflux esophagitis, medium size hiatal hernia, gastric erosions with biopsies positive for mild chronic gastritis and negative for H. pylori.  No dilation performed.  Marland Kitchen LEFT HEART CATH AND CORONARY ANGIOGRAPHY N/A 10/22/2017   Procedure: LEFT HEART CATH AND CORONARY ANGIOGRAPHY;  Surgeon: Jettie Booze, MD;  Location: [place] [facility];  Service: Cardiovascular;  Laterality: N/A;  . 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 Right knee replacement  2005    Current Outpatient Medications  Medication Sig Dispense Refill  . albuterol (VENTOLIN HFA) 108 (90 Base) MCG/ACT inhaler Inhale 2 puffs into the lungs every 4 (four) hours as needed for wheezing or shortness of breath. 18 g 5  . alum & mag hydroxide-simeth (MAALOX/MYLANTA) 200-200-20 MG/5ML  suspension Take 30 mLs by mouth every 4 (four) hours as needed for indigestion. 355 mL 0  . aspirin EC 81 MG tablet Take 1 tablet (81 mg total) by mouth daily. 90 tablet 3  . Cholecalciferol 50 MCG (2000 UT) CAPS Take 1 capsule (2,000 Units total) by mouth daily with breakfast. 90 capsule 1  . escitalopram (LEXAPRO) 20 MG tablet TAKE 1 TABLET BY MOUTH  DAILY (Patient taking differently: Take 20 mg by mouth daily. ) 90 tablet 3  . Fluticasone-Salmeterol (WIXELA INHUB) 250-50 MCG/DOSE AEPB Inhale 1 puff into the lungs 2 (two) times daily.    . furosemide (LASIX) 20 MG tablet Take 1 tablet (20 mg total) by mouth daily. 90 tablet 3  . HYDROcodone-acetaminophen (NORCO/VICODIN) 5-325 MG tablet Take 1 tablet by mouth every 6 (six) hours as needed. 20 tablet 0  . latanoprost (XALATAN) 0.005 % ophthalmic solution Place 1 drop into both eyes at bedtime.    Marland Kitchen levothyroxine (SYNTHROID) 88 MCG tablet  Take 1 tablet (88 mcg total) by mouth daily before breakfast. 90 tablet 1  . ondansetron (ZOFRAN ODT) 4 MG disintegrating tablet 4mg  ODT q4 hours prn nausea/vomit 12 tablet 0  . ondansetron (ZOFRAN) 4 MG tablet Take 1 tablet (4 mg total) by mouth every 8 (eight) hours as needed for nausea or vomiting. 30 tablet 1  . OXYGEN Inhale 2 L into the lungs daily as needed (shortness of breath).     . potassium chloride SA (KLOR-CON) 20 MEQ tablet Take 1 tablet (20 mEq total) by mouth 2 (two) times daily. 180 tablet 1  . predniSONE (DELTASONE) 5 MG tablet Take 5 mg by mouth daily.    . rosuvastatin (CRESTOR) 40 MG tablet TAKE 1 TABLET BY MOUTH IN  THE EVENING (Patient taking differently: Take 40 mg by mouth at bedtime. ) 90 tablet 3  . verapamil (CALAN SR) 120 MG CR tablet Take 1 tablet (120 mg total) by mouth daily. 30 tablet 6  . carvedilol (COREG) 25 MG tablet Take 1 tablet (25 mg total) by mouth 2 (two) times daily. (Patient taking differently: Take 25 mg by mouth in the morning and at bedtime. ) 60 tablet 11  .  dexlansoprazole (DEXILANT) 60 MG capsule Take 1 capsule (60 mg total) by mouth daily. 90 capsule 1  . Pancrelipase, Lip-Prot-Amyl, (ZENPEP) 25000-79000 units CPEP Take 2 capsules (50,000 units) with breakfast lunch and dinner.  Take 1 capsule (25,000 units) with snacks up to twice daily. 240 capsule 3   No current facility-administered medications for this visit.    Allergies as of 10/17/2019  . (No Known Allergies)    Family History  Problem Relation Age of Onset  . Asthma Mother   . Stroke Mother   . Diabetes Mother   . Diabetes Other        M, B, S  . Hypertension Sister        M, S,B  . Pancreatic cancer Brother   . Colon cancer Neg Hx   . Prostate cancer Neg Hx   . Breast cancer Neg Hx   . Esophageal cancer Neg Hx   . Liver disease Neg Hx   . Rectal cancer Neg Hx   . Stomach cancer Neg Hx   . Inflammatory bowel disease Neg Hx     Social History   Socioeconomic History  . Marital status: Widowed    Spouse name: Not on file  . Number of children: 4  . Years of education: Not on file  . Highest education level: Not on file  Occupational History  . Occupation: disability    Employer: UNEMPLOYED  Tobacco Use  . Smoking status: Former Smoker    Packs/day: 0.20    Years: 4.00    Pack years: 0.80    Types: Cigarettes    Quit date: 05/13/1995    Years since quitting: 24.4  . Smokeless tobacco: Never Used  Substance and Sexual Activity  . Alcohol use: Not Currently  . Drug use: No  . Sexual activity: Not Currently  Other Topics Concern  . Not on file  Social History Narrative   Widow , lives by herself   Lost a son, 3 living    No children in [place], [place] lives in [place], he visits and helps w/ shopping    Lost husband   Social Determinants of Health   Financial Resource Strain:   . Difficulty of Paying Living Expenses:   Food Insecurity:   . Worried About Running  Out of Food in the Last Year:   . [place] in the Last Year:   Transportation Needs:    . Lack of Transportation (Medical):   Marland Kitchen Lack of Transportation (Non-Medical):   Physical Activity:   . Days of Exercise per Week:   . Minutes of Exercise per Session:   Stress:   . Feeling of Stress :   Social Connections:   . Frequency of Communication with Friends and Family:   . Frequency of Social Gatherings with Friends and Family:   . Attends Religious Services:   . Active Member of Clubs or Organizations:   . Attends Archivist Meetings:   Marland Kitchen Marital Status:     Review of Systems: Gen: Denies fever, chills CV: See HPI Resp: See HPI GI: See HPI. Heme: See HPI  Physical Exam: BP 101/65   Pulse 79   Temp (!) 97.2 F (36.2 C) (Oral)   Ht 5\' 8"  (1.727 m)   Wt 150 lb 6.4 oz (68.2 kg)   BMI 22.87 kg/m  General:   Alert and oriented. No distress noted. Frail appearing.  Walking with a walker.  Unsteady on her feet. Head:  Normocephalic and atraumatic. Eyes:  Conjuctiva clear without scleral icterus. Heart:  S1, S2 present without murmurs appreciated. Lungs: Mild crackles in the lower lung fields. No rhonchi, or wheezing. No distress.  Abdomen: +BS, soft, non-distended. Minimal tenderness to palpation in the epigastric area. No other TTP. No rebound or guarding. No HSM or masses noted. Msk:  Symmetrical without gross deformities. Normal posture. Extremities:  With 2+ bilateral LE pitting edema up mid shin.  Neurologic:  Alert and  oriented x4 Psych:  Gets frustrated easily throughout the visit when asking specifics about her symptoms.

## 2019-10-17 ENCOUNTER — Ambulatory Visit (INDEPENDENT_AMBULATORY_CARE_PROVIDER_SITE_OTHER): Payer: Medicare Other | Admitting: Gastroenterology

## 2019-10-17 ENCOUNTER — Telehealth: Payer: Self-pay

## 2019-10-17 ENCOUNTER — Other Ambulatory Visit: Payer: Self-pay

## 2019-10-17 ENCOUNTER — Encounter: Payer: Self-pay | Admitting: Gastroenterology

## 2019-10-17 VITALS — BP 101/65 | HR 79 | Temp 97.2°F | Ht 68.0 in | Wt 150.4 lb

## 2019-10-17 DIAGNOSIS — D649 Anemia, unspecified: Secondary | ICD-10-CM

## 2019-10-17 DIAGNOSIS — K862 Cyst of pancreas: Secondary | ICD-10-CM

## 2019-10-17 DIAGNOSIS — R109 Unspecified abdominal pain: Secondary | ICD-10-CM

## 2019-10-17 DIAGNOSIS — R1310 Dysphagia, unspecified: Secondary | ICD-10-CM

## 2019-10-17 DIAGNOSIS — R112 Nausea with vomiting, unspecified: Secondary | ICD-10-CM

## 2019-10-17 DIAGNOSIS — K7460 Unspecified cirrhosis of liver: Secondary | ICD-10-CM | POA: Diagnosis not present

## 2019-10-17 DIAGNOSIS — R634 Abnormal weight loss: Secondary | ICD-10-CM

## 2019-10-17 DIAGNOSIS — R197 Diarrhea, unspecified: Secondary | ICD-10-CM

## 2019-10-17 DIAGNOSIS — K2100 Gastro-esophageal reflux disease with esophagitis, without bleeding: Secondary | ICD-10-CM | POA: Diagnosis not present

## 2019-10-17 DIAGNOSIS — R1010 Upper abdominal pain, unspecified: Secondary | ICD-10-CM

## 2019-10-17 LAB — GASTROINTESTINAL PATHOGEN PANEL PCR
C. difficile Tox A/B, PCR: NOT DETECTED
Campylobacter, PCR: NOT DETECTED
Cryptosporidium, PCR: NOT DETECTED
E coli (ETEC) LT/ST PCR: NOT DETECTED
E coli (STEC) stx1/stx2, PCR: NOT DETECTED
E coli 0157, PCR: NOT DETECTED
Giardia lamblia, PCR: NOT DETECTED
Norovirus, PCR: NOT DETECTED
Rotavirus A, PCR: NOT DETECTED
Salmonella, PCR: NOT DETECTED
Shigella, PCR: NOT DETECTED

## 2019-10-17 LAB — C. DIFFICILE GDH AND TOXIN A/B
GDH ANTIGEN: NOT DETECTED
MICRO NUMBER:: 10555738
SPECIMEN QUALITY:: ADEQUATE
TOXIN A AND B: NOT DETECTED

## 2019-10-17 MED ORDER — ZENPEP 25000-79000 UNITS PO CPEP: ORAL_CAPSULE | ORAL | 3 refills | Status: AC

## 2019-10-17 MED ORDER — DEXILANT 60 MG PO CPDR: 60.0000 mg | DELAYED_RELEASE_CAPSULE | Freq: Every day | ORAL | 1 refills | Status: AC

## 2019-10-17 NOTE — Progress Notes (Unsigned)
m °

## 2019-10-17 NOTE — Telephone Encounter (Signed)
PA for GES submitted via [facility] website. Case approved. PA# [id number], valid 10/17/19-12/01/19.  GES scheduled for 10/27/19 at 8:00am, arrive at 7:45am. NPO after midnight prior to test and no stomach medications morning of test.  Called and informed pt of GES appt. Letter mailed.

## 2019-10-17 NOTE — Patient Instructions (Addendum)
Please have labs completed.  I am rechecking her kidney function and electrolytes.  If everything looks stable, I am going to add a new medication to help with your fluid accumulation in your legs.  We will help arrange for you to have a gastric emptying study.  This is to help evaluate your daily nausea and vomiting after meals.  We will also get you scheduled for an MRI to further evaluate pancreatic cyst noted on prior imaging.  Please stop Protonix and start Dexilant 60 mg once daily.  Be sure this is separated from levothyroxine 3-4 hours.  Please take Zofran before breakfast lunch and dinner to help prevent nausea/vomiting.  Follow a GERD diet:  Avoid fried, fatty, greasy, spicy, citrus foods. Avoid caffeine and carbonated beverages. Avoid chocolate. Try eating 4-6 small meals a day rather than 3 large meals. Do not eat within 3 hours of laying down. Prop head of bed up on wood or bricks to create a 6 inch incline.  You do have an esophageal motility disorder meaning that your esophagus does not contract properly to push your food down to your stomach.  You should avoid tough textured foods.  All meats should be ground finely.  Be sure to eat slowly, take small bites, chew thoroughly, and drink plenty of liquids throughout your meal.  Start Zenpep (pancreatic enzymes) to help with your diarrhea and absorption of nutrients.  You will take 2 capsules with breakfast lunch and dinner and 1 capsule with snacks.  I am providing you with a few samples today.  The prescription has also been sent to your pharmacy.  Continue taking Lasix 20 mg daily. Be sure you are following a low-sodium diet.  No more than 2 g daily.  This includes all fluid and food intake.  We will plan to follow-up with you in 2 months.  Aliene Altes, PA-C [facility]

## 2019-10-18 ENCOUNTER — Encounter: Payer: Self-pay | Admitting: Gastroenterology

## 2019-10-18 ENCOUNTER — Telehealth: Payer: Self-pay | Admitting: Gastroenterology

## 2019-10-18 NOTE — Assessment & Plan Note (Addendum)
Chronic mild anemia since March 2019.  Also with leukopenia.  Hemoglobin stable over the last several months in the upper 9/10 range.  No BRBPR or melena.  August 2020, ferritin 201, iron and percent saturation low.  EGD January 2021 with severe reflux esophagitis and gastric erosions, pathology with mild chronic gastritis, negative H. pylori.  Celiac serologies negative in April 2021. Last colonoscopy 2013 with recommendations to repeat in 10 years.  She had massive weight loss over the last 2-3 years of greater than 150 pounds unintentionally.  Anemia is multifactorial and likely secondary to chronic disease.  Considering anemia and massive weight loss, we have discussed updating her colonoscopy; however, patient desires to continue to hold off on this due to ongoing significant upper GI issues.  Will discuss again at next office visit in 2 months.

## 2019-10-18 NOTE — Assessment & Plan Note (Addendum)
Massive weight loss of greater than 150 pounds over the last 2+ years that has been unintentional.  She has chronic GI issues with uncontrolled GERD, upper abdominal pain, postprandial nausea and vomiting, dysphagia, and loose stools.  Notably, she has multiple other comorbidities including likely cirrhosis, ILD, HOCM, and recently diagnosed scleroderma. Weight has been fairly stable over the last 6 months in the mid 140s-150 range.  Stable mild anemia present since March 2019.  Also noted to have leukopenia.  No true iron deficiency with ferritin normal although iron, TIBC, and percent saturation all low.  B12 is within normal limits.  TSH was normal.  EGD 05/19/2019 with severe reflux esophagitis, medium-sized anal hernia, gastric erosions biopsied revealing mild chronic gastritis, no H. Pylori.  Ultrasound abdomen April 2021 with possible solid mass in the left kidney.  Follow-up CT A/P with and without contrast with no correlating findings in the kidney.  She did have several scattered cystic structures within the head of the body of the pancreas.  Denies any history of pancreatitis but does report she thinks her brother had pancreatic cancer.  Last colonoscopy in 2013 with normal exam.  Weight loss is multifactorial in this very complex patient.  Ongoing significant upper GI symptoms likely contributing as she is having nausea and vomiting daily.  Query whether scleroderma may be contributing to possible delayed stomach emptying and possible malabsorption/possible pancreatic insufficiency.  Considering family history of pancreatic cancer, will go ahead and pursue MRI to further evaluate pancreatic cystic structures.  She has seen Dr. Constance Haw for consideration of cholecystectomy due to possible chronic cholecystitis; however, Dr. Constance Haw does not feel her gallbladder is the main issue and feels risk outweigh benefits of surgery.  Considering massive weight loss, colonoscopy has been recommended, but patient  prefers to hold off for now as nausea and vomiting are her main concern.  Stop Protonix and start Dexilant 60 mg daily. Take Zofran scheduled before meals 3 times a day. Schedule gastric emptying study. Schedule MRI/MRCP abdomen with and without contrast. Counseled on GERD diet and lifestyle. Start Zenpep 50,000 units with meals 3 times daily and 25,000 units with snacks up to twice daily. We will discuss scheduling colonoscopy at next visit. Follow-up in 2 months.

## 2019-10-18 NOTE — Assessment & Plan Note (Addendum)
Suspected cirrhosis with mildly lobular contour of the liver on ultrasound in July 2020.  Hepatitis B and C negative. No immunity to Hep B. Immune to Hep A. Iron panel with no suggestion of hemochromatosis.  No other significant work-up has been completed at this time.  LFTs remain within normal limits and labs completed in April 2021 correlating to meld 7.  AFP also within normal limits.  Interestingly, abdominal ultrasound in April 2021 and CT A/P with and without contrast May 2021 only with findings to suggest hepatic steatosis.  Spleen within normal limits.  Platelets also remained within normal limits.  EGD on file from January 2021 without varices.  She does have chronic swelling in her lower extremities currently on Lasix 20 mg daily.  On exam, 2+ bilateral lower extremity pitting edema up to her mid shin which does seem slightly worsened compared to previous visit in April. No abdominal distension. No other signs or symptoms of decompensated disease. No alcohol.   At this point, I am not entirely convinced patient has cirrhosis.  Lower extremity edema may be secondary to diastolic heart failure/HOCM.  I do not feel any additional significant laboratory work-up is needed to evaluate etiology.  As she does have history of obesity and diabetes (although resolved with massive weight loss over the last few years), it is possible cirrhosis could be related to NAFLD.  Considered adding Aldactone to help with lower extremity edema.  However, with ongoing N/B, preferred to update her kidney function to ensure this has remained stable.  Diuretics may also be limited by blood pressure as her blood pressure was 101/65 today.  Continue Lasix 20 mg daily. Update BMP. Counseled on a low-sodium diet.  No more than 2 g daily. Still needs Hep B vaccine.  Follow-up in 2 months due to significant upper GI issues.

## 2019-10-18 NOTE — Assessment & Plan Note (Signed)
Several scattered cystic structures within the head and body of the pancreas noted on CT in May 2021.  Pancreatic cyst were not noted CT in August 2019.  Patient has no history of pancreatitis.  No alcohol use.  Reports thinking brother had pancreatic cancer.  Notably, patient has had massive unintentional weight loss over the last 2+ years (greater than 102 pounds).  This time, we will go ahead and pursue MRI to further characterize the cyst and rule out any concerning features.

## 2019-10-18 NOTE — Assessment & Plan Note (Signed)
Continues with dysphagia.  Notes symptoms seem to be more prominent in the mornings when eating sausage.  No trouble with soft foods.  Does okay during the day.  This is multifactorial as she does have significant esophageal dysmotility noted on BPE in October 2020.  Also has severe reflux esophagitis on EGD in January 2021 with no dilation performed.  Suspect esophagitis is also contributing to her dysphagia symptoms.  Unfortunately, GERD symptoms continue to be uncontrolled on Protonix 40 mg BID (although patient admits to forgetting second dose at times).  At this time, we will stop Protonix and start Dexilant 60 mg daily in hopes of better compliance with medications as well as better control of GERD symptoms.  We discussed a GERD diet and lifestyle (see AVS).  Also advised she follow a soft textured diet with all meats chopped finely, and to ensure she drinks plenty of water throughout meals.  Follow-up in 2 months.

## 2019-10-18 NOTE — Assessment & Plan Note (Addendum)
Symptoms not adequately controlled on Protonix 40 mg twice daily.  Notably, patient does admit to forgetting the second dose of Protonix at times.  We will plan to stop Protonix and start Dexilant 60 mg daily in hopes for better compliance as well as better symptom control.  Follow-up in 2 months.  *Will request nursing staff to call patient in 2 weeks to follow-up on reflux symptoms.  Could consider adding Carafate if reflux continues to be uncontrolled.  This may also help with upper abdominal discomfort (discussed below).

## 2019-10-18 NOTE — Assessment & Plan Note (Signed)
Ongoing daily upper abdominal pain likely multifactorial in the setting of uncontrolled GERD on Protonix twice daily ( patient does miss evening dose at times), severe reflux esophagitis noted on EGD in January 2021, possible chronic cholecystitis with HIDA completed in July 2020 suggestive of this.  Ultrasound in April 2021 with contracted gallbladder, gallbladder wall up to 5 mm, gallstone near gallbladder neck, no pericholecystic fluid.  Follow-up with general surgery in May 2021.  Dr. Constance Haw did not feel patient's gallbladder was her primary issue and also felt risk outweighed benefit of cholecystectomy.  Considering fairly new diagnosis of scleroderma, I am also considering whether or not patient has gastroparesis.  Abdominal exam only with minimal tenderness palpation in the epigastric area.  Start Protonix and start Dexilant 60 mg daily. Schedule gastric emptying study. Will request nursing staff to call patient in 2 weeks to follow-up on reflux symptoms.  Could consider adding Carafate if reflux continues to be uncontrolled.  This may also help with upper abdominal discomfort. Follow-up in 2 months.

## 2019-10-18 NOTE — Assessment & Plan Note (Addendum)
Up to 3 soft/mushy to watery BMs daily.  Stool consistency changed about 6 months ago.  Associated urgency. Denies worsening abdominal pain related to BMs specifically. Trying to limit fried foods and dairy.  Last colonoscopy in 2013, due for repeat in 2023.  Mild anemia since March 2019 with hemoglobin stable in the upper 9-10 range over the last several months.  No true iron deficiency.  August 2020, ferritin 201, iron and percent saturation were low (more consistent with anemia of chronic disease).  Notably, massive weight loss of greater than 150 pounds over the last 2+ years, unintentionally.  Over the last 6 months, weight has fluctuated up and down within 10 pounds.  Celiac serologies negative in April 2021.  C. difficile and GI pathogen panel negative on 10/14/2019.  As patient is not having profuse watery diarrhea, I am less suspicious for microscopic colitis.  Also not entirely consistent with IBS.  Additionally, with patient being a high fall risk, would not want to trial Bentyl at this time due to side effects of dizziness.  Query whether she may have pancreatic insufficiency with history of scleroderma and possibly small bowel malabsorption.  SIBO as well as dietary intolerances in the differential.  Not entirely consistent with IBD but this is in the differential with anemia.  Discussed continuing lactose-free diet or taking Lactaid tablets prior to dairy consumption. Advised to avoid fried, fatty, greasy foods. Start Zenpep 50,000 units with meals 3 times a day and 25,000 units with snacks up to twice a day. Follow-up in 2 months. Discussed updating colonoscopy (consider anemia and weight loss) but patient desires to hold off due to ongoing upper GI issues. Will discuss at follow-up. Follow-up in 2 months.   *Will request nursing staff to call patient in 2 weeks to see if pancreatic enzymes have helped the diarrhea.  We do have room to increase dosing if diarrhea is not improved.

## 2019-10-18 NOTE — Telephone Encounter (Signed)
[name], can you call patient in 2 weeks to see how she is doing with GERD and diarrhea? I changed Protonix to Dexilant and added Zenpep for diarrhea at her visit on 6/7. Also discussed following lactose free and low fat diet.

## 2019-10-18 NOTE — Assessment & Plan Note (Addendum)
Patient continues to have frequent nausea and vomiting without hematemesis, typically within 30 minutes - 1 hour after meals which has been present for several months at this point.  Also with generalized abdominal discomfort but more so in the upper abdomen not specifically worsened by meals or bowel movements.  HIDA scan in July 2020 with concern for biliary dyskinesia or chronic cholecystitis.  Ultrasound April 2021 with contracted gallbladder, gallbladder wall up to 5 mm, gallstone near gallbladder neck, no pericholecystic fluid.  Follow-up with Dr. Constance Haw with general surgery in May 2021.  Dr. Constance Haw did not feel gallbladder was patient's primary issue and also felt risk outweigh benefits of cholecystectomy.  EGD January 2021 with severe reflux esophagitis, medium size hiatal hernia, gastric erosions with mild chronic gastritis on pathology, no H. Pylori.   Nausea/vomiting is likely multifactorial.  GERD continues to be uncontrolled on Protonix BID (although patient does forget evening dose at times).  Also with severe esophageal dysmotility on BPE in October 2020.  Considering fairly new diagnosis of scleroderma, query whether patient may have delayed gastric emptying.  Cannot rule out influence of biliary/gallbladder issue.  Stop Protonix and start Dexilant 60 mg daily. Take Zofran 4 mg scheduled before breakfast lunch and dinner. Schedule gastric emptying study. Counseled on GERD diet and lifestyle.  Discussed food/liquids to avoid, eating 4-6 small meals daily, not eating within 3 hours of going to bed, and propping head of bed up 6 inches. Advise she follow a soft texture diet with all meats ground finely.  She should eat slowly, take small bites, chew thoroughly, and drink plenty of liquids throughout your meals. Follow-up in 2 months.

## 2019-10-19 NOTE — Telephone Encounter (Signed)
Noted  

## 2019-10-27 ENCOUNTER — Other Ambulatory Visit (HOSPITAL_COMMUNITY): Payer: Medicare Other

## 2019-10-28 ENCOUNTER — Other Ambulatory Visit: Payer: Self-pay | Admitting: *Deleted

## 2019-10-28 DIAGNOSIS — L819 Disorder of pigmentation, unspecified: Secondary | ICD-10-CM

## 2019-10-31 ENCOUNTER — Other Ambulatory Visit: Payer: Self-pay | Admitting: Internal Medicine

## 2019-11-01 ENCOUNTER — Inpatient Hospital Stay (HOSPITAL_COMMUNITY)
Admission: EM | Admit: 2019-11-01 | Discharge: 2019-11-21 | DRG: 637 | Disposition: A | Discharge status: Hospice | Payer: Medicare Other | Source: Ambulatory Visit | Attending: Family Medicine | Admitting: Family Medicine

## 2019-11-01 ENCOUNTER — Ambulatory Visit (INDEPENDENT_AMBULATORY_CARE_PROVIDER_SITE_OTHER): Payer: Medicare Other | Admitting: "Endocrinology

## 2019-11-01 ENCOUNTER — Encounter (HOSPITAL_COMMUNITY): Payer: Self-pay

## 2019-11-01 ENCOUNTER — Other Ambulatory Visit: Payer: Self-pay

## 2019-11-01 ENCOUNTER — Encounter: Payer: Self-pay | Admitting: "Endocrinology

## 2019-11-01 VITALS — BP 82/58 | HR 76 | Ht 68.0 in | Wt 152.0 lb

## 2019-11-01 DIAGNOSIS — K7460 Unspecified cirrhosis of liver: Secondary | ICD-10-CM | POA: Diagnosis present

## 2019-11-01 DIAGNOSIS — E039 Hypothyroidism, unspecified: Secondary | ICD-10-CM | POA: Diagnosis present

## 2019-11-01 DIAGNOSIS — K224 Dyskinesia of esophagus: Secondary | ICD-10-CM | POA: Diagnosis present

## 2019-11-01 DIAGNOSIS — G4700 Insomnia, unspecified: Secondary | ICD-10-CM | POA: Diagnosis present

## 2019-11-01 DIAGNOSIS — K862 Cyst of pancreas: Secondary | ICD-10-CM | POA: Diagnosis present

## 2019-11-01 DIAGNOSIS — R0902 Hypoxemia: Secondary | ICD-10-CM

## 2019-11-01 DIAGNOSIS — R64 Cachexia: Secondary | ICD-10-CM | POA: Diagnosis present

## 2019-11-01 DIAGNOSIS — M7989 Other specified soft tissue disorders: Secondary | ICD-10-CM | POA: Diagnosis present

## 2019-11-01 DIAGNOSIS — E785 Hyperlipidemia, unspecified: Secondary | ICD-10-CM | POA: Diagnosis present

## 2019-11-01 DIAGNOSIS — Z7189 Other specified counseling: Secondary | ICD-10-CM

## 2019-11-01 DIAGNOSIS — E162 Hypoglycemia, unspecified: Secondary | ICD-10-CM

## 2019-11-01 DIAGNOSIS — R112 Nausea with vomiting, unspecified: Secondary | ICD-10-CM

## 2019-11-01 DIAGNOSIS — I110 Hypertensive heart disease with heart failure: Secondary | ICD-10-CM | POA: Diagnosis present

## 2019-11-01 DIAGNOSIS — R1110 Vomiting, unspecified: Secondary | ICD-10-CM | POA: Diagnosis present

## 2019-11-01 DIAGNOSIS — R946 Abnormal results of thyroid function studies: Secondary | ICD-10-CM | POA: Diagnosis present

## 2019-11-01 DIAGNOSIS — I421 Obstructive hypertrophic cardiomyopathy: Secondary | ICD-10-CM | POA: Diagnosis present

## 2019-11-01 DIAGNOSIS — J9611 Chronic respiratory failure with hypoxia: Secondary | ICD-10-CM | POA: Diagnosis present

## 2019-11-01 DIAGNOSIS — K228 Other specified diseases of esophagus: Secondary | ICD-10-CM | POA: Diagnosis present

## 2019-11-01 DIAGNOSIS — E119 Type 2 diabetes mellitus without complications: Secondary | ICD-10-CM

## 2019-11-01 DIAGNOSIS — R627 Adult failure to thrive: Secondary | ICD-10-CM | POA: Diagnosis present

## 2019-11-01 DIAGNOSIS — K760 Fatty (change of) liver, not elsewhere classified: Secondary | ICD-10-CM | POA: Diagnosis present

## 2019-11-01 DIAGNOSIS — K3184 Gastroparesis: Secondary | ICD-10-CM | POA: Diagnosis present

## 2019-11-01 DIAGNOSIS — J849 Interstitial pulmonary disease, unspecified: Secondary | ICD-10-CM | POA: Diagnosis present

## 2019-11-01 DIAGNOSIS — Z7951 Long term (current) use of inhaled steroids: Secondary | ICD-10-CM

## 2019-11-01 DIAGNOSIS — I959 Hypotension, unspecified: Secondary | ICD-10-CM | POA: Diagnosis not present

## 2019-11-01 DIAGNOSIS — B370 Candidal stomatitis: Secondary | ICD-10-CM | POA: Diagnosis not present

## 2019-11-01 DIAGNOSIS — M349 Systemic sclerosis, unspecified: Secondary | ICD-10-CM | POA: Diagnosis present

## 2019-11-01 DIAGNOSIS — J449 Chronic obstructive pulmonary disease, unspecified: Secondary | ICD-10-CM | POA: Diagnosis present

## 2019-11-01 DIAGNOSIS — T502X5A Adverse effect of carbonic-anhydrase inhibitors, benzothiadiazides and other diuretics, initial encounter: Secondary | ICD-10-CM | POA: Diagnosis not present

## 2019-11-01 DIAGNOSIS — Z7952 Long term (current) use of systemic steroids: Secondary | ICD-10-CM

## 2019-11-01 DIAGNOSIS — E11649 Type 2 diabetes mellitus with hypoglycemia without coma: Principal | ICD-10-CM | POA: Diagnosis present

## 2019-11-01 DIAGNOSIS — J45909 Unspecified asthma, uncomplicated: Secondary | ICD-10-CM | POA: Diagnosis present

## 2019-11-01 DIAGNOSIS — E43 Unspecified severe protein-calorie malnutrition: Secondary | ICD-10-CM | POA: Diagnosis present

## 2019-11-01 DIAGNOSIS — Z6823 Body mass index (BMI) 23.0-23.9, adult: Secondary | ICD-10-CM

## 2019-11-01 DIAGNOSIS — R9389 Abnormal findings on diagnostic imaging of other specified body structures: Secondary | ICD-10-CM | POA: Diagnosis present

## 2019-11-01 DIAGNOSIS — Z87891 Personal history of nicotine dependence: Secondary | ICD-10-CM

## 2019-11-01 DIAGNOSIS — D638 Anemia in other chronic diseases classified elsewhere: Secondary | ICD-10-CM | POA: Diagnosis present

## 2019-11-01 DIAGNOSIS — Z7982 Long term (current) use of aspirin: Secondary | ICD-10-CM

## 2019-11-01 DIAGNOSIS — R1314 Dysphagia, pharyngoesophageal phase: Secondary | ICD-10-CM | POA: Diagnosis present

## 2019-11-01 DIAGNOSIS — I472 Ventricular tachycardia: Secondary | ICD-10-CM | POA: Diagnosis not present

## 2019-11-01 DIAGNOSIS — Z79899 Other long term (current) drug therapy: Secondary | ICD-10-CM

## 2019-11-01 DIAGNOSIS — E876 Hypokalemia: Secondary | ICD-10-CM | POA: Diagnosis present

## 2019-11-01 DIAGNOSIS — Z9981 Dependence on supplemental oxygen: Secondary | ICD-10-CM

## 2019-11-01 DIAGNOSIS — N179 Acute kidney failure, unspecified: Secondary | ICD-10-CM | POA: Diagnosis present

## 2019-11-01 DIAGNOSIS — Z833 Family history of diabetes mellitus: Secondary | ICD-10-CM

## 2019-11-01 DIAGNOSIS — Z515 Encounter for palliative care: Secondary | ICD-10-CM | POA: Diagnosis not present

## 2019-11-01 DIAGNOSIS — F329 Major depressive disorder, single episode, unspecified: Secondary | ICD-10-CM | POA: Diagnosis present

## 2019-11-01 DIAGNOSIS — Z20822 Contact with and (suspected) exposure to covid-19: Secondary | ICD-10-CM | POA: Diagnosis present

## 2019-11-01 DIAGNOSIS — E041 Nontoxic single thyroid nodule: Secondary | ICD-10-CM | POA: Diagnosis present

## 2019-11-01 DIAGNOSIS — D72819 Decreased white blood cell count, unspecified: Secondary | ICD-10-CM | POA: Diagnosis not present

## 2019-11-01 DIAGNOSIS — E1143 Type 2 diabetes mellitus with diabetic autonomic (poly)neuropathy: Secondary | ICD-10-CM | POA: Diagnosis present

## 2019-11-01 DIAGNOSIS — K8020 Calculus of gallbladder without cholecystitis without obstruction: Secondary | ICD-10-CM | POA: Diagnosis present

## 2019-11-01 DIAGNOSIS — I5032 Chronic diastolic (congestive) heart failure: Secondary | ICD-10-CM | POA: Diagnosis present

## 2019-11-01 DIAGNOSIS — Z96653 Presence of artificial knee joint, bilateral: Secondary | ICD-10-CM | POA: Diagnosis present

## 2019-11-01 DIAGNOSIS — Z8249 Family history of ischemic heart disease and other diseases of the circulatory system: Secondary | ICD-10-CM

## 2019-11-01 DIAGNOSIS — Z7984 Long term (current) use of oral hypoglycemic drugs: Secondary | ICD-10-CM

## 2019-11-01 DIAGNOSIS — K567 Ileus, unspecified: Secondary | ICD-10-CM | POA: Diagnosis not present

## 2019-11-01 DIAGNOSIS — Z560 Unemployment, unspecified: Secondary | ICD-10-CM

## 2019-11-01 DIAGNOSIS — R54 Age-related physical debility: Secondary | ICD-10-CM | POA: Diagnosis present

## 2019-11-01 DIAGNOSIS — Z01810 Encounter for preprocedural cardiovascular examination: Secondary | ICD-10-CM

## 2019-11-01 DIAGNOSIS — I480 Paroxysmal atrial fibrillation: Secondary | ICD-10-CM | POA: Diagnosis present

## 2019-11-01 DIAGNOSIS — R634 Abnormal weight loss: Secondary | ICD-10-CM | POA: Diagnosis present

## 2019-11-01 DIAGNOSIS — K2100 Gastro-esophageal reflux disease with esophagitis, without bleeding: Secondary | ICD-10-CM | POA: Diagnosis present

## 2019-11-01 DIAGNOSIS — H409 Unspecified glaucoma: Secondary | ICD-10-CM | POA: Diagnosis present

## 2019-11-01 DIAGNOSIS — I10 Essential (primary) hypertension: Secondary | ICD-10-CM | POA: Diagnosis present

## 2019-11-01 DIAGNOSIS — G4733 Obstructive sleep apnea (adult) (pediatric): Secondary | ICD-10-CM | POA: Diagnosis present

## 2019-11-01 DIAGNOSIS — Z79890 Hormone replacement therapy (postmenopausal): Secondary | ICD-10-CM

## 2019-11-01 DIAGNOSIS — Z66 Do not resuscitate: Secondary | ICD-10-CM | POA: Diagnosis not present

## 2019-11-01 DIAGNOSIS — Z800 Family history of malignant neoplasm of digestive organs: Secondary | ICD-10-CM

## 2019-11-01 LAB — CBC WITH DIFFERENTIAL/PLATELET
Abs Immature Granulocytes: 0.01 10*3/uL (ref 0.00–0.07)
Basophils Absolute: 0 10*3/uL (ref 0.0–0.1)
Basophils Relative: 0 %
Eosinophils Absolute: 0 10*3/uL (ref 0.0–0.5)
Eosinophils Relative: 0 %
HCT: 30.9 % — ABNORMAL LOW (ref 36.0–46.0)
Hemoglobin: 10 g/dL — ABNORMAL LOW (ref 12.0–15.0)
Immature Granulocytes: 0 %
Lymphocytes Relative: 21 %
Lymphs Abs: 0.7 10*3/uL (ref 0.7–4.0)
MCH: 28.7 pg (ref 26.0–34.0)
MCHC: 32.4 g/dL (ref 30.0–36.0)
MCV: 88.5 fL (ref 80.0–100.0)
Monocytes Absolute: 0.2 10*3/uL (ref 0.1–1.0)
Monocytes Relative: 7 %
Neutro Abs: 2.5 10*3/uL (ref 1.7–7.7)
Neutrophils Relative %: 72 %
Platelets: 308 10*3/uL (ref 150–400)
RBC: 3.49 MIL/uL — ABNORMAL LOW (ref 3.87–5.11)
RDW: 17.2 % — ABNORMAL HIGH (ref 11.5–15.5)
WBC: 3.4 10*3/uL — ABNORMAL LOW (ref 4.0–10.5)
nRBC: 0 % (ref 0.0–0.2)

## 2019-11-01 LAB — SARS CORONAVIRUS 2 BY RT PCR (HOSPITAL ORDER, PERFORMED IN ~~LOC~~ HOSPITAL LAB): SARS Coronavirus 2: NEGATIVE

## 2019-11-01 LAB — COMPREHENSIVE METABOLIC PANEL WITH GFR
ALT: 11 U/L (ref 0–44)
AST: 19 U/L (ref 15–41)
Albumin: 1.8 g/dL — ABNORMAL LOW (ref 3.5–5.0)
Alkaline Phosphatase: 61 U/L (ref 38–126)
Anion gap: 10 (ref 5–15)
BUN: 30 mg/dL — ABNORMAL HIGH (ref 8–23)
CO2: 30 mmol/L (ref 22–32)
Calcium: 7.5 mg/dL — ABNORMAL LOW (ref 8.9–10.3)
Chloride: 96 mmol/L — ABNORMAL LOW (ref 98–111)
Creatinine, Ser: 1.32 mg/dL — ABNORMAL HIGH (ref 0.44–1.00)
GFR calc Af Amer: 47 mL/min — ABNORMAL LOW
GFR calc non Af Amer: 41 mL/min — ABNORMAL LOW
Glucose, Bld: 203 mg/dL — ABNORMAL HIGH (ref 70–99)
Potassium: 3.2 mmol/L — ABNORMAL LOW (ref 3.5–5.1)
Sodium: 136 mmol/L (ref 135–145)
Total Bilirubin: 0.6 mg/dL (ref 0.3–1.2)
Total Protein: 5.8 g/dL — ABNORMAL LOW (ref 6.5–8.1)

## 2019-11-01 LAB — POCT GLYCOSYLATED HEMOGLOBIN (HGB A1C): Hemoglobin A1C: 5 % (ref 4.0–5.6)

## 2019-11-01 LAB — LIPASE, BLOOD: Lipase: 22 U/L (ref 11–51)

## 2019-11-01 LAB — GLUCOSE, CAPILLARY: Glucose-Capillary: 79 mg/dL (ref 70–99)

## 2019-11-01 LAB — CBG MONITORING, ED
Glucose-Capillary: 20 mg/dL — CL (ref 70–99)
Glucose-Capillary: 84 mg/dL (ref 70–99)
Glucose-Capillary: 209 mg/dL — ABNORMAL HIGH (ref 70–99)
Glucose-Capillary: 126 mg/dL — ABNORMAL HIGH (ref 70–99)

## 2019-11-01 LAB — TSH: TSH: 10.589 u[IU]/mL — ABNORMAL HIGH (ref 0.350–4.500)

## 2019-11-01 MED ORDER — ONDANSETRON HCL 4 MG/2ML IJ SOLN
4.0000 mg | Freq: Four times a day (QID) | INTRAMUSCULAR | Status: DC | PRN
  Administered 2019-11-01 – 2019-11-17 (×11): 4 mg via INTRAVENOUS
  Filled 2019-11-01 (×11): qty 2

## 2019-11-01 MED ORDER — ONDANSETRON HCL 4 MG/2ML IJ SOLN
4.0000 mg | Freq: Once | INTRAMUSCULAR | Status: AC
  Administered 2019-11-01: 4 mg via INTRAVENOUS
  Filled 2019-11-01: qty 2

## 2019-11-01 MED ORDER — IPRATROPIUM-ALBUTEROL 0.5-2.5 (3) MG/3ML IN SOLN: 3.0000 mL | RESPIRATORY_TRACT | Status: DC | PRN

## 2019-11-01 MED ORDER — VERAPAMIL HCL 120 MG PO TABS
120.0000 mg | ORAL_TABLET | Freq: Every day | ORAL | Status: DC
  Administered 2019-11-01 – 2019-11-11 (×11): 120 mg via ORAL
  Filled 2019-11-01 (×11): qty 1

## 2019-11-01 MED ORDER — PANCRELIPASE (LIP-PROT-AMYL) 12000-38000 UNITS PO CPEP: 24000.0000 [IU] | ORAL_CAPSULE | Freq: Two times a day (BID) | ORAL | Status: DC | PRN

## 2019-11-01 MED ORDER — ACCU-CHEK GUIDE VI STRP
ORAL_STRIP | 2 refills | Status: AC
Start: 2019-11-01 — End: ?

## 2019-11-01 MED ORDER — DEXTROSE 50 % IV SOLN
INTRAVENOUS | Status: AC
  Administered 2019-11-01: 50 mL
  Filled 2019-11-01: qty 50

## 2019-11-01 MED ORDER — CARVEDILOL 12.5 MG PO TABS
25.0000 mg | ORAL_TABLET | Freq: Two times a day (BID) | ORAL | Status: DC
  Administered 2019-11-01 – 2019-11-18 (×33): 25 mg via ORAL
  Filled 2019-11-01 (×23): qty 2
  Filled 2019-11-01: qty 8
  Filled 2019-11-01 (×9): qty 2

## 2019-11-01 MED ORDER — LATANOPROST 0.005 % OP SOLN
1.0000 [drp] | Freq: Every day | OPHTHALMIC | Status: DC
  Administered 2019-11-01 – 2019-11-20 (×20): 1 [drp] via OPHTHALMIC
  Filled 2019-11-01 (×5): qty 2.5

## 2019-11-01 MED ORDER — LEVOTHYROXINE SODIUM 88 MCG PO TABS
88.0000 ug | ORAL_TABLET | Freq: Every day | ORAL | Status: DC
  Administered 2019-11-02 – 2019-11-18 (×16): 88 ug via ORAL
  Filled 2019-11-01 (×16): qty 1

## 2019-11-01 MED ORDER — ASPIRIN EC 81 MG PO TBEC
81.0000 mg | DELAYED_RELEASE_TABLET | Freq: Every day | ORAL | Status: DC
  Administered 2019-11-02 – 2019-11-18 (×18): 81 mg via ORAL
  Filled 2019-11-01 (×17): qty 1

## 2019-11-01 MED ORDER — HEPARIN SODIUM (PORCINE) 5000 UNIT/ML IJ SOLN
5000.0000 [IU] | Freq: Three times a day (TID) | INTRAMUSCULAR | Status: DC
  Administered 2019-11-01 – 2019-11-18 (×49): 5000 [IU] via SUBCUTANEOUS
  Filled 2019-11-01 (×48): qty 1

## 2019-11-01 MED ORDER — MOMETASONE FURO-FORMOTEROL FUM 200-5 MCG/ACT IN AERO
2.0000 | INHALATION_SPRAY | Freq: Two times a day (BID) | RESPIRATORY_TRACT | Status: DC
  Administered 2019-11-01 – 2019-11-18 (×35): 2 via RESPIRATORY_TRACT

## 2019-11-01 MED ORDER — ACETAMINOPHEN 650 MG RE SUPP: 650.0000 mg | Freq: Four times a day (QID) | RECTAL | Status: DC | PRN

## 2019-11-01 MED ORDER — KCL IN DEXTROSE-NACL 40-5-0.9 MEQ/L-%-% IV SOLN: INTRAVENOUS | Status: AC

## 2019-11-01 MED ORDER — MOMETASONE FURO-FORMOTEROL FUM 200-5 MCG/ACT IN AERO
INHALATION_SPRAY | RESPIRATORY_TRACT | Status: AC
  Filled 2019-11-01: qty 8.8

## 2019-11-01 MED ORDER — PANTOPRAZOLE SODIUM 40 MG PO TBEC
40.0000 mg | DELAYED_RELEASE_TABLET | Freq: Every day | ORAL | Status: DC
  Administered 2019-11-01 – 2019-11-02 (×2): 40 mg via ORAL
  Filled 2019-11-01 (×2): qty 1

## 2019-11-01 MED ORDER — ACETAMINOPHEN 325 MG PO TABS
650.0000 mg | ORAL_TABLET | Freq: Four times a day (QID) | ORAL | Status: DC | PRN
  Administered 2019-11-09 – 2019-11-19 (×6): 650 mg via ORAL
  Filled 2019-11-01 (×6): qty 2

## 2019-11-01 MED ORDER — DEXTROSE 5 % IV SOLN: INTRAVENOUS | Status: DC

## 2019-11-01 MED ORDER — PANCRELIPASE (LIP-PROT-AMYL) 25000-79000 UNITS PO CPEP
2.0000 | ORAL_CAPSULE | Freq: Two times a day (BID) | ORAL | Status: DC
  Filled 2019-11-01 (×5): qty 2

## 2019-11-01 MED ORDER — PREDNISONE 10 MG PO TABS
5.0000 mg | ORAL_TABLET | Freq: Every day | ORAL | Status: DC
  Administered 2019-11-01 – 2019-11-16 (×16): 5 mg via ORAL
  Filled 2019-11-01 (×16): qty 1

## 2019-11-01 MED ORDER — ONDANSETRON HCL 4 MG PO TABS: 4.0000 mg | ORAL_TABLET | Freq: Four times a day (QID) | ORAL | Status: DC | PRN

## 2019-11-01 MED ORDER — SODIUM CHLORIDE 0.9 % IV SOLN: Freq: Once | INTRAVENOUS | Status: AC

## 2019-11-01 MED ORDER — POLYETHYLENE GLYCOL 3350 17 G PO PACK: 17.0000 g | PACK | Freq: Every day | ORAL | Status: DC | PRN

## 2019-11-01 NOTE — Progress Notes (Signed)
Patient c/o the oxygen burning her nose. RT called to place patient on humidified O2.

## 2019-11-01 NOTE — ED Notes (Signed)
Rounded on pt. Dr. Denton Brick in room. Pt sats 84% on RA. Pt on 3LNC at home. [place] placed on pt. Pt sats immediately rose to 98%.

## 2019-11-01 NOTE — H&P (Addendum)
History and Physical    [name] IWL:[id number] DOB: 11/25/1950 DOA: 11/01/2019  PCP: Maryruth Hancock, MD   Patient coming from: Home  I have personally briefly reviewed patient's old medical records in [place]  Chief Complaint: Low blood sugar  HPI: [name] is a 69 y.o. female with medical history significant for COPD asthma with chronic respiratory failure on 3 L, paroxysmal atrial fibrillation, HOCM,liver cirrhosis, diabetes mellitus. Patient was sent to the ED via EMS from her endocrinologist's office with blood sugar of 32, she was given D50 and blood sugar increased to 47, but on arrival to the ED, her blood sugar was 20. Over the past 3 months she has been vomiting every day, at least 2-3 times a day, she has been vomiting almost every thing she tries to eat, with resulted in weight loss.  She reports chronic intermittent abdominal pain.  But she has no abdominal pain today.  No loose stools.  Patient is not on any medications for her diabetes.  She has chronic low white extremity swelling that that fluctuates but is currently unchanged, no chest pain, no difficulty breathing.  ED Course: Stable vitals.  HgbA1c - 5.  Mild creatinine elevation 1.3.  Potassium 3.2.  Normal lipase 22.  TSH elevated at 10.589.  Patient was placed on D5 normal saline infusion.  Hospitalist to admit for hypoglycemia and mild acute kidney injury.  Review of Systems: As per HPI all other systems reviewed and negative.  Past Medical History:  Diagnosis Date  . Anterolisthesis    Grade 1, L4-5  . Bronchospasm 05/28/2012  . COPD (chronic obstructive pulmonary disease) ([place])   . DEGENERATIVE JOINT DISEASE 10/06/2006  . DEPRESSION 09/26/2008  . DIABETES MELLITUS 10/06/2006  . Diastolic heart failure ([place])    Echo 10/2018 LVEF 55-60% with impaired relaxation.  . Diverticulosis    [id number]  . GERD (gastroesophageal reflux disease) 07/25/2013  . Glaucoma   . HOCM (hypertrophic  obstructive cardiomyopathy) ([place])   . HYPERLIPIDEMIA 01/11/2009  . HYPERTENSION 10/06/2006  . Hypokalemia   . Hypomagnesemia   . Hypothyroidism   . INSOMNIA 09/26/2008  . Internal hemorrhoids   . OBSTRUCTIVE SLEEP APNEA 06/23/2008   Severe OSA per sleep study 2010, Rx a CPAP  . Pain in joint, multiple sites 11/10/2006  . Pericardial effusion   . Pericarditis   . UNSPECIFIED ANEMIA 12/10/2009  . UTI (urinary tract infection) 11/2017    Past Surgical History:  Procedure Laterality Date  . BIOPSY  12/28/2017   Procedure: BIOPSY;  Surgeon: Rush Landmark Telford Nab., MD;  Location: [facility];  Service: Gastroenterology;;  . BIOPSY  05/19/2019   Procedure: BIOPSY;  Surgeon: Daneil Dolin, MD;  [facility];  Service: Endoscopy;;  gastric  . COLONOSCOPY  08/01/2011   Procedure: COLONOSCOPY;  Surgeon: Inda Castle, MD;  Location: [facility];  Service: Endoscopy;  Laterality: N/A;. hyperplastic polyp removed, hemorrhoids s/p banding, diverticulosis, next tcs 10 years.   . ESOPHAGOGASTRODUODENOSCOPY (EGD) WITH PROPOFOL N/A 12/28/2017   Procedure: ESOPHAGOGASTRODUODENOSCOPY (EGD) WITH PROPOFOL;  Surgeon: Rush Landmark Telford Nab., MD;  Location: [facility];  Service: Gastroenterology;  Laterality: N/A; h.pylori gastiris, tortuous esophagus with esophageal bx (no EOE), medium sized hiatal hernia  . ESOPHAGOGASTRODUODENOSCOPY (EGD) WITH PROPOFOL N/A 05/19/2019   Procedure: ESOPHAGOGASTRODUODENOSCOPY (EGD) WITH PROPOFOL; severe reflux esophagitis, medium size hiatal hernia, gastric erosions with biopsies positive for mild chronic gastritis and negative for H. pylori.  No dilation performed.  Marland Kitchen LEFT  HEART CATH AND CORONARY ANGIOGRAPHY N/A 10/22/2017   Procedure: LEFT HEART CATH AND CORONARY ANGIOGRAPHY;  Surgeon: Jettie Booze, MD;  Location: [facility];  Service: Cardiovascular;  Laterality: N/A;  . 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 Right knee replacement  2005     reports that  she quit smoking about 24 years ago. Her smoking use included cigarettes. She has a 0.80 pack-year smoking history. She has never used smokeless tobacco. She reports previous alcohol use. She reports that she does not use drugs.  No Known Allergies  Family History  Problem Relation Age of Onset  . Asthma Mother   . Stroke Mother   . Diabetes Mother   . Diabetes Other        M, B, S  . Hypertension Sister        M, S,B  . Pancreatic cancer Brother   . Colon cancer Neg Hx   . Prostate cancer Neg Hx   . Breast cancer Neg Hx   . Esophageal cancer Neg Hx   . Liver disease Neg Hx   . Rectal cancer Neg Hx   . Stomach cancer Neg Hx   . Inflammatory bowel disease Neg Hx     Prior to Admission medications   Medication Sig Start Date End Date Taking? Authorizing Provider  albuterol (VENTOLIN HFA) 108 (90 Base) MCG/ACT inhaler Inhale 2 puffs into the lungs every 4 (four) hours as needed for wheezing or shortness of breath. 10/28/18  Yes Paz, Alda Berthold, MD  alum & mag hydroxide-simeth (MAALOX/MYLANTA) 200-200-20 MG/5ML suspension Take 30 mLs by mouth every 4 (four) hours as needed for indigestion. 12/11/18  Yes Manuella Ghazi, Pratik D, DO  aspirin EC 81 MG tablet Take 1 tablet (81 mg total) by mouth daily. 08/13/18  Yes Paz, Alda Berthold, MD  carvedilol (COREG) 25 MG tablet Take 1 tablet (25 mg total) by mouth 2 (two) times daily. Patient taking differently: Take 25 mg by mouth in the morning and at bedtime.  12/24/18 11/01/19 Yes Dunn, Nedra Hai, PA-C  Cholecalciferol 50 MCG (2000 UT) CAPS Take 1 capsule (2,000 Units total) by mouth daily with breakfast. 05/24/19  Yes Nida, Marella Chimes, MD  colchicine 0.6 MG tablet Take 0.6 mg by mouth daily.   Yes [provider]  dexlansoprazole (DEXILANT) 60 MG capsule Take 1 capsule (60 mg total) by mouth daily. 10/17/19  Yes Harper, Kristen S, PA-C  Fluticasone-Salmeterol (WIXELA INHUB) 250-50 MCG/DOSE AEPB Inhale 1 puff into the lungs 2 (two) times daily. 10/28/18  Yes  [provider]  furosemide (LASIX) 20 MG tablet Take 1 tablet (20 mg total) by mouth daily. 06/28/19  Yes Imogene Burn, PA-C  latanoprost (XALATAN) 0.005 % ophthalmic solution Place 1 drop into both eyes at bedtime.   Yes [provider]  levothyroxine (SYNTHROID) 88 MCG tablet Take 1 tablet (88 mcg total) by mouth daily before breakfast. 04/28/19  Yes Nida, Marella Chimes, MD  metoCLOPramide (REGLAN) 5 MG tablet Take 5 mg by mouth 3 (three) times daily as needed for nausea or vomiting.  10/29/19  Yes [provider]  OXYGEN Inhale 2 L into the lungs daily as needed (shortness of breath).    Yes [provider]  Pancrelipase, Lip-Prot-Amyl, (ZENPEP) 25000-79000 units CPEP Take 2 capsules (50,000 units) with breakfast lunch and dinner.  Take 1 capsule (25,000 units) with snacks up to twice daily. Patient taking differently: Take 1-2 capsules by mouth See admin instructions. Take 2  capsules (50,000 units) with breakfast lunch and dinner.  Take 1 capsule (25,000 units) with snacks up to twice daily. 10/17/19  Yes Aliene Altes S, PA-C  potassium chloride (KLOR-CON 10) 10 MEQ tablet Take 10 mEq by mouth daily.   Yes [provider]  predniSONE (DELTASONE) 5 MG tablet Take 5 mg by mouth daily. 07/04/19  Yes [provider]  rosuvastatin (CRESTOR) 40 MG tablet TAKE 1 TABLET BY MOUTH IN  THE EVENING Patient taking differently: Take 40 mg by mouth at bedtime.  03/11/19  Yes Corum, Rex Kras, MD  verapamil (CALAN) 120 MG tablet Take 120 mg by mouth daily. 10/12/19  Yes [provider]  escitalopram (LEXAPRO) 20 MG tablet TAKE 1 TABLET BY MOUTH  DAILY Patient not taking: Reported on 11/01/2019 03/11/19   Maryruth Hancock, MD  glucose blood (ACCU-CHEK GUIDE) test strip Use as instructed Patient not taking: Reported on 11/01/2019 11/01/19   Cassandria Anger, MD  HYDROcodone-acetaminophen (NORCO/VICODIN) 5-325 MG tablet Take 1 tablet by mouth every 6  (six) hours as needed. Patient not taking: Reported on 11/01/2019 10/02/19   Milton Ferguson, MD  ondansetron (ZOFRAN ODT) 4 MG disintegrating tablet 4mg  ODT q4 hours prn nausea/vomit Patient not taking: Reported on 11/01/2019 10/02/19   Milton Ferguson, MD  ondansetron (ZOFRAN) 4 MG tablet Take 1 tablet (4 mg total) by mouth every 8 (eight) hours as needed for nausea or vomiting. Patient not taking: Reported on 11/01/2019 08/11/19   Erenest Rasher, PA-C  potassium chloride SA (KLOR-CON) 20 MEQ tablet Take 1 tablet (20 mEq total) by mouth 2 (two) times daily. Patient not taking: Reported on 11/01/2019 08/16/19   Maryruth Hancock, MD  verapamil (CALAN SR) 120 MG CR tablet Take 1 tablet (120 mg total) by mouth daily. Patient not taking: Reported on 11/01/2019 09/08/19   Evans Lance, MD    Physical Exam: Vitals:   11/01/19 1448 11/01/19 1451  BP:  (!) 132/92  Pulse:  68  Resp:  16  Temp:  (!) 97.4 F (36.3 C)  TempSrc:  Oral  SpO2:  91%  Weight: 68.9 kg   Height: 5\' 8"  (1.727 m)     Constitutional: Chronically ill-appearing, NAD, calm, comfortable Vitals:   11/01/19 1448 11/01/19 1451  BP:  (!) 132/92  Pulse:  68  Resp:  16  Temp:  (!) 97.4 F (36.3 C)  TempSrc:  Oral  SpO2:  91%  Weight: 68.9 kg   Height: 5\' 8"  (1.727 m)    Eyes: Lids and conjunctivae normal ENMT: Mucous membranes are moist.  Neck: normal, supple, no masses, no thyromegaly Respiratory: clear to auscultation bilaterally, no wheezing, no crackles. Normal respiratory effort. No accessory muscle use.  Cardiovascular: Regular rate and rhythm, no murmurs / rubs / gallops.  2+ extremity edema to mid leg. 2+ pedal pulses.  Abdomen: no tenderness, no masses palpated. No hepatosplenomegaly. Bowel sounds positive.  Musculoskeletal: no clubbing / cyanosis. No joint deformity upper and lower extremities. Good ROM, no contractures. Normal muscle tone.  Skin: no rashes, lesions, ulcers. No induration Neurologic: No apparent  cranial nerve abnormality, moving all extremities spontaneously. Psychiatric: Normal judgment and insight. Alert and oriented x 3. Normal mood.   Labs on Admission: I have personally reviewed following labs and imaging studies  CBC: Recent Labs  Lab 11/01/19 1525  WBC 3.4*  NEUTROABS 2.5  HGB 10.0*  HCT 30.9*  MCV 88.5  PLT 937   Basic Metabolic Panel: Recent Labs  Lab 11/01/19 1525  NA 136  K 3.2*  CL 96*  CO2 30  GLUCOSE 203*  BUN 30*  CREATININE 1.32*  CALCIUM 7.5*   Liver Function Tests: Recent Labs  Lab 11/01/19 1525  AST 19  ALT 11  ALKPHOS 61  BILITOT 0.6  PROT 5.8*  ALBUMIN 1.8*   Recent Labs  Lab 11/01/19 1525  LIPASE 22   HbA1C: Recent Labs    11/01/19 1322  HGBA1C 5.0   CBG: Recent Labs  Lab 11/01/19 1452 11/01/19 1554 11/01/19 1720 11/01/19 1836  GLUCAP 20* 84 209* 126*   Thyroid Function Tests: Recent Labs    11/01/19 1525  TSH 10.589*   Urine analysis:    Component Value Date/Time   COLORURINE YELLOW 08/17/2019 [place] 08/17/2019 1134   LABSPEC 1.006 08/17/2019 1134   PHURINE 7.0 08/17/2019 [place] 08/17/2019 1134   GLUCOSEU NEGATIVE 11/30/2013 1057   HGBUR NEGATIVE 08/17/2019 1134   [place] 12/09/2018 0923   KETONESUR NEGATIVE 08/17/2019 1134   PROTEINUR NEGATIVE 08/17/2019 1134   UROBILINOGEN 0.2 11/30/2013 1057   NITRITE NEGATIVE 08/17/2019 1134   LEUKOCYTESUR NEGATIVE 08/17/2019 1134    Radiological Exams on Admission: No results found.  EKG: Independently reviewed.  Sinus rhythm, QTC 471, T wave changes in aVL and V2 only otherwise no significant changes compared to prior EKG.  Assessment/Plan Principal Problem:   Hypoglycemia Active Problems:   Type 2 diabetes mellitus without complication, without long-term current use of insulin (HCC)   Essential hypertension   Asthma, chronic   Chronic respiratory failure with hypoxia (HCC)   ILD (interstitial lung  disease) (HCC)   Hypothyroidism   Hepatic cirrhosis (HCC)   Paroxysmal atrial fibrillation (HCC)   Loss of weight    Hypoglycemia -glucose down to 20 in the ED. Not on any antihyperglycemic agents or medications for diabetes.  Hypoglycemia likely secondary to chronic vomiting of recently ingested food, with subsequent weight loss.  Blood glucose in the 200s currently. -Continue D5 containing fluids -Monitor glucose closely every 3x3 - Follow-up random insulin, pro-insulin/insulin ratio, C-peptide ordered in ED  Intractable chronic vomiting -  With resultant hypoglycemia and acute kidney injury, Patient has been following with gastroenterologist for same vomiting and other GI issues.  Per notes- EGD January 2021 showed severe reflux esophagitis, medium-sized hiatal hernia, gastric erosions with mild chronic gastritis on pathology.  It was felt that patient's vomiting was likely multifactorial.  Patient also had severe esophageal dysmotility on BPE in October 2020.  She also has a fairly new diagnosis of scleroderma.  There is also consideration of gallbladder stones contributing to her vomiting, but Dr. Constance Haw did not feel this was patient's primary issue, and that the risk outweighed the benefits of a cholecystectomy. -Abd Ct 09/19/2019-several scattered cystic structures within the head and body of the pancreas.  F/u Abdominal MRI recommended in 24 months -Continue D5 N/s 75cc/hr + 40 kcl x 1 day -Will consult GI for persistent vomiting, may need general surgery re-evaluation also. -Resume home PPI -Clear liquid diet, advance as tolerated -Zofran as needed  Acute kidney injury-creatinine 1.32, baseline 0.7-1.  Likely prerenal from vomiting. -Gentle IV fluids normal saline + 40 KCL 75cc/hr x 1 day  Controlled diabetes mellitus-currently hypoglycemic, not on medications.  A1c today 5. -Monitor glucose closely.  Hypertension-stable. -Resume home carvedilol, verapamil  Paroxysmal atrial  fibrillation-listed in problem list, but no prior documentation of this.  Currently in sinus rhythm.  Not on anticoagulation, rate controlled on carvedilol. - resume carvedilol  COPD, asthma, interstitial lung disease, chronic respiratory failure- on 3 L.  Follows with pulmonologist Dr. Elsworth Soho. -DuoNebs as needed -Supplemental oxygen -Resume 5 mg daily dose of prednisone  Liver cirrhosis, diastolic CHF- stable and compensated, 2+ pitting extremity to mid leg which is chronic and unchanged.  Last echo 09/2019 EF 55 to 16%, grade 1 diastolic dysfunction. -With acute kidney injury, hold off on home Lasix 20 mg daily for now  -Gentle hydration.  Elevated TSH-10.58. -Resume home Synthroid -Follow-up with primary care provider    DVT prophylaxis: Heparin Code Status: Full code Family Communication: None at bedside Disposition Plan:  ~ 1 - 2 days, pending improvement in hypoglycemia off dextrose- containing fluids, and GI evaluation. Consults called: Gastroenterology Admission status: Observation, telemetry   Bethena Roys MD [facility]  11/01/2019, 9:30 PM

## 2019-11-01 NOTE — ED Triage Notes (Signed)
Pt brought to ED via [facility] sent from PCP for CBG of 32, was given 1 amp of D50 and CBG increased to 47. Pt alert and oriented x 4.

## 2019-11-01 NOTE — ED Provider Notes (Addendum)
Emergency Department Provider Note   I have reviewed the triage vital signs and the nursing notes.   HISTORY  Chief Complaint Hypoglycemia   HPI [name] is a 69 y.o. female with past history reviewed below including severe gastritis, chronic abdominal pain, non-insulin-dependent diabetes, and recent CT showing scattered cystic structures within the head and body of the pancreas (09/19/19) presents to the emergency department with hypoglycemia.  Patient states that she has been having vomiting chronically including today.  She was at her endocrinologist office and began having vomiting there.  They checked her blood sugar and found it to be 32.  She was given D50 with insulin increasing to 47 and then repeated here and found to be 20.  Patient states she has been feeling relatively well with only her chronic symptoms being an issue.  She has not had fevers or chills.  She has not been on insulin for several years.  She has been following with her primary care doctor as well as gastroenterology with EGD earlier this year along with CT scan in May showing multiple pancreatic cystic structures.  She has abdominal MRI scheduled. She is followed by [facility] for this.  She denies any ongoing pain but is having some nausea.  She is not experiencing chest pain or shortness of breath.    Past Medical History:  Diagnosis Date  . Anterolisthesis    Grade 1, L4-5  . Bronchospasm 05/28/2012  . COPD (chronic obstructive pulmonary disease) ([place])   . DEGENERATIVE JOINT DISEASE 10/06/2006  . DEPRESSION 09/26/2008  . DIABETES MELLITUS 10/06/2006  . Diastolic heart failure ([place])    Echo 10/2018 LVEF 55-60% with impaired relaxation.  . Diverticulosis    [id number]  . GERD (gastroesophageal reflux disease) 07/25/2013  . Glaucoma   . HOCM (hypertrophic obstructive cardiomyopathy) ([place])   . HYPERLIPIDEMIA 01/11/2009  . HYPERTENSION 10/06/2006  . Hypokalemia   . Hypomagnesemia   . Hypothyroidism    . INSOMNIA 09/26/2008  . Internal hemorrhoids   . OBSTRUCTIVE SLEEP APNEA 06/23/2008   Severe OSA per sleep study 2010, Rx a CPAP  . Pain in joint, multiple sites 11/10/2006  . Pericardial effusion   . Pericarditis   . UNSPECIFIED ANEMIA 12/10/2009  . UTI (urinary tract infection) 11/2017    Patient Active Problem List   Diagnosis Date Noted  . Pancreatic cyst 10/17/2019  . Esophageal dysmotility 09/29/2019  . Rib pain on right side 08/18/2019  . Diarrhea 08/12/2019  . Loss of weight 04/28/2019  . Paroxysmal atrial fibrillation ([place]) 03/01/2019  . Multinodular goiter 01/26/2019  . Regurgitation of food 01/21/2019  . Calculus of gallbladder without cholecystitis without obstruction 01/21/2019  . Numbness and tingling in both hands 12/25/2018  . Chronic cholecystitis with calculus 12/16/2018  . Abnormal LFTs 12/16/2018  . Abnormal weight loss 12/16/2018  . Upper abdominal pain   . Elevated transaminase level   . Hepatic cirrhosis ([place])   . Nausea and vomiting in adult   . Acute cholecystitis 12/09/2018  . Pyrosis 03/12/2018  . History of Helicobacter pylori infection 02/01/2018  . Dysphagia 02/01/2018  . Anxiety and depression   . Hypothyroidism 12/22/2017  . ILD (interstitial lung disease) ([place]) 11/25/2017  . Chronic respiratory failure with hypoxia ([place]) 11/12/2017  . Pericardial effusion   . Paresthesia 10/19/2017  . Neck pain 10/19/2017  . PCP NOTES >>> 02/23/2015  . Nocturnal oxygen desaturation 01/02/2015  . Asthma, chronic 01/02/2015  . Gastroesophageal reflux disease 07/25/2013  .  DOE (dyspnea on exertion) 04/25/2013  . Internal hemorrhoids without mention of complication 28/36/6294  . Medicare annual wellness visit, subsequent 06/06/2011  . Anemia 12/10/2009  . SKIN LESION 08/06/2009  . Hyperlipidemia 01/11/2009  . Depression 09/26/2008  . INSOMNIA 09/26/2008  . OBSTRUCTIVE SLEEP APNEA 06/23/2008  . Chest pain 11/18/2007  . Type 2 diabetes mellitus without  complication, without Joanna Hall-term current use of insulin ([place]) 10/06/2006  . Essential hypertension 10/06/2006  . Osteoarthritis 10/06/2006    Past Surgical History:  Procedure Laterality Date  . BIOPSY  12/28/2017   Procedure: BIOPSY;  Surgeon: Rush Landmark Telford Nab., MD;  Location: [place];  Service: Gastroenterology;;  . BIOPSY  05/19/2019   Procedure: BIOPSY;  Surgeon: Daneil Dolin, MD;  [facility];  Service: Endoscopy;;  gastric  . COLONOSCOPY  08/01/2011   Procedure: COLONOSCOPY;  Surgeon: Inda Castle, MD;  Location: [facility];  Service: Endoscopy;  Laterality: N/A;. hyperplastic polyp removed, hemorrhoids s/p banding, diverticulosis, next tcs 10 years.   . ESOPHAGOGASTRODUODENOSCOPY (EGD) WITH PROPOFOL N/A 12/28/2017   Procedure: ESOPHAGOGASTRODUODENOSCOPY (EGD) WITH PROPOFOL;  Surgeon: Rush Landmark Telford Nab., MD;  Location: [place];  Service: Gastroenterology;  Laterality: N/A; h.pylori gastiris, tortuous esophagus with esophageal bx (no EOE), medium sized hiatal hernia  . ESOPHAGOGASTRODUODENOSCOPY (EGD) WITH PROPOFOL N/A 05/19/2019   Procedure: ESOPHAGOGASTRODUODENOSCOPY (EGD) WITH PROPOFOL; severe reflux esophagitis, medium size hiatal hernia, gastric erosions with biopsies positive for mild chronic gastritis and negative for H. pylori.  No dilation performed.  Marland Kitchen LEFT HEART CATH AND CORONARY ANGIOGRAPHY N/A 10/22/2017   Procedure: LEFT HEART CATH AND CORONARY ANGIOGRAPHY;  Surgeon: Jettie Booze, MD;  Location: [place] [facility];  Service: Cardiovascular;  Laterality: N/A;  . 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 Right knee replacement  2005    Allergies Patient has no known allergies.  Family History  Problem Relation Age of Onset  . Asthma Mother   . Stroke Mother   . Diabetes Mother   . Diabetes Other        M, B, S  . Hypertension Sister        M, S,B  . Pancreatic cancer Brother   . Colon cancer Neg Hx   . Prostate cancer Neg Hx   .  Breast cancer Neg Hx   . Esophageal cancer Neg Hx   . Liver disease Neg Hx   . Rectal cancer Neg Hx   . Stomach cancer Neg Hx   . Inflammatory bowel disease Neg Hx     Social History Social History   Tobacco Use  . Smoking status: Former Smoker    Packs/day: 0.20    Years: 4.00    Pack years: 0.80    Types: Cigarettes    Quit date: 05/13/1995    Years since quitting: 24.4  . Smokeless tobacco: Never Used  Vaping Use  . Vaping Use: Never used  Substance Use Topics  . Alcohol use: Not Currently  . Drug use: No    Review of Systems  Constitutional: No fever/chills. Low CBG with EMS and PCP.  Eyes: No visual changes. ENT: No sore throat. Cardiovascular: Denies chest pain. Respiratory: Denies shortness of breath. Gastrointestinal: No abdominal pain. Positive nausea and vomiting.  No diarrhea.  No constipation. Genitourinary: Negative for dysuria. Musculoskeletal: Negative for back pain. Skin: Negative for rash. Neurological: Negative for headaches, focal weakness or numbness.  10-point ROS otherwise negative.  ____________________________________________   PHYSICAL EXAM:  VITAL SIGNS: ED Triage Vitals  Enc Vitals Group     BP 11/01/19 1451 (!) 132/92     Pulse Rate 11/01/19 1451 68     Resp 11/01/19 1451 16     Temp 11/01/19 1451 (!) 97.4 F (36.3 C)     Temp Source 11/01/19 1451 Oral     SpO2 11/01/19 1451 91 %     Weight 11/01/19 1448 152 lb (68.9 kg)     Height 11/01/19 1448 5\' 8"  (1.727 m)   Constitutional: Alert and oriented. Well appearing and in no acute distress. Eyes: Conjunctivae are normal.  Head: Atraumatic. Nose: No congestion/rhinnorhea. Mouth/Throat: Mucous membranes are moist.  Oropharynx non-erythematous. Neck: No stridor.   Cardiovascular: Normal rate, regular rhythm. Good peripheral circulation. Grossly normal heart sounds.   Respiratory: Normal respiratory effort.  No retractions. Lungs CTAB. Gastrointestinal: Soft with mild right sided  tenderness. Negative Murphy's sign. No rebound or guarding. No distention.  Musculoskeletal: No gross deformities of extremities. Neurologic:  Normal speech and language. Skin:  Skin is warm, dry and intact. No rash noted.  ____________________________________________   LABS (all labs ordered are listed, but only abnormal results are displayed)  Labs Reviewed  COMPREHENSIVE METABOLIC PANEL - Abnormal; Notable for the following components:      Result Value   Potassium 3.2 (*)    Chloride 96 (*)    Glucose, Bld 203 (*)    BUN 30 (*)    Creatinine, Ser 1.32 (*)    Calcium 7.5 (*)    Total Protein 5.8 (*)    Albumin 1.8 (*)    GFR calc non Af Amer 41 (*)    GFR calc Af Amer 47 (*)    All other components within normal limits  CBC WITH DIFFERENTIAL/PLATELET - Abnormal; Notable for the following components:   WBC 3.4 (*)    RBC 3.49 (*)    Hemoglobin 10.0 (*)    HCT 30.9 (*)    RDW 17.2 (*)    All other components within normal limits  TSH - Abnormal; Notable for the following components:   TSH 10.589 (*)    All other components within normal limits  CBG MONITORING, ED - Abnormal; Notable for the following components:   Glucose-Capillary 20 (*)    All other components within normal limits  CBG MONITORING, ED - Abnormal; Notable for the following components:   Glucose-Capillary 209 (*)    All other components within normal limits  SARS CORONAVIRUS 2 BY RT PCR (HOSPITAL ORDER, [place] [facility])  LIPASE, BLOOD  URINALYSIS, ROUTINE W REFLEX MICROSCOPIC  CBG MONITORING, ED  CBG MONITORING, ED   ____________________________________________  EKG   EKG Interpretation  Date/Time:  Tuesday November 01 2019 18:20:15 EDT Ventricular Rate:  73 PR Interval:    QRS Duration: 113 QT Interval:  427 QTC Calculation: 471 R Axis:   66 Text Interpretation: Sinus rhythm Atrial premature complex Borderline prolonged PR interval Anteroseptal infarct, age indeterminate  No STEMI Confirmed by Nanda Quinton [phone]) on 11/01/2019 6:21:51 PM       ____________________________________________  RADIOLOGY  None  ____________________________________________   PROCEDURES  Procedure(s) performed:   Procedures  CRITICAL CARE Performed by: Margette Fast Total critical care time: 35 minutes Critical care time was exclusive of separately billable procedures and treating other patients. Critical care was necessary to treat or prevent imminent or life-threatening deterioration. Critical care was time spent personally by me on the following activities: development of treatment plan with patient and/or surrogate  as well as nursing, discussions with consultants, evaluation of patient's response to treatment, examination of patient, obtaining history from patient or surrogate, ordering and performing treatments and interventions, ordering and review of laboratory studies, ordering and review of radiographic studies, pulse oximetry and re-evaluation of patient's condition.  Nanda Quinton, MD Emergency Medicine  ____________________________________________   INITIAL IMPRESSION / ASSESSMENT AND PLAN / ED COURSE  Pertinent labs & imaging results that were available during my care of the patient were reviewed by me and considered in my medical decision making (see chart for details).   Patient presents to the emergency department for evaluation of hypoglycemia.  She has had nausea and vomiting today as well but has had these issues in the past along with chronic abdominal pain.  CT reviewed from May showing multiple cystic structures in the pancreas.  The patient is not on insulin at home.  In review of the patient's medication list at time of arrival I do not see any hypoglycemic inducing agents but will have pharmacy review her medicines.  With CT finding and may question a possible endocrine related tumor. Will give apple juice and peanut butter crackers and start  dextrose infusion.   03:45 PM  Reassessed patient. She is awake and alert. D5 running and she was able to eat a peanut butter cracker and drink some juice. Will continue to follow. No vomiting. Normal BP.   05:20 PM  Patient's repeat blood sugar now greater than 200.  Unclear if this is poor PO intake with dehydration and hypoglycemia or related to pancreatic changes on CT from May. Have transitioned over to NS and will obs admit for serial CBG and IVF overnight to ensure no more hypoglycemic episodes.   Discussed patient's case with [facility], Dr. Denton Brick to request admission. Patient and family (if present) updated with plan. Care transferred to [facility] service.  I reviewed all nursing notes, vitals, pertinent old records, EKGs, labs, imaging (as available).  EKG interpreted by me as above.  ____________________________________________  FINAL CLINICAL IMPRESSION(S) / ED DIAGNOSES  Final diagnoses:  Hypoglycemia    MEDICATIONS GIVEN DURING THIS VISIT:  Medications  dextrose 5 % solution ( Intravenous Stopped 11/01/19 1725)  dextrose 50 % solution (50 mLs  Given 11/01/19 1455)  ondansetron (ZOFRAN) injection 4 mg (4 mg Intravenous Given 11/01/19 1516)  0.9 %  sodium chloride infusion ( Intravenous New Bag/Given 11/01/19 1728)    Note:  This document was prepared using Dragon voice recognition software and may include unintentional dictation errors.  Nanda Quinton, MD, [facility]    Tramayne Sebesta, [facility], MD 11/01/19 1744    Margette Fast, MD 11/01/19 Vernelle Emerald

## 2019-11-01 NOTE — Progress Notes (Signed)
11/01/2019, 4:39 PM   Endocrinology follow-up note  Subjective:    Patient ID: [name], female    DOB: March 01, 1951.  [name] is being seen in follow-up after she was seen recently in consultation for management of longstanding diabetes, nodular goiter, hypothyroidism.    PMD:   Maryruth Hancock, MD.   Past Medical History:  Diagnosis Date   Anterolisthesis    Grade 1, L4-5   Bronchospasm 05/28/2012   COPD (chronic obstructive pulmonary disease) ([place])    DEGENERATIVE JOINT DISEASE 10/06/2006   DEPRESSION 09/26/2008   DIABETES MELLITUS 5/32/9924   Diastolic heart failure ([place])    Echo 10/2018 LVEF 55-60% with impaired relaxation.   Diverticulosis    [id number]   GERD (gastroesophageal reflux disease) 07/25/2013   Glaucoma    HOCM (hypertrophic obstructive cardiomyopathy) ([place])    HYPERLIPIDEMIA 01/11/2009   HYPERTENSION 10/06/2006   Hypokalemia    Hypomagnesemia    Hypothyroidism    INSOMNIA 09/26/2008   Internal hemorrhoids    OBSTRUCTIVE SLEEP APNEA 06/23/2008   Severe OSA per sleep study 2010, Rx a CPAP   Pain in joint, multiple sites 11/10/2006   Pericardial effusion    Pericarditis    UNSPECIFIED ANEMIA 12/10/2009   UTI (urinary tract infection) 11/2017    Past Surgical History:  Procedure Laterality Date   BIOPSY  12/28/2017   Procedure: BIOPSY;  Surgeon: Irving Copas., MD;  Location: [facility];  Service: Gastroenterology;;   BIOPSY  05/19/2019   Procedure: BIOPSY;  Surgeon: Daneil Dolin, MD;  [facility];  Service: Endoscopy;;  gastric   COLONOSCOPY  08/01/2011   Procedure: COLONOSCOPY;  Surgeon: Inda Castle, MD;  Location: [facility];  Service: Endoscopy;  Laterality: N/A;. hyperplastic polyp removed, hemorrhoids s/p banding, diverticulosis, next tcs 10 years.    ESOPHAGOGASTRODUODENOSCOPY (EGD) WITH PROPOFOL N/A  12/28/2017   Procedure: ESOPHAGOGASTRODUODENOSCOPY (EGD) WITH PROPOFOL;  Surgeon: Rush Landmark Telford Nab., MD;  Location: [facility];  Service: Gastroenterology;  Laterality: N/A; h.pylori gastiris, tortuous esophagus with esophageal bx (no EOE), medium sized hiatal hernia   ESOPHAGOGASTRODUODENOSCOPY (EGD) WITH PROPOFOL N/A 05/19/2019   Procedure: ESOPHAGOGASTRODUODENOSCOPY (EGD) WITH PROPOFOL; severe reflux esophagitis, medium size hiatal hernia, gastric erosions with biopsies positive for mild chronic gastritis and negative for H. pylori.  No dilation performed.   LEFT HEART CATH AND CORONARY ANGIOGRAPHY N/A 10/22/2017   Procedure: LEFT HEART CATH AND CORONARY ANGIOGRAPHY;  Surgeon: Jettie Booze, MD;  Location: [facility];  Service: Cardiovascular;  Laterality: N/A;   Left knee replacement  07/2007   Right knee replacement  2005    Social History   Socioeconomic History   Marital status: Widowed    Spouse name: Not on file   Number of children: 4   Years of education: Not on file   Highest education level: Not on file  Occupational History   Occupation: disability    Employer: UNEMPLOYED  Tobacco Use   Smoking status: Former Smoker    Packs/day: 0.20    Years: 4.00    Pack years: 0.80  Types: Cigarettes    Quit date: 05/13/1995    Years since quitting: 24.4   Smokeless tobacco: Never Used  Vaping Use   Vaping Use: Never used  Substance and Sexual Activity   Alcohol use: Not Currently   Drug use: No   Sexual activity: Not Currently  Other Topics Concern   Not on file  Social History Narrative   Widow , lives by herself   Lost a son, 3 living    No children in [place], Kraemer lives in [place], he visits and helps w/ shopping    Lost husband   Social Determinants of Health   Financial Resource Strain:    Difficulty of Paying Living Expenses:   Food Insecurity:    Worried About Charity fundraiser in the Last Year:    Arboriculturist in the  Last Year:   Transportation Needs:    Film/video editor (Medical):    Lack of Transportation (Non-Medical):   Physical Activity:    Days of Exercise per Week:    Minutes of Exercise per Session:   Stress:    Feeling of Stress :   Social Connections:    Frequency of Communication with Friends and Family:    Frequency of Social Gatherings with Friends and Family:    Attends Religious Services:    Active Member of Clubs or Organizations:    Attends Music therapist:    Marital Status:     Family History  Problem Relation Age of Onset   Asthma Mother    Stroke Mother    Diabetes Mother    Diabetes Other        M, B, S   Hypertension Sister        M, S,B   Pancreatic cancer Brother    Colon cancer Neg Hx    Prostate cancer Neg Hx    Breast cancer Neg Hx    Esophageal cancer Neg Hx    Liver disease Neg Hx    Rectal cancer Neg Hx    Stomach cancer Neg Hx    Inflammatory bowel disease Neg Hx     No facility-administered encounter medications on file as of 11/01/2019.   Outpatient Encounter Medications as of 11/01/2019  Medication Sig   albuterol (VENTOLIN HFA) 108 (90 Base) MCG/ACT inhaler Inhale 2 puffs into the lungs every 4 (four) hours as needed for wheezing or shortness of breath.   alum & mag hydroxide-simeth (MAALOX/MYLANTA) 200-200-20 MG/5ML suspension Take 30 mLs by mouth every 4 (four) hours as needed for indigestion.   aspirin EC 81 MG tablet Take 1 tablet (81 mg total) by mouth daily.   carvedilol (COREG) 25 MG tablet Take 1 tablet (25 mg total) by mouth 2 (two) times daily. (Patient taking differently: Take 25 mg by mouth in the morning and at bedtime. )   Cholecalciferol 50 MCG (2000 UT) CAPS Take 1 capsule (2,000 Units total) by mouth daily with breakfast.   dexlansoprazole (DEXILANT) 60 MG capsule Take 1 capsule (60 mg total) by mouth daily.   escitalopram (LEXAPRO) 20 MG tablet TAKE 1 TABLET BY MOUTH  DAILY  (Patient taking differently: Take 20 mg by mouth daily. )   Fluticasone-Salmeterol (WIXELA INHUB) 250-50 MCG/DOSE AEPB Inhale 1 puff into the lungs 2 (two) times daily.   furosemide (LASIX) 20 MG tablet Take 1 tablet (20 mg total) by mouth daily.   glucose blood (ACCU-CHEK GUIDE) test strip Use as instructed   HYDROcodone-acetaminophen (NORCO/VICODIN)  5-325 MG tablet Take 1 tablet by mouth every 6 (six) hours as needed.   latanoprost (XALATAN) 0.005 % ophthalmic solution Place 1 drop into both eyes at bedtime.   levothyroxine (SYNTHROID) 88 MCG tablet Take 1 tablet (88 mcg total) by mouth daily before breakfast.   metoCLOPramide (REGLAN) 5 MG tablet Take 5 mg by mouth 3 (three) times daily as needed.   ondansetron (ZOFRAN ODT) 4 MG disintegrating tablet 4mg  ODT q4 hours prn nausea/vomit   ondansetron (ZOFRAN) 4 MG tablet Take 1 tablet (4 mg total) by mouth every 8 (eight) hours as needed for nausea or vomiting.   OXYGEN Inhale 2 L into the lungs daily as needed (shortness of breath).    Pancrelipase, Lip-Prot-Amyl, (ZENPEP) 25000-79000 units CPEP Take 2 capsules (50,000 units) with breakfast lunch and dinner.  Take 1 capsule (25,000 units) with snacks up to twice daily.   potassium chloride SA (KLOR-CON) 20 MEQ tablet Take 1 tablet (20 mEq total) by mouth 2 (two) times daily.   predniSONE (DELTASONE) 5 MG tablet Take 5 mg by mouth daily.   rosuvastatin (CRESTOR) 40 MG tablet TAKE 1 TABLET BY MOUTH IN  THE EVENING (Patient taking differently: Take 40 mg by mouth at bedtime. )   verapamil (CALAN SR) 120 MG CR tablet Take 1 tablet (120 mg total) by mouth daily.    ALLERGIES: No Known Allergies  VACCINATION STATUS: Immunization History  Administered Date(s) Administered   Fluad Quad(high Dose 65+) 02/23/2019   Influenza Whole 05/12/2006, 03/15/2007   Influenza, High Dose Seasonal PF 02/22/2015, 02/25/2016, 03/31/2017, 02/24/2018   Influenza, Seasonal, Injecte, Preservative  Fre 05/28/2012   Influenza,inj,Quad PF,6+ Mos 05/12/2012, 04/25/2013, 06/01/2014   Moderna SARS-COVID-2 Vaccination 07/23/2019, 08/24/2019   Pneumococcal Conjugate-13 10/26/2013   Pneumococcal Polysaccharide-23 05/28/2012   Td 08/17/2006, 03/31/2017   Zoster 10/29/2012    HPI  69 year old female patient with multiple medical problems as above.  She is returning for follow-up after she was seen in consult for history of diabetes currently not on any treatment.   -Her point-of-care A1c today was 5%, compared to 5.2% and 5.5% during her prior visits.  She was found to be weak, subsequent CBG was 26 while patient was talking and upright in her wheelchair. -After consuming 2 cans of orange juice 1 container of applesauce her blood sugar improved 47. -Fingerstick 50 minutes later revealed CBG of 27 and with change in mental status with the patient.  EMS was called, patient received an ampule of dextrose 10% with improvement in her mental status and blood glucose. -She was later to taken to anything hospital for continued treatment and evaluation. -Her home blood glucose reviewed and showed, frequent drops in her blood glucose into 40s and 50s.  -She did have previously documented hypoglycemic ranges into the 60s without symptoms. -Historically, patient with limited oral intake due to erosive gastroenteritis, on follow-up with GI. -Her body weight fluctuated between 148-152 pounds over the last several month.   She underwent GI work-up with esophagogastroduodenoscopy, was remarkable for only chronic active gastritis with Helicobacter pylori organisms, no evidence of dysplasia or malignancy. She continues to have  on and off nausea, vomiting, no diarrhea.  Her recent work-up for adrenal insufficiency was negative.   -She was recently diagnosed with scleroderma, currently on prednisone 5 mg p.o. daily.  She also has hypothyroidism which required support with thyroid hormone currently on  levothyroxine 88 mcg p.o. every morning.  She denies palpitations, tremors, nor heat intolerance.  She denies  dysphagia, shortness of breath, nor voice change. -  She does not have good social support, lives alone.  She admits that she does not eat well, mostly does not cook due to depression.  She has a home aide who helps with cooking and cleaning.   Review of Systems  Constitutional: + Fluctuating body weight,  + fatigue, no subjective hyperthermia, no subjective hypothermia Eyes: no blurry vision, no xerophthalmia ENT: no sore throat, no nodules palpated in throat, no dysphagia/odynophagia, no hoarseness  Musculoskeletal: + Diffuse body aches, status post left knee replacement, disequilibrium requiring walker to ambulate.     Skin: no rashes Neurological: no tremors, no numbness, no tingling, no dizziness Psychiatric: + depression, + anxiety   Objective:    BP (!) 82/58    Pulse 76    Ht 5\' 8"  (1.727 m)    Wt 152 lb (68.9 kg)    BMI 23.11 kg/m   Wt Readings from Last 3 Encounters:  11/01/19 152 lb (68.9 kg)  11/01/19 152 lb (68.9 kg)  10/17/19 150 lb 6.4 oz (68.2 kg)     Physical Exam  physical Exam- Limited  Constitutional:  Body mass index is 23.11 kg/m. , + Altered mental status due to hypoglycemia in clinic.    Eyes:  EOMI, no exophthalmos Neck: Supple Respiratory: Adequate breathing efforts Musculoskeletal: +Uses a walker to ambulate.  No gross deformities, strength intact in all four extremities, no gross restriction of joint movements Skin:  no rashes, no hyperemia Neurological: no tremor with outstretched hands.   CMP ( most recent) CMP     Component Value Date/Time   NA 136 11/01/2019 1525   NA 135 06/28/2019 1112   K 3.2 (L) 11/01/2019 1525   CL 96 (L) 11/01/2019 1525   CO2 30 11/01/2019 1525   GLUCOSE 203 (H) 11/01/2019 1525   BUN 30 (H) 11/01/2019 1525   BUN 13 06/28/2019 1112   CREATININE 1.32 (H) 11/01/2019 1525   CREATININE 0.87 08/17/2019  1303   CALCIUM 7.5 (L) 11/01/2019 1525   PROT 5.8 (L) 11/01/2019 1525   ALBUMIN 1.8 (L) 11/01/2019 1525   AST 19 11/01/2019 1525   ALT 11 11/01/2019 1525   ALKPHOS 61 11/01/2019 1525   BILITOT 0.6 11/01/2019 1525   GFRNONAA 41 (L) 11/01/2019 1525   GFRNONAA 67 08/17/2019 1303   GFRAA 47 (L) 11/01/2019 1525   GFRAA 78 08/17/2019 1303     Diabetic Labs (most recent): Lab Results  Component Value Date   HGBA1C 5.0 11/01/2019   HGBA1C 5.2 04/28/2019   HGBA1C 5.5 12/09/2018     Lipid Panel ( most recent) Lipid Panel     Component Value Date/Time   CHOL 94 11/18/2017 0012   TRIG 110 11/18/2017 0012   HDL 30 (L) 11/18/2017 0012   CHOLHDL 3.1 11/18/2017 0012   VLDL 22 11/18/2017 0012   LDLCALC 42 11/18/2017 0012    Results for KRISTALYN, BERGSTRESSER (MRN [id number]) as of 04/28/2019 17:58  Ref. Range 02/22/2019 11:07 02/22/2019 11:08 04/28/2019 13:28  Cortisol - AM Latest Ref Range: 6.7 - 22.6 ug/dL 7.0    Hemoglobin A1C Latest Ref Range: 4.0 - 5.6 %   5.2  TSH Latest Ref Range: 0.350 - 4.500 uIU/mL 5.421 (H)    T4,Free(Direct) Latest Ref Range: 0.61 - 1.12 ng/dL 0.93    Thyroperoxidase Ab SerPl-aCnc Latest Ref Range: 0 - 34 IU/mL  104 (H)   Thyroglobulin Antibody Latest Ref Range: 0.0 - 0.9 IU/mL  <1.0  Review of her records show multinodular goiter based on ultrasound from August 2019: Right lobe 5.1 cm x 1.9 cm x 2.3 cm,  2.2 cm nodule on the right  Lobe was amenable for biopsy, but no report card of biopsy on her thyroid. Left lobe 4.1 cm x 1.1 0.1 cm   Fine-needle aspiration on March 09, 2019:  A. THYROID, RIGHT, FINE NEEDLE ASPIRATION:  FINAL MICROSCOPIC DIAGNOSIS:  - Consistent with benign follicular nodule (Bethesda category II)   CT scan of the abdomen with and without contrast on Sep 19, 2019 IMPRESSION: 1. No left kidney lesion identified to correspond to the ultrasound abnormality 2. Several scattered cystic structures within the head and body  of pancreas are identified. The largest of these measures 0.9 cm. Follow-up imaging in 24 months with pancreas protocol abdominal MRI without and with contrast material is advised. This recommendation follows ACR consensus guidelines: Management of Incidental Pancreatic Cysts: A White Paper of the ACR Incidental Findings Committee. J Am Coll Radiol 0814;48:[id number]. 3. Hepatic steatosis. 4. Gallstones. 5. Changes of chronic interstitial lung disease.  Assessment & Plan:   1.  Hypoglycemia 2.  Nodular goiter  3.  Hypothyroidism   -She was treated in the clinic with D10 IV push, and was taken [facility] via [facility].   -Patient with chronic moderate to severe  Random hypoglycemia, mostly related to malnutrition/inadequate oral intake, nonexistent nutritional reserve. -Patient with prior history of diabetes, currently not on treatment. -Her recent work-up for adrenal insufficiency is negative.  She is however on prednisone 5 mg daily related to her diagnosis of scleroderma.  -After her stabilization for glycemic profile, Given her recent finding of pancreatic cysts, she will be sent to lab for total and free insulin, proinsulin, and C-peptide in the next several days.  -She has psychosocial issues which interfere with her care, with no or nonexistent social support.  -She has several chronic medical problems worsened by her nutritional debility, to the point where she may not tolerate any surgical interventions.  She may need permanent feeding mechanism.  She is encouraged to continue follow-up with GI.   -After discharge, she is encouraged to start monitoring blood glucose 4 times a day-before meals and at bedtime with return with her meter for reevaluation.  -I discussed with her to be more generous in taking carbs including pasta, rice, ground meat, fruits.   Regarding her hypothyroidism, her recent thyroid function tests are consistent with under replacement.  This is likely due  to the fact that she is losing her thyroid hormone through vomiting.  Levothyroxine 88 mcg p.o. daily is still appropriate dose for her.    - We discussed about the correct intake of her thyroid hormone, on empty stomach at fasting, with water, separated by at least 30 minutes from breakfast and other medications,  and separated by more than 4 hours from calcium, iron, multivitamins, acid reflux medications (PPIs). -Patient is made aware of the fact that thyroid hormone replacement is needed for life, dose to be adjusted by periodic monitoring of thyroid function tests.   Multinodular goiter: She has a documented, 2.2 cm suspicious nodule on her right lobe.  Her subsequent fine-needle aspiration was negative for malignancy.  She will not need intervention at this time.        - Time spent on this patient care encounter:  40 minutes of which 50% was spent in stabilizing her for hypoglycemia, reviewing  her current and  previous labs /imaging  studies and medications  doses and developing a plan for long term care. Frederich Chick  participated in the discussions.  She was sent to the hospital via EMS.    Follow up plan: - Return in about 2 weeks (around 11/15/2019) for F/U with Pre-visit Labs.  Glade Lloyd, MD [facility] [address] [place], [place] [address] Phone: [phone]  Fax: [phone]    11/01/2019, 4:39 PM  This note was partially dictated with voice recognition software. Similar sounding words can be transcribed inadequately or may not  be corrected upon review.

## 2019-11-01 NOTE — ED Notes (Signed)
Gave pt peanut butter graham crackers and two cups of apple juice. Says she doesn't want eat fast until she gets her nausea meds.

## 2019-11-01 NOTE — ED Notes (Signed)
Attempted to call report x 1  

## 2019-11-02 DIAGNOSIS — I472 Ventricular tachycardia: Secondary | ICD-10-CM | POA: Diagnosis not present

## 2019-11-02 DIAGNOSIS — Z7189 Other specified counseling: Secondary | ICD-10-CM | POA: Diagnosis not present

## 2019-11-02 DIAGNOSIS — I110 Hypertensive heart disease with heart failure: Secondary | ICD-10-CM | POA: Diagnosis present

## 2019-11-02 DIAGNOSIS — K862 Cyst of pancreas: Secondary | ICD-10-CM | POA: Diagnosis present

## 2019-11-02 DIAGNOSIS — I10 Essential (primary) hypertension: Secondary | ICD-10-CM | POA: Diagnosis not present

## 2019-11-02 DIAGNOSIS — J9611 Chronic respiratory failure with hypoxia: Secondary | ICD-10-CM | POA: Diagnosis present

## 2019-11-02 DIAGNOSIS — N179 Acute kidney failure, unspecified: Secondary | ICD-10-CM | POA: Diagnosis present

## 2019-11-02 DIAGNOSIS — K3184 Gastroparesis: Secondary | ICD-10-CM | POA: Diagnosis present

## 2019-11-02 DIAGNOSIS — K2100 Gastro-esophageal reflux disease with esophagitis, without bleeding: Secondary | ICD-10-CM | POA: Diagnosis present

## 2019-11-02 DIAGNOSIS — E11649 Type 2 diabetes mellitus with hypoglycemia without coma: Secondary | ICD-10-CM | POA: Diagnosis present

## 2019-11-02 DIAGNOSIS — E039 Hypothyroidism, unspecified: Secondary | ICD-10-CM | POA: Diagnosis not present

## 2019-11-02 DIAGNOSIS — J849 Interstitial pulmonary disease, unspecified: Secondary | ICD-10-CM | POA: Diagnosis present

## 2019-11-02 DIAGNOSIS — Z01810 Encounter for preprocedural cardiovascular examination: Secondary | ICD-10-CM | POA: Diagnosis not present

## 2019-11-02 DIAGNOSIS — R627 Adult failure to thrive: Secondary | ICD-10-CM | POA: Diagnosis not present

## 2019-11-02 DIAGNOSIS — H409 Unspecified glaucoma: Secondary | ICD-10-CM | POA: Diagnosis present

## 2019-11-02 DIAGNOSIS — K567 Ileus, unspecified: Secondary | ICD-10-CM | POA: Diagnosis not present

## 2019-11-02 DIAGNOSIS — R112 Nausea with vomiting, unspecified: Secondary | ICD-10-CM | POA: Diagnosis not present

## 2019-11-02 DIAGNOSIS — K869 Disease of pancreas, unspecified: Secondary | ICD-10-CM | POA: Diagnosis not present

## 2019-11-02 DIAGNOSIS — I421 Obstructive hypertrophic cardiomyopathy: Secondary | ICD-10-CM | POA: Diagnosis present

## 2019-11-02 DIAGNOSIS — R9389 Abnormal findings on diagnostic imaging of other specified body structures: Secondary | ICD-10-CM | POA: Diagnosis not present

## 2019-11-02 DIAGNOSIS — B370 Candidal stomatitis: Secondary | ICD-10-CM | POA: Diagnosis not present

## 2019-11-02 DIAGNOSIS — M7989 Other specified soft tissue disorders: Secondary | ICD-10-CM | POA: Diagnosis present

## 2019-11-02 DIAGNOSIS — Z20822 Contact with and (suspected) exposure to covid-19: Secondary | ICD-10-CM | POA: Diagnosis present

## 2019-11-02 DIAGNOSIS — E119 Type 2 diabetes mellitus without complications: Secondary | ICD-10-CM | POA: Diagnosis not present

## 2019-11-02 DIAGNOSIS — E43 Unspecified severe protein-calorie malnutrition: Secondary | ICD-10-CM | POA: Diagnosis present

## 2019-11-02 DIAGNOSIS — R64 Cachexia: Secondary | ICD-10-CM | POA: Diagnosis present

## 2019-11-02 DIAGNOSIS — E1143 Type 2 diabetes mellitus with diabetic autonomic (poly)neuropathy: Secondary | ICD-10-CM | POA: Diagnosis present

## 2019-11-02 DIAGNOSIS — J4530 Mild persistent asthma, uncomplicated: Secondary | ICD-10-CM | POA: Diagnosis not present

## 2019-11-02 DIAGNOSIS — K7460 Unspecified cirrhosis of liver: Secondary | ICD-10-CM | POA: Diagnosis not present

## 2019-11-02 DIAGNOSIS — I422 Other hypertrophic cardiomyopathy: Secondary | ICD-10-CM | POA: Diagnosis not present

## 2019-11-02 DIAGNOSIS — F329 Major depressive disorder, single episode, unspecified: Secondary | ICD-10-CM | POA: Diagnosis present

## 2019-11-02 DIAGNOSIS — R1310 Dysphagia, unspecified: Secondary | ICD-10-CM | POA: Diagnosis not present

## 2019-11-02 DIAGNOSIS — J449 Chronic obstructive pulmonary disease, unspecified: Secondary | ICD-10-CM | POA: Diagnosis present

## 2019-11-02 DIAGNOSIS — E162 Hypoglycemia, unspecified: Secondary | ICD-10-CM | POA: Diagnosis present

## 2019-11-02 DIAGNOSIS — Z515 Encounter for palliative care: Secondary | ICD-10-CM | POA: Diagnosis not present

## 2019-11-02 DIAGNOSIS — Z66 Do not resuscitate: Secondary | ICD-10-CM | POA: Diagnosis not present

## 2019-11-02 DIAGNOSIS — R634 Abnormal weight loss: Secondary | ICD-10-CM | POA: Diagnosis not present

## 2019-11-02 DIAGNOSIS — I5032 Chronic diastolic (congestive) heart failure: Secondary | ICD-10-CM | POA: Diagnosis present

## 2019-11-02 DIAGNOSIS — I959 Hypotension, unspecified: Secondary | ICD-10-CM | POA: Diagnosis not present

## 2019-11-02 LAB — BASIC METABOLIC PANEL
Anion gap: 11 (ref 5–15)
Anion gap: 8 (ref 5–15)
BUN: 29 mg/dL — ABNORMAL HIGH (ref 8–23)
BUN: 28 mg/dL — ABNORMAL HIGH (ref 8–23)
CO2: 32 mmol/L (ref 22–32)
CO2: 33 mmol/L — ABNORMAL HIGH (ref 22–32)
Calcium: 7.8 mg/dL — ABNORMAL LOW (ref 8.9–10.3)
Calcium: 7.8 mg/dL — ABNORMAL LOW (ref 8.9–10.3)
Chloride: 95 mmol/L — ABNORMAL LOW (ref 98–111)
Chloride: 96 mmol/L — ABNORMAL LOW (ref 98–111)
Creatinine, Ser: 1.28 mg/dL — ABNORMAL HIGH (ref 0.44–1.00)
Creatinine, Ser: 1.26 mg/dL — ABNORMAL HIGH (ref 0.44–1.00)
GFR calc Af Amer: 49 mL/min — ABNORMAL LOW (ref >60)
GFR calc Af Amer: 50 mL/min — ABNORMAL LOW (ref >60)
GFR calc non Af Amer: 42 mL/min — ABNORMAL LOW (ref >60)
GFR calc non Af Amer: 43 mL/min — ABNORMAL LOW (ref >60)
Glucose, Bld: 135 mg/dL — ABNORMAL HIGH (ref 70–99)
Glucose, Bld: 192 mg/dL — ABNORMAL HIGH (ref 70–99)
Potassium: 3.2 mmol/L — ABNORMAL LOW (ref 3.5–5.1)
Potassium: 3.5 mmol/L (ref 3.5–5.1)
Sodium: 138 mmol/L (ref 135–145)
Sodium: 137 mmol/L (ref 135–145)

## 2019-11-02 LAB — GLUCOSE, CAPILLARY
Glucose-Capillary: 110 mg/dL — ABNORMAL HIGH (ref 70–99)
Glucose-Capillary: 171 mg/dL — ABNORMAL HIGH (ref 70–99)
Glucose-Capillary: 22 mg/dL — CL (ref 70–99)
Glucose-Capillary: 28 mg/dL — CL (ref 70–99)
Glucose-Capillary: 63 mg/dL — ABNORMAL LOW (ref 70–99)
Glucose-Capillary: 85 mg/dL (ref 70–99)
Glucose-Capillary: 86 mg/dL (ref 70–99)
Glucose-Capillary: 91 mg/dL (ref 70–99)
Glucose-Capillary: <10 mg/dL — CL (ref 70–99)
Glucose-Capillary: <10 mg/dL — CL (ref 70–99)

## 2019-11-02 MED ORDER — METOCLOPRAMIDE HCL 5 MG/ML IJ SOLN
5.0000 mg | Freq: Four times a day (QID) | INTRAMUSCULAR | Status: AC
  Administered 2019-11-02 – 2019-11-05 (×14): 5 mg via INTRAVENOUS
  Filled 2019-11-02 (×14): qty 2

## 2019-11-02 MED ORDER — DEXTROSE 50 % IV SOLN
INTRAVENOUS | Status: AC
  Administered 2019-11-02: 50 mL
  Filled 2019-11-02: qty 50

## 2019-11-02 MED ORDER — DEXTROSE 50 % IV SOLN: 25.0000 g | INTRAVENOUS | Status: AC

## 2019-11-02 MED ORDER — PANCRELIPASE (LIP-PROT-AMYL) 12000-38000 UNITS PO CPEP
24000.0000 [IU] | ORAL_CAPSULE | Freq: Three times a day (TID) | ORAL | Status: DC
  Administered 2019-11-02: 24000 [IU] via ORAL
  Filled 2019-11-02 (×3): qty 2

## 2019-11-02 MED ORDER — DEXTROSE 50 % IV SOLN
25.0000 g | Freq: Once | INTRAVENOUS | Status: AC
  Filled 2019-11-02: qty 50

## 2019-11-02 MED ORDER — PANTOPRAZOLE SODIUM 40 MG IV SOLR
40.0000 mg | Freq: Two times a day (BID) | INTRAVENOUS | Status: DC
  Administered 2019-11-02 – 2019-11-21 (×38): 40 mg via INTRAVENOUS
  Filled 2019-11-02 (×38): qty 40

## 2019-11-02 NOTE — Progress Notes (Signed)
PROGRESS NOTE    [name]  ONG:[id number] DOB: 1950-06-13 DOA: 11/01/2019 PCP: Maryruth Hancock, MD    Brief Narrative:  HPI: [name] is a 69 y.o. female with medical history significant for COPD asthma with chronic respiratory failure on 3 L, paroxysmal atrial fibrillation, HOCM,liver cirrhosis, diabetes mellitus. Patient was sent to the ED via EMS from her endocrinologist's office with blood sugar of 32, she was given D50 and blood sugar increased to 47, but on arrival to the ED, her blood sugar was 20. Over the past 3 months she has been vomiting every day, at least 2-3 times a day, she has been vomiting almost every thing she tries to eat, with resulted in weight loss.  She reports chronic intermittent abdominal pain.  But she has no abdominal pain today.  No loose stools.  Patient is not on any medications for her diabetes.  She has chronic low white extremity swelling that that fluctuates but is currently unchanged, no chest pain, no difficulty breathing.   Assessment & Plan:   Principal Problem:   Hypoglycemia Active Problems:   Type 2 diabetes mellitus without complication, without long-term current use of insulin (HCC)   Essential hypertension   Asthma, chronic   Chronic respiratory failure with hypoxia (HCC)   ILD (interstitial lung disease) (HCC)   Hypothyroidism   Hepatic cirrhosis (HCC)   Paroxysmal atrial fibrillation (HCC)   Loss of weight   1. Hypoglycemia.  Etiology is not entirely clear, but likely related to decreased p.o. intake.  Accuracy of CBGs is questionable since readings were noted to be less than 10 while patient was awake and alert.  Blood sugar checked by blood draw during the same timeframe noted to be normal range/elevated.  Possibly, poor circulation in her fingertips could be affecting accuracy of numbers.  She is being followed by endocrinology and work-up for insulinoma has been initiated. 2. Intractable, nausea and vomiting.   Appears to be a chronic issue.  Associated with substantial weight loss.  GI following and assisting with further work-up. 3. Acute kidney injury.  Likely related to decreased p.o. intake/vomiting.  Improving with IV hydration. 4. Diabetes.  She is on any medications.  Currently having episodes of hypoglycemia. 5. Hypertension.  Stable on carvedilol and verapamil 6. Hypothyroidism.  TSH of 10.5.  Continue home dose of Synthroid. 7. COPD/chronic respiratory failure/interstitial lung disease.  Follows with pulmonology.  Continue home dose of prednisone and supplemental oxygen. 8. History of liver cirrhosis and diastolic heart failure.  Patient has significant lower extremity pitting edema, believe this may be related to hypoalbuminemia.  Holding off on Lasix in light of acute kidney injury. 9. Goals of care.  Discussed CODE STATUS and the possibility of feeding tubes with the patient.  She clearly stated that she would not want any artificial feeding or heroic measures such as CPR/intubation.   DVT prophylaxis: heparin injection 5,000 Units Start: 11/01/19 2230  Code Status: DNR Family Communication: Discussed with patient Disposition Plan: Status is: Inpatient  Remains inpatient appropriate because:Persistent severe electrolyte disturbances and IV treatments appropriate due to intensity of illness or inability to take PO   Dispo: The patient is from: Home              Anticipated d/c is to: Home              Anticipated d/c date is: > 3 days  Patient currently is not medically stable to d/c.   Consultants:   Gastroenterology  Procedures:     Antimicrobials:       Subjective: Continues to have nausea and vomiting.  Feels generally weak.  Objective: Vitals:   11/02/19 0356 11/02/19 0846 11/02/19 1306 11/02/19 1920  BP: 95/67  102/71   Pulse: 69  69   Resp: 15  17   Temp: 97.8 F (36.6 C)  97.6 F (36.4 C)   TempSrc: Oral  Oral   SpO2: 97% 100%  99%    Weight:      Height:        Intake/Output Summary (Last 24 hours) at 11/02/2019 1939 Last data filed at 11/02/2019 1900 Gross per 24 hour  Intake 1835.64 ml  Output --  Net 1835.64 ml   Filed Weights   11/01/19 1448 11/01/19 2118  Weight: 68.9 kg 69.6 kg    Examination:  General exam: Appears calm and comfortable, temporal wasting Respiratory system: Clear to auscultation. Respiratory effort normal. Cardiovascular system: S1 & S2 heard, RRR. No JVD, murmurs, rubs, gallops or clicks. No pedal edema. Gastrointestinal system: Abdomen is nondistended, soft and nontender. No organomegaly or masses felt. Normal bowel sounds heard. Central nervous system: Alert and oriented. No focal neurological deficits. Extremities: 1+ edema lower extremities bilaterally Skin: No rashes, lesions or ulcers Psychiatry: Judgement and insight appear normal. Mood & affect appropriate.     Data Reviewed: I have personally reviewed following labs and imaging studies  CBC: Recent Labs  Lab 11/01/19 1525  WBC 3.4*  NEUTROABS 2.5  HGB 10.0*  HCT 30.9*  MCV 88.5  PLT 786   Basic Metabolic Panel: Recent Labs  Lab 11/01/19 1525 11/02/19 0458 11/02/19 0807  NA 136 138 137  K 3.2* 3.2* 3.5  CL 96* 95* 96*  CO2 30 32 33*  GLUCOSE 203* 135* 192*  BUN 30* 29* 28*  CREATININE 1.32* 1.28* 1.26*  CALCIUM 7.5* 7.8* 7.8*   GFR: Estimated Creatinine Clearance: 41.9 mL/min (A) (by C-G formula based on SCr of 1.26 mg/dL (H)). Liver Function Tests: Recent Labs  Lab 11/01/19 1525  AST 19  ALT 11  ALKPHOS 61  BILITOT 0.6  PROT 5.8*  ALBUMIN 1.8*   Recent Labs  Lab 11/01/19 1525  LIPASE 22   No results for input(s): AMMONIA in the last 168 hours. Coagulation Profile: No results for input(s): INR, PROTIME in the last 168 hours. Cardiac Enzymes: No results for input(s): CKTOTAL, CKMB, CKMBINDEX, TROPONINI in the last 168 hours. BNP (last 3 results) No results for input(s): PROBNP in the  last 8760 hours. HbA1C: Recent Labs    11/01/19 1322  HGBA1C 5.0   CBG: Recent Labs  Lab 11/02/19 0811 11/02/19 0841 11/02/19 1122 11/02/19 1153 11/02/19 1621  GLUCAP <10* <10* 63* 171* 85   Lipid Profile: No results for input(s): CHOL, HDL, LDLCALC, TRIG, CHOLHDL, LDLDIRECT in the last 72 hours. Thyroid Function Tests: Recent Labs    11/01/19 1525  TSH 10.589*   Anemia Panel: No results for input(s): VITAMINB12, FOLATE, FERRITIN, TIBC, IRON, RETICCTPCT in the last 72 hours. Sepsis Labs: No results for input(s): PROCALCITON, LATICACIDVEN in the last 168 hours.  Recent Results (from the past 240 hour(s))  SARS Coronavirus 2 by RT PCR (hospital order, performed in [facility] hospital lab) Nasopharyngeal Nasopharyngeal Swab     Status: None   Collection Time: 11/01/19  3:16 PM   Specimen: Nasopharyngeal Swab  Result Value Ref Range Status  SARS Coronavirus 2 NEGATIVE NEGATIVE Final    Comment: (NOTE) SARS-CoV-2 target nucleic acids are NOT DETECTED.  The SARS-CoV-2 RNA is generally detectable in upper and lower respiratory specimens during the acute phase of infection. The lowest concentration of SARS-CoV-2 viral copies this assay can detect is 250 copies / mL. A negative result does not preclude SARS-CoV-2 infection and should not be used as the sole basis for treatment or other patient management decisions.  A negative result may occur with improper specimen collection / handling, submission of specimen other than nasopharyngeal swab, presence of viral mutation(s) within the areas targeted by this assay, and inadequate number of viral copies (<250 copies / mL). A negative result must be combined with clinical observations, patient history, and epidemiological information.  Fact Sheet for Patients:   StrictlyIdeas.no  Fact Sheet for Healthcare Providers: BankingDealers.co.za  This test is not yet approved or   cleared by the [place] FDA and has been authorized for detection and/or diagnosis of SARS-CoV-2 by FDA under an Emergency Use Authorization (EUA).  This EUA will remain in effect (meaning this test can be used) for the duration of the COVID-19 declaration under Section 564(b)(1) of the Act, 21 U.S.C. section 360bbb-3(b)(1), unless the authorization is terminated or revoked sooner.  Performed at [facility], [address], [place], [place] [address]          Radiology Studies: No results found.      Scheduled Meds: . aspirin EC  81 mg Oral Daily  . carvedilol  25 mg Oral BID  . heparin  5,000 Units Subcutaneous Q8H  . latanoprost  1 drop Both Eyes QHS  . levothyroxine  88 mcg Oral QAC breakfast  . lipase/protease/amylase  24,000 Units Oral TID AC  . metoCLOPramide (REGLAN) injection  5 mg Intravenous Q6H  . mometasone-formoterol  2 puff Inhalation BID  . pantoprazole (PROTONIX) IV  40 mg Intravenous Q12H  . predniSONE  5 mg Oral Daily  . verapamil  120 mg Oral Daily   Continuous Infusions: . dextrose 5 % and 0.9 % NaCl with KCl 40 mEq/L 75 mL/hr at 11/02/19 1511     LOS: 0 days    Time spent: 74mins    Kathie Dike, MD [facility]   If 7PM-7AM, please contact night-coverage [email or web address]  11/02/2019, 7:39 PM

## 2019-11-02 NOTE — Consult Note (Signed)
GI Inpatient Consult Note  Reason for Consult: Dr. Denton Brick    Attending Requesting Consult: nausea/vomiting, wt loss   History of Present Illness: [name] is a 69 y.o. female with PMHX COPD asthma with chronic respiratory failure on 3 L, paroxysmal atrial fibrillation, diabetes mellitus.  She was admitted to the emergency room yesterday after she was at her endocrinologist and found to have a blood sugar of 32, she was given D50 and increase to 47 but then found to be 20 in ER.  Most of history is obtained via chart review.  Patient falls asleep multiple times while taking history this morning.  In brief summary she reports nausea/vomiting ongoing at least over 1 year. She reports "vomiting all day every day", does endorse she was vomiting prior to her doctors appt when found to hypoglycemic but not vomiting worse than her baseline. Endroses some upper abd pain with the vomiting. She reports nots being able to tolerate even a liquid diet without vomiting.  She does endorse some upper esophageal dysphagia chronically to all solids and pills. She reports loose stools 1x/day, no blood in stool.  Denies significant lower abdominal pain.   She reports she lives by herself.  She has a cousin who lives nearby and checks on her some.  She has 3 sons, 1 lives in [place], others live out of state.  She denies current alcohol and tobacco intake.  She denies NSAID intake.  She does note she previously drank alcohol heavily but has not drank in the past few years.    Last EGD 05/19/19 with severe reflux esophagitis, medium sized hiatal hernia, gastric erosions s/p biopsied.No dilation performed.Pathology with mild chronic gastritis, negative for H. Pylori.    Last colonoscopy in 2013 with recommendations to repeat in 10 years   Past Medical History:  Past Medical History:  Diagnosis Date  . Anterolisthesis    Grade 1, L4-5  . Bronchospasm 05/28/2012  . COPD (chronic obstructive  pulmonary disease) ([place])   . DEGENERATIVE JOINT DISEASE 10/06/2006  . DEPRESSION 09/26/2008  . DIABETES MELLITUS 10/06/2006  . Diastolic heart failure ([place])    Echo 10/2018 LVEF 55-60% with impaired relaxation.  . Diverticulosis    1610,9604  . GERD (gastroesophageal reflux disease) 07/25/2013  . Glaucoma   . HOCM (hypertrophic obstructive cardiomyopathy) ([place])   . HYPERLIPIDEMIA 01/11/2009  . HYPERTENSION 10/06/2006  . Hypokalemia   . Hypomagnesemia   . Hypothyroidism   . INSOMNIA 09/26/2008  . Internal hemorrhoids   . OBSTRUCTIVE SLEEP APNEA 06/23/2008   Severe OSA per sleep study 2010, Rx a CPAP  . Pain in joint, multiple sites 11/10/2006  . Pericardial effusion   . Pericarditis   . UNSPECIFIED ANEMIA 12/10/2009  . UTI (urinary tract infection) 11/2017    Problem List: Patient Active Problem List   Diagnosis Date Noted  . Hypoglycemia 11/01/2019  . Pancreatic cyst 10/17/2019  . Esophageal dysmotility 09/29/2019  . Rib pain on right side 08/18/2019  . Diarrhea 08/12/2019  . Loss of weight 04/28/2019  . Paroxysmal atrial fibrillation ([place]) 03/01/2019  . Multinodular goiter 01/26/2019  . Regurgitation of food 01/21/2019  . Calculus of gallbladder without cholecystitis without obstruction 01/21/2019  . Numbness and tingling in both hands 12/25/2018  . Chronic cholecystitis with calculus 12/16/2018  . Abnormal LFTs 12/16/2018  . Abnormal weight loss 12/16/2018  . Upper abdominal pain   . Elevated transaminase level   . Hepatic cirrhosis ([place])   . Nausea and vomiting  in adult   . Acute cholecystitis 12/09/2018  . Pyrosis 03/12/2018  . History of Helicobacter pylori infection 02/01/2018  . Dysphagia 02/01/2018  . Anxiety and depression   . Hypothyroidism 12/22/2017  . ILD (interstitial lung disease) ([place]) 11/25/2017  . Chronic respiratory failure with hypoxia ([place]) 11/12/2017  . Pericardial effusion   . Paresthesia 10/19/2017  . Neck pain 10/19/2017  . PCP NOTES >>>  02/23/2015  . Nocturnal oxygen desaturation 01/02/2015  . Asthma, chronic 01/02/2015  . Gastroesophageal reflux disease 07/25/2013  . DOE (dyspnea on exertion) 04/25/2013  . Internal hemorrhoids without mention of complication 66/10/3014  . Medicare annual wellness visit, subsequent 06/06/2011  . Anemia 12/10/2009  . SKIN LESION 08/06/2009  . Hyperlipidemia 01/11/2009  . Depression 09/26/2008  . INSOMNIA 09/26/2008  . OBSTRUCTIVE SLEEP APNEA 06/23/2008  . Chest pain 11/18/2007  . Type 2 diabetes mellitus without complication, without long-term current use of insulin ([place]) 10/06/2006  . Essential hypertension 10/06/2006  . Osteoarthritis 10/06/2006    Past Surgical History: Past Surgical History:  Procedure Laterality Date  . BIOPSY  12/28/2017   Procedure: BIOPSY;  Surgeon: Rush Landmark Telford Nab., MD;  Location: [facility];  Service: Gastroenterology;;  . BIOPSY  05/19/2019   Procedure: BIOPSY;  Surgeon: Daneil Dolin, MD;  [facility];  Service: Endoscopy;;  gastric  . COLONOSCOPY  08/01/2011   Procedure: COLONOSCOPY;  Surgeon: Inda Castle, MD;  Location: [facility];  Service: Endoscopy;  Laterality: N/A;. hyperplastic polyp removed, hemorrhoids s/p banding, diverticulosis, next tcs 10 years.   . ESOPHAGOGASTRODUODENOSCOPY (EGD) WITH PROPOFOL N/A 12/28/2017   Procedure: ESOPHAGOGASTRODUODENOSCOPY (EGD) WITH PROPOFOL;  Surgeon: Rush Landmark Telford Nab., MD;  Location: [facility];  Service: Gastroenterology;  Laterality: N/A; h.pylori gastiris, tortuous esophagus with esophageal bx (no EOE), medium sized hiatal hernia  . ESOPHAGOGASTRODUODENOSCOPY (EGD) WITH PROPOFOL N/A 05/19/2019   Procedure: ESOPHAGOGASTRODUODENOSCOPY (EGD) WITH PROPOFOL; severe reflux esophagitis, medium size hiatal hernia, gastric erosions with biopsies positive for mild chronic gastritis and negative for H. pylori.  No dilation performed.  Marland Kitchen LEFT HEART CATH AND CORONARY ANGIOGRAPHY N/A 10/22/2017     Procedure: LEFT HEART CATH AND CORONARY ANGIOGRAPHY;  Surgeon: Jettie Booze, MD;  Location: [facility];  Service: Cardiovascular;  Laterality: N/A;  . 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 Right knee replacement  2005    Allergies: No Known Allergies  Home Medications: Medications Prior to Admission  Medication Sig Dispense Refill Last Dose  . albuterol (VENTOLIN HFA) 108 (90 Base) MCG/ACT inhaler Inhale 2 puffs into the lungs every 4 (four) hours as needed for wheezing or shortness of breath. 18 g 5 10/31/2019 at Unknown time  . alum & mag hydroxide-simeth (MAALOX/MYLANTA) 200-200-20 MG/5ML suspension Take 30 mLs by mouth every 4 (four) hours as needed for indigestion. 355 mL 0 10/31/2019 at Unknown time  . aspirin EC 81 MG tablet Take 1 tablet (81 mg total) by mouth daily. 90 tablet 3 10/31/2019 at Unknown time  . carvedilol (COREG) 25 MG tablet Take 1 tablet (25 mg total) by mouth 2 (two) times daily. (Patient taking differently: Take 25 mg by mouth in the morning and at bedtime. ) 60 tablet 11 10/31/2019 at Unknown time  . Cholecalciferol 50 MCG (2000 UT) CAPS Take 1 capsule (2,000 Units total) by mouth daily with breakfast. 90 capsule 1 11/01/2019 at Unknown time  . colchicine 0.6 MG tablet Take 0.6 mg by mouth daily.   10/31/2019 at Unknown time  .  dexlansoprazole (DEXILANT) 60 MG capsule Take 1 capsule (60 mg total) by mouth daily. 90 capsule 1 10/31/2019 at Unknown time  . Fluticasone-Salmeterol (WIXELA INHUB) 250-50 MCG/DOSE AEPB Inhale 1 puff into the lungs 2 (two) times daily.   10/31/2019 at Unknown time  . furosemide (LASIX) 20 MG tablet Take 1 tablet (20 mg total) by mouth daily. 90 tablet 3 11/01/2019 at Unknown time  . latanoprost (XALATAN) 0.005 % ophthalmic solution Place 1 drop into both eyes at bedtime.   10/31/2019 at Unknown time  . levothyroxine (SYNTHROID) 88 MCG tablet Take 1 tablet (88 mcg total) by mouth daily before breakfast. 90 tablet 1 11/01/2019 at Unknown time   . metoCLOPramide (REGLAN) 5 MG tablet Take 5 mg by mouth 3 (three) times daily as needed for nausea or vomiting.    Past Week at Unknown time  . OXYGEN Inhale 2 L into the lungs daily as needed (shortness of breath).    Past Week at Unknown time  . Pancrelipase, Lip-Prot-Amyl, (ZENPEP) 25000-79000 units CPEP Take 2 capsules (50,000 units) with breakfast lunch and dinner.  Take 1 capsule (25,000 units) with snacks up to twice daily. (Patient taking differently: Take 1-2 capsules by mouth See admin instructions. Take 2 capsules (50,000 units) with breakfast lunch and dinner.  Take 1 capsule (25,000 units) with snacks up to twice daily.) 240 capsule 3 10/31/2019 at Unknown time  . potassium chloride (KLOR-CON 10) 10 MEQ tablet Take 10 mEq by mouth daily.   10/31/2019 at Unknown time  . predniSONE (DELTASONE) 5 MG tablet Take 5 mg by mouth daily.   Past Week at Unknown time  . rosuvastatin (CRESTOR) 40 MG tablet TAKE 1 TABLET BY MOUTH IN  THE EVENING (Patient taking differently: Take 40 mg by mouth at bedtime. ) 90 tablet 3 10/31/2019 at Unknown time  . verapamil (CALAN) 120 MG tablet Take 120 mg by mouth daily.   10/31/2019 at Unknown time  . escitalopram (LEXAPRO) 20 MG tablet TAKE 1 TABLET BY MOUTH  DAILY (Patient not taking: Reported on 11/01/2019) 90 tablet 3 Not Taking at Unknown time  . glucose blood (ACCU-CHEK GUIDE) test strip Use as instructed (Patient not taking: Reported on 11/01/2019) 150 each 2 Not Taking at Unknown time  . HYDROcodone-acetaminophen (NORCO/VICODIN) 5-325 MG tablet Take 1 tablet by mouth every 6 (six) hours as needed. (Patient not taking: Reported on 11/01/2019) 20 tablet 0 Completed Course at Unknown time  . ondansetron (ZOFRAN ODT) 4 MG disintegrating tablet 4mg  ODT q4 hours prn nausea/vomit (Patient not taking: Reported on 11/01/2019) 12 tablet 0 Not Taking at Unknown time  . ondansetron (ZOFRAN) 4 MG tablet Take 1 tablet (4 mg total) by mouth every 8 (eight) hours as needed for  nausea or vomiting. (Patient not taking: Reported on 11/01/2019) 30 tablet 1 Not Taking at Unknown time  . potassium chloride SA (KLOR-CON) 20 MEQ tablet Take 1 tablet (20 mEq total) by mouth 2 (two) times daily. (Patient not taking: Reported on 11/01/2019) 180 tablet 1 Not Taking at Unknown time  . verapamil (CALAN SR) 120 MG CR tablet Take 1 tablet (120 mg total) by mouth daily. (Patient not taking: Reported on 11/01/2019) 30 tablet 6 Not Taking at Unknown time   Home medication reconciliation was completed with the patient.   Scheduled Inpatient Medications:   . aspirin EC  81 mg Oral Daily  . carvedilol  25 mg Oral BID  . heparin  5,000 Units Subcutaneous Q8H  . latanoprost  1  drop Both Eyes QHS  . levothyroxine  88 mcg Oral QAC breakfast  . mometasone-formoterol  2 puff Inhalation BID  . mometasone-formoterol      . Pancrelipase (Lip-Prot-Amyl)  2 capsule Oral BID AC  . pantoprazole  40 mg Oral Daily  . predniSONE  5 mg Oral Daily  . verapamil  120 mg Oral Daily    Continuous Inpatient Infusions:   . dextrose 5 % and 0.9 % NaCl with KCl 40 mEq/L 75 mL/hr at 11/02/19 0402    PRN Inpatient Medications:  acetaminophen **OR** acetaminophen, ipratropium-albuterol, lipase/protease/amylase, ondansetron **OR** ondansetron (ZOFRAN) IV, polyethylene glycol  Family History: family history includes Asthma in her mother; Diabetes in her mother and another family member; Hypertension in her sister; Pancreatic cancer in her brother; Stroke in her mother.    Social History:   reports that she quit smoking about 24 years ago. Her smoking use included cigarettes. She has a 0.80 pack-year smoking history. She has never used smokeless tobacco. She reports previous alcohol use. She reports that she does not use drugs.   Review of Systems: Constitutional: + severe weight loss   Eyes: No changes in vision. ENT: No oral lesions, sore throat.  GI: see HPI.  Heme/Lymph: No easy bruising.  CV: No  chest pain.  GU: No hematuria.  Integumentary: No rashes.  Neuro: No headaches.  Psych: No depression/anxiety.  Endocrine: No heat/cold intolerance.  Allergic/Immunologic: No urticaria.  Resp: No cough, SOB.  Musculoskeletal: No joint swelling.    Physical Examination: BP 95/67 (BP Location: Left Arm)   Pulse 69   Temp 97.8 F (36.6 C) (Oral)   Resp 15   Ht 5\' 8"  (1.727 m)   Wt 69.6 kg   SpO2 97%   BMI 23.33 kg/m  Gen: NAD, oriented x 4. Able to accurately tell me year. Falls asleep multiple times  HEENT: PEERLA, EOMI, Neck: supple, no JVD or thyromegaly Chest: CTA bilaterally, no wheezes, crackles, or other adventitious sounds CV: RRR, no m/g/c/r Abd: soft, TTP diffusely abd, ND, +BS in all four quadrants; no HSM, guarding, ridigity, or rebound tenderness Ext: no edema, well perfused with 2+ pulses, Skin: no rash or lesions noted Lymph: no LAD  Data: Lab Results  Component Value Date   WBC 3.4 (L) 11/01/2019   HGB 10.0 (L) 11/01/2019   HCT 30.9 (L) 11/01/2019   MCV 88.5 11/01/2019   PLT 308 11/01/2019   Recent Labs  Lab 11/01/19 1525  HGB 10.0*   Lab Results  Component Value Date   NA 137 11/02/2019   K 3.5 11/02/2019   CL 96 (L) 11/02/2019   CO2 33 (H) 11/02/2019   BUN 28 (H) 11/02/2019   CREATININE 1.26 (H) 11/02/2019   Lab Results  Component Value Date   ALT 11 11/01/2019   AST 19 11/01/2019   ALKPHOS 61 11/01/2019   BILITOT 0.6 11/01/2019   No results for input(s): APTT, INR, PTT in the last 168 hours.  IMPRESSION: CT 09/2019 1. No left kidney lesion identified to correspond to the ultrasound abnormality 2. Several scattered cystic structures within the head and body of pancreas are identified. The largest of these measures 0.9 cm. Follow-up imaging in 24 months with pancreas protocol abdominal MRI without and with contrast material is advised. This recommendation follows ACR consensus guidelines: Management of Incidental Pancreatic Cysts: A  White Paper of the ACR Incidental Findings Committee. J Am Coll Radiol 0258;52:778-242. 3. Hepatic steatosis. 4. Gallstones. 5. Changes of chronic  interstitial lung disease.  Last EGD 05/19/19 with severe reflux esophagitis, medium sized hiatal hernia, gastric erosions s/p biopsied.No dilation performed.Pathology with mild chronic gastritis, negative for H. Pylori.  Last colonoscopy in 2013 with recommendations to repeat in 10 years   10/11/2019-negative C diff and GI PCR   IMPRESSION: July 2020 Continued accumulation of radiotracer after CCK administration could be due to biliary dyskinesia, opioid use or chronic cholecystitis.    Assessment/Plan: Ms. Apsey is a 69 y.o. female admitted for hypoglycemia who has had a chronic history nausea vomiting abdominal pain-ongoing per chart review for several years.  She had a HIDA scan in July 2020 as above.  EGD 2021 as above with severe esophagatitis.  She reports continuing to lose weight with daily vomiting and albumin 1.8   Has pancreatic enzyme replacement and Reglan on her medication list but unsure if she is taking these. Will plan for IV reglan while inpatient. DDx would include gastroparesis, biliary etiology.  Dr. Laural Golden to see patient with further recommendations.    Case was discussed with Dr. Laural Golden. Thank you for the consult. Please call with questions or concerns.  Laurine Blazer, PA-C [facility]

## 2019-11-02 NOTE — Telephone Encounter (Signed)
FYI [place], was going to f.u with pt as directed. Pt is currently admitted.

## 2019-11-02 NOTE — Care Management Obs Status (Signed)
MEDICARE OBSERVATION STATUS NOTIFICATION   Patient Details  Name: [name] MRN: [id number] Date of Birth: 1951-01-25   Medicare Observation Status Notification Given:  Yes    Tommy Medal 11/02/2019, 4:23 PM

## 2019-11-02 NOTE — Telephone Encounter (Signed)
Noted  

## 2019-11-03 ENCOUNTER — Inpatient Hospital Stay (HOSPITAL_COMMUNITY): Payer: Medicare Other

## 2019-11-03 DIAGNOSIS — K869 Disease of pancreas, unspecified: Secondary | ICD-10-CM

## 2019-11-03 DIAGNOSIS — R1310 Dysphagia, unspecified: Secondary | ICD-10-CM

## 2019-11-03 DIAGNOSIS — R634 Abnormal weight loss: Secondary | ICD-10-CM

## 2019-11-03 LAB — GLUCOSE, CAPILLARY
Glucose-Capillary: 108 mg/dL — ABNORMAL HIGH (ref 70–99)
Glucose-Capillary: 130 mg/dL — ABNORMAL HIGH (ref 70–99)
Glucose-Capillary: 139 mg/dL — ABNORMAL HIGH (ref 70–99)
Glucose-Capillary: 143 mg/dL — ABNORMAL HIGH (ref 70–99)
Glucose-Capillary: 144 mg/dL — ABNORMAL HIGH (ref 70–99)
Glucose-Capillary: 17 mg/dL — CL (ref 70–99)
Glucose-Capillary: 27 mg/dL — CL (ref 70–99)
Glucose-Capillary: 46 mg/dL — ABNORMAL LOW (ref 70–99)
Glucose-Capillary: 83 mg/dL (ref 70–99)
Glucose-Capillary: 98 mg/dL (ref 70–99)
Glucose-Capillary: <10 mg/dL — CL (ref 70–99)
Glucose-Capillary: <10 mg/dL — CL (ref 70–99)

## 2019-11-03 LAB — COMPREHENSIVE METABOLIC PANEL
ALT: 10 U/L (ref 0–44)
AST: 16 U/L (ref 15–41)
Albumin: 1.6 g/dL — ABNORMAL LOW (ref 3.5–5.0)
Alkaline Phosphatase: 54 U/L (ref 38–126)
Anion gap: 9 (ref 5–15)
BUN: 24 mg/dL — ABNORMAL HIGH (ref 8–23)
CO2: 31 mmol/L (ref 22–32)
Calcium: 7.7 mg/dL — ABNORMAL LOW (ref 8.9–10.3)
Chloride: 99 mmol/L (ref 98–111)
Creatinine, Ser: 1.23 mg/dL — ABNORMAL HIGH (ref 0.44–1.00)
GFR calc Af Amer: 51 mL/min — ABNORMAL LOW (ref >60)
GFR calc non Af Amer: 44 mL/min — ABNORMAL LOW (ref >60)
Glucose, Bld: 161 mg/dL — ABNORMAL HIGH (ref 70–99)
Potassium: 3.6 mmol/L (ref 3.5–5.1)
Sodium: 139 mmol/L (ref 135–145)
Total Bilirubin: 0.5 mg/dL (ref 0.3–1.2)
Total Protein: 5.4 g/dL — ABNORMAL LOW (ref 6.5–8.1)

## 2019-11-03 LAB — CBC
HCT: 30.7 % — ABNORMAL LOW (ref 36.0–46.0)
Hemoglobin: 10.2 g/dL — ABNORMAL LOW (ref 12.0–15.0)
MCH: 29 pg (ref 26.0–34.0)
MCHC: 33.2 g/dL (ref 30.0–36.0)
MCV: 87.2 fL (ref 80.0–100.0)
Platelets: 290 10*3/uL (ref 150–400)
RBC: 3.52 MIL/uL — ABNORMAL LOW (ref 3.87–5.11)
RDW: 17 % — ABNORMAL HIGH (ref 11.5–15.5)
WBC: 5.6 10*3/uL (ref 4.0–10.5)
nRBC: 0 % (ref 0.0–0.2)

## 2019-11-03 LAB — MAGNESIUM: Magnesium: 2.4 mg/dL (ref 1.7–2.4)

## 2019-11-03 MED ORDER — DEXTROSE 50 % IV SOLN
INTRAVENOUS | Status: AC
  Filled 2019-11-03: qty 50

## 2019-11-03 MED ORDER — GLUCAGON HCL RDNA (DIAGNOSTIC) 1 MG IJ SOLR: 1.0000 mg | INTRAMUSCULAR | Status: AC

## 2019-11-03 MED ORDER — GADOBUTROL 1 MMOL/ML IV SOLN
7.0000 mL | Freq: Once | INTRAVENOUS | Status: AC | PRN
  Administered 2019-11-03: 7 mL via INTRAVENOUS

## 2019-11-03 MED ORDER — GLUCAGON HCL RDNA (DIAGNOSTIC) 1 MG IJ SOLR
INTRAMUSCULAR | Status: AC
  Administered 2019-11-03: 1 mg
  Filled 2019-11-03: qty 1

## 2019-11-03 MED ORDER — DEXTROSE 50 % IV SOLN
1.0000 | Freq: Once | INTRAVENOUS | Status: AC
  Administered 2019-11-03: 50 mL via INTRAVENOUS

## 2019-11-03 MED ORDER — KCL IN DEXTROSE-NACL 20-5-0.9 MEQ/L-%-% IV SOLN: INTRAVENOUS | Status: DC

## 2019-11-03 MED ORDER — DEXTROSE-NACL 5-0.45 % IV SOLN: INTRAVENOUS | Status: AC

## 2019-11-03 NOTE — Progress Notes (Signed)
Dextrose 50% 1 ampule given per standing orders for CBG 46, MD aware. Pt asymptomatic.

## 2019-11-03 NOTE — Progress Notes (Signed)
PROGRESS NOTE    [name]  UDJ:[id number] DOB: [birth year] DOA: 11/01/2019 PCP: Maryruth Hancock, MD    Brief Narrative:  HPI: [name] is a 69 y.o. female with medical history significant for COPD asthma with chronic respiratory failure on 3 L, paroxysmal atrial fibrillation, HOCM,liver cirrhosis, diabetes mellitus. Patient was sent to the ED via EMS from her endocrinologist's office with blood sugar of 32, she was given D50 and blood sugar increased to 47, but on arrival to the ED, her blood sugar was 20. Over the past 3 months she has been vomiting every day, at least 2-3 times a day, she has been vomiting almost every thing she tries to eat, with resulted in weight loss.  She reports chronic intermittent abdominal pain.  But she has no abdominal pain today.  No loose stools.  Patient is not on any medications for her diabetes.  She has chronic low white extremity swelling that that fluctuates but is currently unchanged, no chest pain, no difficulty breathing.   Assessment & Plan:   Principal Problem:   Hypoglycemia Active Problems:   Type 2 diabetes mellitus without complication, without long-term current use of insulin (HCC)   Essential hypertension   Asthma, chronic   Chronic respiratory failure with hypoxia (HCC)   ILD (interstitial lung disease) (HCC)   Hypothyroidism   Hepatic cirrhosis (HCC)   Paroxysmal atrial fibrillation (HCC)   Loss of weight   1. Hypoglycemia.  Discussed at length with patient's endocrinologist, Dr. Dorris Fetch.  Felt that hypoglycemia is related to severe malnutrition and depleted glycogen stores.  Recommendations were to keep the patient on dextrose infusion while in the hospital.  Also, keep gloves on patient's hands to help keep them warm.  This may improve accuracy of CBG.  He recommended holding off on further work-up for insulinoma in current setting since results may be skewed.  This will be further followed up as an  outpatient 2. Intractable, nausea and vomiting.  Appears to be a chronic issue.  Associated with substantial weight loss.  GI following and assisting with further work-up.  Both possible related to gastroparesis versus severe esophagitis.  Patient may need artificial nutrition through jejunal feeds.  Discussed with Dr. Dorris Fetch who also felt strongly that she will need tube feeds. 3. Acute kidney injury.  Likely related to decreased p.o. intake/vomiting.  Improving with IV hydration. 4. Diabetes.  She is on any medications.  Currently having episodes of hypoglycemia. 5. Hypertension.  Stable on carvedilol and verapamil 6. Hypothyroidism.  TSH of 10.5.  Continue home dose of Synthroid. 7. COPD/chronic respiratory failure/interstitial lung disease.  Follows with pulmonology.  Continue home dose of prednisone and supplemental oxygen. 8. Scleroderma. On chronic prednisone therapy.  9. History of liver cirrhosis and diastolic heart failure.  Patient has significant lower extremity pitting edema, believe this may be related to hypoalbuminemia.  Holding off on Lasix in light of acute kidney injury. 10. Goals of care.  Discussed CODE STATUS and the possibility of feeding tubes with the patient.  She clearly stated that she would not want any artificial feeding or heroic measures such as CPR/intubation.  We will need to have further discussions regarding temporary nature of tube feeding.   DVT prophylaxis: heparin injection 5,000 Units Start: 11/01/19 2230  Code Status: DNR Family Communication: Discussed with patient Disposition Plan: Status is: Inpatient  Remains inpatient appropriate because:Persistent severe electrolyte disturbances and IV treatments appropriate due to intensity of illness or inability to take  PO   Dispo: The patient is from: Home              Anticipated d/c is to: Home              Anticipated d/c date is: > 3 days              Patient currently is not medically stable to  d/c.   Consultants:   Gastroenterology  Procedures:     Antimicrobials:       Subjective: Still has nausea and vomiting. No abdominal pain. Last BM yesterday  Objective: Vitals:   11/02/19 2025 11/03/19 0552 11/03/19 0757 11/03/19 1342  BP: 126/81 110/87  96/64  Pulse: 74 71  68  Resp: 15 14  17   Temp: 98 F (36.7 C) 98.4 F (36.9 C)  98.9 F (37.2 C)  TempSrc: Oral Oral  Oral  SpO2:  95% 100%   Weight:      Height:        Intake/Output Summary (Last 24 hours) at 11/03/2019 1917 Last data filed at 11/03/2019 1700 Gross per 24 hour  Intake 1185 ml  Output --  Net 1185 ml   Filed Weights   11/01/19 1448 11/01/19 2118  Weight: 68.9 kg 69.6 kg    Examination:  General exam: Alert, awake, oriented x 3 Respiratory system: Clear to auscultation. Respiratory effort normal. Cardiovascular system:RRR. No murmurs, rubs, gallops. Gastrointestinal system: Abdomen is nondistended, soft and nontender. No organomegaly or masses felt. Normal bowel sounds heard. Central nervous system: Alert and oriented. No focal neurological deficits. Extremities: 1-2+ edema  Skin: No rashes, lesions or ulcers Psychiatry: Judgement and insight appear normal. Mood & affect appropriate.   Data Reviewed: I have personally reviewed following labs and imaging studies  CBC: Recent Labs  Lab 11/01/19 1525 11/03/19 0445  WBC 3.4* 5.6  NEUTROABS 2.5  --   HGB 10.0* 10.2*  HCT 30.9* 30.7*  MCV 88.5 87.2  PLT 308 299   Basic Metabolic Panel: Recent Labs  Lab 11/01/19 1525 11/02/19 0458 11/02/19 0807 11/03/19 0445  NA 136 138 137 139  K 3.2* 3.2* 3.5 3.6  CL 96* 95* 96* 99  CO2 30 32 33* 31  GLUCOSE 203* 135* 192* 161*  BUN 30* 29* 28* 24*  CREATININE 1.32* 1.28* 1.26* 1.23*  CALCIUM 7.5* 7.8* 7.8* 7.7*  MG  --   --   --  2.4   GFR: Estimated Creatinine Clearance: 42.9 mL/min (A) (by C-G formula based on SCr of 1.23 mg/dL (H)). Liver Function Tests: Recent Labs  Lab  11/01/19 1525 11/03/19 0445  AST 19 16  ALT 11 10  ALKPHOS 61 54  BILITOT 0.6 0.5  PROT 5.8* 5.4*  ALBUMIN 1.8* 1.6*   Recent Labs  Lab 11/01/19 1525  LIPASE 22   No results for input(s): AMMONIA in the last 168 hours. Coagulation Profile: No results for input(s): INR, PROTIME in the last 168 hours. Cardiac Enzymes: No results for input(s): CKTOTAL, CKMB, CKMBINDEX, TROPONINI in the last 168 hours. BNP (last 3 results) No results for input(s): PROBNP in the last 8760 hours. HbA1C: Recent Labs    11/01/19 1322  HGBA1C 5.0   CBG: Recent Labs  Lab 11/03/19 0347 11/03/19 0728 11/03/19 1010 11/03/19 1112 11/03/19 1607  GLUCAP 98 46* 83 143* 144*   Lipid Profile: No results for input(s): CHOL, HDL, LDLCALC, TRIG, CHOLHDL, LDLDIRECT in the last 72 hours. Thyroid Function Tests: Recent Labs    11/01/19  1525  TSH 10.589*   Anemia Panel: No results for input(s): VITAMINB12, FOLATE, FERRITIN, TIBC, IRON, RETICCTPCT in the last 72 hours. Sepsis Labs: No results for input(s): PROCALCITON, LATICACIDVEN in the last 168 hours.  Recent Results (from the past 240 hour(s))  SARS Coronavirus 2 by RT PCR (hospital order, performed in [facility] hospital lab) Nasopharyngeal Nasopharyngeal Swab     Status: None   Collection Time: 11/01/19  3:16 PM   Specimen: Nasopharyngeal Swab  Result Value Ref Range Status   SARS Coronavirus 2 NEGATIVE NEGATIVE Final    Comment: (NOTE) SARS-CoV-2 target nucleic acids are NOT DETECTED.  The SARS-CoV-2 RNA is generally detectable in upper and lower respiratory specimens during the acute phase of infection. The lowest concentration of SARS-CoV-2 viral copies this assay can detect is 250 copies / mL. A negative result does not preclude SARS-CoV-2 infection and should not be used as the sole basis for treatment or other patient management decisions.  A negative result may occur with improper specimen collection / handling, submission of  specimen other than nasopharyngeal swab, presence of viral mutation(s) within the areas targeted by this assay, and inadequate number of viral copies (<250 copies / mL). A negative result must be combined with clinical observations, patient history, and epidemiological information.  Fact Sheet for Patients:   StrictlyIdeas.no  Fact Sheet for Healthcare Providers: BankingDealers.co.za  This test is not yet approved or  cleared by the [place] FDA and has been authorized for detection and/or diagnosis of SARS-CoV-2 by FDA under an Emergency Use Authorization (EUA).  This EUA will remain in effect (meaning this test can be used) for the duration of the COVID-19 declaration under Section 564(b)(1) of the Act, 21 U.S.C. section 360bbb-3(b)(1), unless the authorization is terminated or revoked sooner.  Performed at [facility], [address], [place],  [address]          Radiology Studies: MR 3D Recon At Scanner  Result Date: 11/03/2019 CLINICAL DATA:  Cystic lesions in the pancreas seen on CT scan. EXAM: MRI ABDOMEN WITHOUT AND WITH CONTRAST (INCLUDING MRCP) TECHNIQUE: Multiplanar multisequence MR imaging of the abdomen was performed both before and after the administration of intravenous contrast. Heavily T2-weighted images of the biliary and pancreatic ducts were obtained, and three-dimensional MRCP images were rendered by post processing. CONTRAST:  74mL GADAVIST GADOBUTROL 1 MMOL/ML IV SOLN COMPARISON:  Abdominal CT 09/19/2019 FINDINGS: Markedly motion degraded study. Lower chest: Probable small right pleural effusion. Hepatobiliary: No gross abnormality within the liver parenchyma. Assessment of liver parenchyma on postcontrast imaging is motion degraded. 6 mm probable gallstone noted in the dependent gallbladder lumen. Common bile duct measures 5 mm diameter in the head of pancreas. Pancreas: 7 mm T2 hyperintense focus  identified in the head of pancreas, similar to recent CT. This lesion is obscured by motion artifact on postcontrast imaging. The remaining tiny lesions seen in the pancreas on previous CT are also obscured. No dilatation of the main duct. Spleen:  No gross abnormality. Adrenals/Urinary Tract: No gross enhancing adrenal or renal mass lesion. No evidence for hydronephrosis. 13 mm probable cyst upper pole right kidney with scattered tiny T2 hyperintensities in both kidneys too small to characterize but likely benign. Stomach/Bowel: Nondilated. Diffuse distension of fluid-filled bowel seen throughout the abdomen. Small bowel loops measure up to 3.9 cm diameter. Vascular/Lymphatic: No abdominal aortic aneurysm. No discernible lymphadenopathy in the abdomen. Other:  No substantial intraperitoneal free fluid. Musculoskeletal: No suspicious marrow enhancement within the visualized  bony anatomy. IMPRESSION: 1. Markedly motion degraded study. Exam is nondiagnostic for small lesions in the solid abdominal organs. 2. 7 mm T2 hyperintense focus in the head of pancreas, similar to recent CT. This lesion is obscured on postcontrast imaging. The remaining tiny lesions seen in the pancreas on previous CT are also obscured by motion artifact. Given patient inability to reproducibly breath hold and remain still, using CT with its better temporal resolution for follow-up is recommended. For cystic lesions of this size in a patient of this age, consensus guidelines recommend repeat imaging every 2 years. This recommendation follows ACR consensus guidelines: Management of Incidental Pancreatic Cysts: A White Paper of the ACR Incidental Findings Committee. [place] 4536;46:803-212. 3. Cholelithiasis. No biliary dilatation. 4. Diffuse distension of fluid-filled bowel loops in the abdomen. Small bowel obstruction cannot be excluded. Electronically Signed   By: Misty Stanley M.D.   On: 11/03/2019 09:23   MR ABDOMEN MRCP W WO  CONTAST  Result Date: 11/03/2019 CLINICAL DATA:  Cystic lesions in the pancreas seen on CT scan. EXAM: MRI ABDOMEN WITHOUT AND WITH CONTRAST (INCLUDING MRCP) TECHNIQUE: Multiplanar multisequence MR imaging of the abdomen was performed both before and after the administration of intravenous contrast. Heavily T2-weighted images of the biliary and pancreatic ducts were obtained, and three-dimensional MRCP images were rendered by post processing. CONTRAST:  50mL GADAVIST GADOBUTROL 1 MMOL/ML IV SOLN COMPARISON:  Abdominal CT 09/19/2019 FINDINGS: Markedly motion degraded study. Lower chest: Probable small right pleural effusion. Hepatobiliary: No gross abnormality within the liver parenchyma. Assessment of liver parenchyma on postcontrast imaging is motion degraded. 6 mm probable gallstone noted in the dependent gallbladder lumen. Common bile duct measures 5 mm diameter in the head of pancreas. Pancreas: 7 mm T2 hyperintense focus identified in the head of pancreas, similar to recent CT. This lesion is obscured by motion artifact on postcontrast imaging. The remaining tiny lesions seen in the pancreas on previous CT are also obscured. No dilatation of the main duct. Spleen:  No gross abnormality. Adrenals/Urinary Tract: No gross enhancing adrenal or renal mass lesion. No evidence for hydronephrosis. 13 mm probable cyst upper pole right kidney with scattered tiny T2 hyperintensities in both kidneys too small to characterize but likely benign. Stomach/Bowel: Nondilated. Diffuse distension of fluid-filled bowel seen throughout the abdomen. Small bowel loops measure up to 3.9 cm diameter. Vascular/Lymphatic: No abdominal aortic aneurysm. No discernible lymphadenopathy in the abdomen. Other:  No substantial intraperitoneal free fluid. Musculoskeletal: No suspicious marrow enhancement within the visualized bony anatomy. IMPRESSION: 1. Markedly motion degraded study. Exam is nondiagnostic for small lesions in the solid  abdominal organs. 2. 7 mm T2 hyperintense focus in the head of pancreas, similar to recent CT. This lesion is obscured on postcontrast imaging. The remaining tiny lesions seen in the pancreas on previous CT are also obscured by motion artifact. Given patient inability to reproducibly breath hold and remain still, using CT with its better temporal resolution for follow-up is recommended. For cystic lesions of this size in a patient of this age, consensus guidelines recommend repeat imaging every 2 years. This recommendation follows ACR consensus guidelines: Management of Incidental Pancreatic Cysts: A White Paper of the ACR Incidental Findings Committee. [place] 2482;50:037-048. 3. Cholelithiasis. No biliary dilatation. 4. Diffuse distension of fluid-filled bowel loops in the abdomen. Small bowel obstruction cannot be excluded. Electronically Signed   By: Misty Stanley M.D.   On: 11/03/2019 09:23        Scheduled  Meds: . aspirin EC  81 mg Oral Daily  . carvedilol  25 mg Oral BID  . dextrose  25 g Intravenous STAT  . heparin  5,000 Units Subcutaneous Q8H  . latanoprost  1 drop Both Eyes QHS  . levothyroxine  88 mcg Oral QAC breakfast  . metoCLOPramide (REGLAN) injection  5 mg Intravenous Q6H  . mometasone-formoterol  2 puff Inhalation BID  . pantoprazole (PROTONIX) IV  40 mg Intravenous Q12H  . predniSONE  5 mg Oral Daily  . verapamil  120 mg Oral Daily   Continuous Infusions: . dextrose 5 % and 0.9 % NaCl with KCl 20 mEq/L       LOS: 1 day    Time spent: 62mins    Kathie Dike, MD [facility]   If 7PM-7AM, please contact night-coverage [email or web address]  11/03/2019, 7:17 PM

## 2019-11-03 NOTE — Progress Notes (Addendum)
Subjective:  Just got back from MRI. Never wants to do one again due to noise and how long it lasted. Trying to drink some clear liquids. No vomiting so far. She states she was not able to keep down her dinner completely. Some came back up more than an hour later. Denies abdominal pain at this time. States she is passing gas.   Objective: Vital signs in last 24 hours: Temp:  [97.6 F (36.4 C)-98.4 F (36.9 C)] 98.4 F (36.9 C) (06/24 0552) Pulse Rate:  [69-74] 71 (06/24 0552) Resp:  [14-17] 14 (06/24 0552) BP: (102-126)/(71-87) 110/87 (06/24 0552) SpO2:  [95 %-100 %] 100 % (06/24 0757) Last BM Date: 10/30/19 General:   Alert,  Well-developed, well-nourished, pleasant and cooperative in NAD Head:  Normocephalic and atraumatic. Eyes:  Sclera clear, no icterus.  Abdomen:  Soft, nontender and nondistended. Normal bowel sounds, without guarding, and without rebound.   Extremities:  Without clubbing, deformity or edema. Neurologic:  Alert and  oriented x4;  grossly normal neurologically. Skin:  Intact without significant lesions or rashes. Psych:  Alert and cooperative. Normal mood and affect.  Intake/Output from previous day: 06/23 0701 - 06/24 0700 In: 1613.7 [P.O.:600; I.V.:1013.7] Out: -  Intake/Output this shift: No intake/output data recorded.  Lab Results: CBC Recent Labs    11/01/19 1525 11/03/19 0445  WBC 3.4* 5.6  HGB 10.0* 10.2*  HCT 30.9* 30.7*  MCV 88.5 87.2  PLT 308 290   BMET Recent Labs    11/02/19 0458 11/02/19 0807 11/03/19 0445  NA 138 137 139  K 3.2* 3.5 3.6  CL 95* 96* 99  CO2 32 33* 31  GLUCOSE 135* 192* 161*  BUN 29* 28* 24*  CREATININE 1.28* 1.26* 1.23*  CALCIUM 7.8* 7.8* 7.7*   LFTs Recent Labs    11/01/19 1525 11/03/19 0445  BILITOT 0.6 0.5  ALKPHOS 61 54  AST 19 16  ALT 11 10  PROT 5.8* 5.4*  ALBUMIN 1.8* 1.6*   Recent Labs    11/01/19 1525  LIPASE 22   PT/INR No results for input(s): LABPROT, INR in the last 72 hours.     Imaging Studies: No results found.[2 weeks]  Lab Results  Component Value Date   IRON 19 (L) 12/28/2018   TIBC 157 (L) 12/28/2018   FERRITIN 201 12/28/2018   Lab Results  Component Value Date   VITAMINB12 341 12/28/2018   Lab Results  Component Value Date   HGBA1C 5.0 11/01/2019      Assessment: 69 year old female admitted for hypoglycemia with history of chronic nausea/vomiting, abdominal pain, profound weight loss of greater than 150 pounds in the past 2 years (weight stable last 6 months), dysphagia to solids/pills, recent diagnosis of scleroderma.  Patient currently being worked up by Dr. Dorris Fetch, rule out insulinoma.  She has been extensively evaluated for N/V, abd pain and weight loss. HIDA scan in July 2020 with a EF of 0%.  Seen by general surgery who did not recommend cholecystectomy based on history of HOCM and unclear that symptoms due to gallbladder.  Upper GI series in October 2020 revealed severe esophageal dysmotility and mucosal irregularity in the distal esophagus raising possibility of esophagitis. EGD January 2021 with severe reflux esophagitis, medium sized hiatal hernia, gastric erosions with mild chronic gastritis on biopsy.  No H. pylori.  Esophageal dilation not performed.  Last colonoscopy in 2013 with recommendations for repeat in 10 years. Patient recently declined updating colonoscopy in setting of diarrhea and weight loss.  CT abdomen and pelvis May 2021 revealed small cystic structures involving the pancreatic head and body, scheduled for MRI later this next month.  Pending gastric emptying study as well. She has had normal fasting cortisol levels and normal ACTH stimulation test. Recent diagnosis of scleroderma.  Recent C. difficile GDH and pathogen panel negative.  Celiac serologies negative.  DDx includes severe gastroparesis resulting in erosive reflux esophagitis, compounded by esophageal motility disorder secondary to scleroderma. Patient can have massive  weight loss the first few years from onset of scleroderma. Gastrointestinal tract involvement also can include motility issues of the small bowel and colon with associated with SIBO and pancreatic exocrine insufficiency. MRCP/MRI abd today to evaluate abnormal pancreas on recent CT. Marked motion degradation.  Exam nondiagnostic for small lesions in the solid abdominal organs.  7 mm focus in the head of the pancreas, similar in size to recent CT.  Lesion obscured on postcontrast imaging.  Remaining tiny lesions in the pancreas also obscured by motion artifact.  Radiologist recommended CT for future follow-up of these lesions due to patient's inability to reproducibly breath-hold and remained stable.  Consider repeat imaging every 2 years per guidelines.  Cholelithiasis noted but no biliary dilatation.  Diffuse distention of fluid-filled bowel loops in the abdomen, small bowel obstruction cannot be excluded, I suspect more of a functional component from her scleroderma.  Severe malnutrition.  She is profoundly hypoalbuminemic with serum albumin 1.8.  CT reveals tissue edema.  Unless she responds to IV metoclopramide, she may need to have alternate means of feeding such as jejunal feeding. Recently started on pancreatic enzymes as outpatient (empiric) given weight loss and loose stool for possible pancreatic insufficiency. Will discontinue due to dysphagia.   Anemia appears to be due to chronic disease.  She also has leukopenia without obvious etiology.  Plan: IV pantoprazole 40 mg twice daily. IV Reglan 5 mg every 6 hours. She will require follow imaging of pancreas as outlined above. Discontinue pancreatic enzymes given dysphagia and the possibility that capsules become lodged in the esophagus creating more problems. [facility] to [facility] with [facility]oderm[facility].  Clear liquids for now, reassess in am.   [name]. [name] [facility] [phone] 6/24/202112:30  PM  Attending note: MRI nondiagnostic largely secondary to movement.  Agree with follow-up CT at a later date.  We will reassess tomorrow morning.   LOS: 1 day

## 2019-11-03 NOTE — Progress Notes (Addendum)
Hypoglycemic Event  CBG: 28  Treatment: Glucagon IM 1 mg  Symptoms: None  Follow-up CBG: Time:0050 CBG Result:108  Possible Reasons for Event: Vomiting and Inadequate meal intake  Comments/MD notified:M. Kasandra Knudsen NP    Tad Moore

## 2019-11-03 NOTE — Progress Notes (Signed)
4 beats of VT, she was aymptomatic. Dr. Darrick Meigs made aware. New order for labs received. Will continue to monitor.

## 2019-11-04 ENCOUNTER — Encounter (HOSPITAL_COMMUNITY): Admission: RE | Admit: 2019-11-04 | Payer: Medicare Other | Source: Ambulatory Visit

## 2019-11-04 ENCOUNTER — Telehealth: Payer: Self-pay | Admitting: Gastroenterology

## 2019-11-04 DIAGNOSIS — R112 Nausea with vomiting, unspecified: Secondary | ICD-10-CM

## 2019-11-04 DIAGNOSIS — R9389 Abnormal findings on diagnostic imaging of other specified body structures: Secondary | ICD-10-CM | POA: Diagnosis present

## 2019-11-04 DIAGNOSIS — R1110 Vomiting, unspecified: Secondary | ICD-10-CM | POA: Diagnosis present

## 2019-11-04 LAB — GLUCOSE, CAPILLARY
Glucose-Capillary: 119 mg/dL — ABNORMAL HIGH (ref 70–99)
Glucose-Capillary: 117 mg/dL — ABNORMAL HIGH (ref 70–99)
Glucose-Capillary: 111 mg/dL — ABNORMAL HIGH (ref 70–99)
Glucose-Capillary: 113 mg/dL — ABNORMAL HIGH (ref 70–99)
Glucose-Capillary: 145 mg/dL — ABNORMAL HIGH (ref 70–99)
Glucose-Capillary: 132 mg/dL — ABNORMAL HIGH (ref 70–99)

## 2019-11-04 LAB — BASIC METABOLIC PANEL
Anion gap: 7 (ref 5–15)
BUN: 21 mg/dL (ref 8–23)
CO2: 29 mmol/L (ref 22–32)
Calcium: 7.4 mg/dL — ABNORMAL LOW (ref 8.9–10.3)
Chloride: 99 mmol/L (ref 98–111)
Creatinine, Ser: 1.19 mg/dL — ABNORMAL HIGH (ref 0.44–1.00)
GFR calc Af Amer: 54 mL/min — ABNORMAL LOW (ref >60)
GFR calc non Af Amer: 46 mL/min — ABNORMAL LOW (ref >60)
Glucose, Bld: 125 mg/dL — ABNORMAL HIGH (ref 70–99)
Potassium: 3.6 mmol/L (ref 3.5–5.1)
Sodium: 135 mmol/L (ref 135–145)

## 2019-11-04 MED ORDER — FUROSEMIDE 10 MG/ML IJ SOLN
20.0000 mg | Freq: Two times a day (BID) | INTRAMUSCULAR | Status: DC
  Administered 2019-11-04 – 2019-11-06 (×4): 20 mg via INTRAVENOUS
  Filled 2019-11-04 (×4): qty 2

## 2019-11-04 MED ORDER — ALBUMIN HUMAN 25 % IV SOLN
25.0000 g | Freq: Two times a day (BID) | INTRAVENOUS | Status: AC
  Administered 2019-11-04 – 2019-11-06 (×4): 25 g via INTRAVENOUS
  Filled 2019-11-04 (×4): qty 100

## 2019-11-04 NOTE — Progress Notes (Signed)
PROGRESS NOTE    [name]  DUK:[id number] DOB: May 24, 1950 DOA: 11/01/2019 PCP: Maryruth Hancock, MD    Brief Narrative:  HPI: [name] is a 69 y.o. female with medical history significant for COPD asthma with chronic respiratory failure on 3 L, paroxysmal atrial fibrillation, HOCM,liver cirrhosis, diabetes mellitus. Patient was sent to the ED via EMS from her endocrinologist's office with blood sugar of 32, she was given D50 and blood sugar increased to 47, but on arrival to the ED, her blood sugar was 20. Over the past 3 months she has been vomiting every day, at least 2-3 times a day, she has been vomiting almost every thing she tries to eat, with resulted in weight loss.  She reports chronic intermittent abdominal pain.  But she has no abdominal pain today.  No loose stools.  Patient is not on any medications for her diabetes.  She has chronic low white extremity swelling that that fluctuates but is currently unchanged, no chest pain, no difficulty breathing.   Assessment & Plan:   Principal Problem:   Hypoglycemia Active Problems:   Type 2 diabetes mellitus without complication, without long-term current use of insulin (HCC)   Essential hypertension   Asthma, chronic   Chronic respiratory failure with hypoxia (HCC)   ILD (interstitial lung disease) (HCC)   Hypothyroidism   Hepatic cirrhosis (HCC)   Paroxysmal atrial fibrillation (HCC)   Loss of weight   Abnormal CT scan   Intractable vomiting   1. Hypoglycemia.  Discussed at length with patient's endocrinologist, Dr. Dorris Fetch.  Felt that hypoglycemia is related to severe malnutrition and depleted glycogen stores.  Recommendations were to keep the patient on dextrose infusion while in the hospital.  Also, keep gloves on patient's hands to help keep them warm.  This may improve accuracy of CBG.  He recommended holding off on further work-up for insulinoma in current setting since results may be skewed.  This will be  further followed up as an outpatient.  Overall blood sugars are better with dextrose infusion. 2. Intractable, nausea and vomiting.  Appears to be a chronic issue.  Associated with substantial weight loss.  GI following and assisting with further work-up.  Both possible related to gastroparesis versus severe esophagitis.  Patient may need artificial nutrition through jejunal feeds.  Discussed with Dr. Dorris Fetch who also felt strongly that she will need tube feeds.  Currently on Reglan.  May need to have gastric emptying study prior to discharge. 3. Diabetes.  She is not on any medications.  Currently having episodes of hypoglycemia. 4. Hypertension.  Stable on carvedilol and verapamil 5. Hypothyroidism.  TSH of 10.5.  Continue home dose of Synthroid. 6. COPD/chronic respiratory failure/interstitial lung disease.  Follows with pulmonology.  Continue home dose of prednisone and supplemental oxygen. 7. Scleroderma. On chronic prednisone therapy.  8. History of liver cirrhosis and diastolic heart failure.  Patient has significant lower extremity pitting edema, believe this may be related to hypoalbuminemia.  Will start on albumin infusions with Lasix. 9. Goals of care.  Discussed CODE STATUS and patient agreed with DNR status.  Initially, she was against any feeding tube, but may be agreeable if temporary situation.   DVT prophylaxis: heparin injection 5,000 Units Start: 11/01/19 2230  Code Status: DNR Family Communication: Discussed with patient Disposition Plan: Status is: Inpatient  Remains inpatient appropriate because:Persistent severe electrolyte disturbances and IV treatments appropriate due to intensity of illness or inability to take PO   Dispo: The  patient is from: Home              Anticipated d/c is to: Home              Anticipated d/c date is: > 3 days              Patient currently is not medically stable to d/c.   Consultants:   Gastroenterology  Procedures:      Antimicrobials:       Subjective: continues to have nausea and vomiting overnight.  Still on liquids.  Objective: Vitals:   11/04/19 0057 11/04/19 0634 11/04/19 0825 11/04/19 1536  BP: 102/69 93/65  93/70  Pulse: 73 77  74  Resp: 18   16  Temp: 98.7 F (37.1 C) 98.1 F (36.7 C)  98.8 F (37.1 C)  TempSrc: Oral Oral  Oral  SpO2: 96% 100% 99% 100%  Weight:      Height:        Intake/Output Summary (Last 24 hours) at 11/04/2019 1939 Last data filed at 11/04/2019 1639 Gross per 24 hour  Intake 1455.16 ml  Output --  Net 1455.16 ml   Filed Weights   11/01/19 1448 11/01/19 2118  Weight: 68.9 kg 69.6 kg    Examination:  General exam: Alert, awake, oriented x 3 Respiratory system: Clear to auscultation. Respiratory effort normal. Cardiovascular system:RRR. No murmurs, rubs, gallops. Gastrointestinal system: Abdomen is nondistended, soft and nontender. No organomegaly or masses felt. Normal bowel sounds heard. Central nervous system: Alert and oriented. No focal neurological deficits. Extremities: 1-2+ edema bilaterally Skin: No rashes, lesions or ulcers Psychiatry: Judgement and insight appear normal. Mood & affect appropriate.    Data Reviewed: I have personally reviewed following labs and imaging studies  CBC: Recent Labs  Lab 11/01/19 1525 11/03/19 0445  WBC 3.4* 5.6  NEUTROABS 2.5  --   HGB 10.0* 10.2*  HCT 30.9* 30.7*  MCV 88.5 87.2  PLT 308 938   Basic Metabolic Panel: Recent Labs  Lab 11/01/19 1525 11/02/19 0458 11/02/19 0807 11/03/19 0445 11/04/19 0519  NA 136 138 137 139 135  K 3.2* 3.2* 3.5 3.6 3.6  CL 96* 95* 96* 99 99  CO2 30 32 33* 31 29  GLUCOSE 203* 135* 192* 161* 125*  BUN 30* 29* 28* 24* 21  CREATININE 1.32* 1.28* 1.26* 1.23* 1.19*  CALCIUM 7.5* 7.8* 7.8* 7.7* 7.4*  MG  --   --   --  2.4  --    GFR: Estimated Creatinine Clearance: 44.4 mL/min (A) (by C-G formula based on SCr of 1.19 mg/dL (H)). Liver Function  Tests: Recent Labs  Lab 11/01/19 1525 11/03/19 0445  AST 19 16  ALT 11 10  ALKPHOS 61 54  BILITOT 0.6 0.5  PROT 5.8* 5.4*  ALBUMIN 1.8* 1.6*   Recent Labs  Lab 11/01/19 1525  LIPASE 22   No results for input(s): AMMONIA in the last 168 hours. Coagulation Profile: No results for input(s): INR, PROTIME in the last 168 hours. Cardiac Enzymes: No results for input(s): CKTOTAL, CKMB, CKMBINDEX, TROPONINI in the last 168 hours. BNP (last 3 results) No results for input(s): PROBNP in the last 8760 hours. HbA1C: No results for input(s): HGBA1C in the last 72 hours. CBG: Recent Labs  Lab 11/04/19 0244 11/04/19 0634 11/04/19 0829 11/04/19 1223 11/04/19 1612  GLUCAP 119* 117* 111* 113* 145*   Lipid Profile: No results for input(s): CHOL, HDL, LDLCALC, TRIG, CHOLHDL, LDLDIRECT in the last 72 hours. Thyroid Function  Tests: No results for input(s): TSH, T4TOTAL, FREET4, T3FREE, THYROIDAB in the last 72 hours. Anemia Panel: No results for input(s): VITAMINB12, FOLATE, FERRITIN, TIBC, IRON, RETICCTPCT in the last 72 hours. Sepsis Labs: No results for input(s): PROCALCITON, LATICACIDVEN in the last 168 hours.  Recent Results (from the past 240 hour(s))  SARS Coronavirus 2 by RT PCR (hospital order, performed in [facility] hospital lab) Nasopharyngeal Nasopharyngeal Swab     Status: None   Collection Time: 11/01/19  3:16 PM   Specimen: Nasopharyngeal Swab  Result Value Ref Range Status   SARS Coronavirus 2 NEGATIVE NEGATIVE Final    Comment: (NOTE) SARS-CoV-2 target nucleic acids are NOT DETECTED.  The SARS-CoV-2 RNA is generally detectable in upper and lower respiratory specimens during the acute phase of infection. The lowest concentration of SARS-CoV-2 viral copies this assay can detect is 250 copies / mL. A negative result does not preclude SARS-CoV-2 infection and should not be used as the sole basis for treatment or other patient management decisions.  A negative  result may occur with improper specimen collection / handling, submission of specimen other than nasopharyngeal swab, presence of viral mutation(s) within the areas targeted by this assay, and inadequate number of viral copies (<250 copies / mL). A negative result must be combined with clinical observations, patient history, and epidemiological information.  Fact Sheet for Patients:   StrictlyIdeas.no  Fact Sheet for Healthcare Providers: BankingDealers.co.za  This test is not yet approved or  cleared by the [place] FDA and has been authorized for detection and/or diagnosis of SARS-CoV-2 by FDA under an Emergency Use Authorization (EUA).  This EUA will remain in effect (meaning this test can be used) for the duration of the COVID-19 declaration under Section 564(b)(1) of the Act, 21 U.S.C. section 360bbb-3(b)(1), unless the authorization is terminated or revoked sooner.  Performed at [facility], [address], [place], [place] [address]          Radiology Studies: MR 3D Recon At Scanner  Result Date: 11/03/2019 CLINICAL DATA:  Cystic lesions in the pancreas seen on CT scan. EXAM: MRI ABDOMEN WITHOUT AND WITH CONTRAST (INCLUDING MRCP) TECHNIQUE: Multiplanar multisequence MR imaging of the abdomen was performed both before and after the administration of intravenous contrast. Heavily T2-weighted images of the biliary and pancreatic ducts were obtained, and three-dimensional MRCP images were rendered by post processing. CONTRAST:  68mL GADAVIST GADOBUTROL 1 MMOL/ML IV SOLN COMPARISON:  Abdominal CT 09/19/2019 FINDINGS: Markedly motion degraded study. Lower chest: Probable small right pleural effusion. Hepatobiliary: No gross abnormality within the liver parenchyma. Assessment of liver parenchyma on postcontrast imaging is motion degraded. 6 mm probable gallstone noted in the dependent gallbladder lumen. Common bile duct measures 5 mm  diameter in the head of pancreas. Pancreas: 7 mm T2 hyperintense focus identified in the head of pancreas, similar to recent CT. This lesion is obscured by motion artifact on postcontrast imaging. The remaining tiny lesions seen in the pancreas on previous CT are also obscured. No dilatation of the main duct. Spleen:  No gross abnormality. Adrenals/Urinary Tract: No gross enhancing adrenal or renal mass lesion. No evidence for hydronephrosis. 13 mm probable cyst upper pole right kidney with scattered tiny T2 hyperintensities in both kidneys too small to characterize but likely benign. Stomach/Bowel: Nondilated. Diffuse distension of fluid-filled bowel seen throughout the abdomen. Small bowel loops measure up to 3.9 cm diameter. Vascular/Lymphatic: No abdominal aortic aneurysm. No discernible lymphadenopathy in the abdomen. Other:  No substantial intraperitoneal free  fluid. Musculoskeletal: No suspicious marrow enhancement within the visualized bony anatomy. IMPRESSION: 1. Markedly motion degraded study. Exam is nondiagnostic for small lesions in the solid abdominal organs. 2. 7 mm T2 hyperintense focus in the head of pancreas, similar to recent CT. This lesion is obscured on postcontrast imaging. The remaining tiny lesions seen in the pancreas on previous CT are also obscured by motion artifact. Given patient inability to reproducibly breath hold and remain still, using CT with its better temporal resolution for follow-up is recommended. For cystic lesions of this size in a patient of this age, consensus guidelines recommend repeat imaging every 2 years. This recommendation follows ACR consensus guidelines: Management of Incidental Pancreatic Cysts: A White Paper of the ACR Incidental Findings Committee. [place] 7014;10:301-314. 3. Cholelithiasis. No biliary dilatation. 4. Diffuse distension of fluid-filled bowel loops in the abdomen. Small bowel obstruction cannot be excluded. Electronically Signed   By:  Misty Stanley M.D.   On: 11/03/2019 09:23   MR ABDOMEN MRCP W WO CONTAST  Result Date: 11/03/2019 CLINICAL DATA:  Cystic lesions in the pancreas seen on CT scan. EXAM: MRI ABDOMEN WITHOUT AND WITH CONTRAST (INCLUDING MRCP) TECHNIQUE: Multiplanar multisequence MR imaging of the abdomen was performed both before and after the administration of intravenous contrast. Heavily T2-weighted images of the biliary and pancreatic ducts were obtained, and three-dimensional MRCP images were rendered by post processing. CONTRAST:  44mL GADAVIST GADOBUTROL 1 MMOL/ML IV SOLN COMPARISON:  Abdominal CT 09/19/2019 FINDINGS: Markedly motion degraded study. Lower chest: Probable small right pleural effusion. Hepatobiliary: No gross abnormality within the liver parenchyma. Assessment of liver parenchyma on postcontrast imaging is motion degraded. 6 mm probable gallstone noted in the dependent gallbladder lumen. Common bile duct measures 5 mm diameter in the head of pancreas. Pancreas: 7 mm T2 hyperintense focus identified in the head of pancreas, similar to recent CT. This lesion is obscured by motion artifact on postcontrast imaging. The remaining tiny lesions seen in the pancreas on previous CT are also obscured. No dilatation of the main duct. Spleen:  No gross abnormality. Adrenals/Urinary Tract: No gross enhancing adrenal or renal mass lesion. No evidence for hydronephrosis. 13 mm probable cyst upper pole right kidney with scattered tiny T2 hyperintensities in both kidneys too small to characterize but likely benign. Stomach/Bowel: Nondilated. Diffuse distension of fluid-filled bowel seen throughout the abdomen. Small bowel loops measure up to 3.9 cm diameter. Vascular/Lymphatic: No abdominal aortic aneurysm. No discernible lymphadenopathy in the abdomen. Other:  No substantial intraperitoneal free fluid. Musculoskeletal: No suspicious marrow enhancement within the visualized bony anatomy. IMPRESSION: 1. Markedly motion degraded  study. Exam is nondiagnostic for small lesions in the solid abdominal organs. 2. 7 mm T2 hyperintense focus in the head of pancreas, similar to recent CT. This lesion is obscured on postcontrast imaging. The remaining tiny lesions seen in the pancreas on previous CT are also obscured by motion artifact. Given patient inability to reproducibly breath hold and remain still, using CT with its better temporal resolution for follow-up is recommended. For cystic lesions of this size in a patient of this age, consensus guidelines recommend repeat imaging every 2 years. This recommendation follows ACR consensus guidelines: Management of Incidental Pancreatic Cysts: A White Paper of the ACR Incidental Findings Committee. [place] [id number];[id number]. 3. Cholelithiasis. No biliary dilatation. 4. Diffuse distension of fluid-filled bowel loops in the abdomen. Small bowel obstruction cannot be excluded. Electronically Signed   By: Misty Stanley M.D.   On: 11/03/2019  09:23        Scheduled Meds: . aspirin EC  81 mg Oral Daily  . carvedilol  25 mg Oral BID  . heparin  5,000 Units Subcutaneous Q8H  . latanoprost  1 drop Both Eyes QHS  . levothyroxine  88 mcg Oral QAC breakfast  . metoCLOPramide (REGLAN) injection  5 mg Intravenous Q6H  . mometasone-formoterol  2 puff Inhalation BID  . pantoprazole (PROTONIX) IV  40 mg Intravenous Q12H  . predniSONE  5 mg Oral Daily  . verapamil  120 mg Oral Daily   Continuous Infusions: . dextrose 5 % and 0.9 % NaCl with KCl 20 mEq/L 75 mL/hr at 11/04/19 1009     LOS: 2 days    Time spent: 43mins    Kathie Dike, MD [facility]   If 7PM-7AM, please contact night-coverage [email or web address]  11/04/2019, 7:39 PM

## 2019-11-04 NOTE — Telephone Encounter (Signed)
Patient is in hospital. She was on for recall MRI in 09/2020. She has had MRI while inpatient and did not have good quality study due to motion degradation.   Please change NIC to CT Abd (pancreatic protocol) for pancreatic cystic lesions in 09/2020.

## 2019-11-04 NOTE — Progress Notes (Addendum)
Subjective:  Patient had vomiting several times last night, during night. Some left upper chest/side pain which has now resolved. Denies abdominal pain.  Objective: Vital signs in last 24 hours: Temp:  [98.1 F (36.7 C)-98.9 F (37.2 C)] 98.1 F (36.7 C) (06/25 0634) Pulse Rate:  [68-77] 77 (06/25 0634) Resp:  [17-18] 18 (06/25 0057) BP: (93-102)/(64-71) 93/65 (06/25 0634) SpO2:  [90 %-100 %] 100 % (06/25 0634) Last BM Date: 10/30/19 General:   Alert,  Well-developed, well-nourished, pleasant and cooperative in NAD Head:  Normocephalic and atraumatic. Eyes:  Sclera clear, no icterus. . Abdomen:  Soft, nontender and nondistended.   Normal bowel sounds, without guarding, and without rebound.   Extremities:  Trace pedal edema bilaterally. Neurologic:  Alert and  oriented x4;  grossly normal neurologically. Skin:  Intact without significant lesions or rashes. Psych:  Alert and cooperative. Normal mood and affect.  Intake/Output from previous day: 06/24 0701 - 06/25 0700 In: 1653.7 [P.O.:960; I.V.:693.7] Out: -  Intake/Output this shift: No intake/output data recorded.  Lab Results: CBC Recent Labs    11/01/19 1525 11/03/19 0445  WBC 3.4* 5.6  HGB 10.0* 10.2*  HCT 30.9* 30.7*  MCV 88.5 87.2  PLT 308 290   BMET Recent Labs    11/02/19 0807 11/03/19 0445 11/04/19 0519  NA 137 139 135  K 3.5 3.6 3.6  CL 96* 99 99  CO2 33* 31 29  GLUCOSE 192* 161* 125*  BUN 28* 24* 21  CREATININE 1.26* 1.23* 1.19*  CALCIUM 7.8* 7.7* 7.4*   LFTs Recent Labs    11/01/19 1525 11/03/19 0445  BILITOT 0.6 0.5  ALKPHOS 61 54  AST 19 16  ALT 11 10  PROT 5.8* 5.4*  ALBUMIN 1.8* 1.6*   Recent Labs    11/01/19 1525  LIPASE 22   PT/INR No results for input(s): LABPROT, INR in the last 72 hours.    Imaging Studies: MR 3D Recon At Scanner  Result Date: 11/03/2019 CLINICAL DATA:  Cystic lesions in the pancreas seen on CT scan. EXAM: MRI ABDOMEN WITHOUT AND WITH CONTRAST  (INCLUDING MRCP) TECHNIQUE: Multiplanar multisequence MR imaging of the abdomen was performed both before and after the administration of intravenous contrast. Heavily T2-weighted images of the biliary and pancreatic ducts were obtained, and three-dimensional MRCP images were rendered by post processing. CONTRAST:  24mL GADAVIST GADOBUTROL 1 MMOL/ML IV SOLN COMPARISON:  Abdominal CT 09/19/2019 FINDINGS: Markedly motion degraded study. Lower chest: Probable small right pleural effusion. Hepatobiliary: No gross abnormality within the liver parenchyma. Assessment of liver parenchyma on postcontrast imaging is motion degraded. 6 mm probable gallstone noted in the dependent gallbladder lumen. Common bile duct measures 5 mm diameter in the head of pancreas. Pancreas: 7 mm T2 hyperintense focus identified in the head of pancreas, similar to recent CT. This lesion is obscured by motion artifact on postcontrast imaging. The remaining tiny lesions seen in the pancreas on previous CT are also obscured. No dilatation of the main duct. Spleen:  No gross abnormality. Adrenals/Urinary Tract: No gross enhancing adrenal or renal mass lesion. No evidence for hydronephrosis. 13 mm probable cyst upper pole right kidney with scattered tiny T2 hyperintensities in both kidneys too small to characterize but likely benign. Stomach/Bowel: Nondilated. Diffuse distension of fluid-filled bowel seen throughout the abdomen. Small bowel loops measure up to 3.9 cm diameter. Vascular/Lymphatic: No abdominal aortic aneurysm. No discernible lymphadenopathy in the abdomen. Other:  No substantial intraperitoneal free fluid. Musculoskeletal: No suspicious marrow enhancement within the  visualized bony anatomy. IMPRESSION: 1. Markedly motion degraded study. Exam is nondiagnostic for small lesions in the solid abdominal organs. 2. 7 mm T2 hyperintense focus in the head of pancreas, similar to recent CT. This lesion is obscured on postcontrast imaging. The  remaining tiny lesions seen in the pancreas on previous CT are also obscured by motion artifact. Given patient inability to reproducibly breath hold and remain still, using CT with its better temporal resolution for follow-up is recommended. For cystic lesions of this size in a patient of this age, consensus guidelines recommend repeat imaging every 2 years. This recommendation follows ACR consensus guidelines: Management of Incidental Pancreatic Cysts: A White Paper of the ACR Incidental Findings Committee. [place] 9242;68:[id number]. 3. Cholelithiasis. No biliary dilatation. 4. Diffuse distension of fluid-filled bowel loops in the abdomen. Small bowel obstruction cannot be excluded. Electronically Signed   By: Misty Stanley M.D.   On: 11/03/2019 09:23   MR ABDOMEN MRCP W WO CONTAST  Result Date: 11/03/2019 CLINICAL DATA:  Cystic lesions in the pancreas seen on CT scan. EXAM: MRI ABDOMEN WITHOUT AND WITH CONTRAST (INCLUDING MRCP) TECHNIQUE: Multiplanar multisequence MR imaging of the abdomen was performed both before and after the administration of intravenous contrast. Heavily T2-weighted images of the biliary and pancreatic ducts were obtained, and three-dimensional MRCP images were rendered by post processing. CONTRAST:  66mL GADAVIST GADOBUTROL 1 MMOL/ML IV SOLN COMPARISON:  Abdominal CT 09/19/2019 FINDINGS: Markedly motion degraded study. Lower chest: Probable small right pleural effusion. Hepatobiliary: No gross abnormality within the liver parenchyma. Assessment of liver parenchyma on postcontrast imaging is motion degraded. 6 mm probable gallstone noted in the dependent gallbladder lumen. Common bile duct measures 5 mm diameter in the head of pancreas. Pancreas: 7 mm T2 hyperintense focus identified in the head of pancreas, similar to recent CT. This lesion is obscured by motion artifact on postcontrast imaging. The remaining tiny lesions seen in the pancreas on previous CT are also obscured. No  dilatation of the main duct. Spleen:  No gross abnormality. Adrenals/Urinary Tract: No gross enhancing adrenal or renal mass lesion. No evidence for hydronephrosis. 13 mm probable cyst upper pole right kidney with scattered tiny T2 hyperintensities in both kidneys too small to characterize but likely benign. Stomach/Bowel: Nondilated. Diffuse distension of fluid-filled bowel seen throughout the abdomen. Small bowel loops measure up to 3.9 cm diameter. Vascular/Lymphatic: No abdominal aortic aneurysm. No discernible lymphadenopathy in the abdomen. Other:  No substantial intraperitoneal free fluid. Musculoskeletal: No suspicious marrow enhancement within the visualized bony anatomy. IMPRESSION: 1. Markedly motion degraded study. Exam is nondiagnostic for small lesions in the solid abdominal organs. 2. 7 mm T2 hyperintense focus in the head of pancreas, similar to recent CT. This lesion is obscured on postcontrast imaging. The remaining tiny lesions seen in the pancreas on previous CT are also obscured by motion artifact. Given patient inability to reproducibly breath hold and remain still, using CT with its better temporal resolution for follow-up is recommended. For cystic lesions of this size in a patient of this age, consensus guidelines recommend repeat imaging every 2 years. This recommendation follows ACR consensus guidelines: Management of Incidental Pancreatic Cysts: A White Paper of the ACR Incidental Findings Committee. [place] 2297;98:[id number]. 3. Cholelithiasis. No biliary dilatation. 4. Diffuse distension of fluid-filled bowel loops in the abdomen. Small bowel obstruction cannot be excluded. Electronically Signed   By: Misty Stanley M.D.   On: 11/03/2019 09:23  [2 weeks]   Assessment: 69 year old  female admitted with hypoglycemia with history of chronic nausea/vomiting, abdominal pain, profound weight loss of greater than 150 pounds in the past 2 years (weight stable last 6 months), dysphagia  to solids/pills, recent diagnosis of scleroderma.  Patient currently being worked up by Dr. Dorris Fetch to rule out insulinoma.  Differential diagnosis includes severe gastroparesis resulting in erosive reflux esophagitis, compounded by esophageal motility disorder secondary to scleroderma.  Patients can have massive weight loss the first few years of onset of scleroderma.  Gastrointestinal tract involvement also can include motility issues of the small bowel and colon with associated SIBO and pancreatic exocrine insufficiency.  She completed MRCP/MRI abdomen this admission to further evaluate abnormal pancreas seen on recent CT.  Marked motion degradation noted.Exam nondiagnostic for small lesions in the solid abdominal organs.  7 mm focus in the head of the pancreas, similar in size to recent CT.  Lesion obscured on postcontrast imaging.  Remaining tiny lesions in the pancreas also obscured by motion artifact.  Radiologist recommended CT for future follow-up of these lesions due to patient's inability to reproducibly breath-hold and remained stable. Cholelithiasis noted but no biliary dilatation.  Diffuse distention of fluid-filled bowel loops in the abdomen, small bowel obstruction cannot be excluded, I suspect more of a functional component from her scleroderma.  Patient with evidence of severe malnutrition, albumin of 1.8 on admission.  Likely main contributing factor to tissue edema seen on imaging. Minimal peripheral edema at this time, improved. Patient initially declined possibility of jejunal feeding tube but this morning she is agreeable if it is needed.  Anemia appears to be due to chronic disease.  Also with leukopenia without obvious etiology.   Plan: 1. Patient was scheduled for outpatient gastric emptying study for today.  Discussed with Thurmond Butts in nuclear medicine. To obtain good baseline study she needs to be able to be off reglan for 24 hours. Cancelled today's study. Discussed with Dr. Gala Romney, if  patient still here on Monday, we can obtain GES at that time with orders to hold reglan 24 hours before.  2. Patient sounds open to possibility of tube feedings (jejunal) if needed.  3. Add scheduled Zofran for next 48 hours.  4. Consider CT, pancreatic protocol imaging at a later date for surveillance of pancreatic cystic lesions not well seen on MRI (motion degradation). Will repeat in one year.  There will be no GI coverage starting today at 5pm through Monday 6/28 at [place]. Bernarda Caffey [facility] [phone] 6/25/202110:59 AM       LOS: 2 days

## 2019-11-04 NOTE — Care Management Important Message (Signed)
Important Message  Patient Details  Name: [name] MRN: [id number] Date of Birth: 1950/08/06   Medicare Important Message Given:  Yes     Tommy Medal 11/04/2019, 2:50 PM

## 2019-11-04 NOTE — Progress Notes (Signed)
MD notified of pt reported 8/10 pain to Left upper chest/side. Pt stated she has never felt this pain before. Per telemetry HR 76 with 1st degree heart block. Pt alert and responding appropriately. Pt also reports multiple vomiting episodes within the last hour.

## 2019-11-05 LAB — COMPREHENSIVE METABOLIC PANEL
ALT: 11 U/L (ref 0–44)
AST: 13 U/L — ABNORMAL LOW (ref 15–41)
Albumin: 1.9 g/dL — ABNORMAL LOW (ref 3.5–5.0)
Alkaline Phosphatase: 55 U/L (ref 38–126)
Anion gap: 6 (ref 5–15)
BUN: 20 mg/dL (ref 8–23)
CO2: 28 mmol/L (ref 22–32)
Calcium: 7.7 mg/dL — ABNORMAL LOW (ref 8.9–10.3)
Chloride: 101 mmol/L (ref 98–111)
Creatinine, Ser: 1.17 mg/dL — ABNORMAL HIGH (ref 0.44–1.00)
GFR calc Af Amer: 55 mL/min — ABNORMAL LOW (ref >60)
GFR calc non Af Amer: 47 mL/min — ABNORMAL LOW (ref >60)
Glucose, Bld: 129 mg/dL — ABNORMAL HIGH (ref 70–99)
Potassium: 3.8 mmol/L (ref 3.5–5.1)
Sodium: 135 mmol/L (ref 135–145)
Total Bilirubin: 0.6 mg/dL (ref 0.3–1.2)
Total Protein: 5.3 g/dL — ABNORMAL LOW (ref 6.5–8.1)

## 2019-11-05 LAB — GLUCOSE, CAPILLARY
Glucose-Capillary: 113 mg/dL — ABNORMAL HIGH (ref 70–99)
Glucose-Capillary: 114 mg/dL — ABNORMAL HIGH (ref 70–99)
Glucose-Capillary: 128 mg/dL — ABNORMAL HIGH (ref 70–99)
Glucose-Capillary: 133 mg/dL — ABNORMAL HIGH (ref 70–99)
Glucose-Capillary: 133 mg/dL — ABNORMAL HIGH (ref 70–99)
Glucose-Capillary: 147 mg/dL — ABNORMAL HIGH (ref 70–99)
Glucose-Capillary: 165 mg/dL — ABNORMAL HIGH (ref 70–99)

## 2019-11-05 NOTE — Plan of Care (Signed)
  Problem: Education: Goal: Knowledge of General Education information will improve Description: Including pain rating scale, medication(s)/side effects and non-pharmacologic comfort measures Outcome: Progressing   Problem: Clinical Measurements: Goal: Ability to maintain clinical measurements within normal limits will improve Outcome: Progressing Goal: Will remain free from infection Outcome: Progressing   

## 2019-11-05 NOTE — Progress Notes (Signed)
PROGRESS NOTE    [name]  EXH:[id number] DOB: 1951/01/31 DOA: 11/01/2019 PCP: Maryruth Hancock, MD    Brief Narrative:  HPI: [name] is a 69 y.o. female with medical history significant for COPD asthma with chronic respiratory failure on 3 L, paroxysmal atrial fibrillation, HOCM,liver cirrhosis, diabetes mellitus. Patient was sent to the ED via EMS from her endocrinologist's office with blood sugar of 32, she was given D50 and blood sugar increased to 47, but on arrival to the ED, her blood sugar was 20. Over the past 3 months she has been vomiting every day, at least 2-3 times a day, she has been vomiting almost every thing she tries to eat, with resulted in weight loss.  She reports chronic intermittent abdominal pain.  But she has no abdominal pain today.  No loose stools.  Patient is not on any medications for her diabetes.  She has chronic low white extremity swelling that that fluctuates but is currently unchanged, no chest pain, no difficulty breathing.   Assessment & Plan:   Principal Problem:   Hypoglycemia Active Problems:   Type 2 diabetes mellitus without complication, without long-term current use of insulin (HCC)   Essential hypertension   Asthma, chronic   Chronic respiratory failure with hypoxia (HCC)   ILD (interstitial lung disease) (HCC)   Hypothyroidism   Hepatic cirrhosis (HCC)   Paroxysmal atrial fibrillation (HCC)   Loss of weight   Abnormal CT scan   Intractable vomiting   1. Hypoglycemia.  Discussed at length with patient's endocrinologist, Dr. Dorris Fetch.  Felt that hypoglycemia is related to severe malnutrition and depleted glycogen stores.  Recommendations were to keep the patient on dextrose infusion while in the hospital.  Also, keep gloves on patient's hands to help keep them warm.  This may improve accuracy of CBG.  He recommended holding off on further work-up for insulinoma in current setting since results may be skewed.  This will be  further followed up as an outpatient.  Overall blood sugars are better with dextrose infusion. 2. Intractable, nausea and vomiting.  Appears to be a chronic issue.  Associated with substantial weight loss.  GI following and assisting with further work-up.  Both possible related to gastroparesis versus severe esophagitis.  Patient may need artificial nutrition through jejunal feeds.  Discussed with Dr. Dorris Fetch who also felt strongly that she will need tube feeds.  Currently on Reglan.  May need to have gastric emptying study prior to discharge. 3. Diabetes.  She is not on any medications.  Currently having episodes of hypoglycemia. 4. Hypertension.  Stable on carvedilol and verapamil 5. Hypothyroidism.  TSH of 10.5.  Continue home dose of Synthroid. 6. COPD/chronic respiratory failure/interstitial lung disease.  Follows with pulmonology.  Continue home dose of prednisone and supplemental oxygen. 7. Scleroderma. On chronic prednisone therapy.  8. History of liver cirrhosis and diastolic heart failure.  Patient has significant lower extremity pitting edema, believe this may be related to hypoalbuminemia.  Currently on albumin and Lasix 9. Goals of care.  Discussed CODE STATUS and patient agreed with DNR status.  Initially, she was against any feeding tube, but may be agreeable if temporary situation.   DVT prophylaxis: heparin injection 5,000 Units Start: 11/01/19 2230  Code Status: DNR Family Communication: Discussed with patient Disposition Plan: Status is: Inpatient  Remains inpatient appropriate because:Persistent severe electrolyte disturbances and IV treatments appropriate due to intensity of illness or inability to take PO   Dispo: The patient is  from: Home              Anticipated d/c is to: Home              Anticipated d/c date is: > 3 days              Patient currently is not medically stable to d/c.   Consultants:   Gastroenterology  Procedures:     Antimicrobials:        Subjective: Reports ongoing issues with nausea and vomiting on liquids.  She did have some bowel movements earlier today.  No abdominal pain.  Objective: Vitals:   11/05/19 0423 11/05/19 0737 11/05/19 0821 11/05/19 1400  BP: 110/69 117/81  109/70  Pulse: 83 83  70  Resp: 16 17  15   Temp: 98.2 F (36.8 C) 98.6 F (37 C)  97.8 F (36.6 C)  TempSrc: Oral Oral  Oral  SpO2: 94% 100% 98% 100%  Weight:      Height:        Intake/Output Summary (Last 24 hours) at 11/05/2019 1901 Last data filed at 11/05/2019 1851 Gross per 24 hour  Intake 1932.64 ml  Output 600 ml  Net 1332.64 ml   Filed Weights   11/01/19 1448 11/01/19 2118  Weight: 68.9 kg 69.6 kg    Examination:  General exam: Alert, awake, oriented x 3 Respiratory system: Clear to auscultation. Respiratory effort normal. Cardiovascular system:RRR. No murmurs, rubs, gallops. Gastrointestinal system: Abdomen is nondistended, soft and nontender. No organomegaly or masses felt. Normal bowel sounds heard. Central nervous system: Alert and oriented. No focal neurological deficits. Extremities: 1-2+ pitting edema bilaterally Skin: No rashes, lesions or ulcers Psychiatry: Judgement and insight appear normal. Mood & affect appropriate.     Data Reviewed: I have personally reviewed following labs and imaging studies  CBC: Recent Labs  Lab 11/01/19 1525 11/03/19 0445  WBC 3.4* 5.6  NEUTROABS 2.5  --   HGB 10.0* 10.2*  HCT 30.9* 30.7*  MCV 88.5 87.2  PLT 308 629   Basic Metabolic Panel: Recent Labs  Lab 11/02/19 0458 11/02/19 0807 11/03/19 0445 11/04/19 0519 11/05/19 0528  NA 138 137 139 135 135  K 3.2* 3.5 3.6 3.6 3.8  CL 95* 96* 99 99 101  CO2 32 33* 31 29 28   GLUCOSE 135* 192* 161* 125* 129*  BUN 29* 28* 24* 21 20  CREATININE 1.28* 1.26* 1.23* 1.19* 1.17*  CALCIUM 7.8* 7.8* 7.7* 7.4* 7.7*  MG  --   --  2.4  --   --    GFR: Estimated Creatinine Clearance: 45.1 mL/min (A) (by C-G formula based on SCr  of 1.17 mg/dL (H)). Liver Function Tests: Recent Labs  Lab 11/01/19 1525 11/03/19 0445 11/05/19 0528  AST 19 16 13*  ALT 11 10 11   ALKPHOS 61 54 55  BILITOT 0.6 0.5 0.6  PROT 5.8* 5.4* 5.3*  ALBUMIN 1.8* 1.6* 1.9*   Recent Labs  Lab 11/01/19 1525  LIPASE 22   No results for input(s): AMMONIA in the last 168 hours. Coagulation Profile: No results for input(s): INR, PROTIME in the last 168 hours. Cardiac Enzymes: No results for input(s): CKTOTAL, CKMB, CKMBINDEX, TROPONINI in the last 168 hours. BNP (last 3 results) No results for input(s): PROBNP in the last 8760 hours. HbA1C: No results for input(s): HGBA1C in the last 72 hours. CBG: Recent Labs  Lab 11/05/19 0006 11/05/19 0426 11/05/19 0727 11/05/19 1121 11/05/19 1618  GLUCAP 133* 114* 113* 133* 165*  Lipid Profile: No results for input(s): CHOL, HDL, LDLCALC, TRIG, CHOLHDL, LDLDIRECT in the last 72 hours. Thyroid Function Tests: No results for input(s): TSH, T4TOTAL, FREET4, T3FREE, THYROIDAB in the last 72 hours. Anemia Panel: No results for input(s): VITAMINB12, FOLATE, FERRITIN, TIBC, IRON, RETICCTPCT in the last 72 hours. Sepsis Labs: No results for input(s): PROCALCITON, LATICACIDVEN in the last 168 hours.  Recent Results (from the past 240 hour(s))  SARS Coronavirus 2 by RT PCR (hospital order, performed in [facility] hospital lab) Nasopharyngeal Nasopharyngeal Swab     Status: None   Collection Time: 11/01/19  3:16 PM   Specimen: Nasopharyngeal Swab  Result Value Ref Range Status   SARS Coronavirus 2 NEGATIVE NEGATIVE Final    Comment: (NOTE) SARS-CoV-2 target nucleic acids are NOT DETECTED.  The SARS-CoV-2 RNA is generally detectable in upper and lower respiratory specimens during the acute phase of infection. The lowest concentration of SARS-CoV-2 viral copies this assay can detect is 250 copies / mL. A negative result does not preclude SARS-CoV-2 infection and should not be used as the sole  basis for treatment or other patient management decisions.  A negative result may occur with improper specimen collection / handling, submission of specimen other than nasopharyngeal swab, presence of viral mutation(s) within the areas targeted by this assay, and inadequate number of viral copies (<250 copies / mL). A negative result must be combined with clinical observations, patient history, and epidemiological information.  Fact Sheet for Patients:   StrictlyIdeas.no  Fact Sheet for Healthcare Providers: BankingDealers.co.za  This test is not yet approved or  cleared by the [place] FDA and has been authorized for detection and/or diagnosis of SARS-CoV-2 by FDA under an Emergency Use Authorization (EUA).  This EUA will remain in effect (meaning this test can be used) for the duration of the COVID-19 declaration under Section 564(b)(1) of the Act, 21 U.S.C. section 360bbb-3(b)(1), unless the authorization is terminated or revoked sooner.  Performed at [facility], [address] [place], [place] [address]          Radiology Studies: No results found.      Scheduled Meds: . aspirin EC  81 mg Oral Daily  . carvedilol  25 mg Oral BID  . furosemide  20 mg Intravenous BID  . heparin  5,000 Units Subcutaneous Q8H  . latanoprost  1 drop Both Eyes QHS  . levothyroxine  88 mcg Oral QAC breakfast  . metoCLOPramide (REGLAN) injection  5 mg Intravenous Q6H  . mometasone-formoterol  2 puff Inhalation BID  . pantoprazole (PROTONIX) IV  40 mg Intravenous Q12H  . predniSONE  5 mg Oral Daily  . verapamil  120 mg Oral Daily   Continuous Infusions: . albumin human 25 g (11/05/19 1046)  . dextrose 5 % and 0.9 % NaCl with KCl 20 mEq/L 75 mL/hr at 11/04/19 2322     LOS: 3 days    Time spent: 50mins    Kathie Dike, MD [facility]   If 7PM-7AM, please contact night-coverage [email or web address]  11/05/2019, 7:01 PM

## 2019-11-06 LAB — GLUCOSE, RANDOM: Glucose, Bld: 177 mg/dL — ABNORMAL HIGH (ref 70–99)

## 2019-11-06 LAB — COMPREHENSIVE METABOLIC PANEL WITH GFR
ALT: 9 U/L (ref 0–44)
AST: 13 U/L — ABNORMAL LOW (ref 15–41)
Albumin: 2.4 g/dL — ABNORMAL LOW (ref 3.5–5.0)
Alkaline Phosphatase: 56 U/L (ref 38–126)
Anion gap: 7 (ref 5–15)
BUN: 18 mg/dL (ref 8–23)
CO2: 27 mmol/L (ref 22–32)
Calcium: 7.9 mg/dL — ABNORMAL LOW (ref 8.9–10.3)
Chloride: 101 mmol/L (ref 98–111)
Creatinine, Ser: 1.09 mg/dL — ABNORMAL HIGH (ref 0.44–1.00)
GFR calc Af Amer: 60 mL/min — ABNORMAL LOW
GFR calc non Af Amer: 51 mL/min — ABNORMAL LOW
Glucose, Bld: 111 mg/dL — ABNORMAL HIGH (ref 70–99)
Potassium: 4.1 mmol/L (ref 3.5–5.1)
Sodium: 135 mmol/L (ref 135–145)
Total Bilirubin: 0.6 mg/dL (ref 0.3–1.2)
Total Protein: 5.6 g/dL — ABNORMAL LOW (ref 6.5–8.1)

## 2019-11-06 LAB — GLUCOSE, CAPILLARY
Glucose-Capillary: 54 mg/dL — ABNORMAL LOW (ref 70–99)
Glucose-Capillary: 40 mg/dL — CL (ref 70–99)
Glucose-Capillary: 81 mg/dL (ref 70–99)
Glucose-Capillary: 136 mg/dL — ABNORMAL HIGH (ref 70–99)
Glucose-Capillary: 184 mg/dL — ABNORMAL HIGH (ref 70–99)
Glucose-Capillary: 194 mg/dL — ABNORMAL HIGH (ref 70–99)

## 2019-11-06 MED ORDER — DEXTROSE 50 % IV SOLN
INTRAVENOUS | Status: AC
  Administered 2019-11-06: 50 mL
  Filled 2019-11-06: qty 50

## 2019-11-06 MED ORDER — FUROSEMIDE 10 MG/ML IJ SOLN
40.0000 mg | Freq: Two times a day (BID) | INTRAMUSCULAR | Status: DC
  Administered 2019-11-06: 40 mg via INTRAVENOUS
  Filled 2019-11-06: qty 4

## 2019-11-06 MED ORDER — FUROSEMIDE 10 MG/ML IJ SOLN
20.0000 mg | Freq: Two times a day (BID) | INTRAMUSCULAR | Status: DC
  Administered 2019-11-07 – 2019-11-13 (×13): 20 mg via INTRAVENOUS
  Filled 2019-11-06 (×13): qty 2

## 2019-11-06 NOTE — Progress Notes (Signed)
PROGRESS NOTE    [name]  GBT:[id number] DOB: [birth year] DOA: 11/01/2019 PCP: Maryruth Hancock, MD    Brief Narrative:  HPI: [name] is a 69 y.o. female with medical history significant for COPD asthma with chronic respiratory failure on 3 L, paroxysmal atrial fibrillation, HOCM,liver cirrhosis, diabetes mellitus. Patient was sent to the ED via EMS from her endocrinologist's office with blood sugar of 32, she was given D50 and blood sugar increased to 47, but on arrival to the ED, her blood sugar was 20. Over the past 3 months she has been vomiting every day, at least 2-3 times a day, she has been vomiting almost every thing she tries to eat, with resulted in weight loss.  She reports chronic intermittent abdominal pain.  But she has no abdominal pain today.  No loose stools.  Patient is not on any medications for her diabetes.  She has chronic low white extremity swelling that that fluctuates but is currently unchanged, no chest pain, no difficulty breathing.   Assessment & Plan:   Principal Problem:   Hypoglycemia Active Problems:   Type 2 diabetes mellitus without complication, without long-term current use of insulin (HCC)   Essential hypertension   Asthma, chronic   Chronic respiratory failure with hypoxia (HCC)   ILD (interstitial lung disease) (HCC)   Hypothyroidism   Hepatic cirrhosis (HCC)   Paroxysmal atrial fibrillation (HCC)   Loss of weight   Abnormal CT scan   Intractable vomiting   1. Hypoglycemia.  Discussed at length with patient's endocrinologist, Dr. Dorris Fetch.  Felt that hypoglycemia is related to severe malnutrition and depleted glycogen stores.  Recommendations were to keep the patient on dextrose infusion while in the hospital.  Also, keep gloves on patient's hands to help keep them warm.  This may improve accuracy of CBG.  He recommended holding off on further work-up for insulinoma in current setting since results may be skewed.  This will be  further followed up as an outpatient.  Overall blood sugars are better with dextrose infusion. 2. Intractable, nausea and vomiting.  Appears to be a chronic issue.  Associated with substantial weight loss.  GI following and assisting with further work-up.  Both possible related to gastroparesis versus severe esophagitis.  Patient may need artificial nutrition through jejunal feeds.  Discussed with Dr. Dorris Fetch who also felt strongly that she will need tube feeds.  Currently on Reglan.  May need to have gastric emptying study prior to discharge. 3. Diabetes.  She is not on any medications.  Continues to have episodes of hypoglycemia. 4. Hypertension.  Stable on carvedilol and verapamil 5. Hypothyroidism.  TSH of 10.5.  Continue home dose of Synthroid. 6. COPD/chronic respiratory failure/interstitial lung disease.  Follows with pulmonology.  Continue home dose of prednisone and supplemental oxygen. 7. Scleroderma. On chronic prednisone therapy.  8. History of liver cirrhosis and diastolic heart failure.  Overall anasarca is improving with Lasix/albumin 9. Goals of care.  Discussed CODE STATUS and patient agreed with DNR status.  Initially, she was against any feeding tube, but may be agreeable if temporary situation.   DVT prophylaxis: heparin injection 5,000 Units Start: 11/01/19 2230  Code Status: DNR Family Communication: Discussed with patient Disposition Plan: Status is: Inpatient  Remains inpatient appropriate because:Persistent severe electrolyte disturbances and IV treatments appropriate due to intensity of illness or inability to take PO   Dispo: The patient is from: Home  Anticipated d/c is to: Home              Anticipated d/c date is: > 3 days              Patient currently is not medically stable to d/c.   Consultants:   Gastroenterology  Procedures:     Antimicrobials:       Subjective: Has had continued nausea or vomiting.  Feels weak.  No abdominal  pain.  Objective: Vitals:   11/05/19 1953 11/05/19 2007 11/06/19 0426 11/06/19 0812  BP:  (!) 154/100 (!) 145/92   Pulse:  69 90   Resp:  16 16   Temp:  98.2 F (36.8 C) 98.4 F (36.9 C)   TempSrc:  Oral Oral   SpO2: 100%  (!) 58% 92%  Weight:      Height:        Intake/Output Summary (Last 24 hours) at 11/06/2019 1715 Last data filed at 11/06/2019 1500 Gross per 24 hour  Intake 3979.46 ml  Output 2450 ml  Net 1529.46 ml   Filed Weights   11/01/19 1448 11/01/19 2118  Weight: 68.9 kg 69.6 kg    Examination:  General exam: Alert, awake, oriented x 3 Respiratory system: Clear to auscultation. Respiratory effort normal. Cardiovascular system:RRR. No murmurs, rubs, gallops. Gastrointestinal system: Abdomen is nondistended, soft and nontender. No organomegaly or masses felt. Normal bowel sounds heard. Central nervous system: Alert and oriented. No focal neurological deficits. Extremities: 1+ pitting edema bilaterally Skin: No rashes, lesions or ulcers Psychiatry: Judgement and insight appear normal. Mood & affect appropriate.     Data Reviewed: I have personally reviewed following labs and imaging studies  CBC: Recent Labs  Lab 11/01/19 1525 11/03/19 0445  WBC 3.4* 5.6  NEUTROABS 2.5  --   HGB 10.0* 10.2*  HCT 30.9* 30.7*  MCV 88.5 87.2  PLT 308 032   Basic Metabolic Panel: Recent Labs  Lab 11/02/19 0807 11/02/19 0807 11/03/19 0445 11/04/19 0519 11/05/19 0528 11/06/19 0518 11/06/19 0822  NA 137  --  139 135 135 135  --   K 3.5  --  3.6 3.6 3.8 4.1  --   CL 96*  --  99 99 101 101  --   CO2 33*  --  31 29 28 27   --   GLUCOSE 192*   < > 161* 125* 129* 111* 177*  BUN 28*  --  24* 21 20 18   --   CREATININE 1.26*  --  1.23* 1.19* 1.17* 1.09*  --   CALCIUM 7.8*  --  7.7* 7.4* 7.7* 7.9*  --   MG  --   --  2.4  --   --   --   --    < > = values in this interval not displayed.   GFR: Estimated Creatinine Clearance: 48.4 mL/min (A) (by C-G formula based on  SCr of 1.09 mg/dL (H)). Liver Function Tests: Recent Labs  Lab 11/01/19 1525 11/03/19 0445 11/05/19 0528 11/06/19 0518  AST 19 16 13* 13*  ALT 11 10 11 9   ALKPHOS 61 54 55 56  BILITOT 0.6 0.5 0.6 0.6  PROT 5.8* 5.4* 5.3* 5.6*  ALBUMIN 1.8* 1.6* 1.9* 2.4*   Recent Labs  Lab 11/01/19 1525  LIPASE 22   No results for input(s): AMMONIA in the last 168 hours. Coagulation Profile: No results for input(s): INR, PROTIME in the last 168 hours. Cardiac Enzymes: No results for input(s): CKTOTAL, CKMB, CKMBINDEX, TROPONINI in  the last 168 hours. BNP (last 3 results) No results for input(s): PROBNP in the last 8760 hours. HbA1C: No results for input(s): HGBA1C in the last 72 hours. CBG: Recent Labs  Lab 11/06/19 0742 11/06/19 0749 11/06/19 0822 11/06/19 1100 11/06/19 1654  GLUCAP 38* 40* 81 136* 184*   Lipid Profile: No results for input(s): CHOL, HDL, LDLCALC, TRIG, CHOLHDL, LDLDIRECT in the last 72 hours. Thyroid Function Tests: No results for input(s): TSH, T4TOTAL, FREET4, T3FREE, THYROIDAB in the last 72 hours. Anemia Panel: No results for input(s): VITAMINB12, FOLATE, FERRITIN, TIBC, IRON, RETICCTPCT in the last 72 hours. Sepsis Labs: No results for input(s): PROCALCITON, LATICACIDVEN in the last 168 hours.  Recent Results (from the past 240 hour(s))  SARS Coronavirus 2 by RT PCR (hospital order, performed in [facility] hospital lab) Nasopharyngeal Nasopharyngeal Swab     Status: None   Collection Time: 11/01/19  3:16 PM   Specimen: Nasopharyngeal Swab  Result Value Ref Range Status   SARS Coronavirus 2 NEGATIVE NEGATIVE Final    Comment: (NOTE) SARS-CoV-2 target nucleic acids are NOT DETECTED.  The SARS-CoV-2 RNA is generally detectable in upper and lower respiratory specimens during the acute phase of infection. The lowest concentration of SARS-CoV-2 viral copies this assay can detect is 250 copies / mL. A negative result does not preclude SARS-CoV-2  infection and should not be used as the sole basis for treatment or other patient management decisions.  A negative result may occur with improper specimen collection / handling, submission of specimen other than nasopharyngeal swab, presence of viral mutation(s) within the areas targeted by this assay, and inadequate number of viral copies (<250 copies / mL). A negative result must be combined with clinical observations, patient history, and epidemiological information.  Fact Sheet for Patients:   StrictlyIdeas.no  Fact Sheet for Healthcare Providers: BankingDealers.co.za  This test is not yet approved or  cleared by the [place] FDA and has been authorized for detection and/or diagnosis of SARS-CoV-2 by FDA under an Emergency Use Authorization (EUA).  This EUA will remain in effect (meaning this test can be used) for the duration of the COVID-19 declaration under Section 564(b)(1) of the Act, 21 U.S.C. section 360bbb-3(b)(1), unless the authorization is terminated or revoked sooner.  Performed at [facility], [address], [place], [place] [address]          Radiology Studies: No results found.      Scheduled Meds: . aspirin EC  81 mg Oral Daily  . carvedilol  25 mg Oral BID  . furosemide  40 mg Intravenous BID  . heparin  5,000 Units Subcutaneous Q8H  . latanoprost  1 drop Both Eyes QHS  . levothyroxine  88 mcg Oral QAC breakfast  . mometasone-formoterol  2 puff Inhalation BID  . pantoprazole (PROTONIX) IV  40 mg Intravenous Q12H  . predniSONE  5 mg Oral Daily  . verapamil  120 mg Oral Daily   Continuous Infusions: . dextrose 5 % and 0.9 % NaCl with KCl 20 mEq/L 75 mL/hr at 11/06/19 0557     LOS: 4 days    Time spent: 4mins    Kathie Dike, MD [facility]   If 7PM-7AM, please contact night-coverage [email or web address]  11/06/2019, 5:15 PM

## 2019-11-07 ENCOUNTER — Inpatient Hospital Stay (HOSPITAL_COMMUNITY): Payer: Medicare Other

## 2019-11-07 DIAGNOSIS — E162 Hypoglycemia, unspecified: Secondary | ICD-10-CM

## 2019-11-07 LAB — COMPREHENSIVE METABOLIC PANEL
ALT: 9 U/L (ref 0–44)
AST: 11 U/L — ABNORMAL LOW (ref 15–41)
Albumin: 2.2 g/dL — ABNORMAL LOW (ref 3.5–5.0)
Alkaline Phosphatase: 54 U/L (ref 38–126)
Anion gap: 8 (ref 5–15)
BUN: 16 mg/dL (ref 8–23)
CO2: 28 mmol/L (ref 22–32)
Calcium: 7.9 mg/dL — ABNORMAL LOW (ref 8.9–10.3)
Chloride: 100 mmol/L (ref 98–111)
Creatinine, Ser: 1.09 mg/dL — ABNORMAL HIGH (ref 0.44–1.00)
GFR calc Af Amer: 60 mL/min — ABNORMAL LOW (ref >60)
GFR calc non Af Amer: 51 mL/min — ABNORMAL LOW (ref >60)
Glucose, Bld: 129 mg/dL — ABNORMAL HIGH (ref 70–99)
Potassium: 4.2 mmol/L (ref 3.5–5.1)
Sodium: 136 mmol/L (ref 135–145)
Total Bilirubin: 0.7 mg/dL (ref 0.3–1.2)
Total Protein: 5.2 g/dL — ABNORMAL LOW (ref 6.5–8.1)

## 2019-11-07 LAB — CBC
HCT: 28.3 % — ABNORMAL LOW (ref 36.0–46.0)
Hemoglobin: 9.2 g/dL — ABNORMAL LOW (ref 12.0–15.0)
MCH: 28.4 pg (ref 26.0–34.0)
MCHC: 32.5 g/dL (ref 30.0–36.0)
MCV: 87.3 fL (ref 80.0–100.0)
Platelets: 242 10*3/uL (ref 150–400)
RBC: 3.24 MIL/uL — ABNORMAL LOW (ref 3.87–5.11)
RDW: 16.5 % — ABNORMAL HIGH (ref 11.5–15.5)
WBC: 4.2 10*3/uL (ref 4.0–10.5)
nRBC: 0 % (ref 0.0–0.2)

## 2019-11-07 LAB — GLUCOSE, CAPILLARY
Glucose-Capillary: 114 mg/dL — ABNORMAL HIGH (ref 70–99)
Glucose-Capillary: 121 mg/dL — ABNORMAL HIGH (ref 70–99)
Glucose-Capillary: 139 mg/dL — ABNORMAL HIGH (ref 70–99)
Glucose-Capillary: 165 mg/dL — ABNORMAL HIGH (ref 70–99)
Glucose-Capillary: 172 mg/dL — ABNORMAL HIGH (ref 70–99)
Glucose-Capillary: 192 mg/dL — ABNORMAL HIGH (ref 70–99)
Glucose-Capillary: 38 mg/dL — CL (ref 70–99)
Glucose-Capillary: 84 mg/dL (ref 70–99)

## 2019-11-07 NOTE — Progress Notes (Signed)
PROGRESS NOTE    [name]  GDJ:[id number] DOB: 1951/03/24 DOA: 11/01/2019 PCP: Maryruth Hancock, MD    Brief Narrative:  HPI: [name] is a 69 y.o. female with medical history significant for COPD asthma with chronic respiratory failure on 3 L, paroxysmal atrial fibrillation, HOCM,liver cirrhosis, diabetes mellitus. Patient was sent to the ED via EMS from her endocrinologist's office with blood sugar of 32, she was given D50 and blood sugar increased to 47, but on arrival to the ED, her blood sugar was 20. Over the past 3 months she has been vomiting every day, at least 2-3 times a day, she has been vomiting almost every thing she tries to eat, with resulted in weight loss.  She reports chronic intermittent abdominal pain.  But she has no abdominal pain today.  No loose stools.  Patient is not on any medications for her diabetes.  She has chronic lower extremity swelling that that fluctuates but is currently unchanged, no chest pain, no difficulty breathing.   Assessment & Plan:   Principal Problem:   Hypoglycemia Active Problems:   Type 2 diabetes mellitus without complication, without long-term current use of insulin (HCC)   Essential hypertension   Asthma, chronic   Chronic respiratory failure with hypoxia (HCC)   ILD (interstitial lung disease) (HCC)   Hypothyroidism   Hepatic cirrhosis (HCC)   Paroxysmal atrial fibrillation (HCC)   Loss of weight   Abnormal CT scan   Intractable vomiting   1. Hypoglycemia.  Discussed at length with patient's endocrinologist, Dr. Dorris Fetch.  Felt that hypoglycemia is related to severe malnutrition and depleted glycogen stores.  Recommendations were to keep the patient on dextrose infusion while in the hospital.  Also, keep gloves on patient's hands to help keep them warm.  This may improve accuracy of CBG.  He recommended holding off on further work-up for insulinoma in current setting since results may be skewed.  This will be  further followed up as an outpatient.  Overall blood sugars are better with dextrose infusion. 2. Intractable, nausea and vomiting.  Appears to be a chronic issue.  Associated with substantial weight loss.  GI following and assisting with further work-up.  Both possible related to gastroparesis versus severe esophagitis.  Patient may need artificial nutrition through jejunal feeds.  Discussed with Dr. Dorris Fetch who also felt strongly that she will need tube feeds.  Reglan currently on hold.  Plans for gastric emptying study in a.m. 3. Diabetes.  She is not on any medications.  Continues to have episodes of hypoglycemia. 4. Hypertension.  Stable on carvedilol and verapamil 5. Hypothyroidism.  TSH of 10.5.  Continue home dose of Synthroid. 6. COPD/chronic respiratory failure/interstitial lung disease.  Follows with pulmonology.  Continue home dose of prednisone and supplemental oxygen. 7. Scleroderma. On chronic prednisone therapy.  8. History of liver cirrhosis and diastolic heart failure.  Overall anasarca is improving with Lasix/albumin 9. Goals of care.  Discussed CODE STATUS and patient agreed with DNR status.  Initially, she was against any feeding tube, but may be agreeable if temporary situation.   DVT prophylaxis: heparin injection 5,000 Units Start: 11/01/19 2230  Code Status: DNR Family Communication: Discussed with patient Disposition Plan: Status is: Inpatient  Remains inpatient appropriate because:Persistent severe electrolyte disturbances and IV treatments appropriate due to intensity of illness or inability to take PO   Dispo: The patient is from: Home              Anticipated  d/c is to: Home              Anticipated d/c date is: > 3 days              Patient currently is not medically stable to d/c.   Consultants:   Gastroenterology  Procedures:     Antimicrobials:       Subjective: Reports vomiting overnight with p.o. intake.  No abdominal  pain.  Objective: Vitals:   11/06/19 2058 11/07/19 0412 11/07/19 0759 11/07/19 1921  BP: 123/79 113/74    Pulse: 71 81  83  Resp: 16 16  18   Temp: 98.5 F (36.9 C) 98.6 F (37 C)    TempSrc: Oral Oral    SpO2: 100% 97% 96% 100%  Weight:      Height:        Intake/Output Summary (Last 24 hours) at 11/07/2019 1953 Last data filed at 11/07/2019 1800 Gross per 24 hour  Intake 1852.62 ml  Output 3300 ml  Net -1447.38 ml   Filed Weights   11/01/19 1448 11/01/19 2118  Weight: 68.9 kg 69.6 kg    Examination:  General exam: Alert, awake, oriented x 3 Respiratory system: Clear to auscultation. Respiratory effort normal. Cardiovascular system:RRR. No murmurs, rubs, gallops. Gastrointestinal system: Abdomen is nondistended, soft and nontender. No organomegaly or masses felt. Normal bowel sounds heard. Central nervous system: Alert and oriented. No focal neurological deficits. Extremities: 1-2+ edema Skin: No rashes, lesions or ulcers Psychiatry: Judgement and insight appear normal. Mood & affect appropriate.       Data Reviewed: I have personally reviewed following labs and imaging studies  CBC: Recent Labs  Lab 11/01/19 1525 11/03/19 0445 11/07/19 0437  WBC 3.4* 5.6 4.2  NEUTROABS 2.5  --   --   HGB 10.0* 10.2* 9.2*  HCT 30.9* 30.7* 28.3*  MCV 88.5 87.2 87.3  PLT 308 290 382   Basic Metabolic Panel: Recent Labs  Lab 11/03/19 0445 11/03/19 0445 11/04/19 0519 11/05/19 0528 11/06/19 0518 11/06/19 0822 11/07/19 0437  NA 139  --  135 135 135  --  136  K 3.6  --  3.6 3.8 4.1  --  4.2  CL 99  --  99 101 101  --  100  CO2 31  --  29 28 27   --  28  GLUCOSE 161*   < > 125* 129* 111* 177* 129*  BUN 24*  --  21 20 18   --  16  CREATININE 1.23*  --  1.19* 1.17* 1.09*  --  1.09*  CALCIUM 7.7*  --  7.4* 7.7* 7.9*  --  7.9*  MG 2.4  --   --   --   --   --   --    < > = values in this interval not displayed.   GFR: Estimated Creatinine Clearance: 48.4 mL/min (A) (by  C-G formula based on SCr of 1.09 mg/dL (H)). Liver Function Tests: Recent Labs  Lab 11/01/19 1525 11/03/19 0445 11/05/19 0528 11/06/19 0518 11/07/19 0437  AST 19 16 13* 13* 11*  ALT 11 10 11 9 9   ALKPHOS 61 54 55 56 54  BILITOT 0.6 0.5 0.6 0.6 0.7  PROT 5.8* 5.4* 5.3* 5.6* 5.2*  ALBUMIN 1.8* 1.6* 1.9* 2.4* 2.2*   Recent Labs  Lab 11/01/19 1525  LIPASE 22   No results for input(s): AMMONIA in the last 168 hours. Coagulation Profile: No results for input(s): INR, PROTIME in the last 168 hours.  Cardiac Enzymes: No results for input(s): CKTOTAL, CKMB, CKMBINDEX, TROPONINI in the last 168 hours. BNP (last 3 results) No results for input(s): PROBNP in the last 8760 hours. HbA1C: No results for input(s): HGBA1C in the last 72 hours. CBG: Recent Labs  Lab 11/07/19 0009 11/07/19 0410 11/07/19 0745 11/07/19 1108 11/07/19 1620  GLUCAP 139* 84 114* 121* 192*   Lipid Profile: No results for input(s): CHOL, HDL, LDLCALC, TRIG, CHOLHDL, LDLDIRECT in the last 72 hours. Thyroid Function Tests: No results for input(s): TSH, T4TOTAL, FREET4, T3FREE, THYROIDAB in the last 72 hours. Anemia Panel: No results for input(s): VITAMINB12, FOLATE, FERRITIN, TIBC, IRON, RETICCTPCT in the last 72 hours. Sepsis Labs: No results for input(s): PROCALCITON, LATICACIDVEN in the last 168 hours.  Recent Results (from the past 240 hour(s))  SARS Coronavirus 2 by RT PCR (hospital order, performed in [facility] hospital lab) Nasopharyngeal Nasopharyngeal Swab     Status: None   Collection Time: 11/01/19  3:16 PM   Specimen: Nasopharyngeal Swab  Result Value Ref Range Status   SARS Coronavirus 2 NEGATIVE NEGATIVE Final    Comment: (NOTE) SARS-CoV-2 target nucleic acids are NOT DETECTED.  The SARS-CoV-2 RNA is generally detectable in upper and lower respiratory specimens during the acute phase of infection. The lowest concentration of SARS-CoV-2 viral copies this assay can detect is 250 copies /  mL. A negative result does not preclude SARS-CoV-2 infection and should not be used as the sole basis for treatment or other patient management decisions.  A negative result may occur with improper specimen collection / handling, submission of specimen other than nasopharyngeal swab, presence of viral mutation(s) within the areas targeted by this assay, and inadequate number of viral copies (<250 copies / mL). A negative result must be combined with clinical observations, patient history, and epidemiological information.  Fact Sheet for Patients:   StrictlyIdeas.no  Fact Sheet for Healthcare Providers: BankingDealers.co.za  This test is not yet approved or  cleared by the [place] FDA and has been authorized for detection and/or diagnosis of SARS-CoV-2 by FDA under an Emergency Use Authorization (EUA).  This EUA will remain in effect (meaning this test can be used) for the duration of the COVID-19 declaration under Section 564(b)(1) of the Act, 21 U.S.C. section 360bbb-3(b)(1), unless the authorization is terminated or revoked sooner.  Performed at [facility], [address] [place],  [address]          Radiology Studies: No results found.      Scheduled Meds: . aspirin EC  81 mg Oral Daily  . carvedilol  25 mg Oral BID  . furosemide  20 mg Intravenous BID  . heparin  5,000 Units Subcutaneous Q8H  . latanoprost  1 drop Both Eyes QHS  . levothyroxine  88 mcg Oral QAC breakfast  . mometasone-formoterol  2 puff Inhalation BID  . pantoprazole (PROTONIX) IV  40 mg Intravenous Q12H  . predniSONE  5 mg Oral Daily  . verapamil  120 mg Oral Daily   Continuous Infusions: . dextrose 5 % and 0.9 % NaCl with KCl 20 mEq/L 75 mL/hr at 11/06/19 2007     LOS: 5 days    Time spent: 64mins    Kathie Dike, MD [facility]   If 7PM-7AM, please contact night-coverage [email or web address]  11/07/2019, 7:53 PM

## 2019-11-07 NOTE — Progress Notes (Signed)
Subjective: Nausea with eating. No nausea currently. States anything she eats, she will throw back up. Will stay for a little but then come back up. Nausea associated with oral intake. No abdominal pain. States fingers on right hand and left hand with numbness. Chronic. Was to see Vascular as outpatient tomorrow. Had clear liquids for breakfast. Unable to do GES today. Scheduled for tomorrow.   Objective: Vital signs in last 24 hours: Temp:  [98 F (36.7 C)-98.6 F (37 C)] 98.6 F (37 C) (06/28 0412) Pulse Rate:  [71-84] 81 (06/28 0412) Resp:  [16-18] 16 (06/28 0412) BP: (113-144)/(74-89) 113/74 (06/28 0412) SpO2:  [94 %-100 %] 96 % (06/28 0759) Last BM Date: 11/06/19 General:   Alert and oriented, cachectic-appearing, frail, mask-like features Head:  Normocephalic and atraumatic. Abdomen:  Bowel sounds present, soft, mild TTP upper abdomen Msk:  Symmetrical without gross deformities. Normal posture. Extremities:  With socks on both hands. Finger tips with dark discoloration, chronic. Warm to touch.  Neurologic:  Alert and  oriented x4   Intake/Output from previous day: 06/27 0701 - 06/28 0700 In: 2738.8 [P.O.:1080; I.V.:1658.8] Out: 2100 [Urine:2100] Intake/Output this shift: No intake/output data recorded.  Lab Results: Recent Labs    11/07/19 0437  WBC 4.2  HGB 9.2*  HCT 28.3*  PLT 242   BMET Recent Labs    11/05/19 0528 11/05/19 0528 11/06/19 0518 11/06/19 0822 11/07/19 0437  NA 135  --  135  --  136  K 3.8  --  4.1  --  4.2  CL 101  --  101  --  100  CO2 28  --  27  --  28  GLUCOSE 129*   < > 111* 177* 129*  BUN 20  --  18  --  16  CREATININE 1.17*  --  1.09*  --  1.09*  CALCIUM 7.7*  --  7.9*  --  7.9*   < > = values in this interval not displayed.   LFT Recent Labs    11/05/19 0528 11/06/19 0518 11/07/19 0437  PROT 5.3* 5.6* 5.2*  ALBUMIN 1.9* 2.4* 2.2*  AST 13* 13* 11*  ALT 11 9 9   ALKPHOS 55 56 54  BILITOT 0.6 0.6 0.7   Lab Results   Component Value Date   IRON 19 (L) 12/28/2018   TIBC 157 (L) 12/28/2018   FERRITIN 201 12/28/2018     Assessment: 69 year old female admitted with hypoglycemia with history of chronic nausea/vomiting, abdominal pain, profound weight loss of greater than 150 pounds in the past 2 years (weight stable last 6 months), dysphagia to solids/pills, recent diagnosis of scleroderma.   Hypoglycemia: further evaluation for insulinoma as outpatient per Dr. Dorris Fetch.   Intractable N/V: chronic, associated weight loss. 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 Reglan has been on hold since yesterday with GES initially planned for today; however, patient had breakfast and will need to move GES to tomorrow. Keep Reglan on hold. May have clear liquids. Discussed with patient and Dr. Roderic Palau. I personally spoke with [place] regarding GES to be moved to 6/29. Known biliary dyskinesia but suspect esophageal motility disorder, likely gastroparesis is contributing to majority of symptoms. Previously has seen Dr. Constance Haw and felt cholecystectomy risks would outweigh benefits.  Pancreatic lesion: MRCP this admission markedly motion degraded. Will need CT abd with pancreatic protocol in May 2022 as she did not have a good quality study with  MRI in setting of motion degradation.   Normocytic anemia: likely chronic disease.   Plan: May have clear liquids today Continue to hold Reglan NPO after midnight GES tomorrow CT with pancreatic protocol May 2022   Annitta Needs, PhD, ANP-BC [facility]     LOS: 5 days    11/07/2019, 10:16 AM

## 2019-11-07 NOTE — Plan of Care (Signed)
  Problem: Pain Managment: Goal: General experience of comfort will improve Outcome: Adequate for Discharge   Problem: Safety: Goal: Ability to remain free from injury will improve Outcome: Adequate for Discharge   Problem: Skin Integrity: Goal: Risk for impaired skin integrity will decrease Outcome: Adequate for Discharge

## 2019-11-07 NOTE — Care Management Important Message (Signed)
Important Message  Patient Details  Name: [name] MRN: [id number] Date of Birth: [birth year]   Medicare Important Message Given:  Yes     Tommy Medal 11/07/2019, 12:54 PM

## 2019-11-07 NOTE — Telephone Encounter (Signed)
Nic changed to ct

## 2019-11-08 ENCOUNTER — Inpatient Hospital Stay (HOSPITAL_COMMUNITY): Payer: Medicare Other

## 2019-11-08 ENCOUNTER — Encounter: Payer: Medicare Other | Admitting: Vascular Surgery

## 2019-11-08 ENCOUNTER — Ambulatory Visit (HOSPITAL_COMMUNITY): Payer: Medicare Other

## 2019-11-08 ENCOUNTER — Inpatient Hospital Stay (HOSPITAL_COMMUNITY): Admission: RE | Admit: 2019-11-08 | Payer: Medicare Other | Source: Ambulatory Visit

## 2019-11-08 LAB — COMPREHENSIVE METABOLIC PANEL
ALT: 9 U/L (ref 0–44)
AST: 12 U/L — ABNORMAL LOW (ref 15–41)
Albumin: 2 g/dL — ABNORMAL LOW (ref 3.5–5.0)
Alkaline Phosphatase: 57 U/L (ref 38–126)
Anion gap: 7 (ref 5–15)
BUN: 16 mg/dL (ref 8–23)
CO2: 27 mmol/L (ref 22–32)
Calcium: 7.6 mg/dL — ABNORMAL LOW (ref 8.9–10.3)
Chloride: 102 mmol/L (ref 98–111)
Creatinine, Ser: 1.14 mg/dL — ABNORMAL HIGH (ref 0.44–1.00)
GFR calc Af Amer: 56 mL/min — ABNORMAL LOW (ref >60)
GFR calc non Af Amer: 49 mL/min — ABNORMAL LOW (ref >60)
Glucose, Bld: 127 mg/dL — ABNORMAL HIGH (ref 70–99)
Potassium: 4 mmol/L (ref 3.5–5.1)
Sodium: 136 mmol/L (ref 135–145)
Total Bilirubin: 0.6 mg/dL (ref 0.3–1.2)
Total Protein: 5.3 g/dL — ABNORMAL LOW (ref 6.5–8.1)

## 2019-11-08 LAB — GLUCOSE, CAPILLARY
Glucose-Capillary: 110 mg/dL — ABNORMAL HIGH (ref 70–99)
Glucose-Capillary: 121 mg/dL — ABNORMAL HIGH (ref 70–99)
Glucose-Capillary: 122 mg/dL — ABNORMAL HIGH (ref 70–99)
Glucose-Capillary: 129 mg/dL — ABNORMAL HIGH (ref 70–99)
Glucose-Capillary: 68 mg/dL — ABNORMAL LOW (ref 70–99)
Glucose-Capillary: 84 mg/dL (ref 70–99)

## 2019-11-08 MED ORDER — TECHNETIUM TC 99M SULFUR COLLOID
2.0000 | Freq: Once | INTRAVENOUS | Status: AC | PRN
  Administered 2019-11-08: 2 via ORAL

## 2019-11-08 MED ORDER — METOCLOPRAMIDE HCL 5 MG/ML IJ SOLN
7.5000 mg | Freq: Three times a day (TID) | INTRAMUSCULAR | Status: DC
  Administered 2019-11-09 – 2019-11-14 (×17): 7.5 mg via INTRAVENOUS
  Filled 2019-11-08 (×17): qty 2

## 2019-11-08 NOTE — Progress Notes (Signed)
PROGRESS NOTE    [name]  NKN:[id number] DOB: 1950/11/24 DOA: 11/01/2019 PCP: Maryruth Hancock, MD    Brief Narrative:  HPI: [name] is a 69 y.o. female with medical history significant for COPD asthma with chronic respiratory failure on 3 L, paroxysmal atrial fibrillation, HOCM,liver cirrhosis, diabetes mellitus. Patient was sent to the ED via EMS from her endocrinologist's office with blood sugar of 32, she was given D50 and blood sugar increased to 47, but on arrival to the ED, her blood sugar was 20. Over the past 3 months she has been vomiting every day, at least 2-3 times a day, she has been vomiting almost every thing she tries to eat, with resulted in weight loss.  She reports chronic intermittent abdominal pain.  But she has no abdominal pain today.  No loose stools.  Patient is not on any medications for her diabetes.  She has chronic lower extremity swelling that that fluctuates but is currently unchanged, no chest pain, no difficulty breathing.   Assessment & Plan:   Principal Problem:   Hypoglycemia Active Problems:   Type 2 diabetes mellitus without complication, without long-term current use of insulin (HCC)   Essential hypertension   Asthma, chronic   Chronic respiratory failure with hypoxia (HCC)   ILD (interstitial lung disease) (HCC)   Hypothyroidism   Hepatic cirrhosis (HCC)   Paroxysmal atrial fibrillation (HCC)   Loss of weight   Abnormal CT scan   Intractable vomiting   1. Hypoglycemia.  Discussed at length with patient's endocrinologist, Dr. Dorris Fetch.  Felt that hypoglycemia is related to severe malnutrition and depleted glycogen stores.  Recommendations were to keep the patient on dextrose infusion while in the hospital.  Also, keep gloves on patient's hands to help keep them warm.  This may improve accuracy of CBG.  He recommended holding off on further work-up for insulinoma in current setting since results may be skewed.  This will be  further followed up as an outpatient.  Overall blood sugars are better with dextrose infusion.  Need to ensure that patient is getting adequate enteral nutrition prior to discontinuing dextrose infusion. 2. Intractable, nausea and vomiting.  Appears to be a chronic issue.  Associated with substantial weight loss.  GI following and assisting with further work-up.  Both possible related to gastroparesis versus severe esophagitis.  Patient may need artificial nutrition through jejunal feeds.  Discussed with Dr. Dorris Fetch who also felt strongly that she will need tube feeds.  She was found to have delayed gastric emptying on nuclear medicine study.  Discussed with Dr. Melony Overly who recommended PEG/PEJ placement for nutrition.  Interventional radiology consulted for placement. 3. Diabetes.  She is not on any medications.  Continues to have episodes of hypoglycemia. 4. Hypertension.  Stable on carvedilol and verapamil 5. Hypothyroidism.  TSH of 10.5.  Continue home dose of Synthroid. 6. COPD/chronic respiratory failure/interstitial lung disease.  Follows with pulmonology.  Continue home dose of prednisone and supplemental oxygen. 7. Scleroderma. On chronic prednisone therapy.  8. History of liver cirrhosis and diastolic heart failure.  Overall anasarca is improving with Lasix/albumin 9. Goals of care.  Discussed CODE STATUS and patient agreed with DNR status.  Initially, she was against any feeding tube, but is now agreeable.   DVT prophylaxis: heparin injection 5,000 Units Start: 11/01/19 2230  Code Status: DNR Family Communication: Discussed with patient Disposition Plan: Status is: Inpatient  Remains inpatient appropriate because:Persistent severe electrolyte disturbances and IV treatments appropriate due to  intensity of illness or inability to take PO   Dispo: The patient is from: Home              Anticipated d/c is to: Home              Anticipated d/c date is: > 3 days              Patient currently  is not medically stable to d/c.   Consultants:   Gastroenterology  Procedures:     Antimicrobials:       Subjective: Had Gastric emptying study earlier today.  Wants to advance diet.  No abdominal pain.  Objective: Vitals:   11/08/19 0451 11/08/19 0734 11/08/19 1340 11/08/19 1942  BP: 117/73  127/83   Pulse: 80  73   Resp: 18  19   Temp: 99.5 F (37.5 C)  99.9 F (37.7 C)   TempSrc: Oral  Oral   SpO2: 100% 99%  96%  Weight:      Height:        Intake/Output Summary (Last 24 hours) at 11/08/2019 2005 Last data filed at 11/08/2019 1700 Gross per 24 hour  Intake 1855.6 ml  Output 1600 ml  Net 255.6 ml   Filed Weights   11/01/19 1448 11/01/19 2118  Weight: 68.9 kg 69.6 kg    Examination:  General exam: Alert, awake, oriented x 3 Respiratory system: Clear to auscultation. Respiratory effort normal. Cardiovascular system:RRR. No murmurs, rubs, gallops. Gastrointestinal system: Abdomen is nondistended, soft and nontender. No organomegaly or masses felt. Normal bowel sounds heard. Central nervous system: Alert and oriented. No focal neurological deficits. Extremities: 1+ edema bilaterally Skin: No rashes, lesions or ulcers Psychiatry: Judgement and insight appear normal. Mood & affect appropriate.     Data Reviewed: I have personally reviewed following labs and imaging studies  CBC: Recent Labs  Lab 11/03/19 0445 11/07/19 0437  WBC 5.6 4.2  HGB 10.2* 9.2*  HCT 30.7* 28.3*  MCV 87.2 87.3  PLT 290 248   Basic Metabolic Panel: Recent Labs  Lab 11/03/19 0445 11/03/19 0445 11/04/19 0519 11/04/19 0519 11/05/19 0528 11/06/19 0518 11/06/19 0822 11/07/19 0437 11/08/19 0444  NA 139   < > 135  --  135 135  --  136 136  K 3.6   < > 3.6  --  3.8 4.1  --  4.2 4.0  CL 99   < > 99  --  101 101  --  100 102  CO2 31   < > 29  --  28 27  --  28 27  GLUCOSE 161*   < > 125*   < > 129* 111* 177* 129* 127*  BUN 24*   < > 21  --  20 18  --  16 16  CREATININE  1.23*   < > 1.19*  --  1.17* 1.09*  --  1.09* 1.14*  CALCIUM 7.7*   < > 7.4*  --  7.7* 7.9*  --  7.9* 7.6*  MG 2.4  --   --   --   --   --   --   --   --    < > = values in this interval not displayed.   GFR: Estimated Creatinine Clearance: 46.3 mL/min (A) (by C-G formula based on SCr of 1.14 mg/dL (H)). Liver Function Tests: Recent Labs  Lab 11/03/19 0445 11/05/19 0528 11/06/19 0518 11/07/19 0437 11/08/19 0444  AST 16 13* 13* 11* 12*  ALT 10  11 9 9 9   ALKPHOS 54 55 56 54 57  BILITOT 0.5 0.6 0.6 0.7 0.6  PROT 5.4* 5.3* 5.6* 5.2* 5.3*  ALBUMIN 1.6* 1.9* 2.4* 2.2* 2.0*   No results for input(s): LIPASE, AMYLASE in the last 168 hours. No results for input(s): AMMONIA in the last 168 hours. Coagulation Profile: No results for input(s): INR, PROTIME in the last 168 hours. Cardiac Enzymes: No results for input(s): CKTOTAL, CKMB, CKMBINDEX, TROPONINI in the last 168 hours. BNP (last 3 results) No results for input(s): PROBNP in the last 8760 hours. HbA1C: No results for input(s): HGBA1C in the last 72 hours. CBG: Recent Labs  Lab 11/08/19 0314 11/08/19 0727 11/08/19 1629 11/08/19 1714 11/08/19 1953  GLUCAP 129* 110* 68* 84 121*   Lipid Profile: No results for input(s): CHOL, HDL, LDLCALC, TRIG, CHOLHDL, LDLDIRECT in the last 72 hours. Thyroid Function Tests: No results for input(s): TSH, T4TOTAL, FREET4, T3FREE, THYROIDAB in the last 72 hours. Anemia Panel: No results for input(s): VITAMINB12, FOLATE, FERRITIN, TIBC, IRON, RETICCTPCT in the last 72 hours. Sepsis Labs: No results for input(s): PROCALCITON, LATICACIDVEN in the last 168 hours.  Recent Results (from the past 240 hour(s))  SARS Coronavirus 2 by RT PCR (hospital order, performed in [facility] hospital lab) Nasopharyngeal Nasopharyngeal Swab     Status: None   Collection Time: 11/01/19  3:16 PM   Specimen: Nasopharyngeal Swab  Result Value Ref Range Status   SARS Coronavirus 2 NEGATIVE NEGATIVE Final     Comment: (NOTE) SARS-CoV-2 target nucleic acids are NOT DETECTED.  The SARS-CoV-2 RNA is generally detectable in upper and lower respiratory specimens during the acute phase of infection. The lowest concentration of SARS-CoV-2 viral copies this assay can detect is 250 copies / mL. A negative result does not preclude SARS-CoV-2 infection and should not be used as the sole basis for treatment or other patient management decisions.  A negative result may occur with improper specimen collection / handling, submission of specimen other than nasopharyngeal swab, presence of viral mutation(s) within the areas targeted by this assay, and inadequate number of viral copies (<250 copies / mL). A negative result must be combined with clinical observations, patient history, and epidemiological information.  Fact Sheet for Patients:   StrictlyIdeas.no  Fact Sheet for Healthcare Providers: BankingDealers.co.za  This test is not yet approved or  cleared by the [place] FDA and has been authorized for detection and/or diagnosis of SARS-CoV-2 by FDA under an Emergency Use Authorization (EUA).  This EUA will remain in effect (meaning this test can be used) for the duration of the COVID-19 declaration under Section 564(b)(1) of the Act, 21 U.S.C. section 360bbb-3(b)(1), unless the authorization is terminated or revoked sooner.  Performed at [facility], [address] [place], [place] [address]          Radiology Studies: CT ABDOMEN WO CONTRAST  Result Date: 11/08/2019 CLINICAL DATA:  Assess anatomy for possible G-tube placement EXAM: CT ABDOMEN WITHOUT CONTRAST TECHNIQUE: Multidetector CT imaging of the abdomen was performed following the standard protocol without IV contrast. COMPARISON:  09/19/2019 FINDINGS: Lower chest: Small bilateral pleural effusions. Coronary artery and aortic calcifications. Bronchiectasis in the lower lobes.  Hepatobiliary: No focal hepatic abnormality. Gallbladder unremarkable. Pancreas: No focal abnormality or ductal dilatation. Spleen: No focal abnormality.  Normal size. 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 No hydronephrosis. No renal or adrenal mass. Stomach/Bowel: Small bowel loops are dilated and fluid-filled. Numerous  small bowel loops as well as the colon overlie the stomach anteriorly and would make gastrostomy tube placement difficult. Diverticula seen within the colon. Vascular/Lymphatic: Aortic atherosclerosis. No enlarged abdominal lymph nodes. Other: No free fluid or free air. Musculoskeletal: No acute bony abnormality. IMPRESSION: Dilated, fluid-filled small bowel loops. Appearance is concerning for possible small bowel obstruction. Numerous small bowel loops and the colon lie anterior to the stomach and would make gastrostomy tube placement difficult. Small bilateral pleural effusions.  Bibasilar bronchiectasis. Colonic diverticulosis. Electronically Signed   By: Rolm Baptise M.D.   On: 11/08/2019 19:27   NM GASTRIC EMPTYING  Result Date: 11/08/2019 CLINICAL DATA:  Refractory nausea, vomiting and abdominal pain for weeks, weight loss EXAM: NUCLEAR MEDICINE GASTRIC EMPTYING SCAN TECHNIQUE: After oral ingestion of radiolabeled meal, sequential abdominal images were obtained for 120 minutes. Residual percentage of activity remaining within the stomach was calculated at 60 and 120 minutes. Patient was planned to be a 4 hour exam but she terminated the procedure at 2 hours, refusing to complete additional imaging. After oral ingestion of radiolabeled meal, sequential abdominal images were obtained for 4 hours. Percentage of activity emptying the stomach was calculated at 1 hour, 2 hour, 3 hour, and 4 hours. RADIOPHARMACEUTICALS:  2 mCi Tc-67m sulfur colloid in standardized meal COMPARISON:  12/10/2018 FINDINGS: Expected location of the  stomach in the left upper quadrant. Some retained contrast in distal esophagus versus hiatal hernia throughout exam. Ingested meal empties the stomach slowly over the course of the study. 3% emptying at 1 hour. 30% emptying at 2 hours. Findings represent delayed gastric emptying at 2 hours. IMPRESSION: Delayed gastric emptying at 2 hours. Patient refused completion of the final 2 hours of the 4 hour exam. Electronically Signed   By: Lavonia Dana M.D.   On: 11/08/2019 14:14        Scheduled Meds: . aspirin EC  81 mg Oral Daily  . carvedilol  25 mg Oral BID  . furosemide  20 mg Intravenous BID  . heparin  5,000 Units Subcutaneous Q8H  . latanoprost  1 drop Both Eyes QHS  . levothyroxine  88 mcg Oral QAC breakfast  . [START ON 11/09/2019] metoCLOPramide (REGLAN) injection  7.5 mg Intravenous TID AC  . mometasone-formoterol  2 puff Inhalation BID  . pantoprazole (PROTONIX) IV  40 mg Intravenous Q12H  . predniSONE  5 mg Oral Daily  . verapamil  120 mg Oral Daily   Continuous Infusions: . dextrose 5 % and 0.9 % NaCl with KCl 20 mEq/L 75 mL/hr at 11/08/19 1300     LOS: 6 days    Time spent: 52mins    Kathie Dike, MD [facility]   If 7PM-7AM, please contact night-coverage [email or web address]  11/08/2019, 8:05 PM

## 2019-11-08 NOTE — TOC Initial Note (Signed)
Transition of Care [facility]) - Initial/Assessment Note    Patient Details  Name: [name] MRN: [id number] Date of Birth: 13-Nov-1950  [facility]) CM/SW Contact:    Ihor Gully, LCSW Phone Number: 11/08/2019, 3:42 PM  Clinical Narrative:                 Patient from home alone. Home oxygen 3L. Admitted for hypoglycemia. Having gastric emptying study today. High risk for readmission due to number of active prescriptions.  TOC will follow through d/c and address needs as they arise.   Expected Discharge Plan: [place] Barriers to Discharge: Continued Medical Work up   Patient Goals and CMS Choice        Expected Discharge Plan and Services Expected Discharge Plan: [place]       Living arrangements for the past 2 months: Apartment                                      Prior Living Arrangements/Services Living arrangements for the past 2 months: Apartment Lives with:: Self Patient language and need for interpreter reviewed:: Yes Do you feel safe going back to the place where you live?: Yes      Need for Family Participation in Patient Care: Yes (Comment) Care giver support system in place?: Yes (comment) Current home services: DME Criminal Activity/Legal Involvement Pertinent to Current Situation/Hospitalization: No - Comment as needed  Activities of Daily Living Home Assistive Devices/Equipment: Cane (specify quad or straight), Walker (specify type) ADL Screening (condition at time of admission) Patient's cognitive ability adequate to safely complete daily activities?: Yes Is the patient deaf or have difficulty hearing?: No Does the patient have difficulty seeing, even when wearing glasses/contacts?: No Does the patient have difficulty concentrating, remembering, or making decisions?: No Patient able to express need for assistance with ADLs?: Yes Does the patient have difficulty dressing or bathing?:  No Independently performs ADLs?: No Communication: Independent Dressing (OT): Needs assistance Is this a change from baseline?: Pre-admission baseline Grooming: Needs assistance Is this a change from baseline?: Pre-admission baseline Feeding: Independent Bathing: Needs assistance Is this a change from baseline?: Pre-admission baseline Toileting: Needs assistance Is this a change from baseline?: Pre-admission baseline In/Out Bed: Independent with device (comment) Walks in Home: Independent with device (comment) Does the patient have difficulty walking or climbing stairs?: Yes Weakness of Legs: Both Weakness of Arms/Hands: None  Permission Sought/Granted                  Emotional Assessment Appearance:: Appears stated age     Orientation: : Oriented to Self, Oriented to Place, Oriented to  Time, Oriented to Situation Alcohol / Substance Use: Not Applicable    Admission diagnosis:  Hypoglycemia [E16.2] Patient Active Problem List   Diagnosis Date Noted  . Abnormal CT scan   . Intractable vomiting   . Hypoglycemia 11/01/2019  . Pancreatic cyst 10/17/2019  . Esophageal dysmotility 09/29/2019  . Rib pain on right side 08/18/2019  . Diarrhea 08/12/2019  . Loss of weight 04/28/2019  . Paroxysmal atrial fibrillation ([place]) 03/01/2019  . Multinodular goiter 01/26/2019  . Regurgitation of food 01/21/2019  . Calculus of gallbladder without cholecystitis without obstruction 01/21/2019  . Numbness and tingling in both hands 12/25/2018  . Chronic cholecystitis with calculus 12/16/2018  . Abnormal LFTs 12/16/2018  . Abnormal weight loss 12/16/2018  .  Upper abdominal pain   . Elevated transaminase level   . Hepatic cirrhosis ([place])   . Nausea and vomiting in adult   . Acute cholecystitis 12/09/2018  . Pyrosis 03/12/2018  . History of Helicobacter pylori infection 02/01/2018  . Dysphagia 02/01/2018  . Anxiety and depression   . Hypothyroidism 12/22/2017  . ILD  (interstitial lung disease) ([place]) 11/25/2017  . Chronic respiratory failure with hypoxia ([place]) 11/12/2017  . Pericardial effusion   . Paresthesia 10/19/2017  . Neck pain 10/19/2017  . PCP NOTES >>> 02/23/2015  . Nocturnal oxygen desaturation 01/02/2015  . Asthma, chronic 01/02/2015  . Gastroesophageal reflux disease 07/25/2013  . DOE (dyspnea on exertion) 04/25/2013  . Internal hemorrhoids without mention of complication 21/07/1279  . Medicare annual wellness visit, subsequent 06/06/2011  . Anemia 12/10/2009  . SKIN LESION 08/06/2009  . Hyperlipidemia 01/11/2009  . Depression 09/26/2008  . INSOMNIA 09/26/2008  . OBSTRUCTIVE SLEEP APNEA 06/23/2008  . Chest pain 11/18/2007  . Type 2 diabetes mellitus without complication, without long-term current use of insulin ([place]) 10/06/2006  . Essential hypertension 10/06/2006  . Osteoarthritis 10/06/2006   PCP:  Maryruth Hancock, MD Pharmacy:   [facility], [address] [place] [address] Phone: [phone] Fax: [phone]  [facility], [address] [place], [address] [place], [address] [place] [address] Phone: [phone] Fax: [phone]     Social Determinants of Health (SDOH) Interventions    Readmission Risk Interventions No flowsheet data found.

## 2019-11-08 NOTE — Progress Notes (Addendum)
Subjective: Feels like she is doing somewhat better today. Vomited about 4 times yesterday after consuming liquid diet. Sometimes she feels this sits in her lower chest and this is why she vomits. Intermittent nausea. None today. Had the eggs and toast this morning with a small amount of water. She tolerated this well. No vomiting today. No GERD symptoms. This seems to be well controlled. No abdominal pain.   Doesn't remember taking Reglan at home.   No diarrhea. Last BM was soft/formed this morning. No brbpr or melena.   Concerned about needing to get home in the next 1-2 days to pay bills. States she needs to pay rent. Concerned she wont have a place to live. Lives alone. No one around that can help.   Objective: Vital signs in last 24 hours: Temp:  [99.5 F (37.5 C)] 99.5 F (37.5 C) (06/29 0451) Pulse Rate:  [79-83] 80 (06/29 0451) Resp:  [18] 18 (06/29 0451) BP: (116-117)/(73) 117/73 (06/29 0451) SpO2:  [99 %-100 %] 99 % (06/29 0734) Last BM Date: 11/07/19 General:   Alert and oriented, pleasant,  Head:  Normocephalic and atraumatic. Eyes:  No icterus, sclera clear. Conjuctiva pink.  Mouth:  Without lesions, mucosa pink and moist.  Neck:  Supple, without thyromegaly or masses.  Heart:  S1, S2 present, no murmurs noted.  Lungs: Clear to auscultation bilaterally, without wheezing, rales, or rhonchi.  Abdomen:  Bowel sounds present, soft, non-tender, non-distended. No HSM or hernias noted. No rebound or guarding. No masses appreciated  Msk:  Symmetrical without gross deformities. Normal posture. Pulses:  Normal pulses noted. Extremities:  Without clubbing or edema. Neurologic:  Alert and  oriented x4;  grossly normal neurologically. Skin:  Warm and dry, intact without significant lesions.  Cervical Nodes:  No significant cervical adenopathy. Psych:  Alert and cooperative. Normal mood and affect.  Intake/Output from previous day: 06/28 0701 - 06/29 0700 In: 1962 [P.O.:600;  I.V.:435] Out: 3800 [Urine:3800] Intake/Output this shift: No intake/output data recorded.  Lab Results: Recent Labs    11/07/19 0437  WBC 4.2  HGB 9.2*  HCT 28.3*  PLT 242   BMET Recent Labs    11/06/19 0518 11/06/19 0518 11/06/19 0822 11/07/19 0437 11/08/19 0444  NA 135  --   --  136 136  K 4.1  --   --  4.2 4.0  CL 101  --   --  100 102  CO2 27  --   --  28 27  GLUCOSE 111*   < > 177* 129* 127*  BUN 18  --   --  16 16  CREATININE 1.09*  --   --  1.09* 1.14*  CALCIUM 7.9*  --   --  7.9* 7.6*   < > = values in this interval not displayed.   LFT Recent Labs    11/06/19 0518 11/07/19 0437 11/08/19 0444  PROT 5.6* 5.2* 5.3*  ALBUMIN 2.4* 2.2* 2.0*  AST 13* 11* 12*  ALT 9 9 9   ALKPHOS 56 54 57  BILITOT 0.6 0.7 0.6    Assessment: 69 year old female admitted with hypoglycemia with history of chronic nausea/vomiting, abdominal pain, profound weight loss of greater than 150 pounds in the past 2 years (weight stable last 6 months), dysphagia to solids/pills, recent diagnosis of scleroderma.   Intractable N/V: Chronic, associated significant weight loss. Concern for gastroparesis contributing to severe reflux esophagitis (Jan 2021) and chronic N/V. Also with known severely impaired esophageal motility. Dysmotility of upper GI  tract likely secondary to scleroderma. Gastrointestinal tract involvement also can include motility issues of the small bowel and colon with associated SIBO and pancreatic exocrine insufficiency.  Reglan listed on OP medication list but patient doesn't remember taking this. She received IV reglan 5mg  q 6 hours while inpatient from 6/23-6/26.Per review of progress notes, doesn't seem this was very effective. GES completed today still pending for final result. Known biliary dyskinesia but suspect esophageal motility disorder, likely gastroparesis is contributing to majority of symptoms. Previously has seen Dr. Constance Haw and felt cholecystectomy risks would  outweigh benefits.  Will follow-up on GES. Interestingly patient has not had any vomiting today and had egg/toast for GES. We could try advancing her diet slowly to full liquids today to see if she tolerates this. Unfortunately, as N/V has been longstanding, I doubt patient is going to tolerate advancing her diet very much and doubt she is going to be able to take enough in to maintain herself. Additional concern is the significant hypoglycemia. Patient will either have to be able to maintain po intake or will need J tube. I feel we are likely moving towards needing J tube with oral intake as tolerated. Will discuss with Dr. Laural Golden to get his thoughts as well.  Patient is agreeable to J tube if needed.   Pancreatic lesion: MRCP this admission markedly motion degraded. Will need CT abd with pancreatic protocol in May 2022 as she did not have a good quality study with MRI in setting of motion degradation.   Hypoglycemia: Further evaluation for insulinoma as outpatient per Dr. Dorris Fetch. Per Dr. Blythe Stanford note, he has discussed at length with Dr. Dorris Fetch who felt ypoglycemia is related to severe malnutrition and depleted glycogen stores.  Recommendations were to keep the patient on dextrose infusion while in the hospital.  Normocytic anemia: likely chronic disease.   Plan: Follow-up on GES.  Resume clear liquids for now.  Discuss role of J tube with Dr. Salley Slaughter vs trial of full liquids.  Continue PPI BID CT with pancreatic protocol May 2022   LOS: 6 days    11/08/2019, 10:35 AM   Aliene Altes, PA-C [facility]  Addendum: Reviewed GES: Some retained contrast in distal esophagus versus hiatal hernia throughout exam. Ingested meal empties the stomach slowly over the course of the study. 3% emptying at 1 hour. 30% emptying at 2 hours. Findings represent delayed gastric emptying at 2 hours.  GES again points towards esophageal motility issue and gastroparesis contributing to chronic N/V.  Will discuss with Dr. Laural Golden.

## 2019-11-09 LAB — GLUCOSE, CAPILLARY
Glucose-Capillary: 125 mg/dL — ABNORMAL HIGH (ref 70–99)
Glucose-Capillary: 187 mg/dL — ABNORMAL HIGH (ref 70–99)
Glucose-Capillary: 108 mg/dL — ABNORMAL HIGH (ref 70–99)
Glucose-Capillary: 131 mg/dL — ABNORMAL HIGH (ref 70–99)
Glucose-Capillary: 139 mg/dL — ABNORMAL HIGH (ref 70–99)

## 2019-11-09 LAB — CBC
HCT: 27.2 % — ABNORMAL LOW (ref 36.0–46.0)
Hemoglobin: 9.3 g/dL — ABNORMAL LOW (ref 12.0–15.0)
MCH: 29.5 pg (ref 26.0–34.0)
MCHC: 34.2 g/dL (ref 30.0–36.0)
MCV: 86.3 fL (ref 80.0–100.0)
Platelets: 183 10*3/uL (ref 150–400)
RBC: 3.15 MIL/uL — ABNORMAL LOW (ref 3.87–5.11)
RDW: 16.3 % — ABNORMAL HIGH (ref 11.5–15.5)
WBC: 6.1 10*3/uL (ref 4.0–10.5)
nRBC: 0 % (ref 0.0–0.2)

## 2019-11-09 LAB — COMPREHENSIVE METABOLIC PANEL
ALT: 10 U/L (ref 0–44)
AST: 13 U/L — ABNORMAL LOW (ref 15–41)
Albumin: 2 g/dL — ABNORMAL LOW (ref 3.5–5.0)
Alkaline Phosphatase: 66 U/L (ref 38–126)
Anion gap: 9 (ref 5–15)
BUN: 18 mg/dL (ref 8–23)
CO2: 24 mmol/L (ref 22–32)
Calcium: 7.8 mg/dL — ABNORMAL LOW (ref 8.9–10.3)
Chloride: 102 mmol/L (ref 98–111)
Creatinine, Ser: 1.19 mg/dL — ABNORMAL HIGH (ref 0.44–1.00)
GFR calc Af Amer: 54 mL/min — ABNORMAL LOW (ref >60)
GFR calc non Af Amer: 46 mL/min — ABNORMAL LOW (ref >60)
Glucose, Bld: 129 mg/dL — ABNORMAL HIGH (ref 70–99)
Potassium: 4 mmol/L (ref 3.5–5.1)
Sodium: 135 mmol/L (ref 135–145)
Total Bilirubin: 0.7 mg/dL (ref 0.3–1.2)
Total Protein: 5.5 g/dL — ABNORMAL LOW (ref 6.5–8.1)

## 2019-11-09 MED ORDER — BOOST / RESOURCE BREEZE PO LIQD CUSTOM
1.0000 | Freq: Three times a day (TID) | ORAL | Status: DC
  Administered 2019-11-10 – 2019-11-18 (×8): 1 via ORAL

## 2019-11-09 NOTE — Progress Notes (Signed)
[facility]  Patient known to me and will see in AM. Will review imaging and determine if can attempt PEG so it can be converted to [place] by IR.   Earliest could do PEG Is Friday.   Curlene Labrum, MD [facility] [address] [place], [place] [address] [phone] (office)

## 2019-11-09 NOTE — Progress Notes (Signed)
PROGRESS NOTE  [name] DGL:[id number] DOB: Nov 18, 1950 DOA: 11/01/2019 PCP: Maryruth Hancock, MD  Brief History:  69 y.o.femalewith medical history significant forCOPD asthma with chronic respiratory failure on 3 L, paroxysmal atrial fibrillation,HOCM,liver cirrhosis, diabetes mellitus. Patient was sent to the ED via EMS from her endocrinologist'soffice with blood sugar of 32,she was given D50 and blood sugar increased to 47, but on arrival to the ED, her blood sugar was 20.  Patient has had profound weight loss of greater than 150 pounds in the past 2 years (weight stable last 6 months), dysphagia to solids/pills, recent diagnosis of scleroderma.   Over the past 3 months she has been vomiting every day, at least 2-3 times a day, she has been vomiting almostevery thing shetries to eat,withresulted in weight loss. She reports chronic intermittent abdominal pain. But she has no abdominal pain today. No loose stools. Patient is not on any medications for her diabetes.  She has chronic lower extremity swelling that that fluctuates but is currently unchanged,no chest pain, no difficulty breathing.  Assessment/Plan: Hypoglycemia/FTT/Intractable Nausea/vomiting -Dr. Roderic Palau discussed with endocrine Dr. Dorris Fetch.  Felt that hypoglycemia is related to severe malnutrition and depleted glycogen stores.  Recommendations were to keep the patient on dextrose infusion while in the hospital.  Also, keep gloves on patient's hands to help keep them warm.  This may improve accuracy of CBG.  He recommended holding off on further work-up for insulinoma in current setting since results may be skewed.  This will be further followed up as an outpatient.  Overall blood sugars are better with dextrose infusion.  Need to ensure that patient is getting adequate enteral nutrition prior to discontinuing dextrose infusion.  Intractable nausea/vomiting -worsen last 3 months -related to severe reflux  esophagitis and gastroparesis -05/19/19--EGD--severe reflux esophagitis -GES--confirms gastroparesis -reglan increased to 7.5 mg tid -GI following and assisting with further work-up -Dr. Laural Golden agrees with PEG/PEJ -IR consulted-->anatomy not conducive for PEC -consult general surgery  Diabetes Mellitus type 2 -11/01/19 A1c--5.0 -no on any agents as outpt -hypoglycemic epidoses improved on IV dextrose infusion  Dilated SB loops -likely ileus, not clinically obstructed -passing flatus; had small BM this afternoon  Chronic respiratory failure with hypoxia -continue duonebs -normally on 3L at home  Pancreatic lesion: -MRCP this admission markedly motion degraded. Will need CT abd with pancreatic protocol in May 2022 as she did not have a good quality study with MRI in setting of motion degradation.  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  Unable to tolerate po. Still vomiting   Dispo: The patient is from: Home              Anticipated d/c is to: Home              Anticipated d/c date is: 2 days              Patient currently is not medically stable to d/c.        Family Communication:  no Family at bedside  Consultants:  GI, general surgery  Code Status:  DNR  DVT Prophylaxis:  [place] Heparin    Procedures: As Listed in Progress Note Above  Antibiotics: None       Subjective: Pt still vomiting with broth.  Denies f/c, cp, sob, abd pain, dysuria, f/c  Objective: Vitals:   11/08/19 2150 11/09/19 0652 11/09/19 0810 11/09/19 1354  BP: 112/70 110/82  106/67  Pulse: 79 89  71  Resp:    17  Temp: 100.2 F (37.9  C) 99.2 F (37.3 C)  98.4 F (36.9 C)  TempSrc: Oral Oral  Oral  SpO2: 97% 100% 99%   Weight:      Height:        Intake/Output Summary (Last 24 hours) at 11/09/2019 1508 Last data filed at 11/09/2019 0900 Gross per 24 hour  Intake 720 ml  Output 1300 ml  Net -580 ml   Weight change:  Exam:   General:  Pt is alert, follows commands appropriately, not in acute distress  HEENT: No icterus, No thrush, No neck mass, [place]/AT  Cardiovascular: RRR, S1/S2, no rubs, no gallops  Respiratory: bibasilar crackles  Abdomen: Soft/+BS, non tender, non distended, no guarding  Extremities: 1+LE edema, No lymphangitis, No petechiae, No rashes, no synovitis   Data Reviewed: I have personally reviewed following labs and imaging studies Basic Metabolic Panel: Recent Labs  Lab 11/03/19 0445 11/04/19 0519 11/05/19 0528 11/05/19 0528 11/06/19 0518 11/06/19 3244 11/07/19 0437 11/08/19 0444 11/09/19 0459  NA 139   < > 135  --  135  --  136 136 135  K 3.6   < > 3.8  --  4.1  --  4.2 4.0 4.0  CL 99   < > 101  --  101  --  100 102 102  CO2 31   < > 28  --  27  --  28 27 24   GLUCOSE 161*   < > 129*   < > 111* 177* 129* 127* 129*  BUN 24*   < > 20  --  18  --  16 16 18   CREATININE 1.23*   < > 1.17*  --  1.09*  --  1.09* 1.14* 1.19*  CALCIUM 7.7*   < > 7.7*  --  7.9*  --  7.9* 7.6* 7.8*  MG 2.4  --   --   --   --   --   --   --   --    < > = values in this interval not displayed.   Liver Function Tests: Recent Labs  Lab 11/05/19 0528 11/06/19 0518 11/07/19 0437 11/08/19 0444 11/09/19 0459  AST 13* 13* 11* 12* 13*  ALT 11 9 9 9 10   ALKPHOS 55 56 54 57 66  BILITOT 0.6 0.6 0.7 0.6 0.7  PROT 5.3* 5.6* 5.2* 5.3* 5.5*  ALBUMIN 1.9* 2.4* 2.2* 2.0* 2.0*   No results for input(s): LIPASE, AMYLASE in the last 168 hours. No results for input(s): AMMONIA in the last 168 hours. Coagulation Profile: No results for input(s): INR, PROTIME in the last 168 hours. CBC: Recent Labs  Lab  11/03/19 0445 11/07/19 0437 11/09/19 0459  WBC 5.6 4.2 6.1  HGB 10.2* 9.2* 9.3*  HCT 30.7* 28.3* 27.2*  MCV 87.2 87.3 86.3  PLT 290 242 183   Cardiac Enzymes: No results for input(s): CKTOTAL, CKMB, CKMBINDEX, TROPONINI in the last 168 hours. BNP: Invalid input(s): POCBNP CBG: Recent Labs  Lab 11/08/19 1953 11/08/19 2339 11/09/19 0333 11/09/19 0738 11/09/19 1119  GLUCAP 121* 122* 125* 187* 108*   HbA1C: No results for input(s): HGBA1C in the last 72 hours. Urine  analysis:    Component Value Date/Time   COLORURINE YELLOW 08/17/2019 1134   APPEARANCEUR CLEAR 08/17/2019 1134   LABSPEC 1.006 08/17/2019 1134   PHURINE 7.0 08/17/2019 1134   GLUCOSEU NEGATIVE 08/17/2019 1134   GLUCOSEU NEGATIVE 11/30/2013 1057   HGBUR NEGATIVE 08/17/2019 1134   BILIRUBINUR NEGATIVE 12/09/2018 0923   KETONESUR NEGATIVE 08/17/2019 1134   PROTEINUR NEGATIVE 08/17/2019 1134   UROBILINOGEN 0.2 11/30/2013 1057   NITRITE NEGATIVE 08/17/2019 1134   LEUKOCYTESUR NEGATIVE 08/17/2019 1134   Sepsis Labs: @LABRCNTIP (procalcitonin:4,lacticidven:4) ) Recent Results (from the past 240 hour(s))  SARS Coronavirus 2 by RT PCR (hospital order, performed in [place] hospital lab) Nasopharyngeal Nasopharyngeal Swab     Status: None   Collection Time: 11/01/19  3:16 PM   Specimen: Nasopharyngeal Swab  Result Value Ref Range Status   SARS Coronavirus 2 NEGATIVE NEGATIVE Final    Comment: (NOTE) SARS-CoV-2 target nucleic acids are NOT DETECTED.  The SARS-CoV-2 RNA is generally detectable in upper and lower respiratory specimens during the acute phase of infection. The lowest concentration of SARS-CoV-2 viral copies this assay can detect is 250 copies / mL. A negative result does not preclude SARS-CoV-2 infection and should not be used as the sole basis for treatment or other patient management decisions.  A negative result may occur with improper specimen collection / handling, submission of specimen  other than nasopharyngeal swab, presence of viral mutation(s) within the areas targeted by this assay, and inadequate number of viral copies (<250 copies / mL). A negative result must be combined with clinical observations, patient history, and epidemiological information.  Fact Sheet for Patients:   StrictlyIdeas.no  Fact Sheet for Healthcare Providers: BankingDealers.co.za  This test is not yet approved or  cleared by the [place] FDA and has been authorized for detection and/or diagnosis of SARS-CoV-2 by FDA under an Emergency Use Authorization (EUA).  This EUA will remain in effect (meaning this test can be used) for the duration of the COVID-19 declaration under Section 564(b)(1) of the Act, 21 U.S.C. section 360bbb-3(b)(1), unless the authorization is terminated or revoked sooner.  Performed at [facility], [address] [place], [place] [address]      Scheduled Meds: . aspirin EC  81 mg Oral Daily  . carvedilol  25 mg Oral BID  . furosemide  20 mg Intravenous BID  . heparin  5,000 Units Subcutaneous Q8H  . latanoprost  1 drop Both Eyes QHS  . levothyroxine  88 mcg Oral QAC breakfast  . metoCLOPramide (REGLAN) injection  7.5 mg Intravenous TID AC  . mometasone-formoterol  2 puff Inhalation BID  . pantoprazole (PROTONIX) IV  40 mg Intravenous Q12H  . predniSONE  5 mg Oral Daily  . verapamil  120 mg Oral Daily   Continuous Infusions: . dextrose 5 % and 0.9 % NaCl with KCl 20 mEq/L 75 mL/hr at 11/09/19 6948    Procedures/Studies: CT ABDOMEN WO CONTRAST  Result Date: 11/08/2019 CLINICAL DATA:  Assess anatomy for possible G-tube placement EXAM: CT ABDOMEN WITHOUT CONTRAST TECHNIQUE: Multidetector CT imaging of the abdomen was performed following the standard protocol without IV contrast. COMPARISON:  09/19/2019 FINDINGS: Lower chest: Small bilateral pleural effusions. Coronary artery and aortic calcifications.  Bronchiectasis in the lower lobes. Hepatobiliary: No focal hepatic abnormality. Gallbladder unremarkable. Pancreas: No focal abnormality or ductal dilatation. Spleen: No focal abnormality.  Normal size. 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 No hydronephrosis. No renal or adrenal  mass. Stomach/Bowel: Small bowel loops are dilated and fluid-filled. Numerous small bowel loops as well as the colon overlie the stomach anteriorly and would make gastrostomy tube placement difficult. Diverticula seen within the colon. Vascular/Lymphatic: Aortic atherosclerosis. No enlarged abdominal lymph nodes. Other: No free fluid or free air. Musculoskeletal: No acute bony abnormality. IMPRESSION: Dilated, fluid-filled small bowel loops. Appearance is concerning for possible small bowel obstruction. Numerous small bowel loops and the colon lie anterior to the stomach and would make gastrostomy tube placement difficult. Small bilateral pleural effusions.  Bibasilar bronchiectasis. Colonic diverticulosis. Electronically Signed   By: Rolm Baptise M.D.   On: 11/08/2019 19:27   NM GASTRIC EMPTYING  Result Date: 11/08/2019 CLINICAL DATA:  Refractory nausea, vomiting and abdominal pain for weeks, weight loss EXAM: NUCLEAR MEDICINE GASTRIC EMPTYING SCAN TECHNIQUE: After oral ingestion of radiolabeled meal, sequential abdominal images were obtained for 120 minutes. Residual percentage of activity remaining within the stomach was calculated at 60 and 120 minutes. Patient was planned to be a 4 hour exam but she terminated the procedure at 2 hours, refusing to complete additional imaging. After oral ingestion of radiolabeled meal, sequential abdominal images were obtained for 4 hours. Percentage of activity emptying the stomach was calculated at 1 hour, 2 hour, 3 hour, and 4 hours. RADIOPHARMACEUTICALS:  2 mCi Tc-80m sulfur colloid in standardized meal COMPARISON:  12/10/2018  FINDINGS: Expected location of the stomach in the left upper quadrant. Some retained contrast in distal esophagus versus hiatal hernia throughout exam. Ingested meal empties the stomach slowly over the course of the study. 3% emptying at 1 hour. 30% emptying at 2 hours. Findings represent delayed gastric emptying at 2 hours. IMPRESSION: Delayed gastric emptying at 2 hours. Patient refused completion of the final 2 hours of the 4 hour exam. Electronically Signed   By: Lavonia Dana M.D.   On: 11/08/2019 14:14   MR 3D Recon At Scanner  Result Date: 11/03/2019 CLINICAL DATA:  Cystic lesions in the pancreas seen on CT scan. EXAM: MRI ABDOMEN WITHOUT AND WITH CONTRAST (INCLUDING MRCP) TECHNIQUE: Multiplanar multisequence MR imaging of the abdomen was performed both before and after the administration of intravenous contrast. Heavily T2-weighted images of the biliary and pancreatic ducts were obtained, and three-dimensional MRCP images were rendered by post processing. CONTRAST:  71mL GADAVIST GADOBUTROL 1 MMOL/ML IV SOLN COMPARISON:  Abdominal CT 09/19/2019 FINDINGS: Markedly motion degraded study. Lower chest: Probable small right pleural effusion. Hepatobiliary: No gross abnormality within the liver parenchyma. Assessment of liver parenchyma on postcontrast imaging is motion degraded. 6 mm probable gallstone noted in the dependent gallbladder lumen. Common bile duct measures 5 mm diameter in the head of pancreas. Pancreas: 7 mm T2 hyperintense focus identified in the head of pancreas, similar to recent CT. This lesion is obscured by motion artifact on postcontrast imaging. The remaining tiny lesions seen in the pancreas on previous CT are also obscured. No dilatation of the main duct. Spleen:  No gross abnormality. Adrenals/Urinary Tract: No gross enhancing adrenal or renal mass lesion. No evidence for hydronephrosis. 13 mm probable cyst upper pole right kidney with scattered tiny T2 hyperintensities in both kidneys  too small to characterize but likely benign. Stomach/Bowel: Nondilated. Diffuse distension of fluid-filled bowel seen throughout the abdomen. Small bowel loops measure up to 3.9 cm diameter. Vascular/Lymphatic: No abdominal aortic aneurysm. No discernible lymphadenopathy in the abdomen. Other:  No substantial intraperitoneal free fluid. Musculoskeletal: No suspicious marrow enhancement within the visualized bony anatomy. IMPRESSION: 1. Markedly motion  degraded study. Exam is nondiagnostic for small lesions in the solid abdominal organs. 2. 7 mm T2 hyperintense focus in the head of pancreas, similar to recent CT. This lesion is obscured on postcontrast imaging. The remaining tiny lesions seen in the pancreas on previous CT are also obscured by motion artifact. Given patient inability to reproducibly breath hold and remain still, using CT with its better temporal resolution for follow-up is recommended. For cystic lesions of this size in a patient of this age, consensus guidelines recommend repeat imaging every 2 years. This recommendation follows ACR consensus guidelines: Management of Incidental Pancreatic Cysts: A White Paper of the ACR Incidental Findings Committee. [place] 3299;24:268-341. 3. Cholelithiasis. No biliary dilatation. 4. Diffuse distension of fluid-filled bowel loops in the abdomen. Small bowel obstruction cannot be excluded. Electronically Signed   By: Misty Stanley M.D.   On: 11/03/2019 09:23   MR ABDOMEN MRCP W WO CONTAST  Result Date: 11/03/2019 CLINICAL DATA:  Cystic lesions in the pancreas seen on CT scan. EXAM: MRI ABDOMEN WITHOUT AND WITH CONTRAST (INCLUDING MRCP) TECHNIQUE: Multiplanar multisequence MR imaging of the abdomen was performed both before and after the administration of intravenous contrast. Heavily T2-weighted images of the biliary and pancreatic ducts were obtained, and three-dimensional MRCP images were rendered by post processing. CONTRAST:  2mL GADAVIST  GADOBUTROL 1 MMOL/ML IV SOLN COMPARISON:  Abdominal CT 09/19/2019 FINDINGS: Markedly motion degraded study. Lower chest: Probable small right pleural effusion. Hepatobiliary: No gross abnormality within the liver parenchyma. Assessment of liver parenchyma on postcontrast imaging is motion degraded. 6 mm probable gallstone noted in the dependent gallbladder lumen. Common bile duct measures 5 mm diameter in the head of pancreas. Pancreas: 7 mm T2 hyperintense focus identified in the head of pancreas, similar to recent CT. This lesion is obscured by motion artifact on postcontrast imaging. The remaining tiny lesions seen in the pancreas on previous CT are also obscured. No dilatation of the main duct. Spleen:  No gross abnormality. Adrenals/Urinary Tract: No gross enhancing adrenal or renal mass lesion. No evidence for hydronephrosis. 13 mm probable cyst upper pole right kidney with scattered tiny T2 hyperintensities in both kidneys too small to characterize but likely benign. Stomach/Bowel: Nondilated. Diffuse distension of fluid-filled bowel seen throughout the abdomen. Small bowel loops measure up to 3.9 cm diameter. Vascular/Lymphatic: No abdominal aortic aneurysm. No discernible lymphadenopathy in the abdomen. Other:  No substantial intraperitoneal free fluid. Musculoskeletal: No suspicious marrow enhancement within the visualized bony anatomy. IMPRESSION: 1. Markedly motion degraded study. Exam is nondiagnostic for small lesions in the solid abdominal organs. 2. 7 mm T2 hyperintense focus in the head of pancreas, similar to recent CT. This lesion is obscured on postcontrast imaging. The remaining tiny lesions seen in the pancreas on previous CT are also obscured by motion artifact. Given patient inability to reproducibly breath hold and remain still, using CT with its better temporal resolution for follow-up is recommended. For cystic lesions of this size in a patient of this age, consensus guidelines recommend  repeat imaging every 2 years. This recommendation follows ACR consensus guidelines: Management of Incidental Pancreatic Cysts: A White Paper of the ACR Incidental Findings Committee. [place] 9622;29:798-921. 3. Cholelithiasis. No biliary dilatation. 4. Diffuse distension of fluid-filled bowel loops in the abdomen. Small bowel obstruction cannot be excluded. Electronically Signed   By: Misty Stanley M.D.   On: 11/03/2019 09:23    Orson Eva, DO  [facility]  If 7PM-7AM, please contact  night-coverage [email or web address] Password [email or web address] 11/09/2019, 3:08 PM   LOS: 7 days

## 2019-11-09 NOTE — Care Management Important Message (Signed)
Important Message  Patient Details  Name: [name] MRN: [id number] Date of Birth: 1951/04/26   Medicare Important Message Given:  Yes     Tommy Medal 11/09/2019, 3:13 PM

## 2019-11-09 NOTE — Progress Notes (Cosign Needed)
  Request made for percutaneous gastric tube placement per [facility]  Imaging reviewed with Dr Pascal Lux Anatomy NOT conducive to percutaneous gastric tube placement No safe window available  Rec: consider surgical consult  Left message to Dr Roderic Palau and Dr Carles Collet

## 2019-11-09 NOTE — Progress Notes (Addendum)
Subjective: Vomiting after eating chicken broth this morning. No abdominal pain. No nausea currently. +flatus. Frustrated and wants to go home. IR unable to place feeding tube as anatomy not conducive to percutaneous placement. Updated patient. Sitting up in chair.   Objective: Vital signs in last 24 hours: Temp:  [99.2 F (37.3 C)-100.2 F (37.9 C)] 99.2 F (37.3 C) (06/30 0652) Pulse Rate:  [73-89] 89 (06/30 0652) Resp:  [19] 19 (06/29 1340) BP: (110-127)/(70-83) 110/82 (06/30 0652) SpO2:  [96 %-100 %] 99 % (06/30 0810) Last BM Date: 11/07/19 General:   Alert and oriented, mask-like features, chronically ill-appearing Head:  Normocephalic and atraumatic. Abdomen:  Bowel sounds present, soft, non-tender, non-distended.  Neurologic:  Alert and  oriented x4 Psych:   Flat affect  Intake/Output from previous day: 06/29 0701 - 06/30 0700 In: 1855.6 [P.O.:360; I.V.:1495.6] Out: 1300 [Urine:1300] Intake/Output this shift: No intake/output data recorded.  Lab Results: Recent Labs    11/07/19 0437 11/09/19 0459  WBC 4.2 6.1  HGB 9.2* 9.3*  HCT 28.3* 27.2*  PLT 242 183   BMET Recent Labs    11/07/19 0437 11/08/19 0444 11/09/19 0459  NA 136 136 135  K 4.2 4.0 4.0  CL 100 102 102  CO2 28 27 24   GLUCOSE 129* 127* 129*  BUN 16 16 18   CREATININE 1.09* 1.14* 1.19*  CALCIUM 7.9* 7.6* 7.8*   LFT Recent Labs    11/07/19 0437 11/08/19 0444 11/09/19 0459  PROT 5.2* 5.3* 5.5*  ALBUMIN 2.2* 2.0* 2.0*  AST 11* 12* 13*  ALT 9 9 10   ALKPHOS 54 57 66  BILITOT 0.7 0.6 0.7     Studies/Results: CT ABDOMEN WO CONTRAST  Result Date: 11/08/2019 CLINICAL DATA:  Assess anatomy for possible G-tube placement EXAM: CT ABDOMEN WITHOUT CONTRAST TECHNIQUE: Multidetector CT imaging of the abdomen was performed following the standard protocol without IV contrast. COMPARISON:  09/19/2019 FINDINGS: Lower chest: Small bilateral pleural effusions. Coronary artery and aortic  calcifications. Bronchiectasis in the lower lobes. Hepatobiliary: No focal hepatic abnormality. Gallbladder unremarkable. Pancreas: No focal abnormality or ductal dilatation. Spleen: No focal abnormality.  Normal size. 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 No hydronephrosis. No renal or adrenal mass. Stomach/Bowel: Small bowel loops are dilated and fluid-filled. Numerous small bowel loops as well as the colon overlie the stomach anteriorly and would make gastrostomy tube placement difficult. Diverticula seen within the colon. Vascular/Lymphatic: Aortic atherosclerosis. No enlarged abdominal lymph nodes. Other: No free fluid or free air. Musculoskeletal: No acute bony abnormality. IMPRESSION: Dilated, fluid-filled small bowel loops. Appearance is concerning for possible small bowel obstruction. Numerous small bowel loops and the colon lie anterior to the stomach and would make gastrostomy tube placement difficult. Small bilateral pleural effusions.  Bibasilar bronchiectasis. Colonic diverticulosis. Electronically Signed   By: Rolm Baptise M.D.   On: 11/08/2019 19:27   NM GASTRIC EMPTYING  Result Date: 11/08/2019 CLINICAL DATA:  Refractory nausea, vomiting and abdominal pain for weeks, weight loss EXAM: NUCLEAR MEDICINE GASTRIC EMPTYING SCAN TECHNIQUE: After oral ingestion of radiolabeled meal, sequential abdominal images were obtained for 120 minutes. Residual percentage of activity remaining within the stomach was calculated at 60 and 120 minutes. Patient was planned to be a 4 hour exam but she terminated the procedure at 2 hours, refusing to complete additional imaging. After oral ingestion of radiolabeled meal, sequential abdominal images were obtained for 4 hours. Percentage of activity emptying the stomach was calculated  at 1 hour, 2 hour, 3 hour, and 4 hours. RADIOPHARMACEUTICALS:  2 mCi Tc-44m sulfur colloid in standardized meal COMPARISON:   12/10/2018 FINDINGS: Expected location of the stomach in the left upper quadrant. Some retained contrast in distal esophagus versus hiatal hernia throughout exam. Ingested meal empties the stomach slowly over the course of the study. 3% emptying at 1 hour. 30% emptying at 2 hours. Findings represent delayed gastric emptying at 2 hours. IMPRESSION: Delayed gastric emptying at 2 hours. Patient refused completion of the final 2 hours of the 4 hour exam. Electronically Signed   By: Lavonia Dana M.D.   On: 11/08/2019 14:14     Assessment: 69 year old female admitted with hypoglycemia with history of chronic nausea/vomiting, abdominal pain, profound weight loss of greater than 150 pounds in the past 2 years (weight stable last 6 months), dysphagia to solids/pills, recent diagnosis of scleroderma.   N/V: GES only completed to 2 hours. Consistent with gastroparesis. Reglan increased from 5 mg to 7.5 mg TID before meals. Vomiting with chicken broth this morning. CT abdomen without contrast yesterday for assessment of anatomy prior to feeding tube placement with dilated small bowel loops, fluid filled. Clinically, does not seem to be consistent with bowel obstruction but very well could have developing ileus: denies abdominal pain, distension, +flatus. Need to follow clinically. Xray if worsening symptoms. Feeding tube unable to be safely placed, so general surgery has been consulted.   Pancreatic lesion: MRCP this admission markedly motion degraded. Will need CT abd with pancreatic protocol in May 2022 as she did not have a good quality study with MRI in setting of motion degradation.  Documentation in chart with concern for cirrhosis: known fatty liver. Spleen normal. No stigmata of portal hypertension on recent EGD. No thrombocytopenia. Likely dealing moreso with hepatic steatosis and does not appear to have advanced liver disease.   Plan: Continue clear liquids Further imaging if unable to tolerate liquids,  monitor for signs/symptoms of obstruction Continue Reglan at 7.5 mg TID before meals Surgery consult as PEG/PEJ not amenable to IR placement CT with pancreatic protocol May 2022   Annitta Needs, PhD, ANP-BC [facility]    LOS: 7 days    11/09/2019, 9:34 AM

## 2019-11-09 NOTE — Progress Notes (Signed)
Initial Nutrition Assessment  DOCUMENTATION CODES:   Not applicable  INTERVENTION:  Boost Breeze po TID, each supplement provides 250 kcal and 9 grams of protein  Will provide vanilla Ensure and CIB with diet advancement  Once J-tube is placed, recommend initiating  -Osmolite 1.5 @ 20 ml/hr, advance as tolerated 10 ml every 8 hours to goal rate 50 ml/hr with 30 ml prostat daily via tube. -200 ml free water flush every 6 hours  This regimen at goal rate will provide 1900 kcal, 90 grams of protein, and 912 ml free water (1712 ml total water with flushes)   NUTRITION DIAGNOSIS:   Inadequate oral intake related to altered GI function (gastroparesis) as evidenced by  (CLD >/= 5 days insufficient to meet needs).   GOAL:   Patient will meet greater than or equal to 90% of their needs    MONITOR:   Labs, I & O's, Diet advancement, Supplement acceptance, PO intake, Weight trends  REASON FOR ASSESSMENT:   NPO/Clear Liquid Diet    ASSESSMENT:   69 year old female admitted for hypoglycemia with past medical history significant for COPD, asthma with chronic respiratory failure on 3 L O2, HCOM, atrial fibrillation, liver cirrhosis, DM2, and recently diagnosed with scleroderma. Pt presented from endocrinologist's office with blood sugar of 32, given D50 and blood sugar increased to 47, but on ED arrival blood sugar was 20. Patient reports 3 month history of 2-3 daily episodes of vomiting, dysphagia to solids/pills, weight loss and chronic intermittent abdominal pain.  Patient is s/p GES consistent with gastroparesis. Patient presented to IR for percutaneous J-tube placement this morning, however CT abdomen showed dilated, fluid filled small bowel loops and feeding tube unable to be placed safely, surgery consulted. Will continue to monitor for J-tube placement, recommendations for tube feed regimen outlined above.   Patient sitting in bedside chair, 2 nursing students combing patient's  hair with other students in room at time of visit. Patient reports that she is tired of clear liquid diet and wants real food. Patient appears frustrated when RD inquired about nutrition history. She stated "why does everybody keep asking me what I eat, I eat normal food" Patient reports regular intake of oral supplements at home, reports drinking CIB as well as Ensure and likes vanilla. Per diet history, she has been on clear liquids since admission and has consumed 0-100% (73% average) x 8 documented intakes from 6/26-6/29. RD will order Boost Breeze, and provide Ensure as well as CIB when appropriate.   Mild pitting BLE edema per RN assessment  No new weights this admission, recommend obtaining current wt to assess trends. Per chart, weights have remained stable 145-155 lb over the past 6 months. Patient endorses significant 150 lb wt loss over the past 2 years, noted 74.3 kg on 07/19/18, 81.3 kg on 06/04/18, 101.6 kg on 01/29/18, and 130.2 kg on 10/19/17.   Medications reviewed and include: Lasix 20 mg IV twice, Reglan, Protonix, Prednisone, Calan IVF: D5 and NaCl with KCl 20 mEq/L @ 75 ml/hr Labs: CBGs 131,108,187,125 K - WNL 6/24 Mg - WNL  NUTRITION - FOCUSED PHYSICAL EXAM: Deferred to follow-up, receiving pt care this morning   Diet Order:   Diet Order            Diet clear liquid Room service appropriate? Yes; Fluid consistency: Thin  Diet effective now                 EDUCATION NEEDS:   Not appropriate  for education at this time  Skin:  Skin Assessment: Reviewed RN Assessment  Last BM:  6/30 type 7  Height:   Ht Readings from Last 1 Encounters:  11/01/19 5\' 8"  (1.727 m)    Weight:   Wt Readings from Last 1 Encounters:  11/01/19 69.6 kg    Ideal Body Weight:  63.6 kg  BMI:  Body mass index is 23.33 kg/m.  Estimated Nutritional Needs:   Kcal:  1800-2000  Protein:  90-100  Fluid:  >/= 1.8 L/day   Lajuan Lines, RD, LDN Clinical Nutrition After  Hours/Weekend Pager # [phone]

## 2019-11-10 ENCOUNTER — Encounter (HOSPITAL_COMMUNITY): Payer: Self-pay | Admitting: Anesthesiology

## 2019-11-10 DIAGNOSIS — K7460 Unspecified cirrhosis of liver: Secondary | ICD-10-CM

## 2019-11-10 DIAGNOSIS — I480 Paroxysmal atrial fibrillation: Secondary | ICD-10-CM

## 2019-11-10 DIAGNOSIS — I10 Essential (primary) hypertension: Secondary | ICD-10-CM

## 2019-11-10 DIAGNOSIS — R627 Adult failure to thrive: Secondary | ICD-10-CM | POA: Diagnosis present

## 2019-11-10 DIAGNOSIS — E119 Type 2 diabetes mellitus without complications: Secondary | ICD-10-CM

## 2019-11-10 LAB — HEPATIC FUNCTION PANEL
ALT: 8 U/L (ref 0–44)
AST: 11 U/L — ABNORMAL LOW (ref 15–41)
Albumin: 2 g/dL — ABNORMAL LOW (ref 3.5–5.0)
Alkaline Phosphatase: 73 U/L (ref 38–126)
Bilirubin, Direct: 0.2 mg/dL (ref 0.0–0.2)
Indirect Bilirubin: 0.5 mg/dL (ref 0.3–0.9)
Total Bilirubin: 0.7 mg/dL (ref 0.3–1.2)
Total Protein: 5.5 g/dL — ABNORMAL LOW (ref 6.5–8.1)

## 2019-11-10 LAB — BASIC METABOLIC PANEL
Anion gap: 9 (ref 5–15)
BUN: 19 mg/dL (ref 8–23)
CO2: 23 mmol/L (ref 22–32)
Calcium: 7.8 mg/dL — ABNORMAL LOW (ref 8.9–10.3)
Chloride: 102 mmol/L (ref 98–111)
Creatinine, Ser: 1.14 mg/dL — ABNORMAL HIGH (ref 0.44–1.00)
GFR calc Af Amer: 56 mL/min — ABNORMAL LOW (ref >60)
GFR calc non Af Amer: 49 mL/min — ABNORMAL LOW (ref >60)
Glucose, Bld: 145 mg/dL — ABNORMAL HIGH (ref 70–99)
Potassium: 3.4 mmol/L — ABNORMAL LOW (ref 3.5–5.1)
Sodium: 134 mmol/L — ABNORMAL LOW (ref 135–145)

## 2019-11-10 LAB — BRAIN NATRIURETIC PEPTIDE: B Natriuretic Peptide: 230 pg/mL — ABNORMAL HIGH (ref 0.0–100.0)

## 2019-11-10 LAB — CBC
HCT: 27.5 % — ABNORMAL LOW (ref 36.0–46.0)
Hemoglobin: 9.5 g/dL — ABNORMAL LOW (ref 12.0–15.0)
MCH: 29.3 pg (ref 26.0–34.0)
MCHC: 34.5 g/dL (ref 30.0–36.0)
MCV: 84.9 fL (ref 80.0–100.0)
Platelets: 244 10*3/uL (ref 150–400)
RBC: 3.24 MIL/uL — ABNORMAL LOW (ref 3.87–5.11)
RDW: 15.6 % — ABNORMAL HIGH (ref 11.5–15.5)
WBC: 6.7 10*3/uL (ref 4.0–10.5)
nRBC: 0 % (ref 0.0–0.2)

## 2019-11-10 LAB — PROTEIN / CREATININE RATIO, URINE
Creatinine, Urine: 33.69 mg/dL
Protein Creatinine Ratio: 0.12 mg/mg{Cre} (ref 0.00–0.15)
Total Protein, Urine: 4 mg/dL

## 2019-11-10 LAB — GLUCOSE, CAPILLARY
Glucose-Capillary: 122 mg/dL — ABNORMAL HIGH (ref 70–99)
Glucose-Capillary: 128 mg/dL — ABNORMAL HIGH (ref 70–99)
Glucose-Capillary: 131 mg/dL — ABNORMAL HIGH (ref 70–99)
Glucose-Capillary: 131 mg/dL — ABNORMAL HIGH (ref 70–99)
Glucose-Capillary: 136 mg/dL — ABNORMAL HIGH (ref 70–99)
Glucose-Capillary: 158 mg/dL — ABNORMAL HIGH (ref 70–99)

## 2019-11-10 NOTE — Progress Notes (Signed)
Subjective: Denies abdominal pain. Having excessive saliva/spitting. Had nausea and emesis x 2 after chicken broth. Doesn't like the broth. Had several bowel movements last night and this morning, soft/loose, no hematochezia or melena. Is frustrated with delays on tube placement. No other GI complaints.  Objective: Vital signs in last 24 hours: Temp:  [98.4 F (36.9 C)-99.7 F (37.6 C)] 99.7 F (37.6 C) (07/01 0424) Pulse Rate:  [71-87] 87 (07/01 0424) Resp:  [17] 17 (06/30 1354) BP: (91-116)/(60-78) 91/60 (07/01 0424) SpO2:  [88 %-100 %] 98 % (07/01 0853) Last BM Date: 11/10/19 General:   Sleepy but awakens to voice. Head:  Normocephalic and atraumatic. Eyes:  No icterus, sclera clear. Conjuctiva pink.  Neck:  Supple, without thyromegaly or masses.  Heart:  S1, S2 present, no murmurs noted.  Lungs: Clear to auscultation bilaterally, without wheezing, rales, or rhonchi.  Abdomen:  Bowel sounds present, soft, non-distended. Mild abdominal TTP. No HSM or hernias noted. No rebound or guarding. No masses appreciated  Msk:  Symmetrical without gross deformities. Pulses:  Normal bilateral DP pulses noted. Extremities:  Without clubbing or edema. Neurologic:  Alert and  oriented x4;  grossly normal neurologically. Psych:  Alert and cooperative. Normal mood and affect.  Intake/Output from previous day: 06/30 0701 - 07/01 0700 In: 720 [P.O.:720] Out: 900 [Urine:900] Intake/Output this shift: No intake/output data recorded.  Lab Results: Recent Labs    11/09/19 0459 11/10/19 0453  WBC 6.1 6.7  HGB 9.3* 9.5*  HCT 27.2* 27.5*  PLT 183 244   BMET Recent Labs    11/08/19 0444 11/09/19 0459 11/10/19 0453  NA 136 135 134*  K 4.0 4.0 3.4*  CL 102 102 102  CO2 27 24 23   GLUCOSE 127* 129* 145*  BUN 16 18 19   CREATININE 1.14* 1.19* 1.14*  CALCIUM 7.6* 7.8* 7.8*   LFT Recent Labs    11/08/19 0444 11/09/19 0459 11/10/19 0453  PROT 5.3* 5.5* 5.5*  ALBUMIN 2.0* 2.0*  2.0*  AST 12* 13* 11*  ALT 9 10 8   ALKPHOS 57 66 73  BILITOT 0.6 0.7 0.7  BILIDIR  --   --  0.2  IBILI  --   --  0.5   PT/INR No results for input(s): LABPROT, INR in the last 72 hours. Hepatitis Panel No results for input(s): HEPBSAG, HCVAB, HEPAIGM, HEPBIGM in the last 72 hours.   Studies/Results: CT ABDOMEN WO CONTRAST  Result Date: 11/08/2019 CLINICAL DATA:  Assess anatomy for possible G-tube placement EXAM: CT ABDOMEN WITHOUT CONTRAST TECHNIQUE: Multidetector CT imaging of the abdomen was performed following the standard protocol without IV contrast. COMPARISON:  09/19/2019 FINDINGS: Lower chest: Small bilateral pleural effusions. Coronary artery and aortic calcifications. Bronchiectasis in the lower lobes. Hepatobiliary: No focal hepatic abnormality. Gallbladder unremarkable. Pancreas: No focal abnormality or ductal dilatation. Spleen: No focal abnormality.  Normal size. 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 No hydronephrosis. No renal or adrenal mass. Stomach/Bowel: Small bowel loops are dilated and fluid-filled. Numerous small bowel loops as well as the colon overlie the stomach anteriorly and would make gastrostomy tube placement difficult. Diverticula seen within the colon. Vascular/Lymphatic: Aortic atherosclerosis. No enlarged abdominal lymph nodes. Other: No free fluid or free air. Musculoskeletal: No acute bony abnormality. IMPRESSION: Dilated, fluid-filled small bowel loops. Appearance is concerning for possible small bowel obstruction. Numerous small bowel loops and the colon lie anterior to the stomach and would make gastrostomy tube placement difficult. Small  bilateral pleural effusions.  Bibasilar bronchiectasis. Colonic diverticulosis. Electronically Signed   By: Rolm Baptise M.D.   On: 11/08/2019 19:27   NM GASTRIC EMPTYING  Result Date: 11/08/2019 CLINICAL DATA:  Refractory nausea, vomiting and abdominal pain  for weeks, weight loss EXAM: NUCLEAR MEDICINE GASTRIC EMPTYING SCAN TECHNIQUE: After oral ingestion of radiolabeled meal, sequential abdominal images were obtained for 120 minutes. Residual percentage of activity remaining within the stomach was calculated at 60 and 120 minutes. Patient was planned to be a 4 hour exam but she terminated the procedure at 2 hours, refusing to complete additional imaging. After oral ingestion of radiolabeled meal, sequential abdominal images were obtained for 4 hours. Percentage of activity emptying the stomach was calculated at 1 hour, 2 hour, 3 hour, and 4 hours. RADIOPHARMACEUTICALS:  2 mCi Tc-58m sulfur colloid in standardized meal COMPARISON:  12/10/2018 FINDINGS: Expected location of the stomach in the left upper quadrant. Some retained contrast in distal esophagus versus hiatal hernia throughout exam. Ingested meal empties the stomach slowly over the course of the study. 3% emptying at 1 hour. 30% emptying at 2 hours. Findings represent delayed gastric emptying at 2 hours. IMPRESSION: Delayed gastric emptying at 2 hours. Patient refused completion of the final 2 hours of the 4 hour exam. Electronically Signed   By: Lavonia Dana M.D.   On: 11/08/2019 14:14    Assessment: 69 year old female admitted with hypoglycemia with history of chronic nausea/vomiting, abdominal pain, profound weight loss of greater than 150 pounds in the past 2 years (weight stable last 6 months), dysphagia to solids/pills, recent diagnosis of scleroderma.   Nausea/Vomiting: GES only completed to 2 hours. Consistent with gastroparesis. Reglan increased from 5 mg to 7.5 mg TID before meals. Vomiting with chicken broth this morning again (doesn't like broth). CT abdomen without contrast yesterday for assessment of anatomy prior to feeding tube placement with dilated small bowel loops, fluid filled. Clinically, does not seem to be consistent with bowel obstruction but very well could have developing  ileus. Denies abdominal pain (though mildly tender on palpation). Some fullness, +flatus. Need to follow clinically. Xray if worsening symptoms. Feeding tube unable to be safely placed, so general surgery has been consulted.   Pancreatic lesion:MRCP this admission markedly motion degraded. Will need CT abd with pancreatic protocol in May 2022 as she did not have a good quality study with MRI in setting of motion degradation. Has been placed on recall list appropriately  Documentation in chart with concern for cirrhosis: Known fatty liver. Spleen normal. No stigmata of portal hypertension on recent EGD. No thrombocytopenia. Likely dealing moreso with hepatic steatosis and does not appear to have advanced liver disease.   The patient is frustrated with delays. I explained need for surgical placement versus IR and she verbalized understanding. She is familiar with Dr. Constance Haw and is happy to see her.  Plan: 1. Planned surgical visit today, possibly PEG/PEJ tomorrow 2. Continue clear liquids 3. Continue Reglan tid ac as ordered 4. Supportive measures   Thank you for allowing [place] to participate in the care of Melissa Hatchet, DNP, AGNP-C Adult & Gerontological Nurse Practitioner [facility]    LOS: 8 days    11/10/2019, 10:56 AM

## 2019-11-10 NOTE — Progress Notes (Addendum)
PROGRESS NOTE  [name] WNI:[id number] DOB: 1950/09/10 DOA: 11/01/2019 PCP: Maryruth Hancock, MD   Brief History:  69 y.o.femalewith medical history significant forCOPD asthma with chronic respiratory failure on 3 L, paroxysmal atrial fibrillation,HOCM,liver cirrhosis, diabetes mellitus. Patient was sent to the ED via EMS from her endocrinologist'soffice with blood sugar of 32,she was given D50 and blood sugar increased to 47, but on arrival to the ED, her blood sugar was 20.  Patient has had profound weight loss of greater than 150 pounds in the past 2 years (weight stable last 6 months), dysphagia to solids/pills, recent diagnosis of scleroderma.  Over the past 3 months she has been vomiting every day, at least 2-3 times a day, she has been vomiting almostevery thing shetries to eat,withresulted in weight loss. She reports chronic intermittent abdominal pain. But she has no abdominal pain today. No loose stools. Patient is not on any medications for her diabetes.  She has chronic lower extremity swelling that that fluctuates but is currently unchanged,no chest pain, no difficulty breathing.  Assessment/Plan: Hypoglycemia/FTT/Intractable Nausea/vomiting -Dr. Roderic Palau discussed with endocrine Dr. Dorris Fetch. Felt that hypoglycemia is related to severe malnutrition and depleted glycogen stores. Recommendations were to keep the patient on dextrose infusion while in the hospital. Also, keep gloves on patient's hands to help keep them warm. This may improve accuracy of CBG. He recommended holding off on further work-up for insulinoma in current setting since results may be skewed. This will be further followed up as an outpatient. Overall blood sugars are better with dextrose infusion.Need to ensure that patient is getting adequate enteral nutrition prior to discontinuing dextrose infusion.  Intractable nausea/vomiting -worsen last 3 months -related to severe  reflux esophagitis and gastroparesis -05/19/19--EGD--severe reflux esophagitis -GES--confirms gastroparesis -reglan increased to 7.5 mg tid -GI following and assisting with further work-up -Dr. Laural Golden agrees with PEG/PEJ -IR consulted-->anatomy not conducive for PEC -still vomiting with broth -consult general surgery--planning open G-tube if cleared by cardiology  Diabetes Mellitus type 2 -11/01/19 A1c--5.0 -no on any agents as outpt -hypoglycemic epidoses improved on IV dextrose infusion  Dilated SB loops -likely ileus, not clinically obstructed -passing flatus; had small BM this afternoon  Chronic respiratory failure with hypoxia -continue duonebs -normally on 3L at home  Pancreatic lesion: -MRCP this admission markedly motion degraded. Will need CT abd with pancreatic protocol in May 2022 as she did not have a good quality study with MRI in setting of motion degradation.  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  Unable to tolerate po. Still vomiting.  Open gtube planned   Dispo: The patient is from: Home  Anticipated d/c is to: Home  Anticipated d/c date is: 2 days  Patient currently is not medically stable to d/c.        Family Communication:  no Family at bedside  Consultants:  GI, general surgery, cardiology  Code Status:  DNR  DVT Prophylaxis:  [place] Heparin    Procedures: As Listed in Progress Note Above  Antibiotics: None  Subjective: Pt had emesis again today after chicken broth.  Denies cp, sob, diarrhea.  She is passing flatus.  No  f/c or abd pain  Objective: Vitals:   11/09/19 2007 11/10/19 0424 11/10/19 0853 11/10/19 1345  BP: 116/78 91/60  99/65  Pulse: 80 87  82  Resp:    19  Temp: 99.3 F (37.4 C) 99.7 F (37.6 C)  99.1 F (37.3 C)  TempSrc: Oral Oral  Oral  SpO2: 100% 99% 98% 100%  Weight:      Height:       No intake or output data in the 24 hours ending 11/10/19 1712 Weight change:  Exam:   General:  Pt is alert, follows commands appropriately, not in acute distress  HEENT: No icterus, No thrush, No neck mass, [place]/AT  Cardiovascular: RRR, S1/S2, no rubs, no gallops  Respiratory: bibasilar crackles. No wheeze  Abdomen: Soft/+BS, non tender, non distended, no guarding  Extremities: 1+Le edema, No lymphangitis, No petechiae, No rashes, no synovitis   Data Reviewed: I have personally reviewed following labs and imaging studies Basic Metabolic Panel: Recent Labs  Lab 11/06/19 0518 11/06/19 0518 11/06/19 0355 11/07/19 0437 11/08/19 0444 11/09/19 0459 11/10/19 0453  NA 135  --   --  136 136 135 134*  K 4.1  --   --  4.2 4.0 4.0 3.4*  CL 101  --   --  100 102 102 102  CO2 27  --   --  28 27 24 23   GLUCOSE 111*   < > 177* 129* 127* 129* 145*  BUN 18  --   --  16 16 18 19   CREATININE 1.09*  --   --  1.09* 1.14* 1.19* 1.14*  CALCIUM 7.9*  --   --  7.9* 7.6* 7.8* 7.8*   < > = values in this interval not displayed.   Liver Function Tests: Recent Labs  Lab 11/06/19 0518 11/07/19 0437 11/08/19 0444 11/09/19 0459 11/10/19 0453  AST 13* 11* 12* 13* 11*  ALT 9 9 9 10 8   ALKPHOS 56 54 57 66 73  BILITOT 0.6 0.7 0.6 0.7 0.7  PROT 5.6* 5.2* 5.3* 5.5* 5.5*  ALBUMIN 2.4* 2.2* 2.0* 2.0* 2.0*   No results for input(s): LIPASE, AMYLASE in the last 168 hours. No results for input(s): AMMONIA in the last 168 hours. Coagulation Profile: No results for input(s): INR, PROTIME in the last 168 hours. CBC: Recent Labs  Lab 11/07/19 0437 11/09/19 0459 11/10/19 0453    WBC 4.2 6.1 6.7  HGB 9.2* 9.3* 9.5*  HCT 28.3* 27.2* 27.5*  MCV 87.3 86.3 84.9  PLT 242 183 244   Cardiac Enzymes: No results for input(s): CKTOTAL, CKMB, CKMBINDEX, TROPONINI in the last 168 hours. BNP: Invalid input(s): POCBNP CBG: Recent Labs  Lab 11/10/19 0015 11/10/19 0425 11/10/19 0732 11/10/19 1116 11/10/19 1507  GLUCAP 158* 136* 131* 128* 131*   HbA1C: No results for input(s): HGBA1C in the last 72 hours. Urine analysis:    Component Value Date/Time   COLORURINE YELLOW 08/17/2019 [place] 08/17/2019 1134   LABSPEC 1.006 08/17/2019 1134   PHURINE 7.0 08/17/2019 1134   GLUCOSEU NEGATIVE 08/17/2019 1134   GLUCOSEU NEGATIVE 11/30/2013 1057   HGBUR NEGATIVE 08/17/2019 1134   BILIRUBINUR NEGATIVE 12/09/2018 0923   [place] 08/17/2019 1134   PROTEINUR NEGATIVE 08/17/2019 1134   UROBILINOGEN 0.2 11/30/2013 1057   NITRITE NEGATIVE 08/17/2019 1134   LEUKOCYTESUR NEGATIVE 08/17/2019 1134   Sepsis Labs: @LABRCNTIP (procalcitonin:4,lacticidven:4) ) Recent Results (from the past 240 hour(s))  SARS Coronavirus 2 by RT PCR (hospital order, performed in  [facility] hospital lab) Nasopharyngeal Nasopharyngeal Swab     Status: None   Collection Time: 11/01/19  3:16 PM   Specimen: Nasopharyngeal Swab  Result Value Ref Range Status   SARS Coronavirus 2 NEGATIVE NEGATIVE Final    Comment: (NOTE) SARS-CoV-2 target nucleic acids are NOT DETECTED.  The SARS-CoV-2 RNA is generally detectable in upper and lower respiratory specimens during the acute phase of infection. The lowest concentration of SARS-CoV-2 viral copies this assay can detect is 250 copies / mL. A negative result does not preclude SARS-CoV-2 infection and should not be used as the sole basis for treatment or other patient management decisions.  A negative result may occur with improper specimen collection / handling, submission of specimen other than nasopharyngeal swab, presence of  viral mutation(s) within the areas targeted by this assay, and inadequate number of viral copies (<250 copies / mL). A negative result must be combined with clinical observations, patient history, and epidemiological information.  Fact Sheet for Patients:   StrictlyIdeas.no  Fact Sheet for Healthcare Providers: BankingDealers.co.za  This test is not yet approved or  cleared by the [place] FDA and has been authorized for detection and/or diagnosis of SARS-CoV-2 by FDA under an Emergency Use Authorization (EUA).  This EUA will remain in effect (meaning this test can be used) for the duration of the COVID-19 declaration under Section 564(b)(1) of the Act, 21 U.S.C. section 360bbb-3(b)(1), unless the authorization is terminated or revoked sooner.  Performed at [facility], [address] [place], [place] [address]      Scheduled Meds: . aspirin EC  81 mg Oral Daily  . carvedilol  25 mg Oral BID  . feeding supplement  1 Container Oral TID BM  . furosemide  20 mg Intravenous BID  . heparin  5,000 Units Subcutaneous Q8H  . latanoprost  1 drop Both Eyes QHS  . levothyroxine  88 mcg Oral QAC breakfast  . metoCLOPramide (REGLAN) injection  7.5 mg Intravenous TID AC  . mometasone-formoterol  2 puff Inhalation BID  . pantoprazole (PROTONIX) IV  40 mg Intravenous Q12H  . predniSONE  5 mg Oral Daily  . verapamil  120 mg Oral Daily   Continuous Infusions: . dextrose 5 % and 0.9 % NaCl with KCl 20 mEq/L 75 mL/hr at 11/09/19 2130    Procedures/Studies: CT ABDOMEN WO CONTRAST  Result Date: 11/08/2019 CLINICAL DATA:  Assess anatomy for possible G-tube placement EXAM: CT ABDOMEN WITHOUT CONTRAST TECHNIQUE: Multidetector CT imaging of the abdomen was performed following the standard protocol without IV contrast. COMPARISON:  09/19/2019 FINDINGS: Lower chest: Small bilateral pleural effusions. Coronary artery and aortic calcifications.  Bronchiectasis in the lower lobes. Hepatobiliary: No focal hepatic abnormality. Gallbladder unremarkable. Pancreas: No focal abnormality or ductal dilatation. Spleen: No focal abnormality.  Normal size. 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 No hydronephrosis. No renal or adrenal mass. Stomach/Bowel: Small bowel loops are dilated and fluid-filled. Numerous small bowel loops as well as the colon overlie the stomach anteriorly and would make gastrostomy tube placement difficult. Diverticula seen within the colon. Vascular/Lymphatic: Aortic atherosclerosis. No enlarged abdominal lymph nodes. Other: No free fluid or free air. Musculoskeletal: No acute bony abnormality. IMPRESSION: Dilated, fluid-filled small bowel loops. Appearance is concerning for possible small bowel obstruction. Numerous small bowel loops and the colon lie anterior to the stomach and would make gastrostomy tube placement difficult. Small bilateral pleural effusions.  Bibasilar bronchiectasis. Colonic diverticulosis. Electronically Signed  By: Rolm Baptise M.D.   On: 11/08/2019 19:27   NM GASTRIC EMPTYING  Result Date: 11/08/2019 CLINICAL DATA:  Refractory nausea, vomiting and abdominal pain for weeks, weight loss EXAM: NUCLEAR MEDICINE GASTRIC EMPTYING SCAN TECHNIQUE: After oral ingestion of radiolabeled meal, sequential abdominal images were obtained for 120 minutes. Residual percentage of activity remaining within the stomach was calculated at 60 and 120 minutes. Patient was planned to be a 4 hour exam but she terminated the procedure at 2 hours, refusing to complete additional imaging. After oral ingestion of radiolabeled meal, sequential abdominal images were obtained for 4 hours. Percentage of activity emptying the stomach was calculated at 1 hour, 2 hour, 3 hour, and 4 hours. RADIOPHARMACEUTICALS:  2 mCi Tc-17m sulfur colloid in standardized meal COMPARISON:  12/10/2018  FINDINGS: Expected location of the stomach in the left upper quadrant. Some retained contrast in distal esophagus versus hiatal hernia throughout exam. Ingested meal empties the stomach slowly over the course of the study. 3% emptying at 1 hour. 30% emptying at 2 hours. Findings represent delayed gastric emptying at 2 hours. IMPRESSION: Delayed gastric emptying at 2 hours. Patient refused completion of the final 2 hours of the 4 hour exam. Electronically Signed   By: Lavonia Dana M.D.   On: 11/08/2019 14:14   MR 3D Recon At Scanner  Result Date: 11/03/2019 CLINICAL DATA:  Cystic lesions in the pancreas seen on CT scan. EXAM: MRI ABDOMEN WITHOUT AND WITH CONTRAST (INCLUDING MRCP) TECHNIQUE: Multiplanar multisequence MR imaging of the abdomen was performed both before and after the administration of intravenous contrast. Heavily T2-weighted images of the biliary and pancreatic ducts were obtained, and three-dimensional MRCP images were rendered by post processing. CONTRAST:  41mL GADAVIST GADOBUTROL 1 MMOL/ML IV SOLN COMPARISON:  Abdominal CT 09/19/2019 FINDINGS: Markedly motion degraded study. Lower chest: Probable small right pleural effusion. Hepatobiliary: No gross abnormality within the liver parenchyma. Assessment of liver parenchyma on postcontrast imaging is motion degraded. 6 mm probable gallstone noted in the dependent gallbladder lumen. Common bile duct measures 5 mm diameter in the head of pancreas. Pancreas: 7 mm T2 hyperintense focus identified in the head of pancreas, similar to recent CT. This lesion is obscured by motion artifact on postcontrast imaging. The remaining tiny lesions seen in the pancreas on previous CT are also obscured. No dilatation of the main duct. Spleen:  No gross abnormality. Adrenals/Urinary Tract: No gross enhancing adrenal or renal mass lesion. No evidence for hydronephrosis. 13 mm probable cyst upper pole right kidney with scattered tiny T2 hyperintensities in both kidneys  too small to characterize but likely benign. Stomach/Bowel: Nondilated. Diffuse distension of fluid-filled bowel seen throughout the abdomen. Small bowel loops measure up to 3.9 cm diameter. Vascular/Lymphatic: No abdominal aortic aneurysm. No discernible lymphadenopathy in the abdomen. Other:  No substantial intraperitoneal free fluid. Musculoskeletal: No suspicious marrow enhancement within the visualized bony anatomy. IMPRESSION: 1. Markedly motion degraded study. Exam is nondiagnostic for small lesions in the solid abdominal organs. 2. 7 mm T2 hyperintense focus in the head of pancreas, similar to recent CT. This lesion is obscured on postcontrast imaging. The remaining tiny lesions seen in the pancreas on previous CT are also obscured by motion artifact. Given patient inability to reproducibly breath hold and remain still, using CT with its better temporal resolution for follow-up is recommended. For cystic lesions of this size in a patient of this age, consensus guidelines recommend repeat imaging every 2 years. This recommendation follows ACR consensus guidelines: Management  of Incidental Pancreatic Cysts: A White Paper of the ACR Incidental Findings Committee. [place] [id number]. 3. Cholelithiasis. No biliary dilatation. 4. Diffuse distension of fluid-filled bowel loops in the abdomen. Small bowel obstruction cannot be excluded. Electronically Signed   By: Misty Stanley M.D.   On: 11/03/2019 09:23   MR ABDOMEN MRCP W WO CONTAST  Result Date: 11/03/2019 CLINICAL DATA:  Cystic lesions in the pancreas seen on CT scan. EXAM: MRI ABDOMEN WITHOUT AND WITH CONTRAST (INCLUDING MRCP) TECHNIQUE: Multiplanar multisequence MR imaging of the abdomen was performed both before and after the administration of intravenous contrast. Heavily T2-weighted images of the biliary and pancreatic ducts were obtained, and three-dimensional MRCP images were rendered by post processing. CONTRAST:  90mL GADAVIST  GADOBUTROL 1 MMOL/ML IV SOLN COMPARISON:  Abdominal CT 09/19/2019 FINDINGS: Markedly motion degraded study. Lower chest: Probable small right pleural effusion. Hepatobiliary: No gross abnormality within the liver parenchyma. Assessment of liver parenchyma on postcontrast imaging is motion degraded. 6 mm probable gallstone noted in the dependent gallbladder lumen. Common bile duct measures 5 mm diameter in the head of pancreas. Pancreas: 7 mm T2 hyperintense focus identified in the head of pancreas, similar to recent CT. This lesion is obscured by motion artifact on postcontrast imaging. The remaining tiny lesions seen in the pancreas on previous CT are also obscured. No dilatation of the main duct. Spleen:  No gross abnormality. Adrenals/Urinary Tract: No gross enhancing adrenal or renal mass lesion. No evidence for hydronephrosis. 13 mm probable cyst upper pole right kidney with scattered tiny T2 hyperintensities in both kidneys too small to characterize but likely benign. Stomach/Bowel: Nondilated. Diffuse distension of fluid-filled bowel seen throughout the abdomen. Small bowel loops measure up to 3.9 cm diameter. Vascular/Lymphatic: No abdominal aortic aneurysm. No discernible lymphadenopathy in the abdomen. Other:  No substantial intraperitoneal free fluid. Musculoskeletal: No suspicious marrow enhancement within the visualized bony anatomy. IMPRESSION: 1. Markedly motion degraded study. Exam is nondiagnostic for small lesions in the solid abdominal organs. 2. 7 mm T2 hyperintense focus in the head of pancreas, similar to recent CT. This lesion is obscured on postcontrast imaging. The remaining tiny lesions seen in the pancreas on previous CT are also obscured by motion artifact. Given patient inability to reproducibly breath hold and remain still, using CT with its better temporal resolution for follow-up is recommended. For cystic lesions of this size in a patient of this age, consensus guidelines recommend  repeat imaging every 2 years. This recommendation follows ACR consensus guidelines: Management of Incidental Pancreatic Cysts: A White Paper of the ACR Incidental Findings Committee. [place] [id number]. 3. Cholelithiasis. No biliary dilatation. 4. Diffuse distension of fluid-filled bowel loops in the abdomen. Small bowel obstruction cannot be excluded. Electronically Signed   By: Misty Stanley M.D.   On: 11/03/2019 09:23    Orson Eva, DO  [facility]  If 7PM-7AM, please contact night-coverage [email or web address] Password [email or web address] 11/10/2019, 5:12 PM   LOS: 8 days

## 2019-11-10 NOTE — Plan of Care (Signed)
  Problem: Education: Goal: Knowledge of General Education information will improve Description: Including pain rating scale, medication(s)/side effects and non-pharmacologic comfort measures Outcome: Progressing   Problem: Clinical Measurements: Goal: Diagnostic test results will improve Outcome: Progressing   

## 2019-11-10 NOTE — Consult Note (Addendum)
[place]  Reason for Consult: Failure to thrive, malnutrition/ hypoglycemia  Referring Physician:  Dr. Carles Collet   Chief Complaint    Hypoglycemia      HPI: [name] is a 69 y.o. female with COPD on 3L, paroxysmal A fib, HOCM, cirrhosis, DM, scleroderm versus lupus which has never been clear on my review of her Rheumatology notes from an outside system, on chronic prednisone and over 100 lb weight loss in the last 18 months.  I have seen multiple times in the past for question of cholelithiasis versus cholecystitis causing her symptoms which every time I see her consist of epigastric pain from eating and regurgitation of solid foods. She has severe esophageal dysmotility documented on esophagram, gastroparesis confirmed this admission.  She was admitted on 6/22 with hypoglycemia to 22 and severe malnutrition. She has intractable vomiting and nausea and cannot keep sufficient food day. She has been followed by GI and they have requested possible distal feeding tube.   When I discussed this with the Hospitalist eariler this week I felt that the least invasive and best option might be a IR guided GJ tube placement versus PEG placement by me and ultimate exchange to [place] by IR as they are able to do this via a Pull PEG 20-24 french system.     CT done to assess the patient's anatomy has revealed a very posterior stomach with colon and small intestine lying over the stomach and lack of percutaneous window. IR is unable to perform anything but also PEG would also be unsuccessful as the stomach is behind so much intestine and it would be dangerous to attempt.  CT also questions dilated bowel concerning for obstruction but clinically she is eating and having frequent BMs per her report, at most she has an ileus.  The patient is currently on liquids and having Bms. She D5NS to maintain her BS.     Past Medical History:  Diagnosis Date  . Anterolisthesis    Grade 1, L4-5   . Bronchospasm 05/28/2012  . COPD (chronic obstructive pulmonary disease) ([place])   . DEGENERATIVE JOINT DISEASE 10/06/2006  . DEPRESSION 09/26/2008  . DIABETES MELLITUS 10/06/2006  . Diastolic heart failure ([place])    Echo 10/2018 LVEF 55-60% with impaired relaxation.  . Diverticulosis    [id number]  . GERD (gastroesophageal reflux disease) 07/25/2013  . Glaucoma   . HOCM (hypertrophic obstructive cardiomyopathy) ([place])   . HYPERLIPIDEMIA 01/11/2009  . HYPERTENSION 10/06/2006  . Hypokalemia   . Hypomagnesemia   . Hypothyroidism   . INSOMNIA 09/26/2008  . Internal hemorrhoids   . OBSTRUCTIVE SLEEP APNEA 06/23/2008   Severe OSA per sleep study 2010, Rx a CPAP  . Pain in joint, multiple sites 11/10/2006  . Pericardial effusion   . Pericarditis   . UNSPECIFIED ANEMIA 12/10/2009  . UTI (urinary tract infection) 11/2017    Past Surgical History:  Procedure Laterality Date  . BIOPSY  12/28/2017   Procedure: BIOPSY;  Surgeon: Rush Landmark Telford Nab., MD;  Location: [facility];  Service: Gastroenterology;;  . BIOPSY  05/19/2019   Procedure: BIOPSY;  Surgeon: Daneil Dolin, MD;  [facility];  Service: Endoscopy;;  gastric  . COLONOSCOPY  08/01/2011   Procedure: COLONOSCOPY;  Surgeon: Inda Castle, MD;  Location: [facility];  Service: Endoscopy;  Laterality: N/A;. hyperplastic polyp removed, hemorrhoids s/p banding, diverticulosis, next tcs 10 years.   . ESOPHAGOGASTRODUODENOSCOPY (EGD) WITH PROPOFOL N/A 12/28/2017   Procedure: ESOPHAGOGASTRODUODENOSCOPY (EGD) WITH  PROPOFOL;  Surgeon: Irving Copas., MD;  Location: [facility];  Service: Gastroenterology;  Laterality: N/A; h.pylori gastiris, tortuous esophagus with esophageal bx (no EOE), medium sized hiatal hernia  . ESOPHAGOGASTRODUODENOSCOPY (EGD) WITH PROPOFOL N/A 05/19/2019   Procedure: ESOPHAGOGASTRODUODENOSCOPY (EGD) WITH PROPOFOL; severe reflux esophagitis, medium size hiatal hernia, gastric erosions with biopsies positive  for mild chronic gastritis and negative for H. pylori.  No dilation performed.  Marland Kitchen LEFT HEART CATH AND CORONARY ANGIOGRAPHY N/A 10/22/2017   Procedure: LEFT HEART CATH AND CORONARY ANGIOGRAPHY;  Surgeon: Jettie Booze, MD;  Location: [facility];  Service: Cardiovascular;  Laterality: N/A;  . 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 Right knee replacement  2005    Family History  Problem Relation Age of Onset  . Asthma Mother   . Stroke Mother   . Diabetes Mother   . Diabetes Other        M, B, S  . Hypertension Sister        M, S,B  . Pancreatic cancer Brother   . Colon cancer Neg Hx   . Prostate cancer Neg Hx   . Breast cancer Neg Hx   . Esophageal cancer Neg Hx   . Liver disease Neg Hx   . Rectal cancer Neg Hx   . Stomach cancer Neg Hx   . Inflammatory bowel disease Neg Hx     Social History   Tobacco Use  . Smoking status: Former Smoker    Packs/day: 0.20    Years: 4.00    Pack years: 0.80    Types: Cigarettes    Quit date: 05/13/1995    Years since quitting: 24.5  . Smokeless tobacco: Never Used  Vaping Use  . Vaping Use: Never used  Substance Use Topics  . Alcohol use: Not Currently  . Drug use: No    Medications: I have reviewed the patient's current medications. Current Facility-Administered Medications  Medication Dose Route Frequency Provider Last Rate Last Admin  . acetaminophen (TYLENOL) tablet 650 mg  650 mg Oral Q6H PRN Emokpae, Ejiroghene E, MD   650 mg at 11/09/19 0539   Or  . acetaminophen (TYLENOL) suppository 650 mg  650 mg Rectal Q6H PRN Emokpae, Ejiroghene E, MD      . aspirin EC tablet 81 mg  81 mg Oral Daily Emokpae, Ejiroghene E, MD   81 mg at 11/10/19 0900  . carvedilol (COREG) tablet 25 mg  25 mg Oral BID Emokpae, Ejiroghene E, MD   25 mg at 11/10/19 0900  . dextrose 5 % and 0.9 % NaCl with KCl 20 mEq/L infusion   Intravenous Continuous Kathie Dike, MD 75 mL/hr at 11/09/19 2130 New Bag at 11/09/19 2130  . feeding supplement (BOOST  / RESOURCE BREEZE) liquid 1 Container  1 Container Oral TID BM Orson Eva, MD   1 Container at 11/10/19 1321  . furosemide (LASIX) injection 20 mg  20 mg Intravenous BID Kathie Dike, MD   20 mg at 11/10/19 1753  . heparin injection 5,000 Units  5,000 Units Subcutaneous Q8H Emokpae, Ejiroghene E, MD   5,000 Units at 11/10/19 1322  . ipratropium-albuterol (DUONEB) 0.5-2.5 (3) MG/3ML nebulizer solution 3 mL  3 mL Nebulization Q4H PRN Emokpae, Ejiroghene E, MD      . latanoprost (XALATAN) 0.005 % ophthalmic solution 1 drop  1 drop Both Eyes QHS Emokpae, Ejiroghene E, MD   1 drop at 11/09/19 2134  . levothyroxine (SYNTHROID) tablet 88 mcg  88 mcg  Oral QAC breakfast Emokpae, Ejiroghene E, MD   88 mcg at 11/10/19 0630  . metoCLOPramide (REGLAN) injection 7.5 mg  7.5 mg Intravenous TID AC Rehman, Najeeb U, MD   7.5 mg at 11/10/19 1752  . mometasone-formoterol (DULERA) 200-5 MCG/ACT inhaler 2 puff  2 puff Inhalation BID Emokpae, Ejiroghene E, MD   2 puff at 11/10/19 0853  . ondansetron (ZOFRAN) tablet 4 mg  4 mg Oral Q6H PRN Emokpae, Ejiroghene E, MD       Or  . ondansetron (ZOFRAN) injection 4 mg  4 mg Intravenous Q6H PRN Emokpae, Ejiroghene E, MD   4 mg at 11/06/19 0825  . pantoprazole (PROTONIX) injection 40 mg  40 mg Intravenous Q12H Rogene Houston, MD   40 mg at 11/10/19 0858  . polyethylene glycol (MIRALAX / GLYCOLAX) packet 17 g  17 g Oral Daily PRN Emokpae, Ejiroghene E, MD      . predniSONE (DELTASONE) tablet 5 mg  5 mg Oral Daily Emokpae, Ejiroghene E, MD   5 mg at 11/10/19 0900  . verapamil (CALAN) tablet 120 mg  120 mg Oral Daily Emokpae, Ejiroghene E, MD   120 mg at 11/10/19 0900    No Known Allergies   ROS:  A comprehensive review of systems was negative except for: Constitutional: positive for 100+ lb weight loss in 18 months Respiratory: positive for SOB, COPD/ 3L O2 Gastrointestinal: positive for abdominal pain, diarrhea, nausea, vomiting and regurgitation of  food Musculoskeletal: positive for weak frail    Blood pressure 99/65, pulse 82, temperature 99.1 F (37.3 C), temperature source Oral, resp. rate 19, height 5\' 8"  (1.727 m), weight 69.6 kg, SpO2 100 %. Physical Exam Vitals reviewed.  Constitutional:      General: She is not in acute distress.    Appearance: She is cachectic.  HENT:     Head: Normocephalic.     Mouth/Throat:     Mouth: Mucous membranes are moist.  Eyes:     Extraocular Movements: Extraocular movements intact.  Cardiovascular:     Rate and Rhythm: Normal rate.  Pulmonary:     Effort: Pulmonary effort is normal.  Abdominal:     General: There is no distension.     Palpations: Abdomen is soft.     Tenderness: There is no abdominal tenderness.  Musculoskeletal:        General: No swelling.     Cervical back: No rigidity.  Skin:    General: Skin is warm.  Neurological:     General: No focal deficit present.     Mental Status: She is alert and oriented to person, place, and time.  Psychiatric:        Mood and Affect: Mood normal.        Behavior: Behavior normal.        Thought Content: Thought content normal.        Judgment: Judgment normal.     Results: Results for orders placed or performed during the hospital encounter of 11/01/19 (from the past 48 hour(s))  Glucose, capillary     Status: Abnormal   Collection Time: 11/08/19  7:53 PM  Result Value Ref Range   Glucose-Capillary 121 (H) 70 - 99 mg/dL    Comment: Glucose reference range applies only to samples taken after fasting for at least 8 hours.  Glucose, capillary     Status: Abnormal   Collection Time: 11/08/19 11:39 PM  Result Value Ref Range   Glucose-Capillary 122 (H) 70 - 99  mg/dL    Comment: Glucose reference range applies only to samples taken after fasting for at least 8 hours.  Glucose, capillary     Status: Abnormal   Collection Time: 11/09/19  3:33 AM  Result Value Ref Range   Glucose-Capillary 125 (H) 70 - 99 mg/dL    Comment:  Glucose reference range applies only to samples taken after fasting for at least 8 hours.  CBC     Status: Abnormal   Collection Time: 11/09/19  4:59 AM  Result Value Ref Range   WBC 6.1 4.0 - 10.5 K/uL   RBC 3.15 (L) 3.87 - 5.11 MIL/uL   Hemoglobin 9.3 (L) 12.0 - 15.0 g/dL   HCT 27.2 (L) 36 - 46 %   MCV 86.3 80.0 - 100.0 fL   MCH 29.5 26.0 - 34.0 pg   MCHC 34.2 30.0 - 36.0 g/dL   RDW 16.3 (H) 11.5 - 15.5 %   Platelets 183 150 - 400 K/uL   nRBC 0.0 0.0 - 0.2 %    Comment: Performed at [facility], [address] [place], [place] [address]  Comprehensive metabolic panel     Status: Abnormal   Collection Time: 11/09/19  4:59 AM  Result Value Ref Range   Sodium 135 135 - 145 mmol/L   Potassium 4.0 3.5 - 5.1 mmol/L   Chloride 102 98 - 111 mmol/L   CO2 24 22 - 32 mmol/L   Glucose, Bld 129 (H) 70 - 99 mg/dL    Comment: Glucose reference range applies only to samples taken after fasting for at least 8 hours.   BUN 18 8 - 23 mg/dL   Creatinine, Ser 1.19 (H) 0.44 - 1.00 mg/dL   Calcium 7.8 (L) 8.9 - 10.3 mg/dL   Total Protein 5.5 (L) 6.5 - 8.1 g/dL   Albumin 2.0 (L) 3.5 - 5.0 g/dL   AST 13 (L) 15 - 41 U/L   ALT 10 0 - 44 U/L   Alkaline Phosphatase 66 38 - 126 U/L   Total Bilirubin 0.7 0.3 - 1.2 mg/dL   GFR calc non Af Amer 46 (L) >60 mL/min   GFR calc Af Amer 54 (L) >60 mL/min   Anion gap 9 5 - 15    Comment: Performed at [facility], [address] [place], [place] [address]  Glucose, capillary     Status: Abnormal   Collection Time: 11/09/19  7:38 AM  Result Value Ref Range   Glucose-Capillary 187 (H) 70 - 99 mg/dL    Comment: Glucose reference range applies only to samples taken after fasting for at least 8 hours.  Glucose, capillary     Status: Abnormal   Collection Time: 11/09/19 11:19 AM  Result Value Ref Range   Glucose-Capillary 108 (H) 70 - 99 mg/dL    Comment: Glucose reference range applies only to samples taken after fasting for at least 8 hours.  Glucose,  capillary     Status: Abnormal   Collection Time: 11/09/19  4:33 PM  Result Value Ref Range   Glucose-Capillary 131 (H) 70 - 99 mg/dL    Comment: Glucose reference range applies only to samples taken after fasting for at least 8 hours.  Glucose, capillary     Status: Abnormal   Collection Time: 11/09/19  8:08 PM  Result Value Ref Range   Glucose-Capillary 139 (H) 70 - 99 mg/dL    Comment: Glucose reference range applies only to samples taken after fasting for at least 8 hours.  Glucose, capillary     Status: Abnormal   Collection Time: 11/10/19 12:15 AM  Result Value Ref Range   Glucose-Capillary 158 (H) 70 - 99 mg/dL    Comment: Glucose reference range applies only to samples taken after fasting for at least 8 hours.  Glucose, capillary     Status: Abnormal   Collection Time: 11/10/19  4:25 AM  Result Value Ref Range   Glucose-Capillary 136 (H) 70 - 99 mg/dL    Comment: Glucose reference range applies only to samples taken after fasting for at least 8 hours.  Brain natriuretic peptide     Status: Abnormal   Collection Time: 11/10/19  4:53 AM  Result Value Ref Range   B Natriuretic Peptide 230.0 (H) 0.0 - 100.0 pg/mL    Comment: Performed at [facility], [address], [place], [place] [address]  Basic metabolic panel     Status: Abnormal   Collection Time: 11/10/19  4:53 AM  Result Value Ref Range   Sodium 134 (L) 135 - 145 mmol/L   Potassium 3.4 (L) 3.5 - 5.1 mmol/L   Chloride 102 98 - 111 mmol/L   CO2 23 22 - 32 mmol/L   Glucose, Bld 145 (H) 70 - 99 mg/dL    Comment: Glucose reference range applies only to samples taken after fasting for at least 8 hours.   BUN 19 8 - 23 mg/dL   Creatinine, Ser 1.14 (H) 0.44 - 1.00 mg/dL   Calcium 7.8 (L) 8.9 - 10.3 mg/dL   GFR calc non Af Amer 49 (L) >60 mL/min   GFR calc Af Amer 56 (L) >60 mL/min   Anion gap 9 5 - 15    Comment: Performed at [facility], [address] [place], [place] [address]  Hepatic function panel     Status:  Abnormal   Collection Time: 11/10/19  4:53 AM  Result Value Ref Range   Total Protein 5.5 (L) 6.5 - 8.1 g/dL   Albumin 2.0 (L) 3.5 - 5.0 g/dL   AST 11 (L) 15 - 41 U/L   ALT 8 0 - 44 U/L   Alkaline Phosphatase 73 38 - 126 U/L   Total Bilirubin 0.7 0.3 - 1.2 mg/dL   Bilirubin, Direct 0.2 0.0 - 0.2 mg/dL   Indirect Bilirubin 0.5 0.3 - 0.9 mg/dL    Comment: Performed at [facility], [address] [place], [place] [address]  CBC     Status: Abnormal   Collection Time: 11/10/19  4:53 AM  Result Value Ref Range   WBC 6.7 4.0 - 10.5 K/uL   RBC 3.24 (L) 3.87 - 5.11 MIL/uL   Hemoglobin 9.5 (L) 12.0 - 15.0 g/dL   HCT 27.5 (L) 36 - 46 %   MCV 84.9 80.0 - 100.0 fL   MCH 29.3 26.0 - 34.0 pg   MCHC 34.5 30.0 - 36.0 g/dL   RDW 15.6 (H) 11.5 - 15.5 %   Platelets 244 150 - 400 K/uL   nRBC 0.0 0.0 - 0.2 %    Comment: Performed at [facility], [address] [place], [place] [address]  Glucose, capillary     Status: Abnormal   Collection Time: 11/10/19  7:32 AM  Result Value Ref Range   Glucose-Capillary 131 (H) 70 - 99 mg/dL    Comment: Glucose reference range applies only to samples taken after fasting for at least 8 hours.  Glucose, capillary     Status: Abnormal   Collection Time: 11/10/19 11:16  AM  Result Value Ref Range   Glucose-Capillary 128 (H) 70 - 99 mg/dL    Comment: Glucose reference range applies only to samples taken after fasting for at least 8 hours.  Glucose, capillary     Status: Abnormal   Collection Time: 11/10/19  3:07 PM  Result Value Ref Range   Glucose-Capillary 131 (H) 70 - 99 mg/dL    Comment: Glucose reference range applies only to samples taken after fasting for at least 8 hours.   *Note: Due to a large number of results and/or encounters for the requested time period, some results have not been displayed. A complete set of results can be found in Results Review.   Personally reviewed- dilated loops but no obvious transition, colon and SB in front of  stomach CT ABDOMEN WO CONTRAST  Result Date: 11/08/2019 CLINICAL DATA:  Assess anatomy for possible G-tube placement EXAM: CT ABDOMEN WITHOUT CONTRAST TECHNIQUE: Multidetector CT imaging of the abdomen was performed following the standard protocol without IV contrast. COMPARISON:  09/19/2019 FINDINGS: Lower chest: Small bilateral pleural effusions. Coronary artery and aortic calcifications. Bronchiectasis in the lower lobes. Hepatobiliary: No focal hepatic abnormality. Gallbladder unremarkable. Pancreas: No focal abnormality or ductal dilatation. Spleen: No focal abnormality.  Normal size. 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 No hydronephrosis. No renal or adrenal mass. Stomach/Bowel: Small bowel loops are dilated and fluid-filled. Numerous small bowel loops as well as the colon overlie the stomach anteriorly and would make gastrostomy tube placement difficult. Diverticula seen within the colon. Vascular/Lymphatic: Aortic atherosclerosis. No enlarged abdominal lymph nodes. Other: No free fluid or free air. Musculoskeletal: No acute bony abnormality. IMPRESSION: Dilated, fluid-filled small bowel loops. Appearance is concerning for possible small bowel obstruction. Numerous small bowel loops and the colon lie anterior to the stomach and would make gastrostomy tube placement difficult. Small bilateral pleural effusions.  Bibasilar bronchiectasis. Colonic diverticulosis. Electronically Signed   By: Rolm Baptise M.D.   On: 11/08/2019 19:27    Assessment & Plan:  [name] is a 69 y.o. female with failure to thrive, malnutrition/ hypoglycemia who I have been requested to place a feeding tube in distally given her esophageal dysmotility and gastroparesis. In the past I have been asked to remove her gallbladder and a possible liver biopsy given her cirrhosis on imaging.   I have spoken with the patient multiple times in the past regarding the  risk of cholecystectomy versus the benefits and the likelihood that this would not improve any of her symptoms. At those times we opted to not do a cholecystectomy as her comorbidities and risk of complications are high.    Today I have spoken with her and her cousin, [name] who takes her to appointments. We discussed feeding access for malnutrition, hypoglycemia/ failure to thrive. We discussed that the least invasive option of PEG to PEJ is not possible due to her anatomy. We discussed that an open feeding tube would be needed, and that if we did general anesthesia and an open feeding tube that we could do a cholecystectomy at that time given the request in the past and concern it could be contributing to her issues. Discussed the risk of not healing from her steroids, risk of not being extubated and needing a breathing tube, risk of needing a tracheostomy if she cannot be extubated, risk of wound healing issues, bleeding issues, and risk of the procedures not changing the trajectory and malnutrition or altering  her prognosis. I attempted to call her son Kerry Dory but only was able to leave a message [phone]).   Given her HOCM and overall frailty, I discussed the case with Anesthesia, and asked Cardiology to risk stratify the patient as Dr. Lovena Le cardiology was leery of even doing a Pacemaker in her.   I discussed the case with Dr. Laural Golden and the possibility of a feeding tube distally either J tube or GJ tube as this would be easy to maintain and safer for the patient and the potential for cholecystectomy if an open procedure is possible.   Overall the patient's prognosis is poor and there are real risk of bleeding, wound healing issues from her chronic prednisone and nutritional status, risk of complications from a feeding tube and cholecystectomy, risk of inability to extubate and need for tracheostomy which she reports she would want.  She is DNR but says she wants all treatment options  possible.    Will see what further discussion and cardiology recommendations say, and if we decide to proceed it will be next week. Discussed with Dr. Carles Collet.   All questions were answered to the satisfaction of the patient and family.  Greater than 50% of the 70 minute visit was spent in counseling/ coordination of care regarding the options of surgery and the risk as stated above.    Virl Cagey 11/10/2019, 5:33 PM

## 2019-11-11 ENCOUNTER — Encounter (HOSPITAL_COMMUNITY)
Admission: EM | Disposition: A | Discharge status: Hospice | Payer: Self-pay | Source: Ambulatory Visit | Attending: Internal Medicine

## 2019-11-11 DIAGNOSIS — Z01810 Encounter for preprocedural cardiovascular examination: Secondary | ICD-10-CM

## 2019-11-11 DIAGNOSIS — I422 Other hypertrophic cardiomyopathy: Secondary | ICD-10-CM

## 2019-11-11 DIAGNOSIS — R627 Adult failure to thrive: Secondary | ICD-10-CM

## 2019-11-11 DIAGNOSIS — J449 Chronic obstructive pulmonary disease, unspecified: Secondary | ICD-10-CM

## 2019-11-11 LAB — GLUCOSE, CAPILLARY
Glucose-Capillary: 115 mg/dL — ABNORMAL HIGH (ref 70–99)
Glucose-Capillary: 95 mg/dL (ref 70–99)
Glucose-Capillary: 85 mg/dL (ref 70–99)
Glucose-Capillary: 128 mg/dL — ABNORMAL HIGH (ref 70–99)
Glucose-Capillary: 128 mg/dL — ABNORMAL HIGH (ref 70–99)
Glucose-Capillary: 144 mg/dL — ABNORMAL HIGH (ref 70–99)

## 2019-11-11 SURGERY — ESOPHAGOGASTRODUODENOSCOPY (EGD) WITH PROPOFOL
Anesthesia: Choice

## 2019-11-11 NOTE — Plan of Care (Signed)
°  Problem: Education: Goal: Knowledge of General Education information will improve Description: Including pain rating scale, medication(s)/side effects and non-pharmacologic comfort measures Outcome: Progressing   Problem: Clinical Measurements: Goal: Diagnostic test results will improve 11/11/2019 1309 by Santa Lighter, RN Outcome: Progressing 11/11/2019 0901 by Santa Lighter, RN Outcome: Progressing

## 2019-11-11 NOTE — Progress Notes (Signed)
PROGRESS NOTE  [name] OZH:[id number] DOB: 1950-08-11 DOA: 11/01/2019 PCP: Maryruth Hancock, MD  Brief History: 70 y.o.femalewith medical history significant forCOPD asthma with chronic respiratory failure on 3 L, paroxysmal atrial fibrillation,HOCM,liver cirrhosis, diabetes mellitus. Patient was sent to the ED via EMS from her endocrinologist'soffice with blood sugar of 32,she was given D50 and blood sugar increased to 47, but on arrival to the ED, her blood sugar was 20.Patient has hadprofound weight loss of greater than 150 pounds in the past 2 years (weight stable last 6 months), dysphagia to solids/pills, recent diagnosis of scleroderma.  Over the past 3 months she has been vomiting every day, at least 2-3 times a day, she has been vomiting almostevery thing shetries to eat,withresulted in weight loss. She reports chronic intermittent abdominal pain. But she has no abdominal pain today. No loose stools. Patient is not on any medications for her diabetes.  She has chronic lower extremity swelling that that fluctuates but is currently unchanged,no chest pain, no difficulty breathing.  Assessment/Plan: Hypoglycemia/FTT/Intractable Nausea/vomiting -Dr. Roderic Palau discussed with endocrineDr. Dorris Fetch. Felt that hypoglycemia is related to severe malnutrition and depleted glycogen stores. Recommendations were to keep the patient on dextrose infusion while in the hospital. Also, keep gloves on patient's hands to help keep them warm. This may improve accuracy of CBG. He recommended holding off on further work-up for insulinoma in current setting since results may be skewed. This will be further followed up as an outpatient. Overall blood sugars are better with dextrose infusion.Need to ensure that patient is getting adequate enteral nutrition prior to discontinuing dextrose infusion.  Intractable nausea/vomiting -worsen last 3 months -related to severe  reflux esophagitis and gastroparesis -05/19/19--EGD--severe reflux esophagitis -GES--confirms gastroparesis -reglan increased to 7.5 mg tid -GI following and assisting with further work-up -Dr. Laural Golden agrees with PEG/PEJ -IR consulted-->anatomy not conducive for PEC -pt continues to vomiting and having difficulty tolerating clear liquids -consult general surgery--planning open G-tube if cleared by cardiology  Hypertrophic CM -appreciate cardiology consult-->From a purely cardiac standpoint there is no contraindication to her surgery, defer considerations regarding her noncardiac comorbidities to primary team. No additional cardaic testing planned at this time.  Diabetes Mellitus type 2 -11/01/19 A1c--5.0 -no on any agents as outpt -hypoglycemic epidoses improved on IV dextrose infusion  Dilated SB loops -likely ileus, not clinically obstructed -passing flatus; had small BM this afternoon  Chronic respiratory failure with hypoxia -continue duonebs -normally on 3L at home  Pancreatic lesion: -MRCP this admission markedly motion degraded. Will need CT abd with pancreatic protocol in May 2022 as she did not have a good quality study with MRI in setting of motion degradation.  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 Unable to tolerate po. Still vomiting.  Surgical Open gtube planned   Dispo: The patient is from:Home Anticipated d/c is GE:X[facility]W Anticipated d/c date is: 3 days Patient currently is not medically stable to  d/c.        Family Communication:noFamily at bedside  Consultants:GI, general surgery, cardiology  Code Status: DNR  DVT Prophylaxis: [place] Heparin    Procedures: As Listed in Progress Note Above  Antibiotics:  None     Subjective: Pt continues to vomit intermittently with clear liquids.  Denies f/c, cp, sob abd pain, dysuria  Objective: Vitals:   11/10/19 2033 11/11/19 0505 11/11/19 0751 11/11/19 1300  BP:  107/72  110/67  Pulse:  86  81  Resp:  18  16  Temp:  100.3 F (37.9 C)  99 F (37.2 C)  TempSrc:  Oral  Oral  SpO2: 99%  98% 99%  Weight:      Height:        Intake/Output Summary (Last 24 hours) at 11/11/2019 1655 Last data filed at 11/11/2019 1640 Gross per 24 hour  Intake 6228.05 ml  Output 2200 ml  Net 4028.05 ml   Weight change:  Exam:   General:  Pt is alert, follows commands appropriately, not in acute distress  HEENT: No icterus, No thrush, No neck mass, [place]/AT  Cardiovascular: RRR, S1/S2, no rubs, no gallops  Respiratory: CTA bilaterally, no wheezing, no crackles, no rhonchi  Abdomen: Soft/+BS, non tender, non distended, no guarding  Extremities: 1+LE edema, No lymphangitis, No petechiae, No rashes, no synovitis   Data Reviewed: I have personally reviewed following labs and imaging studies Basic Metabolic Panel: Recent Labs  Lab 11/06/19 0518 11/06/19 0518 11/06/19 5537 11/07/19 0437 11/08/19 0444 11/09/19 0459 11/10/19 0453  NA 135  --   --  136 136 135 134*  K 4.1  --   --  4.2 4.0 4.0 3.4*  CL 101  --   --  100 102 102 102  CO2 27  --   --  28 27 24 23   GLUCOSE 111*   < > 177* 129* 127* 129* 145*  BUN 18  --   --  16 16 18 19   CREATININE 1.09*  --   --  1.09* 1.14* 1.19* 1.14*  CALCIUM 7.9*  --   --  7.9* 7.6* 7.8* 7.8*   < > = values in this interval not displayed.   Liver Function Tests: Recent Labs  Lab 11/06/19 0518 11/07/19 0437 11/08/19 0444 11/09/19 0459 11/10/19 0453  AST 13* 11* 12*  13* 11*  ALT 9 9 9 10 8   ALKPHOS 56 54 57 66 73  BILITOT 0.6 0.7 0.6 0.7 0.7  PROT 5.6* 5.2* 5.3* 5.5* 5.5*  ALBUMIN 2.4* 2.2* 2.0* 2.0* 2.0*   No results for input(s): LIPASE, AMYLASE in the last 168 hours. No results for input(s): AMMONIA in the last 168 hours. Coagulation Profile: No results for input(s): INR, PROTIME in the last 168 hours. CBC: Recent Labs  Lab 11/07/19 0437 11/09/19 0459 11/10/19 0453  WBC 4.2 6.1 6.7  HGB 9.2* 9.3* 9.5*  HCT 28.3* 27.2* 27.5*  MCV 87.3 86.3 84.9  PLT 242 183 244   Cardiac Enzymes: No results for input(s): CKTOTAL, CKMB, CKMBINDEX, TROPONINI in the last 168 hours. BNP: Invalid input(s): POCBNP CBG: Recent Labs  Lab 11/10/19 2009 11/11/19 0100 11/11/19 0423 11/11/19 0752 11/11/19 1132  GLUCAP 122* 115* 95 85 128*   HbA1C: No results for input(s): HGBA1C in the last 72 hours. Urine analysis:    Component Value Date/Time   COLORURINE YELLOW 08/17/2019 [place] 08/17/2019 1134   LABSPEC 1.006 08/17/2019 [place] 7.0 08/17/2019 1134   GLUCOSEU NEGATIVE 08/17/2019 1134   GLUCOSEU NEGATIVE 11/30/2013 1057   [place] 08/17/2019 1134   [place] 12/09/2018 [place] 08/17/2019 1134   PROTEINUR NEGATIVE 08/17/2019 1134   UROBILINOGEN  0.2 11/30/2013 1057   NITRITE NEGATIVE 08/17/2019 1134   LEUKOCYTESUR NEGATIVE 08/17/2019 1134   Sepsis Labs: @LABRCNTIP (procalcitonin:4,lacticidven:4) )No results found for this or any previous visit (from the past 240 hour(s)).   Scheduled Meds: . aspirin EC  81 mg Oral Daily  . carvedilol  25 mg Oral BID  . feeding supplement  1 Container Oral TID BM  . furosemide  20 mg Intravenous BID  . heparin  5,000 Units Subcutaneous Q8H  . latanoprost  1 drop Both Eyes QHS  . levothyroxine  88 mcg Oral QAC breakfast  . metoCLOPramide (REGLAN) injection  7.5 mg Intravenous TID AC  . mometasone-formoterol  2 puff Inhalation BID  . pantoprazole  (PROTONIX) IV  40 mg Intravenous Q12H  . predniSONE  5 mg Oral Daily  . verapamil  120 mg Oral Daily   Continuous Infusions: . dextrose 5 % and 0.9 % NaCl with KCl 20 mEq/L 75 mL/hr at 11/11/19 1640    Procedures/Studies: CT ABDOMEN WO CONTRAST  Result Date: 11/08/2019 CLINICAL DATA:  Assess anatomy for possible G-tube placement EXAM: CT ABDOMEN WITHOUT CONTRAST TECHNIQUE: Multidetector CT imaging of the abdomen was performed following the standard protocol without IV contrast. COMPARISON:  09/19/2019 FINDINGS: Lower chest: Small bilateral pleural effusions. Coronary artery and aortic calcifications. Bronchiectasis in the lower lobes. Hepatobiliary: No focal hepatic abnormality. Gallbladder unremarkable. Pancreas: No focal abnormality or ductal dilatation. Spleen: No focal abnormality.  Normal size. 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 No hydronephrosis. No renal or adrenal mass. Stomach/Bowel: Small bowel loops are dilated and fluid-filled. Numerous small bowel loops as well as the colon overlie the stomach anteriorly and would make gastrostomy tube placement difficult. Diverticula seen within the colon. Vascular/Lymphatic: Aortic atherosclerosis. No enlarged abdominal lymph nodes. Other: No free fluid or free air. Musculoskeletal: No acute bony abnormality. IMPRESSION: Dilated, fluid-filled small bowel loops. Appearance is concerning for possible small bowel obstruction. Numerous small bowel loops and the colon lie anterior to the stomach and would make gastrostomy tube placement difficult. Small bilateral pleural effusions.  Bibasilar bronchiectasis. Colonic diverticulosis. Electronically Signed   By: Rolm Baptise M.D.   On: 11/08/2019 19:27   NM GASTRIC EMPTYING  Result Date: 11/08/2019 CLINICAL DATA:  Refractory nausea, vomiting and abdominal pain for weeks, weight loss EXAM: NUCLEAR MEDICINE GASTRIC EMPTYING SCAN TECHNIQUE: After  oral ingestion of radiolabeled meal, sequential abdominal images were obtained for 120 minutes. Residual percentage of activity remaining within the stomach was calculated at 60 and 120 minutes. Patient was planned to be a 4 hour exam but she terminated the procedure at 2 hours, refusing to complete additional imaging. After oral ingestion of radiolabeled meal, sequential abdominal images were obtained for 4 hours. Percentage of activity emptying the stomach was calculated at 1 hour, 2 hour, 3 hour, and 4 hours. RADIOPHARMACEUTICALS:  2 mCi Tc-72m sulfur colloid in standardized meal COMPARISON:  12/10/2018 FINDINGS: Expected location of the stomach in the left upper quadrant. Some retained contrast in distal esophagus versus hiatal hernia throughout exam. Ingested meal empties the stomach slowly over the course of the study. 3% emptying at 1 hour. 30% emptying at 2 hours. Findings represent delayed gastric emptying at 2 hours. IMPRESSION: Delayed gastric emptying at 2 hours. Patient refused completion of the final 2 hours of the 4 hour exam. Electronically Signed   By: Lavonia Dana M.D.   On: 11/08/2019 14:14   MR 3D Recon At Scanner  Result Date:  11/03/2019 CLINICAL DATA:  Cystic lesions in the pancreas seen on CT scan. EXAM: MRI ABDOMEN WITHOUT AND WITH CONTRAST (INCLUDING MRCP) TECHNIQUE: Multiplanar multisequence MR imaging of the abdomen was performed both before and after the administration of intravenous contrast. Heavily T2-weighted images of the biliary and pancreatic ducts were obtained, and three-dimensional MRCP images were rendered by post processing. CONTRAST:  27mL GADAVIST GADOBUTROL 1 MMOL/ML IV SOLN COMPARISON:  Abdominal CT 09/19/2019 FINDINGS: Markedly motion degraded study. Lower chest: Probable small right pleural effusion. Hepatobiliary: No gross abnormality within the liver parenchyma. Assessment of liver parenchyma on postcontrast imaging is motion degraded. 6 mm probable gallstone noted  in the dependent gallbladder lumen. Common bile duct measures 5 mm diameter in the head of pancreas. Pancreas: 7 mm T2 hyperintense focus identified in the head of pancreas, similar to recent CT. This lesion is obscured by motion artifact on postcontrast imaging. The remaining tiny lesions seen in the pancreas on previous CT are also obscured. No dilatation of the main duct. Spleen:  No gross abnormality. Adrenals/Urinary Tract: No gross enhancing adrenal or renal mass lesion. No evidence for hydronephrosis. 13 mm probable cyst upper pole right kidney with scattered tiny T2 hyperintensities in both kidneys too small to characterize but likely benign. Stomach/Bowel: Nondilated. Diffuse distension of fluid-filled bowel seen throughout the abdomen. Small bowel loops measure up to 3.9 cm diameter. Vascular/Lymphatic: No abdominal aortic aneurysm. No discernible lymphadenopathy in the abdomen. Other:  No substantial intraperitoneal free fluid. Musculoskeletal: No suspicious marrow enhancement within the visualized bony anatomy. IMPRESSION: 1. Markedly motion degraded study. Exam is nondiagnostic for small lesions in the solid abdominal organs. 2. 7 mm T2 hyperintense focus in the head of pancreas, similar to recent CT. This lesion is obscured on postcontrast imaging. The remaining tiny lesions seen in the pancreas on previous CT are also obscured by motion artifact. Given patient inability to reproducibly breath hold and remain still, using CT with its better temporal resolution for follow-up is recommended. For cystic lesions of this size in a patient of this age, consensus guidelines recommend repeat imaging every 2 years. This recommendation follows ACR consensus guidelines: Management of Incidental Pancreatic Cysts: A White Paper of the ACR Incidental Findings Committee. [place] 4097;35:329-924. 3. Cholelithiasis. No biliary dilatation. 4. Diffuse distension of fluid-filled bowel loops in the abdomen. Small  bowel obstruction cannot be excluded. Electronically Signed   By: Misty Stanley M.D.   On: 11/03/2019 09:23   MR ABDOMEN MRCP W WO CONTAST  Result Date: 11/03/2019 CLINICAL DATA:  Cystic lesions in the pancreas seen on CT scan. EXAM: MRI ABDOMEN WITHOUT AND WITH CONTRAST (INCLUDING MRCP) TECHNIQUE: Multiplanar multisequence MR imaging of the abdomen was performed both before and after the administration of intravenous contrast. Heavily T2-weighted images of the biliary and pancreatic ducts were obtained, and three-dimensional MRCP images were rendered by post processing. CONTRAST:  28mL GADAVIST GADOBUTROL 1 MMOL/ML IV SOLN COMPARISON:  Abdominal CT 09/19/2019 FINDINGS: Markedly motion degraded study. Lower chest: Probable small right pleural effusion. Hepatobiliary: No gross abnormality within the liver parenchyma. Assessment of liver parenchyma on postcontrast imaging is motion degraded. 6 mm probable gallstone noted in the dependent gallbladder lumen. Common bile duct measures 5 mm diameter in the head of pancreas. Pancreas: 7 mm T2 hyperintense focus identified in the head of pancreas, similar to recent CT. This lesion is obscured by motion artifact on postcontrast imaging. The remaining tiny lesions seen in the pancreas on previous CT are also obscured.  No dilatation of the main duct. Spleen:  No gross abnormality. Adrenals/Urinary Tract: No gross enhancing adrenal or renal mass lesion. No evidence for hydronephrosis. 13 mm probable cyst upper pole right kidney with scattered tiny T2 hyperintensities in both kidneys too small to characterize but likely benign. Stomach/Bowel: Nondilated. Diffuse distension of fluid-filled bowel seen throughout the abdomen. Small bowel loops measure up to 3.9 cm diameter. Vascular/Lymphatic: No abdominal aortic aneurysm. No discernible lymphadenopathy in the abdomen. Other:  No substantial intraperitoneal free fluid. Musculoskeletal: No suspicious marrow enhancement within the  visualized bony anatomy. IMPRESSION: 1. Markedly motion degraded study. Exam is nondiagnostic for small lesions in the solid abdominal organs. 2. 7 mm T2 hyperintense focus in the head of pancreas, similar to recent CT. This lesion is obscured on postcontrast imaging. The remaining tiny lesions seen in the pancreas on previous CT are also obscured by motion artifact. Given patient inability to reproducibly breath hold and remain still, using CT with its better temporal resolution for follow-up is recommended. For cystic lesions of this size in a patient of this age, consensus guidelines recommend repeat imaging every 2 years. This recommendation follows ACR consensus guidelines: Management of Incidental Pancreatic Cysts: A White Paper of the ACR Incidental Findings Committee. [place] 0258;52:778-242. 3. Cholelithiasis. No biliary dilatation. 4. Diffuse distension of fluid-filled bowel loops in the abdomen. Small bowel obstruction cannot be excluded. Electronically Signed   By: Misty Stanley M.D.   On: 11/03/2019 09:23    Orson Eva, DO  [facility]  If 7PM-7AM, please contact night-coverage [email or web address] Password [email or web address] 11/11/2019, 4:55 PM   LOS: 9 days

## 2019-11-11 NOTE — Progress Notes (Signed)
[facility]  Doing fair. Cardiology says low risk from cardiac standpoint and HCM but agrees risk given her frail nature, nutritional status etc.    BP 110/67 (BP Location: Right Arm)   Pulse 81   Temp 99 F (37.2 C) (Oral)   Resp 16   Ht 5\' 8"  (1.727 m)   Wt 69.6 kg   SpO2 99%   BMI 23.33 kg/m  NAD Normal work breathing, 3L Soft, non-distended MAE  Discussed again option of open gastrojejunostomy feeding tube versus jejunostomy tube and cholecystectomy, +/- liver biopsy at the time of surgery. Discussed risk of healing, extubation, need for prolonged intubation, ? Trach, risk of infection, bleeding, and that the operation may not change the symptoms or help with her failure to thrive.   Will talk with her and Kerry Dory further this weekend. Potentially OR Wednesday.  Curlene Labrum, MD [facility] [address] [place], [place] [address] [phone] (office)

## 2019-11-11 NOTE — Consult Note (Addendum)
Cardiology Consult    Patient ID: [name]; [id number]; Nov 23, 1950   Admit date: 11/01/2019 Date of Consult: 11/11/2019  Primary Care Provider: Maryruth Hancock, MD Primary Cardiologist: Melissa Him, MD  Primary Electrophysiologist: Dr. Lovena Le  Patient Profile    [name] is a 69 y.o. female with past medical history of HOCM, history of pericarditis (diagnosed in 10/2018), scleroderma, HTN, HLD, OSA (on CPAP), COPD, anemia, GERD, hepatic cirrhosis and normal cors by cath in 10/2017 who is being seen today for the evaluation of preoperative cardiac clearance at the request of Dr. Constance Haw and Dr. Carles Collet   History of Present Illness    [name] was last examined by Dr. Radford Pax in 08/2019 and she had recently been diagnosed with scleroderma in the setting of significant weight loss and had been prescribed Colchicine and Prednisone by Rheumatology. She had reported an unintentional weight loss of 150 pounds within the past 2-3 years. She reported having chronic dyspnea on exertion and was using 2 L nasal cannula at baseline. She was also using a walker for ambulation. She was continued on her current cardiac medications at that time including Amlodipine 10 mg daily, Coreg 25 mg twice daily, Lasix 20 mg daily and Crestor 40 mg daily. She was referred back to Dr. Lovena Le later that month to further discuss ICD as primary prevention in the setting of HOCM but given her multiple comorbidities, he did not recommend an ICD at that time. It was felt that Amlodipine was contributing to her lower extremity edema and this was discontinued and transitioned to Verapamil 120mg  daily. She did have a repeat echocardiogram in 09/2019 which showed moderate asymmetric basal septal hypertrophy of the basal septum up to 1.6 cm and suggestive of sigmoid variant HCM. EF was preserved at 55 to 60% with no regional wall motion abnormalities. She did have Grade 1 DD and trivial MR.    She initially presented to  [facility] ED on 11/01/2019 after being found to have significant hypoglycemia with glucose at 32 when checked by Endocrinology earlier in the day. Was at 67 upon arrival to the ED. She reported having frequent vomiting every day and at least 2-3 times each day. She was therefore admitted for further management of her intractable vomiting and hypoglycemia. Albumin was 1.8 on admission.She was followed by GI at the time of admission and it was felt her nausea and vomiting were likely secondary to gastroparesis versus severe esophagitis. MRCP was performed on 11/03/2019 and was a motion degraded exam and overall nondiagnostic but she did have a 7 mm lesion in the head of the pancreas which is similar to prior imaging with follow-up imaging recommended. She underwent a gastric emptying study on 11/08/2019 which was consistent with delayed gastric emptying.   General Surgery was consulted and initially it was felt a GJ tube or PEG would be best but given her posterior stomach, this would be a dangerous attempt by review of their notes. At this point, it looks like an open feeding tube has been recommended and likely cholecystectomy at that time given her gallbladder issues in the past.   In talking with the patient today, she will open her eyes on occasion but typically keeps them closed during our conversation. Says she is tired of a liquid diet and is hungry. She denies any specific exertional chest pain but reports having pain when consuming liquids and food comes back up. Denies any specific orthopnea, PND, edema or palpitations. Not  active at baseline and typically watches television a majority of the day. Uses a walker for ambulation.   Past Medical History:  Diagnosis Date  . Anterolisthesis    Grade 1, L4-5  . Bronchospasm 05/28/2012  . COPD (chronic obstructive pulmonary disease) ([place])   . DEGENERATIVE JOINT DISEASE 10/06/2006  . DEPRESSION 09/26/2008  . DIABETES MELLITUS 10/06/2006  . Diastolic heart  failure ([place])    Echo 10/2018 LVEF 55-60% with impaired relaxation.  . Diverticulosis    1700,1749  . GERD (gastroesophageal reflux disease) 07/25/2013  . Glaucoma   . HOCM (hypertrophic obstructive cardiomyopathy) ([place])   . HYPERLIPIDEMIA 01/11/2009  . HYPERTENSION 10/06/2006  . Hypokalemia   . Hypomagnesemia   . Hypothyroidism   . INSOMNIA 09/26/2008  . Internal hemorrhoids   . OBSTRUCTIVE SLEEP APNEA 06/23/2008   Severe OSA per sleep study 2010, Rx a CPAP  . Pain in joint, multiple sites 11/10/2006  . Pericardial effusion   . Pericarditis   . UNSPECIFIED ANEMIA 12/10/2009  . UTI (urinary tract infection) 11/2017    Past Surgical History:  Procedure Laterality Date  . BIOPSY  12/28/2017   Procedure: BIOPSY;  Surgeon: Rush Landmark Telford Nab., MD;  Location: [place];  Service: Gastroenterology;;  . BIOPSY  05/19/2019   Procedure: BIOPSY;  Surgeon: Daneil Dolin, MD;  [facility];  Service: Endoscopy;;  gastric  . COLONOSCOPY  08/01/2011   Procedure: COLONOSCOPY;  Surgeon: Inda Castle, MD;  Location: [facility];  Service: Endoscopy;  Laterality: N/A;. hyperplastic polyp removed, hemorrhoids s/p banding, diverticulosis, next tcs 10 years.   . ESOPHAGOGASTRODUODENOSCOPY (EGD) WITH PROPOFOL N/A 12/28/2017   Procedure: ESOPHAGOGASTRODUODENOSCOPY (EGD) WITH PROPOFOL;  Surgeon: Rush Landmark Telford Nab., MD;  Location: [facility];  Service: Gastroenterology;  Laterality: N/A; h.pylori gastiris, tortuous esophagus with esophageal bx (no EOE), medium sized hiatal hernia  . ESOPHAGOGASTRODUODENOSCOPY (EGD) WITH PROPOFOL N/A 05/19/2019   Procedure: ESOPHAGOGASTRODUODENOSCOPY (EGD) WITH PROPOFOL; severe reflux esophagitis, medium size hiatal hernia, gastric erosions with biopsies positive for mild chronic gastritis and negative for H. pylori.  No dilation performed.  Marland Kitchen LEFT HEART CATH AND CORONARY ANGIOGRAPHY N/A 10/22/2017   Procedure: LEFT HEART CATH AND CORONARY ANGIOGRAPHY;   Surgeon: Jettie Booze, MD;  Location: [facility];  Service: Cardiovascular;  Laterality: N/A;  . 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 Right knee replacement  2005     Home Medications:  Prior to Admission medications   Medication Sig Start Date End Date Taking? Authorizing Provider  albuterol (VENTOLIN HFA) 108 (90 Base) MCG/ACT inhaler Inhale 2 puffs into the lungs every 4 (four) hours as needed for wheezing or shortness of breath. 10/28/18  Yes Paz, Alda Berthold, MD  alum & mag hydroxide-simeth (MAALOX/MYLANTA) 200-200-20 MG/5ML suspension Take 30 mLs by mouth every 4 (four) hours as needed for indigestion. 12/11/18  Yes Manuella Ghazi, Pratik D, DO  aspirin EC 81 MG tablet Take 1 tablet (81 mg total) by mouth daily. 08/13/18  Yes Paz, Alda Berthold, MD  carvedilol (COREG) 25 MG tablet Take 1 tablet (25 mg total) by mouth 2 (two) times daily. Patient taking differently: Take 25 mg by mouth in the morning and at bedtime.  12/24/18 11/01/19 Yes Dunn, Nedra Hai, PA-C  Cholecalciferol 50 MCG (2000 UT) CAPS Take 1 capsule (2,000 Units total) by mouth daily with breakfast. 05/24/19  Yes Nida, Marella Chimes, MD  colchicine 0.6 MG tablet Take 0.6 mg by mouth daily.   Yes [provider]  dexlansoprazole (DEXILANT) 60 MG capsule Take 1 capsule (60 mg total) by mouth daily. 10/17/19  Yes Harper, Kristen S, PA-C  Fluticasone-Salmeterol (WIXELA INHUB) 250-50 MCG/DOSE AEPB Inhale 1 puff into the lungs 2 (two) times daily. 10/28/18  Yes [provider]  furosemide (LASIX) 20 MG tablet Take 1 tablet (20 mg total) by mouth daily. 06/28/19  Yes Imogene Burn, PA-C  latanoprost (XALATAN) 0.005 % ophthalmic solution Place 1 drop into both eyes at bedtime.   Yes [provider]  levothyroxine (SYNTHROID) 88 MCG tablet Take 1 tablet (88 mcg total) by mouth daily before breakfast. 04/28/19  Yes Nida, Marella Chimes, MD  metoCLOPramide (REGLAN) 5 MG tablet Take 5 mg by mouth 3 (three) times daily as needed  for nausea or vomiting.  10/29/19  Yes [provider]  OXYGEN Inhale 2 L into the lungs daily as needed (shortness of breath).    Yes [provider]  Pancrelipase, Lip-Prot-Amyl, (ZENPEP) 25000-79000 units CPEP Take 2 capsules (50,000 units) with breakfast lunch and dinner.  Take 1 capsule (25,000 units) with snacks up to twice daily. Patient taking differently: Take 1-2 capsules by mouth See admin instructions. Take 2 capsules (50,000 units) with breakfast lunch and dinner.  Take 1 capsule (25,000 units) with snacks up to twice daily. 10/17/19  Yes Aliene Altes S, PA-C  potassium chloride (KLOR-CON 10) 10 MEQ tablet Take 10 mEq by mouth daily.   Yes [provider]  predniSONE (DELTASONE) 5 MG tablet Take 5 mg by mouth daily. 07/04/19  Yes [provider]  rosuvastatin (CRESTOR) 40 MG tablet TAKE 1 TABLET BY MOUTH IN  THE EVENING Patient taking differently: Take 40 mg by mouth at bedtime.  03/11/19  Yes Corum, Rex Kras, MD  verapamil (CALAN) 120 MG tablet Take 120 mg by mouth daily. 10/12/19  Yes [provider]  escitalopram (LEXAPRO) 20 MG tablet TAKE 1 TABLET BY MOUTH  DAILY Patient not taking: Reported on 11/01/2019 03/11/19   Melissa Hancock, MD  glucose blood (ACCU-CHEK GUIDE) test strip Use as instructed Patient not taking: Reported on 11/01/2019 11/01/19   Cassandria Anger, MD  HYDROcodone-acetaminophen (NORCO/VICODIN) 5-325 MG tablet Take 1 tablet by mouth every 6 (six) hours as needed. Patient not taking: Reported on 11/01/2019 10/02/19   Milton Ferguson, MD  ondansetron (ZOFRAN ODT) 4 MG disintegrating tablet 4mg  ODT q4 hours prn nausea/vomit Patient not taking: Reported on 11/01/2019 10/02/19   Milton Ferguson, MD  ondansetron (ZOFRAN) 4 MG tablet Take 1 tablet (4 mg total) by mouth every 8 (eight) hours as needed for nausea or vomiting. Patient not taking: Reported on 11/01/2019 08/11/19   Erenest Rasher, PA-C  potassium chloride SA (KLOR-CON) 20  MEQ tablet Take 1 tablet (20 mEq total) by mouth 2 (two) times daily. Patient not taking: Reported on 11/01/2019 08/16/19   Melissa Hancock, MD  verapamil (CALAN SR) 120 MG CR tablet Take 1 tablet (120 mg total) by mouth daily. Patient not taking: Reported on 11/01/2019 09/08/19   Evans Lance, MD    Inpatient Medications: Scheduled Meds: . aspirin EC  81 mg Oral Daily  . carvedilol  25 mg Oral BID  . feeding supplement  1 Container Oral TID BM  . furosemide  20 mg Intravenous BID  . heparin  5,000 Units Subcutaneous Q8H  . latanoprost  1 drop Both Eyes QHS  . levothyroxine  88 mcg Oral QAC breakfast  . metoCLOPramide (REGLAN) injection  7.5 mg Intravenous  TID AC  . mometasone-formoterol  2 puff Inhalation BID  . pantoprazole (PROTONIX) IV  40 mg Intravenous Q12H  . predniSONE  5 mg Oral Daily  . verapamil  120 mg Oral Daily   Continuous Infusions: . dextrose 5 % and 0.9 % NaCl with KCl 20 mEq/L 75 mL/hr at 11/11/19 0015   PRN Meds: acetaminophen **OR** acetaminophen, ipratropium-albuterol, ondansetron **OR** ondansetron (ZOFRAN) IV, polyethylene glycol  Allergies:   No Known Allergies  Social History:   Social History   Socioeconomic History  . Marital status: Widowed    Spouse name: Not on file  . Number of children: 4  . Years of education: Not on file  . Highest education level: Not on file  Occupational History  . Occupation: disability    Employer: UNEMPLOYED  Tobacco Use  . Smoking status: Former Smoker    Packs/day: 0.20    Years: 4.00    Pack years: 0.80    Types: Cigarettes    Quit date: 05/13/1995    Years since quitting: 24.5  . Smokeless tobacco: Never Used  Vaping Use  . Vaping Use: Never used  Substance and Sexual Activity  . Alcohol use: Not Currently  . Drug use: No  . Sexual activity: Not Currently  Other Topics Concern  . Not on file  Social History Narrative   Widow , lives by herself   Lost a son, 3 living    No children in [place], [place] lives  in [place], he visits and helps w/ shopping    Lost husband   Social Determinants of Health   Financial Resource Strain:   . Difficulty of Paying Living Expenses:   Food Insecurity:   . Worried About Charity fundraiser in the Last Year:   . Arboriculturist in the Last Year:   Transportation Needs:   . Film/video editor (Medical):   Marland Kitchen Lack of Transportation (Non-Medical):   Physical Activity:   . Days of Exercise per Week:   . Minutes of Exercise per Session:   Stress:   . Feeling of Stress :   Social Connections:   . Frequency of Communication with Friends and Family:   . Frequency of Social Gatherings with Friends and Family:   . Attends Religious Services:   . Active Member of Clubs or Organizations:   . Attends Archivist Meetings:   Marland Kitchen Marital Status:   Intimate Partner Violence:   . Fear of Current or Ex-Partner:   . Emotionally Abused:   Marland Kitchen Physically Abused:   . Sexually Abused:      Family History:    Family History  Problem Relation Age of Onset  . Asthma Mother   . Stroke Mother   . Diabetes Mother   . Diabetes Other        M, B, S  . Hypertension Sister        M, S,B  . Pancreatic cancer Brother   . Colon cancer Neg Hx   . Prostate cancer Neg Hx   . Breast cancer Neg Hx   . Esophageal cancer Neg Hx   . Liver disease Neg Hx   . Rectal cancer Neg Hx   . Stomach cancer Neg Hx   . Inflammatory bowel disease Neg Hx       Review of Systems    General:  No chills, fever, night sweats. Positive for weight loss.  Cardiovascular:  No chest pain, dyspnea on exertion, edema, orthopnea, palpitations,  paroxysmal nocturnal dyspnea.  Dermatological: No rash, lesions/masses Respiratory: No cough, dyspnea Urologic: No hematuria, dysuria Abdominal:   No bright red blood per rectum, melena, or hematemesis. Positive for nausea and vomiting.  Neurologic:  No visual changes, wkns, changes in mental status. All other systems reviewed and are otherwise  negative except as noted above.  Physical Exam/Data    Vitals:   11/10/19 2004 11/10/19 2033 11/11/19 0505 11/11/19 0751  BP: 113/75  107/72   Pulse: 80  86   Resp: 20  18   Temp: 99.8 F (37.7 C)  100.3 F (37.9 C)   TempSrc: Oral  Oral   SpO2: 100% 99%  98%  Weight:      Height:        Intake/Output Summary (Last 24 hours) at 11/11/2019 0930 Last data filed at 11/11/2019 0510 Gross per 24 hour  Intake 5050.14 ml  Output 1200 ml  Net 3850.14 ml   Filed Weights   11/01/19 1448 11/01/19 2118  Weight: 68.9 kg 69.6 kg   Body mass index is 23.33 kg/m.   General: Pleasant frail appearing female, currently in NAD Psych: Normal affect. Neuro: Alert and oriented X 3. Moves all extremities spontaneously. HEENT: Normal  Neck: Supple without bruits or JVD. Lungs:  Resp regular and unlabored, CTA without wheezing or rales. Heart: RRR no s3, s4, or murmurs. Abdomen: Soft, non-tender, non-distended, BS + x 4.  Extremities: No clubbing, cyanosis or edema. DP/PT/Radials 2+ and equal bilaterally.   EKG:  The EKG was personally reviewed and demonstrates: NSR, HR 65 with 1st degree AV Block. No acute ST abnormalities when compared to prior tracings.   Telemetry:  Telemetry was personally reviewed and demonstrates: NSR, HR in 60's to 70's.    Labs/Studies     Relevant CV Studies:  Cardiac Catheterization: 10/2017  The left ventricular systolic function is normal.  LV end diastolic pressure is low.  The left ventricular ejection fraction is 55-65% by visual estimate.  There is no aortic valve stenosis.  Small pericardial effusion.  No CAD. Chest pain is not ischemic in nature.  Cardiac MRI: 12/2018 IMPRESSION: 1. Normal LV size, moderate concentric hypertrophy, and normal systolic function (EF 94%)  2. Normal RV size and systolic function (EF 49%)  3. Patchy late gadolinium enhancement at LV basal septum and inferior RV insertion site. This type of LGE pattern  is often seen in hypertrophic cardiomyopathy. Her hypertrophy appears more concentric, though there is mild asymmetry (77mm in basal septum vs 14 mm in lateral wall). Would consider HCM, particularly if degree of hypertrophy is not explained by other causes  4. Small pericardial effusion, primarily located adjacent to LV free wall measuring up to 17mm. Normal pericardial thickness. No evidence of ventricular interdependence on real time imaging to suggest constriction. No pericardial LGE.  Event Monitor: 01/2019  Normal sinus rhythm and sinus tachycardia. The average heart rate was 87bpm and the heart rate ranged from 66 to 135bpm.  Wide complex tachycardia for 5 beats, likely atrial tachcycardia with aberration.  Occasional PVCs and ventricular couplets.  Occasional PACs and atrial couplets.  Paroxysmal atrial tachycarda at 135bpm.   Echocardiogram: 09/2019 IMPRESSIONS    1. Moderate asymmetric basal septal hypertrophy of the basal septum up to  1.6 cm. Findings suggestive of sigmoid variant HCM. No SAM and no LVOT  obstruction present. Refer to recent CMR for better characterization. Left  ventricular ejection fraction, by  estimation, is 55 to 60%. The left ventricle has  normal function. The  left ventricle has no regional wall motion abnormalities. There is  moderate asymmetric left ventricular hypertrophy of the basal-septal  segment. Left ventricular diastolic parameters  are consistent with Grade I diastolic dysfunction (impaired relaxation).  2. Right ventricular systolic function is normal. The right ventricular  size is normal. There is mildly elevated pulmonary artery systolic  pressure. The estimated right ventricular systolic pressure is 02.7 mmHg.  3. The mitral valve is grossly normal. Trivial mitral valve  regurgitation. No evidence of mitral stenosis.  4. The aortic valve is tricuspid. Aortic valve regurgitation is not  visualized. Mild aortic valve  sclerosis is present, with no evidence of  aortic valve stenosis.  5. The inferior vena cava is normal in size with greater than 50%  respiratory variability, suggesting right atrial pressure of 3 mmHg.   Laboratory Data:  Chemistry Recent Labs  Lab 11/08/19 0444 11/09/19 0459 11/10/19 0453  NA 136 135 134*  K 4.0 4.0 3.4*  CL 102 102 102  CO2 27 24 23   GLUCOSE 127* 129* 145*  BUN 16 18 19   CREATININE 1.14* 1.19* 1.14*  CALCIUM 7.6* 7.8* 7.8*  GFRNONAA 49* 46* 49*  GFRAA 56* 54* 56*  ANIONGAP 7 9 9     Recent Labs  Lab 11/08/19 0444 11/09/19 0459 11/10/19 0453  PROT 5.3* 5.5* 5.5*  ALBUMIN 2.0* 2.0* 2.0*  AST 12* 13* 11*  ALT 9 10 8   ALKPHOS 57 66 73  BILITOT 0.6 0.7 0.7   Hematology Recent Labs  Lab 11/07/19 0437 11/09/19 0459 11/10/19 0453  WBC 4.2 6.1 6.7  RBC 3.24* 3.15* 3.24*  HGB 9.2* 9.3* 9.5*  HCT 28.3* 27.2* 27.5*  MCV 87.3 86.3 84.9  MCH 28.4 29.5 29.3  MCHC 32.5 34.2 34.5  RDW 16.5* 16.3* 15.6*  PLT 242 183 244   Cardiac EnzymesNo results for input(s): TROPONINI in the last 168 hours. No results for input(s): TROPIPOC in the last 168 hours.  BNP Recent Labs  Lab 11/10/19 0453  BNP 230.0*    DDimer No results for input(s): DDIMER in the last 168 hours.  Radiology/Studies:  CT ABDOMEN WO CONTRAST  Result Date: 11/08/2019 CLINICAL DATA:  Assess anatomy for possible G-tube placement EXAM: CT ABDOMEN WITHOUT CONTRAST TECHNIQUE: Multidetector CT imaging of the abdomen was performed following the standard protocol without IV contrast. COMPARISON:  09/19/2019 FINDINGS: Lower chest: Small bilateral pleural effusions. Coronary artery and aortic calcifications. Bronchiectasis in the lower lobes. Hepatobiliary: No focal hepatic abnormality. Gallbladder unremarkable. Pancreas: No focal abnormality or ductal dilatation. Spleen: No focal abnormality.  Normal size. 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 No hydronephrosis. No renal or adrenal mass. Stomach/Bowel: Small bowel loops are dilated and fluid-filled. Numerous small bowel loops as well as the colon overlie the stomach anteriorly and would make gastrostomy tube placement difficult. Diverticula seen within the colon. Vascular/Lymphatic: Aortic atherosclerosis. No enlarged abdominal lymph nodes. Other: No free fluid or free air. Musculoskeletal: No acute bony abnormality. IMPRESSION: Dilated, fluid-filled small bowel loops. Appearance is concerning for possible small bowel obstruction. Numerous small bowel loops and the colon lie anterior to the stomach and would make gastrostomy tube placement difficult. Small bilateral pleural effusions.  Bibasilar bronchiectasis. Colonic diverticulosis. Electronically Signed   By: Rolm Baptise M.D.   On: 11/08/2019 19:27   NM GASTRIC EMPTYING  Result Date: 11/08/2019 CLINICAL DATA:  Refractory nausea, vomiting and abdominal pain for weeks, weight loss EXAM: NUCLEAR  MEDICINE GASTRIC EMPTYING SCAN TECHNIQUE: After oral ingestion of radiolabeled meal, sequential abdominal images were obtained for 120 minutes. Residual percentage of activity remaining within the stomach was calculated at 60 and 120 minutes. Patient was planned to be a 4 hour exam but she terminated the procedure at 2 hours, refusing to complete additional imaging. After oral ingestion of radiolabeled meal, sequential abdominal images were obtained for 4 hours. Percentage of activity emptying the stomach was calculated at 1 hour, 2 hour, 3 hour, and 4 hours. RADIOPHARMACEUTICALS:  2 mCi Tc-83m sulfur colloid in standardized meal COMPARISON:  12/10/2018 FINDINGS: Expected location of the stomach in the left upper quadrant. Some retained contrast in distal esophagus versus hiatal hernia throughout exam. Ingested meal empties the stomach slowly over the course of the study. 3% emptying at 1 hour. 30% emptying at 2 hours. Findings represent delayed  gastric emptying at 2 hours. IMPRESSION: Delayed gastric emptying at 2 hours. Patient refused completion of the final 2 hours of the 4 hour exam. Electronically Signed   By: Lavonia Dana M.D.   On: 11/08/2019 14:14     Assessment & Plan    1. Preoperative Cardiac Clearance for Open Feeding Tube Placement and Cholecystectomy - From a cardiac perspective she has known hypertrophic CM and recent echo in 09/2019 showed moderate asymmetric basal septal hypertrophy of the basal septum up to 1.6 cm and suggestive of sigmoid variant HCM. She does report episodic chest pain which typically occurs with PO consumption and has not been associated with exertion. She did have normal coronary arteries by cath in 10/2017. - From a cardiac perspective, her RCRI Risk is 0.9% risk of a major cardiac event and further pre-operative testing is not indicated at this time. I feel she is at a higher risk from a general medical perspective given her frail status, significant weight loss (greater than 150 lbs) and chronic steroid use. Dr. Harl Bowie to see later today as well.   2. Hypertropic CM - Echo in 09/2019 showed moderate asymmetric basal septal hypertrophy of the basal septum up to 1.6 cm and suggestive of sigmoid variant HCM.  - She has been continued on Coreg 25mg  BID and Verapamil 120mg  daily. Continue with adequate hydration.   3. HTN - BP has been well-controlled at 99/65 - 113/75 within the past 24 hours. Continue current medication regimen.   4. Scleroderma - She has been on chronic steroids with Prednisone 5mg  daily.   5. COPD - She denies any recent orthopnea, PND or progressive dyspnea. On 2L [place].    For questions or updates, please contact [place] Please consult [email or web address] for contact info under Cardiology/STEMI.  Signed, Erma Heritage, PA-C 11/11/2019, 9:30 AM Pager: [phone]   Attending note Patient seen and discussed with PA Ahmed Prima, I agree with her documentation. 69 yo  female history of hypertrophic CM, prior pericarditis, scleroderma, HTN, OSA, COPD, cirrnosis we are consulted for preop evaluation for cholecystectomy and feeding tube placement.    To clarify her hypertrophic CM history, from echo basal septum 1.6 cm without SAM or gradient. MRI LGE suggesting HCM with basal septum 34mm and and lateral wall 14 mm without gradient. There is no evidence of a dynamic gradient from any studies for her.    09/2019 echo: LVEF 55-60%, basal septum 1.6 cm without SAM and no gradient. 12/2018 cardiac MRI: Patchy late gadolinium enhancement at LV basal septum and inferior RV insertion site. This type of LGE pattern is often seen in hypertrophic cardiomyopathy.  Her hypertrophy appears more concentric, though there is mild asymmetry (59mm in basal septum vs 14 mm in lateral wall). Would consider HCM, particularly if degree of hypertrophy is not explained by other causes 10/2017 cath: no CAD    From a purely cardiac standpoint her testing over the years has been fairly benign. Has some evidence of hypertrophic CM but no evidence of obstructive physiology. Her LVEF is normal, normal cath in 2019. From a purely cardiac standpoint there is no contraindication to her surgery, defer considerations regarding her noncardiac comorbidities to primary team. No additional cardaic testing planned at this time.   Carlyle Dolly MD

## 2019-11-11 NOTE — Progress Notes (Signed)
Subjective: Complains of lots of spitting up mucus. Vomited this morning. No abdominal pain today. No other GI complaints.  Objective: Vital signs in last 24 hours: Temp:  [99.1 F (37.3 C)-100.3 F (37.9 C)] 100.3 F (37.9 C) (07/02 0505) Pulse Rate:  [80-86] 86 (07/02 0505) Resp:  [18-20] 18 (07/02 0505) BP: (99-113)/(65-75) 107/72 (07/02 0505) SpO2:  [98 %-100 %] 98 % (07/02 0751) Last BM Date: 11/10/19 General:   Alert and oriented, pleasant. Sleepy. Head:  Normocephalic and atraumatic. Eyes:  No icterus, sclera clear. Conjuctiva pink.  Heart:  S1, S2 present, no murmurs noted.  Lungs: Clear to auscultation bilaterally, without wheezing, rales, or rhonchi.  Abdomen:  Bowel sounds present, soft, non-distended. Minimal to mild TTP abdomen. No HSM or hernias noted. No rebound or guarding. No masses appreciated  Msk:  Symmetrical without gross deformities. Normal posture. Pulses:  Normal bilateral DP pulses noted. Extremities:  Without clubbing or edema. Neurologic:  Alert and  oriented x4;  grossly normal neurologically. Psych:  Alert and cooperative. Normal mood and affect.  Intake/Output from previous day: 07/01 0701 - 07/02 0700 In: 5290.1 [P.O.:480; I.V.:4810.1] Out: 1200 [Urine:1200] Intake/Output this shift: No intake/output data recorded.  Lab Results: Recent Labs    11/09/19 0459 11/10/19 0453  WBC 6.1 6.7  HGB 9.3* 9.5*  HCT 27.2* 27.5*  PLT 183 244   BMET Recent Labs    11/09/19 0459 11/10/19 0453  NA 135 134*  K 4.0 3.4*  CL 102 102  CO2 24 23  GLUCOSE 129* 145*  BUN 18 19  CREATININE 1.19* 1.14*  CALCIUM 7.8* 7.8*   LFT Recent Labs    11/09/19 0459 11/10/19 0453  PROT 5.5* 5.5*  ALBUMIN 2.0* 2.0*  AST 13* 11*  ALT 10 8  ALKPHOS 66 73  BILITOT 0.7 0.7  BILIDIR  --  0.2  IBILI  --  0.5   PT/INR No results for input(s): LABPROT, INR in the last 72 hours. Hepatitis Panel No results for input(s): HEPBSAG, HCVAB, HEPAIGM, HEPBIGM  in the last 72 hours.   Studies/Results: No results found.  Assessment: 69 year old female admitted with hypoglycemia with history of chronic nausea/vomiting, abdominal pain, profound weight loss of greater than 150 pounds in the past 2 years (weight stable last 6 months), dysphagia to solids/pills, recent diagnosis of scleroderma.  Nausea/Vomiting: GES only completed to 2 hours. Consistent with gastroparesis. Reglan increased from 5 mg to 7.5 mg TID before meals. Vomiting with chicken broth this morning again (doesn't like broth). CT abdomen without contrast yesterday for assessment of anatomy prior to feeding tube placement with dilated small bowel loops, fluid filled. Clinically, does not seem to be consistent with bowel obstruction but very well could have developing ileus. Denies abdominal pain (though mildly tender on palpation). Some fullness, +flatus. Need to follow clinically. Xray if worsening symptoms. Feeding tube unable to be safely placed, so general surgery has been consulted.Planned J-tube or GJ tube next week after cardiology input/clearance.  Pancreatic lesion:MRCP this admission markedly motion degraded. Will need CT abd with pancreatic protocol in May 2022 as she did not have a good quality study with MRI in setting of motion degradation. Has been placed on recall list appropriately  Documentation in chart with concern for cirrhosis: Known fatty liver. Spleen normal. No stigmata of portal hypertension on recent EGD. No thrombocytopenia. Likely dealing moreso with hepatic steatosis and does not appear to have advanced liver disease.   Plan: 1. Continued clears for now  1. Continue Reglan 2. Continued supportive care in anticipation of JTube/GJ next week   Thank you for allowing [place] to participate in the care of Melissa Hatchet, DNP, AGNP-C Adult & Gerontological Nurse Practitioner [facility]   LOS: 9 days    11/11/2019, 8:37  AM

## 2019-11-11 NOTE — Plan of Care (Signed)
  Problem: Education: Goal: Knowledge of General Education information will improve Description: Including pain rating scale, medication(s)/side effects and non-pharmacologic comfort measures Outcome: Progressing   Problem: Clinical Measurements: Goal: Diagnostic test results will improve Outcome: Progressing   

## 2019-11-12 ENCOUNTER — Inpatient Hospital Stay (HOSPITAL_COMMUNITY): Payer: Medicare Other

## 2019-11-12 ENCOUNTER — Encounter (HOSPITAL_COMMUNITY): Payer: Self-pay | Admitting: Internal Medicine

## 2019-11-12 LAB — BASIC METABOLIC PANEL
Anion gap: 8 (ref 5–15)
BUN: 16 mg/dL (ref 8–23)
CO2: 24 mmol/L (ref 22–32)
Calcium: 7.5 mg/dL — ABNORMAL LOW (ref 8.9–10.3)
Chloride: 105 mmol/L (ref 98–111)
Creatinine, Ser: 0.99 mg/dL (ref 0.44–1.00)
GFR calc Af Amer: >60 mL/min (ref >60)
GFR calc non Af Amer: 58 mL/min — ABNORMAL LOW (ref >60)
Glucose, Bld: 122 mg/dL — ABNORMAL HIGH (ref 70–99)
Potassium: 3 mmol/L — ABNORMAL LOW (ref 3.5–5.1)
Sodium: 137 mmol/L (ref 135–145)

## 2019-11-12 LAB — VITAMIN B12: Vitamin B-12: 255 pg/mL (ref 180–914)

## 2019-11-12 LAB — HEPATIC FUNCTION PANEL
ALT: 8 U/L (ref 0–44)
AST: 10 U/L — ABNORMAL LOW (ref 15–41)
Albumin: 1.7 g/dL — ABNORMAL LOW (ref 3.5–5.0)
Alkaline Phosphatase: 78 U/L (ref 38–126)
Bilirubin, Direct: 0.3 mg/dL — ABNORMAL HIGH (ref 0.0–0.2)
Indirect Bilirubin: 0.5 mg/dL (ref 0.3–0.9)
Total Bilirubin: 0.8 mg/dL (ref 0.3–1.2)
Total Protein: 5.4 g/dL — ABNORMAL LOW (ref 6.5–8.1)

## 2019-11-12 LAB — GLUCOSE, CAPILLARY
Glucose-Capillary: 115 mg/dL — ABNORMAL HIGH (ref 70–99)
Glucose-Capillary: 123 mg/dL — ABNORMAL HIGH (ref 70–99)
Glucose-Capillary: 124 mg/dL — ABNORMAL HIGH (ref 70–99)
Glucose-Capillary: 126 mg/dL — ABNORMAL HIGH (ref 70–99)
Glucose-Capillary: 128 mg/dL — ABNORMAL HIGH (ref 70–99)
Glucose-Capillary: 132 mg/dL — ABNORMAL HIGH (ref 70–99)
Glucose-Capillary: 97 mg/dL (ref 70–99)

## 2019-11-12 LAB — URINE CULTURE

## 2019-11-12 LAB — PROCALCITONIN: Procalcitonin: 0.15 ng/mL

## 2019-11-12 LAB — IRON AND TIBC: Iron: 49 ug/dL (ref 28–170)

## 2019-11-12 LAB — CBC
HCT: 24.9 % — ABNORMAL LOW (ref 36.0–46.0)
Hemoglobin: 8.3 g/dL — ABNORMAL LOW (ref 12.0–15.0)
MCH: 28.6 pg (ref 26.0–34.0)
MCHC: 33.3 g/dL (ref 30.0–36.0)
MCV: 85.9 fL (ref 80.0–100.0)
Platelets: 259 10*3/uL (ref 150–400)
RBC: 2.9 MIL/uL — ABNORMAL LOW (ref 3.87–5.11)
RDW: 15.7 % — ABNORMAL HIGH (ref 11.5–15.5)
WBC: 6.7 10*3/uL (ref 4.0–10.5)
nRBC: 0 % (ref 0.0–0.2)

## 2019-11-12 LAB — LACTIC ACID, PLASMA: Lactic Acid, Venous: 1.2 mmol/L (ref 0.5–1.9)

## 2019-11-12 LAB — FOLATE: Folate: 22.6 ng/mL (ref >5.9)

## 2019-11-12 LAB — FERRITIN: Ferritin: 219 ng/mL (ref 11–307)

## 2019-11-12 MED ORDER — IOHEXOL 9 MG/ML PO SOLN
ORAL | Status: AC
  Filled 2019-11-12: qty 500

## 2019-11-12 MED ORDER — IOHEXOL 300 MG/ML  SOLN
100.0000 mL | Freq: Once | INTRAMUSCULAR | Status: AC | PRN
  Administered 2019-11-12: 100 mL via INTRAVENOUS

## 2019-11-12 MED ORDER — POTASSIUM CHLORIDE 10 MEQ/100ML IV SOLN
10.0000 meq | INTRAVENOUS | Status: AC
  Administered 2019-11-12 (×4): 10 meq via INTRAVENOUS
  Filled 2019-11-12 (×4): qty 100

## 2019-11-12 NOTE — Progress Notes (Signed)
Subjective:  Patient says she has not vomited today.  She says she just second diet of liquids.  She denies heartburn or abdominal pain.  She also denies melena or rectal bleeding.  Current Medications:  Current Facility-Administered Medications:  .  acetaminophen (TYLENOL) tablet 650 mg, 650 mg, Oral, Q6H PRN, 650 mg at 11/09/19 0539 **OR** acetaminophen (TYLENOL) suppository 650 mg, 650 mg, Rectal, Q6H PRN, Emokpae, Ejiroghene E, MD .  aspirin EC tablet 81 mg, 81 mg, Oral, Daily, Emokpae, Ejiroghene E, MD, 81 mg at 11/12/19 0950 .  carvedilol (COREG) tablet 25 mg, 25 mg, Oral, BID, Emokpae, Ejiroghene E, MD, 25 mg at 11/12/19 0949 .  dextrose 5 % and 0.9 % NaCl with KCl 20 mEq/L infusion, , Intravenous, Continuous, Memon, Jehanzeb, MD, Last Rate: 75 mL/hr at 11/11/19 1640, Rate Verify at 11/11/19 1640 .  feeding supplement (BOOST / RESOURCE BREEZE) liquid 1 Container, 1 Container, Oral, TID BM, Orson Eva, MD, 1 Container at 11/11/19 1308 .  furosemide (LASIX) injection 20 mg, 20 mg, Intravenous, BID, Memon, Jehanzeb, MD, 20 mg at 11/12/19 0838 .  heparin injection 5,000 Units, 5,000 Units, Subcutaneous, Q8H, Emokpae, Ejiroghene E, MD, 5,000 Units at 11/12/19 0607 .  ipratropium-albuterol (DUONEB) 0.5-2.5 (3) MG/3ML nebulizer solution 3 mL, 3 mL, Nebulization, Q4H PRN, Emokpae, Ejiroghene E, MD .  latanoprost (XALATAN) 0.005 % ophthalmic solution 1 drop, 1 drop, Both Eyes, QHS, Emokpae, Ejiroghene E, MD, 1 drop at 11/11/19 2229 .  levothyroxine (SYNTHROID) tablet 88 mcg, 88 mcg, Oral, QAC breakfast, Emokpae, Ejiroghene E, MD, 88 mcg at 11/12/19 0607 .  metoCLOPramide (REGLAN) injection 7.5 mg, 7.5 mg, Intravenous, TID AC, Taneia Mealor U, MD, 7.5 mg at 11/12/19 0838 .  mometasone-formoterol (DULERA) 200-5 MCG/ACT inhaler 2 puff, 2 puff, Inhalation, BID, Emokpae, Ejiroghene E, MD, 2 puff at 11/12/19 0736 .  ondansetron (ZOFRAN) tablet 4 mg, 4 mg, Oral, Q6H PRN **OR** ondansetron (ZOFRAN)  injection 4 mg, 4 mg, Intravenous, Q6H PRN, Emokpae, Ejiroghene E, MD, 4 mg at 11/11/19 0220 .  pantoprazole (PROTONIX) injection 40 mg, 40 mg, Intravenous, Q12H, Katye Valek U, MD, 40 mg at 11/12/19 0949 .  polyethylene glycol (MIRALAX / GLYCOLAX) packet 17 g, 17 g, Oral, Daily PRN, Emokpae, Ejiroghene E, MD .  predniSONE (DELTASONE) tablet 5 mg, 5 mg, Oral, Daily, Emokpae, Ejiroghene E, MD, 5 mg at 11/12/19 0949   Objective: Blood pressure 113/69, pulse 81, temperature 99.4 F (37.4 C), temperature source Oral, resp. rate 16, height _0  (1.727 m), weight 69.6 kg, SpO2 100 %. Patient is alert. She appears chronically ill.   She has generalized muscle wasting. Abdomen is soft and nontender with organomegaly or masses. No peripheral edema or clubbing noted.    Labs/studies Results:  CBC Latest Ref Rng & Units 11/12/2019 11/10/2019 11/09/2019  WBC 4.0 - 10.5 K/uL 6.7 6.7 6.1  Hemoglobin 12.0 - 15.0 g/dL 8.3(L) 9.5(L) 9.3(L)  Hematocrit 36 - 46 % 24.9(L) 27.5(L) 27.2(L)  Platelets 150 - 400 K/uL 259 244 183    CMP Latest Ref Rng & Units 11/12/2019 11/10/2019 11/09/2019  Glucose 70 - 99 mg/dL 122(H) 145(H) 129(H)  BUN 8 - 23 mg/dL _1 Creatinine 0.44 - 1.00 mg/dL 0.99 1.14(H) 1.19(H)  Sodium 135 - 145 mmol/L 137 134(L) 135  Potassium 3.5 - 5.1 mmol/L 3.0(L) 3.4(L) 4.0  Chloride 98 - 111 mmol/L 105 102 102  CO2 22 - 32 mmol/L _2 Calcium 8.9 - 10.3 mg/dL 7.5(L) 7.8(L)  7.8(L)  Total Protein 6.5 - 8.1 g/dL 5.4(L) 5.5(L) 5.5(L)  Total Bilirubin 0.3 - 1.2 mg/dL 0.8 0.7 0.7  Alkaline Phos 38 - 126 U/L 78 73 66  AST 15 - 41 U/L 10(L) 11(L) 13(L)  ALT 0 - 44 U/L _0 Hepatic Function Latest Ref Rng & Units 11/12/2019 11/10/2019 11/09/2019  Total Protein 6.5 - 8.1 g/dL 5.4(L) 5.5(L) 5.5(L)  Albumin 3.5 - 5.0 g/dL 1.7(L) 2.0(L) 2.0(L)  AST 15 - 41 U/L 10(L) 11(L) 13(L)  ALT 0 - 44 U/L _1 Alk Phosphatase 38 - 126 U/L 78 73 66  Total Bilirubin 0.3 - 1.2 mg/dL 0.8 0.7 0.7   Bilirubin, Direct 0.0 - 0.2 mg/dL 0.3(H) 0.2 -    Lab Results  Component Value Date   CRP 33 (H) 03/04/2018      Assessment:    Chronic nausea and vomiting secondary to  gastroparesis.  Patient remains on metoclopramide at a dose of 7.5 mg IV before meal.  She was not able to complete solid-phase gastric emptying study but she only emptied 3% in first hour which makes me believe that she has severe gastroparesis.  Etiology felt to be diabetes mellitus.  She seems to be tolerating clear liquids. IR felt patient not a good candidate for percutaneous PEG/PEJ placement. Patient has been evaluated by Dr. Constance Haw of surgical service.  She will undergo endoscopic and/or laparoscopic placement of PEG/PEJ next week.  We are malnutrition secondary to chronic nausea and vomiting and inability to eat.  Serum albumin 1.7. She also has severe reflux esophagitis secondary to gastroparesis. Anemia of chronic disease. Pancreatic lesion which could not be adequately evaluated with MRI because of motion degradation. She had unenhanced CT on 11/08/2019 which did not reveal any obvious abnormality to pancreas.  Obviously this is a limited study without contrast. TSH level was 10.589 11 days ago.  Will recheck level again. Repeat for hypokalemia per Dr. Shanon Brow Tat.   Recommendations  Diet changed to full liquids. Patient encouraged to sit upright in recliner at mealtime. TSH level in a.m.

## 2019-11-12 NOTE — Progress Notes (Signed)
PROGRESS NOTE  [name] DHR:[id number] DOB: 20-Oct-1950 DOA: 11/01/2019 PCP: Maryruth Hancock, MD Brief History: 69 y.o.femalewith medical history significant forCOPD asthma with chronic respiratory failure on 3 L, paroxysmal atrial fibrillation,HOCM,liver cirrhosis, diabetes mellitus. Patient was sent to the ED via EMS from her endocrinologist'soffice with blood sugar of 32,she was given D50 and blood sugar increased to 47, but on arrival to the ED, her blood sugar was 20.Patient has hadprofound weight loss of greater than 150 pounds in the past 2 years (weight stable last 6 months), dysphagia to solids/pills, recent diagnosis of scleroderma.  Over the past 3 months she has been vomiting every day, at least 2-3 times a day, she has been vomiting almostevery thing shetries to eat,withresulted in weight loss. She reports chronic intermittent abdominal pain. But she has no abdominal pain today. No loose stools. Patient is not on any medications for her diabetes.  She has chronic lower extremity swelling that that fluctuates but is currently unchanged,no chest pain, no difficulty breathing.  Assessment/Plan: Hypoglycemia/FTT/Intractable Nausea/vomiting -Dr. Roderic Palau discussed with endocrineDr. Dorris Fetch. Felt that hypoglycemia is related to severe malnutrition and depleted glycogen stores. Recommendations were to keep the patient on dextrose infusion while in the hospital. Also, keep gloves on patient's hands to help keep them warm. This may improve accuracy of CBG. He recommended holding off on further work-up for insulinoma in current setting since results may be skewed. This will be further followed up as an outpatient. Overall blood sugars are better with dextrose infusion.Need to ensure that patient is getting adequate enteral nutrition prior to discontinuing dextrose infusion. -continue D5/0.9NS  Intractable nausea/vomiting -worsen last 3  months -related to severe reflux esophagitis and gastroparesis -05/19/19--EGD--severe reflux esophagitis -GES--confirms gastroparesis -reglan increased to 7.5 mg tid -GI following and assisting with further work-up -Dr. Laural Golden agrees with PEG/PEJ -IR consulted-->anatomy not conducive for PEC -pt continues to vomiting and having difficulty tolerating clear liquids; many times she is "spitting up"  -consult general surgery--planning open G-tube 7/7 -7/3--case discussed with Dr. Fredda Hammed CT abd/pelv  Hypertrophic CM -appreciate cardiology consult-->From a purely cardiac standpoint there is no contraindication to her surgery, defer considerations regarding her noncardiac comorbidities to primary team. No additional cardaic testing planned at this time.  Diabetes Mellitus type 2 -11/01/19 A1c--5.0 -no on any agents as outpt -hypoglycemic epidoses improved on IV dextrose infusion  Dilated SB loops -likely ileus, not clinically obstructed -6/29 CT abd/pelv--dilated fluid filled SB loops, many lie anterior to stomach.  Bibasilar bronchiectasis -passing flatus; had small BM this afternoon -discussed with Dr. Fredda Hammed CT abd/pelv  Chronic respiratory failure with hypoxia -continue duonebs -normally on 3L at home  Pancreatic lesion: -MRCP this admission markedly motion degraded. Will need CT abd with pancreatic protocol in May 2022 as she did not have a good quality study with MRI in setting of motion degradation.  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 Unable to tolerate po. Still vomiting. Surgical Open gtube planned   Dispo: The patient is from:Home Anticipated d/c is IW:OEHO Anticipated d/c date is: 3 days Patient currently is not medically stable to d/c.  Family Communication:updated son on phone 7/3  Consultants:GI, general surgery, cardiology  Code Status: DNR  DVT Prophylaxis: [place] Heparin    Procedures: As Listed in Progress Note Above  Antibiotics: None      Subjective: Pt continues to have spitting up episodes.  Denies f/c, cp, sob, dysuria, hematuria  Objective: Vitals:   11/11/19 1932 11/11/19 2100 11/12/19 0443 11/12/19 0736  BP:  110/71 113/69   Pulse:  72 81   Resp:  16 16   Temp:  99.3 F (37.4 C) 99.4 F (37.4 C)   TempSrc:  Oral Oral   SpO2: 98% 100%  100%  Weight:      Height:        Intake/Output Summary (Last 24 hours) at 11/12/2019 1434 Last data filed at 11/11/2019 2200 Gross per 24 hour  Intake 977.91 ml  Output 900 ml  Net 77.91 ml   Weight change:  Exam:   General:  Pt is alert, follows commands appropriately, not in acute distress  HEENT: No icterus, No thrush, No neck mass, [place]/AT  Cardiovascular: RRR, S1/S2, no rubs, no gallops  Respiratory: bibasilar crackles. No wheeze  Abdomen: Soft/+BS, non tender, non distended, no guarding  Extremities: No edema, No lymphangitis, No petechiae, No rashes, no synovitis   Data Reviewed: I have personally reviewed following labs and imaging studies Basic Metabolic Panel: Recent Labs  Lab 11/07/19 0437 11/08/19 0444 11/09/19 0459 11/10/19 0453 11/12/19 0513  NA 136 136 135 134* 137  K 4.2 4.0 4.0 3.4* 3.0*  CL 100 102 102 102 105  CO2 28 27 24 23 24   GLUCOSE 129* 127* 129* 145* 122*  BUN 16 16 18 19 16   CREATININE 1.09* 1.14* 1.19* 1.14* 0.99  CALCIUM 7.9* 7.6* 7.8* 7.8* 7.5*   Liver Function Tests: Recent Labs  Lab 11/07/19 0437  11/08/19 0444 11/09/19 0459 11/10/19 0453 11/12/19 0513  AST 11* 12* 13* 11* 10*  ALT 9 9 10 8 8   ALKPHOS 54 57 66 73 78  BILITOT 0.7 0.6 0.7 0.7 0.8  PROT 5.2* 5.3* 5.5* 5.5* 5.4*  ALBUMIN 2.2* 2.0* 2.0* 2.0* 1.7*   No results for input(s): LIPASE, AMYLASE in the last 168 hours. No results for input(s): AMMONIA in the last 168 hours. Coagulation Profile: No results for input(s): INR, PROTIME in the last 168 hours. CBC: Recent Labs  Lab 11/07/19 0437 11/09/19 0459 11/10/19 0453 11/12/19 0513  WBC 4.2 6.1 6.7 6.7  HGB 9.2* 9.3* 9.5* 8.3*  HCT 28.3* 27.2* 27.5* 24.9*  MCV 87.3 86.3 84.9 85.9  PLT 242 183 244 259   Cardiac Enzymes: No results for input(s): CKTOTAL, CKMB, CKMBINDEX, TROPONINI in the last 168 hours. BNP: Invalid input(s): POCBNP CBG: Recent Labs  Lab 11/11/19 2020 11/12/19 0016 11/12/19 0420 11/12/19 0757 11/12/19 1140  GLUCAP 144* 124* 115* 97 126*   HbA1C: No results for input(s): HGBA1C in the last 72 hours. Urine analysis:    Component Value Date/Time   COLORURINE YELLOW 08/17/2019 [place] 08/17/2019 1134   LABSPEC 1.006 08/17/2019 1134   PHURINE 7.0 08/17/2019 1134   GLUCOSEU NEGATIVE 08/17/2019 1134   GLUCOSEU NEGATIVE 11/30/2013 1057   HGBUR NEGATIVE 08/17/2019 1134   BILIRUBINUR NEGATIVE 12/09/2018 0923   [place] 08/17/2019 1134   PROTEINUR NEGATIVE 08/17/2019 1134   UROBILINOGEN 0.2 11/30/2013 1057   NITRITE NEGATIVE 08/17/2019 1134   LEUKOCYTESUR NEGATIVE 08/17/2019 1134   Sepsis Labs: @LABRCNTIP (procalcitonin:4,lacticidven:4) ) Recent Results (from the past 240 hour(s))  Culture, Urine     Status: Abnormal   Collection Time: 11/10/19  8:06 PM   Specimen: Urine, Random  Result Value Ref Range Status   Specimen Description   Final    URINE, RANDOM Performed at [facility], [address] [place], [place] [address]    Special Requests   Final    NONE Performed at [facility], [address] [place], [place] [address]    Culture MULTIPLE SPECIES PRESENT, SUGGEST RECOLLECTION (A)  Final   Report Status 11/12/2019 FINAL  Final     Scheduled Meds: . aspirin EC  81 mg Oral Daily  . carvedilol  25 mg Oral BID  . feeding supplement  1 Container Oral TID BM  . furosemide  20 mg Intravenous BID  . heparin  5,000 Units Subcutaneous Q8H  . latanoprost  1 drop Both Eyes QHS  . levothyroxine  88 mcg Oral QAC breakfast  . metoCLOPramide (REGLAN) injection  7.5 mg Intravenous TID AC  . mometasone-formoterol  2 puff Inhalation BID  . pantoprazole (PROTONIX) IV  40 mg Intravenous Q12H  . predniSONE  5 mg Oral Daily   Continuous Infusions: . dextrose 5 % and 0.9 % NaCl with KCl 20 mEq/L 75 mL/hr at 11/11/19 1640  . potassium chloride      Procedures/Studies: CT ABDOMEN WO CONTRAST  Result Date: 11/08/2019 CLINICAL DATA:  Assess anatomy for possible G-tube placement EXAM: CT ABDOMEN WITHOUT CONTRAST TECHNIQUE: Multidetector CT imaging of the abdomen was performed following the standard protocol without IV contrast. COMPARISON:  09/19/2019 FINDINGS: Lower chest: Small bilateral pleural effusions. Coronary artery and aortic calcifications. Bronchiectasis in the lower lobes. Hepatobiliary: No focal hepatic abnormality. Gallbladder unremarkable. Pancreas: No focal abnormality or ductal dilatation. Spleen: No focal abnormality.  Normal size. 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 No hydronephrosis. No renal or adrenal mass. Stomach/Bowel: Small bowel loops are dilated and fluid-filled. Numerous small bowel loops as well as the colon overlie the stomach anteriorly and would make gastrostomy tube placement difficult. Diverticula seen within the colon. Vascular/Lymphatic: Aortic atherosclerosis. No enlarged abdominal lymph nodes. Other: No free fluid or free air. Musculoskeletal: No acute bony abnormality. IMPRESSION: Dilated, fluid-filled  small bowel loops. Appearance is concerning for possible small bowel obstruction. Numerous small bowel loops and the colon lie anterior to the stomach and would make gastrostomy tube placement difficult. Small bilateral pleural effusions.  Bibasilar bronchiectasis. Colonic diverticulosis. Electronically Signed   By: Rolm Baptise M.D.   On: 11/08/2019 19:27   NM GASTRIC EMPTYING  Result Date: 11/08/2019 CLINICAL DATA:  Refractory nausea, vomiting and abdominal pain for weeks, weight loss EXAM: NUCLEAR MEDICINE GASTRIC EMPTYING SCAN TECHNIQUE: After oral ingestion of radiolabeled meal, sequential abdominal images were obtained for 120 minutes. Residual percentage of activity remaining within the stomach was calculated at 60 and 120 minutes. Patient was planned to be a 4 hour exam but she terminated the procedure at 2 hours, refusing to complete additional imaging. After oral ingestion of radiolabeled meal, sequential abdominal images were obtained for 4 hours. Percentage of activity emptying the stomach was calculated at 1 hour, 2 hour, 3 hour, and 4 hours. RADIOPHARMACEUTICALS:  2 mCi Tc-24m sulfur colloid in standardized meal COMPARISON:  12/10/2018 FINDINGS: Expected location of the stomach in the left upper quadrant. Some retained contrast in distal esophagus versus hiatal hernia throughout exam. Ingested meal empties the stomach slowly over the course of the study. 3% emptying at  1 hour. 30% emptying at 2 hours. Findings represent delayed gastric emptying at 2 hours. IMPRESSION: Delayed gastric emptying at 2 hours. Patient refused completion of the final 2 hours of the 4 hour exam. Electronically Signed   By: Lavonia Dana M.D.   On: 11/08/2019 14:14   MR 3D Recon At Scanner  Result Date: 11/03/2019 CLINICAL DATA:  Cystic lesions in the pancreas seen on CT scan. EXAM: MRI ABDOMEN WITHOUT AND WITH CONTRAST (INCLUDING MRCP) TECHNIQUE: Multiplanar multisequence MR imaging of the abdomen was performed both  before and after the administration of intravenous contrast. Heavily T2-weighted images of the biliary and pancreatic ducts were obtained, and three-dimensional MRCP images were rendered by post processing. CONTRAST:  70mL GADAVIST GADOBUTROL 1 MMOL/ML IV SOLN COMPARISON:  Abdominal CT 09/19/2019 FINDINGS: Markedly motion degraded study. Lower chest: Probable small right pleural effusion. Hepatobiliary: No gross abnormality within the liver parenchyma. Assessment of liver parenchyma on postcontrast imaging is motion degraded. 6 mm probable gallstone noted in the dependent gallbladder lumen. Common bile duct measures 5 mm diameter in the head of pancreas. Pancreas: 7 mm T2 hyperintense focus identified in the head of pancreas, similar to recent CT. This lesion is obscured by motion artifact on postcontrast imaging. The remaining tiny lesions seen in the pancreas on previous CT are also obscured. No dilatation of the main duct. Spleen:  No gross abnormality. Adrenals/Urinary Tract: No gross enhancing adrenal or renal mass lesion. No evidence for hydronephrosis. 13 mm probable cyst upper pole right kidney with scattered tiny T2 hyperintensities in both kidneys too small to characterize but likely benign. Stomach/Bowel: Nondilated. Diffuse distension of fluid-filled bowel seen throughout the abdomen. Small bowel loops measure up to 3.9 cm diameter. Vascular/Lymphatic: No abdominal aortic aneurysm. No discernible lymphadenopathy in the abdomen. Other:  No substantial intraperitoneal free fluid. Musculoskeletal: No suspicious marrow enhancement within the visualized bony anatomy. IMPRESSION: 1. Markedly motion degraded study. Exam is nondiagnostic for small lesions in the solid abdominal organs. 2. 7 mm T2 hyperintense focus in the head of pancreas, similar to recent CT. This lesion is obscured on postcontrast imaging. The remaining tiny lesions seen in the pancreas on previous CT are also obscured by motion artifact.  Given patient inability to reproducibly breath hold and remain still, using CT with its better temporal resolution for follow-up is recommended. For cystic lesions of this size in a patient of this age, consensus guidelines recommend repeat imaging every 2 years. This recommendation follows ACR consensus guidelines: Management of Incidental Pancreatic Cysts: A White Paper of the ACR Incidental Findings Committee. [place] 6568;12:751-700. 3. Cholelithiasis. No biliary dilatation. 4. Diffuse distension of fluid-filled bowel loops in the abdomen. Small bowel obstruction cannot be excluded. Electronically Signed   By: Misty Stanley M.D.   On: 11/03/2019 09:23   DG CHEST PORT 1 VIEW  Result Date: 11/12/2019 CLINICAL DATA:  Hypoxia. EXAM: PORTABLE CHEST 1 VIEW COMPARISON:  05/26/2019 FINDINGS: Lungs are hypoinflated without lobar consolidation or effusion. Mild diffuse interstitial prominence is present and slightly more pronounced although this change may related to the degree of hypoinflation. Cardiomediastinal silhouette and remainder of the exam is unchanged. IMPRESSION: Hypoinflation with mild diffuse interstitial prominence likely chronic. Acute bronchitic process is possible. Electronically Signed   By: Marin Olp M.D.   On: 11/12/2019 08:37   MR ABDOMEN MRCP W WO CONTAST  Result Date: 11/03/2019 CLINICAL DATA:  Cystic lesions in the pancreas seen on CT scan. EXAM: MRI ABDOMEN WITHOUT AND WITH  CONTRAST (INCLUDING MRCP) TECHNIQUE: Multiplanar multisequence MR imaging of the abdomen was performed both before and after the administration of intravenous contrast. Heavily T2-weighted images of the biliary and pancreatic ducts were obtained, and three-dimensional MRCP images were rendered by post processing. CONTRAST:  45mL GADAVIST GADOBUTROL 1 MMOL/ML IV SOLN COMPARISON:  Abdominal CT 09/19/2019 FINDINGS: Markedly motion degraded study. Lower chest: Probable small right pleural effusion.  Hepatobiliary: No gross abnormality within the liver parenchyma. Assessment of liver parenchyma on postcontrast imaging is motion degraded. 6 mm probable gallstone noted in the dependent gallbladder lumen. Common bile duct measures 5 mm diameter in the head of pancreas. Pancreas: 7 mm T2 hyperintense focus identified in the head of pancreas, similar to recent CT. This lesion is obscured by motion artifact on postcontrast imaging. The remaining tiny lesions seen in the pancreas on previous CT are also obscured. No dilatation of the main duct. Spleen:  No gross abnormality. Adrenals/Urinary Tract: No gross enhancing adrenal or renal mass lesion. No evidence for hydronephrosis. 13 mm probable cyst upper pole right kidney with scattered tiny T2 hyperintensities in both kidneys too small to characterize but likely benign. Stomach/Bowel: Nondilated. Diffuse distension of fluid-filled bowel seen throughout the abdomen. Small bowel loops measure up to 3.9 cm diameter. Vascular/Lymphatic: No abdominal aortic aneurysm. No discernible lymphadenopathy in the abdomen. Other:  No substantial intraperitoneal free fluid. Musculoskeletal: No suspicious marrow enhancement within the visualized bony anatomy. IMPRESSION: 1. Markedly motion degraded study. Exam is nondiagnostic for small lesions in the solid abdominal organs. 2. 7 mm T2 hyperintense focus in the head of pancreas, similar to recent CT. This lesion is obscured on postcontrast imaging. The remaining tiny lesions seen in the pancreas on previous CT are also obscured by motion artifact. Given patient inability to reproducibly breath hold and remain still, using CT with its better temporal resolution for follow-up is recommended. For cystic lesions of this size in a patient of this age, consensus guidelines recommend repeat imaging every 2 years. This recommendation follows ACR consensus guidelines: Management of Incidental Pancreatic Cysts: A White Paper of the ACR  Incidental Findings Committee. [place] 1610;96:[id number]. 3. Cholelithiasis. No biliary dilatation. 4. Diffuse distension of fluid-filled bowel loops in the abdomen. Small bowel obstruction cannot be excluded. Electronically Signed   By: Misty Stanley M.D.   On: 11/03/2019 09:23    Orson Eva, DO  [facility]  If 7PM-7AM, please contact night-coverage [email or web address] Password [email or web address] 11/12/2019, 2:34 PM   LOS: 10 days

## 2019-11-12 NOTE — Progress Notes (Signed)
1130: Pt assisted up to recliner for lunch, required standby assist x2 to transfer. Pt tolerated 75% of clear liquid diet, but continues to "spit up" as per pt. Only seeing frothy sputum in emesis bag. 1255: Pt back to bed per her request, only required one standby assist to go back to bed. Repositioned in bed on left side to relieve pain in buttocks from sitting. Pt denies any other c/o at present.

## 2019-11-12 NOTE — Progress Notes (Signed)
[facility]  Talked to [facility] for 20 minutes, [phone]. Discussed current state, malnutrition/ failure to thrive and request for feeding tube, issues with doing minimally invasive approach due to bowel dilation and anatomy and need for open procedure. Discussed J tube versus GJ tube and discussed cholecystectomy and liver biopsy at the same time given the risk of general anesthesia already being taken.   Discussed risk associated with anesthesia, breathing tube and issue with extubation, discussed potential need for trach, discussed that she would be in a SNF/Rehab and likely never get out. Discussed risk of bleeding, infection, inability to heal due to frailty and low albumin/ nutrition status, discussed risk of complication due to steroids and also risk of not healing for these. Discussed fact that the feeding tube may not change any the trajectory of the failure to thrive and malnutrition and that there is some research that shows this fact.   Discussed that patient is DNR and that she does want everything based on my conversations right now. Discussed that she indicated at least once that she would want a breathing tube, possible trach and life long vent.   Discussed Palliative consult for next week before surgery to further discuss. Discussed that she is having low grade fevers now and plan for CT imaging of the chest to evaluate for bronchitis. This would also increase risk of intubation and unlikelihood of extubation.   Discussed if we are doing CT chest to repeat CT a/p to ensure no changes or options that would make just a feeding tube easier. Given the bowel dilation from Ileus last time but it is unlikely things have changed.   Discussed that anesthesia may not want to intubate if active bronchitis.   Will continue to discuss with family and patient. Plan to call back Kerry Dory and his wife Monday Morning.  Ms. Salmons had no complaints.  BP 113/69 (BP Location: Right  Arm)   Pulse 81   Temp 99.4 F (37.4 C) (Oral)   Resp 16   Ht 5\' 8"  (1.727 m)   Wt 69.6 kg   SpO2 100%   BMI 23.33 kg/m  Abd soft, nondistended,non tender  MAE O2 in nares  Discussed above with Dr. Carles Collet.  CT c/a/p.  Palliative consult in place, will be available Tuesday.   Greater than 50% of the 35 minute visit was spent in counseling/ coordination of care regarding the patient's option for procedure and discussion with Dr. Carles Collet and the son and the patient.   Curlene Labrum, MD [facility] [address] [place], [place] [address] [phone] (office)

## 2019-11-13 DIAGNOSIS — Z7189 Other specified counseling: Secondary | ICD-10-CM

## 2019-11-13 LAB — GLUCOSE, CAPILLARY
Glucose-Capillary: 111 mg/dL — ABNORMAL HIGH (ref 70–99)
Glucose-Capillary: 64 mg/dL — ABNORMAL LOW (ref 70–99)
Glucose-Capillary: 50 mg/dL — ABNORMAL LOW (ref 70–99)
Glucose-Capillary: 66 mg/dL — ABNORMAL LOW (ref 70–99)
Glucose-Capillary: 86 mg/dL (ref 70–99)
Glucose-Capillary: 128 mg/dL — ABNORMAL HIGH (ref 70–99)
Glucose-Capillary: 106 mg/dL — ABNORMAL HIGH (ref 70–99)

## 2019-11-13 LAB — T4, FREE: Free T4: 1.26 ng/dL — ABNORMAL HIGH (ref 0.61–1.12)

## 2019-11-13 LAB — TSH: TSH: 4.395 u[IU]/mL (ref 0.350–4.500)

## 2019-11-13 MED ORDER — FUROSEMIDE 10 MG/ML IJ SOLN
40.0000 mg | Freq: Two times a day (BID) | INTRAMUSCULAR | Status: DC
  Administered 2019-11-13 – 2019-11-18 (×10): 40 mg via INTRAVENOUS
  Filled 2019-11-13 (×11): qty 4

## 2019-11-13 NOTE — Progress Notes (Addendum)
PROGRESS NOTE  [name] BUL:[id number] DOB: 26-Nov-1950 DOA: 11/01/2019 PCP: Maryruth Hancock, MD  Brief History: 69 y.o.femalewith medical history significant forCOPD asthma with chronic respiratory failure on 3 L, paroxysmal atrial fibrillation,HOCM,liver cirrhosis, diabetes mellitus. Patient was sent to the ED via EMS from her endocrinologist'soffice with blood sugar of 32,she was given D50 and blood sugar increased to 47, but on arrival to the ED, her blood sugar was 20.Patient has hadprofound weight loss of greater than 150 pounds in the past 2 years (weight stable last 6 months), dysphagia to solids/pills, recent diagnosis of scleroderma.  Over the past 3 months she has been vomiting every day, at least 2-3 times a day, she has been vomiting almostevery thing shetries to eat,withresulted in weight loss. She reports chronic intermittent abdominal pain. But she has no abdominal pain this admission. No loose stools. Patient is not on any medications for her diabetes.  She has chronic lower extremity swelling that that fluctuates but is currently unchanged,no chest pain, no difficulty breathing.  She continued to having vomiting during this admission despite conservative measures.  IR did not feel her anatomy was conducive for PEG.  General surgery has been consulted to place open gastrostomy tube.  Palliative medicine was consulted to discuss [place].  Assessment/Plan: Hypoglycemia/FTT/Intractable Nausea/vomiting -Dr. Roderic Palau discussed with endocrineDr. Dorris Fetch. Felt that hypoglycemia is related to severe malnutrition and depleted glycogen stores. Recommendations were to keep the patient on dextrose infusion while in the hospital. Also, keep gloves on patient's hands to help keep them warm. This may improve accuracy of CBG. He recommended holding off on further work-up for insulinoma in current setting since results may be skewed. This will be further followed  up as an outpatient. Overall blood sugars are better with dextrose infusion.Need to ensure that patient is getting adequate enteral nutrition prior to discontinuing dextrose infusion. -continue D5/0.9NS  Intractable nausea/vomiting -worsen last 3 months -related to severe reflux esophagitis and gastroparesis -05/19/19--EGD--severe reflux esophagitis -GES--confirms gastroparesis -reglan increased to 7.5 mg tid -GI following and assisting with further work-up -Dr. Laural Golden agrees with PEG/PEJ -IR consulted-->anatomy not conducive for PEC -pt continues to vomiting and having difficulty tolerating clear liquids; many times she is "spitting up"  -consult general surgery--planning open G-tube 7/7 -7/4--case discussed with Dr. Constance Haw -repeat CT abd/pelv--Diffuse small bowel distension more pronounced in the LEFT and central abdomen similar to the previous exam; diffuse anasarca -clinically not obstructed -7/3 CT chest--Signs of interstitial lung disease with bronchiectatic changes at lung bases and subpleural ground-glass and reticulation  Hypertrophic CM -appreciate cardiology consult-->From a purely cardiac standpoint there is no contraindication to her surgery, defer considerations regarding her noncardiac comorbidities to primary team. No additional cardaic testing planned at this time.  Diabetes Mellitus type 2 -11/01/19 A1c--5.0 -no on any agents as outpt -hypoglycemic epidoses improved on IV dextrose infusion  Dilated SB loops -likely ileus, not clinically obstructed -6/29 CT abd/pelv--dilated fluid filled SB loops, many lie anterior to stomach.  Bibasilar bronchiectasis -passing flatus; had small BM this afternoon -discussed with Dr. Fredda Hammed CT abd/pelv--as above  Chronic respiratory failure with hypoxia -continue duonebs -normally on 3L at home  Pancreatic lesion: -MRCP this admission markedly motion degraded. Will need CT abd with pancreatic protocol in May 2022  as she did not have a good quality study with MRI in setting of motion degradation.  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 Unable to tolerate po. Still vomiting.SurgicalOpen gtube planned   Dispo: The patient is from:Home Anticipated d/c is DJ:TTSV Anticipated d/c date is:3days Patient currently is not medically stable to d/c.        Family Communication:updated son on phone 7/4  Consultants:GI, general surgery, cardiology; palliative  Code Status: DNR  DVT Prophylaxis: [place] Heparin    Procedures: As Listed in Progress Note Above  Antibiotics: None     Subjective: Patient continues to have episodes of vomiting small amounts of fluid with occasional spitting up.  Denies cp, sob, abd pain.  No diarrhea, f/c.  Objective: Vitals:   11/13/19 0435 11/13/19 0742 11/13/19 1422 11/13/19 1445  BP: 116/88  111/85   Pulse: 89  98   Resp: 16  17 20   Temp:   97.8 F (36.6 C)   TempSrc:   Oral   SpO2: 100% 100% (!) 87% 98%  Weight:      Height:        Intake/Output Summary (Last 24 hours) at 11/13/2019 1601 Last data filed at 11/13/2019 0651 Gross per 24 hour  Intake 2595.4 ml  Output 650 ml  Net 1945.4 ml   Weight change:  Exam:   General:  Pt is alert, follows commands appropriately, not in acute distress  HEENT: No icterus, No thrush, No neck mass, [place]/AT  Cardiovascular: RRR, S1/S2, no rubs, no gallops  Respiratory: bibasilar crackles. No  wheeze  Abdomen: Soft/+BS, non tender, non distended, no guarding  Extremities: 1+Le edema, No lymphangitis, No petechiae, No rashes, no synovitis   Data Reviewed: I have personally reviewed following labs and imaging studies Basic Metabolic Panel: Recent Labs  Lab 11/07/19 0437 11/08/19 0444 11/09/19 0459 11/10/19 0453 11/12/19 0513  NA 136 136 135 134* 137  K 4.2 4.0 4.0 3.4* 3.0*  CL 100 102 102 102 105  CO2 28 27 24 23 24   GLUCOSE 129* 127* 129* 145* 122*  BUN 16 16 18 19 16   CREATININE 1.09* 1.14* 1.19* 1.14* 0.99  CALCIUM 7.9* 7.6* 7.8* 7.8* 7.5*   Liver Function Tests: Recent Labs  Lab 11/07/19 0437 11/08/19 0444 11/09/19 0459 11/10/19 0453 11/12/19 0513  AST 11* 12* 13* 11* 10*  ALT 9 9 10 8 8   ALKPHOS 54 57 66 73 78  BILITOT 0.7 0.6 0.7 0.7 0.8  PROT 5.2* 5.3* 5.5* 5.5* 5.4*  ALBUMIN 2.2* 2.0* 2.0* 2.0* 1.7*   No results for input(s): LIPASE, AMYLASE in the last 168 hours. No results for input(s): AMMONIA in the last 168 hours. Coagulation Profile: No results for input(s): INR, PROTIME in the last 168 hours. CBC: Recent Labs  Lab 11/07/19 0437 11/09/19 0459 11/10/19 0453 11/12/19 0513  WBC 4.2 6.1 6.7 6.7  HGB 9.2* 9.3* 9.5* 8.3*  HCT 28.3* 27.2* 27.5* 24.9*  MCV 87.3 86.3 84.9 85.9  PLT 242 183 244 259   Cardiac Enzymes: No results for input(s): CKTOTAL, CKMB, CKMBINDEX, TROPONINI in the last 168 hours. BNP: Invalid input(s): POCBNP CBG: Recent Labs  Lab 11/13/19 0737 11/13/19 1138 11/13/19 1215 11/13/19 1217 11/13/19 1219  GLUCAP 64* 50* 31* 66* 86   HbA1C: No results for input(s): HGBA1C in the last 72 hours. Urine analysis:  Component Value Date/Time   COLORURINE YELLOW 08/17/2019 1134   APPEARANCEUR CLEAR 08/17/2019 1134   LABSPEC 1.006 08/17/2019 1134   PHURINE 7.0 08/17/2019 1134   GLUCOSEU NEGATIVE 08/17/2019 1134   GLUCOSEU NEGATIVE 11/30/2013 1057   HGBUR NEGATIVE 08/17/2019 1134   BILIRUBINUR NEGATIVE 12/09/2018  0923   KETONESUR NEGATIVE 08/17/2019 1134   PROTEINUR NEGATIVE 08/17/2019 1134   UROBILINOGEN 0.2 11/30/2013 1057   NITRITE NEGATIVE 08/17/2019 1134   LEUKOCYTESUR NEGATIVE 08/17/2019 1134   Sepsis Labs: @LABRCNTIP (procalcitonin:4,lacticidven:4) ) Recent Results (from the past 240 hour(s))  Culture, Urine     Status: Abnormal   Collection Time: 11/10/19  8:06 PM   Specimen: Urine, Random  Result Value Ref Range Status   Specimen Description   Final    URINE, RANDOM Performed at [facility], [address], [place], [place] [address]    Special Requests   Final    NONE Performed at [facility], [address] [place], [place] [address]    Culture MULTIPLE SPECIES PRESENT, SUGGEST RECOLLECTION (A)  Final   Report Status 11/12/2019 FINAL  Final     Scheduled Meds: . aspirin EC  81 mg Oral Daily  . carvedilol  25 mg Oral BID  . feeding supplement  1 Container Oral TID BM  . furosemide  20 mg Intravenous BID  . heparin  5,000 Units Subcutaneous Q8H  . latanoprost  1 drop Both Eyes QHS  . levothyroxine  88 mcg Oral QAC breakfast  . metoCLOPramide (REGLAN) injection  7.5 mg Intravenous TID AC  . mometasone-formoterol  2 puff Inhalation BID  . pantoprazole (PROTONIX) IV  40 mg Intravenous Q12H  . predniSONE  5 mg Oral Daily   Continuous Infusions: . dextrose 5 % and 0.9 % NaCl with KCl 20 mEq/L 75 mL/hr at 11/13/19 0741    Procedures/Studies: CT ABDOMEN WO CONTRAST  Result Date: 11/08/2019 CLINICAL DATA:  Assess anatomy for possible G-tube placement EXAM: CT ABDOMEN WITHOUT CONTRAST TECHNIQUE: Multidetector CT imaging of the abdomen was performed following the standard protocol without IV contrast. COMPARISON:  09/19/2019 FINDINGS: Lower chest: Small bilateral pleural effusions. Coronary artery and aortic calcifications. Bronchiectasis in the lower lobes. Hepatobiliary: No focal hepatic abnormality. Gallbladder unremarkable. Pancreas: No focal abnormality or ductal dilatation.  Spleen: No focal abnormality.  Normal size. 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 No hydronephrosis. No renal or adrenal mass. Stomach/Bowel: Small bowel loops are dilated and fluid-filled. Numerous small bowel loops as well as the colon overlie the stomach anteriorly and would make gastrostomy tube placement difficult. Diverticula seen within the colon. Vascular/Lymphatic: Aortic atherosclerosis. No enlarged abdominal lymph nodes. Other: No free fluid or free air. Musculoskeletal: No acute bony abnormality. IMPRESSION: Dilated, fluid-filled small bowel loops. Appearance is concerning for possible small bowel obstruction. Numerous small bowel loops and the colon lie anterior to the stomach and would make gastrostomy tube placement difficult. Small bilateral pleural effusions.  Bibasilar bronchiectasis. Colonic diverticulosis. Electronically Signed   By: Rolm Baptise M.D.   On: 11/08/2019 19:27   CT CHEST W CONTRAST  Result Date: 11/12/2019 CLINICAL DATA:  Dyspnea, unclear etiology, cough EXAM: CT CHEST, ABDOMEN, AND PELVIS WITH CONTRAST TECHNIQUE: Multidetector CT imaging of the chest, abdomen and pelvis was performed following the standard protocol during bolus administration of intravenous contrast. CONTRAST:  163mL OMNIPAQUE IOHEXOL 300 MG/ML  SOLN COMPARISON:  CT evaluation from April 29, 2018 and November 08, 2019 FINDINGS: CT CHEST FINDINGS Cardiovascular:  Heart size is stable. Three-vessel coronary artery disease. No pericardial effusion. Aorta is of normal caliber with calcified and noncalcified plaque similar to the prior study. Central pulmonary arteries mildly dilated otherwise unremarkable on venous phase assessment. Mediastinum/Nodes: Thoracic inlet structures with substernal thyroid extension. Variable attenuation of the substernal thyroid with similar appearance. This measures approximately 2.5 cm greatest axial dimension by 1.6 cm.  Esophagus is markedly patulous and fluid-filled which is similar to previous exam. Filled with ingested contrast material. No adenopathy in the mediastinum. No axillary lymphadenopathy. Lungs/Pleura: Signs of interstitial lung disease similar to previous imaging with bronchiectatic changes at the lung bases and subpleural ground-glass and reticulation. Pulmonary emphysema. Small moderate bilateral effusions. Airways are patent. Musculoskeletal: Osteopenia. Spinal degenerative changes. No acute musculoskeletal process. See below for full musculoskeletal detail. CT ABDOMEN PELVIS FINDINGS Hepatobiliary: Liver with mottled appearance on early venous phase. Portal vein is widely patent. No focal lesion in the liver. Gallbladder without pericholecystic stranding with small gallstones in the lumen. No biliary duct dilation. Pancreas: Small cystic lesions in the pancreas, largest approximately 7 mm. No main duct dilation beyond 3 mm 2 total lesions in the head of the pancreas. No peripancreatic stranding. Spleen: Spleen normal size without focal lesion. Adrenals/Urinary Tract: Adrenal glands are normal. Marked renal cortical scarring and heterogeneity related to scarring. Small cyst in the interpolar RIGHT kidney. Urinary bladder is markedly distended extending into the low abdomen. 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 Stomach/Bowel: Gastric under distension. Fluid and contrast in the distal esophagus. 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 Colonic diverticulosis. Appendix is normal. Vascular/Lymphatic: No aneurysmal dilation of the abdominal aorta. Scattered atheromatous plaque. No adenopathy. Reproductive: Uterus with mild heterogeneity, nonspecific finding perhaps related to underlying leiomyomata based on calcifications and pattern of enhancement. No adnexal mass. Other: Diffuse anasarca with body  wall edema grossly similar to recent imaging no abdominal wall hernia. Musculoskeletal: Spinal degenerative changes without acute or destructive bone process. IMPRESSION: 1. Signs of interstitial lung disease with bronchiectatic changes at the lung bases and subpleural ground-glass and reticulation. 2. Chronic dilation of the esophagus. 3. Bilateral effusions very slightly increased from recent imaging. 4. Markedly distended urinary bladder extending into the low abdomen. Correlate with any signs of bladder outlet obstruction. 5. Diffuse small bowel distension more pronounced in the LEFT and central abdomen similar to the previous exam may represent partial small bowel obstruction or related to ileus and/or partial small-bowel obstruction superimposed on chronic dilation in the setting of scleroderma perhaps slightly less dilated in the distal small bowel when compared to previous study from November 08, 2019. 6. Heterogeneity of hepatic enhancement in the early phase suggests congestive hepatopathy given that it largely normalizes on later phase. Correlation with hepatic enzymes may be helpful. 7. Small cystic lesions in the pancreas, largest approximately 7 mm. No main duct dilation beyond 3 mm 2 mm total lesions in the head of the pancreas. 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 Follow-up CT or MRI in 1 year is suggested. 8. Diffuse anasarca similar to the prior study. 9. Cholelithiasis. 10. Nodular enlargement of substernal thyroid. Recommend thyroid ultrasound correlation with previously performed thyroid ultrasound(ref: J Am Coll Radiol. 2015 Feb;12(2): 143-50). 11. 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 12. Three-vessel coronary artery disease. 13. Emphysema and aortic atherosclerosis. Aortic Atherosclerosis (ICD10-I70.0) and Emphysema (ICD10-J43.9). Electronically  Signed   By: Zetta Bills M.D.   On: 11/12/2019  18:24   NM GASTRIC EMPTYING  Result Date: 11/08/2019 CLINICAL DATA:  Refractory nausea, vomiting and abdominal pain for weeks, weight loss EXAM: NUCLEAR MEDICINE GASTRIC EMPTYING SCAN TECHNIQUE: After oral ingestion of radiolabeled meal, sequential abdominal images were obtained for 120 minutes. Residual percentage of activity remaining within the stomach was calculated at 60 and 120 minutes. Patient was planned to be a 4 hour exam but she terminated the procedure at 2 hours, refusing to complete additional imaging. After oral ingestion of radiolabeled meal, sequential abdominal images were obtained for 4 hours. Percentage of activity emptying the stomach was calculated at 1 hour, 2 hour, 3 hour, and 4 hours. RADIOPHARMACEUTICALS:  2 mCi Tc-89m sulfur colloid in standardized meal COMPARISON:  12/10/2018 FINDINGS: Expected location of the stomach in the left upper quadrant. Some retained contrast in distal esophagus versus hiatal hernia throughout exam. Ingested meal empties the stomach slowly over the course of the study. 3% emptying at 1 hour. 30% emptying at 2 hours. Findings represent delayed gastric emptying at 2 hours. IMPRESSION: Delayed gastric emptying at 2 hours. Patient refused completion of the final 2 hours of the 4 hour exam. Electronically Signed   By: Lavonia Dana M.D.   On: 11/08/2019 14:14   CT ABDOMEN PELVIS W CONTRAST  Result Date: 11/12/2019 CLINICAL DATA:  Dyspnea, unclear etiology, cough EXAM: CT CHEST, ABDOMEN, AND PELVIS WITH CONTRAST TECHNIQUE: Multidetector CT imaging of the chest, abdomen and pelvis was performed following the standard protocol during bolus administration of intravenous contrast. CONTRAST:  146mL OMNIPAQUE IOHEXOL 300 MG/ML  SOLN COMPARISON:  CT evaluation from April 29, 2018 and November 08, 2019 FINDINGS: CT CHEST FINDINGS Cardiovascular: Heart size is stable. Three-vessel coronary artery disease. No pericardial effusion. Aorta is of normal caliber with calcified  and noncalcified plaque similar to the prior study. Central pulmonary arteries mildly dilated otherwise unremarkable on venous phase assessment. Mediastinum/Nodes: Thoracic inlet structures with substernal thyroid extension. Variable attenuation of the substernal thyroid with similar appearance. This measures approximately 2.5 cm greatest axial dimension by 1.6 cm. Esophagus is markedly patulous and fluid-filled which is similar to previous exam. Filled with ingested contrast material. No adenopathy in the mediastinum. No axillary lymphadenopathy. Lungs/Pleura: Signs of interstitial lung disease similar to previous imaging with bronchiectatic changes at the lung bases and subpleural ground-glass and reticulation. Pulmonary emphysema. Small moderate bilateral effusions. Airways are patent. Musculoskeletal: Osteopenia. Spinal degenerative changes. No acute musculoskeletal process. See below for full musculoskeletal detail. CT ABDOMEN PELVIS FINDINGS Hepatobiliary: Liver with mottled appearance on early venous phase. Portal vein is widely patent. No focal lesion in the liver. Gallbladder without pericholecystic stranding with small gallstones in the lumen. No biliary duct dilation. Pancreas: Small cystic lesions in the pancreas, largest approximately 7 mm. No main duct dilation beyond 3 mm 2 total lesions in the head of the pancreas. No peripancreatic stranding. Spleen: Spleen normal size without focal lesion. Adrenals/Urinary Tract: Adrenal glands are normal. Marked renal cortical scarring and heterogeneity related to scarring. Small cyst in the interpolar RIGHT kidney. Urinary bladder is markedly distended extending into the low abdomen. 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 Stomach/Bowel: Gastric under distension. Fluid and contrast in the distal esophagus. 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  Colonic diverticulosis. Appendix is normal. Vascular/Lymphatic: No aneurysmal dilation of the abdominal aorta. Scattered atheromatous plaque.  No adenopathy. Reproductive: Uterus with mild heterogeneity, nonspecific finding perhaps related to underlying leiomyomata based on calcifications and pattern of enhancement. No adnexal mass. Other: Diffuse anasarca with body wall edema grossly similar to recent imaging no abdominal wall hernia. Musculoskeletal: Spinal degenerative changes without acute or destructive bone process. IMPRESSION: 1. Signs of interstitial lung disease with bronchiectatic changes at the lung bases and subpleural ground-glass and reticulation. 2. Chronic dilation of the esophagus. 3. Bilateral effusions very slightly increased from recent imaging. 4. Markedly distended urinary bladder extending into the low abdomen. Correlate with any signs of bladder outlet obstruction. 5. Diffuse small bowel distension more pronounced in the LEFT and central abdomen similar to the previous exam may represent partial small bowel obstruction or related to ileus and/or partial small-bowel obstruction superimposed on chronic dilation in the setting of scleroderma perhaps slightly less dilated in the distal small bowel when compared to previous study from November 08, 2019. 6. Heterogeneity of hepatic enhancement in the early phase suggests congestive hepatopathy given that it largely normalizes on later phase. Correlation with hepatic enzymes may be helpful. 7. Small cystic lesions in the pancreas, largest approximately 7 mm. No main duct dilation beyond 3 mm 2 mm total lesions in the head of the pancreas. 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 Follow-up CT or MRI in 1 year is suggested. 8. Diffuse anasarca similar to the prior study. 9. Cholelithiasis. 10. Nodular enlargement of substernal thyroid. Recommend thyroid ultrasound correlation with previously performed thyroid  ultrasound(ref: J Am Coll Radiol. 2015 Feb;12(2): 143-50). 11. 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 12. Three-vessel coronary artery disease. 13. Emphysema and aortic atherosclerosis. Aortic Atherosclerosis (ICD10-I70.0) and Emphysema (ICD10-J43.9). Electronically Signed   By: Zetta Bills M.D.   On: 11/12/2019 18:24   MR 3D Recon At Scanner  Result Date: 11/03/2019 CLINICAL DATA:  Cystic lesions in the pancreas seen on CT scan. EXAM: MRI ABDOMEN WITHOUT AND WITH CONTRAST (INCLUDING MRCP) TECHNIQUE: Multiplanar multisequence MR imaging of the abdomen was performed both before and after the administration of intravenous contrast. Heavily T2-weighted images of the biliary and pancreatic ducts were obtained, and three-dimensional MRCP images were rendered by post processing. CONTRAST:  48mL GADAVIST GADOBUTROL 1 MMOL/ML IV SOLN COMPARISON:  Abdominal CT 09/19/2019 FINDINGS: Markedly motion degraded study. Lower chest: Probable small right pleural effusion. Hepatobiliary: No gross abnormality within the liver parenchyma. Assessment of liver parenchyma on postcontrast imaging is motion degraded. 6 mm probable gallstone noted in the dependent gallbladder lumen. Common bile duct measures 5 mm diameter in the head of pancreas. Pancreas: 7 mm T2 hyperintense focus identified in the head of pancreas, similar to recent CT. This lesion is obscured by motion artifact on postcontrast imaging. The remaining tiny lesions seen in the pancreas on previous CT are also obscured. No dilatation of the main duct. Spleen:  No gross abnormality. Adrenals/Urinary Tract: No gross enhancing adrenal or renal mass lesion. No evidence for hydronephrosis. 13 mm probable cyst upper pole right kidney with scattered tiny T2 hyperintensities in both kidneys too small to characterize but likely benign. Stomach/Bowel: Nondilated. Diffuse distension of fluid-filled bowel seen throughout the  abdomen. Small bowel loops measure up to 3.9 cm diameter. Vascular/Lymphatic: No abdominal aortic aneurysm. No discernible lymphadenopathy in the abdomen. Other:  No substantial intraperitoneal free fluid. Musculoskeletal: No suspicious marrow enhancement within the visualized bony anatomy. IMPRESSION: 1. Markedly motion degraded study. Exam is nondiagnostic for small lesions in  the solid abdominal organs. 2. 7 mm T2 hyperintense focus in the head of pancreas, similar to recent CT. This lesion is obscured on postcontrast imaging. The remaining tiny lesions seen in the pancreas on previous CT are also obscured by motion artifact. Given patient inability to reproducibly breath hold and remain still, using CT with its better temporal resolution for follow-up is recommended. For cystic lesions of this size in a patient of this age, consensus guidelines recommend repeat imaging every 2 years. This recommendation follows ACR consensus guidelines: Management of Incidental Pancreatic Cysts: A White Paper of the ACR Incidental Findings Committee. [place] 8546;27:035-009. 3. Cholelithiasis. No biliary dilatation. 4. Diffuse distension of fluid-filled bowel loops in the abdomen. Small bowel obstruction cannot be excluded. Electronically Signed   By: Misty Stanley M.D.   On: 11/03/2019 09:23   DG CHEST PORT 1 VIEW  Result Date: 11/12/2019 CLINICAL DATA:  Hypoxia. EXAM: PORTABLE CHEST 1 VIEW COMPARISON:  05/26/2019 FINDINGS: Lungs are hypoinflated without lobar consolidation or effusion. Mild diffuse interstitial prominence is present and slightly more pronounced although this change may related to the degree of hypoinflation. Cardiomediastinal silhouette and remainder of the exam is unchanged. IMPRESSION: Hypoinflation with mild diffuse interstitial prominence likely chronic. Acute bronchitic process is possible. Electronically Signed   By: Marin Olp M.D.   On: 11/12/2019 08:37   MR ABDOMEN MRCP W WO  CONTAST  Result Date: 11/03/2019 CLINICAL DATA:  Cystic lesions in the pancreas seen on CT scan. EXAM: MRI ABDOMEN WITHOUT AND WITH CONTRAST (INCLUDING MRCP) TECHNIQUE: Multiplanar multisequence MR imaging of the abdomen was performed both before and after the administration of intravenous contrast. Heavily T2-weighted images of the biliary and pancreatic ducts were obtained, and three-dimensional MRCP images were rendered by post processing. CONTRAST:  58mL GADAVIST GADOBUTROL 1 MMOL/ML IV SOLN COMPARISON:  Abdominal CT 09/19/2019 FINDINGS: Markedly motion degraded study. Lower chest: Probable small right pleural effusion. Hepatobiliary: No gross abnormality within the liver parenchyma. Assessment of liver parenchyma on postcontrast imaging is motion degraded. 6 mm probable gallstone noted in the dependent gallbladder lumen. Common bile duct measures 5 mm diameter in the head of pancreas. Pancreas: 7 mm T2 hyperintense focus identified in the head of pancreas, similar to recent CT. This lesion is obscured by motion artifact on postcontrast imaging. The remaining tiny lesions seen in the pancreas on previous CT are also obscured. No dilatation of the main duct. Spleen:  No gross abnormality. Adrenals/Urinary Tract: No gross enhancing adrenal or renal mass lesion. No evidence for hydronephrosis. 13 mm probable cyst upper pole right kidney with scattered tiny T2 hyperintensities in both kidneys too small to characterize but likely benign. Stomach/Bowel: Nondilated. Diffuse distension of fluid-filled bowel seen throughout the abdomen. Small bowel loops measure up to 3.9 cm diameter. Vascular/Lymphatic: No abdominal aortic aneurysm. No discernible lymphadenopathy in the abdomen. Other:  No substantial intraperitoneal free fluid. Musculoskeletal: No suspicious marrow enhancement within the visualized bony anatomy. IMPRESSION: 1. Markedly motion degraded study. Exam is nondiagnostic for small lesions in the solid  abdominal organs. 2. 7 mm T2 hyperintense focus in the head of pancreas, similar to recent CT. This lesion is obscured on postcontrast imaging. The remaining tiny lesions seen in the pancreas on previous CT are also obscured by motion artifact. Given patient inability to reproducibly breath hold and remain still, using CT with its better temporal resolution for follow-up is recommended. For cystic lesions of this size in a patient of this age, consensus guidelines  recommend repeat imaging every 2 years. This recommendation follows ACR consensus guidelines: Management of Incidental Pancreatic Cysts: A White Paper of the ACR Incidental Findings Committee. [place] 9381;01:751-025. 3. Cholelithiasis. No biliary dilatation. 4. Diffuse distension of fluid-filled bowel loops in the abdomen. Small bowel obstruction cannot be excluded. Electronically Signed   By: Misty Stanley M.D.   On: 11/03/2019 09:23    Orson Eva, DO  [facility]  If 7PM-7AM, please contact night-coverage [email or web address] Password [email or web address] 11/13/2019, 4:01 PM   LOS: 11 days

## 2019-11-13 NOTE — Progress Notes (Signed)
Had [place] discussion with son Dannielle Huh) and his wife [name]) who is Therapist, sports at bedside. Explained that patient is higher risk surgical candidate due to her chronic resp failure/COPD/bronchiectasis, HOCM, and her frailty and deconditioning with recent 100lb weight loss.  Discussed risks/benefits and alternatives.  Risks including but not limited to chronic respiratory failure and vent dependence leading to tracheostomy. Daughter in law Chief Operating Officer) expresses understanding and has good medical insight into the patient's situation and risks involved.  She has deferred ultimate decisions to her husband and told him to have a "heart to heart" talk with the patient regarding what she wants done and the risks involved.  Peggy and Dannielle Huh also expressed understanding that other option is to defer the surgery and pursue a more comfort directed approach.  Entire conversation was at bedside with patient, and patient continues to appear aloof and apathetic regarding the serious nature of the situation. I have asked Kelvin to please continue to speak with the patient and informed them that our palliative medicine team will be in touch with them this week, likely Tuesday.  DTat  Total time 35 min

## 2019-11-13 NOTE — Progress Notes (Signed)
Called to patients room for c/o n/v. Arrived to find that patient had vomited small amount of yellow colored fluid with small clumps of milk noted. Pt's face and gown cleaned, Zofran 4mg  IV given for nausea. Also, HOB raised slightly (pt was lying almost flat in bed when I entered room). Son at bedside.

## 2019-11-13 NOTE — Progress Notes (Signed)
1150: NT reports blood glucose reading of 50mg /dl. Pt given 1 cup of OJ to drink, tolerated 1/2 cup. Pt alert, oriented, skin warm and dry. Pt's lunch tray delivered, pt set up and eating full liquids. 1215: Blood sugar rechecked with reading of 31 mg/sl. Again, pt eating lunch, alert and oriented, so s/s hypoglycemia. Blood sugar rechecked at 1217 in different finger of left hand with reading of 66 mg/sl. Pt asks for sugar to be rechecked in right hand, this was done at 1219 with reading of 86 mg/dl. Pt continues to eat lunch meal.

## 2019-11-13 NOTE — Progress Notes (Signed)
Dr. Constance Haw note from yesterday appreciated. Patient had chest CT with multiple findings which include interstitial lung disease with bronchiectasis at lung bases dilated esophagus bilateral pleural effusions distended urinary bladder diffuse small bowel distention heterogenic liver possibly due to congestive hepatopathy.  Small cystic lesion in pancreas. Patient's diet was advanced to full liquids yesterday.  Patient reports 3 episodes of vomiting since this morning.  She says all she vomits is phlegm.  No food debris noted.  She denies abdominal pain.  She states her bowels are moving.  When I saw her she was on a bedpan. Abdominal exam reveals soft abdomen without tenderness organomegaly or masses. She does not have tremors involving lips tongue or her hands. TSH level is 4.395 which is within therapeutic range.  It was over 1012 days ago. This means she is absorbing Synthroid that she is getting by mouth.  Major issue now is malnutrition due to severe gastroparesis.  She is on metoclopramide IV at a dose of 7.5 mg before each meal and I am not sure that it is making a great deal of difference.  Unfortunately risk of doing nothing exceeds the risk of placing jejunal feeding tube. No final decision made yet.

## 2019-11-14 LAB — COMPREHENSIVE METABOLIC PANEL
ALT: 9 U/L (ref 0–44)
AST: 11 U/L — ABNORMAL LOW (ref 15–41)
Albumin: 1.8 g/dL — ABNORMAL LOW (ref 3.5–5.0)
Alkaline Phosphatase: 80 U/L (ref 38–126)
Anion gap: 11 (ref 5–15)
BUN: 15 mg/dL (ref 8–23)
CO2: 21 mmol/L — ABNORMAL LOW (ref 22–32)
Calcium: 8 mg/dL — ABNORMAL LOW (ref 8.9–10.3)
Chloride: 107 mmol/L (ref 98–111)
Creatinine, Ser: 0.93 mg/dL (ref 0.44–1.00)
GFR calc Af Amer: >60 mL/min (ref >60)
GFR calc non Af Amer: >60 mL/min (ref >60)
Glucose, Bld: 141 mg/dL — ABNORMAL HIGH (ref 70–99)
Potassium: 3.4 mmol/L — ABNORMAL LOW (ref 3.5–5.1)
Sodium: 139 mmol/L (ref 135–145)
Total Bilirubin: 0.7 mg/dL (ref 0.3–1.2)
Total Protein: 5.7 g/dL — ABNORMAL LOW (ref 6.5–8.1)

## 2019-11-14 LAB — GLUCOSE, CAPILLARY
Glucose-Capillary: 112 mg/dL — ABNORMAL HIGH (ref 70–99)
Glucose-Capillary: 127 mg/dL — ABNORMAL HIGH (ref 70–99)
Glucose-Capillary: 127 mg/dL — ABNORMAL HIGH (ref 70–99)
Glucose-Capillary: 85 mg/dL (ref 70–99)
Glucose-Capillary: 94 mg/dL (ref 70–99)
Glucose-Capillary: 99 mg/dL (ref 70–99)

## 2019-11-14 LAB — MAGNESIUM: Magnesium: 1.3 mg/dL — ABNORMAL LOW (ref 1.7–2.4)

## 2019-11-14 MED ORDER — POTASSIUM CHLORIDE 10 MEQ/100ML IV SOLN
10.0000 meq | INTRAVENOUS | Status: AC
  Administered 2019-11-14 (×4): 10 meq via INTRAVENOUS
  Filled 2019-11-14: qty 100

## 2019-11-14 MED ORDER — METOCLOPRAMIDE HCL 5 MG/ML IJ SOLN
10.0000 mg | Freq: Three times a day (TID) | INTRAMUSCULAR | Status: DC
  Administered 2019-11-14 – 2019-11-21 (×21): 10 mg via INTRAVENOUS
  Filled 2019-11-14 (×21): qty 2

## 2019-11-14 MED ORDER — MAGNESIUM SULFATE 4 GM/100ML IV SOLN
4.0000 g | Freq: Once | INTRAVENOUS | Status: AC
  Administered 2019-11-14: 4 g via INTRAVENOUS
  Filled 2019-11-14: qty 100

## 2019-11-14 MED ORDER — MAGIC MOUTHWASH W/LIDOCAINE
5.0000 mL | Freq: Three times a day (TID) | ORAL | Status: AC
  Administered 2019-11-14 – 2019-11-16 (×7): 5 mL via ORAL
  Filled 2019-11-14 (×9): qty 5

## 2019-11-14 NOTE — Plan of Care (Signed)
  Problem: Acute Rehab PT Goals(only PT should resolve) Goal: Pt Will Go Supine/Side To Sit Outcome: Progressing Flowsheets (Taken 11/14/2019 1459) Pt will go Supine/Side to Sit: with minimal assist Goal: Pt Will Go Sit To Supine/Side Outcome: Progressing Flowsheets (Taken 11/14/2019 1459) Pt will go Sit to Supine/Side: with minimal assist Goal: Patient Will Transfer Sit To/From Stand Outcome: Progressing Flowsheets (Taken 11/14/2019 1459) Patient will transfer sit to/from stand: with moderate assist Goal: Pt Will Transfer Bed To Chair/Chair To Bed Outcome: Progressing Flowsheets (Taken 11/14/2019 1459) Pt will Transfer Bed to Chair/Chair to Bed: with mod assist Goal: Pt Will Ambulate Outcome: Progressing Flowsheets (Taken 11/14/2019 1459) Pt will Ambulate:  15 feet  with moderate assist  with least restrictive assistive device  3:01 PM, 11/14/19 Mearl Latin PT, DPT Physical Therapist at [facility]

## 2019-11-14 NOTE — NC FL2 (Signed)
[employer] LEVEL OF CARE SCREENING TOOL     IDENTIFICATION  Patient Name: [name] Birthdate: [birth year] Sex: female Admission Date (Current Location): 11/01/2019  [place] and [place] Number:  [place] [id number] [place] and Address:  [facility] [address] [place]      Provider Number: [id number]  Attending Physician Name and Address:  Kayleen Memos, DO  Relative Name and Phone Number:       Current Level of Care: Hospital Recommended Level of Care: [place] Prior Approval Number:    Date Approved/Denied:   PASRR Number: pending  Discharge Plan: SNF    Current Diagnoses: Patient Active Problem List   Diagnosis Date Noted  . Goals of care, counseling/discussion 11/13/2019  . Preoperative cardiovascular examination   . Adult failure to thrive   . Abnormal CT scan   . Intractable vomiting   . Hypoglycemia 11/01/2019  . Pancreatic cyst 10/17/2019  . Esophageal dysmotility 09/29/2019  . Rib pain on right side 08/18/2019  . Diarrhea 08/12/2019  . Loss of weight 04/28/2019  . Paroxysmal atrial fibrillation ([place]) 03/01/2019  . Multinodular goiter 01/26/2019  . Regurgitation of food 01/21/2019  . Calculus of gallbladder without cholecystitis without obstruction 01/21/2019  . Numbness and tingling in both hands 12/25/2018  . Chronic cholecystitis with calculus 12/16/2018  . Abnormal LFTs 12/16/2018  . Abnormal weight loss 12/16/2018  . Upper abdominal pain   . Elevated transaminase level   . Hepatic cirrhosis ([place])   . Nausea and vomiting in adult   . Acute cholecystitis 12/09/2018  . Pyrosis 03/12/2018  . History of Helicobacter pylori infection 02/01/2018  . Dysphagia 02/01/2018  . Anxiety and depression   . Hypothyroidism 12/22/2017  . ILD (interstitial lung disease) ([place]) 11/25/2017  . Chronic respiratory failure with hypoxia ([place]) 11/12/2017  . Pericardial effusion   . Paresthesia  10/19/2017  . Neck pain 10/19/2017  . PCP NOTES >>> 02/23/2015  . Nocturnal oxygen desaturation 01/02/2015  . Asthma, chronic 01/02/2015  . Gastroesophageal reflux disease 07/25/2013  . DOE (dyspnea on exertion) 04/25/2013  . Internal hemorrhoids without mention of complication 37/02/6268  . Medicare annual wellness visit, subsequent 06/06/2011  . Anemia 12/10/2009  . SKIN LESION 08/06/2009  . Hyperlipidemia 01/11/2009  . Depression 09/26/2008  . INSOMNIA 09/26/2008  . OBSTRUCTIVE SLEEP APNEA 06/23/2008  . Chest pain 11/18/2007  . Type 2 diabetes mellitus without complication, without long-term current use of insulin ([place]) 10/06/2006  . Essential hypertension 10/06/2006  . Osteoarthritis 10/06/2006    Orientation RESPIRATION BLADDER Height & Weight     Self, Time, Situation, Place  O2 (2L) Incontinent Weight: 153 lb 7 oz (69.6 kg) Height:  5\' 8"  (172.7 cm)  BEHAVIORAL SYMPTOMS/MOOD NEUROLOGICAL BOWEL NUTRITION STATUS      Continent Diet (Full liquid (see d/c summary for updates))  AMBULATORY STATUS COMMUNICATION OF NEEDS Skin   Extensive Assist Verbally Normal                       Personal Care Assistance Level of Assistance  Bathing, Feeding, Dressing Bathing Assistance: Maximum assistance Feeding assistance: Limited assistance Dressing Assistance: Maximum assistance     Functional Limitations Info  Sight, Hearing, Speech Sight Info: Adequate Hearing Info: Adequate Speech Info: Adequate    SPECIAL CARE FACTORS FREQUENCY  PT (By licensed PT)     PT Frequency: daily  Contractures      Additional Factors Info  Code Status, Allergies, Psychotropic Code Status Info: DNR Allergies Info: No known allergies. Psychotropic Info: Lexapro         Current Medications (11/14/2019):  This is the current hospital active medication list Current Facility-Administered Medications  Medication Dose Route Frequency Provider Last Rate Last Admin  .  acetaminophen (TYLENOL) tablet 650 mg  650 mg Oral Q6H PRN Emokpae, Ejiroghene E, MD   650 mg at 11/09/19 0539   Or  . acetaminophen (TYLENOL) suppository 650 mg  650 mg Rectal Q6H PRN Emokpae, Ejiroghene E, MD      . aspirin EC tablet 81 mg  81 mg Oral Daily Emokpae, Ejiroghene E, MD   81 mg at 11/14/19 0943  . carvedilol (COREG) tablet 25 mg  25 mg Oral BID Emokpae, Ejiroghene E, MD   25 mg at 11/14/19 0943  . dextrose 5 % and 0.9 % NaCl with KCl 20 mEq/L infusion   Intravenous Continuous Kathie Dike, MD 75 mL/hr at 11/14/19 1150 New Bag at 11/14/19 1150  . feeding supplement (BOOST / RESOURCE BREEZE) liquid 1 Container  1 Container Oral TID BM Orson Eva, MD   1 Container at 11/14/19 0941  . furosemide (LASIX) injection 40 mg  40 mg Intravenous BID Tat, Shanon Brow, MD   40 mg at 11/14/19 0942  . heparin injection 5,000 Units  5,000 Units Subcutaneous Q8H Emokpae, Ejiroghene E, MD   5,000 Units at 11/14/19 1245  . ipratropium-albuterol (DUONEB) 0.5-2.5 (3) MG/3ML nebulizer solution 3 mL  3 mL Nebulization Q4H PRN Emokpae, Ejiroghene E, MD      . latanoprost (XALATAN) 0.005 % ophthalmic solution 1 drop  1 drop Both Eyes QHS Emokpae, Ejiroghene E, MD   1 drop at 11/13/19 2229  . levothyroxine (SYNTHROID) tablet 88 mcg  88 mcg Oral QAC breakfast Emokpae, Ejiroghene E, MD   88 mcg at 11/14/19 0606  . magic mouthwash w/lidocaine  5 mL Oral TID Irene Pap N, DO   5 mL at 11/14/19 1100  . metoCLOPramide (REGLAN) injection 10 mg  10 mg Intravenous TID AC Rourk, Cristopher Estimable, MD      . mometasone-formoterol [facility]) 200-5 MCG/ACT inhaler 2 puff  2 puff Inhalation BID [place] Brick, Ejiroghene E, MD   2 puff at 11/14/19 0812  . ondansetron (ZOFRAN) tablet 4 mg  4 mg Oral Q6H PRN Emokpae, Ejiroghene E, MD       Or  . ondansetron (ZOFRAN) injection 4 mg  4 mg Intravenous Q6H PRN Emokpae, Ejiroghene E, MD   4 mg at 11/14/19 0610  . pantoprazole (PROTONIX) injection 40 mg  40 mg Intravenous Q12H Rogene Houston, MD    40 mg at 11/14/19 0942  . polyethylene glycol (MIRALAX / GLYCOLAX) packet 17 g  17 g Oral Daily PRN Emokpae, Ejiroghene E, MD      . predniSONE (DELTASONE) tablet 5 mg  5 mg Oral Daily Emokpae, Ejiroghene E, MD   5 mg at 11/14/19 1308     Discharge Medications: Please see discharge summary for a list of discharge medications.  Relevant Imaging Results:  Relevant Lab Results:   Additional Information SSN: [id number].  Salome Arnt, LCSW

## 2019-11-14 NOTE — Evaluation (Signed)
Physical Therapy Evaluation Patient Details Name: [name] MRN: [id number] DOB: [birth year] Today's Date: 11/14/2019   History of Present Illness  [name] is a 69 y.o. female with medical history significant for COPD asthma with chronic respiratory failure on 3 L, paroxysmal atrial fibrillation, HOCM,liver cirrhosis, diabetes mellitus.Patient was sent to the ED via EMS from her endocrinologist's office with blood sugar of 32, she was given D50 and blood sugar increased to 47, but on arrival to the ED, her blood sugar was 20.Over the past 3 months she has been vomiting every day, at least 2-3 times a day, she has been vomiting almost every thing she tries to eat, with resulted in weight loss.  She reports chronic intermittent abdominal pain.  But she has no abdominal pain today.  No loose stools. Patient is not on any medications for her diabetes.    Clinical Impression  Patient limited for functional mobility as stated below secondary to BLE weakness, fatigue and poor standing balance. Patient requires mod assist for bed mobility to transition to seated EOB. She requires some support after initially sitting but is able to sit independently following. Patient unable to transfer to standing with RW from regular height bed with max assist secondary to LE weakness but is able to from elevated bed with mod/max assist and RW. Patient states dizziness upon initial standing which subsides. She is able to ambulate several small lateral steps at bedside as well as several small, labored steps forward and back to the bed before becoming fatigued. Patient repositioned in bed with pillows to offload wound. Patient will benefit from continued physical therapy in hospital and recommended venue below to increase strength, balance, endurance for safe ADLs and gait.     Follow Up Recommendations SNF    Equipment Recommendations  None recommended by PT    Recommendations for Other Services        Precautions / Restrictions Precautions Precautions: Fall Restrictions Weight Bearing Restrictions: No      Mobility  Bed Mobility Overal bed mobility: Needs Assistance Bed Mobility: Supine to Sit;Sit to Supine;Rolling Rolling: Min assist;Mod assist   Supine to sit: Mod assist;HOB elevated Sit to supine: Mod assist   General bed mobility comments: Patient requires mod assist to transition to seated EOB. she requires min assist to roll to L for positioning pillows at end of session but requires mod assist to roll to R  Transfers Overall transfer level: Needs assistance Equipment used: Rolling walker (2 wheeled) Transfers: Sit to/from Stand Sit to Stand: Mod assist;Max assist;From elevated surface         General transfer comment: Patient unable to transfer to standing with RW and max assist from bed at regular height, able to stand with mod/max assist from elevated bed with RW, dizzy upon initial standing  Ambulation/Gait Ambulation/Gait assistance: Mod assist Gait Distance (Feet): 6 Feet Assistive device: Rolling walker (2 wheeled) Gait Pattern/deviations: Shuffle Gait velocity: decreased   General Gait Details: Patient limited to several lateral side steps at bedside with RW and several forward/ backward steps with RW; limited by fatigue  Stairs            Wheelchair Mobility    Modified Rankin (Stroke Patients Only)       Balance Overall balance assessment: Needs assistance Sitting-balance support: Feet supported Sitting balance-Leahy Scale: Fair Sitting balance - Comments: fair/good seated EOB   Standing balance support: Bilateral upper extremity supported Standing balance-Leahy Scale: Poor Standing balance comment: standing with using  RW                             Pertinent Vitals/Pain Pain Assessment: No/denies pain    Home Living Family/patient expects to be discharged to:: Private residence Living Arrangements: Alone Available  Help at Discharge: Family;Available PRN/intermittently Type of Home: Apartment Home Access: Level entry     Home Layout: One level Home Equipment: Walker - 2 wheels;Cane - single point;Wheelchair - manual      Prior Function Level of Independence: Needs assistance   Gait / Transfers Assistance Needed: household ambulator with RW  ADL's / Homemaking Assistance Needed: aid assists, family lives down the street and can assist        Hand Dominance        Extremity/Trunk Assessment   Upper Extremity Assessment Upper Extremity Assessment: Generalized weakness    Lower Extremity Assessment Lower Extremity Assessment: Generalized weakness    Cervical / Trunk Assessment Cervical / Trunk Assessment: Normal  Communication   Communication: No difficulties  Cognition Arousal/Alertness: Awake/alert Behavior During Therapy: WFL for tasks assessed/performed Overall Cognitive Status: Within Functional Limits for tasks assessed                                        General Comments      Exercises     Assessment/Plan    PT Assessment Patient needs continued PT services  PT Problem List Decreased strength;Decreased activity tolerance;Decreased balance;Decreased skin integrity;Decreased mobility       PT Treatment Interventions DME instruction;Balance training;Gait training;Neuromuscular re-education;Stair training;Functional mobility training;Patient/family education;Therapeutic activities;Therapeutic exercise    PT Goals (Current goals can be found in the Care Plan section)  Acute Rehab PT Goals Patient Stated Goal: Go home PT Goal Formulation: With patient Time For Goal Achievement: 11/28/19 Potential to Achieve Goals: Fair    Frequency Min 3X/week   Barriers to discharge        Co-evaluation               AM-PAC PT "6 Clicks" Mobility  Outcome Measure Help needed turning from your back to your side while in a flat bed without using  bedrails?: A Little Help needed moving from lying on your back to sitting on the side of a flat bed without using bedrails?: A Little Help needed moving to and from a bed to a chair (including a wheelchair)?: A Lot Help needed standing up from a chair using your arms (e.g., wheelchair or bedside chair)?: A Lot Help needed to walk in hospital room?: A Lot Help needed climbing 3-5 steps with a railing? : Total 6 Click Score: 13    End of Session Equipment Utilized During Treatment: Gait belt Activity Tolerance: Patient tolerated treatment well;Patient limited by fatigue Patient left: in bed;with call bell/phone within reach;with bed alarm set Nurse Communication: Mobility status PT Visit Diagnosis: Unsteadiness on feet (R26.81);Other abnormalities of gait and mobility (R26.89);Muscle weakness (generalized) (M62.81)    Time: 3818-2993 PT Time Calculation (min) (ACUTE ONLY): 27 min   Charges:   PT Evaluation $PT Eval Moderate Complexity: 1 Mod PT Treatments $Therapeutic Activity: 8-22 mins        2:58 PM, 11/14/19 Mearl Latin PT, DPT Physical Therapist at [facility]

## 2019-11-14 NOTE — Progress Notes (Signed)
PROGRESS NOTE  [name] PFX:[id number] DOB: [birth year] DOA: 11/01/2019 PCP: Maryruth Hancock, MD  HPI/Recap of past 24 hours: 69 y.o.femalewith medical history significant forCOPD asthma with chronic respiratory failure on 3 L, paroxysmal atrial fibrillation,HOCM,liver cirrhosis, diabetes mellitus. Patient was sent to the ED via EMS from her endocrinologist'soffice with blood sugar of 32,she was given D50 and blood sugar increased to 47, but on arrival to the ED, her blood sugar was 20.Patient has hadprofound weight loss of greater than 150 pounds in the past 2 years (weight stable last 6 months), dysphagia to solids/pills, recent diagnosis of scleroderma.  Over the past 3 months she has been vomiting every day, at least 2-3 times a day, she has been vomiting almostevery thing shetries to eat,withresulted in weight loss. She reports chronic intermittent abdominal pain. But she has no abdominal pain this admission. No loose stools. Patient is not on any medications for her diabetes.  She has chronic lower extremity swelling that that fluctuates but is currently unchanged,no chest pain, no difficulty breathing.  She continued to having vomiting during this admission despite conservative measures.  IR did not feel her anatomy was conducive for PEG.  General surgery has been consulted to place open gastrostomy tube.  Palliative medicine was consulted to discuss [place].  11/14/19: Seen and examined at her bedside this morning.  Reports burning sensation in her mouth.  Magic mouth wash with lidocaine ordered.  Also reports being up all day yesterday.  She is alert oriented x3.   Assessment/Plan: Principal Problem:   Hypoglycemia Active Problems:   Type 2 diabetes mellitus without complication, without long-term current use of insulin (HCC)   Essential hypertension   Asthma, chronic   Chronic respiratory failure with hypoxia (HCC)   ILD (interstitial lung disease) (HCC)    Hypothyroidism   Hepatic cirrhosis (HCC)   Paroxysmal atrial fibrillation (HCC)   Loss of weight   Abnormal CT scan   Intractable vomiting   Adult failure to thrive   Preoperative cardiovascular examination   Goals of care, counseling/discussion   Hypoglycemia/FTT/Intractable Nausea/vomiting -Dr. Roderic Palau discussed with endocrineDr. Dorris Fetch. Felt that hypoglycemia is related to severe malnutrition and depleted glycogen stores. Recommendations were to keep the patient on dextrose infusion while in the hospital. Also, keep gloves on patient's hands to help keep them warm. This may improve accuracy of CBG. He recommended holding off on further work-up for insulinoma in current setting since results may be skewed. This will be further followed up as an outpatient. Overall blood sugars are better with dextrose infusion.Need to ensure that patient is getting adequate enteral nutrition prior to discontinuing dextrose infusion. -continue D5/0.9NS  Intractable nausea/vomiting -worsened last 3 months -related to severe reflux esophagitis and gastroparesis -05/19/19--EGD--severe reflux esophagitis -GES--confirms gastroparesis -reglan increased to 7.5 mg tid -GI following and assisting with further work-up -Dr. Laural Golden agrees with PEG/PEJ -IR consulted-->anatomy not conducive for PEC -pt continues to spit up and having difficulty tolerating clear liquids -consult general surgery--planning open G-tube7/7 -7/4--case discussed with Dr. Constance Haw -repeat CT abd/pelv--Diffuse small bowel distension more pronounced in the LEFT and central abdomen similar to the previous exam; diffuse anasarca -clinically not obstructed -7/3 CT chest--Signs of interstitial lung disease with bronchiectatic changes at lung bases and subpleural ground-glass and reticulation  Nonsustained asymptomatic V. Tach Reported 8 runs of V. tach, and sustained an asymptomatic Obtain twelve-lead EKG Continue to  monitor  Hypertrophic CM -appreciate cardiology consult-->From a purely cardiac standpoint there is no contraindication to her  surgery, defer considerations regarding her noncardiac comorbidities to primary team. No additional cardaic testing planned at this time.  Hypoglycemia secondary to glycogen stores depletion -11/01/19 A1c--5.0 -no on any hypoglycemics -hypoglycemic epidoses improved on IV dextrose infusion  Dilated SB loops -likely ileus, not clinically obstructed -6/29 CT abd/pelv--dilated fluid filled SB loops, many lie anterior to stomach. Bibasilar bronchiectasis -passing flatus; had small BM 11/13/2019 -discussed with Dr. Fredda Hammed CT abd/pelv--as above  Chronic respiratory failure with hypoxia -On 3 L nasal cannula at baseline -Maintain O2 saturation greater than 90% -Continue bronchodilators  Pancreatic lesion: -MRCP this admission markedly motion degraded. Will need CT abd with pancreatic protocol in May 2022 as she did not have a good quality study with MRI in setting of motion degradation.  Essential HTN -Blood pressure is at goal -continue coreg and HTN  Hypothyroidism -continue home dose synthroid -TSH 10.589 -Repeat TSH in 6 weeks when acute illness has resolved  Scleroderma -on chronic prednisone  Oral thrush Reports burning in her mouth Satellite lesions below her tongue Started Magic mouthwash with lidocaine  Hepatic steatosis/Anasarca -check urine protein/creatiine ratio--0.12 -continue furosemideIV-->increase to 40 bid -likely due to hypoalbuminemia  Goals of Care -patient is DNR  Hypokalemia -Serum potassium 3.4 -replete as indicated  Hypomagnesemia Serum magnesium 1.3 Repleted with 4 g IV magnesium once   Status is: Inpatient  Remains inpatient appropriate because:Inpatient level of care appropriate due to severity of illness. Unable to tolerate po. Still vomiting.SurgicalOpen gtube planned   Dispo:  The patient is from:Home Anticipated d/c is NG:EXBM Anticipated d/c date is: 11/17/2019. Patient currently is not medically stable to d/c.        Family Communication:updated son on phone 7/4  Consultants:GI, general surgery, cardiology; palliative  Code Status: DNR  DVT Prophylaxis: [place] Heparin 3 times daily.   Procedures: As Listed in Progress Note Above  Antibiotics: None      Objective: Vitals:   11/13/19 0742 11/13/19 1422 11/13/19 1445 11/14/19 0815  BP:  111/85    Pulse:  98    Resp:  17 20   Temp:  97.8 F (36.6 C)    TempSrc:  Oral    SpO2: 100% (!) 87% 98% 100%  Weight:      Height:        Intake/Output Summary (Last 24 hours) at 11/14/2019 0953 Last data filed at 11/13/2019 1730 Gross per 24 hour  Intake 720 ml  Output --  Net 720 ml   Filed Weights   11/01/19 1448 11/01/19 2118  Weight: 68.9 kg 69.6 kg    Exam:  . General: 69 y.o. year-old female well developed well nourished in no acute distress.  Alert and oriented x3. . Cardiovascular: Regular rate and rhythm with no rubs or gallops.  No thyromegaly or JVD noted.   Marland Kitchen Respiratory: Clear to auscultation with no wheezes or rales. Good inspiratory effort. . Abdomen: Soft nontender nondistended with normal bowel sounds x4 quadrants. . Musculoskeletal: No lower extremity edema.  Marland Kitchen Psychiatry: Mood is appropriate for condition and setting   Data Reviewed: CBC: Recent Labs  Lab 11/09/19 0459 11/10/19 0453 11/12/19 0513  WBC 6.1 6.7 6.7  HGB 9.3* 9.5* 8.3*  HCT 27.2* 27.5* 24.9*  MCV 86.3 84.9 85.9  PLT 183 244 841   Basic Metabolic Panel: Recent Labs  Lab 11/08/19 0444 11/09/19 0459 11/10/19 0453 11/12/19 0513 11/14/19 0508  NA 136 135 134* 137 139  K 4.0 4.0 3.4* 3.0* 3.4*  CL 102 102 102 105  107  CO2 27 24 23 24  21*  GLUCOSE 127* 129* 145* 122* 141*  BUN 16 18 19 16 15   CREATININE 1.14* 1.19* 1.14* 0.99 0.93   CALCIUM 7.6* 7.8* 7.8* 7.5* 8.0*  MG  --   --   --   --  1.3*   GFR: Estimated Creatinine Clearance: 56.8 mL/min (by C-G formula based on SCr of 0.93 mg/dL). Liver Function Tests: Recent Labs  Lab 11/08/19 0444 11/09/19 0459 11/10/19 0453 11/12/19 0513 11/14/19 0508  AST 12* 13* 11* 10* 11*  ALT 9 10 8 8 9   ALKPHOS 57 66 73 78 80  BILITOT 0.6 0.7 0.7 0.8 0.7  PROT 5.3* 5.5* 5.5* 5.4* 5.7*  ALBUMIN 2.0* 2.0* 2.0* 1.7* 1.8*   No results for input(s): LIPASE, AMYLASE in the last 168 hours. No results for input(s): AMMONIA in the last 168 hours. Coagulation Profile: No results for input(s): INR, PROTIME in the last 168 hours. Cardiac Enzymes: No results for input(s): CKTOTAL, CKMB, CKMBINDEX, TROPONINI in the last 168 hours. BNP (last 3 results) No results for input(s): PROBNP in the last 8760 hours. HbA1C: No results for input(s): HGBA1C in the last 72 hours. CBG: Recent Labs  Lab 11/13/19 1219 11/13/19 1617 11/13/19 2010 11/14/19 0021 11/14/19 0746  GLUCAP 86 128* 106* 112* 94   Lipid Profile: No results for input(s): CHOL, HDL, LDLCALC, TRIG, CHOLHDL, LDLDIRECT in the last 72 hours. Thyroid Function Tests: Recent Labs    11/13/19 0541  TSH 4.395  FREET4 1.26*   Anemia Panel: Recent Labs    11/12/19 0513  VITAMINB12 255  FOLATE 22.6  FERRITIN 219  IRON 49   Urine analysis:    Component Value Date/Time   COLORURINE YELLOW 08/17/2019 [place] 08/17/2019 1134   LABSPEC 1.006 08/17/2019 1134   PHURINE 7.0 08/17/2019 1134   GLUCOSEU NEGATIVE 08/17/2019 1134   GLUCOSEU NEGATIVE 11/30/2013 1057   HGBUR NEGATIVE 08/17/2019 1134   BILIRUBINUR NEGATIVE 12/09/2018 0923   KETONESUR NEGATIVE 08/17/2019 1134   PROTEINUR NEGATIVE 08/17/2019 1134   UROBILINOGEN 0.2 11/30/2013 1057   NITRITE NEGATIVE 08/17/2019 1134   LEUKOCYTESUR NEGATIVE 08/17/2019 1134   Sepsis Labs: @LABRCNTIP (procalcitonin:4,lacticidven:4)  ) Recent Results (from the  past 240 hour(s))  Culture, Urine     Status: Abnormal   Collection Time: 11/10/19  8:06 PM   Specimen: Urine, Random  Result Value Ref Range Status   Specimen Description   Final    URINE, RANDOM Performed at [facility], [address], [place], [place] [address]    Special Requests   Final    NONE Performed at [facility], [address], [place], [place] [address]    Culture MULTIPLE SPECIES PRESENT, SUGGEST RECOLLECTION (A)  Final   Report Status 11/12/2019 FINAL  Final      Studies: No results found.  Scheduled Meds: . aspirin EC  81 mg Oral Daily  . carvedilol  25 mg Oral BID  . feeding supplement  1 Container Oral TID BM  . furosemide  40 mg Intravenous BID  . heparin  5,000 Units Subcutaneous Q8H  . latanoprost  1 drop Both Eyes QHS  . levothyroxine  88 mcg Oral QAC breakfast  . magic mouthwash w/lidocaine  5 mL Oral TID  . metoCLOPramide (REGLAN) injection  7.5 mg Intravenous TID AC  . mometasone-formoterol  2 puff Inhalation BID  . pantoprazole (PROTONIX) IV  40 mg Intravenous Q12H  . predniSONE  5 mg Oral Daily  Continuous Infusions: . dextrose 5 % and 0.9 % NaCl with KCl 20 mEq/L 75 mL/hr at 11/13/19 0741  . magnesium sulfate bolus IVPB 4 g (11/14/19 0941)  . potassium chloride 10 mEq (11/14/19 0939)     LOS: 12 days     Kayleen Memos, MD [facility] Pager [phone]  If 7PM-7AM, please contact night-coverage [email or web address] Password [facility] 11/14/2019, 9:53 AM

## 2019-11-14 NOTE — Progress Notes (Signed)
Progress over the past week in my absence reviewed. Discussed with Dr. Laural Golden at length this morning.  Patient reports much less nausea over the past 24 hours. Discussed with nursing staff. She has been consuming her full liquid trays without complaint other than burning mouth. Seems to be tolerating increased dose of Reglan without side effects.  Patient tells me that she is not interested in any form of tube traversing her abdominal wall for nutritional support at this time  Vital signs in last 24 hours: Temp:  [97.8 F (36.6 C)] 97.8 F (36.6 C) (07/04 1422) Pulse Rate:  [98] 98 (07/04 1422) Resp:  [17-20] 20 (07/04 1445) BP: (111)/(85) 111/85 (07/04 1422) SpO2:  [87 %-100 %] 100 % (07/05 0815) Last BM Date: 11/14/19 General:   Alert, chronically ill appearing, pleasant and cooperative in NAD. No dyskinetic movements noted Abdomen: Nondistended. Soft and nontender. No succussion splash  extremities:  Without clubbing or edema.    Intake/Output from previous day: 07/04 0701 - 07/05 0700 In: 1170 [P.O.:1170] Out: -  Intake/Output this shift: No intake/output data recorded.  Lab Results: Recent Labs    11/12/19 0513  WBC 6.7  HGB 8.3*  HCT 24.9*  PLT 259   BMET Recent Labs    11/12/19 0513 11/14/19 0508  NA 137 139  K 3.0* 3.4*  CL 105 107  CO2 24 21*  GLUCOSE 122* 141*  BUN 16 15  CREATININE 0.99 0.93  CALCIUM 7.5* 8.0*   LFT Recent Labs    11/12/19 0513 11/12/19 0513 11/14/19 0508  PROT 5.4*   < > 5.7*  ALBUMIN 1.7*   < > 1.8*  AST 10*   < > 11*  ALT 8   < > 9  ALKPHOS 78   < > 80  BILITOT 0.8   < > 0.7  BILIDIR 0.3*  --   --   IBILI 0.5  --   --    < > = values in this interval not displayed.   PT/INR No results for input(s): LABPROT, INR in the last 72 hours. Hepatitis Panel No results for input(s): HEPBSAG, HCVAB, HEPAIGM, HEPBIGM in the last 72 hours. C-Diff No results for input(s): CDIFFTOX in the last 72 hours.  Studies/Results: CT  CHEST W CONTRAST  Result Date: 11/12/2019 CLINICAL DATA:  Dyspnea, unclear etiology, cough EXAM: CT CHEST, ABDOMEN, AND PELVIS WITH CONTRAST TECHNIQUE: Multidetector CT imaging of the chest, abdomen and pelvis was performed following the standard protocol during bolus administration of intravenous contrast. CONTRAST:  137mL OMNIPAQUE IOHEXOL 300 MG/ML  SOLN COMPARISON:  CT evaluation from April 29, 2018 and November 08, 2019 FINDINGS: CT CHEST FINDINGS Cardiovascular: Heart size is stable. Three-vessel coronary artery disease. No pericardial effusion. Aorta is of normal caliber with calcified and noncalcified plaque similar to the prior study. Central pulmonary arteries mildly dilated otherwise unremarkable on venous phase assessment. Mediastinum/Nodes: Thoracic inlet structures with substernal thyroid extension. Variable attenuation of the substernal thyroid with similar appearance. This measures approximately 2.5 cm greatest axial dimension by 1.6 cm. Esophagus is markedly patulous and fluid-filled which is similar to previous exam. Filled with ingested contrast material. No adenopathy in the mediastinum. No axillary lymphadenopathy. Lungs/Pleura: Signs of interstitial lung disease similar to previous imaging with bronchiectatic changes at the lung bases and subpleural ground-glass and reticulation. Pulmonary emphysema. Small moderate bilateral effusions. Airways are patent. Musculoskeletal: Osteopenia. Spinal degenerative changes. No acute musculoskeletal process. See below for full musculoskeletal detail. CT ABDOMEN PELVIS FINDINGS Hepatobiliary: Liver  with mottled appearance on early venous phase. Portal vein is widely patent. No focal lesion in the liver. Gallbladder without pericholecystic stranding with small gallstones in the lumen. No biliary duct dilation. Pancreas: Small cystic lesions in the pancreas, largest approximately 7 mm. No main duct dilation beyond 3 mm 2 total lesions in the head of the  pancreas. No peripancreatic stranding. Spleen: Spleen normal size without focal lesion. Adrenals/Urinary Tract: Adrenal glands are normal. Marked renal cortical scarring and heterogeneity related to scarring. Small cyst in the interpolar RIGHT kidney. Urinary bladder is markedly distended extending into the low abdomen. 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 Stomach/Bowel: Gastric under distension. Fluid and contrast in the distal esophagus. 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 Colonic diverticulosis. Appendix is normal. Vascular/Lymphatic: No aneurysmal dilation of the abdominal aorta. Scattered atheromatous plaque. No adenopathy. Reproductive: Uterus with mild heterogeneity, nonspecific finding perhaps related to underlying leiomyomata based on calcifications and pattern of enhancement. No adnexal mass. Other: Diffuse anasarca with body wall edema grossly similar to recent imaging no abdominal wall hernia. Musculoskeletal: Spinal degenerative changes without acute or destructive bone process. IMPRESSION: 1. Signs of interstitial lung disease with bronchiectatic changes at the lung bases and subpleural ground-glass and reticulation. 2. Chronic dilation of the esophagus. 3. Bilateral effusions very slightly increased from recent imaging. 4. Markedly distended urinary bladder extending into the low abdomen. Correlate with any signs of bladder outlet obstruction. 5. Diffuse small bowel distension more pronounced in the LEFT and central abdomen similar to the previous exam may represent partial small bowel obstruction or related to ileus and/or partial small-bowel obstruction superimposed on chronic dilation in the setting of scleroderma perhaps slightly less dilated in the distal small bowel when compared to previous study from November 08, 2019. 6. Heterogeneity of hepatic enhancement in the early  phase suggests congestive hepatopathy given that it largely normalizes on later phase. Correlation with hepatic enzymes may be helpful. 7. Small cystic lesions in the pancreas, largest approximately 7 mm. No main duct dilation beyond 3 mm 2 mm total lesions in the head of the pancreas. 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 Follow-up CT or MRI in 1 year is suggested. 8. Diffuse anasarca similar to the prior study. 9. Cholelithiasis. 10. Nodular enlargement of substernal thyroid. Recommend thyroid ultrasound correlation with previously performed thyroid ultrasound(ref: J Am Coll Radiol. 2015 Feb;12(2): 143-50). 11. 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 12. Three-vessel coronary artery disease. 13. Emphysema and aortic atherosclerosis. Aortic Atherosclerosis (ICD10-I70.0) and Emphysema (ICD10-J43.9). Electronically Signed   By: Zetta Bills M.D.   On: 11/12/2019 18:24   CT ABDOMEN PELVIS W CONTRAST  Result Date: 11/12/2019 CLINICAL DATA:  Dyspnea, unclear etiology, cough EXAM: CT CHEST, ABDOMEN, AND PELVIS WITH CONTRAST TECHNIQUE: Multidetector CT imaging of the chest, abdomen and pelvis was performed following the standard protocol during bolus administration of intravenous contrast. CONTRAST:  162mL OMNIPAQUE IOHEXOL 300 MG/ML  SOLN COMPARISON:  CT evaluation from April 29, 2018 and November 08, 2019 FINDINGS: CT CHEST FINDINGS Cardiovascular: Heart size is stable. Three-vessel coronary artery disease. No pericardial effusion. Aorta is of normal caliber with calcified and noncalcified plaque similar to the prior study. Central pulmonary arteries mildly dilated otherwise unremarkable on venous phase assessment. Mediastinum/Nodes: Thoracic inlet structures with substernal thyroid extension. Variable attenuation of the substernal thyroid with similar appearance. This  measures approximately 2.5 cm greatest axial  dimension by 1.6 cm. Esophagus is markedly patulous and fluid-filled which is similar to previous exam. Filled with ingested contrast material. No adenopathy in the mediastinum. No axillary lymphadenopathy. Lungs/Pleura: Signs of interstitial lung disease similar to previous imaging with bronchiectatic changes at the lung bases and subpleural ground-glass and reticulation. Pulmonary emphysema. Small moderate bilateral effusions. Airways are patent. Musculoskeletal: Osteopenia. Spinal degenerative changes. No acute musculoskeletal process. See below for full musculoskeletal detail. CT ABDOMEN PELVIS FINDINGS Hepatobiliary: Liver with mottled appearance on early venous phase. Portal vein is widely patent. No focal lesion in the liver. Gallbladder without pericholecystic stranding with small gallstones in the lumen. No biliary duct dilation. Pancreas: Small cystic lesions in the pancreas, largest approximately 7 mm. No main duct dilation beyond 3 mm 2 total lesions in the head of the pancreas. No peripancreatic stranding. Spleen: Spleen normal size without focal lesion. Adrenals/Urinary Tract: Adrenal glands are normal. Marked renal cortical scarring and heterogeneity related to scarring. Small cyst in the interpolar RIGHT kidney. Urinary bladder is markedly distended extending into the low abdomen. 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 Stomach/Bowel: Gastric under distension. Fluid and contrast in the distal esophagus. 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 Colonic diverticulosis. Appendix is normal. Vascular/Lymphatic: No aneurysmal dilation of the abdominal aorta. Scattered atheromatous plaque. No adenopathy. Reproductive: Uterus with mild heterogeneity, nonspecific finding perhaps related to underlying leiomyomata based on calcifications and pattern of enhancement. No adnexal mass. Other:  Diffuse anasarca with body wall edema grossly similar to recent imaging no abdominal wall hernia. Musculoskeletal: Spinal degenerative changes without acute or destructive bone process. IMPRESSION: 1. Signs of interstitial lung disease with bronchiectatic changes at the lung bases and subpleural ground-glass and reticulation. 2. Chronic dilation of the esophagus. 3. Bilateral effusions very slightly increased from recent imaging. 4. Markedly distended urinary bladder extending into the low abdomen. Correlate with any signs of bladder outlet obstruction. 5. Diffuse small bowel distension more pronounced in the LEFT and central abdomen similar to the previous exam may represent partial small bowel obstruction or related to ileus and/or partial small-bowel obstruction superimposed on chronic dilation in the setting of scleroderma perhaps slightly less dilated in the distal small bowel when compared to previous study from November 08, 2019. 6. Heterogeneity of hepatic enhancement in the early phase suggests congestive hepatopathy given that it largely normalizes on later phase. Correlation with hepatic enzymes may be helpful. 7. Small cystic lesions in the pancreas, largest approximately 7 mm. No main duct dilation beyond 3 mm 2 mm total lesions in the head of the pancreas. 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 Follow-up CT or MRI in 1 year is suggested. 8. Diffuse anasarca similar to the prior study. 9. Cholelithiasis. 10. Nodular enlargement of substernal thyroid. Recommend thyroid ultrasound correlation with previously performed thyroid ultrasound(ref: J Am Coll Radiol. 2015 Feb;12(2): 143-50). 11. 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 12. Three-vessel coronary artery disease. 13. Emphysema and aortic atherosclerosis. Aortic Atherosclerosis (ICD10-I70.0) and Emphysema (ICD10-J43.9). Electronically Signed   By: Zetta Bills M.D.   On: 11/12/2019 18:24    Assessment: Principal Problem:   Hypoglycemia Active Problems:   Type 2 diabetes mellitus without complication, without long-term current use of insulin (HCC)   Essential hypertension   Asthma, chronic   Chronic respiratory failure with hypoxia (  HCC)   ILD (interstitial lung disease) (HCC)   Hypothyroidism   Hepatic cirrhosis (HCC)   Paroxysmal atrial fibrillation (HCC)   Loss of weight   Abnormal CT scan   Intractable vomiting   Adult failure to thrive   Preoperative cardiovascular examination   Goals of care, counseling/discussion   Impression: Extremely complicated scenario involving this 69 year old lady with multiple comorbidities including gastroparesis, scleroderma and marked protein calorie malnutrition. I agree with Dr. Laural Golden that we have thoughtfully come to offering a surgically placed J-tube as a "rescue maneuver" to gain ground which has been lost with her dysfunctional upper GI tract.  She is telling me today that she is not interested in having a J-tube placed.  Seemingly, by patient report and that of nursing staff, she has done better over the past 24 hours with escalating the dose of Reglan without apparent side effects.  "Burning mouth" on Magic mouthwash.  Recommendations: Increase Reglan to 10 mg IV AC (3 times daily). Watch for side effects. Continue a full liquid diet for now with protein supplements.  Reassess her candidacy/interest for a J-tube tomorrow.   As clinically feasible moving forward, we could consider pursuing a gastric pacer for compassionate use.

## 2019-11-14 NOTE — Progress Notes (Signed)
[facility]  Feeling about the same. Says she does not want a feeding tube now. Dr. Carles Collet had long conversation with patient and Dannielle Huh and Peggy yesterday.   Tried to call Kelvin but left message.   Will continue to ensure family and patient's questions answered but agree. Grave prognosis, concern for not extubating patient after surgery and need for trach given lungs and high risk of complications.   Palliative will hopefully see tomorrow.  BP 111/85 (BP Location: Left Arm)   Pulse 98   Temp 97.8 F (36.6 C) (Oral)   Resp 20   Ht 5\' 8"  (1.727 m)   Wt 69.6 kg   SpO2 100%   BMI 23.33 kg/m  NAD Normal work breathing Soft, nontender  Curlene Labrum, MD [facility] [address] [place], [place] [address] [phone] (office)

## 2019-11-15 ENCOUNTER — Encounter (HOSPITAL_COMMUNITY): Payer: Self-pay | Admitting: Anesthesiology

## 2019-11-15 DIAGNOSIS — Z515 Encounter for palliative care: Secondary | ICD-10-CM

## 2019-11-15 DIAGNOSIS — Z7189 Other specified counseling: Secondary | ICD-10-CM

## 2019-11-15 LAB — BASIC METABOLIC PANEL
Anion gap: 9 (ref 5–15)
BUN: 15 mg/dL (ref 8–23)
CO2: 23 mmol/L (ref 22–32)
Calcium: 7.6 mg/dL — ABNORMAL LOW (ref 8.9–10.3)
Chloride: 106 mmol/L (ref 98–111)
Creatinine, Ser: 0.82 mg/dL (ref 0.44–1.00)
GFR calc Af Amer: >60 mL/min (ref >60)
GFR calc non Af Amer: >60 mL/min (ref >60)
Glucose, Bld: 81 mg/dL (ref 70–99)
Potassium: 3.3 mmol/L — ABNORMAL LOW (ref 3.5–5.1)
Sodium: 138 mmol/L (ref 135–145)

## 2019-11-15 LAB — GLUCOSE, CAPILLARY
Glucose-Capillary: 110 mg/dL — ABNORMAL HIGH (ref 70–99)
Glucose-Capillary: 14 mg/dL — CL (ref 70–99)
Glucose-Capillary: 154 mg/dL — ABNORMAL HIGH (ref 70–99)
Glucose-Capillary: 159 mg/dL — ABNORMAL HIGH (ref 70–99)
Glucose-Capillary: 31 mg/dL — CL (ref 70–99)
Glucose-Capillary: 41 mg/dL — CL (ref 70–99)
Glucose-Capillary: 48 mg/dL — ABNORMAL LOW (ref 70–99)
Glucose-Capillary: 56 mg/dL — ABNORMAL LOW (ref 70–99)
Glucose-Capillary: 56 mg/dL — ABNORMAL LOW (ref 70–99)
Glucose-Capillary: 58 mg/dL — ABNORMAL LOW (ref 70–99)
Glucose-Capillary: 65 mg/dL — ABNORMAL LOW (ref 70–99)
Glucose-Capillary: 66 mg/dL — ABNORMAL LOW (ref 70–99)
Glucose-Capillary: 79 mg/dL (ref 70–99)
Glucose-Capillary: 80 mg/dL (ref 70–99)

## 2019-11-15 LAB — MAGNESIUM: Magnesium: 1.9 mg/dL (ref 1.7–2.4)

## 2019-11-15 LAB — CBC
HCT: 27.7 % — ABNORMAL LOW (ref 36.0–46.0)
Hemoglobin: 9.2 g/dL — ABNORMAL LOW (ref 12.0–15.0)
MCH: 29 pg (ref 26.0–34.0)
MCHC: 33.2 g/dL (ref 30.0–36.0)
MCV: 87.4 fL (ref 80.0–100.0)
Platelets: 279 10*3/uL (ref 150–400)
RBC: 3.17 MIL/uL — ABNORMAL LOW (ref 3.87–5.11)
RDW: 15.2 % (ref 11.5–15.5)
WBC: 8.9 10*3/uL (ref 4.0–10.5)
nRBC: 0 % (ref 0.0–0.2)

## 2019-11-15 LAB — PHOSPHORUS: Phosphorus: 2.6 mg/dL (ref 2.5–4.6)

## 2019-11-15 LAB — T3, FREE: T3, Free: 0.7 pg/mL — ABNORMAL LOW (ref 2.0–4.4)

## 2019-11-15 MED ORDER — ADULT MULTIVITAMIN W/MINERALS CH
1.0000 | ORAL_TABLET | Freq: Every day | ORAL | Status: DC
  Administered 2019-11-15 – 2019-11-18 (×4): 1 via ORAL
  Filled 2019-11-15 (×4): qty 1

## 2019-11-15 MED ORDER — POTASSIUM CHLORIDE 10 MEQ/100ML IV SOLN
10.0000 meq | INTRAVENOUS | Status: AC
  Administered 2019-11-15 (×2): 10 meq via INTRAVENOUS

## 2019-11-15 MED ORDER — POTASSIUM CHLORIDE CRYS ER 20 MEQ PO TBCR
40.0000 meq | EXTENDED_RELEASE_TABLET | Freq: Every day | ORAL | Status: DC
  Filled 2019-11-15: qty 2

## 2019-11-15 MED ORDER — FOLIC ACID 1 MG PO TABS
1.0000 mg | ORAL_TABLET | Freq: Every day | ORAL | Status: DC
  Administered 2019-11-15 – 2019-11-18 (×4): 1 mg via ORAL
  Filled 2019-11-15 (×4): qty 1

## 2019-11-15 MED ORDER — POTASSIUM CHLORIDE CRYS ER 20 MEQ PO TBCR: 40.0000 meq | EXTENDED_RELEASE_TABLET | Freq: Every day | ORAL | Status: DC

## 2019-11-15 MED ORDER — GLUCOSE 40 % PO GEL
ORAL | Status: AC
  Filled 2019-11-15: qty 1

## 2019-11-15 MED ORDER — POTASSIUM CHLORIDE 10 MEQ/100ML IV SOLN
10.0000 meq | INTRAVENOUS | Status: DC
  Administered 2019-11-15 (×2): 10 meq via INTRAVENOUS
  Filled 2019-11-15 (×5): qty 100

## 2019-11-15 MED ORDER — VITAMIN B-12 100 MCG PO TABS
500.0000 ug | ORAL_TABLET | Freq: Every day | ORAL | Status: DC
  Administered 2019-11-15 – 2019-11-18 (×4): 500 ug via ORAL
  Filled 2019-11-15 (×4): qty 5

## 2019-11-15 NOTE — Progress Notes (Addendum)
Subjective: Patient was sleeping when I enter the room.  She arouses easily but kept eyes closed during encounter. No abdominal pain. Feels N/V is improving. Reports bringing up mostly phlegm when she does vomit.  Cannot tell me how many times she vomited yesterday with certainty but thinks maybe 3 times.  Reports having half a cup of Jell-O, coffee, and Ensure this morning.  She tolerated this well.  She had pills crushed and ice cream this morning and reports a small amount of vomiting thereafter which she felt was related to the bitterness/grittiness of the crushed pills.  No nausea at this time.  Reports having watery BMs which was present prior to admission. She has 3 documented stools yesterday.  No BRBPR or melena.  Patient again states she does not want a feeding tube at this time.  Briefly mentioned gastric pacer but patient was not interested in discussing this.  Objective: Vital signs in last 24 hours: Temp:  [98.3 F (36.8 C)-98.7 F (37.1 C)] 98.3 F (36.8 C) (07/06 0500) Pulse Rate:  [84-94] 94 (07/06 0500) Resp:  [18-22] 22 (07/06 0500) BP: (107-117)/(60-76) 117/60 (07/06 0500) SpO2:  [94 %-97 %] 94 % (07/06 0812) Last BM Date: 11/14/19 General:   No acute distress.  Sleeping upon entering the room.  Arouses easily but keeps eyes closed during encounter. Head:  Normocephalic and atraumatic. Abdomen:  Bowel sounds present, soft, non-tender, non-distended. No HSM or hernias noted. No rebound or guarding. No masses appreciated  Extremities:  Without edema. Neurologic:  Alert and  oriented x4;  grossly normal neurologically. No tremors of hands or tongue.  Skin:  Warm and dry, intact without significant lesions.  Psych:  Normal mood and affect.  Intake/Output from previous day: 07/05 0701 - 07/06 0700 In: 1460 [P.O.:360; I.V.:600; IV Piggyback:500] Out: 2400 [Urine:2400] Intake/Output this shift: Total I/O In: 120 [P.O.:120] Out: -   Lab Results: Recent Labs     11/15/19 0615  WBC 8.9  HGB 9.2*  HCT 27.7*  PLT 279   BMET Recent Labs    11/14/19 0508 11/15/19 0615  NA 139 138  K 3.4* 3.3*  CL 107 106  CO2 21* 23  GLUCOSE 141* 81  BUN 15 15  CREATININE 0.93 0.82  CALCIUM 8.0* 7.6*   LFT Recent Labs    11/14/19 0508  PROT 5.7*  ALBUMIN 1.8*  AST 11*  ALT 9  ALKPHOS 80  BILITOT 0.7    Assessment: Extremely complicated scenario involving this 69 year old female admitted with hypoglycemia with history of chronic nausea/vomiting, abdominal pain, profound weight loss of greater than 150 pounds in the past 2 years, marked protein calorie malnutrition, dysphagia to solids/pills, recent diagnosis of scleroderma.  Nausea/Vomiting:GES only completed to 2 hours. Consistent with gastroparesis. CT with chronically dilated esophagus and small bowel dilation although clinically without obstruction, possible developing ileus. Bowels continue to move and she is without abdominal pain. Recent EGD January 2021 with severe ulcerative reflux esophagitis extending halfway up esophagus. This admission Reglan increased from 5 mg to 7.5 mg now to 10 mg TID before meals (received 2 doses thus far). Feels N/V is somewhat improved but continues to vomit after meals.  Reports mostly bringing up phlegm.  This morning, she felt vomiting was triggered by consuming crushed medications.  Had recommended PEG to PEJ but due to complex anatomy this is not possible.  General surgery has seen patient and discussed possible open feeding tube but patient is now declining feeding tube.  Tried discussing possibility of gastric pacer but patient was not interested in discussing.  Unfortunately, even though patient feels N/V is improving, she continues with hypoglycemic episodes even on IV fluids with dextrose.  Palliative has been consulted.  Their note is not complete but it appears that patient has agreed to home hospice support. She was resistant to discharging to a facility.    Pancreatic lesion:MRCP this admission markedly motion degraded. Will need CT abd with pancreatic protocol in May 2022 as she did not have a good quality study with MRI in setting of motion degradation.  Documentation in chart with concern for cirrhosis: Known fatty liver. Spleen normal. No stigmata of portal hypertension on recent EGD. No thrombocytopenia. Likely dealing moreso with hepatic steatosis and does not appear to have advanced liver disease.   Plan: Continue full liquid diet.  Continue Reglan 10 mg IV 3 times daily. Continue IV PPI BID.  Needs to be upright for 3 hours after meals. Appreciate palliative consult.   Will need to discuss the complicated scenario further with Dr. Gala Romney. Not sure there is much else to offer as patient is resistant to intervention to help with feeding or gastric emptying. Appears she may be transitioning to hospice care.    LOS: 13 days    11/15/2019, 10:49 AM   Aliene Altes, [facility]  Attending note: Agree with above assessment and recommendations.  So long as she is tolerating it, would continue Reglan 10 mg before meals at least for the short-term.  Would transition to p.o.  In fact, could give Reglan subcutaneously as an outpatient as needed.

## 2019-11-15 NOTE — Progress Notes (Signed)
[facility]  Patient opting for palliation/ hospice at home. No plans for surgery. Spoke with Palliative provider and I agree.  Appreciate assistance.  Curlene Labrum, MD [facility] [address] [place], [place] [address] [phone] (office)

## 2019-11-15 NOTE — Progress Notes (Signed)
Has been able to hold down small amount of intake today.  Ate all of pudding for lunch and supper as well as coffee and some jello.

## 2019-11-15 NOTE — Consult Note (Signed)
Consultation Note Date: 11/15/2019   Patient Name: [name]  DOB: 1950/09/13  MRN: [id number]  Age / Sex: 69 y.o., female  PCP: Maryruth Hancock, MD Referring Physician: Kayleen Memos, DO  Reason for Consultation: Establishing goals of care  HPI/Patient Profile: 69 y.o. female  with past medical history of COPD on 3L, OSA, diastolic heart failure, hypertrophic obstructive cardiomyopathy, HTN, HLD, paroxysmal atrial fibrillation, scleroderma, liver cirrhosis, diabetes admitted on 11/01/2019 from endocrinologist office with hypoglycemia. Chronic vomiting over past ~3 months with weight loss and occasional abdominal pain. Ongoing hypoglycemia related to severe malnutrition and depleted glycogen stores per Dr. Dorris Fetch endocrinologist. Also with severe reflux and gastroparesis now on Reglan. Also concern for bronchitis.   Clinical Assessment and Goals of Care: I met today with [name] and no family at bedside. [name] appears weak, fatigued, and cachectic. She tells me she feels "bad" and continues to have vomiting. Also still having hypoglycemic episodes.  I discussed with [name] concern for her overall prognosis. She confirms right away that she does not desire feeding tube placement. She expresses desire to return home but does not have 24/7 care. We discussed expectations of recurrent hypoglycemia even here in the hospital with aggressive care and dextrose fluids. I anticipate that this will make her time very limited and expressed this to [name]. We discussed hospice facility and she became very tearful and tells me that her son was there and she does not want to go there. She does agree to hospice support at home. After our discussion she states that she can see if her cousin, [name], can stay with her at home. She also has a sister who lives in same apartment complex but has her own health issues.  I also ask her who she would want to make medical decisions for her if she is unable and she tells me that she would want her daughter-in-law [name] to make decisions. She also gives me permission to call and speak with Peggy.   I have made multiple attempts to contact son and daughter-in-law. I received a voicemail from son returning my call but he did not answer my return phone calls either. I will continue attempts tomorrow. I want to ensure that family will be able to support Ms. Haun at home and that they know what the expectations are as her prognosis is extremely poor.   All questions/concerns addressed. Emotional support provided.   Primary Decision Maker PATIENT with support from family    SUMMARY OF RECOMMENDATIONS   Ongoing [place] discussion but decision has been made NOT to pursue feeding tube placement  Code Status/Advance Care Planning:  DNR   Symptom Management:   Per attending  Palliative Prophylaxis:   Bowel Regimen, Delirium Protocol, Frequent Pain Assessment, Oral Care and Turn Reposition   Psycho-social/Spiritual:   Desire for further Chaplaincy support:yes  Additional Recommendations: Education on Hospice and Grief/Bereavement Support  Prognosis:   < 2 weeks anticipated with frequent episodes of hypoglycemia  Discharge Planning:  To Be Determined      Primary Diagnoses: Present on Admission: . Hypoglycemia . Essential hypertension . Hypothyroidism . Asthma, chronic . ILD (interstitial lung disease) ([place]) . Chronic respiratory failure with hypoxia ([place]) . Paroxysmal atrial fibrillation (HCC) . Hepatic cirrhosis ([place]) . Loss of weight   I have reviewed the medical record, interviewed the patient and family, and examined the patient. The following aspects are pertinent.  Past Medical History:  Diagnosis Date  . Anterolisthesis    Grade 1, L4-5  . Bronchospasm 05/28/2012  . COPD (chronic obstructive pulmonary disease) ([place])   . DEGENERATIVE  JOINT DISEASE 10/06/2006  . DEPRESSION 09/26/2008  . DIABETES MELLITUS 10/06/2006  . Diastolic heart failure ([place])    Echo 10/2018 LVEF 55-60% with impaired relaxation.  . Diverticulosis    [id number]  . GERD (gastroesophageal reflux disease) 07/25/2013  . Glaucoma   . HOCM (hypertrophic obstructive cardiomyopathy) ([place])   . HYPERLIPIDEMIA 01/11/2009  . HYPERTENSION 10/06/2006  . Hypokalemia   . Hypomagnesemia   . Hypothyroidism   . INSOMNIA 09/26/2008  . Internal hemorrhoids   . OBSTRUCTIVE SLEEP APNEA 06/23/2008   Severe OSA per sleep study 2010, Rx a CPAP  . Pain in joint, multiple sites 11/10/2006  . Pericardial effusion   . Pericarditis   . UNSPECIFIED ANEMIA 12/10/2009  . UTI (urinary tract infection) 11/2017   Social History   Socioeconomic History  . Marital status: Widowed    Spouse name: Not on file  . Number of children: 4  . Years of education: Not on file  . Highest education level: Not on file  Occupational History  . Occupation: disability    Employer: UNEMPLOYED  Tobacco Use  . Smoking status: Former Smoker    Packs/day: 0.20    Years: 4.00    Pack years: 0.80    Types: Cigarettes    Quit date: 05/13/1995    Years since quitting: 24.5  . Smokeless tobacco: Never Used  Vaping Use  . Vaping Use: Never used  Substance and Sexual Activity  . Alcohol use: Not Currently  . Drug use: No  . Sexual activity: Not Currently  Other Topics Concern  . Not on file  Social History Narrative   Widow , lives by herself   Lost a son, 3 living    No children in [place], [place] lives in [place], he visits and helps w/ shopping    Lost husband   Social Determinants of Health   Financial Resource Strain:   . Difficulty of Paying Living Expenses:   Food Insecurity:   . Worried About Charity fundraiser in the Last Year:   . Arboriculturist in the Last Year:   Transportation Needs:   . Film/video editor (Medical):   Marland Kitchen Lack of Transportation (Non-Medical):   Physical  Activity:   . Days of Exercise per Week:   . Minutes of Exercise per Session:   Stress:   . Feeling of Stress :   Social Connections:   . Frequency of Communication with Friends and Family:   . Frequency of Social Gatherings with Friends and Family:   . Attends Religious Services:   . Active Member of Clubs or Organizations:   . Attends Archivist Meetings:   Marland Kitchen Marital Status:    Family History  Problem Relation Age of Onset  . Asthma Mother   . Stroke Mother   . Diabetes Mother   . Diabetes Other  M, B, S  . Hypertension Sister        M, S,B  . Pancreatic cancer Brother   . Colon cancer Neg Hx   . Prostate cancer Neg Hx   . Breast cancer Neg Hx   . Esophageal cancer Neg Hx   . Liver disease Neg Hx   . Rectal cancer Neg Hx   . Stomach cancer Neg Hx   . Inflammatory bowel disease Neg Hx    Scheduled Meds: . aspirin EC  81 mg Oral Daily  . carvedilol  25 mg Oral BID  . dextrose      . feeding supplement  1 Container Oral TID BM  . folic acid  1 mg Oral Daily  . furosemide  40 mg Intravenous BID  . heparin  5,000 Units Subcutaneous Q8H  . latanoprost  1 drop Both Eyes QHS  . levothyroxine  88 mcg Oral QAC breakfast  . magic mouthwash w/lidocaine  5 mL Oral TID  . metoCLOPramide (REGLAN) injection  10 mg Intravenous TID AC  . mometasone-formoterol  2 puff Inhalation BID  . multivitamin with minerals  1 tablet Oral Daily  . pantoprazole (PROTONIX) IV  40 mg Intravenous Q12H  . predniSONE  5 mg Oral Daily  . vitamin B-12  500 mcg Oral Daily   Continuous Infusions: . dextrose 5 % and 0.9 % NaCl with KCl 20 mEq/L 75 mL/hr at 11/14/19 2141  . potassium chloride     PRN Meds:.acetaminophen **OR** acetaminophen, ipratropium-albuterol, ondansetron **OR** ondansetron (ZOFRAN) IV, polyethylene glycol No Known Allergies Review of Systems  Constitutional: Positive for activity change, appetite change, fatigue and unexpected weight change.  Respiratory:  Negative for shortness of breath.   Gastrointestinal: Positive for vomiting. Negative for abdominal pain.  Neurological: Positive for weakness.    Physical Exam Vitals and nursing note reviewed.  Constitutional:      General: She is not in acute distress.    Appearance: She is cachectic. She is ill-appearing.  Cardiovascular:     Rate and Rhythm: Normal rate.  Pulmonary:     Effort: Pulmonary effort is normal. No tachypnea, accessory muscle usage or retractions.  Abdominal:     Palpations: Abdomen is soft.  Neurological:     Mental Status: She is alert and oriented to person, place, and time.  Psychiatric:        Mood and Affect: Mood is anxious.     Vital Signs: BP 117/60   Pulse 94   Temp 98.3 F (36.8 C) (Oral)   Resp (!) 22   Ht '5\' 8"'$  (1.727 m)   Wt 69.6 kg   SpO2 94%   BMI 23.33 kg/m  Pain Scale: 0-10   Pain Score: 0-No pain   SpO2: SpO2: 94 % O2 Device:SpO2: 94 % O2 Flow Rate: .O2 Flow Rate (L/min): 2 L/min  IO: Intake/output summary:   Intake/Output Summary (Last 24 hours) at 11/15/2019 0905 Last data filed at 11/15/2019 0600 Gross per 24 hour  Intake 1340 ml  Output 1600 ml  Net -260 ml    LBM: Last BM Date: 11/14/19 Baseline Weight: Weight: 68.9 kg Most recent weight: Weight: 69.6 kg     Palliative Assessment/Data:     Time In: 0915 Time Out: 1005 Time Total: 50 min Greater than 50%  of this time was spent counseling and coordinating care related to the above assessment and plan.  Signed by: Vinie Sill, NP Palliative Medicine Team Pager # [phone] (M-F 8a-5p) Team Phone #  [phone] (Nights/Weekends)

## 2019-11-15 NOTE — TOC Progression Note (Signed)
Transition of Care [facility]) - Progression Note    Patient Details  Name: SACHIKO METHOT MRN: [id number] Date of Birth: 1951/02/06  Transition of [facility]) CM/SW Contact  Salome Arnt, [place] Phone Number: 11/15/2019, 3:37 PM  Clinical Narrative:  LCSW discussed SNF recommendation with pt. She states she would prefer to go home, but is not sure this is feasible. Pt said she has an aid every day of the week. Pt agreed to bed search in [facility]- prefers [place]. [place] pending. Palliative note pending. TOC will continue to follow and start SNF authorization if appropriate.      Expected Discharge Plan: [place] Barriers to Discharge: Continued Medical Work up  Expected Discharge Plan and Services Expected Discharge Plan: [place] arrangements for the past 2 months: Apartment                                       Social Determinants of Health (SDOH) Interventions    Readmission Risk Interventions No flowsheet data found.

## 2019-11-15 NOTE — Progress Notes (Signed)
Patient had a hypoglycemic episode this am.  Hypoglycemic orders initiated.  Patient given juice and half of glucose gel. CBG increased.  Notified daytime RN. Patient alert and responded appropriately all am. Will continue to monitor patient.

## 2019-11-15 NOTE — Progress Notes (Signed)
PROGRESS NOTE  [name] FVC:[id number] DOB: 05/23/1950 DOA: 11/01/2019 PCP: Maryruth Hancock, MD  HPI/Recap of past 24 hours: 69 y.o.femalewith medical history significant forCOPD asthma with chronic respiratory failure on 3 L, paroxysmal atrial fibrillation,HOCM,liver cirrhosis, diabetes mellitus. Patient was sent to the ED via EMS from her endocrinologist'soffice with blood sugar of 32,she was given D50 and blood sugar increased to 47, but on arrival to the ED, her blood sugar was 20.Patient has hadprofound weight loss of greater than 150 pounds in the past 2 years (weight stable last 6 months), dysphagia to solids/pills, recent diagnosis of scleroderma.  Over the past 3 months she has been vomiting every day, at least 2-3 times a day, she has been vomiting almostevery thing shetries to eat,withresulted in weight loss. She reports chronic intermittent abdominal pain. But she has no abdominal pain this admission. No loose stools. Patient is not on any medications for her diabetes.  She has chronic lower extremity swelling that that fluctuates but is currently unchanged,no chest pain, no difficulty breathing.  She continued to having vomiting during this admission despite conservative measures.  IR did not feel her anatomy was conducive for PEG.  General surgery has been consulted to place open gastrostomy tube.  Palliative medicine was consulted to discuss [place].  11/15/19: Seen and examined at her bedside this morning.  She is alert and oriented x3.  She is feeding herself.  She states her nausea is improved.  She declines PEG tube placement.  PT assessment recommended SNF.  TOC assisting with placement.  Assessment/Plan: Principal Problem:   Hypoglycemia Active Problems:   Type 2 diabetes mellitus without complication, without long-term current use of insulin (HCC)   Essential hypertension   Asthma, chronic   Chronic respiratory failure with hypoxia (HCC)   ILD  (interstitial lung disease) (HCC)   Hypothyroidism   Hepatic cirrhosis (HCC)   Paroxysmal atrial fibrillation (HCC)   Loss of weight   Abnormal CT scan   Intractable vomiting   Adult failure to thrive   Preoperative cardiovascular examination   Goals of care, counseling/discussion   Hypoglycemia/FTT/Intractable Nausea/vomiting -Dr. Roderic Palau discussed with endocrineDr. Dorris Fetch. Felt that hypoglycemia is related to severe malnutrition and depleted glycogen stores. Recommendations were to keep the patient on dextrose infusion while in the hospital. Also, keep gloves on patient's hands to help keep them warm. This may improve accuracy of CBG. He recommended holding off on further work-up for insulinoma in current setting since results may be skewed. This will be further followed up as an outpatient. Overall blood sugars are better with dextrose infusion.Her nausea has improved and she has been making efforts to eat on her own.  Declines PEG tube placement. -continue D5/0.9NS KCL20 meq at 75cc/hr  Improving, intractable nausea/vomiting -Associated with significant weight loss -related to severe reflux esophagitis and gastroparesis -05/19/19--EGD--severe reflux esophagitis -GES--confirms gastroparesis -reglan increased to 10 mg tid -GI following and assisting with further management -Declines PEG tube placement. -Continue to encourage increase in oral protein calorie intake  Nonsustained asymptomatic V. Tach Reported 8 runs of V. Tach on 11/14/19, non sustained and asymptomatic Continue to monitor  Hypertrophic CM -appreciate cardiology consult-->From a purely cardiac standpoint there is no contraindication to her surgery, defer considerations regarding her noncardiac comorbidities to primary team. No additional cardiac testing planned at this time.  Hypoglycemia secondary to glycogen stores depletion -11/01/19 A1c--5.0 -not on any hypoglycemics -hypoglycemic epidoses improved on IV  dextrose infusion  Dilated SB loops -likely ileus, not  clinically obstructed -6/29 CT abd/pelv--dilated fluid filled SB loops, many lie anterior to stomach. Bibasilar bronchiectasis -passing flatus; had small BM 11/13/2019  Chronic respiratory failure with hypoxia -On 3 L nasal cannula at baseline -Maintain O2 saturation greater than 90% -Continue bronchodilators  Pancreatic lesion: -MRCP this admission markedly motion degraded. Will need CT abd with pancreatic protocol in May 2022 as she did not have a good quality study with MRI in setting of motion degradation.  Essential HTN -Blood pressure is at goal -continue coreg and HTN  Hypothyroidism -continue home dose synthroid -TSH 10.589 -Repeat TSH in 6 weeks when acute illness has resolved  Scleroderma -on chronic prednisone  Oral thrush Reports burning in her mouth Satellite lesions below her tongue Conitnue Magic mouthwash with lidocaine  Hepatic steatosis/Anasarca -check urine protein/creatiine ratio--0.12 -continue IV furosemide 40 bid -likely due to hypoalbuminemia -Net INO +11.9 L -Need strict I's and O's and daily weight for accuracy  Hypokalemia, likely from diuretic Serum potassium 3.3 Repleted intravenously Add oral potassium while on Lasix Serum magnesium 1.9  Resolved post repletion: Hypomagnesemia Serum magnesium 1.3> 1.9 Repleted with 4 g IV magnesium once  Goals of Care -patient is DNR -Palliative care following and assisting with establishing goals of care   Status is: Inpatient  Remains inpatient appropriate due to severity of illness.  Requiring dextrose intravenously.  Dispo: The patient is from:Home Anticipated d/c is to:SNF. Anticipated d/c date is: 11/17/2019. Patient currently is not medically stable to d/c.        Family Communication:updated son on phone 7/4  Consultants:GI, general surgery, cardiology;  palliative  Code Status: DNR  DVT Prophylaxis: [place] Heparin 3 times daily.   Procedures: As Listed in Progress Note Above  Antibiotics: None      Objective: Vitals:   11/14/19 2014 11/14/19 2026 11/15/19 0500 11/15/19 0812  BP:  107/76 117/60   Pulse: 92 84 94   Resp: 18 20 (!) 22   Temp:  98.7 F (37.1 C) 98.3 F (36.8 C)   TempSrc:  Oral Oral   SpO2: 96%  97% 94%  Weight:      Height:        Intake/Output Summary (Last 24 hours) at 11/15/2019 1201 Last data filed at 11/15/2019 0900 Gross per 24 hour  Intake 1460 ml  Output 1600 ml  Net -140 ml   Filed Weights   11/01/19 1448 11/01/19 2118  Weight: 68.9 kg 69.6 kg    Exam:  . General: 69 y.o. year-old female frail in no acute distress.  Alert and oriented x3.   . Cardiovascular: Regular rate and rhythm no rubs or gallops.   Marland Kitchen Respiratory: Clear to auscultation no wheezes or rales.  .  Abdomen: Soft nontender normal bowel sounds present.   . Musculoskeletal: No lower extremity edema bilaterally.   Marland Kitchen Psychiatry: Mood is appropriate for condition and setting.  Data Reviewed: CBC: Recent Labs  Lab 11/09/19 0459 11/10/19 0453 11/12/19 0513 11/15/19 0615  WBC 6.1 6.7 6.7 8.9  HGB 9.3* 9.5* 8.3* 9.2*  HCT 27.2* 27.5* 24.9* 27.7*  MCV 86.3 84.9 85.9 87.4  PLT 183 244 259 454   Basic Metabolic Panel: Recent Labs  Lab 11/09/19 0459 11/10/19 0453 11/12/19 0513 11/14/19 0508 11/15/19 0615  NA 135 134* 137 139 138  K 4.0 3.4* 3.0* 3.4* 3.3*  CL 102 102 105 107 106  CO2 24 23 24  21* 23  GLUCOSE 129* 145* 122* 141* 81  BUN 18 19 16 15  15  CREATININE 1.19* 1.14* 0.99 0.93 0.82  CALCIUM 7.8* 7.8* 7.5* 8.0* 7.6*  MG  --   --   --  1.3* 1.9  PHOS  --   --   --   --  2.6   GFR: Estimated Creatinine Clearance: 64.4 mL/min (by C-G formula based on SCr of 0.82 mg/dL). Liver Function Tests: Recent Labs  Lab 11/09/19 0459 11/10/19 0453 11/12/19 0513 11/14/19 0508  AST 13* 11* 10* 11*  ALT 10 8  8 9   ALKPHOS 66 73 78 80  BILITOT 0.7 0.7 0.8 0.7  PROT 5.5* 5.5* 5.4* 5.7*  ALBUMIN 2.0* 2.0* 1.7* 1.8*   No results for input(s): LIPASE, AMYLASE in the last 168 hours. No results for input(s): AMMONIA in the last 168 hours. Coagulation Profile: No results for input(s): INR, PROTIME in the last 168 hours. Cardiac Enzymes: No results for input(s): CKTOTAL, CKMB, CKMBINDEX, TROPONINI in the last 168 hours. BNP (last 3 results) No results for input(s): PROBNP in the last 8760 hours. HbA1C: No results for input(s): HGBA1C in the last 72 hours. CBG: Recent Labs  Lab 11/15/19 0619 11/15/19 0642 11/15/19 0747 11/15/19 0756 11/15/19 1128  GLUCAP 31* 66* 14* 79 110*   Lipid Profile: No results for input(s): CHOL, HDL, LDLCALC, TRIG, CHOLHDL, LDLDIRECT in the last 72 hours. Thyroid Function Tests: Recent Labs    11/13/19 0541  TSH 4.395  FREET4 1.26*   Anemia Panel: No results for input(s): VITAMINB12, FOLATE, FERRITIN, TIBC, IRON, RETICCTPCT in the last 72 hours. Urine analysis:    Component Value Date/Time   COLORURINE YELLOW 08/17/2019 [place] 08/17/2019 1134   LABSPEC 1.006 08/17/2019 1134   PHURINE 7.0 08/17/2019 1134   GLUCOSEU NEGATIVE 08/17/2019 1134   GLUCOSEU NEGATIVE 11/30/2013 1057   HGBUR NEGATIVE 08/17/2019 1134   BILIRUBINUR NEGATIVE 12/09/2018 0923   KETONESUR NEGATIVE 08/17/2019 1134   PROTEINUR NEGATIVE 08/17/2019 1134   UROBILINOGEN 0.2 11/30/2013 1057   NITRITE NEGATIVE 08/17/2019 1134   LEUKOCYTESUR NEGATIVE 08/17/2019 1134   Sepsis Labs: @LABRCNTIP (procalcitonin:4,lacticidven:4)  ) Recent Results (from the past 240 hour(s))  Culture, Urine     Status: Abnormal   Collection Time: 11/10/19  8:06 PM   Specimen: Urine, Random  Result Value Ref Range Status   Specimen Description   Final    URINE, RANDOM Performed at [facility], [address], [place], [place] [address]    Special Requests   Final    NONE Performed at  [facility], [address] [place], [place] [address]    Culture MULTIPLE SPECIES PRESENT, SUGGEST RECOLLECTION (A)  Final   Report Status 11/12/2019 FINAL  Final      Studies: No results found.  Scheduled Meds: . aspirin EC  81 mg Oral Daily  . carvedilol  25 mg Oral BID  . dextrose      . feeding supplement  1 Container Oral TID BM  . folic acid  1 mg Oral Daily  . furosemide  40 mg Intravenous BID  . heparin  5,000 Units Subcutaneous Q8H  . latanoprost  1 drop Both Eyes QHS  . levothyroxine  88 mcg Oral QAC breakfast  . magic mouthwash w/lidocaine  5 mL Oral TID  . metoCLOPramide (REGLAN) injection  10 mg Intravenous TID AC  . mometasone-formoterol  2 puff Inhalation BID  . multivitamin with minerals  1 tablet Oral Daily  . pantoprazole (PROTONIX) IV  40 mg Intravenous Q12H  . predniSONE  5 mg  Oral Daily  . vitamin B-12  500 mcg Oral Daily    Continuous Infusions: . dextrose 5 % and 0.9 % NaCl with KCl 20 mEq/L 75 mL/hr at 11/14/19 2141  . potassium chloride 10 mEq (11/15/19 1118)     LOS: 13 days     Kayleen Memos, MD [facility] Pager [phone]  If 7PM-7AM, please contact night-coverage [email or web address] Password [email or web address] 11/15/2019, 12:01 PM

## 2019-11-16 ENCOUNTER — Encounter (HOSPITAL_COMMUNITY)
Admission: EM | Disposition: A | Discharge status: Hospice | Payer: Self-pay | Source: Ambulatory Visit | Attending: Internal Medicine

## 2019-11-16 DIAGNOSIS — Z515 Encounter for palliative care: Secondary | ICD-10-CM

## 2019-11-16 DIAGNOSIS — J4530 Mild persistent asthma, uncomplicated: Secondary | ICD-10-CM

## 2019-11-16 LAB — PHOSPHORUS: Phosphorus: 2.3 mg/dL — ABNORMAL LOW (ref 2.5–4.6)

## 2019-11-16 LAB — CBC
HCT: 23.3 % — ABNORMAL LOW (ref 36.0–46.0)
Hemoglobin: 8.1 g/dL — ABNORMAL LOW (ref 12.0–15.0)
MCH: 29.5 pg (ref 26.0–34.0)
MCHC: 34.8 g/dL (ref 30.0–36.0)
MCV: 84.7 fL (ref 80.0–100.0)
Platelets: 263 10*3/uL (ref 150–400)
RBC: 2.75 MIL/uL — ABNORMAL LOW (ref 3.87–5.11)
RDW: 14.6 % (ref 11.5–15.5)
WBC: 9.1 10*3/uL (ref 4.0–10.5)
nRBC: 0 % (ref 0.0–0.2)

## 2019-11-16 LAB — BASIC METABOLIC PANEL
Anion gap: 7 (ref 5–15)
BUN: 15 mg/dL (ref 8–23)
CO2: 24 mmol/L (ref 22–32)
Calcium: 7.6 mg/dL — ABNORMAL LOW (ref 8.9–10.3)
Chloride: 107 mmol/L (ref 98–111)
Creatinine, Ser: 0.89 mg/dL (ref 0.44–1.00)
GFR calc Af Amer: >60 mL/min (ref >60)
GFR calc non Af Amer: >60 mL/min (ref >60)
Glucose, Bld: 138 mg/dL — ABNORMAL HIGH (ref 70–99)
Potassium: 3.3 mmol/L — ABNORMAL LOW (ref 3.5–5.1)
Sodium: 138 mmol/L (ref 135–145)

## 2019-11-16 LAB — MAGNESIUM: Magnesium: 1.7 mg/dL (ref 1.7–2.4)

## 2019-11-16 LAB — GLUCOSE, CAPILLARY
Glucose-Capillary: 164 mg/dL — ABNORMAL HIGH (ref 70–99)
Glucose-Capillary: 122 mg/dL — ABNORMAL HIGH (ref 70–99)
Glucose-Capillary: 71 mg/dL (ref 70–99)
Glucose-Capillary: 159 mg/dL — ABNORMAL HIGH (ref 70–99)
Glucose-Capillary: 177 mg/dL — ABNORMAL HIGH (ref 70–99)

## 2019-11-16 SURGERY — LAPAROTOMY, EXPLORATORY
Anesthesia: General

## 2019-11-16 MED ORDER — POTASSIUM CHLORIDE CRYS ER 20 MEQ PO TBCR
60.0000 meq | EXTENDED_RELEASE_TABLET | Freq: Every day | ORAL | Status: DC
  Administered 2019-11-16: 60 meq via ORAL
  Filled 2019-11-16: qty 3

## 2019-11-16 MED ORDER — GLUCOSE 4 G PO CHEW
2.0000 | CHEWABLE_TABLET | Freq: Every day | ORAL | Status: DC
  Administered 2019-11-16 – 2019-11-17 (×5): 8 g via ORAL
  Filled 2019-11-16 (×12): qty 2

## 2019-11-16 MED ORDER — DEXTROSE 10 % IV SOLN
INTRAVENOUS | Status: DC
  Filled 2019-11-16: qty 1000

## 2019-11-16 NOTE — Progress Notes (Signed)
PROGRESS NOTE  [name] GUR:[id number] DOB: 04-12-1951 DOA: 11/01/2019 PCP: Maryruth Hancock, MD  HPI/Recap of past 24 hours: 68 y.o.femalewith medical history significant forCOPD asthma with chronic respiratory failure on 3 L, paroxysmal atrial fibrillation,HOCM,liver cirrhosis, diabetes mellitus. Patient was sent to the ED via EMS from her endocrinologist'soffice with blood sugar of 32,she was given D50 and blood sugar increased to 47, but on arrival to the ED, her blood sugar was 20.Patient has hadprofound weight loss of greater than 150 pounds in the past 2 years (weight stable last 6 months), dysphagia to solids/pills, recent diagnosis of scleroderma.  Over the past 3 months she has been vomiting every day, at least 2-3 times a day, she has been vomiting almostevery thing shetries to eat,withresulted in weight loss. She reports chronic intermittent abdominal pain. But she has no abdominal pain this admission. No loose stools. Patient is not on any medications for her diabetes.  She has chronic lower extremity swelling that that fluctuates but is currently unchanged,no chest pain, no difficulty breathing.  She continued to having vomiting during this admission despite conservative measures.  IR did not feel her anatomy was conducive for PEG.  General surgery has been consulted to place open gastrostomy tube.  Palliative medicine was consulted to discuss [place].  11/16/19: Seen and examined at her bedside this morning.  She is alert and oriented x3.  She is feeding herself but still having emesis.  Wants to go home.   Assessment/Plan: Principal Problem:   Hypoglycemia Active Problems:   Type 2 diabetes mellitus without complication, without long-term current use of insulin (HCC)   Essential hypertension   Asthma, chronic   Chronic respiratory failure with hypoxia (HCC)   ILD (interstitial lung disease) (HCC)   Hypothyroidism   Hepatic cirrhosis (HCC)   Paroxysmal  atrial fibrillation (HCC)   Loss of weight   Abnormal CT scan   Intractable vomiting   Adult failure to thrive   Preoperative cardiovascular examination   Goals of care, counseling/discussion   Palliative care by specialist   Hypoglycemia/FTT/Intractable Nausea/vomiting -Dr. Roderic Palau discussed with endocrineDr. Dorris Fetch. Felt that hypoglycemia is related to severe malnutrition and depleted glycogen stores. Recommendations were to keep the patient on dextrose infusion while in the hospital. Also, keep gloves on patient's hands to help keep them warm. This may improve accuracy of CBG. He recommended holding off on further work-up for insulinoma in current setting since results may be skewed. This will be further followed up as an outpatient. Overall blood sugars are better with dextrose infusion.Her nausea has improved and she has been making efforts to eat on her own.  Declines PEG tube placement. -continue D5/0.9NS KCL20 meq at 75cc/hr  Improving, intractable nausea/vomiting -Associated with significant weight loss -related to severe reflux esophagitis and gastroparesis -05/19/19--EGD--severe reflux esophagitis -GES--confirms gastroparesis -reglan increased to 10 mg tid -GI following and assisting with further management -Declines PEG tube placement. -Continue to encourage increase in oral protein calorie intake  Nonsustained asymptomatic V. Tach Reported 8 runs of V. Tach on 11/14/19, non sustained and asymptomatic  Hypertrophic CM -appreciate cardiology consult-->From a purely cardiac standpoint there is no contraindication to her surgery, defer considerations regarding her noncardiac comorbidities to primary team. No additional cardiac testing planned at this time.  Hypoglycemia secondary to glycogen stores depletion -11/01/19 A1c--5.0 -not on any hypoglycemics -hypoglycemic epidoses improved on IV dextrose infusion -ADDED ORAL GLUCOSE TABS/GEL SCHEDULED AROUND THE CLOCK  11/16/19  Dilated SB loops -likely ileus, not clinically  obstructed -6/29 CT abd/pelv--dilated fluid filled SB loops, many lie anterior to stomach. Bibasilar bronchiectasis -passing flatus; had small BM 11/13/2019  Chronic respiratory failure with hypoxia -On 3 L nasal cannula at baseline -Maintain O2 saturation greater than 90% -Continue bronchodilators  Pancreatic lesion: -MRCP this admission markedly motion degraded. Will need CT abd with pancreatic protocol in May 2022 as she did not have a good quality study with MRI in setting of motion degradation.  Essential HTN -Blood pressure is at goal -continue coreg and HTN  Hypothyroidism -continue home dose synthroid -TSH 10.589 -Repeat TSH in 6 weeks when acute illness has resolved  Scleroderma -on chronic prednisone  Oral thrush Reports burning in her mouth Satellite lesions below her tongue Conitnue Magic mouthwash with lidocaine  Hepatic steatosis/Anasarca -check urine protein/creatiine ratio--0.12 -continue IV furosemide 40 bid -likely due to hypoalbuminemia -Net INO +11.9 L -Need strict I's and O's and daily weight for accuracy  Hypokalemia, likely from diuretic Serum potassium 3.3 Repleted intravenously Add oral potassium while on Lasix Serum magnesium 1.9  Resolved post repletion: Hypomagnesemia Serum magnesium 1.3> 1.9 Repleted with 4 g IV magnesium once  Goals of Care -patient is DNR -Palliative care following and assisting with establishing goals of care   Status is: Inpatient  Remains inpatient appropriate due to severity of illness.  Requiring dextrose intravenously.  PALLIATIVE CARE TEAM WORKING WITH FAMILY AND PATIENT REGARDING DISPOSITION  Dispo: The patient is from:Home Anticipated d/c is to:SNF. Anticipated d/c date is: 11/17/2019. Patient currently is not medically stable to d/c.    Family Communication:updated son on  phone  Consultants:GI, general surgery, cardiology; palliative  Code Status: DNR  DVT Prophylaxis: [place] Heparin 3 times daily.   Procedures: As Listed in Progress Note Above  Antibiotics: None   Objective: Vitals:   11/16/19 0510 11/16/19 0759 11/16/19 1349 11/16/19 1455  BP: 97/68  97/69 104/68  Pulse: 96  97 90  Resp: 16  19 16   Temp: 98.9 F (37.2 C)  99.6 F (37.6 C) 98.2 F (36.8 C)  TempSrc: Oral  Oral Oral  SpO2: 100% 98%  100%  Weight:      Height:        Intake/Output Summary (Last 24 hours) at 11/16/2019 1616 Last data filed at 11/16/2019 0900 Gross per 24 hour  Intake 240 ml  Output 1350 ml  Net -1110 ml   Filed Weights   11/01/19 1448 11/01/19 2118 11/16/19 0500  Weight: 68.9 kg 69.6 kg 71 kg    Exam:  . General: 69 y.o. year-old female frail in no acute distress.  Alert and oriented x3  Pt appears emaciated and chronically ill.   . Cardiovascular: Regular rate and rhythm no rubs or gallops.   Marland Kitchen Respiratory: BBS Clear to auscultation no wheezes or rales.  .  Abdomen: Soft nontender normal bowel sounds present.   . Musculoskeletal: No lower extremity edema bilaterally.    Marland Kitchen Psychiatry: Mood is appropriate for condition and setting.   Data Reviewed: CBC: Recent Labs  Lab 11/10/19 0453 11/12/19 0513 11/15/19 0615 11/16/19 0506  WBC 6.7 6.7 8.9 9.1  HGB 9.5* 8.3* 9.2* 8.1*  HCT 27.5* 24.9* 27.7* 23.3*  MCV 84.9 85.9 87.4 84.7  PLT 244 259 279 270   Basic Metabolic Panel: Recent Labs  Lab 11/10/19 0453 11/12/19 0513 11/14/19 0508 11/15/19 0615 11/16/19 0506  NA 134* 137 139 138 138  K 3.4* 3.0* 3.4* 3.3* 3.3*  CL 102 105 107 106 107  CO2 23  24 21* 23 24  GLUCOSE 145* 122* 141* 81 138*  BUN 19 16 15 15 15   CREATININE 1.14* 0.99 0.93 0.82 0.89  CALCIUM 7.8* 7.5* 8.0* 7.6* 7.6*  MG  --   --  1.3* 1.9 1.7  PHOS  --   --   --  2.6 2.3*   GFR: Estimated Creatinine Clearance: 59.3 mL/min (by C-G formula based on SCr of 0.89  mg/dL). Liver Function Tests: Recent Labs  Lab 11/10/19 0453 11/12/19 0513 11/14/19 0508  AST 11* 10* 11*  ALT 8 8 9   ALKPHOS 73 78 80  BILITOT 0.7 0.8 0.7  PROT 5.5* 5.4* 5.7*  ALBUMIN 2.0* 1.7* 1.8*   No results for input(s): LIPASE, AMYLASE in the last 168 hours. No results for input(s): AMMONIA in the last 168 hours. Coagulation Profile: No results for input(s): INR, PROTIME in the last 168 hours. Cardiac Enzymes: No results for input(s): CKTOTAL, CKMB, CKMBINDEX, TROPONINI in the last 168 hours. BNP (last 3 results) No results for input(s): PROBNP in the last 8760 hours. HbA1C: No results for input(s): HGBA1C in the last 72 hours. CBG: Recent Labs  Lab 11/15/19 2011 11/15/19 2331 11/16/19 0134 11/16/19 0728 11/16/19 1053  GLUCAP 154* 159* 164* 122* 71   Lipid Profile: No results for input(s): CHOL, HDL, LDLCALC, TRIG, CHOLHDL, LDLDIRECT in the last 72 hours. Thyroid Function Tests: No results for input(s): TSH, T4TOTAL, FREET4, T3FREE, THYROIDAB in the last 72 hours. Anemia Panel: No results for input(s): VITAMINB12, FOLATE, FERRITIN, TIBC, IRON, RETICCTPCT in the last 72 hours. Urine analysis:    Component Value Date/Time   COLORURINE YELLOW 08/17/2019 [place] 08/17/2019 1134   LABSPEC 1.006 08/17/2019 1134   PHURINE 7.0 08/17/2019 1134   GLUCOSEU NEGATIVE 08/17/2019 1134   GLUCOSEU NEGATIVE 11/30/2013 1057   HGBUR NEGATIVE 08/17/2019 1134   BILIRUBINUR NEGATIVE 12/09/2018 0923   KETONESUR NEGATIVE 08/17/2019 1134   PROTEINUR NEGATIVE 08/17/2019 1134   UROBILINOGEN 0.2 11/30/2013 1057   NITRITE NEGATIVE 08/17/2019 1134   LEUKOCYTESUR NEGATIVE 08/17/2019 1134    Recent Results (from the past 240 hour(s))  Culture, Urine     Status: Abnormal   Collection Time: 11/10/19  8:06 PM   Specimen: Urine, Random  Result Value Ref Range Status   Specimen Description   Final    URINE, RANDOM Performed at [facility], [address],  [place], [place] [address]    Special Requests   Final    NONE Performed at [facility], [address] [place], [place] [address]    Culture MULTIPLE SPECIES PRESENT, SUGGEST RECOLLECTION (A)  Final   Report Status 11/12/2019 FINAL  Final      Studies: No results found.  Scheduled Meds: . aspirin EC  81 mg Oral Daily  . carvedilol  25 mg Oral BID  . feeding supplement  1 Container Oral TID BM  . folic acid  1 mg Oral Daily  . furosemide  40 mg Intravenous BID  . glucose  2 tablet Oral 5 X Daily  . heparin  5,000 Units Subcutaneous Q8H  . latanoprost  1 drop Both Eyes QHS  . levothyroxine  88 mcg Oral QAC breakfast  . magic mouthwash w/lidocaine  5 mL Oral TID  . metoCLOPramide (REGLAN) injection  10 mg Intravenous TID AC  . mometasone-formoterol  2 puff Inhalation BID  . multivitamin with minerals  1 tablet Oral Daily  . pantoprazole (PROTONIX) IV  40 mg Intravenous Q12H  .  potassium chloride  60 mEq Oral Daily  . predniSONE  5 mg Oral Daily  . vitamin B-12  500 mcg Oral Daily    Continuous Infusions: . dextrose 50 mL/hr at 11/16/19 0931     LOS: 14 days   Irwin Brakeman, MD [facility] How to contact the [facility] Attending or Consulting provider [place] or covering provider during after hours [place], for this patient?  1. Check the care team in [facility] and look for a) attending/consulting [facility] provider listed and b) the [facility] team listed 2. Log into [email or web address] and use [place]'s universal password to access. If you do not have the password, please contact the hospital operator. 3. Locate the [facility] provider you are looking for under [facility] and page to a number that you can be directly reached. 4. If you still have difficulty reaching the provider, please page the [facility] (Director on Call) for the Hospitalists listed on amion for assistance.  If 7PM-7AM, please contact night-coverage [email or web address] Password [email or web address] 11/16/2019, 4:16 PM

## 2019-11-16 NOTE — Progress Notes (Addendum)
Before lunch had accumulated 70 mls emesis in bag.  For lunch ate all of soup, pudding, tea and some of jello. At 1550 no further emesis.

## 2019-11-16 NOTE — Progress Notes (Signed)
Palliative:  HPI: 69 y.o. female  with past medical history of COPD on 3L, OSA, diastolic heart failure, hypertrophic obstructive cardiomyopathy, HTN, HLD, paroxysmal atrial fibrillation, scleroderma, liver cirrhosis, diabetes admitted on 11/01/2019 from endocrinologist office with hypoglycemia. Chronic vomiting over past ~3 months with weight loss and occasional abdominal pain. Ongoing hypoglycemia related to severe malnutrition and depleted glycogen stores per Dr. Dorris Fetch endocrinologist. Also with severe reflux and gastroparesis now on Reglan. Also concern for bronchitis.   I have had discussions with [name] and discussions with her sons, [name] and [name], and daughter-in-law [name] on multiple occasions throughout the day. I discussed plans and options with Dr. Wynetta Emery and Dr. Constance Haw. [name] confirms again today that she is NOT interested in pursuing surgery for feeding tube placement even if she knows she will not live long without this. She is consistent and is clear that she does not wish to discuss this anymore. I explained to family her wishes and concern with gastroparesis, severe malnutrition, depleted glycogen stores that surgery is very risky and we have great concern that even feeding tube will be the answer to improve her health at this time. I explain that her body has to be able to tolerate and absorb any nutrients that we provide it even with feeding tube and it is likely that she would be unable to do that. We discussed that there are not really any other options that will be a fix for this problem at this stage. She has declined so drastically and is so frail and weak (she can barely hold her eyes open during my brief visit) that I fear her health has declined to a point that we are unable to recover. They understand.   We did discuss that we will try and give her glucose in pill or gel to try and buy some time but there is a high chance that this will not be effective at all but we will  try to respect wishes to try and do everything to give her some more time. She still wishes to go home and I encourage family to continue to consider if there is any way this could be possible as it could very likely be for a very short period of time as prognosis is poor. She is agreeable to hospice but does not wish to go to hospice facility due to bad memories of her son dying there previously. This is a difficult and delicate situation and will take some more time and discussion.   All questions/concerns addressed. Emotional support provided. Discussed with Dr. Wynetta Emery and Dr. Constance Haw.   Exam: Alert and oriented although fatigued. Cachectic. Breathing regular, unlabored. Abd soft. Depressed mood. Frustration with her situation.  Plan: - Ongoing conversations with patient and family about next steps.   [place], NP Palliative Medicine Team Pager [phone] (Please see [email or web address] for schedule) Team Phone [phone]    Greater than 50%  of this time was spent counseling and coordinating care related to the above assessment and plan

## 2019-11-16 NOTE — Progress Notes (Signed)
Ate about 25% of supper tray and vomited about 150 mls.  Had 3 visitors today

## 2019-11-16 NOTE — Progress Notes (Signed)
Physical Therapy Treatment Patient Details Name: [name] MRN: [id number] DOB: 1950/12/08 Today's Date: 11/16/2019    History of Present Illness [name] is a 69 y.o. female with medical history significant for COPD asthma with chronic respiratory failure on 3 L, paroxysmal atrial fibrillation, HOCM,liver cirrhosis, diabetes mellitus.Patient was sent to the ED via EMS from her endocrinologist's office with blood sugar of 32, she was given D50 and blood sugar increased to 47, but on arrival to the ED, her blood sugar was 20.Over the past 3 months she has been vomiting every day, at least 2-3 times a day, she has been vomiting almost every thing she tries to eat, with resulted in weight loss.  She reports chronic intermittent abdominal pain.  But she has no abdominal pain today.  No loose stools. Patient is not on any medications for her diabetes.    PT Comments    Patient demonstrates good return for completing BLE ROM/strengthening exercises with occasional verbal cueing while seated at bedside, had most difficulty completing sit to stands due to BLE weakness and limited to a few side steps at bedside due to fall risk.  Patient tolerated sitting up in chair after therapy - nursing staff notified.  Patient will benefit from continued physical therapy in hospital and recommended venue below to increase strength, balance, endurance for safe ADLs and gait.     Follow Up Recommendations  SNF     Equipment Recommendations  None recommended by PT    Recommendations for Other Services       Precautions / Restrictions Precautions Precautions: Fall Restrictions Weight Bearing Restrictions: No    Mobility  Bed Mobility Overal bed mobility: Needs Assistance Bed Mobility: Supine to Sit     Supine to sit: Mod assist     General bed mobility comments: slow labored movement requiring assistance to move legs and propping up on elbows  Transfers Overall transfer level: Needs  assistance Equipment used: Rolling walker (2 wheeled) Transfers: Sit to/from [facility] Sit to Stand: Mod assist;Max assist Stand pivot transfers: Mod assist       General transfer comment: most difficulty for sit to stands due to BLE weakness  Ambulation/Gait Ambulation/Gait assistance: Mod assist Gait Distance (Feet): 5 Feet Assistive device: Rolling walker (2 wheeled) Gait Pattern/deviations: Decreased step length - right;Decreased step length - left;Decreased stride length Gait velocity: decreased   General Gait Details: limited to 5-6 slow labored unsteady steps at bedside due to weakness and poor standing balance   Stairs             Wheelchair Mobility    Modified Rankin (Stroke Patients Only)       Balance Overall balance assessment: Needs assistance Sitting-balance support: Feet supported;No upper extremity supported Sitting balance-Leahy Scale: Fair Sitting balance - Comments: fair/good seated EOB   Standing balance support: Bilateral upper extremity supported;During functional activity Standing balance-Leahy Scale: Poor Standing balance comment: standing with using RW                            Cognition Arousal/Alertness: Awake/alert Behavior During Therapy: WFL for tasks assessed/performed Overall Cognitive Status: Within Functional Limits for tasks assessed                                        Exercises General Exercises - Lower Extremity Long Arc  Quad: Seated;AROM;Strengthening;Both;10 reps Hip Flexion/Marching: Seated;AROM;Strengthening;Both;10 reps Toe Raises: Seated;AROM;Strengthening;Both;10 reps Heel Raises: Seated;AROM;Strengthening;Both;10 reps    General Comments        Pertinent Vitals/Pain Pain Assessment: No/denies pain    Home Living                      Prior Function            PT Goals (current goals can now be found in the care plan section) Acute Rehab PT  Goals Patient Stated Goal: Go home PT Goal Formulation: With patient Time For Goal Achievement: 11/28/19 Potential to Achieve Goals: Fair Progress towards PT goals: Progressing toward goals    Frequency    Min 3X/week      PT Plan Current plan remains appropriate    Co-evaluation              AM-PAC PT "6 Clicks" Mobility   Outcome Measure  Help needed turning from your back to your side while in a flat bed without using bedrails?: A Lot Help needed moving from lying on your back to sitting on the side of a flat bed without using bedrails?: A Lot Help needed moving to and from a bed to a chair (including a wheelchair)?: A Lot Help needed standing up from a chair using your arms (e.g., wheelchair or bedside chair)?: A Lot Help needed to walk in hospital room?: A Lot Help needed climbing 3-5 steps with a railing? : Total 6 Click Score: 11    End of Session Equipment Utilized During Treatment: Oxygen Activity Tolerance: Patient tolerated treatment well;Patient limited by fatigue Patient left: in chair;with call bell/phone within reach;with chair alarm set Nurse Communication: Mobility status PT Visit Diagnosis: Unsteadiness on feet (R26.81);Other abnormalities of gait and mobility (R26.89);Muscle weakness (generalized) (M62.81)     Time: 1610-9604 PT Time Calculation (min) (ACUTE ONLY): 25 min  Charges:  $Therapeutic Exercise: 8-22 mins $Therapeutic Activity: 8-22 mins                     3:53 PM, 11/16/19 Lonell Grandchild, MPT Physical Therapist with [facility] 336 [phone] office [phone] mobile phone

## 2019-11-16 NOTE — Progress Notes (Addendum)
    Subjective: Nausea improved. Tolerated all of pudding yesterday for lunch and supper. Ate half of jello. No abdominal pain. Main complaint today is headache and eye pain for past 3 days. Continues to decline feeding tube.    Objective: Vital signs in last 24 hours: Temp:  [98.5 F (36.9 C)-99.3 F (37.4 C)] 98.9 F (37.2 C) (07/07 0510) Pulse Rate:  [87-96] 96 (07/07 0510) Resp:  [16-20] 16 (07/07 0510) BP: (97-120)/(68-83) 97/68 (07/07 0510) SpO2:  [95 %-100 %] 98 % (07/07 0759) Weight:  [71 kg] 71 kg (07/07 0500) Last BM Date: 11/15/19 General:   Alert and oriented, flat affect, mask-like features Head:  Normocephalic and atraumatic. Abdomen:  Bowel sounds present, soft, non-tender, non-distended. No HSM or hernias noted. No rebound or guarding. No masses appreciated  Neurologic:  Alert and  oriented x4  Intake/Output from previous day: 07/06 0701 - 07/07 0700 In: 240 [P.O.:240] Out: 1350 [Urine:1350] Intake/Output this shift: No intake/output data recorded.  Lab Results: Recent Labs    11/15/19 0615 11/16/19 0506  WBC 8.9 9.1  HGB 9.2* 8.1*  HCT 27.7* 23.3*  PLT 279 263   BMET Recent Labs    11/14/19 0508 11/15/19 0615 11/16/19 0506  NA 139 138 138  K 3.4* 3.3* 3.3*  CL 107 106 107  CO2 21* 23 24  GLUCOSE 141* 81 138*  BUN 15 15 15   CREATININE 0.93 0.82 0.89  CALCIUM 8.0* 7.6* 7.6*   LFT Recent Labs    11/14/19 0508  PROT 5.7*  ALBUMIN 1.8*  AST 11*  ALT 9  ALKPHOS 80  BILITOT 0.7    Assessment: 69 year old female with gastroparesis, severe esophagitis, profound weight loss in past two years, protein calorie malnutrition, scleroderma, overall failure to thrive, declining feeding tube. Patient high risk for surgical intervention and seems to be moving towards a palliative route. Appreciate Palliative assistance in this complicated case.   Clinically, she has noted improvement with Reglan at current dosing of 10 mg before meals, tolerating  small amounts of lunch and dinner without vomiting. She feels nausea and vomiting have improved in past 24 hours.   Limited options left at this time and recommend supportive measures as are being done. Continue with palliative discussions. We will follow peripherally at this time.   Plan: Continue Reglan TID Continue PPI BID Sit upright with eating Continue current diet, protein supplementation Appreciate palliative following Will follow peripherally for now   Annitta Needs, PhD, ANP-BC [facility] Gastroenterology   Attending note: No apparent side effects with Reglan.  Seems to be helping with her nausea.`   LOS: 14 days    11/16/2019, 8:16 AM

## 2019-11-17 LAB — BASIC METABOLIC PANEL
Anion gap: 8 (ref 5–15)
BUN: 16 mg/dL (ref 8–23)
CO2: 23 mmol/L (ref 22–32)
Calcium: 7.7 mg/dL — ABNORMAL LOW (ref 8.9–10.3)
Chloride: 106 mmol/L (ref 98–111)
Creatinine, Ser: 0.93 mg/dL (ref 0.44–1.00)
GFR calc Af Amer: >60 mL/min (ref >60)
GFR calc non Af Amer: >60 mL/min (ref >60)
Glucose, Bld: 175 mg/dL — ABNORMAL HIGH (ref 70–99)
Potassium: 3.1 mmol/L — ABNORMAL LOW (ref 3.5–5.1)
Sodium: 137 mmol/L (ref 135–145)

## 2019-11-17 LAB — CBC
HCT: 23.9 % — ABNORMAL LOW (ref 36.0–46.0)
Hemoglobin: 8.3 g/dL — ABNORMAL LOW (ref 12.0–15.0)
MCH: 29.5 pg (ref 26.0–34.0)
MCHC: 34.7 g/dL (ref 30.0–36.0)
MCV: 85.1 fL (ref 80.0–100.0)
Platelets: 277 10*3/uL (ref 150–400)
RBC: 2.81 MIL/uL — ABNORMAL LOW (ref 3.87–5.11)
RDW: 14.8 % (ref 11.5–15.5)
WBC: 11.1 10*3/uL — ABNORMAL HIGH (ref 4.0–10.5)
nRBC: 0 % (ref 0.0–0.2)

## 2019-11-17 LAB — GLUCOSE, CAPILLARY
Glucose-Capillary: 31 mg/dL — CL (ref 70–99)
Glucose-Capillary: 141 mg/dL — ABNORMAL HIGH (ref 70–99)
Glucose-Capillary: 106 mg/dL — ABNORMAL HIGH (ref 70–99)
Glucose-Capillary: 131 mg/dL — ABNORMAL HIGH (ref 70–99)
Glucose-Capillary: 122 mg/dL — ABNORMAL HIGH (ref 70–99)

## 2019-11-17 LAB — MAGNESIUM: Magnesium: 1.7 mg/dL (ref 1.7–2.4)

## 2019-11-17 MED ORDER — POTASSIUM CHLORIDE CRYS ER 20 MEQ PO TBCR
40.0000 meq | EXTENDED_RELEASE_TABLET | Freq: Two times a day (BID) | ORAL | Status: DC
  Administered 2019-11-17 – 2019-11-18 (×3): 40 meq via ORAL
  Filled 2019-11-17 (×4): qty 2

## 2019-11-17 MED ORDER — MAGNESIUM SULFATE 4 GM/100ML IV SOLN
4.0000 g | Freq: Once | INTRAVENOUS | Status: AC
  Administered 2019-11-17: 4 g via INTRAVENOUS
  Filled 2019-11-17: qty 100

## 2019-11-17 MED ORDER — GLUCOSE 40 % PO GEL
1.0000 | Freq: Two times a day (BID) | ORAL | Status: DC
  Administered 2019-11-17 – 2019-11-18 (×3): 37.5 g via ORAL
  Filled 2019-11-17 (×2): qty 1

## 2019-11-17 MED ORDER — SODIUM CHLORIDE 0.9 % IV BOLUS
500.0000 mL | Freq: Once | INTRAVENOUS | Status: AC
  Administered 2019-11-17: 500 mL via INTRAVENOUS

## 2019-11-17 MED ORDER — MAGNESIUM SULFATE 2 GM/50ML IV SOLN: 2.0000 g | Freq: Once | INTRAVENOUS | Status: DC

## 2019-11-17 MED ORDER — PREDNISONE 20 MG PO TABS
20.0000 mg | ORAL_TABLET | Freq: Every day | ORAL | Status: DC
  Administered 2019-11-17 – 2019-11-19 (×3): 20 mg via ORAL
  Filled 2019-11-17 (×5): qty 1

## 2019-11-17 NOTE — Progress Notes (Signed)
Palliative:  HPI: 69 y.o.femalewith past medical history of COPD on 3L, OSA, diastolic heart failure, hypertrophic obstructive cardiomyopathy, HTN, HLD, paroxysmal atrial fibrillation, scleroderma, liver cirrhosis, diabetesadmitted on 6/22/2021from endocrinologist office with hypoglycemia.Chronic vomiting over past ~3 months with weight loss and occasional abdominal pain. Ongoing hypoglycemia related to severe malnutrition and depleted glycogen stores per Dr. Dorris [name] endocrinologist. Also with severe reflux and gastroparesis now on Reglan. Also concern for bronchitis.Now requiring D10% infusion.   I met again today with [name]. She continues to be tired and weak. She is trying to eat but not able to keep much down overall. She shares that the glucose tablets are too big and they make her feel like she is choking. We discussed crushing them but she prefers to use the gel as she feels she will be able to get this down easier so I changed the orders to dextrose gel twice daily. I spoke with her about considering going to a facility with hospice if this is an option and she clarifies that this would not be the hospice facility and I reassure her it is not. She would be open but much rather prefers to be at home and she asks about [facility] being able to increase caregivers to be with her at home. I told her that I am unsure about this but may be a thought. She has very limited time from caregivers and she would need 24/7 caregivers so I doubt this would be a realistic option. I also spoke with her about attempting to decrease her dextrose IV fluids and she is okay with this but does not want to discuss the details of what this could mean and look like. She denies any pains or discomforts and I left her to try and eat her lunch after I assisted to set this up for her.   I called and spoke with her son, [name]. He also shares that his mother told him she cannot take the glucose tablets and I explained that I  have already changed this for her as she expressed the same to me today. He reports that she hasn't expressed anything else to him except for she does NOT want to go to hospice facility and wishes to return to her home. I discussed with [name] that once we begin decreasing IV fluids we should have a plan in place if her sugar drops if this should be turned back on as this would put [place] back in this same position vs focusing on ensuring she is comfortable. I expressed that I hate to ask him these questions but his mother does not wish to talk about it and he knows this. His wife [name] is unavailable and at work currently and he would like for me to discuss this with her tomorrow. They do plan to come visit with [name] this evening after work. I encouraged [name] to discuss with [name]. I worry that continuing aggressive measures to prolong life without the option to get her back to her home as she wishes is not the best option for her. We also discussed that her other son, [name], is currently in [place] but plans to return Sun or Mon. I imagine it will be important that he is able to spend time with [name] as well.   Exam: Alert and oriented although fatigued. Cachectic. Breathing regular, unlabored. Abd soft. Depressed mood and flat affect.   Plan: - Ongoing GOC conversations.  - Not ready for transition to comfort care yet.  -  Other son planning to return from out of country Sun/Mon.   11 min  Melissa Sill, NP Palliative Medicine Team Pager [phone] (Please see [email or web address] for schedule) Team Phone [phone]    Greater than 50%  of this time was spent counseling and coordinating care related to the above assessment and plan

## 2019-11-17 NOTE — Progress Notes (Signed)
PROGRESS NOTE  [name] NGE:[id number] DOB: [birth year] DOA: 11/01/2019 PCP: Maryruth Hancock, MD  HPI/Recap of past 24 hours: 69 y.o.femalewith medical history significant forCOPD asthma with chronic respiratory failure on 3 L, paroxysmal atrial fibrillation,HOCM,liver cirrhosis, diabetes mellitus. Patient was sent to the ED via EMS from her endocrinologist'soffice with blood sugar of 32,she was given D50 and blood sugar increased to 47, but on arrival to the ED, her blood sugar was 20.Patient has hadprofound weight loss of greater than 150 pounds in the past 2 years (weight stable last 6 months), dysphagia to solids/pills, recent diagnosis of scleroderma.  Over the past 3 months she has been vomiting every day, at least 2-3 times a day, she has been vomiting almostevery thing shetries to eat,withresulted in weight loss. She reports chronic intermittent abdominal pain. But she has no abdominal pain this admission. No loose stools. Patient is not on any medications for her diabetes.  She has chronic lower extremity swelling that that fluctuates but is currently unchanged,no chest pain, no difficulty breathing.  She continued to having vomiting during this admission despite conservative measures.  IR did not feel her anatomy was conducive for PEG.  General surgery has been consulted to place open gastrostomy tube.  Palliative medicine was consulted to discuss [place].  11/17/19: Seen and examined at her bedside this morning.  She is alert and oriented x3. Pt says she can't take the glucose tablets they make her feel choked when trying to swallow.      Assessment/Plan: Principal Problem:   Hypoglycemia Active Problems:   Type 2 diabetes mellitus without complication, without long-term current use of insulin (HCC)   Essential hypertension   Asthma, chronic   Chronic respiratory failure with hypoxia (HCC)   ILD (interstitial lung disease) (HCC)   Hypothyroidism   Hepatic  cirrhosis (HCC)   Paroxysmal atrial fibrillation (HCC)   Loss of weight   Abnormal CT scan   Intractable vomiting   Adult failure to thrive   Preoperative cardiovascular examination   Goals of care, counseling/discussion   Palliative care by specialist   Hypoglycemia/FTT/Intractable Nausea/vomiting -Dr. Roderic Palau discussed with endocrineDr. Dorris Fetch. Felt that hypoglycemia is related to severe malnutrition and depleted glycogen stores. Recommendations were to keep the patient on dextrose infusion while in the hospital. Also, keep gloves on patient's hands to help keep them warm. This may improve accuracy of CBG. He recommended holding off on further work-up for insulinoma in current setting since results may be skewed. This will be further followed up as an outpatient. Overall blood sugars are better with dextrose infusion.Her nausea has improved and she has been making efforts to eat on her own.  Declines PEG tube placement.  Improving, intractable nausea/vomiting -Associated with significant weight loss -related to severe reflux esophagitis and gastroparesis -05/19/19--EGD--severe reflux esophagitis -GES--confirms gastroparesis -reglan increased to 10 mg tid -GI following and assisting with further management -Declines PEG tube placement. -Continue to encourage increase in oral protein calorie intake  Nonsustained asymptomatic V. Tach non sustained and asymptomatic  Hypertrophic CM -appreciate cardiology consult-->From a purely cardiac standpoint there is no contraindication to her surgery, defer considerations regarding her noncardiac comorbidities to primary team. No additional cardiac testing planned at this time.  Hypoglycemia secondary to glycogen stores depletion -11/01/19 A1c--5.0 -not on any hypoglycemics -hypoglycemic epidoses improved on IV dextrose infusion -ADDED ORAL GLUCOSE GEL  Dilated SB loops -likely ileus, not clinically obstructed -6/29 CT  abd/pelv--dilated fluid filled SB loops, many lie anterior to stomach.  Bibasilar bronchiectasis -passing flatus; had small BM 11/13/2019  Chronic respiratory failure with hypoxia -On 3 L nasal cannula at baseline -Maintain O2 saturation greater than 90% -Continue bronchodilators  Pancreatic lesion: -MRCP this admission markedly motion degraded. Will need CT abd with pancreatic protocol in May 2022 as she did not have a good quality study with MRI in setting of motion degradation.  Essential HTN -Blood pressure is at goal -continue coreg and HTN  Hypothyroidism -continue home dose synthroid -TSH 10.589 -Repeat TSH in 6 weeks when acute illness has resolved  Scleroderma -on chronic prednisone  Oral thrush Reports burning in her mouth Satellite lesions below her tongue Conitnue Magic mouthwash with lidocaine  Hepatic steatosis/Anasarca -check urine protein/creatiine ratio--0.12 -continue IV furosemide 40 bid -likely due to hypoalbuminemia -Net INO +11.9 L -Need strict I's and O's and daily weight for accuracy  Hypokalemia, likely from diuretic Serum potassium 3.3 Repleted intravenously Add oral potassium while on Lasix Serum magnesium 1.9  Resolved post repletion: Hypomagnesemia Serum magnesium 1.3> 1.9 Repleted with 4 g IV magnesium once  Goals of Care -patient is DNR -Palliative care following and assisting with establishing goals of care  Status is: Inpatient  Remains inpatient appropriate due to severity of illness.  Requiring dextrose intravenously.  PALLIATIVE CARE TEAM WORKING WITH FAMILY AND PATIENT REGARDING DISPOSITION. DIFFICULT SITUATION.  REFUSES RESIDENTIAL HOSPICE.  WANTS HOME WITH HOSPICE BUT HAS NO FAMILY THAT CAN CARE FOR HER AT HOME.   Dispo: The patient is from:Home Anticipated d/c is to:SNF. Anticipated d/c date is: 11/18/2019. Patient currently is not medically stable to d/c unless for  hospice/full comfort care   Family Communication:updated son on phone  Consultants:GI, general surgery, cardiology; palliative  Code Status: DNR  DVT Prophylaxis: [place] Heparin 3 times daily.   Procedures: As Listed in Progress Note Above  Antibiotics: None   Objective: Vitals:   11/16/19 2110 11/17/19 0500 11/17/19 0527 11/17/19 0802  BP: 102/73  113/77   Pulse: 83  85   Resp: 16  16   Temp: 98.7 F (37.1 C)  99.1 F (37.3 C)   TempSrc: Oral  Oral   SpO2:   100% 99%  Weight:  70.2 kg    Height:        Intake/Output Summary (Last 24 hours) at 11/17/2019 1543 Last data filed at 11/17/2019 0900 Gross per 24 hour  Intake 1516.02 ml  Output 1050 ml  Net 466.02 ml   Filed Weights   11/01/19 2118 11/16/19 0500 11/17/19 0500  Weight: 69.6 kg 71 kg 70.2 kg    Exam:  . General: 69 y.o. year-old female frail in no acute distress.  Alert and oriented x3  Pt appears emaciated and chronically ill.   . Cardiovascular: Regular rate and rhythm no rubs or gallops.   Marland Kitchen Respiratory: BBS Clear to auscultation no wheezes or rales.  .  Abdomen: Soft nontender normal bowel sounds present.   . Musculoskeletal: No lower extremity edema bilaterally.    Marland Kitchen Psychiatry: Mood is appropriate for condition and setting.   Data Reviewed: CBC: Recent Labs  Lab 11/12/19 0513 11/15/19 0615 11/16/19 0506 11/17/19 0439  WBC 6.7 8.9 9.1 11.1*  HGB 8.3* 9.2* 8.1* 8.3*  HCT 24.9* 27.7* 23.3* 23.9*  MCV 85.9 87.4 84.7 85.1  PLT 259 279 263 627   Basic Metabolic Panel: Recent Labs  Lab 11/12/19 0513 11/14/19 0508 11/15/19 0615 11/16/19 0506 11/17/19 0439  NA 137 139 138 138 137  K 3.0* 3.4* 3.3* 3.3*  3.1*  CL 105 107 106 107 106  CO2 24 21* 23 24 23   GLUCOSE 122* 141* 81 138* 175*  BUN 16 15 15 15 16   CREATININE 0.99 0.93 0.82 0.89 0.93  CALCIUM 7.5* 8.0* 7.6* 7.6* 7.7*  MG  --  1.3* 1.9 1.7 1.7  PHOS  --   --  2.6 2.3*  --    GFR: Estimated Creatinine Clearance:  56.8 mL/min (by C-G formula based on SCr of 0.93 mg/dL). Liver Function Tests: Recent Labs  Lab 11/12/19 0513 11/14/19 0508  AST 10* 11*  ALT 8 9  ALKPHOS 78 80  BILITOT 0.8 0.7  PROT 5.4* 5.7*  ALBUMIN 1.7* 1.8*   No results for input(s): LIPASE, AMYLASE in the last 168 hours. No results for input(s): AMMONIA in the last 168 hours. Coagulation Profile: No results for input(s): INR, PROTIME in the last 168 hours. Cardiac Enzymes: No results for input(s): CKTOTAL, CKMB, CKMBINDEX, TROPONINI in the last 168 hours. BNP (last 3 results) No results for input(s): PROBNP in the last 8760 hours. HbA1C: No results for input(s): HGBA1C in the last 72 hours. CBG: Recent Labs  Lab 11/16/19 1053 11/16/19 1635 11/16/19 2001 11/17/19 0722 11/17/19 1110  GLUCAP 71 159* 177* 141* 106*   Lipid Profile: No results for input(s): CHOL, HDL, LDLCALC, TRIG, CHOLHDL, LDLDIRECT in the last 72 hours. Thyroid Function Tests: No results for input(s): TSH, T4TOTAL, FREET4, T3FREE, THYROIDAB in the last 72 hours. Anemia Panel: No results for input(s): VITAMINB12, FOLATE, FERRITIN, TIBC, IRON, RETICCTPCT in the last 72 hours. Urine analysis:    Component Value Date/Time   COLORURINE YELLOW 08/17/2019 [place] 08/17/2019 1134   LABSPEC 1.006 08/17/2019 1134   PHURINE 7.0 08/17/2019 1134   GLUCOSEU NEGATIVE 08/17/2019 1134   GLUCOSEU NEGATIVE 11/30/2013 1057   HGBUR NEGATIVE 08/17/2019 1134   BILIRUBINUR NEGATIVE 12/09/2018 0923   KETONESUR NEGATIVE 08/17/2019 1134   PROTEINUR NEGATIVE 08/17/2019 1134   UROBILINOGEN 0.2 11/30/2013 1057   NITRITE NEGATIVE 08/17/2019 1134   LEUKOCYTESUR NEGATIVE 08/17/2019 1134    Recent Results (from the past 240 hour(s))  Culture, Urine     Status: Abnormal   Collection Time: 11/10/19  8:06 PM   Specimen: Urine, Random  Result Value Ref Range Status   Specimen Description   Final    URINE, RANDOM Performed at [facility], [address], [place], [place] [address]    Special Requests   Final    NONE Performed at [facility], [address] [place], [place] [address]    Culture MULTIPLE SPECIES PRESENT, SUGGEST RECOLLECTION (A)  Final   Report Status 11/12/2019 FINAL  Final      Studies: No results found.  Scheduled Meds: . aspirin EC  81 mg Oral Daily  . carvedilol  25 mg Oral BID  . dextrose  1 Tube Oral BID  . feeding supplement  1 Container Oral TID BM  . folic acid  1 mg Oral Daily  . furosemide  40 mg Intravenous BID  . heparin  5,000 Units Subcutaneous Q8H  . latanoprost  1 drop Both Eyes QHS  . levothyroxine  88 mcg Oral QAC breakfast  . metoCLOPramide (REGLAN) injection  10 mg Intravenous TID AC  . mometasone-formoterol  2 puff Inhalation BID  . multivitamin with minerals  1 tablet Oral Daily  . pantoprazole (PROTONIX) IV  40 mg Intravenous Q12H  . potassium chloride  40 mEq Oral BID  . predniSONE  20 mg Oral Daily  . vitamin B-12  500 mcg Oral Daily    Continuous Infusions: . dextrose 50 mL/hr at 11/16/19 0931     LOS: 15 days   Irwin Brakeman, MD [facility] How to contact the [facility] Attending or Consulting provider [place] or covering provider during after hours [place], for this patient?  1. Check the care team in [facility] and look for a) attending/consulting [facility] provider listed and b) the [facility] team listed 2. Log into [email or web address] and use [place]'s universal password to access. If you do not have the password, please contact the hospital operator. 3. Locate the [facility] provider you are looking for under [facility] and page to a number that you can be directly reached. 4. If you still have difficulty reaching the provider, please page the [facility] (Director on Call) for the Hospitalists listed on amion for assistance.  If 7PM-7AM, please contact night-coverage [email or web address] Password [facility] 11/17/2019, 3:43 PM

## 2019-11-17 NOTE — Progress Notes (Signed)
Patient has had several episodes of emesis during shift.

## 2019-11-18 LAB — BASIC METABOLIC PANEL
Anion gap: 8 (ref 5–15)
BUN: 18 mg/dL (ref 8–23)
CO2: 23 mmol/L (ref 22–32)
Calcium: 7.3 mg/dL — ABNORMAL LOW (ref 8.9–10.3)
Chloride: 106 mmol/L (ref 98–111)
Creatinine, Ser: 1.09 mg/dL — ABNORMAL HIGH (ref 0.44–1.00)
GFR calc Af Amer: 60 mL/min — ABNORMAL LOW (ref >60)
GFR calc non Af Amer: 51 mL/min — ABNORMAL LOW (ref >60)
Glucose, Bld: 113 mg/dL — ABNORMAL HIGH (ref 70–99)
Potassium: 3.2 mmol/L — ABNORMAL LOW (ref 3.5–5.1)
Sodium: 137 mmol/L (ref 135–145)

## 2019-11-18 LAB — GLUCOSE, CAPILLARY
Glucose-Capillary: 121 mg/dL — ABNORMAL HIGH (ref 70–99)
Glucose-Capillary: 94 mg/dL (ref 70–99)
Glucose-Capillary: 91 mg/dL (ref 70–99)

## 2019-11-18 LAB — MAGNESIUM: Magnesium: 2.1 mg/dL (ref 1.7–2.4)

## 2019-11-18 MED ORDER — POLYVINYL ALCOHOL 1.4 % OP SOLN: 1.0000 [drp] | Freq: Four times a day (QID) | OPHTHALMIC | Status: DC | PRN

## 2019-11-18 MED ORDER — GLYCOPYRROLATE 0.2 MG/ML IJ SOLN
0.4000 mg | Freq: Four times a day (QID) | INTRAMUSCULAR | Status: DC
  Administered 2019-11-18 – 2019-11-21 (×12): 0.4 mg via INTRAVENOUS
  Filled 2019-11-18 (×13): qty 2

## 2019-11-18 MED ORDER — MAGNESIUM SULFATE 2 GM/50ML IV SOLN
2.0000 g | Freq: Once | INTRAVENOUS | Status: AC
  Administered 2019-11-18: 2 g via INTRAVENOUS
  Filled 2019-11-18: qty 50

## 2019-11-18 MED ORDER — BOOST / RESOURCE BREEZE PO LIQD CUSTOM: 1.0000 | Freq: Three times a day (TID) | ORAL | Status: DC | PRN

## 2019-11-18 MED ORDER — ACETAMINOPHEN 325 MG PO TABS: 650.0000 mg | ORAL_TABLET | Freq: Four times a day (QID) | ORAL | Status: DC | PRN

## 2019-11-18 MED ORDER — SODIUM CHLORIDE 0.9 % IV BOLUS
500.0000 mL | Freq: Once | INTRAVENOUS | Status: AC
  Administered 2019-11-18: 500 mL via INTRAVENOUS

## 2019-11-18 MED ORDER — SODIUM CHLORIDE 0.9 % IV BOLUS
1000.0000 mL | Freq: Once | INTRAVENOUS | Status: AC
  Administered 2019-11-18: 1000 mL via INTRAVENOUS

## 2019-11-18 MED ORDER — MORPHINE SULFATE (PF) 2 MG/ML IV SOLN
1.0000 mg | INTRAVENOUS | Status: DC | PRN
  Administered 2019-11-18: 1 mg via INTRAVENOUS
  Filled 2019-11-18: qty 1

## 2019-11-18 MED ORDER — BIOTENE DRY MOUTH MT LIQD: 15.0000 mL | OROMUCOSAL | Status: DC | PRN

## 2019-11-18 MED ORDER — ACETAMINOPHEN 650 MG RE SUPP: 650.0000 mg | Freq: Four times a day (QID) | RECTAL | Status: DC | PRN

## 2019-11-18 NOTE — Progress Notes (Signed)
Patient is comfort care and still has scheduled medications to be administered. Notified Dr. [place] [facility] if the Protonix IV and duleral need to be given as scheduled. Per MD give all medications except dulera. Will do and  continue to monitor.

## 2019-11-18 NOTE — Progress Notes (Signed)
Resting comfortably at this time, family at bedside

## 2019-11-18 NOTE — Progress Notes (Signed)
Pt refusing to eat lunch at this time. States she doesn't feel like it at this time. VS stable. Will cont to monitor.

## 2019-11-18 NOTE — Progress Notes (Signed)
Palliative:  I have called and spoken with Ms. Aday daughter-in-law, [name]. I have explained concern for worsening lethargy, refusal of lunch, hypotension unresponsive to fluid bolus and fever. She also told the nurse that she was tired of taking pills when given Tylenol to stay ahead of her fever. [name] will contact her husband now and find someone to stay with their son so they can come now to visit with Ms. Venn. I explained that we are not able to maintain blood pressure and I worry that she is rapidly approaching end of life.   I met with Dannielle Huh and Peggy at bedside as well as son, Octavia Bruckner (via FaceTime). I explained the above and concern that she is at end of life. Given her significant decline and poor prognosis I recommend comfort care at this time. They are appropriately sad and tearful but have good understanding and agree with comfort care. I expressed my condolences that she has declined so quickly and has had such trouble for the past few months. Emotional support provided.   Discussed with Dr. Wynetta Emery and RN Vida Roller. Orders changed to reflect comfort care. Will maintain dextrose infusion at this time to allow family time to come gather at bedside but will not increase. Adjust medications as needed to achieve comfort.   Vinie Sill, NP Palliative Medicine Team Pager [phone] (Please see [email or web address] for schedule) Team Phone [phone]

## 2019-11-18 NOTE — Progress Notes (Signed)
Physical Therapy Treatment Patient Details Name: [name] MRN: [id number] DOB: 01-Jun-1950 Today's Date: 11/18/2019    History of Present Illness [name] is a 69 y.o. female with medical history significant for COPD asthma with chronic respiratory failure on 3 L, paroxysmal atrial fibrillation, HOCM,liver cirrhosis, diabetes mellitus.Patient was sent to the ED via EMS from her endocrinologist's office with blood sugar of 32, she was given D50 and blood sugar increased to 47, but on arrival to the ED, her blood sugar was 20.Over the past 3 months she has been vomiting every day, at least 2-3 times a day, she has been vomiting almost every thing she tries to eat, with resulted in weight loss.  She reports chronic intermittent abdominal pain.  But she has no abdominal pain today.  No loose stools. Patient is not on any medications for her diabetes.    PT Comments    Patient demonstrates slow labored movement for sitting up at bedside with fair/poor carryover for using BUE due to weakness, c/o dizziness upon sitting up with BP at 91/61, tolerated completing BLE ROM/strengthening exercises for a few minutes before requesting to lie down due discomfort and pain over buttocks.  Patient 's BP at 99/69, but unable to attempt sit to stands secondary to patient c/o fatigue and request to lie down and low BP.  Patient put back to bed with Mod assist to reposition.  Patient will benefit from continued physical therapy in hospital and recommended venue below to increase strength, balance, endurance for safe ADLs and gait.   Follow Up Recommendations  SNF     Equipment Recommendations  None recommended by PT    Recommendations for Other Services       Precautions / Restrictions Precautions Precautions: Fall Restrictions Weight Bearing Restrictions: No    Mobility  Bed Mobility Overal bed mobility: Needs Assistance Bed Mobility: Rolling;Sit to Supine;Supine to Sit Rolling: Min  assist   Supine to sit: Mod assist Sit to supine: Mod assist   General bed mobility comments: slow labored movement with diffiuclty useing BUE secondary to weakness  Transfers                    Ambulation/Gait                 Stairs             Wheelchair Mobility    Modified Rankin (Stroke Patients Only)       Balance Overall balance assessment: Needs assistance Sitting-balance support: Feet supported;No upper extremity supported Sitting balance-Leahy Scale: Fair Sitting balance - Comments: fair/good seated EOB                                    Cognition Arousal/Alertness: Awake/alert Behavior During Therapy: WFL for tasks assessed/performed Overall Cognitive Status: Within Functional Limits for tasks assessed                                        Exercises General Exercises - Lower Extremity Long Arc Quad: Seated;AROM;Strengthening;Both;10 reps Hip Flexion/Marching: Seated;AROM;Strengthening;Both;10 reps Heel Raises: Seated;AROM;Strengthening;Both;10 reps    General Comments        Pertinent Vitals/Pain Pain Assessment: Faces Faces Pain Scale: Hurts even more Pain Location: over buttocks when sitting Pain Descriptors / Indicators: Grimacing;Sore;Discomfort Pain Intervention(s): Limited activity  within patient's tolerance;Monitored during session;Repositioned    Home Living                      Prior Function            PT Goals (current goals can now be found in the care plan section) Acute Rehab PT Goals Patient Stated Goal: Go home PT Goal Formulation: With patient Time For Goal Achievement: 11/28/19 Potential to Achieve Goals: Fair Progress towards PT goals: Progressing toward goals    Frequency    Min 3X/week      PT Plan Current plan remains appropriate    Co-evaluation              AM-PAC PT "6 Clicks" Mobility   Outcome Measure  Help needed turning from your  back to your side while in a flat bed without using bedrails?: A Lot Help needed moving from lying on your back to sitting on the side of a flat bed without using bedrails?: A Lot Help needed moving to and from a bed to a chair (including a wheelchair)?: A Lot Help needed standing up from a chair using your arms (e.g., wheelchair or bedside chair)?: A Lot Help needed to walk in hospital room?: A Lot Help needed climbing 3-5 steps with a railing? : Total 6 Click Score: 11    End of Session Equipment Utilized During Treatment: Oxygen Activity Tolerance: Patient tolerated treatment well;Patient limited by fatigue Patient left: with call bell/phone within reach;in bed;with family/visitor present Nurse Communication: Mobility status PT Visit Diagnosis: Unsteadiness on feet (R26.81);Other abnormalities of gait and mobility (R26.89);Muscle weakness (generalized) (M62.81)     Time: 1350-1416 PT Time Calculation (min) (ACUTE ONLY): 26 min  Charges:  $Therapeutic Exercise: 8-22 mins $Therapeutic Activity: 8-22 mins                     2:27 PM, 11/18/19 Lonell Grandchild, MPT Physical Therapist with [facility] [phone] office [phone] mobile phone

## 2019-11-18 NOTE — Care Management Important Message (Signed)
Important Message  Patient Details  Name: [name] MRN: [id number] Date of Birth: Sep 25, 1950   Medicare Important Message Given:  Yes     Tommy Medal 11/18/2019, 4:12 PM

## 2019-11-18 NOTE — TOC Progression Note (Signed)
Transition of Care [facility]) - Progression Note   Patient Details  Name: [name] MRN: [id number] Date of Birth: [birth year]  Transition of Care [facility]) CM/SW [place], LCSW Phone Number: 11/18/2019, 4:06 PM  Clinical Narrative: CSW spoke with palliative NP to discuss patient going to SNF vs. hospice. Per NP, the patient is agreeable to hospice, but not at a hospice facility, and the referral was prematurely made to [facility] earlier this week. NP reported she will be discussing goals of care with the family today due to patient's decline, however, the patient's son is out of the country until 11/20/19. NP also reported the patient would likely not be medically stable enough to transfer to a hospice facility anyway and a hospital death is anticipated.  CSW spoke with Horris Latino, the after hours RN for [facility]. Per Horris Latino, the staff that does GIP is only available Monday-Friday and have left for the day. NP notified CSW patient is now comfort care.  Expected Discharge Plan: [place] Barriers to Discharge: Continued Medical Work up  Expected Discharge Plan and Services Expected Discharge Plan: [place] arrangements for the past 2 months: Apartment  Readmission Risk Interventions No flowsheet data found.

## 2019-11-18 NOTE — Progress Notes (Signed)
Palliative:  HPI:68 y.o.femalewith past medical history of COPD on 3L, OSA, diastolic heart failure, hypertrophic obstructive cardiomyopathy, HTN, HLD, paroxysmal atrial fibrillation, scleroderma, liver cirrhosis, diabetesadmitted on 6/22/2021from endocrinologist office with hypoglycemia.Chronic vomiting over past ~3 months with weight loss and occasional abdominal pain. Ongoing hypoglycemia related to severe malnutrition and depleted glycogen stores per Dr. Dorris Fetch endocrinologist. Also with severe reflux and gastroparesis now on Reglan. Also concern for bronchitis.Now requiring D10% infusion.    I went by to visit with [name] but she is fast asleep and resting comfortably so I did not awaken her. I discussed with RN who reports that she has tolerated her medications without emesis and some intake this morning. Blood sugars remain stable on dextrose infusion. No further concerns.   I called and spoke with daughter-in-law, [name], per son Kelvin's request. [name] and [name] visited with [name] last night and [name] shares that [name] was very tired and fatigued. I tell her that [name] appears very fatigued even throughout the day and that she will arouse and speak with me and attempt to eat/drink but her eyes remain heavy. I explain that this is part of her expected decline with severe malnutrition. [name] also reports that [name] seemed open to going to nursing facility with hospice if this is an option (specifically named [name]). I explained that this will be good to look into.   I further discussed that we first need to figure out what to do about the dextrose infusion. I explained that we would like to slowly titrate this to off but in this process we need to have a plan if her blood sugar drops. I expressed my concern that the dextrose infusion can be like a life support in these situations and that we should consider if she fails to successfully come off  dextrose infusion we should consider offering comfort care instead of increasing infusion again. [name] understands. [name] will also work with [name] to contact [name] other son, [name], who is currently out of country and returning Sunday. We discussed that if he wishes to visit with [name] I would recommend he do so soon upon his return. I explain that there is the possibility that [name] could decline even on dextrose infusion as she is incredibly frail. [name] understands and she and [name] will update me when they learn of [name] wishes and plan for visitation so we can make a plan for management and titration of dextrose infusion accordingly. If she does well coming off dextrose infusion we can look into facility placement with hospice assistance.   Exam: Cachectic. Sleeping. Breathing regular, unlabored. Abd flat.   Plan: - Family in discussions regarding plans for visitation (son arrives back in [place] Sunday). This will impact our plan for titration of dextrose infusion.  - If able to successfully come off dextrose infusion will plan for facility placement with hospice to support IF this is an option.   [place], NP Palliative Medicine Team Pager [phone] (Please see [email or web address] for schedule) Team Phone [phone]    Greater than 50%  of this time was spent counseling and coordinating care related to the above assessment and plan

## 2019-11-18 NOTE — Progress Notes (Signed)
   11/17/19 2022  Assess: MEWS Score  Temp 98.2 F (36.8 C)  BP (!) 80/55  Pulse Rate 85  Resp 16  SpO2 100 %  Assess: MEWS Score  MEWS Temp 0  MEWS Systolic 2  MEWS Pulse 0  MEWS RR 0  MEWS LOC 0  MEWS Score 2  MEWS Score Color Yellow  Assess: if the MEWS score is Yellow or Red  Were vital signs taken at a resting state? Yes  Focused Assessment Documented focused assessment  Early Detection of Sepsis Score *See Row Information* Low  MEWS guidelines implemented *See Row Information* Yes  Treat  MEWS Interventions Escalated (See documentation below)  Take Vital Signs  Increase Vital Sign Frequency  Yellow: Q 2hr X 2 then Q 4hr X 2, if remains yellow, continue Q 4hrs  Notify: Provider  Provider Name/Title M. Sharlet Salina  Date Provider Notified 11/17/19  Time Provider Notified 2030  Notification Type Page  Notification Reason Change in status  Response See new orders  Date of Provider Response 11/17/19  Time of Provider Response 2045   Notified on call MD, M. Denny of low BP, pt alert. New order for 500cc NS bolus and to check BS. BS 123. BP 100/63 after bolus.

## 2019-11-18 NOTE — Progress Notes (Signed)
PROGRESS NOTE  [name] UXL:[id number] DOB: 1950-06-21 DOA: 11/01/2019 PCP: Maryruth Hancock, MD  HPI/Recap of past 24 hours: 69 y.o.femalewith medical history significant forCOPD asthma with chronic respiratory failure on 3 L, paroxysmal atrial fibrillation,HOCM,liver cirrhosis, diabetes mellitus. Patient was sent to the ED via EMS from her endocrinologist'soffice with blood sugar of 32,she was given D50 and blood sugar increased to 47, but on arrival to the ED, her blood sugar was 20.Patient has hadprofound weight loss of greater than 150 pounds in the past 2 years (weight stable last 6 months), dysphagia to solids/pills, recent diagnosis of scleroderma.  Over the past 3 months she has been vomiting every day, at least 2-3 times a day, she has been vomiting almostevery thing shetries to eat,withresulted in weight loss. She reports chronic intermittent abdominal pain. But she has no abdominal pain this admission. No loose stools. Patient is not on any medications for her diabetes.  She has chronic lower extremity swelling that that fluctuates but is currently unchanged,no chest pain, no difficulty breathing.  She continued to having vomiting during this admission despite conservative measures.  IR did not feel her anatomy was conducive for PEG.  General surgery has been consulted to place open gastrostomy tube.  Palliative medicine was consulted to discuss [place].  11/18/19: Seen and examined at her bedside.  Pt has taken a turn for the worse. She is somnolent, not eating or drinking and now hypotensive requiring fluid boluses.  I have met with palliative care team and I told them that it is time to initiate full comfort care as patient appears to be actively dying.      Assessment/Plan: Principal Problem:   Hypoglycemia Active Problems:   Type 2 diabetes mellitus without complication, without long-term current use of insulin (HCC)   Essential hypertension   Asthma,  chronic   Chronic respiratory failure with hypoxia (HCC)   ILD (interstitial lung disease) (HCC)   Hypothyroidism   Hepatic cirrhosis (HCC)   Paroxysmal atrial fibrillation (HCC)   Loss of weight   Abnormal CT scan   Intractable vomiting   Adult failure to thrive   Preoperative cardiovascular examination   Goals of care, counseling/discussion   Palliative care by specialist   Hypoglycemia/FTT/Intractable Nausea/vomiting -Dr. Roderic Palau discussed with endocrineDr. Dorris Fetch. Felt that hypoglycemia is related to severe malnutrition and depleted glycogen stores. Recommendations were to keep the patient on dextrose infusion while in the hospital. Also, keep gloves on patient's hands to help keep them warm. This may improve accuracy of CBG. He recommended holding off on further work-up for insulinoma in current setting since results may be skewed. This will be further followed up as an outpatient. Overall blood sugars are better with dextrose infusion.Her nausea has improved and she has been making efforts to eat on her own.  Declines PEG tube placement.  Improving, intractable nausea/vomiting -Associated with significant weight loss -related to severe reflux esophagitis and gastroparesis -05/19/19--EGD--severe reflux esophagitis -GES--confirms gastroparesis -reglan increased to 10 mg tid -GI following and assisting with further management -Declines PEG tube placement. -Continue to encourage increase in oral protein calorie intake  Nonsustained asymptomatic V. Tach non sustained and asymptomatic  Hypertrophic CM -appreciate cardiology consult-->From a purely cardiac standpoint there is no contraindication to her surgery, defer considerations regarding her noncardiac comorbidities to primary team. No additional cardiac testing planned at this time.  Hypoglycemia secondary to glycogen stores depletion -11/01/19 A1c--5.0 -not on any hypoglycemics -hypoglycemic epidoses improved on IV  dextrose infusion -ADDED  ORAL GLUCOSE GEL however patient can't tolerate taking them   Dilated SB loops -likely ileus, not clinically obstructed -6/29 CT abd/pelv--dilated fluid filled SB loops, many lie anterior to stomach. Bibasilar bronchiectasis -passing flatus; had small BM 11/13/2019  Chronic respiratory failure with hypoxia -On 3 L nasal cannula at baseline -Maintain O2 saturation greater than 90% -Continue bronchodilators as needed  Pancreatic lesion: -MRCP this admission markedly motion degraded. Will need CT abd with pancreatic protocol in May 2022 as she did not have a good quality study with MRI in setting of motion degradation.  Essential HTN -Blood pressure is at goal -continue coreg and HTN  Hypothyroidism -continue home dose synthroid -TSH 10.589 -Repeat TSH in 6 weeks when acute illness has resolved  Scleroderma -on chronic prednisone  Oral thrush Reports burning in her mouth Satellite lesions below her tongue Conitnue Magic mouthwash with lidocaine  Hepatic steatosis/Anasarca -check urine protein/creatiine ratio--0.12 -continue IV furosemide 40 bid -likely due to hypoalbuminemia -Net INO +11.9 L -Need strict I's and O's and daily weight for accuracy  Hypokalemia, likely from diuretic Serum potassium 3.3 Repleted intravenously Add oral potassium while on Lasix Serum magnesium 1.9  Resolved post repletion: Hypomagnesemia Serum magnesium 1.3> 1.9 Repleted with 4 g IV magnesium once  Goals of Care -patient is DNR -Palliative care following and assisting with establishing goals of care I asked family to call family together as I believe that it is time to start full comfort care  Status is: Inpatient  Anticipate transition to full comfort care over next few hours after family arrives.  Anticipate hospital death if no residential hospice bed immediately available.   Dispo: The patient is from:Home Anticipated d/c is  WP:[id number] vs hospital death Anticipated d/c date is: 11/18/2019. Patient currently is not medically stable to d/c unless for hospice/full comfort care  Family Communication:updated family members and in close contact with palliative team   Consultants:GI, general surgery, cardiology; palliative  Code Status: DNR  DVT Prophylaxis: [place] Heparin 3 times daily.   Procedures: As Listed in Progress Note Above  Antibiotics: None   Objective: Vitals:   11/18/19 0434 11/18/19 0500 11/18/19 0813 11/18/19 1319  BP: (!) 83/54   (!) 82/56  Pulse: 93   (!) 108  Resp: 16   19  Temp: 98.9 F (37.2 C)   (!) 100.7 F (38.2 C)  TempSrc: Oral   Oral  SpO2:   97%   Weight:  72.1 kg    Height:        Intake/Output Summary (Last 24 hours) at 11/18/2019 1425 Last data filed at 11/18/2019 1332 Gross per 24 hour  Intake 1409.19 ml  Output 1300 ml  Net 109.19 ml   Filed Weights   11/16/19 0500 11/17/19 0500 11/18/19 0500  Weight: 71 kg 70.2 kg 72.1 kg   Exam:  . General: 69 y.o. year-old female frail and somnolent, Pt appears emaciated and chronically ill.   . Cardiovascular: Regular rate and rhythm no rubs or gallops.   Marland Kitchen Respiratory: BBS Clear to auscultation no wheezes or rales.  .  Abdomen: Soft nontender normal bowel sounds present.   . Musculoskeletal: No lower extremity edema bilaterally.    Marland Kitchen Psychiatry: flat affect.   Data Reviewed: CBC: Recent Labs  Lab 11/12/19 0513 11/15/19 0615 11/16/19 0506 11/17/19 0439  WBC 6.7 8.9 9.1 11.1*  HGB 8.3* 9.2* 8.1* 8.3*  HCT 24.9* 27.7* 23.3* 23.9*  MCV 85.9 87.4 84.7 85.1  PLT 259 279 263 277  Basic Metabolic Panel: Recent Labs  Lab 11/14/19 0508 11/15/19 0615 11/16/19 0506 11/17/19 0439 11/18/19 0408  NA 139 138 138 137 137  K 3.4* 3.3* 3.3* 3.1* 3.2*  CL 107 106 107 106 106  CO2 21* '23 24 23 23  '$ GLUCOSE 141* 81 138* 175* 113*  BUN '15 15 15 16 18  '$ CREATININE 0.93 0.82 0.89 0.93  1.09*  CALCIUM 8.0* 7.6* 7.6* 7.7* 7.3*  MG 1.3* 1.9 1.7 1.7 2.1  PHOS  --  2.6 2.3*  --   --    GFR: Estimated Creatinine Clearance: 48.4 mL/min (A) (by C-G formula based on SCr of 1.09 mg/dL (H)). Liver Function Tests: Recent Labs  Lab 11/12/19 0513 11/14/19 0508  AST 10* 11*  ALT 8 9  ALKPHOS 78 80  BILITOT 0.8 0.7  PROT 5.4* 5.7*  ALBUMIN 1.7* 1.8*   No results for input(s): LIPASE, AMYLASE in the last 168 hours. No results for input(s): AMMONIA in the last 168 hours. Coagulation Profile: No results for input(s): INR, PROTIME in the last 168 hours. Cardiac Enzymes: No results for input(s): CKTOTAL, CKMB, CKMBINDEX, TROPONINI in the last 168 hours. BNP (last 3 results) No results for input(s): PROBNP in the last 8760 hours. HbA1C: No results for input(s): HGBA1C in the last 72 hours. CBG: Recent Labs  Lab 11/17/19 1110 11/17/19 1644 11/17/19 2054 11/18/19 0817 11/18/19 1102  GLUCAP 106* 131* 122* 121* 94   Lipid Profile: No results for input(s): CHOL, HDL, LDLCALC, TRIG, CHOLHDL, LDLDIRECT in the last 72 hours. Thyroid Function Tests: No results for input(s): TSH, T4TOTAL, FREET4, T3FREE, THYROIDAB in the last 72 hours. Anemia Panel: No results for input(s): VITAMINB12, FOLATE, FERRITIN, TIBC, IRON, RETICCTPCT in the last 72 hours. Urine analysis:    Component Value Date/Time   COLORURINE YELLOW 08/17/2019 [place] 08/17/2019 1134   LABSPEC 1.006 08/17/2019 1134   PHURINE 7.0 08/17/2019 1134   GLUCOSEU NEGATIVE 08/17/2019 1134   GLUCOSEU NEGATIVE 11/30/2013 1057   HGBUR NEGATIVE 08/17/2019 1134   BILIRUBINUR NEGATIVE 12/09/2018 0923   KETONESUR NEGATIVE 08/17/2019 1134   PROTEINUR NEGATIVE 08/17/2019 1134   UROBILINOGEN 0.2 11/30/2013 1057   NITRITE NEGATIVE 08/17/2019 1134   LEUKOCYTESUR NEGATIVE 08/17/2019 1134    Recent Results (from the past 240 hour(s))  Culture, Urine     Status: Abnormal   Collection Time: 11/10/19  8:06 PM     Specimen: Urine, Random  Result Value Ref Range Status   Specimen Description   Final    URINE, RANDOM Performed at [facility], [address] [place], [place] [address]    Special Requests   Final    NONE Performed at [facility], [address], [place],  [address]    Culture MULTIPLE SPECIES PRESENT, SUGGEST RECOLLECTION (A)  Final   Report Status 11/12/2019 FINAL  Final   Studies: No results found.  Scheduled Meds: . aspirin EC  81 mg Oral Daily  . dextrose  1 Tube Oral BID  . feeding supplement  1 Container Oral TID BM  . folic acid  1 mg Oral Daily  . furosemide  40 mg Intravenous BID  . heparin  5,000 Units Subcutaneous Q8H  . latanoprost  1 drop Both Eyes QHS  . levothyroxine  88 mcg Oral QAC breakfast  . metoCLOPramide (REGLAN) injection  10 mg Intravenous TID AC  . mometasone-formoterol  2 puff Inhalation BID  . multivitamin with minerals  1 tablet Oral Daily  .  pantoprazole (PROTONIX) IV  40 mg Intravenous Q12H  . potassium chloride  40 mEq Oral BID  . predniSONE  20 mg Oral Daily  . vitamin B-12  500 mcg Oral Daily   Continuous Infusions: . dextrose 50 mL/hr at 11/18/19 1201    LOS: 16 days   Irwin Brakeman, MD [facility] How to contact the [facility] Attending or Consulting provider [place] or covering provider during after hours [place], for this patient?  1. Check the care team in [facility] and look for a) attending/consulting TRH provider listed and b) the [facility] team listed 2. Log into [email or web address] and use [place]'s universal password to access. If you do not have the password, please contact the hospital operator. 3. Locate the [facility] provider you are looking for under [facility] and page to a number that you can be directly reached. 4. If you still have difficulty reaching the provider, please page the [facility] (Director on Call) for the Hospitalists listed on amion for assistance.  If 7PM-7AM, please contact  night-coverage [email or web address] Password [email or web address] 11/18/2019, 2:25 PM

## 2019-11-18 NOTE — Progress Notes (Signed)
Patient made comfort care dulera has been dc

## 2019-11-18 NOTE — Progress Notes (Signed)
Reviewed chart, no significant changes. Patient being seen by palliative care. Working on goals of care, possible home hospice or comfort care. Spoke with Dr. Wynetta Emery as well. We will plan to sign off as we do not have anything additional to offer from GI perspective given declining feeding tube.   Thank you for allowing [place] to participate in the care of Melissa Hatchet, DNP, AGNP-C Adult & Gerontological Nurse Practitioner [facility]

## 2019-11-19 DIAGNOSIS — Z515 Encounter for palliative care: Secondary | ICD-10-CM

## 2019-11-19 MED ORDER — PHENOL 1.4 % MT LIQD: 1.0000 | OROMUCOSAL | Status: DC | PRN

## 2019-11-19 MED ORDER — CHLORHEXIDINE GLUCONATE CLOTH 2 % EX PADS
6.0000 | MEDICATED_PAD | Freq: Every day | CUTANEOUS | Status: DC
  Administered 2019-11-19: 6 via TOPICAL

## 2019-11-19 NOTE — Progress Notes (Signed)
11/19/2019 11:43 AM  I had several discussions yesterday with the palliative medicine team and also spoke with patient's daughter-in-law [name].  Patient has been made full comfort care and we are anticipating hospital death.  Patient had adamantly refused residential hospice.  Patient adamantly refused PEG tube placement or any types of feeding tube or tubes of any kind.  Comfort care orders are in place.  Visitation per proper care protocol ordered.  Please see comfort care orders placed by the palliative medicine team.  Patient was seen and evaluated at the bedside with the nurse today.  She appears comfortable in no distress.  She is localizing her needs.  She complains of dry mouth we have ordered sponge swabs as needed.  Chloraseptic ordered as needed for sore throat symptoms.  Continue comfort care orders.  Anticipating hospital death.  Chaplain consulted.  We have been unable to GIP patient as we were told that the staff that comes to assess for GIP are only available M-F.     Vitals:   11/18/19 1530 11/18/19 2035  BP: (!) 79/51 (!) 83/60  Pulse: 86 (!) 107  Resp: 20 16  Temp: 97.6 F (36.4 C) 98.3 F (36.8 C)  SpO2: 100%     [place] Natal MD  How to contact the [facility] Attending or Consulting provider [place] or covering provider during after hours [place], for this patient?  1. Check the care team in [facility] and look for a) attending/consulting TRH provider listed and b) the [facility] team listed 2. Log into [email or web address] and use [place]'s universal password to access. If you do not have the password, please contact the hospital operator. 3. Locate the [facility] provider you are looking for under [facility] and page to a number that you can be directly reached. 4. If you still have difficulty reaching the provider, please page the [facility] (Director on Call) for the Hospitalists listed on amion for assistance.

## 2019-11-20 NOTE — Progress Notes (Signed)
11/20/2019 10:55 AM  I met with patient, her son and daughter in law were visiting.  She identified no needs today. I asked care team to do more mouth care and dental care today.  She denies pain and discomfort. Full comfort care orders in place.    Vitals:   11/19/19 1322 11/20/19 0619  BP: (!) 86/72 (!) 86/66  Pulse: 97 96  Resp: 16 18  Temp: 98 F (36.7 C) 99 F (37.2 C)  SpO2: 90% (!) 89%   [place] Natal MD How to contact the [facility] Attending or Consulting provider [place] or covering provider during after hours [place], for this patient?  1. Check the care team in [facility] and look for a) attending/consulting TRH provider listed and b) the [facility] team listed 2. Log into [email or web address] and use [place]'s universal password to access. If you do not have the password, please contact the hospital operator. 3. Locate the [facility] provider you are looking for under [facility] and page to a number that you can be directly reached. 4. If you still have difficulty reaching the provider, please page the [facility] (Director on Call) for the Hospitalists listed on amion for assistance.

## 2019-11-21 ENCOUNTER — Inpatient Hospital Stay (HOSPITAL_COMMUNITY)
Admission: RE | Admit: 2019-11-21 | Discharge: 2019-12-11 | DRG: 951 | Disposition: E | Source: Hospice | Attending: Internal Medicine | Admitting: Internal Medicine

## 2019-11-21 DIAGNOSIS — I5032 Chronic diastolic (congestive) heart failure: Secondary | ICD-10-CM | POA: Diagnosis present

## 2019-11-21 DIAGNOSIS — M349 Systemic sclerosis, unspecified: Secondary | ICD-10-CM | POA: Diagnosis present

## 2019-11-21 DIAGNOSIS — R54 Age-related physical debility: Secondary | ICD-10-CM | POA: Diagnosis present

## 2019-11-21 DIAGNOSIS — E039 Hypothyroidism, unspecified: Secondary | ICD-10-CM | POA: Diagnosis present

## 2019-11-21 DIAGNOSIS — Z7952 Long term (current) use of systemic steroids: Secondary | ICD-10-CM

## 2019-11-21 DIAGNOSIS — Z87891 Personal history of nicotine dependence: Secondary | ICD-10-CM

## 2019-11-21 DIAGNOSIS — I472 Ventricular tachycardia: Secondary | ICD-10-CM | POA: Diagnosis not present

## 2019-11-21 DIAGNOSIS — Z823 Family history of stroke: Secondary | ICD-10-CM

## 2019-11-21 DIAGNOSIS — K567 Ileus, unspecified: Secondary | ICD-10-CM | POA: Diagnosis not present

## 2019-11-21 DIAGNOSIS — H409 Unspecified glaucoma: Secondary | ICD-10-CM | POA: Diagnosis present

## 2019-11-21 DIAGNOSIS — R627 Adult failure to thrive: Secondary | ICD-10-CM | POA: Diagnosis present

## 2019-11-21 DIAGNOSIS — Z66 Do not resuscitate: Secondary | ICD-10-CM | POA: Diagnosis present

## 2019-11-21 DIAGNOSIS — R1310 Dysphagia, unspecified: Secondary | ICD-10-CM | POA: Diagnosis present

## 2019-11-21 DIAGNOSIS — E11649 Type 2 diabetes mellitus with hypoglycemia without coma: Secondary | ICD-10-CM | POA: Diagnosis present

## 2019-11-21 DIAGNOSIS — I480 Paroxysmal atrial fibrillation: Secondary | ICD-10-CM | POA: Diagnosis present

## 2019-11-21 DIAGNOSIS — K2100 Gastro-esophageal reflux disease with esophagitis, without bleeding: Secondary | ICD-10-CM | POA: Diagnosis present

## 2019-11-21 DIAGNOSIS — K760 Fatty (change of) liver, not elsewhere classified: Secondary | ICD-10-CM | POA: Diagnosis present

## 2019-11-21 DIAGNOSIS — J9611 Chronic respiratory failure with hypoxia: Secondary | ICD-10-CM | POA: Diagnosis present

## 2019-11-21 DIAGNOSIS — E43 Unspecified severe protein-calorie malnutrition: Secondary | ICD-10-CM | POA: Diagnosis present

## 2019-11-21 DIAGNOSIS — R9389 Abnormal findings on diagnostic imaging of other specified body structures: Secondary | ICD-10-CM | POA: Diagnosis not present

## 2019-11-21 DIAGNOSIS — G4733 Obstructive sleep apnea (adult) (pediatric): Secondary | ICD-10-CM | POA: Diagnosis present

## 2019-11-21 DIAGNOSIS — Z8249 Family history of ischemic heart disease and other diseases of the circulatory system: Secondary | ICD-10-CM

## 2019-11-21 DIAGNOSIS — R1110 Vomiting, unspecified: Secondary | ICD-10-CM | POA: Diagnosis present

## 2019-11-21 DIAGNOSIS — I110 Hypertensive heart disease with heart failure: Secondary | ICD-10-CM | POA: Diagnosis present

## 2019-11-21 DIAGNOSIS — K7460 Unspecified cirrhosis of liver: Secondary | ICD-10-CM | POA: Diagnosis present

## 2019-11-21 DIAGNOSIS — G9340 Encephalopathy, unspecified: Secondary | ICD-10-CM | POA: Diagnosis not present

## 2019-11-21 DIAGNOSIS — J4530 Mild persistent asthma, uncomplicated: Secondary | ICD-10-CM | POA: Diagnosis not present

## 2019-11-21 DIAGNOSIS — Z825 Family history of asthma and other chronic lower respiratory diseases: Secondary | ICD-10-CM

## 2019-11-21 DIAGNOSIS — E1143 Type 2 diabetes mellitus with diabetic autonomic (poly)neuropathy: Secondary | ICD-10-CM | POA: Diagnosis present

## 2019-11-21 DIAGNOSIS — R112 Nausea with vomiting, unspecified: Secondary | ICD-10-CM | POA: Diagnosis not present

## 2019-11-21 DIAGNOSIS — Z9981 Dependence on supplemental oxygen: Secondary | ICD-10-CM

## 2019-11-21 DIAGNOSIS — Z800 Family history of malignant neoplasm of digestive organs: Secondary | ICD-10-CM

## 2019-11-21 DIAGNOSIS — E876 Hypokalemia: Secondary | ICD-10-CM | POA: Diagnosis not present

## 2019-11-21 DIAGNOSIS — E162 Hypoglycemia, unspecified: Secondary | ICD-10-CM | POA: Diagnosis not present

## 2019-11-21 DIAGNOSIS — E785 Hyperlipidemia, unspecified: Secondary | ICD-10-CM | POA: Diagnosis present

## 2019-11-21 DIAGNOSIS — M1990 Unspecified osteoarthritis, unspecified site: Secondary | ICD-10-CM | POA: Diagnosis present

## 2019-11-21 DIAGNOSIS — Z515 Encounter for palliative care: Secondary | ICD-10-CM | POA: Diagnosis present

## 2019-11-21 DIAGNOSIS — I421 Obstructive hypertrophic cardiomyopathy: Secondary | ICD-10-CM | POA: Diagnosis present

## 2019-11-21 DIAGNOSIS — Z833 Family history of diabetes mellitus: Secondary | ICD-10-CM

## 2019-11-21 DIAGNOSIS — Z96653 Presence of artificial knee joint, bilateral: Secondary | ICD-10-CM | POA: Diagnosis present

## 2019-11-21 DIAGNOSIS — Z7951 Long term (current) use of inhaled steroids: Secondary | ICD-10-CM

## 2019-11-21 DIAGNOSIS — J479 Bronchiectasis, uncomplicated: Secondary | ICD-10-CM | POA: Diagnosis present

## 2019-11-21 DIAGNOSIS — Z79899 Other long term (current) drug therapy: Secondary | ICD-10-CM

## 2019-11-21 DIAGNOSIS — Z7982 Long term (current) use of aspirin: Secondary | ICD-10-CM

## 2019-11-21 MED ORDER — GLYCOPYRROLATE 1 MG PO TABS: 1.0000 mg | ORAL_TABLET | ORAL | Status: DC | PRN

## 2019-11-21 MED ORDER — ACETAMINOPHEN 325 MG PO TABS: 650.0000 mg | ORAL_TABLET | Freq: Four times a day (QID) | ORAL | Status: DC | PRN

## 2019-11-21 MED ORDER — BIOTENE DRY MOUTH MT LIQD: 15.0000 mL | OROMUCOSAL | Status: DC | PRN

## 2019-11-21 MED ORDER — POLYVINYL ALCOHOL 1.4 % OP SOLN: 1.0000 [drp] | Freq: Four times a day (QID) | OPHTHALMIC | Status: DC | PRN

## 2019-11-21 MED ORDER — ONDANSETRON 4 MG PO TBDP: 4.0000 mg | ORAL_TABLET | Freq: Four times a day (QID) | ORAL | Status: DC | PRN

## 2019-11-21 MED ORDER — GLYCOPYRROLATE 0.2 MG/ML IJ SOLN: 0.2000 mg | INTRAMUSCULAR | Status: DC | PRN

## 2019-11-21 MED ORDER — ONDANSETRON HCL 4 MG/2ML IJ SOLN: 4.0000 mg | Freq: Four times a day (QID) | INTRAMUSCULAR | Status: DC | PRN

## 2019-11-21 MED ORDER — ACETAMINOPHEN 650 MG RE SUPP: 650.0000 mg | Freq: Four times a day (QID) | RECTAL | Status: DC | PRN

## 2019-11-21 NOTE — Progress Notes (Signed)
11/26/2019 1:03 PM   The care team continues to focus on comfort care measures.  Patient appears comfortable and resting.  She is more lethargic today.  She has not been eating much.  She continues to deny any immediate needs.  Her family has been visiting with her.  I spoke with the case management team and we are looking into making patient GIP status.  This would need to be done through [facility].  I also updated palliative medicine team.  I do not believe patient is benefiting from the dextrose infusion spoke with palliative about discontinuing the infusion.  Vitals:   11/20/19 1300 12/03/2019 0638  BP: 92/68 100/71  Pulse: 92 95  Resp: 16 18  Temp: 98.3 F (36.8 C) 98.9 F (37.2 C)  SpO2: 90%     [place] Natal MD   How to contact the [facility] Attending or Consulting provider [place] or covering provider during after hours [place], for this patient?  1. Check the care team in [facility] and look for a) attending/consulting TRH provider listed and b) the [facility] team listed 2. Log into [email or web address] and use [place]'s universal password to access. If you do not have the password, please contact the hospital operator. 3. Locate the [facility] provider you are looking for under [facility] and page to a number that you can be directly reached. 4. If you still have difficulty reaching the provider, please page the [facility] (Director on Call) for the Hospitalists listed on amion for assistance.

## 2019-11-21 NOTE — Progress Notes (Signed)
Palliative:  HPI:69 y.o.femalewith past medical history of COPD on 3L, OSA, diastolic heart failure, hypertrophic obstructive cardiomyopathy, HTN, HLD, paroxysmal atrial fibrillation, scleroderma, liver cirrhosis, diabetesadmitted on 6/22/2021from endocrinologist office with hypoglycemia.Chronic vomiting over past ~3 months with weight loss and occasional abdominal pain. Ongoing hypoglycemia related to severe malnutrition and depleted glycogen stores per Dr. Dorris Fetch endocrinologist. Also with severe reflux and gastroparesis now on Reglan. Also concern for bronchitis.Now requiring D10% infusion.    I met again today at Ms. Mehta's bedside. She is very lethargic and attempts to say a few words but mumbling and unable to understand her. She is much more lethargic today and does not open her eyes. She grimaces briefly while being repositioned but then goes right back to sleep. Her discomfort is relieved by turning and repositioning at current time. Otherwise she appears to be sleeping peacefully.   I met with her son, Dannielle Huh, as well as wife [name] (via phone) and 2 other family members join [place]. We discussed her progression and that her blood pressure has been more stable since I last saw her on Friday (although her Coreg and Lasix were stopped at that time which likely helped). I explain my recommendation to stop dextrose infusion at this time. I feel this is a life support that is prolonging her life without quality as she is unable to awaken to have meaningful interactions at this stage. I also worry that without Lasix that she will become edematous and can have pulmonary edema at some stage with fluids and this is best to prevent as it is difficult to treat once this occurs. (I am already giving her scheduled Robinul to assist with secretions). I encouraged family to consider this recommendation and Dannielle Huh will speak with his brother, Octavia Bruckner. I will reach out to Tim in the morning to discuss further.   All  questions/concerns addressed. Emotional support provided. Discussed with Dr. Wynetta Emery.   Exam: Cachectic. Somnolent. Unable to open eyes. Breathing regular, a little labored (she has pulled off oxygen). Abd flat.   Plan: - Comfort care and now transitioning to GIP status.  - Recommended to family that dextrose infusion be stopped at this time. They are discussing as a family.   25 min  Vinie Sill, NP Palliative Medicine Team Pager [phone] (Please see [email or web address] for schedule) Team Phone [phone]    Greater than 50%  of this time was spent counseling and coordinating care related to the above assessment and plan

## 2019-11-21 NOTE — H&P (Signed)
History and Physical  [name] IRC:[id number] DOB: 06/25/1950 DOA: 12/05/2019  PCP: Maryruth Hancock, MD   Chief Complaint: full comfort care   HPI: [name] is a 69 y.o. female failure to thrive, COPD, chronic respiratory failure, paroxysmal atrial fibrillation, liver cirrhosis, diabetes mellitus, recurrent severe hypoglycemia secondary to poor glycogen reserve, abnormal weight loss has been admitted under GIP status for full comfort care.  Review of Systems: Unable to obtain due to condition.  Past Medical History:  Diagnosis Date  . Anterolisthesis    Grade 1, L4-5  . Bronchospasm 05/28/2012  . COPD (chronic obstructive pulmonary disease) ([place])   . DEGENERATIVE JOINT DISEASE 10/06/2006  . DEPRESSION 09/26/2008  . DIABETES MELLITUS 10/06/2006  . Diastolic heart failure ([place])    Echo 10/2018 LVEF 55-60% with impaired relaxation.  . Diverticulosis    [id number]  . GERD (gastroesophageal reflux disease) 07/25/2013  . Glaucoma   . HOCM (hypertrophic obstructive cardiomyopathy) ([place])   . HYPERLIPIDEMIA 01/11/2009  . HYPERTENSION 10/06/2006  . Hypokalemia   . Hypomagnesemia   . Hypothyroidism   . INSOMNIA 09/26/2008  . Internal hemorrhoids   . OBSTRUCTIVE SLEEP APNEA 06/23/2008   Severe OSA per sleep study 2010, Rx a CPAP  . Pain in joint, multiple sites 11/10/2006  . Pericardial effusion   . Pericarditis   . UNSPECIFIED ANEMIA 12/10/2009  . UTI (urinary tract infection) 11/2017   Past Surgical History:  Procedure Laterality Date  . BIOPSY  12/28/2017   Procedure: BIOPSY;  Surgeon: Rush Landmark Telford Nab., MD;  Location: [facility];  Service: Gastroenterology;;  . BIOPSY  05/19/2019   Procedure: BIOPSY;  Surgeon: Daneil Dolin, MD;  [facility];  Service: Endoscopy;;  gastric  . COLONOSCOPY  08/01/2011   Procedure: COLONOSCOPY;  Surgeon: Inda Castle, MD;  Location[facility];  Service: Endoscopy;  Laterality: N/A;. hyperplastic polyp removed,  hemorrhoids s/p banding, diverticulosis, next tcs 10 years.   . ESOPHAGOGASTRODUODENOSCOPY (EGD) WITH PROPOFOL N/A 12/28/2017   Procedure: ESOPHAGOGASTRODUODENOSCOPY (EGD) WITH PROPOFOL;  Surgeon: Rush Landmark Telford Nab., MD;  Location: [facility];  Service: Gastroenterology;  Laterality: N/A; h.pylori gastiris, tortuous esophagus with esophageal bx (no EOE), medium sized hiatal hernia  . ESOPHAGOGASTRODUODENOSCOPY (EGD) WITH PROPOFOL N/A 05/19/2019   Procedure: ESOPHAGOGASTRODUODENOSCOPY (EGD) WITH PROPOFOL; severe reflux esophagitis, medium size hiatal hernia, gastric erosions with biopsies positive for mild chronic gastritis and negative for H. pylori.  No dilation performed.  Marland Kitchen LEFT HEART CATH AND CORONARY ANGIOGRAPHY N/A 10/22/2017   Procedure: LEFT HEART CATH AND CORONARY ANGIOGRAPHY;  Surgeon: Jettie Booze, MD;  Location: [facility];  Service: Cardiovascular;  Laterality: N/A;  . 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 Right knee replacement  2005   Social History:  reports that she quit smoking about 24 years ago. Her smoking use included cigarettes. She has a 0.80 pack-year smoking history. She has never used smokeless tobacco. She reports previous alcohol use. She reports that she does not use drugs.  No Known Allergies  Family History  Problem Relation Age of Onset  . Asthma Mother   . Stroke Mother   . Diabetes Mother   . Diabetes Other        M, B, S  . Hypertension Sister        M, S,B  . Pancreatic cancer Brother   . Colon cancer Neg Hx   . Prostate cancer Neg Hx   . Breast cancer Neg Hx   .  Esophageal cancer Neg Hx   . Liver disease Neg Hx   . Rectal cancer Neg Hx   . Stomach cancer Neg Hx   . Inflammatory bowel disease Neg Hx     Prior to Admission medications   Medication Sig Start Date End Date Taking? Authorizing Provider  albuterol (VENTOLIN HFA) 108 (90 Base) MCG/ACT inhaler Inhale 2 puffs into the lungs every 4 (four) hours as needed for wheezing or  shortness of breath. 10/28/18   Colon Branch, MD  alum & mag hydroxide-simeth (MAALOX/MYLANTA) 200-200-20 MG/5ML suspension Take 30 mLs by mouth every 4 (four) hours as needed for indigestion. 12/11/18   Manuella Ghazi, Pratik D, DO  aspirin EC 81 MG tablet Take 1 tablet (81 mg total) by mouth daily. 08/13/18   Colon Branch, MD  carvedilol (COREG) 25 MG tablet Take 1 tablet (25 mg total) by mouth 2 (two) times daily. Patient taking differently: Take 25 mg by mouth in the morning and at bedtime.  12/24/18 11/01/19  Charlie Pitter, PA-C  Cholecalciferol 50 MCG (2000 UT) CAPS Take 1 capsule (2,000 Units total) by mouth daily with breakfast. 05/24/19   Nida, Marella Chimes, MD  colchicine 0.6 MG tablet Take 0.6 mg by mouth daily.    [provider]  dexlansoprazole (DEXILANT) 60 MG capsule Take 1 capsule (60 mg total) by mouth daily. 10/17/19   Erenest Rasher, PA-C  escitalopram (LEXAPRO) 20 MG tablet TAKE 1 TABLET BY MOUTH  DAILY Patient not taking: Reported on 11/01/2019 03/11/19   Maryruth Hancock, MD  Fluticasone-Salmeterol [facility] INHUB) 250-50 MCG/DOSE AEPB Inhale 1 puff into the lungs 2 (two) times daily. 10/28/18   [provider]  furosemide (LASIX) 20 MG tablet Take 1 tablet (20 mg total) by mouth daily. 06/28/19   Imogene Burn, PA-C  glucose blood (ACCU-CHEK GUIDE) test strip Use as instructed Patient not taking: Reported on 11/01/2019 11/01/19   Cassandria Anger, MD  HYDROcodone-acetaminophen (NORCO/VICODIN) 5-325 MG tablet Take 1 tablet by mouth every 6 (six) hours as needed. Patient not taking: Reported on 11/01/2019 10/02/19   Milton Ferguson, MD  latanoprost (XALATAN) 0.005 % ophthalmic solution Place 1 drop into both eyes at bedtime.    [provider]  levothyroxine (SYNTHROID) 88 MCG tablet Take 1 tablet (88 mcg total) by mouth daily before breakfast. 04/28/19   Nida, Marella Chimes, MD  metoCLOPramide (REGLAN) 5 MG tablet Take 5 mg by mouth 3 (three) times daily as needed for  nausea or vomiting.  10/29/19   [provider]  ondansetron (ZOFRAN ODT) 4 MG disintegrating tablet 4mg  ODT q4 hours prn nausea/vomit Patient not taking: Reported on 11/01/2019 10/02/19   Milton Ferguson, MD  ondansetron (ZOFRAN) 4 MG tablet Take 1 tablet (4 mg total) by mouth every 8 (eight) hours as needed for nausea or vomiting. Patient not taking: Reported on 11/01/2019 08/11/19   Erenest Rasher, PA-C  OXYGEN Inhale 2 L into the lungs daily as needed (shortness of breath).     [provider]  Pancrelipase, Lip-Prot-Amyl, (ZENPEP) 25000-79000 units CPEP Take 2 capsules (50,000 units) with breakfast lunch and dinner.  Take 1 capsule (25,000 units) with snacks up to twice daily. Patient taking differently: Take 1-2 capsules by mouth See admin instructions. Take 2 capsules (50,000 units) with breakfast lunch and dinner.  Take 1 capsule (25,000 units) with snacks up to twice daily. 10/17/19   Erenest Rasher, PA-C  potassium chloride (KLOR-CON 10) 10 MEQ tablet  Take 10 mEq by mouth daily.    [provider]  potassium chloride SA (KLOR-CON) 20 MEQ tablet Take 1 tablet (20 mEq total) by mouth 2 (two) times daily. Patient not taking: Reported on 11/01/2019 08/16/19   Maryruth Hancock, MD  predniSONE (DELTASONE) 5 MG tablet Take 5 mg by mouth daily. 07/04/19   [provider]  rosuvastatin (CRESTOR) 40 MG tablet TAKE 1 TABLET BY MOUTH IN  THE EVENING Patient taking differently: Take 40 mg by mouth at bedtime.  03/11/19   Corum, Rex Kras, MD  verapamil (CALAN SR) 120 MG CR tablet Take 1 tablet (120 mg total) by mouth daily. Patient not taking: Reported on 11/01/2019 09/08/19   Evans Lance, MD  verapamil (CALAN) 120 MG tablet Take 120 mg by mouth daily. 10/12/19   [provider]   Physical Exam: There were no vitals filed for this visit.   General exam: thin, frail, emaciated appearance.   Head, eyes and ENT: Nontraumatic and normocephalic. Dry mucus membranes.     Neck: Supple. No JVD, carotid bruit or thyromegaly.  Lymphatics: No lymphadenopathy.  Respiratory system: shallow breath sounds bilateral.   Cardiovascular system: S1 and S2 heard, RRR.   Gastrointestinal system: Abdomen is nondistended, soft and nontender.  Central nervous system: somnolent.   Extremities: no gross lesions.    Skin: no gross lesions.   Psychiatry: comfort care  Labs on Admission:  Basic Metabolic Panel: Recent Labs  Lab 11/15/19 0615 11/16/19 0506 11/17/19 0439 11/18/19 0408  NA 138 138 137 137  K 3.3* 3.3* 3.1* 3.2*  CL 106 107 106 106  CO2 23 24 23 23   GLUCOSE 81 138* 175* 113*  BUN 15 15 16 18   CREATININE 0.82 0.89 0.93 1.09*  CALCIUM 7.6* 7.6* 7.7* 7.3*  MG 1.9 1.7 1.7 2.1  PHOS 2.6 2.3*  --   --    Liver Function Tests: No results for input(s): AST, ALT, ALKPHOS, BILITOT, PROT, ALBUMIN in the last 168 hours. No results for input(s): LIPASE, AMYLASE in the last 168 hours. No results for input(s): AMMONIA in the last 168 hours. CBC: Recent Labs  Lab 11/15/19 0615 11/16/19 0506 11/17/19 0439  WBC 8.9 9.1 11.1*  HGB 9.2* 8.1* 8.3*  HCT 27.7* 23.3* 23.9*  MCV 87.4 84.7 85.1  PLT 279 263 277   Cardiac Enzymes: No results for input(s): CKTOTAL, CKMB, CKMBINDEX, TROPONINI in the last 168 hours.  BNP (last 3 results) No results for input(s): PROBNP in the last 8760 hours. CBG: Recent Labs  Lab 11/17/19 1644 11/17/19 2054 11/18/19 0817 11/18/19 1102 11/18/19 1555  GLUCAP 131* 122* 121* 94 91    Radiological Exams on Admission: No results found.   Assessment/Plan Principal Problem:   Comfort measures only status   40. 69 year old female admitted to inpatient hospice for failure to thrive, poor oral intake abnormal weight loss, recurrent hypoglycemia and depleted glycogen stores.  Patient had decided against PEG tube placement and feeding tube placement after multiple discussions in palliative medicine conferencing.  The  patient has elected for full comfort care.  Discussions with family noted by palliative care and full comfort care orders initiated.  Please see orders.   Time spent: 40 mins  Leora Platt Wynetta Emery, MD [facility] How to contact the [facility] Attending or Consulting provider [place] or covering provider during after hours [place], for this patient?  1. Check the care team in [facility] and look for a) attending/consulting TRH provider listed  and b) the TRH team listed 2. Log into [email or web address] and use [place]'s universal password to access. If you do not have the password, please contact the hospital operator. 3. Locate the TRH provider you are looking for under [facility] and [place] to a number that you can be directly reached. 4. If you still have difficulty reaching the provider, please [place] the DOC (Director on Call) for the Hospitalists listed on amion for assistance.  

## 2019-11-21 NOTE — Discharge Summary (Signed)
69 y.o.femalewith medical history significant forCOPD asthma with chronic respiratory failure on 3 L, paroxysmal atrial fibrillation,HOCM,liver cirrhosis, diabetes mellitus. Patient was sent to the ED via EMS from her endocrinologist'soffice with blood sugar of 32,she was given D50 and blood sugar increased to 47, but on arrival to the ED, her blood sugar was 20.Patient has hadprofound weight loss of greater than 150 pounds in the past 2 years (weight stable last 6 months), dysphagia to solids/pills, recent diagnosis of scleroderma.  Over the past 3 months she has been vomiting every day, at least 2-3 times a day, she has been vomiting almostevery thing shetries to eat,withresulted in weight loss. She reports chronic intermittent abdominal pain. But she has no abdominal painthis admission. No loose stools. Patient is not on any medications for her diabetes.  She has chronic lower extremity swelling that that fluctuates but is currently unchanged,no chest pain, no difficulty breathing.She continued to having vomiting during this admission despite conservative measures. IR did not feel her anatomy was conducive for PEG. General surgery has been consulted to place open gastrostomy tube. Palliative medicine was consulted to discuss [place].  After discussions with family patient has been made full comfort care.    Assessment/Plan: Principal Problem:   Hypoglycemia Active Problems:   Type 2 diabetes mellitus without complication, without long-term current use of insulin (HCC)   Essential hypertension   Asthma, chronic   Chronic respiratory failure with hypoxia (HCC)   ILD (interstitial lung disease) (HCC)   Hypothyroidism   Hepatic cirrhosis (HCC)   Paroxysmal atrial fibrillation (HCC)   Loss of weight   Abnormal CT scan   Intractable vomiting   Adult failure to thrive   Preoperative cardiovascular examination   Goals of care, counseling/discussion   Palliative care by  specialist   Hypoglycemia/FTT/Intractable Nausea/vomiting -Dr. Roderic Palau discussed with endocrineDr. Dorris Fetch. Felt that hypoglycemia is related to severe malnutrition and depleted glycogen stores. Recommendations were to keep the patient on dextrose infusion while in the hospital. Also, keep gloves on patient's hands to help keep them warm. This may improve accuracy of CBG. He recommended holding off on further work-up for insulinoma in current setting since results may be skewed. This will be further followed up as an outpatient. Overall blood sugars are better with dextrose infusion.Her nausea has improved and she has been making efforts to eat on her own.  Declines PEG tube placement.  Improving, intractable nausea/vomiting -Associated with significant weight loss -related to severe reflux esophagitis and gastroparesis -05/19/19--EGD--severe reflux esophagitis -GES--confirms gastroparesis -reglan increased to 10 mg tid -GI following and assisting with further management -Declines PEG tube placement. -Continue to encourage increase in oral protein calorie intake  Nonsustained asymptomatic V. Tach non sustained and asymptomatic  Hypertrophic CM -appreciate cardiology consult-->From a purely cardiac standpoint there is no contraindication to her surgery, defer considerations regarding her noncardiac comorbidities to primary team. No additional cardiac testing planned at this time.  Hypoglycemia secondary to glycogen stores depletion -11/01/19 A1c--5.0 -not on any hypoglycemics -hypoglycemic epidoses improved on IV dextrose infusion -ADDED ORAL GLUCOSE GEL however patient can't tolerate taking them   Dilated SB loops -likely ileus, not clinically obstructed -6/29 CT abd/pelv--dilated fluid filled SB loops, many lie anterior to stomach. Bibasilar bronchiectasis -passing flatus; had small BM 11/13/2019  Chronic respiratory failure with hypoxia -On 3 L nasal cannula at  baseline -Maintain O2 saturation greater than 90% -Continue bronchodilators as needed  Pancreatic lesion: -MRCP this admission markedly motion degraded. Will need CT abd with pancreatic  protocol in May 2022 as she did not have a good quality study with MRI in setting of motion degradation.  Essential HTN -Blood pressure is at goal -continue coreg and HTN  Hypothyroidism -continue home dose synthroid -TSH 10.589 -Repeat TSH in 6 weeks when acute illness has resolved  Scleroderma -on chronic prednisone  Oral thrush Reports burning in her mouth Satellite lesions below her tongue Conitnue Magic mouthwash with lidocaine  Hepatic steatosis/Anasarca -check urine protein/creatiine ratio--0.12 -continue IV furosemide 40 bid -likely due to hypoalbuminemia -Net INO +11.9 L -Need strict I's and O's and daily weight for accuracy  Hypokalemia, likely from diuretic Serum potassium 3.3 Repleted intravenously Add oral potassium while on Lasix Serum magnesium 1.9  Resolved post repletion: Hypomagnesemia Serum magnesium 1.3> 1.9 Repleted with 4 g IV magnesium once  Goals of Care -patient is DNR -Palliative care following and assisting with establishing goals of care I asked family to call family together as I believe that it is time to start full comfort care  Status is: Inpatient  Full comfort care

## 2019-11-21 NOTE — TOC Progression Note (Signed)
Transition of Care [facility]) - Progression Note    Patient Details  Name: [name] MRN: [id number] Date of Birth: 1951/01/10  Transition of [facility]) CM/SW Contact  Salome Arnt, [place] Phone Number: 12/05/2019, 4:09 PM  Clinical Narrative:  LCSW discussed pt with [facility]. Pt is full comfort care. Pt/family agreeable to GIP. Hospice RN has completed paperwork and okay to flip chart. MD notified. LCSW discussed with pt's daughter-in-law who is agreeable, but would like pt to complete paperwork if possible.           Expected Discharge Plan and Services                                                 Social Determinants of Health (SDOH) Interventions    Readmission Risk Interventions No flowsheet data found.

## 2019-11-22 MED ORDER — IPRATROPIUM-ALBUTEROL 0.5-2.5 (3) MG/3ML IN SOLN: 3.0000 mL | RESPIRATORY_TRACT | Status: DC | PRN

## 2019-11-22 MED ORDER — MORPHINE SULFATE (PF) 2 MG/ML IV SOLN
1.0000 mg | INTRAVENOUS | Status: DC | PRN
  Administered 2019-11-22 (×2): 1 mg via INTRAVENOUS
  Administered 2019-11-23 (×3): 2 mg via INTRAVENOUS
  Administered 2019-11-23: 1 mg via INTRAVENOUS
  Administered 2019-11-24: 2 mg via INTRAVENOUS
  Filled 2019-11-22 (×7): qty 1

## 2019-11-22 MED ORDER — ONDANSETRON HCL 4 MG/2ML IJ SOLN: 4.0000 mg | Freq: Four times a day (QID) | INTRAMUSCULAR | Status: DC | PRN

## 2019-11-22 MED ORDER — CHLORHEXIDINE GLUCONATE CLOTH 2 % EX PADS
6.0000 | MEDICATED_PAD | Freq: Every day | CUTANEOUS | Status: DC
  Administered 2019-11-22 – 2019-11-24 (×3): 6 via TOPICAL

## 2019-11-22 MED ORDER — ONDANSETRON HCL 4 MG PO TABS: 4.0000 mg | ORAL_TABLET | Freq: Four times a day (QID) | ORAL | Status: DC | PRN

## 2019-11-22 MED ORDER — PHENOL 1.4 % MT LIQD: 1.0000 | OROMUCOSAL | Status: DC | PRN

## 2019-11-22 MED ORDER — ACETAMINOPHEN 650 MG RE SUPP: 650.0000 mg | Freq: Four times a day (QID) | RECTAL | Status: DC | PRN

## 2019-11-22 MED ORDER — BIOTENE DRY MOUTH MT LIQD: 15.0000 mL | OROMUCOSAL | Status: DC | PRN

## 2019-11-22 MED ORDER — GLYCOPYRROLATE 0.2 MG/ML IJ SOLN
0.4000 mg | Freq: Four times a day (QID) | INTRAMUSCULAR | Status: DC
  Administered 2019-11-22 – 2019-11-24 (×12): 0.4 mg via INTRAVENOUS
  Filled 2019-11-22 (×11): qty 2

## 2019-11-22 MED ORDER — METOCLOPRAMIDE HCL 5 MG/ML IJ SOLN
10.0000 mg | Freq: Three times a day (TID) | INTRAMUSCULAR | Status: DC
  Administered 2019-11-22 – 2019-11-24 (×9): 10 mg via INTRAVENOUS
  Filled 2019-11-22 (×9): qty 2

## 2019-11-22 MED ORDER — ACETAMINOPHEN 325 MG PO TABS: 650.0000 mg | ORAL_TABLET | Freq: Four times a day (QID) | ORAL | Status: DC | PRN

## 2019-11-22 MED ORDER — LATANOPROST 0.005 % OP SOLN
1.0000 [drp] | Freq: Every day | OPHTHALMIC | Status: DC
  Administered 2019-11-22 – 2019-11-24 (×3): 1 [drp] via OPHTHALMIC
  Filled 2019-11-22: qty 2.5

## 2019-11-22 NOTE — Progress Notes (Signed)
Palliative:  HPI:68 y.o.femalewith past medical history of COPD on 3L, OSA, diastolic heart failure, hypertrophic obstructive cardiomyopathy, HTN, HLD, paroxysmal atrial fibrillation, scleroderma, liver cirrhosis, diabetesadmitted on 6/22/2021from endocrinologist office with hypoglycemia.Chronic vomiting over past ~3 months with weight loss and occasional abdominal pain. Ongoing hypoglycemia related to severe malnutrition and depleted glycogen stores per Dr. Dorris Fetch endocrinologist. Also with severe reflux and gastroparesis now on Reglan. Also concern for bronchitis.Requiring D10% infusion - now d/c and full comfort care.  [name] continues to be sleeping comfortably. I cleaned her mouth out and she made a grunt but no other response. She continues to rest comfortably without any signs of pain or distress.   I called and spoke with son, [name]. He has had time to discuss with his family. He tells me that they do agree with stopping dextrose infusion. He plans to come back to visit with her again tomorrow and is hopeful that she will live until he can see her again. We discuss that she is a little stubborn so I would not be surprised if she does! We discussed that even though she is unlikely to be able to speak with him that doesn't mean that she cannot hear and find comfort in his voice and presence. [name] expresses appreciation of the care from the providers and nursing his mother and family have received.   All questions/concerns addressed. Emotional support provided.   Exam: Somnolent. Breathing regular, unlabored. Copious oral secretions cleaned out. Abd soft. Generalized mild edema. Warm to touch.   Plan: - Full comfort care - GIP hospice status. - Anticipate hospital death.   15 min  Vinie Sill, NP Palliative Medicine Team Pager [phone] (Please see [email or web address] for schedule) Team Phone [phone]    Greater than 50%  of this time was spent counseling and coordinating care  related to the above assessment and plan

## 2019-11-22 NOTE — Care Management Important Message (Signed)
Important Message  Patient Details  Name: [name] MRN: [id number] Date of Birth: 18-Feb-1951   Medicare Important Message Given:  Yes     Tommy Medal 11/22/2019, 9:49 AM

## 2019-11-22 NOTE — Progress Notes (Signed)
11/22/2019 3:15 PM   Pt was examined at bedside and I met with hospice nurse and hospice social worker at bedside.  My full comfort care orders are being fully implemented and I am satisfied that patient is receiving the appropriate comfort care at this time.  I was able to conference with the palliative care team and I appreciate all the support and assistance.  Pt is no longer verbalizing and appears lethargic and not reponsive today.  Continue full comfort care orders.  For comfort I have asked for vitals only at family request.  Anticipating hospital death.   There were no vitals filed for this visit.   Irwin Brakeman MD  How to contact the [facility] Attending or Consulting provider [place] or covering provider during after hours [place], for this patient?  1. Check the care team in [facility] and look for a) attending/consulting TRH provider listed and b) the [facility] team listed 2. Log into [email or web address] and use [place]'s universal password to access. If you do not have the password, please contact the hospital operator. 3. Locate the [facility] provider you are looking for under [facility] and page to a number that you can be directly reached. 4. If you still have difficulty reaching the provider, please page the [facility] (Director on Call) for the Hospitalists listed on amion for assistance.

## 2019-11-23 ENCOUNTER — Ambulatory Visit (HOSPITAL_COMMUNITY): Admission: RE | Admit: 2019-11-23 | Payer: Medicare Other | Source: Ambulatory Visit

## 2019-11-23 NOTE — Progress Notes (Signed)
PROGRESS NOTE  [name] CHY:[id number] DOB: 04-11-1951 DOA: 11/14/2019 PCP: Maryruth Hancock, MD  Brief History:  69 y.o.femalewith medical history significant forCOPD asthma with chronic respiratory failure on 3 L, paroxysmal atrial fibrillation,HOCM,liver cirrhosis, diabetes mellitus. Patient was sent to the ED via EMS from her endocrinologist'soffice with blood sugar of 32,she was given D50 and blood sugar increased to 47, but on arrival to the ED, her blood sugar was 20.Patient has hadprofound weight loss of greater than 150 pounds in the past 2 years (weight stable last 6 months), dysphagia to solids/pills, recent diagnosis of scleroderma.  Over the past 3 months she has been vomiting every day, at least 2-3 times a day, she has been vomiting almostevery thing shetries to eat,withresulted in weight loss. She reports chronic intermittent abdominal pain. But she has no abdominal pain this admission. No loose stools. Patient is not on any medications for her diabetes.  She has chronic lower extremity swelling that that fluctuates but is currently unchanged,no chest pain, no difficulty breathing.  She continued to having vomiting during this admission despite conservative measures.  IR did not feel her anatomy was conducive for PEG.  General surgery has been consulted to place open gastrostomy tube.  Palliative medicine was consulted to discuss [place].  After numerous discussions with palliative medicine and general surgery, the patient and family ultimately declined to have gastrostomy tube placement.  Given her significant decline and poor prognosis palliative medicine recommended full comfort care at this time. The family are appropriately sad and tearful but have good understanding and agree with comfort care. She was transitioned to full comfort care on 11/18/19.  [facility].  Since there were no beds available, the patient was  transitioned to general inpatient hospice admission.  Palliative medicine recommended to stop dextrose infusion They felt this is a life support that is prolonging her life without quality as she is unable to awaken to have meaningful interactions at this stage.  Assessment/Plan: Hypoglycemia/FTT/Intractable Nausea/vomiting -Dr. Roderic Palau discussed with endocrineDr. Dorris Fetch. Felt that hypoglycemia is related to severe malnutrition and depleted glycogen stores. Recommendations were to keep the patient on dextrose infusion while in the hospital. Also, keep gloves on patient's hands to help keep them warm. This may improve accuracy of CBG. He recommended holding off on further work-up for insulinoma in current setting since results may be skewed. This will be further followed up as an outpatient. Overall blood sugars are better with dextrose infusion.Need to ensure that patient is getting adequate enteral nutrition prior to discontinuing dextrose infusion. -continue D5/0.9NS-->ultimately stopped as discussed above as patient was transitioned to full comfort care  Intractable nausea/vomiting -worsen last 3 months -related to severe reflux esophagitis and gastroparesis -05/19/19--EGD--severe reflux esophagitis -GES--confirms gastroparesis -reglan increased to 7.5 mg tid -GI following and assisting with further work-up -Dr. Laural Golden agrees with PEG/PEJ -IR consulted-->anatomy not conducive for PEC -pt continues to vomiting and having difficulty tolerating clear liquids; many times she is "spitting up" -consult general surgery--planning open G-tube7/7 -7/4--case discussed with Dr. Constance Haw -repeat CT abd/pelv--Diffuse small bowel distension more pronounced in the LEFT and central abdomen similar to the previous exam; diffuse anasarca -clinically not obstructed -7/3 CT chest--Signs of interstitial lung disease with bronchiectatic changes at lung bases and subpleural ground-glass and  reticulation -Her nausea has improved and she has been making efforts to eat on her own.   -ultimately Declines PEG tube placement.  Hypertrophic  CM -appreciate cardiology consult-->From a purely cardiac standpoint there is no contraindication to her surgery, defer considerations regarding her noncardiac comorbidities to primary team. No additional cardaic testing planned at this time. -now full comfort care  Nonsustained asymptomatic V. Tach Reported 8 runs of V. Tach on 11/14/19, non sustained and asymptomatic  Diabetes Mellitus type 2 -11/01/19 A1c--5.0 -no on any agents as outpt -hypoglycemic epidoses improved on IV dextrose infusion -now full comfort care  Dilated SB loops -likely ileus, not clinically obstructed -6/29 CT abd/pelv--dilated fluid filled SB loops, many lie anterior to stomach. Bibasilar bronchiectasis -passing flatus; had small BM this afternoon -discussed with Dr. Fredda Hammed CT abd/pelv--as above  Chronic respiratory failure with hypoxia -continue duonebs -normally on 3L at home  Pancreatic lesion: -MRCP this admission markedly motion degraded. Will need CT abd with pancreatic protocol in May 2022 as she did not have a good quality study with MRI in setting of motion degradation. 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  No distress or pain noted.  Objective: Vitals:   11/22/19 1700  BP: 98/68  Pulse: 68  SpO2: 90%    Intake/Output Summary (Last 24 hours) at 11/23/2019 1803 Last data filed at 11/22/2019 2000 Gross per 24 hour  Intake 0 ml  Output --  Net 0 ml   Weight change:  Exam:   General:  Pt is resting comfortably; cachexic  HEENT: No neck mass, [place]/AT  Cardiovascular: RRR,  Respiratory: poor inspiratory effort. No wheeze  Abdomen: Soft/+BS,  Extremities: 2+ LE edema,   Data Reviewed: I have personally reviewed following labs and imaging studies Basic Metabolic Panel: Recent Labs  Lab 11/17/19 0439 11/18/19 0408  NA 137 137  K 3.1* 3.2*  CL 106 106  CO2 23 23  GLUCOSE 175* 113*  BUN 16 18  CREATININE 0.93 1.09*  CALCIUM 7.7* 7.3*  MG 1.7 2.1   Liver Function Tests: No results for input(s): AST, ALT, ALKPHOS, BILITOT, PROT, ALBUMIN in the last 168 hours. No results for input(s): LIPASE, AMYLASE in the last 168 hours. No results for input(s): AMMONIA in the last 168 hours. Coagulation Profile: No results for input(s): INR, PROTIME in the last 168 hours. CBC: Recent Labs  Lab 11/17/19 0439  WBC 11.1*  HGB 8.3*  HCT 23.9*  MCV 85.1  PLT 277   Cardiac Enzymes: No results for input(s): CKTOTAL, CKMB, CKMBINDEX, TROPONINI in the last 168 hours. BNP: Invalid input(s): POCBNP CBG: Recent Labs  Lab 11/17/19 1644 11/17/19 2054 11/18/19 0817 11/18/19 1102 11/18/19 1555  GLUCAP 131* 122* 121* 94 91   HbA1C: No results for input(s): HGBA1C in the last 72 hours. Urine analysis:    Component Value Date/Time   COLORURINE YELLOW 08/17/2019 [place] 08/17/2019 1134   LABSPEC 1.006 08/17/2019 1134   PHURINE 7.0 08/17/2019 1134  GLUCOSEU NEGATIVE 08/17/2019 [place] 11/30/2013 1057   [place] 08/17/2019 1134   BILIRUBINUR  NEGATIVE 12/09/2018 0923   [place] 08/17/2019 1134   PROTEINUR NEGATIVE 08/17/2019 1134   UROBILINOGEN 0.2 11/30/2013 1057   NITRITE NEGATIVE 08/17/2019 1134   LEUKOCYTESUR NEGATIVE 08/17/2019 1134   Sepsis Labs: @LABRCNTIP (procalcitonin:4,lacticidven:4) )No results found for this or any previous visit (from the past 240 hour(s)).   Scheduled Meds: . Chlorhexidine Gluconate Cloth  6 each Topical Daily  . glycopyrrolate  0.4 mg Intravenous QID  . latanoprost  1 drop Both Eyes QHS  . metoCLOPramide (REGLAN) injection  10 mg Intravenous TID AC   Continuous Infusions:  Procedures/Studies: CT ABDOMEN WO CONTRAST  Result Date: 11/08/2019 CLINICAL DATA:  Assess anatomy for possible G-tube placement EXAM: CT ABDOMEN WITHOUT CONTRAST TECHNIQUE: Multidetector CT imaging of the abdomen was performed following the standard protocol without IV contrast. COMPARISON:  09/19/2019 FINDINGS: Lower chest: Small bilateral pleural effusions. Coronary artery and aortic calcifications. Bronchiectasis in the lower lobes. Hepatobiliary: No focal hepatic abnormality. Gallbladder unremarkable. Pancreas: No focal abnormality or ductal dilatation. Spleen: No focal abnormality.  Normal size. 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 No hydronephrosis. No renal or adrenal mass. Stomach/Bowel: Small bowel loops are dilated and fluid-filled. Numerous small bowel loops as well as the colon overlie the stomach anteriorly and would make gastrostomy tube placement difficult. Diverticula seen within the colon. Vascular/Lymphatic: Aortic atherosclerosis. No enlarged abdominal lymph nodes. Other: No free fluid or free air. Musculoskeletal: No acute bony abnormality. IMPRESSION: Dilated, fluid-filled small bowel loops. Appearance is concerning for possible small bowel obstruction. Numerous small bowel loops and the colon lie anterior to the stomach and would make  gastrostomy tube placement difficult. Small bilateral pleural effusions.  Bibasilar bronchiectasis. Colonic diverticulosis. Electronically Signed   By: Rolm Baptise M.D.   On: 11/08/2019 19:27   CT CHEST W CONTRAST  Result Date: 11/12/2019 CLINICAL DATA:  Dyspnea, unclear etiology, cough EXAM: CT CHEST, ABDOMEN, AND PELVIS WITH CONTRAST TECHNIQUE: Multidetector CT imaging of the chest, abdomen and pelvis was performed following the standard protocol during bolus administration of intravenous contrast. CONTRAST:  198mL OMNIPAQUE IOHEXOL 300 MG/ML  SOLN COMPARISON:  CT evaluation from April 29, 2018 and November 08, 2019 FINDINGS: CT CHEST FINDINGS Cardiovascular: Heart size is stable. Three-vessel coronary artery disease. No pericardial effusion. Aorta is of normal caliber with calcified and noncalcified plaque similar to the prior study. Central pulmonary arteries mildly dilated otherwise unremarkable on venous phase assessment. Mediastinum/Nodes: Thoracic inlet structures with substernal thyroid extension. Variable attenuation of the substernal thyroid with similar appearance. This measures approximately 2.5 cm greatest axial dimension by 1.6 cm. Esophagus is markedly patulous and fluid-filled which is similar to previous exam. Filled with ingested contrast material. No adenopathy in the mediastinum. No axillary lymphadenopathy. Lungs/Pleura: Signs of interstitial lung disease similar to previous imaging with bronchiectatic changes at the lung bases and subpleural ground-glass and reticulation. Pulmonary emphysema. Small moderate bilateral effusions. Airways are patent. Musculoskeletal: Osteopenia. Spinal degenerative changes. No acute musculoskeletal process. See below for full musculoskeletal detail. CT ABDOMEN PELVIS FINDINGS Hepatobiliary: Liver with mottled appearance on early venous phase. Portal vein is widely patent. No focal lesion in the liver. Gallbladder without pericholecystic stranding with small  gallstones in the lumen. No biliary duct dilation. Pancreas: Small cystic lesions in the pancreas, largest approximately 7 mm. No main duct dilation beyond 3 mm 2 total lesions in the  head of the pancreas. No peripancreatic stranding. Spleen: Spleen normal size without focal lesion. Adrenals/Urinary Tract: Adrenal glands are normal. Marked renal cortical scarring and heterogeneity related to scarring. Small cyst in the interpolar RIGHT kidney. Urinary bladder is markedly distended extending into the low abdomen. 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 Stomach/Bowel: Gastric under distension. Fluid and contrast in the distal esophagus. 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 Colonic diverticulosis. Appendix is normal. Vascular/Lymphatic: No aneurysmal dilation of the abdominal aorta. Scattered atheromatous plaque. No adenopathy. Reproductive: Uterus with mild heterogeneity, nonspecific finding perhaps related to underlying leiomyomata based on calcifications and pattern of enhancement. No adnexal mass. Other: Diffuse anasarca with body wall edema grossly similar to recent imaging no abdominal wall hernia. Musculoskeletal: Spinal degenerative changes without acute or destructive bone process. IMPRESSION: 1. Signs of interstitial lung disease with bronchiectatic changes at the lung bases and subpleural ground-glass and reticulation. 2. Chronic dilation of the esophagus. 3. Bilateral effusions very slightly increased from recent imaging. 4. Markedly distended urinary bladder extending into the low abdomen. Correlate with any signs of bladder outlet obstruction. 5. Diffuse small bowel distension more pronounced in the LEFT and central abdomen similar to the previous exam may represent partial small bowel obstruction or related to ileus and/or partial small-bowel obstruction superimposed on chronic  dilation in the setting of scleroderma perhaps slightly less dilated in the distal small bowel when compared to previous study from November 08, 2019. 6. Heterogeneity of hepatic enhancement in the early phase suggests congestive hepatopathy given that it largely normalizes on later phase. Correlation with hepatic enzymes may be helpful. 7. Small cystic lesions in the pancreas, largest approximately 7 mm. No main duct dilation beyond 3 mm 2 mm total lesions in the head of the pancreas. 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 Follow-up CT or MRI in 1 year is suggested. 8. Diffuse anasarca similar to the prior study. 9. Cholelithiasis. 10. Nodular enlargement of substernal thyroid. Recommend thyroid ultrasound correlation with previously performed thyroid ultrasound(ref: J Am Coll Radiol. 2015 Feb;12(2): 143-50). 11. 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 12. Three-vessel coronary artery disease. 13. Emphysema and aortic atherosclerosis. Aortic Atherosclerosis (ICD10-I70.0) and Emphysema (ICD10-J43.9). Electronically Signed   By: Zetta Bills M.D.   On: 11/12/2019 18:24   NM GASTRIC EMPTYING  Result Date: 11/08/2019 CLINICAL DATA:  Refractory nausea, vomiting and abdominal pain for weeks, weight loss EXAM: NUCLEAR MEDICINE GASTRIC EMPTYING SCAN TECHNIQUE: After oral ingestion of radiolabeled meal, sequential abdominal images were obtained for 120 minutes. Residual percentage of activity remaining within the stomach was calculated at 60 and 120 minutes. Patient was planned to be a 4 hour exam but she terminated the procedure at 2 hours, refusing to complete additional imaging. After oral ingestion of radiolabeled meal, sequential abdominal images were obtained for 4 hours. Percentage of activity emptying the stomach was calculated at 1 hour, 2 hour, 3 hour, and 4 hours. RADIOPHARMACEUTICALS:  2 mCi Tc-6m sulfur colloid in  standardized meal COMPARISON:  12/10/2018 FINDINGS: Expected location of the stomach in the left upper quadrant. Some retained contrast in distal esophagus versus hiatal hernia throughout exam. Ingested meal empties the stomach slowly over the course of the study. 3% emptying at 1 hour. 30% emptying at 2 hours. Findings represent delayed gastric emptying at 2 hours. IMPRESSION: Delayed gastric emptying at 2 hours.  Patient refused completion of the final 2 hours of the 4 hour exam. Electronically Signed   By: Lavonia Dana M.D.   On: 11/08/2019 14:14   CT ABDOMEN PELVIS W CONTRAST  Result Date: 11/12/2019 CLINICAL DATA:  Dyspnea, unclear etiology, cough EXAM: CT CHEST, ABDOMEN, AND PELVIS WITH CONTRAST TECHNIQUE: Multidetector CT imaging of the chest, abdomen and pelvis was performed following the standard protocol during bolus administration of intravenous contrast. CONTRAST:  18mL OMNIPAQUE IOHEXOL 300 MG/ML  SOLN COMPARISON:  CT evaluation from April 29, 2018 and November 08, 2019 FINDINGS: CT CHEST FINDINGS Cardiovascular: Heart size is stable. Three-vessel coronary artery disease. No pericardial effusion. Aorta is of normal caliber with calcified and noncalcified plaque similar to the prior study. Central pulmonary arteries mildly dilated otherwise unremarkable on venous phase assessment. Mediastinum/Nodes: Thoracic inlet structures with substernal thyroid extension. Variable attenuation of the substernal thyroid with similar appearance. This measures approximately 2.5 cm greatest axial dimension by 1.6 cm. Esophagus is markedly patulous and fluid-filled which is similar to previous exam. Filled with ingested contrast material. No adenopathy in the mediastinum. No axillary lymphadenopathy. Lungs/Pleura: Signs of interstitial lung disease similar to previous imaging with bronchiectatic changes at the lung bases and subpleural ground-glass and reticulation. Pulmonary emphysema. Small moderate bilateral effusions.  Airways are patent. Musculoskeletal: Osteopenia. Spinal degenerative changes. No acute musculoskeletal process. See below for full musculoskeletal detail. CT ABDOMEN PELVIS FINDINGS Hepatobiliary: Liver with mottled appearance on early venous phase. Portal vein is widely patent. No focal lesion in the liver. Gallbladder without pericholecystic stranding with small gallstones in the lumen. No biliary duct dilation. Pancreas: Small cystic lesions in the pancreas, largest approximately 7 mm. No main duct dilation beyond 3 mm 2 total lesions in the head of the pancreas. No peripancreatic stranding. Spleen: Spleen normal size without focal lesion. Adrenals/Urinary Tract: Adrenal glands are normal. Marked renal cortical scarring and heterogeneity related to scarring. Small cyst in the interpolar RIGHT kidney. Urinary bladder is markedly distended extending into the low abdomen. 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 Stomach/Bowel: Gastric under distension. Fluid and contrast in the distal esophagus. 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 Colonic diverticulosis. Appendix is normal. Vascular/Lymphatic: No aneurysmal dilation of the abdominal aorta. Scattered atheromatous plaque. No adenopathy. Reproductive: Uterus with mild heterogeneity, nonspecific finding perhaps related to underlying leiomyomata based on calcifications and pattern of enhancement. No adnexal mass. Other: Diffuse anasarca with body wall edema grossly similar to recent imaging no abdominal wall hernia. Musculoskeletal: Spinal degenerative changes without acute or destructive bone process. IMPRESSION: 1. Signs of interstitial lung disease with bronchiectatic changes at the lung bases and subpleural ground-glass and reticulation. 2. Chronic dilation of the esophagus. 3. Bilateral effusions very slightly increased from recent imaging. 4.  Markedly distended urinary bladder extending into the low abdomen. Correlate with any signs of bladder outlet obstruction. 5. Diffuse small bowel distension more pronounced in the LEFT and central abdomen similar to the previous exam may represent partial small bowel obstruction or related to ileus and/or partial small-bowel obstruction superimposed on chronic dilation in the setting of scleroderma perhaps slightly less dilated in the distal small bowel when compared to previous study from November 08, 2019. 6. Heterogeneity of hepatic enhancement in the early phase suggests congestive hepatopathy given that it largely normalizes on later phase. Correlation with hepatic enzymes may be helpful. 7. Small cystic lesions in the pancreas, largest approximately 7  mm. No main duct dilation beyond 3 mm 2 mm total lesions in the head of the pancreas. 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 Follow-up CT or MRI in 1 year is suggested. 8. Diffuse anasarca similar to the prior study. 9. Cholelithiasis. 10. Nodular enlargement of substernal thyroid. Recommend thyroid ultrasound correlation with previously performed thyroid ultrasound(ref: J Am Coll Radiol. 2015 Feb;12(2): 143-50). 11. 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 12. Three-vessel coronary artery disease. 13. Emphysema and aortic atherosclerosis. Aortic Atherosclerosis (ICD10-I70.0) and Emphysema (ICD10-J43.9). Electronically Signed   By: Zetta Bills M.D.   On: 11/12/2019 18:24   MR 3D Recon At Scanner  Result Date: 11/03/2019 CLINICAL DATA:  Cystic lesions in the pancreas seen on CT scan. EXAM: MRI ABDOMEN WITHOUT AND WITH CONTRAST (INCLUDING MRCP) TECHNIQUE: Multiplanar multisequence MR imaging of the abdomen was performed both before and after the administration of intravenous contrast. Heavily T2-weighted images of the biliary and pancreatic ducts were obtained, and  three-dimensional MRCP images were rendered by post processing. CONTRAST:  52mL GADAVIST GADOBUTROL 1 MMOL/ML IV SOLN COMPARISON:  Abdominal CT 09/19/2019 FINDINGS: Markedly motion degraded study. Lower chest: Probable small right pleural effusion. Hepatobiliary: No gross abnormality within the liver parenchyma. Assessment of liver parenchyma on postcontrast imaging is motion degraded. 6 mm probable gallstone noted in the dependent gallbladder lumen. Common bile duct measures 5 mm diameter in the head of pancreas. Pancreas: 7 mm T2 hyperintense focus identified in the head of pancreas, similar to recent CT. This lesion is obscured by motion artifact on postcontrast imaging. The remaining tiny lesions seen in the pancreas on previous CT are also obscured. No dilatation of the main duct. Spleen:  No gross abnormality. Adrenals/Urinary Tract: No gross enhancing adrenal or renal mass lesion. No evidence for hydronephrosis. 13 mm probable cyst upper pole right kidney with scattered tiny T2 hyperintensities in both kidneys too small to characterize but likely benign. Stomach/Bowel: Nondilated. Diffuse distension of fluid-filled bowel seen throughout the abdomen. Small bowel loops measure up to 3.9 cm diameter. Vascular/Lymphatic: No abdominal aortic aneurysm. No discernible lymphadenopathy in the abdomen. Other:  No substantial intraperitoneal free fluid. Musculoskeletal: No suspicious marrow enhancement within the visualized bony anatomy. IMPRESSION: 1. Markedly motion degraded study. Exam is nondiagnostic for small lesions in the solid abdominal organs. 2. 7 mm T2 hyperintense focus in the head of pancreas, similar to recent CT. This lesion is obscured on postcontrast imaging. The remaining tiny lesions seen in the pancreas on previous CT are also obscured by motion artifact. Given patient inability to reproducibly breath hold and remain still, using CT with its better temporal resolution for follow-up is recommended.  For cystic lesions of this size in a patient of this age, consensus guidelines recommend repeat imaging every 2 years. This recommendation follows ACR consensus guidelines: Management of Incidental Pancreatic Cysts: A White Paper of the ACR Incidental Findings Committee. [place] [id number];[id number]. 3. Cholelithiasis. No biliary dilatation. 4. Diffuse distension of fluid-filled bowel loops in the abdomen. Small bowel obstruction cannot be excluded. Electronically Signed   By: Misty Stanley M.D.   On: 11/03/2019 09:23   DG CHEST PORT 1 VIEW  Result Date: 11/12/2019 CLINICAL DATA:  Hypoxia. EXAM: PORTABLE CHEST 1 VIEW COMPARISON:  05/26/2019 FINDINGS: Lungs are hypoinflated without lobar consolidation or effusion. Mild diffuse interstitial prominence is present and slightly more pronounced although this change may related to the degree of hypoinflation. Cardiomediastinal silhouette  and remainder of the exam is unchanged. IMPRESSION: Hypoinflation with mild diffuse interstitial prominence likely chronic. Acute bronchitic process is possible. Electronically Signed   By: Marin Olp M.D.   On: 11/12/2019 08:37   MR ABDOMEN MRCP W WO CONTAST  Result Date: 11/03/2019 CLINICAL DATA:  Cystic lesions in the pancreas seen on CT scan. EXAM: MRI ABDOMEN WITHOUT AND WITH CONTRAST (INCLUDING MRCP) TECHNIQUE: Multiplanar multisequence MR imaging of the abdomen was performed both before and after the administration of intravenous contrast. Heavily T2-weighted images of the biliary and pancreatic ducts were obtained, and three-dimensional MRCP images were rendered by post processing. CONTRAST:  74mL GADAVIST GADOBUTROL 1 MMOL/ML IV SOLN COMPARISON:  Abdominal CT 09/19/2019 FINDINGS: Markedly motion degraded study. Lower chest: Probable small right pleural effusion. Hepatobiliary: No gross abnormality within the liver parenchyma. Assessment of liver parenchyma on postcontrast imaging is motion degraded. 6 mm probable  gallstone noted in the dependent gallbladder lumen. Common bile duct measures 5 mm diameter in the head of pancreas. Pancreas: 7 mm T2 hyperintense focus identified in the head of pancreas, similar to recent CT. This lesion is obscured by motion artifact on postcontrast imaging. The remaining tiny lesions seen in the pancreas on previous CT are also obscured. No dilatation of the main duct. Spleen:  No gross abnormality. Adrenals/Urinary Tract: No gross enhancing adrenal or renal mass lesion. No evidence for hydronephrosis. 13 mm probable cyst upper pole right kidney with scattered tiny T2 hyperintensities in both kidneys too small to characterize but likely benign. Stomach/Bowel: Nondilated. Diffuse distension of fluid-filled bowel seen throughout the abdomen. Small bowel loops measure up to 3.9 cm diameter. Vascular/Lymphatic: No abdominal aortic aneurysm. No discernible lymphadenopathy in the abdomen. Other:  No substantial intraperitoneal free fluid. Musculoskeletal: No suspicious marrow enhancement within the visualized bony anatomy. IMPRESSION: 1. Markedly motion degraded study. Exam is nondiagnostic for small lesions in the solid abdominal organs. 2. 7 mm T2 hyperintense focus in the head of pancreas, similar to recent CT. This lesion is obscured on postcontrast imaging. The remaining tiny lesions seen in the pancreas on previous CT are also obscured by motion artifact. Given patient inability to reproducibly breath hold and remain still, using CT with its better temporal resolution for follow-up is recommended. For cystic lesions of this size in a patient of this age, consensus guidelines recommend repeat imaging every 2 years. This recommendation follows ACR consensus guidelines: Management of Incidental Pancreatic Cysts: A White Paper of the ACR Incidental Findings Committee. [place] [id number]. 3. Cholelithiasis. No biliary dilatation. 4. Diffuse distension of fluid-filled bowel loops in the  abdomen. Small bowel obstruction cannot be excluded. Electronically Signed   By: Misty Stanley M.D.   On: 11/03/2019 09:23    Orson Eva, DO  [facility]  If 7PM-7AM, please contact night-coverage [email or web address] Password [email or web address] 11/23/2019, 6:03 PM   LOS: 2 days

## 2019-11-24 NOTE — Progress Notes (Signed)
PROGRESS NOTE  [name] ZJQ:[id number] DOB: Dec 23, 1950 DOA: 12/03/2019 PCP: Maryruth Hancock, MD  Brief History:  69 y.o.femalewith medical history significant forCOPD asthma with chronic respiratory failure on 3 L, paroxysmal atrial fibrillation,HOCM,liver cirrhosis, diabetes mellitus. Patient was sent to the ED via EMS from her endocrinologist'soffice with blood sugar of 32,she was given D50 and blood sugar increased to 47, but on arrival to the ED, her blood sugar was 20.Patient has hadprofound weight loss of greater than 150 pounds in the past 2 years (weight stable last 6 months), dysphagia to solids/pills, recent diagnosis of scleroderma.  Over the past 3 months she has been vomiting every day, at least 2-3 times a day, she has been vomiting almostevery thing shetries to eat,withresulted in weight loss. She reports chronic intermittent abdominal pain. But she has no abdominal painthis admission. No loose stools. Patient is not on any medications for her diabetes.  She has chronic lower extremity swelling that that fluctuates but is currently unchanged,no chest pain, no difficulty breathing.She continued to having vomiting during this admission despite conservative measures. IR did not feel her anatomy was conducive for PEG. General surgery has been consulted to place open gastrostomy tube. Palliative medicine was consulted to discuss [place].  After numerous discussions with palliative medicine and general surgery, the patient and family ultimately declined to have gastrostomy tube placement.  Given her significant decline and poor prognosis palliative medicine recommended full comfort care at this time. The family are appropriately sad and tearful but have good understanding and agree with comfort care. She was transitioned to full comfort care on 11/18/19.  [facility].  Since there were no beds available, the patient was  transitioned to general inpatient hospice admission.  Palliative medicine recommended to stop dextrose infusion They felt this is a life support that is prolonging her life without quality as she is unable to awaken to have meaningful interactions at this stage.  Assessment/Plan: Hypoglycemia/FTT/Intractable Nausea/vomiting -Dr. Roderic Palau discussed with endocrineDr. Dorris Fetch. Felt that hypoglycemia is related to severe malnutrition and depleted glycogen stores. Recommendations were to keep the patient on dextrose infusion while in the hospital. Also, keep gloves on patient's hands to help keep them warm. This may improve accuracy of CBG. He recommended holding off on further work-up for insulinoma in current setting since results may be skewed. This will be further followed up as an outpatient. Overall blood sugars are better with dextrose infusion.Need to ensure that patient is getting adequate enteral nutrition prior to discontinuing dextrose infusion. -continue D5/0.9NS-->ultimately stopped as discussed above as patient was transitioned to full comfort care  Intractable nausea/vomiting -worsen last 3 months -related to severe reflux esophagitis and gastroparesis -05/19/19--EGD--severe reflux esophagitis -GES--confirms gastroparesis -reglan increased to 7.5 mg tid -GI following and assisting with further work-up -Dr. Laural Golden agrees with PEG/PEJ -IR consulted-->anatomy not conducive for PEC -pt continues to vomiting and having difficulty tolerating clear liquids; many times she is "spitting up" -consult general surgery--planning open G-tube7/7 -7/4--case discussed with Dr. Constance Haw -repeat CT abd/pelv--Diffuse small bowel distension more pronounced in the LEFT and central abdomen similar to the previous exam; diffuse anasarca -clinically not obstructed -7/3 CT chest--Signs of interstitial lung disease with bronchiectatic changes at lung bases and subpleural ground-glass and  reticulation -Her nausea has improved and she has been making efforts to eat on her own.  -ultimately Declines PEG tube placement.  Hypertrophic CM -appreciate cardiology consult-->From a purely cardiac  standpoint there is no contraindication to her surgery, defer considerations regarding her noncardiac comorbidities to primary team. No additional cardaic testing planned at this time. -now full comfort care  Nonsustained asymptomatic V. Tach Reported 8 runs of V. Tach on 11/14/19, non sustained and asymptomatic  Diabetes Mellitus type 2 -11/01/19 A1c--5.0 -no on any agents as outpt -hypoglycemic epidoses improved on IV dextrose infusion -now full comfort care  Dilated SB loops -likely ileus, not clinically obstructed -6/29 CT abd/pelv--dilated fluid filled SB loops, many lie anterior to stomach. Bibasilar bronchiectasis -passing flatus; had small BM this afternoon -discussed with Dr. Fredda Hammed CT abd/pelv--as above  Chronic respiratory failure with hypoxia -continue duonebs -normally on 3L at home  Pancreatic lesion: -MRCP this admission markedly motion degraded. Will need CT abd with pancreatic protocol in May 2022 as she did not have a good quality study with MRI in setting of motion degradation. 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  No reports of cp, sob, vomiting, uncontrolled pain  Objective: Vitals:   11/22/19 1700 11/24/19 1750  BP: 98/68 (!) 61/44  Pulse: 68 (!) 120  Temp:  98.8 F (37.1 C)  TempSrc:  Axillary  SpO2: 90%    No intake or output data in the 24 hours ending 11/24/19 1803 Weight change:  Exam:   General:  Pt is not in acute distress  HEENT:  [place]/AT  Cardiovascular: RRR, S1/S2  Respiratory: poor inspiratory effort  Abdomen: Soft/+BS  Extremities: + edema,    Data Reviewed: I have personally reviewed following labs and imaging studies Basic Metabolic Panel: Recent Labs  Lab 11/18/19 0408  NA 137  K 3.2*  CL 106  CO2 23  GLUCOSE 113*  BUN 18  CREATININE 1.09*  CALCIUM 7.3*  MG 2.1   Liver Function Tests: No results for input(s): AST, ALT, ALKPHOS, BILITOT, PROT, ALBUMIN in the last 168 hours. No results for input(s): LIPASE, AMYLASE in the last 168 hours. No results for input(s): AMMONIA in the last 168 hours. Coagulation Profile: No results for input(s): INR, PROTIME in the last 168 hours. CBC: No results for input(s): WBC, NEUTROABS, HGB, HCT, MCV, PLT in the last 168 hours. Cardiac Enzymes: No results for input(s): CKTOTAL, CKMB, CKMBINDEX, TROPONINI in the last 168 hours. BNP: Invalid input(s): POCBNP CBG: Recent Labs  Lab 11/17/19 2054 11/18/19 0817 11/18/19 1102 11/18/19 1555  GLUCAP 122* 121* 94 91   HbA1C: No results for input(s): HGBA1C in the last 72 hours. Urine analysis:    Component Value Date/Time   COLORURINE YELLOW 08/17/2019 [place] 08/17/2019 1134   LABSPEC 1.006 08/17/2019 [place] 7.0 08/17/2019 1134   GLUCOSEU NEGATIVE 08/17/2019 1134   GLUCOSEU NEGATIVE 11/30/2013 1057   HGBUR NEGATIVE 08/17/2019 1134   [place] 12/09/2018 [place] 08/17/2019 1134   PROTEINUR  NEGATIVE 08/17/2019  1134   UROBILINOGEN 0.2 11/30/2013 1057   NITRITE NEGATIVE 08/17/2019 1134   LEUKOCYTESUR NEGATIVE 08/17/2019 1134   Sepsis Labs: @LABRCNTIP (procalcitonin:4,lacticidven:4) )No results found for this or any previous visit (from the past 240 hour(s)).   Scheduled Meds:  Chlorhexidine Gluconate Cloth  6 each Topical Daily   glycopyrrolate  0.4 mg Intravenous QID   latanoprost  1 drop Both Eyes QHS   metoCLOPramide (REGLAN) injection  10 mg Intravenous TID AC   Continuous Infusions:  Procedures/Studies: CT ABDOMEN WO CONTRAST  Result Date: 11/08/2019 CLINICAL DATA:  Assess anatomy for possible G-tube placement EXAM: CT ABDOMEN WITHOUT CONTRAST TECHNIQUE: Multidetector CT imaging of the abdomen was performed following the standard protocol without IV contrast. COMPARISON:  09/19/2019 FINDINGS: Lower chest: Small bilateral pleural effusions. Coronary artery and aortic calcifications. Bronchiectasis in the lower lobes. Hepatobiliary: No focal hepatic abnormality. Gallbladder unremarkable. Pancreas: No focal abnormality or ductal dilatation. Spleen: No focal abnormality.  Normal size. 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 No hydronephrosis. No renal or adrenal mass. Stomach/Bowel: Small bowel loops are dilated and fluid-filled. Numerous small bowel loops as well as the colon overlie the stomach anteriorly and would make gastrostomy tube placement difficult. Diverticula seen within the colon. Vascular/Lymphatic: Aortic atherosclerosis. No enlarged abdominal lymph nodes. Other: No free fluid or free air. Musculoskeletal: No acute bony abnormality. IMPRESSION: Dilated, fluid-filled small bowel loops. Appearance is concerning for possible small bowel obstruction. Numerous small bowel loops and the colon lie anterior to the stomach and would make gastrostomy tube placement difficult. Small bilateral pleural effusions.  Bibasilar  bronchiectasis. Colonic diverticulosis. Electronically Signed   By: Rolm Baptise M.D.   On: 11/08/2019 19:27   CT CHEST W CONTRAST  Result Date: 11/12/2019 CLINICAL DATA:  Dyspnea, unclear etiology, cough EXAM: CT CHEST, ABDOMEN, AND PELVIS WITH CONTRAST TECHNIQUE: Multidetector CT imaging of the chest, abdomen and pelvis was performed following the standard protocol during bolus administration of intravenous contrast. CONTRAST:  165mL OMNIPAQUE IOHEXOL 300 MG/ML  SOLN COMPARISON:  CT evaluation from April 29, 2018 and November 08, 2019 FINDINGS: CT CHEST FINDINGS Cardiovascular: Heart size is stable. Three-vessel coronary artery disease. No pericardial effusion. Aorta is of normal caliber with calcified and noncalcified plaque similar to the prior study. Central pulmonary arteries mildly dilated otherwise unremarkable on venous phase assessment. Mediastinum/Nodes: Thoracic inlet structures with substernal thyroid extension. Variable attenuation of the substernal thyroid with similar appearance. This measures approximately 2.5 cm greatest axial dimension by 1.6 cm. Esophagus is markedly patulous and fluid-filled which is similar to previous exam. Filled with ingested contrast material. No adenopathy in the mediastinum. No axillary lymphadenopathy. Lungs/Pleura: Signs of interstitial lung disease similar to previous imaging with bronchiectatic changes at the lung bases and subpleural ground-glass and reticulation. Pulmonary emphysema. Small moderate bilateral effusions. Airways are patent. Musculoskeletal: Osteopenia. Spinal degenerative changes. No acute musculoskeletal process. See below for full musculoskeletal detail. CT ABDOMEN PELVIS FINDINGS Hepatobiliary: Liver with mottled appearance on early venous phase. Portal vein is widely patent. No focal lesion in the liver. Gallbladder without pericholecystic stranding with small gallstones in the lumen. No biliary duct dilation. Pancreas: Small cystic lesions in  the pancreas, largest approximately 7 mm. No main duct dilation beyond 3 mm 2 total lesions in the head of the pancreas. No peripancreatic stranding. Spleen: Spleen normal size without focal lesion. Adrenals/Urinary Tract: Adrenal glands are normal. Marked renal cortical scarring and heterogeneity related to scarring. Small cyst  in the interpolar RIGHT kidney. Urinary bladder is markedly distended extending into the low abdomen. 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 Stomach/Bowel: Gastric under distension. Fluid and contrast in the distal esophagus. 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 Colonic diverticulosis. Appendix is normal. Vascular/Lymphatic: No aneurysmal dilation of the abdominal aorta. Scattered atheromatous plaque. No adenopathy. Reproductive: Uterus with mild heterogeneity, nonspecific finding perhaps related to underlying leiomyomata based on calcifications and pattern of enhancement. No adnexal mass. Other: Diffuse anasarca with body wall edema grossly similar to recent imaging no abdominal wall hernia. Musculoskeletal: Spinal degenerative changes without acute or destructive bone process. IMPRESSION: 1. Signs of interstitial lung disease with bronchiectatic changes at the lung bases and subpleural ground-glass and reticulation. 2. Chronic dilation of the esophagus. 3. Bilateral effusions very slightly increased from recent imaging. 4. Markedly distended urinary bladder extending into the low abdomen. Correlate with any signs of bladder outlet obstruction. 5. Diffuse small bowel distension more pronounced in the LEFT and central abdomen similar to the previous exam may represent partial small bowel obstruction or related to ileus and/or partial small-bowel obstruction superimposed on chronic dilation in the setting of scleroderma perhaps slightly less dilated in the distal small  bowel when compared to previous study from November 08, 2019. 6. Heterogeneity of hepatic enhancement in the early phase suggests congestive hepatopathy given that it largely normalizes on later phase. Correlation with hepatic enzymes may be helpful. 7. Small cystic lesions in the pancreas, largest approximately 7 mm. No main duct dilation beyond 3 mm 2 mm total lesions in the head of the pancreas. 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 Follow-up CT or MRI in 1 year is suggested. 8. Diffuse anasarca similar to the prior study. 9. Cholelithiasis. 10. Nodular enlargement of substernal thyroid. Recommend thyroid ultrasound correlation with previously performed thyroid ultrasound(ref: J Am Coll Radiol. 2015 Feb;12(2): 143-50). 11. 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 12. Three-vessel coronary artery disease. 13. Emphysema and aortic atherosclerosis. Aortic Atherosclerosis (ICD10-I70.0) and Emphysema (ICD10-J43.9). Electronically Signed   By: Zetta Bills M.D.   On: 11/12/2019 18:24   NM GASTRIC EMPTYING  Result Date: 11/08/2019 CLINICAL DATA:  Refractory nausea, vomiting and abdominal pain for weeks, weight loss EXAM: NUCLEAR MEDICINE GASTRIC EMPTYING SCAN TECHNIQUE: After oral ingestion of radiolabeled meal, sequential abdominal images were obtained for 120 minutes. Residual percentage of activity remaining within the stomach was calculated at 60 and 120 minutes. Patient was planned to be a 4 hour exam but she terminated the procedure at 2 hours, refusing to complete additional imaging. After oral ingestion of radiolabeled meal, sequential abdominal images were obtained for 4 hours. Percentage of activity emptying the stomach was calculated at 1 hour, 2 hour, 3 hour, and 4 hours. RADIOPHARMACEUTICALS:  2 mCi Tc-5m sulfur colloid in standardized meal COMPARISON:  12/10/2018 FINDINGS: Expected location of the stomach in  the left upper quadrant. Some retained contrast in distal esophagus versus hiatal hernia throughout exam. Ingested meal empties the stomach slowly over the course of the study. 3% emptying at 1 hour. 30% emptying at 2 hours. Findings represent delayed gastric emptying at 2 hours. IMPRESSION: Delayed gastric emptying at 2 hours. Patient refused completion of the final 2 hours of the 4 hour exam. Electronically Signed   By: Lavonia Dana M.D.   On: 11/08/2019 14:14   CT ABDOMEN  PELVIS W CONTRAST  Result Date: 11/12/2019 CLINICAL DATA:  Dyspnea, unclear etiology, cough EXAM: CT CHEST, ABDOMEN, AND PELVIS WITH CONTRAST TECHNIQUE: Multidetector CT imaging of the chest, abdomen and pelvis was performed following the standard protocol during bolus administration of intravenous contrast. CONTRAST:  184mL OMNIPAQUE IOHEXOL 300 MG/ML  SOLN COMPARISON:  CT evaluation from April 29, 2018 and November 08, 2019 FINDINGS: CT CHEST FINDINGS Cardiovascular: Heart size is stable. Three-vessel coronary artery disease. No pericardial effusion. Aorta is of normal caliber with calcified and noncalcified plaque similar to the prior study. Central pulmonary arteries mildly dilated otherwise unremarkable on venous phase assessment. Mediastinum/Nodes: Thoracic inlet structures with substernal thyroid extension. Variable attenuation of the substernal thyroid with similar appearance. This measures approximately 2.5 cm greatest axial dimension by 1.6 cm. Esophagus is markedly patulous and fluid-filled which is similar to previous exam. Filled with ingested contrast material. No adenopathy in the mediastinum. No axillary lymphadenopathy. Lungs/Pleura: Signs of interstitial lung disease similar to previous imaging with bronchiectatic changes at the lung bases and subpleural ground-glass and reticulation. Pulmonary emphysema. Small moderate bilateral effusions. Airways are patent. Musculoskeletal: Osteopenia. Spinal degenerative changes. No acute  musculoskeletal process. See below for full musculoskeletal detail. CT ABDOMEN PELVIS FINDINGS Hepatobiliary: Liver with mottled appearance on early venous phase. Portal vein is widely patent. No focal lesion in the liver. Gallbladder without pericholecystic stranding with small gallstones in the lumen. No biliary duct dilation. Pancreas: Small cystic lesions in the pancreas, largest approximately 7 mm. No main duct dilation beyond 3 mm 2 total lesions in the head of the pancreas. No peripancreatic stranding. Spleen: Spleen normal size without focal lesion. Adrenals/Urinary Tract: Adrenal glands are normal. Marked renal cortical scarring and heterogeneity related to scarring. Small cyst in the interpolar RIGHT kidney. Urinary bladder is markedly distended extending into the low abdomen. 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 Stomach/Bowel: Gastric under distension. Fluid and contrast in the distal esophagus. 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 Colonic diverticulosis. Appendix is normal. Vascular/Lymphatic: No aneurysmal dilation of the abdominal aorta. Scattered atheromatous plaque. No adenopathy. Reproductive: Uterus with mild heterogeneity, nonspecific finding perhaps related to underlying leiomyomata based on calcifications and pattern of enhancement. No adnexal mass. Other: Diffuse anasarca with body wall edema grossly similar to recent imaging no abdominal wall hernia. Musculoskeletal: Spinal degenerative changes without acute or destructive bone process. IMPRESSION: 1. Signs of interstitial lung disease with bronchiectatic changes at the lung bases and subpleural ground-glass and reticulation. 2. Chronic dilation of the esophagus. 3. Bilateral effusions very slightly increased from recent imaging. 4. Markedly distended urinary bladder extending into the low abdomen. Correlate with any  signs of bladder outlet obstruction. 5. Diffuse small bowel distension more pronounced in the LEFT and central abdomen similar to the previous exam may represent partial small bowel obstruction or related to ileus and/or partial small-bowel obstruction superimposed on chronic dilation in the setting of scleroderma perhaps slightly less dilated in the distal small bowel when compared to previous study from November 08, 2019. 6. Heterogeneity of hepatic enhancement in the early phase suggests congestive hepatopathy given that it largely normalizes on later phase. Correlation with hepatic enzymes may be helpful. 7. Small cystic lesions in the pancreas, largest approximately 7 mm. No main duct dilation beyond 3 mm 2 mm total lesions in the head of the pancreas. 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 Follow-up CT or  MRI in 1 year is suggested. 8. Diffuse anasarca similar to the prior study. 9. Cholelithiasis. 10. Nodular enlargement of substernal thyroid. Recommend thyroid ultrasound correlation with previously performed thyroid ultrasound(ref: J Am Coll Radiol. 2015 Feb;12(2): 143-50). 11. 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 12. Three-vessel coronary artery disease. 13. Emphysema and aortic atherosclerosis. Aortic Atherosclerosis (ICD10-I70.0) and Emphysema (ICD10-J43.9). Electronically Signed   By: Zetta Bills M.D.   On: 11/12/2019 18:24   MR 3D Recon At Scanner  Result Date: 11/03/2019 CLINICAL DATA:  Cystic lesions in the pancreas seen on CT scan. EXAM: MRI ABDOMEN WITHOUT AND WITH CONTRAST (INCLUDING MRCP) TECHNIQUE: Multiplanar multisequence MR imaging of the abdomen was performed both before and after the administration of intravenous contrast. Heavily T2-weighted images of the biliary and pancreatic ducts were obtained, and three-dimensional MRCP images were rendered by post processing. CONTRAST:  77mL GADAVIST  GADOBUTROL 1 MMOL/ML IV SOLN COMPARISON:  Abdominal CT 09/19/2019 FINDINGS: Markedly motion degraded study. Lower chest: Probable small right pleural effusion. Hepatobiliary: No gross abnormality within the liver parenchyma. Assessment of liver parenchyma on postcontrast imaging is motion degraded. 6 mm probable gallstone noted in the dependent gallbladder lumen. Common bile duct measures 5 mm diameter in the head of pancreas. Pancreas: 7 mm T2 hyperintense focus identified in the head of pancreas, similar to recent CT. This lesion is obscured by motion artifact on postcontrast imaging. The remaining tiny lesions seen in the pancreas on previous CT are also obscured. No dilatation of the main duct. Spleen:  No gross abnormality. Adrenals/Urinary Tract: No gross enhancing adrenal or renal mass lesion. No evidence for hydronephrosis. 13 mm probable cyst upper pole right kidney with scattered tiny T2 hyperintensities in both kidneys too small to characterize but likely benign. Stomach/Bowel: Nondilated. Diffuse distension of fluid-filled bowel seen throughout the abdomen. Small bowel loops measure up to 3.9 cm diameter. Vascular/Lymphatic: No abdominal aortic aneurysm. No discernible lymphadenopathy in the abdomen. Other:  No substantial intraperitoneal free fluid. Musculoskeletal: No suspicious marrow enhancement within the visualized bony anatomy. IMPRESSION: 1. Markedly motion degraded study. Exam is nondiagnostic for small lesions in the solid abdominal organs. 2. 7 mm T2 hyperintense focus in the head of pancreas, similar to recent CT. This lesion is obscured on postcontrast imaging. The remaining tiny lesions seen in the pancreas on previous CT are also obscured by motion artifact. Given patient inability to reproducibly breath hold and remain still, using CT with its better temporal resolution for follow-up is recommended. For cystic lesions of this size in a patient of this age, consensus guidelines recommend  repeat imaging every 2 years. This recommendation follows ACR consensus guidelines: Management of Incidental Pancreatic Cysts: A White Paper of the ACR Incidental Findings Committee. [place] 1749;44:[id number]. 3. Cholelithiasis. No biliary dilatation. 4. Diffuse distension of fluid-filled bowel loops in the abdomen. Small bowel obstruction cannot be excluded. Electronically Signed   By: Misty Stanley M.D.   On: 11/03/2019 09:23   DG CHEST PORT 1 VIEW  Result Date: 11/12/2019 CLINICAL DATA:  Hypoxia. EXAM: PORTABLE CHEST 1 VIEW COMPARISON:  05/26/2019 FINDINGS: Lungs are hypoinflated without lobar consolidation or effusion. Mild diffuse interstitial prominence is present and slightly more pronounced although this change may related to the degree of hypoinflation. Cardiomediastinal silhouette and remainder of the exam is unchanged. IMPRESSION: Hypoinflation with mild diffuse interstitial prominence likely chronic. Acute bronchitic process is possible. Electronically Signed   By: Marin Olp M.D.  On: 11/12/2019 08:37   MR ABDOMEN MRCP W WO CONTAST  Result Date: 11/03/2019 CLINICAL DATA:  Cystic lesions in the pancreas seen on CT scan. EXAM: MRI ABDOMEN WITHOUT AND WITH CONTRAST (INCLUDING MRCP) TECHNIQUE: Multiplanar multisequence MR imaging of the abdomen was performed both before and after the administration of intravenous contrast. Heavily T2-weighted images of the biliary and pancreatic ducts were obtained, and three-dimensional MRCP images were rendered by post processing. CONTRAST:  38mL GADAVIST GADOBUTROL 1 MMOL/ML IV SOLN COMPARISON:  Abdominal CT 09/19/2019 FINDINGS: Markedly motion degraded study. Lower chest: Probable small right pleural effusion. Hepatobiliary: No gross abnormality within the liver parenchyma. Assessment of liver parenchyma on postcontrast imaging is motion degraded. 6 mm probable gallstone noted in the dependent gallbladder lumen. Common bile duct measures 5 mm diameter  in the head of pancreas. Pancreas: 7 mm T2 hyperintense focus identified in the head of pancreas, similar to recent CT. This lesion is obscured by motion artifact on postcontrast imaging. The remaining tiny lesions seen in the pancreas on previous CT are also obscured. No dilatation of the main duct. Spleen:  No gross abnormality. Adrenals/Urinary Tract: No gross enhancing adrenal or renal mass lesion. No evidence for hydronephrosis. 13 mm probable cyst upper pole right kidney with scattered tiny T2 hyperintensities in both kidneys too small to characterize but likely benign. Stomach/Bowel: Nondilated. Diffuse distension of fluid-filled bowel seen throughout the abdomen. Small bowel loops measure up to 3.9 cm diameter. Vascular/Lymphatic: No abdominal aortic aneurysm. No discernible lymphadenopathy in the abdomen. Other:  No substantial intraperitoneal free fluid. Musculoskeletal: No suspicious marrow enhancement within the visualized bony anatomy. IMPRESSION: 1. Markedly motion degraded study. Exam is nondiagnostic for small lesions in the solid abdominal organs. 2. 7 mm T2 hyperintense focus in the head of pancreas, similar to recent CT. This lesion is obscured on postcontrast imaging. The remaining tiny lesions seen in the pancreas on previous CT are also obscured by motion artifact. Given patient inability to reproducibly breath hold and remain still, using CT with its better temporal resolution for follow-up is recommended. For cystic lesions of this size in a patient of this age, consensus guidelines recommend repeat imaging every 2 years. This recommendation follows ACR consensus guidelines: Management of Incidental Pancreatic Cysts: A White Paper of the ACR Incidental Findings Committee. [place] [id number]. 3. Cholelithiasis. No biliary dilatation. 4. Diffuse distension of fluid-filled bowel loops in the abdomen. Small bowel obstruction cannot be excluded. Electronically Signed   By: Misty Stanley M.D.   On: 11/03/2019 09:23    Orson Eva, DO  [facility]  If 7PM-7AM, please contact night-coverage [email or web address] Password [email or web address] 11/24/2019, 6:03 PM   LOS: 3 days

## 2019-11-25 DIAGNOSIS — R112 Nausea with vomiting, unspecified: Secondary | ICD-10-CM

## 2019-11-25 DIAGNOSIS — J9611 Chronic respiratory failure with hypoxia: Secondary | ICD-10-CM

## 2019-11-25 DIAGNOSIS — R627 Adult failure to thrive: Secondary | ICD-10-CM

## 2019-12-11 NOTE — Plan of Care (Signed)
Patient expired   Problem: Pain Managment: Goal: General experience of comfort will improve Outcome: Not Met (add Reason)   Problem: Safety: Goal: Ability to remain free from injury will improve Outcome: Not Met (add Reason)   Problem: Skin Integrity: Goal: Risk for impaired skin integrity will decrease Outcome: Not Met (add Reason)   Problem: Education: Goal: Knowledge of General Education information will improve Description: Including pain rating scale, medication(s)/side effects and non-pharmacologic comfort measures Outcome: Not Met (add Reason)   Problem: Health Behavior/Discharge Planning: Goal: Ability to manage health-related needs will improve Outcome: Not Met (add Reason)   Problem: Clinical Measurements: Goal: Ability to maintain clinical measurements within normal limits will improve Outcome: Not Met (add Reason) Goal: Will remain free from infection Outcome: Not Met (add Reason) Goal: Diagnostic test results will improve Outcome: Not Met (add Reason) Goal: Respiratory complications will improve Outcome: Not Met (add Reason) Goal: Cardiovascular complication will be avoided Outcome: Not Met (add Reason)   Problem: Activity: Goal: Risk for activity intolerance will decrease Outcome: Not Met (add Reason)   Problem: Nutrition: Goal: Adequate nutrition will be maintained Outcome: Not Met (add Reason)   Problem: Coping: Goal: Level of anxiety will decrease Outcome: Not Met (add Reason)   Problem: Elimination: Goal: Will not experience complications related to bowel motility Outcome: Not Met (add Reason) Goal: Will not experience complications related to urinary retention Outcome: Not Met (add Reason)   Problem: Pain Managment: Goal: General experience of comfort will improve Outcome: Not Met (add Reason)   Problem: Safety: Goal: Ability to remain free from injury will improve Outcome: Not Met (add Reason)   Problem: Skin Integrity: Goal: Risk for  impaired skin integrity will decrease Outcome: Not Met (add Reason)

## 2019-12-11 NOTE — Death Summary Note (Signed)
DEATH SUMMARY   Patient Details  Name: [name] MRN: [id number] DOB: 05-06-1951  Admission/Discharge Information   Admit Date:  07-Dec-2019  Date of Death: Date of Death: 11-Dec-2019  Time of Death: Time of Death: 1400  Length of Stay: 4  Referring Physician: Maryruth Hancock, MD   Reason(s) for Hospitalization  Intractable vomiting  Diagnoses  Preliminary cause of death: Failure to Thrive Secondary Diagnoses (including complications and co-morbidities):   Hypoglycemia/FTT/Intractable Nausea/vomiting -Dr. Roderic Palau discussed with endocrineDr. Dorris Fetch. Felt that hypoglycemia is related to severe malnutrition and depleted glycogen stores. Recommendations were to keep the patient on dextrose infusion while in the hospital. Also, keep gloves on patient's hands to help keep them warm. This may improve accuracy of CBG. He recommended holding off on further work-up for insulinoma in current setting since results may be skewed. This will be further followed up as an outpatient. Overall blood sugars are better with dextrose infusion.Need to ensure that patient is getting adequate enteral nutrition prior to discontinuing dextrose infusion. -continue D5/0.9NS-->ultimately stopped as discussed above as patient was transitioned to full comfort care  Intractable nausea/vomiting -worsen last 3 months -related to severe reflux esophagitis and gastroparesis -05/19/19--EGD--severe reflux esophagitis -GES--confirms gastroparesis -reglan increased to 7.5 mg tid -GI following and assisting with further work-up -Dr. Laural Golden agrees with PEG/PEJ -IR consulted-->anatomy not conducive for PEC -pt continues to vomiting and having difficulty tolerating clear liquids; many times she is "spitting up" -consult general surgery--planning open G-tube7/7 -7/4--case discussed with Dr. Constance Haw -repeat CT abd/pelv--Diffuse small bowel distension more pronounced in the LEFT and central abdomen similar to the  previous exam; diffuse anasarca -clinically not obstructed -7/3 CT chest--Signs of interstitial lung disease with bronchiectatic changes at lung bases and subpleural ground-glass and reticulation -Her nausea has improved and she has been making efforts to eat on her own. -ultimatelyDeclines PEG tube placement.  Hypertrophic CM -appreciate cardiology consult-->From a purely cardiac standpoint there is no contraindication to her surgery, defer considerations regarding her noncardiac comorbidities to primary team. No additional cardaic testing planned at this time. -now full comfort care  Nonsustained asymptomatic V. Tach Reported 8 runs of V. Tach on 11/14/19, non sustained and asymptomatic  Diabetes Mellitus type 2 -11/01/19 A1c--5.0 -no on any agents as outpt -hypoglycemic epidoses improved on IV dextrose infusion -now full comfort care  Dilated SB loops -likely ileus, not clinically obstructed -6/29 CT abd/pelv--dilated fluid filled SB loops, many lie anterior to stomach. Bibasilar bronchiectasis -passing flatus; had small BM this afternoon -discussed with Dr. Fredda Hammed CT abd/pelv--as above  Chronic respiratory failure with hypoxia -continue duonebs -normally on 3L at home  Pancreatic lesion: -MRCP this admission markedly motion degraded. Will need CT abd with pancreatic protocol in May 2022 as she did not have a good quality study with MRI in setting of motion degradation. -now full comfort care  Essential HTN -continue coreg and HTN -now full comfort  Hypothyroidism -continue home dose synthroid -TSH 10.589 -now full comfort care  Scleroderma -on chronic prednisone  Hepatic steatosis/Anasarca -check urine protein/creatiine ratio--0.12 -continue furosemideIV-->increase to 40 bid -likely due to hypoalbuminemia -now full comfort care  Goals of Care -patient is DNR  Hypokalemia -repleted initially    Brief Hospital Course (including  significant findings, care, treatment, and services provided and events leading to death)  69 y.o.femalewith medical history significant forCOPD asthma with chronic respiratory failure on 3 L, paroxysmal atrial fibrillation,HOCM,liver cirrhosis, diabetes mellitus. Patient was sent to the ED via EMS from her endocrinologist'soffice with  blood sugar of 32,she was given D50 and blood sugar increased to 47, but on arrival to the ED, her blood sugar was 20.Patient has hadprofound weight loss of greater than 150 pounds in the past 2 years (weight stable last 6 months), dysphagia to solids/pills, recent diagnosis of scleroderma.  Over the past 3 months she has been vomiting every day, at least 2-3 times a day, she has been vomiting almostevery thing shetries to eat,withresulted in weight loss. She reports chronic intermittent abdominal pain. But she has no abdominal painthis admission. No loose stools. Patient is not on any medications for her diabetes.  She has chronic lower extremity swelling that that fluctuates but is currently unchanged,no chest pain, no difficulty breathing.She continued to having vomiting during this admission despite conservative measures. IR did not feel her anatomy was conducive for PEG. General surgery has been consulted to place open gastrostomy tube. Palliative medicine was consulted to discuss [place].After numerous discussions with palliative medicine and general surgery, the patient and family ultimately declined to have gastrostomy tube placement.Given her significant decline and poor prognosispalliative medicinerecommended fullcomfort care at this time. The familyare appropriately sad and tearful but have good understanding and agree with comfort care. She was transitioned to full comfort care on 11/18/19. [facility]. Since there were no beds available, the patient was transitioned to general inpatient hospice  admission.  Palliative medicinerecommendedto stop dextrose infusion Theyfeltthis is a life support that is prolonging her life without quality as she is unable to awaken to have meaningful interactions at this stage.   Pertinent Labs and Studies  Significant Diagnostic Studies CT ABDOMEN WO CONTRAST  Result Date: 11/08/2019 CLINICAL DATA:  Assess anatomy for possible G-tube placement EXAM: CT ABDOMEN WITHOUT CONTRAST TECHNIQUE: Multidetector CT imaging of the abdomen was performed following the standard protocol without IV contrast. COMPARISON:  09/19/2019 FINDINGS: Lower chest: Small bilateral pleural effusions. Coronary artery and aortic calcifications. Bronchiectasis in the lower lobes. Hepatobiliary: No focal hepatic abnormality. Gallbladder unremarkable. Pancreas: No focal abnormality or ductal dilatation. Spleen: No focal abnormality.  Normal size. 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 No hydronephrosis. No renal or adrenal mass. Stomach/Bowel: Small bowel loops are dilated and fluid-filled. Numerous small bowel loops as well as the colon overlie the stomach anteriorly and would make gastrostomy tube placement difficult. Diverticula seen within the colon. Vascular/Lymphatic: Aortic atherosclerosis. No enlarged abdominal lymph nodes. Other: No free fluid or free air. Musculoskeletal: No acute bony abnormality. IMPRESSION: Dilated, fluid-filled small bowel loops. Appearance is concerning for possible small bowel obstruction. Numerous small bowel loops and the colon lie anterior to the stomach and would make gastrostomy tube placement difficult. Small bilateral pleural effusions.  Bibasilar bronchiectasis. Colonic diverticulosis. Electronically Signed   By: Rolm Baptise M.D.   On: 11/08/2019 19:27   CT CHEST W CONTRAST  Result Date: 11/12/2019 CLINICAL DATA:  Dyspnea, unclear etiology, cough EXAM: CT CHEST, ABDOMEN, AND PELVIS WITH  CONTRAST TECHNIQUE: Multidetector CT imaging of the chest, abdomen and pelvis was performed following the standard protocol during bolus administration of intravenous contrast. CONTRAST:  186mL OMNIPAQUE IOHEXOL 300 MG/ML  SOLN COMPARISON:  CT evaluation from April 29, 2018 and November 08, 2019 FINDINGS: CT CHEST FINDINGS Cardiovascular: Heart size is stable. Three-vessel coronary artery disease. No pericardial effusion. Aorta is of normal caliber with calcified and noncalcified plaque similar to the prior study. Central pulmonary arteries mildly dilated otherwise unremarkable on venous phase assessment. Mediastinum/Nodes:  Thoracic inlet structures with substernal thyroid extension. Variable attenuation of the substernal thyroid with similar appearance. This measures approximately 2.5 cm greatest axial dimension by 1.6 cm. Esophagus is markedly patulous and fluid-filled which is similar to previous exam. Filled with ingested contrast material. No adenopathy in the mediastinum. No axillary lymphadenopathy. Lungs/Pleura: Signs of interstitial lung disease similar to previous imaging with bronchiectatic changes at the lung bases and subpleural ground-glass and reticulation. Pulmonary emphysema. Small moderate bilateral effusions. Airways are patent. Musculoskeletal: Osteopenia. Spinal degenerative changes. No acute musculoskeletal process. See below for full musculoskeletal detail. CT ABDOMEN PELVIS FINDINGS Hepatobiliary: Liver with mottled appearance on early venous phase. Portal vein is widely patent. No focal lesion in the liver. Gallbladder without pericholecystic stranding with small gallstones in the lumen. No biliary duct dilation. Pancreas: Small cystic lesions in the pancreas, largest approximately 7 mm. No main duct dilation beyond 3 mm 2 total lesions in the head of the pancreas. No peripancreatic stranding. Spleen: Spleen normal size without focal lesion. Adrenals/Urinary Tract: Adrenal glands are normal.  Marked renal cortical scarring and heterogeneity related to scarring. Small cyst in the interpolar RIGHT kidney. Urinary bladder is markedly distended extending into the low abdomen. 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 Stomach/Bowel: Gastric under distension. Fluid and contrast in the distal esophagus. 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 Colonic diverticulosis. Appendix is normal. Vascular/Lymphatic: No aneurysmal dilation of the abdominal aorta. Scattered atheromatous plaque. No adenopathy. Reproductive: Uterus with mild heterogeneity, nonspecific finding perhaps related to underlying leiomyomata based on calcifications and pattern of enhancement. No adnexal mass. Other: Diffuse anasarca with body wall edema grossly similar to recent imaging no abdominal wall hernia. Musculoskeletal: Spinal degenerative changes without acute or destructive bone process. IMPRESSION: 1. Signs of interstitial lung disease with bronchiectatic changes at the lung bases and subpleural ground-glass and reticulation. 2. Chronic dilation of the esophagus. 3. Bilateral effusions very slightly increased from recent imaging. 4. Markedly distended urinary bladder extending into the low abdomen. Correlate with any signs of bladder outlet obstruction. 5. Diffuse small bowel distension more pronounced in the LEFT and central abdomen similar to the previous exam may represent partial small bowel obstruction or related to ileus and/or partial small-bowel obstruction superimposed on chronic dilation in the setting of scleroderma perhaps slightly less dilated in the distal small bowel when compared to previous study from November 08, 2019. 6. Heterogeneity of hepatic enhancement in the early phase suggests congestive hepatopathy given that it largely normalizes on later phase. Correlation with hepatic enzymes may be helpful. 7.  Small cystic lesions in the pancreas, largest approximately 7 mm. No main duct dilation beyond 3 mm 2 mm total lesions in the head of the pancreas. 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 Follow-up CT or MRI in 1 year is suggested. 8. Diffuse anasarca similar to the prior study. 9. Cholelithiasis. 10. Nodular enlargement of substernal thyroid. Recommend thyroid ultrasound correlation with previously performed thyroid ultrasound(ref: J Am Coll Radiol. 2015 Feb;12(2): 143-50). 11. 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 12. Three-vessel coronary artery disease. 13. Emphysema and aortic atherosclerosis. Aortic Atherosclerosis (ICD10-I70.0) and Emphysema (ICD10-J43.9). Electronically Signed   By: Zetta Bills M.D.   On: 11/12/2019 18:24   NM GASTRIC EMPTYING  Result Date: 11/08/2019 CLINICAL DATA:  Refractory nausea, vomiting and abdominal pain for weeks, weight loss EXAM: NUCLEAR MEDICINE  GASTRIC EMPTYING SCAN TECHNIQUE: After oral ingestion of radiolabeled meal, sequential abdominal images were obtained for 120 minutes. Residual percentage of activity remaining within the stomach was calculated at 60 and 120 minutes. Patient was planned to be a 4 hour exam but she terminated the procedure at 2 hours, refusing to complete additional imaging. After oral ingestion of radiolabeled meal, sequential abdominal images were obtained for 4 hours. Percentage of activity emptying the stomach was calculated at 1 hour, 2 hour, 3 hour, and 4 hours. RADIOPHARMACEUTICALS:  2 mCi Tc-68m sulfur colloid in standardized meal COMPARISON:  12/10/2018 FINDINGS: Expected location of the stomach in the left upper quadrant. Some retained contrast in distal esophagus versus hiatal hernia throughout exam. Ingested meal empties the stomach slowly over the course of the study. 3% emptying at 1 hour. 30% emptying at 2 hours. Findings represent  delayed gastric emptying at 2 hours. IMPRESSION: Delayed gastric emptying at 2 hours. Patient refused completion of the final 2 hours of the 4 hour exam. Electronically Signed   By: Lavonia Dana M.D.   On: 11/08/2019 14:14   CT ABDOMEN PELVIS W CONTRAST  Result Date: 11/12/2019 CLINICAL DATA:  Dyspnea, unclear etiology, cough EXAM: CT CHEST, ABDOMEN, AND PELVIS WITH CONTRAST TECHNIQUE: Multidetector CT imaging of the chest, abdomen and pelvis was performed following the standard protocol during bolus administration of intravenous contrast. CONTRAST:  139mL OMNIPAQUE IOHEXOL 300 MG/ML  SOLN COMPARISON:  CT evaluation from April 29, 2018 and November 08, 2019 FINDINGS: CT CHEST FINDINGS Cardiovascular: Heart size is stable. Three-vessel coronary artery disease. No pericardial effusion. Aorta is of normal caliber with calcified and noncalcified plaque similar to the prior study. Central pulmonary arteries mildly dilated otherwise unremarkable on venous phase assessment. Mediastinum/Nodes: Thoracic inlet structures with substernal thyroid extension. Variable attenuation of the substernal thyroid with similar appearance. This measures approximately 2.5 cm greatest axial dimension by 1.6 cm. Esophagus is markedly patulous and fluid-filled which is similar to previous exam. Filled with ingested contrast material. No adenopathy in the mediastinum. No axillary lymphadenopathy. Lungs/Pleura: Signs of interstitial lung disease similar to previous imaging with bronchiectatic changes at the lung bases and subpleural ground-glass and reticulation. Pulmonary emphysema. Small moderate bilateral effusions. Airways are patent. Musculoskeletal: Osteopenia. Spinal degenerative changes. No acute musculoskeletal process. See below for full musculoskeletal detail. CT ABDOMEN PELVIS FINDINGS Hepatobiliary: Liver with mottled appearance on early venous phase. Portal vein is widely patent. No focal lesion in the liver. Gallbladder without  pericholecystic stranding with small gallstones in the lumen. No biliary duct dilation. Pancreas: Small cystic lesions in the pancreas, largest approximately 7 mm. No main duct dilation beyond 3 mm 2 total lesions in the head of the pancreas. No peripancreatic stranding. Spleen: Spleen normal size without focal lesion. Adrenals/Urinary Tract: Adrenal glands are normal. Marked renal cortical scarring and heterogeneity related to scarring. Small cyst in the interpolar RIGHT kidney. Urinary bladder is markedly distended extending into the low abdomen. 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 Stomach/Bowel: Gastric under distension. Fluid and contrast in the distal esophagus. 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 Colonic diverticulosis. Appendix is normal. Vascular/Lymphatic: No aneurysmal dilation of the abdominal aorta. Scattered atheromatous plaque. No adenopathy. Reproductive: Uterus with mild heterogeneity, nonspecific finding perhaps related to underlying leiomyomata based on calcifications and pattern of enhancement. No adnexal mass. Other: Diffuse anasarca with body wall edema grossly similar to recent imaging no abdominal wall  hernia. Musculoskeletal: Spinal degenerative changes without acute or destructive bone process. IMPRESSION: 1. Signs of interstitial lung disease with bronchiectatic changes at the lung bases and subpleural ground-glass and reticulation. 2. Chronic dilation of the esophagus. 3. Bilateral effusions very slightly increased from recent imaging. 4. Markedly distended urinary bladder extending into the low abdomen. Correlate with any signs of bladder outlet obstruction. 5. Diffuse small bowel distension more pronounced in the LEFT and central abdomen similar to the previous exam may represent partial small bowel obstruction or related to ileus and/or partial small-bowel  obstruction superimposed on chronic dilation in the setting of scleroderma perhaps slightly less dilated in the distal small bowel when compared to previous study from November 08, 2019. 6. Heterogeneity of hepatic enhancement in the early phase suggests congestive hepatopathy given that it largely normalizes on later phase. Correlation with hepatic enzymes may be helpful. 7. Small cystic lesions in the pancreas, largest approximately 7 mm. No main duct dilation beyond 3 mm 2 mm total lesions in the head of the pancreas. 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 Follow-up CT or MRI in 1 year is suggested. 8. Diffuse anasarca similar to the prior study. 9. Cholelithiasis. 10. Nodular enlargement of substernal thyroid. Recommend thyroid ultrasound correlation with previously performed thyroid ultrasound(ref: J Am Coll Radiol. 2015 Feb;12(2): 143-50). 11. 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 12. Three-vessel coronary artery disease. 13. Emphysema and aortic atherosclerosis. Aortic Atherosclerosis (ICD10-I70.0) and Emphysema (ICD10-J43.9). Electronically Signed   By: Zetta Bills M.D.   On: 11/12/2019 18:24   MR 3D Recon At Scanner  Result Date: 11/03/2019 CLINICAL DATA:  Cystic lesions in the pancreas seen on CT scan. EXAM: MRI ABDOMEN WITHOUT AND WITH CONTRAST (INCLUDING MRCP) TECHNIQUE: Multiplanar multisequence MR imaging of the abdomen was performed both before and after the administration of intravenous contrast. Heavily T2-weighted images of the biliary and pancreatic ducts were obtained, and three-dimensional MRCP images were rendered by post processing. CONTRAST:  11mL GADAVIST GADOBUTROL 1 MMOL/ML IV SOLN COMPARISON:  Abdominal CT 09/19/2019 FINDINGS: Markedly motion degraded study. Lower chest: Probable small right pleural effusion. Hepatobiliary: No gross abnormality within the liver parenchyma. Assessment of  liver parenchyma on postcontrast imaging is motion degraded. 6 mm probable gallstone noted in the dependent gallbladder lumen. Common bile duct measures 5 mm diameter in the head of pancreas. Pancreas: 7 mm T2 hyperintense focus identified in the head of pancreas, similar to recent CT. This lesion is obscured by motion artifact on postcontrast imaging. The remaining tiny lesions seen in the pancreas on previous CT are also obscured. No dilatation of the main duct. Spleen:  No gross abnormality. Adrenals/Urinary Tract: No gross enhancing adrenal or renal mass lesion. No evidence for hydronephrosis. 13 mm probable cyst upper pole right kidney with scattered tiny T2 hyperintensities in both kidneys too small to characterize but likely benign. Stomach/Bowel: Nondilated. Diffuse distension of fluid-filled bowel seen throughout the abdomen. Small bowel loops measure up to 3.9 cm diameter. Vascular/Lymphatic: No abdominal aortic aneurysm. No discernible lymphadenopathy in the abdomen. Other:  No substantial intraperitoneal free fluid. Musculoskeletal: No suspicious marrow enhancement within the visualized bony anatomy. IMPRESSION: 1. Markedly motion degraded study. Exam is nondiagnostic for small lesions in the solid abdominal organs. 2. 7 mm T2 hyperintense focus in the head of pancreas, similar to recent CT. This lesion is obscured on postcontrast imaging. The remaining tiny lesions seen in the pancreas on previous CT are also  obscured by motion artifact. Given patient inability to reproducibly breath hold and remain still, using CT with its better temporal resolution for follow-up is recommended. For cystic lesions of this size in a patient of this age, consensus guidelines recommend repeat imaging every 2 years. This recommendation follows ACR consensus guidelines: Management of Incidental Pancreatic Cysts: A White Paper of the ACR Incidental Findings Committee. [place] 0165;[id number]. 3. Cholelithiasis. No  biliary dilatation. 4. Diffuse distension of fluid-filled bowel loops in the abdomen. Small bowel obstruction cannot be excluded. Electronically Signed   By: Misty Stanley M.D.   On: 11/03/2019 09:23   DG CHEST PORT 1 VIEW  Result Date: 11/12/2019 CLINICAL DATA:  Hypoxia. EXAM: PORTABLE CHEST 1 VIEW COMPARISON:  05/26/2019 FINDINGS: Lungs are hypoinflated without lobar consolidation or effusion. Mild diffuse interstitial prominence is present and slightly more pronounced although this change may related to the degree of hypoinflation. Cardiomediastinal silhouette and remainder of the exam is unchanged. IMPRESSION: Hypoinflation with mild diffuse interstitial prominence likely chronic. Acute bronchitic process is possible. Electronically Signed   By: Marin Olp M.D.   On: 11/12/2019 08:37   MR ABDOMEN MRCP W WO CONTAST  Result Date: 11/03/2019 CLINICAL DATA:  Cystic lesions in the pancreas seen on CT scan. EXAM: MRI ABDOMEN WITHOUT AND WITH CONTRAST (INCLUDING MRCP) TECHNIQUE: Multiplanar multisequence MR imaging of the abdomen was performed both before and after the administration of intravenous contrast. Heavily T2-weighted images of the biliary and pancreatic ducts were obtained, and three-dimensional MRCP images were rendered by post processing. CONTRAST:  57mL GADAVIST GADOBUTROL 1 MMOL/ML IV SOLN COMPARISON:  Abdominal CT 09/19/2019 FINDINGS: Markedly motion degraded study. Lower chest: Probable small right pleural effusion. Hepatobiliary: No gross abnormality within the liver parenchyma. Assessment of liver parenchyma on postcontrast imaging is motion degraded. 6 mm probable gallstone noted in the dependent gallbladder lumen. Common bile duct measures 5 mm diameter in the head of pancreas. Pancreas: 7 mm T2 hyperintense focus identified in the head of pancreas, similar to recent CT. This lesion is obscured by motion artifact on postcontrast imaging. The remaining tiny lesions seen in the pancreas on  previous CT are also obscured. No dilatation of the main duct. Spleen:  No gross abnormality. Adrenals/Urinary Tract: No gross enhancing adrenal or renal mass lesion. No evidence for hydronephrosis. 13 mm probable cyst upper pole right kidney with scattered tiny T2 hyperintensities in both kidneys too small to characterize but likely benign. Stomach/Bowel: Nondilated. Diffuse distension of fluid-filled bowel seen throughout the abdomen. Small bowel loops measure up to 3.9 cm diameter. Vascular/Lymphatic: No abdominal aortic aneurysm. No discernible lymphadenopathy in the abdomen. Other:  No substantial intraperitoneal free fluid. Musculoskeletal: No suspicious marrow enhancement within the visualized bony anatomy. IMPRESSION: 1. Markedly motion degraded study. Exam is nondiagnostic for small lesions in the solid abdominal organs. 2. 7 mm T2 hyperintense focus in the head of pancreas, similar to recent CT. This lesion is obscured on postcontrast imaging. The remaining tiny lesions seen in the pancreas on previous CT are also obscured by motion artifact. Given patient inability to reproducibly breath hold and remain still, using CT with its better temporal resolution for follow-up is recommended. For cystic lesions of this size in a patient of this age, consensus guidelines recommend repeat imaging every 2 years. This recommendation follows ACR consensus guidelines: Management of Incidental Pancreatic Cysts: A White Paper of the ACR Incidental Findings Committee. Pena [id number];[id number]:492-010. 3. Cholelithiasis. No biliary dilatation. 4. Diffuse distension  of fluid-filled bowel loops in the abdomen. Small bowel obstruction cannot be excluded. Electronically Signed   By: Misty Stanley M.D.   On: 11/03/2019 09:23    Microbiology No results found for this or any previous visit (from the past 240 hour(s)).  Lab Basic Metabolic Panel: No results for input(s): NA, K, CL, CO2, GLUCOSE, BUN, CREATININE, CALCIUM,  MG, PHOS in the last 168 hours. Liver Function Tests: No results for input(s): AST, ALT, ALKPHOS, BILITOT, PROT, ALBUMIN in the last 168 hours. No results for input(s): LIPASE, AMYLASE in the last 168 hours. No results for input(s): AMMONIA in the last 168 hours. CBC: No results for input(s): WBC, NEUTROABS, HGB, HCT, MCV, PLT in the last 168 hours. Cardiac Enzymes: No results for input(s): CKTOTAL, CKMB, CKMBINDEX, TROPONINI in the last 168 hours. Sepsis Labs: No results for input(s): PROCALCITON, WBC, LATICACIDVEN in the last 168 hours.  Procedures/Operations     Shanon Brow Glendora Clouatre 12-12-19, 2:50 PM

## 2019-12-11 DEATH — deceased

## 2019-12-19 ENCOUNTER — Ambulatory Visit: Payer: Medicare Other | Admitting: Gastroenterology

## 2020-03-14 ENCOUNTER — Ambulatory Visit: Payer: Medicare Other | Admitting: Internal Medicine

## 2020-09-19 NOTE — Telephone Encounter (Signed)
error
